# Patient Record
Sex: Male | Born: 1935
Health system: Southern US, Community
[De-identification: ages and names within clinical notes are randomized; demographics above are authoritative.]

## PROBLEM LIST (undated history)

## (undated) DIAGNOSIS — Z9289 Personal history of other medical treatment: Secondary | ICD-10-CM

## (undated) DIAGNOSIS — Z952 Presence of prosthetic heart valve: Secondary | ICD-10-CM

## (undated) DIAGNOSIS — Z87442 Personal history of urinary calculi: Secondary | ICD-10-CM

## (undated) DIAGNOSIS — K5790 Diverticulosis of intestine, part unspecified, without perforation or abscess without bleeding: Secondary | ICD-10-CM

## (undated) DIAGNOSIS — Z298 Encounter for other specified prophylactic measures: Secondary | ICD-10-CM

## (undated) DIAGNOSIS — M47817 Spondylosis without myelopathy or radiculopathy, lumbosacral region: Secondary | ICD-10-CM

## (undated) DIAGNOSIS — M069 Rheumatoid arthritis, unspecified: Secondary | ICD-10-CM

## (undated) DIAGNOSIS — N1832 Chronic kidney disease, stage 3b: Secondary | ICD-10-CM

## (undated) DIAGNOSIS — Z953 Presence of xenogenic heart valve: Secondary | ICD-10-CM

## (undated) DIAGNOSIS — I351 Nonrheumatic aortic (valve) insufficiency: Secondary | ICD-10-CM

## (undated) DIAGNOSIS — Z950 Presence of cardiac pacemaker: Secondary | ICD-10-CM

## (undated) DIAGNOSIS — I2510 Atherosclerotic heart disease of native coronary artery without angina pectoris: Secondary | ICD-10-CM

## (undated) DIAGNOSIS — Z2989 Encounter for other specified prophylactic measures: Secondary | ICD-10-CM

## (undated) DIAGNOSIS — K922 Gastrointestinal hemorrhage, unspecified: Secondary | ICD-10-CM

## (undated) DIAGNOSIS — M12819 Other specific arthropathies, not elsewhere classified, unspecified shoulder: Secondary | ICD-10-CM

## (undated) DIAGNOSIS — K219 Gastro-esophageal reflux disease without esophagitis: Secondary | ICD-10-CM

## (undated) DIAGNOSIS — T8209XA Other mechanical complication of heart valve prosthesis, initial encounter: Secondary | ICD-10-CM

## (undated) DIAGNOSIS — I9789 Other postprocedural complications and disorders of the circulatory system, not elsewhere classified: Secondary | ICD-10-CM

## (undated) DIAGNOSIS — I350 Nonrheumatic aortic (valve) stenosis: Secondary | ICD-10-CM

## (undated) DIAGNOSIS — I10 Essential (primary) hypertension: Secondary | ICD-10-CM

## (undated) DIAGNOSIS — J189 Pneumonia, unspecified organism: Secondary | ICD-10-CM

## (undated) DIAGNOSIS — I509 Heart failure, unspecified: Secondary | ICD-10-CM

## (undated) DIAGNOSIS — D649 Anemia, unspecified: Secondary | ICD-10-CM

## (undated) DIAGNOSIS — R1310 Dysphagia, unspecified: Secondary | ICD-10-CM

## (undated) DIAGNOSIS — D589 Hereditary hemolytic anemia, unspecified: Secondary | ICD-10-CM

## (undated) DIAGNOSIS — K222 Esophageal obstruction: Secondary | ICD-10-CM

## (undated) DIAGNOSIS — M5481 Occipital neuralgia: Secondary | ICD-10-CM

## (undated) DIAGNOSIS — I4892 Unspecified atrial flutter: Secondary | ICD-10-CM

## (undated) DIAGNOSIS — I4891 Unspecified atrial fibrillation: Secondary | ICD-10-CM

## (undated) DIAGNOSIS — I5032 Chronic diastolic (congestive) heart failure: Secondary | ICD-10-CM

## (undated) DIAGNOSIS — K635 Polyp of colon: Secondary | ICD-10-CM

## (undated) DIAGNOSIS — D61818 Other pancytopenia: Secondary | ICD-10-CM

## (undated) DIAGNOSIS — M249 Joint derangement, unspecified: Secondary | ICD-10-CM

## (undated) DIAGNOSIS — I340 Nonrheumatic mitral (valve) insufficiency: Secondary | ICD-10-CM

## (undated) DIAGNOSIS — D696 Thrombocytopenia, unspecified: Secondary | ICD-10-CM

## (undated) HISTORY — PX: LUMBAR LAMINECTOMY: SHX95

## (undated) HISTORY — DX: Presence of cardiac pacemaker: Z95.0

## (undated) HISTORY — PX: APPENDECTOMY: SHX54

## (undated) HISTORY — PX: COLON RESECTION: SHX5231

## (undated) HISTORY — DX: Unspecified atrial fibrillation: I48.91

## (undated) HISTORY — DX: Other postprocedural complications and disorders of the circulatory system, not elsewhere classified: I97.89

## (undated) HISTORY — DX: Unspecified atrial flutter: I48.92

## (undated) HISTORY — PX: CORONARY ANGIOPLASTY: SHX604

## (undated) HISTORY — DX: Atherosclerotic heart disease of native coronary artery without angina pectoris: I25.10

## (undated) HISTORY — DX: Nonrheumatic mitral (valve) insufficiency: I34.0

## (undated) HISTORY — DX: Diverticulosis of intestine, part unspecified, without perforation or abscess without bleeding: K57.90

## (undated) HISTORY — DX: Gastrointestinal hemorrhage, unspecified: K92.2

## (undated) HISTORY — DX: Nonrheumatic aortic (valve) stenosis: I35.0

## (undated) HISTORY — DX: Rheumatoid arthritis, unspecified: M06.9

## (undated) HISTORY — DX: Other mechanical complication of heart valve prosthesis, initial encounter: T82.09XA

## (undated) HISTORY — DX: Encounter for other specified prophylactic measures: Z29.8

## (undated) HISTORY — DX: Esophageal obstruction: K22.2

## (undated) HISTORY — PX: CARDIAC CATHETERIZATION: SHX172

## (undated) HISTORY — DX: Polyp of colon: K63.5

## (undated) HISTORY — PX: OTHER SURGICAL HISTORY: SHX169

## (undated) HISTORY — PX: TONSILLECTOMY: SUR1361

## (undated) HISTORY — PX: CATARACT EXTRACTION W/ INTRAOCULAR LENS  IMPLANT, BILATERAL: SHX1307

## (undated) HISTORY — PX: HERNIA REPAIR: SHX51

## (undated) HISTORY — DX: Occipital neuralgia: M54.81

## (undated) HISTORY — DX: Encounter for other specified prophylactic measures: Z29.89

## (undated) HISTORY — DX: Spondylosis without myelopathy or radiculopathy, lumbosacral region: M47.817

## (undated) HISTORY — DX: Presence of xenogenic heart valve: Z95.3

## (undated) HISTORY — DX: Chronic kidney disease, stage 3b: N18.32

---
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
## 1997-12-25 ENCOUNTER — Ambulatory Visit (HOSPITAL_COMMUNITY): Admission: RE | Admit: 1997-12-25 | Discharge: 1997-12-25 | Payer: Self-pay | Admitting: Neurological Surgery

## 1998-01-13 ENCOUNTER — Ambulatory Visit (HOSPITAL_COMMUNITY): Admission: RE | Admit: 1998-01-13 | Discharge: 1998-01-13 | Payer: Self-pay | Admitting: Neurological Surgery

## 1998-05-06 ENCOUNTER — Ambulatory Visit (HOSPITAL_COMMUNITY): Admission: RE | Admit: 1998-05-06 | Discharge: 1998-05-06 | Payer: Self-pay | Admitting: Neurological Surgery

## 1998-05-24 ENCOUNTER — Inpatient Hospital Stay (HOSPITAL_COMMUNITY): Admission: RE | Admit: 1998-05-24 | Discharge: 1998-05-26 | Payer: Self-pay | Admitting: Neurological Surgery

## 1998-05-24 ENCOUNTER — Encounter: Payer: Self-pay | Admitting: Neurological Surgery

## 1998-10-28 ENCOUNTER — Ambulatory Visit (HOSPITAL_COMMUNITY): Admission: RE | Admit: 1998-10-28 | Discharge: 1998-10-28 | Payer: Self-pay | Admitting: Neurological Surgery

## 1998-10-28 ENCOUNTER — Encounter: Payer: Self-pay | Admitting: Neurological Surgery

## 1999-02-01 ENCOUNTER — Inpatient Hospital Stay (HOSPITAL_COMMUNITY): Admission: RE | Admit: 1999-02-01 | Discharge: 1999-02-04 | Payer: Self-pay | Admitting: Neurological Surgery

## 1999-02-01 ENCOUNTER — Encounter: Payer: Self-pay | Admitting: Neurological Surgery

## 1999-07-22 ENCOUNTER — Encounter: Admission: RE | Admit: 1999-07-22 | Discharge: 1999-07-22 | Payer: Self-pay | Admitting: General Surgery

## 1999-07-22 ENCOUNTER — Encounter: Payer: Self-pay | Admitting: General Surgery

## 1999-07-25 ENCOUNTER — Encounter (INDEPENDENT_AMBULATORY_CARE_PROVIDER_SITE_OTHER): Payer: Self-pay | Admitting: *Deleted

## 1999-07-25 ENCOUNTER — Ambulatory Visit (HOSPITAL_BASED_OUTPATIENT_CLINIC_OR_DEPARTMENT_OTHER): Admission: RE | Admit: 1999-07-25 | Discharge: 1999-07-25 | Payer: Self-pay | Admitting: General Surgery

## 1999-09-07 ENCOUNTER — Encounter: Admission: RE | Admit: 1999-09-07 | Discharge: 1999-09-07 | Payer: Self-pay | Admitting: Neurological Surgery

## 1999-09-07 ENCOUNTER — Encounter: Payer: Self-pay | Admitting: Neurological Surgery

## 2002-02-01 ENCOUNTER — Encounter: Payer: Self-pay | Admitting: Emergency Medicine

## 2002-02-01 ENCOUNTER — Encounter (INDEPENDENT_AMBULATORY_CARE_PROVIDER_SITE_OTHER): Payer: Self-pay | Admitting: Specialist

## 2002-02-02 ENCOUNTER — Encounter: Payer: Self-pay | Admitting: Emergency Medicine

## 2002-02-02 ENCOUNTER — Encounter: Payer: Self-pay | Admitting: General Surgery

## 2002-02-02 ENCOUNTER — Inpatient Hospital Stay (HOSPITAL_COMMUNITY): Admission: EM | Admit: 2002-02-02 | Discharge: 2002-02-11 | Payer: Self-pay | Admitting: Emergency Medicine

## 2002-02-03 ENCOUNTER — Encounter: Payer: Self-pay | Admitting: General Surgery

## 2002-06-18 ENCOUNTER — Inpatient Hospital Stay (HOSPITAL_COMMUNITY): Admission: RE | Admit: 2002-06-18 | Discharge: 2002-06-26 | Payer: Self-pay | Admitting: General Surgery

## 2002-06-18 ENCOUNTER — Encounter (INDEPENDENT_AMBULATORY_CARE_PROVIDER_SITE_OTHER): Payer: Self-pay | Admitting: *Deleted

## 2002-06-23 ENCOUNTER — Encounter: Payer: Self-pay | Admitting: General Surgery

## 2003-07-27 ENCOUNTER — Ambulatory Visit (HOSPITAL_COMMUNITY): Admission: RE | Admit: 2003-07-27 | Discharge: 2003-07-27 | Payer: Self-pay | Admitting: General Surgery

## 2003-07-31 ENCOUNTER — Encounter: Admission: RE | Admit: 2003-07-31 | Discharge: 2003-07-31 | Payer: Self-pay | Admitting: General Surgery

## 2003-09-07 ENCOUNTER — Observation Stay (HOSPITAL_COMMUNITY): Admission: RE | Admit: 2003-09-07 | Discharge: 2003-09-08 | Payer: Self-pay | Admitting: General Surgery

## 2006-01-03 ENCOUNTER — Encounter: Payer: Self-pay | Admitting: Neurological Surgery

## 2008-01-03 HISTORY — PX: CARDIAC VALVE REPLACEMENT: SHX585

## 2008-01-10 ENCOUNTER — Encounter: Admission: RE | Admit: 2008-01-10 | Discharge: 2008-01-10 | Payer: Self-pay | Admitting: Cardiology

## 2008-01-13 ENCOUNTER — Ambulatory Visit: Payer: Self-pay | Admitting: Cardiothoracic Surgery

## 2008-01-13 ENCOUNTER — Encounter: Payer: Self-pay | Admitting: Cardiothoracic Surgery

## 2008-01-14 ENCOUNTER — Inpatient Hospital Stay (HOSPITAL_COMMUNITY): Admission: AD | Admit: 2008-01-14 | Discharge: 2008-01-18 | Payer: Self-pay | Admitting: Cardiology

## 2008-01-14 ENCOUNTER — Encounter: Payer: Self-pay | Admitting: Cardiothoracic Surgery

## 2008-01-16 DIAGNOSIS — Z953 Presence of xenogenic heart valve: Secondary | ICD-10-CM

## 2008-01-16 HISTORY — DX: Presence of xenogenic heart valve: Z95.3

## 2008-02-20 ENCOUNTER — Encounter: Admission: RE | Admit: 2008-02-20 | Discharge: 2008-02-20 | Payer: Self-pay | Admitting: Cardiothoracic Surgery

## 2008-02-20 ENCOUNTER — Ambulatory Visit: Payer: Self-pay | Admitting: Cardiothoracic Surgery

## 2008-05-15 ENCOUNTER — Ambulatory Visit (HOSPITAL_COMMUNITY): Admission: RE | Admit: 2008-05-15 | Discharge: 2008-05-15 | Payer: Self-pay | Admitting: Otolaryngology

## 2011-01-17 NOTE — Discharge Summary (Signed)
Joshua Hudson, TANIMOTO NO.:  [id number]   MEDICAL RECORD NO.:  [id number]          PATIENT TYPE:  INP   LOCATION:  2019                         FACILITY:  [facility]   PHYSICIAN:  Sheliah Plane, MD    DATE OF BIRTH:  06/07/1935   DATE OF ADMISSION:  01/13/2008  DATE OF DISCHARGE:  01/18/2008                               DISCHARGE SUMMARY   HISTORY OF PRESENT ILLNESS:  The patient is a 76 year old white male  with a history of multiple medical conditions including rheumatoid  arthritis, aortic valve stenosis, degenerative disk disease of the  lumbosacral spine, occipital neuralgia, and diverticulosis as well as  hyperlipidemia.  The patient had a known aortic valve problem in the  past.  His last echocardiogram in 2005 showed moderate aortic stenosis.  More recently, a 2-D echocardiogram was done, when he presented to his  primary physician with increasing dyspnea on exertion as well as  profound fatigue limiting his daily activities.  This study showed  severe aortic stenosis with a peak instantaneous gradient of 91 mmHg,  mean gradient of 52 mmHg, and the aortic valve area of 0.8 cm squared.  Left ventricular function was normal with an ejection fraction of 70%.  No significant pulmonary hypertension was noted.  The patient has not  had any syncope, but has had dizzy spells.  Additionally, the patient  did get some symptoms of chest pressure with exertion.  He was referred  to Dr. Armanda Magic for further cardiology evaluation to include cardiac  catheterization.  He was admitted to this hospitalization for the  procedure.   PAST MEDICAL HISTORY:  Includes Seronegative rheumatoid arthritis, non-  nodular, non-erosive, followed by Dr. Coral Spikes.   OTHER DIAGNOSES:  Include:  1. Severe aortic stenosis possible bicuspid aortic valve.  2. Degenerative joint disease of lumbosacral spine.  3. History of occipital neuralgia.  4. History of an adenomatous colon polyp.  5. History of colonic perforation, post colonoscopy.  6. History of diverticulosis.  7. History of mild hyperlipidemia.   PAST SURGICAL HISTORY:  Includes:  1. C3-C4 cervical fusion.  2. Appendectomy.  3. Tonsillectomy.  4. Transurethral resection of the prostate.  5. Cystoscopy with laser incision in the bladder neck.  6. Kidney stone removal in 1997.  7. History of right AC joint shoulder surgery.  8. History of laparotomies following a colon perforation in 2002 with      takedown colostomy in 2003, and a repair of abdominal hernia in      2005.   ALLERGIES:  None.   MEDICATIONS PRIOR TO ADMISSION:  Included:  1. Prednisone 5 mg daily.  2. Methotrexate 15 mg weekly.  3. Folic acid 1 mg daily.  4. Multivitamin 1 daily.  5. Aspirin 81 mg daily.   Family history, social history, review of symptoms, and physical exam,  please see the history and physical done at the time of admission.   HOSPITAL COURSE:  The patient was admitted on Jan 13, 2008 and taken to  the operating room, where he underwent a right heart catheterization  with coronary angiography.  Left heart catheterization was attempted,  but they were unable to cross the aortic valve.  Findings were consistent with:  1. Nonobstructive coronary disease.  2. Severe aortic stenosis by echocardiogram.  3. Normal left ventricular function by echocardiogram.  4. Normal right heart pressures.   The patient was then referred to Sheliah Plane, MD, for thoracic  surgical consultation for consideration of aortic valve replacement.  Dr. Tyrone Sage evaluated the patient and studies and agreed with the  recommendations to proceed.   PROCEDURE:  On Jan 14, 2008, the patient was taken to the operating room  and underwent the following procedure of aortic valve replacement with a  #25 mm [employer] pericardial tissue valve.  The patient  tolerated the procedure and was taken to the surgical intensive care  unit in  stable condition.   POSTOPERATIVE HOSPITAL COURSE:  The patient has overall done well.  He  was weaned from ionotropic support without difficulty.  He was weaned  from the ventilator and extubated uneventfully.  All routine lines were  monitored.  Drainage devices were discontinued in the standard fashion.  Laboratory values did reveal a moderate postoperative anemia.  Most  recent hematocrit dated Jan 17, 2008 was 25.8.  Electrolytes, BUN, and  creatinine are within normal limits.  The patient did have episode of  postoperative atrial fibrillation and has subsequently been chemically  cardioverted back to normal sinus rhythm with amiodarone and Lopressor.  Oxygen has been weaned and he maintains good saturations on room air.  He is tolerating routine advancement activities using standard  protocols.  Incision is healing well without evidence of infection.  Overall, the patient's status is felt to be tentatively stable for  discharge on the morning on Jan 18, 2008, pending morning round  reevaluation.   INSTRUCTIONS:  The patient received written instructions regarding  medications, activity, diet, wound care, and followup.  Followup will  include Dr. Armanda Magic 2 weeks post discharge.  An appointment is also  arranged for Dr. Tyrone Sage on February 20, 2008 at 9:30 with a chest x-ray at  that time.   MEDICATIONS AT DISCHARGE:  As follows:  1. Prednisone 5 mg daily.  2. Folic acid 1 mg daily.  3. Multivitamin 1 daily.  4. Aspirin 325 mg daily.  5. Toprol-XL 25 mg daily.  6. Tylox 1-2 q.6 hours p.r.n. as needed for pain.  7. Amiodarone 400 mg twice daily for 10 days, then once daily.   FINAL DIAGNOSES:  Include the following:  1. Severe aortic stenosis, now status post aortic valve replacement.   As described, other diagnoses include:  1. Postoperative atrial fibrillation.  2. Postoperative acute blood loss anemia.   Previous diagnoses listed including:  1. Rheumatoid arthritis.   2. Degenerative joint disease of the lumbosacral spine.  3. History of occipital neurologia.  4. History of colon polyps.  5. History of colonic perforation.  6. History of diverticulosis.  7. History of mild hyperlipidemia.   Surgeries as previously listed above.      Rowe Clack, P.A.-C.      Sheliah Plane, MD  Electronically Signed    WEG/MEDQ  D:  01/17/2008  T:  01/18/2008  Job:  [id number]   cc:   Armanda Magic, M.D.  Sheliah Plane, MD  Demetria Pore. Coral Spikes, M.D.  Thora Lance, M.D.

## 2011-01-17 NOTE — Consult Note (Signed)
Joshua Hudson, FERTIG NO.:  [id number]   MEDICAL RECORD NO.:  [id number]          PATIENT TYPE:  OIB   LOCATION:  2855                         FACILITY:  [facility]   PHYSICIAN:  Sheliah Plane, MD    DATE OF BIRTH:  [birth year]   DATE OF CONSULTATION:  01/13/2008  DATE OF DISCHARGE:                                 CONSULTATION   REQUESTING PHYSICIAN:  Dr. Mayford Knife.   FOLLOWUP CARDIOLOGIST:  Dr. Mayford Knife.   PRIMARY CARE PHYSICIAN:  Dr. Kirby Funk.   RHEUMATOLOGIST:  Dr. Lennox Pippins.   REASON FOR CONSULTATION:  Severe aortic stenosis with rapidly  progressing presyncope, dyspnea with very minimal exertion, and fatigue.  The patient has had a known murmur of aortic stenosis for years.  Echocardiogram had been done in 2005, but we do not have the results of  this.  The patient notes that probably for at least the last six and  probably 8 months, he has had progressive downhill course with  significant nocturnal dyspnea, now sleeping on this sofa, sitting up  because he is unable to lay flat, increasing episodes of dizziness,  close to syncope, though he has never actually passed out.  He notes, in  January, an echocardiogram was performed at the [facility] office, that  showed severe aortic stenosis with velocity across the aortic valve of  greater than 4 meters per second.  Appointment was made for him to see  Dr. Mayford Knife, on Jan 10, 2008, and the patient was admitted today for  cardiac catheterization and consideration of aortic valve replacement.  He has had no previous myocardial infarction, no previous angioplasty,  or previous cardiac surgery.  The January echo, as noted above, shows  severe aortic stenosis with estimated peak gradient of 91 mm, mean of  52, and aortic valve area 0.8, with preserved LV function.  The patient  has no history of hypertension.  No history of diabetes.  Does have a  history of hyperlipidemia.  Remote history of smoking, but quit in  1995.   FAMILY HISTORY:  Significant for his father who died at age 54 of  myocardial infarction.  Mother died at 48 with dementia. One sister had  a cardiac transplant.  The patient has had no previous history of  stroke, complaint of renal insufficiency or claudication.   PAST MEDICAL HISTORY:  Significant for greater than 20 years of being  treated for rheumatoid arthritis, initially with steroids and now for  the past 4-5 years with prednisone and methotrexate.   SURGICAL HISTORY:  Complicated.  1. In 2002, the patient had a colostomy, following a colonic      perforation. He had a polyp excised from his colon and a week later      had a spontaneous colonic perforation and underwent emergency      colostomy.  2. In 2003, he had the colostomy taken down.  3. In 2005, repair of abdominal hernia.  4. He has had a history of C3-C4 cervical fusion.  5. History of appendectomy.  6. Tonsillectomy.  7. TURP.  8. Cystoscopy, with laser  excision of bladder neck.  9. Kidney stone removal in 1997.   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  Lives with his wife.   MEDICATIONS:  1. Prednisone 5 mg a day, for which he has been taking more than 20      years.  2. Methotrexate 15 mg weekly, on Mondays.  He did not take it this      week.  3. Folic acid 1 mg a day.  4. Multivitamin daily.  5. Aspirin 81 mg a day.   DRUG ALLERGIES:  None.   CARDIAC VIEW OF SYSTEMS:  Positive for chest pressure with shortness of  breath with minimal exertion, presyncope.  Denies syncope.  Has had  history of palpitations.  Denies lower extremity edema.   GENERAL REVIEW OF SYSTEMS:  The patient does have constitutional  symptoms, primarily complained of fatigue.  He denies fever, chills, or  night sweats.  He does see a dentist on a regular basis, last time was a  year and half ago.  He denies any current dental problems.  NEUROLOGIC:  He does have  occasional headaches.  He has degenerative joint disease of  lumbar spine.  GENITOURINARY:  Symptoms, as noted above.  Currently, he  denies any problems with voiding.  Denies psychiatric history.  Denies  diabetes.   PHYSICAL EXAM:  VITAL SIGNS:  Blood pressure 112/73, pulse is 62, and  respiratory rate is 20.  O2 sats on room air is 98%.  The patient is 74  inches tall, weighs 64 kg.  GENERAL:  The patient is awake, alert, and neurologically intact.  He  has no carotid bruits.  HEART:  He has a harsh holosystolic murmur consistent with aortic  stenosis, heard throughout the precordium.  I do not appreciate any  murmur, mitral insufficiency.  ABDOMEN:  Previous incisions from his exploratory lap and repair and  takedown of his colostomy.  EXTREMITIES:  He has 2+ DP and PT pulses, bilaterally.   LABORATORY FINDINGS:  His white count is 7.2, hematocrit is 37.6, BUN is  23, and creatinine is 1.  Cardiac catheterization, the valve was not  crossed.  His pulmonary pressures were 21/8.  Cardiac index was 3.7.   IMPRESSION:  The patient who is with a very severe aortic stenosis with  progressing symptoms and a valve area well into the range of critical  stenosis in January, agreed with the recommendation to proceed with  aortic valve replacement.  Ideally it would be nice to wait and have the  patient off of methotrexate; however, in talking to the patient and his  wife, over the past 6 weeks he said, he has become much more  symptomatic, barely able to walk around his house without getting short  of breath, so I have recommended that we proceed with aortic valve  replacement.  We discussed the various types of valves that we could use  with the patient's complicated medical history, ideally, affording the  use of Coumadin would be preferred.  The patient is agreeable to proceed  with a tissue  valve.  The risks of surgery, including death, infection, stroke,  myocardial infarction,  bleeding, and blood transfusion, all discussed  with the patient in detail and he is willing to proceed.   PLAN:  To proceed with surgery, on Jan 14, 2008.      Sheliah Plane, MD  Electronically Signed     EG/MEDQ  D:  01/13/2008  T:  01/14/2008  Job:  [id number]   cc:   Armanda Magic, M.D.

## 2011-01-17 NOTE — Cardiovascular Report (Signed)
NAMEAZAVION, BOUILLON NO.:  [id number]   MEDICAL RECORD NO.:  [id number]          PATIENT TYPE:  OIB   LOCATION:  2029                         FACILITY:  [facility]   PHYSICIAN:  Armanda Magic, M.D.     DATE OF BIRTH:  Apr 12, 1935   DATE OF PROCEDURE:  DATE OF DISCHARGE:                            CARDIAC CATHETERIZATION   REFERRING PHYSICIAN:  Thora Lance, MD   PROCEDURES:  Right heart catheterization and coronary angiography.  Left  heart catheterization was attempted, but could not cross the aortic  valve.   OPERATOR:  Armanda Magic, MD   INDICATIONS:  Chest pain, shortness of breath, and severe aortic  stenosis by echocardiogram.   COMPLICATIONS:  None.   IV MEDICATIONS:  1. Versed 1 mg.  2. Fentanyl 25 mcg IV.   IV access via right femoral artery 6-French sheath and right femoral  vein 7-French sheath.   PROCEDURE:  This is a 76 year old gentleman with a history recently of  progressive shortness of breath, fatigue, and chest pain as well as some  dizzy spells.  He was found to have severe aortic stenosis by  echocardiogram and now presents for cardiac catheterization.   The patient was brought to the cardiac catheterization laboratory in the  fasting nonsedated state.  Informed consent was obtained.  The patient  was connected to continuous heart rate and pulse oximetry monitoring,  underwent blood pressure monitoring.  The right groin was prepped and  draped in a sterile fashion.  1% Xylocaine was used for local  anesthesia.  Using modified Seldinger technique a 7-French sheath was  placed in the right femoral vein.   Under fluoroscopic guidance, a 7-French Swan-Ganz catheter was placed  via balloon flotation into the right atrium.  Right atrial pressure was  measured and right atrial O2 saturations were obtained.  The catheter  was then guided into the right ventricle, right ventricular pressure was  measured.  The catheter was then guided into  the pulmonary artery and  allowed to float into the wedge position.  Pulmonary capillary wedge  pressure was measured.  The balloon was deflated.  The catheter was  pulled back into the pulmonary artery and pulmonary artery pressure was  measured as well as pulmonary artery O2 saturations.  Thermal dilutions  were then obtained using 10 mL of saline on each consecutive injection  of saline for four different injections.  The catheter was then removed  under fluoroscopy.  O2 saturations were also obtained at that time.   Under fluoroscopic guidance a 6-French JL4 catheter was placed in left  coronary artery.  Multiple cine films were taken at 30-degree RAO and 40-  degree LAO views.  This catheter was then exchanged out over a guidewire  for a 6-French JR4 catheter, which was placed under fluoroscopic  guidance in the right coronary artery.  Multiple cine films were taken  at 30-degree RAO and 40-degree LAO views.  This catheter was exchanged  out over a guidewire for 6-French angled pigtail catheter which probed  the aortic valve, but could not cross the aortic valve due  to heavy  calcification, the catheter was removed over a guide wire.  At the end  procedure, all catheters and sheaths were removed.  Manual compression  was performed until adequate hemostasis was obtained.  The patient was  transferred back to room in stable condition.   RESULTS:  Right heart cath data.  Right atrial pressure 5/4 with a mean  of 3 mmHg.  RV pressure 23/2 with a mean of 2 mmHg.  PA pressure 21/8  with a mean of 13 mmHg.  Pulmonary capillary wedge 9/8 with a mean of 6  mmHg.   O2 saturations.  Aorta 95%, RA 64%, PA 66%, cardiac output by Fick was  4.46 and by thermal dilution 6.23, cardiac index by Fick 2.67, by  thermal dilution 3.73.  Left ventricular and aortic pressure were not  obtained, because unable to cross the aortic valve due to heavy  calcification.   The left main coronary artery is  widely patent and bifurcates to left  anterior descending artery and left circumflex artery.   Left anterior descending artery is widely patent throughout the course.  The apex giving rise to diagonal branches both of which are widely  patent.   The left circumflex is widely patent throughout its course in the AV  groove.  It gives rise to a small obtuse marginal branch and just before  the takeoff of a small obtuse marginal-2 branch there is a 20% to 30%  eccentric narrowing.  The ongoing circumflex traverses the AV groove  giving rise to a third large obtuse marginal-3 branch which bifurcates  into daughter branches and widely patent.   The right coronary artery is widely patent throughout its course and  distally bifurcates into posterior descending artery posterior lateral  artery both of which are widely patent.   ASSESSMENT:  1. Nonobstructive coronary disease.  2. Severe aortic stenosis by echocardiogram.  3. Normal left ventricular function by echocardiogram.  4. Normal right heart pressures.   PLAN:  A CVTS consult for symptomatic aortic stenosis as an outpatient.  We will discharge to home today after IV fluid and bedrest.      Armanda Magic, M.D.  Electronically Signed     TT/MEDQ  D:  01/13/2008  T:  01/14/2008  Job:  [id number]   cc:   Thora Lance, M.D.

## 2011-01-17 NOTE — Assessment & Plan Note (Signed)
OFFICE VISIT   Joshua Hudson, Joshua Hudson  DOB:  1935/03/16                                        February 20, 2008  CHART #:  [id number]   The patient returns to the office today in followup after his aortic  valve replacement with a pericardial tissue valve 25 mm done on  01/16/2008.  At that time, the patient presented with 6 months of  rapidly progressing congestive heart failure symptoms and angina and was  found to have critical aortic stenosis.  He underwent urgent aortic  valve replacement.  Postoperatively, he had some intermittent atrial  fibrillation, which was transient and ultimately was discharged home on  aspirin and amiodarone and a beta blocker.  He has done well since  discharge.  He has had no recurrent angina.  His physical activity has  increased appropriately without any evidence of congestive heart  failure.   PHYSICAL EXAMINATION:  His blood pressure 124/70, pulse is 55,  respiratory rate 20, and O2 sat is 99%.  His sternum is stable and  healing well.  Do not appreciate any murmur or aortic insufficiency.  He  has no pedal edema.   Followup chest x-ray done in [facility] shows clear lung fields bilaterally.   The patient currently is taking aspirin 325 mg a day, Toprol 25 mg a  day.  He was discharged home on amiodarone 400 mg, this has been  decreased to 200 mg a day; he has approximately 3-4 weeks of supply  left.  Also, on prednisone 5 mg a day, folic acid 1 mg a day, and  multivitamin daily.  He had been on methotrexate preoperatively; this  was held and is continuing be held until he has healed his wounds.  He  is to see Dr. Coral Hudson in July, and dependent on his arthritis symptoms,  he could probably resume the methotrexate without any difficulty with  his current wound healing.  I have asked him to check with Dr. Mayford Knife  when his current prescription of amiodarone is complete, as he has been  on it for approximately 2 months postoperatively  and may consider  discontinuing at that time, as he has had no evidence of atrial  fibrillation.   Overall, I am very pleased with his progress.  I will see him back  p.r.n. at Dr. Norris Cross request.   Joshua Plane, MD  Electronically Signed   EG/MEDQ  D:  02/20/2008  T:  02/20/2008  Job:  [id number]   cc:   Joshua Hudson, M.D.  Joshua Hudson, M.D.

## 2011-01-17 NOTE — Op Note (Signed)
NAMEYONG, GRIESER NO.:  [id number]   MEDICAL RECORD NO.:  [id number]          PATIENT TYPE:  INP   LOCATION:  2019                         FACILITY:  [facility]   PHYSICIAN:  Sheliah Plane, MD    DATE OF BIRTH:  [birth year]   DATE OF PROCEDURE:  DATE OF DISCHARGE:                               OPERATIVE REPORT   PREOPERATIVE DIAGNOSIS:  Critical aortic stenosis, probable bicuspid  aortic valve.   POSTOPERATIVE DIAGNOSIS:  Critical aortic stenosis, probable bicuspid  aortic valve.   SURGICAL PROCEDURES:  Severe aortic stenosis, calcific aortic stenosis,  and a bicuspid aortic valve.   PROCEDURE PERFORMED:  Aortic valve replacement with a pericardial tissue  aortic valve, [employer], serial number [id number], 25 mm, model  3000.   SURGEON:  Sheliah Plane, MD   FIRST ASSISTANT:  Doree Fudge, PA   BRIEF HISTORY:  The patient is a 76 year old male with rheumatoid  arthritis on methotrexate and prednisone who has presented with  increasing fatigue for several months.  The patient had an  echocardiogram in January of this year, which revealed evidence of  severe aortic stenosis.  He then presented to his medical doctor with  persistent symptoms of congestive heart failure and ultimately had  cardiac catheterization done by Dr. Mayford Knife, which confirmed severe  aortic stenosis with aortic valve velocities of greater than 5 cm/sec.  Because of the patient's significant symptoms to the point where he was  barely able to walk around his house, proceeding earlier rather than  later with aortic valve replacement was recommended.  The patient agreed  and signed informed consent.   DESCRIPTION OF PROCEDURE:  With Swan-Ganz and arterial line monitors in  place, the patient underwent general endotracheal anesthesia without  incidence.  The chest and legs was prepped with Betadine and draped in  the usual sterile manner.  A median sternotomy was performed.   The  pericardium was opened.  The patient had evidence of left ventricular  hypertrophy.  He was systemically heparinized.  The ascending aorta was  cannulated.  The right atrium was cannulated.  It should be noted that  the ascending aorta was absolute normal size, in spite of having a  bicuspid valve.  The patient was placed on cardiopulmonary bypass.  The  right superior pulmonary vein vent was placed.  The patient's body  temperature was cooled to 30 degrees.  Aortic crossclamp was applied and  600 mL of cold blood potassium cardioplegia was administered with  diastolic arrest of the heart.  Myocardial septal temperatures were  monitored throughout the crossclamp.  A transverse aortotomy was  performed on the ascending aorta above the aortic valve and it gave good  visualization of severe calcified bicuspid aortic valve.  The valve was  excised and annulus sized for 25 pericardial tissue valve [employer], serial number [id number], 25 mm, model 3000.  After  significant decalcification of the annulus and #2 Ti-Cron pledgeted  sutures with pledgets on the ventricular surface were placed.  Circumferentially, the valve was then secured in place with pledgeted  sutures  and seated well.  Care was taken to remove all loose calcific  debris.  The ascending aorta was then closed with horizontal mattress 3-  0 Prolene suture over felt strips.  The heart was allowed to fill and  passively deair through the ascending aortic root vent.  Intermittently  during the procedure, cold blood cardioplegia was administered directly  into the coronaries.  With the removal of the crossclamp after 84  minutes, the patient spontaneously converted to a sinus rhythm.  Atrial  and ventricular pacing wires were applied.  He was transiently paced  atrially to increase his rate.  The aortotomy was free of bleeding.  He  was then ventilated and weaned off cardiopulmonary bypass without  difficulties,  decannulated in usual fashion.  Protamine sulfate was  administered with the operative field hemostatic.  Pericardium was  reapproximated.  Blake mediastinal drain was left in place.  Sternum was  closed with #6 stainless steel wire.  Fascia closed with interrupted 0-  Vicryl, running 3-0 Vicryl, subcutaneous tissue, and 4-0 subcuticular  stitch in skin edges.  Dry dressings were applied.  Sponge and needle  count was reported as correct at the completion of procedure.  The  patient tolerated the procedure without obvious complication and was  transferred to surgical intensive care unit for further postoperative  care.  Total pump time was 105 minutes.      Sheliah Plane, MD  Electronically Signed     EG/MEDQ  D:  01/16/2008  T:  01/17/2008  Job:  [id number]   cc:   Armanda Magic, M.D.

## 2011-01-20 NOTE — Op Note (Signed)
NAME:  Joshua Hudson, Joshua Hudson                         ACCOUNT NO.:  [id number]   MEDICAL RECORD NO.:  [id number]                   PATIENT TYPE:  INP   LOCATION:  5731                                 FACILITY:  [facility]   PHYSICIAN:  Angelia Mould. Derrell Lolling, M.D.             DATE OF BIRTH:  1935-08-31   DATE OF PROCEDURE:  06/18/2002  DATE OF DISCHARGE:                                 OPERATIVE REPORT   PREOPERATIVE DIAGNOSIS:  Functioning sigmoid colostomy, status post sigmoid  colectomy for diverticulitis.   POSTOPERATIVE DIAGNOSIS:  Functioning sigmoid colostomy, status post sigmoid  colectomy for diverticulitis.   OPERATION PERFORMED:  Resection and closure of colostomy.   SURGEON:  Angelia Mould. Derrell Lolling, M.D.   FIRST ASSISTANT:  Ollen Gross. Carolynne Edouard, M.D.   ANESTHESIA:  General endotracheal.   OPERATIVE INDICATIONS:  This is a 76 year old  white man with steroid-  dependent rheumatoid arthritis.  He presented to this hospital on February 02, 2002 with acute abdominal pain and was found to have perforated sigmoid  diverticulitis with peritonitis.  He underwent a sigmoid colon resection  with diverting colostomy.  He did recover uneventful.  His steroid doses  have been tapered somewhat.  He had a colonoscopy in March of this year,  which was normal, except for diverticulosis of the sigmoid colon.  I did a  flexible sigmoidoscopy s few days ago, which showed a fairly normal distal  rectal segment to about 20 cm.  He was brought to the operating room  electively after a bowel prep.   OPERATIVE TECHNIQUE:  Following the induction of general endotracheal a  Foley catheter was inserted.  The patient's abdomen was prepped and draped  in a sterile fashion.  Midline laparotomy was made and I excised the old  lower midline scar.  Dissection was carried down through the subcutaneous  tissue, through the fascia in the midline and we entered the abdominal  cavity under direct vision.  Some omental adhesions were  taken down from  under the incision and the omentum was retracted cephalad.  Small bowel  looked fairly normal.  Very minimal adhesions of the small bowel.  We were  able to mobilize the small bowel out of the pelvis quite nicely.  We  mobilized the rectal segment, which was quite long, and actually the closure  of the colon was right about at the sacral promontory.  We mobilized the  rectal stump away from the surrounding tissues.  We resected about 2 cm of  the rectal stump and opened it up, and the tissues of the rectum were quite  healthy, bled easily and looked healthy.  We placed stay sutures down here  and packed this off.   We then excised the colostomy.  A vertically oriented elliptical incision  was made around the colostomy and dissection was carried down through the  subcutaneous tissue.  We mobilized the colon away from  subcutaneous tissue  and the fascia.  We did this circumferentially until we actually entered the  abdomen and then returned the colostomy to the abdominal cavity, and then  further dissected it away from the abdominal wall.  We had a nice length of  healthy colon.  We mobilized the proximal sigmoid colon somewhat and wound  up resecting about 6-7 cm of the colostomy to freshen this up.  The  mesenteric vessels were isolated, clamped, divided and ligated with 2-0 silk  ties.   We set up the anastomosis with interrupted sutures of 3-0 silk in a single-  layer fashion.  Corner sutures were placed.  The posterior wall of the  anastomosis was performed with interrupted inverting sutures of 3-0 silk.  Inverting corner sutures were placed and then we completed the anterior row  of the anastomosis with interrupted inverting sutures of 3-0 silk.  We  examined this circumferentially and found that it looked good.   At this point we changed our instruments, our gloves and our suction  devices.  We irrigated out the abdomen and pelvis extensively.  The fascia  in the  left abdominal wall where the colostomy was was closed with about 10  interrupted sutures of #1 Novafil.  We then further examined the abdomen and  irrigated further, and found no bleeding and no abnormalities. The  anastomosis looked fine and lay without tension along the left pelvic wall.  The small bowel and omentum were returned their anatomic positions.  The  midline fascia was closed with a running sutures of #1 Novafil and we placed  five interrupted sutures of #1 Novafil as well.  The wounds were irrigated  with saline and both skin incisions were closed with skin staples.  Clean  bandages were placed.   The patient was taken to the recovery room in stable condition.   ESTIMATED BLOOD LOSS:  About 150 cc.   COMPLICATIONS:  None.   Sponge and instrument counts were correct.                                                 Angelia Mould. Derrell Lolling, M.D.    HMI/MEDQ  D:  06/18/2002  T:  06/19/2002  Job:  [id number]   cc:   Demetria Pore. Coral Spikes, M.D.  [address]  [address]  [place]  [place] [address]  Fax: [phone]

## 2011-01-20 NOTE — Discharge Summary (Signed)
NAME:  Joshua Hudson, Joshua Hudson                         ACCOUNT NO.:  [id number]   MEDICAL RECORD NO.:  [id number]                   PATIENT TYPE:  INP   LOCATION:  5741                                 FACILITY:  [facility]   PHYSICIAN:  Angelia Mould. Derrell Lolling, M.D.             DATE OF BIRTH:  1934/11/12   DATE OF ADMISSION:  06/18/2002  DATE OF DISCHARGE:  06/26/2002                                 DISCHARGE SUMMARY   FINAL DIAGNOSES:  1. Functioning sigmoid colostomy.  2. Status post perforated sigmoid diverticulitis with resection and     colostomy.  3. Rheumatoid arthritis on low dose steroids.  4. Status post lumbar laminectomy x2.  5. Status post right inguinal hernia.  6. Status post appendectomy.   OPERATION:  Resection and closure of colostomy on June 18, 2002.   HISTORY OF PRESENT ILLNESS:  This is a 76 year old white male who was  admitted to this hospital on February 02, 2002, with acute diverticulitis.  He  had a microperforation by CT scan.  He did not respond to antibiotics and  had to undergo sigmoid colectomy with colostomy.  He recovered from that  surgery.  He has recovered completely and feels well.  He has had his  steroid dose for his rheumatoid arthritis tapered back down to 5 mg a day.  He had had a colonoscopy back in March of this year which showed only  diverticulosis of the sigmoid colon.  The rest of the colon looked fine.  I  did a sigmoidoscopy in the office looking at the distal rectal stump and it  looked fine.  It was about 20 cm in length.  He underwent a bowel prep at  home and was admitted electively.   PHYSICAL EXAMINATION:  GENERAL APPEARANCE:  A thin, pleasant elderly  gentleman who appears fit for his age.  LUNGS:  Clear.  CARDIOVASCULAR:  ABDOMEN:  Soft.  There was a well-healed lower midline incision.  Healthy  colostomy in the left lower quadrant.   HOSPITAL COURSE:  On the day of admission the patient was taken to the  operating room and underwent  resection and closure of his colostomy.  We  reopened his lower midline incision, resected the colostomy and performed a  hand sewn end-to-end anastomosis of his proximal sigmoid colon to his  proximal rectum.  Surgery was uneventful.   Postoperatively, the patient did quite well and suffered no major  complications.  He did have some unusual abdominal pain which he described  as a burning sensation for several days.  Abdominal x-rays and a CBC were  normal.  We tried different analgesics and ultimately the pain essentially  resolved and he felt well, resumed a diet and began having normal bowel  function.  He was discharged on June 26, 2002, and tolerating a regular  diet having bowel movements, had no wound problems.   FOLLOW UP:  He was  asked to come back to the office in five to seven days  for staple removal.    DISCHARGE MEDICATIONS:  He was given a prescription for Vicodin for pain and  was told to continue his usual medications which include prednisone 5 mg a  day.                                                Angelia Mould. Derrell Lolling, M.D.    HMI/MEDQ  D:  07/09/2002  T:  07/10/2002  Job:  [id number]   cc:   Thora Lance, M.D.  [address]  [place]  [place] [address]  Fax: [phone]   Demetria Pore. Coral Spikes, M.D.  [address]  [address]  [place]  [place] [address]  Fax: [phone]

## 2011-01-20 NOTE — H&P (Signed)
[place]. [facility]  Patient:    Joshua Hudson, Joshua Hudson Visit Number: [id number] MRN: [id number]          Service Type: SUR Location: 5700 [id number] Attending Physician:  Brandy Hale Dictated by:   Angelia Mould. Derrell Lolling, M.D. Admit Date:  02/01/2002   CC:         Thora Lance, M.D.   History and Physical  CHIEF COMPLAINT: Abdominal pain.  HISTORY OF PRESENT ILLNESS: This is a 76 year old white man, who was feeling well until 4 p.m. yesterday, Feb 01, 2002.  At that time he noted the gradual onset of lower abdominal pain, initially more so in the right lower quadrant but now more so in the left lower quadrant.  The pain has been progressive and he actually has noted pain all over his abdomen but it is most intense in the left lower quadrant.  He denies nausea, vomiting, fever, chills, or diarrhea. His last bowel movement was yesterday morning and that was normal.  He has not seen any blood in his bowel movements.  He has been voiding a little bit more slowly than usual but the urine has looked clear and he has not had any other urinary symptoms.  He came to the emergency room and was evaluated by Dr. Cathren Laine.  CT scan shows sigmoid diverticulitis and scattered small amounts of free air.  There is no abscess or free fluid.  I was asked to see him.  PAST MEDICAL HISTORY:  1. He is status post appendectomy.  2. Status post right inguinal hernia repair.  3. Status post two lumbar back operations.  4. Status post one neck operation.  5. Rheumatoid arthritis.  CURRENT MEDICATIONS: Prednisone 5 mg q.d.  DRUG ALLERGIES: None known.  FAMILY HISTORY: Mother is living, age 64; she is blind.  Father deceased at age 45, had myocardial infarction, had arthritis, and had diverticulitis.  He has one brother who died of a suicide.  He has a total of seven living siblings, no major medical problems.  SOCIAL HISTORY: [place]  He is married.  They have two children.  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.  Denies the use of alcohol or tobacco.  REVIEW OF SYSTEMS: All systems are reviewed and are noncontributory except as described above.  PHYSICAL EXAMINATION:  GENERAL: Pleasant older gentleman, who is thin and appears fit for his age. He is mild to moderate distress from abdominal pain.  VITAL SIGNS: TEMP 97.7 degrees, pulse 95, respirations 20, blood pressure 130/82.  HEENT: Sclerae clear.  EOMI.  Oropharynx clear.  NECK: Supple, nontender.  No mass, no thyromegaly, no adenopathy, no bruits.  LUNGS: Clear to auscultation.  HEART: Regular rate and rhythm.  Faint systolic murmur.  ABDOMEN: Nondistended.  Bowel sounds hypoactive.  He is significantly tender with guarding in the left lower quadrant but no mass.  He has some right lower quadrant tenderness but less.  He is soft elsewhere, with a little bit of subjective tenderness elsewhere.  No hernia noted.  GU: Normal penis, scrotum, and testes.  EXTREMITIES: No edema.  Good pulses.  NEUROLOGIC: Grossly within normal limits.  LABORATORY DATA: Admission CT scan shows sigmoid diverticulitis and micro perforation, as above.  Hemoglobin 12.3, WBC 12,200.  Complete metabolic panel is essentially normal. Urinalysis is normal.  IMPRESSION:  1. Sigmoid diverticulitis with micro perforation, but no evidence of abscess  or diffuse parasitosis.  2. Rheumatoid arthritis, on low-dose steroids.  PLAN:  1. The patient will be admitted and started on broad-spectrum antibiotics.  2. He will be kept NPO with bowel rest.  3. I have advised the patient that we will initially treat him nonoperatively     but that if his condition deteriorates he might require emergent     sigmoid colectomy with colostomy.  4. He is advised that he may need elective sigmoid colectomy in the future     but that will  depend on clinical course and ultimate findings. Dictated by:   Angelia Mould. Derrell Lolling, M.D. Attending Physician:  Brandy Hale DD:  02/02/02 TD:  02/03/02 Job: [id number]

## 2011-01-20 NOTE — Op Note (Signed)
[place]. [facility]  Patient:    Joshua Hudson                       MRN: [id number] Proc. Date: 07/25/99 Adm. Date:  [id number] Attending:  Arlis Porta                           Operative Report  PREOPERATIVE DIAGNOSIS:  Right inguinal hernia.  POSTOPERATIVE DIAGNOSIS:  Indirect right inguinal hernia.  OPERATION PERFORMED:  Right inguinal hernia repair with mesh.  SURGEON:  Adolph Pollack, M.D.  ANESTHESIA:  Local (1% lidocaine with epinephrine plus 0.5% plain Marcaine plus  sodium bicarbonate) with MAC.  INDICATIONS FOR PROCEDURE:  This is a 76 year old male who had been noticing a groin bulge in his right groin that has become increasingly painful and is consistent with a hernia on examination.  He now presents for repair.  DESCRIPTION OF PROCEDURE:  He was placed supine on the operating table and given intravenous sedation.  The right groin area was shaved and sterilely prepped and draped.  Local anesthetic was infiltrated in an oblique fashion in the superficial and deep tissues in the right groin.  An oblique right groin incision was made incising the skin sharply and carrying this down through the subcutaneous tissues and Scarpas fascia all the way to the external oblique aponeurosis with the cautery.  More local anesthetic was infiltrated deep to the external oblique aponeurosis and it was split in the direction of its fibers sharply.  The underlying ilioinguinal nerve was identified, preserved and local anesthetic infiltrated around its site.  The spermatic cord was identified after identifying the shelving edge of the inguinal ligament inferiorly.  Superiorly I separated he external oblique aponeurosis from the underlying internal oblique aponeurosis. The cord was isolated, the sac identified and stripped from the cord.  The sac was hen ligated with Vicryl suture and excess cut and sent as specimen.  Next, a piece of  3 x 6 inch mesh was brought into the field and anchored 1 cm medial to the pubic tubercle with a 2-0 Prolene suture.  The inferior edge of the mesh was anchored to the shelving edge.  The inferior edge of the mesh was anchored to the shelving edge of the inguinal ligament to the level of the internal ring  with a running 2-0 Prolene suture.  The superior aspect of the mesh was anchored to the internal oblique muscle aponeurosis with interrupted 2-0 Vicryl sutures.  A  split was made in the mesh and this was wrapped around the spermatic cord to form a new internal ring.  The tails of the mesh were approximated to the shelving edge of the inguinal ligament with a single 2-0 Prolene suture.  Hemostasis was adequate at this time.  The external oblique aponeurosis was closed over the mesh with a running 2-0 Vicryl suture.  Scarpas fascia was reapproximated with a running 3-0 Vicryl suture and the skin closed with a 4-0 Monocryl subcuticular stitch followed by Steri-Strips and a sterile dressing.  The patient tolerated the procedure well without any apparent complications and he was taken to the recovery room in satisfactory condition. DD:  07/25/99 TD:  07/26/99 Job: [id number]

## 2011-01-20 NOTE — Op Note (Signed)
. [facility]  Patient:    Joshua Hudson, Joshua Hudson Visit Number: [id number] MRN: [id number]          Service Type: SUR Location: [id number] [id number] Attending Physician:  Brandy Hale Dictated by:   Angelia Mould. Derrell Lolling, M.D. Proc. Date: 02/04/02 Admit Date:  02/01/2002   CC:         Thora Lance, M.D.   Operative Report  PREOPERATIVE DIAGNOSIS:  Sigmoid diverticulitis with perforation and peritonitis.  POSTOPERATIVE DIAGNOSIS:  Sigmoid diverticulitis with perforation and peritonitis.  OPERATION PERFORMED:  Exploratory laparotomy, sigmoid colectomy, end sigmoid colostomy, oversew rectal segment (Hartmann resection).  SURGEON:  Angelia Mould. Derrell Lolling, M.D.  ASSISTANT:  Currie Paris, M.D.  ANESTHESIA:  INDICATIONS FOR PROCEDURE:  The patient is a 76 year old white male who presented on February 02, 2002 with a 12 to 24 hour history of lower abdominal pain.  He was found to have lower abdominal tenderness but the upper abdomen was soft and nontender and had good bowel sounds.  CT scan showed inflammatory process in the sigmoid colon and some free air scattered about the abdomen but this was not a lot and he had no abscess or free fluid.  He was initially treated nonoperatively with bowel rest and antibiotics and over the last 48 hours, he has had progressive pain and tenderness and is brought to the operating room for colon resection because of failure to respond to antibiotics.  OPERATIVE FINDINGS:  The patient had a pinhole perforation of the mid to distal sigmoid colon with a focal inflammatory mass in that area.  The rest of the colon was perfectly soft and healthy-appearing.  There was a fair amount of turbid fluid throughout the midabdomen and pelvis and in fact there was some fluid above the liver and above the spleen.  There was no significant odor.  There were chronic adhesions in the right lower quadrant between loops of bowel and the  right colon from his previous appendectomy.  The entire small bowel was examined from the ligament of Treitz all the way to the ileocecal valve and there was no primary inflammatory process of the small bowel.  There was some inflammatory exudate in the pelvis which was easily debrided.  The liver looked normal.  The gallbladder was distended but had a normal color and was thin-walled.  The stomach and duodenum and spleen felt fine.  The omentum had some exudate on it which was easily debrided.  There was no palpable abnormality in the retroperitoneum.  DESCRIPTION OF PROCEDURE:  Following the induction of general endotracheal anesthesia, the patients abdomen was prepped and draped in sterile fashion. Lower midline laparotomy was performed.  The abdomen was entered and explored with the findings as described above.  We packed away the small bowel.  We mobilized the sigmoid colon by dividing its lateral peritoneal attachments. It became quite obvious where the focal inflammatory process and focal perforation was and that was easily identified.  We transected the proximal sigmoid colon about 4 to 5 inches proximal to the perforation using a GIA stapling device.  The mesenteric vessels were isolated, clamped, divided and ligated with 2-0 silk ties.  We took the mesenteric dissection close to the colon to avoid getting back into the retroperitoneum.  We transected the proximal rectum with a GIA stapling device and removed the specimen.  The rectal stump was marked with two Prolene sutures.  There was a fairly significant length of intraperitoneal rectum.  We  then irrigated the abdomen and subphrenic spaces and pelvis quite thoroughly with about 7 or 8 liters of saline.  Some inflammatory exudate in the pelvis was debrided easily with forceps.  We took down some adhesions in the right lower quadrant as we examined the small bowel.  We irrigated further.  A few small bleeders were controlled  with electrocautery. Ultimately we were satisfied with very good hemostasis.  We placed a 18 [place] Blake drain in the pelvis and brought it out through a separate stab wound in the right lower quadrant, sutured it to the skin with a nylon suture and connected it to a suction bulb.  A site to the colostomy was identified in the left lower quadrant.  The lateral half of the left rectus sheath just below the umbilicus.  A circular button of skin was excised.  The subcutaneous fat was debrided.  The anterior rectus sheath was incised in a cruciate fashion.  The rectus muscles were separated and divided sagitally.  The posterior rectus sheath was incised.  We then dilated this tract until it would easily admit two fingers.  We then examined the proximal colon which had been stapled off going into this carefully to avoid twisting and brought this out through the colostomy wound. We had a bleeder in the colostomy wound which was controlled with silk sutures and then we had excellent hemostasis.  We checked the abdomen one more time for bleeding and everything looked fine. The sponge count was good.  Small bowel and omentum were returned to the anatomic positions.  The midline fascia was closed with running suture of #1 Novofil and the skin closed with a few staples but mostly was packed open with Telfa.  The colostomy was matured with about 10 interrupted sutures of 3-0 Vicryl. The colostomy was pink and healthy.  It bled easily and looked quite healthy and viable.  We had a nice protrusion of the colostomy stoma.  The colostomy back was placed.  Clean bandages were placed in the midline wound.  The patient tolerated the procedure well and was taken to the recovery room in stable condition.  Estimated blood loss was about 200 cc to 250 cc. Complications were none.  Sponge, needle and instrument counts were correct. Dictated by:   Angelia Mould. Derrell Lolling, M.D. Attending Physician:  Brandy Hale DD:  02/04/02 TD:  02/05/02 Job: [phone]  [id number]

## 2011-01-20 NOTE — Discharge Summary (Signed)
[facility]  Patient:    Joshua Hudson, Joshua Hudson Visit Number: [id number] MRN: [id number]          Service Type: SUR Location: 5700 5712 01 Attending Physician:  Brandy Hale Dictated by:   Angelia Mould. Derrell Lolling, M.D. Admit Date:  02/01/2002 Discharge Date: 02/11/2002   CC:         Kirby Funk, M.D.   Discharge Summary  FINAL DIAGNOSES: 1. Ruptured sigmoid diverticulitis with peritonitis. 2. Rheumatoid arthritis on low dose steroids. 3. Status post appendectomy. 4. Status post lumbar laminectomy times two.  OPERATION PERFORMED: Sigmoid colectomy with colostomy Gertie Gowda resection).  HISTORY: The patient is a 76 year old white man who presented to the [facility] Emergency Room with a 24 hour history of lower abdominal pain.  He stated initially this was more in the right lower quadrant, but at the time of presentation it had become more in the left lower quadrant.  The pain was progressive.  He denied nausea, vomiting, fever, chills or diarrhea.  His bowel movements had been normal.  He came to the emergency room and was evaluated by Dr. Denton Lank. CT scan showed sigmoid diverticulitis and some small amounts of free air but no abscess or free fluid.  PAST MEDICAL HISTORY: 1. Rheumatoid arthritis on prednisone 5 mg a day. 2. Appendectomy. 3. Right inguinal hernia repair. 4. Two lumbar laminectomies. 5. One neck operation.  CURRENT MEDICATIONS: Prednisone 5 mg a day.  DRUG ALLERGIES:  None known.  PHYSICAL EXAMINATION:  GENERAL:  Pleasant older gentleman who is thin and appeared fit.  VITAL SIGNS:  Temperature 97.7, pulse 95, respirations 20, blood pressure 130/82.  NECK:  No mass.  LUNGS:  Clear.  HEART:  Regular rate and rhythm.  ABDOMEN:  Not distended, bowel sounds present. Significant tenderness and guarding in the left lower quadrant but no mass, a little bit of right lower quadrant tenderness without mass, soft  elsewhere.  GENITALIA:  Normal.  ADMISSION DATA:  Hemoglobin 12.3, white count 12,200.  HOSPITAL COURSE: The patient was felt to have acute sigmoid diverticulitis with microperforation but seemed clinically stable and initial evaluation was made and I felt that we should treat him nonoperatively in the short term.  He was admitted, placed on bowel rest and broad spectrum antibiotics.  Over the next 24 to 48 hours, his pain initially improved but then got worse.  Another CT scan showed slight increase in pneumoperitoneum but no extravasation of contrast.  His pain progressed and we felt that he was having an ongoing leak and he was taken to the operating room on June 3.  I found that he had a sigmoid diverticulitis with a microperforation.  He had peritonitis and although not a lot of gross fecal contamination he did have some turbid fluid with exudate but no odor.  A sigmoid colectomy was performed.  We closed the rectal stump and performed a left sided colostomy.  Postoperatively the patient did well.  His pain improved rapidly over the next two to three days. He was given steroid coverage and that was rapidly tapered.  He became afebrile and began feeling and looking better.  His pathology report showed acute diverticulitis with peridiverticular abscess and perforation.  No malignancy was seen.  The patient continued to progress in his diet.  He began to have stool out of his colostomy.  He was taught colostomy care by the enterostomal therapist. He became fairly independent in most things although still was needing some help with  his colostomy at the time of discharge.  He was discharged on February 11, 2002.  At that time he was eating a regular diet, had excellent colostomy function and felt well.  His wounds and his stoma looked fine.  DISCHARGE MEDICATIONS: 1. Vicodin for pain. 2. Augmentin 875 mg p.o. b.i.d. for three more days.  FOLLOW-UP: He was asked to return to the  office in one week.  Home health care nursing was arranged. Dictated by:   Angelia Mould. Derrell Lolling, M.D. Attending Physician:  Brandy Hale DD:  02/19/02 TD:  02/20/02 Job: [phone] [id number]

## 2011-01-20 NOTE — Op Note (Signed)
NAME:  Joshua Hudson, Joshua Hudson                         ACCOUNT NO.:  [id number]   MEDICAL RECORD NO.:  [id number]                   PATIENT TYPE:  AMB   LOCATION:  DAY                                  FACILITY:  [facility]   PHYSICIAN:  Angelia Mould. Derrell Lolling, M.D.             DATE OF BIRTH:  Jan 20, 1935   DATE OF PROCEDURE:  09/07/2003  DATE OF DISCHARGE:                                 OPERATIVE REPORT   PREOPERATIVE DIAGNOSIS:  Ventral incisional hernia.   POSTOPERATIVE DIAGNOSIS:  Ventral incisional hernia.   OPERATION PERFORMED:  Repair of ventral incisional hernia with onlay  polypropylene mesh.   SURGEON:  Angelia Mould. Derrell Lolling, M.D.   OPERATIVE INDICATION:  This is a 76 year old white man with steroid-  dependent rheumatoid arthritis.  Last year he underwent a two-stage  resection for ruptured sigmoid diverticulitis with peritonitis.  He  recovered from his colostomy closure procedure without any problems.  For  the past two or three months he has developed a painful ventral hernia in  the left side of his abdomen at the colostomy site.  He has undergone workup  including colonoscopy and CT scan.  The only abnormal finding was a hernia  at the colostomy site.  Because this continues to be painful and because of  the risk of incarceration, he is brought to the operating room electively  for repair.   OPERATIVE TECHNIQUE:  Following the induction of general endotracheal  anesthesia, the patient's abdomen was prepped and draped in a sterile  fashion.  The colostomy incision in the left lower quadrant was inspected.  A vertically-oriented incision was made ellipsing out the old scar.  This  incision was probably 8-10 cm in length.  Dissection was carried down  through the subcutaneous tissue.  The old Prolene sutures were removed.  We  dissected the subcutaneous tissue away from the fascia and ultimately  defined a full-thickness defect in the abdominal wall fascia.  This was  approximately 2.5  cm in diameter.  We undermined the subcutaneous tissue  circumferentially.  I felt inside under the abdominal wall and felt no other  defects anywhere.  I chose to close this in a simple fashion.  The fascial  defect was closed vertically with interrupted sutures of #1 Novofil.  The  central suture that was placed was a vest over pants-type suture and the  other sutures were simple sutures.  I then brought a 6 x 3 inch piece of  polypropylene mesh to the operative field and cut this down to size a little  bit, making an elliptical piece of mesh.  This was sutured in place with  about 10 interrupted mattress sutures of 0 Prolene.  This covered the defect  and the repair quite nicely. The wound was irrigated with saline.  Hemostasis was excellent.  I placed a 71 [place] Blake drain in the wound and  brought that out through a separate  stab incision superiorly.  The drain was  sutured to the skin with a nylon suture.  The subcutaneous tissue was closed  over the drain with interrupted sutures of 2-0 Vicryl.  The skin was closed with skin staples.  Clean bandages were placed and the  patient taken to the operating room in stable condition.  The estimated  blood loss was about 25 mL.  Complications:  None.  Sponge, needle, and  instrument counts were correct.                                               Angelia Mould. Derrell Lolling, M.D.    HMI/MEDQ  D:  09/07/2003  T:  09/07/2003  Job:  [id number]   cc:   Thora Lance, M.D.  [address]  [place]  [place] [address]  Fax: [phone]   Demetria Pore. Coral Spikes, M.D.  [address]  [address]  [place]  [place] [address]  Fax: [phone]

## 2011-05-31 LAB — CBC
HCT: 25.2 — ABNORMAL LOW
HCT: 25.3 — ABNORMAL LOW
HCT: 26.8 — ABNORMAL LOW
HCT: 27.1 — ABNORMAL LOW
HCT: 28.9 — ABNORMAL LOW
HCT: 36.3 — ABNORMAL LOW
HCT: 37.3 — ABNORMAL LOW
HCT: 25.8 — ABNORMAL LOW
Hemoglobin: 11.5 — ABNORMAL LOW
Hemoglobin: 11.8 — ABNORMAL LOW
Hemoglobin: 8 — ABNORMAL LOW
Hemoglobin: 8.3 — ABNORMAL LOW
Hemoglobin: 8.5 — ABNORMAL LOW
Hemoglobin: 8.7 — ABNORMAL LOW
Hemoglobin: 9.5 — ABNORMAL LOW
Hemoglobin: 8.2 — ABNORMAL LOW
MCHC: 31.6
MCHC: 31.7
MCHC: 31.7
MCHC: 31.8
MCHC: 32.1
MCHC: 32.7
MCHC: 32.8
MCHC: 31.9
MCV: 70.6 — ABNORMAL LOW
MCV: 70.7 — ABNORMAL LOW
MCV: 70.9 — ABNORMAL LOW
MCV: 71 — ABNORMAL LOW
MCV: 71.2 — ABNORMAL LOW
MCV: 71.3 — ABNORMAL LOW
MCV: 71.7 — ABNORMAL LOW
MCV: 71.7 — ABNORMAL LOW
Platelets: 101 — ABNORMAL LOW
Platelets: 104 — ABNORMAL LOW
Platelets: 109 — ABNORMAL LOW
Platelets: 109 — ABNORMAL LOW
Platelets: 124 — ABNORMAL LOW
Platelets: 192
Platelets: 213
Platelets: 116 — ABNORMAL LOW
RBC: 3.54 — ABNORMAL LOW
RBC: 3.58 — ABNORMAL LOW
RBC: 3.76 — ABNORMAL LOW
RBC: 3.85 — ABNORMAL LOW
RBC: 4.08 — ABNORMAL LOW
RBC: 5.11
RBC: 5.2
RBC: 3.6 — ABNORMAL LOW
RDW: 15.6 — ABNORMAL HIGH
RDW: 15.8 — ABNORMAL HIGH
RDW: 15.9 — ABNORMAL HIGH
RDW: 16 — ABNORMAL HIGH
RDW: 16 — ABNORMAL HIGH
RDW: 16 — ABNORMAL HIGH
RDW: 16.2 — ABNORMAL HIGH
RDW: 15.9 — ABNORMAL HIGH
WBC: 12.2 — ABNORMAL HIGH
WBC: 15.8 — ABNORMAL HIGH
WBC: 17.1 — ABNORMAL HIGH
WBC: 19.6 — ABNORMAL HIGH
WBC: 19.8 — ABNORMAL HIGH
WBC: 9.1
WBC: 9.3
WBC: 11.8 — ABNORMAL HIGH

## 2011-05-31 LAB — BASIC METABOLIC PANEL
BUN: 15
BUN: 20
BUN: 21
BUN: 28 — ABNORMAL HIGH
BUN: 30 — ABNORMAL HIGH
CO2: 25
CO2: 25
CO2: 27
CO2: 28
CO2: 28
Calcium: 8.2 — ABNORMAL LOW
Calcium: 8.6
Calcium: 8.9
Calcium: 9.6
Calcium: 8.9
Chloride: 105
Chloride: 105
Chloride: 106
Chloride: 107
Chloride: 104
Creatinine, Ser: 0.9
Creatinine, Ser: 0.96
Creatinine, Ser: 1.28
Creatinine, Ser: 1.31
Creatinine, Ser: 1.15
GFR calc Af Amer: >60
GFR calc Af Amer: >60
GFR calc Af Amer: >60
GFR calc Af Amer: >60
GFR calc Af Amer: >60
GFR calc non Af Amer: 54 — ABNORMAL LOW
GFR calc non Af Amer: 55 — ABNORMAL LOW
GFR calc non Af Amer: >60
GFR calc non Af Amer: >60
GFR calc non Af Amer: >60
Glucose, Bld: 104 — ABNORMAL HIGH
Glucose, Bld: 123 — ABNORMAL HIGH
Glucose, Bld: 131 — ABNORMAL HIGH
Glucose, Bld: 87
Glucose, Bld: 80
Potassium: 4.3
Potassium: 4.6
Potassium: 4.7
Potassium: 5.6 — ABNORMAL HIGH
Potassium: 4.4
Sodium: 138
Sodium: 139
Sodium: 139
Sodium: 139
Sodium: 137

## 2011-05-31 LAB — POCT I-STAT 3, ART BLOOD GAS (G3+)
Acid-Base Excess: 2
Acid-Base Excess: 3 — ABNORMAL HIGH
Bicarbonate: 25.7 — ABNORMAL HIGH
Bicarbonate: 26.3 — ABNORMAL HIGH
Bicarbonate: 26.3 — ABNORMAL HIGH
O2 Saturation: 100
O2 Saturation: 100
O2 Saturation: 100
O2 Saturation: 95
Operator id: 298401
Operator id: 3342
Patient temperature: 35.7
Patient temperature: 36.9
TCO2: 21
TCO2: 27
TCO2: 27
TCO2: 27
TCO2: 28
pCO2 arterial: 24.2 — ABNORMAL LOW
pCO2 arterial: 31.6 — ABNORMAL LOW
pCO2 arterial: 39.6
pCO2 arterial: 39.8
pCO2 arterial: 41.3
pH, Arterial: 7.313 — ABNORMAL LOW
pH, Arterial: 7.415
pH, Arterial: 7.418
pH, Arterial: 7.475 — ABNORMAL HIGH
pH, Arterial: 7.528 — ABNORMAL HIGH
pO2, Arterial: 303 — ABNORMAL HIGH
pO2, Arterial: 304 — ABNORMAL HIGH
pO2, Arterial: 36 — CL
pO2, Arterial: 56 — ABNORMAL LOW
pO2, Arterial: 77 — ABNORMAL LOW

## 2011-05-31 LAB — POCT I-STAT 3, VENOUS BLOOD GAS (G3P V)
Bicarbonate: 26.1 — ABNORMAL HIGH
Operator id: 298401
pCO2, Ven: 35.7 — ABNORMAL LOW
pCO2, Ven: 41.9 — ABNORMAL LOW
pH, Ven: 7.403 — ABNORMAL HIGH
pH, Ven: 7.428 — ABNORMAL HIGH
pO2, Ven: 35
pO2, Ven: 43

## 2011-05-31 LAB — LIPID PANEL
Cholesterol: 174
HDL: 65
LDL Cholesterol: 100 — ABNORMAL HIGH
Total CHOL/HDL Ratio: 2.7
Triglycerides: 43
VLDL: 9

## 2011-05-31 LAB — CREATININE, SERUM
Creatinine, Ser: 0.88
GFR calc Af Amer: >60
GFR calc non Af Amer: >60

## 2011-05-31 LAB — POCT I-STAT 4, (NA,K, GLUC, HGB,HCT)
Glucose, Bld: 117 — ABNORMAL HIGH
Glucose, Bld: 89
HCT: 21 — ABNORMAL LOW
HCT: 27 — ABNORMAL LOW
Hemoglobin: 11.9 — ABNORMAL LOW
Hemoglobin: 7.1 — CL
Hemoglobin: 9.2 — ABNORMAL LOW
Operator id: 137421
Operator id: 3342
Operator id: 3342
Operator id: 3342
Potassium: 3.6
Sodium: 135
Sodium: 136
Sodium: 136

## 2011-05-31 LAB — COMPREHENSIVE METABOLIC PANEL
ALT: 15
AST: 23
Albumin: 3.4 — ABNORMAL LOW
Alkaline Phosphatase: 45
BUN: 22
CO2: 30
Calcium: 9.3
Chloride: 106
Creatinine, Ser: 1.14
GFR calc Af Amer: >60
GFR calc non Af Amer: >60
Glucose, Bld: 103 — ABNORMAL HIGH
Potassium: 5.2 — ABNORMAL HIGH
Sodium: 140
Total Bilirubin: 0.9
Total Protein: 5.5 — ABNORMAL LOW

## 2011-05-31 LAB — BLOOD GAS, ARTERIAL
Acid-Base Excess: 1.6
Bicarbonate: 25.5 — ABNORMAL HIGH
FIO2: 0.21
O2 Saturation: 97.1
Patient temperature: 98.6
TCO2: 26.6
pCO2 arterial: 38.6
pH, Arterial: 7.435
pO2, Arterial: 92

## 2011-05-31 LAB — I-STAT 8, (EC8 V) (CONVERTED LAB)
BUN: 21
Bicarbonate: 23.7
Glucose, Bld: 122 — ABNORMAL HIGH
Operator id: 271091
pCO2, Ven: 45.8

## 2011-05-31 LAB — MAGNESIUM
Magnesium: 2.5
Magnesium: 2.5
Magnesium: 2.6 — ABNORMAL HIGH

## 2011-05-31 LAB — TYPE AND SCREEN
ABO/RH(D): A POS
Antibody Screen: NEGATIVE

## 2011-05-31 LAB — URINALYSIS, ROUTINE W REFLEX MICROSCOPIC
Bilirubin Urine: NEGATIVE
Nitrite: NEGATIVE
Protein, ur: NEGATIVE
Specific Gravity, Urine: 1.013
Urobilinogen, UA: 0.2

## 2011-05-31 LAB — HEMOGLOBIN AND HEMATOCRIT, BLOOD
HCT: 21.8 — ABNORMAL LOW
Hemoglobin: 7.1 — CL

## 2011-05-31 LAB — POCT I-STAT, CHEM 8
Calcium, Ion: 1.16
Creatinine, Ser: 0.9
Glucose, Bld: 170 — ABNORMAL HIGH
HCT: 31 — ABNORMAL LOW
Hemoglobin: 10.5 — ABNORMAL LOW
TCO2: 21

## 2011-05-31 LAB — HEMOGLOBIN A1C
Hgb A1c MFr Bld: 4.6
Mean Plasma Glucose: 86

## 2011-05-31 LAB — PROTIME-INR
INR: 1.2
INR: 2.1 — ABNORMAL HIGH
Prothrombin Time: 15
Prothrombin Time: 23.9 — ABNORMAL HIGH

## 2011-05-31 LAB — APTT
aPTT: 27
aPTT: 46 — ABNORMAL HIGH

## 2013-11-03 ENCOUNTER — Encounter: Payer: Self-pay | Admitting: General Surgery

## 2013-12-17 ENCOUNTER — Telehealth: Payer: Self-pay | Admitting: Cardiology

## 2013-12-17 NOTE — Telephone Encounter (Signed)
Spoke with Lauren and let her know pt would need pre-med for dental procedures.

## 2013-12-17 NOTE — Telephone Encounter (Signed)
New message   Office calling regarding pre-med.   appt has 2 pm today. - dental cleaning.

## 2014-01-01 ENCOUNTER — Ambulatory Visit: Payer: Self-pay | Admitting: Cardiology

## 2014-01-02 ENCOUNTER — Ambulatory Visit (INDEPENDENT_AMBULATORY_CARE_PROVIDER_SITE_OTHER): Payer: 59 | Admitting: Cardiology

## 2014-01-02 ENCOUNTER — Encounter: Payer: Self-pay | Admitting: Cardiology

## 2014-01-02 VITALS — BP 130/75 | HR 67 | Ht 62.0 in | Wt 125.0 lb

## 2014-01-02 DIAGNOSIS — I350 Nonrheumatic aortic (valve) stenosis: Secondary | ICD-10-CM | POA: Insufficient documentation

## 2014-01-02 DIAGNOSIS — R011 Cardiac murmur, unspecified: Secondary | ICD-10-CM

## 2014-01-02 DIAGNOSIS — I359 Nonrheumatic aortic valve disorder, unspecified: Secondary | ICD-10-CM

## 2014-01-02 MED ORDER — ASPIRIN 81 MG PO TBEC: 81.0000 mg | DELAYED_RELEASE_TABLET | Freq: Every day | ORAL | Status: DC

## 2014-01-02 NOTE — Progress Notes (Signed)
[place] [place], [place]  [address] Phone: [phone] Fax:  [phone]  Date:  01/02/2014   ID:  SATISH HAMMERS, DOB 1935/05/04, MRN [id number]  PCP:  Irven Shelling, MD  Sleep Medicine:  [name], MD     History of Present Illness: Joshua Hudson is a 79 y.o. male with a history of severe AS s/p prosthetic AVR and post op afib with no reoccurence who presents today for followup.  He is doing well.  He denies any chest pain, SOB, DOE, LE edema, dizziness, palpitations or syncope.   Wt Readings from Last 3 Encounters:  01/02/14 125 lb (56.7 kg)     Past Medical History  Diagnosis Date  . Severe aortic stenosis     S/P prosthetic valve replacement w 25 mm Edwards like science percardial tissue valve,Kaeley Vinje  . Rheumatoid arthritis     s/o long term steroids  . Atrial fibrillation     postoperatively w/o recurrence-Deondrea Markos  . DJD (degenerative joint disease), lumbosacral   . Diverticulosis   . Occipital neuralgia   . Colon polyps     s/p diverticular perforation requiring 2-stage repair  . SBE (subacute bacterial endocarditis) prophylaxis candidate     for dental procedures    Current Outpatient Prescriptions  Medication Sig Dispense Refill  . aspirin EC 325 MG tablet Take 325 mg by mouth daily.      . folic acid (FOLVITE) 1 MG tablet Take 1 mg by mouth daily.      . methotrexate (RHEUMATREX) 2.5 MG tablet Take 25 mg by mouth once a week. Caution:Chemotherapy. Protect from light.      . metoprolol succinate (TOPROL-XL) 25 MG 24 hr tablet Take 25 mg by mouth daily.      . Multiple Vitamin (MULTIVITAMIN) tablet Take 1 tablet by mouth daily.      . predniSONE (DELTASONE) 5 MG tablet Take by mouth daily with breakfast. Take 1-2 tablets daily as directed      . sildenafil (VIAGRA) 100 MG tablet Take 50 mg by mouth daily as needed for erectile dysfunction.       No current facility-administered medications for this visit.    Allergies:   No Known  Allergies  Social History:  The patient  reports that he quit smoking about 20 years ago. His smoking use included Cigarettes. He smoked 0.00 packs per day. He does not have any smokeless tobacco history on file. He reports that he drinks alcohol. He reports that he does not use illicit drugs.   Family History:  The patient's family history is not on file.   ROS:  Please see the history of present illness.      All other systems reviewed and negative.   PHYSICAL EXAM: VS:  BP 130/75  Pulse 67  Ht 5\' 2"  (1.575 m)  Wt 125 lb (56.7 kg)  BMI 22.86 kg/m2 Well nourished, well developed, in no acute distress HEENT: normal Neck: no JVD Cardiac:  normal S1, S2; RRR; 2/6 SM at LLSB Lungs:  clear to auscultation bilaterally, no wheezing, rhonchi or rales Abd: soft, nontender, no hepatomegaly Ext: no edema Skin: warm and dry Neuro:  CNs 2-12 intact, no focal abnormalities noted    ASSESSMENT AND PLAN:  1. Severe AS/ s/p Pericardia tissue AVR and doing well with heart murmur at LLSB - continue ASA and decrease to 81mg  daily - check 2D echo  Followup with me in 1 year  Signed, [name], MD  01/02/2014 3:13 PM

## 2014-01-02 NOTE — Patient Instructions (Signed)
Your physician has recommended you make the following change in your medication: 1. Decrease Aspirin to 81 MG 1 tablet daily  Your physician has requested that you have an echocardiogram. Echocardiography is a painless test that uses sound waves to create images of your heart. It provides your doctor with information about the size and shape of your heart and how well your heart's chambers and valves are working. This procedure takes approximately one hour. There are no restrictions for this procedure.  Your physician wants you to follow-up in: 12 months with Dr Mallie Snooks will receive a reminder letter in the mail two months in advance. If you don't receive a letter, please call our office to schedule the follow-up appointment.

## 2014-01-20 ENCOUNTER — Ambulatory Visit (HOSPITAL_COMMUNITY): Payer: Medicare Other | Attending: Cardiovascular Disease | Admitting: Radiology

## 2014-01-20 ENCOUNTER — Other Ambulatory Visit: Payer: Self-pay

## 2014-01-20 DIAGNOSIS — R011 Cardiac murmur, unspecified: Secondary | ICD-10-CM | POA: Insufficient documentation

## 2014-01-20 NOTE — Progress Notes (Signed)
Echocardiogram performed.  

## 2014-01-27 ENCOUNTER — Encounter: Payer: Self-pay | Admitting: *Deleted

## 2014-01-27 ENCOUNTER — Telehealth: Payer: Self-pay | Admitting: *Deleted

## 2014-01-27 DIAGNOSIS — Z01812 Encounter for preprocedural laboratory examination: Secondary | ICD-10-CM

## 2014-01-27 NOTE — Telephone Encounter (Signed)
Message copied by Earvin Hansen on Tue Jan 27, 2014  5:48 PM ------      Message from: [place], [place]      Created: Tue Jan 27, 2014  9:36 AM       To Rip Harbour to call pt and schedule. ------

## 2014-01-27 NOTE — Telephone Encounter (Signed)
Advised patient, TEE scheduled for 5/28. Patient will come tomorrow for labs. Letter printed for patient

## 2014-01-28 ENCOUNTER — Other Ambulatory Visit (INDEPENDENT_AMBULATORY_CARE_PROVIDER_SITE_OTHER): Payer: 59

## 2014-01-28 DIAGNOSIS — Z01812 Encounter for preprocedural laboratory examination: Secondary | ICD-10-CM

## 2014-01-28 DIAGNOSIS — I359 Nonrheumatic aortic valve disorder, unspecified: Secondary | ICD-10-CM

## 2014-01-28 LAB — BASIC METABOLIC PANEL
BUN: 19 mg/dL (ref 6–23)
CALCIUM: 9.6 mg/dL (ref 8.4–10.5)
CO2: 28 meq/L (ref 19–32)
CREATININE: 1.2 mg/dL (ref 0.4–1.5)
Chloride: 103 mEq/L (ref 96–112)
GFR: 65.49 mL/min (ref >60.00)
GLUCOSE: 84 mg/dL (ref 70–99)
Potassium: 4.6 mEq/L (ref 3.5–5.1)
SODIUM: 139 meq/L (ref 135–145)

## 2014-01-28 LAB — CBC WITH DIFFERENTIAL/PLATELET
Basophils Relative: 0 % (ref 0.0–3.0)
Eosinophils Relative: 0 % (ref 0.0–5.0)
HCT: 35.5 % — ABNORMAL LOW (ref 39.0–52.0)
HEMOGLOBIN: 11.3 g/dL — AB (ref 13.0–17.0)
LYMPHS PCT: 12 % (ref 12.0–46.0)
MCHC: 31.8 g/dL (ref 30.0–36.0)
MCV: 72.7 fl — ABNORMAL LOW (ref 78.0–100.0)
MONOS PCT: 3 % (ref 3.0–12.0)
NEUTROS PCT: 85 % — AB (ref 43.0–77.0)
Platelets: 191 10*3/uL (ref 150.0–400.0)
RBC: 4.88 Mil/uL (ref 4.22–5.81)
RDW: 16.8 % — AB (ref 11.5–15.5)
WBC: 6.6 10*3/uL (ref 4.0–10.5)

## 2014-01-28 LAB — PROTIME-INR
INR: 1.1 ratio — ABNORMAL HIGH (ref 0.8–1.0)
Prothrombin Time: 12.3 s (ref 9.6–13.1)

## 2014-01-29 ENCOUNTER — Telehealth: Payer: Self-pay | Admitting: Cardiology

## 2014-01-29 ENCOUNTER — Encounter (HOSPITAL_COMMUNITY): Payer: Self-pay | Admitting: Gastroenterology

## 2014-01-29 ENCOUNTER — Ambulatory Visit (HOSPITAL_COMMUNITY)
Admission: RE | Admit: 2014-01-29 | Discharge: 2014-01-29 | Disposition: A | Discharge status: Home / Self Care | Payer: Medicare Other | Source: Ambulatory Visit | Attending: Cardiology | Admitting: Cardiology

## 2014-01-29 ENCOUNTER — Encounter (HOSPITAL_COMMUNITY)
Admission: RE | Disposition: A | Discharge status: Home / Self Care | Payer: Self-pay | Source: Ambulatory Visit | Attending: Cardiology

## 2014-01-29 DIAGNOSIS — M51379 Other intervertebral disc degeneration, lumbosacral region without mention of lumbar back pain or lower extremity pain: Secondary | ICD-10-CM | POA: Insufficient documentation

## 2014-01-29 DIAGNOSIS — M069 Rheumatoid arthritis, unspecified: Secondary | ICD-10-CM | POA: Insufficient documentation

## 2014-01-29 DIAGNOSIS — I059 Rheumatic mitral valve disease, unspecified: Secondary | ICD-10-CM

## 2014-01-29 DIAGNOSIS — Z952 Presence of prosthetic heart valve: Secondary | ICD-10-CM | POA: Insufficient documentation

## 2014-01-29 DIAGNOSIS — Z953 Presence of xenogenic heart valve: Secondary | ICD-10-CM

## 2014-01-29 DIAGNOSIS — Z79899 Other long term (current) drug therapy: Secondary | ICD-10-CM | POA: Insufficient documentation

## 2014-01-29 DIAGNOSIS — Z7982 Long term (current) use of aspirin: Secondary | ICD-10-CM | POA: Insufficient documentation

## 2014-01-29 DIAGNOSIS — IMO0002 Reserved for concepts with insufficient information to code with codable children: Secondary | ICD-10-CM | POA: Insufficient documentation

## 2014-01-29 DIAGNOSIS — Z87891 Personal history of nicotine dependence: Secondary | ICD-10-CM | POA: Insufficient documentation

## 2014-01-29 DIAGNOSIS — M5137 Other intervertebral disc degeneration, lumbosacral region: Secondary | ICD-10-CM | POA: Insufficient documentation

## 2014-01-29 DIAGNOSIS — R931 Abnormal findings on diagnostic imaging of heart and coronary circulation: Secondary | ICD-10-CM

## 2014-01-29 DIAGNOSIS — I511 Rupture of chordae tendineae, not elsewhere classified: Secondary | ICD-10-CM

## 2014-01-29 DIAGNOSIS — Z0389 Encounter for observation for other suspected diseases and conditions ruled out: Secondary | ICD-10-CM | POA: Insufficient documentation

## 2014-01-29 HISTORY — PX: TEE WITHOUT CARDIOVERSION: SHX5443

## 2014-01-29 SURGERY — ECHOCARDIOGRAM, TRANSESOPHAGEAL
Anesthesia: Moderate Sedation

## 2014-01-29 MED ORDER — MIDAZOLAM HCL 5 MG/ML IJ SOLN
INTRAMUSCULAR | Status: AC
  Filled 2014-01-29: qty 2

## 2014-01-29 MED ORDER — LIDOCAINE VISCOUS 2 % MT SOLN
OROMUCOSAL | Status: DC | PRN
  Administered 2014-01-29: 10 mL via OROMUCOSAL

## 2014-01-29 MED ORDER — SODIUM CHLORIDE 0.9 % IV SOLN
INTRAVENOUS | Status: DC
  Administered 2014-01-29: 500 mL via INTRAVENOUS

## 2014-01-29 MED ORDER — MIDAZOLAM HCL 10 MG/2ML IJ SOLN
INTRAMUSCULAR | Status: DC | PRN
  Administered 2014-01-29 (×2): 2 mg via INTRAVENOUS

## 2014-01-29 MED ORDER — SODIUM CHLORIDE 0.9 % IV SOLN: INTRAVENOUS | Status: DC

## 2014-01-29 MED ORDER — LIDOCAINE VISCOUS 2 % MT SOLN
OROMUCOSAL | Status: AC
  Filled 2014-01-29: qty 15

## 2014-01-29 MED ORDER — FENTANYL CITRATE 0.05 MG/ML IJ SOLN
INTRAMUSCULAR | Status: DC | PRN
  Administered 2014-01-29 (×2): 25 ug via INTRAVENOUS

## 2014-01-29 MED ORDER — SODIUM CHLORIDE 0.9 % IV SOLN
2.0000 g | Freq: Once | INTRAVENOUS | Status: AC
  Administered 2014-01-29: 2 g via INTRAVENOUS
  Filled 2014-01-29 (×2): qty 2000

## 2014-01-29 MED ORDER — FENTANYL CITRATE 0.05 MG/ML IJ SOLN
INTRAMUSCULAR | Status: AC
  Filled 2014-01-29: qty 2

## 2014-01-29 NOTE — Interval H&P Note (Signed)
History and Physical Interval Note:  01/29/2014 9:54 AM  Joshua Hudson  has presented today for surgery, with the diagnosis of abnormal AB  The various methods of treatment have been discussed with the patient and family. After consideration of risks, benefits and other options for treatment, the patient has consented to  Procedure(s): TRANSESOPHAGEAL ECHOCARDIOGRAM (TEE) (N/A) as a surgical intervention .  The patient's history has been reviewed, patient examined, no change in status, stable for surgery.  I have reviewed the patient's chart and labs.  Questions were answered to the patient's satisfaction.     Sueanne Margarita

## 2014-01-29 NOTE — Progress Notes (Signed)
Echocardiogram Echocardiogram Transesophageal has been performed.  Dylann Layne M Ouita Nish 01/29/2014, 1:16 PM 

## 2014-01-29 NOTE — H&P (View-Only) (Signed)
Echocardiogram Echocardiogram Transesophageal has been performed.  Joshua Hudson 01/29/2014, 1:16 PM

## 2014-01-29 NOTE — Interval H&P Note (Signed)
History and Physical Interval Note:  01/29/2014 1:27 PM  Joshua Hudson  has presented today for surgery, with the diagnosis of abnormal AB  The various methods of treatment have been discussed with the patient and family. After consideration of risks, benefits and other options for treatment, the patient has consented to  Procedure(s): TRANSESOPHAGEAL ECHOCARDIOGRAM (TEE) (N/A) as a surgical intervention .  The patient's history has been reviewed, patient examined, no change in status, stable for surgery.  I have reviewed the patient's chart and labs.  Questions were answered to the patient's satisfaction.     Sueanne Margarita

## 2014-01-29 NOTE — Telephone Encounter (Signed)
Please let patient know that I reviewed his TEE with several of my colleagues.  The AV appears to be working fine but there is still an area around where the AV bioprosthesis is sewn in that is unclear as to whether the changes are just post surgical scaring vs. Possible indolent infection with abcess.  Please have patient come in for CBC with diff, CRP, ESR and blood cultures x 2.  I would like him set up to get a Chest Cardiac CTA with Cardiac morphology and coronary artereis to assess this further.  Please set this up for next Wednesday or Thursday with instructions that this needs to be read by Dr. Ena Dawley

## 2014-01-29 NOTE — Discharge Instructions (Signed)
Conscious Sedation, Adult, Care After Refer to this sheet in the next few weeks. These instructions provide you with information on caring for yourself after your procedure. Your health care provider may also give you more specific instructions. Your treatment has been planned according to current medical practices, but problems sometimes occur. Call your health care provider if you have any problems or questions after your procedure. WHAT TO EXPECT AFTER THE PROCEDURE  After your procedure:  You may feel sleepy, clumsy, and have poor balance for several hours.  Vomiting may occur if you eat too soon after the procedure. HOME CARE INSTRUCTIONS  Do not participate in any activities where you could become injured for at least 24 hours. Do not:  Drive.  Swim.  Ride a bicycle.  Operate heavy machinery.  Cook.  Use power tools.  Climb ladders.  Work from a high place.  Do not make important decisions or sign legal documents until you are improved.  If you vomit, drink water, juice, or soup when you can drink without vomiting. Make sure you have little or no nausea before eating solid foods.  Only take over-the-counter or prescription medicines for pain, discomfort, or fever as directed by your health care provider.  Make sure you and your family fully understand everything about the medicines given to you, including what side effects may occur.  You should not drink alcohol, take sleeping pills, or take medicines that cause drowsiness for at least 24 hours.  If you smoke, do not smoke without supervision.  If you are feeling better, you may resume normal activities 24 hours after you were sedated.  Keep all appointments with your health care provider. SEEK MEDICAL CARE IF:  Your skin is pale or bluish in color.  You continue to feel nauseous or vomit.  Your pain is getting worse and is not helped by medicine.  You have bleeding or swelling.  You are still sleepy or  feeling clumsy after 24 hours. SEEK IMMEDIATE MEDICAL CARE IF:  You develop a rash.  You have difficulty breathing.  You develop any type of allergic problem.  You have a fever. MAKE SURE YOU:  Understand these instructions.  Will watch your condition.  Will get help right away if you are not doing well or get worse. Document Released: 06/11/2013 Document Reviewed: 03/28/2013 [facility] Patient Information 2014 [place], [place].   Transesophageal Echocardiography Transesophageal echocardiography (TEE) is a picture test of your heart using sound waves. The pictures taken can give very detailed pictures of your heart. This can help your doctor see if there are problems with your heart. TEE can check:  If your heart has blood clots in it.  How well your heart valves are working.  If you have an infection on the inside of your heart.  Some of the major arteries of your heart.  If your heart valve is working after a Office manager.  Your heart before a procedure that uses a shock to your heart to get the rhythm back to normal. BEFORE THE PROCEDURE  Do not eat or drink for 6 hours before the procedure or as told by your doctor.  Make plans to have someone drive you home after the procedure. Do not drive yourself home.  An IV tube will be put in your arm. PROCEDURE  You will be given a medicine to help you relax (sedative). It will be given through the IV tube.  A numbing medicine will be sprayed in the back of your throat  to help numb it.  The tip of the probe is placed into the back of your mouth. You will be asked to swallow. This helps to pass the probe into your esophagus.  Once the tip of the probe is in the right place, your doctor can take pictures of your heart.  You may feel pressure at the back of your throat. AFTER THE PROCEDURE  You will be taken to a recovery area so the sedative can wear off.  Your throat may be sore and scratchy. This will go away slowly over  time.  You will go home when you are fully awake and able to swallow liquids.  You should have someone stay with you for the next 24 hours. Document Released: 06/18/2009 Document Revised: 06/11/2013 Document Reviewed: 02/20/2013 [facility] Patient Information 2014 [place], [place].

## 2014-01-29 NOTE — H&P (Signed)
ID: [name], DOB 04/28/1935, MRN [id number]  PCP: Irven Shelling, MD  Sleep Medicine: [name], MD  History of Present Illness:  Joshua Hudson is a 79 y.o. male with a history of severe AS s/p prosthetic AVR and post op afib with no reoccurence who presents today for followup. He is doing well. He denies any chest pain, SOB, DOE, LE edema, dizziness, palpitations or syncope.  Wt Readings from Last 3 Encounters:   01/02/14  125 lb (56.7 kg)    Past Medical History   Diagnosis  Date   .  Severe aortic stenosis      S/P prosthetic valve replacement w 25 mm Edwards like science percardial tissue valve,Beacher Every   .  Rheumatoid arthritis      s/o long term steroids   .  Atrial fibrillation      postoperatively w/o recurrence-[facility]ant   .  DJD (degenerative joint disease), lumbosacral    .  Diverticulosis    .  Occipital neuralgia    .  Colon polyps      s/p diverticular perforation requiring 2-stage repair   .  SBE (subacute bacterial endocarditis) prophylaxis candidate      for dental procedures    Current Outpatient Prescriptions   Medication  Sig  Dispense  Refill   .  aspirin EC 325 MG tablet  Take 325 mg by mouth daily.     .  folic acid (FOLVITE) 1 MG tablet  Take 1 mg by mouth daily.     .  methotrexate (RHEUMATREX) 2.5 MG tablet  Take 25 mg by mouth once a week. Caution:Chemotherapy. Protect from light.     .  metoprolol succinate (TOPROL-XL) 25 MG 24 hr tablet  Take 25 mg by mouth daily.     .  Multiple Vitamin (MULTIVITAMIN) tablet  Take 1 tablet by mouth daily.     .  predniSONE (DELTASONE) 5 MG tablet  Take by mouth daily with breakfast. Take 1-2 tablets daily as directed     .  sildenafil (VIAGRA) 100 MG tablet  Take 50 mg by mouth daily as needed for erectile dysfunction.      No current facility-administered medications for this visit.   Allergies: No Known Allergies  Social History: The patient reports that he quit smoking about 20 years ago. His smoking  use included Cigarettes. He smoked 0.00 packs per day. He does not have any smokeless tobacco history on file. He reports that he drinks alcohol. He reports that he does not use illicit drugs.  Family History: The patient's family history is not on file.  ROS: Please see the history of present illness. All other systems reviewed and negative.  PHYSICAL EXAM:  VS: BP 130/75  Pulse 67  Ht 5\' 2"  (1.575 m)  Wt 125 lb (56.7 kg)  BMI 22.86 kg/m2  Well nourished, well developed, in no acute distress  HEENT: normal  Neck: no JVD  Cardiac: normal S1, S2; RRR; 2/6 SM at LLSB  Lungs: clear to auscultation bilaterally, no wheezing, rhonchi or rales  Abd: soft, nontender, no hepatomegaly  Ext: no edema  Skin: warm and dry  Neuro: CNs 2-12 intact, no focal abnormalities noted  ASSESSMENT AND PLAN:  Severe AS/ s/p Pericardia tissue AVR and doing well with heart murmur at LLSB.   - continue ASA and decrease to 81mg  daily  - check 2D echo to assess AVR  Signed,  [name], MD  Addendum:  2D echo showed  an echolucent areas inferior to the AVR and TEE was recommended so we will set him up for TEE.

## 2014-01-29 NOTE — CV Procedure (Addendum)
   PROCEDURE NOTE  Procedure:  Transesophageal echocardiogram Operator:  [name], MD Indications:  AVR abnormality on TTE Complications: None IV Meds: Versed 4mg  IV, fentanyl 14mcg IV, Viscous Lidocaine 5cc  Results: Normal LV size and function Normal RV size and function Mildly dilated LA with normal LAA with no thrombus Mildly dilated RA Normal TV with mild TR Normal PV with trivial PR Normal MV leaflets with possible small ruptured chordae tendinae with mild to moderate MR central in location Prosthetic AVR that is well seated with no rocking motion.  Trivial Central AI.  There is a lucent area around the outer perimeter of the valve that does not have any flow in it by colorflow doppler but is concerning for possible abcess vs. Post op changes. Normal thoracic and ascending aorta Normal Interatrial septum with no PFO by colorflow doppler  The patient toleated the procedure well and was transferred back to his room in stable condition.

## 2014-01-30 ENCOUNTER — Encounter (HOSPITAL_COMMUNITY): Payer: Self-pay | Admitting: Cardiology

## 2014-01-30 ENCOUNTER — Other Ambulatory Visit (INDEPENDENT_AMBULATORY_CARE_PROVIDER_SITE_OTHER): Payer: 59

## 2014-01-30 DIAGNOSIS — I059 Rheumatic mitral valve disease, unspecified: Secondary | ICD-10-CM

## 2014-01-30 DIAGNOSIS — I511 Rupture of chordae tendineae, not elsewhere classified: Secondary | ICD-10-CM

## 2014-01-30 LAB — CBC WITH DIFFERENTIAL/PLATELET
Basophils Relative: 0 % (ref 0.0–3.0)
Eosinophils Relative: 0 % (ref 0.0–5.0)
HEMATOCRIT: 34 % — AB (ref 39.0–52.0)
Hemoglobin: 10.6 g/dL — ABNORMAL LOW (ref 13.0–17.0)
Lymphocytes Relative: 12 % (ref 12.0–46.0)
MCHC: 31.2 g/dL (ref 30.0–36.0)
MCV: 72 fl — ABNORMAL LOW (ref 78.0–100.0)
MONOS PCT: 7 % (ref 3.0–12.0)
Neutrophils Relative %: 81 % — ABNORMAL HIGH (ref 43.0–77.0)
Platelets: 185 10*3/uL (ref 150.0–400.0)
RBC: 4.73 Mil/uL (ref 4.22–5.81)
RDW: 16.1 % — AB (ref 11.5–15.5)
WBC: 8 10*3/uL (ref 4.0–10.5)

## 2014-01-30 LAB — SEDIMENTATION RATE: Sed Rate: 3 mm/hr (ref 0–22)

## 2014-01-30 LAB — C-REACTIVE PROTEIN: CRP: <0.5 mg/dL (ref 0.5–20.0)

## 2014-01-30 NOTE — Telephone Encounter (Signed)
To Triage.

## 2014-01-30 NOTE — Telephone Encounter (Signed)
Patient aware and sent to [facility] for scheduling.

## 2014-01-30 NOTE — Addendum Note (Signed)
Addended by: Alvina Filbert B on: 01/30/2014 03:18 PM   Modules accepted: Orders

## 2014-01-30 NOTE — Telephone Encounter (Signed)
Advised patient

## 2014-01-30 NOTE — Telephone Encounter (Signed)
Left message to call back   Traci R Turner, MD at 01/29/2014 8:29 PM     Status: Signed        Please let patient know that I reviewed his TEE with several of my colleagues. The AV appears to be working fine but there is still an area around where the AV bioprosthesis is sewn in that is unclear as to whether the changes are just post surgical scaring vs. Possible indolent infection with abcess. Please have patient come in for CBC with diff, CRP, ESR and blood cultures x 2. I would like him set up to get a Chest Cardiac CTA with Cardiac morphology and coronary artereis to assess this further. Please set this up for next Wednesday or Thursday with instructions that this needs to be read by Dr. Katarina Nelson     

## 2014-02-05 ENCOUNTER — Telehealth: Payer: Self-pay | Admitting: Cardiology

## 2014-02-05 LAB — CULTURE, BLOOD (SINGLE)
Organism ID, Bacteria: NO GROWTH
Organism ID, Bacteria: NO GROWTH

## 2014-02-05 NOTE — Telephone Encounter (Signed)
New message     Pt is still waiting to see if ins approved procedure.  He said Dr Radford Pax wanted to do the procedure this week.  Please call and give pt an update

## 2014-02-05 NOTE — Telephone Encounter (Signed)
Advised patient that Joshua Hudson [facility] is awaiting insurance approval prior to setting up cardiac CTA and she will call him when she has notification from his insurance company. Patient verbalized understanding.

## 2014-02-11 ENCOUNTER — Encounter: Payer: Self-pay | Admitting: Cardiology

## 2014-02-16 ENCOUNTER — Encounter (INDEPENDENT_AMBULATORY_CARE_PROVIDER_SITE_OTHER): Payer: Self-pay

## 2014-02-16 ENCOUNTER — Ambulatory Visit (HOSPITAL_COMMUNITY)
Admission: RE | Admit: 2014-02-16 | Discharge: 2014-02-16 | Disposition: A | Discharge status: Home / Self Care | Payer: Medicare Other | Source: Ambulatory Visit | Attending: Cardiology | Admitting: Cardiology

## 2014-02-16 DIAGNOSIS — J479 Bronchiectasis, uncomplicated: Secondary | ICD-10-CM | POA: Insufficient documentation

## 2014-02-16 DIAGNOSIS — Z954 Presence of other heart-valve replacement: Secondary | ICD-10-CM | POA: Insufficient documentation

## 2014-02-16 DIAGNOSIS — I059 Rheumatic mitral valve disease, unspecified: Secondary | ICD-10-CM

## 2014-02-16 DIAGNOSIS — I511 Rupture of chordae tendineae, not elsewhere classified: Secondary | ICD-10-CM

## 2014-02-16 MED ORDER — NITROGLYCERIN 0.4 MG SL SUBL
SUBLINGUAL_TABLET | SUBLINGUAL | Status: AC
  Filled 2014-02-16: qty 1

## 2014-02-16 MED ORDER — IOHEXOL 350 MG/ML SOLN
80.0000 mL | Freq: Once | INTRAVENOUS | Status: AC | PRN
  Administered 2014-02-16: 80 mL via INTRAVENOUS

## 2014-02-17 ENCOUNTER — Other Ambulatory Visit: Payer: Self-pay | Admitting: General Surgery

## 2014-02-17 ENCOUNTER — Encounter: Payer: Self-pay | Admitting: Gastroenterology

## 2014-02-17 DIAGNOSIS — R9389 Abnormal findings on diagnostic imaging of other specified body structures: Secondary | ICD-10-CM

## 2014-02-18 ENCOUNTER — Ambulatory Visit (INDEPENDENT_AMBULATORY_CARE_PROVIDER_SITE_OTHER): Payer: Medicare Other | Admitting: Pulmonary Disease

## 2014-02-18 ENCOUNTER — Encounter: Payer: Self-pay | Admitting: Pulmonary Disease

## 2014-02-18 VITALS — BP 108/62 | HR 68 | Temp 98.4°F | Ht 63.0 in | Wt 124.0 lb

## 2014-02-18 DIAGNOSIS — J479 Bronchiectasis, uncomplicated: Secondary | ICD-10-CM | POA: Insufficient documentation

## 2014-02-18 NOTE — Assessment & Plan Note (Signed)
The patient has had a recent cardiac CT that showed an incidental finding of minimal bronchiectasis with some surrounding inflammatory change. The patient is totally asymptomatic, with no cough, mucus production, or frequent pulmonary infections. He is on immunosuppressive medication for his rheumatoid arthritis, but the CT scan does not suggest an ongoing opportunistic infection. At this point, I would not recommend any type of treatment or intervention, unless he becomes more symptomatic. The patient is agreeable to this approach.

## 2014-02-18 NOTE — Patient Instructions (Signed)
You have minimal thickening of your airways in a small area of the lung with some inflammation.  Since you are not symptomatic, this is not an issue for you.   Would be happy to see you again if you begin to develop pulmonary issues such as cough, frequent respiratory infections, or discolored mucus.

## 2014-02-18 NOTE — Progress Notes (Signed)
   Subjective:    Patient ID: Joshua Hudson, male    DOB: 09/04/1935, 79 y.o.   MRN: [id number]  HPI The patient is a 79 year old male who I've been asked to see for an abnormal CT. He has a history of severe aortic stenosis and is status post prosthetic aortic valve many years ago. A question was raised on the echocardiogram of an abnormality, and he subsequently underwent a cardiac CT. This incidentally showed very minimal bronchiectasis primarily in the left upper lobe and superior segment of the left lower lobe, with very mild surrounding inflammatory change. The patient denies any cough or mucus production, nor has he had frequent respiratory infections. He feels that he is totally asymptomatic from a pulmonary standpoint. He is eating well, and maintaining his weight. He has no history of TB exposure.   Review of Systems  Constitutional: Negative for fever and unexpected weight change.  HENT: Negative for congestion, dental problem, ear pain, nosebleeds, postnasal drip, rhinorrhea, sinus pressure, sneezing, sore throat and trouble swallowing.   Eyes: Negative for redness and itching.  Respiratory: Negative for cough, chest tightness, shortness of breath and wheezing.   Cardiovascular: Positive for chest pain. Negative for palpitations and leg swelling.  Gastrointestinal: Negative for nausea and vomiting.  Genitourinary: Negative for dysuria.  Musculoskeletal: Negative for joint swelling.  Skin: Negative for rash.  Neurological: Negative for headaches.  Hematological: Does not bruise/bleed easily.  Psychiatric/Behavioral: Negative for dysphoric mood. The patient is not nervous/anxious.        Objective:   Physical Exam Constitutional:  Well developed, no acute distress  HENT:  Nares patent without discharge  Oropharynx without exudate, palate and uvula are normal  Eyes:  Perrla, eomi, no scleral icterus  Neck:  No JVD, no TMG  Cardiovascular:  Normal rate, regular rhythm, no  rubs or gallops.  Loud rumbling murmur        Intact distal pulses  Pulmonary :  Normal breath sounds, no stridor or respiratory distress   No rales, rhonchi, or wheezing  Abdominal:  Soft, nondistended, bowel sounds present.  No tenderness noted.   Musculoskeletal:  No lower extremity edema noted.  Lymph Nodes:  No cervical lymphadenopathy noted  Skin:  No cyanosis noted  Neurologic:  Alert, appropriate, moves all 4 extremities without obvious deficit.         Assessment & Plan:

## 2014-02-20 ENCOUNTER — Encounter: Payer: Self-pay | Admitting: *Deleted

## 2014-02-25 ENCOUNTER — Ambulatory Visit (INDEPENDENT_AMBULATORY_CARE_PROVIDER_SITE_OTHER): Payer: Medicare Other | Admitting: Gastroenterology

## 2014-02-25 ENCOUNTER — Encounter: Payer: Self-pay | Admitting: Gastroenterology

## 2014-02-25 VITALS — BP 130/74 | HR 72 | Ht 62.0 in | Wt 122.8 lb

## 2014-02-25 DIAGNOSIS — D589 Hereditary hemolytic anemia, unspecified: Secondary | ICD-10-CM | POA: Insufficient documentation

## 2014-02-25 DIAGNOSIS — R1319 Other dysphagia: Secondary | ICD-10-CM

## 2014-02-25 DIAGNOSIS — I5032 Chronic diastolic (congestive) heart failure: Secondary | ICD-10-CM | POA: Insufficient documentation

## 2014-02-25 DIAGNOSIS — R933 Abnormal findings on diagnostic imaging of other parts of digestive tract: Secondary | ICD-10-CM

## 2014-02-25 DIAGNOSIS — I5033 Acute on chronic diastolic (congestive) heart failure: Secondary | ICD-10-CM | POA: Insufficient documentation

## 2014-02-25 DIAGNOSIS — R1310 Dysphagia, unspecified: Secondary | ICD-10-CM | POA: Insufficient documentation

## 2014-02-25 DIAGNOSIS — D509 Iron deficiency anemia, unspecified: Secondary | ICD-10-CM

## 2014-02-25 DIAGNOSIS — K219 Gastro-esophageal reflux disease without esophagitis: Secondary | ICD-10-CM

## 2014-02-25 DIAGNOSIS — I5042 Chronic combined systolic (congestive) and diastolic (congestive) heart failure: Secondary | ICD-10-CM | POA: Insufficient documentation

## 2014-02-25 MED ORDER — MOVIPREP 100 G PO SOLR: 1.0000 | ORAL | Status: DC

## 2014-02-25 MED ORDER — OMEPRAZOLE 40 MG PO CPDR: 40.0000 mg | DELAYED_RELEASE_CAPSULE | Freq: Every day | ORAL | Status: DC

## 2014-02-25 NOTE — Patient Instructions (Addendum)
You have been scheduled for an endoscopy and colonoscopy. Please follow the written instructions given to you at your visit today. We have given you a sample colonoscopy prep.  If you use inhalers (even only as needed), please bring them with you on the day of your procedure. Your physician has requested that you go to www.startemmi.com and enter the access code given to you at your visit today. This web site gives a general overview about your procedure. However, you should still follow specific instructions given to you by our office regarding your preparation for the procedure.  We sent a prescription to [facility] for Omeprazole 40 mg. Take 1 capsule daily in the Am before breakfast.

## 2014-02-25 NOTE — Progress Notes (Signed)
02/25/2014 Joshua Hudson [id number] Oct 21, 1934   HISTORY OF PRESENT ILLNESS:  This is a pleasant 79 year old male with past medical history of prosthetic aortic heart valve placement in 2009, rheumatoid arthritis for which he takes low-dose prednisone chronically and methotrexate with folic acid, and history of perforated diverticulitis with need for sigmoid resection and temporary colostomy in 2003 which was then eventually reversed.  He presents to our office today at the request of Dr. Radford Pax in regards to abnormal findings of the esophagus on CT scan.  He had a CT scan of the chest performed and was found to have circumferential thickening of the distal third of the esophagus questionably reflecting reflux esophagitis versus Barrett's metaplasia or esophageal neoplasia.  He does admit to frequent esophageal reflux, but states that he never takes anything for his symptoms. He also reports dysphagia with difficulty swallowing solid foods in particular. He says that this has been occurring for years but has been getting worse. He has never choked on any food or had to bring it back up; he says that it always eventually goes down if he stops eating and waits for a short time.  He tells me that he previously underwent colonoscopy, but it was prior to his diverticulitis issues in 2003. He thinks he had some polyps removed at that time. He does have a microcytic anemia with most recent hemoglobin at 10.6 grams with MCV of 72. I do not have any iron studies. He denies any dark or bloody stools. He did also denies any bowel issues.  Past Medical History  Diagnosis Date  . Severe aortic stenosis     S/P prosthetic valve replacement w 25 mm Edwards like science percardial tissue valve,Turner  . Rheumatoid arthritis     s/o long term steroids  . Atrial fibrillation     postoperatively w/o recurrence-Turner  . DJD (degenerative joint disease), lumbosacral   . Diverticulosis   . Occipital neuralgia    . Colon polyps     s/p diverticular perforation requiring 2-stage repair  . SBE (subacute bacterial endocarditis) prophylaxis candidate     for dental procedures   Past Surgical History  Procedure Laterality Date  . Cardiac valve replacement  01/2008    aortic valve replacement  . Appendectomy    . Colon resection      diverticulitis   . Tee without cardioversion N/A 01/29/2014    Procedure: TRANSESOPHAGEAL ECHOCARDIOGRAM (TEE);  Surgeon: Sueanne Margarita, MD;  Location: [place];  Service: Cardiovascular;  Laterality: N/A;  . Hernia repair    . Lumbar laminectomy      x 2    reports that he quit smoking about 30 years ago. His smoking use included Cigarettes. He has a 1.5 pack-year smoking history. He has never used smokeless tobacco. He reports that he drinks alcohol. He reports that he does not use illicit drugs. family history includes Heart disease in his father. No Known Allergies    Outpatient Encounter Prescriptions as of 02/25/2014  Medication Sig  . aspirin EC 81 MG EC tablet Take 1 tablet (81 mg total) by mouth daily.  . folic acid (FOLVITE) 1 MG tablet Take 1 mg by mouth daily.  . methotrexate (RHEUMATREX) 2.5 MG tablet Take 25 mg by mouth once a week. Caution:Chemotherapy. Protect from light.  . metoprolol succinate (TOPROL-XL) 25 MG 24 hr tablet Take 25 mg by mouth daily.  . Multiple Vitamin (MULTIVITAMIN) tablet Take 1 tablet by mouth daily.  Marland Kitchen  predniSONE (DELTASONE) 5 MG tablet Take by mouth daily with breakfast. Take 1-2 tablets daily as directed  . sildenafil (VIAGRA) 100 MG tablet Take 50 mg by mouth daily as needed for erectile dysfunction.     REVIEW OF SYSTEMS  : All other systems reviewed and negative except where noted in the History of Present Illness.   PHYSICAL EXAM: BP 130/74  Pulse 72  Ht 5\' 2"  (1.575 m)  Wt 122 lb 12.8 oz (55.702 kg)  BMI 22.45 kg/m2 General: Well developed white male in no acute distress Head: Normocephalic and  atraumatic Eyes:  Sclerae anicteric, conjunctiva pink. Ears: Normal auditory acuity Lungs: Clear throughout to auscultation Heart: Regular rate and rhythm; murmur and valve click noted Abdomen: Soft, non-distended.  Normal bowel sounds.  Non-tender. Rectal:  Deferred.  Will be done at the time of colonoscopy. Musculoskeletal: Symmetrical with no gross deformities  Skin: No lesions on visible extremities Extremities: No edema  Neurological: Alert oriented x 4, grossly non-focal Psychological:  Alert and cooperative. Normal mood and affect  ASSESSMENT AND PLAN: -Abnormal CT scan showing circumferential thickening of the distal third of the esophagus:  Rule out esophagitis vs stricture vs malignancy. -Dysphagia and chronic GERD:  No on medication. -Microcytic anemia:  Hgb 10.6 grams recently.  Denies any overt GI bleeding.  *Will schedule EGD for further evaluation.  In face of anemia and last colonoscopy likely at least 12 years ago,  will schedule colonoscopy as well.  The risks, benefits, and alternatives were discussed with the patient and he consents to proceed.  In the interim we will place him on omeprazole 40 mg daily for UGI symptoms.

## 2014-02-25 NOTE — Progress Notes (Signed)
Reviewed and agree with management plan.  Malcolm T. Stark, MD FACG 

## 2014-02-27 ENCOUNTER — Encounter: Payer: Self-pay | Admitting: Gastroenterology

## 2014-02-27 ENCOUNTER — Ambulatory Visit (AMBULATORY_SURGERY_CENTER): Payer: Medicare Other | Admitting: Gastroenterology

## 2014-02-27 VITALS — BP 138/77 | HR 54 | Temp 97.2°F | Resp 26 | Ht 62.0 in | Wt 122.0 lb

## 2014-02-27 DIAGNOSIS — R933 Abnormal findings on diagnostic imaging of other parts of digestive tract: Secondary | ICD-10-CM

## 2014-02-27 DIAGNOSIS — D509 Iron deficiency anemia, unspecified: Secondary | ICD-10-CM

## 2014-02-27 DIAGNOSIS — K219 Gastro-esophageal reflux disease without esophagitis: Secondary | ICD-10-CM

## 2014-02-27 DIAGNOSIS — R1319 Other dysphagia: Secondary | ICD-10-CM

## 2014-02-27 MED ORDER — SODIUM CHLORIDE 0.9 % IV SOLN: 500.0000 mL | INTRAVENOUS | Status: DC

## 2014-02-27 NOTE — Op Note (Signed)
[place]  [address]. [place], [id number]   COLONOSCOPY PROCEDURE REPORT  PATIENT: Joshua Hudson, Joshua Hudson  MR#: [id number] BIRTHDATE: [birth year] , 79  yrs. old GENDER: Male ENDOSCOPIST: Ladene Artist, MD, [facility] REFERRED TA:VWPV Laurann [place], M.D. PROCEDURE DATE:  02/27/2014 PROCEDURE:   Colonoscopy, diagnostic First Screening Colonoscopy - Avg.  risk and is 50 yrs.  old or older - No.  Prior Negative Screening - Now for repeat screening. N/A  History of Adenoma - Now for follow-up colonoscopy & has been > or = to 3 yrs.  N/A  Polyps Removed Today? No.  Recommend repeat exam, <10 yrs? No. ASA CLASS:   Class III INDICATIONS:Iron Deficiency Anemia. MEDICATIONS: MAC sedation, administered by CRNA and propofol (Diprivan) 200mg  IV DESCRIPTION OF PROCEDURE:   After the risks benefits and alternatives of the procedure were thoroughly explained, informed consent was obtained.  A digital rectal exam revealed no abnormalities of the rectum.   The [id number]  endoscope was introduced through the anus and advanced to the cecum, which was identified by both the appendix and ileocecal valve. No adverse events experienced.   The quality of the prep was good, using MoviPrep. Photo of IC valve did not capture. The instrument was then slowly withdrawn as the colon was fully examined.  COLON FINDINGS: Three non-bleeding AVMs at the cecum. The colon was otherwise normal.  There was no diverticulosis, inflammation, polyps or cancers unless previously stated.  Retroflexed views revealed moderate internal hemorrhoids. The time to cecum=1 minutes 54 seconds.  Withdrawal time=8 minutes 01 seconds.  The scope was withdrawn and the procedure completed.  COMPLICATIONS: There were no complications.  ENDOSCOPIC IMPRESSION: 1.   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   Moderate internal hemorrhoids  RECOMMENDATIONS: 1.  Upper endoscopy today 2.  Given your age, you will not need another  colonoscopy for colon cancer screening or polyp surveillance.  These types of tests usually stop around the age 73. 3.  Suspected chronic blood loss from AVMs. Fe replacement and follow up with his PCP.  eSigned:  Ladene Artist, MD, [facility] 02/27/2014 1:57 PM

## 2014-02-27 NOTE — Patient Instructions (Addendum)
YOU HAD AN ENDOSCOPIC PROCEDURE TODAY AT [facility]: Refer to the procedure report that was given to you for any specific questions about what was found during the examination.  If the procedure report does not answer your questions, please call your gastroenterologist to clarify.  If you requested that your care partner not be given the details of your procedure findings, then the procedure report has been included in a sealed envelope for you to review at your convenience later.  YOU SHOULD EXPECT: Some feelings of bloating in the abdomen. Passage of more gas than usual.  Walking can help get rid of the air that was put into your GI tract during the procedure and reduce the bloating. If you had a lower endoscopy (such as a colonoscopy or flexible sigmoidoscopy) you may notice spotting of blood in your stool or on the toilet paper. If you underwent a bowel prep for your procedure, then you may not have a normal bowel movement for a few days.  DIET:.  You may have clear liquids until 3pm today.  After that, you may proceed to a soft diet for the rest of the day.  You may resume a regular diet tomorrow.  Drink plenty of fluids but you should avoid alcoholic beverages for 24 hours.Try to increase the fiber in your diet.   ACTIVITY: Your care partner should take you home directly after the procedure.  You should plan to take it easy, moving slowly for the rest of the day.  You can resume normal activity the day after the procedure however you should NOT DRIVE or use heavy machinery for 24 hours (because of the sedation medicines used during the test).    SYMPTOMS TO REPORT IMMEDIATELY: A gastroenterologist can be reached at any hour.  During normal business hours, 8:30 AM to 5:00 PM Monday through Friday, call [phone].  After hours and on weekends, please call the GI answering service at [phone] who will take a message and have the physician on call contact you.   Following  lower endoscopy (colonoscopy or flexible sigmoidoscopy):  Excessive amounts of blood in the stool  Significant tenderness or worsening of abdominal pains  Swelling of the abdomen that is new, acute  Fever of 100F or higher  Following upper endoscopy (EGD)  Vomiting of blood or coffee ground material  New chest pain or pain under the shoulder blades  Painful or persistently difficult swallowing  New shortness of breath  Fever of 100F or higher  Black, tarry-looking stools  FOLLOW UP: If any biopsies were taken you will be contacted by phone or by letter within the next 1-3 weeks.  Call your gastroenterologist if you have not heard about the biopsies in 3 weeks.  Our staff will call the home number listed on your records the next business day following your procedure to check on you and address any questions or concerns that you may have at that time regarding the information given to you following your procedure. This is a courtesy call and so if there is no answer at the home number and we have not heard from you through the emergency physician on call, we will assume that you have returned to your regular daily activities without incident.  SIGNATURES/CONFIDENTIALITY: You and/or your care partner have signed paperwork which will be entered into your electronic medical record.  These signatures attest to the fact that that the information above on your After Visit Summary has been reviewed and  is understood.  Full responsibility of the confidentiality of this discharge information lies with you and/or your care-partner.  Please, read the handouts given to you by your recovery room.    Please, start taking iron 325mg  twice daily, and continue your anti-acid medication.

## 2014-02-27 NOTE — Op Note (Addendum)
[place]  [address] [place], [address]   ENDOSCOPY PROCEDURE REPORT  PATIENT: Joshua Hudson, Joshua Hudson  MR#: [id number] BIRTHDATE: Mar 04, 1935 , 79  yrs. old GENDER: Male ENDOSCOPIST: Ladene Artist, MD, [facility] REFERRED BY:  Lavone Orn, M.D. PROCEDURE DATE:  02/27/2014 PROCEDURE:  EGD, diagnostic and Savary dilation of esophagus ASA CLASS:     Class III INDICATIONS:  Dysphagia.   History of esophageal reflux.   abnormal CT of the GI tract. MEDICATIONS: MAC sedation, administered by CRNA, There was residual sedation effect present from prior procedure, and propofol (Diprivan) 120mg  IV TOPICAL ANESTHETIC: none DESCRIPTION OF PROCEDURE: After the risks benefits and alternatives of the procedure were thoroughly explained, informed consent was obtained.  The LB [id number] endoscope was introduced through the mouth and advanced to the second portion of the duodenum. Without limitations.  The instrument was slowly withdrawn as the mucosa was fully examined.  ESOPHAGUS: A smooth stricture was found in the lower third of the esophagus 1-2 cm above the z-line.  The stenosis was traversable with the endoscope.   The esophagus was otherwise normal. STOMACH: The mucosa and folds of the stomach appeared normal. DUODENUM: The duodenal mucosa showed no abnormalities in the bulb and second portion of the duodenum.  Retroflexed views revealed a small hiatal hernia.  A guidewire was placed and the scope was then withdrawn from the patient. 13, 14 and 15 mm Savary dilators were passed with minimal resistance and no heme and the procedure completed.  COMPLICATIONS: There were no complications.  ENDOSCOPIC IMPRESSION: 1.   Stricture in the lower third of the esophagus 2.   Small hiatal hernia  RECOMMENDATIONS: 1.  Anti-reflux regimen 2.  Continue PPI 3.  post dilation instructions  eSigned:  Ladene Artist, MD, [facility] 02/27/2014 2:17 PM Revised: 02/27/2014 2:17  PM

## 2014-02-27 NOTE — Progress Notes (Signed)
The tape from the IV caused three areas of skin breakage.  Dressings were placed on all three areas, and I explained that they might want to put neosporin on the areas. The patient's skin is very thin.  I placed paper tape on the areas.

## 2014-02-27 NOTE — Progress Notes (Signed)
A/ox3, pleased with MAC, report to RN 

## 2014-02-27 NOTE — Progress Notes (Signed)
Called to room to assist during endoscopic procedure.  Patient ID and intended procedure confirmed with present staff. Received instructions for my participation in the procedure from the performing physician.  

## 2014-03-02 ENCOUNTER — Telehealth: Payer: Self-pay | Admitting: *Deleted

## 2014-03-02 NOTE — Telephone Encounter (Signed)
  Follow up Call-  Call back number 02/27/2014  Post procedure Call Back phone  # [phone]  Permission to leave phone message Yes     Patient questions:  Do you have a fever, pain , or abdominal swelling? No. Pain Score  0 *  Have you tolerated food without any problems? Yes.    Have you been able to return to your normal activities? Yes.    Do you have any questions about your discharge instructions: Diet   No. Medications  No. Follow up visit  No.  Do you have questions or concerns about your Care? No.  Actions: * If pain score is 4 or above: No action needed, pain <4.

## 2014-03-03 ENCOUNTER — Telehealth: Payer: Self-pay | Admitting: *Deleted

## 2014-03-03 NOTE — Telephone Encounter (Signed)
Opened in error

## 2014-05-26 ENCOUNTER — Other Ambulatory Visit: Payer: Self-pay | Admitting: *Deleted

## 2014-05-26 MED ORDER — METOPROLOL SUCCINATE ER 25 MG PO TB24: 25.0000 mg | ORAL_TABLET | Freq: Every day | ORAL | Status: DC

## 2014-09-07 ENCOUNTER — Encounter: Payer: Self-pay | Admitting: Hematology & Oncology

## 2014-09-17 ENCOUNTER — Telehealth: Payer: Self-pay | Admitting: Hematology & Oncology

## 2014-09-17 NOTE — Telephone Encounter (Signed)
Left vm w NEW PATIENT today to remind them of their appointment with Dr. Ennever. Also, advised them to bring all medication bottles and insurance card information. ° °

## 2014-09-18 ENCOUNTER — Ambulatory Visit: Payer: Medicare Other

## 2014-09-18 ENCOUNTER — Encounter: Payer: Self-pay | Admitting: Family

## 2014-09-18 ENCOUNTER — Ambulatory Visit (HOSPITAL_BASED_OUTPATIENT_CLINIC_OR_DEPARTMENT_OTHER): Payer: Medicare Other | Admitting: Family

## 2014-09-18 ENCOUNTER — Other Ambulatory Visit: Payer: Medicare Other | Admitting: Lab

## 2014-09-18 VITALS — BP 116/69 | HR 75 | Temp 98.2°F | Resp 16 | Ht 62.0 in | Wt 114.0 lb

## 2014-09-18 DIAGNOSIS — D509 Iron deficiency anemia, unspecified: Secondary | ICD-10-CM

## 2014-09-18 DIAGNOSIS — D638 Anemia in other chronic diseases classified elsewhere: Secondary | ICD-10-CM

## 2014-09-18 LAB — CBC WITH DIFFERENTIAL (CANCER CENTER ONLY)
BASO#: 0 10*3/uL (ref 0.0–0.2)
BASO%: 0.1 % (ref 0.0–2.0)
EOS ABS: 0 10*3/uL (ref 0.0–0.5)
EOS%: 0.1 % (ref 0.0–7.0)
HCT: 34.8 % — ABNORMAL LOW (ref 38.7–49.9)
HEMOGLOBIN: 11.7 g/dL — AB (ref 13.0–17.1)
LYMPH#: 0.6 10*3/uL — AB (ref 0.9–3.3)
LYMPH%: 4.7 % — ABNORMAL LOW (ref 14.0–48.0)
MCH: 23.3 pg — AB (ref 28.0–33.4)
MCHC: 33.6 g/dL (ref 32.0–35.9)
MCV: 69 fL — ABNORMAL LOW (ref 82–98)
MONO#: 0.6 10*3/uL (ref 0.1–0.9)
MONO%: 5.3 % (ref 0.0–13.0)
NEUT#: 10.6 10*3/uL — ABNORMAL HIGH (ref 1.5–6.5)
NEUT%: 89.8 % — AB (ref 40.0–80.0)
PLATELETS: 213 10*3/uL (ref 145–400)
RBC: 5.02 10*6/uL (ref 4.20–5.70)
RDW: 16.9 % — ABNORMAL HIGH (ref 11.1–15.7)
WBC: 11.8 10*3/uL — ABNORMAL HIGH (ref 4.0–10.0)

## 2014-09-18 LAB — FERRITIN CHCC: FERRITIN: 187 ng/mL (ref 22–316)

## 2014-09-18 LAB — IRON AND TIBC CHCC
%SAT: 26 % (ref 20–55)
IRON: 60 ug/dL (ref 42–163)
TIBC: 230 ug/dL (ref 202–409)
UIBC: 170 ug/dL (ref 117–376)

## 2014-09-18 LAB — CHCC SATELLITE - SMEAR

## 2014-09-18 NOTE — Progress Notes (Signed)
Hematology/Oncology Consultation   Name: Joshua Hudson      MRN: [id number]    Location: Room/bed info not found  Date: 09/18/2014 Time:11:39 AM   REFERRING PHYSICIAN:  Mechele Collin. Anderson  REASON FOR CONSULT: Anemia with lymphopenia and poikilocytosis    DIAGNOSIS: Anemia of chronic disease  HISTORY OF PRESENT ILLNESS: Joshua Hudson is a very pleasant 80 yo male with recent diagnosis of anemia with lymphopenia and poikilocytosis. He has had no problem with infections and is asymptomatic at this time.  Today his Hgb is 11.7 MCV 69 lymphocytes 0.6.  He denies fever, chills, n/v, cough, rash, headache, dizziness, SOB, chest pain, palpitations, abdominal pain, constipation, diarrhea, problems urinating, blood in urine or stool. No bleeding or pain. He does not smoke or drink alcohol.  He has no swelling, tenderness, numbness or tingling in his extremities. He has rheumatoid arthritis and has been on both methotrexate and prednisone for 20 or more years.  His appetite is good and her drinks plenty of fluids. His weight is stable at 114.  No personal or familial history of cancer, clotting or bleeding disorders.  His daughter is anemic.  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 He resides in [place].    ROS: All other 10 point review of systems is negative.   PAST MEDICAL HISTORY:   Past Medical History  Diagnosis Date  . Severe aortic stenosis     S/P prosthetic valve replacement w 25 mm Edwards like science percardial tissue valve,Turner  . Rheumatoid arthritis     s/o long term steroids  . Atrial fibrillation     postoperatively w/o recurrence-Turner  . DJD (degenerative joint disease), lumbosacral   . Diverticulosis   . Occipital neuralgia   . Colon polyps     s/p diverticular perforation requiring 2-stage repair  . SBE (subacute bacterial endocarditis) prophylaxis candidate     for dental procedures    ALLERGIES: No Known  Allergies    MEDICATIONS:  Current Outpatient Prescriptions on File Prior to Visit  Medication Sig Dispense Refill  . aspirin EC 81 MG EC tablet Take 1 tablet (81 mg total) by mouth daily. 30 tablet   . folic acid (FOLVITE) 1 MG tablet Take 1 mg by mouth daily.    . methotrexate (RHEUMATREX) 2.5 MG tablet Take 25 mg by mouth once a week. Caution:Chemotherapy. Protect from light.    . metoprolol succinate (TOPROL-XL) 25 MG 24 hr tablet Take 1 tablet (25 mg total) by mouth daily. 90 tablet 1  . Multiple Vitamin (MULTIVITAMIN) tablet Take 1 tablet by mouth daily.    Marland Kitchen omeprazole (PRILOSEC) 40 MG capsule Take 1 capsule (40 mg total) by mouth daily. 90 capsule 3  . predniSONE (DELTASONE) 5 MG tablet Take by mouth daily with breakfast. Take 1-2 tablets daily as directed    . sildenafil (VIAGRA) 100 MG tablet Take 50 mg by mouth daily as needed for erectile dysfunction.     No current facility-administered medications on file prior to visit.     PAST SURGICAL HISTORY Past Surgical History  Procedure Laterality Date  . Cardiac valve replacement  01/2008    aortic valve replacement  . Appendectomy    . Colon resection      diverticulitis   . Tee without cardioversion N/A 01/29/2014    Procedure: TRANSESOPHAGEAL ECHOCARDIOGRAM (TEE);  Surgeon: Sueanne Margarita, MD;  Location: [facility];  Service: Cardiovascular;  Laterality: N/A;  .  Hernia repair    . Lumbar laminectomy      x 2    FAMILY HISTORY: Family History  Problem Relation Age of Onset  . Heart disease Father     SOCIAL HISTORY:  reports that he quit smoking about 31 years ago. His smoking use included Cigarettes. He started smoking about 40 years ago. He has a 5 pack-year smoking history. He has never used smokeless tobacco. He reports that he drinks alcohol. He reports that he does not use illicit drugs.  PERFORMANCE STATUS: The patient's performance status is 0 - Asymptomatic  PHYSICAL EXAM: Most Recent Vital Signs: Blood  pressure 116/69, pulse 75, temperature 98.2 F (36.8 C), temperature source Oral, resp. rate 16, height 5\' 2"  (1.575 m), weight 114 lb (51.71 kg). BP 116/69 mmHg  Pulse 75  Temp(Src) 98.2 F (36.8 C) (Oral)  Resp 16  Ht 5\' 2"  (1.575 m)  Wt 114 lb (51.71 kg)  BMI 20.85 kg/m2  General Appearance:    Alert, cooperative, no distress, appears stated age  Head:    Normocephalic, without obvious abnormality, atraumatic  Eyes:    PERRL, conjunctiva/corneas clear, EOM's intact, fundi    benign, both eyes             Throat:   Lips, mucosa, and tongue normal; teeth and gums normal  Neck:   Supple, symmetrical, trachea midline, no adenopathy;       thyroid:  No enlargement/tenderness/nodules; no carotid   bruit or JVD  Back:     Symmetric, no curvature, ROM normal, no CVA tenderness  Lungs:     Clear to auscultation bilaterally, respirations unlabored  Chest wall:    No tenderness or deformity  Heart:    Regular rate and rhythm, S1 and S2 normal, no murmur, rub   or gallop  Abdomen:     Soft, non-tender, bowel sounds active all four quadrants,    no masses, no organomegaly        Extremities:   Extremities normal, atraumatic, no cyanosis or edema  Pulses:   2+ and symmetric all extremities  Skin:   Skin color, texture, turgor normal, no rashes or lesions  Lymph nodes:   Cervical, supraclavicular, and axillary nodes normal  Neurologic:   CNII-XII intact. Normal strength, sensation and reflexes      throughout    LABORATORY DATA:  Results for orders placed or performed in visit on 09/18/14 (from the past 48 hour(s))  CBC with Differential [facility])     Status: Abnormal   Collection Time: 09/18/14 11:00 AM  Result Value Ref Range   WBC 11.8 (H) 4.0 - 10.0 10e3/uL   RBC 5.02 4.20 - 5.70 10e6/uL   HGB 11.7 (L) 13.0 - 17.1 g/dL   HCT 34.8 (L) 38.7 - 49.9 %   MCV 69 (L) 82 - 98 fL   MCH 23.3 (L) 28.0 - 33.4 pg   MCHC 33.6 32.0 - 35.9 g/dL   RDW 16.9 (H) 11.1 - 15.7 %    Platelets 213 145 - 400 10e3/uL   NEUT# 10.6 (H) 1.5 - 6.5 10e3/uL   LYMPH# 0.6 (L) 0.9 - 3.3 10e3/uL   MONO# 0.6 0.1 - 0.9 10e3/uL   Eosinophils Absolute 0.0 0.0 - 0.5 10e3/uL   BASO# 0.0 0.0 - 0.2 10e3/uL   NEUT% 89.8 (H) 40.0 - 80.0 %   LYMPH% 4.7 (L) 14.0 - 48.0 %   MONO% 5.3 0.0 - 13.0 %   EOS% 0.1 0.0 -  7.0 %   BASO% 0.1 0.0 - 2.0 %  [facility] - Smear     Status: None   Collection Time: 09/18/14 11:00 AM  Result Value Ref Range   Smear Result Smear Available       RADIOGRAPHY: No results found.     PATHOLOGY: None  ASSESSMENT/PLAN: Mr. Parke is a very pleasant 80 yo male with recent diagnosis of anemia with lymphopenia and poikilocytosis. He has had no problem with infections and is asymptomatic at this time.  Today his Hgb is 11.7 MCV 69 lymphocytes 0.6. His smear showed some target cells. We will see what the rest of his labs show.  He takes Folic Acid daily.  At this point we do not need to see him back. We will gladly see him again for any future hematologic issues.  All questions were answered.  The patient was discussed with and also seen by Dr. Marin Olp and he is in agreement with the aforementioned.   [facility]   Addendum:  I saw and examined Mr. Hallenbeck with Judson Roch area did  I looked at his blood smear under the microscope. He had quite a few target cells. There is some slight poikilocytosis. He had no schistocytes or spherocytes. There were no nucleated red blood cells. I do not see any rouleaux formation. White cells showed a few hypersegmented polys. There were no immature myeloid cells. There were no atypical lymphocytes. Platelets look adequate number and size.  I think the anemia is anemia of chronic disease. He has a report arthritis area and he has methotrexate that is taking.  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 I would think that with methotrexate, his MCV would be a lot higher. We are checking him for thalassemia.  Lymphopenia I  think is from chronic steroid use. I don't think this is a clinical issue.  At this point, I just don't think we have to see him back in our clinic. Everything looked pretty much normal on his visual exam. His blood smear looked unremarkable outside of the above changes which I think are from his methotrexate and steroid use.  With him having rheumatoid arthritis, is also would be a factor for anemia.  I told him to switch his Prilosec over to Pepcid. He thinks the prostate might be causing his weight loss.  We spent about 45-50 minutes with him. We answered all his questions. He is a very nice. He served in [employer] and we thanked him for that.  Laurey Arrow

## 2014-09-22 LAB — COMPREHENSIVE METABOLIC PANEL
ALT: 13 U/L (ref 0–53)
AST: 17 U/L (ref 0–37)
Albumin: 3.9 g/dL (ref 3.5–5.2)
Alkaline Phosphatase: 41 U/L (ref 39–117)
BILIRUBIN TOTAL: 0.6 mg/dL (ref 0.2–1.2)
BUN: 16 mg/dL (ref 6–23)
CO2: 28 mEq/L (ref 19–32)
Calcium: 9.3 mg/dL (ref 8.4–10.5)
Chloride: 103 mEq/L (ref 96–112)
Creatinine, Ser: 1.07 mg/dL (ref 0.50–1.35)
GLUCOSE: 89 mg/dL (ref 70–99)
Potassium: 4.8 mEq/L (ref 3.5–5.3)
Sodium: 141 mEq/L (ref 135–145)
Total Protein: 5.9 g/dL — ABNORMAL LOW (ref 6.0–8.3)

## 2014-09-22 LAB — HEMOGLOBINOPATHY EVALUATION
HGB A2 QUANT: 4.8 % — AB (ref 2.2–3.2)
HGB F QUANT: 0.2 % (ref 0.0–2.0)
Hemoglobin Other: 0 %
Hgb A: 95 % — ABNORMAL LOW (ref 96.8–97.8)
Hgb S Quant: 0 %

## 2014-09-22 LAB — RETICULOCYTES (CHCC)
ABS RETIC: 71 10*3/uL (ref 19.0–186.0)
RBC.: 5.07 MIL/uL (ref 4.22–5.81)
Retic Ct Pct: 1.4 % (ref 0.4–2.3)

## 2014-09-22 LAB — ERYTHROPOIETIN: ERYTHROPOIETIN: 26.7 m[IU]/mL — AB (ref 2.6–18.5)

## 2014-09-25 LAB — ALPHA-THALASSEMIA GENOTYPR

## 2014-11-16 ENCOUNTER — Other Ambulatory Visit: Payer: Self-pay | Admitting: Cardiology

## 2015-02-20 ENCOUNTER — Other Ambulatory Visit: Payer: Self-pay | Admitting: Cardiology

## 2015-02-23 ENCOUNTER — Other Ambulatory Visit: Payer: Self-pay | Admitting: Cardiology

## 2015-02-23 MED ORDER — METOPROLOL SUCCINATE ER 25 MG PO TB24: 25.0000 mg | ORAL_TABLET | Freq: Every day | ORAL | Status: DC

## 2015-05-10 ENCOUNTER — Encounter: Payer: Self-pay | Admitting: Cardiology

## 2015-05-10 DIAGNOSIS — I9789 Other postprocedural complications and disorders of the circulatory system, not elsewhere classified: Secondary | ICD-10-CM

## 2015-05-10 DIAGNOSIS — I480 Paroxysmal atrial fibrillation: Secondary | ICD-10-CM | POA: Insufficient documentation

## 2015-05-10 DIAGNOSIS — I4891 Unspecified atrial fibrillation: Secondary | ICD-10-CM

## 2015-05-10 DIAGNOSIS — I2510 Atherosclerotic heart disease of native coronary artery without angina pectoris: Secondary | ICD-10-CM | POA: Insufficient documentation

## 2015-05-10 HISTORY — DX: Unspecified atrial fibrillation: I48.91

## 2015-05-10 NOTE — Progress Notes (Signed)
Cardiology Office Note   Date:  05/11/2015   ID:  Joshua Hudson, DOB 08-17-1935, MRN [id number]  PCP:  Irven Shelling, MD    Chief Complaint  Patient presents with  . S/P AVR      History of Present Illness: Joshua Hudson is a 80 y.o. male with a history of severe AS s/p prosthetic AVR and post op afib with no reoccurence who presents today for followup. He is doing well. He denies any chest pain, SOB, DOE, LE edema, dizziness, palpitations or syncope.    Past Medical History  Diagnosis Date  . Severe aortic stenosis     S/P prosthetic valve replacement w 25 mm Edwards like science percardial tissue valve,[name]  . Rheumatoid arthritis     s/o long term steroids  . DJD (degenerative joint disease), lumbosacral   . Diverticulosis   . Occipital neuralgia   . Colon polyps     s/p diverticular perforation requiring 2-stage repair  . SBE (subacute bacterial endocarditis) prophylaxis candidate     for dental procedures  . Coronary artery disease     nonobstructive of all 3 vessels by coronary CTA  . Postoperative atrial fibrillation 05/10/2015    Past Surgical History  Procedure Laterality Date  . Cardiac valve replacement  01/2008    aortic valve replacement  . Appendectomy    . Colon resection      diverticulitis   . Tee without cardioversion N/A 01/29/2014    Procedure: TRANSESOPHAGEAL ECHOCARDIOGRAM (TEE);  Surgeon: Sueanne Margarita, MD;  Location: [facility];  Service: Cardiovascular;  Laterality: N/A;  . Hernia repair    . Lumbar laminectomy      x 2     Current Outpatient Prescriptions  Medication Sig Dispense Refill  . aspirin EC 81 MG EC tablet Take 1 tablet (81 mg total) by mouth daily. 30 tablet   . folic acid (FOLVITE) 1 MG tablet Take 1 mg by mouth daily.    . methotrexate (RHEUMATREX) 2.5 MG tablet Take 25 mg by mouth once a week. Caution:Chemotherapy. Protect from light.    . metoprolol succinate (TOPROL-XL) 25 MG 24 hr  tablet Take 1 tablet (25 mg total) by mouth daily. 30 tablet 0  . Multiple Vitamin (MULTIVITAMIN) tablet Take 1 tablet by mouth daily.    . predniSONE (DELTASONE) 5 MG tablet Take by mouth daily with breakfast. Take 1-2 tablets daily as directed    . valACYclovir (VALTREX) 500 MG tablet Take 500 mg by mouth daily.     No current facility-administered medications for this visit.    Allergies:   Review of patient's allergies indicates no known allergies.    Social History:  The patient  reports that he quit smoking about 31 years ago. His smoking use included Cigarettes. He started smoking about 41 years ago. He has a 5 pack-year smoking history. He has never used smokeless tobacco. He reports that he drinks alcohol. He reports that he does not use illicit drugs.   Family History:  The patient's family history includes Arthritis in his father; Heart Problems in his sister; Heart disease in his father; Other in his brother, daughter, mother, and sister; Suicidality in his brother.    ROS:  Please see the history of present illness.   Otherwise, review of systems are positive for back and joint.   All other systems are reviewed and negative.  PHYSICAL EXAM: VS:  BP 119/64 mmHg  Pulse 55  Ht 5\' 2"  (1.575 m)  Wt 115 lb 6.4 oz (52.345 kg)  BMI 21.10 kg/m2 , BMI Body mass index is 21.1 kg/(m^2). GEN: Well nourished, well developed, in no acute distress HEENT: normal Neck: no JVD, carotid bruits, or masses Cardiac: RRR; no murmurs, rubs, or gallops,no edema  Respiratory:  clear to auscultation bilaterally, normal work of breathing GI: soft, nontender, nondistended, + BS MS: no deformity or atrophy Skin: warm and dry, no rash Neuro:  Strength and sensation are intact Psych: euthymic mood, full affect   EKG:  EKG is ordered today. The ekg ordered today demonstrates sinus bradycardia at 55bpm with no ST changes   Recent Labs: 09/18/2014: ALT 13; BUN 16; Creatinine, Ser 1.07; HGB 11.7*;  Platelets 213; Potassium 4.8; Sodium 141    Lipid Panel    Component Value Date/Time   CHOL  01/14/2008 0450    174        ATP III CLASSIFICATION:  <200     mg/dL   Desirable  200-239  mg/dL   Borderline High  >=240    mg/dL   High   TRIG 43 01/14/2008 0450   HDL 65 01/14/2008 0450   CHOLHDL 2.7 01/14/2008 0450   VLDL 9 01/14/2008 0450   LDLCALC * 01/14/2008 0450    100        Total Cholesterol/HDL:CHD Risk Coronary Heart Disease Risk Table                     Men   Women  1/2 Average Risk   3.4   3.3      Wt Readings from Last 3 Encounters:  05/11/15 115 lb 6.4 oz (52.345 kg)  09/18/14 114 lb (51.71 kg)  02/27/14 122 lb (55.339 kg)     ASSESSMENT AND PLAN: 1.  Severe AS/ s/p Pericardia tissue AVR and doing well  - continue ASA 2.  Nonobstructive CAD by coronary CTA - continue ASA.  Needs aggressive risk factor modification.  Check FLP from PCP. 3.  Postop atrial fibrillation with no reoccurence 4.  Asymptomatic bradycardia   Current medicines are reviewed at length with the patient today.  The patient does not have concerns regarding medicines.  The following changes have been made:  no change  Labs/ tests ordered today: See above Assessment and Plan No orders of the defined types were placed in this encounter.     Disposition:   FU with me in 1 year  Signed, Sueanne Margarita, MD  05/11/2015 8:39 AM    [facility] [address], [place], [place]  [address] Phone: [phone]; Fax: [phone]

## 2015-05-11 ENCOUNTER — Ambulatory Visit (INDEPENDENT_AMBULATORY_CARE_PROVIDER_SITE_OTHER): Payer: Medicare Other | Admitting: Cardiology

## 2015-05-11 ENCOUNTER — Encounter: Payer: Self-pay | Admitting: Cardiology

## 2015-05-11 VITALS — BP 119/64 | HR 55 | Ht 62.0 in | Wt 115.4 lb

## 2015-05-11 DIAGNOSIS — Z954 Presence of other heart-valve replacement: Secondary | ICD-10-CM | POA: Diagnosis not present

## 2015-05-11 DIAGNOSIS — I4891 Unspecified atrial fibrillation: Secondary | ICD-10-CM

## 2015-05-11 DIAGNOSIS — I350 Nonrheumatic aortic (valve) stenosis: Secondary | ICD-10-CM | POA: Diagnosis not present

## 2015-05-11 DIAGNOSIS — I9789 Other postprocedural complications and disorders of the circulatory system, not elsewhere classified: Secondary | ICD-10-CM

## 2015-05-11 DIAGNOSIS — Z952 Presence of prosthetic heart valve: Secondary | ICD-10-CM

## 2015-05-11 MED ORDER — METOPROLOL SUCCINATE ER 25 MG PO TB24: 25.0000 mg | ORAL_TABLET | Freq: Every day | ORAL | Status: DC

## 2015-05-11 NOTE — Patient Instructions (Signed)

## 2015-05-27 ENCOUNTER — Encounter: Payer: Self-pay | Admitting: Cardiology

## 2016-04-19 ENCOUNTER — Other Ambulatory Visit: Payer: Self-pay | Admitting: Cardiology

## 2016-04-19 ENCOUNTER — Other Ambulatory Visit: Payer: Self-pay | Admitting: *Deleted

## 2016-04-19 MED ORDER — METOPROLOL SUCCINATE ER 25 MG PO TB24: 25.0000 mg | ORAL_TABLET | Freq: Every day | ORAL | 0 refills | Status: DC

## 2016-05-01 ENCOUNTER — Encounter: Payer: Self-pay | Admitting: Cardiology

## 2016-05-10 ENCOUNTER — Ambulatory Visit: Payer: Medicare Other | Admitting: Cardiology

## 2016-05-16 ENCOUNTER — Ambulatory Visit (INDEPENDENT_AMBULATORY_CARE_PROVIDER_SITE_OTHER): Payer: Medicare Other | Admitting: Cardiology

## 2016-05-16 ENCOUNTER — Encounter: Payer: Self-pay | Admitting: Cardiology

## 2016-05-16 VITALS — BP 120/64 | HR 60 | Resp 18 | Ht 63.0 in | Wt 113.0 lb

## 2016-05-16 DIAGNOSIS — I2583 Coronary atherosclerosis due to lipid rich plaque: Principal | ICD-10-CM

## 2016-05-16 DIAGNOSIS — I350 Nonrheumatic aortic (valve) stenosis: Secondary | ICD-10-CM | POA: Diagnosis not present

## 2016-05-16 DIAGNOSIS — I2510 Atherosclerotic heart disease of native coronary artery without angina pectoris: Secondary | ICD-10-CM

## 2016-05-16 DIAGNOSIS — I340 Nonrheumatic mitral (valve) insufficiency: Secondary | ICD-10-CM | POA: Diagnosis not present

## 2016-05-16 HISTORY — DX: Nonrheumatic mitral (valve) insufficiency: I34.0

## 2016-05-16 NOTE — Patient Instructions (Signed)
Medication Instructions:  Your physician recommends that you continue on your current medications as directed. Please refer to the Current Medication list given to you today.   Labwork: None  Testing/Procedures: Your physician has requested that you have an echocardiogram. Echocardiography is a painless test that uses sound waves to create images of your heart. It provides your doctor with information about the size and shape of your heart and how well your heart's chambers and valves are working. This procedure takes approximately one hour. There are no restrictions for this procedure.  Follow-Up: Your physician wants you to follow-up in: 1 year with Dr. Turner. You will receive a reminder letter in the mail two months in advance. If you don't receive a letter, please call our office to schedule the follow-up appointment.   Any Other Special Instructions Will Be Listed Below (If Applicable).     If you need a refill on your cardiac medications before your next appointment, please call your pharmacy.   

## 2016-05-16 NOTE — Progress Notes (Signed)
Cardiology Office Note    Date:  05/16/2016   ID:  Joshua Hudson, DOB Aug 15, 1935, MRN [id number]  PCP:  Irven Shelling, MD  Cardiologist:  [name], MD   Chief Complaint  Patient presents with  . Coronary Artery Disease  . Aortic Stenosis    History of Present Illness:  Joshua Hudson is a 81 y.o. male with a history of severe AS s/p prosthetic AVR and post op afib with no reoccurence who presents today for followup. He is doing well. He denies any chest pain, SOB, DOE, LE edema, dizziness, palpitations or syncope.    Past Medical History:  Diagnosis Date  . Colon polyps    s/p diverticular perforation requiring 2-stage repair  . Coronary artery disease    nonobstructive of all 3 vessels by coronary CTA  . Diverticulosis   . DJD (degenerative joint disease), lumbosacral   . Mitral regurgitation 05/16/2016  . Occipital neuralgia   . Postoperative atrial fibrillation ([place]) 05/10/2015  . Rheumatoid arthritis ([place])    s/o long term steroids  . SBE (subacute bacterial endocarditis) prophylaxis candidate    for dental procedures  . Severe aortic stenosis    S/P prosthetic valve replacement w 25 mm Edwards like science percardial tissue valve,Shantice Menger    Past Surgical History:  Procedure Laterality Date  . APPENDECTOMY    . CARDIAC VALVE REPLACEMENT  01/2008   aortic valve replacement  . COLON RESECTION     diverticulitis   . HERNIA REPAIR    . LUMBAR LAMINECTOMY     x 2  . TEE WITHOUT CARDIOVERSION N/A 01/29/2014   Procedure: TRANSESOPHAGEAL ECHOCARDIOGRAM (TEE);  Surgeon: Sueanne Margarita, MD;  Location: [facility] ENDOSCOPY;  Service: Cardiovascular;  Laterality: N/A;    Current Medications: Outpatient Medications Prior to Visit  Medication Sig Dispense Refill  . aspirin EC 81 MG EC tablet Take 1 tablet (81 mg total) by mouth daily. 30 tablet   . folic acid (FOLVITE) 1 MG tablet Take 1 mg by mouth daily.    . methotrexate (RHEUMATREX) 2.5 MG tablet Take 25 mg by  mouth once a week. Caution:Chemotherapy. Protect from light.    . metoprolol succinate (TOPROL-XL) 25 MG 24 hr tablet Take 1 tablet (25 mg total) by mouth daily. 30 tablet 0  . Multiple Vitamin (MULTIVITAMIN) tablet Take 1 tablet by mouth daily.    . predniSONE (DELTASONE) 5 MG tablet Take by mouth daily with breakfast. Take 1-2 tablets daily as directed    . valACYclovir (VALTREX) 500 MG tablet Take 500 mg by mouth daily.     No facility-administered medications prior to visit.      Allergies:   Review of patient's allergies indicates no known allergies.   Social History   Social History  . Marital status: Married    Spouse name: N/A  . Number of children: 2  . Years of education: N/A   Occupational History  . 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 Smoking status: Former Smoker    Packs/day: 0.50    Years: 10.00    Types: Cigarettes    Start date: 01/16/1974    Quit date: 09/05/1983  . Smokeless tobacco: Never Used     Comment: quit 30 years ago  . Alcohol use 0.0 oz/week     Comment: rare  . Drug use: No  . Sexual activity: Not on file   Other Topics Concern  . Not on file  Social History Narrative  . No narrative on file     Family History:  The patient's family history includes Arthritis in his father; Heart Problems in his sister; Heart disease in his father; Other in his brother, daughter, mother, and sister; Suicidality in his brother.   ROS:   Please see the history of present illness.    ROS All other systems reviewed and are negative.   PHYSICAL EXAM:   VS:  BP 120/64   Pulse 60   Resp 18   Ht 5\' 3"  (1.6 m)   Wt 113 lb (51.3 kg)   BMI 20.02 kg/m    GEN: Well nourished, well developed, in no acute distress  HEENT: normal  Neck: no JVD, carotid bruits, or masses Cardiac: RRR; no murmurs, rubs, or gallops,no edema.  Intact distal pulses bilaterally.  Respiratory:  clear to auscultation bilaterally, normal work of breathing GI: soft,  nontender, nondistended, + BS MS: no deformity or atrophy  Skin: warm and dry, no rash Neuro:  Alert and Oriented x 3, Strength and sensation are intact Psych: euthymic mood, full affect  Wt Readings from Last 3 Encounters:  05/16/16 113 lb (51.3 kg)  05/11/15 115 lb 6.4 oz (52.3 kg)  09/18/14 114 lb (51.7 kg)      Studies/Labs Reviewed:   EKG:  EKG is ordered today.  The ekg ordered today demonstrates NSR with no ST changes  Recent Labs: No results found for requested labs within last 8760 hours.   Lipid Panel    Component Value Date/Time   CHOL  01/14/2008 0450    174        ATP III CLASSIFICATION:  <200     mg/dL   Desirable  200-239  mg/dL   Borderline High  >=240    mg/dL   High   TRIG 43 01/14/2008 0450   HDL 65 01/14/2008 0450   CHOLHDL 2.7 01/14/2008 0450   VLDL 9 01/14/2008 0450   LDLCALC (H) 01/14/2008 0450    100        Total Cholesterol/HDL:CHD Risk Coronary Heart Disease Risk Table                     Men   Women  1/2 Average Risk   3.4   3.3    Additional studies/ records that were reviewed today include:  none    ASSESSMENT:    1. Coronary artery disease due to lipid rich plaque   2. Severe aortic stenosis   3. Mitral regurgitation      PLAN:  In order of problems listed above:  1.  Nonobstructive ASCAD with no angina.  Continue ASA and BB.   2.  Severe AS s/p bioprosthetic AVR.  Continue ASA. 3.  Mild to moderate MR on TEE 2015 - will repeat to make sure MR has not increased.  I do not hear a murmur on exam today.     Medication Adjustments/Labs and Tests Ordered: Current medicines are reviewed at length with the patient today.  Concerns regarding medicines are outlined above.  Medication changes, Labs and Tests ordered today are listed in the Patient Instructions below.  There are no Patient Instructions on file for this visit.   Signed, [name], MD  05/16/2016 1:26 PM    [facility] [address], [place], [place]  [address] Phone: [phone]; Fax: [phone]

## 2016-06-01 ENCOUNTER — Encounter: Payer: Self-pay | Admitting: Cardiology

## 2016-06-01 ENCOUNTER — Other Ambulatory Visit: Payer: Self-pay

## 2016-06-01 ENCOUNTER — Ambulatory Visit (HOSPITAL_COMMUNITY): Payer: Medicare Other | Attending: Cardiology

## 2016-06-01 DIAGNOSIS — Z87891 Personal history of nicotine dependence: Secondary | ICD-10-CM | POA: Diagnosis not present

## 2016-06-01 DIAGNOSIS — I4891 Unspecified atrial fibrillation: Secondary | ICD-10-CM | POA: Insufficient documentation

## 2016-06-01 DIAGNOSIS — I059 Rheumatic mitral valve disease, unspecified: Secondary | ICD-10-CM | POA: Diagnosis present

## 2016-06-01 DIAGNOSIS — I350 Nonrheumatic aortic (valve) stenosis: Secondary | ICD-10-CM | POA: Diagnosis not present

## 2016-06-01 DIAGNOSIS — Z953 Presence of xenogenic heart valve: Secondary | ICD-10-CM | POA: Insufficient documentation

## 2016-06-01 DIAGNOSIS — I352 Nonrheumatic aortic (valve) stenosis with insufficiency: Secondary | ICD-10-CM | POA: Diagnosis not present

## 2016-06-01 DIAGNOSIS — I340 Nonrheumatic mitral (valve) insufficiency: Secondary | ICD-10-CM

## 2016-06-01 DIAGNOSIS — I2510 Atherosclerotic heart disease of native coronary artery without angina pectoris: Secondary | ICD-10-CM | POA: Diagnosis not present

## 2016-06-01 DIAGNOSIS — Z8249 Family history of ischemic heart disease and other diseases of the circulatory system: Secondary | ICD-10-CM | POA: Insufficient documentation

## 2016-06-01 DIAGNOSIS — I517 Cardiomegaly: Secondary | ICD-10-CM | POA: Diagnosis not present

## 2016-06-13 ENCOUNTER — Other Ambulatory Visit: Payer: Self-pay | Admitting: Cardiology

## 2017-05-15 NOTE — Progress Notes (Signed)
Cardiology Office Note:    Date:  05/21/2017   ID:  Joshua Hudson, DOB 11/20/1934, MRN [id number]  PCP:  Lavone Orn, MD  Cardiologist:  [name], MD   Referring MD: Lavone Orn, MD   Chief Complaint  Patient presents with  . Coronary Artery Disease  . Aortic Stenosis    History of Present Illness:    Joshua Hudson is a 82 y.o. male with a hx of severe AS s/p prosthetic AVR and post op afib with no reoccurence.  He is here today for followup and is doing well.  He denies any chest pain or pressure, SOB, DOE, PND, orthopnea, LE edema, dizziness, palptiations or syncope.     Past Medical History:  Diagnosis Date  . Colon polyps    s/p diverticular perforation requiring 2-stage repair  . Coronary artery disease    nonobstructive of all 3 vessels by coronary CTA  . Diverticulosis   . DJD (degenerative joint disease), lumbosacral   . Mitral regurgitation 05/16/2016   mild by echo 05/2016  . Occipital neuralgia   . Postoperative atrial fibrillation ([place]) 05/10/2015  . Rheumatoid arthritis ([place])    s/o long term steroids  . SBE (subacute bacterial endocarditis) prophylaxis candidate    for dental procedures  . Severe aortic stenosis    S/P prosthetic valve replacement w 25 mm Edwards like science percardial tissue valve,[place]    Past Surgical History:  Procedure Laterality Date  . APPENDECTOMY    . CARDIAC VALVE REPLACEMENT  01/2008   aortic valve replacement  . COLON RESECTION     diverticulitis   . HERNIA REPAIR    . LUMBAR LAMINECTOMY     x 2  . TEE WITHOUT CARDIOVERSION N/A 01/29/2014   Procedure: TRANSESOPHAGEAL ECHOCARDIOGRAM (TEE);  Surgeon: Sueanne Margarita, MD;  Location: [facility] ENDOSCOPY;  Service: Cardiovascular;  Laterality: N/A;    Current Medications: Current Meds  Medication Sig  . aspirin EC 81 MG EC tablet Take 1 tablet (81 mg total) by mouth daily.  . Cholecalciferol (VITAMIN D3) 1000 units CAPS Take 1 capsule by mouth daily.  . folic acid  (FOLVITE) 1 MG tablet Take 1 mg by mouth daily.  Marland Kitchen KRILL OIL PO Take 750 mg by mouth daily.  Marland Kitchen leflunomide (ARAVA) 20 MG tablet Take 20 mg by mouth daily.  . methotrexate (RHEUMATREX) 2.5 MG tablet Take 25 mg by mouth once a week. Caution:Chemotherapy. Protect from light.  . metoprolol succinate (TOPROL-XL) 25 MG 24 hr tablet TAKE ONE TABLET BY MOUTH ONCE DAILY  . Multiple Vitamin (MULTIVITAMIN) tablet Take 1 tablet by mouth daily.  . pantoprazole (PROTONIX) 20 MG tablet Take 20 mg by mouth daily.  . predniSONE (DELTASONE) 5 MG tablet Take by mouth daily with breakfast. Take 1-2 tablets daily as directed  . Turmeric 500 MG TABS Take 500 mg by mouth daily.  . vitamin B-12 (CYANOCOBALAMIN) 1000 MCG tablet Take 1,000 mcg by mouth daily.     Allergies:   Patient has no known allergies.   Social History   Social History  . Marital status: Married    Spouse name: N/A  . Number of children: 2  . Years of education: N/A   Occupational History  . 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 Smoking status: Former Smoker    Packs/day: 0.50    Years: 10.00    Types: Cigarettes    Start date: 01/16/1974    Quit date:  09/05/1983  . Smokeless tobacco: Never Used     Comment: quit 30 years ago  . Alcohol use 0.0 oz/week     Comment: rare  . Drug use: No  . Sexual activity: Not Asked   Other Topics Concern  . None   Social History Narrative  . None     Family History: The patient's family history includes Arthritis in his father; Heart Problems in his sister; Heart disease in his father; Other in his brother, daughter, mother, and sister; Suicidality in his brother.  ROS:   Please see the history of present illness.     All other systems reviewed and are negative.  EKGs/Labs/Other Studies Reviewed:    The following studies were reviewed today: EKG  EKG:  EKG is  ordered today.  The ekg ordered today demonstrates sinus bradycardia at 59bpm with nonspecific  IVCD  Recent Labs: No results found for requested labs within last 8760 hours.   Recent Lipid Panel    Component Value Date/Time   CHOL  01/14/2008 0450    174        ATP III CLASSIFICATION:  <200     mg/dL   Desirable  200-239  mg/dL   Borderline High  >=240    mg/dL   High   TRIG 43 01/14/2008 0450   HDL 65 01/14/2008 0450   CHOLHDL 2.7 01/14/2008 0450   VLDL 9 01/14/2008 0450   LDLCALC (H) 01/14/2008 0450    100        Total Cholesterol/HDL:CHD Risk Coronary Heart Disease Risk Table                     Men   Women  1/2 Average Risk   3.4   3.3    Physical Exam:    VS:  BP 102/78   Pulse (!) 59   Ht 5\' 3"  (1.6 m)   Wt 106 lb (48.1 kg)   BMI 18.78 kg/m     Wt Readings from Last 3 Encounters:  05/21/17 106 lb (48.1 kg)  05/16/16 113 lb (51.3 kg)  05/11/15 115 lb 6.4 oz (52.3 kg)     GEN:  Well nourished, well developed in no acute distress HEENT: Normal NECK: No JVD; No carotid bruits LYMPHATICS: No lymphadenopathy CARDIAC: RRR, no murmurs, rubs, gallops RESPIRATORY:  Clear to auscultation without rales, wheezing or rhonchi  ABDOMEN: Soft, non-tender, non-distended MUSCULOSKELETAL:  No edema; No deformity  SKIN: Warm and dry NEUROLOGIC:  Alert and oriented x 3 PSYCHIATRIC:  Normal affect   ASSESSMENT:    1. Coronary artery disease involving native coronary artery of native heart without angina pectoris   2. Severe aortic stenosis   3. Non-rheumatic mitral regurgitation    PLAN:    In order of problems listed above:  1. ASCAD - nonobstructive by coronary CTA - he has no angina symptoms.  He will continue on ASA 81mg  dailky 2. Severe AS s/p bioprosthetic AVR- stable by echo a year ago. 3. Mild MR by echo 2017   Medication Adjustments/Labs and Tests Ordered: Current medicines are reviewed at length with the patient today.  Concerns regarding medicines are outlined above.  No orders of the defined types were placed in this encounter.  No orders of  the defined types were placed in this encounter.   Signed, [name], MD  05/21/2017 7:46 AM    [place]

## 2017-05-21 ENCOUNTER — Encounter: Payer: Self-pay | Admitting: Cardiology

## 2017-05-21 ENCOUNTER — Ambulatory Visit (INDEPENDENT_AMBULATORY_CARE_PROVIDER_SITE_OTHER): Payer: Medicare Other | Admitting: Cardiology

## 2017-05-21 ENCOUNTER — Encounter (INDEPENDENT_AMBULATORY_CARE_PROVIDER_SITE_OTHER): Payer: Self-pay

## 2017-05-21 VITALS — BP 102/78 | HR 59 | Ht 63.0 in | Wt 106.0 lb

## 2017-05-21 DIAGNOSIS — I2510 Atherosclerotic heart disease of native coronary artery without angina pectoris: Secondary | ICD-10-CM

## 2017-05-21 DIAGNOSIS — I350 Nonrheumatic aortic (valve) stenosis: Secondary | ICD-10-CM

## 2017-05-21 DIAGNOSIS — I340 Nonrheumatic mitral (valve) insufficiency: Secondary | ICD-10-CM | POA: Diagnosis not present

## 2017-05-21 NOTE — Patient Instructions (Signed)
Medication Instructions:  Your physician recommends that you continue on your current medications as directed. Please refer to the Current Medication list given to you today.   Labwork: None Ordered   Testing/Procedures: None Ordered   Follow-Up: Your physician wants you to follow-up in: 1 year with Dr. Radford Pax.  You will receive a reminder letter in the mail two months in advance. If you don't receive a letter, please call our office to schedule the follow-up appointment.   If you need a refill on your cardiac medications before your next appointment, please call your pharmacy.   Thank you for choosing [facility]! Christen Bame, RN [phone]

## 2017-06-18 ENCOUNTER — Other Ambulatory Visit: Payer: Self-pay | Admitting: Cardiology

## 2017-10-24 ENCOUNTER — Other Ambulatory Visit: Payer: Self-pay | Admitting: Gastroenterology

## 2017-11-06 NOTE — Progress Notes (Signed)
Confirmed esophageal manometry with pt tomorrow at 1030. Pt confirmed he will be here.

## 2017-11-07 ENCOUNTER — Ambulatory Visit (HOSPITAL_COMMUNITY)
Admission: RE | Admit: 2017-11-07 | Discharge: 2017-11-07 | Disposition: A | Discharge status: Home / Self Care | Payer: Medicare Other | Source: Ambulatory Visit | Attending: Gastroenterology | Admitting: Gastroenterology

## 2017-11-07 ENCOUNTER — Encounter (HOSPITAL_COMMUNITY)
Admission: RE | Disposition: A | Discharge status: Home / Self Care | Payer: Self-pay | Source: Ambulatory Visit | Attending: Gastroenterology

## 2017-11-07 ENCOUNTER — Encounter (HOSPITAL_COMMUNITY): Payer: Self-pay | Admitting: *Deleted

## 2017-11-07 DIAGNOSIS — R1310 Dysphagia, unspecified: Secondary | ICD-10-CM | POA: Diagnosis not present

## 2017-11-07 HISTORY — PX: ESOPHAGEAL MANOMETRY: SHX5429

## 2017-11-07 SURGERY — MANOMETRY, ESOPHAGUS
Anesthesia: Moderate Sedation

## 2017-11-07 MED ORDER — LIDOCAINE VISCOUS 2 % MT SOLN
OROMUCOSAL | Status: AC
Start: 2017-11-07 — End: ?
  Filled 2017-11-07: qty 15

## 2017-11-07 SURGICAL SUPPLY — 2 items
FACESHIELD LNG OPTICON STERILE (SAFETY) IMPLANT
GLOVE BIO SURGEON STRL SZ8 (GLOVE) ×6 IMPLANT

## 2017-11-07 NOTE — Progress Notes (Signed)
Esophageal Manometry done per protocol.  Patient tolerated well.  Dr. Michail Sermon to be notified of study to be read.

## 2017-11-08 ENCOUNTER — Encounter (HOSPITAL_COMMUNITY): Payer: Self-pay | Admitting: Gastroenterology

## 2017-11-20 ENCOUNTER — Other Ambulatory Visit: Payer: Self-pay | Admitting: Gastroenterology

## 2018-01-21 ENCOUNTER — Encounter (HOSPITAL_COMMUNITY): Payer: Self-pay | Admitting: *Deleted

## 2018-01-21 ENCOUNTER — Other Ambulatory Visit: Payer: Self-pay

## 2018-01-22 ENCOUNTER — Encounter (HOSPITAL_COMMUNITY)
Admission: RE | Disposition: A | Discharge status: Home / Self Care | Payer: Self-pay | Source: Ambulatory Visit | Attending: Gastroenterology

## 2018-01-22 ENCOUNTER — Ambulatory Visit (HOSPITAL_COMMUNITY)
Admission: RE | Admit: 2018-01-22 | Discharge: 2018-01-22 | Disposition: A | Discharge status: Home / Self Care | Payer: Medicare Other | Source: Ambulatory Visit | Attending: Gastroenterology | Admitting: Gastroenterology

## 2018-01-22 ENCOUNTER — Encounter (HOSPITAL_COMMUNITY): Payer: Self-pay | Admitting: *Deleted

## 2018-01-22 ENCOUNTER — Ambulatory Visit (HOSPITAL_COMMUNITY): Payer: Medicare Other | Admitting: Anesthesiology

## 2018-01-22 ENCOUNTER — Other Ambulatory Visit: Payer: Self-pay

## 2018-01-22 DIAGNOSIS — M069 Rheumatoid arthritis, unspecified: Secondary | ICD-10-CM | POA: Diagnosis not present

## 2018-01-22 DIAGNOSIS — K222 Esophageal obstruction: Secondary | ICD-10-CM | POA: Diagnosis present

## 2018-01-22 DIAGNOSIS — Z952 Presence of prosthetic heart valve: Secondary | ICD-10-CM | POA: Diagnosis not present

## 2018-01-22 DIAGNOSIS — Z79899 Other long term (current) drug therapy: Secondary | ICD-10-CM | POA: Diagnosis not present

## 2018-01-22 DIAGNOSIS — Z9089 Acquired absence of other organs: Secondary | ICD-10-CM | POA: Insufficient documentation

## 2018-01-22 DIAGNOSIS — Z87891 Personal history of nicotine dependence: Secondary | ICD-10-CM | POA: Diagnosis not present

## 2018-01-22 DIAGNOSIS — Z7952 Long term (current) use of systemic steroids: Secondary | ICD-10-CM | POA: Insufficient documentation

## 2018-01-22 DIAGNOSIS — Z7982 Long term (current) use of aspirin: Secondary | ICD-10-CM | POA: Diagnosis not present

## 2018-01-22 HISTORY — PX: BOTOX INJECTION: SHX5754

## 2018-01-22 HISTORY — PX: SAVORY DILATION: SHX5439

## 2018-01-22 HISTORY — PX: ESOPHAGOGASTRODUODENOSCOPY (EGD) WITH PROPOFOL: SHX5813

## 2018-01-22 SURGERY — ESOPHAGOGASTRODUODENOSCOPY (EGD) WITH PROPOFOL
Anesthesia: Monitor Anesthesia Care

## 2018-01-22 MED ORDER — SODIUM CHLORIDE 0.9 % IJ SOLN
INTRAMUSCULAR | Status: DC | PRN
  Administered 2018-01-22: 4 mL via SUBMUCOSAL

## 2018-01-22 MED ORDER — LACTATED RINGERS IV SOLN
INTRAVENOUS | Status: DC
  Administered 2018-01-22: 1000 mL via INTRAVENOUS

## 2018-01-22 MED ORDER — ONABOTULINUMTOXINA 100 UNITS IJ SOLR
INTRAMUSCULAR | Status: AC
  Filled 2018-01-22: qty 100

## 2018-01-22 MED ORDER — PROPOFOL 10 MG/ML IV BOLUS
INTRAVENOUS | Status: AC
  Filled 2018-01-22: qty 40

## 2018-01-22 MED ORDER — SODIUM CHLORIDE 0.9 % IV SOLN: INTRAVENOUS | Status: DC

## 2018-01-22 MED ORDER — PROPOFOL 10 MG/ML IV BOLUS
INTRAVENOUS | Status: DC | PRN
  Administered 2018-01-22: 20 mg via INTRAVENOUS

## 2018-01-22 MED ORDER — PROPOFOL 500 MG/50ML IV EMUL
INTRAVENOUS | Status: DC | PRN
  Administered 2018-01-22: 100 ug/kg/min via INTRAVENOUS

## 2018-01-22 MED ORDER — SODIUM CHLORIDE 0.9 % IJ SOLN
INTRAMUSCULAR | Status: AC
  Filled 2018-01-22: qty 10

## 2018-01-22 MED ORDER — LIDOCAINE 2% (20 MG/ML) 5 ML SYRINGE
INTRAMUSCULAR | Status: DC | PRN
  Administered 2018-01-22: 80 mg via INTRAVENOUS

## 2018-01-22 NOTE — Pre-Procedure Instructions (Signed)
Joshua Hudson  01/22/2018      [place], [place] [address] [place] [place] [address] Phone: [phone] Fax: [phone]    Your procedure is scheduled on Feb 01, 2018.  Report to [facility] Admitting at 800 AM.  Call this number if you have problems the morning of surgery:  [phone]   Remember:  No food or drink after midnight.   Take these medicines the morning of surgery with A SIP OF WATER  Metoprolol succinate (toprol XL) Pantoprazole (protonix) Prednisone (deltasone)  Follow your Dr.'s instructions on when to hold/resume aspirin  7 days prior to surgery STOP taking any Aleve, Naproxen, Ibuprofen, Motrin, Advil, Goody's, BC's, all herbal medications, fish oil, and all vitamins   Do not wear jewelry, make-up or nail polish.  Do not wear lotions, powders, or perfumes, or deodorant.  Men may shave face and neck.  Do not bring valuables to the hospital.  [facility] is not responsible for any belongings or valuables.  Contacts, dentures or bridgework may not be worn into surgery.  Leave your suitcase in the car.  After surgery it may be brought to your room.  For patients admitted to the hospital, discharge time will be determined by your treatment team.  Patients discharged the day of surgery will not be allowed to drive home.    [facility]- Preparing For Surgery  Before surgery, you can play an important role. Because skin is not sterile, your skin needs to be as free of germs as possible. You can reduce the number of germs on your skin by washing with CHG (chlorahexidine gluconate) Soap before surgery.  CHG is an antiseptic cleaner which kills germs and bonds with the skin to continue killing germs even after washing.    Oral Hygiene is also important to reduce your risk of infection.  Remember - BRUSH YOUR TEETH THE MORNING OF SURGERY WITH YOUR REGULAR TOOTHPASTE  Please do not use if  you have an allergy to CHG or antibacterial soaps. If your skin becomes reddened/irritated stop using the CHG.  Do not shave (including legs and underarms) for at least 48 hours prior to first CHG shower. It is OK to shave your face.  Please follow these instructions carefully.   1. Shower the NIGHT BEFORE SURGERY and the MORNING OF SURGERY with CHG.   2. If you chose to wash your hair, wash your hair first as usual with your normal shampoo.  3. After you shampoo, rinse your hair and body thoroughly to remove the shampoo.  4. Use CHG as you would any other liquid soap. You can apply CHG directly to the skin and wash gently with a scrungie or a clean washcloth.   5. Apply the CHG Soap to your body ONLY FROM THE NECK DOWN.  Do not use on open wounds or open sores. Avoid contact with your eyes, ears, mouth and genitals (private parts). Wash Face and genitals (private parts)  with your normal soap.  6. Wash thoroughly, paying special attention to the area where your surgery will be performed.  7. Thoroughly rinse your body with warm water from the neck down.  8. DO NOT shower/wash with your normal soap after using and rinsing off the CHG Soap.  9. Pat yourself dry with a CLEAN TOWEL.  10. Wear CLEAN PAJAMAS to bed the night before surgery, wear comfortable clothes the morning of surgery  11. Place CLEAN SHEETS  on your bed the night of your first shower and DO NOT SLEEP WITH PETS.  Day of Surgery:  Do not apply any deodorants/lotions.  Please wear clean clothes to the hospital/surgery center.   Remember to brush your teeth WITH YOUR REGULAR TOOTHPASTE.  Please read over the following fact sheets that you were given.

## 2018-01-22 NOTE — Brief Op Note (Signed)
01/22/2018  1:14 PM  PATIENT:  Joshua Hudson  83 y.o. male  PRE-OPERATIVE DIAGNOSIS:  Esophageal stenosis  POST-OPERATIVE DIAGNOSIS:  Possible achalasia, balloon dilation, botox injection  PROCEDURE:  Procedure(s): ESOPHAGOGASTRODUODENOSCOPY (EGD) WITH PROPOFOL (N/A) BOTOX INJECTION (N/A) SAVORY DILATION (N/A)  SURGEON:  Surgeon(s) and Role:    Ronnette Juniper, MD - Primary  PHYSICIAN ASSISTANT:   ASSISTANTS: Baird Cancer, RN, Marca Ancona, Tech ANESTHESIA:   MAC  EBL:  None  BLOOD ADMINISTERED:none  DRAINS: none   LOCAL MEDICATIONS USED:  NONE  SPECIMEN:  No Specimen  DISPOSITION OF SPECIMEN:  N/A  COUNTS:  YES  TOURNIQUET:  * No tourniquets in log *  DICTATION: .Dragon Dictation  PLAN OF CARE: Discharge from PACU  PATIENT DISPOSITION:  PACU - hemodynamically stable.   Delay start of Pharmacological VTE agent (>24hrs) due to surgical blood loss or risk of bleeding: no

## 2018-01-22 NOTE — Anesthesia Procedure Notes (Signed)
Procedure Name: MAC Date/Time: 01/22/2018 12:48 PM Performed by: Dione Booze, CRNA Pre-anesthesia Checklist: Patient identified, Emergency Drugs available, Suction available and Patient being monitored Patient Re-evaluated:Patient Re-evaluated prior to induction Oxygen Delivery Method: Nasal cannula Placement Confirmation: positive ETCO2

## 2018-01-22 NOTE — Op Note (Signed)
EGD was performed for dysphagia and possible underlying achalasia for treatment with Botox injection.  Findings:  The upper and mid esophagus appeared slightly dilated while significant resistance was met in the distal esophagus during passage of scope into the gastric cavity.  Widely patent Schatzki's ring was noted.  This was dilated with the 20 mm balloon for 2 consecutive minutes however there was normal mucosal trauma noted.   Botox was injected 1 cm above the GE junction, 25 units per mL, 1 in the inferior quadrants and distal esophagus, total 100 units.   The cardia, fundus appeared normal on retroflexion. Rest of the gastric cavity, due to the bulb and duodenum appeared unremarkable.  Recommendations: Regular diet. Repeat endoscopy in a few months depending upon symptoms.  Ronnette Juniper, MD

## 2018-01-22 NOTE — Discharge Instructions (Signed)
Esophagogastroduodenoscopy, Care After °Refer to this sheet in the next few weeks. These instructions provide you with information about caring for yourself after your procedure. Your health care provider may also give you more specific instructions. Your treatment has been planned according to current medical practices, but problems sometimes occur. Call your health care provider if you have any problems or questions after your procedure. °What can I expect after the procedure? °After the procedure, it is common to have: °· A sore throat. °· Nausea. °· Bloating. °· Dizziness. °· Fatigue. ° °Follow these instructions at home: °· Do not eat or drink anything until the numbing medicine (local anesthetic) has worn off and your gag reflex has returned. You will know that the local anesthetic has worn off when you can swallow comfortably. °· Do not drive for 24 hours if you received a medicine to help you relax (sedative). °· If your health care provider took a tissue sample for testing during the procedure, make sure to get your test results. This is your responsibility. Ask your health care provider or the department performing the test when your results will be ready. °· Keep all follow-up visits as told by your health care provider. This is important. °Contact a health care provider if: °· You cannot stop coughing. °· You are not urinating. °· You are urinating less than usual. °Get help right away if: °· You have trouble swallowing. °· You cannot eat or drink. °· You have throat or chest pain that gets worse. °· You are dizzy or light-headed. °· You faint. °· You have nausea or vomiting. °· You have chills. °· You have a fever. °· You have severe abdominal pain. °· You have black, tarry, or bloody stools. °This information is not intended to replace advice given to you by your health care provider. Make sure you discuss any questions you have with your health care provider. °Document Released: 08/07/2012 Document  Revised: 01/27/2016 Document Reviewed: 07/15/2015 °Elsevier Interactive Patient Education © 2018 [employer] ° °

## 2018-01-22 NOTE — Anesthesia Postprocedure Evaluation (Signed)
Anesthesia Post Note  Patient: Joshua Hudson  Procedure(s) Performed: ESOPHAGOGASTRODUODENOSCOPY (EGD) WITH PROPOFOL (N/A ) BOTOX INJECTION (N/A ) SAVORY DILATION (N/A )     Patient location during evaluation: Endoscopy Anesthesia Type: MAC Level of consciousness: awake and alert Pain management: pain level controlled Vital Signs Assessment: post-procedure vital signs reviewed and stable Respiratory status: spontaneous breathing, nonlabored ventilation, respiratory function stable and patient connected to nasal cannula oxygen Cardiovascular status: stable and blood pressure returned to baseline Postop Assessment: no apparent nausea or vomiting Anesthetic complications: no    Last Vitals:  Vitals:   01/22/18 1126 01/22/18 1310  BP: 125/71 (!) 97/52  Pulse: 67 66  Resp: 13 (!) 25  Temp: 36.7 C 36.9 C  SpO2: 100% 100%    Last Pain:  Vitals:   01/22/18 1310  TempSrc: Oral  PainSc: 0-No pain                 Montez Hageman

## 2018-01-22 NOTE — Anesthesia Preprocedure Evaluation (Signed)
Anesthesia Evaluation  Patient identified by MRN, date of birth, ID band Patient awake    Reviewed: Allergy & Precautions, NPO status , Patient's Chart, lab work & pertinent test results  Airway Mallampati: II  TM Distance: >3 FB Neck ROM: Full    Dental no notable dental hx.    Pulmonary neg pulmonary ROS, former smoker,    Pulmonary exam normal breath sounds clear to auscultation       Cardiovascular Normal cardiovascular exam+ dysrhythmias Atrial Fibrillation  Rhythm:Regular Rate:Normal  S/p AVR 2009   Neuro/Psych negative neurological ROS  negative psych ROS   GI/Hepatic negative GI ROS, Neg liver ROS,   Endo/Other  negative endocrine ROS  Renal/GU negative Renal ROS  negative genitourinary   Musculoskeletal  (+) Arthritis , Rheumatoid disorders and steroids,    Abdominal   Peds negative pediatric ROS (+)  Hematology negative hematology ROS (+)   Anesthesia Other Findings   Reproductive/Obstetrics negative OB ROS                             Anesthesia Physical Anesthesia Plan  ASA: III  Anesthesia Plan: MAC   Post-op Pain Management:    Induction:   PONV Risk Score and Plan: 1 and Ondansetron and Treatment may vary due to age or medical condition  Airway Management Planned: Simple Face Mask  Additional Equipment:   Intra-op Plan:   Post-operative Plan:   Informed Consent: I have reviewed the patients History and Physical, chart, labs and discussed the procedure including the risks, benefits and alternatives for the proposed anesthesia with the patient or authorized representative who has indicated his/her understanding and acceptance.   Dental advisory given  Plan Discussed with: CRNA  Anesthesia Plan Comments:         Anesthesia Quick Evaluation

## 2018-01-22 NOTE — Transfer of Care (Signed)
Immediate Anesthesia Transfer of Care Note  Patient: Joshua Hudson  Procedure(s) Performed: ESOPHAGOGASTRODUODENOSCOPY (EGD) WITH PROPOFOL (N/A ) BOTOX INJECTION (N/A ) SAVORY DILATION (N/A )  Patient Location: PACU and Endoscopy Unit  Anesthesia Type:MAC  Level of Consciousness: awake, alert , oriented and patient cooperative  Airway & Oxygen Therapy: Patient Spontanous Breathing and Patient connected to nasal cannula oxygen  Post-op Assessment: Report given to RN and Post -op Vital signs reviewed and stable  Post vital signs: Reviewed and stable  Last Vitals:  Vitals Value Taken Time  BP    Temp    Pulse    Resp    SpO2      Last Pain:  Vitals:   01/22/18 1126  TempSrc: Oral  PainSc: 0-No pain         Complications: No apparent anesthesia complications

## 2018-01-22 NOTE — H&P (Signed)
General:  83/male was seen for dysphagia.He underwent an EGD on 10/25/17 which showed normal esophageal biopsies , no EOE, no celiac and no H pylori, but the lumen of upper esophagus was dilated and benign appearing intrinsic stenosis was noted at 40 cm, dilated with a 20 mm balloon, and 1 superficial DU. He had an esophageal manometry on 11/07/17 which showed elevated LES resting and relaxation pressure, one failed persistalsis wtih panesophageal pressurization, but not diagnostic of achalasia due to intact peristalsis on majority of swallows. EGJ outflow obstructin noted and botox injection to LES was recommended. He reports improvement in his symptoms by 80 percent. He has gained about 3 pounds since his dilatation. He is still careful about eating certain solid food such as chicken and the majority of his diet is soft or liquid consistency.   Current Medications  Taking   Multivitamins Tablet 1 tablet Orally once a day   Folic Acid 1 Tablet TAKE ONE TABLET BY MOUTH EVERY DAY   Aspirin EC 81 MG Tablet Delayed Release 1 tablet Orally daily   Amoxicillin 500MG  Tablet 4 tablet Orally one dose 30 minutes before dental procedure   Iron (Ferrous Gluconate) 256 (28 Fe) MG Tablet 1 tablet Orally Once a day   Leflunomide 20 MG Tablet 1 tablet Orally Once a day   Methotrexate 2.5MG  Tablet TAKE TEN TABLETS BY MOUTH ONCE A WEEK   PredniSONE 5MG  Tablet TAKE ONE TO TWO TABLETS BY MOUTH EVERY DAY AS DIRECTED   Metoprolol Succinate 25MG  tablet TAKE ONE TABLET BY MOUTH EVERY DAY   Pantoprazole Sodium 40 MG Tablet Delayed Release 1 tablet Orally Once a day   Discontinued   Sildenafil Citrate 20 MG Tablet 2-5 Orally once a day as needed   Valtrex(ValACYclovir HCl) 500 MG Tablet 2 tablets Orally every 12 hrs   Medication List reviewed and reconciled with the patient    Past Medical History  severe aortic stenosis status post prosthetic valve replacement with 25 mm Edwards like science pericardial tissue valve,  [facility].   rheumatoid arthritis-s/p long term steroids, PLQ--d/c ? eye toxicity, MTX, [place].   atrial fibrillation postoperatively without recurrence, [facility].   Degenerative joint disease of the lumbosacral spine.   Diverticulosis.   Occipital neuralgia.   Colon polyps, last colonoscopy in 2004.   Status post diverticular perforation requiring 2-stage repair.   SBE prophylaxis dental procedures.   Erectile dysfunction.   Herpes keratitis os 12/15 [facility].   esophageal stricture, EGD 2015.   bronchiectasis on CT scan, asymptomatic, 2015.   right cervical radiculopathy, December 2015.   cecal AVMs.   Osteopenia.   Edyth Gunnels, cardiology [place], rheumatology [place], [facility], pulm clance(released), ennever oncology.    Surgical History  C3-C4 cervical fusion 1995  appendectomy   tonsillectomy   TURP 1991  cystoscopy with laser incision of the bladder neck 1992  kidney stone removal 1997  surgery on cervical spine and right a.c. joint   temporary colostomy following colonic perforation from diverticular perforation with re-anastomosis 2002  hernia repair, [place] 2005  aortic valve replacement with prosthetic aortic valve (Edwards like science pericardial tissue valve) 5/09  colonoscopy/endoscopy 2015  endoscopy 10/25/17   Family History  Father: deceased 65 yrs, MI, rheumatoid arthritis  Mother: deceased 70 yrs, Dementia  Brother 1: deceased, suicide  Brother2: alive  Brother 3: alive  Sister 1: deceased, Status post cardiac transplant from automobile accident  Sister 2: deceased, diagnosed with Diabetes  Sister 3: alive  Daughter(s): Rheumatoid arthritis  3 brother(s) , 5 sister(s) . 2daughter(s) - healthy.   No Family History of Colon Cancer, Polyps, or Liver Disease.   Social History  General:  Tobacco use  cigarettes: Former smoker Quit in year 1995 smoked a couple cigs/day for 8-10 years Pack-year Hx: 2 Tobacco history last updated  11/20/2017 Additional Findings: Tobacco Non-User Ex-light cigarette smoker (1-9/day) Alcohol: Rare.  Exercise: walk almost daily 1 or more miles.  Marital Status: Married.  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    Allergies  N.K.D.A.   Hospitalization/Major Diagnostic Procedure  Not in the past year 11/2017    Review of Systems  GI PROCEDURE:  no Pacemaker/ AICD. Artificial heart valves YES, YES, aortic valve. no MI/heart attack. Abnormal heart rhythm YES, Atrial Fibrillation. no Angina. no CVA. no Hypertension. no Hypotension. no Asthma, COPD. no Sleep apnea. no Seizure disorders. no Artificial joints. Severe DJD YES, Rheumatoid Athritis, Osteopenia. no Diabetes. no Significant headaches. no Vertigo. no Depression/anxiety. no Abnormal bleeding. no Kidney Disease, Kidney stones. no Liver disease. Blood transfusion yes.      Vital Signs  Wt 107.1, Wt change 2.1 lb, Ht 61.5, BMI 19.91, Temp 97.7, Pulse sitting 71, BP sitting 116/63.   Examination  Gastroenterology:: GENERAL APPEARANCE: Well developed, very thinly built, no active distress, pleasant, no acute distress.  SCLERA: anicteric.  EXTREMITIES: No edema, pulses intact.  NEURO: normal strength and reflexes, cranial nerves II-XII grossly intact, normal gait.  PSYCH: mood/affect normal.     Assessments   1. Esophageal stenosis - K22.2 (Primary)   Treatment  1. Esophageal stenosis  IMAGING: Esophagoscopy    Whitfield,Dia 11/20/2017 08:46:11 AM > spoke with [place]-scheduled for 01/22/18-[facility]-prep instructions given to pt.   Notes: Discussed about repeating EGD with balloon dilatation versus doing EGD with Botox injection on a regular schedule basis. After detailed discussion patient would like to be scheduled for an EGD with botox injection at the end of 5/19. The risk and benefits of the procedure we discussed with the patient in details. Understands and verbalizes consent.    Ronnette Juniper,  MD

## 2018-01-22 NOTE — Op Note (Signed)
[facility] Patient Name: Joshua Hudson Procedure Date: 01/22/2018 MRN: [id number] Attending MD: Ronnette Juniper , MD Date of Birth: 10/21/1934 CSN: [id number] Age: 83 Admit Type: Inpatient Procedure:                Upper GI endoscopy Indications:              Dysphagia, For therapy of esophageal stenosis Providers:                Ronnette Juniper, MD, Baird Cancer, RN, Alan Mulder,                            Technician, Dione Booze, CRNA Referring MD:              Medicines:                Monitored Anesthesia Care Complications:            No immediate complications. Estimated Blood Loss:     Estimated blood loss: none. Procedure:                Pre-Anesthesia Assessment:                           - Prior to the procedure, a History and Physical                            was performed, and patient medications and                            allergies were reviewed. The patient's tolerance of                            previous anesthesia was also reviewed. The risks                            and benefits of the procedure and the sedation                            options and risks were discussed with the patient.                            All questions were answered, and informed consent                            was obtained. Prior Anticoagulants: The patient has                            taken aspirin, last dose was 7 days prior to                            procedure. ASA Grade Assessment: III - A patient                            with severe systemic disease. After reviewing the  risks and benefits, the patient was deemed in                            satisfactory condition to undergo the procedure.                           After obtaining informed consent, the endoscope was                            passed under direct vision. Throughout the                            procedure, the patient's blood pressure, pulse, and                 oxygen saturations were monitored continuously. The                            [id number]) scope was introduced through the                            mouth, and advanced to the second part of duodenum.                            The upper GI endoscopy was accomplished without                            difficulty. The patient tolerated the procedure                            well. Scope In: Scope Out: Findings:      One benign-appearing, intrinsic moderate (circumferential scarring or       stenosis; an endoscope may pass) stenosis was found 35 to 40 cm from the       incisors.The upper and mid esophagus appeared slightly dilated, there       was resistance to passage of scope in the distal esophagus. The stenosis       was traversed. There was a widely patent ring noted at GE junction. A       TTS dilator was passed through the scope. Dilation with an 18-19-20 mm x       8 cm CRE balloon dilator was performed to 20 mm for 2 consecutive       minutes. The dilation site was examined following endoscope reinsertion       and showed no change. Area was successfully injected with 100 units       botulinum toxin, 25 units/ml, 1 ml in four quadrants in the distal       esophagus,1 cm above the GE junction.      The entire examined stomach was normal.      The cardia and gastric fundus were normal on retroflexion.      The examined duodenum was normal. Impression:               - Benign-appearing esophageal stenosis. Dilated.                            Injected  with botulinum toxin.                           - Normal stomach.                           - Normal examined duodenum.                           - No specimens collected. Moderate Sedation:      Patient did not receive moderate sedation for this procedure, but       instead received monitored anesthesia care. Recommendation:           - Patient has a contact number available for                             emergencies. The signs and symptoms of potential                            delayed complications were discussed with the                            patient. Return to normal activities tomorrow.                            Written discharge instructions were provided to the                            patient.                           - Resume regular diet.                           - Continue present medications.                           - Repeat upper endoscopy at appointment to be                            scheduled for retreatment. Procedure Code(s):        --- Professional ---                           [id number], Esophagogastroduodenoscopy, flexible,                            transoral; with transendoscopic balloon dilation of                            esophagus (less than 30 mm diameter)                           43236, 59, Esophagogastroduodenoscopy, flexible,                            transoral; with directed submucosal injection(s),  any substance Diagnosis Code(s):        --- Professional ---                           K22.2, Esophageal obstruction                           R13.10, Dysphagia, unspecified CPT copyright 2017 American Medical Association. All rights reserved. The codes documented in this report are preliminary and upon coder review may  be revised to meet current compliance requirements. Ronnette Juniper, MD 01/22/2018 1:09:36 PM This report has been signed electronically. Number of Addenda: 0

## 2018-01-23 ENCOUNTER — Encounter (HOSPITAL_COMMUNITY)
Admission: RE | Admit: 2018-01-23 | Discharge: 2018-01-23 | Disposition: A | Discharge status: Home / Self Care | Payer: Medicare Other | Source: Ambulatory Visit | Attending: Orthopedic Surgery | Admitting: Orthopedic Surgery

## 2018-01-23 ENCOUNTER — Encounter (HOSPITAL_COMMUNITY): Payer: Self-pay

## 2018-01-23 ENCOUNTER — Other Ambulatory Visit: Payer: Self-pay

## 2018-01-23 DIAGNOSIS — M069 Rheumatoid arthritis, unspecified: Secondary | ICD-10-CM | POA: Insufficient documentation

## 2018-01-23 DIAGNOSIS — Z01812 Encounter for preprocedural laboratory examination: Secondary | ICD-10-CM | POA: Diagnosis present

## 2018-01-23 DIAGNOSIS — Z961 Presence of intraocular lens: Secondary | ICD-10-CM | POA: Diagnosis not present

## 2018-01-23 DIAGNOSIS — Z79899 Other long term (current) drug therapy: Secondary | ICD-10-CM | POA: Insufficient documentation

## 2018-01-23 DIAGNOSIS — Z86010 Personal history of colonic polyps: Secondary | ICD-10-CM | POA: Diagnosis not present

## 2018-01-23 DIAGNOSIS — Z9841 Cataract extraction status, right eye: Secondary | ICD-10-CM | POA: Insufficient documentation

## 2018-01-23 DIAGNOSIS — I2510 Atherosclerotic heart disease of native coronary artery without angina pectoris: Secondary | ICD-10-CM | POA: Diagnosis not present

## 2018-01-23 DIAGNOSIS — Z7952 Long term (current) use of systemic steroids: Secondary | ICD-10-CM | POA: Insufficient documentation

## 2018-01-23 DIAGNOSIS — Z951 Presence of aortocoronary bypass graft: Secondary | ICD-10-CM | POA: Insufficient documentation

## 2018-01-23 DIAGNOSIS — Z9842 Cataract extraction status, left eye: Secondary | ICD-10-CM | POA: Diagnosis not present

## 2018-01-23 DIAGNOSIS — Z7982 Long term (current) use of aspirin: Secondary | ICD-10-CM | POA: Insufficient documentation

## 2018-01-23 DIAGNOSIS — Z87442 Personal history of urinary calculi: Secondary | ICD-10-CM | POA: Insufficient documentation

## 2018-01-23 DIAGNOSIS — M75101 Unspecified rotator cuff tear or rupture of right shoulder, not specified as traumatic: Secondary | ICD-10-CM | POA: Diagnosis not present

## 2018-01-23 DIAGNOSIS — Z01818 Encounter for other preprocedural examination: Secondary | ICD-10-CM | POA: Diagnosis not present

## 2018-01-23 DIAGNOSIS — K219 Gastro-esophageal reflux disease without esophagitis: Secondary | ICD-10-CM | POA: Diagnosis not present

## 2018-01-23 DIAGNOSIS — Z952 Presence of prosthetic heart valve: Secondary | ICD-10-CM | POA: Insufficient documentation

## 2018-01-23 DIAGNOSIS — M19011 Primary osteoarthritis, right shoulder: Secondary | ICD-10-CM | POA: Insufficient documentation

## 2018-01-23 HISTORY — DX: Gastro-esophageal reflux disease without esophagitis: K21.9

## 2018-01-23 HISTORY — DX: Personal history of urinary calculi: Z87.442

## 2018-01-23 LAB — BASIC METABOLIC PANEL
ANION GAP: 8 (ref 5–15)
BUN: 17 mg/dL (ref 6–20)
CALCIUM: 8.9 mg/dL (ref 8.9–10.3)
CO2: 26 mmol/L (ref 22–32)
CREATININE: 0.91 mg/dL (ref 0.61–1.24)
Chloride: 105 mmol/L (ref 101–111)
GFR calc non Af Amer: >60 mL/min (ref >60)
Glucose, Bld: 99 mg/dL (ref 65–99)
Potassium: 4.1 mmol/L (ref 3.5–5.1)
SODIUM: 139 mmol/L (ref 135–145)

## 2018-01-23 LAB — CBC
HCT: 27.8 % — ABNORMAL LOW (ref 39.0–52.0)
Hemoglobin: 8.9 g/dL — ABNORMAL LOW (ref 13.0–17.0)
MCH: 21.6 pg — ABNORMAL LOW (ref 26.0–34.0)
MCHC: 32 g/dL (ref 30.0–36.0)
MCV: 67.5 fL — ABNORMAL LOW (ref 78.0–100.0)
Platelets: 125 10*3/uL — ABNORMAL LOW (ref 150–400)
RBC: 4.12 MIL/uL — ABNORMAL LOW (ref 4.22–5.81)
RDW: 16.8 % — AB (ref 11.5–15.5)
WBC: 6.9 10*3/uL (ref 4.0–10.5)

## 2018-01-23 LAB — SURGICAL PCR SCREEN
MRSA, PCR: NEGATIVE
STAPHYLOCOCCUS AUREUS: NEGATIVE

## 2018-01-23 NOTE — H&P (Signed)
Joshua Hudson is an 83 y.o. male.    Chief Complaint: right shoulder pain  HPI: Pt is a 83 y.o. male complaining of right shoulder pain for multiple years. Pain had continually increased since the beginning. X-rays in the clinic show end-stage arthritic changes of the right shoulder. Pt has tried various conservative treatments which have failed to alleviate their symptoms, including injections and therapy. Various options are discussed with the patient. Risks, benefits and expectations were discussed with the patient. Patient understand the risks, benefits and expectations and wishes to proceed with surgery.   PCP:  Lavone Orn, MD  D/C Plans: Home  PMH: Past Medical History:  Diagnosis Date  . Colon polyps    s/p diverticular perforation requiring 2-stage repair  . Coronary artery disease    nonobstructive of all 3 vessels by coronary CTA  . Diverticulosis   . DJD (degenerative joint disease), lumbosacral   . GERD (gastroesophageal reflux disease)   . History of kidney stones   . Mitral regurgitation 05/16/2016   mild by echo 05/2016  . Occipital neuralgia   . Postoperative atrial fibrillation ([place]) 05/10/2015  . Rheumatoid arthritis ([place])    s/o long term steroids  . SBE (subacute bacterial endocarditis) prophylaxis candidate    for dental procedures  . Severe aortic stenosis    S/P prosthetic valve replacement w 25 mm Edwards like science percardial tissue valve,[facility]    PSH: Past Surgical History:  Procedure Laterality Date  . APPENDECTOMY    . CARDIAC CATHETERIZATION     09  . CARDIAC VALVE REPLACEMENT  01/2008   aortic valve replacement  . CATARACT EXTRACTION W/ INTRAOCULAR LENS  IMPLANT, BILATERAL    . COLON RESECTION     diverticulitis   . ESOPHAGEAL MANOMETRY N/A 11/07/2017   Procedure: ESOPHAGEAL MANOMETRY (EM);  Surgeon: Ronnette Juniper, MD;  Location: [facility] ENDOSCOPY;  Service: Gastroenterology;  Laterality: N/A;  . HERNIA REPAIR    . LUMBAR LAMINECTOMY     x  2  . TEE WITHOUT CARDIOVERSION N/A 01/29/2014   Procedure: TRANSESOPHAGEAL ECHOCARDIOGRAM (TEE);  Surgeon: Sueanne Margarita, MD;  Location: [facility] ENDOSCOPY;  Service: Cardiovascular;  Laterality: N/A;    Social History:  reports that he quit smoking about 34 years ago. His smoking use included cigarettes. He started smoking about 44 years ago. He has a 5.00 pack-year smoking history. He has never used smokeless tobacco. He reports that he drinks alcohol. He reports that he does not use drugs.  Allergies:  No Known Allergies  Medications: No current facility-administered medications for this encounter.    Current Outpatient Medications  Medication Sig Dispense Refill  . aspirin EC 81 MG EC tablet Take 1 tablet (81 mg total) by mouth daily. 30 tablet   . Cholecalciferol (VITAMIN D3) 1000 units CAPS Take 1,000 Units by mouth daily.     . folic acid (FOLVITE) 1 MG tablet Take 1 mg by mouth daily.    Marland Kitchen KRILL OIL PO Take 750 mg by mouth daily.    Marland Kitchen leflunomide (ARAVA) 20 MG tablet Take 20 mg by mouth daily.    . methotrexate (RHEUMATREX) 2.5 MG tablet Take 25 mg by mouth every Sunday. Caution:Chemotherapy. Protect from light.     . metoprolol succinate (TOPROL-XL) 25 MG 24 hr tablet TAKE ONE TABLET BY MOUTH ONCE DAILY 90 tablet 3  . Multiple Vitamin (MULTIVITAMIN) tablet Take 1 tablet by mouth daily.    . Naphazoline HCl (CLEAR EYES OP) Place 1 drop  into both eyes 3 (three) times daily as needed (for dry eyes).    . pantoprazole (PROTONIX) 40 MG tablet Take 40 mg by mouth daily.     . predniSONE (DELTASONE) 5 MG tablet Take 5 mg by mouth daily with breakfast.     . Turmeric 500 MG TABS Take 500 mg by mouth daily.    . vitamin B-12 (CYANOCOBALAMIN) 1000 MCG tablet Take 1,000 mcg by mouth daily.      No results found for this or any previous visit (from the past 48 hour(s)). No results found.  ROS: Pain with rom of the right upper extremity  Physical Exam:  Alert and oriented 83 y.o. male in  no acute distress Cranial nerves 2-12 intact Cervical spine: full rom with no tenderness, nv intact distally Chest: active breath sounds bilaterally, no wheeze rhonchi or rales Heart: regular rate and rhythm, no murmur Abd: non tender non distended with active bowel sounds Hip is stable with rom  Right shoulder moderate limitation with rom and strength nv intact distally No rashes or edema  Assessment/Plan Assessment: right shoulder cuff arthropathy  Plan: Patient will undergo a right reverse total shoulder by Dr. Veverly Fells at [facility]. Risks benefits and expectations were discussed with the patient. Patient understand risks, benefits and expectations and wishes to proceed.  Merla Riches PA-C, MPAS [facility] is now [employer] [address], [place], [place], [place] [address] Phone: [phone] [email or web address] Facebook  [employer]

## 2018-01-23 NOTE — Progress Notes (Signed)
Patient stated he was instructed to stop aspirin , vitamins, fish oil prior to surgery and last took them 01/18/18.

## 2018-01-24 ENCOUNTER — Telehealth: Payer: Self-pay

## 2018-01-24 ENCOUNTER — Encounter (HOSPITAL_COMMUNITY): Payer: Self-pay | Admitting: Emergency Medicine

## 2018-01-24 NOTE — Telephone Encounter (Signed)
   [facility] Pre-operative Risk Assessment    Request for surgical clearance:  1. What type of surgery is being performed? Right Shoulder reverse TSA   2. When is this surgery scheduled? 02/01/18   3. What type of clearance is required (medical clearance vs. Pharmacy clearance to hold med vs. Both)? Pharmacy  4. Are there any medications that need to be held prior to surgery and how long?aspirin   5. Practice name and name of physician performing surgery? [facility]/    6. What is your office phone [phone]    7.   What is your office fax [phone]  8.   Anesthesia type (None, local, MAC, general) ? MAC   Frederik Schmidt 01/24/2018, 2:05 PM  _________________________________________________________________   (provider comments below)

## 2018-01-24 NOTE — Progress Notes (Signed)
Anesthesia Chart Review:   Case:  [id number] Date/Time:  02/01/18 0845   Procedure:  RIGHT REVERSE SHOULDER ARTHROPLASTY (Right Shoulder)   Anesthesia type:  Choice   Pre-op diagnosis:  Right shoulder osteoarthritis, rotator cuff insufficiency   Location:  [facility] OR ROOM 04 / [place] [facility]   Surgeon:  Netta Cedars, MD      DISCUSSION: - Pt is an 83 year old male with hx severe aortic stenosis (s/p prosthetic valve replacement 2009), post-op afib (no recurrence)  - Hgb 8.9 at  pre-admission testing; prior most recent CBC in 2016 showed hgb 11.7.   - Reviewed labs with Dr. Linna Caprice.  Will get T&S for day of surgery.   - I left voicemail for Margarita Grizzle in Dr. Veverly Fells' office about low H/H   VS: BP 106/60   Pulse 61   Temp 36.7 C   Resp 18   Ht 5\' 3"  (1.6 m)   Wt 101 lb 3.2 oz (45.9 kg)   SpO2 100%   BMI 17.93 kg/m     PROVIDERS: PCP is Lavone Orn, MD who cleared pt for surgery  Patient Care Team: Sueanne Margarita, MD as Consulting Physician (Cardiology). Last office visit 05/21/17.    LABS:  - H/H 8.9/27.8 - Baseline hgb 10.5-11.7, but no CBC since 2016.     (all labs ordered are listed, but only abnormal results are displayed)  Labs Reviewed  CBC - Abnormal; Notable for the following components:      Result Value   RBC 4.12 (*)    Hemoglobin 8.9 (*)    HCT 27.8 (*)    MCV 67.5 (*)    MCH 21.6 (*)    RDW 16.8 (*)    Platelets 125 (*)    All other components within normal limits  SURGICAL PCR SCREEN  BASIC METABOLIC PANEL    EKG 02/22/29: sinus bradycardia (59 bpm)   CV:  Echo 06/01/16:  - Status, risk factors: Aortic Valve Replacement (25 mm Edwards tissue valve, 2009) - Left ventricle: The cavity size was normal. Wall thickness was normal. Systolic function was normal. The estimated ejection fraction was in the range of 60% to 65%. Wall motion was normal; there were no regional wall motion abnormalities. The study is not technically sufficient to allow evaluation of LV  diastolic function. - Aortic valve: A bioprosthesis was present. Functioning normally.   Mild diffuse thickening. There was trivial regurgitation. - Mitral valve: Calcified annulus. Mildly thickened leaflets. There was mild regurgitation. - Left atrium: The atrium was mildly dilated. - Right atrium: The atrium was mildly dilated. - Impressions: Aortic regurgitation appears improved when compared to prior study.  CT coronary morphology 02/16/14:  1) No perivalvular aortic abscess. The tissue AVR is well seated with intact sewing ring. I reviewed the patients TTE/TEE from 5/19 and 01/29/14 The suspicious area was from off axis imaging of the valve and seeing the dilated non coronary sinus at the level of the AVR. 2) Normal aortic root 3) Calcium score 926 81st percentile for age and sex matched disease 4) Non obstructive calcific disease in all 3 major coronary arteries  Cardiac cath 01/13/08:   1. Nonobstructive coronary disease (OM2 20-30%).  2. Severe aortic stenosis by echocardiogram.  3. Normal left ventricular function by echocardiogram.  4. Normal right heart pressures.  Past Medical History:  Diagnosis Date  . Colon polyps    s/p diverticular perforation requiring 2-stage repair  . Coronary artery disease    nonobstructive of all  3 vessels by coronary CTA  . Diverticulosis   . DJD (degenerative joint disease), lumbosacral   . GERD (gastroesophageal reflux disease)   . History of kidney stones   . Mitral regurgitation 05/16/2016   mild by echo 05/2016  . Occipital neuralgia   . Postoperative atrial fibrillation ([place]) 05/10/2015  . Rheumatoid arthritis ([place])    s/o long term steroids  . SBE (subacute bacterial endocarditis) prophylaxis candidate    for dental procedures  . Severe aortic stenosis    S/P prosthetic valve replacement w 25 mm Edwards like science percardial tissue valve,Turner    Past Surgical History:  Procedure Laterality Date  . APPENDECTOMY    . BOTOX  INJECTION N/A 01/22/2018   Procedure: BOTOX INJECTION;  Surgeon: Ronnette Juniper, MD;  Location: [facility] ENDOSCOPY;  Service: Gastroenterology;  Laterality: N/A;  . CARDIAC CATHETERIZATION     09  . CARDIAC VALVE REPLACEMENT  01/2008   aortic valve replacement  . CATARACT EXTRACTION W/ INTRAOCULAR LENS  IMPLANT, BILATERAL    . COLON RESECTION     diverticulitis   . ESOPHAGEAL MANOMETRY N/A 11/07/2017   Procedure: ESOPHAGEAL MANOMETRY (EM);  Surgeon: Ronnette Juniper, MD;  Location: [facility] ENDOSCOPY;  Service: Gastroenterology;  Laterality: N/A;  . ESOPHAGOGASTRODUODENOSCOPY (EGD) WITH PROPOFOL N/A 01/22/2018   Procedure: ESOPHAGOGASTRODUODENOSCOPY (EGD) WITH PROPOFOL;  Surgeon: Ronnette Juniper, MD;  Location: [facility] ENDOSCOPY;  Service: Gastroenterology;  Laterality: N/A;  . HERNIA REPAIR    . LUMBAR LAMINECTOMY     x 2  . SAVORY DILATION N/A 01/22/2018   Procedure: SAVORY DILATION;  Surgeon: Ronnette Juniper, MD;  Location: [facility] ENDOSCOPY;  Service: Gastroenterology;  Laterality: N/A;  . TEE WITHOUT CARDIOVERSION N/A 01/29/2014   Procedure: TRANSESOPHAGEAL ECHOCARDIOGRAM (TEE);  Surgeon: Sueanne Margarita, MD;  Location: [facility] ENDOSCOPY;  Service: Cardiovascular;  Laterality: N/A;    MEDICATIONS: . aspirin EC 81 MG EC tablet  . Cholecalciferol (VITAMIN D3) 1000 units CAPS  . folic acid (FOLVITE) 1 MG tablet  . KRILL OIL PO  . leflunomide (ARAVA) 20 MG tablet  . methotrexate (RHEUMATREX) 2.5 MG tablet  . metoprolol succinate (TOPROL-XL) 25 MG 24 hr tablet  . Multiple Vitamin (MULTIVITAMIN) tablet  . Naphazoline HCl (CLEAR EYES OP)  . pantoprazole (PROTONIX) 40 MG tablet  . predniSONE (DELTASONE) 5 MG tablet  . Turmeric 500 MG TABS  . vitamin B-12 (CYANOCOBALAMIN) 1000 MCG tablet   No current facility-administered medications for this encounter.     If no changes, I anticipate pt can proceed with surgery as scheduled.   Willeen Cass, FNP-BC [facility]/Anesthesiology Phone: [phone] 01/25/2018  11:26 AM

## 2018-01-25 NOTE — Telephone Encounter (Signed)
SPOKE TO PT AND PT AGREED TO APPT WITH DANA DUNN ON 01-29-18 FOR PRE OPERATIVE AT 1:30 PM

## 2018-01-25 NOTE — Telephone Encounter (Signed)
Pt has not been seen in 9 months and with AVR and pt's age he needs appt prior to surgery.  Needs to be Tuesday the 28th or 29th. Please arrange.

## 2018-01-28 ENCOUNTER — Encounter: Payer: Self-pay | Admitting: Physician Assistant

## 2018-01-28 NOTE — Progress Notes (Addendum)
Cardiology Office Note    Date:  01/29/2018  ID:  Blong, Busk Mar 08, 1935, MRN [id number] PCP:  Lavone Orn, MD  Cardiologist:  [name], MD   Chief Complaint: pre-operative evaluation  History of Present Illness:  Joshua Hudson is a 83 y.o. male with history of nonobstructive CAD, severe aortic stenosis s/p bioprosthetic AVR and post-op atrial fib, diverticulosis, DJD, mild mitral regurgitation, RA, esophageal stricture s/p dilations with long term steroid use who presents for pre-op eval for shoulder surgery.  Prior cath 01/2008 showed 20-30% OM2, otherwise normal except severe AS which prompted bioprosthetic AVR at that time. He had post-op atrial fib with chemical conversion to NSR with amiodarone. TEE 01/2014 showed normal LV/RV, mildly dilated LA, prosthetic AVR well seated with no rocking motions, there was a lucent area around outside perimeter concerning for abscess vs post-op changes. This was evaluated with cardiac CT 02/2014 showing no evidence of abscess, normal aortic root, nonobstructive disease in 3V. 2D echo 05/2016 showed EF 60-65%, AVR functioning normally, mild MR, mild LAE/RAE.  He presents back for surgical clearance evaluation. He has had right shoulder problems for many years and is looking forward to having this addressed. He denies any cardiac complaints including CP, SOB, diaphoresis, palpitations, near syncope, edema or syncope. He continues to periodically push-mow and weed-eat without any cardiac symptoms or complaints. He did recently sustain a 20lb weight loss in the setting of recurrent esophageal stricture eventually requiring re-dilatation and Botox injections. He's since been able to begin eating again and has gained 4lb back. He was unaware that his hemoglobin was so low. At some point he remembers being told to take iron by rheumatology but is no longer taking this. We called his PCP's office and the last Hgb they had on file was from 2014 in the 11  range. We called rheumatology and their office indicates Hgb was 8.9 in 11/2017 at which time he was advised to f/u PCP for potential blood loss evaluation. The patient denies any BRBPR, melena, hematemesis, hematuria or any other known sources of bleeding.     Past Medical History:  Diagnosis Date  . Colon polyps    s/p diverticular perforation requiring 2-stage repair  . Coronary artery disease    a. 20-30% OM2 by cath 2009. b. nonobstructive of all 3 vessels by coronary CTA 2015.  . Diverticulosis   . DJD (degenerative joint disease), lumbosacral   . GERD (gastroesophageal reflux disease)   . History of kidney stones   . Mitral regurgitation 05/16/2016   mild by echo 05/2016  . Occipital neuralgia   . Postoperative atrial fibrillation ([place]) 05/10/2015  . Rheumatoid arthritis ([place])    s/o long term steroids  . S/P aortic valve replacement with tissue   . SBE (subacute bacterial endocarditis) prophylaxis candidate    for dental procedures  . Severe aortic stenosis    S/P prosthetic valve replacement w 25 mm Edwards like science percardial tissue valve,Turner - 01/2008    Past Surgical History:  Procedure Laterality Date  . APPENDECTOMY    . BOTOX INJECTION N/A 01/22/2018   Procedure: BOTOX INJECTION;  Surgeon: Ronnette Juniper, MD;  Location: [facility];  Service: Gastroenterology;  Laterality: N/A;  . CARDIAC CATHETERIZATION     09  . CARDIAC VALVE REPLACEMENT  01/2008   aortic valve replacement  . CATARACT EXTRACTION W/ INTRAOCULAR LENS  IMPLANT, BILATERAL    . COLON RESECTION     diverticulitis   . ESOPHAGEAL MANOMETRY  N/A 11/07/2017   Procedure: ESOPHAGEAL MANOMETRY (EM);  Surgeon: Ronnette Juniper, MD;  Location: [facility];  Service: Gastroenterology;  Laterality: N/A;  . ESOPHAGOGASTRODUODENOSCOPY (EGD) WITH PROPOFOL N/A 01/22/2018   Procedure: ESOPHAGOGASTRODUODENOSCOPY (EGD) WITH PROPOFOL;  Surgeon: Ronnette Juniper, MD;  Location: [facility];  Service: Gastroenterology;  Laterality:  N/A;  . HERNIA REPAIR    . LUMBAR LAMINECTOMY     x 2  . SAVORY DILATION N/A 01/22/2018   Procedure: SAVORY DILATION;  Surgeon: Ronnette Juniper, MD;  Location: [facility];  Service: Gastroenterology;  Laterality: N/A;  . TEE WITHOUT CARDIOVERSION N/A 01/29/2014   Procedure: TRANSESOPHAGEAL ECHOCARDIOGRAM (TEE);  Surgeon: Sueanne Margarita, MD;  Location: [facility] ENDOSCOPY;  Service: Cardiovascular;  Laterality: N/A;    Current Medications: Current Meds  Medication Sig  . aspirin EC 81 MG EC tablet Take 1 tablet (81 mg total) by mouth daily.  . Cholecalciferol (VITAMIN D3) 1000 units CAPS Take 1,000 Units by mouth daily.   . folic acid (FOLVITE) 1 MG tablet Take 1 mg by mouth daily.  Marland Kitchen KRILL OIL PO Take 750 mg by mouth daily.  Marland Kitchen leflunomide (ARAVA) 20 MG tablet Take 20 mg by mouth daily.  . methotrexate (RHEUMATREX) 2.5 MG tablet Take 25 mg by mouth every Sunday. Caution:Chemotherapy. Protect from light.   . metoprolol succinate (TOPROL-XL) 25 MG 24 hr tablet TAKE ONE TABLET BY MOUTH ONCE DAILY  . Multiple Vitamin (MULTIVITAMIN) tablet Take 1 tablet by mouth daily.  . Naphazoline HCl (CLEAR EYES OP) Place 1 drop into both eyes 3 (three) times daily as needed (for dry eyes).  . pantoprazole (PROTONIX) 40 MG tablet Take 40 mg by mouth daily.   . predniSONE (DELTASONE) 5 MG tablet Take 5 mg by mouth daily with breakfast.   . Turmeric 500 MG TABS Take 500 mg by mouth daily.  . vitamin B-12 (CYANOCOBALAMIN) 1000 MCG tablet Take 1,000 mcg by mouth daily.    Allergies:   Patient has no known allergies.   Social History   Socioeconomic History  . Marital status: Married    Spouse name: Not on file  . Number of children: 2  . Years of education: Not on file  . Highest education level: Not on file  Occupational History  . Occupation: Retired Market researcher at [employer]  . Financial resource strain: Not on file  . Food insecurity:    Worry: Not on file    Inability: Not on file  . Transportation  needs:    Medical: Not on file    Non-medical: Not on file  Tobacco Use  . Smoking status: Former Smoker    Packs/day: 0.50    Years: 10.00    Pack years: 5.00    Types: Cigarettes    Start date: 01/16/1974    Last attempt to quit: 09/05/1983    Years since quitting: 34.4  . Smokeless tobacco: Never Used  . Tobacco comment: quit 30 years ago  Substance and Sexual Activity  . Alcohol use: Yes    Alcohol/week: 0.0 oz    Comment: rare  . Drug use: No  . Sexual activity: Not on file  Lifestyle  . Physical activity:    Days per week: Not on file    Minutes per session: Not on file  . Stress: Not on file  Relationships  . Social connections:    Talks on phone: Not on file    Gets together: Not on file    Attends religious service:  Not on file    Active member of club or organization: Not on file    Attends meetings of clubs or organizations: Not on file    Relationship status: Not on file  Other Topics Concern  . Not on file  Social History Narrative  . Not on file     Family History:  The patient's family history includes Arthritis in his father; Heart Problems in his sister; Heart disease in his father; Other in his brother, daughter, mother, and sister; Suicidality in his brother.  ROS:   Please see the history of present illness. All other systems are reviewed and otherwise negative.    PHYSICAL EXAM:   VS:  BP 118/62   Pulse 73   Ht 5\' 3"  (1.6 m)   Wt 104 lb 12.8 oz (47.5 kg)   BMI 18.56 kg/m   BMI: Body mass index is 18.56 kg/m. GEN: Well developed thin elderly WM, in no acute distress HEENT: normocephalic, atraumatic Neck: no JVD, carotid bruits, or masses Cardiac: RRR; no murmurs, rubs, or gallops, no edema  Respiratory:  clear to auscultation bilaterally, normal work of breathing GI: soft, nontender, nondistended, + BS MS: no deformity or atrophy Skin: warm and dry, no rash, ecchymosis length of the dorsum of R forearm Neuro:  Alert and Oriented x 3,  Strength and sensation are intact, follows commands Psych: euthymic mood, full affect  Wt Readings from Last 3 Encounters:  01/29/18 104 lb 12.8 oz (47.5 kg)  01/23/18 101 lb 3.2 oz (45.9 kg)  01/22/18 98 lb (44.5 kg)      Studies/Labs Reviewed:   EKG:  EKG was ordered today and personally reviewed by me and demonstrates NSR 73bpm no acute changes.  Recent Labs: 01/23/2018: BUN 17; Creatinine, Ser 0.91; Hemoglobin 8.9; Platelets 125; Potassium 4.1; Sodium 139   Lipid Panel    Component Value Date/Time   CHOL  01/14/2008 0450    174        ATP III CLASSIFICATION:  <200     mg/dL   Desirable  200-239  mg/dL   Borderline High  >=240    mg/dL   High   TRIG 43 01/14/2008 0450   HDL 65 01/14/2008 0450   CHOLHDL 2.7 01/14/2008 0450   VLDL 9 01/14/2008 0450   LDLCALC (H) 01/14/2008 0450    100        Total Cholesterol/HDL:CHD Risk Coronary Heart Disease Risk Table                     Men   Women  1/2 Average Risk   3.4   3.3    Additional studies/ records that were reviewed today include: Summarized above.   ASSESSMENT & PLAN:   1. Pre-operative evaluation - revised cardiac risk index is calculated at 0.4%, therefore, based on ACC/AHA guidelines, the patient would be at acceptable risk for the planned procedure without further cardiovascular testing. However, I am quite concerned that his hemoglobin level is about 3g lower than prior baseline of 11.7 in 2016. This was newly low to 8.9 in 11/2017 per phone call to rheumatology at which time the patient was advised to f/u with PCP but it is not clear that he did so. (He did have similar anemia and thrombocytopenia in 2009 but it doesn't make sense to compare values to then, because this was in the setting of being post-op from cardiac surgery.) I would therefore recommend that surgical team reach out to primary  care for medical clearance of shoulder surgery prior to proceeding with shoulder surgery. Will check CBC and anemia panel in  clinic today with plan to route to primary care. From a cardiac standpoint, if cleared by medical doctor, he would be alright from our standpoint to come off aspirin for 5-7 days prior to procedure per surgeon's preference. 2. Microcytic anemia - check CBC, anemia panel today. 3. Nonobstructive CAD - no recurrent symptoms to suggest angina. He is not currently on a statin. Given his acute issues with shoulder pain, can hold off and have him follow up with PCP to monitor cholesterol as it appears he will also need to see them closely for his anemia as well. His most recent cholesterol panel is not showing up on his [facility] report as primary care is not in the same medical record system that we are. Blood pressure appears well controlled today. 4. AS s/p prosthetic tissue AVR - stable by echocardiogram 2017. Reviewed SBE precautions with patient and he states he is aware of this and has a standing rx on file for abx for his dental work coming up in June. He has no fevers, chills, fatigue or any other signs of infection.   Disposition: F/u with Dr. Radford Pax in 6 months.  Medication Adjustments/Labs and Tests Ordered: Current medicines are reviewed at length with the patient today.  Concerns regarding medicines are outlined above. Medication changes, Labs and Tests ordered today are summarized above and listed in the Patient Instructions accessible in Encounters.   Signed, Charlie Pitter, PA-C  01/29/2018 1:46 PM    [facility] [place], [place], [place]  [address] Phone: [phone]; Fax: [phone]

## 2018-01-29 ENCOUNTER — Ambulatory Visit: Payer: Medicare Other | Admitting: Physician Assistant

## 2018-01-29 ENCOUNTER — Telehealth: Payer: Self-pay | Admitting: Physician Assistant

## 2018-01-29 ENCOUNTER — Encounter: Payer: Self-pay | Admitting: Physician Assistant

## 2018-01-29 VITALS — BP 118/62 | HR 73 | Ht 63.0 in | Wt 104.8 lb

## 2018-01-29 DIAGNOSIS — I2510 Atherosclerotic heart disease of native coronary artery without angina pectoris: Secondary | ICD-10-CM | POA: Diagnosis not present

## 2018-01-29 DIAGNOSIS — D509 Iron deficiency anemia, unspecified: Secondary | ICD-10-CM

## 2018-01-29 DIAGNOSIS — Z952 Presence of prosthetic heart valve: Secondary | ICD-10-CM

## 2018-01-29 DIAGNOSIS — Z01810 Encounter for preprocedural cardiovascular examination: Secondary | ICD-10-CM

## 2018-01-29 DIAGNOSIS — D649 Anemia, unspecified: Secondary | ICD-10-CM

## 2018-01-29 NOTE — Telephone Encounter (Signed)
Called [facility], spoke with Margarita Grizzle. She has been made aware that the pt has been cleared from a Cardiac Standpoint, but pt will need Medical Clearance due to Hgb 8.9. Per Margarita Grizzle, she advised that she already has a clearance from Dr. Laurann Montana, PCP, for medical clearance. Per Melina Copa, PA-C, I have advised them to have the medical clearance revisited due to Hgb of 8.9.  Margarita Grizzle did advise that she will resend to Dr. Wonda Amis office and have them take another look, also making them aware of the low HGB.

## 2018-01-29 NOTE — Telephone Encounter (Signed)
Joshua Hudson, please call Dr. Gilberto Better office - patient was seen in clinic for pre-operative clearance. From a cardiac standpoint he appears to be stable. However, his hemoglobin checked by the surgical team on 5/22 was low at 8.9. It does not appear this was addressed. As we investigated during this visit today, primary care did not have a Hgb on file since 2014, and rheumatology had a Hgb of 8.9 on file in 11/2017 at which time he was asked to follow up with primary care to discuss evaluation of possible blood los. Our records would indicate in 2016 it was 11.7 so this definitely needs further investigation. Please let surgical team know that from cardiac standpoint we do not anticipate any further cardiac studies prior to surgery but I would recommend they consult with patient's medical doctor to discuss general medical clearance regarding his new anemia before proceeding with surgery. I will also be routing my note stating the same recommendations. Thank you. Pritika Alvarez PA-C

## 2018-01-29 NOTE — Patient Instructions (Addendum)
Medication Instructions:  Your physician recommends that you continue on your current medications as directed. Please refer to the Current Medication list given to you today.   Labwork: TODAY:  CBC  Testing/Procedures: None ordered  Follow-Up: Your physician wants you to follow-up in: [place] DR. Mallie Snooks will receive a reminder letter in the mail two months in advance. If you don't receive a letter, please call our office to schedule the follow-up appointment.   Any Other Special Instructions Will Be Listed Below (If Applicable).  Endocarditis Information  You may be at risk for developing endocarditis since you have  an artificial heart valve  or a repaired heart valve. Endocarditis is an infection of the lining of the heart or heart valves.   Certain surgical and dental procedures may put you at risk, such as teeth cleaning or other dental procedures or any surgery involving the respiratory, urinary, gastrointestinal tract, gallbladder or prostate.   Notify your doctor or dentist before having any invasive procedures. You will need to take antibiotics before certain procedures.   To prevent endocarditis, maintain good oral health. Seek prompt medical attention for any mouth/gum, skin or urinary tract infections.     If you need a refill on your cardiac medications before your next appointment, please call your pharmacy.

## 2018-01-29 NOTE — Addendum Note (Signed)
Addended by: Gaetano Net on: 01/29/2018 02:27 PM   Modules accepted: Orders

## 2018-01-30 ENCOUNTER — Telehealth: Payer: Self-pay | Admitting: *Deleted

## 2018-01-30 LAB — CBC
HEMOGLOBIN: 8.6 g/dL — AB (ref 13.0–17.7)
Hematocrit: 29.3 % — ABNORMAL LOW (ref 37.5–51.0)
MCH: 20.4 pg — ABNORMAL LOW (ref 26.6–33.0)
MCHC: 29.4 g/dL — AB (ref 31.5–35.7)
MCV: 70 fL — ABNORMAL LOW (ref 79–97)
Platelets: 393 10*3/uL (ref 150–450)
RBC: 4.21 x10E6/uL (ref 4.14–5.80)
RDW: 19.6 % — ABNORMAL HIGH (ref 12.3–15.4)
WBC: 5.4 10*3/uL (ref 3.4–10.8)

## 2018-01-30 LAB — IRON AND TIBC
Iron Saturation: 88 % (ref 15–55)
Iron: 190 ug/dL — ABNORMAL HIGH (ref 38–169)
Total Iron Binding Capacity: 215 ug/dL — ABNORMAL LOW (ref 250–450)
UIBC: 25 ug/dL — ABNORMAL LOW (ref 111–343)

## 2018-01-30 LAB — VITAMIN B12: Vitamin B-12: 949 pg/mL (ref 232–1245)

## 2018-01-30 LAB — FOLATE: FOLATE: 10.4 ng/mL (ref >3.0)

## 2018-01-30 LAB — FERRITIN: Ferritin: 407 ng/mL — ABNORMAL HIGH (ref 30–400)

## 2018-01-30 NOTE — Addendum Note (Signed)
Addended by: Gaetano Net on: 01/30/2018 10:41 AM   Modules accepted: Orders

## 2018-01-30 NOTE — Telephone Encounter (Signed)
Called Margarita Grizzle @ [facility] re: pt's lab results and surgical clearance. I advised her that pt's HGB was actually lower and pt has an appt with Dr. Laurann Montana on Friday, 02/01/18 @ 3:00 and that they may want to touch base with the pt and post pone his sx. If she had any questions, to contact the office.

## 2018-02-01 ENCOUNTER — Inpatient Hospital Stay (HOSPITAL_COMMUNITY): Admission: RE | Admit: 2018-02-01 | Payer: Medicare Other | Source: Ambulatory Visit | Admitting: Orthopedic Surgery

## 2018-02-01 ENCOUNTER — Encounter (HOSPITAL_COMMUNITY): Admission: RE | Payer: Self-pay | Source: Ambulatory Visit

## 2018-02-01 SURGERY — ARTHROPLASTY, SHOULDER, TOTAL, REVERSE
Anesthesia: Choice | Site: Shoulder | Laterality: Right

## 2018-02-27 ENCOUNTER — Other Ambulatory Visit: Payer: Self-pay

## 2018-02-27 ENCOUNTER — Encounter (HOSPITAL_COMMUNITY): Payer: Self-pay | Admitting: *Deleted

## 2018-02-27 NOTE — Progress Notes (Signed)
Pt denies any acute cardiopulmonary issues. Pt stated that he had a stress test but is unsure of the year (> 5 years ago). Pt stated that he is under the care of Dr. [name], Cardiology. Pt stated " when I asked about my Aspirin they said to call Dr. Radford Pax, I didn't take my Aspirin today because that is the reason the surgery got cancelled before. " Pt made aware to stop taking vitamins, fish oil, Krill oil, Turmeric and herbal medications. Do not take any NSAIDs ie: Ibuprofen, Advil, Naproxen (Aleve), Motrin, BC and Goody Powder. Pt verbalized understanding of all pre-op instructions. Will contact anesthesia and surgeon regarding pre-op Aspirin instructions.

## 2018-02-28 NOTE — Progress Notes (Addendum)
Spoke with Margarita Grizzle, Surgical Coordinator, to make MD aware that pt stated that he had stopped taking Aspirin and not been given pre-op instructions regarding Aspirin. Margarita Grizzle stated that she would follow up with pt.

## 2018-02-28 NOTE — Progress Notes (Signed)
Anesthesia Chart Review:  Pt is a same day work up    Case:  [id number] Date/Time:  03/01/18 1245   Procedure:  RIGHT REVERSE SHOULDER ARTHROPLASTY (Right Shoulder)   Anesthesia type:  Choice   Pre-op diagnosis:  right shoulder osteoarthritis; rotator cuff unsufficiency   Location:  [place] OR ROOM 04 / [place] [facility]   Surgeon:  Netta Cedars, MD      DISCUSSION: - Pt is an 83 year old male with hx severe aortic stenosis (s/p prosthetic valve replacement 2009), post-op afib (no recurrence)  - Pt surgery originally scheduled for 02/01/18, but was postponed in order for pt to see cardiology for clearance and for anemia.   - Pt has cardiac clearance for surgery  - Pt has medical clearance for surgery.  Pt dx by PCP with iron deficiency anemia and tx with iron supplement.  Hgb was 8.6 on 01/29/18; was up to 9.3 at PCP's office 02/12/18.     PROVIDERS: PCP is Lavone Orn, MD who cleared pt for surgery at last office visit 02/12/18  Patient Care Team: Sueanne Margarita, MD as Consulting Physician (Cardiology). Pt cleared for surgery at last office visit 01/29/18 with Melina Copa, PA.   LABS: Will be obtained day of surgery    EKG 01/29/18: NSR   CV:  Echo 06/01/16:  - Status, risk factors: Aortic Valve Replacement (25 mm Edwardstissue valve, 2009) - Left ventricle: The cavity size was normal. Wall thickness wasnormal. Systolic function was normal. The estimated ejectionfraction was in the range of 60% to 65%. Wall motion was normal;there were no regional wall motion abnormalities. The study isnot technically sufficient to allow evaluation of LV diastolicfunction. - Aortic valve: A bioprosthesis was present. Functioning normally. Mild diffuse thickening. There was trivial regurgitation. - Mitral valve: Calcified annulus. Mildly thickened leaflets. There was mild regurgitation. - Left atrium: The atrium was mildly dilated. - Right atrium: The atrium was mildly dilated. - Impressions: Aortic  regurgitation appears improved when compared to priorstudy.  CT coronary morphology 02/16/14:  1) No perivalvular aortic abscess. The tissue AVR is well seated with intact sewing ring. I reviewed the patients TTE/TEE from 5/19 and 01/29/14 The suspicious area was from off axis imaging of the valve and seeing the dilated non coronary sinus at the level of the AVR. 2) Normal aortic root 3) Calcium score 926 81st percentile for age and sex matched disease 4) Non obstructive calcific disease in all 3 major coronary arteries  Cardiac cath 01/13/08:  1. Nonobstructive coronary disease (OM2 20-30%). 2. Severe aortic stenosis by echocardiogram. 3. Normal left ventricular function by echocardiogram. 4. Normal right heart pressures.   Past Medical History:  Diagnosis Date  . Colon polyps    s/p diverticular perforation requiring 2-stage repair  . Coronary artery disease    a. 20-30% OM2 by cath 2009. b. nonobstructive of all 3 vessels by coronary CTA 2015.  . Diverticulosis   . DJD (degenerative joint disease), lumbosacral   . Esophageal stricture   . GERD (gastroesophageal reflux disease)   . History of kidney stones   . Mitral regurgitation 05/16/2016   mild by echo 05/2016  . Occipital neuralgia   . Pneumonia   . Postoperative atrial fibrillation ([place]) 05/10/2015  . Rheumatoid arthritis ([place])    s/o long term steroids  . Rotator cuff arthropathy    right  . S/P aortic valve replacement with tissue   . SBE (subacute bacterial endocarditis) prophylaxis candidate    for dental procedures  .  Severe aortic stenosis    S/P prosthetic valve replacement w 25 mm Edwards like science percardial tissue valve,Turner - 01/2008    Past Surgical History:  Procedure Laterality Date  . APPENDECTOMY    . BOTOX INJECTION N/A 01/22/2018   Procedure: BOTOX INJECTION;  Surgeon: Ronnette Juniper, MD;  Location: [facility];  Service: Gastroenterology;  Laterality: N/A;  . CARDIAC CATHETERIZATION     09   . CARDIAC VALVE REPLACEMENT  01/2008   aortic valve replacement  . CATARACT EXTRACTION W/ INTRAOCULAR LENS  IMPLANT, BILATERAL    . COLON RESECTION     diverticulitis   . ESOPHAGEAL MANOMETRY N/A 11/07/2017   Procedure: ESOPHAGEAL MANOMETRY (EM);  Surgeon: Ronnette Juniper, MD;  Location: [facility];  Service: Gastroenterology;  Laterality: N/A;  . ESOPHAGOGASTRODUODENOSCOPY (EGD) WITH PROPOFOL N/A 01/22/2018   Procedure: ESOPHAGOGASTRODUODENOSCOPY (EGD) WITH PROPOFOL;  Surgeon: Ronnette Juniper, MD;  Location: [facility];  Service: Gastroenterology;  Laterality: N/A;  . HERNIA REPAIR    . LUMBAR LAMINECTOMY     x 2  . SAVORY DILATION N/A 01/22/2018   Procedure: SAVORY DILATION;  Surgeon: Ronnette Juniper, MD;  Location: [facility];  Service: Gastroenterology;  Laterality: N/A;  . TEE WITHOUT CARDIOVERSION N/A 01/29/2014   Procedure: TRANSESOPHAGEAL ECHOCARDIOGRAM (TEE);  Surgeon: Sueanne Margarita, MD;  Location: [facility] ENDOSCOPY;  Service: Cardiovascular;  Laterality: N/A;    MEDICATIONS: No current facility-administered medications for this encounter.    Marland Kitchen aspirin EC 81 MG EC tablet  . folic acid (FOLVITE) 1 MG tablet  . leflunomide (ARAVA) 20 MG tablet  . methotrexate (RHEUMATREX) 2.5 MG tablet  . metoprolol succinate (TOPROL-XL) 25 MG 24 hr tablet  . Multiple Vitamin (MULTIVITAMIN) tablet  . Naphazoline HCl (CLEAR EYES OP)  . pantoprazole (PROTONIX) 40 MG tablet  . predniSONE (DELTASONE) 5 MG tablet  . Cholecalciferol (VITAMIN D3) 1000 units CAPS  . KRILL OIL PO  . Turmeric 500 MG TABS  . vitamin B-12 (CYANOCOBALAMIN) 1000 MCG tablet    If labs acceptable day of surgery, I anticipate pt can proceed with surgery as scheduled.  Willeen Cass, FNP-BC [facility]/Anesthesiology Phone: [phone] 02/28/2018 3:23 PM

## 2018-03-01 ENCOUNTER — Encounter (HOSPITAL_COMMUNITY)
Admission: RE | Disposition: A | Discharge status: Home / Self Care | Payer: Self-pay | Source: Ambulatory Visit | Attending: Orthopedic Surgery

## 2018-03-01 ENCOUNTER — Inpatient Hospital Stay (HOSPITAL_COMMUNITY): Payer: Medicare Other

## 2018-03-01 ENCOUNTER — Other Ambulatory Visit: Payer: Self-pay

## 2018-03-01 ENCOUNTER — Inpatient Hospital Stay (HOSPITAL_COMMUNITY): Payer: Medicare Other | Admitting: Physician Assistant

## 2018-03-01 ENCOUNTER — Encounter (HOSPITAL_COMMUNITY): Payer: Self-pay | Admitting: Surgery

## 2018-03-01 ENCOUNTER — Inpatient Hospital Stay (HOSPITAL_COMMUNITY)
Admission: RE | Admit: 2018-03-01 | Discharge: 2018-03-02 | DRG: 483 | Disposition: A | Discharge status: Home / Self Care | Payer: Medicare Other | Source: Ambulatory Visit | Attending: Orthopedic Surgery | Admitting: Orthopedic Surgery

## 2018-03-01 DIAGNOSIS — I080 Rheumatic disorders of both mitral and aortic valves: Secondary | ICD-10-CM | POA: Diagnosis present

## 2018-03-01 DIAGNOSIS — Z8261 Family history of arthritis: Secondary | ICD-10-CM | POA: Diagnosis not present

## 2018-03-01 DIAGNOSIS — M069 Rheumatoid arthritis, unspecified: Secondary | ICD-10-CM | POA: Diagnosis present

## 2018-03-01 DIAGNOSIS — M19011 Primary osteoarthritis, right shoulder: Secondary | ICD-10-CM | POA: Diagnosis present

## 2018-03-01 DIAGNOSIS — Z96611 Presence of right artificial shoulder joint: Secondary | ICD-10-CM

## 2018-03-01 DIAGNOSIS — I2510 Atherosclerotic heart disease of native coronary artery without angina pectoris: Secondary | ICD-10-CM | POA: Diagnosis present

## 2018-03-01 DIAGNOSIS — Z79899 Other long term (current) drug therapy: Secondary | ICD-10-CM | POA: Diagnosis not present

## 2018-03-01 DIAGNOSIS — Z7952 Long term (current) use of systemic steroids: Secondary | ICD-10-CM | POA: Diagnosis not present

## 2018-03-01 DIAGNOSIS — M75101 Unspecified rotator cuff tear or rupture of right shoulder, not specified as traumatic: Principal | ICD-10-CM | POA: Diagnosis present

## 2018-03-01 DIAGNOSIS — Z7982 Long term (current) use of aspirin: Secondary | ICD-10-CM

## 2018-03-01 DIAGNOSIS — D509 Iron deficiency anemia, unspecified: Secondary | ICD-10-CM | POA: Diagnosis present

## 2018-03-01 DIAGNOSIS — G8929 Other chronic pain: Secondary | ICD-10-CM | POA: Diagnosis present

## 2018-03-01 DIAGNOSIS — Z87891 Personal history of nicotine dependence: Secondary | ICD-10-CM | POA: Diagnosis not present

## 2018-03-01 DIAGNOSIS — K219 Gastro-esophageal reflux disease without esophagitis: Secondary | ICD-10-CM | POA: Diagnosis present

## 2018-03-01 DIAGNOSIS — Z953 Presence of xenogenic heart valve: Secondary | ICD-10-CM

## 2018-03-01 HISTORY — DX: Pneumonia, unspecified organism: J18.9

## 2018-03-01 HISTORY — DX: Other specific arthropathies, not elsewhere classified, unspecified shoulder: M12.819

## 2018-03-01 HISTORY — PX: REVERSE SHOULDER ARTHROPLASTY: SHX5054

## 2018-03-01 LAB — CBC
HEMATOCRIT: 28.4 % — AB (ref 39.0–52.0)
HEMOGLOBIN: 8.8 g/dL — AB (ref 13.0–17.0)
MCH: 21.3 pg — ABNORMAL LOW (ref 26.0–34.0)
MCHC: 31 g/dL (ref 30.0–36.0)
MCV: 68.6 fL — ABNORMAL LOW (ref 78.0–100.0)
Platelets: 140 10*3/uL — ABNORMAL LOW (ref 150–400)
RBC: 4.14 MIL/uL — AB (ref 4.22–5.81)
RDW: 18.6 % — ABNORMAL HIGH (ref 11.5–15.5)
WBC: 4.3 10*3/uL (ref 4.0–10.5)

## 2018-03-01 LAB — BASIC METABOLIC PANEL
ANION GAP: 7 (ref 5–15)
BUN: 10 mg/dL (ref 8–23)
CO2: 28 mmol/L (ref 22–32)
Calcium: 9.2 mg/dL (ref 8.9–10.3)
Chloride: 108 mmol/L (ref 98–111)
Creatinine, Ser: 0.98 mg/dL (ref 0.61–1.24)
GFR calc non Af Amer: >60 mL/min (ref >60)
Glucose, Bld: 98 mg/dL (ref 70–99)
POTASSIUM: 4 mmol/L (ref 3.5–5.1)
Sodium: 143 mmol/L (ref 135–145)

## 2018-03-01 LAB — PREPARE RBC (CROSSMATCH)

## 2018-03-01 SURGERY — ARTHROPLASTY, SHOULDER, TOTAL, REVERSE
Anesthesia: General | Site: Shoulder | Laterality: Right

## 2018-03-01 MED ORDER — ROCURONIUM BROMIDE 100 MG/10ML IV SOLN
INTRAVENOUS | Status: DC | PRN
  Administered 2018-03-01: 40 mg via INTRAVENOUS

## 2018-03-01 MED ORDER — DOCUSATE SODIUM 100 MG PO CAPS
100.0000 mg | ORAL_CAPSULE | Freq: Two times a day (BID) | ORAL | Status: DC
  Administered 2018-03-01 – 2018-03-02 (×2): 100 mg via ORAL
  Filled 2018-03-01 (×2): qty 1

## 2018-03-01 MED ORDER — MENTHOL 3 MG MT LOZG: 1.0000 | LOZENGE | OROMUCOSAL | Status: DC | PRN

## 2018-03-01 MED ORDER — VITAMIN B-12 1000 MCG PO TABS
1000.0000 ug | ORAL_TABLET | Freq: Every day | ORAL | Status: DC
  Administered 2018-03-01 – 2018-03-02 (×2): 1000 ug via ORAL
  Filled 2018-03-01 (×2): qty 1

## 2018-03-01 MED ORDER — ONDANSETRON HCL 4 MG PO TABS: 4.0000 mg | ORAL_TABLET | Freq: Four times a day (QID) | ORAL | Status: DC | PRN

## 2018-03-01 MED ORDER — CEFAZOLIN SODIUM-DEXTROSE 2-4 GM/100ML-% IV SOLN
2.0000 g | Freq: Four times a day (QID) | INTRAVENOUS | Status: DC
  Administered 2018-03-01 – 2018-03-02 (×2): 2 g via INTRAVENOUS
  Filled 2018-03-01 (×3): qty 100

## 2018-03-01 MED ORDER — MEPERIDINE HCL 50 MG/ML IJ SOLN: 6.2500 mg | INTRAMUSCULAR | Status: DC | PRN

## 2018-03-01 MED ORDER — HYDROCODONE-ACETAMINOPHEN 5-325 MG PO TABS: 0.5000 | ORAL_TABLET | ORAL | 0 refills | Status: DC | PRN

## 2018-03-01 MED ORDER — PANTOPRAZOLE SODIUM 40 MG PO TBEC
40.0000 mg | DELAYED_RELEASE_TABLET | Freq: Every day | ORAL | Status: DC
  Administered 2018-03-02: 40 mg via ORAL
  Filled 2018-03-01: qty 1

## 2018-03-01 MED ORDER — ACETAMINOPHEN 500 MG PO TABS
500.0000 mg | ORAL_TABLET | Freq: Four times a day (QID) | ORAL | Status: DC
  Administered 2018-03-01: 500 mg via ORAL
  Filled 2018-03-01 (×3): qty 1

## 2018-03-01 MED ORDER — LIDOCAINE 2% (20 MG/ML) 5 ML SYRINGE
INTRAMUSCULAR | Status: AC
  Filled 2018-03-01: qty 5

## 2018-03-01 MED ORDER — MIDAZOLAM HCL 2 MG/2ML IJ SOLN
INTRAMUSCULAR | Status: AC
  Filled 2018-03-01: qty 2

## 2018-03-01 MED ORDER — TRANEXAMIC ACID 1000 MG/10ML IV SOLN
1000.0000 mg | Freq: Once | INTRAVENOUS | Status: AC
  Administered 2018-03-01: 1000 mg via INTRAVENOUS
  Filled 2018-03-01: qty 10

## 2018-03-01 MED ORDER — BUPIVACAINE-EPINEPHRINE 0.25% -1:200000 IJ SOLN
INTRAMUSCULAR | Status: DC | PRN
  Administered 2018-03-01: 7 mL

## 2018-03-01 MED ORDER — PHENYLEPHRINE HCL 10 MG/ML IJ SOLN
INTRAVENOUS | Status: DC | PRN
  Administered 2018-03-01: 50 ug/min via INTRAVENOUS

## 2018-03-01 MED ORDER — SODIUM CHLORIDE 0.9 % IV SOLN
INTRAVENOUS | Status: DC
  Administered 2018-03-01: 20:00:00 via INTRAVENOUS

## 2018-03-01 MED ORDER — MORPHINE SULFATE (PF) 2 MG/ML IV SOLN
0.5000 mg | INTRAVENOUS | Status: DC | PRN
  Administered 2018-03-02 (×2): 1 mg via INTRAVENOUS
  Filled 2018-03-01 (×2): qty 1

## 2018-03-01 MED ORDER — ADULT MULTIVITAMIN W/MINERALS CH
1.0000 | ORAL_TABLET | Freq: Every day | ORAL | Status: DC
  Administered 2018-03-01 – 2018-03-02 (×2): 1 via ORAL
  Filled 2018-03-01 (×2): qty 1

## 2018-03-01 MED ORDER — EPHEDRINE SULFATE 50 MG/ML IJ SOLN
INTRAMUSCULAR | Status: DC | PRN
  Administered 2018-03-01: 5 mg via INTRAVENOUS
  Administered 2018-03-01: 10 mg via INTRAVENOUS

## 2018-03-01 MED ORDER — FENTANYL CITRATE (PF) 100 MCG/2ML IJ SOLN: 25.0000 ug | INTRAMUSCULAR | Status: DC | PRN

## 2018-03-01 MED ORDER — LIDOCAINE 2% (20 MG/ML) 5 ML SYRINGE
INTRAMUSCULAR | Status: DC | PRN
  Administered 2018-03-01: 50 mg via INTRAVENOUS

## 2018-03-01 MED ORDER — METOCLOPRAMIDE HCL 5 MG/ML IJ SOLN: 5.0000 mg | Freq: Three times a day (TID) | INTRAMUSCULAR | Status: DC | PRN

## 2018-03-01 MED ORDER — METHOTREXATE 2.5 MG PO TABS: 25.0000 mg | ORAL_TABLET | ORAL | Status: DC

## 2018-03-01 MED ORDER — CEFAZOLIN SODIUM-DEXTROSE 2-4 GM/100ML-% IV SOLN
2.0000 g | INTRAVENOUS | Status: AC
  Administered 2018-03-01: 2 g via INTRAVENOUS

## 2018-03-01 MED ORDER — PHENOL 1.4 % MT LIQD: 1.0000 | OROMUCOSAL | Status: DC | PRN

## 2018-03-01 MED ORDER — PHENYLEPHRINE HCL 10 MG/ML IJ SOLN
INTRAMUSCULAR | Status: AC
  Filled 2018-03-01: qty 2

## 2018-03-01 MED ORDER — SODIUM CHLORIDE 0.9 % IR SOLN
Status: DC | PRN
  Administered 2018-03-01: 1000 mL

## 2018-03-01 MED ORDER — SUGAMMADEX SODIUM 200 MG/2ML IV SOLN
INTRAVENOUS | Status: AC
  Filled 2018-03-01: qty 2

## 2018-03-01 MED ORDER — PROPOFOL 10 MG/ML IV BOLUS
INTRAVENOUS | Status: AC
  Filled 2018-03-01: qty 20

## 2018-03-01 MED ORDER — SUGAMMADEX SODIUM 200 MG/2ML IV SOLN
INTRAVENOUS | Status: DC | PRN
  Administered 2018-03-01: 90 mg via INTRAVENOUS

## 2018-03-01 MED ORDER — METHOCARBAMOL 500 MG PO TABS
500.0000 mg | ORAL_TABLET | Freq: Four times a day (QID) | ORAL | Status: DC | PRN
  Administered 2018-03-02 (×2): 500 mg via ORAL
  Filled 2018-03-01 (×2): qty 1

## 2018-03-01 MED ORDER — METOPROLOL SUCCINATE ER 25 MG PO TB24
25.0000 mg | ORAL_TABLET | Freq: Every day | ORAL | Status: DC
  Administered 2018-03-02: 25 mg via ORAL
  Filled 2018-03-01: qty 1

## 2018-03-01 MED ORDER — SODIUM CHLORIDE 0.9% IV SOLUTION: Freq: Once | INTRAVENOUS | Status: DC

## 2018-03-01 MED ORDER — METOCLOPRAMIDE HCL 5 MG PO TABS
5.0000 mg | ORAL_TABLET | Freq: Three times a day (TID) | ORAL | Status: DC | PRN
  Filled 2018-03-01: qty 1

## 2018-03-01 MED ORDER — BUPIVACAINE LIPOSOME 1.3 % IJ SUSP
INTRAMUSCULAR | Status: DC | PRN
  Administered 2018-03-01: 10 mL via PERINEURAL

## 2018-03-01 MED ORDER — KRILL OIL 1000 MG PO CAPS: 750.0000 mg | ORAL_CAPSULE | Freq: Every day | ORAL | Status: DC

## 2018-03-01 MED ORDER — PREDNISONE 5 MG PO TABS
5.0000 mg | ORAL_TABLET | Freq: Every day | ORAL | Status: DC
  Administered 2018-03-02: 5 mg via ORAL
  Filled 2018-03-01: qty 1

## 2018-03-01 MED ORDER — PROPOFOL 10 MG/ML IV BOLUS
INTRAVENOUS | Status: DC | PRN
  Administered 2018-03-01: 20 mg via INTRAVENOUS
  Administered 2018-03-01: 100 mg via INTRAVENOUS

## 2018-03-01 MED ORDER — ACETAMINOPHEN 325 MG PO TABS
325.0000 mg | ORAL_TABLET | Freq: Four times a day (QID) | ORAL | Status: DC | PRN
  Administered 2018-03-02: 500 mg via ORAL

## 2018-03-01 MED ORDER — FENTANYL CITRATE (PF) 100 MCG/2ML IJ SOLN
100.0000 ug | Freq: Once | INTRAMUSCULAR | Status: AC
  Administered 2018-03-01: 100 ug via INTRAVENOUS

## 2018-03-01 MED ORDER — LACTATED RINGERS IV SOLN
INTRAVENOUS | Status: DC
  Administered 2018-03-01: 13:00:00 via INTRAVENOUS

## 2018-03-01 MED ORDER — ROCURONIUM BROMIDE 50 MG/5ML IV SOLN
INTRAVENOUS | Status: AC
  Filled 2018-03-01: qty 1

## 2018-03-01 MED ORDER — LEFLUNOMIDE 20 MG PO TABS
20.0000 mg | ORAL_TABLET | Freq: Every day | ORAL | Status: DC
  Filled 2018-03-01: qty 1

## 2018-03-01 MED ORDER — METHOCARBAMOL 1000 MG/10ML IJ SOLN
500.0000 mg | Freq: Four times a day (QID) | INTRAVENOUS | Status: DC | PRN
  Filled 2018-03-01: qty 5

## 2018-03-01 MED ORDER — POLYETHYLENE GLYCOL 3350 17 G PO PACK: 17.0000 g | PACK | Freq: Every day | ORAL | Status: DC | PRN

## 2018-03-01 MED ORDER — CHLORHEXIDINE GLUCONATE 4 % EX LIQD: 60.0000 mL | Freq: Once | CUTANEOUS | Status: DC

## 2018-03-01 MED ORDER — VITAMIN D 1000 UNITS PO TABS
1000.0000 [IU] | ORAL_TABLET | Freq: Every day | ORAL | Status: DC
  Administered 2018-03-01 – 2018-03-02 (×2): 1000 [IU] via ORAL
  Filled 2018-03-01 (×2): qty 1

## 2018-03-01 MED ORDER — GLYCOPYRROLATE PF 0.2 MG/ML IJ SOSY
PREFILLED_SYRINGE | INTRAMUSCULAR | Status: AC
  Filled 2018-03-01: qty 1

## 2018-03-01 MED ORDER — ASPIRIN EC 81 MG PO TBEC
81.0000 mg | DELAYED_RELEASE_TABLET | Freq: Every day | ORAL | Status: DC
  Administered 2018-03-01 – 2018-03-02 (×2): 81 mg via ORAL
  Filled 2018-03-01 (×2): qty 1

## 2018-03-01 MED ORDER — ONDANSETRON HCL 4 MG/2ML IJ SOLN: 4.0000 mg | Freq: Four times a day (QID) | INTRAMUSCULAR | Status: DC | PRN

## 2018-03-01 MED ORDER — FOLIC ACID 1 MG PO TABS
1.0000 mg | ORAL_TABLET | Freq: Every day | ORAL | Status: DC
  Administered 2018-03-01 – 2018-03-02 (×2): 1 mg via ORAL
  Filled 2018-03-01 (×2): qty 1

## 2018-03-01 MED ORDER — TRANEXAMIC ACID 1000 MG/10ML IV SOLN
1000.0000 mg | INTRAVENOUS | Status: AC
  Administered 2018-03-01: 1000 mg via INTRAVENOUS
  Filled 2018-03-01: qty 1100

## 2018-03-01 MED ORDER — HYDROCODONE-ACETAMINOPHEN 7.5-325 MG PO TABS
1.0000 | ORAL_TABLET | ORAL | Status: DC | PRN
  Administered 2018-03-02 (×2): 2 via ORAL
  Filled 2018-03-01 (×2): qty 2

## 2018-03-01 MED ORDER — ONDANSETRON HCL 4 MG/2ML IJ SOLN
INTRAMUSCULAR | Status: AC
  Filled 2018-03-01: qty 2

## 2018-03-01 MED ORDER — ONDANSETRON HCL 4 MG/2ML IJ SOLN
INTRAMUSCULAR | Status: DC | PRN
  Administered 2018-03-01: 4 mg via INTRAVENOUS

## 2018-03-01 MED ORDER — BUPIVACAINE HCL (PF) 0.5 % IJ SOLN
INTRAMUSCULAR | Status: DC | PRN
  Administered 2018-03-01: 10 mL

## 2018-03-01 MED ORDER — FENTANYL CITRATE (PF) 100 MCG/2ML IJ SOLN
INTRAMUSCULAR | Status: AC
  Administered 2018-03-01: 100 ug via INTRAVENOUS
  Filled 2018-03-01: qty 2

## 2018-03-01 MED ORDER — TURMERIC 500 MG PO TABS: 500.0000 mg | ORAL_TABLET | Freq: Every day | ORAL | Status: DC

## 2018-03-01 MED ORDER — HYDROCODONE-ACETAMINOPHEN 5-325 MG PO TABS
1.0000 | ORAL_TABLET | ORAL | Status: DC | PRN
  Administered 2018-03-02: 2 via ORAL
  Filled 2018-03-01: qty 2

## 2018-03-01 MED ORDER — BUPIVACAINE-EPINEPHRINE (PF) 0.25% -1:200000 IJ SOLN
INTRAMUSCULAR | Status: AC
  Filled 2018-03-01: qty 30

## 2018-03-01 SURGICAL SUPPLY — 72 items
BASEPLATE GLENOSPHERE 25 STD (Miscellaneous) ×2 IMPLANT
BASEPLATE GLENOSPHERE 25MM STD (Miscellaneous) ×1 IMPLANT
BIT DRILL 3.2 PERIPHERAL SCREW (BIT) ×3 IMPLANT
BIT DRILL 5/64X5 DISP (BIT) ×3 IMPLANT
BLADE SAG 18X100X1.27 (BLADE) ×3 IMPLANT
BONE SCREW THREAD 6.5X35MM (Screw) ×1 IMPLANT
CLOSURE STERI-STRIP 1/2X4 (GAUZE/BANDAGES/DRESSINGS) ×1
CLOSURE WOUND 1/2 X4 (GAUZE/BANDAGES/DRESSINGS) ×1
CLSR STERI-STRIP ANTIMIC 1/2X4 (GAUZE/BANDAGES/DRESSINGS) ×2 IMPLANT
COVER SURGICAL LIGHT HANDLE (MISCELLANEOUS) ×3 IMPLANT
DRAPE INCISE IOBAN 66X45 STRL (DRAPES) ×3 IMPLANT
DRAPE ORTHO SPLIT 77X108 STRL (DRAPES) ×4
DRAPE SURG ORHT 6 SPLT 77X108 (DRAPES) ×2 IMPLANT
DRAPE U-SHAPE 47X51 STRL (DRAPES) ×3 IMPLANT
DRSG ADAPTIC 3X8 NADH LF (GAUZE/BANDAGES/DRESSINGS) ×3 IMPLANT
DRSG PAD ABDOMINAL 8X10 ST (GAUZE/BANDAGES/DRESSINGS) ×3 IMPLANT
DURAPREP 26ML APPLICATOR (WOUND CARE) ×3 IMPLANT
ELECT BLADE 4.0 EZ CLEAN MEGAD (MISCELLANEOUS) ×3
ELECT NEEDLE TIP 2.8 STRL (NEEDLE) ×3 IMPLANT
ELECT REM PT RETURN 9FT ADLT (ELECTROSURGICAL) ×3
ELECTRODE BLDE 4.0 EZ CLN MEGD (MISCELLANEOUS) ×1 IMPLANT
ELECTRODE REM PT RTRN 9FT ADLT (ELECTROSURGICAL) ×1 IMPLANT
GAUZE SPONGE 4X4 12PLY STRL (GAUZE/BANDAGES/DRESSINGS) ×3 IMPLANT
GLENOSPHERE STANDARD 39 (Joint) ×3 IMPLANT
GLENOSPHERE STD 39 (Joint) ×1 IMPLANT
GLOVE BIOGEL PI ORTHO PRO 7.5 (GLOVE) ×2
GLOVE BIOGEL PI ORTHO PRO SZ8 (GLOVE) ×2
GLOVE ORTHO TXT STRL SZ7.5 (GLOVE) ×3 IMPLANT
GLOVE PI ORTHO PRO STRL 7.5 (GLOVE) ×1 IMPLANT
GLOVE PI ORTHO PRO STRL SZ8 (GLOVE) ×1 IMPLANT
GLOVE SURG ORTHO 8.5 STRL (GLOVE) ×3 IMPLANT
GOWN STRL REUS W/ TWL LRG LVL3 (GOWN DISPOSABLE) ×1 IMPLANT
GOWN STRL REUS W/ TWL XL LVL3 (GOWN DISPOSABLE) ×2 IMPLANT
GOWN STRL REUS W/TWL LRG LVL3 (GOWN DISPOSABLE) ×2
GOWN STRL REUS W/TWL XL LVL3 (GOWN DISPOSABLE) ×4
GUIDEWIRE GLENOID 2.5X220 (WIRE) ×3 IMPLANT
INSERT REV KIT SHOULDER 6X39 (Screw) ×3 IMPLANT
KIT BASIN OR (CUSTOM PROCEDURE TRAY) ×3 IMPLANT
KIT TURNOVER KIT B (KITS) ×3 IMPLANT
MANIFOLD NEPTUNE II (INSTRUMENTS) ×3 IMPLANT
NEEDLE 1/2 CIR MAYO (NEEDLE) ×3 IMPLANT
NEEDLE HYPO 25GX1X1/2 BEV (NEEDLE) ×3 IMPLANT
NS IRRIG 1000ML POUR BTL (IV SOLUTION) ×3 IMPLANT
PACK SHOULDER (CUSTOM PROCEDURE TRAY) ×3 IMPLANT
PAD ABD 8X10 STRL (GAUZE/BANDAGES/DRESSINGS) ×3 IMPLANT
PAD ARMBOARD 7.5X6 YLW CONV (MISCELLANEOUS) ×6 IMPLANT
RESTRAINT HEAD UNIVERSAL NS (MISCELLANEOUS) ×3 IMPLANT
SCREW 5.0X18 (Screw) ×3 IMPLANT
SCREW 5.5X26 (Screw) ×3 IMPLANT
SCREW BONE THREAD 6.5X35 (Screw) ×2 IMPLANT
SCREW PERIPHERAL 5.0X34 (Screw) ×3 IMPLANT
SLING ARM IMMOBILIZER MED (SOFTGOODS) ×3 IMPLANT
SPONGE LAP 18X18 X RAY DECT (DISPOSABLE) IMPLANT
SPONGE LAP 4X18 RFD (DISPOSABLE) ×3 IMPLANT
STEM HUMERAL STD PTC (Stem) ×3 IMPLANT
STRIP CLOSURE SKIN 1/2X4 (GAUZE/BANDAGES/DRESSINGS) ×2 IMPLANT
SUCTION FRAZIER HANDLE 10FR (MISCELLANEOUS) ×2
SUCTION TUBE FRAZIER 10FR DISP (MISCELLANEOUS) ×1 IMPLANT
SUT FIBERWIRE #2 38 T-5 BLUE (SUTURE)
SUT MNCRL AB 4-0 PS2 18 (SUTURE) ×3 IMPLANT
SUT VIC AB 0 CT2 27 (SUTURE) ×3 IMPLANT
SUT VIC AB 2-0 CT1 27 (SUTURE) ×2
SUT VIC AB 2-0 CT1 TAPERPNT 27 (SUTURE) ×1 IMPLANT
SUT VICRYL 0 CT 1 36IN (SUTURE) ×3 IMPLANT
SUTURE FIBERWR #2 38 T-5 BLUE (SUTURE) IMPLANT
SYR CONTROL 10ML LL (SYRINGE) ×3 IMPLANT
TAPE CLOTH SURG 6X10 WHT LF (GAUZE/BANDAGES/DRESSINGS) ×3 IMPLANT
TOWEL OR 17X24 6PK STRL BLUE (TOWEL DISPOSABLE) ×3 IMPLANT
TOWEL OR 17X26 10 PK STRL BLUE (TOWEL DISPOSABLE) ×3 IMPLANT
TOWER CARTRIDGE SMART MIX (DISPOSABLE) IMPLANT
TRAY SHOULDER REV OFFSET 1.5 (Joint) ×3 IMPLANT
YANKAUER SUCT BULB TIP NO VENT (SUCTIONS) ×3 IMPLANT

## 2018-03-01 NOTE — Transfer of Care (Signed)
Immediate Anesthesia Transfer of Care Note  Patient: Joshua Hudson  Procedure(s) Performed: RIGHT REVERSE SHOULDER ARTHROPLASTY (Right Shoulder)  Patient Location: PACU  Anesthesia Type:General  Level of Consciousness: awake, alert  and oriented  Airway & Oxygen Therapy: Patient Spontanous Breathing  Post-op Assessment: Report given to RN and Post -op Vital signs reviewed and stable  Post vital signs: Reviewed and stable  Last Vitals:  Vitals Value Taken Time  BP    Temp    Pulse 64 03/01/2018  4:22 PM  Resp 19 03/01/2018  4:22 PM  SpO2 100 % 03/01/2018  4:22 PM  Vitals shown include unvalidated device data.  Last Pain:  Vitals:   03/01/18 1049  TempSrc:   PainSc: 0-No pain      Patients Stated Pain Goal: 3 (25/95/63 8756)  Complications: No apparent anesthesia complications

## 2018-03-01 NOTE — Anesthesia Preprocedure Evaluation (Deleted)
Anesthesia Evaluation  Patient identified by MRN, date of birth, ID band Patient awake    Reviewed: Allergy & Precautions, H&P , NPO status , Patient's Chart, lab work & pertinent test results, reviewed documented beta blocker date and time   Airway Mallampati: II  TM Distance: >3 FB Neck ROM: full    Dental no notable dental hx.    Pulmonary former smoker,    Pulmonary exam normal breath sounds clear to auscultation       Cardiovascular Exercise Tolerance: Good + CAD  negative cardio ROS  + Valvular Problems/Murmurs  Rhythm:regular Rate:Normal     Neuro/Psych    GI/Hepatic GERD  Medicated,  Endo/Other    Renal/GU      Musculoskeletal  (+) Arthritis , Osteoarthritis,    Abdominal   Peds  Hematology  (+) anemia ,   Anesthesia Other Findings   Reproductive/Obstetrics                                                             Anesthesia Evaluation  Patient identified by MRN, date of birth, ID band Patient awake    Reviewed: Allergy & Precautions, NPO status , Patient's Chart, lab work & pertinent test results  Airway Mallampati: II  TM Distance: >3 FB Neck ROM: Full    Dental no notable dental hx.    Pulmonary neg pulmonary ROS, former smoker,    Pulmonary exam normal breath sounds clear to auscultation       Cardiovascular Normal cardiovascular exam+ dysrhythmias Atrial Fibrillation  Rhythm:Regular Rate:Normal  S/p AVR 2009   Neuro/Psych negative neurological ROS  negative psych ROS   GI/Hepatic negative GI ROS, Neg liver ROS,   Endo/Other  negative endocrine ROS  Renal/GU negative Renal ROS  negative genitourinary   Musculoskeletal  (+) Arthritis , Rheumatoid disorders and steroids,    Abdominal   Peds negative pediatric ROS (+)  Hematology negative hematology ROS (+)   Anesthesia Other Findings   Reproductive/Obstetrics negative OB ROS                             Anesthesia Physical Anesthesia Plan  ASA: III  Anesthesia Plan: MAC   Post-op Pain Management:    Induction:   PONV Risk Score and Plan: 1 and Ondansetron and Treatment may vary due to age or medical condition  Airway Management Planned: Simple Face Mask  Additional Equipment:   Intra-op Plan:   Post-operative Plan:   Informed Consent: I have reviewed the patients History and Physical, chart, labs and discussed the procedure including the risks, benefits and alternatives for the proposed anesthesia with the patient or authorized representative who has indicated his/her understanding and acceptance.   Dental advisory given  Plan Discussed with: CRNA  Anesthesia Plan Comments:         Anesthesia Quick Evaluation                                   Anesthesia Evaluation  Patient identified by MRN, date of birth, ID band Patient awake    Reviewed: Allergy & Precautions, NPO status , Patient's Chart, lab work & pertinent test results  Airway Mallampati: II  TM Distance: >3 FB Neck ROM: Full    Dental no notable dental hx.    Pulmonary neg pulmonary ROS, former smoker,    Pulmonary exam normal breath sounds clear to auscultation       Cardiovascular Normal cardiovascular exam+ dysrhythmias Atrial Fibrillation  Rhythm:Regular Rate:Normal  S/p AVR 2009   Neuro/Psych negative neurological ROS  negative psych ROS   GI/Hepatic negative GI ROS, Neg liver ROS,   Endo/Other  negative endocrine ROS  Renal/GU negative Renal ROS  negative genitourinary   Musculoskeletal  (+) Arthritis , Rheumatoid disorders and steroids,    Abdominal   Peds negative pediatric ROS (+)  Hematology negative hematology ROS (+)   Anesthesia Other Findings   Reproductive/Obstetrics negative OB ROS                             Anesthesia Physical Anesthesia Plan  ASA: III  Anesthesia  Plan: MAC   Post-op Pain Management:    Induction:   PONV Risk Score and Plan: 1 and Ondansetron and Treatment may vary due to age or medical condition  Airway Management Planned: Simple Face Mask  Additional Equipment:   Intra-op Plan:   Post-operative Plan:   Informed Consent: I have reviewed the patients History and Physical, chart, labs and discussed the procedure including the risks, benefits and alternatives for the proposed anesthesia with the patient or authorized representative who has indicated his/her understanding and acceptance.   Dental advisory given  Plan Discussed with: CRNA  Anesthesia Plan Comments:         Anesthesia Quick Evaluation                                   Anesthesia Evaluation  Patient identified by MRN, date of birth, ID band Patient awake    Reviewed: Allergy & Precautions, NPO status , Patient's Chart, lab work & pertinent test results  Airway Mallampati: II  TM Distance: >3 FB Neck ROM: Full    Dental no notable dental hx.    Pulmonary neg pulmonary ROS, former smoker,    Pulmonary exam normal breath sounds clear to auscultation       Cardiovascular Normal cardiovascular exam+ dysrhythmias Atrial Fibrillation  Rhythm:Regular Rate:Normal  S/p AVR 2009   Neuro/Psych negative neurological ROS  negative psych ROS   GI/Hepatic negative GI ROS, Neg liver ROS,   Endo/Other  negative endocrine ROS  Renal/GU negative Renal ROS  negative genitourinary   Musculoskeletal  (+) Arthritis , Rheumatoid disorders and steroids,    Abdominal   Peds negative pediatric ROS (+)  Hematology negative hematology ROS (+)   Anesthesia Other Findings   Reproductive/Obstetrics negative OB ROS                             Anesthesia Physical Anesthesia Plan  ASA: III  Anesthesia Plan: MAC   Post-op Pain Management:    Induction:   PONV Risk Score and Plan: 1 and Ondansetron and  Treatment may vary due to age or medical condition  Airway Management Planned: Simple Face Mask  Additional Equipment:   Intra-op Plan:   Post-operative Plan:   Informed Consent: I have reviewed the patients History and Physical, chart, labs and discussed the procedure including the risks, benefits and alternatives for the proposed anesthesia with the patient or authorized  representative who has indicated his/her understanding and acceptance.   Dental advisory given  Plan Discussed with: CRNA  Anesthesia Plan Comments:         Anesthesia Quick Evaluation                                   Anesthesia Evaluation  Patient identified by MRN, date of birth, ID band Patient awake    Reviewed: Allergy & Precautions, NPO status , Patient's Chart, lab work & pertinent test results  Airway Mallampati: II  TM Distance: >3 FB Neck ROM: Full    Dental no notable dental hx.    Pulmonary neg pulmonary ROS, former smoker,    Pulmonary exam normal breath sounds clear to auscultation       Cardiovascular Normal cardiovascular exam+ dysrhythmias Atrial Fibrillation  Rhythm:Regular Rate:Normal  S/p AVR 2009   Neuro/Psych negative neurological ROS  negative psych ROS   GI/Hepatic negative GI ROS, Neg liver ROS,   Endo/Other  negative endocrine ROS  Renal/GU negative Renal ROS  negative genitourinary   Musculoskeletal  (+) Arthritis , Rheumatoid disorders and steroids,    Abdominal   Peds negative pediatric ROS (+)  Hematology negative hematology ROS (+)   Anesthesia Other Findings   Reproductive/Obstetrics negative OB ROS                             Anesthesia Physical Anesthesia Plan  ASA: III  Anesthesia Plan: MAC   Post-op Pain Management:    Induction:   PONV Risk Score and Plan: 1 and Ondansetron and Treatment may vary due to age or medical condition  Airway Management Planned: Simple Face Mask  Additional  Equipment:   Intra-op Plan:   Post-operative Plan:   Informed Consent: I have reviewed the patients History and Physical, chart, labs and discussed the procedure including the risks, benefits and alternatives for the proposed anesthesia with the patient or authorized representative who has indicated his/her understanding and acceptance.   Dental advisory given  Plan Discussed with: CRNA  Anesthesia Plan Comments:         Anesthesia Quick Evaluation  Anesthesia Physical Anesthesia Plan  ASA: III  Anesthesia Plan: General   Post-op Pain Management:  Regional for Post-op pain   Induction: Intravenous  PONV Risk Score and Plan: 2 and Ondansetron and Treatment may vary due to age or medical condition  Airway Management Planned: Oral ETT  Additional Equipment:   Intra-op Plan:   Post-operative Plan: Extubation in OR  Informed Consent: I have reviewed the patients History and Physical, chart, labs and discussed the procedure including the risks, benefits and alternatives for the proposed anesthesia with the patient or authorized representative who has indicated his/her understanding and acceptance.   Dental Advisory Given  Plan Discussed with: CRNA, Anesthesiologist and Surgeon  Anesthesia Plan Comments: (  )        Anesthesia Quick Evaluation

## 2018-03-01 NOTE — Op Note (Signed)
NAME: Joshua, Hudson MEDICAL RECORD [id number] ACCOUNT [id number] DATE OF BIRTH:08/21/1935 FACILITY: [facility] LOCATION: [facility] PHYSICIAN:STEVEN Orlena Sheldon, MD  OPERATIVE REPORT  DATE OF PROCEDURE:  03/01/2018  PREOPERATIVE DIAGNOSIS:  Right shoulder rotator cuff tear arthropathy.  POSTOPERATIVE DIAGNOSIS:  Right shoulder rotator cuff tear arthropathy.  PROCEDURE PERFORMED:  Right reverse total shoulder replacement using a 20 A Aequalis reverse shoulder replacement.  ATTENDING SURGEON:  Esmond Plants, MD  ASSISTANT:  Darol Destine, [place], who was scrubbed the entire procedure and necessary for satisfactory completion of surgery.  ANESTHESIA:  General anesthesia was plus interscalene block.  ESTIMATED BLOOD LOSS:  150 mL.   FLUID REPLACEMENT:  2 units packed red blood cells as well as 1000 mL crystalloid.    INSTRUMENT COUNTS:  Correct.  COMPLICATIONS:  None.   ANTIBIOTICS:  Perioperative antibiotics were given.  INDICATIONS:  The patient is an 83 year old male with worsening right shoulder pain and dysfunction secondary to a rotator cuff tear arthropathy.  Despite a long period of conservative management, the patient has had disabling pain and desires reverse  shoulder replacement to relieve pain and restore function.  Risks and benefits of surgical management versus pain management were discussed in detail with the patient and the patient wished to proceed with surgery.  Informed consent obtained.  DESCRIPTION OF PROCEDURE:  After an adequate level of anesthesia was achieved, the patient was positioned in the modified beach chair position.  Right shoulder correctly identified and sterilely prepped and draped in the usual manner.  An obvious  effusion was noted.  After a timeout, verifying correct patient, correct site.  We approached the shoulder through an anterior deltopectoral incision starting at the coracoid process extending down to the anterior humerus.  Dissection down  through  subcutaneous tissues using Bovie electrocautery.  We identified cephalic vein, took it laterally with the deltoid.  Pectoralis was taken medially.  Conjoined tendon was identified and taken medially.  We then identified the biceps tendon and the  subscapularis.  The subscap was actually pretty good looking and intact.  We did a biceps tenodesis with in situ suturing using 0 Vicryl figure-of-eight suture x2 incorporating the pectoralis tendon.  Next, we released the subscapularis with a  subscapularis peel subperiosteally off the lesser tuberosity.  We tagged with #2 FiberWire to repair at the end.  We then extended the shoulder and noted it was completely devoid of the supraspinatus, infraspinatus or even much of teres minor.  We then  placed a T-handled Crego over top of the humeral head and made our neck cut, which was in 20 degrees of retroversion with 132 degrees.  We used an oscillating saw for that using an external jig.  Next, we subluxed the humerus posteriorly and did a 360  degree capsular labral excision getting down to the glenoid bone.  We also removed the remaining rotator cuff tendon which was torn and retracted, but still visible in the joint.  At this point, we placed our central guide pin for reaming for the  baseplate.  We then went ahead and did our reaming for the baseplate getting good bone good bony support all the way around.  We then drilled our central lug hole.  We then selected the 25 mm baseplate with a central screw of the appropriate length, 35  mm long and inserted the baseplate in position and screwed the screw tight.  They gave good compression of the baseplate.  We then did lock screws inferiorly and superiorly with  a nonlocked posteriorly.  We had excellent purchase with her baseplate  screws and good stability of the baseplate.  We then trialed first with the 39 lateral offset glenosphere eventually going to a 39 standard glenosphere without the offset.  That  gave [place] good coverage inferiorly.  We were well below the inferior border of  the glenoid.  We did a finger sweep to make sure the axillary nerve was free and clear.  We placed the real glenosphere into position, which was a 39 standard and screwed that into position.  We impacted it first and then screwed it getting a nice tight  screw fit.  We next went to the humeral side did sounding initially and then hand reaming up to a size 6 and then did our trials with the 5 and then the 6, 20 A stem.  With that 20 A stem in place, we trialled with the +1.5 offset tray and then also the  6 mm standard offset poly and that gave [place] nice stability and good range of motion.  We removed all the trial components on the humeral side.  We irrigated thoroughly.  We then drilled in the lesser tuberosity and placed #2 FiberWire in the bone and  then we used an impaction grafting technique with available bone graft from the head and then impacted our press-fit stem into place selecting a 1.5 tray and impacted that in place and then used the +6 poly.  We then reduced the shoulder, had nice little  pop and good stability.  We then repaired the subscapularis anatomically back to the lesser tuberosity and then a deltopectoral closure with 0 Vicryl suture after irrigation, 2-0 Vicryl subcutaneous closure, 4-0 Monocryl for skin.  Steri-Strips were  applied followed by a sterile dressing.  The patient tolerated surgery well.  TN/NUANCE  D:03/01/2018 T:03/01/2018 JOB:[id number]

## 2018-03-01 NOTE — H&P (Signed)
Joshua Hudson is an 83 y.o. male.   Chief Complaint: 83 yo male complains of chronic right shoulder pain and dysfunction. Patient reports night pain and interference with ADLs.  He has failed an extended period of conservative management and desires right total shoulder replacement to relieve pain and restore function. Preoperative medical clearance obtained.  HPI: see above  Past Medical History:  Diagnosis Date  . Colon polyps    s/p diverticular perforation requiring 2-stage repair  . Coronary artery disease    a. 20-30% OM2 by cath 2009. b. nonobstructive of all 3 vessels by coronary CTA 2015.  . Diverticulosis   . DJD (degenerative joint disease), lumbosacral   . Esophageal stricture   . GERD (gastroesophageal reflux disease)   . History of kidney stones   . Mitral regurgitation 05/16/2016   mild by echo 05/2016  . Occipital neuralgia   . Pneumonia   . Postoperative atrial fibrillation ([place]) 05/10/2015  . Rheumatoid arthritis ([place])    s/o long term steroids  . Rotator cuff arthropathy    right  . S/P aortic valve replacement with tissue   . SBE (subacute bacterial endocarditis) prophylaxis candidate    for dental procedures  . Severe aortic stenosis    S/P prosthetic valve replacement w 25 mm Edwards like science percardial tissue valve,[place] - 01/2008    Past Surgical History:  Procedure Laterality Date  . APPENDECTOMY    . BOTOX INJECTION N/A 01/22/2018   Procedure: BOTOX INJECTION;  Surgeon: Ronnette Juniper, MD;  Location: [facility];  Service: Gastroenterology;  Laterality: N/A;  . CARDIAC CATHETERIZATION     09  . CARDIAC VALVE REPLACEMENT  01/2008   aortic valve replacement  . CATARACT EXTRACTION W/ INTRAOCULAR LENS  IMPLANT, BILATERAL    . COLON RESECTION     diverticulitis   . ESOPHAGEAL MANOMETRY N/A 11/07/2017   Procedure: ESOPHAGEAL MANOMETRY (EM);  Surgeon: Ronnette Juniper, MD;  Location: [facility];  Service: Gastroenterology;  Laterality: N/A;  .  ESOPHAGOGASTRODUODENOSCOPY (EGD) WITH PROPOFOL N/A 01/22/2018   Procedure: ESOPHAGOGASTRODUODENOSCOPY (EGD) WITH PROPOFOL;  Surgeon: Ronnette Juniper, MD;  Location: [facility];  Service: Gastroenterology;  Laterality: N/A;  . HERNIA REPAIR    . LUMBAR LAMINECTOMY     x 2  . SAVORY DILATION N/A 01/22/2018   Procedure: SAVORY DILATION;  Surgeon: Ronnette Juniper, MD;  Location: [facility];  Service: Gastroenterology;  Laterality: N/A;  . TEE WITHOUT CARDIOVERSION N/A 01/29/2014   Procedure: TRANSESOPHAGEAL ECHOCARDIOGRAM (TEE);  Surgeon: Sueanne Margarita, MD;  Location: [facility] ENDOSCOPY;  Service: Cardiovascular;  Laterality: N/A;    Family History  Problem Relation Age of Onset  . Other Mother        NO HEALTH PROBLEMS  . Heart disease Father   . Arthritis Father   . Heart Problems Sister        RELATED TO A MVA  . Suicidality Brother   . Other Sister        [place]  . Other Brother        2 Powers  . Other Daughter        2 IN GOOD HEALTH   Social History:  reports that he quit smoking about 34 years ago. His smoking use included cigarettes. He started smoking about 44 years ago. He has a 5.00 pack-year smoking history. He has never used smokeless tobacco. He reports that he drank alcohol. He reports that he does not use drugs.  Allergies: No Known Allergies  Medications Prior to Admission  Medication Sig Dispense Refill  . aspirin EC 81 MG EC tablet Take 1 tablet (81 mg total) by mouth daily. 30 tablet   . Cholecalciferol (VITAMIN D3) 1000 units CAPS Take 1,000 Units by mouth daily.     . folic acid (FOLVITE) 1 MG tablet Take 1 mg by mouth daily.    Marland Kitchen KRILL OIL PO Take 750 mg by mouth daily.    Marland Kitchen leflunomide (ARAVA) 20 MG tablet Take 20 mg by mouth daily.    . methotrexate (RHEUMATREX) 2.5 MG tablet Take 25 mg by mouth every Sunday. Caution:Chemotherapy. Protect from light.     . metoprolol succinate (TOPROL-XL) 25 MG 24 hr tablet TAKE ONE TABLET BY MOUTH  ONCE DAILY 90 tablet 3  . Multiple Vitamin (MULTIVITAMIN) tablet Take 1 tablet by mouth daily.    . Naphazoline HCl (CLEAR EYES OP) Place 1 drop into both eyes daily as needed (for dry eyes).     . pantoprazole (PROTONIX) 40 MG tablet Take 40 mg by mouth daily.     . predniSONE (DELTASONE) 5 MG tablet Take 5 mg by mouth daily with breakfast.     . Turmeric 500 MG TABS Take 500 mg by mouth daily.    . vitamin B-12 (CYANOCOBALAMIN) 1000 MCG tablet Take 1,000 mcg by mouth daily.      Results for orders placed or performed during the hospital encounter of 03/01/18 (from the past 48 hour(s))  Basic metabolic panel     Status: None   Collection Time: 03/01/18 10:25 AM  Result Value Ref Range   Sodium 143 135 - 145 mmol/L   Potassium 4.0 3.5 - 5.1 mmol/L   Chloride 108 98 - 111 mmol/L    Comment: Please note change in reference range.   CO2 28 22 - 32 mmol/L   Glucose, Bld 98 70 - 99 mg/dL    Comment: Please note change in reference range.   BUN 10 8 - 23 mg/dL    Comment: Please note change in reference range.   Creatinine, Ser 0.98 0.61 - 1.24 mg/dL   Calcium 9.2 8.9 - 10.3 mg/dL   GFR calc non Af Amer >60 >60 mL/min   GFR calc Af Amer >60 >60 mL/min    Comment: (NOTE) The eGFR has been calculated using the CKD EPI equation. This calculation has not been validated in all clinical situations. eGFR's persistently <60 mL/min signify possible Chronic Kidney Disease.    Anion gap 7 5 - 15    Comment: Performed at [facility] [address], [place], [place] [address]  CBC     Status: Abnormal   Collection Time: 03/01/18 10:25 AM  Result Value Ref Range   WBC 4.3 4.0 - 10.5 K/uL   RBC 4.14 (L) 4.22 - 5.81 MIL/uL   Hemoglobin 8.8 (L) 13.0 - 17.0 g/dL   HCT 28.4 (L) 39.0 - 52.0 %   MCV 68.6 (L) 78.0 - 100.0 fL   MCH 21.3 (L) 26.0 - 34.0 pg   MCHC 31.0 30.0 - 36.0 g/dL   RDW 18.6 (H) 11.5 - 15.5 %   Platelets 140 (L) 150 - 400 K/uL    Comment: Performed at [facility], [place] [address], [place], [place] [address]  Type and screen All Cardiac and thoracic surgeries, spinal fusions, myomectomies, craniotomies, colon & liver resections, total joint revisions, same day c-section with placenta previa or accreta.  Status: None   Collection Time: 03/01/18 10:30 AM  Result Value Ref Range   ABO/RH(D) A POS    Antibody Screen NEG    Sample Expiration      03/04/2018 Performed at [facility], [place] [address] [place],  [address]    No results found.  ROS  Blood pressure 139/73, pulse 71, temperature 98.7 F (37.1 C), temperature source Oral, resp. rate (!) 22, height '5\' 2"'  (1.575 m), weight 46.3 kg (102 lb), SpO2 100 %. Physical Exam  Right shoulder with generalized swelling and tenderness. Decreased AROM and poor mechanics consistent with a rotator cuff insufficient shoulder. Elbow and wrist ROM normal and pain free. No deformity and no swelling Left UE with near normal ROM.Marland Kitchen Some pain elevating above shoulder level. Chest: non tender with normal excursion.  Heart: regular Abdomen scaphoid and soft Bilateral legs non swollen and no pain with AROM   XRAYs: advanced OA right shoulder with some superior head migration  Assessment/Plan End staged right shoulder OA with RC insufficiency I discussed options for treatment of the chronic shoulder pain and dysfunction including pain management and modification of activity vs surgery with Reverse TSA.  Patient and family elected to proceed with surgery as he is in so much pain on a daily basis.  [name], MD 03/01/2018, 1:07 PM

## 2018-03-01 NOTE — Anesthesia Postprocedure Evaluation (Signed)
Anesthesia Post Note  Patient: [name]  Procedure(s) Performed: RIGHT REVERSE SHOULDER ARTHROPLASTY (Right Shoulder)     Patient location during evaluation: PACU Anesthesia Type: General Level of consciousness: awake and alert Pain management: pain level controlled Vital Signs Assessment: post-procedure vital signs reviewed and stable Respiratory status: spontaneous breathing, nonlabored ventilation, respiratory function stable and patient connected to nasal cannula oxygen Cardiovascular status: blood pressure returned to baseline and stable Postop Assessment: no apparent nausea or vomiting Anesthetic complications: no    Last Vitals:  Vitals:   03/01/18 1655 03/01/18 1711  BP: 128/70 126/72  Pulse: (!) 53 (!) 54  Resp: (!) 23 19  Temp:    SpO2: 100% 100%    Last Pain:  Vitals:   03/01/18 1655  TempSrc:   PainSc: 0-No pain                 Effie Berkshire

## 2018-03-01 NOTE — Discharge Instructions (Signed)
Ice to the shoulder as much as you can.  Keep the incision clean and dry and covered for one week, then ok to get it wet in the shower.   Do exercises 4 times per day as instructed.  Do not push pull or lift with the right arm.  Ok to use the arm for light daily activity and for balance.  Use the arm sling as needed to support the arm.  Follow up in two weeks in the office, call [phone]

## 2018-03-01 NOTE — Anesthesia Preprocedure Evaluation (Addendum)
Anesthesia Evaluation  Patient identified by MRN, date of birth, ID band Patient awake    Reviewed: Allergy & Precautions, NPO status , Patient's Chart, lab work & pertinent test results  Airway Mallampati: II  TM Distance: >3 FB Neck ROM: Full    Dental no notable dental hx.    Pulmonary former smoker,    Pulmonary exam normal breath sounds clear to auscultation       Cardiovascular + CAD  Normal cardiovascular exam+ dysrhythmias Atrial Fibrillation  Rhythm:Regular Rate:Normal  S/p AVR 2009   Neuro/Psych    GI/Hepatic negative GI ROS,   Endo/Other    Renal/GU      Musculoskeletal  (+) Arthritis , Rheumatoid disorders and steroids,    Abdominal   Peds  Hematology   Anesthesia Other Findings   Reproductive/Obstetrics negative OB ROS                            Anesthesia Physical  Anesthesia Plan  ASA: III  Anesthesia Plan: General   Post-op Pain Management:  Regional for Post-op pain   Induction:   PONV Risk Score and Plan: 1 and Ondansetron and Treatment may vary due to age or medical condition  Airway Management Planned: Oral ETT  Additional Equipment:   Intra-op Plan:   Post-operative Plan:   Informed Consent: I have reviewed the patients History and Physical, chart, labs and discussed the procedure including the risks, benefits and alternatives for the proposed anesthesia with the patient or authorized representative who has indicated his/her understanding and acceptance.   Dental advisory given  Plan Discussed with: CRNA, Anesthesiologist and Surgeon  Anesthesia Plan Comments:        Anesthesia Quick Evaluation

## 2018-03-01 NOTE — Brief Op Note (Signed)
03/01/2018  4:02 PM  PATIENT:  Joshua Hudson  83 y.o. male  PRE-OPERATIVE DIAGNOSIS:  right shoulder osteoarthritis; rotator cuff unsufficiency  POST-OPERATIVE DIAGNOSIS:  right shoulder osteoarthritis; rotator cuff unsufficiency  PROCEDURE:  Procedure(s): RIGHT REVERSE SHOULDER ARTHROPLASTY (Right) Tornier Aequalis Reverse shoulder replacement  SURGEON:  Surgeon(s) and Role:    Netta Cedars, MD - Primary  PHYSICIAN ASSISTANT:   ASSISTANTS: Ventura Bruns, PA-C   ANESTHESIA:   regional and general  EBL:  300 mL   BLOOD ADMINISTERED: 2 units PRBCs  DRAINS: none   LOCAL MEDICATIONS USED:  MARCAINE     SPECIMEN:  No Specimen  DISPOSITION OF SPECIMEN:  N/A  COUNTS:  YES  TOURNIQUET:  * No tourniquets in log *  DICTATION: .Other Dictation: Dictation Number [id number]  PLAN OF CARE: Admit to inpatient   PATIENT DISPOSITION:  PACU - hemodynamically stable.   Delay start of Pharmacological VTE agent (>24hrs) due to surgical blood loss or risk of bleeding: not applicable

## 2018-03-01 NOTE — Anesthesia Procedure Notes (Signed)
Anesthesia Regional Block: Interscalene brachial plexus block   Pre-Anesthetic Checklist: ,, timeout performed, Correct Patient, Correct Site, Correct Laterality, Correct Procedure, Correct Position, site marked, Risks and benefits discussed,  Surgical consent,  Pre-op evaluation,  At surgeon's request and post-op pain management  Laterality: Right  Prep: chloraprep       Needles:  Injection technique: Single-shot  Needle Type: Echogenic Stimulator Needle     Needle Length: 5cm  Needle Gauge: 22     Additional Needles:   Procedures:, nerve stimulator,,, ultrasound used (permanent image in chart),,,,   Nerve Stimulator or Paresthesia:  Response: deltoid, 0.45 mA,   Additional Responses:   Narrative:  Start time: 03/01/2018 12:30 PM End time: 03/01/2018 12:35 PM Injection made incrementally with aspirations every 5 mL.  Performed by: Personally  Anesthesiologist: Janeece Riggers, MD  Additional Notes: Functioning IV was confirmed and monitors were applied.  A 25mm 22ga Arrow echogenic stimulator needle was used. Sterile prep and drape,hand hygiene and sterile gloves were used. Ultrasound guidance: relevant anatomy identified, needle position confirmed, local anesthetic spread visualized around nerve(s)., vascular puncture avoided.  Image printed for medical record. Negative aspiration and negative test dose prior to incremental administration of local anesthetic. The patient tolerated the procedure well.

## 2018-03-01 NOTE — Anesthesia Procedure Notes (Signed)
Procedure Name: Intubation Date/Time: 03/01/2018 1:37 PM Performed by: Janeece Riggers, MD Pre-anesthesia Checklist: Patient identified, Emergency Drugs available, Suction available and Patient being monitored Patient Re-evaluated:Patient Re-evaluated prior to induction Oxygen Delivery Method: Circle System Utilized Preoxygenation: Pre-oxygenation with 100% oxygen Induction Type: IV induction Ventilation: Mask ventilation without difficulty Laryngoscope Size: Mac and 4 Grade View: Grade I Tube type: Oral Tube size: 7.5 mm Number of attempts: 1 Airway Equipment and Method: Stylet and Oral airway Placement Confirmation: ETT inserted through vocal cords under direct vision,  positive ETCO2 and breath sounds checked- equal and bilateral Secured at: 23 cm Tube secured with: Tape Dental Injury: Teeth and Oropharynx as per pre-operative assessment  Comments: Placed by Katharina Caper under supervision of MD and CRNA

## 2018-03-02 LAB — BPAM RBC
BLOOD PRODUCT EXPIRATION DATE: 201907232359
Blood Product Expiration Date: 201907232359
ISSUE DATE / TIME: 201906281431
ISSUE DATE / TIME: 201906281431
UNIT TYPE AND RH: 6200
Unit Type and Rh: 6200

## 2018-03-02 LAB — BASIC METABOLIC PANEL
Anion gap: 8 (ref 5–15)
BUN: 10 mg/dL (ref 8–23)
CALCIUM: 8.8 mg/dL — AB (ref 8.9–10.3)
CHLORIDE: 108 mmol/L (ref 98–111)
CO2: 26 mmol/L (ref 22–32)
CREATININE: 0.96 mg/dL (ref 0.61–1.24)
GFR calc Af Amer: >60 mL/min (ref >60)
GFR calc non Af Amer: >60 mL/min (ref >60)
GLUCOSE: 85 mg/dL (ref 70–99)
POTASSIUM: 3.7 mmol/L (ref 3.5–5.1)
Sodium: 142 mmol/L (ref 135–145)

## 2018-03-02 LAB — TYPE AND SCREEN
ABO/RH(D): A POS
ANTIBODY SCREEN: NEGATIVE
Unit division: 0
Unit division: 0

## 2018-03-02 LAB — HEMOGLOBIN AND HEMATOCRIT, BLOOD
HEMATOCRIT: 33.8 % — AB (ref 39.0–52.0)
Hemoglobin: 10.8 g/dL — ABNORMAL LOW (ref 13.0–17.0)

## 2018-03-02 NOTE — Progress Notes (Signed)
Subjective: 1 Day Post-Op Procedure(s) (LRB): RIGHT REVERSE SHOULDER ARTHROPLASTY (Right)  Patient reports pain as mild to moderate.  Tolerating POs well. Admits to flatus.  Denies fever, chills, N/V, CP, SOB.  Reports that he wants to go home.  Denies dizziness or light headedness.  Accompanied by family members.  Objective:   VITALS:  Temp:  [97.2 F (36.2 C)-98.5 F (36.9 C)] 98 F (36.7 C) (06/29 1009) Pulse Rate:  [53-71] 64 (06/29 1009) Resp:  [16-25] 18 (06/29 1009) BP: (106-146)/(61-75) 116/68 (06/29 1009) SpO2:  [99 %-100 %] 100 % (06/29 1009)  General: WDWN patient in NAD. Psych:  Appropriate mood and affect. Neuro:  A&O x 3, Moving all extremities, sensation intact to light touch HEENT:  EOMs intact Chest:  Even non-labored respirations Skin:  Dressing/sling C/D/I, no rashes or lesions Extremities: warm/dry, mild edema, no erythema or echymosis.  No lymphadenopathy. Pulses: Radial 2+ MSK:  ROM: Full wrist ROM, MMT: 5/5 grip strength     LABS Recent Labs    03/01/18 1025 03/02/18 0427  HGB 8.8* 10.8*  WBC 4.3  --   PLT 140*  --    Recent Labs    03/01/18 1025 03/02/18 0427  NA 143 142  K 4.0 3.7  CL 108 108  CO2 28 26  BUN 10 10  CREATININE 0.98 0.96  GLUCOSE 98 85   No results for input(s): LABPT, INR in the last 72 hours.   Assessment/Plan: 1 Day Post-Op Procedure(s) (LRB): RIGHT REVERSE SHOULDER ARTHROPLASTY (Right)  Patient seen in rounds for Dr. Veverly Fells Sling prn R UE HEP per Dr. Veverly Fells recommendations. Hgb 10.8 D/C home today. Plan for outpatient post-op visit with Dr. Veverly Fells Scripts on chart   Mechele Claude PA-C [facility] Office:  [phone]

## 2018-03-02 NOTE — Progress Notes (Signed)
Discharge instructions discussed with pt and family at bedside. All verbalized understanding of medication administration, follow up appointments, and signs of infection. All verbalized when to return to the hospital or to call MD.

## 2018-03-02 NOTE — Progress Notes (Signed)
Occupational Therapy Evaluation and Discharge (Late Note Entry) Patient Details Name: Joshua Hudson MRN: [id number] DOB: 05-11-1935 Today's Date: 03/02/2018   Clinical Impression: PTA Pt independent in ADL and mobility. Pt is currently mod to max A for UB ADL. Shoulder handout provided and reviewed in full including exercises as order by MD. Daughters present throughout session and Pt verbalized and demonstrated understanding. Pt and daughters with no questions at the end of session, education complete. Thank you for the opportunity to serve this patient and his family. Pt at adequate level for DC from OT standpoint.     03/02/18 0900  OT Visit Information  Last OT Received On 03/02/18  Assistance Needed +1  History of Present Illness Pt is an 83 y/o male s/p R reverse shoulder replacement.  Pt has a PMH including CAD, Diverticulosis, DJD, lumbosacral, Esophageal stricture, GERD, Mitral regurgitation (05/16/2016), Occipital neuralgia, Pneumonia, Postoperative atrial fibrillation (05/10/2015), Rheumatoid arthritis, Rotator cuff arthropathy, S/P aortic valve replacement with tissue, SBE, and Severe aortic stenosis.  Precautions  Precautions Shoulder  Type of Shoulder Precautions conservative - AROM for ADL  Shoulder Interventions Shoulder sling/immobilizer;Off for dressing/bathing/exercises  Precaution Booklet Issued Yes (comment)  Precaution Comments Ok to gentle ADLs, pendulums, lap slides, and hand to face active and active assist.  Ok to use the right arm for balance but not any real weight bearing   Required Braces or Orthoses Sling  Restrictions  Weight Bearing Restrictions Yes  RUE Weight Bearing NWB  Other Position/Activity Restrictions WB for balance only  Home Living  Family/patient expects to be discharged to: Private residence  Living Arrangements Spouse/significant other  Available Help at Discharge Family;Available 24 hours/day  Type of Home House  Home Access Stairs to enter   Entrance Stairs-Number of Steps 3  Entrance Stairs-Rails Right  Home Layout One level  Bathroom Shower/Tub Tub/shower unit  Bathroom Toilet Handicapped height  Home Equipment None  Prior Function  Level of Independence Independent  Communication  Communication No difficulties  Pain Assessment  Pain Assessment 0-10  Pain Score 5  Pain Location R shoulder  Pain Descriptors / Indicators Discomfort;Sore  Pain Intervention(s) Monitored during session (offered ice)  Cognition  Arousal/Alertness Awake/alert  Behavior During Therapy WFL for tasks assessed/performed  Overall Cognitive Status Within Functional Limits for tasks assessed  Upper Extremity Assessment  Upper Extremity Assessment RUE deficits/detail  RUE Deficits / Details anticipated deficits post-op  RUE Unable to fully assess due to immobilization  RUE Sensation WNL  RUE Coordination decreased gross motor  Lower Extremity Assessment  Lower Extremity Assessment Overall WFL for tasks assessed  ADL  General ADL Comments please see shoulder section below  Vision- History  Baseline Vision/History No visual deficits  Patient Visual Report No change from baseline  Vision- Assessment  Vision Assessment? No apparent visual deficits  Bed Mobility  General bed mobility comments Pt OOB in recliner when OT entered  Transfers  Overall transfer level Modified independent  Equipment used None  General transfer comment sit to stand, no attempt to push/pull with RUE  Balance  Overall balance assessment Mild deficits observed, not formally tested  General Comments  General comments (skin integrity, edema, etc.) 2 daughters present throughout session and received all education  Exercises  Exercises Shoulder  Shoulder Instructions  Donning/doffing shirt without moving shoulder Maximal assistance;Caregiver independent with task;Patient able to independently direct caregiver  Method for sponge bathing under operated UE Modified  independent  Donning/doffing sling/immobilizer Moderate assistance;Caregiver independent with task;Patient able to independently  direct caregiver  Correct positioning of sling/immobilizer Moderate assistance;Caregiver independent with task;Patient able to independently direct caregiver  Pendulum exercises (written home exercise program) Modified independent  ROM for elbow, wrist and digits of operated UE Modified independent  Sling wearing schedule (on at all times/off for ADL's) Modified independent  Proper positioning of operated UE when showering Supervision/safety  Positioning of UE while sleeping Modified independent  Shoulder Exercises  Pendulum Exercise Right;10 reps;Seated;Standing  Elbow Flexion AROM;Right;10 reps;Seated  Elbow Extension AROM;Right;10 reps;Seated  Wrist Flexion AROM;Right  Wrist Extension AROM;Right  Digit Composite Flexion AROM;Right  Neck Flexion AROM  Neck Extension AROM  Neck Lateral Flexion - Right AROM  Neck Lateral Flexion - Left AROM  OT - End of Session  Equipment Utilized During Treatment Gait belt;Other (comment) (sling)  Activity Tolerance Patient tolerated treatment well  Patient left in chair;with call bell/phone within reach;with family/visitor present  Nurse Communication Mobility status  OT Assessment  OT Recommendation/Assessment Progress rehab of shoulder as ordered by MD at follow-up appointment  OT Visit Diagnosis Pain  Pain - Right/Left Right  Pain - part of body Shoulder  OT Problem List Decreased range of motion;Impaired UE functional use;Pain  AM-PAC OT "6 Clicks" Daily Activity Outcome Measure  Help from another person eating meals? 3  Help from another person taking care of personal grooming? 3  Help from another person toileting, which includes using toliet, bedpan, or urinal? 3  Help from another person bathing (including washing, rinsing, drying)? 3  Help from another person to put on and taking off regular upper body  clothing? 2  Help from another person to put on and taking off regular lower body clothing? 3  6 Click Score 17  ADL G Code Conversion CK  OT Recommendation  Follow Up Recommendations Follow surgeon's recommendation for DC plan and follow-up therapies  OT Equipment None recommended by OT  Acute Rehab OT Goals  Patient Stated Goal to get home  OT Goal Formulation With patient/family  Time For Goal Achievement 03/15/18  Potential to Achieve Goals Good  OT Time Calculation  OT Start Time (ACUTE ONLY) 0923  OT Stop Time (ACUTE ONLY) 0952  OT Time Calculation (min) 29 min  OT General Charges  $OT Visit 1 Visit  OT Evaluation  $OT Eval Moderate Complexity 1 Mod  OT Treatments  $Self Care/Home Management  8-22 mins  Written Expression  Dominant Hand Right   Hulda Humphrey OTR/L [phone]

## 2018-03-05 ENCOUNTER — Encounter (HOSPITAL_COMMUNITY): Payer: Self-pay | Admitting: Orthopedic Surgery

## 2018-03-28 NOTE — Discharge Summary (Signed)
Orthopedic Discharge Summary        Physician Discharge Summary  Patient ID: Joshua Hudson MRN: [id number] DOB/AGE: [birth year] 83 y.o.  Admit date: 03/01/2018 Discharge date: 03/02/18  Procedures:  Procedure(s) (LRB): RIGHT REVERSE SHOULDER ARTHROPLASTY (Right)  Attending Physician:  Dr. Esmond Plants  Admission Diagnoses:   Right shoulder cuff arthropathy   Discharge Diagnoses:  Right shoulder cuff arthropathy   Past Medical History:  Diagnosis Date  . Colon polyps    s/p diverticular perforation requiring 2-stage repair  . Coronary artery disease    a. 20-30% OM2 by cath 2009. b. nonobstructive of all 3 vessels by coronary CTA 2015.  . Diverticulosis   . DJD (degenerative joint disease), lumbosacral   . Esophageal stricture   . GERD (gastroesophageal reflux disease)   . History of kidney stones   . Mitral regurgitation 05/16/2016   mild by echo 05/2016  . Occipital neuralgia   . Pneumonia   . Postoperative atrial fibrillation ([place]) 05/10/2015  . Rheumatoid arthritis ([place])    s/o long term steroids  . Rotator cuff arthropathy    right  . S/P aortic valve replacement with tissue   . SBE (subacute bacterial endocarditis) prophylaxis candidate    for dental procedures  . Severe aortic stenosis    S/P prosthetic valve replacement w 25 mm Edwards like science percardial tissue valve,[place] - 01/2008    PCP: Lavone Orn, MD   Discharged Condition: good  Hospital Course:  Patient underwent the above stated procedure on 03/01/2018. Patient tolerated the procedure well and brought to the recovery room in good condition and subsequently to the floor. Patient had an uncomplicated hospital course and was stable for discharge.   Disposition:  with follow up in 2 weeks   Follow-up Information    Netta Cedars, MD. Call in 2 weeks.   Specialty:  Orthopedic Surgery Why:  [phone] Contact information: [address] [place]  [address] [phone]           Discharge Instructions    Call MD / Call 911   Complete by:  As directed    If you experience chest pain or shortness of breath, CALL 911 and be transported to the hospital emergency room.  If you develope a fever above 101 F, pus (white drainage) or increased drainage or redness at the wound, or calf pain, call your surgeon's office.   Constipation Prevention   Complete by:  As directed    Drink plenty of fluids.  Prune juice may be helpful.  You may use a stool softener, such as Colace (over the counter) 100 mg twice a day.  Use MiraLax (over the counter) for constipation as needed.   Diet - low sodium heart healthy   Complete by:  As directed    Increase activity slowly as tolerated   Complete by:  As directed       Allergies as of 03/02/2018   No Known Allergies     Medication List    TAKE these medications   aspirin 81 MG EC tablet Take 1 tablet (81 mg total) by mouth daily.   CLEAR EYES OP Place 1 drop into both eyes daily as needed (for dry eyes).   folic acid 1 MG tablet Commonly known as:  FOLVITE Take 1 mg by mouth daily.   HYDROcodone-acetaminophen 5-325 MG tablet Commonly known as:  NORCO Take 0.5-1 tablets by mouth every 4 (four) hours as needed for moderate pain.  KRILL OIL PO Take 750 mg by mouth daily.   leflunomide 20 MG tablet Commonly known as:  ARAVA Take 20 mg by mouth daily.   methotrexate 2.5 MG tablet Commonly known as:  RHEUMATREX Take 25 mg by mouth every Sunday. Caution:Chemotherapy. Protect from light.   metoprolol succinate 25 MG 24 hr tablet Commonly known as:  TOPROL-XL TAKE ONE TABLET BY MOUTH ONCE DAILY   multivitamin tablet Take 1 tablet by mouth daily.   pantoprazole 40 MG tablet Commonly known as:  PROTONIX Take 40 mg by mouth daily.   predniSONE 5 MG tablet Commonly known as:  DELTASONE Take 5 mg by mouth daily with breakfast.   Turmeric 500 MG Tabs Take 500 mg by mouth daily.    vitamin B-12 1000 MCG tablet Commonly known as:  CYANOCOBALAMIN Take 1,000 mcg by mouth daily.   Vitamin D3 1000 units Caps Take 1,000 Units by mouth daily.         Signed: Ventura Bruns 03/28/2018, 1:27 PM  [facility] is now [facility] [address] [place], [place], [place] [address] Phone: [place]

## 2018-06-06 ENCOUNTER — Other Ambulatory Visit: Payer: Self-pay | Admitting: Cardiology

## 2018-06-11 NOTE — Pre-Procedure Instructions (Signed)
Joshua Hudson  06/11/2018      [place], [place] [address] [place] [place] [address] Phone: [phone] Fax: [phone]    Your procedure is scheduled on Friday October 11.  [facility]  Call this number if you have problems the morning of surgery:  [phone]   Remember:  Do not eat or drink after midnight.    Take these medicines the morning of surgery with A SIP OF WATER:   Metoprolol (Toprol-XL) Pantoprazole (Protonix) Prednisone (Deltasone) Eye drops  7 days prior to surgery STOP taking any Aleve, Naproxen, Ibuprofen, Motrin, Advil, Goody's, BC's, all herbal medications, fish oil, and all vitamins  FOLLOW your surgeon's instructions on stopping Aspirin. If no instructions were given, please call your surgeon's office.      Do not wear jewelry, make-up or nail polish.  Do not wear lotions, powders, or perfumes, or deodorant.  Do not shave 48 hours prior to surgery.  Men may shave face and neck.  Do not bring valuables to the hospital.  [facility] is not responsible for any belongings or valuables.  Contacts, dentures or bridgework may not be worn into surgery.  Leave your suitcase in the car.  After surgery it may be brought to your room.  For patients admitted to the hospital, discharge time will be determined by your treatment team.  Patients discharged the day of surgery will not be allowed to drive home.   Special instructions:    [facility]- Preparing For Surgery  Before surgery, you can play an important role. Because skin is not sterile, your skin needs to be as free of germs as possible. You can reduce the number of germs on your skin by washing with CHG (chlorahexidine gluconate) Soap before surgery.  CHG is an antiseptic cleaner which kills germs and bonds with the skin to continue killing germs even after washing.    Oral Hygiene is also  important to reduce your risk of infection.  Remember - BRUSH YOUR TEETH THE MORNING OF SURGERY WITH YOUR REGULAR TOOTHPASTE  Please do not use if you have an allergy to CHG or antibacterial soaps. If your skin becomes reddened/irritated stop using the CHG.  Do not shave (including legs and underarms) for at least 48 hours prior to first CHG shower. It is OK to shave your face.  Please follow these instructions carefully.   1. Shower the NIGHT BEFORE SURGERY and the MORNING OF SURGERY with CHG.   2. If you chose to wash your hair, wash your hair first as usual with your normal shampoo.  3. After you shampoo, rinse your hair and body thoroughly to remove the shampoo.  4. Use CHG as you would any other liquid soap. You can apply CHG directly to the skin and wash gently with a scrungie or a clean washcloth.   5. Apply the CHG Soap to your body ONLY FROM THE NECK DOWN.  Do not use on open wounds or open sores. Avoid contact with your eyes, ears, mouth and genitals (private parts). Wash Face and genitals (private parts)  with your normal soap.  6. Wash thoroughly, paying special attention to the area where your surgery will be performed.  7. Thoroughly rinse your body with warm water from the neck down.  8. DO NOT shower/wash with your normal soap after using and rinsing off the CHG Soap.  9. Pat yourself dry  with a CLEAN TOWEL.  10. Wear CLEAN PAJAMAS to bed the night before surgery, wear comfortable clothes the morning of surgery  11. Place CLEAN SHEETS on your bed the night of your first shower and DO NOT SLEEP WITH PETS.    Day of Surgery:  Do not apply any deodorants/lotions.  Please wear clean clothes to the hospital/surgery center.   Remember to brush your teeth WITH YOUR REGULAR TOOTHPASTE.    Please read over the following fact sheets that you were given. Coughing and Deep Breathing and Surgical Site Infection Prevention

## 2018-06-12 ENCOUNTER — Encounter (HOSPITAL_COMMUNITY): Payer: Self-pay

## 2018-06-12 ENCOUNTER — Other Ambulatory Visit: Payer: Self-pay

## 2018-06-12 ENCOUNTER — Encounter (HOSPITAL_COMMUNITY)
Admission: RE | Admit: 2018-06-12 | Discharge: 2018-06-12 | Disposition: A | Discharge status: Home / Self Care | Payer: Medicare Other | Source: Ambulatory Visit | Attending: Orthopedic Surgery | Admitting: Orthopedic Surgery

## 2018-06-12 DIAGNOSIS — I340 Nonrheumatic mitral (valve) insufficiency: Secondary | ICD-10-CM | POA: Diagnosis not present

## 2018-06-12 DIAGNOSIS — Z01812 Encounter for preprocedural laboratory examination: Secondary | ICD-10-CM | POA: Diagnosis present

## 2018-06-12 DIAGNOSIS — I2510 Atherosclerotic heart disease of native coronary artery without angina pectoris: Secondary | ICD-10-CM | POA: Insufficient documentation

## 2018-06-12 DIAGNOSIS — D649 Anemia, unspecified: Secondary | ICD-10-CM | POA: Insufficient documentation

## 2018-06-12 DIAGNOSIS — I9789 Other postprocedural complications and disorders of the circulatory system, not elsewhere classified: Secondary | ICD-10-CM | POA: Insufficient documentation

## 2018-06-12 DIAGNOSIS — Z952 Presence of prosthetic heart valve: Secondary | ICD-10-CM | POA: Insufficient documentation

## 2018-06-12 HISTORY — DX: Anemia, unspecified: D64.9

## 2018-06-12 LAB — CBC
HCT: 24.8 % — ABNORMAL LOW (ref 39.0–52.0)
Hemoglobin: 7.4 g/dL — ABNORMAL LOW (ref 13.0–17.0)
MCH: 21.1 pg — ABNORMAL LOW (ref 26.0–34.0)
MCHC: 29.8 g/dL — AB (ref 30.0–36.0)
MCV: 70.7 fL — ABNORMAL LOW (ref 80.0–100.0)
Platelets: 105 10*3/uL — ABNORMAL LOW (ref 150–400)
RBC: 3.51 MIL/uL — ABNORMAL LOW (ref 4.22–5.81)
RDW: 17.8 % — ABNORMAL HIGH (ref 11.5–15.5)
WBC: 3.2 10*3/uL — AB (ref 4.0–10.5)
nRBC: 0.6 % — ABNORMAL HIGH (ref 0.0–0.2)

## 2018-06-12 LAB — BASIC METABOLIC PANEL
Anion gap: 8 (ref 5–15)
BUN: 15 mg/dL (ref 8–23)
CALCIUM: 9.1 mg/dL (ref 8.9–10.3)
CO2: 26 mmol/L (ref 22–32)
CREATININE: 0.88 mg/dL (ref 0.61–1.24)
Chloride: 104 mmol/L (ref 98–111)
GFR calc Af Amer: >60 mL/min (ref >60)
Glucose, Bld: 90 mg/dL (ref 70–99)
Potassium: 5.1 mmol/L (ref 3.5–5.1)
SODIUM: 138 mmol/L (ref 135–145)

## 2018-06-12 LAB — SURGICAL PCR SCREEN
MRSA, PCR: NEGATIVE
Staphylococcus aureus: NEGATIVE

## 2018-06-12 NOTE — Progress Notes (Addendum)
PCP: Lavone Orn  Cardiologist: [name]  DM: denies  Pt currently taking 81mg  ASA. Instructed pt to contact Dr. Gilberto Better office for further instruction regarding when to stop medication.  As of PAT appointment, no orders were available.  Per Ailene Ravel, office was called and spoke to [place].  Pt denies SOB, cough, fever, or chest pain.  Pt stated understanding of instructions given for day of surgery.

## 2018-06-12 NOTE — Progress Notes (Signed)
Blood work taken during PAT appointment came back abnormal.  Noted Hgb of 7.4. Dr. Veverly Fells notified via message.  Chart being sent to anesthesia for review.

## 2018-06-13 MED ORDER — TRANEXAMIC ACID-NACL 1000-0.7 MG/100ML-% IV SOLN
1000.0000 mg | INTRAVENOUS | Status: AC
  Administered 2018-06-14: 1000 mg via INTRAVENOUS
  Filled 2018-06-13: qty 100

## 2018-06-13 NOTE — H&P (Signed)
Patient's anticipated LOS is less than 2 midnights, meeting these requirements: - Younger than 46 - Lives within 1 hour of care - Has a competent adult at home to recover with post-op recover - NO history of  - Chronic pain requiring opiods  - Diabetes  - Coronary Artery Disease  - Heart failure  - Heart attack  - Stroke  - DVT/VTE  - Cardiac arrhythmia  - Respiratory Failure/COPD  - Renal failure  - Anemia  - Advanced Liver disease       Joshua Hudson is an 83 y.o. male.    Chief Complaint: right shoulder pain  HPI: Pt is a 83 y.o. male complaining of right shoulder pain for multiple years. Pain had continually increased since the beginning. X-rays in the clinic dislocation of right reverse total shoulder. Various options are discussed with the patient. Risks, benefits and expectations were discussed with the patient. Patient understand the risks, benefits and expectations and wishes to proceed with surgery.   PCP:  Lavone Orn, MD  D/C Plans: Home  PMH: Past Medical History:  Diagnosis Date  . Anemia    Previous history of anemia  . Colon polyps    s/p diverticular perforation requiring 2-stage repair  . Coronary artery disease    a. 20-30% OM2 by cath 2009. b. nonobstructive of all 3 vessels by coronary CTA 2015.  . Diverticulosis   . DJD (degenerative joint disease), lumbosacral   . Esophageal stricture   . GERD (gastroesophageal reflux disease)   . History of kidney stones   . Mitral regurgitation 05/16/2016   mild by echo 05/2016  . Occipital neuralgia   . Pneumonia   . Postoperative atrial fibrillation ([place]) 05/10/2015  . Rheumatoid arthritis ([place])    s/o long term steroids  . Rotator cuff arthropathy    right  . S/P aortic valve replacement with tissue   . SBE (subacute bacterial endocarditis) prophylaxis candidate    for dental procedures  . Severe aortic stenosis    S/P prosthetic valve replacement w 25 mm Edwards like science percardial tissue  valve,Turner - 01/2008    PSH: Past Surgical History:  Procedure Laterality Date  . APPENDECTOMY    . BOTOX INJECTION N/A 01/22/2018   Procedure: BOTOX INJECTION;  Surgeon: Ronnette Juniper, MD;  [facility];  Service: Gastroenterology;  Laterality: N/A;  . CARDIAC CATHETERIZATION     09  . CARDIAC VALVE REPLACEMENT  01/2008   aortic valve replacement  . CATARACT EXTRACTION W/ INTRAOCULAR LENS  IMPLANT, BILATERAL    . COLON RESECTION     diverticulitis   . ESOPHAGEAL MANOMETRY N/A 11/07/2017   Procedure: ESOPHAGEAL MANOMETRY (EM);  Surgeon: Ronnette Juniper, MD;  [facility];  Service: Gastroenterology;  Laterality: N/A;  . ESOPHAGOGASTRODUODENOSCOPY (EGD) WITH PROPOFOL N/A 01/22/2018   Procedure: ESOPHAGOGASTRODUODENOSCOPY (EGD) WITH PROPOFOL;  Surgeon: Ronnette Juniper, MD;  [facility];  Service: Gastroenterology;  Laterality: N/A;  . HERNIA REPAIR    . LUMBAR LAMINECTOMY     x 2  . REVERSE SHOULDER ARTHROPLASTY Right 03/01/2018   Procedure: RIGHT REVERSE SHOULDER ARTHROPLASTY;  Surgeon: Netta Cedars, MD;  Location: [facility];  Service: Orthopedics;  Laterality: Right;  . SAVORY DILATION N/A 01/22/2018   Procedure: SAVORY DILATION;  Surgeon: Ronnette Juniper, MD;  [facility];  Service: Gastroenterology;  Laterality: N/A;  . TEE WITHOUT CARDIOVERSION N/A 01/29/2014   Procedure: TRANSESOPHAGEAL ECHOCARDIOGRAM (TEE);  Surgeon: Sueanne Margarita, MD;  Location: [place];  Service:  Cardiovascular;  Laterality: N/A;    Social History:  reports that he quit smoking about 34 years ago. His smoking use included cigarettes. He started smoking about 44 years ago. He has a 5.00 pack-year smoking history. He has quit using smokeless tobacco.  His smokeless tobacco use included chew. He reports that he drank alcohol. He reports that he does not use drugs.  Allergies:  No Known Allergies  Medications: No current facility-administered medications for this encounter.    Current  Outpatient Medications  Medication Sig Dispense Refill  . aspirin EC 81 MG EC tablet Take 1 tablet (81 mg total) by mouth daily. 30 tablet   . Cholecalciferol (VITAMIN D3) 1000 units CAPS Take 1,000 Units by mouth daily.     . ferrous gluconate (IRON 27) 240 (27 FE) MG tablet Take 240 mg by mouth daily.    . folic acid (FOLVITE) 1 MG tablet Take 1 mg by mouth daily.    Marland Kitchen KRILL OIL PO Take 750 mg by mouth daily.    Marland Kitchen leflunomide (ARAVA) 20 MG tablet Take 20 mg by mouth daily.    . methotrexate (RHEUMATREX) 2.5 MG tablet Take 25 mg by mouth every Sunday. Caution:Chemotherapy. Protect from light.     . metoprolol succinate (TOPROL-XL) 25 MG 24 hr tablet TAKE 1 TABLET BY MOUTH ONCE DAILY (Patient taking differently: Take 25 mg by mouth daily. ) 90 tablet 2  . Multiple Vitamin (MULTIVITAMIN) tablet Take 1 tablet by mouth daily.    . Naphazoline HCl (CLEAR EYES OP) Place 1 drop into both eyes daily as needed (for dry eyes).     . pantoprazole (PROTONIX) 40 MG tablet Take 40 mg by mouth daily.     . predniSONE (DELTASONE) 5 MG tablet Take 5 mg by mouth daily with breakfast.     . vitamin B-12 (CYANOCOBALAMIN) 1000 MCG tablet Take 1,000 mcg by mouth daily.    Marland Kitchen HYDROcodone-acetaminophen (NORCO) 5-325 MG tablet Take 0.5-1 tablets by mouth every 4 (four) hours as needed for moderate pain. (Patient not taking: Reported on 06/07/2018) 30 tablet 0    Results for orders placed or performed during the hospital encounter of 06/12/18 (from the past 48 hour(s))  Surgical pcr screen     Status: None   Collection Time: 06/12/18 10:59 AM  Result Value Ref Range   MRSA, PCR NEGATIVE NEGATIVE   Staphylococcus aureus NEGATIVE NEGATIVE    Comment: (NOTE) The Xpert SA Assay (FDA approved for NASAL specimens in patients 90 years of age and older), is one component of a comprehensive surveillance program. It is not intended to diagnose infection nor to guide or monitor treatment. Performed at [facility], [place] [address], [place],  [address]   Basic metabolic panel     Status: None   Collection Time: 06/12/18 10:59 AM  Result Value Ref Range   Sodium 138 135 - 145 mmol/L   Potassium 5.1 3.5 - 5.1 mmol/L    Comment: HEMOLYSIS AT THIS LEVEL MAY AFFECT RESULT   Chloride 104 98 - 111 mmol/L   CO2 26 22 - 32 mmol/L   Glucose, Bld 90 70 - 99 mg/dL   BUN 15 8 - 23 mg/dL   Creatinine, Ser 0.88 0.61 - 1.24 mg/dL   Calcium 9.1 8.9 - 10.3 mg/dL   GFR calc non Af Amer >60 >60 mL/min   GFR calc Af Amer >60 >60 mL/min    Comment: (NOTE) The eGFR has been calculated using  the CKD EPI equation. This calculation has not been validated in all clinical situations. eGFR's persistently <60 mL/min signify possible Chronic Kidney Disease.    Anion gap 8 5 - 15    Comment: Performed at [facility] [address], [place], [place] [address]  CBC     Status: Abnormal   Collection Time: 06/12/18 10:59 AM  Result Value Ref Range   WBC 3.2 (L) 4.0 - 10.5 K/uL   RBC 3.51 (L) 4.22 - 5.81 MIL/uL   Hemoglobin 7.4 (L) 13.0 - 17.0 g/dL    Comment: Reticulocyte Hemoglobin testing may be clinically indicated, consider ordering this additional test [id number]    HCT 24.8 (L) 39.0 - 52.0 %   MCV 70.7 (L) 80.0 - 100.0 fL   MCH 21.1 (L) 26.0 - 34.0 pg   MCHC 29.8 (L) 30.0 - 36.0 g/dL   RDW 17.8 (H) 11.5 - 15.5 %   Platelets 105 (L) 150 - 400 K/uL    Comment: REPEATED TO VERIFY PLATELET COUNT CONFIRMED BY SMEAR SPECIMEN CHECKED FOR CLOTS    nRBC 0.6 (H) 0.0 - 0.2 %    Comment: Performed at [facility], [place] [address], [place], [place] [address]   No results found.  ROS: Pain with rom of the right upper extremity  Physical Exam: Alert and oriented 83 y.o. male in no acute distress Cranial nerves 2-12 intact Cervical spine: full rom with no tenderness, nv intact distally Chest: active breath sounds bilaterally, no wheeze rhonchi or rales Heart: regular rate and rhythm, no  murmur Abd: non tender non distended with active bowel sounds Hip is stable with rom  Right shoulder with improved rom after closed reduction but shoulder is loose s/p replacement nv intact distally No rashes or edema distally  Assessment/Plan Assessment: right shoulder reverse total dislocation  Plan:  Patient will undergo a right shoulder reverse total poly exchange by Dr. Veverly Fells at [facility]. Risks benefits and expectations were discussed with the patient. Patient understand risks, benefits and expectations and wishes to proceed. Preoperative templating of the joint replacement has been completed, documented, and submitted to the Operating Room personnel in order to optimize intra-operative equipment management.   Merla Riches PA-C, MPAS [facility] is now [facility] [address], [place], [place], [place] [address] Phone: [phone] [email or web address] Facebook  [employer]

## 2018-06-13 NOTE — Progress Notes (Signed)
Anesthesia Chart Review:  Case:  [id number] Date/Time:  06/14/18 1415   Procedure:  RIGHT  REVERSE TOTAL SHOULDER OPEN POLY EXCHANGE (Right )   Anesthesia type:  General   Pre-op diagnosis:  Right shoulder reverse dislocation   Location:  [facility] OR ROOM 05 / [place] OR   Surgeon:  Netta Cedars, MD      DISCUSSION: Pt is an 83 year old male with hx GERD, Rheumatoid arthritis, severe aortic stenosis (s/p prosthetic valve replacement 2009), post-op afib (no recurrence), and anemia.  He recently underwent right reverse TSA 03/01/2018. At that time he was cleared by PCP and cardiology. See Levada Dy Kabbe's note 02/28/18.  Pt with hx of anemia. PAT labs 06/13/2018 show Hgb 7.4. Trend displayed below:   Ref. Range 01/23/2018 10:18 01/29/2018 14:15 03/01/2018 10:25 03/02/2018 04:27 06/12/2018 10:59  Hemoglobin Latest Ref Range: 13.0 - 17.0 g/dL 8.9 (L) 8.6 (L) 8.8 (L) 10.8 (L) 7.4 (L)   Results called to Margarita Grizzle at Dr. Gilberto Better office. She discussed with Dr. Veverly Fells and said that they will order preop transfusion.  Anticipate he can proceed with surgery as planned barring acute status change.   VS: BP (!) 118/56   Pulse 74   Temp 36.6 C (Oral)   Resp 18   Ht 5\' 2"  (1.575 m)   Wt 45.3 kg   SpO2 100%   BMI 18.25 kg/m   PROVIDERS: Lavone Orn, MD is PCP  [name], MD is Cardiologist  LABS: Significant anemia with Hgb 7.4 and mild  (all labs ordered are listed, but only abnormal results are displayed)  Labs Reviewed  CBC - Abnormal; Notable for the following components:      Result Value   WBC 3.2 (*)    RBC 3.51 (*)    Hemoglobin 7.4 (*)    HCT 24.8 (*)    MCV 70.7 (*)    MCH 21.1 (*)    MCHC 29.8 (*)    RDW 17.8 (*)    Platelets 105 (*)    nRBC 0.6 (*)    All other components within normal limits  SURGICAL PCR SCREEN  BASIC METABOLIC PANEL     IMAGES: N/A  EKG: 01/29/18: NSR rate 73  CV: Echo 06/01/16: - Status, risk factors: Aortic Valve Replacement (25 mm Edwardstissue  valve, 2009) - Left ventricle: The cavity size was normal. Wall thickness wasnormal. Systolic function was normal. The estimated ejectionfraction was in the range of 60% to 65%. Wall motion was normal;there were no regional wall motion abnormalities. The study isnot technically sufficient to allow evaluation of LV diastolicfunction. - Aortic valve: A bioprosthesis was present. Functioning normally. Mild diffuse thickening. There was trivial regurgitation. - Mitral valve: Calcified annulus. Mildly thickened leaflets. There was mild regurgitation. - Left atrium: The atrium was mildly dilated. - Right atrium: The atrium was mildly dilated. -Impressions: Aortic regurgitation appears improved when compared to priorstudy.  CT coronary morphology 02/16/14: 1) No perivalvular aortic abscess. The tissue AVR is well seated with intact sewing ring. I reviewed the patients TTE/TEE from 5/19 and 01/29/14 The suspicious area was from off axis imaging of the valve and seeing the dilated non coronary sinus at the level of the AVR. 2) Normal aortic root 3) Calcium score 926 81st percentile for age and sex matched disease 4) Non obstructive calcific disease in all 3 major coronary arteries  Cardiac cath 01/13/08: 1. Nonobstructive coronary disease(OM2 20-30%). 2. Severe aortic stenosis by echocardiogram. 3. Normal left ventricular function by echocardiogram. 4.  Normal right heart pressures.  Past Medical History:  Diagnosis Date  . Anemia    Previous history of anemia  . Colon polyps    s/p diverticular perforation requiring 2-stage repair  . Coronary artery disease    a. 20-30% OM2 by cath 2009. b. nonobstructive of all 3 vessels by coronary CTA 2015.  . Diverticulosis   . DJD (degenerative joint disease), lumbosacral   . Esophageal stricture   . GERD (gastroesophageal reflux disease)   . History of kidney stones   . Mitral regurgitation 05/16/2016   mild by echo 05/2016  . Occipital  neuralgia   . Pneumonia   . Postoperative atrial fibrillation ([place]) 05/10/2015  . Rheumatoid arthritis ([place])    s/o long term steroids  . Rotator cuff arthropathy    right  . S/P aortic valve replacement with tissue   . SBE (subacute bacterial endocarditis) prophylaxis candidate    for dental procedures  . Severe aortic stenosis    S/P prosthetic valve replacement w 25 mm Edwards like science percardial tissue valve,Turner - 01/2008    Past Surgical History:  Procedure Laterality Date  . APPENDECTOMY    . BOTOX INJECTION N/A 01/22/2018   Procedure: BOTOX INJECTION;  Surgeon: Ronnette Juniper, MD;  [facility];  Service: Gastroenterology;  Laterality: N/A;  . CARDIAC CATHETERIZATION     09  . CARDIAC VALVE REPLACEMENT  01/2008   aortic valve replacement  . CATARACT EXTRACTION W/ INTRAOCULAR LENS  IMPLANT, BILATERAL    . COLON RESECTION     diverticulitis   . ESOPHAGEAL MANOMETRY N/A 11/07/2017   Procedure: ESOPHAGEAL MANOMETRY (EM);  Surgeon: Ronnette Juniper, MD;  [facility];  Service: Gastroenterology;  Laterality: N/A;  . ESOPHAGOGASTRODUODENOSCOPY (EGD) WITH PROPOFOL N/A 01/22/2018   Procedure: ESOPHAGOGASTRODUODENOSCOPY (EGD) WITH PROPOFOL;  Surgeon: Ronnette Juniper, MD;  [facility];  Service: Gastroenterology;  Laterality: N/A;  . HERNIA REPAIR    . LUMBAR LAMINECTOMY     x 2  . REVERSE SHOULDER ARTHROPLASTY Right 03/01/2018   Procedure: RIGHT REVERSE SHOULDER ARTHROPLASTY;  Surgeon: Netta Cedars, MD;  Location: [place];  Service: Orthopedics;  Laterality: Right;  . SAVORY DILATION N/A 01/22/2018   Procedure: SAVORY DILATION;  Surgeon: Ronnette Juniper, MD;  [facility];  Service: Gastroenterology;  Laterality: N/A;  . TEE WITHOUT CARDIOVERSION N/A 01/29/2014   Procedure: TRANSESOPHAGEAL ECHOCARDIOGRAM (TEE);  Surgeon: Sueanne Margarita, MD;  Location: [facility] ENDOSCOPY;  Service: Cardiovascular;  Laterality: N/A;    MEDICATIONS: . aspirin EC 81 MG EC tablet  .  Cholecalciferol (VITAMIN D3) 1000 units CAPS  . ferrous gluconate (IRON 27) 240 (27 FE) MG tablet  . folic acid (FOLVITE) 1 MG tablet  . HYDROcodone-acetaminophen (NORCO) 5-325 MG tablet  . KRILL OIL PO  . leflunomide (ARAVA) 20 MG tablet  . methotrexate (RHEUMATREX) 2.5 MG tablet  . metoprolol succinate (TOPROL-XL) 25 MG 24 hr tablet  . Multiple Vitamin (MULTIVITAMIN) tablet  . Naphazoline HCl (CLEAR EYES OP)  . pantoprazole (PROTONIX) 40 MG tablet  . predniSONE (DELTASONE) 5 MG tablet  . vitamin B-12 (CYANOCOBALAMIN) 1000 MCG tablet   No current facility-administered medications for this encounter.     Wynonia Musty [facility] Short Stay Center/Anesthesiology Phone [phone] 06/13/2018 8:57 AM

## 2018-06-13 NOTE — Anesthesia Preprocedure Evaluation (Addendum)
Anesthesia Evaluation  Patient identified by MRN, date of birth, ID band Patient awake    Reviewed: Allergy & Precautions, NPO status , Patient's Chart, lab work & pertinent test results, reviewed documented beta blocker date and time   History of Anesthesia Complications Negative for: history of anesthetic complications  Airway Mallampati: II  TM Distance: >3 FB Neck ROM: Limited    Dental  (+) Dental Advisory Given, Teeth Intact   Pulmonary former smoker,    breath sounds clear to auscultation       Cardiovascular (-) angina+ CAD  + dysrhythmias Atrial Fibrillation  Rhythm:Regular Rate:Normal + Systolic Click  AS now s/p prosthetic AVR 2009  '17 TTE - EF 60% to 65%. A bioprosthesis AV was present. Functioning normally. Trivial AI. Mild MR. B/l atria mildly dilated.   Neuro/Psych  Occipital neuralgia  negative psych ROS   GI/Hepatic Neg liver ROS, GERD  Controlled, Esophageal stricture    Endo/Other  negative endocrine ROS  Renal/GU negative Renal ROS  negative genitourinary   Musculoskeletal  (+) Arthritis , Rheumatoid disorders,    Abdominal   Peds  Hematology  (+) anemia ,  Thrombocytopenia Leukopenia    Anesthesia Other Findings   Reproductive/Obstetrics                          Anesthesia Physical Anesthesia Plan  ASA: III  Anesthesia Plan: General   Post-op Pain Management:    Induction: Intravenous  PONV Risk Score and Plan: 2 and Treatment may vary due to age or medical condition, Ondansetron and Dexamethasone  Airway Management Planned: Oral ETT  Additional Equipment: None  Intra-op Plan:   Post-operative Plan: Extubation in OR  Informed Consent: I have reviewed the patients History and Physical, chart, labs and discussed the procedure including the risks, benefits and alternatives for the proposed anesthesia with the patient or authorized representative who  has indicated his/her understanding and acceptance.   Dental advisory given  Plan Discussed with: CRNA, Anesthesiologist and Surgeon  Anesthesia Plan Comments: (Discussed with surgeon, does not want patient to receive a nerve block.)     Anesthesia Quick Evaluation

## 2018-06-14 ENCOUNTER — Encounter (HOSPITAL_COMMUNITY)
Admission: RE | Disposition: A | Discharge status: Home / Self Care | Payer: Self-pay | Source: Ambulatory Visit | Attending: Orthopedic Surgery

## 2018-06-14 ENCOUNTER — Ambulatory Visit (HOSPITAL_COMMUNITY)
Admission: RE | Admit: 2018-06-14 | Discharge: 2018-06-14 | Disposition: A | Discharge status: Home / Self Care | Payer: Medicare Other | Source: Ambulatory Visit | Attending: Orthopedic Surgery | Admitting: Orthopedic Surgery

## 2018-06-14 ENCOUNTER — Other Ambulatory Visit: Payer: Self-pay

## 2018-06-14 ENCOUNTER — Ambulatory Visit (HOSPITAL_COMMUNITY): Payer: Medicare Other | Admitting: Anesthesiology

## 2018-06-14 ENCOUNTER — Encounter (HOSPITAL_COMMUNITY): Payer: Self-pay | Admitting: *Deleted

## 2018-06-14 ENCOUNTER — Ambulatory Visit (HOSPITAL_COMMUNITY): Payer: Medicare Other | Admitting: Physician Assistant

## 2018-06-14 DIAGNOSIS — M1990 Unspecified osteoarthritis, unspecified site: Secondary | ICD-10-CM | POA: Diagnosis not present

## 2018-06-14 DIAGNOSIS — Z96611 Presence of right artificial shoulder joint: Secondary | ICD-10-CM | POA: Insufficient documentation

## 2018-06-14 DIAGNOSIS — Z7952 Long term (current) use of systemic steroids: Secondary | ICD-10-CM | POA: Insufficient documentation

## 2018-06-14 DIAGNOSIS — T84028A Dislocation of other internal joint prosthesis, initial encounter: Secondary | ICD-10-CM | POA: Insufficient documentation

## 2018-06-14 DIAGNOSIS — Z87891 Personal history of nicotine dependence: Secondary | ICD-10-CM | POA: Diagnosis not present

## 2018-06-14 DIAGNOSIS — Z7982 Long term (current) use of aspirin: Secondary | ICD-10-CM | POA: Diagnosis not present

## 2018-06-14 DIAGNOSIS — K219 Gastro-esophageal reflux disease without esophagitis: Secondary | ICD-10-CM | POA: Diagnosis not present

## 2018-06-14 DIAGNOSIS — Z79899 Other long term (current) drug therapy: Secondary | ICD-10-CM | POA: Insufficient documentation

## 2018-06-14 DIAGNOSIS — I2510 Atherosclerotic heart disease of native coronary artery without angina pectoris: Secondary | ICD-10-CM | POA: Insufficient documentation

## 2018-06-14 HISTORY — PX: SHOULDER HEMI-ARTHROPLASTY: SHX5049

## 2018-06-14 LAB — PREPARE RBC (CROSSMATCH)

## 2018-06-14 SURGERY — HEMIARTHROPLASTY, SHOULDER
Anesthesia: General | Laterality: Right

## 2018-06-14 MED ORDER — PHENYLEPHRINE 40 MCG/ML (10ML) SYRINGE FOR IV PUSH (FOR BLOOD PRESSURE SUPPORT)
PREFILLED_SYRINGE | INTRAVENOUS | Status: AC
  Filled 2018-06-14: qty 20

## 2018-06-14 MED ORDER — SODIUM CHLORIDE 0.9% IV SOLUTION: Freq: Once | INTRAVENOUS | Status: DC

## 2018-06-14 MED ORDER — SODIUM CHLORIDE 0.9 % IV SOLN
INTRAVENOUS | Status: DC | PRN
  Administered 2018-06-14: 50 ug/min via INTRAVENOUS

## 2018-06-14 MED ORDER — LACTATED RINGERS IV SOLN
INTRAVENOUS | Status: DC | PRN
  Administered 2018-06-14: 16:00:00 via INTRAVENOUS

## 2018-06-14 MED ORDER — FENTANYL CITRATE (PF) 250 MCG/5ML IJ SOLN
INTRAMUSCULAR | Status: DC | PRN
  Administered 2018-06-14 (×3): 50 ug via INTRAVENOUS

## 2018-06-14 MED ORDER — FENTANYL CITRATE (PF) 100 MCG/2ML IJ SOLN
INTRAMUSCULAR | Status: AC
  Filled 2018-06-14: qty 2

## 2018-06-14 MED ORDER — ONDANSETRON HCL 4 MG/2ML IJ SOLN
INTRAMUSCULAR | Status: AC
  Filled 2018-06-14: qty 2

## 2018-06-14 MED ORDER — ONDANSETRON HCL 4 MG/2ML IJ SOLN
4.0000 mg | Freq: Once | INTRAMUSCULAR | Status: AC | PRN
  Administered 2018-06-14: 4 mg via INTRAVENOUS

## 2018-06-14 MED ORDER — SODIUM CHLORIDE 0.9 % IV SOLN
INTRAVENOUS | Status: DC | PRN
  Administered 2018-06-14: 16:00:00 via INTRAVENOUS

## 2018-06-14 MED ORDER — METHOCARBAMOL 500 MG PO TABS
ORAL_TABLET | ORAL | Status: AC
  Filled 2018-06-14: qty 1

## 2018-06-14 MED ORDER — ONDANSETRON HCL 4 MG/2ML IJ SOLN
INTRAMUSCULAR | Status: DC | PRN
  Administered 2018-06-14: 4 mg via INTRAVENOUS

## 2018-06-14 MED ORDER — PROPOFOL 10 MG/ML IV BOLUS
INTRAVENOUS | Status: AC
  Filled 2018-06-14: qty 20

## 2018-06-14 MED ORDER — BUPIVACAINE HCL (PF) 0.25 % IJ SOLN
INTRAMUSCULAR | Status: AC
  Filled 2018-06-14: qty 30

## 2018-06-14 MED ORDER — 0.9 % SODIUM CHLORIDE (POUR BTL) OPTIME
TOPICAL | Status: DC | PRN
  Administered 2018-06-14: 1000 mL

## 2018-06-14 MED ORDER — LIDOCAINE 2% (20 MG/ML) 5 ML SYRINGE
INTRAMUSCULAR | Status: DC | PRN
  Administered 2018-06-14: 60 mg via INTRAVENOUS

## 2018-06-14 MED ORDER — PHENYLEPHRINE 40 MCG/ML (10ML) SYRINGE FOR IV PUSH (FOR BLOOD PRESSURE SUPPORT)
PREFILLED_SYRINGE | INTRAVENOUS | Status: DC | PRN
  Administered 2018-06-14: 40 ug via INTRAVENOUS

## 2018-06-14 MED ORDER — DEXAMETHASONE SODIUM PHOSPHATE 10 MG/ML IJ SOLN
INTRAMUSCULAR | Status: AC
  Filled 2018-06-14: qty 1

## 2018-06-14 MED ORDER — ROCURONIUM BROMIDE 10 MG/ML (PF) SYRINGE
PREFILLED_SYRINGE | INTRAVENOUS | Status: DC | PRN
  Administered 2018-06-14: 30 mg via INTRAVENOUS

## 2018-06-14 MED ORDER — METHOCARBAMOL 500 MG PO TABS
500.0000 mg | ORAL_TABLET | Freq: Four times a day (QID) | ORAL | Status: DC | PRN
  Administered 2018-06-14: 500 mg via ORAL

## 2018-06-14 MED ORDER — ROCURONIUM BROMIDE 50 MG/5ML IV SOSY
PREFILLED_SYRINGE | INTRAVENOUS | Status: AC
  Filled 2018-06-14: qty 5

## 2018-06-14 MED ORDER — CHLORHEXIDINE GLUCONATE 4 % EX LIQD: 60.0000 mL | Freq: Once | CUTANEOUS | Status: DC

## 2018-06-14 MED ORDER — PROPOFOL 10 MG/ML IV BOLUS
INTRAVENOUS | Status: DC | PRN
  Administered 2018-06-14: 90 mg via INTRAVENOUS

## 2018-06-14 MED ORDER — BUPIVACAINE-EPINEPHRINE (PF) 0.25% -1:200000 IJ SOLN
INTRAMUSCULAR | Status: DC | PRN
  Administered 2018-06-14: 4 mL via PERINEURAL

## 2018-06-14 MED ORDER — OXYCODONE HCL 5 MG/5ML PO SOLN: 5.0000 mg | Freq: Once | ORAL | Status: AC | PRN

## 2018-06-14 MED ORDER — OXYCODONE HCL 5 MG PO TABS
ORAL_TABLET | ORAL | Status: AC
  Filled 2018-06-14: qty 1

## 2018-06-14 MED ORDER — TRAMADOL HCL 50 MG PO TABS: 50.0000 mg | ORAL_TABLET | Freq: Four times a day (QID) | ORAL | 0 refills | Status: DC | PRN

## 2018-06-14 MED ORDER — CEFAZOLIN SODIUM 1 G IJ SOLR
INTRAMUSCULAR | Status: AC
  Filled 2018-06-14: qty 20

## 2018-06-14 MED ORDER — OXYCODONE HCL 5 MG PO TABS
5.0000 mg | ORAL_TABLET | Freq: Once | ORAL | Status: AC | PRN
  Administered 2018-06-14: 5 mg via ORAL

## 2018-06-14 MED ORDER — FENTANYL CITRATE (PF) 250 MCG/5ML IJ SOLN
INTRAMUSCULAR | Status: AC
  Filled 2018-06-14: qty 5

## 2018-06-14 MED ORDER — CEFAZOLIN SODIUM-DEXTROSE 2-4 GM/100ML-% IV SOLN
2.0000 g | INTRAVENOUS | Status: AC
  Administered 2018-06-14: 2 g via INTRAVENOUS

## 2018-06-14 MED ORDER — OXYCODONE HCL 5 MG PO TABS: 5.0000 mg | ORAL_TABLET | ORAL | 0 refills | Status: DC | PRN

## 2018-06-14 MED ORDER — DEXAMETHASONE SODIUM PHOSPHATE 10 MG/ML IJ SOLN
INTRAMUSCULAR | Status: AC
  Filled 2018-06-14: qty 2

## 2018-06-14 MED ORDER — SUGAMMADEX SODIUM 200 MG/2ML IV SOLN
INTRAVENOUS | Status: DC | PRN
  Administered 2018-06-14: 100 mg via INTRAVENOUS

## 2018-06-14 MED ORDER — EPHEDRINE 5 MG/ML INJ
INTRAVENOUS | Status: AC
  Filled 2018-06-14: qty 20

## 2018-06-14 MED ORDER — ONDANSETRON HCL 4 MG PO TABS: 4.0000 mg | ORAL_TABLET | Freq: Three times a day (TID) | ORAL | Status: DC | PRN

## 2018-06-14 MED ORDER — ONDANSETRON HCL 4 MG/2ML IJ SOLN
INTRAMUSCULAR | Status: AC
  Filled 2018-06-14: qty 4

## 2018-06-14 MED ORDER — PROPOFOL 1000 MG/100ML IV EMUL
INTRAVENOUS | Status: AC
  Filled 2018-06-14: qty 200

## 2018-06-14 MED ORDER — METHOCARBAMOL 1000 MG/10ML IJ SOLN: 500.0000 mg | Freq: Four times a day (QID) | INTRAVENOUS | Status: DC | PRN

## 2018-06-14 MED ORDER — FENTANYL CITRATE (PF) 100 MCG/2ML IJ SOLN
25.0000 ug | INTRAMUSCULAR | Status: DC | PRN
  Administered 2018-06-14 (×2): 50 ug via INTRAVENOUS

## 2018-06-14 MED ORDER — ONDANSETRON HCL 4 MG PO TABS: 4.0000 mg | ORAL_TABLET | Freq: Four times a day (QID) | ORAL | 0 refills | Status: DC | PRN

## 2018-06-14 MED ORDER — DEXAMETHASONE SODIUM PHOSPHATE 10 MG/ML IJ SOLN
INTRAMUSCULAR | Status: DC | PRN
  Administered 2018-06-14: 5 mg via INTRAVENOUS

## 2018-06-14 MED ORDER — LIDOCAINE 2% (20 MG/ML) 5 ML SYRINGE
INTRAMUSCULAR | Status: AC
  Filled 2018-06-14: qty 5

## 2018-06-14 SURGICAL SUPPLY — 62 items
BLADE SAW SAG 73X25 THK (BLADE)
BLADE SAW SGTL 73X25 THK (BLADE) IMPLANT
BOWL SMART MIX CTS (DISPOSABLE) IMPLANT
CLOSURE STERI-STRIP 1/2X4 (GAUZE/BANDAGES/DRESSINGS) ×1
CLOSURE WOUND 1/2 X4 (GAUZE/BANDAGES/DRESSINGS) ×1
CLSR STERI-STRIP ANTIMIC 1/2X4 (GAUZE/BANDAGES/DRESSINGS) ×2 IMPLANT
COVER SURGICAL LIGHT HANDLE (MISCELLANEOUS) ×3 IMPLANT
COVER WAND RF STERILE (DRAPES) ×3 IMPLANT
DRAPE IMP U-DRAPE 54X76 (DRAPES) ×3 IMPLANT
DRAPE INCISE IOBAN 66X45 STRL (DRAPES) ×3 IMPLANT
DRAPE ORTHO SPLIT 77X108 STRL (DRAPES) ×4
DRAPE SURG ORHT 6 SPLT 77X108 (DRAPES) ×2 IMPLANT
DRAPE U-SHAPE 47X51 STRL (DRAPES) ×3 IMPLANT
DRILL BIT 5/64 (BIT) ×3 IMPLANT
DRSG ADAPTIC 3X8 NADH LF (GAUZE/BANDAGES/DRESSINGS) ×3 IMPLANT
DRSG PAD ABDOMINAL 8X10 ST (GAUZE/BANDAGES/DRESSINGS) ×6 IMPLANT
DURAPREP 26ML APPLICATOR (WOUND CARE) ×3 IMPLANT
ELECT NEEDLE TIP 2.8 STRL (NEEDLE) ×3 IMPLANT
ELECT REM PT RETURN 9FT ADLT (ELECTROSURGICAL) ×3
ELECTRODE REM PT RTRN 9FT ADLT (ELECTROSURGICAL) ×1 IMPLANT
GAUZE SPONGE 4X4 12PLY STRL (GAUZE/BANDAGES/DRESSINGS) ×3 IMPLANT
GAUZE SPONGE 4X4 12PLY STRL LF (GAUZE/BANDAGES/DRESSINGS) ×3 IMPLANT
GLOVE BIOGEL PI ORTHO PRO 7.5 (GLOVE) ×2
GLOVE BIOGEL PI ORTHO PRO SZ8 (GLOVE) ×2
GLOVE ORTHO TXT STRL SZ7.5 (GLOVE) ×3 IMPLANT
GLOVE PI ORTHO PRO STRL 7.5 (GLOVE) ×1 IMPLANT
GLOVE PI ORTHO PRO STRL SZ8 (GLOVE) ×1 IMPLANT
GLOVE SURG ORTHO 8.5 STRL (GLOVE) ×3 IMPLANT
GOWN STRL REUS W/ TWL XL LVL3 (GOWN DISPOSABLE) ×2 IMPLANT
GOWN STRL REUS W/TWL XL LVL3 (GOWN DISPOSABLE) ×4
INSERT REV KIT SHOULDER 6X39 (Screw) ×3 IMPLANT
KIT BASIN OR (CUSTOM PROCEDURE TRAY) ×3 IMPLANT
KIT TURNOVER KIT B (KITS) ×3 IMPLANT
MANIFOLD NEPTUNE II (INSTRUMENTS) ×3 IMPLANT
NDL SUT .5 MAYO 1.404X.05X (NEEDLE) ×1 IMPLANT
NEEDLE HYPO 25GX1X1/2 BEV (NEEDLE) ×3 IMPLANT
NEEDLE MAYO TAPER (NEEDLE) ×2
NS IRRIG 1000ML POUR BTL (IV SOLUTION) ×3 IMPLANT
PACK SHOULDER (CUSTOM PROCEDURE TRAY) ×3 IMPLANT
PACK UNIVERSAL I (CUSTOM PROCEDURE TRAY) IMPLANT
PAD ABD 8X10 STRL (GAUZE/BANDAGES/DRESSINGS) ×3 IMPLANT
PAD ARMBOARD 7.5X6 YLW CONV (MISCELLANEOUS) ×3 IMPLANT
SLING ARM IMMOBILIZER LRG (SOFTGOODS) ×3 IMPLANT
SLING ARM IMMOBILIZER MED (SOFTGOODS) IMPLANT
SPONGE LAP 18X18 X RAY DECT (DISPOSABLE) ×3 IMPLANT
STRIP CLOSURE SKIN 1/2X4 (GAUZE/BANDAGES/DRESSINGS) ×2 IMPLANT
SUCTION FRAZIER HANDLE 10FR (MISCELLANEOUS) ×2
SUCTION TUBE FRAZIER 10FR DISP (MISCELLANEOUS) ×1 IMPLANT
SUT FIBERWIRE #2 38 T-5 BLUE (SUTURE) ×6
SUT MNCRL AB 4-0 PS2 18 (SUTURE) ×3 IMPLANT
SUT VIC AB 0 CT1 27 (SUTURE) ×2
SUT VIC AB 0 CT1 27XBRD ANBCTR (SUTURE) ×1 IMPLANT
SUT VIC AB 2-0 CT1 27 (SUTURE) ×2
SUT VIC AB 2-0 CT1 TAPERPNT 27 (SUTURE) ×1 IMPLANT
SUTURE FIBERWR #2 38 T-5 BLUE (SUTURE) ×2 IMPLANT
SYR CONTROL 10ML LL (SYRINGE) ×3 IMPLANT
TAPE CLOTH SURG 4X10 WHT LF (GAUZE/BANDAGES/DRESSINGS) ×3 IMPLANT
TOWEL OR 17X24 6PK STRL BLUE (TOWEL DISPOSABLE) ×3 IMPLANT
TOWEL OR 17X26 10 PK STRL BLUE (TOWEL DISPOSABLE) ×3 IMPLANT
TRAY FOLEY MTR SLVR 16FR STAT (SET/KITS/TRAYS/PACK) IMPLANT
TRAY SHOULD OFFSET AEQ 12.5X12 (Shoulder) ×3 IMPLANT
WATER STERILE IRR 1000ML POUR (IV SOLUTION) IMPLANT

## 2018-06-14 NOTE — Discharge Instructions (Signed)
Ice to the shoulder constantly.  Keep the incision covered and clean and dry for one week, then ok to get it wet in the shower.  Do exercise as instructed several times per day.  DO NOT reach behind your back or push up out of a chair with the operative arm.  Use a sling while you are up and around for comfort, may remove while seated.  Keep pillow propped behind the operative elbow.  Follow up with Dr Veverly Fells in one to two weeks in the office, call [phone] for appt

## 2018-06-14 NOTE — Op Note (Signed)
NAME: NIV, DARLEY MEDICAL RECORD JK:[id number] ACCOUNT [id number] DATE OF BIRTH:20-Dec-1934 F[facility] LOCATION: [facility] PHYSICIAN:STEVEN Orlena Sheldon, MD  OPERATIVE REPORT  DATE OF PROCEDURE:  06/14/2018  PREOPERATIVE DIAGNOSIS:  Right shoulder reverse arthroplasty instability.  POSTOPERATIVE DIAGNOSIS:  Right shoulder reverse arthroplasty instability.  PROCEDURE PERFORMED:  Right shoulder reverse arthroplasty exchange of humeral tray and polyethylene liner and obtaining of deep cultures.  ATTENDING SURGEON:  Esmond Plants, MD  ASSISTANT:  Darol Destine, [place], who was scrubbed during the entire procedure and necessary for satisfactory completion of surgery.  ANESTHESIA:  General anesthesia plus local was used.  ESTIMATED BLOOD LOSS:  100 mL.  FLUID REPLACEMENT:  1000 mL crystalloid as well as 800 mL of packed red cells.  We did 1 unit of packed red cells for 400 mL in the preop holding area and 1 unit back in the operating room for a low hemoglobin of 7.4 preop.  We did obtain deep cultures  which were sent to micro, and that was fluid from the shoulder.  INSTRUMENT COUNTS:  Correct.  COMPLICATIONS:  None.  DRAINS:  None.  INDICATIONS:  The patient is an 83 year old male with a history of a right shoulder reverse arthroplasty for end-stage rotator cuff tear arthropathy.  Postoperatively, the patient did well for the first 3 months and then awoke with his shoulder  dislocated.  The patient presented to the orthopedic clinic and was reduced using manual traction in the clinic.  The patient was fairly loose and could be perched just with gentle pressure from the posterior aspect of the shoulder.  We are concerned  that he would continue to re-dislocate with this shoulder the way that it was.  We felt that we would have to tension the shoulder with lengthening him on the humeral side.  We talked to the patient about this.  The patient agreed to proceed with surgery  to  increase the offset and the length on the humeral side.  Informed consent was obtained.  DESCRIPTION OF PROCEDURE:  After adequate level of anesthesia was achieved, the patient was positioned supine on the operating room table.  He was brought up in the modified beach-chair position.  Right shoulder correctly identified and sterilely prepped  and draped in the usual manner.  Timeout was called.  We used the patient's prior deltopectoral incision and started at the coracoid process, extending down to the anterior humerus, dissection down through subcutaneous tissues.  We felt like we had gone  medial to the deltoid and lateral to the pectoralis.  After the fact, it seemed like he had a very vertical pec, and perhaps we were mid pec and right into the conjoined muscle.  The majority of the tendon was medial, and some of that muscle was lateral  side, so there was a little bit of a difficult interval there, but it seemed to be the interval where with just finger dissection everything dropped.  There was a hematoma.  We did culture that with aerobic, anaerobic, Gram stain, and then we were able  to dislocate the shoulder.  It definitely felt loose to me and felt like it needed to be tightened up.  We started initially with the +6 tray and then went to the +12.  We also changed the offset, dialing from the 5.5 setting to the 12 setting which  increased the humeral offset and lateralized.  We went with the same +6 poly.  We did remove the poly without difficulty, and after trialing  and selecting a proper implant, we made sure that the glenosphere was stable, which it was.  There was plenty of  inferior overhang.  We irrigated very thoroughly.  We also placed the broach handle on the stem and the humerus, and that was stable, so we went ahead and impacted the real +12, 1.5 offset humeral tray, dialing to the #12 to get the offset and then  impacted the +6 poly into place.  Then we reduced the shoulder.  There was  a nice little pop and that reduced.  We felt like the tensioning on the conjoined tendon was appropriate, and everything was quite stable through a full arc of motion.  We  irrigated thoroughly, closed deltopectoral interval with 0 Vicryl suture followed by 2-0 Vicryl for subcutaneous closure and 4-0 Monocryl for skin.  Steri-Strips applied followed by a sterile dressing.  The patient tolerated surgery well.  LN/NUANCE  D:06/14/2018 T:06/14/2018 JOB:[id number]

## 2018-06-14 NOTE — Brief Op Note (Signed)
06/14/2018  5:32 PM  PATIENT:  Joshua Hudson  83 y.o. male  PRE-OPERATIVE DIAGNOSIS:  Right shoulder reverse instability  POST-OPERATIVE DIAGNOSIS:  Right shoulder reverse instability  PROCEDURE:  Right shoulder reverse arthroplasty change of humeral tray and polyethylene liner  SURGEON:  Surgeon(s) and Role:    Netta Cedars, MD - Primary  PHYSICIAN ASSISTANT:   ASSISTANTS: [place] Bruns, PA-C   ANESTHESIA:   local  EBL:  50 mL   BLOOD ADMINISTERED:800 CC PRBC 2 units, one unit in pre-op the other in OR  DRAINS: none   LOCAL MEDICATIONS USED:  MARCAINE     SPECIMEN:  Source of Specimen:  right shoulder fluid  DISPOSITION OF SPECIMEN:  micro  COUNTS:  YES  TOURNIQUET:  * No tourniquets in log *  DICTATION: .Other Dictation: Dictation Number [id number]  PLAN OF CARE: Discharge to home after PACU  PATIENT DISPOSITION:  PACU - hemodynamically stable.   Delay start of Pharmacological VTE agent (>24hrs) due to surgical blood loss or risk of bleeding: not applicable

## 2018-06-14 NOTE — Anesthesia Procedure Notes (Signed)
Procedure Name: Intubation Date/Time: 06/14/2018 3:51 PM Performed by: Valda Favia, CRNA Pre-anesthesia Checklist: Patient identified, Emergency Drugs available, Suction available and Patient being monitored Patient Re-evaluated:Patient Re-evaluated prior to induction Oxygen Delivery Method: Circle System Utilized Preoxygenation: Pre-oxygenation with 100% oxygen Induction Type: IV induction Ventilation: Mask ventilation without difficulty Laryngoscope Size: Mac and 4 Grade View: Grade I Tube type: Oral Tube size: 7.0 mm Number of attempts: 1 Airway Equipment and Method: Stylet and Oral airway Placement Confirmation: ETT inserted through vocal cords under direct vision,  positive ETCO2 and breath sounds checked- equal and bilateral Secured at: 22 cm Tube secured with: Tape Dental Injury: Teeth and Oropharynx as per pre-operative assessment

## 2018-06-14 NOTE — Interval H&P Note (Signed)
History and Physical Interval Note:  06/14/2018 3:05 PM  Joshua Hudson  has presented today for surgery, with the diagnosis of Right shoulder reverse dislocation  The various methods of treatment have been discussed with the patient and family. After consideration of risks, benefits and other options for treatment, the patient has consented to  Procedure(s): RIGHT  REVERSE TOTAL SHOULDER OPEN POLY EXCHANGE (Right) as a surgical intervention .  The patient's history has been reviewed, patient examined, no change in status, stable for surgery.  I have reviewed the patient's chart and labs.  Questions were answered to the patient's satisfaction.     Joshua Hudson,STEVEN R

## 2018-06-14 NOTE — Anesthesia Postprocedure Evaluation (Signed)
Anesthesia Post Note  Patient: Joshua Hudson  Procedure(s) Performed: RIGHT  REVERSE TOTAL SHOULDER OPEN POLY EXCHANGE (Right )     Patient location during evaluation: PACU Anesthesia Type: General Level of consciousness: awake and alert Pain management: pain level controlled Vital Signs Assessment: post-procedure vital signs reviewed and stable Respiratory status: spontaneous breathing, nonlabored ventilation and respiratory function stable Cardiovascular status: blood pressure returned to baseline and stable Postop Assessment: no apparent nausea or vomiting Anesthetic complications: no    Last Vitals:  Vitals:   06/14/18 1715 06/14/18 1730  BP: (!) 112/58 133/77  Pulse: 61 67  Resp: 14 20  Temp:    SpO2: 97% 93%    Last Pain:  Vitals:   06/14/18 1422  TempSrc: Oral  PainSc:                  [name],W. EDMOND

## 2018-06-14 NOTE — Transfer of Care (Signed)
Immediate Anesthesia Transfer of Care Note  Patient: Joshua Hudson  Procedure(s) Performed: RIGHT  REVERSE TOTAL SHOULDER OPEN POLY EXCHANGE (Right )  Patient Location: PACU  Anesthesia Type:General  Level of Consciousness: awake, alert  and oriented  Airway & Oxygen Therapy: Patient Spontanous Breathing  Post-op Assessment: Report given to RN and Post -op Vital signs reviewed and stable  Post vital signs: Reviewed and stable  Last Vitals:  Vitals Value Taken Time  BP    Temp    Pulse 65 06/14/2018  5:09 PM  Resp 18 06/14/2018  5:09 PM  SpO2 100 % 06/14/2018  5:09 PM  Vitals shown include unvalidated device data.  Last Pain:  Vitals:   06/14/18 1422  TempSrc: Oral  PainSc:       Patients Stated Pain Goal: 2 (71/21/97 5883)  Complications: No apparent anesthesia complications

## 2018-06-15 LAB — BPAM RBC
Blood Product Expiration Date: 201910172359
Blood Product Expiration Date: 201911012359
ISSUE DATE / TIME: 201910111234
ISSUE DATE / TIME: 201910111535
Unit Type and Rh: 6200
Unit Type and Rh: 6200

## 2018-06-15 LAB — TYPE AND SCREEN
ABO/RH(D): A POS
ANTIBODY SCREEN: NEGATIVE
UNIT DIVISION: 0
Unit division: 0

## 2018-06-18 ENCOUNTER — Encounter (HOSPITAL_COMMUNITY): Payer: Self-pay | Admitting: Orthopedic Surgery

## 2018-06-19 LAB — AEROBIC/ANAEROBIC CULTURE W GRAM STAIN (SURGICAL/DEEP WOUND)

## 2018-06-19 LAB — AEROBIC/ANAEROBIC CULTURE (SURGICAL/DEEP WOUND)

## 2018-07-04 NOTE — Progress Notes (Signed)
Cardiology Office Note:    Date:  07/05/2018   ID:  Joshua Hudson, DOB 1934/10/19, MRN [id number]  PCP:  Joshua Orn, MD  Cardiologist:  Joshua Him, MD  He  Referring MD: Joshua Orn, MD   Chief Complaint  Patient presents with  . Aortic Stenosis  . Atrial Fibrillation  . Coronary Artery Disease    History of Present Illness:    Joshua Hudson is a 83 y.o. male with a hx of severe AS s/p prosthetic AVR with post op afib with no reoccurence, nonobstructive ASCAD with cath 2009 with 20-30% OM2 otherwise normal cors.  He is here today for followup and is doing well.  He denies any chest pain or pressure, SOB, DOE, PND, orthopnea, LE edema, dizziness, palpitations or syncope. He is compliant with his meds and is tolerating meds with no SE.  Marland Kitchen  Past Medical History:  Diagnosis Date  . Anemia    Previous history of anemia  . Colon polyps    s/p diverticular perforation requiring 2-stage repair  . Coronary artery disease    a. 20-30% OM2 by cath 2009. b. nonobstructive of all 3 vessels by coronary CTA 2015.  . Diverticulosis   . DJD (degenerative joint disease), lumbosacral   . Esophageal stricture   . GERD (gastroesophageal reflux disease)   . History of kidney stones   . Mitral regurgitation 05/16/2016   mild by echo 05/2016  . Occipital neuralgia   . Pneumonia   . Postoperative atrial fibrillation ([place]) 05/10/2015  . Rheumatoid arthritis ([place])    s/o long term steroids  . Rotator cuff arthropathy    right  . S/P aortic valve replacement with tissue   . SBE (subacute bacterial endocarditis) prophylaxis candidate    for dental procedures  . Severe aortic stenosis    S/P prosthetic valve replacement w 25 mm Edwards like science percardial tissue valve,Turner - 01/2008    Past Surgical History:  Procedure Laterality Date  . APPENDECTOMY    . BOTOX INJECTION N/A 01/22/2018   Procedure: BOTOX INJECTION;  Surgeon: Joshua Juniper, MD;  Location: [facility];  Service:  Gastroenterology;  Laterality: N/A;  . CARDIAC CATHETERIZATION     09  . CARDIAC VALVE REPLACEMENT  01/2008   aortic valve replacement  . CATARACT EXTRACTION W/ INTRAOCULAR LENS  IMPLANT, BILATERAL    . COLON RESECTION     diverticulitis   . ESOPHAGEAL MANOMETRY N/A 11/07/2017   Procedure: ESOPHAGEAL MANOMETRY (EM);  Surgeon: Joshua Juniper, MD;  [facility]: [facility];  Service: Gastroenterology;  Laterality: N/A;  . ESOPHAGOGASTRODUODENOSCOPY (EGD) WITH PROPOFOL N/A 01/22/2018   Procedure: ESOPHAGOGASTRODUODENOSCOPY (EGD) WITH PROPOFOL;  Surgeon: Joshua Juniper, MD;  [facility]: [facility];  Service: Gastroenterology;  Laterality: N/A;  . HERNIA REPAIR    . LUMBAR LAMINECTOMY     x 2  . REVERSE SHOULDER ARTHROPLASTY Right 03/01/2018   Procedure: RIGHT REVERSE SHOULDER ARTHROPLASTY;  Surgeon: Joshua Cedars, MD;  Location: [place];  Service: Orthopedics;  Laterality: Right;  . SAVORY DILATION N/A 01/22/2018   Procedure: SAVORY DILATION;  Surgeon: Joshua Juniper, MD;  [facility];  Service: Gastroenterology;  Laterality: N/A;  . SHOULDER HEMI-ARTHROPLASTY Right 06/14/2018   Procedure: RIGHT  REVERSE TOTAL SHOULDER OPEN POLY EXCHANGE;  Surgeon: Joshua Cedars, MD;  Location: [place];  Service: Orthopedics;  Laterality: Right;  . TEE WITHOUT CARDIOVERSION N/A 01/29/2014   Procedure: TRANSESOPHAGEAL ECHOCARDIOGRAM (TEE);  Surgeon: Joshua Margarita, MD;  Location: [place];  Service: Cardiovascular;  Laterality: N/A;    Current Medications: Current Meds  Medication Sig  . aspirin EC 81 MG EC tablet Take 1 tablet (81 mg total) by mouth daily.  . Cholecalciferol (VITAMIN D3) 1000 units CAPS Take 1,000 Units by mouth daily.   . ferrous gluconate (IRON 27) 240 (27 FE) MG tablet Take 240 mg by mouth daily.  . folic acid (FOLVITE) 1 MG tablet Take 1 mg by mouth daily.  Marland Kitchen KRILL OIL PO Take 750 mg by mouth daily.  Marland Kitchen leflunomide (ARAVA) 20 MG tablet Take 20 mg by mouth daily.  . methotrexate (RHEUMATREX)  2.5 MG tablet Take 20 mg by mouth every Sunday. Caution:Chemotherapy. Protect from light.   . metoprolol succinate (TOPROL-XL) 25 MG 24 hr tablet TAKE 1 TABLET BY MOUTH ONCE DAILY  . Multiple Vitamin (MULTIVITAMIN) tablet Take 1 tablet by mouth daily.  . Naphazoline HCl (CLEAR EYES OP) Place 1 drop into both eyes daily as needed (for dry eyes).   . ondansetron (ZOFRAN) 4 MG tablet Take 1 tablet (4 mg total) by mouth every 6 (six) hours as needed for nausea or vomiting.  Marland Kitchen oxyCODONE (ROXICODONE) 5 MG immediate release tablet Take 1 tablet (5 mg total) by mouth every 4 (four) hours as needed for severe pain or breakthrough pain.  . pantoprazole (PROTONIX) 40 MG tablet Take 40 mg by mouth daily.   . predniSONE (DELTASONE) 5 MG tablet Take 5 mg by mouth daily with breakfast.   . traMADol (ULTRAM) 50 MG tablet Take 1-2 tablets (50-100 mg total) by mouth every 6 (six) hours as needed for moderate pain or severe pain.  . vitamin B-12 (CYANOCOBALAMIN) 1000 MCG tablet Take 1,000 mcg by mouth daily.     Allergies:   Patient has no known allergies.   Social History   Socioeconomic History  . Marital status: Married    Spouse name: Not on file  . Number of children: 2  . Years of education: Not on file  . Highest education level: Not on file  Occupational History  . Occupation: Retired Market researcher at [employer]  . Financial resource strain: Not on file  . Food insecurity:    Worry: Not on file    Inability: Not on file  . Transportation needs:    Medical: Not on file    Non-medical: Not on file  Tobacco Use  . Smoking status: Former Smoker    Packs/day: 0.50    Years: 10.00    Pack years: 5.00    Types: Cigarettes    Start date: 01/16/1974    Last attempt to quit: 09/05/1983    Years since quitting: 34.8  . Smokeless tobacco: Former Systems developer    Types: Chew  Substance and Sexual Activity  . Alcohol use: Not Currently    Alcohol/week: 0.0 standard drinks  . Drug use: No  . Sexual activity:  Not on file  Lifestyle  . Physical activity:    Days per week: Not on file    Minutes per session: Not on file  . Stress: Not on file  Relationships  . Social connections:    Talks on phone: Not on file    Gets together: Not on file    Attends religious service: Not on file    Active member of club or organization: Not on file    Attends meetings of clubs or organizations: Not on file    Relationship status: Not on file  Other Topics Concern  . Not on  file  Social History Narrative  . Not on file     Family History: The patient's family history includes Arthritis in his father; Heart Problems in his sister; Heart disease in his father; Other in his brother, daughter, mother, and sister; Suicidality in his brother.  ROS:   Please see the history of present illness.    ROS  All other systems reviewed and negative.   EKGs/Labs/Other Studies Reviewed:    The following studies were reviewed today: none  EKG:  EKG is not ordered today.   Recent Labs: 06/12/2018: BUN 15; Creatinine, Ser 0.88; Hemoglobin 7.4; Platelets 105; Potassium 5.1; Sodium 138   Recent Lipid Panel    Component Value Date/Time   CHOL  01/14/2008 0450    174        ATP III CLASSIFICATION:  <200     mg/dL   Desirable  200-239  mg/dL   Borderline High  >=240    mg/dL   High   TRIG 43 01/14/2008 0450   HDL 65 01/14/2008 0450   CHOLHDL 2.7 01/14/2008 0450   VLDL 9 01/14/2008 0450   LDLCALC (H) 01/14/2008 0450    100        Total Cholesterol/HDL:CHD Risk Coronary Heart Disease Risk Table                     Men   Women  1/2 Average Risk   3.4   3.3    Physical Exam:    VS:  BP 118/62   Pulse 60   Ht 5\' 3"  (1.6 m)   Wt 101 lb (45.8 kg)   SpO2 99%   BMI 17.89 kg/m     Wt Readings from Last 3 Encounters:  07/05/18 101 lb (45.8 kg)  06/14/18 99 lb 12.8 oz (45.3 kg)  06/12/18 99 lb 12.8 oz (45.3 kg)     GEN:  Well nourished, well developed in no acute distress HEENT: Normal NECK: No JVD;  No carotid bruits LYMPHATICS: No lymphadenopathy CARDIAC: RRR, no murmurs, rubs, gallops RESPIRATORY:  Clear to auscultation without rales, wheezing or rhonchi  ABDOMEN: Soft, non-tender, non-distended MUSCULOSKELETAL:  No edema; No deformity  SKIN: Warm and dry NEUROLOGIC:  Alert and oriented x 3 PSYCHIATRIC:  Normal affect   ASSESSMENT:    1. Severe aortic stenosis   2. Coronary artery disease involving native coronary artery of native heart without angina pectoris   3. Postoperative atrial fibrillation (HCC)    PLAN:    In order of problems listed above:  1.  Severe AS - s/p bioprosthetic AVR.  Echo 2017 with stable AVR (60mm Edwards tissue valve).    2.  ASCAD - nonobstructive by cath 2009 with 20-30% OM3.  He has not anginal sx.  He will continue on ASA 81mg  daily and BB.  3.  Postop afib - he has not had any reoccurrence since his AVR.   Medication Adjustments/Labs and Tests Ordered: Current medicines are reviewed at length with the patient today.  Concerns regarding medicines are outlined above.  No orders of the defined types were placed in this encounter.  No orders of the defined types were placed in this encounter.   Signed, Joshua Him, MD  07/05/2018 8:18 AM    [place]

## 2018-07-05 ENCOUNTER — Other Ambulatory Visit: Payer: Self-pay

## 2018-07-05 ENCOUNTER — Encounter: Payer: Self-pay | Admitting: Cardiology

## 2018-07-05 ENCOUNTER — Ambulatory Visit (INDEPENDENT_AMBULATORY_CARE_PROVIDER_SITE_OTHER): Payer: Medicare Other | Admitting: Cardiology

## 2018-07-05 VITALS — BP 118/62 | HR 60 | Ht 63.0 in | Wt 101.0 lb

## 2018-07-05 DIAGNOSIS — I4891 Unspecified atrial fibrillation: Secondary | ICD-10-CM | POA: Diagnosis not present

## 2018-07-05 DIAGNOSIS — I350 Nonrheumatic aortic (valve) stenosis: Secondary | ICD-10-CM | POA: Diagnosis not present

## 2018-07-05 DIAGNOSIS — I2510 Atherosclerotic heart disease of native coronary artery without angina pectoris: Secondary | ICD-10-CM

## 2018-07-05 DIAGNOSIS — I9789 Other postprocedural complications and disorders of the circulatory system, not elsewhere classified: Secondary | ICD-10-CM

## 2018-07-05 NOTE — Patient Instructions (Signed)

## 2018-07-15 ENCOUNTER — Other Ambulatory Visit: Payer: Self-pay | Admitting: Orthopedic Surgery

## 2018-07-15 DIAGNOSIS — Z96611 Presence of right artificial shoulder joint: Secondary | ICD-10-CM

## 2018-07-16 ENCOUNTER — Ambulatory Visit
Admission: RE | Admit: 2018-07-16 | Discharge: 2018-07-16 | Disposition: A | Discharge status: Home / Self Care | Payer: Medicare Other | Source: Ambulatory Visit | Attending: Orthopedic Surgery | Admitting: Orthopedic Surgery

## 2018-07-16 DIAGNOSIS — Z96611 Presence of right artificial shoulder joint: Secondary | ICD-10-CM

## 2018-09-11 ENCOUNTER — Other Ambulatory Visit: Payer: Self-pay | Admitting: Internal Medicine

## 2018-09-11 ENCOUNTER — Ambulatory Visit (HOSPITAL_COMMUNITY): Payer: Medicare Other | Attending: Cardiology

## 2018-09-11 DIAGNOSIS — I2510 Atherosclerotic heart disease of native coronary artery without angina pectoris: Secondary | ICD-10-CM | POA: Insufficient documentation

## 2018-09-11 DIAGNOSIS — R609 Edema, unspecified: Secondary | ICD-10-CM | POA: Diagnosis not present

## 2018-09-11 DIAGNOSIS — R600 Localized edema: Secondary | ICD-10-CM | POA: Diagnosis not present

## 2018-09-11 DIAGNOSIS — M069 Rheumatoid arthritis, unspecified: Secondary | ICD-10-CM | POA: Diagnosis not present

## 2018-09-11 DIAGNOSIS — I9789 Other postprocedural complications and disorders of the circulatory system, not elsewhere classified: Secondary | ICD-10-CM | POA: Insufficient documentation

## 2018-10-12 ENCOUNTER — Other Ambulatory Visit: Payer: Self-pay | Admitting: Internal Medicine

## 2018-10-12 DIAGNOSIS — R911 Solitary pulmonary nodule: Secondary | ICD-10-CM

## 2018-10-16 ENCOUNTER — Other Ambulatory Visit: Payer: Medicare Other

## 2018-11-13 ENCOUNTER — Other Ambulatory Visit: Payer: Self-pay | Admitting: Physician Assistant

## 2018-11-13 DIAGNOSIS — Z7952 Long term (current) use of systemic steroids: Secondary | ICD-10-CM

## 2018-11-19 ENCOUNTER — Encounter (HOSPITAL_COMMUNITY): Payer: Self-pay | Admitting: *Deleted

## 2018-11-19 ENCOUNTER — Other Ambulatory Visit: Payer: Self-pay

## 2018-11-19 NOTE — Progress Notes (Signed)
Spoke with patient reviewing his pre-op instructions.  Patient denies Chest pain, SOB, DM.  Hx of CAD, HTN and A-fib - none since 2016.  Cardiologist is Dr Golden Hurter.    Patient's wife or daughter will be here with him tomorrow.  Both deny any Fever, SOB, Congestion, Cough, N/V.

## 2018-11-19 NOTE — H&P (Signed)
Patient's anticipated LOS is less than 2 midnights, meeting these requirements: - Younger than 45 - Lives within 1 hour of care - Has a competent adult at home to recover with post-op recover - NO history of  - Chronic pain requiring opiods  - Diabetes  - Coronary Artery Disease  - Heart failure  - Heart attack  - Stroke  - DVT/VTE  - Cardiac arrhythmia  - Respiratory Failure/COPD  - Renal failure  - Anemia  - Advanced Liver disease       Joshua Hudson is an 84 y.o. male.    Chief Complaint: right shoulder pain  HPI: Pt is a 84 y.o. male complaining of right shoulder pain s/p reverse total shoulder. Pain had continually increased since the beginning and swelling has continued to worsen. Pt has tried various conservative treatments which have failed to alleviate their symptoms. Various options are discussed with the patient. Risks, benefits and expectations were discussed with the patient. Patient understand the risks, benefits and expectations and wishes to proceed with surgery.   PCP:  Lavone Orn, MD  D/C Plans: Home  PMH: Past Medical History:  Diagnosis Date   Anemia    Previous history of anemia   Colon polyps    s/p diverticular perforation requiring 2-stage repair   Coronary artery disease    a. 20-30% OM2 by cath 2009. b. nonobstructive of all 3 vessels by coronary CTA 2015.   Diverticulosis    DJD (degenerative joint disease), lumbosacral    Esophageal stricture    GERD (gastroesophageal reflux disease)    History of kidney stones    Mitral regurgitation 05/16/2016   mild by echo 05/2016   Occipital neuralgia    Pneumonia    Postoperative atrial fibrillation ([place]) 05/10/2015   Rheumatoid arthritis ([place])    s/o long term steroids   Rotator cuff arthropathy    right   S/P aortic valve replacement with tissue    SBE (subacute bacterial endocarditis) prophylaxis candidate    for dental procedures   Severe aortic stenosis    S/P  prosthetic valve replacement w 25 mm Edwards like science percardial tissue valve,Turner - 01/2008    PSH: Past Surgical History:  Procedure Laterality Date   APPENDECTOMY     BOTOX INJECTION N/A 01/22/2018   Procedure: BOTOX INJECTION;  Surgeon: Ronnette Juniper, MD;  [facility];  Service: Gastroenterology;  Laterality: N/A;   CARDIAC CATHETERIZATION     09   CARDIAC VALVE REPLACEMENT  01/2008   aortic valve replacement   CATARACT EXTRACTION W/ INTRAOCULAR LENS  IMPLANT, BILATERAL     COLON RESECTION     diverticulitis    ESOPHAGEAL MANOMETRY N/A 11/07/2017   Procedure: ESOPHAGEAL MANOMETRY (EM);  Surgeon: Ronnette Juniper, MD;  [facility];  Service: Gastroenterology;  Laterality: N/A;   ESOPHAGOGASTRODUODENOSCOPY (EGD) WITH PROPOFOL N/A 01/22/2018   Procedure: ESOPHAGOGASTRODUODENOSCOPY (EGD) WITH PROPOFOL;  Surgeon: Ronnette Juniper, MD;  [facility];  Service: Gastroenterology;  Laterality: N/A;   HERNIA REPAIR     LUMBAR LAMINECTOMY     x 2   REVERSE SHOULDER ARTHROPLASTY Right 03/01/2018   Procedure: RIGHT REVERSE SHOULDER ARTHROPLASTY;  Surgeon: Netta Cedars, MD;  Location: [place];  Service: Orthopedics;  Laterality: Right;   SAVORY DILATION N/A 01/22/2018   Procedure: SAVORY DILATION;  Surgeon: Ronnette Juniper, MD;  [facility];  Service: Gastroenterology;  Laterality: N/A;   SHOULDER HEMI-ARTHROPLASTY Right 06/14/2018   Procedure: RIGHT  REVERSE TOTAL SHOULDER OPEN  POLY EXCHANGE;  Surgeon: Netta Cedars, MD;  Location: [place];  Service: Orthopedics;  Laterality: Right;   TEE WITHOUT CARDIOVERSION N/A 01/29/2014   Procedure: TRANSESOPHAGEAL ECHOCARDIOGRAM (TEE);  Surgeon: Sueanne Margarita, MD;  Location[facility];  Service: Cardiovascular;  Laterality: N/A;    Social History:  reports that he quit smoking about 35 years ago. His smoking use included cigarettes. He started smoking about 44 years ago. He has a 5.00 pack-year smoking history. He  has quit using smokeless tobacco.  His smokeless tobacco use included chew. He reports previous alcohol use. He reports that he does not use drugs.  Allergies:  No Known Allergies  Medications: No current facility-administered medications for this encounter.    Current Outpatient Medications  Medication Sig Dispense Refill   aspirin EC 81 MG EC tablet Take 1 tablet (81 mg total) by mouth daily. 30 tablet    Cholecalciferol (VITAMIN D3 PO) Take 1 tablet by mouth daily.     ferrous sulfate 325 (65 FE) MG tablet Take 325 mg by mouth daily with breakfast.     folic acid (FOLVITE) 1 MG tablet Take 1 mg by mouth daily.     furosemide (LASIX) 40 MG tablet Take 40 mg by mouth daily.     leflunomide (ARAVA) 20 MG tablet Take 20 mg by mouth daily.     MEGARED OMEGA-3 KRILL OIL PO Take 1 capsule by mouth daily.     methotrexate (RHEUMATREX) 2.5 MG tablet Take 20 mg by mouth every Monday. In the morning. Caution:Chemotherapy. Protect from light.     metoprolol succinate (TOPROL-XL) 25 MG 24 hr tablet TAKE 1 TABLET BY MOUTH ONCE DAILY (Patient taking differently: Take 25 mg by mouth daily. ) 90 tablet 2   Multiple Vitamin (MULTIVITAMIN WITH MINERALS) TABS tablet Take 1 tablet by mouth daily.     Naphazoline HCl (CLEAR EYES OP) Place 1 drop into both eyes 3 (three) times daily as needed (for dry eyes).      pantoprazole (PROTONIX) 40 MG tablet Take 40 mg by mouth daily before breakfast.      potassium chloride (K-DUR) 10 MEQ tablet Take 10 mEq by mouth daily.     predniSONE (DELTASONE) 5 MG tablet Take 5 mg by mouth daily with breakfast.      tamsulosin (FLOMAX) 0.4 MG CAPS capsule Take 0.8 mg by mouth daily after lunch.     THERATEARS 0.25 % SOLN Place 1 drop into both eyes 3 (three) times daily as needed (dry/irritated eyes.).     TURMERIC PO Take 1 capsule by mouth daily.     vitamin B-12 (CYANOCOBALAMIN) 1000 MCG tablet Take 1,000 mcg by mouth daily.     ondansetron (ZOFRAN) 4 MG  tablet Take 1 tablet (4 mg total) by mouth every 6 (six) hours as needed for nausea or vomiting. (Patient not taking: Reported on 11/19/2018) 20 tablet 0   oxyCODONE (ROXICODONE) 5 MG immediate release tablet Take 1 tablet (5 mg total) by mouth every 4 (four) hours as needed for severe pain or breakthrough pain. (Patient not taking: Reported on 11/19/2018) 20 tablet 0   traMADol (ULTRAM) 50 MG tablet Take 1-2 tablets (50-100 mg total) by mouth every 6 (six) hours as needed for moderate pain or severe pain. (Patient not taking: Reported on 11/19/2018) 40 tablet 0    No results found for this or any previous visit (from the past 48 hour(s)). No results found.  ROS: Pain with rom of the right upper  extremity  Physical Exam: Alert and oriented 84 y.o. male in no acute distress Cranial nerves 2-12 intact Cervical spine: full rom with no tenderness, nv intact distally Chest: active breath sounds bilaterally, no wheeze rhonchi or rales Heart: regular rate and rhythm, no murmur Abd: non tender non distended with active bowel sounds Hip is stable with rom  Right shoulder with mild to moderate effusion Painful rom nv intact distally No rashes or edema  Assessment/Plan Assessment: right shoulder infection s/p reverse total  Plan:  Patient will undergo a right shoulder I&D with poly exchange by Dr. Veverly Fells at [facility]. Risks benefits and expectations were discussed with the patient. Patient understand risks, benefits and expectations and wishes to proceed. Preoperative templating of the joint replacement has been completed, documented, and submitted to the Operating Room personnel in order to optimize intra-operative equipment management.   Merla Riches PA-C, MPAS [facility] is now [facility] [employer] [address] [place], [place], [place] [address] Phone: [phone] [email or web address]   [employer]

## 2018-11-20 ENCOUNTER — Inpatient Hospital Stay (HOSPITAL_COMMUNITY): Payer: Medicare Other | Admitting: Certified Registered Nurse Anesthetist

## 2018-11-20 ENCOUNTER — Other Ambulatory Visit: Payer: Self-pay

## 2018-11-20 ENCOUNTER — Encounter (HOSPITAL_COMMUNITY): Payer: Self-pay

## 2018-11-20 ENCOUNTER — Inpatient Hospital Stay (HOSPITAL_COMMUNITY)
Admission: RE | Admit: 2018-11-20 | Discharge: 2018-11-21 | DRG: 483 | Disposition: A | Discharge status: Home / Self Care | Payer: Medicare Other | Attending: Orthopedic Surgery | Admitting: Orthopedic Surgery

## 2018-11-20 ENCOUNTER — Encounter (HOSPITAL_COMMUNITY)
Admission: RE | Disposition: A | Discharge status: Home / Self Care | Payer: Self-pay | Source: Home / Self Care | Attending: Orthopedic Surgery

## 2018-11-20 DIAGNOSIS — Z87891 Personal history of nicotine dependence: Secondary | ICD-10-CM

## 2018-11-20 DIAGNOSIS — Z7952 Long term (current) use of systemic steroids: Secondary | ICD-10-CM

## 2018-11-20 DIAGNOSIS — Z7982 Long term (current) use of aspirin: Secondary | ICD-10-CM

## 2018-11-20 DIAGNOSIS — M5481 Occipital neuralgia: Secondary | ICD-10-CM | POA: Diagnosis present

## 2018-11-20 DIAGNOSIS — I2510 Atherosclerotic heart disease of native coronary artery without angina pectoris: Secondary | ICD-10-CM | POA: Diagnosis present

## 2018-11-20 DIAGNOSIS — T8459XA Infection and inflammatory reaction due to other internal joint prosthesis, initial encounter: Principal | ICD-10-CM | POA: Diagnosis present

## 2018-11-20 DIAGNOSIS — K219 Gastro-esophageal reflux disease without esophagitis: Secondary | ICD-10-CM | POA: Diagnosis present

## 2018-11-20 DIAGNOSIS — I509 Heart failure, unspecified: Secondary | ICD-10-CM | POA: Diagnosis present

## 2018-11-20 DIAGNOSIS — Y831 Surgical operation with implant of artificial internal device as the cause of abnormal reaction of the patient, or of later complication, without mention of misadventure at the time of the procedure: Secondary | ICD-10-CM | POA: Diagnosis present

## 2018-11-20 DIAGNOSIS — M47817 Spondylosis without myelopathy or radiculopathy, lumbosacral region: Secondary | ICD-10-CM | POA: Diagnosis present

## 2018-11-20 DIAGNOSIS — D62 Acute posthemorrhagic anemia: Secondary | ICD-10-CM | POA: Diagnosis not present

## 2018-11-20 DIAGNOSIS — I340 Nonrheumatic mitral (valve) insufficiency: Secondary | ICD-10-CM | POA: Diagnosis present

## 2018-11-20 DIAGNOSIS — Z953 Presence of xenogenic heart valve: Secondary | ICD-10-CM

## 2018-11-20 DIAGNOSIS — M009 Pyogenic arthritis, unspecified: Secondary | ICD-10-CM | POA: Diagnosis present

## 2018-11-20 DIAGNOSIS — D649 Anemia, unspecified: Secondary | ICD-10-CM | POA: Diagnosis present

## 2018-11-20 DIAGNOSIS — Y792 Prosthetic and other implants, materials and accessory orthopedic devices associated with adverse incidents: Secondary | ICD-10-CM | POA: Diagnosis present

## 2018-11-20 DIAGNOSIS — Z79899 Other long term (current) drug therapy: Secondary | ICD-10-CM

## 2018-11-20 DIAGNOSIS — I110 Hypertensive heart disease with heart failure: Secondary | ICD-10-CM | POA: Diagnosis present

## 2018-11-20 DIAGNOSIS — M069 Rheumatoid arthritis, unspecified: Secondary | ICD-10-CM | POA: Diagnosis present

## 2018-11-20 HISTORY — DX: Personal history of other medical treatment: Z92.89

## 2018-11-20 HISTORY — PX: IRRIGATION AND DEBRIDEMENT SHOULDER: SHX5880

## 2018-11-20 HISTORY — DX: Essential (primary) hypertension: I10

## 2018-11-20 HISTORY — DX: Heart failure, unspecified: I50.9

## 2018-11-20 LAB — SEDIMENTATION RATE: Sed Rate: 52 mm/hr — ABNORMAL HIGH (ref 0–16)

## 2018-11-20 LAB — CBC
HCT: 24.6 % — ABNORMAL LOW (ref 39.0–52.0)
Hemoglobin: 7.7 g/dL — ABNORMAL LOW (ref 13.0–17.0)
MCH: 21.2 pg — AB (ref 26.0–34.0)
MCHC: 31.3 g/dL (ref 30.0–36.0)
MCV: 67.8 fL — ABNORMAL LOW (ref 80.0–100.0)
Platelets: 236 10*3/uL (ref 150–400)
RBC: 3.63 MIL/uL — ABNORMAL LOW (ref 4.22–5.81)
RDW: 17.2 % — ABNORMAL HIGH (ref 11.5–15.5)
WBC: 6.3 10*3/uL (ref 4.0–10.5)
nRBC: 0 % (ref 0.0–0.2)

## 2018-11-20 LAB — BASIC METABOLIC PANEL
Anion gap: 10 (ref 5–15)
BUN: 17 mg/dL (ref 8–23)
CO2: 26 mmol/L (ref 22–32)
Calcium: 9.2 mg/dL (ref 8.9–10.3)
Chloride: 104 mmol/L (ref 98–111)
Creatinine, Ser: 0.96 mg/dL (ref 0.61–1.24)
GFR calc Af Amer: >60 mL/min (ref >60)
GFR calc non Af Amer: >60 mL/min (ref >60)
GLUCOSE: 83 mg/dL (ref 70–99)
Potassium: 3.8 mmol/L (ref 3.5–5.1)
Sodium: 140 mmol/L (ref 135–145)

## 2018-11-20 LAB — PREPARE RBC (CROSSMATCH)

## 2018-11-20 LAB — C-REACTIVE PROTEIN: CRP: 11.2 mg/dL — ABNORMAL HIGH (ref <1.0)

## 2018-11-20 SURGERY — IRRIGATION AND DEBRIDEMENT SHOULDER
Anesthesia: General | Site: Shoulder | Laterality: Right

## 2018-11-20 MED ORDER — FUROSEMIDE 10 MG/ML IJ SOLN
20.0000 mg | Freq: Once | INTRAMUSCULAR | Status: AC
  Administered 2018-11-20: 20 mg via INTRAVENOUS
  Filled 2018-11-20 (×2): qty 2

## 2018-11-20 MED ORDER — LIDOCAINE 2% (20 MG/ML) 5 ML SYRINGE
INTRAMUSCULAR | Status: DC | PRN
  Administered 2018-11-20: 60 mg via INTRAVENOUS

## 2018-11-20 MED ORDER — TOBRAMYCIN SULFATE 1.2 G IJ SOLR
INTRAMUSCULAR | Status: DC | PRN
  Administered 2018-11-20: 1.2 g

## 2018-11-20 MED ORDER — METOCLOPRAMIDE HCL 5 MG/ML IJ SOLN: 5.0000 mg | Freq: Three times a day (TID) | INTRAMUSCULAR | Status: DC | PRN

## 2018-11-20 MED ORDER — OXYCODONE HCL 5 MG PO TABS: 5.0000 mg | ORAL_TABLET | ORAL | 0 refills | Status: DC | PRN

## 2018-11-20 MED ORDER — LACTATED RINGERS IV SOLN
INTRAVENOUS | Status: DC
  Administered 2018-11-20: 10:00:00 via INTRAVENOUS

## 2018-11-20 MED ORDER — SODIUM CHLORIDE 0.9 % IV SOLN
INTRAVENOUS | Status: DC | PRN
  Administered 2018-11-20: 25 ug/min via INTRAVENOUS

## 2018-11-20 MED ORDER — ONDANSETRON HCL 4 MG/2ML IJ SOLN
INTRAMUSCULAR | Status: DC | PRN
  Administered 2018-11-20: 4 mg via INTRAVENOUS

## 2018-11-20 MED ORDER — LEFLUNOMIDE 20 MG PO TABS
20.0000 mg | ORAL_TABLET | Freq: Every day | ORAL | Status: DC
  Administered 2018-11-20 – 2018-11-21 (×2): 20 mg via ORAL
  Filled 2018-11-20 (×2): qty 1

## 2018-11-20 MED ORDER — FUROSEMIDE 40 MG PO TABS
40.0000 mg | ORAL_TABLET | Freq: Every day | ORAL | Status: DC
  Administered 2018-11-21: 40 mg via ORAL
  Filled 2018-11-20: qty 1

## 2018-11-20 MED ORDER — POLYETHYLENE GLYCOL 3350 17 G PO PACK: 17.0000 g | PACK | Freq: Every day | ORAL | Status: DC | PRN

## 2018-11-20 MED ORDER — SUCCINYLCHOLINE CHLORIDE 200 MG/10ML IV SOSY
PREFILLED_SYRINGE | INTRAVENOUS | Status: AC
  Filled 2018-11-20: qty 20

## 2018-11-20 MED ORDER — SODIUM CHLORIDE 0.9 % IV SOLN
INTRAVENOUS | Status: DC
  Administered 2018-11-20: 16:00:00 via INTRAVENOUS

## 2018-11-20 MED ORDER — OXYCODONE HCL 5 MG PO TABS: 5.0000 mg | ORAL_TABLET | Freq: Once | ORAL | Status: DC | PRN

## 2018-11-20 MED ORDER — METOPROLOL SUCCINATE ER 25 MG PO TB24
25.0000 mg | ORAL_TABLET | Freq: Every day | ORAL | Status: DC
  Administered 2018-11-21: 25 mg via ORAL
  Filled 2018-11-20: qty 1

## 2018-11-20 MED ORDER — ASPIRIN EC 81 MG PO TBEC
81.0000 mg | DELAYED_RELEASE_TABLET | Freq: Every day | ORAL | Status: DC
  Administered 2018-11-21: 81 mg via ORAL
  Filled 2018-11-20: qty 1

## 2018-11-20 MED ORDER — VANCOMYCIN HCL 1000 MG IV SOLR
INTRAVENOUS | Status: AC
  Filled 2018-11-20: qty 1000

## 2018-11-20 MED ORDER — PROPOFOL 10 MG/ML IV BOLUS
INTRAVENOUS | Status: AC
  Filled 2018-11-20: qty 20

## 2018-11-20 MED ORDER — ONDANSETRON HCL 4 MG/2ML IJ SOLN: 4.0000 mg | Freq: Once | INTRAMUSCULAR | Status: DC | PRN

## 2018-11-20 MED ORDER — OXYCODONE HCL 5 MG PO TABS: 5.0000 mg | ORAL_TABLET | ORAL | Status: DC | PRN

## 2018-11-20 MED ORDER — ROCURONIUM BROMIDE 50 MG/5ML IV SOSY
PREFILLED_SYRINGE | INTRAVENOUS | Status: AC
  Filled 2018-11-20: qty 15

## 2018-11-20 MED ORDER — LIDOCAINE 2% (20 MG/ML) 5 ML SYRINGE
INTRAMUSCULAR | Status: AC
  Filled 2018-11-20: qty 5

## 2018-11-20 MED ORDER — CEFAZOLIN SODIUM-DEXTROSE 2-4 GM/100ML-% IV SOLN
INTRAVENOUS | Status: AC
  Filled 2018-11-20: qty 100

## 2018-11-20 MED ORDER — FOLIC ACID 1 MG PO TABS
1.0000 mg | ORAL_TABLET | Freq: Every day | ORAL | Status: DC
  Administered 2018-11-20 – 2018-11-21 (×2): 1 mg via ORAL
  Filled 2018-11-20 (×2): qty 1

## 2018-11-20 MED ORDER — CEFAZOLIN SODIUM-DEXTROSE 2-3 GM-%(50ML) IV SOLR
INTRAVENOUS | Status: DC | PRN
  Administered 2018-11-20: 2 g via INTRAVENOUS

## 2018-11-20 MED ORDER — HYDROMORPHONE HCL 1 MG/ML IJ SOLN
0.5000 mg | INTRAMUSCULAR | Status: DC | PRN
  Administered 2018-11-20 – 2018-11-21 (×3): 1 mg via INTRAVENOUS
  Filled 2018-11-20 (×3): qty 1

## 2018-11-20 MED ORDER — CARBOXYMETHYLCELLULOSE SODIUM 0.25 % OP SOLN: 1.0000 [drp] | Freq: Three times a day (TID) | OPHTHALMIC | Status: DC | PRN

## 2018-11-20 MED ORDER — FERROUS SULFATE 325 (65 FE) MG PO TABS
325.0000 mg | ORAL_TABLET | Freq: Every day | ORAL | Status: DC
  Administered 2018-11-21: 325 mg via ORAL
  Filled 2018-11-20: qty 1

## 2018-11-20 MED ORDER — BISACODYL 10 MG RE SUPP: 10.0000 mg | Freq: Every day | RECTAL | Status: DC | PRN

## 2018-11-20 MED ORDER — MENTHOL 3 MG MT LOZG: 1.0000 | LOZENGE | OROMUCOSAL | Status: DC | PRN

## 2018-11-20 MED ORDER — OXYCODONE HCL 5 MG/5ML PO SOLN: 5.0000 mg | Freq: Once | ORAL | Status: DC | PRN

## 2018-11-20 MED ORDER — CEFAZOLIN SODIUM-DEXTROSE 2-4 GM/100ML-% IV SOLN
2.0000 g | Freq: Four times a day (QID) | INTRAVENOUS | Status: AC
  Administered 2018-11-20 – 2018-11-21 (×3): 2 g via INTRAVENOUS
  Filled 2018-11-20 (×3): qty 100

## 2018-11-20 MED ORDER — DIPHENHYDRAMINE HCL 25 MG PO CAPS
25.0000 mg | ORAL_CAPSULE | Freq: Once | ORAL | Status: AC
  Administered 2018-11-20: 25 mg via ORAL
  Filled 2018-11-20 (×2): qty 1

## 2018-11-20 MED ORDER — SODIUM CHLORIDE 0.9 % IR SOLN
Status: DC | PRN
  Administered 2018-11-20: 3000 mL

## 2018-11-20 MED ORDER — PANTOPRAZOLE SODIUM 40 MG PO TBEC
40.0000 mg | DELAYED_RELEASE_TABLET | Freq: Every day | ORAL | Status: DC
  Administered 2018-11-21: 40 mg via ORAL
  Filled 2018-11-20: qty 1

## 2018-11-20 MED ORDER — TAMSULOSIN HCL 0.4 MG PO CAPS: 0.8000 mg | ORAL_CAPSULE | Freq: Every day | ORAL | Status: DC

## 2018-11-20 MED ORDER — FENTANYL CITRATE (PF) 100 MCG/2ML IJ SOLN
INTRAMUSCULAR | Status: AC
  Administered 2018-11-20: 50 ug via INTRAVENOUS
  Filled 2018-11-20: qty 2

## 2018-11-20 MED ORDER — ACETAMINOPHEN 325 MG PO TABS: 325.0000 mg | ORAL_TABLET | Freq: Four times a day (QID) | ORAL | Status: DC | PRN

## 2018-11-20 MED ORDER — PHENOL 1.4 % MT LIQD: 1.0000 | OROMUCOSAL | Status: DC | PRN

## 2018-11-20 MED ORDER — FENTANYL CITRATE (PF) 100 MCG/2ML IJ SOLN
INTRAMUSCULAR | Status: DC | PRN
  Administered 2018-11-20: 100 ug via INTRAVENOUS

## 2018-11-20 MED ORDER — ONDANSETRON HCL 4 MG/2ML IJ SOLN: 4.0000 mg | Freq: Four times a day (QID) | INTRAMUSCULAR | Status: DC | PRN

## 2018-11-20 MED ORDER — SODIUM CHLORIDE 0.9% IV SOLUTION: Freq: Once | INTRAVENOUS | Status: DC

## 2018-11-20 MED ORDER — ONDANSETRON HCL 4 MG PO TABS: 4.0000 mg | ORAL_TABLET | Freq: Four times a day (QID) | ORAL | Status: DC | PRN

## 2018-11-20 MED ORDER — METHOTREXATE 2.5 MG PO TABS: 20.0000 mg | ORAL_TABLET | ORAL | Status: DC

## 2018-11-20 MED ORDER — METOCLOPRAMIDE HCL 5 MG PO TABS: 5.0000 mg | ORAL_TABLET | Freq: Three times a day (TID) | ORAL | Status: DC | PRN

## 2018-11-20 MED ORDER — VITAMIN B-12 1000 MCG PO TABS
1000.0000 ug | ORAL_TABLET | Freq: Every day | ORAL | Status: DC
  Administered 2018-11-21: 1000 ug via ORAL
  Filled 2018-11-20: qty 1

## 2018-11-20 MED ORDER — VANCOMYCIN HCL 1000 MG IV SOLR
INTRAVENOUS | Status: DC | PRN
  Administered 2018-11-20: 1000 mg

## 2018-11-20 MED ORDER — POTASSIUM CHLORIDE CRYS ER 10 MEQ PO TBCR
10.0000 meq | EXTENDED_RELEASE_TABLET | Freq: Every day | ORAL | Status: DC
  Administered 2018-11-20 – 2018-11-21 (×2): 10 meq via ORAL
  Filled 2018-11-20 (×3): qty 1

## 2018-11-20 MED ORDER — FENTANYL CITRATE (PF) 250 MCG/5ML IJ SOLN
INTRAMUSCULAR | Status: AC
  Filled 2018-11-20: qty 5

## 2018-11-20 MED ORDER — CHLORHEXIDINE GLUCONATE 4 % EX LIQD: 60.0000 mL | Freq: Once | CUTANEOUS | Status: DC

## 2018-11-20 MED ORDER — ONDANSETRON HCL 4 MG/2ML IJ SOLN
INTRAMUSCULAR | Status: AC
  Filled 2018-11-20: qty 2

## 2018-11-20 MED ORDER — BUPIVACAINE-EPINEPHRINE (PF) 0.25% -1:200000 IJ SOLN
INTRAMUSCULAR | Status: AC
  Filled 2018-11-20: qty 30

## 2018-11-20 MED ORDER — SUCCINYLCHOLINE CHLORIDE 200 MG/10ML IV SOSY
PREFILLED_SYRINGE | INTRAVENOUS | Status: DC | PRN
  Administered 2018-11-20: 70 mg via INTRAVENOUS

## 2018-11-20 MED ORDER — TOBRAMYCIN SULFATE 1.2 G IJ SOLR
INTRAMUSCULAR | Status: AC
  Filled 2018-11-20: qty 1.2

## 2018-11-20 MED ORDER — PROPOFOL 10 MG/ML IV BOLUS
INTRAVENOUS | Status: DC | PRN
  Administered 2018-11-20: 100 mg via INTRAVENOUS

## 2018-11-20 MED ORDER — DOCUSATE SODIUM 100 MG PO CAPS
100.0000 mg | ORAL_CAPSULE | Freq: Two times a day (BID) | ORAL | Status: DC
  Administered 2018-11-20 – 2018-11-21 (×2): 100 mg via ORAL
  Filled 2018-11-20 (×2): qty 1

## 2018-11-20 MED ORDER — BUPIVACAINE-EPINEPHRINE 0.25% -1:200000 IJ SOLN
INTRAMUSCULAR | Status: DC | PRN
  Administered 2018-11-20: 8 mL

## 2018-11-20 MED ORDER — FENTANYL CITRATE (PF) 100 MCG/2ML IJ SOLN
25.0000 ug | INTRAMUSCULAR | Status: DC | PRN
  Administered 2018-11-20 (×2): 50 ug via INTRAVENOUS

## 2018-11-20 MED ORDER — ADULT MULTIVITAMIN W/MINERALS CH
1.0000 | ORAL_TABLET | Freq: Every day | ORAL | Status: DC
  Administered 2018-11-21: 1 via ORAL
  Filled 2018-11-20: qty 1

## 2018-11-20 MED ORDER — PREDNISONE 5 MG PO TABS
5.0000 mg | ORAL_TABLET | Freq: Every day | ORAL | Status: DC
  Administered 2018-11-21: 5 mg via ORAL
  Filled 2018-11-20: qty 1

## 2018-11-20 SURGICAL SUPPLY — 57 items
COVER SURGICAL LIGHT HANDLE (MISCELLANEOUS) ×3 IMPLANT
COVER WAND RF STERILE (DRAPES) ×3 IMPLANT
DRAPE INCISE IOBAN 66X45 STRL (DRAPES) ×6 IMPLANT
DRAPE U-SHAPE 47X51 STRL (DRAPES) ×3 IMPLANT
DRSG EMULSION OIL 3X3 NADH (GAUZE/BANDAGES/DRESSINGS) ×3 IMPLANT
DRSG PAD ABDOMINAL 8X10 ST (GAUZE/BANDAGES/DRESSINGS) ×6 IMPLANT
DURAPREP 26ML APPLICATOR (WOUND CARE) ×3 IMPLANT
ELECT REM PT RETURN 9FT ADLT (ELECTROSURGICAL) ×3
ELECTRODE REM PT RTRN 9FT ADLT (ELECTROSURGICAL) ×1 IMPLANT
EVACUATOR 1/8 PVC DRAIN (DRAIN) ×3 IMPLANT
GAUZE SPONGE 4X4 12PLY STRL (GAUZE/BANDAGES/DRESSINGS) ×3 IMPLANT
GLOVE BIOGEL PI ORTHO PRO 7.5 (GLOVE) ×2
GLOVE BIOGEL PI ORTHO PRO SZ8 (GLOVE) ×2
GLOVE ORTHO TXT STRL SZ7.5 (GLOVE) ×3 IMPLANT
GLOVE PI ORTHO PRO STRL 7.5 (GLOVE) ×1 IMPLANT
GLOVE PI ORTHO PRO STRL SZ8 (GLOVE) ×1 IMPLANT
GLOVE SURG ORTHO 8.5 STRL (GLOVE) ×3 IMPLANT
GOWN STRL REUS W/ TWL LRG LVL3 (GOWN DISPOSABLE) ×1 IMPLANT
GOWN STRL REUS W/ TWL XL LVL3 (GOWN DISPOSABLE) ×4 IMPLANT
GOWN STRL REUS W/TWL LRG LVL3 (GOWN DISPOSABLE) ×2
GOWN STRL REUS W/TWL XL LVL3 (GOWN DISPOSABLE) ×8
GRAFT BONE SUB CALCIGEN 10 (Bone Implant) ×3 IMPLANT
GRAFT BONE SUB CALCIGEN 5 (Bone Implant) ×3 IMPLANT
HANDPIECE INTERPULSE COAX TIP (DISPOSABLE) ×2
INSERT SHOULDER REVERSED (Miscellaneous) ×1 IMPLANT
KIT BASIN OR (CUSTOM PROCEDURE TRAY) ×3 IMPLANT
KIT TURNOVER KIT B (KITS) ×3 IMPLANT
MANIFOLD NEPTUNE II (INSTRUMENTS) ×3 IMPLANT
NS IRRIG 1000ML POUR BTL (IV SOLUTION) ×3 IMPLANT
PACK SHOULDER (CUSTOM PROCEDURE TRAY) ×3 IMPLANT
PAD ARMBOARD 7.5X6 YLW CONV (MISCELLANEOUS) ×6 IMPLANT
REVERSED INSERT SHOULDER (Miscellaneous) ×3 IMPLANT
SET HNDPC FAN SPRY TIP SCT (DISPOSABLE) ×1 IMPLANT
SLING ARM FOAM STRAP LRG (SOFTGOODS) ×3 IMPLANT
SLING ARM FOAM STRAP MED (SOFTGOODS) ×3 IMPLANT
SPONGE LAP 18X18 RF (DISPOSABLE) ×3 IMPLANT
STAPLER VISISTAT 35W (STAPLE) ×3 IMPLANT
SUT FIBERWIRE #2 38 T-5 BLUE (SUTURE)
SUT MNCRL AB 3-0 PS2 18 (SUTURE) ×3 IMPLANT
SUT PDS AB 0 CT 36 (SUTURE) ×3 IMPLANT
SUT PDS AB 1 CT  36 (SUTURE) ×2
SUT PDS AB 1 CT 36 (SUTURE) ×1 IMPLANT
SUT VIC AB 0 CT1 27 (SUTURE) ×2
SUT VIC AB 0 CT1 27XBRD ANBCTR (SUTURE) ×1 IMPLANT
SUT VIC AB 2-0 CT1 27 (SUTURE) ×2
SUT VIC AB 2-0 CT1 TAPERPNT 27 (SUTURE) ×1 IMPLANT
SUTURE FIBERWR #2 38 T-5 BLUE (SUTURE) IMPLANT
SWAB COLLECTION DEVICE MRSA (MISCELLANEOUS) ×3 IMPLANT
SWAB CULTURE ESWAB REG 1ML (MISCELLANEOUS) IMPLANT
TOWEL OR 17X24 6PK STRL BLUE (TOWEL DISPOSABLE) ×3 IMPLANT
TOWEL OR 17X26 10 PK STRL BLUE (TOWEL DISPOSABLE) ×3 IMPLANT
TRAY BONE MOLDING CALCIGEN (MISCELLANEOUS) ×3 IMPLANT
TUBE CONNECTING 12'X1/4 (SUCTIONS) ×1
TUBE CONNECTING 12X1/4 (SUCTIONS) ×2 IMPLANT
UNDERPAD 30X30 (UNDERPADS AND DIAPERS) ×3 IMPLANT
WATER STERILE IRR 1000ML POUR (IV SOLUTION) ×3 IMPLANT
YANKAUER SUCT BULB TIP NO VENT (SUCTIONS) ×3 IMPLANT

## 2018-11-20 NOTE — Op Note (Signed)
NAME: Joshua Hudson, Joshua Hudson MEDICAL RECORD [id number] ACCOUNT [id number] DATE OF BIRTH:June 12, 1935 FACILITY: [facility] LOCATION: [facility] PHYSICIAN:STEVEN Orlena Sheldon, MD  OPERATIVE REPORT  DATE OF PROCEDURE:  11/20/2018  PREOPERATIVE DIAGNOSIS:  Right shoulder presumptive infection after reverse total shoulder arthroplasty.  POSTOPERATIVE DIAGNOSIS:  Right shoulder swelling and possible instability  after reverse shoulder arthroplasty.  PROCEDURE PERFORMED:  Right shoulder irrigation and debridement with polyethylene exchange and deep cultures.  ATTENDING SURGEON:  Carlota Raspberry, MD  ASSISTANT:  Darol Destine, [place], who was scrubbed during the entire procedure and necessary for satisfactory completion of surgery.  ANESTHESIA:  General anesthesia plus local.  ESTIMATED BLOOD LOSS:  Less than 100 mL.  FLUID REPLACEMENT:  1000 mL crystalloid.  INSTRUMENT COUNTS:  Correct.  COMPLICATIONS:  No complications.  ANTIBIOTICS:  Perioperative antibiotics were administered after cultures were obtained.  INDICATIONS:  The patient is an 84 year old male with a history of a right shoulder reverse total shoulder arthroplasty for rotator cuff tear arthropathy.  The patient had postoperative instability that was treated with a polyethylene exchange and  building up to the humeral side.  The patient did well until approximately 3-4 weeks ago and began having recurrent swelling and pain in the shoulder.  The patient had an aspiration performed last week which revealed 52,000 white cells with 94%  neutrophils consistent with infection.  The patient presents now for I and D and polyethylene exchange and placement of antibiotic beads.  Risks and benefits of surgical treatment discussed in detail with the patient.  Informed consent obtained.  DESCRIPTION OF PROCEDURE:  After an adequate level of anesthesia was achieved, the patient was positioned in modified beach chair position.  Right shoulder correctly  identified and sterilely prepped and draped in the usual manner.  The patient's shoulder  was dislocated going off to sleep.  I was able to manually relocate the shoulder.  After sterile prep and drape and timeout verifying correct patient, correct site we entered the patient's prior deltopectoral incision with a 10 blade scalpel.   Dissection down through subcutaneous tissues.  We identified the deltopectoral interval, divided that with a Mayo scissors.  As we entered into that deeper soft tissue space, there was amber-colored fluid that was expressed.  This was clear.  We did go  ahead and culture that for aerobic, anaerobic culture and Gram stain.  We also once we developed an interval and we were able to dislocate the shoulder, we obtained some deep tissue, cultured and this will be sent to micro for Gram stain and culture.  We  did a thorough sharp debridement and irrigation with a total of 6 liters normal saline irrigation, both located and dislocated.  The shoulder actually was fairly stable.  Once the shoulder was located again, there was no leveraging, no soft tissue  impingement, but with enough traction, I could definitely pull it out.  We went ahead and removed the poly, irrigated thoroughly did a trial with a  39+12 instead of a +9 and it felt quite stable, so we selected the real +12.  We did irrigate with a  special antibacterial irrigation from [employer] which was an acetic acid based irrigation.  We finished with 3 liters normal saline, had a thorough irrigation,  all debris in the shoulder was removed.  Basically, it was a mucinous material that looked a  little more like an inflammation, little less like infection to me upon gross examination, the shoulder was stable.  We irrigated thoroughly.  We then  did a single layer of the deep closure, which was a deltopectoral with #1 PDS and then staples for skin  and the patient was transported to the recovery room in stable condition in a shoulder  sling having tolerated surgery well.    He will be placed on empiric antibiotics pending results of the cultures.  AN/NUANCE  D:11/20/2018 T:11/20/2018 JOB:[id number]

## 2018-11-20 NOTE — Anesthesia Procedure Notes (Signed)
Procedure Name: Intubation Date/Time: 11/20/2018 12:16 PM Performed by: Kyung Rudd, CRNA Pre-anesthesia Checklist: Patient identified, Emergency Drugs available, Suction available and Patient being monitored Patient Re-evaluated:Patient Re-evaluated prior to induction Oxygen Delivery Method: Circle system utilized Preoxygenation: Pre-oxygenation with 100% oxygen Induction Type: Rapid sequence Laryngoscope Size: Mac and 4 Grade View: Grade I Tube type: Oral Tube size: 7.5 mm Number of attempts: 1 Airway Equipment and Method: Stylet Placement Confirmation: ETT inserted through vocal cords under direct vision,  positive ETCO2 and breath sounds checked- equal and bilateral Secured at: 21 cm Tube secured with: Tape Dental Injury: Teeth and Oropharynx as per pre-operative assessment

## 2018-11-20 NOTE — Plan of Care (Signed)

## 2018-11-20 NOTE — Transfer of Care (Signed)
Immediate Anesthesia Transfer of Care Note  Patient: Joshua Hudson  Procedure(s) Performed: IRRIGATION AND DEBRIDEMENT SHOULDER WITH POLY EXCHANGE (Right Shoulder)  Patient Location: PACU  Anesthesia Type:General  Level of Consciousness: awake, alert  and oriented  Airway & Oxygen Therapy: Patient Spontanous Breathing and Patient connected to nasal cannula oxygen  Post-op Assessment: Report given to RN and Post -op Vital signs reviewed and stable  Post vital signs: Reviewed and stable  Last Vitals:  Vitals Value Taken Time  BP 108/62 11/20/2018  1:50 PM  Temp    Pulse 69 11/20/2018  1:57 PM  Resp 16 11/20/2018  1:57 PM  SpO2 97 % 11/20/2018  1:57 PM  Vitals shown include unvalidated device data.  Last Pain:  Vitals:   11/20/18 0959  TempSrc:   PainSc: 10-Worst pain ever      Patients Stated Pain Goal: 2 (82/50/53 9767)  Complications: No apparent anesthesia complications

## 2018-11-20 NOTE — Interval H&P Note (Signed)
History and Physical Interval Note:  11/20/2018 12:05 PM  Joshua Hudson  has presented today for surgery, with the diagnosis of infected right shoulder.  The various methods of treatment have been discussed with the patient and family. After consideration of risks, benefits and other options for treatment, the patient has consented to  Procedure(s): IRRIGATION AND DEBRIDEMENT SHOULDER (Right) as a surgical intervention.  The patient's history has been reviewed, patient examined, no change in status, stable for surgery.  I have reviewed the patient's chart and labs.  Questions were answered to the patient's satisfaction.     [name]

## 2018-11-20 NOTE — Anesthesia Postprocedure Evaluation (Signed)
Anesthesia Post Note  Patient: [name]  Procedure(s) Performed: IRRIGATION AND DEBRIDEMENT SHOULDER WITH POLY EXCHANGE (Right Shoulder)     Patient location during evaluation: PACU Anesthesia Type: General Level of consciousness: awake and alert Pain management: pain level controlled Vital Signs Assessment: post-procedure vital signs reviewed and stable Respiratory status: spontaneous breathing, nonlabored ventilation, respiratory function stable and patient connected to nasal cannula oxygen Cardiovascular status: blood pressure returned to baseline and stable Postop Assessment: no apparent nausea or vomiting Anesthetic complications: no    Last Vitals:  Vitals:   11/20/18 1435 11/20/18 1505  BP: (!) 103/59 99/63  Pulse: 73 (!) 108  Resp: 20   Temp:  36.6 C  SpO2: 99% 100%    Last Pain:  Vitals:   11/20/18 1505  TempSrc: Oral  PainSc:                  Braylen Staller COKER

## 2018-11-20 NOTE — Discharge Instructions (Signed)
Ice to the shoulder constantly.  Keep the incision covered and clean and dry for one week, then ok to get it wet in the shower. ° °Do exercise as instructed several times per day. ° °DO NOT reach behind your back or push up out of a chair with the operative arm. ° °Use a sling while you are up and around for comfort, may remove while seated.  Keep pillow propped behind the operative elbow. ° °Follow up with Dr Bronsen Serano in two weeks in the office, call [phone] for appt °

## 2018-11-20 NOTE — Anesthesia Preprocedure Evaluation (Signed)

## 2018-11-20 NOTE — Brief Op Note (Signed)
11/20/2018  1:34 PM  PATIENT:  [name]  84 y.o. male  PRE-OPERATIVE DIAGNOSIS:  infected right shoulder  POST-OPERATIVE DIAGNOSIS:  right shoulder swelling after TSA-R, possible infection versus instability  PROCEDURE:  Procedure(s): IRRIGATION AND DEBRIDEMENT SHOULDER WITH POLY EXCHANGE (Right) Deep Cultures  SURGEON:  Surgeon(s) and Role:    Netta Cedars, MD - Primary  PHYSICIAN ASSISTANT:   ASSISTANTS: [place] Bruns, PA-C   ANESTHESIA:   regional and general  EBL:  Less than 100 cc   BLOOD ADMINISTERED:none  DRAINS: none   LOCAL MEDICATIONS USED:  MARCAINE     SPECIMEN:  Source of Specimen:  right shoulder fluid and tissue for culture  DISPOSITION OF SPECIMEN:  micro  COUNTS:  YES  TOURNIQUET:  * No tourniquets in log *  DICTATION: .Other Dictation: Dictation Number [id number]  PLAN OF CARE: Admit for overnight observation  PATIENT DISPOSITION:  PACU - hemodynamically stable.   Delay start of Pharmacological VTE agent (>24hrs) due to surgical blood loss or risk of bleeding: not applicable

## 2018-11-21 ENCOUNTER — Encounter (HOSPITAL_COMMUNITY): Payer: Self-pay | Admitting: General Practice

## 2018-11-21 DIAGNOSIS — M25511 Pain in right shoulder: Secondary | ICD-10-CM | POA: Diagnosis present

## 2018-11-21 DIAGNOSIS — T8459XA Infection and inflammatory reaction due to other internal joint prosthesis, initial encounter: Secondary | ICD-10-CM | POA: Diagnosis present

## 2018-11-21 DIAGNOSIS — Z87891 Personal history of nicotine dependence: Secondary | ICD-10-CM | POA: Diagnosis not present

## 2018-11-21 DIAGNOSIS — D62 Acute posthemorrhagic anemia: Secondary | ICD-10-CM | POA: Diagnosis not present

## 2018-11-21 DIAGNOSIS — I509 Heart failure, unspecified: Secondary | ICD-10-CM | POA: Diagnosis present

## 2018-11-21 DIAGNOSIS — I340 Nonrheumatic mitral (valve) insufficiency: Secondary | ICD-10-CM | POA: Diagnosis present

## 2018-11-21 DIAGNOSIS — M47817 Spondylosis without myelopathy or radiculopathy, lumbosacral region: Secondary | ICD-10-CM | POA: Diagnosis present

## 2018-11-21 DIAGNOSIS — D649 Anemia, unspecified: Secondary | ICD-10-CM | POA: Diagnosis present

## 2018-11-21 DIAGNOSIS — I2510 Atherosclerotic heart disease of native coronary artery without angina pectoris: Secondary | ICD-10-CM | POA: Diagnosis present

## 2018-11-21 DIAGNOSIS — M5481 Occipital neuralgia: Secondary | ICD-10-CM | POA: Diagnosis present

## 2018-11-21 DIAGNOSIS — Z7952 Long term (current) use of systemic steroids: Secondary | ICD-10-CM | POA: Diagnosis not present

## 2018-11-21 DIAGNOSIS — Z7982 Long term (current) use of aspirin: Secondary | ICD-10-CM | POA: Diagnosis not present

## 2018-11-21 DIAGNOSIS — Y831 Surgical operation with implant of artificial internal device as the cause of abnormal reaction of the patient, or of later complication, without mention of misadventure at the time of the procedure: Secondary | ICD-10-CM | POA: Diagnosis present

## 2018-11-21 DIAGNOSIS — K219 Gastro-esophageal reflux disease without esophagitis: Secondary | ICD-10-CM | POA: Diagnosis present

## 2018-11-21 DIAGNOSIS — M069 Rheumatoid arthritis, unspecified: Secondary | ICD-10-CM | POA: Diagnosis present

## 2018-11-21 DIAGNOSIS — Y792 Prosthetic and other implants, materials and accessory orthopedic devices associated with adverse incidents: Secondary | ICD-10-CM | POA: Diagnosis present

## 2018-11-21 DIAGNOSIS — Z79899 Other long term (current) drug therapy: Secondary | ICD-10-CM | POA: Diagnosis not present

## 2018-11-21 DIAGNOSIS — I110 Hypertensive heart disease with heart failure: Secondary | ICD-10-CM | POA: Diagnosis present

## 2018-11-21 DIAGNOSIS — Z953 Presence of xenogenic heart valve: Secondary | ICD-10-CM | POA: Diagnosis not present

## 2018-11-21 LAB — BASIC METABOLIC PANEL
Anion gap: 10 (ref 5–15)
BUN: 20 mg/dL (ref 8–23)
CO2: 25 mmol/L (ref 22–32)
Calcium: 8.5 mg/dL — ABNORMAL LOW (ref 8.9–10.3)
Chloride: 102 mmol/L (ref 98–111)
Creatinine, Ser: 1.19 mg/dL (ref 0.61–1.24)
GFR calc Af Amer: >60 mL/min (ref >60)
GFR calc non Af Amer: 57 mL/min — ABNORMAL LOW (ref >60)
Glucose, Bld: 72 mg/dL (ref 70–99)
Potassium: 4.3 mmol/L (ref 3.5–5.1)
Sodium: 137 mmol/L (ref 135–145)

## 2018-11-21 LAB — HEMOGLOBIN AND HEMATOCRIT, BLOOD
HCT: 20.7 % — ABNORMAL LOW (ref 39.0–52.0)
HCT: 25.8 % — ABNORMAL LOW (ref 39.0–52.0)
Hemoglobin: 6.7 g/dL — CL (ref 13.0–17.0)
Hemoglobin: 8.3 g/dL — ABNORMAL LOW (ref 13.0–17.0)

## 2018-11-21 LAB — PREPARE RBC (CROSSMATCH)

## 2018-11-21 MED ORDER — CEPHALEXIN 500 MG PO CAPS: 500.0000 mg | ORAL_CAPSULE | Freq: Three times a day (TID) | ORAL | 0 refills | Status: DC

## 2018-11-21 MED ORDER — SODIUM CHLORIDE 0.9% IV SOLUTION
Freq: Once | INTRAVENOUS | Status: AC
  Administered 2018-11-21: 05:00:00 via INTRAVENOUS

## 2018-11-21 NOTE — Progress Notes (Signed)
Pt hemoglobin 6.7 confirmed by lab at 0500, attending doctor called, nurse got order of 1 unit RBC transfusion, pt asymptomatic, no complains of SOB, will continue to monitor.

## 2018-11-21 NOTE — Progress Notes (Signed)
Patient discharging home. Hg 8.3 post transfusion. Discharge instructions explained to patient and he verbalized understanding. Took all personal belongings. No further questions or concerns voiced.

## 2018-11-21 NOTE — Discharge Summary (Signed)
Orthopedic Discharge Summary        Physician Discharge Summary  Patient ID: Joshua Hudson MRN: [id number] DOB/AGE: 1934-11-28 84 y.o.  Admit date: 11/20/2018 Discharge date: 11/21/2018   Procedures:  Procedure(s) (LRB): IRRIGATION AND DEBRIDEMENT SHOULDER WITH POLY EXCHANGE (Right)  Attending Physician:  Dr. Esmond Plants  Admission Diagnoses:  Suspected right shoulder infection after reverse total shoulder replacement  Discharge Diagnoses:  same   Past Medical History:  Diagnosis Date   Anemia    Previous history of anemia   CHF (congestive heart failure) ([place])    Pt states "[name] dx with CHF a week ago per pre-op call on 11/19/18"   Colon polyps    s/p diverticular perforation requiring 2-stage repair   Coronary artery disease    a. 20-30% OM2 by cath 2009. b. nonobstructive of all 3 vessels by coronary CTA 2015.   Diverticulosis    DJD (degenerative joint disease), lumbosacral    Esophageal stricture    GERD (gastroesophageal reflux disease)    History of blood transfusion    patient states "years ago"   History of kidney stones    passed stones   Hypertension    Mitral regurgitation 05/16/2016   mild by echo 05/2016   Occipital neuralgia    Pneumonia    Postoperative atrial fibrillation ([facility]) 05/10/2015   Rheumatoid arthritis ([place])    s/o long term steroids  shoulders and hands   Rotator cuff arthropathy    right   S/P aortic valve replacement with tissue    SBE (subacute bacterial endocarditis) prophylaxis candidate    for dental procedures   Severe aortic stenosis    S/P prosthetic valve replacement w 25 mm Edwards like science percardial tissue valve,[facility] - 01/2008    PCP: Lavone Orn, MD   Discharged Condition: good  Hospital Course:  Patient underwent the above stated procedure on 11/20/2018. Patient tolerated the procedure well and brought to the recovery room in good condition and subsequently to the floor. Patient had  an uncomplicated hospital course and was stable for discharge. Will place on empiric po abx pending culture results   Disposition: Discharge disposition: 01-Home or Self Care      with follow up in 2 weeks   Follow-up Information    Netta Cedars, MD. Call in 2 weeks.   Specialty:  Orthopedic Surgery Why:  [phone] Contact information: [address] [place] [address] [phone]           Discharge Instructions    Call MD / Call 911   Complete by:  As directed    If you experience chest pain or shortness of breath, CALL 911 and be transported to the hospital emergency room.  If you develope a fever above 101 F, pus (white drainage) or increased drainage or redness at the wound, or calf pain, call your surgeon's office.   Constipation Prevention   Complete by:  As directed    Drink plenty of fluids.  Prune juice may be helpful.  You may use a stool softener, such as Colace (over the counter) 100 mg twice a day.  Use MiraLax (over the counter) for constipation as needed.   Diet - low sodium heart healthy   Complete by:  As directed    Increase activity slowly as tolerated   Complete by:  As directed       Allergies as of 11/21/2018   No Known Allergies     Medication List  TAKE these medications   aspirin 81 MG EC tablet Take 1 tablet (81 mg total) by mouth daily.   cephALEXin 500 MG capsule Commonly known as:  Keflex Take 1 capsule (500 mg total) by mouth 3 (three) times daily.   CLEAR EYES OP Place 1 drop into both eyes 3 (three) times daily as needed (for dry eyes).   ferrous sulfate 325 (65 FE) MG tablet Take 325 mg by mouth daily with breakfast.   folic acid 1 MG tablet Commonly known as:  FOLVITE Take 1 mg by mouth daily.   furosemide 40 MG tablet Commonly known as:  LASIX Take 40 mg by mouth daily.   leflunomide 20 MG tablet Commonly known as:  ARAVA Take 20 mg by mouth daily.   MEGARED OMEGA-3 KRILL OIL PO Take 1  capsule by mouth daily.   methotrexate 2.5 MG tablet Commonly known as:  RHEUMATREX Take 20 mg by mouth every Monday. In the morning. Caution:Chemotherapy. Protect from light.   metoprolol succinate 25 MG 24 hr tablet Commonly known as:  TOPROL-XL TAKE 1 TABLET BY MOUTH ONCE DAILY   multivitamin with minerals Tabs tablet Take 1 tablet by mouth daily.   ondansetron 4 MG tablet Commonly known as:  Zofran Take 1 tablet (4 mg total) by mouth every 6 (six) hours as needed for nausea or vomiting.   oxyCODONE 5 MG immediate release tablet Commonly known as:  Roxicodone Take 1 tablet (5 mg total) by mouth every 4 (four) hours as needed for severe pain or breakthrough pain.   pantoprazole 40 MG tablet Commonly known as:  PROTONIX Take 40 mg by mouth daily before breakfast.   potassium chloride 10 MEQ tablet Commonly known as:  K-DUR Take 10 mEq by mouth daily.   predniSONE 5 MG tablet Commonly known as:  DELTASONE Take 5 mg by mouth daily with breakfast.   tamsulosin 0.4 MG Caps capsule Commonly known as:  FLOMAX Take 0.8 mg by mouth daily after lunch.   Theratears 0.25 % Soln Generic drug:  Carboxymethylcellulose Sodium Place 1 drop into both eyes 3 (three) times daily as needed (dry/irritated eyes.).   traMADol 50 MG tablet Commonly known as:  Ultram Take 1-2 tablets (50-100 mg total) by mouth every 6 (six) hours as needed for moderate pain or severe pain.   TURMERIC PO Take 1 capsule by mouth daily.   vitamin B-12 1000 MCG tablet Commonly known as:  CYANOCOBALAMIN Take 1,000 mcg by mouth daily.   VITAMIN D3 PO Take 1 tablet by mouth daily.         Signed: [name] 11/21/2018, 10:07 AM  [facility] is now [facility] [employer] [address], [place], [place], [place] [address] Phone: [place]

## 2018-11-21 NOTE — Progress Notes (Signed)
Orthopedics Progress Note  Subjective: Patient feels a lot better. He is awaiting completion of his transfusion and then would like to go home.   Objective:  Vitals:   11/21/18 0851 11/21/18 0854  BP: 108/61 108/61  Pulse: 70 70  Resp: 16 16  Temp: 99.2 F (37.3 C) 99.2 F (37.3 C)  SpO2:  97%    General: Awake and alert  Musculoskeletal: Right shoulder with extensive bruising related to bandages from surgery. No arm swelling. Dressing changed. No active bleeding or drainage. No pain with gentle ROM Neurovascularly intact  Lab Results  Component Value Date   WBC 6.3 11/20/2018   HGB 6.7 (LL) 11/21/2018   HCT 20.7 (L) 11/21/2018   MCV 67.8 (L) 11/20/2018   PLT 236 11/20/2018       Component Value Date/Time   NA 137 11/21/2018 0318   K 4.3 11/21/2018 0318   CL 102 11/21/2018 0318   CO2 25 11/21/2018 0318   GLUCOSE 72 11/21/2018 0318   BUN 20 11/21/2018 0318   CREATININE 1.19 11/21/2018 0318   CALCIUM 8.5 (L) 11/21/2018 0318   GFRNONAA 57 (L) 11/21/2018 0318   GFRAA >60 11/21/2018 0318    Lab Results  Component Value Date   INR 1.1 (H) 01/28/2014   INR 2.1 (H) 01/14/2008   INR 1.2 01/13/2008    Assessment/Plan: POD #1 s/p Procedure(s): IRRIGATION AND DEBRIDEMENT SHOULDER WITH POLY EXCHANGE Gram Stain neg for organisms.  Acute blood loss anemia superimposed upon chronic anemia. Transfusion of 1 unit PRBCs per hospital policy. D/C home if post transfusion H+H ok Follow up in two weeks in the office Will start empiric Keflex for two weeks.  Doran Heater. Veverly Fells, MD 11/21/2018 9:57 AM

## 2018-11-22 LAB — TYPE AND SCREEN
ABO/RH(D): A POS
Antibody Screen: NEGATIVE
Unit division: 0

## 2018-11-22 LAB — BPAM RBC
Blood Product Expiration Date: 202004092359
ISSUE DATE / TIME: 202003190603
Unit Type and Rh: 6200

## 2018-11-25 LAB — AEROBIC/ANAEROBIC CULTURE W GRAM STAIN (SURGICAL/DEEP WOUND)

## 2018-11-26 LAB — AEROBIC/ANAEROBIC CULTURE W GRAM STAIN (SURGICAL/DEEP WOUND)

## 2018-12-30 ENCOUNTER — Encounter (HOSPITAL_COMMUNITY): Payer: Self-pay | Admitting: *Deleted

## 2018-12-30 ENCOUNTER — Other Ambulatory Visit: Payer: Self-pay

## 2018-12-30 NOTE — H&P (Signed)
Joshua Hudson is an 84 y.o. male.   Chief Complaint: 84 yo male who presents with a dislocated right shoulder reverse total shoulder replacement.  Patient has suffered multiple episodes of prior instability as well as a P. Acnes infection treated with prior I+D.  He reports reaching out away from his body when he felt and heard a "pop" and was unable to use his shoulder after that.  He said that the shoulder felt fine and was performing well prior to that instance.  HPI: see above  Past Medical History:  Diagnosis Date  . Anemia    Previous history of anemia  . CHF (congestive heart failure) ([facility])    Pt states "[name] dx with CHF a week ago per pre-op call on 11/19/18"  . Colon polyps    s/p diverticular perforation requiring 2-stage repair  . Coronary artery disease    a. 20-30% OM2 by cath 2009. b. nonobstructive of all 3 vessels by coronary CTA 2015.  . Diverticulosis   . DJD (degenerative joint disease), lumbosacral   . Esophageal stricture   . GERD (gastroesophageal reflux disease)   . History of blood transfusion    patient states "years ago"  . History of kidney stones    passed stones  . Hypertension   . Mitral regurgitation 05/16/2016   mild by echo 05/2016  . Occipital neuralgia   . Pneumonia   . Postoperative atrial fibrillation ([place]) 05/10/2015  . Rheumatoid arthritis ([place])    s/o long term steroids  shoulders and hands  . Rotator cuff arthropathy    right  . S/P aortic valve replacement with tissue   . SBE (subacute bacterial endocarditis) prophylaxis candidate    for dental procedures  . Severe aortic stenosis    S/P prosthetic valve replacement w 25 mm Edwards like science percardial tissue valve,[facility] - 01/2008    Past Surgical History:  Procedure Laterality Date  . APPENDECTOMY    . BOTOX INJECTION N/A 01/22/2018   Procedure: BOTOX INJECTION;  Surgeon: Ronnette Juniper, MD;  Location: [facility];  Service: Gastroenterology;  Laterality: N/A;  . CARDIAC  CATHETERIZATION     09  . CARDIAC VALVE REPLACEMENT  01/2008   aortic valve replacement  . CATARACT EXTRACTION W/ INTRAOCULAR LENS  IMPLANT, BILATERAL    . COLON RESECTION     diverticulitis   . dental implants     permanent  . ESOPHAGEAL MANOMETRY N/A 11/07/2017   Procedure: ESOPHAGEAL MANOMETRY (EM);  Surgeon: Ronnette Juniper, MD;  Location: [facility];  Service: Gastroenterology;  Laterality: N/A;  . ESOPHAGOGASTRODUODENOSCOPY (EGD) WITH PROPOFOL N/A 01/22/2018   Procedure: ESOPHAGOGASTRODUODENOSCOPY (EGD) WITH PROPOFOL;  Surgeon: Ronnette Juniper, MD;  Location: [facility];  Service: Gastroenterology;  Laterality: N/A;  . HERNIA REPAIR    . IRRIGATION AND DEBRIDEMENT SHOULDER Right 11/20/2018    IRRIGATION AND DEBRIDEMENT SHOULDER WITH POLY EXCHANGE (Right Shoulder)  . IRRIGATION AND DEBRIDEMENT SHOULDER Right 11/20/2018   Procedure: IRRIGATION AND DEBRIDEMENT SHOULDER WITH POLY EXCHANGE;  Surgeon: Netta Cedars, MD;  Location: [facility];  Service: Orthopedics;  Laterality: Right;  . LUMBAR LAMINECTOMY     x 2  . REVERSE SHOULDER ARTHROPLASTY Right 03/01/2018   Procedure: RIGHT REVERSE SHOULDER ARTHROPLASTY;  Surgeon: Netta Cedars, MD;  Location: [facility];  Service: Orthopedics;  Laterality: Right;  . SAVORY DILATION N/A 01/22/2018   Procedure: SAVORY DILATION;  Surgeon: Ronnette Juniper, MD;  Location: [facility];  Service: Gastroenterology;  Laterality: N/A;  . SHOULDER HEMI-ARTHROPLASTY Right  06/14/2018   Procedure: RIGHT  REVERSE TOTAL SHOULDER OPEN POLY EXCHANGE;  Surgeon: Netta Cedars, MD;  Location: [facility];  Service: Orthopedics;  Laterality: Right;  . TEE WITHOUT CARDIOVERSION N/A 01/29/2014   Procedure: TRANSESOPHAGEAL ECHOCARDIOGRAM (TEE);  Surgeon: Sueanne Margarita, MD;  [facility];  Service: Cardiovascular;  Laterality: N/A;  . TONSILLECTOMY      Family History  Problem Relation Age of Onset  . Other Mother        NO HEALTH PROBLEMS  . Heart disease Father   . Arthritis Father    . Heart Problems Sister        RELATED TO A MVA  . Suicidality Brother   . Other Sister        [place]  . Other Brother        2 [place]  . Other Daughter        2 IN GOOD HEALTH   Social History:  reports that he quit smoking about 35 years ago. His smoking use included cigarettes. He started smoking about 44 years ago. He has a 5.00 pack-year smoking history. He has quit using smokeless tobacco.  His smokeless tobacco use included chew. He reports previous alcohol use. He reports that he does not use drugs.  Allergies: No Known Allergies  No medications prior to admission.    No results found for this or any previous visit (from the past 48 hour(s)). No results found.  ROS Denies fevers or chills or redness in the right shoulder area  There were no vitals taken for this visit. Physical Exam   AAO, NAD, right shoulder with some swelling and deformity consistent with a reverse shoulder dislocation. Skin intact, NVI Left shoulder with pain free AROM and normal shoulder mechanics   Assessment/Plan Recurrent right shoulder instability and possible recurrent infection following right reverse total shoulder replacement.  I discussed with the patient that at this point I would recommend removing the metal components and placement of an antibiotic spacer in order for the best chance at eradication of the P Acnes infection and the plan a second stage revision to a new R-TSA in approximately 3 months  [name], MD 12/30/2018, 2:20 PM

## 2018-12-30 NOTE — Progress Notes (Signed)
Spoke with pt for pre-op call. Pt here in March, states nothing has changed with his medical and surgical history. Denies any recent chest pain or sob.    Coronavirus Screening  Have you experienced the following symptoms:  Cough No Fever (>100.68F)  NO Runny nose NO Sore throat NO Difficulty breathing/shortness of breath NO    Patient notified that hospital visitation restrictions are in effect and the importance of the restrictions.

## 2018-12-31 ENCOUNTER — Inpatient Hospital Stay (HOSPITAL_COMMUNITY): Payer: Medicare Other

## 2018-12-31 ENCOUNTER — Other Ambulatory Visit: Payer: Self-pay

## 2018-12-31 ENCOUNTER — Inpatient Hospital Stay (HOSPITAL_COMMUNITY): Payer: Medicare Other | Admitting: Anesthesiology

## 2018-12-31 ENCOUNTER — Encounter (HOSPITAL_COMMUNITY): Payer: Self-pay

## 2018-12-31 ENCOUNTER — Inpatient Hospital Stay (HOSPITAL_COMMUNITY)
Admission: RE | Admit: 2018-12-31 | Discharge: 2019-01-01 | DRG: 496 | Disposition: A | Discharge status: Home / Self Care | Payer: Medicare Other | Attending: Orthopedic Surgery | Admitting: Orthopedic Surgery

## 2018-12-31 ENCOUNTER — Encounter (HOSPITAL_COMMUNITY)
Admission: RE | Disposition: A | Discharge status: Home / Self Care | Payer: Self-pay | Source: Home / Self Care | Attending: Orthopedic Surgery

## 2018-12-31 DIAGNOSIS — I509 Heart failure, unspecified: Secondary | ICD-10-CM | POA: Diagnosis present

## 2018-12-31 DIAGNOSIS — T8459XA Infection and inflammatory reaction due to other internal joint prosthesis, initial encounter: Principal | ICD-10-CM | POA: Diagnosis present

## 2018-12-31 DIAGNOSIS — Z8249 Family history of ischemic heart disease and other diseases of the circulatory system: Secondary | ICD-10-CM | POA: Diagnosis not present

## 2018-12-31 DIAGNOSIS — Z8261 Family history of arthritis: Secondary | ICD-10-CM | POA: Diagnosis not present

## 2018-12-31 DIAGNOSIS — B9689 Other specified bacterial agents as the cause of diseases classified elsewhere: Secondary | ICD-10-CM | POA: Diagnosis present

## 2018-12-31 DIAGNOSIS — I110 Hypertensive heart disease with heart failure: Secondary | ICD-10-CM | POA: Diagnosis present

## 2018-12-31 DIAGNOSIS — Y792 Prosthetic and other implants, materials and accessory orthopedic devices associated with adverse incidents: Secondary | ICD-10-CM | POA: Diagnosis present

## 2018-12-31 DIAGNOSIS — M01X11 Direct infection of right shoulder in infectious and parasitic diseases classified elsewhere: Secondary | ICD-10-CM | POA: Diagnosis present

## 2018-12-31 DIAGNOSIS — Z87891 Personal history of nicotine dependence: Secondary | ICD-10-CM | POA: Diagnosis not present

## 2018-12-31 DIAGNOSIS — I2510 Atherosclerotic heart disease of native coronary artery without angina pectoris: Secondary | ICD-10-CM | POA: Diagnosis present

## 2018-12-31 DIAGNOSIS — T84028A Dislocation of other internal joint prosthesis, initial encounter: Secondary | ICD-10-CM | POA: Diagnosis present

## 2018-12-31 DIAGNOSIS — D62 Acute posthemorrhagic anemia: Secondary | ICD-10-CM | POA: Diagnosis not present

## 2018-12-31 DIAGNOSIS — M069 Rheumatoid arthritis, unspecified: Secondary | ICD-10-CM | POA: Diagnosis present

## 2018-12-31 DIAGNOSIS — I340 Nonrheumatic mitral (valve) insufficiency: Secondary | ICD-10-CM | POA: Diagnosis present

## 2018-12-31 DIAGNOSIS — Y831 Surgical operation with implant of artificial internal device as the cause of abnormal reaction of the patient, or of later complication, without mention of misadventure at the time of the procedure: Secondary | ICD-10-CM | POA: Diagnosis present

## 2018-12-31 DIAGNOSIS — Z952 Presence of prosthetic heart valve: Secondary | ICD-10-CM | POA: Diagnosis not present

## 2018-12-31 DIAGNOSIS — M009 Pyogenic arthritis, unspecified: Secondary | ICD-10-CM | POA: Diagnosis present

## 2018-12-31 HISTORY — PX: EXCISIONAL TOTAL SHOULDER ARTHROPLASTY WITH ANTIBIOTIC SPACER: SHX6264

## 2018-12-31 LAB — CBC
HCT: 28.2 % — ABNORMAL LOW (ref 39.0–52.0)
Hemoglobin: 8.8 g/dL — ABNORMAL LOW (ref 13.0–17.0)
MCH: 22 pg — ABNORMAL LOW (ref 26.0–34.0)
MCHC: 31.2 g/dL (ref 30.0–36.0)
MCV: 70.5 fL — ABNORMAL LOW (ref 80.0–100.0)
Platelets: 138 10*3/uL — ABNORMAL LOW (ref 150–400)
RBC: 4 MIL/uL — ABNORMAL LOW (ref 4.22–5.81)
RDW: 19.1 % — ABNORMAL HIGH (ref 11.5–15.5)
WBC: 6 10*3/uL (ref 4.0–10.5)
nRBC: 0 % (ref 0.0–0.2)

## 2018-12-31 LAB — BASIC METABOLIC PANEL
Anion gap: 14 (ref 5–15)
BUN: 16 mg/dL (ref 8–23)
CO2: 25 mmol/L (ref 22–32)
Calcium: 9.4 mg/dL (ref 8.9–10.3)
Chloride: 100 mmol/L (ref 98–111)
Creatinine, Ser: 0.97 mg/dL (ref 0.61–1.24)
GFR calc Af Amer: >60 mL/min (ref >60)
GFR calc non Af Amer: >60 mL/min (ref >60)
Glucose, Bld: 83 mg/dL (ref 70–99)
Potassium: 4.1 mmol/L (ref 3.5–5.1)
Sodium: 139 mmol/L (ref 135–145)

## 2018-12-31 SURGERY — REMOVAL, HARDWARE, SHOULDER, WITH IRRIGATION, DEBRIDEMENT, AND INSERTION OF ANTIBIOTIC BEADS OR ANTIBIOTIC SPACER
Anesthesia: General | Laterality: Right

## 2018-12-31 MED ORDER — ASPIRIN EC 81 MG PO TBEC
81.0000 mg | DELAYED_RELEASE_TABLET | Freq: Every day | ORAL | Status: DC
  Administered 2019-01-01: 81 mg via ORAL
  Filled 2018-12-31: qty 1

## 2018-12-31 MED ORDER — SODIUM CHLORIDE 0.9 % IV SOLN
INTRAVENOUS | Status: DC
  Administered 2018-12-31: 17:00:00 via INTRAVENOUS

## 2018-12-31 MED ORDER — GLYCOPYRROLATE 0.2 MG/ML IJ SOLN
INTRAMUSCULAR | Status: DC | PRN
  Administered 2018-12-31: 0.2 mg via INTRAVENOUS

## 2018-12-31 MED ORDER — FENTANYL CITRATE (PF) 250 MCG/5ML IJ SOLN
INTRAMUSCULAR | Status: AC
  Filled 2018-12-31: qty 5

## 2018-12-31 MED ORDER — FENTANYL CITRATE (PF) 100 MCG/2ML IJ SOLN
INTRAMUSCULAR | Status: AC
  Administered 2018-12-31: 75 ug via INTRAVENOUS
  Filled 2018-12-31: qty 2

## 2018-12-31 MED ORDER — SODIUM CHLORIDE 0.9 % IV SOLN
INTRAVENOUS | Status: DC | PRN
  Administered 2018-12-31: 13:00:00 25 ug/min via INTRAVENOUS

## 2018-12-31 MED ORDER — METHOCARBAMOL 500 MG PO TABS
500.0000 mg | ORAL_TABLET | Freq: Four times a day (QID) | ORAL | Status: DC | PRN
  Administered 2018-12-31 – 2019-01-01 (×2): 500 mg via ORAL
  Filled 2018-12-31 (×2): qty 1

## 2018-12-31 MED ORDER — CHLORHEXIDINE GLUCONATE 4 % EX LIQD
60.0000 mL | Freq: Once | CUTANEOUS | Status: DC
  Administered 2018-12-31: 4 via TOPICAL

## 2018-12-31 MED ORDER — VITAMIN B-12 1000 MCG PO TABS
1000.0000 ug | ORAL_TABLET | Freq: Every day | ORAL | Status: DC
  Administered 2019-01-01: 1000 ug via ORAL
  Filled 2018-12-31: qty 1

## 2018-12-31 MED ORDER — PHENYLEPHRINE HCL (PRESSORS) 10 MG/ML IV SOLN
INTRAVENOUS | Status: AC
  Filled 2018-12-31: qty 1

## 2018-12-31 MED ORDER — METOPROLOL SUCCINATE ER 25 MG PO TB24
25.0000 mg | ORAL_TABLET | Freq: Every day | ORAL | Status: DC
  Filled 2018-12-31: qty 1

## 2018-12-31 MED ORDER — METHOCARBAMOL 1000 MG/10ML IJ SOLN
500.0000 mg | Freq: Four times a day (QID) | INTRAVENOUS | Status: DC | PRN
  Filled 2018-12-31: qty 5

## 2018-12-31 MED ORDER — METOCLOPRAMIDE HCL 5 MG PO TABS: 5.0000 mg | ORAL_TABLET | Freq: Three times a day (TID) | ORAL | Status: DC | PRN

## 2018-12-31 MED ORDER — LEFLUNOMIDE 20 MG PO TABS
20.0000 mg | ORAL_TABLET | Freq: Every day | ORAL | Status: DC
  Administered 2019-01-01: 10:00:00 20 mg via ORAL
  Filled 2018-12-31: qty 1

## 2018-12-31 MED ORDER — FENTANYL CITRATE (PF) 100 MCG/2ML IJ SOLN
75.0000 ug | Freq: Once | INTRAMUSCULAR | Status: AC
  Administered 2018-12-31: 75 ug via INTRAVENOUS

## 2018-12-31 MED ORDER — FOLIC ACID 1 MG PO TABS
1.0000 mg | ORAL_TABLET | Freq: Every day | ORAL | Status: DC
  Administered 2019-01-01: 10:00:00 1 mg via ORAL
  Filled 2018-12-31: qty 1

## 2018-12-31 MED ORDER — MEGARED OMEGA-3 KRILL OIL 500 MG PO CAPS: 750.0000 mg | ORAL_CAPSULE | Freq: Every day | ORAL | Status: DC

## 2018-12-31 MED ORDER — HYDROMORPHONE HCL 1 MG/ML IJ SOLN: 0.2500 mg | INTRAMUSCULAR | Status: DC | PRN

## 2018-12-31 MED ORDER — ROCURONIUM BROMIDE 50 MG/5ML IV SOSY
PREFILLED_SYRINGE | INTRAVENOUS | Status: AC
  Filled 2018-12-31: qty 5

## 2018-12-31 MED ORDER — ONDANSETRON HCL 4 MG/2ML IJ SOLN
INTRAMUSCULAR | Status: AC
  Filled 2018-12-31: qty 2

## 2018-12-31 MED ORDER — PREDNISONE 5 MG PO TABS
5.0000 mg | ORAL_TABLET | Freq: Every day | ORAL | Status: DC
  Administered 2019-01-01: 08:00:00 5 mg via ORAL
  Filled 2018-12-31: qty 1

## 2018-12-31 MED ORDER — ONDANSETRON HCL 4 MG/2ML IJ SOLN
INTRAMUSCULAR | Status: DC | PRN
  Administered 2018-12-31: 4 mg via INTRAVENOUS

## 2018-12-31 MED ORDER — OXYCODONE HCL 5 MG PO TABS
5.0000 mg | ORAL_TABLET | ORAL | Status: DC | PRN
  Administered 2018-12-31 – 2019-01-01 (×3): 5 mg via ORAL
  Filled 2018-12-31 (×3): qty 1

## 2018-12-31 MED ORDER — BISACODYL 10 MG RE SUPP: 10.0000 mg | Freq: Every day | RECTAL | Status: DC | PRN

## 2018-12-31 MED ORDER — ROPIVACAINE HCL 5 MG/ML IJ SOLN
INTRAMUSCULAR | Status: DC | PRN
  Administered 2018-12-31: 25 mL via PERINEURAL

## 2018-12-31 MED ORDER — LIDOCAINE 2% (20 MG/ML) 5 ML SYRINGE
INTRAMUSCULAR | Status: AC
  Filled 2018-12-31: qty 5

## 2018-12-31 MED ORDER — ALBUMIN HUMAN 5 % IV SOLN
INTRAVENOUS | Status: DC | PRN
  Administered 2018-12-31: 14:00:00 via INTRAVENOUS

## 2018-12-31 MED ORDER — VANCOMYCIN HCL 1000 MG IV SOLR
INTRAVENOUS | Status: DC | PRN
  Administered 2018-12-31: 1000 mg

## 2018-12-31 MED ORDER — CEFAZOLIN SODIUM-DEXTROSE 2-4 GM/100ML-% IV SOLN
2.0000 g | Freq: Four times a day (QID) | INTRAVENOUS | Status: AC
  Administered 2018-12-31 – 2019-01-01 (×3): 2 g via INTRAVENOUS
  Filled 2018-12-31 (×3): qty 100

## 2018-12-31 MED ORDER — ADULT MULTIVITAMIN W/MINERALS CH
1.0000 | ORAL_TABLET | Freq: Every day | ORAL | Status: DC
  Administered 2019-01-01: 1 via ORAL
  Filled 2018-12-31: qty 1

## 2018-12-31 MED ORDER — HYDROMORPHONE HCL 1 MG/ML IJ SOLN: 0.5000 mg | INTRAMUSCULAR | Status: DC | PRN

## 2018-12-31 MED ORDER — OXYCODONE HCL 5 MG PO TABS: 5.0000 mg | ORAL_TABLET | ORAL | 0 refills | Status: DC | PRN

## 2018-12-31 MED ORDER — VANCOMYCIN HCL 1000 MG IV SOLR
INTRAVENOUS | Status: AC
  Filled 2018-12-31: qty 1000

## 2018-12-31 MED ORDER — ACETAMINOPHEN 500 MG PO TABS: 1000.0000 mg | ORAL_TABLET | Freq: Four times a day (QID) | ORAL | Status: AC | PRN

## 2018-12-31 MED ORDER — EPHEDRINE SULFATE-NACL 50-0.9 MG/10ML-% IV SOSY
PREFILLED_SYRINGE | INTRAVENOUS | Status: DC | PRN
  Administered 2018-12-31: 10 mg via INTRAVENOUS

## 2018-12-31 MED ORDER — BUPIVACAINE-EPINEPHRINE (PF) 0.25% -1:200000 IJ SOLN
INTRAMUSCULAR | Status: AC
  Filled 2018-12-31: qty 30

## 2018-12-31 MED ORDER — PROPOFOL 10 MG/ML IV BOLUS
INTRAVENOUS | Status: DC | PRN
  Administered 2018-12-31: 70 mg via INTRAVENOUS

## 2018-12-31 MED ORDER — PANTOPRAZOLE SODIUM 40 MG PO TBEC
40.0000 mg | DELAYED_RELEASE_TABLET | Freq: Every day | ORAL | Status: DC
  Administered 2019-01-01: 40 mg via ORAL
  Filled 2018-12-31: qty 1

## 2018-12-31 MED ORDER — ACETAMINOPHEN 325 MG PO TABS
325.0000 mg | ORAL_TABLET | Freq: Four times a day (QID) | ORAL | Status: DC | PRN
  Administered 2019-01-01: 02:00:00 650 mg via ORAL
  Filled 2018-12-31: qty 2

## 2018-12-31 MED ORDER — PROMETHAZINE HCL 25 MG/ML IJ SOLN: 6.2500 mg | INTRAMUSCULAR | Status: DC | PRN

## 2018-12-31 MED ORDER — DEXAMETHASONE SODIUM PHOSPHATE 10 MG/ML IJ SOLN
INTRAMUSCULAR | Status: DC | PRN
  Administered 2018-12-31: 4 mg via INTRAVENOUS

## 2018-12-31 MED ORDER — VITAMIN D 25 MCG (1000 UNIT) PO TABS
1000.0000 [IU] | ORAL_TABLET | Freq: Every day | ORAL | Status: DC
  Administered 2019-01-01: 1000 [IU] via ORAL
  Filled 2018-12-31: qty 1

## 2018-12-31 MED ORDER — PHENOL 1.4 % MT LIQD: 1.0000 | OROMUCOSAL | Status: DC | PRN

## 2018-12-31 MED ORDER — LACTATED RINGERS IV SOLN
INTRAVENOUS | Status: DC
  Administered 2018-12-31 (×2): via INTRAVENOUS

## 2018-12-31 MED ORDER — PROPOFOL 10 MG/ML IV BOLUS
INTRAVENOUS | Status: AC
  Filled 2018-12-31: qty 20

## 2018-12-31 MED ORDER — FERROUS SULFATE 325 (65 FE) MG PO TABS
325.0000 mg | ORAL_TABLET | Freq: Every day | ORAL | Status: DC
  Administered 2019-01-01: 325 mg via ORAL
  Filled 2018-12-31: qty 1

## 2018-12-31 MED ORDER — CEFAZOLIN SODIUM-DEXTROSE 2-3 GM-%(50ML) IV SOLR
INTRAVENOUS | Status: DC | PRN
  Administered 2018-12-31: 2 g via INTRAVENOUS

## 2018-12-31 MED ORDER — POLYETHYLENE GLYCOL 3350 17 G PO PACK: 17.0000 g | PACK | Freq: Every day | ORAL | Status: DC | PRN

## 2018-12-31 MED ORDER — ONDANSETRON HCL 4 MG PO TABS: 4.0000 mg | ORAL_TABLET | Freq: Four times a day (QID) | ORAL | Status: DC | PRN

## 2018-12-31 MED ORDER — METHOTREXATE 2.5 MG PO TABS: 25.0000 mg | ORAL_TABLET | ORAL | Status: DC

## 2018-12-31 MED ORDER — MENTHOL 3 MG MT LOZG: 1.0000 | LOZENGE | OROMUCOSAL | Status: DC | PRN

## 2018-12-31 MED ORDER — DOCUSATE SODIUM 100 MG PO CAPS
100.0000 mg | ORAL_CAPSULE | Freq: Two times a day (BID) | ORAL | Status: DC
  Administered 2018-12-31 – 2019-01-01 (×2): 100 mg via ORAL
  Filled 2018-12-31 (×2): qty 1

## 2018-12-31 MED ORDER — 0.9 % SODIUM CHLORIDE (POUR BTL) OPTIME
TOPICAL | Status: DC | PRN
  Administered 2018-12-31: 1000 mL

## 2018-12-31 MED ORDER — LIDOCAINE 2% (20 MG/ML) 5 ML SYRINGE
INTRAMUSCULAR | Status: DC | PRN
  Administered 2018-12-31: 60 mg via INTRAVENOUS

## 2018-12-31 MED ORDER — LIDOCAINE-EPINEPHRINE (PF) 1.5 %-1:200000 IJ SOLN
INTRAMUSCULAR | Status: DC | PRN
  Administered 2018-12-31: 10 mL via PERINEURAL

## 2018-12-31 MED ORDER — CEPHALEXIN 500 MG PO CAPS: 500.0000 mg | ORAL_CAPSULE | Freq: Three times a day (TID) | ORAL | 0 refills | Status: DC

## 2018-12-31 MED ORDER — MIDAZOLAM HCL 2 MG/2ML IJ SOLN
INTRAMUSCULAR | Status: AC
  Filled 2018-12-31: qty 2

## 2018-12-31 MED ORDER — DEXAMETHASONE SODIUM PHOSPHATE 10 MG/ML IJ SOLN
INTRAMUSCULAR | Status: AC
  Filled 2018-12-31: qty 1

## 2018-12-31 MED ORDER — METOCLOPRAMIDE HCL 5 MG/ML IJ SOLN: 5.0000 mg | Freq: Three times a day (TID) | INTRAMUSCULAR | Status: DC | PRN

## 2018-12-31 MED ORDER — POLYVINYL ALCOHOL 1.4 % OP SOLN: 1.0000 [drp] | Freq: Three times a day (TID) | OPHTHALMIC | Status: DC | PRN

## 2018-12-31 MED ORDER — TURMERIC 500 MG PO CAPS: 500.0000 mg | ORAL_CAPSULE | Freq: Every day | ORAL | Status: DC

## 2018-12-31 MED ORDER — SODIUM CHLORIDE 0.9 % IR SOLN
Status: DC | PRN
  Administered 2018-12-31 (×2): 3000 mL

## 2018-12-31 MED ORDER — ONDANSETRON HCL 4 MG/2ML IJ SOLN: 4.0000 mg | Freq: Four times a day (QID) | INTRAMUSCULAR | Status: DC | PRN

## 2018-12-31 MED ORDER — SUCCINYLCHOLINE CHLORIDE 200 MG/10ML IV SOSY
PREFILLED_SYRINGE | INTRAVENOUS | Status: DC | PRN
  Administered 2018-12-31: 80 mg via INTRAVENOUS

## 2018-12-31 SURGICAL SUPPLY — 74 items
APPLICATOR CHLORAPREP 3ML ORNG (MISCELLANEOUS) ×3 IMPLANT
BOWL SMART MIX CTS (DISPOSABLE) IMPLANT
BRUSH FEMORAL CANAL (MISCELLANEOUS) ×3 IMPLANT
BUR SURG 4X8 MED (BURR) IMPLANT
BURR SURG 4MMX8MM MEDIUM (BURR)
BURR SURG 4X8 MED (BURR)
CEMENT HV SMART SET (Cement) ×3 IMPLANT
CLOSURE WOUND 1/2 X4 (GAUZE/BANDAGES/DRESSINGS) ×1
COVER SURGICAL LIGHT HANDLE (MISCELLANEOUS) ×3 IMPLANT
COVER WAND RF STERILE (DRAPES) ×3 IMPLANT
DRAPE INCISE IOBAN 66X45 STRL (DRAPES) ×9 IMPLANT
DRAPE ORTHO SPLIT 77X108 STRL (DRAPES) ×4
DRAPE STERI 35X30 U-POUCH (DRAPES) ×3 IMPLANT
DRAPE SURG IRRIG POUCH 19X23 (DRAPES) ×3 IMPLANT
DRAPE SURG ORHT 6 SPLT 77X108 (DRAPES) ×2 IMPLANT
DRAPE U-SHAPE 47X51 STRL (DRAPES) ×3 IMPLANT
DRAPE X-RAY CASS 24X20 (DRAPES) IMPLANT
DRILL BIT 5/64 (BIT) IMPLANT
DRSG ADAPTIC 3X8 NADH LF (GAUZE/BANDAGES/DRESSINGS) ×3 IMPLANT
DRSG PAD ABDOMINAL 8X10 ST (GAUZE/BANDAGES/DRESSINGS) ×6 IMPLANT
DURAPREP 26ML APPLICATOR (WOUND CARE) IMPLANT
ELECT BLADE 4.0 EZ CLEAN MEGAD (MISCELLANEOUS) ×3
ELECT NEEDLE TIP 2.8 STRL (NEEDLE) ×3 IMPLANT
ELECT REM PT RETURN 9FT ADLT (ELECTROSURGICAL) ×3
ELECTRODE BLDE 4.0 EZ CLN MEGD (MISCELLANEOUS) ×1 IMPLANT
ELECTRODE REM PT RTRN 9FT ADLT (ELECTROSURGICAL) ×1 IMPLANT
GAUZE SPONGE 4X4 12PLY STRL (GAUZE/BANDAGES/DRESSINGS) ×3 IMPLANT
GLOVE BIOGEL PI ORTHO PRO 7.5 (GLOVE) ×4
GLOVE BIOGEL PI ORTHO PRO SZ8 (GLOVE) ×2
GLOVE ORTHO TXT STRL SZ7.5 (GLOVE) ×3 IMPLANT
GLOVE PI ORTHO PRO STRL 7.5 (GLOVE) ×2 IMPLANT
GLOVE PI ORTHO PRO STRL SZ8 (GLOVE) ×1 IMPLANT
GLOVE SURG ORTHO 8.5 STRL (GLOVE) ×3 IMPLANT
GOWN STRL REUS W/ TWL LRG LVL3 (GOWN DISPOSABLE) ×1 IMPLANT
GOWN STRL REUS W/ TWL XL LVL3 (GOWN DISPOSABLE) ×4 IMPLANT
GOWN STRL REUS W/TWL LRG LVL3 (GOWN DISPOSABLE) ×2
GOWN STRL REUS W/TWL XL LVL3 (GOWN DISPOSABLE) ×8
KIT BASIN OR (CUSTOM PROCEDURE TRAY) ×3 IMPLANT
KIT INTERSPACE SHOULDER (KITS) ×3 IMPLANT
KIT SHOULDER SPACER 7MM STEM (Shoulder) ×3 IMPLANT
KIT TURNOVER KIT B (KITS) ×3 IMPLANT
MANIFOLD NEPTUNE II (INSTRUMENTS) ×3 IMPLANT
NDL SUT 6 .5 CRC .975X.05 MAYO (NEEDLE) ×1 IMPLANT
NEEDLE 1/2 CIR MAYO (NEEDLE) IMPLANT
NEEDLE HYPO 25GX1X1/2 BEV (NEEDLE) ×3 IMPLANT
NEEDLE MAYO TAPER (NEEDLE) ×2
NS IRRIG 1000ML POUR BTL (IV SOLUTION) ×3 IMPLANT
PACK SHOULDER (CUSTOM PROCEDURE TRAY) ×3 IMPLANT
PAD ABD 8X10 STRL (GAUZE/BANDAGES/DRESSINGS) ×6 IMPLANT
PAD ARMBOARD 7.5X6 YLW CONV (MISCELLANEOUS) ×6 IMPLANT
RESTRAINT HEAD UNIVERSAL NS (MISCELLANEOUS) ×3 IMPLANT
SLING ARM IMMOBILIZER LRG (SOFTGOODS) ×3 IMPLANT
SLING ARM IMMOBILIZER MED (SOFTGOODS) IMPLANT
SPONGE LAP 18X18 RF (DISPOSABLE) ×3 IMPLANT
SPONGE LAP 4X18 RFD (DISPOSABLE) ×3 IMPLANT
STRIP CLOSURE SKIN 1/2X4 (GAUZE/BANDAGES/DRESSINGS) ×2 IMPLANT
SUCTION FRAZIER HANDLE 10FR (MISCELLANEOUS) ×2
SUCTION TUBE FRAZIER 10FR DISP (MISCELLANEOUS) ×1 IMPLANT
SUT FIBERWIRE #2 38 T-5 BLUE (SUTURE) ×15
SUT MNCRL AB 4-0 PS2 18 (SUTURE) ×3 IMPLANT
SUT VIC AB 0 CT1 27 (SUTURE) ×2
SUT VIC AB 0 CT1 27XBRD ANBCTR (SUTURE) ×1 IMPLANT
SUT VIC AB 2-0 CT1 27 (SUTURE) ×2
SUT VIC AB 2-0 CT1 TAPERPNT 27 (SUTURE) ×1 IMPLANT
SUT VICRYL AB 2 0 TIES (SUTURE) ×3 IMPLANT
SUTURE FIBERWR #2 38 T-5 BLUE (SUTURE) ×5 IMPLANT
SYR CONTROL 10ML LL (SYRINGE) ×3 IMPLANT
SYR TOOMEY 50ML (SYRINGE) ×3 IMPLANT
TOWEL OR 17X24 6PK STRL BLUE (TOWEL DISPOSABLE) ×3 IMPLANT
TOWEL OR 17X26 10 PK STRL BLUE (TOWEL DISPOSABLE) ×3 IMPLANT
TOWER CARTRIDGE SMART MIX (DISPOSABLE) IMPLANT
TRAY FOLEY MTR SLVR 16FR STAT (SET/KITS/TRAYS/PACK) ×3 IMPLANT
WATER STERILE IRR 1000ML POUR (IV SOLUTION) ×3 IMPLANT
YANKAUER SUCT BULB TIP NO VENT (SUCTIONS) IMPLANT

## 2018-12-31 NOTE — Anesthesia Procedure Notes (Signed)
Anesthesia Regional Block: Interscalene brachial plexus block   Pre-Anesthetic Checklist: ,, timeout performed, Correct Patient, Correct Site, Correct Laterality, Correct Procedure, Correct Position, site marked, Risks and benefits discussed,  Surgical consent,  Pre-op evaluation,  At surgeon's request and post-op pain management  Laterality: Right  Prep: chloraprep       Needles:  Injection technique: Single-shot  Needle Type: Echogenic Needle     Needle Length: 9cm      Additional Needles:   Procedures:,,,, ultrasound used (permanent image in chart),,,,  Narrative:  Start time: 12/31/2018 12:33 PM End time: 12/31/2018 12:43 PM Injection made incrementally with aspirations every 5 mL.  Performed by: Personally  Anesthesiologist: Myrtie Soman, MD  Additional Notes: Patient tolerated the procedure well without complications

## 2018-12-31 NOTE — Op Note (Signed)
NAME: Joshua Hudson, Joshua Hudson MEDICAL RECORD QA:[id number] ACCOUNT [id number] DATE OF BIRTH:1935/06/19 FACILITY: [facility] LOCATION: [facility] PHYSICIAN:STEVEN Orlena Sheldon, MD  OPERATIVE REPORT  DATE OF PROCEDURE:  12/31/2018  PREOPERATIVE DIAGNOSIS:  Right shoulder recurrent instability and infection following reverse shoulder replacement.  POSTOPERATIVE DIAGNOSIS:  Right shoulder recurrent instability and infection following reverse shoulder replacement.  PROCEDURE PERFORMED:  Right reverse shoulder replacement implant removal followed by irrigation and debridement of tissue and bone and placement of antibiotic spacer.  ATTENDING SURGEON:  Esmond Plants, MD  ASSISTANT:  Darol Destine, [place], who was scrubbed during the entire procedure and necessary for satisfactory completion of surgery.  ANESTHESIA:  General anesthesia was used plus interscalene block.  ESTIMATED BLOOD LOSS:  Less than 100 mL.  FLUID REPLACEMENT:  1000 mL crystalloid.  INSTRUMENT COUNTS:  Correct.  COMPLICATIONS:  No complications.  ANTIBIOTICS:  Perioperative antibiotics were given.  INDICATIONS:  The patient is an 84 year old male with history of right reverse shoulder replacement.  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  The patient has also had  multiple episodes of instability requiring revision to longer implants on the humeral side.  Despite these best efforts, the patient recently dislocated this weekend and presents with swelling and instability in the shoulder.  Due to concerns over  ongoing infection, the recommendation was made to proceed with implant removal and the patient agreed to that.  Informed consent obtained.  DESCRIPTION OF PROCEDURE:  After adequate level of anesthesia was achieved, the patient was positioned in the modified beach chair position.  Right shoulder correctly identified and sterilely prepped and draped  in the usual manner.  Timeout called.  We  entered the shoulder using the patient's standard deltopectoral approach.  We went down through subcutaneous tissues using the knife.  We identified the remaining PDS suture, which was removed.  We then entered the shoulder joint.  There was a fair  amount of clear fluid in the shoulder.  We did go ahead and culture that aerobic, anaerobic, Gram stain.  Next, we delivered the humeral side out of the wound.  We then used the tuning fork to remove the tray from the humeral side.  We next subluxed the  humerus posteriorly and then used the screwdriver to remove the set screw and the glenosphere.  We then used a device that set into the glenosphere hole and then we twisted that device and that broke the [facility] taper and removed the glenosphere.  Next, we  removed the peripheral screws, which were done using a star screwdriver and then we loosened up the lag screw and central lag screw and then we were able to place the insertion handle for the baseplate and then twist the baseplate loose.  We removed  that without any destruction of the bony vault of the glenoid.  The bone looked normal to me underneath.  Soft tissue around the glenoid baseplate looked little iffy, but no cloudy fluid and no foul smell at any time.  We did pulse irrigation 3 liters  normal saline of the entire area around the glenoid and then used a rongeur and also knife and a curet to remove any soft tissue that did not appear to be a part of the patient's native muscle or bone.  Once we had that cleaned well and pulse irrigated,  then we moved back to the humeral side.  I used an osteotome around the proximal humerus  and then placed the humeral stem broach handle and then put that onto the implant and then used the mallet to remove the implant without difficulty.  We irrigated  thoroughly.  We used a curet inside the humerus to remove some soft tissue that had been present around the implant distally.   Once that was all out, pulse irrigation 3 liters normal saline with a brush.  We also removed any soft tissue around the  proximal humerus and then we sized this to the size 11 Exactech Prostalac that has gentamicin in it.  We decided to use a potting technique with DePuy high viscosity cement and 1 gram of vancomycin mixed into that.  We mixed that on the back table,  placed the implant in place in in 30 degrees of retroversion and allowed the cement to set.  We made sure we did not get cement distally.  We just used it around the proximal portion and let that set a fair amount before we set it in, so it should come  out fairly easily, but it was stable.  We reduced the shoulder.  We were pleased with that soft tissue stability and the way that humeral head portion of it engaged up around where the native glenoid and the coracoacromial arch was stable.  We irrigated  briefly and then went ahead and closed the deltopectoral interval with interrupted #1 PDS suture and then we did a single layer of subcutaneous skin closure with 2-0 nylon.  Sterile dressing applied followed by a shoulder sling.  The patient was  transported to recovery room in stable condition.  TN/NUANCE  D:12/31/2018 T:12/31/2018 JOB:[id number]

## 2018-12-31 NOTE — Anesthesia Procedure Notes (Signed)
Procedure Name: Intubation Date/Time: 12/31/2018 1:04 PM Performed by: Leonor Liv, CRNA Pre-anesthesia Checklist: Patient identified, Emergency Drugs available, Suction available and Patient being monitored Patient Re-evaluated:Patient Re-evaluated prior to induction Oxygen Delivery Method: Circle System Utilized Preoxygenation: Pre-oxygenation with 100% oxygen Induction Type: IV induction and Rapid sequence Laryngoscope Size: Mac and 4 Grade View: Grade II Tube type: Oral Tube size: 7.5 mm Number of attempts: 1 Airway Equipment and Method: Stylet and Oral airway Placement Confirmation: ETT inserted through vocal cords under direct vision,  positive ETCO2 and breath sounds checked- equal and bilateral Secured at: 23 cm Tube secured with: Tape Dental Injury: Teeth and Oropharynx as per pre-operative assessment

## 2018-12-31 NOTE — Plan of Care (Signed)
  Problem: Pain Management: Goal: Pain level will decrease with appropriate interventions Outcome: Progressing   

## 2018-12-31 NOTE — Anesthesia Preprocedure Evaluation (Signed)
Anesthesia Evaluation  Patient identified by MRN, date of birth, ID band Patient awake    Reviewed: Allergy & Precautions, NPO status , Patient's Chart, lab work & pertinent test results  Airway Mallampati: II  TM Distance: >3 FB Neck ROM: Full    Dental no notable dental hx.    Pulmonary neg pulmonary ROS, former smoker,    Pulmonary exam normal breath sounds clear to auscultation       Cardiovascular hypertension, Normal cardiovascular exam+ Valvular Problems/Murmurs AI  Rhythm:Regular Rate:Normal  Left ventricle: The cavity size was normal. Systolic function was   normal. Wall motion was normal; there were no regional wall   motion abnormalities. Features are consistent with a pseudonormal   left ventricular filling pattern, with concomitant abnormal   relaxation and increased filling pressure (grade 2 diastolic   dysfunction). - Aortic valve: There was mild regurgitation. Mean gradient (S): 10   mm Hg. - Mitral valve: Calcified annulus. Moderately thickened, moderately   calcified leaflets . There was mild regurgitation. - Left atrium: The atrium was mildly dilated. - Right atrium: The atrium was mildly dilated. - Atrial septum: No defect or patent foramen ovale was identified.   There was redundancy of the septum, with borderline criteria for   aneurysm. - Tricuspid valve: There was moderate regurgitation. - Pulmonary arteries: PA peak pressure: 32 mm Hg (S). - Pericardium, extracardiac: A trivial pericardial effusion was   identified posterior to the heart.  Impressions:  - S/P prior AVR. AV leaflets moderately thickened/calcified, but   gradients within normal range. Normal LV EF. Grade 2 diastolic   dysfunction. Mild MR, moderate TR.    Neuro/Psych negative neurological ROS  negative psych ROS   GI/Hepatic negative GI ROS, Neg liver ROS,   Endo/Other  negative endocrine ROS  Renal/GU negative Renal ROS   negative genitourinary   Musculoskeletal negative musculoskeletal ROS (+)   Abdominal   Peds negative pediatric ROS (+)  Hematology  (+) anemia ,   Anesthesia Other Findings   Reproductive/Obstetrics negative OB ROS                            Anesthesia Physical Anesthesia Plan  ASA: III  Anesthesia Plan: General   Post-op Pain Management:  Regional for Post-op pain   Induction: Intravenous and Rapid sequence  PONV Risk Score and Plan: 2 and Ondansetron, Dexamethasone and Treatment may vary due to age or medical condition  Airway Management Planned: Oral ETT  Additional Equipment:   Intra-op Plan:   Post-operative Plan: Extubation in OR  Informed Consent: I have reviewed the patients History and Physical, chart, labs and discussed the procedure including the risks, benefits and alternatives for the proposed anesthesia with the patient or authorized representative who has indicated his/her understanding and acceptance.     Dental advisory given  Plan Discussed with: CRNA and Surgeon  Anesthesia Plan Comments:         Anesthesia Quick Evaluation

## 2018-12-31 NOTE — Anesthesia Postprocedure Evaluation (Signed)
Anesthesia Post Note  Patient: Joshua Hudson  Procedure(s) Performed: EXCISIONAL TOTAL SHOULDER ARTHROPLASTY WITH ANTIBIOTIC SPACER (Right )     Patient location during evaluation: PACU Anesthesia Type: General Level of consciousness: awake Pain management: pain level controlled Vital Signs Assessment: post-procedure vital signs reviewed and stable Respiratory status: spontaneous breathing Cardiovascular status: stable Postop Assessment: no apparent nausea or vomiting Anesthetic complications: no    Last Vitals:  Vitals:   12/31/18 1528 12/31/18 1553  BP: 108/60 110/63  Pulse: 65 62  Resp: 15 16  Temp: (!) 36.4 C (!) 36.4 C  SpO2: 100% 98%    Last Pain:  Vitals:   12/31/18 1553  TempSrc: Oral  PainSc: 0-No pain                 Blayklee Mable

## 2018-12-31 NOTE — Transfer of Care (Signed)
Immediate Anesthesia Transfer of Care Note  Patient: Joshua Hudson  Procedure(s) Performed: EXCISIONAL TOTAL SHOULDER ARTHROPLASTY WITH ANTIBIOTIC SPACER (Right )  Patient Location: PACU  Anesthesia Type:General  Level of Consciousness: drowsy  Airway & Oxygen Therapy: Patient Spontanous Breathing  Post-op Assessment: Report given to RN and Post -op Vital signs reviewed and stable  Post vital signs: Reviewed and stable  Last Vitals:  Vitals Value Taken Time  BP 108/53 12/31/2018  2:58 PM  Temp    Pulse 72 12/31/2018  2:59 PM  Resp 22 12/31/2018  2:59 PM  SpO2 99 % 12/31/2018  2:59 PM  Vitals shown include unvalidated device data.  Last Pain:  Vitals:   12/31/18 1035  TempSrc: Oral  PainSc:       Patients Stated Pain Goal: 3 (30/09/79 4997)  Complications: No apparent anesthesia complications

## 2018-12-31 NOTE — Progress Notes (Signed)
PHARMACIST - PHYSICIAN ORDER COMMUNICATION  CONCERNING: P&T Medication Policy on Herbal Medications  DESCRIPTION:  This patient's order for:  Krill oil and Turmeric  has been noted.  This product(s) is classified as an "herbal" or natural product. Due to a lack of definitive safety studies or FDA approval, nonstandard manufacturing practices, plus the potential risk of unknown drug-drug interactions while on inpatient medications, the Pharmacy and Therapeutics Committee does not permit the use of "herbal" or natural products of this type within [facility].   ACTION TAKEN: The pharmacy department is unable to verify this order at this time and your patient has been informed of this safety policy. Please reevaluate patient's clinical condition at discharge and address if the herbal or natural product(s) should be resumed at that time.  Minda Ditto PharmD 12/31/2018, 4:11 PM

## 2018-12-31 NOTE — Anesthesia Procedure Notes (Signed)
Anesthesia Procedure Image    

## 2018-12-31 NOTE — Brief Op Note (Signed)
12/31/2018  2:55 PM  PATIENT:  Joshua Hudson  84 y.o. male  PRE-OPERATIVE DIAGNOSIS:  Infected, unstable  right reverse shoulder  POST-OPERATIVE DIAGNOSIS:  Infected, unstable right reverse shoulder  PROCEDURE:  Procedure(s): EXCISIONAL TOTAL SHOULDER ARTHROPLASTY WITH ANTIBIOTIC SPACER (Right) Removal of all metal implants, Irrigation and Debridement including tissue and bone, placement of antibiotic spacer  SURGEON:  Surgeon(s) and Role:    Netta Cedars, MD - Primary  PHYSICIAN ASSISTANT:   ASSISTANTS: [place] Bruns, PA-C   ANESTHESIA:   regional and general  EBL:  100 mL   BLOOD ADMINISTERED:none  DRAINS: none   LOCAL MEDICATIONS USED:  NONE  SPECIMEN:  Source of Specimen:  Right shoulder fluid and tissue  DISPOSITION OF SPECIMEN:  micro  COUNTS:  YES  TOURNIQUET:  * No tourniquets in log *  DICTATION: .Other Dictation: Dictation Number [id number]  PLAN OF CARE: Admit to inpatient   PATIENT DISPOSITION:  PACU - hemodynamically stable.   Delay start of Pharmacological VTE agent (>24hrs) due to surgical blood loss or risk of bleeding: not applicable

## 2018-12-31 NOTE — Discharge Instructions (Signed)
Ice to the shoulder constantly.  Keep the incision covered and clean and dry for one week, then ok to get it wet in the shower.  REST your shoulder, keep your arm across your waist and prop a pillow behind the elbow if needed. DO NOT reach behind your back or push up out of a chair with the operative arm.  Use a sling while you are up and around for comfort, may remove while seated.  Keep pillow propped behind the operative elbow.  Follow up with Dr Veverly Fells in two weeks in the office, call [phone] for appt

## 2019-01-01 ENCOUNTER — Encounter (HOSPITAL_COMMUNITY): Payer: Self-pay | Admitting: Orthopedic Surgery

## 2019-01-01 LAB — PREPARE RBC (CROSSMATCH)

## 2019-01-01 LAB — HEMOGLOBIN AND HEMATOCRIT, BLOOD
HCT: 23.6 % — ABNORMAL LOW (ref 39.0–52.0)
Hemoglobin: 7.4 g/dL — ABNORMAL LOW (ref 13.0–17.0)

## 2019-01-01 MED ORDER — SODIUM CHLORIDE 0.9% IV SOLUTION
Freq: Once | INTRAVENOUS | Status: AC
  Administered 2019-01-01: 10:00:00 via INTRAVENOUS

## 2019-01-01 MED ORDER — ACETAMINOPHEN 325 MG PO TABS
650.0000 mg | ORAL_TABLET | Freq: Once | ORAL | Status: AC
  Administered 2019-01-01: 650 mg via ORAL
  Filled 2019-01-01: qty 2

## 2019-01-01 NOTE — Plan of Care (Signed)
  Problem: Education: Goal: Knowledge of the prescribed therapeutic regimen will improve Outcome: Progressing   Problem: Activity: Goal: Ability to tolerate increased activity will improve Outcome: Progressing   Problem: Pain Management: Goal: Pain level will decrease with appropriate interventions Outcome: Progressing   

## 2019-01-01 NOTE — Discharge Summary (Signed)
Orthopedic Discharge Summary        Physician Discharge Summary  Patient ID: Joshua Hudson MRN: [id number] DOB/AGE: 1934-11-11 84 y.o.  Admit date: 12/31/2018 Discharge date: 01/01/2019   Procedures:  Procedure(s) (LRB): EXCISIONAL TOTAL SHOULDER ARTHROPLASTY WITH ANTIBIOTIC SPACER (Right)  Attending Physician:  Dr. Esmond Plants  Admission Diagnoses:   Right shoulder recurrent instability and infection after R-TSA  Discharge Diagnoses:  same   Past Medical History:  Diagnosis Date  . Anemia    Previous history of anemia  . CHF (congestive heart failure) (HCC)    Pt states "[name] dx with CHF a week ago per pre-op call on 11/19/18"  . Colon polyps    s/p diverticular perforation requiring 2-stage repair  . Coronary artery disease    a. 20-30% OM2 by cath 2009. b. nonobstructive of all 3 vessels by coronary CTA 2015.  . Diverticulosis   . DJD (degenerative joint disease), lumbosacral   . Esophageal stricture   . GERD (gastroesophageal reflux disease)   . History of blood transfusion    patient states "years ago"  . History of kidney stones    passed stones  . Hypertension   . Mitral regurgitation 05/16/2016   mild by echo 05/2016  . Occipital neuralgia   . Pneumonia   . Postoperative atrial fibrillation ([place]) 05/10/2015  . Rheumatoid arthritis ([place])    s/o long term steroids  shoulders and hands  . Rotator cuff arthropathy    right  . S/P aortic valve replacement with tissue   . SBE (subacute bacterial endocarditis) prophylaxis candidate    for dental procedures  . Severe aortic stenosis    S/P prosthetic valve replacement w 25 mm Edwards like science percardial tissue valve,[place] - 01/2008    PCP: Lavone Orn, MD   Discharged Condition: good  Hospital Course:  Patient underwent the above stated procedure on 12/31/2018. Patient tolerated the procedure well and brought to the recovery room in good condition and subsequently to the floor. Patient had an  uncomplicated hospital course other than blood loss anemia acute on chronic anemia. Tranfusion performed and was stable for discharge.   Disposition: Discharge disposition: 01-Home or Self Care      with follow up in 2 weeks   Follow-up Information    Netta Cedars, MD. Call in 1 week(s).   Specialty:  Orthopedic Surgery Why:  [phone] Contact information: [address] [place] [address] [phone]           Discharge Instructions    Call MD / Call 911   Complete by:  As directed    If you experience chest pain or shortness of breath, CALL 911 and be transported to the hospital emergency room.  If you develope a fever above 101 F, pus (white drainage) or increased drainage or redness at the wound, or calf pain, call your surgeon's office.   Constipation Prevention   Complete by:  As directed    Drink plenty of fluids.  Prune juice may be helpful.  You may use a stool softener, such as Colace (over the counter) 100 mg twice a day.  Use MiraLax (over the counter) for constipation as needed.   Diet - low sodium heart healthy   Complete by:  As directed    Increase activity slowly as tolerated   Complete by:  As directed       Allergies as of 01/01/2019   No Known Allergies     Medication  List    TAKE these medications   acetaminophen 500 MG tablet Commonly known as:  TYLENOL Take 1,000 mg by mouth every 6 (six) hours as needed for moderate pain or headache.   aspirin 81 MG EC tablet Take 1 tablet (81 mg total) by mouth daily.   cephALEXin 500 MG capsule Commonly known as:  Keflex Take 1 capsule (500 mg total) by mouth 3 (three) times daily.   cholecalciferol 25 MCG (1000 UT) tablet Commonly known as:  VITAMIN D3 Take 1,000 Units by mouth daily.   CLEAR EYES OP Place 1 drop into both eyes 3 (three) times daily as needed (for dry eyes).   ferrous sulfate 325 (65 FE) MG tablet Take 325 mg by mouth daily with breakfast.   folic acid  1 MG tablet Commonly known as:  FOLVITE Take 1 mg by mouth daily.   leflunomide 20 MG tablet Commonly known as:  ARAVA Take 20 mg by mouth daily.   MEGARED OMEGA-3 KRILL OIL PO Take 750 mg by mouth daily.   methotrexate 2.5 MG tablet Commonly known as:  RHEUMATREX Take 25 mg by mouth every Monday. In the morning. Caution:Chemotherapy. Protect from light.   metoprolol succinate 25 MG 24 hr tablet Commonly known as:  TOPROL-XL TAKE 1 TABLET BY MOUTH ONCE DAILY   multivitamin with minerals Tabs tablet Take 1 tablet by mouth daily.   oxyCODONE 5 MG immediate release tablet Commonly known as:  Roxicodone Take 1 tablet (5 mg total) by mouth every 4 (four) hours as needed for severe pain or breakthrough pain.   pantoprazole 40 MG tablet Commonly known as:  PROTONIX Take 40 mg by mouth daily before breakfast.   predniSONE 5 MG tablet Commonly known as:  DELTASONE Take 5 mg by mouth daily with breakfast.   Theratears 0.25 % Soln Generic drug:  Carboxymethylcellulose Sodium Place 1 drop into both eyes 3 (three) times daily as needed (dry/irritated eyes.).   Turmeric 500 MG Caps Take 500 mg by mouth daily.   vitamin B-12 1000 MCG tablet Commonly known as:  CYANOCOBALAMIN Take 1,000 mcg by mouth daily.         Signed: [name] 01/01/2019, 8:11 AM  [facility] is now [facility] [address], [place], [place], [place] [address] Phone: [place]

## 2019-01-01 NOTE — Progress Notes (Signed)
Pt completed 1 unit PRBC transfusion, no adverse reactions noted, tolerated well, vital signs taken and recorded.

## 2019-01-01 NOTE — Progress Notes (Signed)
Provided discharge education/instructions, all questions and concerns addressed, Pt not in distress, to discharge home with belongings accompanied by daughter.

## 2019-01-01 NOTE — Progress Notes (Signed)
Orthopedics Progress Note  Subjective: Patient denies any shoulder pain  Objective:  Vitals:   12/31/18 2350 01/01/19 0446  BP: 99/62 99/61  Pulse: 65 61  Resp:  14  Temp: 98.6 F (37 C) 97.7 F (36.5 C)  SpO2: 100% 100%    General: Awake and alert  Musculoskeletal: right shoulder with some bloody drainage. Bandage changed due to some soaking Moderate shoulder swelling but no distal swelling Neurovascularly intact  Lab Results  Component Value Date   WBC 6.0 12/31/2018   HGB 7.4 (L) 01/01/2019   HCT 23.6 (L) 01/01/2019   MCV 70.5 (L) 12/31/2018   PLT 138 (L) 12/31/2018       Component Value Date/Time   NA 139 12/31/2018 1027   K 4.1 12/31/2018 1027   CL 100 12/31/2018 1027   CO2 25 12/31/2018 1027   GLUCOSE 83 12/31/2018 1027   BUN 16 12/31/2018 1027   CREATININE 0.97 12/31/2018 1027   CALCIUM 9.4 12/31/2018 1027   GFRNONAA >60 12/31/2018 1027   GFRAA >60 12/31/2018 1027    Lab Results  Component Value Date   INR 1.1 (H) 01/28/2014   INR 2.1 (H) 01/14/2008   INR 1.2 01/13/2008    Assessment/Plan: POD #1 s/p Procedure(s): EXCISIONAL TOTAL SHOULDER ARTHROPLASTY WITH ANTIBIOTIC SPACER Cultures pending. No organisms on gram stain Acute blood loss anemia - will transfuse prior to D/C, discussing with blood bank to see if he meets criteria.  Doran Heater. Veverly Fells, MD 01/01/2019 8:02 AM

## 2019-01-01 NOTE — Progress Notes (Signed)
Started 1PRBC blood transfusion, no adverse reactions noted, vital signs taken and recorded, will continue to monitor.

## 2019-01-02 LAB — TYPE AND SCREEN
ABO/RH(D): A POS
Antibody Screen: NEGATIVE
Unit division: 0

## 2019-01-02 LAB — BPAM RBC
Blood Product Expiration Date: 202005022359
ISSUE DATE / TIME: 202004290948
Unit Type and Rh: 6200

## 2019-01-12 LAB — AEROBIC/ANAEROBIC CULTURE W GRAM STAIN (SURGICAL/DEEP WOUND)

## 2019-01-12 LAB — AEROBIC/ANAEROBIC CULTURE (SURGICAL/DEEP WOUND)

## 2019-01-14 LAB — AEROBIC/ANAEROBIC CULTURE W GRAM STAIN (SURGICAL/DEEP WOUND): Culture: NO GROWTH

## 2019-01-14 LAB — AEROBIC/ANAEROBIC CULTURE (SURGICAL/DEEP WOUND)

## 2019-01-17 ENCOUNTER — Other Ambulatory Visit: Payer: Medicare Other

## 2019-02-14 ENCOUNTER — Other Ambulatory Visit: Payer: Self-pay | Admitting: Physician Assistant

## 2019-02-14 DIAGNOSIS — Z471 Aftercare following joint replacement surgery: Secondary | ICD-10-CM

## 2019-02-17 ENCOUNTER — Ambulatory Visit
Admission: RE | Admit: 2019-02-17 | Discharge: 2019-02-17 | Disposition: A | Discharge status: Home / Self Care | Payer: Medicare Other | Source: Ambulatory Visit | Attending: Physician Assistant | Admitting: Physician Assistant

## 2019-02-17 ENCOUNTER — Other Ambulatory Visit: Payer: Self-pay

## 2019-02-17 DIAGNOSIS — Z471 Aftercare following joint replacement surgery: Secondary | ICD-10-CM

## 2019-03-05 ENCOUNTER — Ambulatory Visit
Admission: RE | Admit: 2019-03-05 | Discharge: 2019-03-05 | Disposition: A | Discharge status: Home / Self Care | Payer: Medicare Other | Source: Ambulatory Visit | Attending: Physician Assistant | Admitting: Physician Assistant

## 2019-03-05 ENCOUNTER — Other Ambulatory Visit: Payer: Self-pay

## 2019-03-05 DIAGNOSIS — Z7952 Long term (current) use of systemic steroids: Secondary | ICD-10-CM

## 2019-03-10 ENCOUNTER — Other Ambulatory Visit: Payer: Self-pay

## 2019-03-10 ENCOUNTER — Ambulatory Visit
Admission: RE | Admit: 2019-03-10 | Discharge: 2019-03-10 | Disposition: A | Discharge status: Home / Self Care | Payer: Medicare Other | Source: Ambulatory Visit | Attending: Internal Medicine | Admitting: Internal Medicine

## 2019-03-10 DIAGNOSIS — R911 Solitary pulmonary nodule: Secondary | ICD-10-CM

## 2019-03-17 ENCOUNTER — Other Ambulatory Visit: Payer: Self-pay | Admitting: Cardiology

## 2019-03-17 ENCOUNTER — Telehealth: Payer: Self-pay

## 2019-03-17 NOTE — Telephone Encounter (Signed)
Patient will be seen in the office. Patient answers no to all screening questions below. Patient understands that there are no visitors allowed. Patient understands not to arrive more than 15 minutes prior to the scheduled appointment time and that a mask will be required for the visit.      COVID-19 Pre-Screening Questions:  . In the past 7 to 10 days have you had a cough,  shortness of breath, headache, congestion, fever (100 or greater) body aches, chills, sore throat, or sudden loss of taste or sense of smell? NO . Have you been around anyone with known Covid 19? NO . Have you been around anyone who is awaiting Covid 19 test results in the past 7 to 10 days? NO . Have you been around anyone who has been exposed to Covid 19, or has mentioned symptoms of Covid 19 within the past 7 to 10 days? NO

## 2019-03-18 ENCOUNTER — Encounter: Payer: Self-pay | Admitting: Physician Assistant

## 2019-03-18 ENCOUNTER — Ambulatory Visit (INDEPENDENT_AMBULATORY_CARE_PROVIDER_SITE_OTHER): Payer: Medicare Other | Admitting: Physician Assistant

## 2019-03-18 ENCOUNTER — Other Ambulatory Visit: Payer: Self-pay

## 2019-03-18 ENCOUNTER — Ambulatory Visit (HOSPITAL_COMMUNITY): Payer: Medicare Other | Attending: Physician Assistant

## 2019-03-18 VITALS — BP 102/46 | HR 82 | Ht 63.0 in | Wt 109.1 lb

## 2019-03-18 DIAGNOSIS — I509 Heart failure, unspecified: Secondary | ICD-10-CM

## 2019-03-18 DIAGNOSIS — Z01818 Encounter for other preprocedural examination: Secondary | ICD-10-CM

## 2019-03-18 DIAGNOSIS — I350 Nonrheumatic aortic (valve) stenosis: Secondary | ICD-10-CM

## 2019-03-18 DIAGNOSIS — R011 Cardiac murmur, unspecified: Secondary | ICD-10-CM | POA: Diagnosis not present

## 2019-03-18 DIAGNOSIS — D649 Anemia, unspecified: Secondary | ICD-10-CM

## 2019-03-18 LAB — ECHOCARDIOGRAM COMPLETE
Height: 63 in
Weight: 1745.92 oz

## 2019-03-18 MED ORDER — POTASSIUM CHLORIDE CRYS ER 20 MEQ PO TBCR: EXTENDED_RELEASE_TABLET | ORAL | 3 refills | Status: DC

## 2019-03-18 MED ORDER — FUROSEMIDE 40 MG PO TABS: ORAL_TABLET | ORAL | 3 refills | Status: DC

## 2019-03-18 NOTE — Patient Instructions (Signed)
Medication Instructions:   Your physician has recommended you make the following change in your medication:   Increase your Lasix to 80MG , 2 tablets once a day for 3 days, then go back to 40MG  1 tablet once a day. Start Potassium 20MEQ, 2 tablets once a day for 3 days, then take 1 tablet once a day.  If you need a refill on your cardiac medications before your next appointment, please call your pharmacy.   Lab work:  You will have labs drawn today: BMET CBC, and BNP  If you have labs (blood work) drawn today and your tests are completely normal, you will receive your results only by: Marland Kitchen MyChart Message (if you have MyChart) OR . A paper copy in the mail If you have any lab test that is abnormal or we need to change your treatment, we will call you to review the results.  Testing/Procedures:  Your physician has requested that you have an echocardiogram TODAY. Echocardiography is a painless test that uses sound waves to create images of your heart. It provides your doctor with information about the size and shape of your heart and how well your heart's chambers and valves are working. This procedure takes approximately one hour. There are no restrictions for this procedure.   Follow-Up:  On 03/24/19 with [name], MD at 2:40PM  Any Other Special Instructions Will Be Listed Below (If Applicable).   Two Gram Sodium Diet 2000 mg  What is Sodium? Sodium is a mineral found naturally in many foods. The most significant source of sodium in the diet is table salt, which is about 40% sodium.  Processed, convenience, and preserved foods also contain a large amount of sodium.  The body needs only 500 mg of sodium daily to function,  A normal diet provides more than enough sodium even if you do not use salt.  Why Limit Sodium? A build up of sodium in the body can cause thirst, increased blood pressure, shortness of breath, and water retention.  Decreasing sodium in the diet can reduce edema and  risk of heart attack or stroke associated with high blood pressure.  Keep in mind that there are many other factors involved in these health problems.  Heredity, obesity, lack of exercise, cigarette smoking, stress and what you eat all play a role.  General Guidelines:  Do not add salt at the table or in cooking.  One teaspoon of salt contains over 2 grams of sodium.  Read food labels  Avoid processed and convenience foods  Ask your dietitian before eating any foods not dicussed in the menu planning guidelines  Consult your physician if you wish to use a salt substitute or a sodium containing medication such as antacids.  Limit milk and milk products to 16 oz (2 cups) per day.  Shopping Hints:  READ LABELS!! "Dietetic" does not necessarily mean low sodium.  Salt and other sodium ingredients are often added to foods during processing.   Menu Planning Guidelines Food Group Choose More Often Avoid  Beverages (see also the milk group All fruit juices, low-sodium, salt-free vegetables juices, low-sodium carbonated beverages Regular vegetable or tomato juices, commercially softened water used for drinking or cooking  Breads and Cereals Enriched white, wheat, rye and pumpernickel bread, hard rolls and dinner rolls; muffins, cornbread and waffles; most dry cereals, cooked cereal without added salt; unsalted crackers and breadsticks; low sodium or homemade bread crumbs Bread, rolls and crackers with salted tops; quick breads; instant hot cereals; pancakes; commercial  bread stuffing; self-rising flower and biscuit mixes; regular bread crumbs or cracker crumbs  Desserts and Sweets Desserts and sweets mad with mild should be within allowance Instant pudding mixes and cake mixes  Fats Butter or margarine; vegetable oils; unsalted salad dressings, regular salad dressings limited to 1 Tbs; light, sour and heavy cream Regular salad dressings containing bacon fat, bacon bits, and salt pork; snack dips made  with instant soup mixes or processed cheese; salted nuts  Fruits Most fresh, frozen and canned fruits Fruits processed with salt or sodium-containing ingredient (some dried fruits are processed with sodium sulfites        Vegetables Fresh, frozen vegetables and low- sodium canned vegetables Regular canned vegetables, sauerkraut, pickled vegetables, and others prepared in brine; frozen vegetables in sauces; vegetables seasoned with ham, bacon or salt pork  Condiments, Sauces, Miscellaneous  Salt substitute with physician's approval; pepper, herbs, spices; vinegar, lemon or lime juice; hot pepper sauce; garlic powder, onion powder, low sodium soy sauce (1 Tbs.); low sodium condiments (ketchup, chili sauce, mustard) in limited amounts (1 tsp.) fresh ground horseradish; unsalted tortilla chips, pretzels, potato chips, popcorn, salsa (1/4 cup) Any seasoning made with salt including garlic salt, celery salt, onion salt, and seasoned salt; sea salt, rock salt, kosher salt; meat tenderizers; monosodium glutamate; mustard, regular soy sauce, barbecue, sauce, chili sauce, teriyaki sauce, steak sauce, Worcestershire sauce, and most flavored vinegars; canned gravy and mixes; regular condiments; salted snack foods, olives, picles, relish, horseradish sauce, catsup   Food preparation: Try these seasonings Meats:    Pork Sage, onion Serve with applesauce  Chicken Poultry seasoning, thyme, parsley Serve with cranberry sauce  Lamb Curry powder, rosemary, garlic, thyme Serve with mint sauce or jelly  Veal Marjoram, basil Serve with current jelly, cranberry sauce  Beef Pepper, bay leaf Serve with dry mustard, unsalted chive butter  Fish [place] leaf, dill Serve with unsalted lemon butter, unsalted parsley butter  Vegetables:    Asparagus Lemon juice   Broccoli Lemon juice   Carrots Mustard dressing parsley, mint, nutmeg, glazed with unsalted butter and sugar   Green beans Marjoram, lemon juice, nutmeg,dill seed    Tomatoes Basil, marjoram, onion   Spice /blend for [employer]" 4 tsp ground thyme 1 tsp ground sage 3 tsp ground rosemary 4 tsp ground marjoram   Test your knowledge 1. A product that says "Salt Free" may still contain sodium. True or False 2. Garlic Powder and Hot Pepper Sauce an be used as alternative seasonings.True or False 3. Processed foods have more sodium than fresh foods.  True or False 4. Canned Vegetables have less sodium than froze True or False  WAYS TO DECREASE YOUR SODIUM INTAKE 1. Avoid the use of added salt in cooking and at the table.  Table salt (and other prepared seasonings which contain salt) is probably one of the greatest sources of sodium in the diet.  Unsalted foods can gain flavor from the sweet, sour, and butter taste sensations of herbs and spices.  Instead of using salt for seasoning, try the following seasonings with the foods listed.  Remember: how you use them to enhance natural food flavors is limited only by your creativity... Allspice-Meat, fish, eggs, fruit, peas, red and yellow vegetables Almond Extract-Fruit baked goods Anise Seed-Sweet breads, fruit, carrots, beets, cottage cheese, cookies (tastes like licorice) Basil-Meat, fish, eggs, vegetables, rice, vegetables salads, soups, sauces Bay Leaf-Meat, fish, stews, poultry Burnet-Salad, vegetables (cucumber-like flavor) Caraway Seed-Bread, cookies, cottage cheese, meat, vegetables, cheese, rice Cardamon-Baked goods,  fruit, soups Celery Powder or seed-Salads, salad dressings, sauces, meatloaf, soup, bread.Do not use  celery salt Chervil-Meats, salads, fish, eggs, vegetables, cottage cheese (parsley-like flavor) Chili Power-Meatloaf, chicken cheese, corn, eggplant, egg dishes Chives-Salads cottage cheese, egg dishes, soups, vegetables, sauces Cilantro-Salsa, casseroles Cinnamon-Baked goods, fruit, pork, lamb, chicken, carrots Cloves-Fruit, baked goods, fish, pot roast, green beans, beets,  carrots Coriander-Pastry, cookies, meat, salads, cheese (lemon-orange flavor) Cumin-Meatloaf, fish,cheese, eggs, cabbage,fruit pie (caraway flavor) [employer], fruit, eggs, fish, poultry, cottage cheese, vegetables Dill Seed-Meat, cottage cheese, poultry, vegetables, fish, salads, bread Fennel Seed-Bread, cookies, apples, pork, eggs, fish, beets, cabbage, cheese, Licorice-like flavor Garlic-(buds or powder) Salads, meat, poultry, fish, bread, butter, vegetables, potatoes.Do not  use garlic salt Ginger-Fruit, vegetables, baked goods, meat, fish, poultry Horseradish Root-Meet, vegetables, butter Lemon Juice or Extract-Vegetables, fruit, tea, baked goods, fish salads Mace-Baked goods fruit, vegetables, fish, poultry (taste like nutmeg) Maple Extract-Syrups Marjoram-Meat, chicken, fish, vegetables, breads, green salads (taste like Sage) Mint-Tea, lamb, sherbet, vegetables, desserts, carrots, cabbage Mustard, Dry or Seed-Cheese, eggs, meats, vegetables, poultry Nutmeg-Baked goods, fruit, chicken, eggs, vegetables, desserts Onion Powder-Meat, fish, poultry, vegetables, cheese, eggs, bread, rice salads (Do not use   Onion salt) Orange Extract-Desserts, baked goods Oregano-Pasta, eggs, cheese, onions, pork, lamb, fish, chicken, vegetables, green salads Paprika-Meat, fish, poultry, eggs, cheese, vegetables Parsley Flakes-Butter, vegetables, meat fish, poultry, eggs, bread, salads (certain forms may   Contain sodium Pepper-Meat fish, poultry, vegetables, eggs Peppermint Extract-Desserts, baked goods Poppy Seed-Eggs, bread, cheese, fruit dressings, baked goods, noodles, vegetables, cottage  [employer], poultry, meat, fish, cauliflower, turnips,eggs bread Saffron-Rice, bread, veal, chicken, fish, eggs Sage-Meat, fish, poultry, onions, eggplant, tomateos, pork, stews Savory-Eggs, salads, poultry, meat, rice, vegetables, soups, pork Tarragon-Meat,  poultry, fish, eggs, butter, vegetables (licorice-like flavor)  Thyme-Meat, poultry, fish, eggs, vegetables, (clover-like flavor), sauces, soups Tumeric-Salads, butter, eggs, fish, rice, vegetables (saffron-like flavor) Vanilla Extract-Baked goods, candy Vinegar-Salads, vegetables, meat marinades Walnut Extract-baked goods, candy  2. Choose your Foods Wisely   The following is a list of foods to avoid which are high in sodium:  Meats-Avoid all smoked, canned, salt cured, dried and kosher meat and fish as well as Anchovies   [employer] meats:Bologna, Liverwurst, Pastrami Canned meat or fish  Marinated herring Caviar    Pepperoni Corned Beef   Pizza Dried chipped beef  Salami Frozen breaded fish or meat Salt pork Frankfurters or hot dogs  Sardines Gefilte fish   Sausage Ham (boiled ham, Proscuitto Smoked butt    spiced ham)   Spam      TV Dinners Vegetables Canned vegetables (Regular) Relish Canned mushrooms  Sauerkraut Olives    Tomato juice Pickles  Bakery and Dessert Products Canned puddings  Cream pies Cheesecake   Decorated cakes Cookies  Beverages/Juices Tomato juice, regular  Gatorade   V-8 vegetable juice, regular  Breads and Cereals Biscuit mixes   Salted potato chips, corn chips, pretzels Bread stuffing mixes  Salted crackers and rolls Pancake and waffle mixes Self-rising flour  Seasonings Accent    Meat sauces Barbecue sauce  Meat tenderizer Catsup    Monosodium glutamate (MSG) Celery salt   Onion salt Chili sauce   Prepared mustard Garlic salt   Salt, seasoned salt, sea salt Gravy mixes   Soy sauce Horseradish   Steak sauce Ketchup   Tartar sauce Lite salt    Teriyaki sauce Marinade mixes   Worcestershire sauce  Others Baking powder   Cocoa and cocoa mixes Baking soda  Commercial casserole mixes Candy-caramels, chocolate  Dehydrated soups    Bars, fudge,nougats  Instant rice and pasta mixes Canned broth or soup  Maraschino  cherries Cheese, aged and processed cheese and cheese spreads  Learning Assessment Quiz  Indicated T (for True) or F (for False) for each of the following statements:  1. _____ Fresh fruits and vegetables and unprocessed grains are generally low in sodium 2. _____ Water may contain a considerable amount of sodium, depending on the source 3. _____ You can always tell if a food is high in sodium by tasting it 4. _____ Certain laxatives my be high in sodium and should be avoided unless prescribed   by a physician or pharmacist 5. _____ Salt substitutes may be used freely by anyone on a sodium restricted diet 6. _____ Sodium is present in table salt, food additives and as a natural component of   most foods 7. _____ Table salt is approximately 90% sodium 8. _____ Limiting sodium intake may help prevent excess fluid accumulation in the body 9. _____ On a sodium-restricted diet, seasonings such as bouillon soy sauce, and    cooking wine should be used in place of table salt 10. _____ On an ingredient list, a product which lists monosodium glutamate as the first   ingredient is an appropriate food to include on a low sodium diet  Circle the best answer(s) to the following statements (Hint: there may be more than one correct answer)  11. On a low-sodium diet, some acceptable snack items are:    A. Olives  F. Bean dip   K. Grapefruit juice    B. Salted Pretzels G. Commercial Popcorn   L. Canned peaches    C. Carrot Sticks  H. Bouillon   M. Unsalted nuts   D. [place] fries  I. Peanut butter crackers N. Salami   E. Sweet pickles J. Tomato Juice   O. Pizza  12.  Seasonings that may be used freely on a reduced - sodium diet include   A. Lemon wedges F.Monosodium glutamate K. Celery seed    B.Soysauce   G. Pepper   L. Mustard powder   C. Sea salt  H. Cooking wine  M. Onion flakes   D. Vinegar  E. Prepared horseradish N. Salsa   E. Sage   J. Worcestershire sauce  O. Chutney

## 2019-03-18 NOTE — Progress Notes (Signed)
Cardiology Office Note    Date:  03/18/2019   ID:  [name], Joshua Hudson 25, 1936, MRN [id number]  PCP:  Lavone Orn, MD  Cardiologist: [name], MD EPS: None  No chief complaint on file.   History of Present Illness:  Joshua Hudson is a 84 y.o. male with history of severe AS s/p AVR 2009 with post op afib-no recurrence, nonobstructive CAD cath 2009-20-30% OM2. Last saw Dr. Radford Pax 07/2018.  Last echo 09/11/18 normal LVEF grade 2 DD, mild MR, Mod TR, S/P AVT gradients in normal range.  Patient needs surgical clearance for right shoulder surgery by Dr. Veverly Fells. Has had 3 shoulder surgeries since last March. Denies chest pain. Legs started swelling 3-4 weeks ago.Has had dyspnea on exertion with strenuous activity but not with everyday living until last week had to stop when walking into [employer] -he was so short of breath.  Short of breath vacuuming and walking in today. Was given Lasix 40 mg daily by Dr. Laurann Montana and it hasn't helped. Gets short of breath if he overdoes it. Eats ham sandwiches and at [employer] 3-4 times/week. Hbg 7.9 June 25. Has chronic anemia and said he had a transfusion last shoulder surgery.   Past Medical History:  Diagnosis Date  . Anemia    Previous history of anemia  . CHF (congestive heart failure) (HCC)    Pt states "[name] dx with CHF a week ago per pre-op call on 11/19/18"  . Colon polyps    s/p diverticular perforation requiring 2-stage repair  . Coronary artery disease    a. 20-30% OM2 by cath 2009. b. nonobstructive of all 3 vessels by coronary CTA 2015.  . Diverticulosis   . DJD (degenerative joint disease), lumbosacral   . Esophageal stricture   . GERD (gastroesophageal reflux disease)   . History of blood transfusion    patient states "years ago"  . History of kidney stones    passed stones  . Hypertension   . Mitral regurgitation 05/16/2016   mild by echo 05/2016  . Occipital neuralgia   . Pneumonia   . Postoperative atrial  fibrillation ([place]) 05/10/2015  . Rheumatoid arthritis ([place])    s/o long term steroids  shoulders and hands  . Rotator cuff arthropathy    right  . S/P aortic valve replacement with tissue   . SBE (subacute bacterial endocarditis) prophylaxis candidate    for dental procedures  . Severe aortic stenosis    S/P prosthetic valve replacement w 25 mm Edwards like science percardial tissue valve,[place] - 01/2008    Past Surgical History:  Procedure Laterality Date  . APPENDECTOMY    . BOTOX INJECTION N/A 01/22/2018   Procedure: BOTOX INJECTION;  Surgeon: Ronnette Juniper, MD;  Location: [facility];  Service: Gastroenterology;  Laterality: N/A;  . CARDIAC CATHETERIZATION     09  . CARDIAC VALVE REPLACEMENT  01/2008   aortic valve replacement  . CATARACT EXTRACTION W/ INTRAOCULAR LENS  IMPLANT, BILATERAL    . COLON RESECTION     diverticulitis   . dental implants     permanent  . ESOPHAGEAL MANOMETRY N/A 11/07/2017   Procedure: ESOPHAGEAL MANOMETRY (EM);  Surgeon: Ronnette Juniper, MD;  Location: [facility];  Service: Gastroenterology;  Laterality: N/A;  . ESOPHAGOGASTRODUODENOSCOPY (EGD) WITH PROPOFOL N/A 01/22/2018   Procedure: ESOPHAGOGASTRODUODENOSCOPY (EGD) WITH PROPOFOL;  Surgeon: Ronnette Juniper, MD;  Location: [facility];  Service: Gastroenterology;  Laterality: N/A;  . EXCISIONAL TOTAL SHOULDER ARTHROPLASTY WITH ANTIBIOTIC SPACER Right  12/31/2018   Procedure: EXCISIONAL TOTAL SHOULDER ARTHROPLASTY WITH ANTIBIOTIC SPACER;  Surgeon: Netta Cedars, MD;  Location: [place];  Service: Orthopedics;  Laterality: Right;  . HERNIA REPAIR    . IRRIGATION AND DEBRIDEMENT SHOULDER Right 11/20/2018    IRRIGATION AND DEBRIDEMENT SHOULDER WITH POLY EXCHANGE (Right Shoulder)  . IRRIGATION AND DEBRIDEMENT SHOULDER Right 11/20/2018   Procedure: IRRIGATION AND DEBRIDEMENT SHOULDER WITH POLY EXCHANGE;  Surgeon: Netta Cedars, MD;  Location: [facility];  Service: Orthopedics;  Laterality: Right;  . LUMBAR LAMINECTOMY     x 2   . REVERSE SHOULDER ARTHROPLASTY Right 03/01/2018   Procedure: RIGHT REVERSE SHOULDER ARTHROPLASTY;  Surgeon: Netta Cedars, MD;  Location: [place];  Service: Orthopedics;  Laterality: Right;  . SAVORY DILATION N/A 01/22/2018   Procedure: SAVORY DILATION;  Surgeon: Ronnette Juniper, MD;  Location: [facility];  Service: Gastroenterology;  Laterality: N/A;  . SHOULDER HEMI-ARTHROPLASTY Right 06/14/2018   Procedure: RIGHT  REVERSE TOTAL SHOULDER OPEN POLY EXCHANGE;  Surgeon: Netta Cedars, MD;  Location: [place];  Service: Orthopedics;  Laterality: Right;  . TEE WITHOUT CARDIOVERSION N/A 01/29/2014   Procedure: TRANSESOPHAGEAL ECHOCARDIOGRAM (TEE);  Surgeon: Sueanne Margarita, MD;  Location: [facility];  Service: Cardiovascular;  Laterality: N/A;  . TONSILLECTOMY      Current Medications: Current Meds  Medication Sig  . acetaminophen (TYLENOL) 500 MG tablet Take 1,000 mg by mouth every 6 (six) hours as needed for moderate pain or headache.  Marland Kitchen aspirin EC 81 MG EC tablet Take 1 tablet (81 mg total) by mouth daily.  . cephALEXin (KEFLEX) 500 MG capsule Take 1 capsule (500 mg total) by mouth 3 (three) times daily.  . cholecalciferol (VITAMIN D3) 25 MCG (1000 UT) tablet Take 1,000 Units by mouth daily.  . ferrous sulfate 325 (65 FE) MG tablet Take 325 mg by mouth daily with breakfast.  . folic acid (FOLVITE) 1 MG tablet Take 1 mg by mouth daily.  Marland Kitchen leflunomide (ARAVA) 20 MG tablet Take 20 mg by mouth daily.  Marland Kitchen MEGARED OMEGA-3 KRILL OIL PO Take 750 mg by mouth daily.   . methotrexate (RHEUMATREX) 2.5 MG tablet Take 25 mg by mouth every Monday. In the morning. Caution:Chemotherapy. Protect from light.  . metoprolol succinate (TOPROL-XL) 25 MG 24 hr tablet Take 1 tablet by mouth once daily  . Multiple Vitamin (MULTIVITAMIN WITH MINERALS) TABS tablet Take 1 tablet by mouth daily.  Marland Kitchen oxyCODONE (ROXICODONE) 5 MG immediate release tablet Take 1 tablet (5 mg total) by mouth every 4 (four) hours as needed for severe  pain or breakthrough pain.  . pantoprazole (PROTONIX) 40 MG tablet Take 40 mg by mouth daily before breakfast.   . predniSONE (DELTASONE) 5 MG tablet Take 5 mg by mouth daily with breakfast.   . THERATEARS 0.25 % SOLN Place 1 drop into both eyes 3 (three) times daily as needed (dry/irritated eyes.).  Marland Kitchen Turmeric 500 MG CAPS Take 500 mg by mouth daily.  . vitamin B-12 (CYANOCOBALAMIN) 1000 MCG tablet Take 1,000 mcg by mouth daily.  . [DISCONTINUED] furosemide (LASIX) 40 MG tablet Take 40 mg by mouth every morning.     Allergies:   Patient has no known allergies.   Social History   Socioeconomic History  . Marital status: Married    Spouse name: Not on file  . Number of children: 2  . Years of education: Not on file  . Highest education level: Not on file  Occupational History  . Occupation: Retired Market researcher at  [employer]  Social Needs  . Financial resource strain: Not on file  . Food insecurity    Worry: Not on file    Inability: Not on file  . Transportation needs    Medical: Not on file    Non-medical: Not on file  Tobacco Use  . Smoking status: Former Smoker    Packs/day: 0.50    Years: 10.00    Pack years: 5.00    Types: Cigarettes    Start date: 01/16/1974    Quit date: 09/05/1983    Years since quitting: 35.5  . Smokeless tobacco: Former Systems developer    Types: Chew  Substance and Sexual Activity  . Alcohol use: Not Currently    Alcohol/week: 0.0 standard drinks  . Drug use: No  . Sexual activity: Not on file  Lifestyle  . Physical activity    Days per week: Not on file    Minutes per session: Not on file  . Stress: Not on file  Relationships  . Social Herbalist on phone: Not on file    Gets together: Not on file    Attends religious service: Not on file    Active member of club or organization: Not on file    Attends meetings of clubs or organizations: Not on file    Relationship status: Not on file  Other Topics Concern  . Not on file  Social History Narrative   . Not on file     Family History:  The patient's   family history includes Arthritis in his father; Heart Problems in his sister; Heart disease in his father; Other in his brother, daughter, mother, and sister; Suicidality in his brother.   ROS:   Please see the history of present illness.    Review of Systems  Constitution: Positive for malaise/fatigue.  Cardiovascular: Positive for dyspnea on exertion and leg swelling.  Musculoskeletal: Positive for arthritis, joint pain, myalgias and stiffness.   All other systems reviewed and are negative.   PHYSICAL EXAM:   VS:  BP (!) 102/46   Pulse 82   Ht 5\' 3"  (1.6 m)   Wt 109 lb 1.9 oz (49.5 kg)   SpO2 94%   BMI 19.33 kg/m   Physical Exam  GEN: Thin, elderly, in no acute distress  neck:slight JVD, no carotid bruits, or masses Cardiac:RRR; 4/6 diastolic aortic murmur 2/6 systolic murmur LSB Respiratory:  clear to auscultation bilaterally, normal work of breathing GI: soft, nontender, nondistended, + BS Ext: plus 2-3 edema  Good distal pulses bilaterally Neuro:  Alert and Oriented x 3, Strength and sensation are intact Psych: euthymic mood, full affect  Wt Readings from Last 3 Encounters:  03/18/19 109 lb 1.9 oz (49.5 kg)  12/31/18 100 lb (45.4 kg)  11/20/18 105 lb (47.6 kg)      Studies/Labs Reviewed:   EKG:  EKG is  ordered today.  The ekg ordered today demonstrates NSR with nonspecific ST changes  Recent Labs: 12/31/2018: BUN 16; Creatinine, Ser 0.97; Platelets 138; Potassium 4.1; Sodium 139 01/01/2019: Hemoglobin 7.4   Lipid Panel    Component Value Date/Time   CHOL  01/14/2008 0450    174        ATP III CLASSIFICATION:  <200     mg/dL   Desirable  200-239  mg/dL   Borderline High  >=240    mg/dL   High   TRIG 43 01/14/2008 0450   HDL 65 01/14/2008 0450   CHOLHDL 2.7 01/14/2008  0450   VLDL 9 01/14/2008 0450   LDLCALC (H) 01/14/2008 0450    100        Total Cholesterol/HDL:CHD Risk Coronary Heart Disease Risk  Table                     Men   Women  1/2 Average Risk   3.4   3.3    Additional studies/ records that were reviewed today include:  2Decho 1/8/20Study Conclusions   - Left ventricle: The cavity size was normal. Systolic function was   normal. Wall motion was normal; there were no regional wall   motion abnormalities. Features are consistent with a pseudonormal   left ventricular filling pattern, with concomitant abnormal   relaxation and increased filling pressure (grade 2 diastolic   dysfunction). - Aortic valve: There was mild regurgitation. Mean gradient (S): 10   mm Hg. - Mitral valve: Calcified annulus. Moderately thickened, moderately   calcified leaflets . There was mild regurgitation. - Left atrium: The atrium was mildly dilated. - Right atrium: The atrium was mildly dilated. - Atrial septum: No defect or patent foramen ovale was identified.   There was redundancy of the septum, with borderline criteria for   aneurysm. - Tricuspid valve: There was moderate regurgitation. - Pulmonary arteries: PA peak pressure: 32 mm Hg (S). - Pericardium, extracardiac: A trivial pericardial effusion was   identified posterior to the heart.   Impressions:   - S/P prior AVR. AV leaflets moderately thickened/calcified, but   gradients within normal range. Normal LV EF. Grade 2 diastolic   dysfunction. Mild MR, moderate TR.   Left ventricle:  The cavity size was normal. Systolic function was normal. Wall motion was normal; there were no regional wall motion abnormalities. Features are consistent with a pseudonormal left ventricular filling pattern, with concomitant abnormal relaxation and increased filling pressure (grade 2 diastolic dysfunction).   ------------------------------------------------------------------- Aortic valve:   Trileaflet; mildly thickened leaflets. Mobility was not restricted.  Doppler:  Transvalvular velocity was within the normal range. There was mild  regurgitation.    VTI ratio of LVOT to aortic valve: 0.41. Valve area (VTI): 1.29 cm^2. Indexed valve area (VTI): 0.91 cm^2/m^2. Peak velocity ratio of LVOT to aortic valve: 0.37. Valve area (Vmax): 1.16 cm^2. Indexed valve area (Vmax): 0.82 cm^2/m^2. Mean velocity ratio of LVOT to aortic valve: 0.38. Valve area (Vmean): 1.19 cm^2. Indexed valve area (Vmean): 0.84 cm^2/m^2.    Mean gradient (S): 10 mm Hg. Peak gradient (S): 21 mm Hg.       ASSESSMENT:    1. Preoperative clearance   2. Murmur   3. Congestive heart failure, unspecified HF chronicity, unspecified heart failure type ([place])   4. Severe aortic stenosis   5. Anemia, unspecified type      PLAN:  In order of problems listed above:  Preoperative clearance for right shoulder surgery possibl Monday by Dr. Veverly Fells. Patient has new CHF and loud AI murmur. Will get stat echo, labs. Cannot clear patient for surgery Monday. According to the Revised Cardiac Risk Index (RCRI), his Perioperative Risk of Major Cardiac Event is (%): 0.9  His Functional Capacity in METs is: 3.63 according to the [facility] Activity Status Index (DASI).    Acute diastolic CHF with increased edema, loud aortic insufficiency murmur. Stat echo now, labs.Increase lasix 40 mg BID 3 days then one daily. Add Kdur 40 meq once daily for 3 days then 1 daily. F/u with Dr. Radford Pax Monday.   S/P AVR  2009 now with significant AS/AI murmur-echo today.  Non obstructive CAD on cath 2009- no chest pain.   Anemia- Hbg 7.9 June 25. Has had in the past. Will check CBC today but need f/u with Dr. Laurann Montana for this.    Medication Adjustments/Labs and Tests Ordered: Current medicines are reviewed at length with the patient today.  Concerns regarding medicines are outlined above.  Medication changes, Labs and Tests ordered today are listed in the Patient Instructions below. Patient Instructions   Medication Instructions:   Your physician has recommended you make the following  change in your medication:   Increase your Lasix to 80MG , 2 tablets once a day for 3 days, then go back to 40MG  1 tablet once a day. Start Potassium 20MEQ, 2 tablets once a day for 3 days, then take 1 tablet once a day.  If you need a refill on your cardiac medications before your next appointment, please call your pharmacy.   Lab work:  You will have labs drawn today: BMET CBC, and BNP  If you have labs (blood work) drawn today and your tests are completely normal, you will receive your results only by: Marland Kitchen MyChart Message (if you have MyChart) OR . A paper copy in the mail If you have any lab test that is abnormal or we need to change your treatment, we will call you to review the results.  Testing/Procedures:  Your physician has requested that you have an echocardiogram TODAY. Echocardiography is a painless test that uses sound waves to create images of your heart. It provides your doctor with information about the size and shape of your heart and how well your heart's chambers and valves are working. This procedure takes approximately one hour. There are no restrictions for this procedure.   Follow-Up:  On 03/24/19 with [name], MD at 2:40PM  Any Other Special Instructions Will Be Listed Below (If Applicable).   Two Gram Sodium Diet 2000 mg  What is Sodium? Sodium is a mineral found naturally in many foods. The most significant source of sodium in the diet is table salt, which is about 40% sodium.  Processed, convenience, and preserved foods also contain a large amount of sodium.  The body needs only 500 mg of sodium daily to function,  A normal diet provides more than enough sodium even if you do not use salt.  Why Limit Sodium? A build up of sodium in the body can cause thirst, increased blood pressure, shortness of breath, and water retention.  Decreasing sodium in the diet can reduce edema and risk of heart attack or stroke associated with high blood pressure.  Keep in mind  that there are many other factors involved in these health problems.  Heredity, obesity, lack of exercise, cigarette smoking, stress and what you eat all play a role.  General Guidelines:  Do not add salt at the table or in cooking.  One teaspoon of salt contains over 2 grams of sodium.  Read food labels  Avoid processed and convenience foods  Ask your dietitian before eating any foods not dicussed in the menu planning guidelines  Consult your physician if you wish to use a salt substitute or a sodium containing medication such as antacids.  Limit milk and milk products to 16 oz (2 cups) per day.  Shopping Hints:  READ LABELS!! "Dietetic" does not necessarily mean low sodium.  Salt and other sodium ingredients are often added to foods during processing.   Menu Planning Guidelines Food Group  Choose More Often Avoid  Beverages (see also the milk group All fruit juices, low-sodium, salt-free vegetables juices, low-sodium carbonated beverages Regular vegetable or tomato juices, commercially softened water used for drinking or cooking  Breads and Cereals Enriched white, wheat, rye and pumpernickel bread, hard rolls and dinner rolls; muffins, cornbread and waffles; most dry cereals, cooked cereal without added salt; unsalted crackers and breadsticks; low sodium or homemade bread crumbs Bread, rolls and crackers with salted tops; quick breads; instant hot cereals; pancakes; commercial bread stuffing; self-rising flower and biscuit mixes; regular bread crumbs or cracker crumbs  Desserts and Sweets Desserts and sweets mad with mild should be within allowance Instant pudding mixes and cake mixes  Fats Butter or margarine; vegetable oils; unsalted salad dressings, regular salad dressings limited to 1 Tbs; light, sour and heavy cream Regular salad dressings containing bacon fat, bacon bits, and salt pork; snack dips made with instant soup mixes or processed cheese; salted nuts  Fruits Most fresh,  frozen and canned fruits Fruits processed with salt or sodium-containing ingredient (some dried fruits are processed with sodium sulfites        Vegetables Fresh, frozen vegetables and low- sodium canned vegetables Regular canned vegetables, sauerkraut, pickled vegetables, and others prepared in brine; frozen vegetables in sauces; vegetables seasoned with ham, bacon or salt pork  Condiments, Sauces, Miscellaneous  Salt substitute with physician's approval; pepper, herbs, spices; vinegar, lemon or lime juice; hot pepper sauce; garlic powder, onion powder, low sodium soy sauce (1 Tbs.); low sodium condiments (ketchup, chili sauce, mustard) in limited amounts (1 tsp.) fresh ground horseradish; unsalted tortilla chips, pretzels, potato chips, popcorn, salsa (1/4 cup) Any seasoning made with salt including garlic salt, celery salt, onion salt, and seasoned salt; sea salt, rock salt, kosher salt; meat tenderizers; monosodium glutamate; mustard, regular soy sauce, barbecue, sauce, chili sauce, teriyaki sauce, steak sauce, Worcestershire sauce, and most flavored vinegars; canned gravy and mixes; regular condiments; salted snack foods, olives, picles, relish, horseradish sauce, catsup   Food preparation: Try these seasonings Meats:    Pork Sage, onion Serve with applesauce  Chicken Poultry seasoning, thyme, parsley Serve with cranberry sauce  Lamb Curry powder, rosemary, garlic, thyme Serve with mint sauce or jelly  Veal Marjoram, basil Serve with current jelly, cranberry sauce  Beef Pepper, bay leaf Serve with dry mustard, unsalted chive butter  Fish [place] leaf, dill Serve with unsalted lemon butter, unsalted parsley butter  Vegetables:    Asparagus Lemon juice   Broccoli Lemon juice   Carrots Mustard dressing parsley, mint, nutmeg, glazed with unsalted butter and sugar   Green beans Marjoram, lemon juice, nutmeg,dill seed   Tomatoes Basil, marjoram, onion   Spice /blend for [employer]" 4 tsp ground  thyme 1 tsp ground sage 3 tsp ground rosemary 4 tsp ground marjoram   Test your knowledge 1. A product that says "Salt Free" Hudson still contain sodium. True or False 2. Garlic Powder and Hot Pepper Sauce an be used as alternative seasonings.True or False 3. Processed foods have more sodium than fresh foods.  True or False 4. Canned Vegetables have less sodium than froze True or False  WAYS TO DECREASE YOUR SODIUM INTAKE 1. Avoid the use of added salt in cooking and at the table.  Table salt (and other prepared seasonings which contain salt) is probably one of the greatest sources of sodium in the diet.  Unsalted foods can gain flavor from the sweet, sour, and butter taste sensations of herbs and spices.  Instead of using salt for seasoning, try the following seasonings with the foods listed.  Remember: how you use them to enhance natural food flavors is limited only by your creativity... Allspice-Meat, fish, eggs, fruit, peas, red and yellow vegetables Almond Extract-Fruit baked goods Anise Seed-Sweet breads, fruit, carrots, beets, cottage cheese, cookies (tastes like licorice) Basil-Meat, fish, eggs, vegetables, rice, vegetables salads, soups, sauces Bay Leaf-Meat, fish, stews, poultry Burnet-Salad, vegetables (cucumber-like flavor) Caraway Seed-Bread, cookies, cottage cheese, meat, vegetables, cheese, rice Cardamon-Baked goods, fruit, soups Celery Powder or seed-Salads, salad dressings, sauces, meatloaf, soup, bread.Do not use  celery salt Chervil-Meats, salads, fish, eggs, vegetables, cottage cheese (parsley-like flavor) Chili Power-Meatloaf, chicken cheese, corn, eggplant, egg dishes Chives-Salads cottage cheese, egg dishes, soups, vegetables, sauces Cilantro-Salsa, casseroles Cinnamon-Baked goods, fruit, pork, lamb, chicken, carrots Cloves-Fruit, baked goods, fish, pot roast, green beans, beets, carrots Coriander-Pastry, cookies, meat, salads, cheese (lemon-orange flavor)  Cumin-Meatloaf, fish,cheese, eggs, cabbage,fruit pie (caraway flavor) [employer], fruit, eggs, fish, poultry, cottage cheese, vegetables Dill Seed-Meat, cottage cheese, poultry, vegetables, fish, salads, bread Fennel Seed-Bread, cookies, apples, pork, eggs, fish, beets, cabbage, cheese, Licorice-like flavor Garlic-(buds or powder) Salads, meat, poultry, fish, bread, butter, vegetables, potatoes.Do not  use garlic salt Ginger-Fruit, vegetables, baked goods, meat, fish, poultry Horseradish Root-Meet, vegetables, butter Lemon Juice or Extract-Vegetables, fruit, tea, baked goods, fish salads Mace-Baked goods fruit, vegetables, fish, poultry (taste like nutmeg) Maple Extract-Syrups Marjoram-Meat, chicken, fish, vegetables, breads, green salads (taste like Sage) Mint-Tea, lamb, sherbet, vegetables, desserts, carrots, cabbage Mustard, Dry or Seed-Cheese, eggs, meats, vegetables, poultry Nutmeg-Baked goods, fruit, chicken, eggs, vegetables, desserts Onion Powder-Meat, fish, poultry, vegetables, cheese, eggs, bread, rice salads (Do not use   Onion salt) Orange Extract-Desserts, baked goods Oregano-Pasta, eggs, cheese, onions, pork, lamb, fish, chicken, vegetables, green salads Paprika-Meat, fish, poultry, eggs, cheese, vegetables Parsley Flakes-Butter, vegetables, meat fish, poultry, eggs, bread, salads (certain forms Hudson   Contain sodium Pepper-Meat fish, poultry, vegetables, eggs Peppermint Extract-Desserts, baked goods Poppy Seed-Eggs, bread, cheese, fruit dressings, baked goods, noodles, vegetables, cottage  [employer], poultry, meat, fish, cauliflower, turnips,eggs bread Saffron-Rice, bread, veal, chicken, fish, eggs Sage-Meat, fish, poultry, onions, eggplant, tomateos, pork, stews Savory-Eggs, salads, poultry, meat, rice, vegetables, soups, pork Tarragon-Meat, poultry, fish, eggs, butter, vegetables (licorice-like flavor)  Thyme-Meat,  poultry, fish, eggs, vegetables, (clover-like flavor), sauces, soups Tumeric-Salads, butter, eggs, fish, rice, vegetables (saffron-like flavor) Vanilla Extract-Baked goods, candy Vinegar-Salads, vegetables, meat marinades Walnut Extract-baked goods, candy  2. Choose your Foods Wisely   The following is a list of foods to avoid which are high in sodium:  Meats-Avoid all smoked, canned, salt cured, dried and kosher meat and fish as well as Anchovies   [employer] meats:Bologna, Liverwurst, Pastrami Canned meat or fish  Marinated herring Caviar    Pepperoni Corned Beef   Pizza Dried chipped beef  Salami Frozen breaded fish or meat Salt pork Frankfurters or hot dogs  Sardines Gefilte fish   Sausage Ham (boiled ham, Proscuitto Smoked butt    spiced ham)   Spam      TV Dinners Vegetables Canned vegetables (Regular) Relish Canned mushrooms  Sauerkraut Olives    Tomato juice Pickles  Bakery and Dessert Products Canned puddings  Cream pies Cheesecake   Decorated cakes Cookies  Beverages/Juices Tomato juice, regular  Gatorade   V-8 vegetable juice, regular  Breads and Cereals Biscuit mixes   Salted potato chips, corn chips, pretzels Bread stuffing mixes  Salted crackers and rolls Pancake and waffle mixes Self-rising  flour  Seasonings Accent    Meat sauces Barbecue sauce  Meat tenderizer Catsup    Monosodium glutamate (MSG) Celery salt   Onion salt Chili sauce   Prepared mustard Garlic salt   Salt, seasoned salt, sea salt Gravy mixes   Soy sauce Horseradish   Steak sauce Ketchup   Tartar sauce Lite salt    Teriyaki sauce Marinade mixes   Worcestershire sauce  Others Baking powder   Cocoa and cocoa mixes Baking soda   Commercial casserole mixes Candy-caramels, chocolate  Dehydrated soups    Bars, fudge,nougats  Instant rice and pasta mixes Canned broth or soup  Maraschino cherries Cheese, aged and processed cheese and cheese spreads  Learning Assessment  Quiz  Indicated T (for True) or F (for False) for each of the following statements:  1. _____ Fresh fruits and vegetables and unprocessed grains are generally low in sodium 2. _____ Water Hudson contain a considerable amount of sodium, depending on the source 3. _____ You can always tell if a food is high in sodium by tasting it 4. _____ Certain laxatives my be high in sodium and should be avoided unless prescribed   by a physician or pharmacist 5. _____ Salt substitutes Hudson be used freely by anyone on a sodium restricted diet 6. _____ Sodium is present in table salt, food additives and as a natural component of   most foods 7. _____ Table salt is approximately 90% sodium 8. _____ Limiting sodium intake Hudson help prevent excess fluid accumulation in the body 9. _____ On a sodium-restricted diet, seasonings such as bouillon soy sauce, and    cooking wine should be used in place of table salt 10. _____ On an ingredient list, a product which lists monosodium glutamate as the first   ingredient is an appropriate food to include on a low sodium diet  Circle the best answer(s) to the following statements (Hint: there Hudson be more than one correct answer)  11. On a low-sodium diet, some acceptable snack items are:    A. Olives  F. Bean dip   K. Grapefruit juice    B. Salted Pretzels G. Commercial Popcorn   L. Canned peaches    C. Carrot Sticks  H. Bouillon   M. Unsalted nuts   D. [place] fries  I. Peanut butter crackers N. Salami   E. Sweet pickles J. Tomato Juice   O. Pizza  12.  Seasonings that Hudson be used freely on a reduced - sodium diet include   A. Lemon wedges F.Monosodium glutamate K. Celery seed    B.Soysauce   G. Pepper   L. Mustard powder   C. Sea salt  H. Cooking wine  M. Onion flakes   D. Vinegar  E. Prepared horseradish N. Salsa   E. Sage   J. Worcestershire sauce  O. [address]      Sumner Boast, PA-C  03/18/2019 3:04 PM    [facility]  [place], [place], [place]  [address] Phone: [phone]; Fax: [phone]

## 2019-03-19 LAB — BASIC METABOLIC PANEL
BUN/Creatinine Ratio: 17 (ref 10–24)
BUN: 19 mg/dL (ref 8–27)
CO2: 27 mmol/L (ref 20–29)
Calcium: 8.9 mg/dL (ref 8.6–10.2)
Chloride: 101 mmol/L (ref 96–106)
Creatinine, Ser: 1.12 mg/dL (ref 0.76–1.27)
GFR calc Af Amer: 70 mL/min/{1.73_m2} (ref >59)
GFR calc non Af Amer: 61 mL/min/{1.73_m2} (ref >59)
Glucose: 91 mg/dL (ref 65–99)
Potassium: 4.9 mmol/L (ref 3.5–5.2)
Sodium: 142 mmol/L (ref 134–144)

## 2019-03-19 LAB — CBC
Hematocrit: 27.7 % — ABNORMAL LOW (ref 37.5–51.0)
Hemoglobin: 8.3 g/dL — ABNORMAL LOW (ref 13.0–17.7)
MCH: 23.2 pg — ABNORMAL LOW (ref 26.6–33.0)
MCHC: 30 g/dL — ABNORMAL LOW (ref 31.5–35.7)
MCV: 77 fL — ABNORMAL LOW (ref 79–97)
Platelets: 110 10*3/uL — ABNORMAL LOW (ref 150–450)
RBC: 3.58 x10E6/uL — ABNORMAL LOW (ref 4.14–5.80)
RDW: 20.4 % — ABNORMAL HIGH (ref 11.6–15.4)
WBC: 5.6 10*3/uL (ref 3.4–10.8)

## 2019-03-19 LAB — PRO B NATRIURETIC PEPTIDE: NT-Pro BNP: 3511 pg/mL — ABNORMAL HIGH (ref 0–486)

## 2019-03-21 ENCOUNTER — Telehealth: Payer: Self-pay | Admitting: Cardiology

## 2019-03-21 NOTE — Telephone Encounter (Signed)

## 2019-03-24 ENCOUNTER — Other Ambulatory Visit: Payer: Self-pay

## 2019-03-24 ENCOUNTER — Encounter: Payer: Self-pay | Admitting: Cardiology

## 2019-03-24 ENCOUNTER — Ambulatory Visit (INDEPENDENT_AMBULATORY_CARE_PROVIDER_SITE_OTHER): Payer: Medicare Other | Admitting: Cardiology

## 2019-03-24 ENCOUNTER — Encounter: Payer: Self-pay | Admitting: *Deleted

## 2019-03-24 VITALS — BP 97/38 | HR 69 | Ht 63.0 in | Wt 105.2 lb

## 2019-03-24 DIAGNOSIS — I351 Nonrheumatic aortic (valve) insufficiency: Secondary | ICD-10-CM | POA: Diagnosis not present

## 2019-03-24 DIAGNOSIS — I5032 Chronic diastolic (congestive) heart failure: Secondary | ICD-10-CM | POA: Insufficient documentation

## 2019-03-24 DIAGNOSIS — I2510 Atherosclerotic heart disease of native coronary artery without angina pectoris: Secondary | ICD-10-CM | POA: Diagnosis not present

## 2019-03-24 DIAGNOSIS — Z01812 Encounter for preprocedural laboratory examination: Secondary | ICD-10-CM

## 2019-03-24 DIAGNOSIS — R0602 Shortness of breath: Secondary | ICD-10-CM

## 2019-03-24 DIAGNOSIS — R600 Localized edema: Secondary | ICD-10-CM

## 2019-03-24 DIAGNOSIS — I350 Nonrheumatic aortic (valve) stenosis: Secondary | ICD-10-CM | POA: Diagnosis not present

## 2019-03-24 HISTORY — DX: Nonrheumatic aortic (valve) insufficiency: I35.1

## 2019-03-24 HISTORY — DX: Chronic diastolic (congestive) heart failure: I50.32

## 2019-03-24 NOTE — Progress Notes (Signed)
Cardiology Office Note:    Date:  03/24/2019   ID:  Joshua Hudson, DOB 1935-03-13, MRN [id number]  PCP:  Lavone Orn, MD  Cardiologist:  [name], MD    Referring MD: Lavone Orn, MD   Chief Complaint  Patient presents with  . Coronary Artery Disease  . Congestive Heart Failure  . Hypertension  . Aortic Stenosis    History of Present Illness:    Joshua Hudson is a 84 y.o. male with history of severe AS s/p AVR 2009 with post op afib-no recurrence, nonobstructive CAD cath 2009-20-30% OM2.  Last echo 09/11/18 normal LVEF grade 2 DD, mild MR, Mod TR, S/P AVT gradients in normal range.  Patient needs surgical clearance for right shoulder surgery by Dr. Veverly Fells. Has had 3 shoulder surgeries since last March. Denies chest pain. Legs started swelling 3-4 weeks ago. Has had dyspnea on exertion with strenuous activity but not with everyday living until a few weeks ago when he had to stop when walking into [employer] -he was so short of breath.  Short of breath vacuuming and walking in today. Was given Lasix 40 mg daily by Dr. Laurann Montana and it hasn't helped. Gets short of breath if he overdoes it. Eats ham sandwiches and at [employer] 3-4 times/week. Hbg 7.9 June 25. Has chronic anemia and said he had a transfusion last shoulder surgery.  He is back today to discuss his sx as well as recent echo showing at least moderate AR.  He tells me that he has chronic DOE that is worse if he really pushes himself.  His legs are very swollen today but he says they were worse a week ago.  He denies any chest pain.  He has not had any PND, orthopnea, dizziness or syncope.  No palpitations.   Past Medical History:  Diagnosis Date  . Anemia    Previous history of anemia  . CHF (congestive heart failure) (HCC)    Pt states "[name] dx with CHF a week ago per pre-op call on 11/19/18"  . Colon polyps    s/p diverticular perforation requiring 2-stage repair  . Coronary artery disease    a. 20-30%  OM2 by cath 2009. b. nonobstructive of all 3 vessels by coronary CTA 2015.  . Diverticulosis   . DJD (degenerative joint disease), lumbosacral   . Esophageal stricture   . GERD (gastroesophageal reflux disease)   . History of blood transfusion    patient states "years ago"  . History of kidney stones    passed stones  . Hypertension   . Mitral regurgitation 05/16/2016   mild by echo 05/2016  . Occipital neuralgia   . Pneumonia   . Postoperative atrial fibrillation ([place]) 05/10/2015  . Rheumatoid arthritis ([place])    s/o long term steroids  shoulders and hands  . Rotator cuff arthropathy    right  . S/P aortic valve replacement with tissue   . SBE (subacute bacterial endocarditis) prophylaxis candidate    for dental procedures  . Severe aortic stenosis    S/P prosthetic valve replacement w 25 mm Edwards like science percardial tissue valve,[place] - 01/2008    Past Surgical History:  Procedure Laterality Date  . APPENDECTOMY    . BOTOX INJECTION N/A 01/22/2018   Procedure: BOTOX INJECTION;  Surgeon: Ronnette Juniper, MD;  Location: [facility];  Service: Gastroenterology;  Laterality: N/A;  . CARDIAC CATHETERIZATION     09  . CARDIAC VALVE REPLACEMENT  01/2008   aortic  valve replacement  . CATARACT EXTRACTION W/ INTRAOCULAR LENS  IMPLANT, BILATERAL    . COLON RESECTION     diverticulitis   . dental implants     permanent  . ESOPHAGEAL MANOMETRY N/A 11/07/2017   Procedure: ESOPHAGEAL MANOMETRY (EM);  Surgeon: Ronnette Juniper, MD;  Location: [facility];  Service: Gastroenterology;  Laterality: N/A;  . ESOPHAGOGASTRODUODENOSCOPY (EGD) WITH PROPOFOL N/A 01/22/2018   Procedure: ESOPHAGOGASTRODUODENOSCOPY (EGD) WITH PROPOFOL;  Surgeon: Ronnette Juniper, MD;  Location: [facility];  Service: Gastroenterology;  Laterality: N/A;  . EXCISIONAL TOTAL SHOULDER ARTHROPLASTY WITH ANTIBIOTIC SPACER Right 12/31/2018   Procedure: EXCISIONAL TOTAL SHOULDER ARTHROPLASTY WITH ANTIBIOTIC SPACER;  Surgeon: Netta Cedars, MD;  Location: [place];  Service: Orthopedics;  Laterality: Right;  . HERNIA REPAIR    . IRRIGATION AND DEBRIDEMENT SHOULDER Right 11/20/2018    IRRIGATION AND DEBRIDEMENT SHOULDER WITH POLY EXCHANGE (Right Shoulder)  . IRRIGATION AND DEBRIDEMENT SHOULDER Right 11/20/2018   Procedure: IRRIGATION AND DEBRIDEMENT SHOULDER WITH POLY EXCHANGE;  Surgeon: Netta Cedars, MD;  Location: [facility];  Service: Orthopedics;  Laterality: Right;  . LUMBAR LAMINECTOMY     x 2  . REVERSE SHOULDER ARTHROPLASTY Right 03/01/2018   Procedure: RIGHT REVERSE SHOULDER ARTHROPLASTY;  Surgeon: Netta Cedars, MD;  Location: [facility];  Service: Orthopedics;  Laterality: Right;  . SAVORY DILATION N/A 01/22/2018   Procedure: SAVORY DILATION;  Surgeon: Ronnette Juniper, MD;  Location: [facility];  Service: Gastroenterology;  Laterality: N/A;  . SHOULDER HEMI-ARTHROPLASTY Right 06/14/2018   Procedure: RIGHT  REVERSE TOTAL SHOULDER OPEN POLY EXCHANGE;  Surgeon: Netta Cedars, MD;  Location: [facility];  Service: Orthopedics;  Laterality: Right;  . TEE WITHOUT CARDIOVERSION N/A 01/29/2014   Procedure: TRANSESOPHAGEAL ECHOCARDIOGRAM (TEE);  Surgeon: Sueanne Margarita, MD;  Location: [facility];  Service: Cardiovascular;  Laterality: N/A;  . TONSILLECTOMY      Current Medications: Current Meds  Medication Sig  . acetaminophen (TYLENOL) 500 MG tablet Take 1,000 mg by mouth every 6 (six) hours as needed for moderate pain or headache.  Marland Kitchen aspirin EC 81 MG EC tablet Take 1 tablet (81 mg total) by mouth daily.  . cholecalciferol (VITAMIN D3) 25 MCG (1000 UT) tablet Take 1,000 Units by mouth daily.  . ferrous sulfate 325 (65 FE) MG tablet Take 325 mg by mouth daily with breakfast.  . folic acid (FOLVITE) 1 MG tablet Take 1 mg by mouth daily.  . furosemide (LASIX) 40 MG tablet Take 2 tablets(80MG ) by mouth once a day for 3 days, then decrease to 1 tablet(40MG ) by mouth once a day  . leflunomide (ARAVA) 20 MG tablet Take 20 mg by mouth daily.  Marland Kitchen  MEGARED OMEGA-3 KRILL OIL PO Take 750 mg by mouth daily.   . methotrexate (RHEUMATREX) 2.5 MG tablet Take 25 mg by mouth every Monday. In the morning. Caution:Chemotherapy. Protect from light.  . metoprolol succinate (TOPROL-XL) 25 MG 24 hr tablet Take 1 tablet by mouth once daily  . Multiple Vitamin (MULTIVITAMIN WITH MINERALS) TABS tablet Take 1 tablet by mouth daily.  Marland Kitchen oxyCODONE (ROXICODONE) 5 MG immediate release tablet Take 1 tablet (5 mg total) by mouth every 4 (four) hours as needed for severe pain or breakthrough pain.  . pantoprazole (PROTONIX) 40 MG tablet Take 40 mg by mouth daily before breakfast.   . potassium chloride SA (K-DUR) 20 MEQ tablet Take 2 tablets(40MEQ) by mouth once a day for 3 days, then decrease to 1 tablet(20MEQ) by mouth once a day.  Marland Kitchen  predniSONE (DELTASONE) 5 MG tablet Take 5 mg by mouth daily with breakfast.   . THERATEARS 0.25 % SOLN Place 1 drop into both eyes 3 (three) times daily as needed (dry/irritated eyes.).  Marland Kitchen Turmeric 500 MG CAPS Take 500 mg by mouth daily.  . vitamin B-12 (CYANOCOBALAMIN) 1000 MCG tablet Take 1,000 mcg by mouth daily.     Allergies:   Patient has no known allergies.   Social History   Socioeconomic History  . Marital status: Married    Spouse name: Not on file  . Number of children: 2  . Years of education: Not on file  . Highest education level: Not on file  Occupational History  . Occupation: Retired Market researcher at [employer]  . Financial resource strain: Not on file  . Food insecurity    Worry: Not on file    Inability: Not on file  . Transportation needs    Medical: Not on file    Non-medical: Not on file  Tobacco Use  . Smoking status: Former Smoker    Packs/day: 0.50    Years: 10.00    Pack years: 5.00    Types: Cigarettes    Start date: 01/16/1974    Quit date: 09/05/1983    Years since quitting: 35.5  . Smokeless tobacco: Former Systems developer    Types: Chew  Substance and Sexual Activity  . Alcohol use: Not  Currently    Alcohol/week: 0.0 standard drinks  . Drug use: No  . Sexual activity: Not on file  Lifestyle  . Physical activity    Days per week: Not on file    Minutes per session: Not on file  . Stress: Not on file  Relationships  . Social Herbalist on phone: Not on file    Gets together: Not on file    Attends religious service: Not on file    Active member of club or organization: Not on file    Attends meetings of clubs or organizations: Not on file    Relationship status: Not on file  Other Topics Concern  . Not on file  Social History Narrative  . Not on file     Family History: The patient's family history includes Arthritis in his father; Heart Problems in his sister; Heart disease in his father; Other in his brother, daughter, mother, and sister; Suicidality in his brother.  ROS:   Please see the history of present illness.    ROS  All other systems reviewed and negative.   EKGs/Labs/Other Studies Reviewed:    The following studies were reviewed today: 2D echo  EKG:  EKG is not ordered today.   Recent Labs: 03/18/2019: BUN 19; Creatinine, Ser 1.12; Hemoglobin 8.3; NT-Pro BNP 3,511; Platelets 110; Potassium 4.9; Sodium 142   Recent Lipid Panel    Component Value Date/Time   CHOL  01/14/2008 0450    174        ATP III CLASSIFICATION:  <200     mg/dL   Desirable  200-239  mg/dL   Borderline High  >=240    mg/dL   High   TRIG 43 01/14/2008 0450   HDL 65 01/14/2008 0450   CHOLHDL 2.7 01/14/2008 0450   VLDL 9 01/14/2008 0450   LDLCALC (H) 01/14/2008 0450    100        Total Cholesterol/HDL:CHD Risk Coronary Heart Disease Risk Table  Men   Women  1/2 Average Risk   3.4   3.3    Physical Exam:    VS:  BP (!) 97/38 (BP Location: Left Arm, Patient Position: Sitting, Cuff Size: Normal) Comment: repeated manually 102/40  Pulse 69   Ht 5\' 3"  (1.6 m)   Wt 105 lb 3.2 oz (47.7 kg)   SpO2 98%   BMI 18.64 kg/m     Wt Readings  from Last 3 Encounters:  03/24/19 105 lb 3.2 oz (47.7 kg)  03/18/19 109 lb 1.9 oz (49.5 kg)  12/31/18 100 lb (45.4 kg)     GEN:  Well nourished, well developed in no acute distress HEENT: Normal NECK: No JVD; No carotid bruits LYMPHATICS: No lymphadenopathy CARDIAC: RRR, no  rubs, gallops.  2/6 diastolic murmur at the LUSB to apex RESPIRATORY:  Clear to auscultation without rales, wheezing or rhonchi  ABDOMEN: Soft, non-tender, non-distended MUSCULOSKELETAL:  2+ LE edema; No deformity  SKIN: Warm and dry NEUROLOGIC:  Alert and oriented x 3 PSYCHIATRIC:  Normal affect   ASSESSMENT:    No diagnosis found. PLAN:    In order of problems listed above:  1. Aortic Insufficiency -recent 2D echo showed at least moderate AR of his bioprosthesis -now having CHF sx.  -I have recommended TEE for further assessment of AVR  2.  Severe AS -s/p remote AVR for severe AS in 2009 with bioprosthetic 62mm Edwards Science pericardial tissue AVR -no has at least moderate AR by recent echo - see #1  3.  ASCAD -nonobstructive by cath 2009 with 20-30% OM2 -nonobstructive by coronary CTA 2015 in all 3 vessels -he has not had any anginal sx but does have worsening SOB -will reassess coronary anatomy with LHC at time of RHC given worsening SOB -continue ASA, BB  4.  SOB -suspect multifactorial from at least moderate AI, anemia, CHF and possible ischemia -I have recommended right and left heart cath to assess filling pressures and coronary anatomy -Cardiac catheterization was discussed with the patient fully. The patient understands that risks include but are not limited to stroke (1 in 1000), death (1 in 26), kidney failure [usually temporary] (1 in 500), bleeding (1 in 200), allergic reaction [possibly serious] (1 in 200).  The patient understands and is willing to proceed.   -hold diuretics the day before and day of cath  5.  LE edema -he tells me his LE edema has improved but he still has  significant swelling in both legs and feet -suspect exacerbated by dietary indiscretion with Na -encouraged him to follow a < 2gm Na diet -continue Lasix 40mg  daily -check BMET prior to cath  6.  Chronic diastolic CHF -likely multifactorial due to anemia, dietary indiscretion from Na and possibly exacerbated by AI. -continue diuretics -plan right heart cath as above -2D echo showed restrictive physiology   Medication Adjustments/Labs and Tests Ordered: Current medicines are reviewed at length with the patient today.  Concerns regarding medicines are outlined above.  No orders of the defined types were placed in this encounter.  No orders of the defined types were placed in this encounter.   Signed, [name], MD  03/24/2019 2:42 PM    [facility]

## 2019-03-24 NOTE — H&P (View-Only) (Signed)
Cardiology Office Note:    Date:  03/24/2019   ID:  [name], DOB 1935/07/04, MRN [id number]  PCP:  Lavone Orn, MD  Cardiologist:  [name], MD    Referring MD: Lavone Orn, MD   Chief Complaint  Patient presents with  . Coronary Artery Disease  . Congestive Heart Failure  . Hypertension  . Aortic Stenosis    History of Present Illness:    Joshua Hudson is a 84 y.o. male with history of severe AS s/p AVR 2009 with post op afib-no recurrence, nonobstructive CAD cath 2009-20-30% OM2.  Last echo 09/11/18 normal LVEF grade 2 DD, mild MR, Mod TR, S/P AVT gradients in normal range.  Patient needs surgical clearance for right shoulder surgery by Dr. Veverly Fells. Has had 3 shoulder surgeries since last March. Denies chest pain. Legs started swelling 3-4 weeks ago. Has had dyspnea on exertion with strenuous activity but not with everyday living until a few weeks ago when he had to stop when walking into [employer] -he was so short of breath.  Short of breath vacuuming and walking in today. Was given Lasix 40 mg daily by Dr. Laurann Montana and it hasn't helped. Gets short of breath if he overdoes it. Eats ham sandwiches and at [employer] 3-4 times/week. Hbg 7.9 June 25. Has chronic anemia and said he had a transfusion last shoulder surgery.  He is back today to discuss his sx as well as recent echo showing at least moderate AR.  He tells me that he has chronic DOE that is worse if he really pushes himself.  His legs are very swollen today but he says they were worse a week ago.  He denies any chest pain.  He has not had any PND, orthopnea, dizziness or syncope.  No palpitations.   Past Medical History:  Diagnosis Date  . Anemia    Previous history of anemia  . CHF (congestive heart failure) (HCC)    Pt states "[name] dx with CHF a week ago per pre-op call on 11/19/18"  . Colon polyps    s/p diverticular perforation requiring 2-stage repair  . Coronary artery disease    a. 20-30%  OM2 by cath 2009. b. nonobstructive of all 3 vessels by coronary CTA 2015.  . Diverticulosis   . DJD (degenerative joint disease), lumbosacral   . Esophageal stricture   . GERD (gastroesophageal reflux disease)   . History of blood transfusion    patient states "years ago"  . History of kidney stones    passed stones  . Hypertension   . Mitral regurgitation 05/16/2016   mild by echo 05/2016  . Occipital neuralgia   . Pneumonia   . Postoperative atrial fibrillation ([facility]) 05/10/2015  . Rheumatoid arthritis ([place])    s/o long term steroids  shoulders and hands  . Rotator cuff arthropathy    right  . S/P aortic valve replacement with tissue   . SBE (subacute bacterial endocarditis) prophylaxis candidate    for dental procedures  . Severe aortic stenosis    S/P prosthetic valve replacement w 25 mm Edwards like science percardial tissue valve,Jyles Sontag - 01/2008    Past Surgical History:  Procedure Laterality Date  . APPENDECTOMY    . BOTOX INJECTION N/A 01/22/2018   Procedure: BOTOX INJECTION;  Surgeon: Ronnette Juniper, MD;  Location: [facility];  Service: Gastroenterology;  Laterality: N/A;  . CARDIAC CATHETERIZATION     09  . CARDIAC VALVE REPLACEMENT  01/2008   aortic  valve replacement  . CATARACT EXTRACTION W/ INTRAOCULAR LENS  IMPLANT, BILATERAL    . COLON RESECTION     diverticulitis   . dental implants     permanent  . ESOPHAGEAL MANOMETRY N/A 11/07/2017   Procedure: ESOPHAGEAL MANOMETRY (EM);  Surgeon: Ronnette Juniper, MD;  Location: [facility];  Service: Gastroenterology;  Laterality: N/A;  . ESOPHAGOGASTRODUODENOSCOPY (EGD) WITH PROPOFOL N/A 01/22/2018   Procedure: ESOPHAGOGASTRODUODENOSCOPY (EGD) WITH PROPOFOL;  Surgeon: Ronnette Juniper, MD;  Location: [facility];  Service: Gastroenterology;  Laterality: N/A;  . EXCISIONAL TOTAL SHOULDER ARTHROPLASTY WITH ANTIBIOTIC SPACER Right 12/31/2018   Procedure: EXCISIONAL TOTAL SHOULDER ARTHROPLASTY WITH ANTIBIOTIC SPACER;  Surgeon: Netta Cedars, MD;  Location: [place];  Service: Orthopedics;  Laterality: Right;  . HERNIA REPAIR    . IRRIGATION AND DEBRIDEMENT SHOULDER Right 11/20/2018    IRRIGATION AND DEBRIDEMENT SHOULDER WITH POLY EXCHANGE (Right Shoulder)  . IRRIGATION AND DEBRIDEMENT SHOULDER Right 11/20/2018   Procedure: IRRIGATION AND DEBRIDEMENT SHOULDER WITH POLY EXCHANGE;  Surgeon: Netta Cedars, MD;  Location: [place];  Service: Orthopedics;  Laterality: Right;  . LUMBAR LAMINECTOMY     x 2  . REVERSE SHOULDER ARTHROPLASTY Right 03/01/2018   Procedure: RIGHT REVERSE SHOULDER ARTHROPLASTY;  Surgeon: Netta Cedars, MD;  Location: [place];  Service: Orthopedics;  Laterality: Right;  . SAVORY DILATION N/A 01/22/2018   Procedure: SAVORY DILATION;  Surgeon: Ronnette Juniper, MD;  Location: [facility];  Service: Gastroenterology;  Laterality: N/A;  . SHOULDER HEMI-ARTHROPLASTY Right 06/14/2018   Procedure: RIGHT  REVERSE TOTAL SHOULDER OPEN POLY EXCHANGE;  Surgeon: Netta Cedars, MD;  Location: [place];  Service: Orthopedics;  Laterality: Right;  . TEE WITHOUT CARDIOVERSION N/A 01/29/2014   Procedure: TRANSESOPHAGEAL ECHOCARDIOGRAM (TEE);  Surgeon: Sueanne Margarita, MD;  Location: [facility] END[facility]COPY;  Service: Cardiovascular;  Laterality: N/A;  . TONSILLECTOMY      Current Medications: Current Meds  Medication Sig  . acetaminophen (TYLENOL) 500 MG tablet Take 1,000 mg by mouth every 6 (six) hours as needed for moderate pain or headache.  Marland Kitchen aspirin EC 81 MG EC tablet Take 1 tablet (81 mg total) by mouth daily.  . cholecalciferol (VITAMIN D3) 25 MCG (1000 UT) tablet Take 1,000 Units by mouth daily.  . ferrous sulfate 325 (65 FE) MG tablet Take 325 mg by mouth daily with breakfast.  . folic acid (FOLVITE) 1 MG tablet Take 1 mg by mouth daily.  . furosemide (LASIX) 40 MG tablet Take 2 tablets(80MG ) by mouth once a day for 3 days, then decrease to 1 tablet(40MG ) by mouth once a day  . leflunomide (ARAVA) 20 MG tablet Take 20 mg by mouth daily.  Marland Kitchen  MEGARED OMEGA-3 KRILL OIL PO Take 750 mg by mouth daily.   . methotrexate (RHEUMATREX) 2.5 MG tablet Take 25 mg by mouth every Monday. In the morning. Caution:Chemotherapy. Protect from light.  . metoprolol succinate (TOPROL-XL) 25 MG 24 hr tablet Take 1 tablet by mouth once daily  . Multiple Vitamin (MULTIVITAMIN WITH MINERALS) TABS tablet Take 1 tablet by mouth daily.  Marland Kitchen oxyCODONE (ROXICODONE) 5 MG immediate release tablet Take 1 tablet (5 mg total) by mouth every 4 (four) hours as needed for severe pain or breakthrough pain.  . pantoprazole (PROTONIX) 40 MG tablet Take 40 mg by mouth daily before breakfast.   . potassium chloride SA (K-DUR) 20 MEQ tablet Take 2 tablets(40MEQ) by mouth once a day for 3 days, then decrease to 1 tablet(20MEQ) by mouth once a day.  Marland Kitchen  predniSONE (DELTASONE) 5 MG tablet Take 5 mg by mouth daily with breakfast.   . THERATEARS 0.25 % SOLN Place 1 drop into both eyes 3 (three) times daily as needed (dry/irritated eyes.).  Marland Kitchen Turmeric 500 MG CAPS Take 500 mg by mouth daily.  . vitamin B-12 (CYANOCOBALAMIN) 1000 MCG tablet Take 1,000 mcg by mouth daily.     Allergies:   Patient has no known allergies.   Social History   Socioeconomic History  . Marital status: Married    Spouse name: Not on file  . Number of children: 2  . Years of education: Not on file  . Highest education level: Not on file  Occupational History  . Occupation: Retired Market researcher at [employer]  . Financial resource strain: Not on file  . Food insecurity    Worry: Not on file    Inability: Not on file  . Transportation needs    Medical: Not on file    Non-medical: Not on file  Tobacco Use  . Smoking status: Former Smoker    Packs/day: 0.50    Years: 10.00    Pack years: 5.00    Types: Cigarettes    Start date: 01/16/1974    Quit date: 09/05/1983    Years since quitting: 35.5  . Smokeless tobacco: Former Systems developer    Types: Chew  Substance and Sexual Activity  . Alcohol use: Not  Currently    Alcohol/week: 0.0 standard drinks  . Drug use: No  . Sexual activity: Not on file  Lifestyle  . Physical activity    Days per week: Not on file    Minutes per session: Not on file  . Stress: Not on file  Relationships  . Social Herbalist on phone: Not on file    Gets together: Not on file    Attends religious service: Not on file    Active member of club or organization: Not on file    Attends meetings of clubs or organizations: Not on file    Relationship status: Not on file  Other Topics Concern  . Not on file  Social History Narrative  . Not on file     Family History: The patient's family history includes Arthritis in his father; Heart Problems in his sister; Heart disease in his father; Other in his brother, daughter, mother, and sister; Suicidality in his brother.  ROS:   Please see the history of present illness.    ROS  All other systems reviewed and negative.   EKGs/Labs/Other Studies Reviewed:    The following studies were reviewed today: 2D echo  EKG:  EKG is not ordered today.   Recent Labs: 03/18/2019: BUN 19; Creatinine, Ser 1.12; Hemoglobin 8.3; NT-Pro BNP 3,511; Platelets 110; Potassium 4.9; Sodium 142   Recent Lipid Panel    Component Value Date/Time   CHOL  01/14/2008 0450    174        ATP III CLASSIFICATION:  <200     mg/dL   Desirable  200-239  mg/dL   Borderline High  >=240    mg/dL   High   TRIG 43 01/14/2008 0450   HDL 65 01/14/2008 0450   CHOLHDL 2.7 01/14/2008 0450   VLDL 9 01/14/2008 0450   LDLCALC (H) 01/14/2008 0450    100        Total Cholesterol/HDL:CHD Risk Coronary Heart Disease Risk Table  Men   Women  1/2 Average Risk   3.4   3.3    Physical Exam:    VS:  BP (!) 97/38 (BP Location: Left Arm, Patient Position: Sitting, Cuff Size: Normal) Comment: repeated manually 102/40  Pulse 69   Ht 5\' 3"  (1.6 m)   Wt 105 lb 3.2 oz (47.7 kg)   SpO2 98%   BMI 18.64 kg/m     Wt Readings  from Last 3 Encounters:  03/24/19 105 lb 3.2 oz (47.7 kg)  03/18/19 109 lb 1.9 oz (49.5 kg)  12/31/18 100 lb (45.4 kg)     GEN:  Well nourished, well developed in no acute distress HEENT: Normal NECK: No JVD; No carotid bruits LYMPHATICS: No lymphadenopathy CARDIAC: RRR, no  rubs, gallops.  2/6 diastolic murmur at the LUSB to apex RESPIRATORY:  Clear to auscultation without rales, wheezing or rhonchi  ABDOMEN: Soft, non-tender, non-distended MUSCULOSKELETAL:  2+ LE edema; No deformity  SKIN: Warm and dry NEUROLOGIC:  Alert and oriented x 3 PSYCHIATRIC:  Normal affect   ASSESSMENT:    No diagnosis found. PLAN:    In order of problems listed above:  1. Aortic Insufficiency -recent 2D echo showed at least moderate AR of his bioprosthesis -now having CHF sx.  -I have recommended TEE for further assessment of AVR  2.  Severe AS -s/p remote AVR for severe AS in 2009 with bioprosthetic 43mm Edwards Science pericardial tissue AVR -no has at least moderate AR by recent echo - see #1  3.  ASCAD -nonobstructive by cath 2009 with 20-30% OM2 -nonobstructive by coronary CTA 2015 in all 3 vessels -he has not had any anginal sx but does have worsening SOB -will reassess coronary anatomy with LHC at time of RHC given worsening SOB -continue ASA, BB  4.  SOB -suspect multifactorial from at least moderate AI, anemia, CHF and possible ischemia -I have recommended right and left heart cath to assess filling pressures and coronary anatomy -Cardiac catheterization was discussed with the patient fully. The patient understands that risks include but are not limited to stroke (1 in 1000), death (1 in 59), kidney failure [usually temporary] (1 in 500), bleeding (1 in 200), allergic reaction [possibly serious] (1 in 200).  The patient understands and is willing to proceed.   -hold diuretics the day before and day of cath  5.  LE edema -he tells me his LE edema has improved but he still has  significant swelling in both legs and feet -suspect exacerbated by dietary indiscretion with Na -encouraged him to follow a < 2gm Na diet -continue Lasix 40mg  daily -check BMET prior to cath  6.  Chronic diastolic CHF -likely multifactorial due to anemia, dietary indiscretion from Na and possibly exacerbated by AI. -continue diuretics -plan right heart cath as above -2D echo showed restrictive physiology   Medication Adjustments/Labs and Tests Ordered: Current medicines are reviewed at length with the patient today.  Concerns regarding medicines are outlined above.  No orders of the defined types were placed in this encounter.  No orders of the defined types were placed in this encounter.   Signed, [name], MD  03/24/2019 2:42 PM    [facility]

## 2019-03-24 NOTE — Patient Instructions (Addendum)
Your physician recommends that you continue on your current medications as directed. Please refer to the Current Medication list given to you today.  Your physician recommends that you return for lab work in: [place] has requested that you have a TEE. During a TEE, sound waves are used to create images of your heart. It provides your doctor with information about the size and shape of your heart and how well your heart's chambers and valves are working. In this test, a transducer is attached to the end of a flexible tube that's guided down your throat and into your esophagus (the tube leading from you mouth to your stomach) to get a more detailed image of your heart. You are not awake for the procedure. Please see the instruction sheet given to you today. For further information please visit [email or web address].   Your physician has requested that you have a cardiac catheterization. Cardiac catheterization is used to diagnose and/or treat various heart conditions. Doctors may recommend this procedure for a number of different reasons. The most common reason is to evaluate chest pain. Chest pain can be a symptom of coronary artery disease (CAD), and cardiac catheterization can show whether plaque is narrowing or blocking your heart's arteries. This procedure is also used to evaluate the valves, as well as measure the blood flow and oxygen levels in different parts of your heart. For further information please visit [email or web address]. Please follow instruction sheet, as given. R AND L CATH  Your physician recommends that you schedule a follow-up appointment in:  [place]

## 2019-03-25 ENCOUNTER — Other Ambulatory Visit: Payer: Self-pay | Admitting: Internal Medicine

## 2019-03-25 DIAGNOSIS — R918 Other nonspecific abnormal finding of lung field: Secondary | ICD-10-CM

## 2019-03-25 LAB — BASIC METABOLIC PANEL
BUN/Creatinine Ratio: 19 (ref 10–24)
BUN: 25 mg/dL (ref 8–27)
CO2: 29 mmol/L (ref 20–29)
Calcium: 9.3 mg/dL (ref 8.6–10.2)
Chloride: 101 mmol/L (ref 96–106)
Creatinine, Ser: 1.3 mg/dL — ABNORMAL HIGH (ref 0.76–1.27)
GFR calc Af Amer: 59 mL/min/{1.73_m2} — ABNORMAL LOW (ref >59)
GFR calc non Af Amer: 51 mL/min/{1.73_m2} — ABNORMAL LOW (ref >59)
Glucose: 99 mg/dL (ref 65–99)
Potassium: 4.2 mmol/L (ref 3.5–5.2)
Sodium: 142 mmol/L (ref 134–144)

## 2019-03-25 LAB — CBC
Hematocrit: 26.9 % — ABNORMAL LOW (ref 37.5–51.0)
Hemoglobin: 7.8 g/dL — ABNORMAL LOW (ref 13.0–17.7)
MCH: 22.5 pg — ABNORMAL LOW (ref 26.6–33.0)
MCHC: 29 g/dL — ABNORMAL LOW (ref 31.5–35.7)
MCV: 78 fL — ABNORMAL LOW (ref 79–97)
NRBC: 1 % — ABNORMAL HIGH (ref 0–0)
Platelets: 172 10*3/uL (ref 150–450)
RBC: 3.46 x10E6/uL — ABNORMAL LOW (ref 4.14–5.80)
RDW: 21.1 % — ABNORMAL HIGH (ref 11.6–15.4)
WBC: 5.8 10*3/uL (ref 3.4–10.8)

## 2019-03-27 ENCOUNTER — Other Ambulatory Visit: Payer: Self-pay | Admitting: *Deleted

## 2019-03-27 DIAGNOSIS — E876 Hypokalemia: Secondary | ICD-10-CM

## 2019-03-28 ENCOUNTER — Other Ambulatory Visit: Payer: Self-pay

## 2019-03-28 ENCOUNTER — Encounter (HOSPITAL_COMMUNITY): Payer: Medicare Other

## 2019-03-28 ENCOUNTER — Ambulatory Visit (HOSPITAL_COMMUNITY)
Admission: RE | Admit: 2019-03-28 | Discharge: 2019-03-28 | Disposition: A | Discharge status: Home / Self Care | Payer: Medicare Other | Source: Ambulatory Visit | Attending: Internal Medicine | Admitting: Internal Medicine

## 2019-03-28 DIAGNOSIS — I110 Hypertensive heart disease with heart failure: Secondary | ICD-10-CM | POA: Diagnosis not present

## 2019-03-28 DIAGNOSIS — I509 Heart failure, unspecified: Secondary | ICD-10-CM | POA: Insufficient documentation

## 2019-03-28 DIAGNOSIS — Z87891 Personal history of nicotine dependence: Secondary | ICD-10-CM | POA: Diagnosis not present

## 2019-03-28 DIAGNOSIS — I350 Nonrheumatic aortic (valve) stenosis: Secondary | ICD-10-CM | POA: Diagnosis not present

## 2019-03-28 DIAGNOSIS — Z7982 Long term (current) use of aspirin: Secondary | ICD-10-CM | POA: Insufficient documentation

## 2019-03-28 DIAGNOSIS — Z79899 Other long term (current) drug therapy: Secondary | ICD-10-CM | POA: Diagnosis not present

## 2019-03-28 DIAGNOSIS — R918 Other nonspecific abnormal finding of lung field: Secondary | ICD-10-CM | POA: Insufficient documentation

## 2019-03-28 DIAGNOSIS — I2510 Atherosclerotic heart disease of native coronary artery without angina pectoris: Secondary | ICD-10-CM | POA: Diagnosis not present

## 2019-03-28 DIAGNOSIS — K5730 Diverticulosis of large intestine without perforation or abscess without bleeding: Secondary | ICD-10-CM | POA: Insufficient documentation

## 2019-03-28 LAB — GLUCOSE, CAPILLARY: Glucose-Capillary: 88 mg/dL (ref 70–99)

## 2019-03-28 IMAGING — CT NUCLEAR MEDICINE PET IMAGE INITIAL (PI) SKULL BASE TO THIGH
1 of 2 series · 1 of 16 positions shown · non-contrast
Comparison: CT chest 03/10/2019

CLINICAL DATA: Initial treatment strategy for lung nodule.

EXAM:
NUCLEAR MEDICINE PET SKULL BASE TO THIGH
TECHNIQUE: 5.3 mCi F-18 FDG was injected intravenously. Full-ring PET imaging
was performed from the skull base to thigh after the radiotracer. CT
data was obtained and used for attenuation correction and anatomic
localization.
Fasting blood glucose: 88 mg/dl

[Series 603: range-ct sk_thigh 5.0 (id)<alpha range> · 1 of 70 slices shown]
[im 35/70]
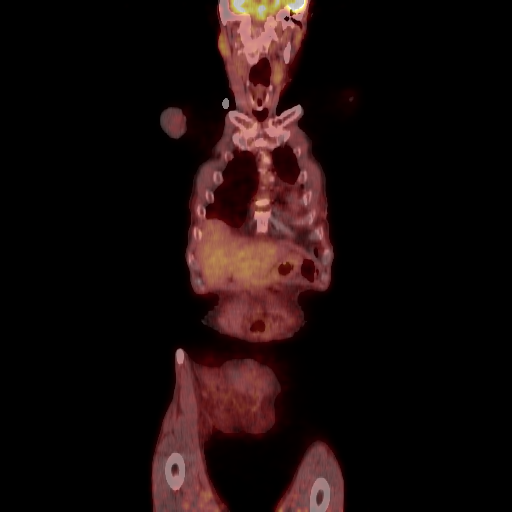

[1 of 16 positions shown; findings below may reference images not displayed]

FINDINGS: Mediastinal blood pool activity: SUV max

Liver activity: SUV max NA

NECK: No hypermetabolic lymph nodes in the neck.

Incidental CT findings: none

CHEST: The bilateral upper lobe pulmonary nodules in question on
recent chest CT are identified on today's PET exam.

10 mm right upper lobe nodule [DATE]) demonstrates SUV max = [REDACTED] mm left upper lobe nodule ([DATE]) demonstrates SUV max = 0.65.

No hypermetabolic lymphadenopathy in the chest.

Incidental CT findings: Atelectasis noted posterior left base. Heart
is enlarged. Coronary artery calcification is evident.
Atherosclerotic calcification is noted in the wall of the thoracic
aorta. Scattered areas of architectural distortion in the upper
lungs bilaterally was better demonstrated on previous diagnostic
chest CT.

ABDOMEN/PELVIS: No abnormal hypermetabolic activity within the
liver, pancreas, adrenal glands, or spleen. No hypermetabolic lymph
nodes in the abdomen or pelvis.

Focal hypermetabolic FDG accumulation is identified in the anus.

Incidental CT findings: Probable tiny calcified gallstone on image
113/series 4. There is abdominal aortic atherosclerosis without
aneurysm. Diffuse diverticular disease noted in the colon. Trace
free fluid evident in the pelvis. 2.8 cm exophytic cyst noted
interpolar right kidney.

SKELETON: No focal hypermetabolic activity to suggest skeletal
metastasis.

Incidental CT findings: Bones are diffusely demineralized. Status
post lumbar fusion.
IMPRESSION: 1. No hypermetabolism in the 10 mm right upper lobe and left upper
lobe nodules. This is reassuring and given the other areas of subtle
nodularity/architectural distortion, features likely reflect post
infectious/postinflammatory scarring. While the lack of FDG
accumulation is reassuring, low-grade or well differentiated
neoplasm can be poorly FDG avid and continued surveillance likely
warranted.
2. Focal hypermetabolism in the anus. This can be physiologic but
correlation suggested to exclude anal neoplasm.
3.  Aortic Atherosclerois (YKLFN-170.0)
4. Colonic diverticulosis without diverticulitis.
5. Trace free fluid noted in the pelvis, indeterminate.

## 2019-03-28 MED ORDER — FLUDEOXYGLUCOSE F - 18 (FDG) INJECTION
5.3000 | Freq: Once | INTRAVENOUS | Status: AC | PRN
  Administered 2019-03-28: 5.3 via INTRAVENOUS

## 2019-03-31 ENCOUNTER — Other Ambulatory Visit (HOSPITAL_COMMUNITY)
Admission: RE | Admit: 2019-03-31 | Discharge: 2019-03-31 | Disposition: A | Discharge status: Home / Self Care | Payer: Medicare Other | Source: Ambulatory Visit | Attending: Cardiovascular Disease | Admitting: Cardiovascular Disease

## 2019-03-31 DIAGNOSIS — Z20828 Contact with and (suspected) exposure to other viral communicable diseases: Secondary | ICD-10-CM | POA: Insufficient documentation

## 2019-03-31 LAB — SARS CORONAVIRUS 2 (TAT 6-24 HRS): SARS Coronavirus 2: NEGATIVE

## 2019-04-01 ENCOUNTER — Telehealth: Payer: Self-pay | Admitting: *Deleted

## 2019-04-01 NOTE — Telephone Encounter (Addendum)
Pt contacted pre-catheterization scheduled at [facility] for: Wednesday April 02, 2019 1 PM/TEE 10:45 AM Verified arrival time and place: [facility] Entrance A at: 9:45 AM  Covid-19 test date: 03/31/19  Nothing to eat or drink after midnight the night before procedures. Contrast allergy: no  Hold: Lasix/KCl-day before and day of procedures-GFR 51 Pt states he has already taken today.  Except hold medications AM meds can be  taken pre-cath with sip of water including: ASA 81 mg   Confirm patient has responsible person to drive home post procedure and observe 24 hours after arriving home: yes  Due to Covid-19 pandemic, only one support person will be allowed with patient. Must be the same support person for that patient's entire stay, will be screened and required to wear a mask.   Patients are required to wear a mask when they enter the hospital.      COVID-19 Pre-Screening Questions:  . In the past 7 to 10 days have you had a cough,  shortness of breath, headache, congestion, fever (100 or greater) body aches, chills, sore throat, or sudden loss of taste or sense of smell? no . Have you been around anyone with known Covid 19? no . Have you been around anyone who is awaiting Covid 19 test results in the past 7 to 10 days? no . Have you been around anyone who has been exposed to Covid 19, or has mentioned symptoms of Covid 19 within the past 7 to 10 days? no  I reviewed procedure/mask/visitor, Covid-19 screening questions with patient, he verbalized understanding, thanked me for call.

## 2019-04-01 NOTE — Telephone Encounter (Signed)
New message   Patient states that he is returning your call. Please call the patient.

## 2019-04-02 ENCOUNTER — Ambulatory Visit (HOSPITAL_COMMUNITY)
Admission: RE | Admit: 2019-04-02 | Discharge: 2019-04-03 | Disposition: A | Discharge status: Home / Self Care | Payer: Medicare Other | Attending: Cardiology | Admitting: Cardiology

## 2019-04-02 ENCOUNTER — Encounter (HOSPITAL_COMMUNITY)
Admission: RE | Disposition: A | Discharge status: Home / Self Care | Payer: Medicare Other | Source: Home / Self Care | Attending: Cardiology

## 2019-04-02 ENCOUNTER — Encounter (HOSPITAL_COMMUNITY)
Admission: RE | Disposition: A | Discharge status: Home / Self Care | Payer: Self-pay | Source: Home / Self Care | Attending: Cardiology

## 2019-04-02 ENCOUNTER — Encounter (HOSPITAL_COMMUNITY): Payer: Self-pay | Admitting: Cardiology

## 2019-04-02 ENCOUNTER — Ambulatory Visit (HOSPITAL_COMMUNITY): Payer: Medicare Other | Admitting: Certified Registered"

## 2019-04-02 ENCOUNTER — Ambulatory Visit (HOSPITAL_BASED_OUTPATIENT_CLINIC_OR_DEPARTMENT_OTHER)
Admission: RE | Admit: 2019-04-02 | Discharge: 2019-04-02 | Disposition: A | Discharge status: Home / Self Care | Payer: Medicare Other | Source: Ambulatory Visit | Attending: Cardiovascular Disease | Admitting: Cardiovascular Disease

## 2019-04-02 ENCOUNTER — Other Ambulatory Visit (HOSPITAL_COMMUNITY): Payer: Self-pay | Admitting: Cardiovascular Disease

## 2019-04-02 ENCOUNTER — Other Ambulatory Visit: Payer: Self-pay

## 2019-04-02 DIAGNOSIS — M069 Rheumatoid arthritis, unspecified: Secondary | ICD-10-CM | POA: Diagnosis not present

## 2019-04-02 DIAGNOSIS — I352 Nonrheumatic aortic (valve) stenosis with insufficiency: Secondary | ICD-10-CM | POA: Insufficient documentation

## 2019-04-02 DIAGNOSIS — Z952 Presence of prosthetic heart valve: Secondary | ICD-10-CM | POA: Diagnosis not present

## 2019-04-02 DIAGNOSIS — Z09 Encounter for follow-up examination after completed treatment for conditions other than malignant neoplasm: Secondary | ICD-10-CM | POA: Diagnosis not present

## 2019-04-02 DIAGNOSIS — Z953 Presence of xenogenic heart valve: Secondary | ICD-10-CM

## 2019-04-02 DIAGNOSIS — I4891 Unspecified atrial fibrillation: Secondary | ICD-10-CM | POA: Diagnosis not present

## 2019-04-02 DIAGNOSIS — I700 Atherosclerosis of aorta: Secondary | ICD-10-CM | POA: Diagnosis not present

## 2019-04-02 DIAGNOSIS — I6523 Occlusion and stenosis of bilateral carotid arteries: Secondary | ICD-10-CM | POA: Diagnosis not present

## 2019-04-02 DIAGNOSIS — J984 Other disorders of lung: Secondary | ICD-10-CM | POA: Diagnosis not present

## 2019-04-02 DIAGNOSIS — I110 Hypertensive heart disease with heart failure: Secondary | ICD-10-CM | POA: Insufficient documentation

## 2019-04-02 DIAGNOSIS — D509 Iron deficiency anemia, unspecified: Secondary | ICD-10-CM | POA: Insufficient documentation

## 2019-04-02 DIAGNOSIS — Z7982 Long term (current) use of aspirin: Secondary | ICD-10-CM | POA: Insufficient documentation

## 2019-04-02 DIAGNOSIS — Z87891 Personal history of nicotine dependence: Secondary | ICD-10-CM | POA: Insufficient documentation

## 2019-04-02 DIAGNOSIS — Z79899 Other long term (current) drug therapy: Secondary | ICD-10-CM | POA: Diagnosis not present

## 2019-04-02 DIAGNOSIS — I350 Nonrheumatic aortic (valve) stenosis: Secondary | ICD-10-CM

## 2019-04-02 DIAGNOSIS — K219 Gastro-esophageal reflux disease without esophagitis: Secondary | ICD-10-CM | POA: Diagnosis not present

## 2019-04-02 DIAGNOSIS — I2510 Atherosclerotic heart disease of native coronary artery without angina pectoris: Secondary | ICD-10-CM | POA: Diagnosis not present

## 2019-04-02 DIAGNOSIS — Z8249 Family history of ischemic heart disease and other diseases of the circulatory system: Secondary | ICD-10-CM | POA: Insufficient documentation

## 2019-04-02 DIAGNOSIS — D589 Hereditary hemolytic anemia, unspecified: Secondary | ICD-10-CM | POA: Diagnosis present

## 2019-04-02 DIAGNOSIS — I351 Nonrheumatic aortic (valve) insufficiency: Secondary | ICD-10-CM | POA: Diagnosis not present

## 2019-04-02 DIAGNOSIS — I5032 Chronic diastolic (congestive) heart failure: Secondary | ICD-10-CM | POA: Diagnosis not present

## 2019-04-02 HISTORY — PX: RIGHT/LEFT HEART CATH AND CORONARY ANGIOGRAPHY: CATH118266

## 2019-04-02 HISTORY — PX: TEE WITHOUT CARDIOVERSION: SHX5443

## 2019-04-02 LAB — PROTIME-INR
INR: 1.4 — ABNORMAL HIGH (ref 0.8–1.2)
Prothrombin Time: 17.3 seconds — ABNORMAL HIGH (ref 11.4–15.2)

## 2019-04-02 LAB — POCT I-STAT 7, (LYTES, BLD GAS, ICA,H+H)
Acid-Base Excess: 1 mmol/L (ref 0.0–2.0)
Bicarbonate: 25.1 mmol/L (ref 20.0–28.0)
Calcium, Ion: 0.93 mmol/L — ABNORMAL LOW (ref 1.15–1.40)
HCT: 24 % — ABNORMAL LOW (ref 39.0–52.0)
Hemoglobin: 8.2 g/dL — ABNORMAL LOW (ref 13.0–17.0)
O2 Saturation: 96 %
Potassium: 3.5 mmol/L (ref 3.5–5.1)
Sodium: 143 mmol/L (ref 135–145)
TCO2: 26 mmol/L (ref 22–32)
pCO2 arterial: 38.6 mmHg (ref 32.0–48.0)
pH, Arterial: 7.422 (ref 7.350–7.450)
pO2, Arterial: 77 mmHg — ABNORMAL LOW (ref 83.0–108.0)

## 2019-04-02 LAB — POCT I-STAT EG7
Acid-Base Excess: 2 mmol/L (ref 0.0–2.0)
Acid-Base Excess: 1 mmol/L (ref 0.0–2.0)
Bicarbonate: 27 mmol/L (ref 20.0–28.0)
Bicarbonate: 26 mmol/L (ref 20.0–28.0)
Calcium, Ion: 0.93 mmol/L — ABNORMAL LOW (ref 1.15–1.40)
Calcium, Ion: 0.9 mmol/L — ABNORMAL LOW (ref 1.15–1.40)
HCT: 24 % — ABNORMAL LOW (ref 39.0–52.0)
HCT: 23 % — ABNORMAL LOW (ref 39.0–52.0)
Hemoglobin: 8.2 g/dL — ABNORMAL LOW (ref 13.0–17.0)
Hemoglobin: 7.8 g/dL — ABNORMAL LOW (ref 13.0–17.0)
O2 Saturation: 54 %
O2 Saturation: 55 %
Potassium: 3.5 mmol/L (ref 3.5–5.1)
Potassium: 3.4 mmol/L — ABNORMAL LOW (ref 3.5–5.1)
Sodium: 143 mmol/L (ref 135–145)
Sodium: 143 mmol/L (ref 135–145)
TCO2: 28 mmol/L (ref 22–32)
TCO2: 27 mmol/L (ref 22–32)
pCO2, Ven: 42.6 mmHg — ABNORMAL LOW (ref 44.0–60.0)
pCO2, Ven: 42.9 mmHg — ABNORMAL LOW (ref 44.0–60.0)
pH, Ven: 7.409 (ref 7.250–7.430)
pH, Ven: 7.39 (ref 7.250–7.430)
pO2, Ven: 28 mmHg — CL (ref 32.0–45.0)
pO2, Ven: 29 mmHg — CL (ref 32.0–45.0)

## 2019-04-02 LAB — CBC
HCT: 22 % — ABNORMAL LOW (ref 39.0–52.0)
Hemoglobin: 7.1 g/dL — ABNORMAL LOW (ref 13.0–17.0)
MCH: 22.7 pg — ABNORMAL LOW (ref 26.0–34.0)
MCHC: 32.3 g/dL (ref 30.0–36.0)
MCV: 70.3 fL — ABNORMAL LOW (ref 80.0–100.0)
Platelets: 81 10*3/uL — ABNORMAL LOW (ref 150–400)
RBC: 3.13 MIL/uL — ABNORMAL LOW (ref 4.22–5.81)
RDW: 19.6 % — ABNORMAL HIGH (ref 11.5–15.5)
WBC: 4.6 10*3/uL (ref 4.0–10.5)
nRBC: 0.4 % — ABNORMAL HIGH (ref 0.0–0.2)

## 2019-04-02 LAB — BASIC METABOLIC PANEL
Anion gap: 9 (ref 5–15)
BUN: 23 mg/dL (ref 8–23)
CO2: 27 mmol/L (ref 22–32)
Calcium: 8.4 mg/dL — ABNORMAL LOW (ref 8.9–10.3)
Chloride: 107 mmol/L (ref 98–111)
Creatinine, Ser: 1.14 mg/dL (ref 0.61–1.24)
GFR calc Af Amer: >60 mL/min (ref >60)
GFR calc non Af Amer: 60 mL/min — ABNORMAL LOW (ref >60)
Glucose, Bld: 83 mg/dL (ref 70–99)
Potassium: 5.4 mmol/L — ABNORMAL HIGH (ref 3.5–5.1)
Sodium: 143 mmol/L (ref 135–145)

## 2019-04-02 LAB — POCT ACTIVATED CLOTTING TIME: Activated Clotting Time: 125 seconds

## 2019-04-02 SURGERY — RIGHT/LEFT HEART CATH AND CORONARY ANGIOGRAPHY
Anesthesia: LOCAL

## 2019-04-02 SURGERY — ECHOCARDIOGRAM, TRANSESOPHAGEAL
Anesthesia: Monitor Anesthesia Care

## 2019-04-02 MED ORDER — VERAPAMIL HCL 2.5 MG/ML IV SOLN
INTRAVENOUS | Status: DC | PRN
  Administered 2019-04-02: 12:00:00 10 mL via INTRA_ARTERIAL

## 2019-04-02 MED ORDER — HEPARIN SODIUM (PORCINE) 1000 UNIT/ML IJ SOLN
INTRAMUSCULAR | Status: AC
  Filled 2019-04-02: qty 1

## 2019-04-02 MED ORDER — VERAPAMIL HCL 2.5 MG/ML IV SOLN
INTRAVENOUS | Status: AC
  Filled 2019-04-02: qty 2

## 2019-04-02 MED ORDER — SODIUM CHLORIDE 0.9% FLUSH: 3.0000 mL | INTRAVENOUS | Status: DC | PRN

## 2019-04-02 MED ORDER — HEPARIN (PORCINE) IN NACL 1000-0.9 UT/500ML-% IV SOLN
INTRAVENOUS | Status: DC | PRN
  Administered 2019-04-02 (×2): 500 mL

## 2019-04-02 MED ORDER — PREDNISONE 5 MG PO TABS
5.0000 mg | ORAL_TABLET | Freq: Every day | ORAL | Status: DC
  Administered 2019-04-03: 5 mg via ORAL
  Filled 2019-04-02 (×2): qty 1

## 2019-04-02 MED ORDER — LIDOCAINE HCL (PF) 1 % IJ SOLN
INTRAMUSCULAR | Status: AC
  Filled 2019-04-02: qty 30

## 2019-04-02 MED ORDER — ASPIRIN 81 MG PO CHEW: 81.0000 mg | CHEWABLE_TABLET | ORAL | Status: DC

## 2019-04-02 MED ORDER — SODIUM CHLORIDE 0.9 % IV SOLN: 250.0000 mL | INTRAVENOUS | Status: DC | PRN

## 2019-04-02 MED ORDER — VITAMIN B-12 1000 MCG PO TABS
1000.0000 ug | ORAL_TABLET | Freq: Every day | ORAL | Status: DC
  Administered 2019-04-03: 09:00:00 1000 ug via ORAL
  Filled 2019-04-02 (×2): qty 1

## 2019-04-02 MED ORDER — FOLIC ACID 1 MG PO TABS
1.0000 mg | ORAL_TABLET | Freq: Every day | ORAL | Status: DC
  Administered 2019-04-02 – 2019-04-03 (×2): 1 mg via ORAL
  Filled 2019-04-02 (×2): qty 1

## 2019-04-02 MED ORDER — HEPARIN SODIUM (PORCINE) 1000 UNIT/ML IJ SOLN
INTRAMUSCULAR | Status: DC | PRN
  Administered 2019-04-02: 2000 [IU] via INTRAVENOUS

## 2019-04-02 MED ORDER — FUROSEMIDE 20 MG PO TABS
40.0000 mg | ORAL_TABLET | Freq: Every day | ORAL | Status: DC
  Administered 2019-04-03: 09:00:00 40 mg via ORAL
  Filled 2019-04-02 (×2): qty 2

## 2019-04-02 MED ORDER — SODIUM CHLORIDE 0.9 % WEIGHT BASED INFUSION: 1.0000 mL/kg/h | INTRAVENOUS | Status: DC

## 2019-04-02 MED ORDER — ASPIRIN EC 81 MG PO TBEC
81.0000 mg | DELAYED_RELEASE_TABLET | Freq: Every day | ORAL | Status: DC
  Administered 2019-04-03: 81 mg via ORAL
  Filled 2019-04-02 (×2): qty 1

## 2019-04-02 MED ORDER — SODIUM CHLORIDE 0.9% FLUSH
3.0000 mL | Freq: Two times a day (BID) | INTRAVENOUS | Status: DC
  Administered 2019-04-02: 16:00:00 3 mL via INTRAVENOUS

## 2019-04-02 MED ORDER — GLYCOPYRROLATE 0.2 MG/ML IJ SOLN
INTRAMUSCULAR | Status: DC | PRN
  Administered 2019-04-02: 0.2 mg via INTRAVENOUS

## 2019-04-02 MED ORDER — POTASSIUM CHLORIDE CRYS ER 20 MEQ PO TBCR
20.0000 meq | EXTENDED_RELEASE_TABLET | Freq: Every day | ORAL | Status: DC
  Administered 2019-04-03: 09:00:00 20 meq via ORAL
  Filled 2019-04-02 (×2): qty 1

## 2019-04-02 MED ORDER — PROPOFOL 10 MG/ML IV BOLUS
INTRAVENOUS | Status: DC | PRN
  Administered 2019-04-02 (×2): 20 mg via INTRAVENOUS
  Administered 2019-04-02: 10 mg via INTRAVENOUS

## 2019-04-02 MED ORDER — METOPROLOL SUCCINATE ER 25 MG PO TB24
25.0000 mg | ORAL_TABLET | Freq: Every day | ORAL | Status: DC
  Administered 2019-04-02: 25 mg via ORAL
  Filled 2019-04-02 (×2): qty 1

## 2019-04-02 MED ORDER — PANTOPRAZOLE SODIUM 40 MG PO TBEC
40.0000 mg | DELAYED_RELEASE_TABLET | Freq: Every day | ORAL | Status: DC
  Administered 2019-04-03: 09:00:00 40 mg via ORAL
  Filled 2019-04-02 (×2): qty 1

## 2019-04-02 MED ORDER — OXYCODONE HCL 5 MG PO TABS
5.0000 mg | ORAL_TABLET | ORAL | Status: DC | PRN
  Administered 2019-04-02: 5 mg via ORAL
  Filled 2019-04-02: qty 1

## 2019-04-02 MED ORDER — SODIUM CHLORIDE 0.9% FLUSH: 3.0000 mL | Freq: Two times a day (BID) | INTRAVENOUS | Status: DC

## 2019-04-02 MED ORDER — SODIUM CHLORIDE 0.9 % WEIGHT BASED INFUSION: 3.0000 mL/kg/h | INTRAVENOUS | Status: DC

## 2019-04-02 MED ORDER — SODIUM CHLORIDE 0.9 % IV SOLN
INTRAVENOUS | Status: DC | PRN
  Administered 2019-04-02: 10 ug/min via INTRAVENOUS

## 2019-04-02 MED ORDER — VITAMIN D 25 MCG (1000 UNIT) PO TABS
1000.0000 [IU] | ORAL_TABLET | Freq: Every day | ORAL | Status: DC
  Administered 2019-04-02 – 2019-04-03 (×2): 1000 [IU] via ORAL
  Filled 2019-04-02 (×2): qty 1

## 2019-04-02 MED ORDER — LIDOCAINE HCL (PF) 1 % IJ SOLN
INTRAMUSCULAR | Status: DC | PRN
  Administered 2019-04-02 (×2): 2 mL

## 2019-04-02 MED ORDER — TAMSULOSIN HCL 0.4 MG PO CAPS
0.8000 mg | ORAL_CAPSULE | Freq: Every day | ORAL | Status: DC
  Administered 2019-04-02 – 2019-04-03 (×2): 0.8 mg via ORAL
  Filled 2019-04-02 (×2): qty 2

## 2019-04-02 MED ORDER — HEPARIN (PORCINE) IN NACL 1000-0.9 UT/500ML-% IV SOLN
INTRAVENOUS | Status: AC
  Filled 2019-04-02: qty 1000

## 2019-04-02 MED ORDER — ONDANSETRON HCL 4 MG/2ML IJ SOLN: 4.0000 mg | Freq: Four times a day (QID) | INTRAMUSCULAR | Status: DC | PRN

## 2019-04-02 MED ORDER — ADULT MULTIVITAMIN W/MINERALS CH
1.0000 | ORAL_TABLET | Freq: Every day | ORAL | Status: DC
  Administered 2019-04-02 – 2019-04-03 (×2): 1 via ORAL
  Filled 2019-04-02 (×2): qty 1

## 2019-04-02 MED ORDER — LIDOCAINE 2% (20 MG/ML) 5 ML SYRINGE
INTRAMUSCULAR | Status: DC | PRN
  Administered 2019-04-02: 40 mg via INTRAVENOUS

## 2019-04-02 MED ORDER — SODIUM CHLORIDE 0.9 % IV SOLN
INTRAVENOUS | Status: DC
  Administered 2019-04-02: 10:00:00 via INTRAVENOUS

## 2019-04-02 MED ORDER — IOHEXOL 350 MG/ML SOLN
INTRAVENOUS | Status: DC | PRN
  Administered 2019-04-02: 13:00:00 80 mL via INTRACARDIAC

## 2019-04-02 MED ORDER — ACETAMINOPHEN 500 MG PO TABS: 1000.0000 mg | ORAL_TABLET | Freq: Four times a day (QID) | ORAL | Status: DC | PRN

## 2019-04-02 MED ORDER — SODIUM CHLORIDE 0.9 % IV SOLN
INTRAVENOUS | Status: AC
  Administered 2019-04-02: 14:00:00 via INTRAVENOUS

## 2019-04-02 MED ORDER — PROPOFOL 500 MG/50ML IV EMUL
INTRAVENOUS | Status: DC | PRN
  Administered 2019-04-02: 50 ug/kg/min via INTRAVENOUS

## 2019-04-02 MED ORDER — LEFLUNOMIDE 20 MG PO TABS
20.0000 mg | ORAL_TABLET | Freq: Every day | ORAL | Status: DC
  Administered 2019-04-02 – 2019-04-03 (×2): 20 mg via ORAL
  Filled 2019-04-02 (×2): qty 1

## 2019-04-02 MED ORDER — TURMERIC 500 MG PO CAPS: 500.0000 mg | ORAL_CAPSULE | Freq: Every day | ORAL | Status: DC

## 2019-04-02 MED ORDER — METHOTREXATE 2.5 MG PO TABS: 15.0000 mg | ORAL_TABLET | ORAL | Status: DC

## 2019-04-02 MED ORDER — CARBOXYMETHYLCELLULOSE SODIUM 0.25 % OP SOLN: 1.0000 [drp] | Freq: Three times a day (TID) | OPHTHALMIC | Status: DC | PRN

## 2019-04-02 SURGICAL SUPPLY — 18 items
CATH BALLN WEDGE 5F 110CM (CATHETERS) ×2 IMPLANT
CATH INFINITI 5FR ANG PIGTAIL (CATHETERS) ×2 IMPLANT
CATH INFINITI JR4 5F (CATHETERS) ×2 IMPLANT
CATH OPTITORQUE TIG 4.0 5F (CATHETERS) ×2 IMPLANT
COVER DOME SNAP 22 D (MISCELLANEOUS) ×2 IMPLANT
DEVICE RAD COMP TR BAND LRG (VASCULAR PRODUCTS) ×2 IMPLANT
GUIDEWIRE .025 260CM (WIRE) ×2 IMPLANT
GUIDEWIRE INQWIRE 1.5J.035X260 (WIRE) ×1 IMPLANT
INQWIRE 1.5J .035X260CM (WIRE) ×2
KIT HEART LEFT (KITS) ×2 IMPLANT
PACK CARDIAC CATHETERIZATION (CUSTOM PROCEDURE TRAY) ×2 IMPLANT
SHEATH GLIDE SLENDER 4/5FR (SHEATH) ×2 IMPLANT
SHEATH PROBE COVER 6X72 (BAG) ×2 IMPLANT
SHEATH RAIN RADIAL 21G 6FR (SHEATH) ×2 IMPLANT
SYR MEDRAD MARK 7 150ML (SYRINGE) ×2 IMPLANT
TRANSDUCER W/STOPCOCK (MISCELLANEOUS) ×2 IMPLANT
TUBING CIL FLEX 10 FLL-RA (TUBING) ×2 IMPLANT
WIRE MICROINTRODUCER 60CM (WIRE) ×2 IMPLANT

## 2019-04-02 NOTE — Transfer of Care (Signed)
Immediate Anesthesia Transfer of Care Note  Patient: Joshua Hudson  Procedure(s) Performed: TRANSESOPHAGEAL ECHOCARDIOGRAM (TEE) (N/A )  Patient Location: PACU  Anesthesia Type:MAC  Level of Consciousness: awake, alert , oriented and patient cooperative  Airway & Oxygen Therapy: Patient Spontanous Breathing and Patient connected to nasal cannula oxygen  Post-op Assessment: Report given to RN and Post -op Vital signs reviewed and stable  Post vital signs: Reviewed and stable  Last Vitals:  Vitals Value Taken Time  BP 108/34 04/02/19 1116  Temp    Pulse 51 04/02/19 1118  Resp 15 04/02/19 1118  SpO2 100 % 04/02/19 1118  Vitals shown include unvalidated device data.  Last Pain:  Vitals:   04/02/19 0951  TempSrc: Oral  PainSc: 0-No pain         Complications: No apparent anesthesia complications

## 2019-04-02 NOTE — Interval H&P Note (Signed)
History and Physical Interval Note:  04/02/2019 9:35 AM  Joshua Hudson  has presented today for surgery, with the diagnosis of INSUFFICIENCEY VALVE.  The various methods of treatment have been discussed with the patient and family. After consideration of risks, benefits and other options for treatment, the patient has consented to  Procedure(s): TRANSESOPHAGEAL ECHOCARDIOGRAM (TEE) (N/A) as a surgical intervention.  The patient's history has been reviewed, patient examined, no change in status, stable for surgery.  I have reviewed the patient's chart and labs.  Questions were answered to the patient's satisfaction.     Jenkins Rouge

## 2019-04-02 NOTE — Interval H&P Note (Signed)
History and Physical Interval Note:  04/02/2019 11:54 AM  Joshua Hudson  has presented today for surgery, with the diagnosis of shortness of breath with severe aortic regurgitation (status post bioprosthetic aortic valve)..  T. He has just had a TEE confirming severe/torrential aortic insufficiency.  he various methods of treatment have been discussed with the patient and family. After consideration of risks, benefits and other options for treatment, the patient has consented to  Procedure(s): RIGHT/LEFT HEART CATH AND CORONARY ANGIOGRAPHY (N/A) as a surgical intervention.  The patient's history has been reviewed, patient examined, no change in status, stable for surgery.  I have reviewed the patient's chart and labs.  Questions were answered to the patient's satisfaction.     Glenetta Hew

## 2019-04-02 NOTE — Progress Notes (Signed)
  Echocardiogram Echocardiogram Transesophageal has been performed.  Joshua Hudson M 04/02/2019, 11:26 AM

## 2019-04-02 NOTE — CV Procedure (Signed)
TEE: Anesthesia:  Propofol  EF normal 60% Degenerated stented bioprosthetic AVR Difficult to tell if severe AR Is entirely central but sewing ring appears intact Leaflets are calcified On 3D imaging AR appears central Trivial MR No LAA thrombus Normal Rv Normal aortic root  Patient will likely need aortic root angiogram and cardiac CTA to further See if he is a TAVR Candidate and make sure there is not significant Peri valvular AR  Jenkins Rouge

## 2019-04-02 NOTE — Anesthesia Preprocedure Evaluation (Signed)
Anesthesia Evaluation    Airway Mallampati: II  TM Distance: >3 FB Neck ROM: Full    Dental  (+) Dental Advisory Given   Pulmonary former smoker,    breath sounds clear to auscultation       Cardiovascular hypertension, Pt. on medications and Pt. on home beta blockers + CAD (non-obstructive)  + dysrhythmias Atrial Fibrillation + Valvular Problems/Murmurs AI and MR  Rhythm:Irregular Rate:Normal  03/18/2019 ECHO: LV normal systolic function, EF 18-98%. The cavity size was normal. Left ventricular diastolic Doppler parameters are consistent with restrictive filling. Elevated left atrial and left ventricular  end-diastolic pressures  RV mildly reduced systolic function. Right ventricular systolic pressure is severely elevated with an estimated pressure of 64.2 mmHg. Mod MR, Mod TR, S/P AVR with a 25 mm Edwards bioprosthetic valve. Aortic valve regurgitation is moderate by color flow Doppler. No stenosis of the aortic valve.   Neuro/Psych    GI/Hepatic GERD (esophageal stricture)  Medicated,  Endo/Other    Renal/GU Renal InsufficiencyRenal disease (creat 1.30)     Musculoskeletal  (+) Arthritis , Rheumatoid disorders and steroids,    Abdominal   Peds  Hematology  (+) Blood dyscrasia (Hb 7.8), anemia ,   Anesthesia Other Findings   Reproductive/Obstetrics                             Anesthesia Physical Anesthesia Plan  ASA: IV  Anesthesia Plan: MAC   Post-op Pain Management:    Induction:   PONV Risk Score and Plan: 1 and Treatment may vary due to age or medical condition  Airway Management Planned: Natural Airway and Nasal Cannula  Additional Equipment:   Intra-op Plan:   Post-operative Plan:   Informed Consent: I have reviewed the patients History and Physical, chart, labs and discussed the procedure including the risks, benefits and alternatives for the proposed anesthesia with the  patient or authorized representative who has indicated his/her understanding and acceptance.     Dental advisory given  Plan Discussed with: CRNA and Surgeon  Anesthesia Plan Comments:         Anesthesia Quick Evaluation

## 2019-04-02 NOTE — Anesthesia Postprocedure Evaluation (Signed)
Anesthesia Post Note  Patient: Joshua Hudson  Procedure(s) Performed: TRANSESOPHAGEAL ECHOCARDIOGRAM (TEE) (N/A )     Patient location during evaluation: Endoscopy Anesthesia Type: MAC Level of consciousness: awake and alert, oriented and patient cooperative Pain management: pain level controlled Vital Signs Assessment: post-procedure vital signs reviewed and stable Respiratory status: spontaneous breathing, nonlabored ventilation and respiratory function stable Cardiovascular status: blood pressure returned to baseline and stable Postop Assessment: no apparent nausea or vomiting    Last Vitals:  Vitals:   04/02/19 1135 04/02/19 1200  BP: (!) 111/35   Pulse: (!) 54   Resp: 13   Temp:    SpO2: 100% 100%    Last Pain:  Vitals:   04/02/19 1211  TempSrc:   PainSc: 0-No pain                 Zahniya Zellars,E. Analese Sovine

## 2019-04-02 NOTE — Progress Notes (Signed)
TR BAND REMOVAL  LOCATION:    right radial  DEFLATED PER PROTOCOL:    Yes.    TIME BAND OFF / DRESSING APPLIED:    1630   SITE UPON ARRIVAL:    Level 0  SITE AFTER BAND REMOVAL:    Level 0  CIRCULATION SENSATION AND MOVEMENT:    Within Normal Limits   Yes.    COMMENTS:    

## 2019-04-03 ENCOUNTER — Ambulatory Visit (HOSPITAL_BASED_OUTPATIENT_CLINIC_OR_DEPARTMENT_OTHER): Payer: Medicare Other

## 2019-04-03 ENCOUNTER — Other Ambulatory Visit: Payer: Self-pay

## 2019-04-03 DIAGNOSIS — I351 Nonrheumatic aortic (valve) insufficiency: Secondary | ICD-10-CM | POA: Diagnosis not present

## 2019-04-03 DIAGNOSIS — Z01810 Encounter for preprocedural cardiovascular examination: Secondary | ICD-10-CM

## 2019-04-03 DIAGNOSIS — I2510 Atherosclerotic heart disease of native coronary artery without angina pectoris: Secondary | ICD-10-CM | POA: Diagnosis not present

## 2019-04-03 DIAGNOSIS — I350 Nonrheumatic aortic (valve) stenosis: Secondary | ICD-10-CM

## 2019-04-03 DIAGNOSIS — R0602 Shortness of breath: Secondary | ICD-10-CM

## 2019-04-03 LAB — CBC
HCT: 23.1 % — ABNORMAL LOW (ref 39.0–52.0)
Hemoglobin: 7.3 g/dL — ABNORMAL LOW (ref 13.0–17.0)
MCH: 22.6 pg — ABNORMAL LOW (ref 26.0–34.0)
MCHC: 31.6 g/dL (ref 30.0–36.0)
MCV: 71.5 fL — ABNORMAL LOW (ref 80.0–100.0)
Platelets: 63 10*3/uL — ABNORMAL LOW (ref 150–400)
RBC: 3.23 MIL/uL — ABNORMAL LOW (ref 4.22–5.81)
RDW: 18.6 % — ABNORMAL HIGH (ref 11.5–15.5)
WBC: 5.3 10*3/uL (ref 4.0–10.5)
nRBC: 0 % (ref 0.0–0.2)

## 2019-04-03 LAB — BASIC METABOLIC PANEL
Anion gap: 8 (ref 5–15)
BUN: 22 mg/dL (ref 8–23)
CO2: 26 mmol/L (ref 22–32)
Calcium: 8.7 mg/dL — ABNORMAL LOW (ref 8.9–10.3)
Chloride: 107 mmol/L (ref 98–111)
Creatinine, Ser: 1.13 mg/dL (ref 0.61–1.24)
GFR calc Af Amer: >60 mL/min (ref >60)
GFR calc non Af Amer: >60 mL/min (ref >60)
Glucose, Bld: 82 mg/dL (ref 70–99)
Potassium: 3.8 mmol/L (ref 3.5–5.1)
Sodium: 141 mmol/L (ref 135–145)

## 2019-04-03 MED ORDER — FUROSEMIDE 10 MG/ML IJ SOLN
40.0000 mg | Freq: Every day | INTRAMUSCULAR | Status: DC
  Filled 2019-04-03: qty 4

## 2019-04-03 NOTE — Progress Notes (Signed)
Carotid duplex has been completed.   Preliminary results in CV Proc.   Abram Sander 04/03/2019 11:20 AM

## 2019-04-03 NOTE — Discharge Summary (Addendum)
Discharge Summary    Patient ID: Joshua Hudson MRN: [id number]; DOB: [birth year]  Admit date: 04/02/2019 Discharge date: 04/03/2019  Primary Care Provider: Lavone Orn, MD  Primary Cardiologist: [name], MD   Discharge Diagnoses    Principal Problem:   Severe aortic insufficiency Active Problems:   S/P AVR (aortic valve replacement)   Microcytic anemia   Coronary artery disease, non-occlusive   Severe aortic regurgitation  Allergies No Known Allergies  Diagnostic Studies/Procedures    Right and left cardiac catheterization 04/02/2019:  Angiographically minimal CAD  The left ventricular systolic function is normal -as assessed by aortic root angiogram  There is severe (4+) aortic regurgitation.  LV end diastolic pressure is moderately elevated.  SUMMARY  Angiographically normal coronary arteries with a right dominant system.  Severe aortic insufficiency seen on aortic root angiogram  Mild to moderate elevated LVEDP and PCWP  RECOMMENDATIONS  We will place the patient in extended recovery/observation status to allow for monitoring of hemoglobin post procedure as well as to obtain consultations from the cardiac structural team and potentially hematology to assess his ongoing anemia.  Anticipate that he should be able to home tomorrow, but would consider checking Cardiac CTA for pre-TAVR prior to discharge.  Otherwise continue home meds.  TEE 04/02/2019: 1. The left ventricle has normal systolic function, with an ejection fraction of 60-65%. The cavity size was mildly dilated. Left ventricular diastolic function could not be evaluated. 2. The right ventricle has normal systolc function. The cavity was normal. There is no increase in right ventricular wall thickness. 3. No evidence of a thrombus present in the left atrial appendage. 4. Aortic valve regurgitation is severe by color flow Doppler. Mild stenosis of the aortic valve. 5. No vegetation on the  aortic valve. 6. 25 mm Edwards stented bioprosthetic valve. Leaflets thickened and calcified Sever AR tha appears central and not peri valvular. Suggest aortic root injection at cath and TAVR CTA to further assess possible valve in valve Rx. 7. Pulmonic valve regurgitation is mild by color flow Doppler. 8. The aortic root is normal in size and structure. 9. Extensive use of 3D imaging done to assess AVR.  History of Present Illness     Joshua Hudson a 84 y.o.malewith history of severe AS s/p AVR 2009 with post op atrial fibrillation, chronic anemia  and no recurrence and non-obstructive CAD per cath in 2009 (20-30% OM2).  Last echo 09/11/18 normal LVEF grade 2 DD, mild MR, Mod TR, S/P AVT gradients in normal range.  Joshua Hudson was last seen by his primary cardiologist, Dr. Radford Pax on 03/24/2019 to discuss recent echocardiogram findings which revealed at least moderate AR of his bioprosthesis which was concerning given more recent symptoms of dyspnea on exertion and bilateral LE edema. Plan was made to admit for [facility] and TEE to further evaluate aortic valve.   Hospital Course   On 04/02/2019 pt underwent R/LHC that showed normal coronary arteries and mildly elevated filling pressures. Follow up TEE 04/02/2019 with severe central AI due to valve leaflet failure. He remained stable during his hospital course. Initial plan was to have pre-TAVR CT scans complete while inpatient however due to CT volume, scans have been schedule for the OP setting. Carotid dopplers were able to be competed today prior to discharge. He has an appointment with Dr. Roxy Manns 04/14/2019. Joshua Hudson, with TAVR team will contact patient for all pre-procedure tests and appointments.   He continued to have mild fluid volume overload on exam with  BLE. Plan was to diurese with IV lasix today and discharge tomorrow. He received one dose prior to discharge and resume home dose PO lasix.   Other hospital problems  include:  Aortic Insufficiency with remote AVR for severe AS in 2009: -Right and left cardiac cath performed 04/02/2019 with angiographically normal coronary arteries with a right dominant system. Severe aortic insufficiency seen on aortic root angiogram with mild to moderate elevated LVEDP and PCWP -Follow-up TEE with LVEF of 60-65% and severe aortic valve regurgitation, 25 mm Edwards stented bioprosthetic valve in place. Aortic valve leaflets thickened and calcified with severe AR that appears central and not peri valvular. -Recommendations were for TAVR CTA to further assess possible valve in valve Rx>>TVAR coordinator to set up scans   ASCAD: -Nonobstructive disease per cardiac cath in 2009 with a 20 to 30% OM2 -In 2015 nonobstructive disease by coronary CTA in all 3 vessels -Seen by Dr. [name] with worsening dyspnea therefore plan was for [facility] for further evaluation as above -Denies anginal symptoms  -Continue ASA, beta-blocker  Recent worseningLE edema: -Denies SOB  -Creatinine, 1.13 which appears to be at baseline  -Received one dose of IV Lasix and resumed home PO dose   Chronic diastolic CHF: -Echo as above -Received one dose of IV Lasix and resumed home PO dose   Anemia: -Hemoglobin, 7.3 today with a baseline of 7.0-10.0 -No acute s/s/ of bleeding   Consultants: None  The patient was seen and examined by Dr. Angelena Form who feels that he is stable and ready for discharge, 04/03/2019.   _____________  Discharge Vitals Blood pressure (!) 102/51, pulse 64, temperature 97.7 F (36.5 C), temperature source Oral, resp. rate 20, weight 46.9 kg, SpO2 100 %.  Filed Weights   04/03/19 0620  Weight: 46.9 kg   Labs & Radiologic Studies    CBC Recent Labs    04/02/19 1027  04/02/19 1238 04/03/19 0355  WBC 4.6  --   --  5.3  HGB 7.1*   < > 7.8* 7.3*  HCT 22.0*   < > 23.0* 23.1*  MCV 70.3*  --   --  71.5*  PLT 81*  --   --  63*   < > = values in this interval  not displayed.   Basic Metabolic Panel Recent Labs    04/02/19 1027  04/02/19 1238 04/03/19 0355  NA 143   < > 143 141  K 5.4*   < > 3.4* 3.8  CL 107  --   --  107  CO2 27  --   --  26  GLUCOSE 83  --   --  82  BUN 23  --   --  22  CREATININE 1.14  --   --  1.13  CALCIUM 8.4*  --   --  8.7*   < > = values in this interval not displayed.   _____________  Ct Chest Wo Contrast  Result Date: 03/10/2019 CLINICAL DATA:  Followup pulmonary nodules. EXAM: CT CHEST WITHOUT CONTRAST TECHNIQUE: Multidetector CT imaging of the chest was performed following the standard protocol without IV contrast. COMPARISON:  CT of the shoulder 02/17/2019 and CT cardiac from 2015. FINDINGS: Cardiovascular: Mild stable cardiac enlargement. Stable surgical changes from coronary artery bypass surgery. Extensive three-vessel coronary artery calcifications. Prosthetic aortic valve. Mild tortuosity, ectasia and calcification of the thoracic aorta. Mediastinum/Nodes: Mediastinum no mediastinal or hilar mass or adenopathy. Small scattered lymph nodes are noted. Slightly patulous esophagus appears relatively stable. No  mass. Lungs/Pleura: Small partially cavitary lesion in the right lung apex on image number 32 measures 10 mm. No change since recent shoulder CT scan. 10 mm left upper lobe nodular density on image number 49 may also be partially cavitary. A few other tiny scattered sub 4 mm nodules are noted which may be inflammatory. Underlying emphysematous changes and chronic bronchitic changes, likely related to smoking. No infiltrates or effusions. Upper Abdomen: No significant upper abdominal findings. No obvious hepatic or adrenal gland lesions. Musculoskeletal: Surgical changes from a total right shoulder arthroplasty are noted. No worrisome bone lesions are identified. Scoliosis and degenerative changes involving the thoracic spine. Advanced degenerate disc disease at T11-12 with a moderate-sized left paracentral disc  extrusion down behind the T12 vertebral body on the left side. Right paracentral calcified disc protrusion noted at L1-2. IMPRESSION: 1. 10 mm right upper lobe and left upper lobe pulmonary nodules are somewhat suspicious for neoplastic process. With history of smoking and emphysematous changes I would recommend a PET-CT for further evaluation. 2. Emphysematous changes and pulmonary scarring. No interstitial lung disease or bronchiectasis. 3. No mediastinal or hilar mass or adenopathy. 4. Surgical changes from coronary artery bypass surgery and aortic valve surgery. Mild cardiac enlargement. Aortic Atherosclerosis (ICD10-I70.0) and Emphysema (ICD10-J43.9). Electronically Signed   By: Marijo Sanes M.D.   On: 03/10/2019 08:26   Nm Pet Image Initial (pi) Skull Base To Thigh  Result Date: 03/28/2019 CLINICAL DATA:  Initial treatment strategy for lung nodule. EXAM: NUCLEAR MEDICINE PET SKULL BASE TO THIGH TECHNIQUE: 5.3 mCi F-18 FDG was injected intravenously. Full-ring PET imaging was performed from the skull base to thigh after the radiotracer. CT data was obtained and used for attenuation correction and anatomic localization. Fasting blood glucose: 88 mg/dl COMPARISON:  CT chest 03/10/2019 FINDINGS: Mediastinal blood pool activity: SUV max 2.3 Liver activity: SUV max NA NECK: No hypermetabolic lymph nodes in the neck. Incidental CT findings: none CHEST: The bilateral upper lobe pulmonary nodules in question on recent chest CT are identified on today's PET exam. 10 mm right upper lobe nodule 14/8) demonstrates SUV max = 0.62. 10 mm left upper lobe nodule (21/8) demonstrates SUV max = 0.65. No hypermetabolic lymphadenopathy in the chest. Incidental CT findings: Atelectasis noted posterior left base. Heart is enlarged. Coronary artery calcification is evident. Atherosclerotic calcification is noted in the wall of the thoracic aorta. Scattered areas of architectural distortion in the upper lungs bilaterally was  better demonstrated on previous diagnostic chest CT. ABDOMEN/PELVIS: No abnormal hypermetabolic activity within the liver, pancreas, adrenal glands, or spleen. No hypermetabolic lymph nodes in the abdomen or pelvis. Focal hypermetabolic FDG accumulation is identified in the anus. Incidental CT findings: Probable tiny calcified gallstone on image 113/series 4. There is abdominal aortic atherosclerosis without aneurysm. Diffuse diverticular disease noted in the colon. Trace free fluid evident in the pelvis. 2.8 cm exophytic cyst noted interpolar right kidney. SKELETON: No focal hypermetabolic activity to suggest skeletal metastasis. Incidental CT findings: Bones are diffusely demineralized. Status post lumbar fusion. IMPRESSION: 1. No hypermetabolism in the 10 mm right upper lobe and left upper lobe nodules. This is reassuring and given the other areas of subtle nodularity/architectural distortion, features likely reflect post infectious/postinflammatory scarring. While the lack of FDG accumulation is reassuring, low-grade or well differentiated neoplasm can be poorly FDG avid and continued surveillance likely warranted. 2. Focal hypermetabolism in the anus. This can be physiologic but correlation suggested to exclude anal neoplasm. 3.  Aortic Atherosclerois (ICD10-170.0) 4. Colonic diverticulosis  without diverticulitis. 5. Trace free fluid noted in the pelvis, indeterminate. Electronically Signed   By: Misty Stanley M.D.   On: 03/28/2019 11:47   Dg Bone Density (dxa)  Result Date: 03/05/2019 EXAM: DUAL X-RAY ABSORPTIOMETRY (DXA) FOR BONE MINERAL DENSITY IMPRESSION: Referring Physician:  Rosita Kea Your patient completed a BMD test using Lunar IDXA DXA system ( analysis version: 16 ) manufactured by [employer]. Technologist: WLS PATIENT: Name: LESS, WOOLSEY Patient ID: [id number] Birth Date: Jan 23, 1935 Height: 61.5 in. Sex: Male Measured: 03/05/2019 Weight: 106.4 lbs. Indications: Advanced Age, Caucasian,  History of Fracture (Adult) (V15.51), Low Calcium Intake (269.3), Prednisone Fractures: Shoulder Treatments: Vitamin D (E933.5) ASSESSMENT: The BMD measured at Forearm Radius 33% is 0.691 g/cm2 with a T-score of -3.1. This patient is considered osteoporotic according to [facility]) criteria. There has been a statistically significant decrease in BMD of Left hip since prior exam dated 05/10/1999. The scan quality is limited by patient body habitus. Lumbar spine was not utilized due to advanced degenerative changes and surgical hardware. rior DXA exam performed on Hologic device measures only unilateral hip (not Total Mean Hip). Therefore, current Total Mean Hip cannot be compared to prior exam. Site Region Measured Date Measured Age YA BMD Significant CHANGE T-score Left Forearm Radius 33% 03/05/2019 82.4 -3.1 0.691 g/cm2 DualFemur Neck Left 03/05/2019 82.4 -1.4 0.847 g/cm2 * DualFemur Neck Left 05/10/1999 62.5 -0.3 1.001 g/cm2 DualFemur Total Mean 5/05/2019 82.4 -1.0 0.877 g/cm2 World Health Organization [facility]) criteria for post-menopausal, Caucasian Women: Normal       T-score at or above -1 SD Osteopenia   T-score between -1 and -2.5 SD Osteoporosis T-score at or below -2.5 SD RECOMMENDATION: 1. All patients should optimize calcium and vitamin D intake. 2. Consider FDA approved medical therapies in postmenopausal women and men aged 56 years and older, based on the following: a. A hip or vertebral (clinical or morphometric) fracture b. T- score < or = -2.5 at the femoral neck or spine after appropriate evaluation to exclude secondary causes c. Low bone mass (T-score between -1.0 and -2.5 at the femoral neck or spine) and a 10 year probability of a hip fracture > or = 3% or a 10 year probability of a major osteoporosis-related fracture > or = 20% based on the [place]-adapted WHO algorithm d. Clinician judgment and/or patient preferences may indicate treatment for people with 10-year fracture  probabilities above or below these levels FOLLOW-UP: Patients with diagnosis of osteoporosis or at high risk for fracture should have regular bone mineral density tests. For patients eligible for Medicare, routine testing is allowed once every 2 years. The testing frequency can be increased to one year for patients who have rapidly progressing disease, those who are receiving or discontinuing medical therapy to restore bone mass, or have additional risk factors. Electronically Signed   By: Marlaine Hind M.D.   On: 03/05/2019 08:49   Vas US Carotid  Result Date: 04/03/2019 Carotid Arterial Duplex Study Indications:       Pre- op. Comparison Study:  no prior Performing Technologist: Abram Sander RVS  Examination Guidelines: A complete evaluation includes B-mode imaging, spectral Doppler, color Doppler, and power Doppler as needed of all accessible portions of each vessel. Bilateral testing is considered an integral part of a complete examination. Limited examinations for reoccurring indications may be performed as noted.  Right Carotid Findings: +----------+--------+--------+--------+-----------+--------+             PSV cm/s EDV cm/s Stenosis  Describe    Comments  +----------+--------+--------+--------+-----------+--------+  CCA Prox   80                         homogeneous           +----------+--------+--------+--------+-----------+--------+  CCA Distal 83       11                homogeneous           +----------+--------+--------+--------+-----------+--------+  ICA Prox   97       16       1-39%    homogeneous           +----------+--------+--------+--------+-----------+--------+  ICA Distal 122      24                                      +----------+--------+--------+--------+-----------+--------+  ECA        74                                               +----------+--------+--------+--------+-----------+--------+ +----------+--------+-------+--------+-------------------+             PSV cm/s EDV  cms Describe Arm Pressure (mmHG)  +----------+--------+-------+--------+-------------------+  Subclavian 85                                             +----------+--------+-------+--------+-------------------+ +---------+--------+--+--------+--+---------+  Vertebral PSV cm/s 94 EDV cm/s 10 Antegrade  +---------+--------+--+--------+--+---------+  Left Carotid Findings: +----------+--------+--------+--------+-----------+--------+             PSV cm/s EDV cm/s Stenosis Describe    Comments  +----------+--------+--------+--------+-----------+--------+  CCA Prox   76       10                homogeneous           +----------+--------+--------+--------+-----------+--------+  CCA Distal 60       15                homogeneous           +----------+--------+--------+--------+-----------+--------+  ICA Prox   80       17       1-39%    homogeneous           +----------+--------+--------+--------+-----------+--------+  ICA Distal 115      16                                      +----------+--------+--------+--------+-----------+--------+  ECA        66                                               +----------+--------+--------+--------+-----------+--------+ +----------+--------+--------+--------+-------------------+  Subclavian PSV cm/s EDV cm/s Describe Arm Pressure (mmHG)  +----------+--------+--------+--------+-------------------+             51                                              +----------+--------+--------+--------+-------------------+ +---------+--------+--------+--------------+  Vertebral PSV cm/s EDV cm/s Not identified  +---------+--------+--------+--------------+  Summary: Right Carotid: Velocities in the right ICA are consistent with a 1-39% stenosis. Left Carotid: Velocities in the left ICA are consistent with a 1-39% stenosis. Vertebrals: Right vertebral artery demonstrates antegrade flow. Left vertebral             artery was not visualized. *See table(s) above for measurements and observations.   Electronically signed by Monica Martinez MD on 04/03/2019 at 12:36:34 PM.    Final    Disposition   Pt is being discharged home today in good condition.  Follow-up Plans & Appointments    Follow-up Information    Rexene Alberts, MD Follow up on 04/14/2019.   Specialty: Cardiothoracic Surgery Why: 130pm Contact information: [address] [place]  [address] [place] Office Follow up.   Specialty: Cardiology Why: You will be contacted for pre-op workup for TAVR. Please expect a call form our office in the next day or two  Contact information: [address], [place] [phone]         Discharge Instructions    Call MD for:  difficulty breathing, headache or visual disturbances   Complete by: As directed    Call MD for:  extreme fatigue   Complete by: As directed    Call MD for:  hives   Complete by: As directed    Call MD for:  persistant dizziness or light-headedness   Complete by: As directed    Call MD for:  persistant nausea and vomiting   Complete by: As directed    Call MD for:  redness, tenderness, or signs of infection (pain, swelling, redness, odor or green/yellow discharge around incision site)   Complete by: As directed    Call MD for:  severe uncontrolled pain   Complete by: As directed    Call MD for:  temperature >100.4   Complete by: As directed    Diet - low sodium heart healthy   Complete by: As directed    Discharge instructions   Complete by: As directed    Please expect our office to be in contact with you today or tomorrow in regards to your pre-op procedures.   No driving for 2 days. No lifting over 5 lbs for 1 week. No sexual activity for 1 week. Keep procedure site clean & dry. If you notice increased pain, swelling, bleeding or pus, call/return!  You may shower, but no soaking baths/hot tubs/pools for 1 week.   Increase activity slowly    Complete by: As directed      Discharge Medications   Allergies as of 04/03/2019   No Known Allergies     Medication List    TAKE these medications   acetaminophen 500 MG tablet Commonly known as: TYLENOL Take 1,000 mg by mouth every 6 (six) hours as needed for moderate pain or headache.   aspirin 81 MG EC tablet Take 1 tablet (81 mg total) by mouth daily.   cholecalciferol 25 MCG (1000 UT) tablet Commonly known as: VITAMIN D3 Take 1,000 Units by mouth daily.   folic acid 1 MG tablet Commonly known as: FOLVITE Take 1 mg by mouth daily.   furosemide 40 MG tablet Commonly known as: LASIX Take 2 tablets(80MG ) by mouth once a day for 3 days, then decrease to 1 tablet(40MG ) by mouth once a day   leflunomide 20  MG tablet Commonly known as: ARAVA Take 20 mg by mouth daily.   MEGARED OMEGA-3 KRILL OIL PO Take 750 mg by mouth daily.   methotrexate 2.5 MG tablet Commonly known as: RHEUMATREX Take 15 mg by mouth every Monday. In the morning. Caution:Chemotherapy. Protect from light.   metoprolol succinate 25 MG 24 hr tablet Commonly known as: TOPROL-XL Take 1 tablet by mouth once daily   multivitamin with minerals Tabs tablet Take 1 tablet by mouth daily.   oxyCODONE 5 MG immediate release tablet Commonly known as: Roxicodone Take 1 tablet (5 mg total) by mouth every 4 (four) hours as needed for severe pain or breakthrough pain.   pantoprazole 40 MG tablet Commonly known as: PROTONIX Take 40 mg by mouth daily before breakfast.   potassium chloride SA 20 MEQ tablet Commonly known as: K-DUR Take 2 tablets(40MEQ) by mouth once a day for 3 days, then decrease to 1 tablet(20MEQ) by mouth once a day.   predniSONE 5 MG tablet Commonly known as: DELTASONE Take 5 mg by mouth daily with breakfast.   tamsulosin 0.4 MG Caps capsule Commonly known as: FLOMAX Take 0.8 mg by mouth daily.   Theratears 0.25 % Soln Generic drug: Carboxymethylcellulose Sodium Place 1 drop  into both eyes 3 (three) times daily as needed (dry/irritated eyes.).   Turmeric 500 MG Caps Take 500 mg by mouth daily.   vitamin B-12 1000 MCG tablet Commonly known as: CYANOCOBALAMIN Take 1,000 mcg by mouth daily.        Acute coronary syndrome (MI, NSTEMI, STEMI, etc) this admission?: No.    Outstanding Labs/Studies   TAVR workup   Duration of Discharge Encounter   Greater than 30 minutes including physician time.  Signed, Kathyrn Drown, NP 04/03/2019, 12:53 PM   Please see my full note from today.  Will continue TAVR workup as outpatient. Unable to complete TAVR CT scans as an inpatient due to staffing for reading of the CT scans.   Lauree Chandler 04/03/2019 12:55 PM

## 2019-04-03 NOTE — Progress Notes (Addendum)
Progress Note  Patient Name: Joshua Hudson Date of Encounter: 04/03/2019  Primary Cardiologist: Dr. [name], MD  Subjective   Feeling well today. Denies SOB, chest pain. Has LE edema  Inpatient Medications    Scheduled Meds: . aspirin EC  81 mg Oral Daily  . cholecalciferol  1,000 Units Oral Daily  . folic acid  1 mg Oral Daily  . furosemide  40 mg Oral Daily  . leflunomide  20 mg Oral Daily  . metoprolol succinate  25 mg Oral Daily  . multivitamin with minerals  1 tablet Oral Daily  . pantoprazole  40 mg Oral QAC breakfast  . potassium chloride SA  20 mEq Oral Daily  . predniSONE  5 mg Oral Q breakfast  . sodium chloride flush  3 mL Intravenous Q12H  . sodium chloride flush  3 mL Intravenous Q12H  . tamsulosin  0.8 mg Oral Daily  . vitamin B-12  1,000 mcg Oral Daily   Continuous Infusions: . sodium chloride     PRN Meds: sodium chloride, acetaminophen, ondansetron (ZOFRAN) IV, oxyCODONE, sodium chloride flush   Vital Signs    Vitals:   04/02/19 1820 04/02/19 1920 04/02/19 2230 04/03/19 0620  BP: (!) 104/57 (!) 105/40 (!) 94/43 (!) 102/51  Pulse: (!) 59 90 62 64  Resp:   18 20  Temp:   98.7 F (37.1 C) 97.7 F (36.5 C)  TempSrc:   Oral Oral  SpO2: 100% 99% 99% 100%  Weight:    46.9 kg    Intake/Output Summary (Last 24 hours) at 04/03/2019 0837 Last data filed at 04/03/2019 0641 Gross per 24 hour  Intake 840 ml  Output 700 ml  Net 140 ml   Filed Weights   04/03/19 0620  Weight: 46.9 kg    Physical Exam   General: Elderly, frail, NAD Neck: Negative for carotid bruits. No JVD Lungs:Clear to ausculation bilaterally. No wheezes, rales, or rhonchi. Breathing is unlabored. Cardiovascular: RRR with S1 S2. + murmurs Abdomen: Soft, non-tender, non-distended. No obvious abdominal masses. MSK: Strength and tone appear normal for age. 5/5 in all extremities Extremities: 2+ BLE edema. No clubbing or cyanosis. DP/PT pulses 2+ bilaterally Neuro:  Alert and oriented. No focal deficits. No facial asymmetry. MAE spontaneously. Psych: Responds to questions appropriately with normal affect.    Labs    Chemistry Recent Labs  Lab 04/02/19 1027  04/02/19 1228 04/02/19 1238 04/03/19 0355  NA 143   < > 143 143 141  K 5.4*   < > 3.5 3.4* 3.8  CL 107  --   --   --  107  CO2 27  --   --   --  26  GLUCOSE 83  --   --   --  82  BUN 23  --   --   --  22  CREATININE 1.14  --   --   --  1.13  CALCIUM 8.4*  --   --   --  8.7*  GFRNONAA 60*  --   --   --  >60  GFRAA >60  --   --   --  >60  ANIONGAP 9  --   --   --  8   < > = values in this interval not displayed.     Hematology Recent Labs  Lab 04/02/19 1027  04/02/19 1228 04/02/19 1238 04/03/19 0355  WBC 4.6  --   --   --  5.3  RBC 3.13*  --   --   --  3.23*  HGB 7.1*   < > 8.2* 7.8* 7.3*  HCT 22.0*   < > 24.0* 23.0* 23.1*  MCV 70.3*  --   --   --  71.5*  MCH 22.7*  --   --   --  22.6*  MCHC 32.3  --   --   --  31.6  RDW 19.6*  --   --   --  18.6*  PLT 81*  --   --   --  63*   < > = values in this interval not displayed.    Cardiac EnzymesNo results for input(s): TROPONINI in the last 168 hours. No results for input(s): TROPIPOC in the last 168 hours.   BNPNo results for input(s): BNP, PROBNP in the last 168 hours.   DDimer No results for input(s): DDIMER in the last 168 hours.   Radiology    No results found.  Telemetry    04/03/2019 NSR - Personally Reviewed  ECG    03/18/2019 NSR with non specific T wave abnormalities, HR 64  Personally Reviewed  Cardiac Studies   Right and left cardiac catheterization 04/02/2019:  Angiographically minimal CAD  The left ventricular systolic function is normal -as assessed by aortic root angiogram  There is severe (4+) aortic regurgitation.  LV end diastolic pressure is moderately elevated.   SUMMARY  Angiographically normal coronary arteries with a right dominant system.  Severe aortic insufficiency seen on aortic  root angiogram  Mild to moderate elevated LVEDP and PCWP  RECOMMENDATIONS  We will place the patient in extended recovery/observation status to allow for monitoring of hemoglobin post procedure as well as to obtain consultations from the cardiac structural team and potentially hematology to assess his ongoing anemia.  Anticipate that he should be able to home tomorrow, but would consider checking Cardiac CTA for pre-TAVR prior to discharge.  Otherwise continue home meds.  TEE 04/02/2019: 1. The left ventricle has normal systolic function, with an ejection fraction of 60-65%. The cavity size was mildly dilated. Left ventricular diastolic function could not be evaluated.  2. The right ventricle has normal systolc function. The cavity was normal. There is no increase in right ventricular wall thickness.  3. No evidence of a thrombus present in the left atrial appendage.  4. Aortic valve regurgitation is severe by color flow Doppler. Mild stenosis of the aortic valve.  5. No vegetation on the aortic valve.  6. 25 mm Edwards stented bioprosthetic valve. Leaflets thickened and calcified Sever AR tha appears central and not peri valvular. Suggest aortic root injection at cath and TAVR CTA to further assess possible valve in valve Rx.  7. Pulmonic valve regurgitation is mild by color flow Doppler.  8. The aortic root is normal in size and structure.  9. Extensive use of 3D imaging done to assess AVR.  Echocardiogram 03/18/2019:  1. The left ventricle has normal systolic function, with an ejection fraction of 55-60%. The cavity size was normal. Left ventricular diastolic Doppler parameters are consistent with restrictive filling. Elevated left atrial and left ventricular  end-diastolic pressures There is right ventricular volume and pressure overload.  2. The right ventricle has mildly reduced systolic function. The cavity was mildly enlarged. There is no increase in right ventricular wall  thickness. Right ventricular systolic pressure is severely elevated with an estimated pressure of 64.2 mmHg.  3. Left atrial size was moderately dilated.  4. Right atrial size was  moderately dilated.  5. Mitral valve regurgitation is moderate by color flow Doppler.  6. Tricuspid valve regurgitation is moderate.  7. S/P AVR with a 25 mm Edwards bioprosthetic valve. Aortic valve regurgitation is moderate by color flow Doppler. No stenosis of the aortic valve.  8. The inferior vena cava was dilated in size with >50% respiratory variability.  Patient Profile     84 y.o. male with history of severe AS s/p AVR 2009 with post op afib-no recurrence, nonobstructive CAD cath 2009-20-30% OM2 who is being seen by cardiology service for severe aortic regurgitation  Assessment & Plan    1. Aortic Insufficiency with remote AVR for severe AS in 2009: -Right and left cardiac cath performed 04/02/2019 with angiographically normal coronary arteries with a right dominant system. Severe aortic insufficiency seen on aortic root angiogram with mild to moderate elevated LVEDP and PCWP -Follow-up TEE with LVEF of 60-65% and severe aortic valve regurgitation, 25 mm Edwards stented bioprosthetic valve in place. Aortic valve leaflets thickened and calcified with severe AR that appears central and not peri valvular. -Recommendations were for TAVR CTA to further assess possible valve in valve Rx>>TVAR coordinator to set up scans   3. ASCAD: -Nonobstructive disease per cardiac cath in 2009 with a 20 to 30% OM2 -In 2015 nonobstructive disease by coronary CTA in all 3 vessels -Seen by Dr. [name] with worsening dyspnea therefore plan was for [facility] for further evaluation as above -Denies anginal symptoms  -Continue ASA, beta-blocker  4.  Recent worsening LE edema: -Will give IV Lasix 40mg  today and transition back to PO tomorrow given BLE on exam  -Denies SOB  -Creatinine, 1.13 which appears to be at baseline    6.  Chronic diastolic CHF: -Echo as above -Will change Lasix to IV today and monitor for response   6.  Anemia: -Hemoglobin, 7.3 today with a baseline of 7.0-10.0 -Trend  -No acute s/s/ of bleeding    Signed, Kathyrn Drown NP-C [facility] Pager: [phone] 04/03/2019, 8:37 AM     For questions or updates, please contact   Please consult [email or web address] for contact info under Cardiology/STEMI.  I have personally seen and examined this patient with Kathyrn Drown, NP. I agree with the assessment and plan as outlined above.  Joshua Hudson is a pleasant 84 yo male with history of chronic anemia, severe AS s/p AVR with bioprosthetic AVR in 2009, normal coronary arteries with recent worsening dyspnea. Echo with moderate to severe AI. He was admitted yesterday for a cardiac cath which showed normal coronary arteries and mildly elevated filling pressures. TEE yesterday with severe central AI due to valve leaflet failure.  This am he reports feeling well. Denies dyspnea. No chest pain He has c/o LE edema Labs reviewed by me. Echo images reviewed by me.   My exam:  General: Thin elderly male in NAD HEENT: OP clear, mucus membranes moist  SKIN: warm, dry. No rashes. Neuro: No focal deficits  Musculoskeletal: Muscle strength 5/5 all ext  Psychiatric: Mood and affect normal  Neck: No JVD, no carotid bruits, no thyromegaly, no lymphadenopathy.  Lungs:Clear bilaterally, no wheezes, rhonci, crackles Cardiovascular: Regular rate and rhythm. Diastolic murmur noted.  Abdomen:Soft. Bowel sounds present. Non-tender.  Extremities: 1+ bilateral pitting lower extremity edema.   Plan: Severe insufficiency of the bioprosthetic AVR: Fortunately he has normal LV systolic function and no CAD. His valve will need to be addressed. We will plan to obtain the pre-TAVR cardiac CT and CTA chest/abd/pelvis today. Carotid artery  dopplers. Diurese with IV Lasix today. Anticipate home tomorrow and further planning for  TAVR following scans.   Lauree Chandler 04/03/2019 10:26 AM

## 2019-04-07 ENCOUNTER — Ambulatory Visit (HOSPITAL_COMMUNITY)
Admission: RE | Admit: 2019-04-07 | Discharge: 2019-04-07 | Disposition: A | Discharge status: Home / Self Care | Payer: Medicare Other | Source: Home / Self Care | Attending: Cardiovascular Disease | Admitting: Cardiovascular Disease

## 2019-04-07 ENCOUNTER — Other Ambulatory Visit: Payer: Self-pay

## 2019-04-07 ENCOUNTER — Encounter (HOSPITAL_COMMUNITY): Payer: Self-pay

## 2019-04-07 ENCOUNTER — Ambulatory Visit (HOSPITAL_COMMUNITY): Payer: Medicare Other

## 2019-04-07 DIAGNOSIS — I2510 Atherosclerotic heart disease of native coronary artery without angina pectoris: Secondary | ICD-10-CM | POA: Diagnosis not present

## 2019-04-07 DIAGNOSIS — I351 Nonrheumatic aortic (valve) insufficiency: Secondary | ICD-10-CM

## 2019-04-07 DIAGNOSIS — R0602 Shortness of breath: Secondary | ICD-10-CM

## 2019-04-07 MED ORDER — IOHEXOL 350 MG/ML SOLN
100.0000 mL | Freq: Once | INTRAVENOUS | Status: AC | PRN
  Administered 2019-04-07: 100 mL via INTRAVENOUS

## 2019-04-08 ENCOUNTER — Other Ambulatory Visit: Payer: Self-pay

## 2019-04-08 DIAGNOSIS — I351 Nonrheumatic aortic (valve) insufficiency: Secondary | ICD-10-CM

## 2019-04-10 ENCOUNTER — Encounter: Payer: Self-pay | Admitting: Thoracic Surgery (Cardiothoracic Vascular Surgery)

## 2019-04-10 ENCOUNTER — Institutional Professional Consult (permissible substitution): Payer: Medicare Other | Admitting: Thoracic Surgery (Cardiothoracic Vascular Surgery)

## 2019-04-10 ENCOUNTER — Other Ambulatory Visit: Payer: Self-pay

## 2019-04-10 VITALS — BP 102/50 | HR 72 | Temp 97.7°F | Resp 18 | Ht 63.0 in | Wt 109.0 lb

## 2019-04-10 DIAGNOSIS — I350 Nonrheumatic aortic (valve) stenosis: Secondary | ICD-10-CM | POA: Diagnosis not present

## 2019-04-10 DIAGNOSIS — Z953 Presence of xenogenic heart valve: Secondary | ICD-10-CM | POA: Diagnosis not present

## 2019-04-10 DIAGNOSIS — I351 Nonrheumatic aortic (valve) insufficiency: Secondary | ICD-10-CM

## 2019-04-10 DIAGNOSIS — T8209XA Other mechanical complication of heart valve prosthesis, initial encounter: Secondary | ICD-10-CM

## 2019-04-10 NOTE — H&P (View-Only) (Signed)
[facility] CONSULTATION REPORT  Referring Provider is Leonie Man, MD Primary Cardiologist is [name], MD PCP is Lavone Orn, MD  Chief Complaint  Patient presents with   Aortic Stenosis    TAVR consultation, review all studies    HPI:  Patient is an 84 year old male status post aortic valve replacement using a bioprosthetic tissue valve in 2009 for severe aortic stenosis who has been referred for surgical consultation to discuss treatment options for management of prosthetic valve dysfunction with severe aortic insufficiency and acute diastolic congestive heart failure.  Patient underwent aortic valve replacement using a 25 mm Edwards magna stented bovine pericardial tissue valve by Dr. Servando Snare in 2009 for severe symptomatic aortic stenosis.  His early postoperative recovery was notable for postoperative atrial fibrillation which has not recurred.  He has done well from a cardiac standpoint and been followed carefully by Dr. Radford Pax.  Echocardiogram performed in January of this year revealed normal left ventricular systolic function and mild aortic insufficiency.  Transvalvular gradients across the bioprosthetic tissue valve were normal at that time.  Over the past 2 to 3 months the patient has developed relatively acute onset of shortness of breath, chest discomfort, orthopnea, and lower extremity edema.  Initially symptoms were primarily with physical exertion but symptoms have progressed fairly rapidly such that now the patient gets short of breath with minimal activity and occasionally at rest.  He cannot lay flat in bed.  He has lower extremity edema.  He has had some tightness across his chest.  He has also had some dizzy spells with near syncope.  He was seen in follow-up by Dr. Radford Pax and transthoracic echocardiogram performed March 18, 2019 revealed normal left ventricular systolic function and at  least moderate aortic insufficiency.  The patient subsequently underwent transesophageal echocardiogram and diagnostic cardiac catheterization on April 02, 2019.  TEE confirmed the presence of severe prosthetic valve dysfunction with severe aortic insufficiency.  There was no vegetation on the aortic valve and no other findings to suggest the presence of endocarditis.  Left ventricular systolic function was normal.  There is mild left ventricular chamber enlargement.  Diagnostic cardiac catheterization revealed normal coronary arteries with no significant coronary artery disease.  There was severe aortic insufficiency on room.  There is moderately elevated left ventricular end-diastolic pressure and pulmonary capillary wedge pressure consistent with acute diastolic congestive heart failure.  CT angiography was performed and the patient referred for surgical consultation.  Patient is married and lives locally in [place] with his wife.  His wife suffers from dementia.  He is accompanied by his daughter for his office consultation today.  He has been retired since 1997 having previously worked at [employer].  He has remained physically active throughout retirement and has been quite active physically until recently.  He has had problems with his right shoulder and underwent right shoulder replacement more than 1 year ago.  He has had multiple problems ever since with an attempt at revision of his shoulder arthroplasty which subsequently became infected, requiring removal of the hardware and placement of temporary plastic hardware.  He has been evaluated for redo shoulder replacement in the future.  He otherwise has no physical limitations.  His ambulation is normal and prior to the development of heart failure over the last few months he was quite active physically.  He specifically denies any history of fevers, chills, or weight loss.  He has  decreased energy.  He denies any history of  hematochezia, hematemesis, or melena.  He has recently been found to be anemic, and all of this has developed since he developed symptoms of acute heart failure.  He is chronically immunosuppressed having been on systemic steroids using prednisone for many years because of rheumatoid arthritis.  He is now also taking methotrexate.  Past Medical History:  Diagnosis Date   Anemia    Previous history of anemia   Aortic insufficiency 03/24/2019   AI of bioprosthetic AVR   Chronic diastolic CHF (congestive heart failure) ([place]) 03/24/2019   Colon polyps    s/p diverticular perforation requiring 2-stage repair   Diverticulosis    DJD (degenerative joint disease), lumbosacral    Esophageal stricture    GERD (gastroesophageal reflux disease)    History of blood transfusion    patient states "years ago"   History of kidney stones    passed stones   Hypertension    Mitral regurgitation    moderate   Occipital neuralgia    Postoperative atrial fibrillation ([place]) 05/10/2015   Prosthetic valve dysfunction    Rheumatoid arthritis (HCC)    s/o long term steroids  shoulders and hands   Rotator cuff arthropathy    right   S/P aortic valve replacement with bioprosthetic valve 01/16/2008   20m Edwards Magna perimount bovine pericardial tissue valve, model 3000   SBE (subacute bacterial endocarditis) prophylaxis candidate    for dental procedures   Severe aortic stenosis    S/P prosthetic valve replacement w 25 mm Edwards like science percardial tissue valve,[facility] - 01/2008    Past Surgical History:  Procedure Laterality Date   APPENDECTOMY     BOTOX INJECTION N/A 01/22/2018   Procedure: BOTOX INJECTION;  Surgeon: KRonnette Juniper MD;  Location: [facility];  Service: Gastroenterology;  Laterality: N/A;   CARDIAC CATHETERIZATION     09   CARDIAC VALVE REPLACEMENT  01/2008   aortic valve replacement   CATARACT EXTRACTION W/ INTRAOCULAR LENS  IMPLANT, BILATERAL     COLON  RESECTION     diverticulitis    dental implants     permanent   ESOPHAGEAL MANOMETRY N/A 11/07/2017   Procedure: ESOPHAGEAL MANOMETRY (EM);  Surgeon: KRonnette Juniper MD;  Location: [facility];  Service: Gastroenterology;  Laterality: N/A;   ESOPHAGOGASTRODUODENOSCOPY (EGD) WITH PROPOFOL N/A 01/22/2018   Procedure: ESOPHAGOGASTRODUODENOSCOPY (EGD) WITH PROPOFOL;  Surgeon: KRonnette Juniper MD;  Location: [facility];  Service: Gastroenterology;  Laterality: N/A;   EXCISIONAL TOTAL SHOULDER ARTHROPLASTY WITH ANTIBIOTIC SPACER Right 12/31/2018   Procedure: EXCISIONAL TOTAL SHOULDER ARTHROPLASTY WITH ANTIBIOTIC SPACER;  Surgeon: NNetta Cedars MD;  Location[facility]  Service: Orthopedics;  Laterality: Right;   HERNIA REPAIR     IRRIGATION AND DEBRIDEMENT SHOULDER Right 11/20/2018    IRRIGATION AND DEBRIDEMENT SHOULDER WITH POLY EXCHANGE (Right Shoulder)   IRRIGATION AND DEBRIDEMENT SHOULDER Right 11/20/2018   Procedure: IRRIGATION AND DEBRIDEMENT SHOULDER WITH POLY EXCHANGE;  Surgeon: NNetta Cedars MD;  Location: [facility]  Service: Orthopedics;  Laterality: Right;   LUMBAR LAMINECTOMY     x 2   REVERSE SHOULDER ARTHROPLASTY Right 03/01/2018   Procedure: RIGHT REVERSE SHOULDER ARTHROPLASTY;  Surgeon: NNetta Cedars MD;  Location: [facility]  Service: Orthopedics;  Laterality: Right;   RIGHT/LEFT HEART CATH AND CORONARY ANGIOGRAPHY N/A 04/02/2019   Procedure: RIGHT/LEFT HEART CATH AND CORONARY ANGIOGRAPHY;  Surgeon: HLeonie Man MD;  [facility];  Service: Cardiovascular;  Laterality: N/A;   SAVORY  DILATION N/A 01/22/2018   Procedure: SAVORY DILATION;  Surgeon: Ronnette Juniper, MD;  Location: [facility];  Service: Gastroenterology;  Laterality: N/A;   SHOULDER HEMI-ARTHROPLASTY Right 06/14/2018   Procedure: RIGHT  REVERSE TOTAL SHOULDER OPEN POLY EXCHANGE;  Surgeon: Netta Cedars, MD;  Location: [facility];  Service: Orthopedics;  Laterality: Right;   TEE WITHOUT CARDIOVERSION N/A  01/29/2014   Procedure: TRANSESOPHAGEAL ECHOCARDIOGRAM (TEE);  Surgeon: Sueanne Margarita, MD;  Location: [facility] ENDOSCOPY;  Service: Cardiovascular;  Laterality: N/A;   TEE WITHOUT CARDIOVERSION N/A 04/02/2019   Procedure: TRANSESOPHAGEAL ECHOCARDIOGRAM (TEE);  Surgeon: Josue H[place], MD;  Location: [facility] ENDOSCOPY;  Service: Cardiovascular;  Laterality: N/A;   TONSILLECTOMY      Family History  Problem Relation Age of Onset   Other Mother        NO HEALTH PROBLEMS   Heart disease Father    Arthritis Father    Heart Problems Sister        RELATED TO A MVA   Suicidality Brother    Other Sister        3 SISTERS IN GOOD HEALTH   Other Brother        2 BROTHERS IN GOOD HEALTH   Other Daughter        2 IN GOOD HEALTH    Social History   Socioeconomic History   Marital status: Married    Spouse name: Not on file   Number of children: 2   Years of education: Not on file   Highest education level: Not on file  Occupational History   Occupation: Retired Market researcher at [employer]: Not on file   Food insecurity    Worry: Not on file    Inability: Not on file   Transportation needs    Medical: Not on file    Non-medical: Not on file  Tobacco Use   Smoking status: Former Smoker    Packs/day: 0.50    Years: 10.00    Pack years: 5.00    Types: Cigarettes    Start date: 01/16/1974    Quit date: 09/05/1983    Years since quitting: 35.6   Smokeless tobacco: Former Systems developer    Types: Chew  Substance and Sexual Activity   Alcohol use: Not Currently    Alcohol/week: 0.0 standard drinks   Drug use: No   Sexual activity: Not on file  Lifestyle   Physical activity    Days per week: Not on file    Minutes per session: Not on file   Stress: Not on file  Relationships   Social connections    Talks on phone: Not on file    Gets together: Not on file    Attends religious service: Not on file    Active member of club or organization: Not on  file    Attends meetings of clubs or organizations: Not on file    Relationship status: Not on file   Intimate partner violence    Fear of current or ex partner: Not on file    Emotionally abused: Not on file    Physically abused: Not on file    Forced sexual activity: Not on file  Other Topics Concern   Not on file  Social History Narrative   Not on file    Current Outpatient Medications  Medication Sig Dispense Refill   acetaminophen (TYLENOL) 500 MG tablet Take 1,000 mg by mouth every 6 (six) hours as needed for moderate  pain or headache.     aspirin EC 81 MG EC tablet Take 1 tablet (81 mg total) by mouth daily. 30 tablet    cholecalciferol (VITAMIN D3) 25 MCG (1000 UT) tablet Take 1,000 Units by mouth daily.     folic acid (FOLVITE) 1 MG tablet Take 1 mg by mouth daily.     furosemide (LASIX) 40 MG tablet Take 2 tablets(80MG) by mouth once a day for 3 days, then decrease to 1 tablet(40MG) by mouth once a day (Patient taking differently: Take 40 mg by mouth daily. ) 90 tablet 3   leflunomide (ARAVA) 20 MG tablet Take 20 mg by mouth daily.     MEGARED OMEGA-3 KRILL OIL PO Take 750 mg by mouth daily.      methotrexate (RHEUMATREX) 2.5 MG tablet Take 15 mg by mouth every Monday. Caution:Chemotherapy. Protect from light.     metoprolol succinate (TOPROL-XL) 25 MG 24 hr tablet Take 1 tablet by mouth once daily (Patient taking differently: Take 25 mg by mouth daily. ) 90 tablet 1   Multiple Vitamin (MULTIVITAMIN WITH MINERALS) TABS tablet Take 1 tablet by mouth daily.     pantoprazole (PROTONIX) 40 MG tablet Take 40 mg by mouth daily before breakfast.      potassium chloride SA (K-DUR) 20 MEQ tablet Take 2 tablets(40MEQ) by mouth once a day for 3 days, then decrease to 1 tablet(20MEQ) by mouth once a day. (Patient taking differently: Take 20 mEq by mouth daily. ) 90 tablet 3   predniSONE (DELTASONE) 5 MG tablet Take 5 mg by mouth daily with breakfast.      tamsulosin  (FLOMAX) 0.4 MG CAPS capsule Take 0.8 mg by mouth daily.     THERATEARS 0.25 % SOLN Place 1 drop into both eyes 3 (three) times daily as needed (dry/irritated eyes.).     Turmeric 500 MG CAPS Take 500 mg by mouth daily.     vitamin B-12 (CYANOCOBALAMIN) 1000 MCG tablet Take 1,000 mcg by mouth daily.     oxyCODONE (ROXICODONE) 5 MG immediate release tablet Take 1 tablet (5 mg total) by mouth every 4 (four) hours as needed for severe pain or breakthrough pain. 20 tablet 0   No current facility-administered medications for this visit.     No Known Allergies    Review of Systems:   General:  normal appetite, decreased energy, no weight gain, no weight loss, no fever  Cardiac:  + chest pain with exertion, no chest pain at rest, +SOB with exertion, + resting SOB, + PND, + orthopnea, no palpitations, no arrhythmia, no atrial fibrillation, + LE edema, + dizzy spells, near syncope  Respiratory:  + shortness of breath, no home oxygen, no productive cough, no dry cough, no bronchitis, no wheezing, no hemoptysis, no asthma, no pain with inspiration or cough, no sleep apnea, no CPAP at night  GI:   no difficulty swallowing, no reflux, no frequent heartburn, no hiatal hernia, no abdominal pain, no constipation, no diarrhea, no hematochezia, no hematemesis, no melena  GU:   no dysuria,  no frequency, no urinary tract infection, no hematuria, + enlarged prostate, + kidney stones, no kidney disease  Vascular:  no pain suggestive of claudication, no pain in feet, no leg cramps, no varicose veins, no DVT, no non-healing foot ulcer  Neuro:   no stroke, no TIA's, no seizures, no headaches, no temporary blindness one eye,  no slurred speech, no peripheral neuropathy, no chronic pain, no instability of gait, no memory/cognitive  dysfunction  Musculoskeletal: + arthritis, + joint swelling, no myalgias, no difficulty walking, normal mobility   Skin:   no rash, no itching, no skin infections, no pressure sores or  ulcerations  Psych:   no anxiety, no depression, no nervousness, no unusual recent stress  Eyes:   no blurry vision, + floaters, no recent vision changes, does not wear glasses or contacts  ENT:   no hearing loss, no loose or painful teeth, no denture  Hematologic:  + easy bruising, + abnormal bleeding, no clotting disorder, no frequent epistaxis  Endocrine:  no diabetes, does not check CBG's at home           Physical Exam:   BP (!) 102/50 (BP Location: Left Arm, Patient Position: Sitting, Cuff Size: Small)    Pulse 72    Temp 97.7 F (36.5 C)    Resp 18    Ht '5\' 3"'  (1.6 m)    Wt 109 lb (49.4 kg)    SpO2 97% Comment: RA   BMI 19.31 kg/m   General:  Thin, elderly, pale-appearing male in NAD  HEENT:  Unremarkable   Neck:   no JVD, no bruits, no adenopathy   Chest:   Few bibasilar inspiratory crackles, symmetrical breath sounds, no wheezes, no rhonchi   CV:   RRR, grade IV/VI holodiastolic murmur heard best at LLSB  Abdomen:  soft, non-tender, no masses   Extremities:  warm, well-perfused, pulses palpable, 2+ bilateral LE edema  Rectal/GU  Deferred  Neuro:   Grossly non-focal and symmetrical throughout  Skin:   Clean and dry, no rashes, no breakdown   Diagnostic Tests:  Transthoracic Echocardiography  Patient:    Cayde, Held MR #:       [id number] Study Date: 09/11/2018 Gender:     M Age:        55 Height:     160 cm Weight:     45.8 kg BSA:        1.42 m^2 Pt. Status: Room:   Margaretmary Dys [id number]  JJOACZYSA    YTKZSWF, [employer]  SONOGRAPHER  Cindy Hazy, RDCS  PERFORMING   [facility], Outpatient  ATTENDING    Buford Dresser  cc:  -------------------------------------------------------------------  ------------------------------------------------------------------- Indications:      R60.9 Peripheral edema.  ------------------------------------------------------------------- History:   PMH:  Acquired from the patient and from the  patient&'s chart.  PMH:  Coronary artery disease. Pneumonia. Anemia. Postoperative atrial fibrillation. Rheumatoid Arthritis.  ------------------------------------------------------------------- Study Conclusions  - Left ventricle: The cavity size was normal. Systolic function was   normal. Wall motion was normal; there were no regional wall   motion abnormalities. Features are consistent with a pseudonormal   left ventricular filling pattern, with concomitant abnormal   relaxation and increased filling pressure (grade 2 diastolic   dysfunction). - Aortic valve: There was mild regurgitation. Mean gradient (S): 10   mm Hg. - Mitral valve: Calcified annulus. Moderately thickened, moderately   calcified leaflets . There was mild regurgitation. - Left atrium: The atrium was mildly dilated. - Right atrium: The atrium was mildly dilated. - Atrial septum: No defect or patent foramen ovale was identified.   There was redundancy of the septum, with borderline criteria for   aneurysm. - Tricuspid valve: There was moderate regurgitation. - Pulmonary arteries: PA peak pressure: 32 mm Hg (S). - Pericardium, extracardiac: A trivial pericardial effusion was   identified posterior to the heart.  Impressions:  - S/P prior  AVR. AV leaflets moderately thickened/calcified, but   gradients within normal range. Normal LV EF. Grade 2 diastolic   dysfunction. Mild MR, moderate TR.  ------------------------------------------------------------------- Labs, prior tests, procedures, and surgery: Status post Aortic valve replacement.  ------------------------------------------------------------------- Study data:   Study status:  Routine.  Procedure:  The patient reported no pain pre or post test. Transthoracic echocardiography for left ventricular function evaluation, for right ventricular function evaluation, and for assessment of valvular function. Image quality was adequate.  Study completion:   There were no complications.          Transthoracic echocardiography.  M-mode, complete 2D, spectral Doppler, and color Doppler.  Birthdate: Patient birthdate: [birth year].  Age:  Patient is 84 yr old.  Sex: Gender: male.    BMI: 17.9 kg/m^2.  Blood pressure:     118/62 Patient status:  Outpatient.  Study date:  Study date: 09/11/2018. Study time: 10:47 AM.  Location:  [place] [facility]  -------------------------------------------------------------------  ------------------------------------------------------------------- Left ventricle:  The cavity size was normal. Systolic function was normal. Wall motion was normal; there were no regional wall motion abnormalities. Features are consistent with a pseudonormal left ventricular filling pattern, with concomitant abnormal relaxation and increased filling pressure (grade 2 diastolic dysfunction).  ------------------------------------------------------------------- Aortic valve:   Trileaflet; mildly thickened leaflets. Mobility was not restricted.  Doppler:  Transvalvular velocity was within the normal range. There was mild regurgitation.    VTI ratio of LVOT to aortic valve: 0.41. Valve area (VTI): 1.29 cm^2. Indexed valve area (VTI): 0.91 cm^2/m^2. Peak velocity ratio of LVOT to aortic valve: 0.37. Valve area (Vmax): 1.16 cm^2. Indexed valve area (Vmax): 0.82 cm^2/m^2. Mean velocity ratio of LVOT to aortic valve: 0.38. Valve area (Vmean): 1.19 cm^2. Indexed valve area (Vmean): 0.84 cm^2/m^2.    Mean gradient (S): 10 mm Hg. Peak gradient (S): 21 mm Hg.  ------------------------------------------------------------------- Aorta:  Aortic root: The aortic root was normal in size.  ------------------------------------------------------------------- Mitral valve:   Calcified annulus. Moderately thickened, moderately calcified leaflets . Mobility was not restricted.  Doppler: Transvalvular velocity was within the normal range. There was  no evidence for stenosis. There was mild regurgitation.    Peak gradient (D): 2 mm Hg.  ------------------------------------------------------------------- Left atrium:  The atrium was mildly dilated.  ------------------------------------------------------------------- Atrial septum:  No defect or patent foramen ovale was identified. There was redundancy of the septum, with borderline criteria for aneurysm.  ------------------------------------------------------------------- Right ventricle:  The cavity size was normal. Wall thickness was normal. Systolic function was normal.  ------------------------------------------------------------------- Pulmonic valve:   Poorly visualized.  The valve appears to be grossly normal.    Doppler:  Transvalvular velocity was within the normal range. There was no evidence for stenosis. There was no significant regurgitation.  ------------------------------------------------------------------- Tricuspid valve:   Structurally normal valve.    Doppler: Transvalvular velocity was within the normal range. There was moderate regurgitation.  ------------------------------------------------------------------- Pulmonary artery:   The main pulmonary artery was normal-sized. Systolic pressure was within the normal range.  ------------------------------------------------------------------- Right atrium:  The atrium was mildly dilated.  ------------------------------------------------------------------- Pericardium:  A trivial pericardial effusion was identified posterior to the heart.  ------------------------------------------------------------------- Systemic veins: Inferior vena cava: The vessel was normal in size.  ------------------------------------------------------------------- Measurements   Left ventricle                           Value          Reference  LV ID, ED, PLAX chordal  43    mm       43 - 52  LV ID,  ES, PLAX chordal                  26    mm       23 - 38  LV fx shortening, PLAX chordal           40    %        >=29  LV PW thickness, ED                      9     mm       ----------  IVS/LV PW ratio, ED                      1              <=1.3  Stroke volume, 2D                        58    ml       ----------  Stroke volume/bsa, 2D                    41    ml/m^2   ----------  LV e&', lateral                           12    cm/s     ----------  LV E/e&', lateral                         6.43           ----------  LV e&', medial                            6.42  cm/s     ----------  LV E/e&', medial                          12.01          ----------  LV e&', average                           9.21  cm/s     ----------  LV E/e&', average                         8.37           ----------    Ventricular septum                       Value          Reference  IVS thickness, ED                        9     mm       ----------    LVOT                                     Value          Reference  LVOT ID,  S                               20    mm       ----------  LVOT area                                3.14  cm^2     ----------  LVOT ID                                  20    mm       ----------  LVOT peak velocity, S                    84.2  cm/s     ----------  LVOT mean velocity, S                    55.3  cm/s     ----------  LVOT VTI, S                              18.5  cm       ----------  Stroke volume (SV), LVOT DP              58.1  ml       ----------  Stroke index (SV/bsa), LVOT DP           41    ml/m^2   ----------    Aortic valve                             Value          Reference  Aortic valve peak velocity, S            228   cm/s     ----------  Aortic valve mean velocity, S            146   cm/s     ----------  Aortic valve VTI, S                      45    cm       ----------  Aortic mean gradient, S                  10    mm Hg    ----------  Aortic peak gradient, S                   21    mm Hg    ----------  VTI ratio, LVOT/AV                       0.41           ----------  Aortic valve area, VTI                   1.29  cm^2     ----------  Aortic valve area/bsa, VTI               0.91  cm^2/m^2 ----------  Velocity ratio, peak, LVOT/AV  0.37           ----------  Aortic valve area, peak velocity         1.16  cm^2     ----------  Aortic valve area/bsa, peak              0.82  cm^2/m^2 ----------  velocity  Velocity ratio, mean, LVOT/AV            0.38           ----------  Aortic valve area, mean velocity         1.19  cm^2     ----------  Aortic valve area/bsa, mean              0.84  cm^2/m^2 ----------  velocity  Aortic regurg pressure half-time         724   ms       ----------    Aorta                                    Value          Reference  Aortic root ID, ED                       32    mm       ----------  Ascending aorta ID, A-P, S               35    mm       ----------    Left atrium                              Value          Reference  LA ID, A-P, ES                           43    mm       ----------  LA ID/bsa, A-P                   (H)     3.03  cm/m^2   <=2.2  LA volume, S                             47.2  ml       ----------  LA volume/bsa, S                         33.3  ml/m^2   ----------  LA volume, ES, 1-p A4C                   40.8  ml       ----------  LA volume/bsa, ES, 1-p A4C               28.8  ml/m^2   ----------  LA volume, ES, 1-p A2C                   54.2  ml       ----------  LA volume/bsa, ES, 1-p A2C               38.2  ml/m^2   ----------    Mitral valve  Value          Reference  Mitral E-wave peak velocity              77.1  cm/s     ----------  Mitral A-wave peak velocity              75.2  cm/s     ----------  Mitral deceleration time         (H)     313   ms       150 - 230  Mitral peak gradient, D                  2     mm Hg    ----------  Mitral E/A ratio, peak                    1              ----------    Pulmonary arteries                       Value          Reference  PA pressure, S, DP               (H)     32    mm Hg    <=30    Tricuspid valve                          Value          Reference  Tricuspid regurg peak velocity           270   cm/s     ----------  Tricuspid peak RV-RA gradient            29    mm Hg    ----------    Right atrium                             Value          Reference  RA ID, S-I, ES, A4C                      46    mm       34 - 49  RA area, ES, A4C                         18.1  cm^2     8.3 - 19.5  RA volume, ES, A/L                       56    ml       ----------  RA volume/bsa, ES, A/L                   39.5  ml/m^2   ----------    Systemic veins                           Value          Reference  Estimated CVP                            3     mm Hg    ----------  Right ventricle                          Value          Reference  RV ID, minor axis, ED, A4C base          33    mm       ----------  TAPSE                                    20.4  mm       ----------  RV pressure, S, DP               (H)     32    mm Hg    <=30  RV s&', lateral, S                        14.4  cm/s     ----------  Legend: (L)  and  (H)  mark values outside specified reference range.  ------------------------------------------------------------------- Prepared and Electronically Authenticated by  Buford Dresser 2020-01-08T14:12:17      ECHOCARDIOGRAM REPORT       Patient Name:   [name] Date of Exam: 03/18/2019 Medical Rec #:  [id number]       Height:       63.0 in Accession #:    [id number]      Weight:       109.1 lb Date of Birth:  1935/08/16       BSA:          1.49 m Patient Age:    29 years        BP:           102/46 mmHg Patient Gender: M               HR:           63 bpm. Exam Location:  [facility]    Procedure: 2D Echo, 3D Echo, Cardiac Doppler and Color Doppler  STAT  ECHO  Indications:    R01.1 Murmur   History:        Patient has prior history of Echocardiogram examinations, most                 recent 09/11/2018. CHF CAD Atrial Fibrillation Aortic Valve                 Disease Signs/Symptoms: Shortness of Breath Risk Factors:                 Hypertension, Family History of Coronary Artery Disease and                 Former Smoker. Pre-Operative Eval for Right Shoulder Repair,                 Edema, Aortic Valve Replacement (2009- 25 mm Edwards tissue).   Sonographer:    [place] Referring Phys: [place]    1. The left ventricle has normal systolic function, with an ejection fraction of 55-60%. The cavity size was normal. Left ventricular diastolic Doppler parameters are consistent with restrictive filling. Elevated left atrial and left ventricular  end-diastolic pressures There is right ventricular volume and pressure overload.  2. The right ventricle has mildly reduced systolic function. The cavity was mildly  enlarged. There is no increase in right ventricular wall thickness. Right ventricular systolic pressure is severely elevated with an estimated pressure of 64.2 mmHg.  3. Left atrial size was moderately dilated.  4. Right atrial size was moderately dilated.  5. Mitral valve regurgitation is moderate by color flow Doppler.  6. Tricuspid valve regurgitation is moderate.  7. S/P AVR with a 25 mm Edwards bioprosthetic valve. Aortic valve regurgitation is moderate by color flow Doppler. No stenosis of the aortic valve.  8. The inferior vena cava was dilated in size with >50% respiratory variability.  SUMMARY   Since the last study on 09/11/2018 aortic regurgitation is now moderate, there is on stenosis. There is normal LVEF 55-60% with grade 3 diastolic dysfunction and elevated filling pressures. There is RV pressure and volume overload, moderate pulmonary hypertension.  FINDINGS  Left Ventricle: The  left ventricle has normal systolic function, with an ejection fraction of 55-60%. The cavity size was normal. There is no increase in left ventricular wall thickness. Left ventricular diastolic Doppler parameters are consistent with  restrictive filling. Elevated left atrial and left ventricular end-diastolic pressures There is the interventricular septum is flattened in systole and diastole, consistent with right ventricular pressure and volume overload.  Right Ventricle: The right ventricle has mildly reduced systolic function. The cavity was mildly enlarged. There is no increase in right ventricular wall thickness. Right ventricular systolic pressure is severely elevated with an estimated pressure of  64.2 mmHg.  Left Atrium: Left atrial size was moderately dilated.  Right Atrium: Right atrial size was moderately dilated. Right atrial pressure is estimated at 8 mmHg.  Interatrial Septum: No atrial level shunt detected by color flow Doppler.  Pericardium: The pericardium was not well visualized.  Mitral Valve: The mitral valve is normal in structure. Mitral valve regurgitation is moderate by color flow Doppler.  Tricuspid Valve: The tricuspid valve is normal in structure. Tricuspid valve regurgitation is moderate by color flow Doppler.  Aortic Valve: The aortic valve has been repaired/replaced Aortic valve regurgitation is moderate by color flow Doppler. There is No stenosis of the aortic valve, with a calculated valve area of 1.65 cm.  Pulmonic Valve: The pulmonic valve was normal in structure. Pulmonic valve regurgitation is not visualized by color flow Doppler.  Venous: The inferior vena cava is dilated in size with greater than 50% respiratory variability.    +--------------+--------++  LEFT VENTRICLE            +----------------+----------++ +--------------+--------++  Diastology                     PLAX 2D                    +----------------+----------++ +--------------+--------++  LV e' lateral:   10.50 cm/s    LVIDd:         4.99 cm    +----------------+----------++ +--------------+--------++  LV E/e' lateral: 12.9          LVIDs:         3.71 cm    +----------------+----------++ +--------------+--------++  LV e' medial:    4.56 cm/s     LV PW:         0.99 cm    +----------------+----------++ +--------------+--------++  LV E/e' medial:  29.6          LV IVS:        0.91 cm    +----------------+----------++ +--------------+--------++  LVOT diam:     2.40 cm    +--------------+--------++  LV SV:         59 ml      +--------------+--------++  LV SV Index:   40.01      +--------------+--------++  LVOT Area:     4.52 cm   +--------------+--------++                            +--------------+--------++  +---------------+---------++  RIGHT VENTRICLE             +---------------+---------++  RV S prime:     9.37 cm/s   +---------------+---------++  TAPSE (M-mode): 1.1 cm      +---------------+---------++  +---------------+-------++-----------++  LEFT ATRIUM              Index         +---------------+-------++-----------++  LA diam:        4.80 cm  3.21 cm/m    +---------------+-------++-----------++  LA Vol (A2C):   94.9 ml  63.49 ml/m   +---------------+-------++-----------++  LA Vol (A4C):   65.8 ml  44.02 ml/m   +---------------+-------++-----------++  LA Biplane Vol: 81.0 ml  54.19 ml/m   +---------------+-------++-----------++ +------------+---------++-----------++  RIGHT ATRIUM            Index         +------------+---------++-----------++  RA Area:     24.00 cm                +------------+---------++-----------++  RA Volume:   78.10 ml   52.25 ml/m   +------------+---------++-----------++  +------------------+------------++  AORTIC VALVE                      +------------------+------------++  AV Area (Vmax):    1.40 cm       +------------------+------------++  AV Area  (Vmean):   1.45 cm       +------------------+------------++  AV Area (VTI):     1.65 cm       +------------------+------------++  AV Vmax:           249.00 cm/s    +------------------+------------++  AV Vmean:          165.500 cm/s   +------------------+------------++  AV VTI:            0.566 m        +------------------+------------++  AV Peak Grad:      24.8 mmHg      +------------------+------------++  AV Mean Grad:      13.0 mmHg      +------------------+------------++  LVOT Vmax:         76.90 cm/s     +------------------+------------++  LVOT Vmean:        52.900 cm/s    +------------------+------------++  LVOT VTI:          0.206 m        +------------------+------------++  LVOT/AV VTI ratio: 0.36           +------------------+------------++  AR PHT:            358 msec       +------------------+------------++   +-------------+-------++  AORTA                   +-------------+-------++  Ao Root diam: 3.30 cm   +-------------+-------++  Ao Asc diam:  3.30 cm   +-------------+-------++  +--------------+---------++    +---------------+-----------++  MITRAL VALVE                   TRICUSPID  VALVE               +--------------+---------++    +---------------+-----------++  MV Area (PHT): 3.24 cm        TR Peak grad:   56.2 mmHg     +--------------+---------++    +---------------+-----------++  MV Peak grad:  7.2 mmHg        TR Vmax:        375.00 cm/s   +--------------+---------++    +---------------+-----------++  MV Mean grad:  2.0 mmHg    +--------------+---------++    +--------------+-------+  MV Vmax:       1.34 m/s        SHUNTS                  +--------------+---------++    +--------------+-------+  MV Vmean:      55.1 cm/s       Systemic VTI:  0.21 m   +--------------+---------++    +--------------+-------+  MV VTI:        0.29 m          Systemic Diam: 2.40 cm  +--------------+---------++    +--------------+-------+  MV PHT:        68 msec      +--------------+---------++  MV Decel Time: 187 msec    +--------------+---------++ +---------------+-----------++  MR Peak grad:   100.8 mmHg    +---------------+-----------++  MR Mean grad:   67.0 mmHg     +---------------+-----------++  MR Vmax:        502.00 cm/s   +---------------+-----------++  MR Vmean:       383.0 cm/s    +---------------+-----------++  MR PISA:        1.01 cm      +---------------+-----------++  MR PISA Radius: 0.40 cm       +---------------+-----------++ +--------------+-----------++  MV E velocity: 135.00 cm/s   +--------------+-----------++  MV A velocity: 33.10 cm/s    +--------------+-----------++  MV E/A ratio:  4.08          +--------------+-----------++    Ena Dawley MD Electronically signed by Ena Dawley MD Signature Date/Time: 03/18/2019/7:36:15 PM     TRANSESOPHOGEAL ECHO REPORT       Patient Name:   [name] Date of Exam: 04/02/2019 Medical Rec #:  [id number]       Height:       63.0 in Accession #:    [id number]      Weight:       105.2 lb Date of Birth:  Feb 25, 1935       BSA:          1.47 m Patient Age:    63 years        BP:           120/56 mmHg Patient Gender: M               HR:           54 bpm. Exam Location:  Inpatient    Procedure: Transesophageal Echo, Color Doppler, Cardiac Doppler and 3D Echo  Indications:     Aortic valve disorder 424.1 / I35.9   History:         Patient has prior history of Echocardiogram examinations. CHF                  CAD Risk Factors: Hypertension.   Sonographer:     Darlina Sicilian RDCS Referring Phys:  [place] Diagnosing Phys: Jenkins Rouge MD  PROCEDURE: The transesophogeal probe was passed through the esophogus of the patient. The patient developed no complications during the procedure.  IMPRESSIONS    1. The left ventricle has normal systolic function, with an ejection fraction of 60-65%. The cavity size was mildly dilated. Left  ventricular diastolic function could not be evaluated.  2. The right ventricle has normal systolc function. The cavity was normal. There is no increase in right ventricular wall thickness.  3. No evidence of a thrombus present in the left atrial appendage.  4. Aortic valve regurgitation is severe by color flow Doppler. Mild stenosis of the aortic valve.  5. No vegetation on the aortic valve.  6. 25 mm Edwards stented bioprosthetic valve. Leaflets thickened and calcified Sever AR tha appears central and not peri valvular. Suggest aortic root injection at cath and TAVR CTA to further assess possible valve in valve Rx.  7. Pulmonic valve regurgitation is mild by color flow Doppler.  8. The aortic root is normal in size and structure.  9. Extensive use of 3D imaging done to assess AVR.  FINDINGS  Left Ventricle: The left ventricle has normal systolic function, with an ejection fraction of 60-65%. The cavity size was mildly dilated. There is no increase in left ventricular wall thickness. Left ventricular diastolic function could not be  evaluated.  Right Ventricle: The right ventricle has normal systolic function. The cavity was normal. There is no increase in right ventricular wall thickness.  Left Atrial Appendage: No evidence of a thrombus present in the left atrial appendage.  Right Atrium: Right atrial size was normal in size. Right atrial pressure is estimated at 10 mmHg.  Pericardium: There is no evidence of pericardial effusion.  Mitral Valve: The mitral valve is normal in structure. Mitral valve regurgitation is not visualized by color flow Doppler.  Tricuspid Valve: The tricuspid valve was normal in structure. Tricuspid valve regurgitation is mild by color flow Doppler.  Aortic Valve: The aortic valve has been repaired/replaced Aortic valve regurgitation is severe by color flow Doppler. There is Mild stenosis of the aortic valve. There is no evidence of a vegetation on the  aortic valve. 25 mm Edwards stented  bioprosthetic valve. Leaflets thickened and calcified Sever AR tha appears central and not peri valvular. Suggest aortic root injection at cath and TAVR CTA to further assess possible valve in valve Rx.  Pulmonic Valve: The pulmonic valve was normal in structure. Pulmonic valve regurgitation is mild by color flow Doppler.  Aorta: The aortic root is normal in size and structure.  Additional Findings: Extensive use of 3D imaging done to assess AVR.    +-------------+------------++  AORTIC VALVE                 +-------------+------------++  AV Vmax:      203.00 cm/s    +-------------+------------++  AV Vmean:     131.000 cm/s   +-------------+------------++  AV VTI:       0.476 m        +-------------+------------++  AV Peak Grad: 16.5 mmHg      +-------------+------------++  AV Mean Grad: 8.0 mmHg       +-------------+------------++  AR PHT:       342 msec       +-------------+------------++    Jenkins Rouge MD Electronically signed by Jenkins Rouge MD Signature Date/Time: 04/02/2019/11:33:52 AM  MODIFIED REPORT This report was modified by Jenkins Rouge MD on 04/02/2019 due to billing.  Extensive use of 3D imaging done to visualize the AVR  [id number] Electronically Amended 04/02/2019, 11:34 AM  Extensive use of 3D imaging used to evaluate the patients AVR  [id number] Electronically Amended 04/02/2019, 11:37 AM        RIGHT/LEFT HEART CATH AND CORONARY ANGIOGRAPHY  Conclusion    Angiographically minimal CAD  The left ventricular systolic function is normal -as assessed by aortic root angiogram  There is severe (4+) aortic regurgitation.  LV end diastolic pressure is moderately elevated.   SUMMARY  Angiographically normal coronary arteries with a right dominant system.  Severe aortic insufficiency seen on aortic root angiogram  Mild to moderate elevated LVEDP and PCWP  RECOMMENDATIONS  We  will place the patient in extended recovery/observation status to allow for monitoring of hemoglobin post procedure as well as to obtain consultations from the cardiac structural team and potentially hematology to assess his ongoing anemia.  Anticipate that he should be able to home tomorrow, but would consider checking Cardiac CTA for pre-TAVR prior to discharge.  Otherwise continue home meds.     Glenetta Hew, M.D., M.S. Interventional Cardiologist   Pager # [phone] Phone # [phone] [address] [place], [place] [address]    Recommendations  Antiplatelet/Anticoag No indication for antiplatelet therapy at this time .  Discharge Date In the absence of any other complications or medical issues, we expect the patient to be ready for discharge from a cath perspective on 04/03/2019.  Surgeon Notes    04/02/2019 11:08 AM CV Procedure signed by Josue Hector, MD  Indications  Severe aortic regurgitation [I35.1 (ICD-10-CM)]  S/P AVR (aortic valve replacement) [F79.0 (ICD-10-CM)]  Procedural Details  Technical Details PCP: Dr. Lavone Orn Cardiologist: Dr. [name]  Time Out: Verified patient identification, verified procedure, site/side was marked, verified correct patient position, special equipment/implants available, medications/allergies/relevent history reviewed, required imaging and test results available. Performed.  Access:  RIGHT Radial Artery: 6 Fr sheath -- Seldinger technique using Angiocath Micropuncture Kit -- Direct ultrasound guidance used.  Permanent image obtained and placed on chart. -- 10 mL radial cocktail IA; 2000 Units IV Heparin  * Right Brachial/Antecubital Vein: The existing 18-gauge IV was exchanged over a wire for a 5Fr GLIDE sheath  Right Heart Catheterization: 5 Fr Gordy Councilman catheter advanced under fluoroscopy with balloon inflated to the RA, RV, then PCWP-PA for hemodynamic measurement.  * Simultaneous FA & PA blood  gases checked for SaO2% to calculate FICK CO/CI.   -- A glide wire was used to assist advancement of the catheter from the shoulder to the RV and then from the artery into the PA. * After all pressures and saturations were obtained, the catheter removed completely out of the body with balloon deflated.  Left Heart Catheterization: 5 Fr Catheters advanced or exchanged over a J-wire under direct fluoroscopic guidance into the ascending aorta; TIG 4.0 catheter advanced first.  * LV Hemodynamics (Aortic Root Angiogram): Angled pigtail catheter * Left Coronary Artery Cineangiography: TIG 4.0 catheter  * Right Coronary Artery Cineangiography: JR4 catheter catheter   Upon completion of Angiogaphy, the catheter was removed completely out of the body over a wire, without complication.  Femoral / Brachial Sheath(s) removed in the Cath Lab with manual pressure for hemostasis.   Radial sheath removed in the Cardiac Catheterization lab with TR Band placed for hemostasis.  TR Band: 1305 Hours; 12 mL air --After  band was placed, there was a hematoma proximal to the band.  5 minutes of manual pressure held with no further hematoma or bleeding.  MEDICATIONS * SQ Lidocaine 67m * Radial Cocktail: 3 mg Verapmil in 10 mL NS * Isovue Contrast: 80 mL * Heparin: 2000 units  Fluoro time: 15.1 minutes. Dose Area Product: 9729 mGycm2. Cumulative Air Kerma: 151 mGy.    Estimated blood loss <50 mL.   During this procedure medications were administered to achieve and maintain moderate conscious sedation while the patient's heart rate, blood pressure, and oxygen saturation were continuously monitored and I was present face-to-face 100% of this time.  Medications (Filter: Administrations occurring from 04/02/19 1142 to 04/02/19 1328) (important)  Continuous medications are totaled by the amount administered until 04/02/19 1328.  Medication Rate/Dose/Volume Action  Date Time   lidocaine (PF) (XYLOCAINE) 1 %  injection (mL) 2 mL Given 04/02/19 1211   Total dose as of 04/02/19 1328 2 mL Given 1212   4 mL        Heparin (Porcine) in NaCl 1000-0.9 UT/500ML-% SOLN (mL) 500 mL Given 04/02/19 1211   Total dose as of 04/02/19 1328 500 mL Given 1211   1,000 mL        Radial Cocktail/Verapamil only (mL) 10 mL Given 04/02/19 1215   Total dose as of 04/02/19 1328        10 mL        heparin injection (Units) 2,000 Units Given 04/02/19 1232   Total dose as of 04/02/19 1328        2,000 Units        iohexol (OMNIPAQUE) 350 MG/ML injection (mL) 80 mL Given 04/02/19 1309   Total dose as of 04/02/19 1328        80 mL        Complications  Complications documented before study signed (04/02/2019 1:43 PM)   RIGHT/LEFT HEART CATH AND CORONARY ANGIOGRAPHY  Bleeding at access site Documented by HLeonie Man MD 04/02/2019 1:27 PM  Date Found: 04/02/2019  Outcome: Resolved in Lab  Time Range: Intraprocedure  Comment: Manual pressure held for small hematoma      Coronary Findings  Diagnostic Dominance: Co-dominant Left Anterior Descending  First Diagonal Branch  Vessel is small in size.  First Septal Branch  Vessel is small in size.  Second Diagonal Branch  Vessel is moderate in size  Right Coronary Artery  Acute Marginal Branch  Vessel is small in size.  Right Posterior Descending Artery  Vessel is actually moderate in size The vessel is tortuous.  Second Right Posterolateral Branch  Moderate size vessel  Intervention  No interventions have been documented. Right Heart  Right Heart Pressures PA P-mean: 39/13 mmHg - 24 mmHg (borderline. PCWP 22/34 mmHg - 21 mmHg with very large V wave Elevated LV EDP consistent with volume overload. LV P-EDP 112/7 mmHg - 19 mmHg AOP-MAP 112/40 mmHg - 66 mmHg Ao sat 96%, PA sat 55% CARDIAC OUTPUT-INDEX (Fick): 4.96-3.36  Right Atrium The right atrial size is at the upper limits of normal. Right atrial pressure is elevated. RAP 10 mmHg  Right Ventricle  The right ventricle is mildly dilated. RVP-EDP 45/2 mmHg - 12 mmHg  Wall Motion  Resting       LV gram was not performed, however, supravalvular aortogram opacified the LV enough to assess wall motion. fill the ventricle          Left Heart  Left Ventricle The left ventricular size is  in the upper limits of normal. The left ventricular systolic function is normal. LV end diastolic pressure is moderately elevated.  Aortic Valve There is severe (4+) aortic regurgitation. The patient has a bioprosthetic aortic valve that functions abnormally. Supravalvular aortogram almost fully opacified the LV.  Coronary Diagrams  Diagnostic Dominance: Co-dominant  Intervention  Implants   No implant documentation for this case.  Syngo Images  Show images for CARDIAC CATHETERIZATION  Images on Long Term Storage  Show images for [place], Maeson Purohit "Ben"   Link to Procedure Log  Procedure Log    Hemo Data   Most Recent Value  Fick Cardiac Output 4.96 L/min  Fick Cardiac Output Index 3.36 (L/min)/BSA  RA A Wave 12 mmHg  RA V Wave 13 mmHg  RA Mean 10 mmHg  RV Systolic Pressure 45 mmHg  RV Diastolic Pressure 2 mmHg  RV EDP 12 mmHg  PA Systolic Pressure 39 mmHg  PA Diastolic Pressure 13 mmHg  PA Mean 24 mmHg  PW A Wave 22 mmHg  PW V Wave 34 mmHg  PW Mean 17 mmHg  AO Systolic Pressure 830 mmHg  AO Diastolic Pressure 36 mmHg  AO Mean 60 mmHg  LV Systolic Pressure 940 mmHg  LV Diastolic Pressure 8 mmHg  LV EDP 17 mmHg  AOp Systolic Pressure 768 mmHg  AOp Diastolic Pressure 40 mmHg  AOp Mean Pressure 66 mmHg  LVp Systolic Pressure 088 mmHg  LVp Diastolic Pressure 7 mmHg  LVp EDP Pressure 21 mmHg  QP/QS 1  TPVR Index 7.14 HRUI  TSVR Index 17.85 HRUI  PVR SVR Ratio 0.14  TPVR/TSVR Ratio 0.4     STS Risk Calculator  Procedure: Isolated redo AVR   Risk of Mortality: 10.444%  Renal Failure: 5.449%  Permanent Stroke: 3.218%  Prolonged Ventilation: 25.063%  DSW Infection:  0.145%  Reoperation: 12.999%  Morbidity or Mortality: 37.026%  Short Length of Stay: 10.666%  Long Length of Stay: 24.054%    Impression:  Patient has prosthetic valve dysfunction involving bioprosthetic tissue valve placed in the aortic position in 2009 and presents with acute diastolic congestive heart failure, [employer] [place] Heart Association functional class IIIb.  I have personally reviewed the patient's recent transthoracic and transesophageal echocardiograms, diagnostic cardiac catheterization, and CT angiograms.  There is severe aortic insufficiency of the patient's bioprosthetic valve in the aortic position.  There is no sign of bacterial endocarditis.  There is normal left ventricular systolic function with mild left ventricular chamber enlargement.  Diagnostic cardiac catheterization is notable for the absence of significant coronary artery disease.  There is no question the patient needs redo aortic valve replacement, and definitive treatment should be sooner rather than later.  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  The patient's original tissue valve was 25 mm diameter in the appears to be adequate height of the origin of both the left main and the right coronary arteries as well as adequate diameter of the sinuses of Valsalva.  CTA of the aorta and iliac vessels demonstrate what appears to be adequate pelvic vascular access to facilitate a transfemoral approach.  Finally, I suspect the patient's recent onset of anemia may be related to hemolysis.    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  Alternative approaches such as  conventional surgical redo aortic valve replacement, valve-in-valve transcatheter aortic valve replacement, and continued medical therapy without intervention were compared and contrasted at length.  The risks associated with conventional surgical redo aortic valve replacement were discussed in detail, as were expectations for post-operative convalescence, and why I would be reluctant to consider this patient a candidate for conventional surgery.  Issues specific to valve-in-valve transcatheter aortic valve replacement were discussed including questions about long term valve durability, the potential for valve thrombosis or paravalvular leak, possible need for permanent pacemaker placement, and other technical complications related to the procedure itself.  Long-term prognosis with medical therapy was discussed. This discussion was placed in the context of the patient's own specific clinical presentation and past medical history.  All of their questions have been addressed.  The patient desires to proceed with valve in valve transcatheter aortic valve replacement soon as practical.  We tentatively plan for surgery on April 15, 2019.  Following the decision to proceed with transcatheter aortic valve replacement, a discussion has been held regarding what types of management strategies would be attempted intraoperatively in the event of life-threatening complications, including whether or not the patient would be considered a candidate for the use of cardiopulmonary bypass and/or conversion to open sternotomy for attempted surgical intervention.  The patient has been advised of a variety of complications that might develop including but not limited to risks of death, stroke, paravalvular leak, aortic dissection or other major vascular complications, aortic annulus rupture, device embolization, cardiac rupture or perforation, mitral regurgitation, acute myocardial infarction, arrhythmia, heart block or bradycardia  requiring permanent pacemaker placement, congestive heart failure, respiratory failure, renal failure, pneumonia, infection, other late complications related to structural valve deterioration or migration, or other complications that might ultimately cause a temporary or permanent loss of functional independence or other long term morbidity.  The patient provides full informed consent for the procedure as described and all questions were answered.    I spent in excess of 90 minutes during the conduct of this office consultation and >50% of this time involved direct face-to-face encounter with the patient for counseling and/or coordination of their care.      Valentina Gu. Roxy Manns, MD 04/10/2019 2:54 PM

## 2019-04-10 NOTE — Progress Notes (Signed)
[facility] [place] [facility] CONSULTATION REPORT  Referring Provider is Leonie Man, MD Primary Cardiologist is [name], MD PCP is Lavone Orn, MD  Chief Complaint  Patient presents with   Aortic Stenosis    TAVR consultation, review all studies    HPI:  Patient is an 84 year old male status post aortic valve replacement using a bioprosthetic tissue valve in 2009 for severe aortic stenosis who has been referred for surgical consultation to discuss treatment options for management of prosthetic valve dysfunction with severe aortic insufficiency and acute diastolic congestive heart failure.  Patient underwent aortic valve replacement using a 25 mm Edwards magna stented bovine pericardial tissue valve by Dr. Servando Snare in 2009 for severe symptomatic aortic stenosis.  His early postoperative recovery was notable for postoperative atrial fibrillation which has not recurred.  He has done well from a cardiac standpoint and been followed carefully by Dr. Radford Pax.  Echocardiogram performed in January of this year revealed normal left ventricular systolic function and mild aortic insufficiency.  Transvalvular gradients across the bioprosthetic tissue valve were normal at that time.  Over the past 2 to 3 months the patient has developed relatively acute onset of shortness of breath, chest discomfort, orthopnea, and lower extremity edema.  Initially symptoms were primarily with physical exertion but symptoms have progressed fairly rapidly such that now the patient gets short of breath with minimal activity and occasionally at rest.  He cannot lay flat in bed.  He has lower extremity edema.  He has had some tightness across his chest.  He has also had some dizzy spells with near syncope.  He was seen in follow-up by Dr. Radford Pax and transthoracic echocardiogram performed March 18, 2019 revealed normal left ventricular systolic function and at  least moderate aortic insufficiency.  The patient subsequently underwent transesophageal echocardiogram and diagnostic cardiac catheterization on April 02, 2019.  TEE confirmed the presence of severe prosthetic valve dysfunction with severe aortic insufficiency.  There was no vegetation on the aortic valve and no other findings to suggest the presence of endocarditis.  Left ventricular systolic function was normal.  There is mild left ventricular chamber enlargement.  Diagnostic cardiac catheterization revealed normal coronary arteries with no significant coronary artery disease.  There was severe aortic insufficiency on room.  There is moderately elevated left ventricular end-diastolic pressure and pulmonary capillary wedge pressure consistent with acute diastolic congestive heart failure.  CT angiography was performed and the patient referred for surgical consultation.  Patient is married and lives locally in [place] with his wife.  His wife suffers from dementia.  He is accompanied by his daughter for his office consultation today.  He has been retired since 1997 having previously worked at [employer].  He has remained physically active throughout retirement and has been quite active physically until recently.  He has had problems with his right shoulder and underwent right shoulder replacement more than 1 year ago.  He has had multiple problems ever since with an attempt at revision of his shoulder arthroplasty which subsequently became infected, requiring removal of the hardware and placement of temporary plastic hardware.  He has been evaluated for redo shoulder replacement in the future.  He otherwise has no physical limitations.  His ambulation is normal and prior to the development of heart failure over the last few months he was quite active physically.  He specifically denies any history of fevers, chills, or weight loss.  He has  decreased energy.  He denies any history of  hematochezia, hematemesis, or melena.  He has recently been found to be anemic, and all of this has developed since he developed symptoms of acute heart failure.  He is chronically immunosuppressed having been on systemic steroids using prednisone for many years because of rheumatoid arthritis.  He is now also taking methotrexate.  Past Medical History:  Diagnosis Date   Anemia    Previous history of anemia   Aortic insufficiency 03/24/2019   AI of bioprosthetic AVR   Chronic diastolic CHF (congestive heart failure) ([place]) 03/24/2019   Colon polyps    s/p diverticular perforation requiring 2-stage repair   Diverticulosis    DJD (degenerative joint disease), lumbosacral    Esophageal stricture    GERD (gastroesophageal reflux disease)    History of blood transfusion    patient states "years ago"   History of kidney stones    passed stones   Hypertension    Mitral regurgitation    moderate   Occipital neuralgia    Postoperative atrial fibrillation ([place]) 05/10/2015   Prosthetic valve dysfunction    Rheumatoid arthritis (HCC)    s/o long term steroids  shoulders and hands   Rotator cuff arthropathy    right   S/P aortic valve replacement with bioprosthetic valve 01/16/2008   52m Edwards Magna perimount bovine pericardial tissue valve, model 3000   SBE (subacute bacterial endocarditis) prophylaxis candidate    for dental procedures   Severe aortic stenosis    S/P prosthetic valve replacement w 25 mm Edwards like science percardial tissue valve,[place] - 01/2008    Past Surgical History:  Procedure Laterality Date   APPENDECTOMY     BOTOX INJECTION N/A 01/22/2018   Procedure: BOTOX INJECTION;  Surgeon: KRonnette Juniper MD;  Location: [facility];  Service: Gastroenterology;  Laterality: N/A;   CARDIAC CATHETERIZATION     09   CARDIAC VALVE REPLACEMENT  01/2008   aortic valve replacement   CATARACT EXTRACTION W/ INTRAOCULAR LENS  IMPLANT, BILATERAL     COLON  RESECTION     diverticulitis    dental implants     permanent   ESOPHAGEAL MANOMETRY N/A 11/07/2017   Procedure: ESOPHAGEAL MANOMETRY (EM);  Surgeon: KRonnette Juniper MD;  Location: [facility];  Service: Gastroenterology;  Laterality: N/A;   ESOPHAGOGASTRODUODENOSCOPY (EGD) WITH PROPOFOL N/A 01/22/2018   Procedure: ESOPHAGOGASTRODUODENOSCOPY (EGD) WITH PROPOFOL;  Surgeon: KRonnette Juniper MD;  Location: [facility];  Service: Gastroenterology;  Laterality: N/A;   EXCISIONAL TOTAL SHOULDER ARTHROPLASTY WITH ANTIBIOTIC SPACER Right 12/31/2018   Procedure: EXCISIONAL TOTAL SHOULDER ARTHROPLASTY WITH ANTIBIOTIC SPACER;  Surgeon: NNetta Cedars MD;  [facility]  Service: Orthopedics;  Laterality: Right;   HERNIA REPAIR     IRRIGATION AND DEBRIDEMENT SHOULDER Right 11/20/2018    IRRIGATION AND DEBRIDEMENT SHOULDER WITH POLY EXCHANGE (Right Shoulder)   IRRIGATION AND DEBRIDEMENT SHOULDER Right 11/20/2018   Procedure: IRRIGATION AND DEBRIDEMENT SHOULDER WITH POLY EXCHANGE;  Surgeon: NNetta Cedars MD;  [facility]  Service: Orthopedics;  Laterality: Right;   LUMBAR LAMINECTOMY     x 2   REVERSE SHOULDER ARTHROPLASTY Right 03/01/2018   Procedure: RIGHT REVERSE SHOULDER ARTHROPLASTY;  Surgeon: NNetta Cedars MD;  Location: [facility]  Service: Orthopedics;  Laterality: Right;   RIGHT/LEFT HEART CATH AND CORONARY ANGIOGRAPHY N/A 04/02/2019   Procedure: RIGHT/LEFT HEART CATH AND CORONARY ANGIOGRAPHY;  Surgeon: HLeonie Man MD;  [facility];  Service: Cardiovascular;  Laterality: N/A;   SAVORY  DILATION N/A 01/22/2018   Procedure: SAVORY DILATION;  Surgeon: Ronnette Juniper, MD;  Location: [facility];  Service: Gastroenterology;  Laterality: N/A;   SHOULDER HEMI-ARTHROPLASTY Right 06/14/2018   Procedure: RIGHT  REVERSE TOTAL SHOULDER OPEN POLY EXCHANGE;  Surgeon: Netta Cedars, MD;  Location: [facility];  Service: Orthopedics;  Laterality: Right;   TEE WITHOUT CARDIOVERSION N/A  01/29/2014   Procedure: TRANSESOPHAGEAL ECHOCARDIOGRAM (TEE);  Surgeon: Sueanne Margarita, MD;  Location: [facility] END[facility]COPY;  Service: Cardiovascular;  Laterality: N/A;   TEE WITHOUT CARDIOVERSION N/A 04/02/2019   Procedure: TRANSESOPHAGEAL ECHOCARDIOGRAM (TEE);  Surgeon: Josue Hector, MD;  Location: [facility] ENDOSCOPY;  Service: Cardiovascular;  Laterality: N/A;   TONSILLECTOMY      Family History  Problem Relation Age of Onset   Other Mother        NO HEALTH PROBLEMS   Heart disease Father    Arthritis Father    Heart Problems Sister        RELATED TO A MVA   Suicidality Brother    Other Sister        3 SISTERS IN GOOD HEALTH   Other Brother        2 BROTHERS IN GOOD HEALTH   Other Daughter        2 IN GOOD HEALTH    Social History   Socioeconomic History   Marital status: Married    Spouse name: Not on file   Number of children: 2   Years of education: Not on file   Highest education level: Not on file  Occupational History   Occupation: Retired Market researcher at [employer]: Not on file   Food insecurity    Worry: Not on file    Inability: Not on file   Transportation needs    Medical: Not on file    Non-medical: Not on file  Tobacco Use   Smoking status: Former Smoker    Packs/day: 0.50    Years: 10.00    Pack years: 5.00    Types: Cigarettes    Start date: 01/16/1974    Quit date: 09/05/1983    Years since quitting: 35.6   Smokeless tobacco: Former Systems developer    Types: Chew  Substance and Sexual Activity   Alcohol use: Not Currently    Alcohol/week: 0.0 standard drinks   Drug use: No   Sexual activity: Not on file  Lifestyle   Physical activity    Days per week: Not on file    Minutes per session: Not on file   Stress: Not on file  Relationships   Social connections    Talks on phone: Not on file    Gets together: Not on file    Attends religious service: Not on file    Active member of club or organization: Not on  file    Attends meetings of clubs or organizations: Not on file    Relationship status: Not on file   Intimate partner violence    Fear of current or ex partner: Not on file    Emotionally abused: Not on file    Physically abused: Not on file    Forced sexual activity: Not on file  Other Topics Concern   Not on file  Social History Narrative   Not on file    Current Outpatient Medications  Medication Sig Dispense Refill   acetaminophen (TYLENOL) 500 MG tablet Take 1,000 mg by mouth every 6 (six) hours as needed for moderate  pain or headache.     aspirin EC 81 MG EC tablet Take 1 tablet (81 mg total) by mouth daily. 30 tablet    cholecalciferol (VITAMIN D3) 25 MCG (1000 UT) tablet Take 1,000 Units by mouth daily.     folic acid (FOLVITE) 1 MG tablet Take 1 mg by mouth daily.     furosemide (LASIX) 40 MG tablet Take 2 tablets(80MG) by mouth once a day for 3 days, then decrease to 1 tablet(40MG) by mouth once a day (Patient taking differently: Take 40 mg by mouth daily. ) 90 tablet 3   leflunomide (ARAVA) 20 MG tablet Take 20 mg by mouth daily.     MEGARED OMEGA-3 KRILL OIL PO Take 750 mg by mouth daily.      methotrexate (RHEUMATREX) 2.5 MG tablet Take 15 mg by mouth every Monday. Caution:Chemotherapy. Protect from light.     metoprolol succinate (TOPROL-XL) 25 MG 24 hr tablet Take 1 tablet by mouth once daily (Patient taking differently: Take 25 mg by mouth daily. ) 90 tablet 1   Multiple Vitamin (MULTIVITAMIN WITH MINERALS) TABS tablet Take 1 tablet by mouth daily.     pantoprazole (PROTONIX) 40 MG tablet Take 40 mg by mouth daily before breakfast.      potassium chloride SA (K-DUR) 20 MEQ tablet Take 2 tablets(40MEQ) by mouth once a day for 3 days, then decrease to 1 tablet(20MEQ) by mouth once a day. (Patient taking differently: Take 20 mEq by mouth daily. ) 90 tablet 3   predniSONE (DELTASONE) 5 MG tablet Take 5 mg by mouth daily with breakfast.      tamsulosin  (FLOMAX) 0.4 MG CAPS capsule Take 0.8 mg by mouth daily.     THERATEARS 0.25 % SOLN Place 1 drop into both eyes 3 (three) times daily as needed (dry/irritated eyes.).     Turmeric 500 MG CAPS Take 500 mg by mouth daily.     vitamin B-12 (CYANOCOBALAMIN) 1000 MCG tablet Take 1,000 mcg by mouth daily.     oxyCODONE (ROXICODONE) 5 MG immediate release tablet Take 1 tablet (5 mg total) by mouth every 4 (four) hours as needed for severe pain or breakthrough pain. 20 tablet 0   No current facility-administered medications for this visit.     No Known Allergies    Review of Systems:   General:  normal appetite, decreased energy, no weight gain, no weight loss, no fever  Cardiac:  + chest pain with exertion, no chest pain at rest, +SOB with exertion, + resting SOB, + PND, + orthopnea, no palpitations, no arrhythmia, no atrial fibrillation, + LE edema, + dizzy spells, near syncope  Respiratory:  + shortness of breath, no home oxygen, no productive cough, no dry cough, no bronchitis, no wheezing, no hemoptysis, no asthma, no pain with inspiration or cough, no sleep apnea, no CPAP at night  GI:   no difficulty swallowing, no reflux, no frequent heartburn, no hiatal hernia, no abdominal pain, no constipation, no diarrhea, no hematochezia, no hematemesis, no melena  GU:   no dysuria,  no frequency, no urinary tract infection, no hematuria, + enlarged prostate, + kidney stones, no kidney disease  Vascular:  no pain suggestive of claudication, no pain in feet, no leg cramps, no varicose veins, no DVT, no non-healing foot ulcer  Neuro:   no stroke, no TIA's, no seizures, no headaches, no temporary blindness one eye,  no slurred speech, no peripheral neuropathy, no chronic pain, no instability of gait, no memory/cognitive  dysfunction  Musculoskeletal: + arthritis, + joint swelling, no myalgias, no difficulty walking, normal mobility   Skin:   no rash, no itching, no skin infections, no pressure sores or  ulcerations  Psych:   no anxiety, no depression, no nervousness, no unusual recent stress  Eyes:   no blurry vision, + floaters, no recent vision changes, does not wear glasses or contacts  ENT:   no hearing loss, no loose or painful teeth, no denture  Hematologic:  + easy bruising, + abnormal bleeding, no clotting disorder, no frequent epistaxis  Endocrine:  no diabetes, does not check CBG's at home           Physical Exam:   BP (!) 102/50 (BP Location: Left Arm, Patient Position: Sitting, Cuff Size: Small)    Pulse 72    Temp 97.7 F (36.5 C)    Resp 18    Ht '5\' 3"'  (1.6 m)    Wt 109 lb (49.4 kg)    SpO2 97% Comment: RA   BMI 19.31 kg/m   General:  Thin, elderly, pale-appearing male in NAD  HEENT:  Unremarkable   Neck:   no JVD, no bruits, no adenopathy   Chest:   Few bibasilar inspiratory crackles, symmetrical breath sounds, no wheezes, no rhonchi   CV:   RRR, grade IV/VI holodiastolic murmur heard best at LLSB  Abdomen:  soft, non-tender, no masses   Extremities:  warm, well-perfused, pulses palpable, 2+ bilateral LE edema  Rectal/GU  Deferred  Neuro:   Grossly non-focal and symmetrical throughout  Skin:   Clean and dry, no rashes, no breakdown   Diagnostic Tests:  Transthoracic Echocardiography  Patient:    Brysin, Towery MR #:       [id number] Study Date: 09/11/2018 Gender:     M Age:        59 Height:     160 cm Weight:     45.8 kg BSA:        1.42 m^2 Pt. Status: Room:   Margaretmary Dys [id number]  [id number]    TAVWPVX, [place]  SONOGRAPHER  Cindy Hazy, RDCS  PERFORMING   [facility], Outpatient  ATTENDING    Buford Dresser  cc:  -------------------------------------------------------------------  ------------------------------------------------------------------- Indications:      R60.9 Peripheral edema.  ------------------------------------------------------------------- History:   PMH:  Acquired from the patient and from the  patient&'s chart.  PMH:  Coronary artery disease. Pneumonia. Anemia. Postoperative atrial fibrillation. Rheumatoid Arthritis.  ------------------------------------------------------------------- Study Conclusions  - Left ventricle: The cavity size was normal. Systolic function was   normal. Wall motion was normal; there were no regional wall   motion abnormalities. Features are consistent with a pseudonormal   left ventricular filling pattern, with concomitant abnormal   relaxation and increased filling pressure (grade 2 diastolic   dysfunction). - Aortic valve: There was mild regurgitation. Mean gradient (S): 10   mm Hg. - Mitral valve: Calcified annulus. Moderately thickened, moderately   calcified leaflets . There was mild regurgitation. - Left atrium: The atrium was mildly dilated. - Right atrium: The atrium was mildly dilated. - Atrial septum: No defect or patent foramen ovale was identified.   There was redundancy of the septum, with borderline criteria for   aneurysm. - Tricuspid valve: There was moderate regurgitation. - Pulmonary arteries: PA peak pressure: 32 mm Hg (S). - Pericardium, extracardiac: A trivial pericardial effusion was   identified posterior to the heart.  Impressions:  - S/P prior  AVR. AV leaflets moderately thickened/calcified, but   gradients within normal range. Normal LV EF. Grade 2 diastolic   dysfunction. Mild MR, moderate TR.  ------------------------------------------------------------------- Labs, prior tests, procedures, and surgery: Status post Aortic valve replacement.  ------------------------------------------------------------------- Study data:   Study status:  Routine.  Procedure:  The patient reported no pain pre or post test. Transthoracic echocardiography for left ventricular function evaluation, for right ventricular function evaluation, and for assessment of valvular function. Image quality was adequate.  Study completion:   There were no complications.          Transthoracic echocardiography.  M-mode, complete 2D, spectral Doppler, and color Doppler.  Birthdate: Patient birthdate: 1934-10-18.  Age:  Patient is 84 yr old.  Sex: Gender: male.    BMI: 17.9 kg/m^2.  Blood pressure:     118/62 Patient status:  Outpatient.  Study date:  Study date: 09/11/2018. Study time: 10:47 AM.  Location:  [facility]  -------------------------------------------------------------------  ------------------------------------------------------------------- Left ventricle:  The cavity size was normal. Systolic function was normal. Wall motion was normal; there were no regional wall motion abnormalities. Features are consistent with a pseudonormal left ventricular filling pattern, with concomitant abnormal relaxation and increased filling pressure (grade 2 diastolic dysfunction).  ------------------------------------------------------------------- Aortic valve:   Trileaflet; mildly thickened leaflets. Mobility was not restricted.  Doppler:  Transvalvular velocity was within the normal range. There was mild regurgitation.    VTI ratio of LVOT to aortic valve: 0.41. Valve area (VTI): 1.29 cm^2. Indexed valve area (VTI): 0.91 cm^2/m^2. Peak velocity ratio of LVOT to aortic valve: 0.37. Valve area (Vmax): 1.16 cm^2. Indexed valve area (Vmax): 0.82 cm^2/m^2. Mean velocity ratio of LVOT to aortic valve: 0.38. Valve area (Vmean): 1.19 cm^2. Indexed valve area (Vmean): 0.84 cm^2/m^2.    Mean gradient (S): 10 mm Hg. Peak gradient (S): 21 mm Hg.  ------------------------------------------------------------------- Aorta:  Aortic root: The aortic root was normal in size.  ------------------------------------------------------------------- Mitral valve:   Calcified annulus. Moderately thickened, moderately calcified leaflets . Mobility was not restricted.  Doppler: Transvalvular velocity was within the normal range. There was  no evidence for stenosis. There was mild regurgitation.    Peak gradient (D): 2 mm Hg.  ------------------------------------------------------------------- Left atrium:  The atrium was mildly dilated.  ------------------------------------------------------------------- Atrial septum:  No defect or patent foramen ovale was identified. There was redundancy of the septum, with borderline criteria for aneurysm.  ------------------------------------------------------------------- Right ventricle:  The cavity size was normal. Wall thickness was normal. Systolic function was normal.  ------------------------------------------------------------------- Pulmonic valve:   Poorly visualized.  The valve appears to be grossly normal.    Doppler:  Transvalvular velocity was within the normal range. There was no evidence for stenosis. There was no significant regurgitation.  ------------------------------------------------------------------- Tricuspid valve:   Structurally normal valve.    Doppler: Transvalvular velocity was within the normal range. There was moderate regurgitation.  ------------------------------------------------------------------- Pulmonary artery:   The main pulmonary artery was normal-sized. Systolic pressure was within the normal range.  ------------------------------------------------------------------- Right atrium:  The atrium was mildly dilated.  ------------------------------------------------------------------- Pericardium:  A trivial pericardial effusion was identified posterior to the heart.  ------------------------------------------------------------------- Systemic veins: Inferior vena cava: The vessel was normal in size.  ------------------------------------------------------------------- Measurements   Left ventricle                           Value          Reference  LV ID, ED, PLAX chordal  43    mm       43 - 52  LV ID,  ES, PLAX chordal                  26    mm       23 - 38  LV fx shortening, PLAX chordal           40    %        >=29  LV PW thickness, ED                      9     mm       ----------  IVS/LV PW ratio, ED                      1              <=1.3  Stroke volume, 2D                        58    ml       ----------  Stroke volume/bsa, 2D                    41    ml/m^2   ----------  LV e&', lateral                           12    cm/s     ----------  LV E/e&', lateral                         6.43           ----------  LV e&', medial                            6.42  cm/s     ----------  LV E/e&', medial                          12.01          ----------  LV e&', average                           9.21  cm/s     ----------  LV E/e&', average                         8.37           ----------    Ventricular septum                       Value          Reference  IVS thickness, ED                        9     mm       ----------    LVOT                                     Value          Reference  LVOT ID,  S                               20    mm       ----------  LVOT area                                3.14  cm^2     ----------  LVOT ID                                  20    mm       ----------  LVOT peak velocity, S                    84.2  cm/s     ----------  LVOT mean velocity, S                    55.3  cm/s     ----------  LVOT VTI, S                              18.5  cm       ----------  Stroke volume (SV), LVOT DP              58.1  ml       ----------  Stroke index (SV/bsa), LVOT DP           41    ml/m^2   ----------    Aortic valve                             Value          Reference  Aortic valve peak velocity, S            228   cm/s     ----------  Aortic valve mean velocity, S            146   cm/s     ----------  Aortic valve VTI, S                      45    cm       ----------  Aortic mean gradient, S                  10    mm Hg    ----------  Aortic peak gradient, S                   21    mm Hg    ----------  VTI ratio, LVOT/AV                       0.41           ----------  Aortic valve area, VTI                   1.29  cm^2     ----------  Aortic valve area/bsa, VTI               0.91  cm^2/m^2 ----------  Velocity ratio, peak, LVOT/AV  0.37           ----------  Aortic valve area, peak velocity         1.16  cm^2     ----------  Aortic valve area/bsa, peak              0.82  cm^2/m^2 ----------  velocity  Velocity ratio, mean, LVOT/AV            0.38           ----------  Aortic valve area, mean velocity         1.19  cm^2     ----------  Aortic valve area/bsa, mean              0.84  cm^2/m^2 ----------  velocity  Aortic regurg pressure half-time         724   ms       ----------    Aorta                                    Value          Reference  Aortic root ID, ED                       32    mm       ----------  Ascending aorta ID, A-P, S               35    mm       ----------    Left atrium                              Value          Reference  LA ID, A-P, ES                           43    mm       ----------  LA ID/bsa, A-P                   (H)     3.03  cm/m^2   <=2.2  LA volume, S                             47.2  ml       ----------  LA volume/bsa, S                         33.3  ml/m^2   ----------  LA volume, ES, 1-p A4C                   40.8  ml       ----------  LA volume/bsa, ES, 1-p A4C               28.8  ml/m^2   ----------  LA volume, ES, 1-p A2C                   54.2  ml       ----------  LA volume/bsa, ES, 1-p A2C               38.2  ml/m^2   ----------    Mitral valve  Value          Reference  Mitral E-wave peak velocity              77.1  cm/s     ----------  Mitral A-wave peak velocity              75.2  cm/s     ----------  Mitral deceleration time         (H)     313   ms       150 - 230  Mitral peak gradient, D                  2     mm Hg    ----------  Mitral E/A ratio, peak                    1              ----------    Pulmonary arteries                       Value          Reference  PA pressure, S, DP               (H)     32    mm Hg    <=30    Tricuspid valve                          Value          Reference  Tricuspid regurg peak velocity           270   cm/s     ----------  Tricuspid peak RV-RA gradient            29    mm Hg    ----------    Right atrium                             Value          Reference  RA ID, S-I, ES, A4C                      46    mm       34 - 49  RA area, ES, A4C                         18.1  cm^2     8.3 - 19.5  RA volume, ES, A/L                       56    ml       ----------  RA volume/bsa, ES, A/L                   39.5  ml/m^2   ----------    Systemic veins                           Value          Reference  Estimated CVP                            3     mm Hg    ----------  Right ventricle                          Value          Reference  RV ID, minor axis, ED, A4C base          33    mm       ----------  TAPSE                                    20.4  mm       ----------  RV pressure, S, DP               (H)     32    mm Hg    <=30  RV s&', lateral, S                        14.4  cm/s     ----------  Legend: (L)  and  (H)  mark values outside specified reference range.  ------------------------------------------------------------------- Prepared and Electronically Authenticated by  Buford Dresser 2020-01-08T14:12:17      ECHOCARDIOGRAM REPORT       Patient Name:   Joshua Hudson Date of Exam: 03/18/2019 Medical Rec #:  [id number]       Height:       63.0 in Accession #:    [id number]      Weight:       109.1 lb Date of Birth:  26-Jun-1935       BSA:          1.49 m Patient Age:    73 years        BP:           102/46 mmHg Patient Gender: M               HR:           63 bpm. Exam Location:  [facility]    Procedure: 2D Echo, 3D Echo, Cardiac Doppler and Color Doppler  STAT  ECHO  Indications:    R01.1 Murmur   History:        Patient has prior history of Echocardiogram examinations, most                 recent 09/11/2018. CHF CAD Atrial Fibrillation Aortic Valve                 Disease Signs/Symptoms: Shortness of Breath Risk Factors:                 Hypertension, Family History of Coronary Artery Disease and                 Former Smoker. Pre-Operative Eval for Right Shoulder Repair,                 Edema, Aortic Valve Replacement (2009- 25 mm Edwards tissue).   Sonographer:    [place] Referring Phys: [place]    1. The left ventricle has normal systolic function, with an ejection fraction of 55-60%. The cavity size was normal. Left ventricular diastolic Doppler parameters are consistent with restrictive filling. Elevated left atrial and left ventricular  end-diastolic pressures There is right ventricular volume and pressure overload.  2. The right ventricle has mildly reduced systolic function. The cavity was mildly  enlarged. There is no increase in right ventricular wall thickness. Right ventricular systolic pressure is severely elevated with an estimated pressure of 64.2 mmHg.  3. Left atrial size was moderately dilated.  4. Right atrial size was moderately dilated.  5. Mitral valve regurgitation is moderate by color flow Doppler.  6. Tricuspid valve regurgitation is moderate.  7. S/P AVR with a 25 mm Edwards bioprosthetic valve. Aortic valve regurgitation is moderate by color flow Doppler. No stenosis of the aortic valve.  8. The inferior vena cava was dilated in size with >50% respiratory variability.  SUMMARY   Since the last study on 09/11/2018 aortic regurgitation is now moderate, there is on stenosis. There is normal LVEF 55-60% with grade 3 diastolic dysfunction and elevated filling pressures. There is RV pressure and volume overload, moderate pulmonary hypertension.  FINDINGS  Left Ventricle: The  left ventricle has normal systolic function, with an ejection fraction of 55-60%. The cavity size was normal. There is no increase in left ventricular wall thickness. Left ventricular diastolic Doppler parameters are consistent with  restrictive filling. Elevated left atrial and left ventricular end-diastolic pressures There is the interventricular septum is flattened in systole and diastole, consistent with right ventricular pressure and volume overload.  Right Ventricle: The right ventricle has mildly reduced systolic function. The cavity was mildly enlarged. There is no increase in right ventricular wall thickness. Right ventricular systolic pressure is severely elevated with an estimated pressure of  64.2 mmHg.  Left Atrium: Left atrial size was moderately dilated.  Right Atrium: Right atrial size was moderately dilated. Right atrial pressure is estimated at 8 mmHg.  Interatrial Septum: No atrial level shunt detected by color flow Doppler.  Pericardium: The pericardium was not well visualized.  Mitral Valve: The mitral valve is normal in structure. Mitral valve regurgitation is moderate by color flow Doppler.  Tricuspid Valve: The tricuspid valve is normal in structure. Tricuspid valve regurgitation is moderate by color flow Doppler.  Aortic Valve: The aortic valve has been repaired/replaced Aortic valve regurgitation is moderate by color flow Doppler. There is No stenosis of the aortic valve, with a calculated valve area of 1.65 cm.  Pulmonic Valve: The pulmonic valve was normal in structure. Pulmonic valve regurgitation is not visualized by color flow Doppler.  Venous: The inferior vena cava is dilated in size with greater than 50% respiratory variability.    +--------------+--------++  LEFT VENTRICLE            +----------------+----------++ +--------------+--------++  Diastology                     PLAX 2D                    +----------------+----------++ +--------------+--------++  LV e' lateral:   10.50 cm/s    LVIDd:         4.99 cm    +----------------+----------++ +--------------+--------++  LV E/e' lateral: 12.9          LVIDs:         3.71 cm    +----------------+----------++ +--------------+--------++  LV e' medial:    4.56 cm/s     LV PW:         0.99 cm    +----------------+----------++ +--------------+--------++  LV E/e' medial:  29.6          LV IVS:        0.91 cm    +----------------+----------++ +--------------+--------++  LVOT diam:     2.40 cm    +--------------+--------++  LV SV:         59 ml      +--------------+--------++  LV SV Index:   40.01      +--------------+--------++  LVOT Area:     4.52 cm   +--------------+--------++                            +--------------+--------++  +---------------+---------++  RIGHT VENTRICLE             +---------------+---------++  RV S prime:     9.37 cm/s   +---------------+---------++  TAPSE (M-mode): 1.1 cm      +---------------+---------++  +---------------+-------++-----------++  LEFT ATRIUM              Index         +---------------+-------++-----------++  LA diam:        4.80 cm  3.21 cm/m    +---------------+-------++-----------++  LA Vol (A2C):   94.9 ml  63.49 ml/m   +---------------+-------++-----------++  LA Vol (A4C):   65.8 ml  44.02 ml/m   +---------------+-------++-----------++  LA Biplane Vol: 81.0 ml  54.19 ml/m   +---------------+-------++-----------++ +------------+---------++-----------++  RIGHT ATRIUM            Index         +------------+---------++-----------++  RA Area:     24.00 cm                +------------+---------++-----------++  RA Volume:   78.10 ml   52.25 ml/m   +------------+---------++-----------++  +------------------+------------++  AORTIC VALVE                      +------------------+------------++  AV Area (Vmax):    1.40 cm       +------------------+------------++  AV Area  (Vmean):   1.45 cm       +------------------+------------++  AV Area (VTI):     1.65 cm       +------------------+------------++  AV Vmax:           249.00 cm/s    +------------------+------------++  AV Vmean:          165.500 cm/s   +------------------+------------++  AV VTI:            0.566 m        +------------------+------------++  AV Peak Grad:      24.8 mmHg      +------------------+------------++  AV Mean Grad:      13.0 mmHg      +------------------+------------++  LVOT Vmax:         76.90 cm/s     +------------------+------------++  LVOT Vmean:        52.900 cm/s    +------------------+------------++  LVOT VTI:          0.206 m        +------------------+------------++  LVOT/AV VTI ratio: 0.36           +------------------+------------++  AR PHT:            358 msec       +------------------+------------++   +-------------+-------++  AORTA                   +-------------+-------++  Ao Root diam: 3.30 cm   +-------------+-------++  Ao Asc diam:  3.30 cm   +-------------+-------++  +--------------+---------++    +---------------+-----------++  MITRAL VALVE                   TRICUSPID  VALVE               +--------------+---------++    +---------------+-----------++  MV Area (PHT): 3.24 cm        TR Peak grad:   56.2 mmHg     +--------------+---------++    +---------------+-----------++  MV Peak grad:  7.2 mmHg        TR Vmax:        375.00 cm/s   +--------------+---------++    +---------------+-----------++  MV Mean grad:  2.0 mmHg    +--------------+---------++    +--------------+-------+  MV Vmax:       1.34 m/s        SHUNTS                  +--------------+---------++    +--------------+-------+  MV Vmean:      55.1 cm/s       Systemic VTI:  0.21 m   +--------------+---------++    +--------------+-------+  MV VTI:        0.29 m          Systemic Diam: 2.40 cm  +--------------+---------++    +--------------+-------+  MV PHT:        68 msec      +--------------+---------++  MV Decel Time: 187 msec    +--------------+---------++ +---------------+-----------++  MR Peak grad:   100.8 mmHg    +---------------+-----------++  MR Mean grad:   67.0 mmHg     +---------------+-----------++  MR Vmax:        502.00 cm/s   +---------------+-----------++  MR Vmean:       383.0 cm/s    +---------------+-----------++  MR PISA:        1.01 cm      +---------------+-----------++  MR PISA Radius: 0.40 cm       +---------------+-----------++ +--------------+-----------++  MV E velocity: 135.00 cm/s   +--------------+-----------++  MV A velocity: 33.10 cm/s    +--------------+-----------++  MV E/A ratio:  4.08          +--------------+-----------++    Ena Dawley MD Electronically signed by Ena Dawley MD Signature Date/Time: 03/18/2019/7:36:15 PM     TRANSESOPHOGEAL ECHO REPORT       Patient Name:   Joshua Hudson Date of Exam: 04/02/2019 Medical Rec #:  [id number]       Height:       63.0 in Accession #:    [id number]      Weight:       105.2 lb Date of Birth:  1934/09/14       BSA:          1.47 m Patient Age:    48 years        BP:           120/56 mmHg Patient Gender: M               HR:           54 bpm. Exam Location:  Inpatient    Procedure: Transesophageal Echo, Color Doppler, Cardiac Doppler and 3D Echo  Indications:     Aortic valve disorder 424.1 / I35.9   History:         Patient has prior history of Echocardiogram examinations. CHF                  CAD Risk Factors: Hypertension.   Sonographer:     Darlina Sicilian RDCS Referring Phys:  [place] Diagnosing Phys: Jenkins Rouge MD  PROCEDURE: The transesophogeal probe was passed through the esophogus of the patient. The patient developed no complications during the procedure.  IMPRESSIONS    1. The left ventricle has normal systolic function, with an ejection fraction of 60-65%. The cavity size was mildly dilated. Left  ventricular diastolic function could not be evaluated.  2. The right ventricle has normal systolc function. The cavity was normal. There is no increase in right ventricular wall thickness.  3. No evidence of a thrombus present in the left atrial appendage.  4. Aortic valve regurgitation is severe by color flow Doppler. Mild stenosis of the aortic valve.  5. No vegetation on the aortic valve.  6. 25 mm Edwards stented bioprosthetic valve. Leaflets thickened and calcified Sever AR tha appears central and not peri valvular. Suggest aortic root injection at cath and TAVR CTA to further assess possible valve in valve Rx.  7. Pulmonic valve regurgitation is mild by color flow Doppler.  8. The aortic root is normal in size and structure.  9. Extensive use of 3D imaging done to assess AVR.  FINDINGS  Left Ventricle: The left ventricle has normal systolic function, with an ejection fraction of 60-65%. The cavity size was mildly dilated. There is no increase in left ventricular wall thickness. Left ventricular diastolic function could not be  evaluated.  Right Ventricle: The right ventricle has normal systolic function. The cavity was normal. There is no increase in right ventricular wall thickness.  Left Atrial Appendage: No evidence of a thrombus present in the left atrial appendage.  Right Atrium: Right atrial size was normal in size. Right atrial pressure is estimated at 10 mmHg.  Pericardium: There is no evidence of pericardial effusion.  Mitral Valve: The mitral valve is normal in structure. Mitral valve regurgitation is not visualized by color flow Doppler.  Tricuspid Valve: The tricuspid valve was normal in structure. Tricuspid valve regurgitation is mild by color flow Doppler.  Aortic Valve: The aortic valve has been repaired/replaced Aortic valve regurgitation is severe by color flow Doppler. There is Mild stenosis of the aortic valve. There is no evidence of a vegetation on the  aortic valve. 25 mm Edwards stented  bioprosthetic valve. Leaflets thickened and calcified Sever AR tha appears central and not peri valvular. Suggest aortic root injection at cath and TAVR CTA to further assess possible valve in valve Rx.  Pulmonic Valve: The pulmonic valve was normal in structure. Pulmonic valve regurgitation is mild by color flow Doppler.  Aorta: The aortic root is normal in size and structure.  Additional Findings: Extensive use of 3D imaging done to assess AVR.    +-------------+------------++  AORTIC VALVE                 +-------------+------------++  AV Vmax:      203.00 cm/s    +-------------+------------++  AV Vmean:     131.000 cm/s   +-------------+------------++  AV VTI:       0.476 m        +-------------+------------++  AV Peak Grad: 16.5 mmHg      +-------------+------------++  AV Mean Grad: 8.0 mmHg       +-------------+------------++  AR PHT:       342 msec       +-------------+------------++    Jenkins Rouge MD Electronically signed by Jenkins Rouge MD Signature Date/Time: 04/02/2019/11:33:52 AM  MODIFIED REPORT This report was modified by Jenkins Rouge MD on 04/02/2019 due to billing.  Extensive use of 3D imaging done to visualize the AVR  [id number] Electronically Amended 04/02/2019, 11:34 AM  Extensive use of 3D imaging used to evaluate the patients AVR  [id number] Electronically Amended 04/02/2019, 11:37 AM        RIGHT/LEFT HEART CATH AND CORONARY ANGIOGRAPHY  Conclusion    Angiographically minimal CAD  The left ventricular systolic function is normal -as assessed by aortic root angiogram  There is severe (4+) aortic regurgitation.  LV end diastolic pressure is moderately elevated.   SUMMARY  Angiographically normal coronary arteries with a right dominant system.  Severe aortic insufficiency seen on aortic root angiogram  Mild to moderate elevated LVEDP and PCWP  RECOMMENDATIONS  We  will place the patient in extended recovery/observation status to allow for monitoring of hemoglobin post procedure as well as to obtain consultations from the cardiac structural team and potentially hematology to assess his ongoing anemia.  Anticipate that he should be able to home tomorrow, but would consider checking Cardiac CTA for pre-TAVR prior to discharge.  Otherwise continue home meds.     Glenetta Hew, M.D., M.S. Interventional Cardiologist   Pager # [phone] Phone # [phone] [address] [place], [place] [address]    Recommendations  Antiplatelet/Anticoag No indication for antiplatelet therapy at this time .  Discharge Date In the absence of any other complications or medical issues, we expect the patient to be ready for discharge from a cath perspective on 04/03/2019.  Surgeon Notes    04/02/2019 11:08 AM CV Procedure signed by Josue Hector, MD  Indications  Severe aortic regurgitation [I35.1 (ICD-10-CM)]  S/P AVR (aortic valve replacement) [P95.0 (ICD-10-CM)]  Procedural Details  Technical Details PCP: Dr. Lavone Orn Cardiologist: Dr. [name]  Time Out: Verified patient identification, verified procedure, site/side was marked, verified correct patient position, special equipment/implants available, medications/allergies/relevent history reviewed, required imaging and test results available. Performed.  Access:  RIGHT Radial Artery: 6 Fr sheath -- Seldinger technique using Angiocath Micropuncture Kit -- Direct ultrasound guidance used.  Permanent image obtained and placed on chart. -- 10 mL radial cocktail IA; 2000 Units IV Heparin  * Right Brachial/Antecubital Vein: The existing 18-gauge IV was exchanged over a wire for a 5Fr GLIDE sheath  Right Heart Catheterization: 5 Fr Gordy Councilman catheter advanced under fluoroscopy with balloon inflated to the RA, RV, then PCWP-PA for hemodynamic measurement.  * Simultaneous FA & PA blood  gases checked for SaO2% to calculate FICK CO/CI.   -- A glide wire was used to assist advancement of the catheter from the shoulder to the RV and then from the artery into the PA. * After all pressures and saturations were obtained, the catheter removed completely out of the body with balloon deflated.  Left Heart Catheterization: 5 Fr Catheters advanced or exchanged over a J-wire under direct fluoroscopic guidance into the ascending aorta; TIG 4.0 catheter advanced first.  * LV Hemodynamics (Aortic Root Angiogram): Angled pigtail catheter * Left Coronary Artery Cineangiography: TIG 4.0 catheter  * Right Coronary Artery Cineangiography: JR4 catheter catheter   Upon completion of Angiogaphy, the catheter was removed completely out of the body over a wire, without complication.  Femoral / Brachial Sheath(s) removed in the Cath Lab with manual pressure for hemostasis.   Radial sheath removed in the Cardiac Catheterization lab with TR Band placed for hemostasis.  TR Band: 1305 Hours; 12 mL air --After  band was placed, there was a hematoma proximal to the band.  5 minutes of manual pressure held with no further hematoma or bleeding.  MEDICATIONS * SQ Lidocaine 65m * Radial Cocktail: 3 mg Verapmil in 10 mL NS * Isovue Contrast: 80 mL * Heparin: 2000 units  Fluoro time: 15.1 minutes. Dose Area Product: 9729 mGycm2. Cumulative Air Kerma: 151 mGy.    Estimated blood loss <50 mL.   During this procedure medications were administered to achieve and maintain moderate conscious sedation while the patient's heart rate, blood pressure, and oxygen saturation were continuously monitored and I was present face-to-face 100% of this time.  Medications (Filter: Administrations occurring from 04/02/19 1142 to 04/02/19 1328) (important)  Continuous medications are totaled by the amount administered until 04/02/19 1328.  Medication Rate/Dose/Volume Action  Date Time   lidocaine (PF) (XYLOCAINE) 1 %  injection (mL) 2 mL Given 04/02/19 1211   Total dose as of 04/02/19 1328 2 mL Given 1212   4 mL        Heparin (Porcine) in NaCl 1000-0.9 UT/500ML-% SOLN (mL) 500 mL Given 04/02/19 1211   Total dose as of 04/02/19 1328 500 mL Given 1211   1,000 mL        Radial Cocktail/Verapamil only (mL) 10 mL Given 04/02/19 1215   Total dose as of 04/02/19 1328        10 mL        heparin injection (Units) 2,000 Units Given 04/02/19 1232   Total dose as of 04/02/19 1328        2,000 Units        iohexol (OMNIPAQUE) 350 MG/ML injection (mL) 80 mL Given 04/02/19 1309   Total dose as of 04/02/19 1328        80 mL        Complications  Complications documented before study signed (04/02/2019 1:43 PM)   RIGHT/LEFT HEART CATH AND CORONARY ANGIOGRAPHY  Bleeding at access site Documented by HLeonie Man MD 04/02/2019 1:27 PM  Date Found: 04/02/2019  Outcome: Resolved in Lab  Time Range: Intraprocedure  Comment: Manual pressure held for small hematoma      Coronary Findings  Diagnostic Dominance: Co-dominant Left Anterior Descending  First Diagonal Branch  Vessel is small in size.  First Septal Branch  Vessel is small in size.  Second Diagonal Branch  Vessel is moderate in size  Right Coronary Artery  Acute Marginal Branch  Vessel is small in size.  Right Posterior Descending Artery  Vessel is actually moderate in size The vessel is tortuous.  Second Right Posterolateral Branch  Moderate size vessel  Intervention  No interventions have been documented. Right Heart  Right Heart Pressures PA P-mean: 39/13 mmHg - 24 mmHg (borderline. PCWP 22/34 mmHg - 21 mmHg with very large V wave Elevated LV EDP consistent with volume overload. LV P-EDP 112/7 mmHg - 19 mmHg AOP-MAP 112/40 mmHg - 66 mmHg Ao sat 96%, PA sat 55% CARDIAC OUTPUT-INDEX (Fick): 4.96-3.36  Right Atrium The right atrial size is at the upper limits of normal. Right atrial pressure is elevated. RAP 10 mmHg  Right Ventricle  The right ventricle is mildly dilated. RVP-EDP 45/2 mmHg - 12 mmHg  Wall Motion  Resting       LV gram was not performed, however, supravalvular aortogram opacified the LV enough to assess wall motion. fill the ventricle          Left Heart  Left Ventricle The left ventricular size is  in the upper limits of normal. The left ventricular systolic function is normal. LV end diastolic pressure is moderately elevated.  Aortic Valve There is severe (4+) aortic regurgitation. The patient has a bioprosthetic aortic valve that functions abnormally. Supravalvular aortogram almost fully opacified the LV.  Coronary Diagrams  Diagnostic Dominance: Co-dominant  Intervention  Implants   No implant documentation for this case.  Syngo Images  Show images for CARDIAC CATHETERIZATION  Images on Long Term Storage  Show images for Vickery, Ryden Wainer "Ben"   Link to Procedure Log  Procedure Log    Hemo Data   Most Recent Value  Fick Cardiac Output 4.96 L/min  Fick Cardiac Output Index 3.36 (L/min)/BSA  RA A Wave 12 mmHg  RA V Wave 13 mmHg  RA Mean 10 mmHg  RV Systolic Pressure 45 mmHg  RV Diastolic Pressure 2 mmHg  RV EDP 12 mmHg  PA Systolic Pressure 39 mmHg  PA Diastolic Pressure 13 mmHg  PA Mean 24 mmHg  PW A Wave 22 mmHg  PW V Wave 34 mmHg  PW Mean 17 mmHg  AO Systolic Pressure 081 mmHg  AO Diastolic Pressure 36 mmHg  AO Mean 60 mmHg  LV Systolic Pressure 448 mmHg  LV Diastolic Pressure 8 mmHg  LV EDP 17 mmHg  AOp Systolic Pressure 185 mmHg  AOp Diastolic Pressure 40 mmHg  AOp Mean Pressure 66 mmHg  LVp Systolic Pressure 631 mmHg  LVp Diastolic Pressure 7 mmHg  LVp EDP Pressure 21 mmHg  QP/QS 1  TPVR Index 7.14 HRUI  TSVR Index 17.85 HRUI  PVR SVR Ratio 0.14  TPVR/TSVR Ratio 0.4     STS Risk Calculator  Procedure: Isolated redo AVR   Risk of Mortality: 10.444%  Renal Failure: 5.449%  Permanent Stroke: 3.218%  Prolonged Ventilation: 25.063%  DSW Infection:  0.145%  Reoperation: 12.999%  Morbidity or Mortality: 37.026%  Short Length of Stay: 10.666%  Long Length of Stay: 24.054%    Impression:  Patient has prosthetic valve dysfunction involving bioprosthetic tissue valve placed in the aortic position in 2009 and presents with acute diastolic congestive heart failure, [employer] [place] Heart Association functional class IIIb.  I have personally reviewed the patient's recent transthoracic and transesophageal echocardiograms, diagnostic cardiac catheterization, and CT angiograms.  There is severe aortic insufficiency of the patient's bioprosthetic valve in the aortic position.  There is no sign of bacterial endocarditis.  There is normal left ventricular systolic function with mild left ventricular chamber enlargement.  Diagnostic cardiac catheterization is notable for the absence of significant coronary artery disease.  There is no question the patient needs redo aortic valve replacement, and definitive treatment should be sooner rather than later.  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  The patient's original tissue valve was 25 mm diameter in the appears to be adequate height of the origin of both the left main and the right coronary arteries as well as adequate diameter of the sinuses of Valsalva.  CTA of the aorta and iliac vessels demonstrate what appears to be adequate pelvic vascular access to facilitate a transfemoral approach.  Finally, I suspect the patient's recent onset of anemia may be related to hemolysis.    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  Alternative approaches such as  conventional surgical redo aortic valve replacement, valve-in-valve transcatheter aortic valve replacement, and continued medical therapy without intervention were compared and contrasted at length.  The risks associated with conventional surgical redo aortic valve replacement were discussed in detail, as were expectations for post-operative convalescence, and why I would be reluctant to consider this patient a candidate for conventional surgery.  Issues specific to valve-in-valve transcatheter aortic valve replacement were discussed including questions about long term valve durability, the potential for valve thrombosis or paravalvular leak, possible need for permanent pacemaker placement, and other technical complications related to the procedure itself.  Long-term prognosis with medical therapy was discussed. This discussion was placed in the context of the patient's own specific clinical presentation and past medical history.  All of their questions have been addressed.  The patient desires to proceed with valve in valve transcatheter aortic valve replacement soon as practical.  We tentatively plan for surgery on April 15, 2019.  Following the decision to proceed with transcatheter aortic valve replacement, a discussion has been held regarding what types of management strategies would be attempted intraoperatively in the event of life-threatening complications, including whether or not the patient would be considered a candidate for the use of cardiopulmonary bypass and/or conversion to open sternotomy for attempted surgical intervention.  The patient has been advised of a variety of complications that might develop including but not limited to risks of death, stroke, paravalvular leak, aortic dissection or other major vascular complications, aortic annulus rupture, device embolization, cardiac rupture or perforation, mitral regurgitation, acute myocardial infarction, arrhythmia, heart block or bradycardia  requiring permanent pacemaker placement, congestive heart failure, respiratory failure, renal failure, pneumonia, infection, other late complications related to structural valve deterioration or migration, or other complications that might ultimately cause a temporary or permanent loss of functional independence or other long term morbidity.  The patient provides full informed consent for the procedure as described and all questions were answered.    I spent in excess of 90 minutes during the conduct of this office consultation and >50% of this time involved direct face-to-face encounter with the patient for counseling and/or coordination of their care.      Valentina Gu. Roxy Manns, MD 04/10/2019 2:54 PM

## 2019-04-10 NOTE — Progress Notes (Signed)
[place], [place] - [address] [place] [place] [address] Phone: [phone] Fax: [phone]      Your procedure is scheduled on August 11th.  Report to [facility] Main Entrance "A" at 5:30 A.M., and check in at the Admitting office.  Call this number if you have problems the morning of surgery:  [phone]  Call [phone] if you have any questions prior to your surgery date Monday-Friday 8am-4pm    Remember:  Do not eat or drink after midnight the night before your surgery    Take these medicines the morning of surgery with A SIP OF WATER   Tylenol - if needed  Metoprolol  Oxycodone - if needed  Protonix  Prednisone  Tamsulosin (Flomax)  Eye drops - if needed  7 days prior to surgery STOP taking any Aspirin (unless otherwise instructed by your surgeon), Aleve, Naproxen, Ibuprofen, Motrin, Advil, Goody's, BC's, all herbal medications, fish oil, and all vitamins.      The Morning of Surgery  Do not wear jewelry.  Do not wear lotions, powders, colognes, or deodorant  Men may shave face and neck.  Do not bring valuables to the hospital.  [facility] is not responsible for any belongings or valuables.  If you are a smoker, DO NOT Smoke 24 hours prior to surgery IF you wear a CPAP at night please bring your mask, tubing, and machine the morning of surgery   Remember that you must have someone to transport you home after your surgery, and remain with you for 24 hours if you are discharged the same day.   Contacts, glasses, hearing aids, dentures or bridgework may not be worn into surgery.    Leave your suitcase in the car.  After surgery it may be brought to your room.  For patients admitted to the hospital, discharge time will be determined by your treatment team.  Patients discharged the day of surgery will not be allowed to drive home.    Special instructions:   [place]- Preparing For  Surgery  Before surgery, you can play an important role. Because skin is not sterile, your skin needs to be as free of germs as possible. You can reduce the number of germs on your skin by washing with CHG (chlorahexidine gluconate) Soap before surgery.  CHG is an antiseptic cleaner which kills germs and bonds with the skin to continue killing germs even after washing.    Oral Hygiene is also important to reduce your risk of infection.  Remember - BRUSH YOUR TEETH THE MORNING OF SURGERY WITH YOUR REGULAR TOOTHPASTE  Please do not use if you have an allergy to CHG or antibacterial soaps. If your skin becomes reddened/irritated stop using the CHG.  Do not shave (including legs and underarms) for at least 48 hours prior to first CHG shower. It is OK to shave your face.  Please follow these instructions carefully.   1. Shower the NIGHT BEFORE SURGERY and the MORNING OF SURGERY with CHG Soap.   2. If you chose to wash your hair, wash your hair first as usual with your normal shampoo.  3. After you shampoo, rinse your hair and body thoroughly to remove the shampoo.  4. Use CHG as you would any other liquid soap. You can apply CHG directly to the skin and wash gently with a scrungie or a clean washcloth.   5. Apply the CHG Soap to your body ONLY FROM THE NECK DOWN.  Do not use on open wounds or open sores. Avoid contact with your eyes, ears, mouth and genitals (private parts). Wash Face and genitals (private parts)  with your normal soap.   6. Wash thoroughly, paying special attention to the area where your surgery will be performed.  7. Thoroughly rinse your body with warm water from the neck down.  8. DO NOT shower/wash with your normal soap after using and rinsing off the CHG Soap.  9. Pat yourself dry with a CLEAN TOWEL.  10. Wear CLEAN PAJAMAS to bed the night before surgery, wear comfortable clothes the morning of surgery  11. Place CLEAN SHEETS on your bed the night of your first  shower and DO NOT SLEEP WITH PETS.   Day of Surgery:  Do not apply any deodorants/lotions. Please shower the morning of surgery with the CHG soap  Please wear clean clothes to the hospital/surgery center.   Remember to brush your teeth WITH YOUR REGULAR TOOTHPASTE.   Please read over the following fact sheets that you were given.

## 2019-04-10 NOTE — Patient Instructions (Signed)
  Continue taking all current medications without change through the day before surgery.  Have nothing to eat or drink after midnight the night before surgery.  On the morning of surgery take only Protonix with a sip of water.    

## 2019-04-11 ENCOUNTER — Encounter (HOSPITAL_COMMUNITY)
Admission: RE | Admit: 2019-04-11 | Discharge: 2019-04-11 | Disposition: A | Discharge status: Home / Self Care | Payer: Medicare Other | Source: Ambulatory Visit | Attending: Cardiovascular Disease | Admitting: Cardiovascular Disease

## 2019-04-11 ENCOUNTER — Other Ambulatory Visit: Payer: Self-pay

## 2019-04-11 ENCOUNTER — Ambulatory Visit: Payer: Medicare Other | Attending: Cardiovascular Disease | Admitting: Physical Therapy

## 2019-04-11 ENCOUNTER — Encounter: Payer: Self-pay | Admitting: Physical Therapy

## 2019-04-11 ENCOUNTER — Other Ambulatory Visit (HOSPITAL_COMMUNITY)
Admission: RE | Admit: 2019-04-11 | Discharge: 2019-04-11 | Disposition: A | Discharge status: Home / Self Care | Payer: Medicare Other | Source: Ambulatory Visit | Attending: Cardiovascular Disease | Admitting: Cardiovascular Disease

## 2019-04-11 ENCOUNTER — Encounter (HOSPITAL_COMMUNITY): Payer: Self-pay

## 2019-04-11 ENCOUNTER — Ambulatory Visit (HOSPITAL_COMMUNITY)
Admission: RE | Admit: 2019-04-11 | Discharge: 2019-04-11 | Disposition: A | Discharge status: Home / Self Care | Payer: Medicare Other | Source: Ambulatory Visit | Attending: Cardiovascular Disease | Admitting: Cardiovascular Disease

## 2019-04-11 DIAGNOSIS — Z01818 Encounter for other preprocedural examination: Secondary | ICD-10-CM | POA: Insufficient documentation

## 2019-04-11 DIAGNOSIS — I351 Nonrheumatic aortic (valve) insufficiency: Secondary | ICD-10-CM

## 2019-04-11 DIAGNOSIS — Z20828 Contact with and (suspected) exposure to other viral communicable diseases: Secondary | ICD-10-CM | POA: Diagnosis not present

## 2019-04-11 DIAGNOSIS — R2689 Other abnormalities of gait and mobility: Secondary | ICD-10-CM | POA: Insufficient documentation

## 2019-04-11 DIAGNOSIS — M6281 Muscle weakness (generalized): Secondary | ICD-10-CM | POA: Diagnosis present

## 2019-04-11 LAB — CBC
HCT: 22.3 % — ABNORMAL LOW (ref 39.0–52.0)
Hemoglobin: 7 g/dL — ABNORMAL LOW (ref 13.0–17.0)
MCH: 22.7 pg — ABNORMAL LOW (ref 26.0–34.0)
MCHC: 31.4 g/dL (ref 30.0–36.0)
MCV: 72.2 fL — ABNORMAL LOW (ref 80.0–100.0)
Platelets: 83 10*3/uL — ABNORMAL LOW (ref 150–400)
RBC: 3.09 MIL/uL — ABNORMAL LOW (ref 4.22–5.81)
RDW: 19.4 % — ABNORMAL HIGH (ref 11.5–15.5)
WBC: 5.1 10*3/uL (ref 4.0–10.5)
nRBC: 0 % (ref 0.0–0.2)

## 2019-04-11 LAB — URINALYSIS, ROUTINE W REFLEX MICROSCOPIC
Bacteria, UA: NONE SEEN
Bilirubin Urine: NEGATIVE
Glucose, UA: NEGATIVE mg/dL
Hgb urine dipstick: NEGATIVE
Ketones, ur: 5 mg/dL — AB
Leukocytes,Ua: NEGATIVE
Nitrite: NEGATIVE
Protein, ur: 30 mg/dL — AB
Specific Gravity, Urine: 1.02 (ref 1.005–1.030)
pH: 6 (ref 5.0–8.0)

## 2019-04-11 LAB — COMPREHENSIVE METABOLIC PANEL
ALT: 30 U/L (ref 0–44)
AST: 38 U/L (ref 15–41)
Albumin: 3.6 g/dL (ref 3.5–5.0)
Alkaline Phosphatase: 58 U/L (ref 38–126)
Anion gap: 10 (ref 5–15)
BUN: 17 mg/dL (ref 8–23)
CO2: 24 mmol/L (ref 22–32)
Calcium: 8.5 mg/dL — ABNORMAL LOW (ref 8.9–10.3)
Chloride: 102 mmol/L (ref 98–111)
Creatinine, Ser: 1.17 mg/dL (ref 0.61–1.24)
GFR calc Af Amer: >60 mL/min (ref >60)
GFR calc non Af Amer: 58 mL/min — ABNORMAL LOW (ref >60)
Glucose, Bld: 150 mg/dL — ABNORMAL HIGH (ref 70–99)
Potassium: 4 mmol/L (ref 3.5–5.1)
Sodium: 136 mmol/L (ref 135–145)
Total Bilirubin: 1.3 mg/dL — ABNORMAL HIGH (ref 0.3–1.2)
Total Protein: 5.7 g/dL — ABNORMAL LOW (ref 6.5–8.1)

## 2019-04-11 LAB — BRAIN NATRIURETIC PEPTIDE: B Natriuretic Peptide: 834.7 pg/mL — ABNORMAL HIGH (ref 0.0–100.0)

## 2019-04-11 LAB — SURGICAL PCR SCREEN
MRSA, PCR: NEGATIVE
Staphylococcus aureus: NEGATIVE

## 2019-04-11 LAB — APTT: aPTT: 28 seconds (ref 24–36)

## 2019-04-11 LAB — HEMOGLOBIN A1C
Hgb A1c MFr Bld: 4.8 % (ref 4.8–5.6)
Mean Plasma Glucose: 91.06 mg/dL

## 2019-04-11 LAB — PROTIME-INR
INR: 1.4 — ABNORMAL HIGH (ref 0.8–1.2)
Prothrombin Time: 17.2 seconds — ABNORMAL HIGH (ref 11.4–15.2)

## 2019-04-11 NOTE — Pre-Procedure Instructions (Signed)
[place], [place] - [address] [place] [place] [address] Phone: [phone] Fax: [phone]      Your procedure is scheduled on  04-15-19  Report to [facility] Main Entrance "A" at 0530A.M., and check in at the Admitting office.  Call this number if you have problems the morning of surgery:  [phone]  Call [phone] if you have any questions prior to your surgery date Monday-Friday 8am-4pm    Remember:  Do not eat or drink after midnight the night before your surgery     Take these medicines the morning of surgery with A SIP OF WATER :none  Follow your surgeon's instructions on when to stop Aspirin.  If no instructions were given by your surgeon then you will need to call the office to get those instructions.    7 days prior to surgery STOP taking any Aspirin (unless otherwise instructed by your surgeon), Aleve, Naproxen, Ibuprofen, Motrin, Advil, Goody's, BC's, all herbal medications, fish oil, and all vitamins.    The Morning of Surgery  Do not wear jewelry.  Do not wear lotions, powders, or perfumes/cologne    Men may shave face and neck.  Do not bring valuables to the hospital.  [facility] is not responsible for any belongings or valuables.  If you are a smoker, DO NOT Smoke 24 hours prior to surgery IF you wear a CPAP at night please bring your mask, tubing, and machine the morning of surgery   Remember that you must have someone to transport you home after your surgery, and remain with you for 24 hours if you are discharged the same day.   Contacts, glasses, hearing aids, dentures or bridgework may not be worn into surgery.    Leave your suitcase in the car.  After surgery it may be brought to your room.  For patients admitted to the hospital, discharge time will be determined by your treatment team.  Patients discharged the day of surgery will not be allowed to drive home.    Special  instructions:   [place]- Preparing For Surgery  Before surgery, you can play an important role. Because skin is not sterile, your skin needs to be as free of germs as possible. You can reduce the number of germs on your skin by washing with CHG (chlorahexidine gluconate) Soap before surgery.  CHG is an antiseptic cleaner which kills germs and bonds with the skin to continue killing germs even after washing.    Oral Hygiene is also important to reduce your risk of infection.  Remember - BRUSH YOUR TEETH THE MORNING OF SURGERY WITH YOUR REGULAR TOOTHPASTE  Please do not use if you have an allergy to CHG or antibacterial soaps. If your skin becomes reddened/irritated stop using the CHG.  Do not shave (including legs and underarms) for at least 48 hours prior to first CHG shower. It is OK to shave your face.  Please follow these instructions carefully.   1. Shower the NIGHT BEFORE SURGERY and the MORNING OF SURGERY with CHG Soap.   2. If you chose to wash your hair, wash your hair first as usual with your normal shampoo.  3. After you shampoo, rinse your hair and body thoroughly to remove the shampoo.  4. Use CHG as you would any other liquid soap. You can apply CHG directly to the skin and wash gently with a scrungie or a clean washcloth.   5. Apply the CHG Soap to your  body ONLY FROM THE NECK DOWN.  Do not use on open wounds or open sores. Avoid contact with your eyes, ears, mouth and genitals (private parts). Wash Face and genitals (private parts)  with your normal soap.   6. Wash thoroughly, paying special attention to the area where your surgery will be performed.  7. Thoroughly rinse your body with warm water from the neck down.  8. DO NOT shower/wash with your normal soap after using and rinsing off the CHG Soap.  9. Pat yourself dry with a CLEAN TOWEL.  10. Wear CLEAN PAJAMAS to bed the night before surgery, wear comfortable clothes the morning of surgery  11. Place CLEAN  SHEETS on your bed the night of your first shower and DO NOT SLEEP WITH PETS.    Day of Surgery:  Do not apply any deodorants/lotions. Please shower the morning of surgery with the CHG soap  Please wear clean clothes to the hospital/surgery center.   Remember to brush your teeth WITH YOUR REGULAR TOOTHPASTE.   Please read over the following fact sheets that you were given.

## 2019-04-11 NOTE — Progress Notes (Signed)
  Coronavirus Screening Scheduled for COVID test today Have you experienced the following symptoms:  Cough yes/no: No Fever (>100.54F)  yes/no: No Runny nose yes/no: No Sore throat yes/no: No Difficulty breathing/shortness of breath  yes/no: No Have you or a family member traveled in the last 14 days and where? yes/no: No  PCP - Dr Lavone Orn  Cardiologist - Dr Golden Hurter  Chest x-ray - Today  EKG - 03-18-19  Stress Test - >6 yrs ,but does not recall location  ECHO - 04-08-19  Cardiac Cath - 04-02-19  AICD-denies PM-denies LOOP-denies  Sleep Study - No CPAP - NA  LABS-CBC,CMP,T/S,A1C,PTT,PT-INR,ABG,BNP  ASA-LD 04/10/19  ERAS-NA  HA1C-Denies being a diabetic Fasting Blood Sugar -  Checks Blood Sugar _____ times a day  Anesthesia-Y Cardiac history  Pt denies having chest pain, sob, or fever at this time. All instructions explained to the pt, with a verbal understanding of the material. Pt agrees to go over the instructions while at home for a better understanding. Pt also instructed to self quarantine after being tested for COVID-19. The opportunity to ask questions was provided. ^

## 2019-04-11 NOTE — Therapy (Signed)
[place], [place], [address] Phone: [phone]   Fax:  [phone]  Physical Therapy Evaluation  Patient Details  Name: Joshua Hudson MRN: [id number] Date of Birth: 04-08-1935 Referring Provider (PT): Lauree Chandler MD   Encounter Date: 04/11/2019  PT End of Session - 04/11/19 1054    Visit Number  1    Number of Visits  1    Date for PT Re-Evaluation  04/11/19    PT Start Time  1100    PT Stop Time  1132    PT Time Calculation (min)  32 min    Activity Tolerance  Patient tolerated treatment well    Behavior During Therapy  [facility] for tasks assessed/performed       Past Medical History:  Diagnosis Date  . Anemia    Previous history of anemia  . Aortic insufficiency 03/24/2019   AI of bioprosthetic AVR  . Chronic diastolic CHF (congestive heart failure) ([place]) 03/24/2019  . Colon polyps    s/p diverticular perforation requiring 2-stage repair  . Diverticulosis   . DJD (degenerative joint disease), lumbosacral   . Esophageal stricture   . GERD (gastroesophageal reflux disease)   . History of blood transfusion    patient states "years ago"  . History of kidney stones    passed stones  . Hypertension   . Mitral regurgitation    moderate  . Occipital neuralgia   . Postoperative atrial fibrillation ([place]) 05/10/2015  . Prosthetic valve dysfunction   . Rheumatoid arthritis ([place])    s/o long term steroids  shoulders and hands  . Rotator cuff arthropathy    right  . S/P aortic valve replacement with bioprosthetic valve 01/16/2008   36mm Edwards Magna perimount bovine pericardial tissue valve, model 3000  . SBE (subacute bacterial endocarditis) prophylaxis candidate    for dental procedures  . Severe aortic stenosis    S/P prosthetic valve replacement w 25 mm Edwards like science percardial tissue valve,[place] - 01/2008    Past Surgical History:  Procedure Laterality Date  . APPENDECTOMY    .  BOTOX INJECTION N/A 01/22/2018   Procedure: BOTOX INJECTION;  Surgeon: Ronnette Juniper, MD;  Location: [facility];  Service: Gastroenterology;  Laterality: N/A;  . CARDIAC CATHETERIZATION     09  . CARDIAC VALVE REPLACEMENT  01/2008   aortic valve replacement  . CATARACT EXTRACTION W/ INTRAOCULAR LENS  IMPLANT, BILATERAL    . COLON RESECTION     diverticulitis   . dental implants     permanent  . ESOPHAGEAL MANOMETRY N/A 11/07/2017   Procedure: ESOPHAGEAL MANOMETRY (EM);  Surgeon: Ronnette Juniper, MD;  Location: [facility];  Service: Gastroenterology;  Laterality: N/A;  . ESOPHAGOGASTRODUODENOSCOPY (EGD) WITH PROPOFOL N/A 01/22/2018   Procedure: ESOPHAGOGASTRODUODENOSCOPY (EGD) WITH PROPOFOL;  Surgeon: Ronnette Juniper, MD;  Location: [facility];  Service: Gastroenterology;  Laterality: N/A;  . EXCISIONAL TOTAL SHOULDER ARTHROPLASTY WITH ANTIBIOTIC SPACER Right 12/31/2018   Procedure: EXCISIONAL TOTAL SHOULDER ARTHROPLASTY WITH ANTIBIOTIC SPACER;  Surgeon: Netta Cedars, MD;  Location: [place];  Service: Orthopedics;  Laterality: Right;  . HERNIA REPAIR    . IRRIGATION AND DEBRIDEMENT SHOULDER Right 11/20/2018    IRRIGATION AND DEBRIDEMENT SHOULDER WITH POLY EXCHANGE (Right Shoulder)  . IRRIGATION AND DEBRIDEMENT SHOULDER Right 11/20/2018   Procedure: IRRIGATION AND DEBRIDEMENT SHOULDER WITH POLY EXCHANGE;  Surgeon: Netta Cedars, MD;  Location: [place];  Service: Orthopedics;  Laterality: Right;  . LUMBAR LAMINECTOMY  x 2  . REVERSE SHOULDER ARTHROPLASTY Right 03/01/2018   Procedure: RIGHT REVERSE SHOULDER ARTHROPLASTY;  Surgeon: Netta Cedars, MD;  Location: [place];  Service: Orthopedics;  Laterality: Right;  . RIGHT/LEFT HEART CATH AND CORONARY ANGIOGRAPHY N/A 04/02/2019   Procedure: RIGHT/LEFT HEART CATH AND CORONARY ANGIOGRAPHY;  Surgeon: Leonie Man, MD;  Location: [place] [facility];  Service: Cardiovascular;  Laterality: N/A;  . SAVORY DILATION N/A 01/22/2018   Procedure: SAVORY DILATION;   Surgeon: Ronnette Juniper, MD;  [facility];  Service: Gastroenterology;  Laterality: N/A;  . SHOULDER HEMI-ARTHROPLASTY Right 06/14/2018   Procedure: RIGHT  REVERSE TOTAL SHOULDER OPEN POLY EXCHANGE;  Surgeon: Netta Cedars, MD;  Location: [place];  Service: Orthopedics;  Laterality: Right;  . TEE WITHOUT CARDIOVERSION N/A 01/29/2014   Procedure: TRANSESOPHAGEAL ECHOCARDIOGRAM (TEE);  Surgeon: Sueanne Margarita, MD;  Location: [facility];  Service: Cardiovascular;  Laterality: N/A;  . TEE WITHOUT CARDIOVERSION N/A 04/02/2019   Procedure: TRANSESOPHAGEAL ECHOCARDIOGRAM (TEE);  Surgeon: Josue Hector, MD;  Location: [facility];  Service: Cardiovascular;  Laterality: N/A;  . TONSILLECTOMY      There were no vitals filed for this visit.   Subjective Assessment - 04/11/19 1054    Subjective  pt is a 84 y.o m with CC of significant fatigue that started about 6 months ang has progressively worsened and states the legs just want to give out. He reports having history of a valve relplacement previosly. Reports hx or R shoulder issues which was trying to get it operated on but was unable to get it done due to the heart.    Patient Stated Goals  to get heart better    Currently in Pain?  Yes    Pain Score  7     Pain Location  Shoulder    Pain Orientation  Right    Pain Descriptors / Indicators  Aching;Sore    Pain Type  Chronic pain    Pain Onset  More than a month ago    Pain Frequency  Intermittent    Aggravating Factors   any shoulder use         [facility] PT Assessment - 04/11/19 1054      Assessment   Medical Diagnosis  Severe Aortic Stenosis    Referring Provider (PT)  Lauree Chandler MD    Onset Date/Surgical Date  --   6 months   Hand Dominance  Right      Precautions   Precautions  None      Restrictions   Weight Bearing Restrictions  No      Balance Screen   Has the patient fallen in the past 6 months  No    Has the patient had a decrease in activity level because  of a fear of falling?   No    Is the patient reluctant to leave their home because of a fear of falling?   No      Home Film/video editor residence    Living Arrangements  Spouse/significant other    Available Help at Discharge  Family    Type of [place] to enter    Entrance Stairs-Number of Steps  [place]  One level    [place] - 2 wheels;Cane - single point      Haematologist  Postural limitations    Postural Limitations  Rounded Shoulders;Forward head      ROM / Strength   AROM / PROM / Strength  AROM;Strength      AROM   Overall AROM Comments  functional L shoulder ROM, significant limtation of the R shoulder in all planes      Strength   Overall Strength  Deficits    Overall Strength Comments  R shoulder significant weakness based on pain and limtied ROM, L ankle DF 3+/5,  hip/knee strength grossly 4/5    Right Hand Grip (lbs)  30    Left Hand Grip (lbs)  41      Ambulation/Gait   Ambulation/Gait  Yes    Gait Pattern  Step-through pattern;Decreased stride length;Trendelenburg;Poor foot clearance - left;Trunk flexed;Antalgic    Gait Comments  pt ambulated 215 ft in 1:25 requiring rest break lasting 1 min, HR 75 O2 96%, he was able to ambulate 155 ft requiring rest break lasting 1:45 HR 75 and 02 96, He walked and additional 60 ft in the remaining time        [facility] Pre-Surgical Assessment - 04/11/19 0001    5 Meter Walk Test- trial 1  3 sec    5 Meter Walk Test- trial 2  3 sec.     5 Meter Walk Test- trial 3  3 sec.    5 meter walk test average  3 sec    4 Stage Balance Test tolerated for:   10 sec.    4 Stage Balance Test Position  4    ADL/IADL Independent with:  Bathing;Dressing;Meal prep;Finances    ADL/IADL Needs Assistance with:  Valla Leaver work    ADL/IADL Therapist, sports Index  Midly frail    Other comment  pt reported significant  fatigue with balance assessment    6 Minute Walk- Baseline  yes    BP (mmHg)  118/78    HR (bpm)  61    02 Sat (%RA)  99 %    Modified Borg Scale for Dyspnea  3- Moderate shortness of breath or breathing difficulty    Perceived Rate of Exertion (Borg)  13- Somewhat hard    6 Minute Walk Post Test  yes    BP (mmHg)  138/60    HR (bpm)  75    02 Sat (%RA)  96 %    Modified Borg Scale for Dyspnea  9- Extremely severe    Perceived Rate of Exertion (Borg)  18-              Objective measurements completed on examination: See above findings.                           Plan - 04/11/19 1201    Clinical Impression Statement  see assessment in note    Stability/Clinical Decision Making  Stable/Uncomplicated    Clinical Decision Making  Low    PT Frequency  One time visit    PT Next Visit Plan  pre-TAVR evaluation      Clinical Impression Statement: Pt is a 84 yo M presenting to OP PT for evaluation prior to possible TAVR surgery due to severe aortic stenosis. Pt reports onset of general fa tigue and SOB approximately 6 months ago. Symptoms are limiting endurance. Pt presents with  functional ROM except for limited R shoulder ROM and strength except in the R shoulder and mild L dorsiflexor weakness, good balance and is assessed as  low at high fall risk 4 stage balance test, good  walking speed and significantly limited aerobic endurance per 6 minute walk test. Pt ambulated 215 ft in 1:25 requiring rest break lasting 1 min, HR 75 O2 96%, he was able to ambulate 155 ft requiring rest break lasting 1:45 HR 75 and 02 96, He walked and additional 60 ft in the remaining time.  Pt reported 9/10 shortness of breath on modified scale for dyspnea. Pt ambulated a total of 460 feet in 6 minute walk. SOB, fatigue and postural sway increased significantly with 6 minute walk test. Based on the Short Physical Performance Battery, patient has a frailty rating of 8/12 with </= 5/12  considered frail.    Patient demonstrated the following deficits and impairments:     Visit Diagnosis: 1. Other abnormalities of gait and mobility   2. Muscle weakness (generalized)        Problem List Patient Active Problem List   Diagnosis Date Noted  . Prosthetic valve dysfunction   . Severe aortic regurgitation 04/02/2019  . Severe aortic insufficiency 03/24/2019  . SOB (shortness of breath) 03/24/2019  . Lower leg edema 03/24/2019  . Chronic diastolic CHF (congestive heart failure) ([place]) 03/24/2019  . Infection of shoulder ([place]) 11/20/2018  . S/P shoulder replacement, right 03/01/2018  . Mitral regurgitation 05/16/2016  . Postoperative atrial fibrillation ([place]) 05/10/2015  . Coronary artery disease, non-occlusive   . Abnormal CT scan, esophagus 02/25/2014  . Microcytic anemia 02/25/2014  . Esophageal reflux 02/25/2014  . Other dysphagia 02/25/2014  . Bronchiectasis without acute exacerbation ([place]) 02/18/2014  . Abnormal echocardiogram 01/29/2014  . S/P aortic valve replacement with bioprosthetic valve 01/16/2008   Starr Lake PT, DPT, LAT, ATC  04/11/19  12:10 PM      [facility] [address] [place], [place], [address] Phone: [phone]   Fax:  [phone]  Name: ARLON BLEIER MRN: [id number] Date of Birth: November 27, 1934

## 2019-04-12 LAB — SARS CORONAVIRUS 2 (TAT 6-24 HRS): SARS Coronavirus 2: NEGATIVE

## 2019-04-12 LAB — HAPTOGLOBIN: Haptoglobin: <10 mg/dL — ABNORMAL LOW (ref 38–329)

## 2019-04-14 ENCOUNTER — Encounter: Payer: Medicare Other | Admitting: Thoracic Surgery (Cardiothoracic Vascular Surgery)

## 2019-04-14 MED ORDER — SODIUM CHLORIDE 0.9 % IV SOLN
INTRAVENOUS | Status: DC
  Filled 2019-04-14: qty 30

## 2019-04-14 MED ORDER — POTASSIUM CHLORIDE 2 MEQ/ML IV SOLN
80.0000 meq | INTRAVENOUS | Status: DC
  Filled 2019-04-14: qty 40

## 2019-04-14 MED ORDER — VANCOMYCIN HCL IN DEXTROSE 1-5 GM/200ML-% IV SOLN
1000.0000 mg | INTRAVENOUS | Status: AC
  Administered 2019-04-15: 08:00:00 1000 mg via INTRAVENOUS
  Filled 2019-04-14 (×2): qty 200

## 2019-04-14 MED ORDER — SODIUM CHLORIDE 0.9 % IV SOLN
1.5000 g | INTRAVENOUS | Status: AC
  Administered 2019-04-15: 1.5 g via INTRAVENOUS
  Filled 2019-04-14: qty 1.5

## 2019-04-14 MED ORDER — DEXMEDETOMIDINE HCL IN NACL 400 MCG/100ML IV SOLN
0.1000 ug/kg/h | INTRAVENOUS | Status: AC
  Administered 2019-04-15: 1 ug/kg/h via INTRAVENOUS
  Filled 2019-04-14: qty 100

## 2019-04-14 MED ORDER — NOREPINEPHRINE 4 MG/250ML-% IV SOLN
0.0000 ug/min | INTRAVENOUS | Status: AC
  Administered 2019-04-15: 2 ug/min via INTRAVENOUS
  Filled 2019-04-14: qty 250

## 2019-04-14 MED ORDER — MAGNESIUM SULFATE 50 % IJ SOLN
40.0000 meq | INTRAMUSCULAR | Status: DC
  Filled 2019-04-14: qty 9.85

## 2019-04-14 NOTE — Anesthesia Preprocedure Evaluation (Addendum)
Anesthesia Evaluation  Patient identified by MRN, date of birth, ID band Patient awake    Reviewed: Allergy & Precautions, NPO status , Patient's Chart, lab work & pertinent test results  Airway Mallampati: II  TM Distance: >3 FB Neck ROM: Full    Dental  (+) Dental Advisory Given   Pulmonary former smoker,    breath sounds clear to auscultation       Cardiovascular hypertension, Pt. on medications and Pt. on home beta blockers + CAD (non-obstructive) and +CHF  + dysrhythmias Atrial Fibrillation + Valvular Problems/Murmurs AI and MR  Rhythm:Irregular Rate:Normal  03/18/2019 ECHO: LV normal systolic function, EF 80-88%. The cavity size was normal. Left ventricular diastolic Doppler parameters are consistent with restrictive filling. Elevated left atrial and left ventricular  end-diastolic pressures  RV mildly reduced systolic function. Right ventricular systolic pressure is severely elevated with an estimated pressure of 64.2 mmHg. Mod MR, Mod TR, S/P AVR with a 25 mm Edwards bioprosthetic valve. Aortic valve regurgitation is moderate by color flow Doppler. No stenosis of the aortic valve.   Neuro/Psych negative neurological ROS  negative psych ROS   GI/Hepatic Neg liver ROS, GERD (esophageal stricture)  Medicated,  Endo/Other  negative endocrine ROS  Renal/GU Renal InsufficiencyRenal disease (creat 1.30)     Musculoskeletal negative musculoskeletal ROS (+) Arthritis , Rheumatoid disorders and steroids,    Abdominal   Peds  Hematology negative hematology ROS (+) Blood dyscrasia (Hb 7.8), anemia ,   Anesthesia Other Findings   Reproductive/Obstetrics                            Anesthesia Physical  Anesthesia Plan  ASA: IV  Anesthesia Plan: MAC   Post-op Pain Management:    Induction: Intravenous  PONV Risk Score and Plan: 2 and Treatment may vary due to age or medical condition,  Ondansetron and Propofol infusion  Airway Management Planned: Natural Airway  Additional Equipment: Arterial line  Intra-op Plan:   Post-operative Plan:   Informed Consent: I have reviewed the patients History and Physical, chart, labs and discussed the procedure including the risks, benefits and alternatives for the proposed anesthesia with the patient or authorized representative who has indicated his/her understanding and acceptance.     Dental advisory given  Plan Discussed with: CRNA  Anesthesia Plan Comments: (Severe AS s/p AVR 2009 - Now with severe AI due to valve failure.  Angiographically normal coronaries by cath 04/02/19.  Anemia noted on preop labs - Hgb 7.0. Dr. Burt Knack aware. Per his notes likely related to valve failure/hemolysis.  )       Anesthesia Quick Evaluation

## 2019-04-15 ENCOUNTER — Inpatient Hospital Stay (HOSPITAL_COMMUNITY): Payer: Medicare Other | Admitting: Anesthesiology

## 2019-04-15 ENCOUNTER — Other Ambulatory Visit: Payer: Self-pay

## 2019-04-15 ENCOUNTER — Ambulatory Visit (HOSPITAL_COMMUNITY): Payer: Medicare Other

## 2019-04-15 ENCOUNTER — Inpatient Hospital Stay (HOSPITAL_COMMUNITY)
Admission: RE | Admit: 2019-04-15 | Discharge: 2019-04-16 | DRG: 266 | Disposition: A | Discharge status: Home / Self Care | Payer: Medicare Other | Attending: Thoracic Surgery (Cardiothoracic Vascular Surgery) | Admitting: Thoracic Surgery (Cardiothoracic Vascular Surgery)

## 2019-04-15 ENCOUNTER — Encounter (HOSPITAL_COMMUNITY)
Admission: RE | Disposition: A | Discharge status: Home / Self Care | Payer: Self-pay | Source: Home / Self Care | Attending: Thoracic Surgery (Cardiothoracic Vascular Surgery)

## 2019-04-15 ENCOUNTER — Encounter (HOSPITAL_COMMUNITY): Payer: Self-pay | Admitting: *Deleted

## 2019-04-15 ENCOUNTER — Inpatient Hospital Stay (HOSPITAL_COMMUNITY): Payer: Medicare Other | Admitting: Vascular Surgery

## 2019-04-15 ENCOUNTER — Inpatient Hospital Stay (HOSPITAL_COMMUNITY): Payer: Medicare Other

## 2019-04-15 ENCOUNTER — Other Ambulatory Visit: Payer: Self-pay | Admitting: Physician Assistant

## 2019-04-15 DIAGNOSIS — M069 Rheumatoid arthritis, unspecified: Secondary | ICD-10-CM | POA: Diagnosis present

## 2019-04-15 DIAGNOSIS — Z961 Presence of intraocular lens: Secondary | ICD-10-CM | POA: Diagnosis present

## 2019-04-15 DIAGNOSIS — I371 Nonrheumatic pulmonary valve insufficiency: Secondary | ICD-10-CM | POA: Diagnosis present

## 2019-04-15 DIAGNOSIS — I351 Nonrheumatic aortic (valve) insufficiency: Secondary | ICD-10-CM

## 2019-04-15 DIAGNOSIS — I110 Hypertensive heart disease with heart failure: Secondary | ICD-10-CM | POA: Diagnosis present

## 2019-04-15 DIAGNOSIS — Z9842 Cataract extraction status, left eye: Secondary | ICD-10-CM

## 2019-04-15 DIAGNOSIS — I340 Nonrheumatic mitral (valve) insufficiency: Secondary | ICD-10-CM | POA: Diagnosis not present

## 2019-04-15 DIAGNOSIS — I272 Pulmonary hypertension, unspecified: Secondary | ICD-10-CM | POA: Diagnosis present

## 2019-04-15 DIAGNOSIS — D589 Hereditary hemolytic anemia, unspecified: Secondary | ICD-10-CM | POA: Diagnosis present

## 2019-04-15 DIAGNOSIS — Z87442 Personal history of urinary calculi: Secondary | ICD-10-CM | POA: Diagnosis not present

## 2019-04-15 DIAGNOSIS — K219 Gastro-esophageal reflux disease without esophagitis: Secondary | ICD-10-CM | POA: Diagnosis present

## 2019-04-15 DIAGNOSIS — I480 Paroxysmal atrial fibrillation: Secondary | ICD-10-CM | POA: Diagnosis present

## 2019-04-15 DIAGNOSIS — I9789 Other postprocedural complications and disorders of the circulatory system, not elsewhere classified: Secondary | ICD-10-CM | POA: Diagnosis present

## 2019-04-15 DIAGNOSIS — I4891 Unspecified atrial fibrillation: Secondary | ICD-10-CM | POA: Diagnosis present

## 2019-04-15 DIAGNOSIS — Z952 Presence of prosthetic heart valve: Secondary | ICD-10-CM

## 2019-04-15 DIAGNOSIS — Z96611 Presence of right artificial shoulder joint: Secondary | ICD-10-CM | POA: Diagnosis present

## 2019-04-15 DIAGNOSIS — I5032 Chronic diastolic (congestive) heart failure: Secondary | ICD-10-CM | POA: Diagnosis present

## 2019-04-15 DIAGNOSIS — Z8261 Family history of arthritis: Secondary | ICD-10-CM | POA: Diagnosis not present

## 2019-04-15 DIAGNOSIS — I5033 Acute on chronic diastolic (congestive) heart failure: Secondary | ICD-10-CM | POA: Diagnosis present

## 2019-04-15 DIAGNOSIS — D696 Thrombocytopenia, unspecified: Secondary | ICD-10-CM | POA: Diagnosis present

## 2019-04-15 DIAGNOSIS — T82897A Other specified complication of cardiac prosthetic devices, implants and grafts, initial encounter: Secondary | ICD-10-CM | POA: Diagnosis present

## 2019-04-15 DIAGNOSIS — Z79899 Other long term (current) drug therapy: Secondary | ICD-10-CM

## 2019-04-15 DIAGNOSIS — Z7982 Long term (current) use of aspirin: Secondary | ICD-10-CM

## 2019-04-15 DIAGNOSIS — I10 Essential (primary) hypertension: Secondary | ICD-10-CM | POA: Diagnosis present

## 2019-04-15 DIAGNOSIS — Y831 Surgical operation with implant of artificial internal device as the cause of abnormal reaction of the patient, or of later complication, without mention of misadventure at the time of the procedure: Secondary | ICD-10-CM | POA: Diagnosis present

## 2019-04-15 DIAGNOSIS — I472 Ventricular tachycardia: Secondary | ICD-10-CM | POA: Diagnosis present

## 2019-04-15 DIAGNOSIS — I350 Nonrheumatic aortic (valve) stenosis: Secondary | ICD-10-CM

## 2019-04-15 DIAGNOSIS — Z86010 Personal history of colonic polyps: Secondary | ICD-10-CM

## 2019-04-15 DIAGNOSIS — Z87891 Personal history of nicotine dependence: Secondary | ICD-10-CM | POA: Diagnosis not present

## 2019-04-15 DIAGNOSIS — I361 Nonrheumatic tricuspid (valve) insufficiency: Secondary | ICD-10-CM | POA: Diagnosis not present

## 2019-04-15 DIAGNOSIS — Z7952 Long term (current) use of systemic steroids: Secondary | ICD-10-CM | POA: Diagnosis not present

## 2019-04-15 DIAGNOSIS — I083 Combined rheumatic disorders of mitral, aortic and tricuspid valves: Principal | ICD-10-CM | POA: Diagnosis present

## 2019-04-15 DIAGNOSIS — Z8249 Family history of ischemic heart disease and other diseases of the circulatory system: Secondary | ICD-10-CM | POA: Diagnosis not present

## 2019-04-15 DIAGNOSIS — Z9841 Cataract extraction status, right eye: Secondary | ICD-10-CM

## 2019-04-15 DIAGNOSIS — Z006 Encounter for examination for normal comparison and control in clinical research program: Secondary | ICD-10-CM

## 2019-04-15 DIAGNOSIS — T8209XA Other mechanical complication of heart valve prosthesis, initial encounter: Secondary | ICD-10-CM | POA: Diagnosis present

## 2019-04-15 DIAGNOSIS — R0602 Shortness of breath: Secondary | ICD-10-CM | POA: Diagnosis present

## 2019-04-15 DIAGNOSIS — I5042 Chronic combined systolic (congestive) and diastolic (congestive) heart failure: Secondary | ICD-10-CM | POA: Diagnosis present

## 2019-04-15 DIAGNOSIS — Z953 Presence of xenogenic heart valve: Secondary | ICD-10-CM

## 2019-04-15 HISTORY — PX: TRANSCATHETER AORTIC VALVE REPLACEMENT, TRANSFEMORAL: SHX6400

## 2019-04-15 HISTORY — PX: INTRAOPERATIVE TRANSTHORACIC ECHOCARDIOGRAM: SHX6523

## 2019-04-15 HISTORY — DX: Presence of prosthetic heart valve: Z95.2

## 2019-04-15 HISTORY — DX: Hereditary hemolytic anemia, unspecified: D58.9

## 2019-04-15 LAB — POCT I-STAT 4, (NA,K, GLUC, HGB,HCT)
Glucose, Bld: 94 mg/dL (ref 70–99)
Glucose, Bld: 117 mg/dL — ABNORMAL HIGH (ref 70–99)
Glucose, Bld: 122 mg/dL — ABNORMAL HIGH (ref 70–99)
HCT: 21 % — ABNORMAL LOW (ref 39.0–52.0)
HCT: 20 % — ABNORMAL LOW (ref 39.0–52.0)
HCT: 22 % — ABNORMAL LOW (ref 39.0–52.0)
Hemoglobin: 7.1 g/dL — ABNORMAL LOW (ref 13.0–17.0)
Hemoglobin: 6.8 g/dL — CL (ref 13.0–17.0)
Hemoglobin: 7.5 g/dL — ABNORMAL LOW (ref 13.0–17.0)
Potassium: 4.6 mmol/L (ref 3.5–5.1)
Potassium: 3.9 mmol/L (ref 3.5–5.1)
Potassium: 5.5 mmol/L — ABNORMAL HIGH (ref 3.5–5.1)
Sodium: 142 mmol/L (ref 135–145)
Sodium: 140 mmol/L (ref 135–145)
Sodium: 142 mmol/L (ref 135–145)

## 2019-04-15 LAB — POCT I-STAT, CHEM 8
BUN: 16 mg/dL (ref 8–23)
Calcium, Ion: 0.95 mmol/L — ABNORMAL LOW (ref 1.15–1.40)
Chloride: 108 mmol/L (ref 98–111)
Creatinine, Ser: 0.9 mg/dL (ref 0.61–1.24)
Glucose, Bld: 110 mg/dL — ABNORMAL HIGH (ref 70–99)
HCT: 22 % — ABNORMAL LOW (ref 39.0–52.0)
Hemoglobin: 7.5 g/dL — ABNORMAL LOW (ref 13.0–17.0)
Potassium: 4 mmol/L (ref 3.5–5.1)
Sodium: 140 mmol/L (ref 135–145)
TCO2: 25 mmol/L (ref 22–32)

## 2019-04-15 LAB — BLOOD GAS, ARTERIAL
Acid-Base Excess: 0.7 mmol/L (ref 0.0–2.0)
Bicarbonate: 24.6 mmol/L (ref 20.0–28.0)
FIO2: 21
O2 Saturation: 95.6 %
Patient temperature: 98.6
pCO2 arterial: 38.1 mmHg (ref 32.0–48.0)
pH, Arterial: 7.426 (ref 7.350–7.450)
pO2, Arterial: 91.2 mmHg (ref 83.0–108.0)

## 2019-04-15 LAB — PREPARE RBC (CROSSMATCH)

## 2019-04-15 LAB — POCT I-STAT 7, (LYTES, BLD GAS, ICA,H+H)
Acid-Base Excess: 3 mmol/L — ABNORMAL HIGH (ref 0.0–2.0)
Bicarbonate: 28.1 mmol/L — ABNORMAL HIGH (ref 20.0–28.0)
Calcium, Ion: 0.92 mmol/L — ABNORMAL LOW (ref 1.15–1.40)
HCT: 23 % — ABNORMAL LOW (ref 39.0–52.0)
Hemoglobin: 7.8 g/dL — ABNORMAL LOW (ref 13.0–17.0)
O2 Saturation: 100 %
Potassium: 4.8 mmol/L (ref 3.5–5.1)
Sodium: 141 mmol/L (ref 135–145)
TCO2: 29 mmol/L (ref 22–32)
pCO2 arterial: 44.6 mmHg (ref 32.0–48.0)
pH, Arterial: 7.407 (ref 7.350–7.450)
pO2, Arterial: 195 mmHg — ABNORMAL HIGH (ref 83.0–108.0)

## 2019-04-15 SURGERY — IMPLANTATION, AORTIC VALVE, TRANSCATHETER, FEMORAL APPROACH
Anesthesia: Monitor Anesthesia Care

## 2019-04-15 MED ORDER — PANTOPRAZOLE SODIUM 40 MG PO TBEC
40.0000 mg | DELAYED_RELEASE_TABLET | Freq: Every day | ORAL | Status: DC
  Administered 2019-04-16: 07:00:00 40 mg via ORAL
  Filled 2019-04-15 (×2): qty 1

## 2019-04-15 MED ORDER — SODIUM CHLORIDE 0.9% IV SOLUTION: Freq: Once | INTRAVENOUS | Status: DC

## 2019-04-15 MED ORDER — MIDAZOLAM HCL 2 MG/2ML IJ SOLN
INTRAMUSCULAR | Status: DC | PRN
  Administered 2019-04-15 (×2): 0.5 mg via INTRAVENOUS

## 2019-04-15 MED ORDER — TAMSULOSIN HCL 0.4 MG PO CAPS
0.8000 mg | ORAL_CAPSULE | Freq: Every day | ORAL | Status: DC
  Administered 2019-04-16: 11:00:00 0.8 mg via ORAL
  Filled 2019-04-15: qty 2

## 2019-04-15 MED ORDER — NITROGLYCERIN IN D5W 200-5 MCG/ML-% IV SOLN: 0.0000 ug/min | INTRAVENOUS | Status: DC

## 2019-04-15 MED ORDER — LIDOCAINE HCL 1 % IJ SOLN
INTRAMUSCULAR | Status: DC | PRN
  Administered 2019-04-15: 5 mL

## 2019-04-15 MED ORDER — HEPARIN SODIUM (PORCINE) 1000 UNIT/ML IJ SOLN
INTRAMUSCULAR | Status: AC
  Filled 2019-04-15: qty 1

## 2019-04-15 MED ORDER — LEFLUNOMIDE 20 MG PO TABS
20.0000 mg | ORAL_TABLET | Freq: Every day | ORAL | Status: DC
  Administered 2019-04-15: 20 mg via ORAL
  Filled 2019-04-15 (×2): qty 1

## 2019-04-15 MED ORDER — EPHEDRINE SULFATE-NACL 50-0.9 MG/10ML-% IV SOSY
PREFILLED_SYRINGE | INTRAVENOUS | Status: DC | PRN
  Administered 2019-04-15: 5 mg via INTRAVENOUS

## 2019-04-15 MED ORDER — SODIUM CHLORIDE 0.9 % IV SOLN: INTRAVENOUS | Status: DC

## 2019-04-15 MED ORDER — LACTATED RINGERS IV SOLN
INTRAVENOUS | Status: DC | PRN
  Administered 2019-04-15: 07:00:00 via INTRAVENOUS

## 2019-04-15 MED ORDER — CHLORHEXIDINE GLUCONATE 4 % EX LIQD: 30.0000 mL | CUTANEOUS | Status: DC

## 2019-04-15 MED ORDER — ACETAMINOPHEN 325 MG PO TABS: 650.0000 mg | ORAL_TABLET | Freq: Four times a day (QID) | ORAL | Status: DC | PRN

## 2019-04-15 MED ORDER — VANCOMYCIN HCL IN DEXTROSE 1-5 GM/200ML-% IV SOLN: 1000.0000 mg | Freq: Once | INTRAVENOUS | Status: DC

## 2019-04-15 MED ORDER — ACETAMINOPHEN 650 MG RE SUPP: 650.0000 mg | Freq: Four times a day (QID) | RECTAL | Status: DC | PRN

## 2019-04-15 MED ORDER — DEXMEDETOMIDINE HCL IN NACL 200 MCG/50ML IV SOLN
INTRAVENOUS | Status: DC | PRN
  Administered 2019-04-15: 47.6 ug via INTRAVENOUS

## 2019-04-15 MED ORDER — CHLORHEXIDINE GLUCONATE 4 % EX LIQD: 60.0000 mL | Freq: Once | CUTANEOUS | Status: DC

## 2019-04-15 MED ORDER — EPHEDRINE 5 MG/ML INJ
INTRAVENOUS | Status: AC
  Filled 2019-04-15: qty 10

## 2019-04-15 MED ORDER — CHLORHEXIDINE GLUCONATE 0.12 % MT SOLN
15.0000 mL | Freq: Once | OROMUCOSAL | Status: AC
  Administered 2019-04-15: 15 mL via OROMUCOSAL
  Filled 2019-04-15: qty 15

## 2019-04-15 MED ORDER — TRAMADOL HCL 50 MG PO TABS
50.0000 mg | ORAL_TABLET | ORAL | Status: DC | PRN
  Administered 2019-04-15: 50 mg via ORAL
  Filled 2019-04-15: qty 1

## 2019-04-15 MED ORDER — SODIUM CHLORIDE 0.9 % IV SOLN: 250.0000 mL | INTRAVENOUS | Status: DC | PRN

## 2019-04-15 MED ORDER — HEPARIN SODIUM (PORCINE) 1000 UNIT/ML IJ SOLN
INTRAMUSCULAR | Status: DC | PRN
  Administered 2019-04-15: 7000 [IU] via INTRAVENOUS

## 2019-04-15 MED ORDER — ACETAMINOPHEN 500 MG PO TABS: 1000.0000 mg | ORAL_TABLET | Freq: Four times a day (QID) | ORAL | Status: DC | PRN

## 2019-04-15 MED ORDER — MIDAZOLAM HCL 2 MG/2ML IJ SOLN
INTRAMUSCULAR | Status: AC
  Filled 2019-04-15: qty 2

## 2019-04-15 MED ORDER — SODIUM CHLORIDE 0.9 % IV SOLN
INTRAVENOUS | Status: DC | PRN
  Administered 2019-04-15: 1500 mL

## 2019-04-15 MED ORDER — SODIUM CHLORIDE 0.9 % IV SOLN
INTRAVENOUS | Status: DC | PRN
  Administered 2019-04-15: 09:00:00 via INTRAVENOUS

## 2019-04-15 MED ORDER — OXYCODONE HCL 5 MG PO TABS: 5.0000 mg | ORAL_TABLET | ORAL | Status: DC | PRN

## 2019-04-15 MED ORDER — IODIXANOL 320 MG/ML IV SOLN: INTRAVENOUS | Status: DC | PRN

## 2019-04-15 MED ORDER — ONDANSETRON HCL 4 MG/2ML IJ SOLN
INTRAMUSCULAR | Status: DC | PRN
  Administered 2019-04-15: 4 mg via INTRAVENOUS

## 2019-04-15 MED ORDER — PROPOFOL 500 MG/50ML IV EMUL
INTRAVENOUS | Status: DC | PRN
  Administered 2019-04-15: 10 ug/kg/min via INTRAVENOUS

## 2019-04-15 MED ORDER — ASPIRIN 81 MG PO TBEC
81.0000 mg | DELAYED_RELEASE_TABLET | Freq: Every day | ORAL | Status: DC
  Filled 2019-04-15: qty 1

## 2019-04-15 MED ORDER — PROPOFOL 10 MG/ML IV BOLUS
INTRAVENOUS | Status: AC
  Filled 2019-04-15: qty 40

## 2019-04-15 MED ORDER — SODIUM CHLORIDE 0.9 % IV SOLN
1.5000 g | Freq: Two times a day (BID) | INTRAVENOUS | Status: DC
  Administered 2019-04-15: 20:00:00 1.5 g via INTRAVENOUS
  Filled 2019-04-15 (×3): qty 1.5

## 2019-04-15 MED ORDER — FENTANYL CITRATE (PF) 250 MCG/5ML IJ SOLN
INTRAMUSCULAR | Status: AC
  Filled 2019-04-15: qty 5

## 2019-04-15 MED ORDER — PROTAMINE SULFATE 10 MG/ML IV SOLN
INTRAVENOUS | Status: DC | PRN
  Administered 2019-04-15 (×2): 10 mg via INTRAVENOUS
  Administered 2019-04-15: 30 mg via INTRAVENOUS
  Administered 2019-04-15: 20 mg via INTRAVENOUS

## 2019-04-15 MED ORDER — PROTAMINE SULFATE 10 MG/ML IV SOLN
INTRAVENOUS | Status: AC
  Filled 2019-04-15: qty 25

## 2019-04-15 MED ORDER — PHENYLEPHRINE HCL-NACL 20-0.9 MG/250ML-% IV SOLN
0.0000 ug/min | INTRAVENOUS | Status: DC
  Filled 2019-04-15: qty 250

## 2019-04-15 MED ORDER — ONDANSETRON HCL 4 MG/2ML IJ SOLN
INTRAMUSCULAR | Status: AC
  Filled 2019-04-15: qty 2

## 2019-04-15 MED ORDER — METHOTREXATE 2.5 MG PO TABS: 15.0000 mg | ORAL_TABLET | ORAL | Status: DC

## 2019-04-15 MED ORDER — SODIUM CHLORIDE 0.9% FLUSH: 3.0000 mL | INTRAVENOUS | Status: DC | PRN

## 2019-04-15 MED ORDER — LIDOCAINE 2% (20 MG/ML) 5 ML SYRINGE
INTRAMUSCULAR | Status: AC
  Filled 2019-04-15: qty 5

## 2019-04-15 MED ORDER — SODIUM CHLORIDE 0.9 % IV SOLN: INTRAVENOUS | Status: AC

## 2019-04-15 MED ORDER — PREDNISONE 5 MG PO TABS
5.0000 mg | ORAL_TABLET | Freq: Every day | ORAL | Status: DC
  Administered 2019-04-16: 07:00:00 5 mg via ORAL
  Filled 2019-04-15 (×2): qty 1

## 2019-04-15 MED ORDER — SODIUM CHLORIDE 0.9 % IV SOLN
INTRAVENOUS | Status: AC
  Filled 2019-04-15 (×3): qty 1.2

## 2019-04-15 MED ORDER — PROPOFOL 1000 MG/100ML IV EMUL
INTRAVENOUS | Status: AC
  Filled 2019-04-15: qty 100

## 2019-04-15 MED ORDER — MORPHINE SULFATE (PF) 2 MG/ML IV SOLN: 1.0000 mg | INTRAVENOUS | Status: DC | PRN

## 2019-04-15 MED ORDER — CLOPIDOGREL BISULFATE 75 MG PO TABS
75.0000 mg | ORAL_TABLET | Freq: Every day | ORAL | Status: DC
  Administered 2019-04-16: 75 mg via ORAL
  Filled 2019-04-15: qty 1

## 2019-04-15 MED ORDER — FENTANYL CITRATE (PF) 100 MCG/2ML IJ SOLN
INTRAMUSCULAR | Status: DC | PRN
  Administered 2019-04-15: 25 ug via INTRAVENOUS

## 2019-04-15 MED ORDER — ONDANSETRON HCL 4 MG/2ML IJ SOLN: 4.0000 mg | Freq: Four times a day (QID) | INTRAMUSCULAR | Status: DC | PRN

## 2019-04-15 MED ORDER — SODIUM CHLORIDE 0.9% FLUSH: 3.0000 mL | Freq: Two times a day (BID) | INTRAVENOUS | Status: DC

## 2019-04-15 MED ORDER — FUROSEMIDE 10 MG/ML IJ SOLN
60.0000 mg | Freq: Once | INTRAMUSCULAR | Status: AC
  Administered 2019-04-15: 16:00:00 60 mg via INTRAVENOUS
  Filled 2019-04-15: qty 6

## 2019-04-15 MED ORDER — ASPIRIN EC 81 MG PO TBEC
81.0000 mg | DELAYED_RELEASE_TABLET | Freq: Every day | ORAL | Status: DC
  Administered 2019-04-16: 11:00:00 81 mg via ORAL
  Filled 2019-04-15: qty 1

## 2019-04-15 MED ORDER — LIDOCAINE HCL (PF) 1 % IJ SOLN
INTRAMUSCULAR | Status: AC
  Filled 2019-04-15: qty 30

## 2019-04-15 SURGICAL SUPPLY — 84 items
BAG DECANTER FOR FLEXI CONT (MISCELLANEOUS) IMPLANT
BAG SNAP BAND KOVER 36X36 (MISCELLANEOUS) ×3 IMPLANT
BALLN TRUE 26X4.5 (BALLOONS) ×2
BALLN TRUE 26X4.5CM (BALLOONS) ×1
BALLOON TRUE 26X4.5 (BALLOONS) ×1 IMPLANT
BLADE CLIPPER SURG (BLADE) ×3 IMPLANT
BLADE STERNUM SYSTEM 6 (BLADE) IMPLANT
BLADE SURG 10 STRL SS (BLADE) IMPLANT
CABLE ADAPT CONN TEMP 6FT (ADAPTER) ×3 IMPLANT
CANISTER SUCT 3000ML PPV (MISCELLANEOUS) IMPLANT
CATH DIAG EXPO 6F AL1 (CATHETERS) IMPLANT
CATH DIAG EXPO 6F VENT PIG 145 (CATHETERS) ×6 IMPLANT
CATH EXTERNAL FEMALE PUREWICK (CATHETERS) IMPLANT
CATH INFINITI 6F AL2 (CATHETERS) ×3 IMPLANT
CATH S G BIP PACING (CATHETERS) ×3 IMPLANT
CHLORAPREP W/TINT 26 (MISCELLANEOUS) ×3 IMPLANT
CLIP VESOCCLUDE MED 24/CT (CLIP) IMPLANT
CLIP VESOCCLUDE SM WIDE 24/CT (CLIP) IMPLANT
CONT SPEC 4OZ CLIKSEAL STRL BL (MISCELLANEOUS) ×6 IMPLANT
COVER BACK TABLE 80X110 HD (DRAPES) IMPLANT
COVER WAND RF STERILE (DRAPES) ×3 IMPLANT
DECANTER SPIKE VIAL GLASS SM (MISCELLANEOUS) ×6 IMPLANT
DERMABOND ADVANCED (GAUZE/BANDAGES/DRESSINGS) ×2
DERMABOND ADVANCED .7 DNX12 (GAUZE/BANDAGES/DRESSINGS) ×1 IMPLANT
DEVICE CLOSURE PERCLS PRGLD 6F (VASCULAR PRODUCTS) ×2 IMPLANT
DRAPE INCISE IOBAN 66X45 STRL (DRAPES) IMPLANT
DRSG TEGADERM 4X4.75 (GAUZE/BANDAGES/DRESSINGS) ×3 IMPLANT
ELECT CAUTERY BLADE 6.4 (BLADE) IMPLANT
ELECT REM PT RETURN 9FT ADLT (ELECTROSURGICAL) ×6
ELECTRODE REM PT RTRN 9FT ADLT (ELECTROSURGICAL) ×2 IMPLANT
FELT TEFLON 6X6 (MISCELLANEOUS) ×3 IMPLANT
GAUZE SPONGE 4X4 12PLY STRL (GAUZE/BANDAGES/DRESSINGS) ×3 IMPLANT
GLOVE BIO SURGEON STRL SZ7.5 (GLOVE) IMPLANT
GLOVE BIO SURGEON STRL SZ8 (GLOVE) ×3 IMPLANT
GLOVE EUDERMIC 7 POWDERFREE (GLOVE) IMPLANT
GLOVE ORTHO TXT STRL SZ7.5 (GLOVE) ×3 IMPLANT
GOWN STRL REUS W/ TWL LRG LVL3 (GOWN DISPOSABLE) IMPLANT
GOWN STRL REUS W/ TWL XL LVL3 (GOWN DISPOSABLE) ×1 IMPLANT
GOWN STRL REUS W/TWL LRG LVL3 (GOWN DISPOSABLE)
GOWN STRL REUS W/TWL XL LVL3 (GOWN DISPOSABLE) ×2
GUIDEWIRE SAF TJ AMPL .035X180 (WIRE) ×3 IMPLANT
GUIDEWIRE SAFE TJ AMPLATZ EXST (WIRE) ×3 IMPLANT
INSERT FOGARTY SM (MISCELLANEOUS) IMPLANT
KIT BASIN OR (CUSTOM PROCEDURE TRAY) ×3 IMPLANT
KIT HEART LEFT (KITS) ×3 IMPLANT
KIT SUCTION CATH 14FR (SUCTIONS) IMPLANT
KIT TURNOVER KIT B (KITS) ×3 IMPLANT
LOOP VESSEL MAXI BLUE (MISCELLANEOUS) IMPLANT
LOOP VESSEL MINI RED (MISCELLANEOUS) IMPLANT
NS IRRIG 1000ML POUR BTL (IV SOLUTION) ×3 IMPLANT
PACK ENDO MINOR (CUSTOM PROCEDURE TRAY) ×3 IMPLANT
PAD ARMBOARD 7.5X6 YLW CONV (MISCELLANEOUS) ×6 IMPLANT
PAD ELECT DEFIB RADIOL ZOLL (MISCELLANEOUS) ×3 IMPLANT
PENCIL BUTTON HOLSTER BLD 10FT (ELECTRODE) IMPLANT
PERCLOSE PROGLIDE 6F (VASCULAR PRODUCTS) ×6
POSITIONER HEAD DONUT 9IN (MISCELLANEOUS) ×3 IMPLANT
SET MICROPUNCTURE 5F STIFF (MISCELLANEOUS) ×3 IMPLANT
SHEATH BRITE TIP 7FR 35CM (SHEATH) ×3 IMPLANT
SHEATH PINNACLE 6F 10CM (SHEATH) ×3 IMPLANT
SHEATH PINNACLE 8F 10CM (SHEATH) ×3 IMPLANT
SLEEVE REPOSITIONING LENGTH 30 (MISCELLANEOUS) ×3 IMPLANT
STOPCOCK MORSE 400PSI 3WAY (MISCELLANEOUS) ×6 IMPLANT
SUT ETHIBOND X763 2 0 SH 1 (SUTURE) IMPLANT
SUT GORETEX CV 4 TH 22 36 (SUTURE) IMPLANT
SUT GORETEX CV4 TH-18 (SUTURE) IMPLANT
SUT MNCRL AB 3-0 PS2 18 (SUTURE) IMPLANT
SUT PROLENE 5 0 C 1 36 (SUTURE) IMPLANT
SUT PROLENE 6 0 C 1 30 (SUTURE) IMPLANT
SUT SILK  1 MH (SUTURE) ×2
SUT SILK 1 MH (SUTURE) ×1 IMPLANT
SUT VIC AB 2-0 CT1 27 (SUTURE)
SUT VIC AB 2-0 CT1 TAPERPNT 27 (SUTURE) IMPLANT
SUT VIC AB 2-0 CTX 36 (SUTURE) IMPLANT
SUT VIC AB 3-0 SH 8-18 (SUTURE) IMPLANT
SYR 50ML LL SCALE MARK (SYRINGE) ×3 IMPLANT
SYR BULB IRRIGATION 50ML (SYRINGE) IMPLANT
SYR MEDRAD MARK V 150ML (SYRINGE) ×3 IMPLANT
TOWEL GREEN STERILE (TOWEL DISPOSABLE) ×6 IMPLANT
TRANSDUCER W/STOPCOCK (MISCELLANEOUS) ×6 IMPLANT
TRAY FOLEY SLVR 14FR TEMP STAT (SET/KITS/TRAYS/PACK) IMPLANT
TUBE SUCT INTRACARD DLP 20F (MISCELLANEOUS) IMPLANT
VALVE 26 ULTRA SAPIEN KIT (Valve) ×3 IMPLANT
WIRE EMERALD 3MM-J .035X150CM (WIRE) ×3 IMPLANT
WIRE EMERALD 3MM-J .035X260CM (WIRE) ×3 IMPLANT

## 2019-04-15 NOTE — Transfer of Care (Signed)
Immediate Anesthesia Transfer of Care Note  Patient: Joshua Hudson  Procedure(s) Performed: TRANSCATHETER AORTIC VALVE REPLACEMENT, TRANSFEMORAL with POST BALLOON DILATION (N/A ) Intraoperative Transthoracic Echocardiogram (N/A )  Patient Location: Cath Lab  Anesthesia Type:MAC  Level of Consciousness: awake, alert  and oriented  Airway & Oxygen Therapy: Patient Spontanous Breathing and Patient connected to nasal cannula oxygen  Post-op Assessment: Report given to RN and Post -op Vital signs reviewed and stable  Post vital signs: Reviewed and stable  Last Vitals:  Vitals Value Taken Time  BP 89/45 04/15/19 0951  Temp    Pulse 49 04/15/19 0951  Resp 14 04/15/19 0951  SpO2 100 % 04/15/19 0951  Vitals shown include unvalidated device data.  Last Pain:  Vitals:   04/15/19 0554  TempSrc:   PainSc: 7       Patients Stated Pain Goal: 3 (07/62/26 3335)  Complications: No apparent anesthesia complications

## 2019-04-15 NOTE — Op Note (Signed)
[facility]ART AND VASCULAR CENTER   MULTIDISCIPLINARY HEART VALVE TEAM   TAVR OPERATIVE NOTE   Date of Procedure:  04/15/2019  Preoperative Diagnosis:   S/P Aortic Valve Replacement using Stented Bovine Pericardial Tissue Valve  Prosthetic Valve Dysfunction  Severe Aortic Insufficiency  Hemolytic Anemia  Postoperative Diagnosis: Same   Procedure:    Valve-in-Valve Transcatheter Aortic Valve Replacement - Percutaneous Right Transfemoral Approach  Edwards Sapien 3 Ultra THV (size 26 mm, model # 9750TFX, serial # [id number])   Co-Surgeons:  Valentina Gu. Roxy Manns, MD and Sherren Mocha, MD  Anesthesiologist:  Nolon Nations, MD  Echocardiographer:  Jenkins Rouge, MD  Pre-operative Echo Findings:  Severe prosthetic valve aortic insufficiency  Normal left ventricular systolic function  Post-operative Echo Findings:  No paravalvular leak  Unchanged left ventricular systolic function    BRIEF CLINICAL NOTE AND INDICATIONS FOR SURGERY  Patient is an 84 year old male status post aortic valve replacement using a bioprosthetic tissue valve in 2009 for severe aortic stenosis who has been referred for surgical consultation to discuss treatment options for management of prosthetic valve dysfunction with severe aortic insufficiency and acute diastolic congestive heart failure.  Patient underwent aortic valve replacement using a 25 mm Edwards magna stented bovine pericardial tissue valve by Dr. Servando Snare in 2009 for severe symptomatic aortic stenosis.  His early postoperative recovery was notable for postoperative atrial fibrillation which has not recurred.  He has done well from a cardiac standpoint and been followed carefully by Dr. Radford Pax.  Echocardiogram performed in January of this year revealed normal left ventricular systolic function and mild aortic insufficiency.  Transvalvular gradients across the bioprosthetic tissue valve were normal at that time.  Over the past 2 to 3 months the  patient has developed relatively acute onset of shortness of breath, chest discomfort, orthopnea, and lower extremity edema.  Initially symptoms were primarily with physical exertion but symptoms have progressed fairly rapidly such that now the patient gets short of breath with minimal activity and occasionally at rest.  He cannot lay flat in bed.  He has lower extremity edema.  He has had some tightness across his chest.  He has also had some dizzy spells with near syncope.  He was seen in follow-up by Dr. Radford Pax and transthoracic echocardiogram performed March 18, 2019 revealed normal left ventricular systolic function and at least moderate aortic insufficiency.  The patient subsequently underwent transesophageal echocardiogram and diagnostic cardiac catheterization on April 02, 2019.  TEE confirmed the presence of severe prosthetic valve dysfunction with severe aortic insufficiency.  There was no vegetation on the aortic valve and no other findings to suggest the presence of endocarditis.  Left ventricular systolic function was normal.  There is mild left ventricular chamber enlargement.  Diagnostic cardiac catheterization revealed normal coronary arteries with no significant coronary artery disease.  There was severe aortic insufficiency on room.  There is moderately elevated left ventricular end-diastolic pressure and pulmonary capillary wedge pressure consistent with acute diastolic congestive heart failure.  CT angiography was performed and the patient referred for surgical consultation.  During the course of the patient's preoperative work up they have been evaluated comprehensively by a multidisciplinary team of specialists coordinated through the [facility] in the [facility].  They have been demonstrated to suffer from symptomatic severe prosthetic valve dysfunction as noted above. The patient has been counseled extensively as to the relative risks and  benefits of all options for the treatment including long term medical therapy, conventional  surgery for redo aortic valve replacement, and valve-in-valve transcatheter aortic valve replacement.  All questions have been answered, and the patient provides full informed consent for the operation as described.   DETAILS OF THE OPERATIVE PROCEDURE  PREPARATION:    The patient is brought to the operating room on the above mentioned date and appropriate monitoring was established by the anesthesia team. The patient is placed in the supine position on the operating table.  Intravenous antibiotics are administered. The patient is monitored closely throughout the procedure under conscious sedation.  Baseline transthoracic echocardiogram was performed. The patient's chest, abdomen, both groins, and both lower extremities are prepared and draped in a sterile manner. A time out procedure is performed.   PERIPHERAL ACCESS:    Using the modified Seldinger technique, femoral venous access was obtained with placement of 6 Fr sheath on the right side.   A temporary transvenous pacemaker catheter was passed through the right femoral venous sheath under fluoroscopic guidance into the right ventricle.  The pacemaker was tested to ensure stable lead placement and pacemaker capture.    TRANSFEMORAL ACCESS:   Percutaneous transfemoral access and sheath placement was performed using ultrasound guidance.  The right common femoral artery was cannulated using a micropuncture needle and appropriate location was verified using hand injection angiogram.  A pair of Abbott Perclose percutaneous closure devices were placed and a 6 French sheath replaced into the femoral artery.  The patient was heparinized systemically and ACT verified > 250 seconds.    A 14 Fr transfemoral E-sheath was introduced into the right common femoral artery after progressively dilating over an Amplatz superstiff wire. An AL-2 catheter was used to  direct a straight-tip exchange length wire across the native aortic valve into the left ventricle. This was exchanged out for a pigtail catheter and position was confirmed in the LV apex. The pigtail catheter was exchanged for an Amplatz Extra-stiff wire in the LV apex.  Echocardiography was utilized to confirm appropriate wire position and no sign of entanglement in the mitral subvalvular apparatus.   TRANSCATHETER HEART VALVE DEPLOYMENT:   An Edwards Sapien 3 Ultra transcatheter heart valve (size 26 mm, model #9750TFX, serial #[id number]) was prepared and crimped per manufacturer's guidelines, and the proper orientation of the valve is confirmed on the [employer] delivery system. The valve was advanced through the introducer sheath using normal technique until in an appropriate position in the abdominal aorta beyond the sheath tip. The balloon was then retracted and using the fine-tuning wheel was centered on the valve. The valve was then advanced across the aortic arch using appropriate flexion of the catheter. The valve was carefully positioned across the aortic valve annulus. The Commander catheter was retracted using normal technique. Once final position of the valve has been confirmed, the valve is deployed while temporarily holding ventilation and during rapid ventricular pacing to maintain systolic blood pressure < 50 mmHg and pulse pressure < 10 mmHg. The balloon inflation is held for >3 seconds after reaching full deployment volume. Once the balloon has fully deflated the balloon is retracted into the ascending aorta and valve function is assessed using echocardiography. There is felt to be no paravalvular leak and no central aortic insufficiency.  The patient's hemodynamic recovery following valve deployment is good.  The deployment balloon is removed.    BALLOON AORTIC VALVULOPLASTY:   Balloon aortic valvuloplasty was performed using a 26 mm True Balloon high pressure valvuloplasty balloon  in an effort to perform balloon valvuloplasty fracture of  the patient's pre-existing valve stent ring.  Once optimal position was achieved, high pressure BAV was done under rapid ventricular pacing achieving a maximum balloon pressure of 20 atm.  At this point the valvuloplasty balloon ruptured prior to achieving fracture of the pre-existing valve's stent ring.  The patient recovered well hemodynamically and echocardiography confirmed normal function of the transcatheter heart valve with no aortic insufficiency and low transvalvular gradient.  The ruptured valvuloplasty balloon was pulled back over the guidewire but would not recapture into the femoral sheath.  Under the circumstances removal of the femoral sheath and balloon at the same time was required, so another attempt at balloon valvuloplasty fracture was aborted.    PROCEDURE COMPLETION:   The sheath was removed and femoral artery closure performed.  Protamine was administered once femoral arterial repair was complete. The temporary pacemaker and femoral sheath were removed with manual pressure used for hemostasis.    The patient tolerated the procedure well and is transported to the surgical intensive care in stable condition. There were no immediate intraoperative complications. All sponge instrument and needle counts are verified correct at completion of the operation.   The patient received 1 unit packed red blood cells during the procedure due to anemia which was present preoperatively.  The patient did not receive any intravenous contrast during the procedure.    Rexene Alberts, MD 04/15/2019 9:41 AM

## 2019-04-15 NOTE — Anesthesia Postprocedure Evaluation (Signed)
Anesthesia Post Note  Patient: Joshua Hudson  Procedure(s) Performed: TRANSCATHETER AORTIC VALVE REPLACEMENT, TRANSFEMORAL with POST BALLOON DILATION (N/A ) Intraoperative Transthoracic Echocardiogram (N/A )     Patient location during evaluation: SICU Anesthesia Type: MAC Level of consciousness: sedated and patient cooperative Pain management: pain level controlled Vital Signs Assessment: post-procedure vital signs reviewed and stable Respiratory status: spontaneous breathing Cardiovascular status: stable Anesthetic complications: no    Last Vitals:  Vitals:   04/15/19 1310 04/15/19 1530  BP: 137/67 136/66  Pulse: (!) 52 (!) 57  Resp:  15  Temp: 36.4 C (!) 36.3 C  SpO2: 100% 98%    Last Pain:  Vitals:   04/15/19 1530  TempSrc: Oral  PainSc:                  Nolon Nations

## 2019-04-15 NOTE — Interval H&P Note (Signed)
History and Physical Interval Note:  04/15/2019 5:54 AM  Joshua Hudson  has presented today for surgery, with the diagnosis of Severe Aortic Insufficiency.  The various methods of treatment have been discussed with the patient and family. After consideration of risks, benefits and other options for treatment, the patient has consented to  Procedure(s): TRANSCATHETER AORTIC VALVE REPLACEMENT, TRANSFEMORAL (N/A) TRANSESOPHAGEAL ECHOCARDIOGRAM (TEE) (N/A) as a surgical intervention.  The patient's history has been reviewed, patient examined, no change in status, stable for surgery.  I have reviewed the patient's chart and labs.  Questions were answered to the patient's satisfaction.     Rexene Alberts

## 2019-04-15 NOTE — Progress Notes (Signed)
Echocardiogram 2D Echocardiogram limited during TAVR has been performed.  Darlina Sicilian M 04/15/2019, 9:19 AM

## 2019-04-15 NOTE — Anesthesia Procedure Notes (Signed)
Arterial Line Insertion Start/End8/07/2019 6:50 AM Performed by: Renato Shin, CRNA, CRNA  Patient location: Pre-op. Preanesthetic checklist: patient identified, IV checked, site marked, risks and benefits discussed, surgical consent, monitors and equipment checked, pre-op evaluation, timeout performed and anesthesia consent Lidocaine 1% used for infiltration Right, radial was placed Catheter size: 20 G Hand hygiene performed  and maximum sterile barriers used   Attempts: 2 Procedure performed without using ultrasound guided technique. Following insertion, dressing applied and Biopatch. Post procedure assessment: normal and unchanged  Patient tolerated the procedure well with no immediate complications.

## 2019-04-15 NOTE — Anesthesia Procedure Notes (Signed)
Procedure Name: MAC Date/Time: 04/15/2019 7:37 AM Performed by: Candis Shine, CRNA Pre-anesthesia Checklist: Patient identified, Emergency Drugs available, Suction available, Patient being monitored and Timeout performed Patient Re-evaluated:Patient Re-evaluated prior to induction Oxygen Delivery Method: Simple face mask Dental Injury: Teeth and Oropharynx as per pre-operative assessment

## 2019-04-15 NOTE — Progress Notes (Signed)
    20 G R radial arterial line was removed by Eddie Dibbles RN, and pressure was for 10 min.  R radial pulse was palpable before and after the sheath pull.

## 2019-04-15 NOTE — Progress Notes (Addendum)
  [facility] VALVE TEAM  Patient doing well s/p TAVR. He is hemodynamically stable but still on Levophed. Groin sites stable. ECG with sinus brady but no high grade block. Plan to transfuse another unit of blood. H/H post transfusion. Light fluids running. Watch for volume overload with elevated BNP and significant LE pitting edema. When BP stable will plan to transfer up to 4E. Plan for early ambulation after bedrest completed and hopeful discharge over the next 24-48 hours.    Angelena Form PA-C  [facility]  Pager [phone]  3:14pm ADDENDUM: patient doing well. BP now in normal range. Getting second unit blood. He has 2+ pitting edema up to knees. Much improved per daughter and patient. Will give one dose of IV lasix 60mg  now.

## 2019-04-15 NOTE — Discharge Instructions (Signed)

## 2019-04-15 NOTE — Addendum Note (Signed)
Addendum  created 04/15/19 1645 by Nolon Nations, MD   SmartForm saved

## 2019-04-15 NOTE — Consult Note (Signed)
[facility]   MULTIDISCIPLINARY HEART VALVE TEAM  Date:  04/15/2019   ID:  Joshua Hudson, DOB September 27, 1934, MRN [id number]  PCP:  Joshua Orn, MD   CC: Shortness of Breath   HISTORY OF PRESENT ILLNESS: Joshua Hudson is a 84 y.o. male who presents for TAVR evaluation, referred by Dr Joshua Hudson.  He underwent bioprosthetic AVR in 2009 and has subsequently developed severe bioprosthetic aortic insufficiency with NYHA functional class IIIb symptoms. He complains of shortness of breath with minimal activity over the past 2-3 months. No chest pain, orthopnea, or PND. No edema. The patient has also been noted to have anemia with concern for hemolysis.   Past Medical History:  Diagnosis Date  . Anemia    Previous history of anemia  . Aortic insufficiency 03/24/2019   AI of bioprosthetic AVR  . Chronic diastolic CHF (congestive heart failure) ([place]) 03/24/2019  . Colon polyps    s/p diverticular perforation requiring 2-stage repair  . Diverticulosis   . DJD (degenerative joint disease), lumbosacral   . Esophageal stricture   . GERD (gastroesophageal reflux disease)   . History of blood transfusion    patient states "years ago"  . History of kidney stones    passed stones  . Hypertension   . Mitral regurgitation    moderate  . Occipital neuralgia   . Postoperative atrial fibrillation ([place]) 05/10/2015  . Prosthetic valve dysfunction   . Rheumatoid arthritis ([place])    s/o long term steroids  shoulders and hands  . Rotator cuff arthropathy    right  . S/P aortic valve replacement with bioprosthetic valve 01/16/2008   44mm Edwards Magna perimount bovine pericardial tissue valve, model 3000  . SBE (subacute bacterial endocarditis) prophylaxis candidate    for dental procedures  . Severe aortic stenosis    S/P prosthetic valve replacement w 25 mm Edwards like science percardial tissue valve,[place] - 01/2008    Current Facility-Administered Medications  Medication Dose Route  Frequency Provider Last Rate Last Dose  . [START ON 04/16/2019] 0.9 %  sodium chloride infusion   Intravenous Continuous Joshua Mocha, MD      . cefUROXime (ZINACEF) 1.5 g in sodium chloride 0.9 % 100 mL IVPB  1.5 g Intravenous To OR Joshua Alberts, MD      . chlorhexidine (HIBICLENS) 4 % liquid 2 application  30 mL Topical UD Joshua Mocha, MD      . chlorhexidine (HIBICLENS) 4 % liquid 4 application  60 mL Topical Once Joshua Mocha, MD       And  . Derrill Memo ON 04/16/2019] chlorhexidine (HIBICLENS) 4 % liquid 4 application  60 mL Topical Once Joshua Mocha, MD      . dexmedetomidine (PRECEDEX) 400 MCG/100ML (4 mcg/mL) infusion  0.1-0.7 mcg/kg/hr Intravenous To OR Joshua Alberts, MD      . heparin 30,000 units/NS 1000 mL solution for CELLSAVER   Other To OR Joshua Alberts, MD      . magnesium sulfate (IV Push/IM) injection 40 mEq  40 mEq Other To OR Joshua Alberts, MD      . norepinephrine (LEVOPHED) 4mg  in 232mL premix infusion  0-10 mcg/min Intravenous To OR Joshua Alberts, MD      . potassium chloride injection 80 mEq  80 mEq Other To OR Joshua Alberts, MD      . vancomycin (VANCOCIN) IVPB 1000 mg/200 mL premix  1,000 mg Intravenous To OR Joshua Alberts, MD  ALLERGIES:   Patient has no known allergies.   SOCIAL HISTORY:  The patient  reports that he quit smoking about 35 years ago. His smoking use included cigarettes. He started smoking about 45 years ago. He has a 5.00 pack-year smoking history. He has quit using smokeless tobacco.  His smokeless tobacco use included chew. He reports previous alcohol use. He reports that he does not use drugs.   FAMILY HISTORY:  The patient's family history includes Arthritis in his father; Heart Problems in his sister; Heart disease in his father; Other in his brother, daughter, mother, and sister; Suicidality in his brother.   REVIEW OF SYSTEMS:  Positive for fatigue, weakness.   All other systems are reviewed and negative.    PHYSICAL EXAM: VS:  BP (!) 128/36   Pulse 70   Temp 98.4 F (36.9 C) (Oral)   Resp 18   Ht 5\' 3"  (1.6 m)   Wt 47.6 kg   SpO2 96%   BMI 18.60 kg/m  , BMI Body mass index is 18.6 kg/m. GEN: Elderly male, in no acute distress HEENT: normal Neck: No JVD. carotids 2+ without bruits or masses Cardiac: The heart is RRR with 2/6 Systolic murmur at the RUSB and 4/6 diastolic decrescendo murmur at the LLSB No edema. Pedal pulses 2+ = bilaterally  Respiratory:  clear to auscultation bilaterally GI: soft, nontender, nondistended, + BS MS: no deformity or atrophy Skin: warm and dry, no rash Neuro:  Strength and sensation are intact Psych: euthymic mood, full affect  RECENT LABS: 03/18/2019: NT-Pro BNP 3,511 04/11/2019: ALT 30; B Natriuretic Peptide 834.7; BUN 17; Creatinine, Ser 1.17; Hemoglobin 7.0; Platelets 83; Potassium 4.0; Sodium 136  No results found for requested labs within last 8760 hours.   Estimated Creatinine Clearance: 32.8 mL/min (by C-G formula based on SCr of 1.17 mg/dL).   Wt Readings from Last 3 Encounters:  04/15/19 47.6 kg  04/11/19 (P) 49.4 kg  04/10/19 49.4 kg     CARDIAC STUDIES:  Cardiac Cath:  Conclusion    Angiographically minimal CAD  The left ventricular systolic function is normal -as assessed by aortic root angiogram  There is severe (4+) aortic regurgitation.  LV end diastolic pressure is moderately elevated.   SUMMARY  Angiographically normal coronary arteries with a right dominant system.  Severe aortic insufficiency seen on aortic root angiogram  Mild to moderate elevated LVEDP and PCWP   Diagnostic Dominance: Co-dominant Left Anterior Descending  First Diagonal Branch  Vessel is small in size.  First Septal Branch  Vessel is small in size.  Second Diagonal Branch  Vessel is moderate in size  Right Coronary Artery  Acute Marginal Branch  Vessel is small in size.  Right Posterior Descending Artery  Vessel is actually moderate  in size The vessel is tortuous.  Second Right Posterolateral Branch  Moderate size vessel  Intervention  No interventions have been documented. Right Heart  Right Heart Pressures PA P-mean: 39/13 mmHg - 24 mmHg (borderline. PCWP 22/34 mmHg - 21 mmHg with very large V wave Elevated LV EDP consistent with volume overload. LV P-EDP 112/7 mmHg - 19 mmHg AOP-MAP 112/40 mmHg - 66 mmHg Ao sat 96%, PA sat 55% CARDIAC OUTPUT-INDEX ([facility]): 4.96-3.36  Right Atrium The right atrial size is at the upper limits of normal. Right atrial pressure is elevated. RAP 10 mmHg  Right Ventricle The right ventricle is mildly dilated. RVP-EDP 45/2 mmHg - 12 mmHg     TEE: IMPRESSIONS  1. The left ventricle has normal systolic function, with an ejection fraction of 60-65%. The cavity size was mildly dilated. Left ventricular diastolic function could not be evaluated.  2. The right ventricle has normal systolc function. The cavity was normal. There is no increase in right ventricular wall thickness.  3. No evidence of a thrombus present in the left atrial appendage.  4. Aortic valve regurgitation is severe by color flow Doppler. Mild stenosis of the aortic valve.  5. No vegetation on the aortic valve.  6. 25 mm Edwards stented bioprosthetic valve. Leaflets thickened and calcified Sever AR tha appears central and not peri valvular. Suggest aortic root injection at cath and TAVR CTA to further assess possible valve in valve Rx.  7. Pulmonic valve regurgitation is mild by color flow Doppler.  8. The aortic root is normal in size and structure.  9. Extensive use of 3D imaging done to assess AVR.  CTA Heart: FINDINGS: Aortic Valve: Patient has a 25 mm Edwards Magna stented pericardial tissue valve in place. Sewing ring is intact. The ID matches at 24 mm. The leaflets are thickened and calcified. Possible perforation at the base of the non coronary cusp. Strut distance is 22 mm  Aorta: No aneurysm normal  arch vessels Somewhat tortuous left subclavian  Ascending Thoracic Aorta: 3.4 cm  Aortic Arch: 2.6 cm  Descending Thoracic Aorta: 2.6 cm  Coronary Arteries: Seem low risk for obstruction. Basal SA LM is 8.6 mm from leaflet with height of 10.75 mm. RCA basal SA is 8.5 mm from leaflets with height of 11.4 mm  IMPRESSION: 1. Patient is post AVR with 25 mm Edwards 3000 Magna stented pericardial tissue valve. Sewing ring intact Possible perforation at base of non coronary cusp  2. See measurements above coronary arteries seem low risk of occlusion for valve in valve TAVR  3. Normal aortic root 3.4 cm with normal arch vessels and tortuous left subclavian  4. Suitable for a 26 mm Sapien 3 valve in valve procedure for severe central not peri valvular AR  CTA Chest/Abd/Pelvis: VASCULAR MEASUREMENTS PERTINENT TO TAVR:  AORTA:  Minimal Aortic Diameter-15 x 13 mm mm  Severity of Aortic Calcification-moderate  RIGHT PELVIS:  Right Common Iliac Artery -  Minimal Diameter-9.4 x 8.2 mm  Tortuosity-moderate  Calcification-moderate  Right External Iliac Artery -  Minimal Diameter-6.4 x 5.8 mm  Tortuosity-moderate  Calcification-none  Right Common Femoral Artery -  Minimal Diameter-7.9 x 7.6 mm  Tortuosity-mild  Calcification-mild  LEFT PELVIS:  Left Common Iliac Artery -  Minimal Diameter-8.4 x 8.1 mm  Tortuosity-severe  Calcification-moderate  Left External Iliac Artery -  Minimal Diameter-5.8 x 5.7 mm  Tortuosity-severe  Calcification-none  Left Common Femoral Artery -  Minimal Diameter-8.3 x 7.5 mm  Tortuosity-mild  Calcification-mild  Review of the MIP images confirms the above findings.  IMPRESSION: 1. Vascular findings and measurements pertinent to potential TAVR procedure, as detailed above. 2. Status post median sternotomy for aortic valve replacement with bioprosthetic aortic valve, which  demonstrates some mild cos calcification. 3. Aortic atherosclerosis, in addition to left main and 2 vessel coronary artery disease. 4. Stable number and size of small pulmonary nodules in the lungs bilaterally, as above. These are favored to be benign, however, attention on repeat noncontrast chest CT in 1 year is recommended to ensure stability. 5. Additional incidental findings, as above.   STS RISK CALCULATOR: Procedure: Isolated redo AVR   Risk of Mortality: 10.444%  Renal Failure: 5.449%  Permanent Stroke:  3.218%  Prolonged Ventilation: 25.063%  DSW Infection: 0.145%  Reoperation: 12.999%  Morbidity or Mortality: 37.026%  Short Length of Stay: 10.666%  Long Length of Stay: 24.054%   ASSESSMENT AND PLAN: 84 yo male with severe, symptomatic, bioprosthetic aortic valve insufficiency and associated NYHA IIIb acute on chronic diastolic heart failure. Preoperative studies are reviewed and demonstrate preserved LV systolic function with LVEF 60%, patent coronary arteries with no significant obstructive disease, and adequate iliofemoral vessels for transfemoral access. After multidisciplinary heart team review of his case, we plan to treat him with a 26 mm Sapien valve via right transfemoral access.  Following the decision to proceed with transcatheter aortic valve replacement, a discussion has been held regarding what types of management strategies would be attempted intraoperatively in the event of life-threatening complications, including whether or not the patient would be considered a candidate for the use of cardiopulmonary bypass and/or conversion to open sternotomy for attempted surgical intervention.  The patient has been advised of a variety of complications that might develop including but not limited to risks of death, stroke, paravalvular leak, aortic dissection or other major vascular complications, aortic annulus rupture, device embolization, cardiac rupture or perforation,  mitral regurgitation, acute myocardial infarction, arrhythmia, heart block or bradycardia requiring permanent pacemaker placement, congestive heart failure, respiratory failure, renal failure, pneumonia, infection, other late complications related to structural valve deterioration or migration, or other complications that might ultimately cause a temporary or permanent loss of functional independence or other long term morbidity.  The patient provides full informed consent for the procedure as described and all questions were answered.  Deatra James 04/15/2019 6:20 AM     [facility] [address] [place] [address]  [phone] (office) [phone] (fax)

## 2019-04-15 NOTE — Op Note (Signed)
[facility]   MULTIDISCIPLINARY HEART VALVE TEAM   TAVR OPERATIVE NOTE   Date of Procedure:  04/15/2019  Preoperative Diagnosis: Severe Aortic Stenosis   Postoperative Diagnosis: Same   Procedure:    Transcatheter Aortic Valve Replacement - Percutaneous Transfemoral Approach  Edwards Sapien 3 Ultra THV (size 26 mm, model # 9750TFX, serial #[id number]  Surgeons:  Valentina Gu. Roxy Manns, MD and Sherren Mocha, MD  Anesthesiologist: Nolon Nations, MD  Echocardiographer:  Jenkins Rouge, MD  Pre-operative Echo Findings:  Severe bioprosthetic aortic insufficiency  Normal left ventricular systolic function  Post-operative Echo Findings:  No paravalvular leak  Normal/unchanged left ventricular systolic function  BRIEF CLINICAL NOTE AND INDICATIONS FOR SURGERY  This is an 84 year old male status post bioprosthetic aortic valve replacement in 2009 who is developed severe symptomatic bioprosthetic aortic insufficiency with New York Heart Association functional class IIIb symptoms.  He complains of progressive shortness of breath and has been noted to have acute on chronic diastolic heart failure.  He has had no chest pain, orthopnea, or PND.  He is also developed hemolytic anemia with a hemoglobin in the range of 7 to 8 mg/dL.  He presents today after undergoing extensive preoperative work-up for transfemoral valve in valve TAVR.  During the course of the patient's preoperative work up they have been evaluated comprehensively by a multidisciplinary team of specialists coordinated through the [facility] in the [facility].  They have been demonstrated to suffer from symptomatic severe aortic stenosis as noted above. The patient has been counseled extensively as to the relative risks and benefits of all options for the treatment of severe aortic stenosis including long term medical therapy, conventional surgery for aortic valve  replacement, and transcatheter aortic valve replacement.  The patient has been independently evaluated in formal cardiac surgical consultation by Dr Myriam Jacobson, who deemed the patient appropriate for TAVR. Based upon review of all of the patient's preoperative diagnostic tests they are felt to be candidate for transcatheter aortic valve replacement using the transfemoral approach as an alternative to conventional surgery.    Following the decision to proceed with transcatheter aortic valve replacement, a discussion has been held regarding what types of management strategies would be attempted intraoperatively in the event of life-threatening complications, including whether or not the patient would be considered a candidate for the use of cardiopulmonary bypass and/or conversion to open sternotomy for attempted surgical intervention.  The patient has been advised of a variety of complications that might develop peculiar to this approach including but not limited to risks of death, stroke, paravalvular leak, aortic dissection or other major vascular complications, aortic annulus rupture, device embolization, cardiac rupture or perforation, acute myocardial infarction, arrhythmia, heart block or bradycardia requiring permanent pacemaker placement, congestive heart failure, respiratory failure, renal failure, pneumonia, infection, other late complications related to structural valve deterioration or migration, or other complications that might ultimately cause a temporary or permanent loss of functional independence or other long term morbidity.  The patient provides full informed consent for the procedure as described and all questions were answered preoperatively.  DETAILS OF THE OPERATIVE PROCEDURE  PREPARATION:   The patient is brought to the operating room on the above mentioned date and central monitoring was established by the anesthesia team including placement of a central venous catheter and radial arterial  line. The patient is placed in the supine position on the operating table.  Intravenous antibiotics are administered. The patient is monitored closely throughout  the procedure under conscious sedation.  Baseline transthoracic echocardiogram is performed. The patient's chest, abdomen, both groins, and both lower extremities are prepared and draped in a sterile manner. A time out procedure is performed.   PERIPHERAL ACCESS:   Using ultrasound guidance, an 8 French sheath is placed in the right femoral vein.    A temporary transvenous pacemaker catheter was passed through the femoral venous sheath under fluoroscopic guidance into the right ventricle.  The pacemaker was tested to ensure stable lead placement and pacemaker capture.   TRANSFEMORAL ACCESS:  A micropuncture technique is used to access the right femoral artery under fluoroscopic and ultrasound guidance.  2 Perclose devices are deployed at 10' and 2' positions to 'PreClose' the femoral artery. An 8 French sheath is placed and then an Amplatz Superstiff wire is advanced through the sheath. This is changed out for a 14 French transfemoral E-Sheath after progressively dilating over the Superstiff wire.  An AL-2 catheter was used to direct a straight-tip exchange length wire across the native aortic valve into the left ventricle. This was exchanged out for a pigtail catheter and position was confirmed in the LV apex.  The pigtail catheter was exchanged for an Amplatz Extra-stiff wire in the LV apex.    BALLOON AORTIC VALVULOPLASTY:  Not performed  TRANSCATHETER HEART VALVE DEPLOYMENT:  An Edwards Sapien 3 ultra transcatheter heart valve (size 26 mm) was prepared and crimped per manufacturer's guidelines, and the proper orientation of the valve is confirmed on the [employer] delivery system. The valve was advanced through the introducer sheath using normal technique until in an appropriate position in the abdominal aorta beyond the sheath  tip. The balloon was then retracted and using the fine-tuning wheel was centered on the valve. The valve was then advanced across the aortic arch using appropriate flexion of the catheter. The valve was carefully positioned across the aortic valve annulus and the biological stent valve frame is used for anatomic landmarks.. The Commander catheter was retracted using normal technique. Once final position of the valve has been confirmed by angiographic assessment, the valve is deployed while temporarily holding ventilation and during rapid ventricular pacing to maintain systolic blood pressure < 50 mmHg and pulse pressure < 10 mmHg. The balloon inflation is held for >3 seconds after reaching full deployment volume. Once the balloon has fully deflated the balloon is retracted into the ascending aorta and valve function is assessed using echocardiography. The patient's hemodynamic recovery following valve deployment is good.  Attention was then turned to postdilatation with a goal of fracturing the sewing ring of the indwelling biological valve.  A 26 mm true balloon is utilized.  The balloon is prepped using normal technique and advanced into the body across the 0.035 inch extra-stiff wire.  The balloon is advanced across the TAVR prosthesis and inflated using a 2 syringe technique with a 60 cc syringe and the atria on device.  The balloon is inflated to approximately 22 atm at which time the balloon burst.  This was all performed under rapid ventricular pacing.  The patient tolerated the balloon inflation without significant hemodynamic sequelae.   Echo demostrated acceptable post-procedural gradients, stable mitral valve function, and no aortic insufficiency.    PROCEDURE COMPLETION:  The sheath was removed and femoral artery closure is performed using the 2 previously deployed Perclose devices.  The true balloon would not pull into the E sheath, so these are brought out of the body together over a 0.035 inch  wire.  The Perclose devices are tightened without residual hematoma or bleeding.  The wire was removed.  Protamine is administered once femoral arterial repair was complete. The site is clear with no evidence of bleeding or hematoma after the sutures are tightened. The temporary pacemaker.  The patient tolerated the procedure well and is transported to the surgical intensive care in stable condition. There were no immediate intraoperative complications. All sponge instrument and needle counts are verified correct at completion of the operation.   The patient received a total of 0 mL of intravenous contrast during the procedure.   Sherren Mocha, MD 04/15/2019 9:55 AM

## 2019-04-15 NOTE — Progress Notes (Signed)
Pharmacy note: post-op vancomycin  84 yo male s/p TAVR ordered vancomycin for post-op prophylaxis. Vancomycin 1000mg  IV given today at about 7:30am (pre-op) -Wt= 47.6kg -SCr= 1.1, CrCl ~ 30  Plan -Vancomycin 1000mg  IV given earlier will cover for 24 hours -No further vancomycin is needed (post-op vancomycin order discontinued).  Thank you, Hildred Laser, PharmD Clinical Pharmacist **Pharmacist phone directory can now be found on [email or web address] ([email or web address]).  Listed under [place].

## 2019-04-16 ENCOUNTER — Inpatient Hospital Stay (HOSPITAL_COMMUNITY): Payer: Medicare Other

## 2019-04-16 ENCOUNTER — Encounter (HOSPITAL_COMMUNITY): Payer: Self-pay | Admitting: Cardiovascular Disease

## 2019-04-16 DIAGNOSIS — I340 Nonrheumatic mitral (valve) insufficiency: Secondary | ICD-10-CM

## 2019-04-16 DIAGNOSIS — I361 Nonrheumatic tricuspid (valve) insufficiency: Secondary | ICD-10-CM

## 2019-04-16 LAB — CBC
HCT: 31.2 % — ABNORMAL LOW (ref 39.0–52.0)
Hemoglobin: 10.1 g/dL — ABNORMAL LOW (ref 13.0–17.0)
MCH: 24.8 pg — ABNORMAL LOW (ref 26.0–34.0)
MCHC: 32.4 g/dL (ref 30.0–36.0)
MCV: 76.7 fL — ABNORMAL LOW (ref 80.0–100.0)
Platelets: 46 10*3/uL — ABNORMAL LOW (ref 150–400)
RBC: 4.07 MIL/uL — ABNORMAL LOW (ref 4.22–5.81)
RDW: 22.8 % — ABNORMAL HIGH (ref 11.5–15.5)
WBC: 5.7 10*3/uL (ref 4.0–10.5)
nRBC: 0 % (ref 0.0–0.2)

## 2019-04-16 LAB — TYPE AND SCREEN
ABO/RH(D): A POS
Antibody Screen: NEGATIVE
Unit division: 0
Unit division: 0

## 2019-04-16 LAB — BPAM RBC
Blood Product Expiration Date: 202009072359
Blood Product Expiration Date: 202009072359
ISSUE DATE / TIME: 202008110744
ISSUE DATE / TIME: 202008111036
Unit Type and Rh: 6200
Unit Type and Rh: 6200

## 2019-04-16 LAB — BASIC METABOLIC PANEL
Anion gap: 9 (ref 5–15)
BUN: 16 mg/dL (ref 8–23)
CO2: 27 mmol/L (ref 22–32)
Calcium: 8.6 mg/dL — ABNORMAL LOW (ref 8.9–10.3)
Chloride: 103 mmol/L (ref 98–111)
Creatinine, Ser: 1.08 mg/dL (ref 0.61–1.24)
GFR calc Af Amer: >60 mL/min (ref >60)
GFR calc non Af Amer: >60 mL/min (ref >60)
Glucose, Bld: 80 mg/dL (ref 70–99)
Potassium: 4.3 mmol/L (ref 3.5–5.1)
Sodium: 139 mmol/L (ref 135–145)

## 2019-04-16 LAB — ECHOCARDIOGRAM COMPLETE
Height: 63 in
Weight: 1668.8 oz

## 2019-04-16 LAB — MAGNESIUM: Magnesium: 1.9 mg/dL (ref 1.7–2.4)

## 2019-04-16 MED ORDER — POTASSIUM CHLORIDE CRYS ER 20 MEQ PO TBCR: EXTENDED_RELEASE_TABLET | ORAL | 3 refills | Status: DC

## 2019-04-16 MED ORDER — CLOPIDOGREL BISULFATE 75 MG PO TABS: 75.0000 mg | ORAL_TABLET | Freq: Every day | ORAL | 1 refills | Status: DC

## 2019-04-16 MED ORDER — FUROSEMIDE 40 MG PO TABS: ORAL_TABLET | ORAL | 3 refills | Status: DC

## 2019-04-16 NOTE — Progress Notes (Signed)
  Echocardiogram 2D Echocardiogram has been performed.  Jennette Dubin 04/16/2019, 9:36 AM

## 2019-04-16 NOTE — Progress Notes (Signed)
CARDIAC REHAB PHASE I   PRE:  Rate/Rhythm: 73 SR  BP:  Sitting: 109/61      SaO2: 97 RA  MODE:  Ambulation: 470 ft   POST:  Rate/Rhythm: 95 SR with PVCs  BP:  Sitting: 116/66    SaO2: 98 RA   Pt ambulated 430ft in hallway standby assist with steady gait. Pt denies CP or SOB. Pt states breathing is "much better" than preop. Pt educated on site care, and restrictions. Encouraged ambulation as able. Pt declining CRP II at this time.   [id number] Rufina Falco, RN BSN 04/16/2019 9:11 AM

## 2019-04-16 NOTE — Progress Notes (Signed)
Pt educated and provided discharge instructions. Pt iv removed and intact. Pt vitals stable. Pt denies any complaints. telebox removed/ccmd notified.  Pt has all belongings. Volunteers called to tx pt via wheelchair to valet to meet ride.

## 2019-04-16 NOTE — Discharge Summary (Addendum)
[facility]VE TEAM  Discharge Summary    Patient ID: Joshua Hudson MRN: [id number]; DOB: 21-Sep-1934  Admit date: 04/15/2019 Discharge date: 04/16/2019  Primary Care Provider: Lavone Orn, MD  Primary Cardiologist: [name], MD / Dr. Burt Knack & Dr. Roxy Manns (TAVR)  Discharge Diagnoses    Principal Problem:   S/P valve-in-valve TAVR Active Problems:   S/P aortic valve replacement with bioprosthetic valve   Hemolytic anemia (HCC)   Acute on chronic diastolic heart failure (HCC)   Postoperative atrial fibrillation (HCC)   Mitral regurgitation   Severe aortic regurgitation   Prosthetic valve dysfunction   Hypertension   Rheumatoid arthritis ([place])   Allergies No Known Allergies  Diagnostic Studies/Procedures     TAVR OPERATIVE NOTE   Date of Procedure:                04/15/2019  Preoperative Diagnosis:        S/P Aortic Valve Replacement using Stented Bovine Pericardial Tissue Valve  Prosthetic Valve Dysfunction  Severe Aortic Insufficiency  Hemolytic Anemia  Postoperative Diagnosis:    Same   Procedure:        Valve-in-Valve Transcatheter Aortic Valve Replacement - Percutaneous Right Transfemoral Approach             Edwards Sapien 3 Ultra THV (size 26 mm, model # 9750TFX, serial # [id number])              Co-Surgeons:                        Valentina Gu. Roxy Manns, MD and Sherren Mocha, MD  Anesthesiologist:                  Nolon Nations, MD  Echocardiographer:              Jenkins Rouge, MD  Pre-operative Echo Findings: ? Severe prosthetic valve aortic insufficiency ? Normal left ventricular systolic function  Post-operative Echo Findings: ? No paravalvular leak ? Unchanged left ventricular systolic function   _____________    Echo 04/16/19: pending formal read at the time of discharge    History of Present Illness     Joshua Hudson is a 84 y.o. male with a history of severe AS s/p AVR with  a 26 mm Edwards bioprosthetic valve in 2009 with post op afib (with no recurrence), HTN, rheumatoid arthritis on MTX, leflunomide and prednisone, mod MR, and bioprosthetic valve failure with severe AI and associated hemolytic anemia and acute diastolic CHF who presented to [facility] on 04/15/19 for planned valve in valve TAVR.  Patient underwent aortic valve replacement using a 25 mm Edwards magna stented bovine pericardial tissue valve by Dr. Servando Snare in 2009 for severe symptomatic aortic stenosis.  His early postoperative recovery was notable for postoperative atrial fibrillation which has not recurred.  He has done well from a cardiac standpoint and been followed carefully by Dr. Radford Pax.  Echocardiogram performed in January of this year revealed normal left ventricular systolic function and mild aortic insufficiency. Over the past 2 to 3 months the patient has developed relatively acute onset of shortness of breath, chest discomfort, orthopnea, and lower extremity edema.  Initially symptoms were primarily with physical exertion but symptoms have progressed fairly rapidly such that now the patient gets short of breath with minimal activity and occasionally at rest.   He has had problems with his right shoulder and underwent right shoulder replacement more than 1  year ago.  He has had multiple problems ever since with an attempt at revision of his shoulder arthroplasty which subsequently became infected, requiring removal of the hardware and placement of temporary plastic hardware.  He has been evaluated for redo shoulder replacement in the future. He was seen by Estella Husk- PA in the office on 03/18/2019 for preoperative clearance for redo shoulder arthroplasty.  He was noted to have significant lower extremity edema as well as other signs and symptoms of acute heart failure.  Follow-up TTE on 03/18/2019 revealed normal left ventricular systolic function with at least moderate AI.  Subsequent TEE on 04/02/2019  confirmed the presence of severe prosthetic valve dysfunction with severe AI.  There was no vegetation on the aortic valve she suggest endocarditis. Cardiac catheterization on 04/02/2019 showed normal coronaries.  The patient has been evaluated by the multidisciplinary valve team and felt to have severe, symptomatic aortic insufficiency 2/2 prosthetic valve dysfunction and to be a suitable candidate for TAVR, which was set up for 04/15/19.    Hospital Course     Consultants: none  Prosthetic valve dysfunction with severe AI: s/p successful valve in valve TAVR with a 26 mm Edwards Sapien Ultra THV via the TF approach on 04/15/19. Post operative echo completed but pending formal read. Groin sites are stable. ECG with sinus and no high grade heart block. Continue Asprin and Plavix. I will see him back next week for close outpatient follow up  Acute on chronic diastolic CHF: as evidenced by an elevated BNP >800 on pre admission blood work and volume excess on physical exam.  He has 2+ bilateral pitting edema that is much improved from previous per patient and daughter.  He was treated with 1 dose of IV Lasix 60 mg in the hospital.  Will resume home Lasix 40 mg daily. BMET in the office next week.  Hemolytic anemia: as evidenced by decreased haptoglobin (<10) on preadmission lab work.  He has been treated with 2 units of PRBCs and TAVR. Hemoglobin up to 10.  Will recheck CBC next week in the office.  Thrombocytopenia: platelets have been steadily declining over the past 4 weeks.  On 03/24/2019 his platelets were normal at 172,000.  Now at 46,000.  Will repeat CBC next week.  Pulmonary nodules: stable number and size of small pulmonary nodules in the lungs bilaterally. These are favored to be benign, however, attention on repeat noncontrast chest CT in 1 year is recommended to ensure stability ( this is being actively followed by Dr. Laurann Montana with recent PET scan that was reassuring but showed hypermetabolic  activity in the anus.) Will defer to Dr. Laurann Montana.   NSVT: 12 beats of NSVT note on tele. Resume BB  _____________  Discharge Vitals Blood pressure 113/71, pulse 63, temperature 98.7 F (37.1 C), temperature source Oral, resp. rate 18, height 5\' 3"  (1.6 m), weight 47.3 kg, SpO2 98 %.  Filed Weights   04/15/19 0539 04/16/19 0408  Weight: 47.6 kg 47.3 kg   GEN: chronically ill appearing HEENT: normal Neck: no JVD or masses Cardiac: RRR; no murmurs, rubs, or gallops. 2 + bilateral LE edema Respiratory:  clear to auscultation bilaterally, normal work of breathing GI: soft, nontender, nondistended, + BS MS: no deformity or atrophy Skin: warm and dry, no rash . Groin sites clear without hematoma or ecchymosis  Neuro:  Alert and Oriented x 3, Strength and sensation are intact Psych: euthymic mood, full affect    Labs & Radiologic Studies  CBC Recent Labs    04/15/19 0951 04/16/19 0344  WBC  --  5.7  HGB 7.5* 10.1*  HCT 22.0* 31.2*  MCV  --  76.7*  PLT  --  46*   Basic Metabolic Panel Recent Labs    04/15/19 0951 04/16/19 0344  NA 140 139  K 4.0 4.3  CL 108 103  CO2  --  27  GLUCOSE 110* 80  BUN 16 16  CREATININE 0.90 1.08  CALCIUM  --  8.6*  MG  --  1.9   Liver Function Tests No results for input(s): AST, ALT, ALKPHOS, BILITOT, PROT, ALBUMIN in the last 72 hours. No results for input(s): LIPASE, AMYLASE in the last 72 hours. Cardiac Enzymes No results for input(s): CKTOTAL, CKMB, CKMBINDEX, TROPONINI in the last 72 hours. BNP Invalid input(s): POCBNP D-Dimer No results for input(s): DDIMER in the last 72 hours. Hemoglobin A1C No results for input(s): HGBA1C in the last 72 hours. Fasting Lipid Panel No results for input(s): CHOL, HDL, LDLCALC, TRIG, CHOLHDL, LDLDIRECT in the last 72 hours. Thyroid Function Tests No results for input(s): TSH, T4TOTAL, T3FREE, THYROIDAB in the last 72 hours.  Invalid input(s): FREET3 _____________  Dg Chest 2  View  Result Date: 04/11/2019 CLINICAL DATA:  Preoperative exam. EXAM: CHEST - 2 VIEW COMPARISON:  None. FINDINGS: The heart size is borderline. The hila and mediastinum are normal. No pneumothorax. No nodules or masses. No focal infiltrates. No other acute abnormalities are identified. Previous right shoulder surgery is identified. IMPRESSION: No acute abnormalities. Electronically Signed   By: Dorise Bullion III M.D   On: 04/11/2019 20:43   Nm Pet Image Initial (pi) Skull Base To Thigh  Result Date: 03/28/2019 CLINICAL DATA:  Initial treatment strategy for lung nodule. EXAM: NUCLEAR MEDICINE PET SKULL BASE TO THIGH TECHNIQUE: 5.3 mCi F-18 FDG was injected intravenously. Full-ring PET imaging was performed from the skull base to thigh after the radiotracer. CT data was obtained and used for attenuation correction and anatomic localization. Fasting blood glucose: 88 mg/dl COMPARISON:  CT chest 03/10/2019 FINDINGS: Mediastinal blood pool activity: SUV max 2.3 Liver activity: SUV max NA NECK: No hypermetabolic lymph nodes in the neck. Incidental CT findings: none CHEST: The bilateral upper lobe pulmonary nodules in question on recent chest CT are identified on today's PET exam. 10 mm right upper lobe nodule 14/8) demonstrates SUV max = 0.62. 10 mm left upper lobe nodule (21/8) demonstrates SUV max = 0.65. No hypermetabolic lymphadenopathy in the chest. Incidental CT findings: Atelectasis noted posterior left base. Heart is enlarged. Coronary artery calcification is evident. Atherosclerotic calcification is noted in the wall of the thoracic aorta. Scattered areas of architectural distortion in the upper lungs bilaterally was better demonstrated on previous diagnostic chest CT. ABDOMEN/PELVIS: No abnormal hypermetabolic activity within the liver, pancreas, adrenal glands, or spleen. No hypermetabolic lymph nodes in the abdomen or pelvis. Focal hypermetabolic FDG accumulation is identified in the anus. Incidental  CT findings: Probable tiny calcified gallstone on image 113/series 4. There is abdominal aortic atherosclerosis without aneurysm. Diffuse diverticular disease noted in the colon. Trace free fluid evident in the pelvis. 2.8 cm exophytic cyst noted interpolar right kidney. SKELETON: No focal hypermetabolic activity to suggest skeletal metastasis. Incidental CT findings: Bones are diffusely demineralized. Status post lumbar fusion. IMPRESSION: 1. No hypermetabolism in the 10 mm right upper lobe and left upper lobe nodules. This is reassuring and given the other areas of subtle nodularity/architectural distortion, features likely reflect post infectious/postinflammatory scarring.  While the lack of FDG accumulation is reassuring, low-grade or well differentiated neoplasm can be poorly FDG avid and continued surveillance likely warranted. 2. Focal hypermetabolism in the anus. This can be physiologic but correlation suggested to exclude anal neoplasm. 3.  Aortic Atherosclerois (ICD10-170.0) 4. Colonic diverticulosis without diverticulitis. 5. Trace free fluid noted in the pelvis, indeterminate. Electronically Signed   By: Misty Stanley M.D.   On: 03/28/2019 11:47   Ct Coronary Morph W/cta Cor W/score W/ca W/cm &/or Wo/cm  Addendum Date: 04/07/2019   ADDENDUM REPORT: 04/07/2019 13:02 CLINICAL DATA:  Aortic Regurgitation Possible Valve in Valve TAVR EXAM: Cardiac TAVR CT TECHNIQUE: The patient was scanned on a Siemens Force 633 slice scanner. A 120 kV retrospective scan was triggered in the descending thoracic aorta at 111 HU's. Gantry rotation speed was 270 msecs and collimation was .9 mm. No beta blockade or nitro were given. The 3D data set was reconstructed in 5% intervals of the R-R cycle. Systolic and diastolic phases were analyzed on a dedicated work station using MPR, MIP and VRT modes. The patient received 80 cc of contrast. FINDINGS: Aortic Valve: Patient has a 25 mm Edwards Magna stented pericardial tissue  valve in place. Sewing ring is intact. The ID matches at 24 mm. The leaflets are thickened and calcified. Possible perforation at the base of the non coronary cusp. Strut distance is 22 mm Aorta: No aneurysm normal arch vessels Somewhat tortuous left subclavian Ascending Thoracic Aorta: 3.4 cm Aortic Arch: 2.6 cm Descending Thoracic Aorta: 2.6 cm Coronary Arteries: Seem low risk for obstruction. Basal SA LM is 8.6 mm from leaflet with height of 10.75 mm. RCA basal SA is 8.5 mm from leaflets with height of 11.4 mm IMPRESSION: 1. Patient is post AVR with 25 mm Edwards 3000 Magna stented pericardial tissue valve. Sewing ring intact Possible perforation at base of non coronary cusp 2. See measurements above coronary arteries seem low risk of occlusion for valve in valve TAVR 3. Normal aortic root 3.4 cm with normal arch vessels and tortuous left subclavian 4. Suitable for a 26 mm Sapien 3 valve in valve procedure for severe central not peri valvular AR Jenkins Rouge Electronically Signed   By: Jenkins Rouge M.D.   On: 04/07/2019 13:02   Result Date: 04/07/2019 EXAM: OVER-READ INTERPRETATION  CT CHEST The following report is an over-read performed by radiologist Dr. Vinnie Langton of [facility], [place] on 04/07/2019. This over-read does not include interpretation of cardiac or coronary anatomy or pathology. The coronary calcium score/coronary CTA interpretation by the cardiologist is attached. COMPARISON:  Cardiac CTA 02/16/2014.  Chest CT 03/10/2019. FINDINGS: Extracardiac findings will be described separately under dictation for contemporaneously obtained CTA chest, abdomen and pelvis. IMPRESSION: Please see separate dictation for contemporaneously obtained CTA chest, abdomen and pelvis 04/07/2019 for full description of relevant extracardiac findings. Electronically Signed: By: Vinnie Langton M.D. On: 04/07/2019 12:40   Dg Chest Port 1 View  Result Date: 04/15/2019 CLINICAL DATA:  Status post TAVR EXAM:  PORTABLE CHEST 1 VIEW COMPARISON:  04/11/2019 FINDINGS: There are changes consistent with the recent history of TAVR superimposed over a previous aortic valve. Cardiac shadow is stable. The lungs are well aerated bilaterally. No pneumothorax is seen. No sizable effusion is noted. No acute bony abnormality is seen. Postoperative change in the proximal right humerus and lumbar spine are seen. IMPRESSION: Status post TAVR.  No acute abnormality noted. Electronically Signed   By: Inez Catalina M.D.   On: 04/15/2019  13:50   Ct Angio Chest Aorta W &/or Wo Contrast  Result Date: 04/07/2019 CLINICAL DATA:  84 year old male with history of severe bioprosthetic aortic valve insufficiency. Preprocedural study prior to potential transcatheter aortic valve replacement (TAVR) procedure. EXAM: CT ANGIOGRAPHY CHEST, ABDOMEN AND PELVIS TECHNIQUE: Multidetector CT imaging through the chest, abdomen and pelvis was performed using the standard protocol during bolus administration of intravenous contrast. Multiplanar reconstructed images and MIPs were obtained and reviewed to evaluate the vascular anatomy. CONTRAST:  175mL OMNIPAQUE IOHEXOL 350 MG/ML SOLN COMPARISON:  PET-CT 03/28/2019.  Chest CT 03/10/2019. FINDINGS: CTA CHEST FINDINGS Cardiovascular: Heart size is mildly enlarged. There is no significant pericardial fluid, thickening or pericardial calcification. There is aortic atherosclerosis, as well as atherosclerosis of the great vessels of the mediastinum and the coronary arteries, including calcified atherosclerotic plaque in the left main, left anterior descending and right coronary arteries. Status post median sternotomy for aortic valve replacement with a stented bioprosthesis. Mild calcifications of the cusps of the bioprosthetic valve. Mediastinum/Lymph Nodes: No pathologically enlarged mediastinal or hilar lymph nodes. Esophagus is unremarkable in appearance. No axillary lymphadenopathy. Lungs/Pleura: 8 mm cavitary  nodule in the apex of the right upper lobe (axial image 22 of series 16) is stable to decreased in size compared to the prior examination. 9 mm left upper lobe nodule (axial image 32 of series 16), also unchanged. A few other scattered pulmonary nodules measuring 4 mm or less in size are also stable compared to the prior study. No other larger more suspicious appearing pulmonary nodules or masses. No acute consolidative airspace disease. No pleural effusions. Musculoskeletal/Soft Tissues: Median sternotomy wires. Status post right shoulder arthroplasty There are no aggressive appearing lytic or blastic lesions noted in the visualized portions of the skeleton. CTA ABDOMEN AND PELVIS FINDINGS Hepatobiliary: No suspicious cystic or solid hepatic lesions. No intra or extrahepatic biliary ductal dilatation. Calcified granuloma in segment 7 of the liver incidentally noted. Status post cholecystectomy. Pancreas: No pancreatic mass. No pancreatic ductal dilatation. No pancreatic or peripancreatic fluid collections or inflammatory changes. Spleen: Unremarkable. Adrenals/Urinary Tract: Bilateral kidneys and adrenal glands are normal in appearance. No hydroureteronephrosis. Urinary bladder is normal in appearance. Stomach/Bowel: Normal appearance of the stomach. No pathologic dilatation of small bowel or colon. The appendix is not confidently identified and may be surgically absent. Regardless, there are no inflammatory changes noted adjacent to the cecum to suggest the presence of an acute appendicitis at this time. Vascular/Lymphatic: Aortic atherosclerosis, without evidence of aneurysm or dissection in the abdominal or pelvic vasculature. No lymphadenopathy noted in the abdomen or pelvis. Reproductive: Prostate gland and seminal vesicles are unremarkable in appearance. Other: Trace volume of ascites in the low anatomic pelvis. No pneumoperitoneum. Musculoskeletal: There are no aggressive appearing lytic or blastic lesions  noted in the visualized portions of the skeleton. VASCULAR MEASUREMENTS PERTINENT TO TAVR: AORTA: Minimal Aortic Diameter-15 x 13 mm mm Severity of Aortic Calcification-moderate RIGHT PELVIS: Right Common Iliac Artery - Minimal Diameter-9.4 x 8.2 mm Tortuosity-moderate Calcification-moderate Right External Iliac Artery - Minimal Diameter-6.4 x 5.8 mm Tortuosity-moderate Calcification-none Right Common Femoral Artery - Minimal Diameter-7.9 x 7.6 mm Tortuosity-mild Calcification-mild LEFT PELVIS: Left Common Iliac Artery - Minimal Diameter-8.4 x 8.1 mm Tortuosity-severe Calcification-moderate Left External Iliac Artery - Minimal Diameter-5.8 x 5.7 mm Tortuosity-severe Calcification-none Left Common Femoral Artery - Minimal Diameter-8.3 x 7.5 mm Tortuosity-mild Calcification-mild Review of the MIP images confirms the above findings. IMPRESSION: 1. Vascular findings and measurements pertinent to potential TAVR procedure, as detailed above.  2. Status post median sternotomy for aortic valve replacement with bioprosthetic aortic valve, which demonstrates some mild cos calcification. 3. Aortic atherosclerosis, in addition to left main and 2 vessel coronary artery disease. 4. Stable number and size of small pulmonary nodules in the lungs bilaterally, as above. These are favored to be benign, however, attention on repeat noncontrast chest CT in 1 year is recommended to ensure stability. 5. Additional incidental findings, as above. Electronically Signed   By: Vinnie Langton M.D.   On: 04/07/2019 13:51   Vas US Carotid  Result Date: 04/03/2019 Carotid Arterial Duplex Study Indications:       Pre- op. Comparison Study:  no prior Performing Technologist: Abram Sander RVS  Examination Guidelines: A complete evaluation includes B-mode imaging, spectral Doppler, color Doppler, and power Doppler as needed of all accessible portions of each vessel. Bilateral testing is considered an integral part of a complete examination.  Limited examinations for reoccurring indications may be performed as noted.  Right Carotid Findings: +----------+--------+--------+--------+-----------+--------+             PSV cm/s EDV cm/s Stenosis Describe    Comments  +----------+--------+--------+--------+-----------+--------+  CCA Prox   80                         homogeneous           +----------+--------+--------+--------+-----------+--------+  CCA Distal 83       11                homogeneous           +----------+--------+--------+--------+-----------+--------+  ICA Prox   97       16       1-39%    homogeneous           +----------+--------+--------+--------+-----------+--------+  ICA Distal 122      24                                      +----------+--------+--------+--------+-----------+--------+  ECA        74                                               +----------+--------+--------+--------+-----------+--------+ +----------+--------+-------+--------+-------------------+             PSV cm/s EDV cms Describe Arm Pressure (mmHG)  +----------+--------+-------+--------+-------------------+  Subclavian 85                                             +----------+--------+-------+--------+-------------------+ +---------+--------+--+--------+--+---------+  Vertebral PSV cm/s 94 EDV cm/s 10 Antegrade  +---------+--------+--+--------+--+---------+  Left Carotid Findings: +----------+--------+--------+--------+-----------+--------+             PSV cm/s EDV cm/s Stenosis Describe    Comments  +----------+--------+--------+--------+-----------+--------+  CCA Prox   76       10                homogeneous           +----------+--------+--------+--------+-----------+--------+  CCA Distal 60       15                homogeneous           +----------+--------+--------+--------+-----------+--------+  ICA Prox   80       17       1-39%    homogeneous           +----------+--------+--------+--------+-----------+--------+  ICA Distal 115      16                                       +----------+--------+--------+--------+-----------+--------+  ECA        66                                               +----------+--------+--------+--------+-----------+--------+ +----------+--------+--------+--------+-------------------+  Subclavian PSV cm/s EDV cm/s Describe Arm Pressure (mmHG)  +----------+--------+--------+--------+-------------------+             51                                              +----------+--------+--------+--------+-------------------+ +---------+--------+--------+--------------+  Vertebral PSV cm/s EDV cm/s Not identified  +---------+--------+--------+--------------+  Summary: Right Carotid: Velocities in the right ICA are consistent with a 1-39% stenosis. Left Carotid: Velocities in the left ICA are consistent with a 1-39% stenosis. Vertebrals: Right vertebral artery demonstrates antegrade flow. Left vertebral             artery was not visualized. *See table(s) above for measurements and observations.  Electronically signed by Monica Martinez MD on 04/03/2019 at 12:36:34 PM.    Final    Hybrid Or Imaging (mc Only)  Result Date: 04/15/2019 There is no interpretation for this exam.  This order is for images obtained during a surgical procedure.  Please See "Surgeries" Tab for more information regarding the procedure.   Ct Angio Abd/pel W/ And/or W/o  Result Date: 04/07/2019 CLINICAL DATA:  84 year old male with history of severe bioprosthetic aortic valve insufficiency. Preprocedural study prior to potential transcatheter aortic valve replacement (TAVR) procedure. EXAM: CT ANGIOGRAPHY CHEST, ABDOMEN AND PELVIS TECHNIQUE: Multidetector CT imaging through the chest, abdomen and pelvis was performed using the standard protocol during bolus administration of intravenous contrast. Multiplanar reconstructed images and MIPs were obtained and reviewed to evaluate the vascular anatomy. CONTRAST:  124mL OMNIPAQUE IOHEXOL 350 MG/ML SOLN COMPARISON:  PET-CT  03/28/2019.  Chest CT 03/10/2019. FINDINGS: CTA CHEST FINDINGS Cardiovascular: Heart size is mildly enlarged. There is no significant pericardial fluid, thickening or pericardial calcification. There is aortic atherosclerosis, as well as atherosclerosis of the great vessels of the mediastinum and the coronary arteries, including calcified atherosclerotic plaque in the left main, left anterior descending and right coronary arteries. Status post median sternotomy for aortic valve replacement with a stented bioprosthesis. Mild calcifications of the cusps of the bioprosthetic valve. Mediastinum/Lymph Nodes: No pathologically enlarged mediastinal or hilar lymph nodes. Esophagus is unremarkable in appearance. No axillary lymphadenopathy. Lungs/Pleura: 8 mm cavitary nodule in the apex of the right upper lobe (axial image 22 of series 16) is stable to decreased in size compared to the prior examination. 9 mm left upper lobe nodule (axial image 32 of series 16), also unchanged. A few other scattered pulmonary nodules measuring 4 mm or less in size are also stable compared to the prior study. No other larger more suspicious appearing  pulmonary nodules or masses. No acute consolidative airspace disease. No pleural effusions. Musculoskeletal/Soft Tissues: Median sternotomy wires. Status post right shoulder arthroplasty There are no aggressive appearing lytic or blastic lesions noted in the visualized portions of the skeleton. CTA ABDOMEN AND PELVIS FINDINGS Hepatobiliary: No suspicious cystic or solid hepatic lesions. No intra or extrahepatic biliary ductal dilatation. Calcified granuloma in segment 7 of the liver incidentally noted. Status post cholecystectomy. Pancreas: No pancreatic mass. No pancreatic ductal dilatation. No pancreatic or peripancreatic fluid collections or inflammatory changes. Spleen: Unremarkable. Adrenals/Urinary Tract: Bilateral kidneys and adrenal glands are normal in appearance. No  hydroureteronephrosis. Urinary bladder is normal in appearance. Stomach/Bowel: Normal appearance of the stomach. No pathologic dilatation of small bowel or colon. The appendix is not confidently identified and may be surgically absent. Regardless, there are no inflammatory changes noted adjacent to the cecum to suggest the presence of an acute appendicitis at this time. Vascular/Lymphatic: Aortic atherosclerosis, without evidence of aneurysm or dissection in the abdominal or pelvic vasculature. No lymphadenopathy noted in the abdomen or pelvis. Reproductive: Prostate gland and seminal vesicles are unremarkable in appearance. Other: Trace volume of ascites in the low anatomic pelvis. No pneumoperitoneum. Musculoskeletal: There are no aggressive appearing lytic or blastic lesions noted in the visualized portions of the skeleton. VASCULAR MEASUREMENTS PERTINENT TO TAVR: AORTA: Minimal Aortic Diameter-15 x 13 mm mm Severity of Aortic Calcification-moderate RIGHT PELVIS: Right Common Iliac Artery - Minimal Diameter-9.4 x 8.2 mm Tortuosity-moderate Calcification-moderate Right External Iliac Artery - Minimal Diameter-6.4 x 5.8 mm Tortuosity-moderate Calcification-none Right Common Femoral Artery - Minimal Diameter-7.9 x 7.6 mm Tortuosity-mild Calcification-mild LEFT PELVIS: Left Common Iliac Artery - Minimal Diameter-8.4 x 8.1 mm Tortuosity-severe Calcification-moderate Left External Iliac Artery - Minimal Diameter-5.8 x 5.7 mm Tortuosity-severe Calcification-none Left Common Femoral Artery - Minimal Diameter-8.3 x 7.5 mm Tortuosity-mild Calcification-mild Review of the MIP images confirms the above findings. IMPRESSION: 1. Vascular findings and measurements pertinent to potential TAVR procedure, as detailed above. 2. Status post median sternotomy for aortic valve replacement with bioprosthetic aortic valve, which demonstrates some mild cos calcification. 3. Aortic atherosclerosis, in addition to left main and 2 vessel  coronary artery disease. 4. Stable number and size of small pulmonary nodules in the lungs bilaterally, as above. These are favored to be benign, however, attention on repeat noncontrast chest CT in 1 year is recommended to ensure stability. 5. Additional incidental findings, as above. Electronically Signed   By: Vinnie Langton M.D.   On: 04/07/2019 13:51   Disposition   Pt is being discharged home today in good condition.  Follow-up Plans & Appointments    Follow-up Information    Eileen Stanford, PA-C. Go on 04/23/2019.   Specialties: Cardiology, Radiology Why: @ 1:30pm, please arrive at least 10 minutes early.  Contact information: [address] [place] [address] [phone]            Discharge Medications   Allergies as of 04/16/2019   No Known Allergies     Medication List    TAKE these medications   acetaminophen 500 MG tablet Commonly known as: TYLENOL Take 1,000 mg by mouth every 6 (six) hours as needed for moderate pain or headache.   aspirin 81 MG EC tablet Take 1 tablet (81 mg total) by mouth daily.   cholecalciferol 25 MCG (1000 UT) tablet Commonly known as: VITAMIN D3 Take 1,000 Units by mouth daily.   clopidogrel 75 MG tablet Commonly known as: PLAVIX Take 1 tablet (75  mg total) by mouth daily with breakfast. Start taking on: April 16, 8420   folic acid 1 MG tablet Commonly known as: FOLVITE Take 1 mg by mouth daily.   furosemide 40 MG tablet Commonly known as: LASIX Take 40 mg daily. What changed: additional instructions   leflunomide 20 MG tablet Commonly known as: ARAVA Take 20 mg by mouth daily.   MEGARED OMEGA-3 KRILL OIL PO Take 750 mg by mouth daily.   methotrexate 2.5 MG tablet Commonly known as: RHEUMATREX Take 15 mg by mouth every Monday. Caution:Chemotherapy. Protect from light.   metoprolol succinate 25 MG 24 hr tablet Commonly known as: TOPROL-XL Take 1 tablet by mouth once daily    multivitamin with minerals Tabs tablet Take 1 tablet by mouth daily.   oxyCODONE 5 MG immediate release tablet Commonly known as: Roxicodone Take 1 tablet (5 mg total) by mouth every 4 (four) hours as needed for severe pain or breakthrough pain.   pantoprazole 40 MG tablet Commonly known as: PROTONIX Take 40 mg by mouth daily before breakfast.   potassium chloride SA 20 MEQ tablet Commonly known as: K-DUR Take 20 meq daily What changed: additional instructions   predniSONE 5 MG tablet Commonly known as: DELTASONE Take 5 mg by mouth daily with breakfast.   tamsulosin 0.4 MG Caps capsule Commonly known as: FLOMAX Take 0.8 mg by mouth daily.   Theratears 0.25 % Soln Generic drug: Carboxymethylcellulose Sodium Place 1 drop into both eyes 3 (three) times daily as needed (dry/irritated eyes.).   Turmeric 500 MG Caps Take 500 mg by mouth daily.   vitamin B-12 1000 MCG tablet Commonly known as: CYANOCOBALAMIN Take 1,000 mcg by mouth daily.           Outstanding Labs/Studies   BMET, CBC  Duration of Discharge Encounter   Greater than 30 minutes including physician time.  SignedAngelena Form, PA-C 04/16/2019, 9:50 AM [phone]

## 2019-04-18 MED FILL — Magnesium Sulfate Inj 50%: INTRAMUSCULAR | Qty: 10 | Status: AC

## 2019-04-18 MED FILL — Potassium Chloride Inj 2 mEq/ML: INTRAVENOUS | Qty: 40 | Status: AC

## 2019-04-18 MED FILL — Heparin Sodium (Porcine) Inj 1000 Unit/ML: INTRAMUSCULAR | Qty: 30 | Status: AC

## 2019-04-22 NOTE — Progress Notes (Signed)
[facility]                                       Cardiology Office Note    Date:  04/23/2019   ID:  Joshua Hudson, DOB 07-07-1935, MRN [id number]  PCP:  Lavone Orn, MD  Cardiologist: [name], MD / Dr. Burt Knack & Dr. Roxy Manns (TAVR)  CC: TOC s/p TAVR  History of Present Illness:  Joshua Hudson is a 84 y.o. 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   Patient underwent aortic valve replacement using a 25 mm Edwards magna stented bovine pericardial tissue valve by Dr. Servando Snare in 2009 for severe symptomatic aortic stenosis. His early postoperative recovery was notable for postoperative atrial fibrillation which has not recurred. He has done well from a cardiac standpoint and been followed carefully by Dr. Radford Pax. Echocardiogram performed in January of this year revealed normal left ventricular systolic function and mild aortic insufficiency. Over the past 2 to 3 months the patient has developed relatively acute onset of shortness of breath, chest discomfort, orthopnea, and lower extremity edema. Initially symptoms were primarily with physical exertion but symptoms have progressed fairly rapidly such that now the patient gets short of breath with minimal activity and occasionally at rest.   He has had problems with his right shoulder and underwent right shoulder replacement more than 1 year ago. He has had multiple problems ever since with an attempt at revision of his shoulder arthroplasty which subsequently became infected, requiring removal of the hardware and placement of temporary plastic hardware. He has been evaluated  for redo shoulder replacement in the future. He was seen by Estella Husk- PA in the office on 03/18/2019 for preoperative clearance for redo shoulder arthroplasty. He was noted to have significant lower extremity edema as well as other signs and symptoms of acute heart failure. Follow-up TTE on 03/18/2019 revealed normal left ventricular systolic function with at least moderate AI.  Subsequent TEE on 04/02/2019 confirmed the presence of severe prosthetic valve dysfunction with severe AI.  There was no vegetation on the aortic valve she suggest endocarditis. Cardiac catheterization on 04/02/2019 showed normal coronaries. He was also noted to have worsening anemia. Haptoglobin was low c/w hemolytic anemia.   He underwent successful valve in valve TAVR with a 26 mm Edwards Sapien Ultra THV via the TF approach on 04/15/19. Post operative echo showed EF 60-65%, normally functioning TAVR with mean gradient of 14 mmHg and no AI. He was transfused 2 U PRBCs for hemolytic anemia felt to be related to his severe AI. He also developed post op thrombocytopenia with platelets down to 46K. He was discharged on POD1 on aspirin and plavix.  Today he presents to clinic for follow up. He is doing okay. He initially felt a lot better but now back to worsening shortness of breath and fatigue. His daughter is also here and feels like his coloring is back to gray. Still having swelling in his legs and right arm. Cannot sleep well. Hasn't been eating well bc of lack of appetite. Chronicallly sleeps  in an arm chair for comfort. Feels drained. No dizziness or syncope. No blood in his stool or urine.    Past Medical History:  Diagnosis Date  . Aortic insufficiency 03/24/2019   AI of bioprosthetic AVR  . Chronic diastolic CHF (congestive heart failure) ([place]) 03/24/2019  . Colon polyps    s/p diverticular perforation requiring 2-stage repair  . Diverticulosis   . DJD (degenerative joint disease), lumbosacral   . Esophageal  stricture   . GERD (gastroesophageal reflux disease)   . Hemolytic anemia ([place])   . History of blood transfusion    patient states "years ago"  . History of kidney stones    passed stones  . Hypertension   . Mitral regurgitation    moderate  . Occipital neuralgia   . Postoperative atrial fibrillation ([place]) 05/10/2015  . Prosthetic valve dysfunction   . Rheumatoid arthritis ([place])    s/o long term steroids  shoulders and hands  . Rotator cuff arthropathy    right  . S/P aortic valve replacement with bioprosthetic valve 01/16/2008   42mm Edwards Magna perimount bovine pericardial tissue valve, model 3000  . S/P valve-in-valve TAVR 04/15/2019   26 mm Edwards Sapien 3 Ultra transcatheter heart valve placed via percutaneous right transfemoral approach   . SBE (subacute bacterial endocarditis) prophylaxis candidate    for dental procedures  . Severe aortic stenosis    S/P prosthetic valve replacement w 25 mm Edwards like science percardial tissue valve,[place] - 01/2008    Past Surgical History:  Procedure Laterality Date  . APPENDECTOMY    . BOTOX INJECTION N/A 01/22/2018   Procedure: BOTOX INJECTION;  Surgeon: Ronnette Juniper, MD;  Location: [facility];  Service: Gastroenterology;  Laterality: N/A;  . CARDIAC CATHETERIZATION     09  . CARDIAC VALVE REPLACEMENT  01/2008   aortic valve replacement  . CATARACT EXTRACTION W/ INTRAOCULAR LENS  IMPLANT, BILATERAL    . COLON RESECTION     diverticulitis   . dental implants     permanent  . ESOPHAGEAL MANOMETRY N/A 11/07/2017   Procedure: ESOPHAGEAL MANOMETRY (EM);  Surgeon: Ronnette Juniper, MD;  Location: [facility];  Service: Gastroenterology;  Laterality: N/A;  . ESOPHAGOGASTRODUODENOSCOPY (EGD) WITH PROPOFOL N/A 01/22/2018   Procedure: ESOPHAGOGASTRODUODENOSCOPY (EGD) WITH PROPOFOL;  Surgeon: Ronnette Juniper, MD;  Location: [facility];  Service: Gastroenterology;  Laterality: N/A;  . EXCISIONAL TOTAL SHOULDER ARTHROPLASTY WITH ANTIBIOTIC SPACER  Right 12/31/2018   Procedure: EXCISIONAL TOTAL SHOULDER ARTHROPLASTY WITH ANTIBIOTIC SPACER;  Surgeon: Netta Cedars, MD;  Location: [facility];  Service: Orthopedics;  Laterality: Right;  . HERNIA REPAIR    . INTRAOPERATIVE TRANSTHORACIC ECHOCARDIOGRAM N/A 04/15/2019   Procedure: Intraoperative Transthoracic Echocardiogram;  Surgeon: Sherren Mocha, MD;  Location: [facility];  Service: Open Heart Surgery;  Laterality: N/A;  . IRRIGATION AND DEBRIDEMENT SHOULDER Right 11/20/2018    IRRIGATION AND DEBRIDEMENT SHOULDER WITH POLY EXCHANGE (Right Shoulder)  . IRRIGATION AND DEBRIDEMENT SHOULDER Right 11/20/2018   Procedure: IRRIGATION AND DEBRIDEMENT SHOULDER WITH POLY EXCHANGE;  Surgeon: Netta Cedars, MD;  Location: [facility];  Service: Orthopedics;  Laterality: Right;  . LUMBAR LAMINECTOMY     x 2  . REVERSE SHOULDER ARTHROPLASTY Right 03/01/2018   Procedure: RIGHT REVERSE SHOULDER ARTHROPLASTY;  Surgeon: Netta Cedars, MD;  Location: [facility];  Service: Orthopedics;  Laterality: Right;  . RIGHT/LEFT HEART CATH AND CORONARY ANGIOGRAPHY N/A 04/02/2019   Procedure: RIGHT/LEFT HEART CATH AND CORONARY ANGIOGRAPHY;  Surgeon: Leonie Man, MD;  Location: [facility];  Service: Cardiovascular;  Laterality: N/A;  . SAVORY DILATION N/A 01/22/2018   Procedure: SAVORY DILATION;  Surgeon: Ronnette Juniper, MD;  Location: [facility];  Service: Gastroenterology;  Laterality: N/A;  . SHOULDER HEMI-ARTHROPLASTY Right 06/14/2018   Procedure: RIGHT  REVERSE TOTAL SHOULDER OPEN POLY EXCHANGE;  Surgeon: Netta Cedars, MD;  Location: [facility];  Service: Orthopedics;  Laterality: Right;  . TEE WITHOUT CARDIOVERSION N/A 01/29/2014   Procedure: TRANSESOPHAGEAL ECHOCARDIOGRAM (TEE);  Surgeon: Sueanne Margarita, MD;  Location: [facility] ENDOSCOPY;  Service: Cardiovascular;  Laterality: N/A;  . TEE WITHOUT CARDIOVERSION N/A 04/02/2019   Procedure: TRANSESOPHAGEAL ECHOCARDIOGRAM (TEE);  Surgeon: Josue Hector, MD;  Location: [facility] ENDOSCOPY;  Service:  Cardiovascular;  Laterality: N/A;  . TONSILLECTOMY    . TRANSCATHETER AORTIC VALVE REPLACEMENT, TRANSFEMORAL  04/15/2019  . TRANSCATHETER AORTIC VALVE REPLACEMENT, TRANSFEMORAL N/A 04/15/2019   Procedure: TRANSCATHETER AORTIC VALVE REPLACEMENT, TRANSFEMORAL with POST BALLOON DILATION;  Surgeon: Sherren Mocha, MD;  Location: [facility];  Service: Open Heart Surgery;  Laterality: N/A;    Current Medications: Outpatient Medications Prior to Visit  Medication Sig Dispense Refill  . acetaminophen (TYLENOL) 500 MG tablet Take 1,000 mg by mouth every 6 (six) hours as needed for moderate pain or headache.    Marland Kitchen aspirin EC 81 MG EC tablet Take 1 tablet (81 mg total) by mouth daily. 30 tablet   . cholecalciferol (VITAMIN D3) 25 MCG (1000 UT) tablet Take 1,000 Units by mouth daily.    . clopidogrel (PLAVIX) 75 MG tablet Take 1 tablet (75 mg total) by mouth daily with breakfast. 90 tablet 1  . folic acid (FOLVITE) 1 MG tablet Take 1 mg by mouth daily.    . furosemide (LASIX) 40 MG tablet Take 40 mg daily. 90 tablet 3  . leflunomide (ARAVA) 20 MG tablet Take 20 mg by mouth daily.    Marland Kitchen MEGARED OMEGA-3 KRILL OIL PO Take 750 mg by mouth daily.     . methotrexate (RHEUMATREX) 2.5 MG tablet Take 15 mg by mouth every Monday. Caution:Chemotherapy. Protect from light.    . metoprolol succinate (TOPROL-XL) 25 MG 24 hr tablet Take 1 tablet by mouth once daily (Patient taking differently: Take 25 mg by mouth daily. ) 90 tablet 1  . Multiple Vitamin (MULTIVITAMIN WITH MINERALS) TABS tablet Take 1 tablet by mouth daily.    . pantoprazole (PROTONIX) 40 MG tablet Take 40 mg by mouth daily before breakfast.     . potassium chloride SA (K-DUR) 20 MEQ tablet Take 20 meq daily 90 tablet 3  . predniSONE (DELTASONE) 5 MG tablet Take 5 mg by mouth daily with breakfast.     . tamsulosin (FLOMAX) 0.4 MG CAPS capsule Take 0.8 mg by mouth daily.    . THERATEARS 0.25 % SOLN Place 1 drop into both eyes 3 (three) times daily as needed  (dry/irritated eyes.).    Marland Kitchen Turmeric 500 MG CAPS Take 500 mg by mouth daily.    . vitamin B-12 (CYANOCOBALAMIN) 1000 MCG tablet Take 1,000 mcg by mouth daily.    Marland Kitchen oxyCODONE (ROXICODONE) 5 MG immediate release tablet Take 1 tablet (5 mg total) by mouth every 4 (four) hours as needed for severe pain or breakthrough pain. 20 tablet 0   No facility-administered medications prior to visit.      Allergies:   Patient has no known allergies.   Social History   Socioeconomic History  . Marital status: Married    Spouse name: Not on file  .  Number of children: 2  . Years of education: Not on file  . Highest education level: Not on file  Occupational History  . Occupation: Retired Market researcher at [employer]  . Financial resource strain: Not on file  . Food insecurity    Worry: Not on file    Inability: Not on file  . Transportation needs    Medical: Not on file    Non-medical: Not on file  Tobacco Use  . Smoking status: Former Smoker    Packs/day: 0.50    Years: 10.00    Pack years: 5.00    Types: Cigarettes    Start date: 01/16/1974    Quit date: 09/05/1983    Years since quitting: 35.6  . Smokeless tobacco: Former Systems developer    Types: Chew  Substance and Sexual Activity  . Alcohol use: Not Currently    Alcohol/week: 0.0 standard drinks  . Drug use: No  . Sexual activity: Not on file  Lifestyle  . Physical activity    Days per week: Not on file    Minutes per session: Not on file  . Stress: Not on file  Relationships  . Social Herbalist on phone: Not on file    Gets together: Not on file    Attends religious service: Not on file    Active member of club or organization: Not on file    Attends meetings of clubs or organizations: Not on file    Relationship status: Not on file  Other Topics Concern  . Not on file  Social History Narrative  . Not on file     Family History:  The patient's family history includes Arthritis in his father; Heart Problems in his  sister; Heart disease in his father; Other in his brother, daughter, mother, and sister; Suicidality in his brother.     ROS:   Please see the history of present illness.    ROS All other systems reviewed and are negative.   PHYSICAL EXAM:   VS:  BP 99/64   Pulse 60   Ht 5\' 3"  (1.6 m)   Wt 97 lb 12.8 oz (44.4 kg)   BMI 17.32 kg/m    GEN: chronically ill appearing, thin.  HEENT: normal Neck: no JVD or masses Cardiac: RRR; no murmurs, rubs, or gallops. 2+ bilateral pitting edema R>L, right arm swelling in forarm and hand. Respiratory:  clear to auscultation bilaterally, normal work of breathing GI: soft, nontender, nondistended, + BS MS: no deformity or atrophy Skin: warm and dry, no rash.  Groin sites clear without hematoma or ecchymosis  Neuro:  Alert and Oriented x 3, Strength and sensation are intact Psych: euthymic mood, full affect   Wt Readings from Last 3 Encounters:  04/23/19 97 lb 12.8 oz (44.4 kg)  04/16/19 104 lb 4.8 oz (47.3 kg)  04/11/19 (P) 109 lb (49.4 kg)     Studies/Labs Reviewed:   EKG:  EKG is ordered today.  The ekg ordered today demonstrates sinus with HR 60 bpm  Recent Labs: 03/18/2019: NT-Pro BNP 3,511 04/11/2019: ALT 30; B Natriuretic Peptide 834.7 04/16/2019: BUN 16; Creatinine, Ser 1.08; Hemoglobin 10.1; Magnesium 1.9; Platelets 46; Potassium 4.3; Sodium 139   Lipid Panel    Component Value Date/Time   CHOL  01/14/2008 0450    174        ATP III CLASSIFICATION:  <200     mg/dL   Desirable  200-239  mg/dL   Borderline High  >=  240    mg/dL   High   TRIG 43 01/14/2008 0450   HDL 65 01/14/2008 0450   CHOLHDL 2.7 01/14/2008 0450   VLDL 9 01/14/2008 0450   LDLCALC (H) 01/14/2008 0450    100        Total Cholesterol/HDL:CHD Risk Coronary Heart Disease Risk Table                     Men   Women  1/2 Average Risk   3.4   3.3    Additional studies/ records that were reviewed today include:   TAVR OPERATIVE NOTE   Date of  Procedure:04/15/2019  Preoperative Diagnosis:  S/P Aortic Valve Replacement using Stented Bovine Pericardial Tissue Valve  Prosthetic Valve Dysfunction  Severe Aortic Insufficiency  Hemolytic Anemia  Postoperative Diagnosis:Same   Procedure:   Valve-in-ValveTranscatheter Aortic Valve Replacement - PercutaneousRightTransfemoral Approach Edwards Sapien 3 UltraTHV (size 70mm, model # 9750TFX, serial [id number])  Co-Surgeons:Clarence H. Roxy Manns, MD and Sherren Mocha, MD  Anesthesiologist:John Lissa Hoard, MD  Echocardiographer:Peter Johnsie Cancel, MD  Pre-operative Echo Findings: ? Severeprosthetic valveaorticinsufficiency ? Normalleft ventricular systolic function  Post-operative Echo Findings: ? Noparavalvular leak ? Unchangedleft ventricular systolic function   _____________    Echo 04/16/19: IMPRESSIONS  1. The left ventricle has normal systolic function with an ejection fraction of 60-65%. The cavity size was normal. Left ventricular diastolic Doppler parameters are indeterminate.  2. The right ventricle has normal systolic function. The cavity was normal.  3. Left atrial size was mildly dilated.  4. The mitral valve is abnormal. Mild thickening of the mitral valve leaflet. There is mild mitral annular calcification present.  5. The tricuspid valve is grossly normal.  6. The aorta is normal in size and structure.  7. The interatrial septum is aneurysmal.  8. Normal LV systolic function; s/p TAVR with mean gradient of 14 mmHg and no AI; mild MR; mild LAE; mild TR.   ASSESSMENT & PLAN:   Prosthetic valve dysfunction s/p valve in valve TAVR: doing okay. Initially felt better but now with worsening shortness of breath and fatigue. ECG with sinus and no HAVB. Groin sites are healing well. Continue aspirin and plavix.  Acute on chronic diastolic  CHF: has persistent 2+ bilateral LE edema R>L, also right arm swelling likely related to orthopedic shoulder issues. No JVD and lungs are clear. Plan to check BMET and BNP. Increase lasix 40mg  / kdur 20 meq daily to BID x 3 days then go back to daily. I have asked him to elevate legs when possible and try compression stockings although his daughter said she doubted he would.   Hemolytic anemia: he was transfused 2 U in the hospital and felt better. Now feeling worse again. Will check CBC today.   Thrombocytopenia: PLTs down to 46K at discharge. Will check CBC  Protein calorie malnutrition: patient down to 97 lbs. Not eating or drinking well. Protein levels low on recent CMET. Albumin in low normal range. I have asked him to try to increase the protein in his diet and supplement with a nutritional shake.  Insomnia: not sleeping well. I have recommended melatonin or other OTC medications.   Pulmonary nodules: stable number and size of small pulmonary nodules in the lungs bilaterally. These are favored to be benign, however, attention on repeat noncontrast chest CT in 1 year is recommended to ensure stability ( this is being actively followed by Dr. Laurann Montana with recent PET scan that was reassuring but showed hypermetabolic activity in  the anus.) Will defer to Dr. Laurann Montana.   Medication Adjustments/Labs and Tests Ordered: Current medicines are reviewed at length with the patient today.  Concerns regarding medicines are outlined above.  Medication changes, Labs and Tests ordered today are listed in the Patient Instructions below. Patient Instructions  Medication Instructions:  1) INCREASE LASIX to 40 mg twice daily for 3 DAYS. Then resume 40 mg daily. 2) INCREASE POTASSIUM to 20 meq twice daily with your Lasix. Then resume 20 meq daily 3) Try Melatonin 3 mg 1 hour prior to sleeping. You could also try Unisom or Benadryl. All three are over the counter.  Labwork: TODAY: BMET, BNP, CBC  Follow-Up:  Please keep your follow-up appointment for your echocardiogram on 05/14/2019. Please arrive at 1:35PM for check-in.  Please keep your VIRTUAL appointment with Nell Range, PA on 05/15/2019. You will be called about 15 minutes prior to your appointment to review medications and vital signs (BP, HR) you are able to get at home. Then Joellen Jersey will contact you for your appointment. You will not come to the office.  Other information: Please try to increase protein in your diet. You may want to try a daily Ensure.    Signed, Joshua Form, PA-C  04/23/2019 3:35 PM    [facility] [address] [place], [place], [place]  [address] Phone: [phone]; Fax: [phone]

## 2019-04-23 ENCOUNTER — Telehealth: Payer: Self-pay

## 2019-04-23 ENCOUNTER — Ambulatory Visit (INDEPENDENT_AMBULATORY_CARE_PROVIDER_SITE_OTHER): Payer: Medicare Other | Admitting: Physician Assistant

## 2019-04-23 ENCOUNTER — Encounter: Payer: Self-pay | Admitting: Physician Assistant

## 2019-04-23 ENCOUNTER — Other Ambulatory Visit: Payer: Self-pay

## 2019-04-23 VITALS — BP 99/64 | HR 60 | Ht 63.0 in | Wt 97.8 lb

## 2019-04-23 DIAGNOSIS — G4700 Insomnia, unspecified: Secondary | ICD-10-CM | POA: Diagnosis not present

## 2019-04-23 DIAGNOSIS — R918 Other nonspecific abnormal finding of lung field: Secondary | ICD-10-CM

## 2019-04-23 DIAGNOSIS — D696 Thrombocytopenia, unspecified: Secondary | ICD-10-CM | POA: Diagnosis not present

## 2019-04-23 DIAGNOSIS — D598 Other acquired hemolytic anemias: Secondary | ICD-10-CM

## 2019-04-23 DIAGNOSIS — I5032 Chronic diastolic (congestive) heart failure: Secondary | ICD-10-CM

## 2019-04-23 DIAGNOSIS — Z952 Presence of prosthetic heart valve: Secondary | ICD-10-CM | POA: Diagnosis not present

## 2019-04-23 NOTE — Patient Instructions (Addendum)
Medication Instructions:  1) INCREASE LASIX to 40 mg twice daily for 3 DAYS. Then resume 40 mg daily. 2) INCREASE POTASSIUM to 20 meq twice daily with your Lasix. Then resume 20 meq daily 3) Try Melatonin 3 mg 1 hour prior to sleeping. You could also try Unisom or Benadryl. All three are over the counter.  Labwork: TODAY: BMET, BNP, CBC  Follow-Up: Please keep your follow-up appointment for your echocardiogram on 05/14/2019. Please arrive at 1:35PM for check-in.  Please keep your VIRTUAL appointment with Nell Range, PA on 05/15/2019. You will be called about 15 minutes prior to your appointment to review medications and vital signs (BP, HR) you are able to get at home. Then Joellen Jersey will contact you for your appointment. You will not come to the office.  Other information: Please try to increase protein in your diet. You may want to try a daily Ensure.

## 2019-04-23 NOTE — Telephone Encounter (Signed)
The patient is in today for visit with Joshua Hudson. He is scheduled for virtual visit 9/10. His daughter, Joshua Hudson, will be with him and will help with a VIDEO visit. Consent obtained:     Virtual Visit Pre-Appointment Phone Call Confirm consent - "In the setting of the current Covid19 crisis, you are scheduled for a video visit with your provider.  Just as we do with many in-office visits, in order for you to participate in this visit, we must obtain consent.  If you'd like, I can send this to your mychart (if signed up) or email for you to review.  Otherwise, I can obtain your verbal consent now.  All virtual visits are billed to your insurance company just like a normal visit would be.  By agreeing to a virtual visit, we'd like you to understand that the technology does not allow for your provider to perform an examination, and thus may limit your provider's ability to fully assess your condition. If your provider identifies any concerns that need to be evaluated in person, we will make arrangements to do so.  Finally, though the technology is pretty good, we cannot assure that it will always work on either your or our end, and in the setting of a video visit, we may have to convert it to a phone-only visit.  In either situation, we cannot ensure that we have a secure connection.  Are you willing to proceed?" STAFF: Did the patient verbally acknowledge consent to telehealth visit? Document YES/NO here: YES     TELEPHONE CALL NOTE  Joshua Hudson has been deemed a candidate for a follow-up tele-health visit to limit community exposure during the Covid-19 pandemic. I spoke with the patient via phone to ensure availability of phone/video source, confirm preferred email & phone number, and discuss instructions and expectations.  I reminded Joshua Hudson to be prepared with any vital sign and/or heart rhythm information that could potentially be obtained via home monitoring, at the time of his visit. I  reminded Joshua Hudson to expect a phone call prior to his visit.  Joshua Parma, RN 04/23/2019 2:35 PM   INSTRUCTIONS FOR DOWNLOADING THE MYCHART APP TO SMARTPHONE  - The patient must first make sure to have activated MyChart and know their login information - If Apple, go to [employer] and type in MyChart in the search bar and download the app. If Android, ask patient to go to [employer] and type in [facility] in the search bar and download the app. The app is free but as with any other app downloads, their phone may require them to verify saved payment information or Apple/Android password.  - The patient will need to then log into the app with their MyChart username and password, and select [facility] as their healthcare provider to link the account. When it is time for your visit, go to the MyChart app, find appointments, and click Begin Video Visit. Be sure to Select Allow for your device to access the Microphone and Camera for your visit. You will then be connected, and your provider will be with you shortly.  **If they have any issues connecting, or need assistance please contact MyChart service desk [phone])**  **If using a computer, in order to ensure the best quality for their visit they will need to use either of the following Internet Browsers: [employer], or Google Chrome**  IF USING DOXIMITY or DOXY.ME - The patient will receive a link  just prior to their visit by text.     FULL LENGTH CONSENT FOR TELE-HEALTH VISIT   I hereby voluntarily request, consent and authorize [facility] and its employed or contracted physicians, physician assistants, nurse practitioners or other licensed health care professionals (the Practitioner), to provide me with telemedicine health care services (the "Services") as deemed necessary by the treating Practitioner. I acknowledge and consent to receive the Services by the Practitioner via telemedicine. I understand  that the telemedicine visit will involve communicating with the Practitioner through live audiovisual communication technology and the disclosure of certain medical information by electronic transmission. I acknowledge that I have been given the opportunity to request an in-person assessment or other available alternative prior to the telemedicine visit and am voluntarily participating in the telemedicine visit.  I understand that I have the right to withhold or withdraw my consent to the use of telemedicine in the course of my care at any time, without affecting my right to future care or treatment, and that the Practitioner or I may terminate the telemedicine visit at any time. I understand that I have the right to inspect all information obtained and/or recorded in the course of the telemedicine visit and may receive copies of available information for a reasonable fee.  I understand that some of the potential risks of receiving the Services via telemedicine include:  [place] Delay or interruption in medical evaluation due to technological equipment failure or disruption; . Information transmitted may not be sufficient (e.g. poor resolution of images) to allow for appropriate medical decision making by the Practitioner; and/or  . In rare instances, security protocols could fail, causing a breach of personal health information.  Furthermore, I acknowledge that it is my responsibility to provide information about my medical history, conditions and care that is complete and accurate to the best of my ability. I acknowledge that Practitioner's advice, recommendations, and/or decision may be based on factors not within their control, such as incomplete or inaccurate data provided by me or distortions of diagnostic images or specimens that may result from electronic transmissions. I understand that the practice of medicine is not an exact science and that Practitioner makes no warranties or guarantees regarding  treatment outcomes. I acknowledge that I will receive a copy of this consent concurrently upon execution via email to the email address I last provided but may also request a printed copy by calling the office of [facility].    I understand that my insurance will be billed for this visit.   I have read or had this consent read to me. . I understand the contents of this consent, which adequately explains the benefits and risks of the Services being provided via telemedicine.  . I have been provided ample opportunity to ask questions regarding this consent and the Services and have had my questions answered to my satisfaction. . I give my informed consent for the services to be provided through the use of telemedicine in my medical care  By participating in this telemedicine visit I agree to the above.

## 2019-04-24 LAB — CBC WITH DIFFERENTIAL/PLATELET
Basophils Absolute: 0 10*3/uL (ref 0.0–0.2)
Basos: 1 %
EOS (ABSOLUTE): 0.5 10*3/uL — ABNORMAL HIGH (ref 0.0–0.4)
Eos: 10 %
Hematocrit: 29.3 % — ABNORMAL LOW (ref 37.5–51.0)
Hemoglobin: 9.1 g/dL — ABNORMAL LOW (ref 13.0–17.7)
Immature Grans (Abs): 0 10*3/uL (ref 0.0–0.1)
Immature Granulocytes: 1 %
Lymphocytes Absolute: 0.4 10*3/uL — ABNORMAL LOW (ref 0.7–3.1)
Lymphs: 9 %
MCH: 24.9 pg — ABNORMAL LOW (ref 26.6–33.0)
MCHC: 31.1 g/dL — ABNORMAL LOW (ref 31.5–35.7)
MCV: 80 fL (ref 79–97)
Monocytes Absolute: 0.3 10*3/uL (ref 0.1–0.9)
Monocytes: 5 %
Neutrophils Absolute: 3.9 10*3/uL (ref 1.4–7.0)
Neutrophils: 74 %
Platelets: 74 10*3/uL — CL (ref 150–450)
RBC: 3.65 x10E6/uL — ABNORMAL LOW (ref 4.14–5.80)
RDW: 22.3 % — ABNORMAL HIGH (ref 11.6–15.4)
WBC: 5.2 10*3/uL (ref 3.4–10.8)

## 2019-04-24 LAB — BASIC METABOLIC PANEL
BUN/Creatinine Ratio: 21 (ref 10–24)
BUN: 24 mg/dL (ref 8–27)
CO2: 30 mmol/L — ABNORMAL HIGH (ref 20–29)
Calcium: 9.9 mg/dL (ref 8.6–10.2)
Chloride: 97 mmol/L (ref 96–106)
Creatinine, Ser: 1.15 mg/dL (ref 0.76–1.27)
GFR calc Af Amer: 68 mL/min/{1.73_m2} (ref >59)
GFR calc non Af Amer: 59 mL/min/{1.73_m2} — ABNORMAL LOW (ref >59)
Glucose: 114 mg/dL — ABNORMAL HIGH (ref 65–99)
Potassium: 4.3 mmol/L (ref 3.5–5.2)
Sodium: 139 mmol/L (ref 134–144)

## 2019-04-24 LAB — PRO B NATRIURETIC PEPTIDE: NT-Pro BNP: 1582 pg/mL — ABNORMAL HIGH (ref 0–486)

## 2019-04-28 ENCOUNTER — Other Ambulatory Visit: Payer: Self-pay

## 2019-04-28 ENCOUNTER — Telehealth: Payer: Self-pay | Admitting: Physician Assistant

## 2019-04-28 ENCOUNTER — Inpatient Hospital Stay (HOSPITAL_COMMUNITY): Payer: Medicare Other

## 2019-04-28 ENCOUNTER — Encounter (HOSPITAL_COMMUNITY): Payer: Self-pay | Admitting: Emergency Medicine

## 2019-04-28 ENCOUNTER — Emergency Department (HOSPITAL_COMMUNITY): Payer: Medicare Other

## 2019-04-28 ENCOUNTER — Inpatient Hospital Stay (HOSPITAL_COMMUNITY)
Admission: EM | Admit: 2019-04-28 | Discharge: 2019-05-02 | DRG: 391 | Disposition: A | Discharge status: Home / Self Care | Payer: Medicare Other | Attending: Internal Medicine | Admitting: Internal Medicine

## 2019-04-28 DIAGNOSIS — R1310 Dysphagia, unspecified: Secondary | ICD-10-CM

## 2019-04-28 DIAGNOSIS — R627 Adult failure to thrive: Secondary | ICD-10-CM | POA: Diagnosis present

## 2019-04-28 DIAGNOSIS — M47817 Spondylosis without myelopathy or radiculopathy, lumbosacral region: Secondary | ICD-10-CM | POA: Diagnosis present

## 2019-04-28 DIAGNOSIS — R64 Cachexia: Secondary | ICD-10-CM | POA: Diagnosis present

## 2019-04-28 DIAGNOSIS — N179 Acute kidney failure, unspecified: Secondary | ICD-10-CM | POA: Diagnosis present

## 2019-04-28 DIAGNOSIS — I350 Nonrheumatic aortic (valve) stenosis: Secondary | ICD-10-CM | POA: Diagnosis not present

## 2019-04-28 DIAGNOSIS — K219 Gastro-esophageal reflux disease without esophagitis: Secondary | ICD-10-CM | POA: Diagnosis present

## 2019-04-28 DIAGNOSIS — K5790 Diverticulosis of intestine, part unspecified, without perforation or abscess without bleeding: Secondary | ICD-10-CM | POA: Diagnosis present

## 2019-04-28 DIAGNOSIS — K222 Esophageal obstruction: Secondary | ICD-10-CM | POA: Insufficient documentation

## 2019-04-28 DIAGNOSIS — Z9049 Acquired absence of other specified parts of digestive tract: Secondary | ICD-10-CM

## 2019-04-28 DIAGNOSIS — Z87891 Personal history of nicotine dependence: Secondary | ICD-10-CM

## 2019-04-28 DIAGNOSIS — N400 Enlarged prostate without lower urinary tract symptoms: Secondary | ICD-10-CM | POA: Diagnosis present

## 2019-04-28 DIAGNOSIS — I340 Nonrheumatic mitral (valve) insufficiency: Secondary | ICD-10-CM | POA: Diagnosis present

## 2019-04-28 DIAGNOSIS — Z681 Body mass index (BMI) 19 or less, adult: Secondary | ICD-10-CM

## 2019-04-28 DIAGNOSIS — Z9842 Cataract extraction status, left eye: Secondary | ICD-10-CM

## 2019-04-28 DIAGNOSIS — I5032 Chronic diastolic (congestive) heart failure: Secondary | ICD-10-CM | POA: Diagnosis present

## 2019-04-28 DIAGNOSIS — Z7952 Long term (current) use of systemic steroids: Secondary | ICD-10-CM | POA: Diagnosis not present

## 2019-04-28 DIAGNOSIS — I10 Essential (primary) hypertension: Secondary | ICD-10-CM | POA: Diagnosis not present

## 2019-04-28 DIAGNOSIS — Z9841 Cataract extraction status, right eye: Secondary | ICD-10-CM

## 2019-04-28 DIAGNOSIS — I110 Hypertensive heart disease with heart failure: Secondary | ICD-10-CM | POA: Diagnosis present

## 2019-04-28 DIAGNOSIS — Z20828 Contact with and (suspected) exposure to other viral communicable diseases: Secondary | ICD-10-CM | POA: Diagnosis present

## 2019-04-28 DIAGNOSIS — E869 Volume depletion, unspecified: Secondary | ICD-10-CM | POA: Diagnosis present

## 2019-04-28 DIAGNOSIS — N289 Disorder of kidney and ureter, unspecified: Secondary | ICD-10-CM | POA: Diagnosis not present

## 2019-04-28 DIAGNOSIS — M069 Rheumatoid arthritis, unspecified: Secondary | ICD-10-CM | POA: Diagnosis present

## 2019-04-28 DIAGNOSIS — Z7982 Long term (current) use of aspirin: Secondary | ICD-10-CM | POA: Diagnosis not present

## 2019-04-28 DIAGNOSIS — K220 Achalasia of cardia: Secondary | ICD-10-CM | POA: Diagnosis present

## 2019-04-28 DIAGNOSIS — Z961 Presence of intraocular lens: Secondary | ICD-10-CM | POA: Diagnosis present

## 2019-04-28 DIAGNOSIS — Z8249 Family history of ischemic heart disease and other diseases of the circulatory system: Secondary | ICD-10-CM

## 2019-04-28 DIAGNOSIS — Z96611 Presence of right artificial shoulder joint: Secondary | ICD-10-CM | POA: Diagnosis present

## 2019-04-28 DIAGNOSIS — Z8261 Family history of arthritis: Secondary | ICD-10-CM

## 2019-04-28 DIAGNOSIS — D589 Hereditary hemolytic anemia, unspecified: Secondary | ICD-10-CM | POA: Diagnosis present

## 2019-04-28 DIAGNOSIS — Z23 Encounter for immunization: Secondary | ICD-10-CM | POA: Diagnosis present

## 2019-04-28 DIAGNOSIS — D638 Anemia in other chronic diseases classified elsewhere: Secondary | ICD-10-CM | POA: Diagnosis present

## 2019-04-28 DIAGNOSIS — Z79899 Other long term (current) drug therapy: Secondary | ICD-10-CM

## 2019-04-28 DIAGNOSIS — Z953 Presence of xenogenic heart valve: Secondary | ICD-10-CM

## 2019-04-28 DIAGNOSIS — D61818 Other pancytopenia: Secondary | ICD-10-CM | POA: Diagnosis present

## 2019-04-28 DIAGNOSIS — Z8719 Personal history of other diseases of the digestive system: Secondary | ICD-10-CM

## 2019-04-28 DIAGNOSIS — J479 Bronchiectasis, uncomplicated: Secondary | ICD-10-CM | POA: Diagnosis present

## 2019-04-28 DIAGNOSIS — E43 Unspecified severe protein-calorie malnutrition: Secondary | ICD-10-CM | POA: Diagnosis present

## 2019-04-28 DIAGNOSIS — Z7902 Long term (current) use of antithrombotics/antiplatelets: Secondary | ICD-10-CM | POA: Diagnosis not present

## 2019-04-28 DIAGNOSIS — Z87442 Personal history of urinary calculi: Secondary | ICD-10-CM

## 2019-04-28 DIAGNOSIS — R531 Weakness: Secondary | ICD-10-CM

## 2019-04-28 LAB — COMPREHENSIVE METABOLIC PANEL
ALT: 19 U/L (ref 0–44)
AST: 38 U/L (ref 15–41)
Albumin: 3.8 g/dL (ref 3.5–5.0)
Alkaline Phosphatase: 79 U/L (ref 38–126)
Anion gap: 14 (ref 5–15)
BUN: 28 mg/dL — ABNORMAL HIGH (ref 8–23)
CO2: 30 mmol/L (ref 22–32)
Calcium: 9.7 mg/dL (ref 8.9–10.3)
Chloride: 95 mmol/L — ABNORMAL LOW (ref 98–111)
Creatinine, Ser: 1.52 mg/dL — ABNORMAL HIGH (ref 0.61–1.24)
GFR calc Af Amer: 49 mL/min — ABNORMAL LOW (ref >60)
GFR calc non Af Amer: 42 mL/min — ABNORMAL LOW (ref >60)
Glucose, Bld: 104 mg/dL — ABNORMAL HIGH (ref 70–99)
Potassium: 4 mmol/L (ref 3.5–5.1)
Sodium: 139 mmol/L (ref 135–145)
Total Bilirubin: 1.6 mg/dL — ABNORMAL HIGH (ref 0.3–1.2)
Total Protein: 6.4 g/dL — ABNORMAL LOW (ref 6.5–8.1)

## 2019-04-28 LAB — CBC WITH DIFFERENTIAL/PLATELET
Abs Immature Granulocytes: 0.03 10*3/uL (ref 0.00–0.07)
Basophils Absolute: 0 10*3/uL (ref 0.0–0.1)
Basophils Relative: 1 %
Eosinophils Absolute: 0.2 10*3/uL (ref 0.0–0.5)
Eosinophils Relative: 3 %
HCT: 29.6 % — ABNORMAL LOW (ref 39.0–52.0)
Hemoglobin: 9.4 g/dL — ABNORMAL LOW (ref 13.0–17.0)
Immature Granulocytes: 1 %
Lymphocytes Relative: 7 %
Lymphs Abs: 0.4 10*3/uL — ABNORMAL LOW (ref 0.7–4.0)
MCH: 24.5 pg — ABNORMAL LOW (ref 26.0–34.0)
MCHC: 31.8 g/dL (ref 30.0–36.0)
MCV: 77.3 fL — ABNORMAL LOW (ref 80.0–100.0)
Monocytes Absolute: 0.6 10*3/uL (ref 0.1–1.0)
Monocytes Relative: 10 %
Neutro Abs: 4.7 10*3/uL (ref 1.7–7.7)
Neutrophils Relative %: 78 %
Platelets: 90 10*3/uL — ABNORMAL LOW (ref 150–400)
RBC: 3.83 MIL/uL — ABNORMAL LOW (ref 4.22–5.81)
RDW: 23.3 % — ABNORMAL HIGH (ref 11.5–15.5)
WBC: 5.9 10*3/uL (ref 4.0–10.5)
nRBC: 0.3 % — ABNORMAL HIGH (ref 0.0–0.2)

## 2019-04-28 LAB — URINALYSIS, ROUTINE W REFLEX MICROSCOPIC
Bilirubin Urine: NEGATIVE
Glucose, UA: NEGATIVE mg/dL
Hgb urine dipstick: NEGATIVE
Ketones, ur: 20 mg/dL — AB
Leukocytes,Ua: NEGATIVE
Nitrite: NEGATIVE
Protein, ur: 30 mg/dL — AB
Specific Gravity, Urine: 1.017 (ref 1.005–1.030)
pH: 5 (ref 5.0–8.0)

## 2019-04-28 LAB — I-STAT CHEM 8, ED
BUN: 34 mg/dL — ABNORMAL HIGH (ref 8–23)
Calcium, Ion: 0.88 mmol/L — CL (ref 1.15–1.40)
Chloride: 98 mmol/L (ref 98–111)
Creatinine, Ser: 1.4 mg/dL — ABNORMAL HIGH (ref 0.61–1.24)
Glucose, Bld: 98 mg/dL (ref 70–99)
HCT: 33 % — ABNORMAL LOW (ref 39.0–52.0)
Hemoglobin: 11.2 g/dL — ABNORMAL LOW (ref 13.0–17.0)
Potassium: 4 mmol/L (ref 3.5–5.1)
Sodium: 137 mmol/L (ref 135–145)
TCO2: 33 mmol/L — ABNORMAL HIGH (ref 22–32)

## 2019-04-28 LAB — TROPONIN I (HIGH SENSITIVITY)
Troponin I (High Sensitivity): 19 ng/L — ABNORMAL HIGH (ref <18)
Troponin I (High Sensitivity): 19 ng/L — ABNORMAL HIGH (ref <18)

## 2019-04-28 LAB — SARS CORONAVIRUS 2 (TAT 6-24 HRS): SARS Coronavirus 2: NEGATIVE

## 2019-04-28 LAB — BRAIN NATRIURETIC PEPTIDE: B Natriuretic Peptide: 332 pg/mL — ABNORMAL HIGH (ref 0.0–100.0)

## 2019-04-28 MED ORDER — PREDNISONE 5 MG PO TABS
5.0000 mg | ORAL_TABLET | Freq: Every day | ORAL | Status: DC
  Administered 2019-04-29 – 2019-05-02 (×4): 5 mg via ORAL
  Filled 2019-04-28 (×5): qty 1

## 2019-04-28 MED ORDER — ENSURE ENLIVE PO LIQD: 237.0000 mL | Freq: Two times a day (BID) | ORAL | Status: DC

## 2019-04-28 MED ORDER — ACETAMINOPHEN 500 MG PO TABS: 1000.0000 mg | ORAL_TABLET | Freq: Four times a day (QID) | ORAL | Status: DC | PRN

## 2019-04-28 MED ORDER — LEFLUNOMIDE 20 MG PO TABS
20.0000 mg | ORAL_TABLET | Freq: Every day | ORAL | Status: DC
  Administered 2019-04-28 – 2019-05-01 (×4): 20 mg via ORAL
  Filled 2019-04-28 (×5): qty 1

## 2019-04-28 MED ORDER — ACETAMINOPHEN 650 MG RE SUPP: 650.0000 mg | Freq: Four times a day (QID) | RECTAL | Status: DC | PRN

## 2019-04-28 MED ORDER — PANTOPRAZOLE SODIUM 40 MG PO TBEC
40.0000 mg | DELAYED_RELEASE_TABLET | Freq: Every day | ORAL | Status: DC
  Administered 2019-04-29 – 2019-05-02 (×4): 40 mg via ORAL
  Filled 2019-04-28 (×5): qty 1

## 2019-04-28 MED ORDER — ENOXAPARIN SODIUM 30 MG/0.3ML ~~LOC~~ SOLN
30.0000 mg | SUBCUTANEOUS | Status: DC
  Administered 2019-04-28: 30 mg via SUBCUTANEOUS
  Filled 2019-04-28: qty 0.3

## 2019-04-28 MED ORDER — POLYVINYL ALCOHOL 1.4 % OP SOLN
1.0000 [drp] | Freq: Three times a day (TID) | OPHTHALMIC | Status: DC | PRN
  Administered 2019-05-01: 09:00:00 1 [drp] via OPHTHALMIC
  Filled 2019-04-28: qty 15

## 2019-04-28 MED ORDER — ASPIRIN EC 81 MG PO TBEC
81.0000 mg | DELAYED_RELEASE_TABLET | Freq: Every day | ORAL | Status: DC
  Administered 2019-04-29 – 2019-05-02 (×4): 81 mg via ORAL
  Filled 2019-04-28 (×6): qty 1

## 2019-04-28 MED ORDER — SODIUM CHLORIDE 0.9 % IV SOLN
INTRAVENOUS | Status: DC
  Administered 2019-04-28 – 2019-04-30 (×2): via INTRAVENOUS

## 2019-04-28 MED ORDER — ACETAMINOPHEN-CODEINE #3 300-30 MG PO TABS
1.0000 | ORAL_TABLET | Freq: Four times a day (QID) | ORAL | Status: DC | PRN
  Administered 2019-04-28: 1 via ORAL
  Filled 2019-04-28: qty 1

## 2019-04-28 MED ORDER — MELATONIN 3 MG PO TABS
1.0000 | ORAL_TABLET | Freq: Every day | ORAL | Status: DC
  Administered 2019-04-28 – 2019-05-01 (×4): 3 mg via ORAL
  Filled 2019-04-28 (×5): qty 1

## 2019-04-28 MED ORDER — ACETAMINOPHEN 325 MG PO TABS: 650.0000 mg | ORAL_TABLET | Freq: Four times a day (QID) | ORAL | Status: DC | PRN

## 2019-04-28 MED ORDER — PNEUMOCOCCAL VAC POLYVALENT 25 MCG/0.5ML IJ INJ
0.5000 mL | INJECTION | INTRAMUSCULAR | Status: AC
  Administered 2019-04-29: 0.5 mL via INTRAMUSCULAR
  Filled 2019-04-28: qty 0.5

## 2019-04-28 MED ORDER — TAMSULOSIN HCL 0.4 MG PO CAPS
0.8000 mg | ORAL_CAPSULE | Freq: Every day | ORAL | Status: DC
  Administered 2019-04-28 – 2019-05-02 (×5): 0.8 mg via ORAL
  Filled 2019-04-28 (×6): qty 2

## 2019-04-28 MED ORDER — FOLIC ACID 1 MG PO TABS
1.0000 mg | ORAL_TABLET | Freq: Every day | ORAL | Status: DC
  Administered 2019-04-28 – 2019-05-02 (×5): 1 mg via ORAL
  Filled 2019-04-28 (×6): qty 1

## 2019-04-28 MED ORDER — METHOTREXATE 2.5 MG PO TABS: 15.0000 mg | ORAL_TABLET | ORAL | Status: DC

## 2019-04-28 MED ORDER — VITAMIN B-12 1000 MCG PO TABS
1000.0000 ug | ORAL_TABLET | Freq: Every day | ORAL | Status: DC
  Administered 2019-04-28 – 2019-05-02 (×5): 1000 ug via ORAL
  Filled 2019-04-28 (×6): qty 1

## 2019-04-28 MED ORDER — SODIUM CHLORIDE 0.9 % IV BOLUS
1000.0000 mL | Freq: Once | INTRAVENOUS | Status: AC
  Administered 2019-04-28: 1000 mL via INTRAVENOUS

## 2019-04-28 MED ORDER — ACETAMINOPHEN 325 MG PO TABS
650.0000 mg | ORAL_TABLET | Freq: Once | ORAL | Status: AC
  Administered 2019-04-28: 12:00:00 650 mg via ORAL
  Filled 2019-04-28: qty 2

## 2019-04-28 MED ORDER — METOPROLOL SUCCINATE ER 25 MG PO TB24
25.0000 mg | ORAL_TABLET | Freq: Every day | ORAL | Status: DC
  Administered 2019-04-29 – 2019-05-02 (×4): 25 mg via ORAL
  Filled 2019-04-28 (×5): qty 1

## 2019-04-28 MED ORDER — CLOPIDOGREL BISULFATE 75 MG PO TABS
75.0000 mg | ORAL_TABLET | Freq: Every day | ORAL | Status: DC
  Administered 2019-04-29 – 2019-05-02 (×4): 75 mg via ORAL
  Filled 2019-04-28 (×5): qty 1

## 2019-04-28 NOTE — H&P (Addendum)
History and Physical:    YOUSOF Hudson   [name] DOB: 26-Jan-1935 DOA: 04/28/2019  Referring MD/provider: PA Irene Pap PCP: Lavone Orn, MD   Patient coming from: Home  Chief Complaint: Weakness and fatigue x 6 days.  History of Present Illness:   Joshua Hudson is an 84 y.o. male medical history significant for esophageal stricture as post dilation May 2019, left ear status post TAVR 2 weeks ago, MR, HFpEF, RA on prednisone and MTX who now presents with progressive weakness and fatigue.  History is per patient and his daughter.  Patient states he was in his usual state of health until 2 weeks prior to admission when he was admitted for TAVR which he underwent without complication.  He noted that he was tired after his TAVR but was generally getting around as per usual.  He was seen in follow-up 6 days ago and states that since his follow-up appointment he has really not done well.  Patient states that he has had severe fatigue and weakness.  He notes this he has had difficulty swallowing so has not had a full meal since Saturday.  He also complains of right-sided intrascapular pain which he says he has had in the past however is much much worse right now.  The main reason patient comes to the hospital today is because he is unable to get out and about as per usual.  Normally he is able to walk around his house but now he states that he is so weak it is difficult just to walk to the bathroom.  Patient denies any shortness of breath.  In fact patient states his shortness of breath is much improved since his TAVR such that it is not a problem for him anymore.  No chest pain.  No cough.  No fevers chills or malaise.  No RA flare as far as he is aware.  No nausea vomiting or diarrhea.  Denies any dizziness syncope or presyncope.  No falls.  Patient notes he was unable to take his pills this morning because "they will not go down ".  States that he has not been able to even drink Ensure  since Saturday because "it will not stay down, I have to spit it back up again".  ED Course:  The patient was noted to be cachectic, normotensive and have mild renal insufficiency.  And is admitted because he is unable to take p.o.'s at all.  ROS:   ROS   Review of Systems: General: No fever, chills, weight changes Eyes: no discharge, redness, pain HENT: no ear pain, hearing loss, drainage, tinnitus Endocrine: no heat/cold intolerance, no polyuria Respiratory: Positive cough,, shortness of breath, hemoptysis Cardiovascular: No palpitations, chest pain GI: No nausea, vomiting, diarrhea, constipation GU: No dysuria, increased frequency CNS: No numbness, dizziness, headache Musculoskeletal: He does have right-sided intrascapular pain as noted above.  He is also complaining of discoloration in his right foot which he is not sure why.  Denies any injury as far as he knows.  No pain in his right lower extremity at all. Blood/lymphatics: She notes he always has easy bruisability due to his Plavix.   Mood/affect: No anxiety/depression  different from baseline.   Past Medical History:   Past Medical History:  Diagnosis Date  . Aortic insufficiency 03/24/2019   AI of bioprosthetic AVR  . Chronic diastolic CHF (congestive heart failure) ([place]) 03/24/2019  . Colon polyps    s/p diverticular perforation requiring 2-stage repair  . Diverticulosis   .  DJD (degenerative joint disease), lumbosacral   . Esophageal stricture   . GERD (gastroesophageal reflux disease)   . Hemolytic anemia ([place])   . History of blood transfusion    patient states "years ago"  . History of kidney stones    passed stones  . Hypertension   . Mitral regurgitation    moderate  . Occipital neuralgia   . Postoperative atrial fibrillation ([place]) 05/10/2015  . Prosthetic valve dysfunction   . Rheumatoid arthritis ([place])    s/o long term steroids  shoulders and hands  . Rotator cuff arthropathy    right  . S/P aortic  valve replacement with bioprosthetic valve 01/16/2008   37mm Edwards Magna perimount bovine pericardial tissue valve, model 3000  . S/P valve-in-valve TAVR 04/15/2019   26 mm Edwards Sapien 3 Ultra transcatheter heart valve placed via percutaneous right transfemoral approach   . SBE (subacute bacterial endocarditis) prophylaxis candidate    for dental procedures  . Severe aortic stenosis    S/P prosthetic valve replacement w 25 mm Edwards like science percardial tissue valve,[place] - 01/2008    Past Surgical History:   Past Surgical History:  Procedure Laterality Date  . APPENDECTOMY    . BOTOX INJECTION N/A 01/22/2018   Procedure: BOTOX INJECTION;  Surgeon: Ronnette Juniper, MD;  Location: [facility];  Service: Gastroenterology;  Laterality: N/A;  . CARDIAC CATHETERIZATION     09  . CARDIAC VALVE REPLACEMENT  01/2008   aortic valve replacement  . CATARACT EXTRACTION W/ INTRAOCULAR LENS  IMPLANT, BILATERAL    . COLON RESECTION     diverticulitis   . dental implants     permanent  . ESOPHAGEAL MANOMETRY N/A 11/07/2017   Procedure: ESOPHAGEAL MANOMETRY (EM);  Surgeon: Ronnette Juniper, MD;  Location: [facility];  Service: Gastroenterology;  Laterality: N/A;  . ESOPHAGOGASTRODUODENOSCOPY (EGD) WITH PROPOFOL N/A 01/22/2018   Procedure: ESOPHAGOGASTRODUODENOSCOPY (EGD) WITH PROPOFOL;  Surgeon: Ronnette Juniper, MD;  Location: [facility];  Service: Gastroenterology;  Laterality: N/A;  . EXCISIONAL TOTAL SHOULDER ARTHROPLASTY WITH ANTIBIOTIC SPACER Right 12/31/2018   Procedure: EXCISIONAL TOTAL SHOULDER ARTHROPLASTY WITH ANTIBIOTIC SPACER;  Surgeon: Netta Cedars, MD;  Location: [place];  Service: Orthopedics;  Laterality: Right;  . HERNIA REPAIR    . INTRAOPERATIVE TRANSTHORACIC ECHOCARDIOGRAM N/A 04/15/2019   Procedure: Intraoperative Transthoracic Echocardiogram;  Surgeon: Sherren Mocha, MD;  Location: [place];  Service: Open Heart Surgery;  Laterality: N/A;  . IRRIGATION AND DEBRIDEMENT SHOULDER Right  11/20/2018    IRRIGATION AND DEBRIDEMENT SHOULDER WITH POLY EXCHANGE (Right Shoulder)  . IRRIGATION AND DEBRIDEMENT SHOULDER Right 11/20/2018   Procedure: IRRIGATION AND DEBRIDEMENT SHOULDER WITH POLY EXCHANGE;  Surgeon: Netta Cedars, MD;  Location: [place];  Service: Orthopedics;  Laterality: Right;  . LUMBAR LAMINECTOMY     x 2  . REVERSE SHOULDER ARTHROPLASTY Right 03/01/2018   Procedure: RIGHT REVERSE SHOULDER ARTHROPLASTY;  Surgeon: Netta Cedars, MD;  Location: [place];  Service: Orthopedics;  Laterality: Right;  . RIGHT/LEFT HEART CATH AND CORONARY ANGIOGRAPHY N/A 04/02/2019   Procedure: RIGHT/LEFT HEART CATH AND CORONARY ANGIOGRAPHY;  Surgeon: Leonie Man, MD;  Location: [place] [facility];  Service: Cardiovascular;  Laterality: N/A;  . SAVORY DILATION N/A 01/22/2018   Procedure: SAVORY DILATION;  Surgeon: Ronnette Juniper, MD;  Location: [facility];  Service: Gastroenterology;  Laterality: N/A;  . SHOULDER HEMI-ARTHROPLASTY Right 06/14/2018   Procedure: RIGHT  REVERSE TOTAL SHOULDER OPEN POLY EXCHANGE;  Surgeon: Netta Cedars, MD;  Location: [place];  Service: Orthopedics;  Laterality: Right;  . TEE WITHOUT CARDIOVERSION N/A 01/29/2014   Procedure: TRANSESOPHAGEAL ECHOCARDIOGRAM (TEE);  Surgeon: Sueanne Margarita, MD;  Location: [facility] END[facility];  Service: Cardiovascular;  Laterality: N/A;  . TEE WITHOUT CARDIOVERSION N/A 04/02/2019   Procedure: TRANSESOPHAGEAL ECHOCARDIOGRAM (TEE);  Surgeon: Josue Hector, MD;  Location[facility] ENDOSCOPY;  Service: Cardiovascular;  Laterality: N/A;  . TONSILLECTOMY    . TRANSCATHETER AORTIC VALVE REPLACEMENT, TRANSFEMORAL  04/15/2019  . TRANSCATHETER AORTIC VALVE REPLACEMENT, TRANSFEMORAL N/A 04/15/2019   Procedure: TRANSCATHETER AORTIC VALVE REPLACEMENT, TRANSFEMORAL with POST BALLOON DILATION;  Surgeon: Sherren Mocha, MD;  Location: [place];  Service: Open Heart Surgery;  Laterality: N/A;    Social History:   Social History   Socioeconomic History  . Marital  status: Married    Spouse name: Not on file  . Number of children: 2  . Years of education: Not on file  . Highest education level: Not on file  Occupational History  . Occupation: Retired Market researcher at [employer]  . Financial resource strain: Not on file  . Food insecurity    Worry: Not on file    Inability: Not on file  . Transportation needs    Medical: Not on file    Non-medical: Not on file  Tobacco Use  . Smoking status: Former Smoker    Packs/day: 0.50    Years: 10.00    Pack years: 5.00    Types: Cigarettes    Start date: 01/16/1974    Quit date: 09/05/1983    Years since quitting: 35.6  . Smokeless tobacco: Former Systems developer    Types: Chew  Substance and Sexual Activity  . Alcohol use: Not Currently    Alcohol/week: 0.0 standard drinks  . Drug use: No  . Sexual activity: Not on file  Lifestyle  . Physical activity    Days per week: Not on file    Minutes per session: Not on file  . Stress: Not on file  Relationships  . Social Herbalist on phone: Not on file    Gets together: Not on file    Attends religious service: Not on file    Active member of club or organization: Not on file    Attends meetings of clubs or organizations: Not on file    Relationship status: Not on file  . Intimate partner violence    Fear of current or ex partner: Not on file    Emotionally abused: Not on file    Physically abused: Not on file    Forced sexual activity: Not on file  Other Topics Concern  . Not on file  Social History Narrative  . Not on file    Allergies   Patient has no known allergies.  Family history:   Family History  Problem Relation Age of Onset  . Other Mother        NO HEALTH PROBLEMS  . Heart disease Father   . Arthritis Father   . Heart Problems Sister        RELATED TO A MVA  . Suicidality Brother   . Other Sister        [place]  . Other Brother        2 [place]  . Other Daughter        2 IN GOOD  HEALTH    Current Medications:   Prior to Admission medications   Medication Sig Start Date End Date Taking?  Authorizing Provider  acetaminophen (TYLENOL) 500 MG tablet Take 1,000 mg by mouth every 6 (six) hours as needed for moderate pain or headache.    [provider]  aspirin EC 81 MG EC tablet Take 1 tablet (81 mg total) by mouth daily. 01/02/14   Sueanne Margarita, MD  cholecalciferol (VITAMIN D3) 25 MCG (1000 UT) tablet Take 1,000 Units by mouth daily.    [provider]  clopidogrel (PLAVIX) 75 MG tablet Take 1 tablet (75 mg total) by mouth daily with breakfast. 04/17/19   Eileen Stanford, PA-C  folic acid (FOLVITE) 1 MG tablet Take 1 mg by mouth daily.    [provider]  furosemide (LASIX) 40 MG tablet Take 40 mg daily. 04/16/19   Eileen Stanford, PA-C  leflunomide (ARAVA) 20 MG tablet Take 20 mg by mouth daily.    [provider]  MEGARED OMEGA-3 KRILL OIL PO Take 750 mg by mouth daily.     [provider]  methotrexate (RHEUMATREX) 2.5 MG tablet Take 15 mg by mouth every Monday. Caution:Chemotherapy. Protect from light.    [provider]  metoprolol succinate (TOPROL-XL) 25 MG 24 hr tablet Take 1 tablet by mouth once daily Patient taking differently: Take 25 mg by mouth daily.  03/17/19   Sueanne Margarita, MD  Multiple Vitamin (MULTIVITAMIN WITH MINERALS) TABS tablet Take 1 tablet by mouth daily.    [provider]  pantoprazole (PROTONIX) 40 MG tablet Take 40 mg by mouth daily before breakfast.     [provider]  potassium chloride SA (K-DUR) 20 MEQ tablet Take 20 meq daily 04/16/19   Eileen Stanford, PA-C  predniSONE (DELTASONE) 5 MG tablet Take 5 mg by mouth daily with breakfast.     [provider]  tamsulosin (FLOMAX) 0.4 MG CAPS capsule Take 0.8 mg by mouth daily.    [provider]  THERATEARS 0.25 % SOLN Place 1 drop into both eyes 3 (three) times daily as needed (dry/irritated  eyes.).    [provider]  Turmeric 500 MG CAPS Take 500 mg by mouth daily.    [provider]  vitamin B-12 (CYANOCOBALAMIN) 1000 MCG tablet Take 1,000 mcg by mouth daily.    [provider]    Physical Exam:   Vitals:   04/28/19 1016 04/28/19 1025 04/28/19 1045  BP: 120/66    Pulse: 70  64  Resp: 18  17  Temp: 98.3 F (36.8 C)    TempSrc: Oral    SpO2: 100%  100%  Weight:  39.9 kg   Height:  5\' 3"  (1.6 m)      Physical Exam: Blood pressure 120/66, pulse 64, temperature 98.3 F (36.8 C), temperature source Oral, resp. rate 17, height 5\' 3"  (1.6 m), weight 39.9 kg, SpO2 100 %. Gen: Extremely emaciated man sitting in no acute distress with normal respiratory rate.  Attentive daughters at bedside.  He has very bitemporal wasting, deep orbital sockets and supraclavicular spaces with very visible rib cage. Eyes: Sclerae anicteric. Conjunctiva mildly injected. Chest: Decreased air entry bilaterally no adventitious sounds. Back: He does have tenderness to palpation right intrascapular region lateral to T6-8.  No rashes noted.  Tenderness along the T-spine or along the ribs.  Pain is clearly in the soft tissue area. CV: Distant, regular, 2/6 systolic murmur.   Abdomen: Calf wound, NABS, soft, nondistended, nontender. No tenderness to light or deep palpation. No rebound, no guarding. Extremities: Noninflamed enlargement of MCPs  bilaterally without any ulnar deviation.  Does have 1+ edema below his left ankle with some ankle bogginess without evidence of acute inflammation.  Right foot has some ecchymoses that appears to be dependent draining of blood with ecchymoses around his toes heels and below his ankle.  There is no tenderness to palpation on his foot at all.  Has full range of motion of his ankles with normal strength bilaterally. Neuro: Alert and oriented times 3; grossly nonfocal. Psych: Patient is cooperative, logical and coherent with appropriate mood and  affect.  Data Review:    Labs: Basic Metabolic Panel: Recent Labs  Lab 04/23/19 1432 04/28/19 1052 04/28/19 1108  NA 139 139 137  K 4.3 4.0 4.0  CL 97 95* 98  CO2 30* 30  --   GLUCOSE 114* 104* 98  BUN 24 28* 34*  CREATININE 1.15 1.52* 1.40*  CALCIUM 9.9 9.7  --    Liver Function Tests: Recent Labs  Lab 04/28/19 1052  AST 38  ALT 19  ALKPHOS 79  BILITOT 1.6*  PROT 6.4*  ALBUMIN 3.8   No results for input(s): LIPASE, AMYLASE in the last 168 hours. No results for input(s): AMMONIA in the last 168 hours. CBC: Recent Labs  Lab 04/23/19 1432 04/28/19 1052 04/28/19 1108  WBC 5.2 5.9  --   NEUTROABS 3.9 4.7  --   HGB 9.1* 9.4* 11.2*  HCT 29.3* 29.6* 33.0*  MCV 80 77.3*  --   PLT 74* 90*  --    Cardiac Enzymes: No results for input(s): CKTOTAL, CKMB, CKMBINDEX, TROPONINI in the last 168 hours.  BNP (last 3 results) Recent Labs    03/18/19 1542 04/23/19 1432  PROBNP 3,511* 1,582*   CBG: No results for input(s): GLUCAP in the last 168 hours.  Urinalysis    Component Value Date/Time   COLORURINE YELLOW 04/11/2019 1336   APPEARANCEUR CLEAR 04/11/2019 1336   LABSPEC 1.020 04/11/2019 1336   PHURINE 6.0 04/11/2019 1336   GLUCOSEU NEGATIVE 04/11/2019 1336   HGBUR NEGATIVE 04/11/2019 1336   BILIRUBINUR NEGATIVE 04/11/2019 1336   KETONESUR 5 (A) 04/11/2019 1336   PROTEINUR 30 (A) 04/11/2019 1336   UROBILINOGEN 0.2 01/14/2008 0530   NITRITE NEGATIVE 04/11/2019 1336   LEUKOCYTESUR NEGATIVE 04/11/2019 1336      Radiographic Studies: Dg Chest Port 1 View  Result Date: 04/28/2019 CLINICAL DATA:  Shortness of breath EXAM: PORTABLE CHEST 1 VIEW COMPARISON:  April 15, 2019 FINDINGS: There is no edema or consolidation. Heart size and pulmonary vascularity are normal. No adenopathy. Patient is status post aortic valve replacement. There is aortic atherosclerosis. There is a total shoulder replacement on right with apparent anterior dislocation on right, stable.  IMPRESSION: No edema or consolidation. Heart size normal. Status post aortic valve replacement. Chronic anterior dislocation right shoulder with postoperative change in this area. Aortic Atherosclerosis (ICD10-I70.0). Electronically Signed   By: Lowella Grip III M.D.   On: 04/28/2019 11:17    EKG: Independently reviewed.  Sinus rhythm at 60 with first-degree heart block.  Low voltage in precordial leads.  Left axis deviation.  Wandering baseline.  No acute ST-T wave changes noted.   Assessment/Plan:   Active Problems:   S/P aortic valve replacement with bioprosthetic valve   Dysphagia   Mitral regurgitation   Hypertension   Rheumatoid arthritis (HCC)   GERD (gastroesophageal reflux disease)   FTT (failure to thrive) in adult   History of esophageal stricture   Odynophagia  84 year old man with known history  of esophageal stricture last dilated May 2019 presents with acute fatigue and weakness and dysphagia with inability to take orals or solids for 6 days.  He is two-weeks status post TAVR.  Work-up reveals an extremely emaciated man who is normotensive and has some mild renal insufficiency.  FTT Most likely secondary to intravascular fluid depletion and ARI likely related to ongoing diuretic use while not being able to maintain adequate food and fluid intake.  Will treat with gentle hydration and follow symptoms closely.  ARI Hold diuretics and provide gentle fluids.  DYSPHAGIA Known history of stricture status post dilation May 2019. Place patient on dysphagia 3 diet Disussed with Dr. Oletta Lamas of [facility] recommends a barium swallow which I have ordered.   Patient had seen Dr. Therisa Doyne previously May 2019 and unfortunately had been lost to follow-up since then.  Parts of her notes are below:  "EGD on 10/25/17 which showed normal esophageal biopsies , no EOE, no celiac and no H pylori, but the lumen of upper esophagus was dilated and benign appearing intrinsic stenosis was noted at  40 cm, dilated with a 20 mm balloon, and 1 superficial DU. He had an esophageal manometry on 11/07/17 which showed elevated LES resting and relaxation pressure, one failed persistalsis wtih panesophageal pressurization, but not diagnostic of achalasia due to intact peristalsis on majority of swallows. EGJ outflow obstructin noted and botox injection to LES was recommended. He reports improvement in his symptoms by 80 percent. Discussed about repeating EGD with balloon dilatation versus doing EGD with Botox injection on a regular schedule basis. After detailed discussion patient would like to be scheduled for an EGD with botox injection at the end of 5/19."  HFpEF No evidence of acute decompensation.  I am holding patient's steroids as I believe he is intravascularly volume depleted.  Will need to follow lung exam closely as we hydrate gently.  INTRASCAPULAR PAIN  Patient has pain in the soft tissue area right of his T6-T8.  No tenderness along palpation of the spine or ribs.  Do not think this is a compression fracture although he may well have radiculopathy given known DJD of L-spine although the tenderness elicited makes it seem less like radiculopathy and more like a soft tissue strain.  He may also have impending zoster which has not yet erupted.  Note patient is status post right shoulder replacement 2019.  S/P TAVR Patient was seen by cardiology last week and they are aware he has been admitted.  Patient's shortness of breath is markedly improved since his TAVR.   HTN Tinea metoprolol per home doses.  Hold Lasix.  RA Continue steroids, MTX and leflunomide  BPH Continue tamsulosin  GERD Continue pantoprazole  H/O AFIB S/p ablation w/o recurrence  BRONCHIECTASIS ON CT SCAN Asymptomatic   Other information:   DVT prophylaxis: Lovenox ordered. Code Status: Full code. Family Communication: His daughter was at bedside throughout history and physical Disposition Plan: Home  Consults called: [facility] gastroenterology, spoke with Dr. Oletta Lamas. Admission status: Inpatient  The medical decision making on this patient was of high complexity and the patient is at high risk for clinical deterioration, therefore this is a level 3 visit.   Dewaine Oats [place] Koenen [facility]  If 7PM-7AM, please contact night-coverage [email or web address] Password [email or web address] 04/28/2019, 1:06 PM

## 2019-04-28 NOTE — ED Provider Notes (Signed)
[facility] EMERGENCY DEPARTMENT Provider Note   CSN: [id number] Arrival date & time: 04/28/19  1012     History   Chief Complaint Chief Complaint  Patient presents with   Weakness    HPI SELDEN ROMO is a 84 y.o. male.     84 y.o male with an extensive PMH including CHF, GERD, Hemolytic anemia, Mitral regur, s/p TAVR on 04/15/2019 presents to the ED with a chief complaint of generalized weakness x two weeks. Patient reports he's had decrease eating, had lost 10 lbs in 1 week with a weight of 88 pounds today.  Patient was seen last week in office for his follow-up, according to notes he did not look well, had signs of volume overloaded, had diuretics adjusted.  Patient has persistently had weakness along with failure to thrive.  According to cardiology PAs note patient did have some new dysphasia and has not been eating.  Today he reports some pain under his right shoulder, states he has taken pain medication at home but there is no improvement of it.  He also endorses weakness, states this is worse upon standing, states he has had to sit down in order not to fall.  Denies any fevers, chest pain or shortness of breath.     Patient's daughter, Kathee Delton, called in to report him not doing very well. He has had progressive weakness and failure to thrive. He has new dysphagia and has not been eating, weigh down to 88 lbs. He has back pain under his shoulder that has not been relieved by home pain medication. I have asked him to be seen and they are planning to come to [facility] ED.    The history is provided by the patient and medical records.  Weakness Associated symptoms: dizziness and myalgias   Associated symptoms: no abdominal pain, no chest pain, no diarrhea, no fever, no nausea, no shortness of breath and no vomiting     Past Medical History:  Diagnosis Date   Aortic insufficiency 03/24/2019   AI of bioprosthetic AVR   Chronic diastolic CHF (congestive heart  failure) ([place]) 03/24/2019   Colon polyps    s/p diverticular perforation requiring 2-stage repair   Diverticulosis    DJD (degenerative joint disease), lumbosacral    Esophageal stricture    GERD (gastroesophageal reflux disease)    Hemolytic anemia (HCC)    History of blood transfusion    patient states "years ago"   History of kidney stones    passed stones   Hypertension    Mitral regurgitation    moderate   Occipital neuralgia    Postoperative atrial fibrillation ([place]) 05/10/2015   Prosthetic valve dysfunction    Rheumatoid arthritis (HCC)    s/o long term steroids  shoulders and hands   Rotator cuff arthropathy    right   S/P aortic valve replacement with bioprosthetic valve 01/16/2008   33mm Edwards Magna perimount bovine pericardial tissue valve, model 3000   S/P valve-in-valve TAVR 04/15/2019   26 mm Edwards Sapien 3 Ultra transcatheter heart valve placed via percutaneous right transfemoral approach    SBE (subacute bacterial endocarditis) prophylaxis candidate    for dental procedures   Severe aortic stenosis    S/P prosthetic valve replacement w 25 mm Edwards like science percardial tissue valve,[place] - 01/2008    Patient Active Problem List   Diagnosis Date Noted   Hypertension    Rheumatoid arthritis ([place])    S/P valve-in-valve TAVR    Prosthetic  valve dysfunction    Severe aortic regurgitation 04/02/2019   Mitral regurgitation 05/16/2016   Postoperative atrial fibrillation ([place]) 05/10/2015   Hemolytic anemia ([place]) 02/25/2014   Acute on chronic diastolic heart failure ([place]) 02/25/2014   Other dysphagia 02/25/2014   S/P aortic valve replacement with bioprosthetic valve 01/16/2008    Past Surgical History:  Procedure Laterality Date   APPENDECTOMY     BOTOX INJECTION N/A 01/22/2018   Procedure: BOTOX INJECTION;  Surgeon: Ronnette Juniper, MD;  Location: [facility];  Service: Gastroenterology;  Laterality: N/A;   CARDIAC  CATHETERIZATION     09   CARDIAC VALVE REPLACEMENT  01/2008   aortic valve replacement   CATARACT EXTRACTION W/ INTRAOCULAR LENS  IMPLANT, BILATERAL     COLON RESECTION     diverticulitis    dental implants     permanent   ESOPHAGEAL MANOMETRY N/A 11/07/2017   Procedure: ESOPHAGEAL MANOMETRY (EM);  Surgeon: Ronnette Juniper, MD;  Location: [facility];  Service: Gastroenterology;  Laterality: N/A;   ESOPHAGOGASTRODUODENOSCOPY (EGD) WITH PROPOFOL N/A 01/22/2018   Procedure: ESOPHAGOGASTRODUODENOSCOPY (EGD) WITH PROPOFOL;  Surgeon: Ronnette Juniper, MD;  Location: [facility];  Service: Gastroenterology;  Laterality: N/A;   EXCISIONAL TOTAL SHOULDER ARTHROPLASTY WITH ANTIBIOTIC SPACER Right 12/31/2018   Procedure: EXCISIONAL TOTAL SHOULDER ARTHROPLASTY WITH ANTIBIOTIC SPACER;  Surgeon: Netta Cedars, MD;  Location: [facility];  Service: Orthopedics;  Laterality: Right;   HERNIA REPAIR     INTRAOPERATIVE TRANSTHORACIC ECHOCARDIOGRAM N/A 04/15/2019   Procedure: Intraoperative Transthoracic Echocardiogram;  Surgeon: Sherren Mocha, MD;  Location: [facility];  Service: Open Heart Surgery;  Laterality: N/A;   IRRIGATION AND DEBRIDEMENT SHOULDER Right 11/20/2018    IRRIGATION AND DEBRIDEMENT SHOULDER WITH POLY EXCHANGE (Right Shoulder)   IRRIGATION AND DEBRIDEMENT SHOULDER Right 11/20/2018   Procedure: IRRIGATION AND DEBRIDEMENT SHOULDER WITH POLY EXCHANGE;  Surgeon: Netta Cedars, MD;  Location: [place];  Service: Orthopedics;  Laterality: Right;   LUMBAR LAMINECTOMY     x 2   REVERSE SHOULDER ARTHROPLASTY Right 03/01/2018   Procedure: RIGHT REVERSE SHOULDER ARTHROPLASTY;  Surgeon: Netta Cedars, MD;  Location: [place];  Service: Orthopedics;  Laterality: Right;   RIGHT/LEFT HEART CATH AND CORONARY ANGIOGRAPHY N/A 04/02/2019   Procedure: RIGHT/LEFT HEART CATH AND CORONARY ANGIOGRAPHY;  Surgeon: Leonie Man, MD;  Location: [place] [facility];  Service: Cardiovascular;  Laterality: N/A;   SAVORY DILATION  N/A 01/22/2018   Procedure: SAVORY DILATION;  Surgeon: Ronnette Juniper, MD;  Location: [facility];  Service: Gastroenterology;  Laterality: N/A;   SHOULDER HEMI-ARTHROPLASTY Right 06/14/2018   Procedure: RIGHT  REVERSE TOTAL SHOULDER OPEN POLY EXCHANGE;  Surgeon: Netta Cedars, MD;  Location: [place];  Service: Orthopedics;  Laterality: Right;   TEE WITHOUT CARDIOVERSION N/A 01/29/2014   Procedure: TRANSESOPHAGEAL ECHOCARDIOGRAM (TEE);  Surgeon: Sueanne Margarita, MD;  Location: [facility] ENDOSCOPY;  Service: Cardiovascular;  Laterality: N/A;   TEE WITHOUT CARDIOVERSION N/A 04/02/2019   Procedure: TRANSESOPHAGEAL ECHOCARDIOGRAM (TEE);  Surgeon: Josue [place], MD;  Location: [facility] ENDOSCOPY;  Service: Cardiovascular;  Laterality: N/A;   TONSILLECTOMY     TRANSCATHETER AORTIC VALVE REPLACEMENT, TRANSFEMORAL  04/15/2019   TRANSCATHETER AORTIC VALVE REPLACEMENT, TRANSFEMORAL N/A 04/15/2019   Procedure: TRANSCATHETER AORTIC VALVE REPLACEMENT, TRANSFEMORAL with POST BALLOON DILATION;  Surgeon: Sherren Mocha, MD;  Location: [place];  Service: Open Heart Surgery;  Laterality: N/A;        Home Medications    Prior to Admission medications   Medication Sig Start Date End Date Taking? Authorizing Provider  acetaminophen (TYLENOL) 500 MG tablet Take 1,000 mg by mouth every 6 (six) hours as needed for moderate pain or headache.    [provider]  aspirin EC 81 MG EC tablet Take 1 tablet (81 mg total) by mouth daily. 01/02/14   Sueanne Margarita, MD  cholecalciferol (VITAMIN D3) 25 MCG (1000 UT) tablet Take 1,000 Units by mouth daily.    [provider]  clopidogrel (PLAVIX) 75 MG tablet Take 1 tablet (75 mg total) by mouth daily with breakfast. 04/17/19   Eileen Stanford, PA-C  folic acid (FOLVITE) 1 MG tablet Take 1 mg by mouth daily.    [provider]  furosemide (LASIX) 40 MG tablet Take 40 mg daily. 04/16/19   Eileen Stanford, PA-C  leflunomide (ARAVA) 20 MG tablet Take 20 mg by  mouth daily.    [provider]  MEGARED OMEGA-3 KRILL OIL PO Take 750 mg by mouth daily.     [provider]  methotrexate (RHEUMATREX) 2.5 MG tablet Take 15 mg by mouth every Monday. Caution:Chemotherapy. Protect from light.    [provider]  metoprolol succinate (TOPROL-XL) 25 MG 24 hr tablet Take 1 tablet by mouth once daily Patient taking differently: Take 25 mg by mouth daily.  03/17/19   Sueanne Margarita, MD  Multiple Vitamin (MULTIVITAMIN WITH MINERALS) TABS tablet Take 1 tablet by mouth daily.    [provider]  pantoprazole (PROTONIX) 40 MG tablet Take 40 mg by mouth daily before breakfast.     [provider]  potassium chloride SA (K-DUR) 20 MEQ tablet Take 20 meq daily 04/16/19   Eileen Stanford, PA-C  predniSONE (DELTASONE) 5 MG tablet Take 5 mg by mouth daily with breakfast.     [provider]  tamsulosin (FLOMAX) 0.4 MG CAPS capsule Take 0.8 mg by mouth daily.    [provider]  THERATEARS 0.25 % SOLN Place 1 drop into both eyes 3 (three) times daily as needed (dry/irritated eyes.).    [provider]  Turmeric 500 MG CAPS Take 500 mg by mouth daily.    [provider]  vitamin B-12 (CYANOCOBALAMIN) 1000 MCG tablet Take 1,000 mcg by mouth daily.    [provider]    Family History Family History  Problem Relation Age of Onset   Other Mother        NO HEALTH PROBLEMS   Heart disease Father    Arthritis Father    Heart Problems Sister        RELATED TO A MVA   Suicidality Brother    Other Sister        3 SISTERS IN GOOD HEALTH   Other Brother        2 BROTHERS IN GOOD HEALTH   Other Daughter        2 IN GOOD HEALTH    Social History Social History   Tobacco Use   Smoking status: Former Smoker    Packs/day: 0.50    Years: 10.00    Pack years: 5.00    Types: Cigarettes    Start date: 01/16/1974    Quit date: 09/05/1983    Years since quitting: 35.6    Smokeless tobacco: Former Systems developer    Types: Chew  Substance Use Topics   Alcohol use: Not Currently    Alcohol/week: 0.0 standard drinks   Drug use: No     Allergies   Patient has no known allergies.   Review of Systems  Review of Systems  Constitutional: Negative for fever.  Respiratory: Negative for shortness of breath.   Cardiovascular: Negative for chest pain.  Gastrointestinal: Negative for abdominal pain, diarrhea, nausea and vomiting.  Genitourinary: Negative for flank pain.  Musculoskeletal: Positive for back pain and myalgias. Negative for neck pain and neck stiffness.  Neurological: Positive for dizziness and weakness.     Physical Exam Updated Vital Signs BP 120/66 (BP Location: Left Arm)    Pulse 64    Temp 98.3 F (36.8 C) (Oral)    Resp 17    Ht 5\' 3"  (1.6 m)    Wt 39.9 kg    SpO2 100%    BMI 15.59 kg/m   Physical Exam Vitals signs and nursing note reviewed.  Constitutional:      Appearance: He is well-developed.     Comments: Cachectic, acute on chronic ill-appearing.  HENT:     Head: Normocephalic and atraumatic.  Eyes:     General: No scleral icterus.    Pupils: Pupils are equal, round, and reactive to light.  Neck:     Musculoskeletal: Normal range of motion.  Cardiovascular:     Heart sounds: Normal heart sounds.     Comments: Swelling noted to bilateral lower extremities. Pulmonary:     Effort: Pulmonary effort is normal.     Breath sounds: Examination of the right-lower field reveals rales. Examination of the left-lower field reveals rales. Decreased breath sounds and rales present. No wheezing.     Comments: Lung sounds are decreased on bilateral lower lung fields. Chest:     Chest wall: No tenderness.  Abdominal:     General: Bowel sounds are normal. There is no distension.     Palpations: Abdomen is soft.     Tenderness: There is no abdominal tenderness.  Musculoskeletal:        General: No deformity.     Thoracic back: He exhibits  tenderness.       Back:  Skin:    General: Skin is warm and dry.  Neurological:     Mental Status: He is alert and oriented to person, place, and time.     Comments: Patient does appear weaker on the right side, does have difficulty with bilateral lower extremities extension but not flexion. No dysarthria, no facial asymmetry of my exam.      ED Treatments / Results  Labs (all labs ordered are listed, but only abnormal results are displayed) Labs Reviewed  CBC WITH DIFFERENTIAL/PLATELET - Abnormal; Notable for the following components:      Result Value   RBC 3.83 (*)    Hemoglobin 9.4 (*)    HCT 29.6 (*)    MCV 77.3 (*)    MCH 24.5 (*)    RDW 23.3 (*)    Platelets 90 (*)    nRBC 0.3 (*)    Lymphs Abs 0.4 (*)    All other components within normal limits  COMPREHENSIVE METABOLIC PANEL - Abnormal; Notable for the following components:   Chloride 95 (*)    Glucose, Bld 104 (*)    BUN 28 (*)    Creatinine, Ser 1.52 (*)    Total Protein 6.4 (*)    Total Bilirubin 1.6 (*)    GFR calc non Af Amer 42 (*)    GFR calc Af Amer 49 (*)    All other components within normal limits  I-STAT CHEM 8, ED - Abnormal; Notable for the following components:   BUN 34 (*)  Creatinine, Ser 1.40 (*)    Calcium, Ion 0.88 (*)    TCO2 33 (*)    Hemoglobin 11.2 (*)    HCT 33.0 (*)    All other components within normal limits  TROPONIN I (HIGH SENSITIVITY) - Abnormal; Notable for the following components:   Troponin I (High Sensitivity) 19 (*)    All other components within normal limits  SARS CORONAVIRUS 2  BRAIN NATRIURETIC PEPTIDE  URINALYSIS, ROUTINE W REFLEX MICROSCOPIC  TROPONIN I (HIGH SENSITIVITY)    EKG EKG Interpretation  Date/Time:  Monday April 28 2019 10:41:01 EDT Ventricular Rate:  64 PR Interval:    QRS Duration: 96 QT Interval:  476 QTC Calculation: 492 R Axis:   -75 Text Interpretation:  Sinus rhythm Left anterior fascicular block Borderline T abnormalities,  inferior leads Borderline ST elevation, anterior leads Borderline prolonged QT interval Confirmed by Quintella Reichert [phone]) on 04/28/2019 10:43:14 AM   Radiology Dg Chest Port 1 View  Result Date: 04/28/2019 CLINICAL DATA:  Shortness of breath EXAM: PORTABLE CHEST 1 VIEW COMPARISON:  April 15, 2019 FINDINGS: There is no edema or consolidation. Heart size and pulmonary vascularity are normal. No adenopathy. Patient is status post aortic valve replacement. There is aortic atherosclerosis. There is a total shoulder replacement on right with apparent anterior dislocation on right, stable. IMPRESSION: No edema or consolidation. Heart size normal. Status post aortic valve replacement. Chronic anterior dislocation right shoulder with postoperative change in this area. Aortic Atherosclerosis (ICD10-I70.0). Electronically Signed   By: Lowella Grip III M.D.   On: 04/28/2019 11:17    Procedures Procedures (including critical care time)  Medications Ordered in ED Medications  acetaminophen (TYLENOL) tablet 650 mg (650 mg Oral Given 04/28/19 1221)     Initial Impression / Assessment and Plan / ED Course  I have reviewed the triage vital signs and the nursing notes.  Pertinent labs & imaging results that were available during my care of the patient were reviewed by me and considered in my medical decision making (see chart for details).       Patient with an extensive past medical history including status post [place] 2 weeks ago presents to the ED with generalized weakness.  Seen for his follow-up appointment a week ago, had signs and symptoms of volume overloaded according to cardiology PA, his diuretics were then adjusted.  Today he endorses generalized weakness, decrease in appetite, has not been eating and states he feels very dizzy along with weak while ambulating, states he has to sit down to not fall.  According to daughter patient has lost 10 pounds in about a week he is currently down to 88  pounds.  Does voice some right shoulder pain, states he has try some medication at home without improvement in symptoms. Cardiology was called for consultation, they reported patient will likely need admission through hospitalist service they are agreeable to consultation.  Patient also endorses some dysphagia, reports is been more difficult for him to swallow which has decreased his intake.  An i-STAT Chem-8 was ordered which show a creatinine level with a AKI of 1.4, hemoglobin slightly decreased, no other electrolyte derangement.  CBC showed no leukocytosis, globin is 9.4, consistent with his previous visits.  A BNP was added to further evaluate patient although x-ray did not show any edema, consolidation, there is no swelling to his legs or bilateral pitting edema.  Patient does appear extremely cachectic, is currently weighing 39.9 kg, I have spoken to [facility] of cardiology who  recommended patient will also need admission via hospitalist service.  Will now place call for hospitalist admission.  A COVID-19 swab has been ordered, he was swabbed 2 weeks ago with a negative result.Vitals are within normal limits, no tachycardia, no hypoxia or hypertension.   12:45 PM Dr. Ralene Bathe has spoken to hospitalist who would like cardiology re consultation on note for dysphagia. Will place consult. I have discussed case with Dr. Ralene Bathe who has evaluated patient and agrees with management.    Portions of this note were generated with [employer]. Dictation errors may occur despite best attempts at proofreading.  Final Clinical Impressions(s) / ED Diagnoses   Final diagnoses:  Weakness  Adult failure to thrive    ED Discharge Orders    None       Janeece Fitting, Hershal Coria 04/28/19 1248    Quintella Reichert, MD 04/29/19 [phone]

## 2019-04-28 NOTE — Progress Notes (Signed)
1759: Patient arrived to room 5W12. Ambulated from stretcher to bed. Assessment completed. Patient noted to have swelling and bruising to right ankle/foot/leg. Some difficulty noted with swallowing fluids, though patient did not cough. Informed to advise nurse if he has difficulties. Daughter Pattie at bedside. Wife removed from contacts per patient and daughter request due to her dementia. Bed in low locked position, bed alarm on, call light in reach. Patient oriented to floor.

## 2019-04-28 NOTE — Telephone Encounter (Signed)
  [facility]   MULTIDISCIPLINARY HEART VALVE TEAM   Last week I saw patient in the office for 1 week follow up after TAVR. He did not look well and has s/s volume overload and diuretics were adjusted. Repeat labs showed stable blood counts. I asked the patient to follow up with me today.   Patient's daughter, Kathee Delton, called in to report him not doing very well. He has had progressive weakness and failure to thrive. He has new dysphagia and has not been eating, weigh down to 88 lbs. He has back pain under his shoulder that has not been relieved by home pain medication. I have asked him to be seen and they are planning to come to [facility] ED.   Angelena Form PA-C  [facility]

## 2019-04-28 NOTE — Progress Notes (Signed)
  [place] VALVE TEAM   Patient admitted to hospitalist service for evaluation of back pain, weakness, dysphagia and failure to thrive with progressive weight loss. He underwent TAVR 04/15/19. Structural heart asked to comment on whether or not this could be related to his recent surgery. I do not think any of this is related to TAVR procedure, but we will formally evaluate tomorrow when Dr. Burt Knack is back in the hospital.  Angelena Form PA-C  [facility]  [phone]

## 2019-04-28 NOTE — Plan of Care (Signed)
  Problem: Pain Managment: Goal: General experience of comfort will improve Outcome: Not Progressing  C/O pain throughout. Medication given as ordered.

## 2019-04-28 NOTE — ED Triage Notes (Signed)
C/o weakness increasing since heart valve replacement 2 weeks ago, pt has a dusky color-- states he is losing weight (10-12# in a week)

## 2019-04-29 DIAGNOSIS — I5032 Chronic diastolic (congestive) heart failure: Secondary | ICD-10-CM

## 2019-04-29 DIAGNOSIS — R1310 Dysphagia, unspecified: Secondary | ICD-10-CM

## 2019-04-29 DIAGNOSIS — Z953 Presence of xenogenic heart valve: Secondary | ICD-10-CM

## 2019-04-29 DIAGNOSIS — N179 Acute kidney failure, unspecified: Secondary | ICD-10-CM

## 2019-04-29 DIAGNOSIS — I350 Nonrheumatic aortic (valve) stenosis: Secondary | ICD-10-CM

## 2019-04-29 LAB — BASIC METABOLIC PANEL
Anion gap: 11 (ref 5–15)
BUN: 26 mg/dL — ABNORMAL HIGH (ref 8–23)
CO2: 26 mmol/L (ref 22–32)
Calcium: 8 mg/dL — ABNORMAL LOW (ref 8.9–10.3)
Chloride: 101 mmol/L (ref 98–111)
Creatinine, Ser: 1.22 mg/dL (ref 0.61–1.24)
GFR calc Af Amer: >60 mL/min (ref >60)
GFR calc non Af Amer: 55 mL/min — ABNORMAL LOW (ref >60)
Glucose, Bld: 72 mg/dL (ref 70–99)
Potassium: 3.4 mmol/L — ABNORMAL LOW (ref 3.5–5.1)
Sodium: 138 mmol/L (ref 135–145)

## 2019-04-29 LAB — FERRITIN: Ferritin: 1771 ng/mL — ABNORMAL HIGH (ref 24–336)

## 2019-04-29 LAB — CBC
HCT: 23.6 % — ABNORMAL LOW (ref 39.0–52.0)
Hemoglobin: 7.6 g/dL — ABNORMAL LOW (ref 13.0–17.0)
MCH: 24.7 pg — ABNORMAL LOW (ref 26.0–34.0)
MCHC: 32.2 g/dL (ref 30.0–36.0)
MCV: 76.6 fL — ABNORMAL LOW (ref 80.0–100.0)
Platelets: 72 10*3/uL — ABNORMAL LOW (ref 150–400)
RBC: 3.08 MIL/uL — ABNORMAL LOW (ref 4.22–5.81)
RDW: 23 % — ABNORMAL HIGH (ref 11.5–15.5)
WBC: 4.7 10*3/uL (ref 4.0–10.5)
nRBC: 0 % (ref 0.0–0.2)

## 2019-04-29 LAB — TYPE AND SCREEN
ABO/RH(D): A POS
Antibody Screen: NEGATIVE

## 2019-04-29 LAB — CBC WITH DIFFERENTIAL/PLATELET
Abs Immature Granulocytes: 0.05 10*3/uL (ref 0.00–0.07)
Basophils Absolute: 0 10*3/uL (ref 0.0–0.1)
Basophils Relative: 1 %
Eosinophils Absolute: 0.3 10*3/uL (ref 0.0–0.5)
Eosinophils Relative: 6 %
HCT: 26.7 % — ABNORMAL LOW (ref 39.0–52.0)
Hemoglobin: 8.4 g/dL — ABNORMAL LOW (ref 13.0–17.0)
Immature Granulocytes: 1 %
Lymphocytes Relative: 4 %
Lymphs Abs: 0.2 10*3/uL — ABNORMAL LOW (ref 0.7–4.0)
MCH: 24.6 pg — ABNORMAL LOW (ref 26.0–34.0)
MCHC: 31.5 g/dL (ref 30.0–36.0)
MCV: 78.1 fL — ABNORMAL LOW (ref 80.0–100.0)
Monocytes Absolute: 0.4 10*3/uL (ref 0.1–1.0)
Monocytes Relative: 7 %
Neutro Abs: 4 10*3/uL (ref 1.7–7.7)
Neutrophils Relative %: 81 %
Platelets: 78 10*3/uL — ABNORMAL LOW (ref 150–400)
RBC: 3.42 MIL/uL — ABNORMAL LOW (ref 4.22–5.81)
RDW: 22.8 % — ABNORMAL HIGH (ref 11.5–15.5)
WBC: 4.9 10*3/uL (ref 4.0–10.5)
nRBC: 0 % (ref 0.0–0.2)

## 2019-04-29 LAB — VITAMIN B12: Vitamin B-12: 3284 pg/mL — ABNORMAL HIGH (ref 180–914)

## 2019-04-29 LAB — IRON AND TIBC
Iron: 99 ug/dL (ref 45–182)
Saturation Ratios: 62 % — ABNORMAL HIGH (ref 17.9–39.5)
TIBC: 160 ug/dL — ABNORMAL LOW (ref 250–450)
UIBC: 61 ug/dL

## 2019-04-29 LAB — FOLATE: Folate: >100 ng/mL (ref >5.9)

## 2019-04-29 MED ORDER — SODIUM CHLORIDE 0.9 % IV SOLN
1.0000 g | Freq: Once | INTRAVENOUS | Status: AC
  Administered 2019-04-30: 1 g via INTRAVENOUS
  Filled 2019-04-29: qty 1000

## 2019-04-29 MED ORDER — ENSURE ENLIVE PO LIQD
237.0000 mL | Freq: Three times a day (TID) | ORAL | Status: DC
  Administered 2019-04-29 – 2019-05-02 (×7): 237 mL via ORAL

## 2019-04-29 MED ORDER — ADULT MULTIVITAMIN LIQUID CH
15.0000 mL | Freq: Every day | ORAL | Status: DC
  Administered 2019-04-29 – 2019-05-02 (×4): 15 mL via ORAL
  Filled 2019-04-29 (×4): qty 15

## 2019-04-29 NOTE — Consult Note (Addendum)
HE[facility] AND [place] VALVE TEAM  Cardiology Consultation:   Patient ID: Joshua Hudson MRN: [id number]; DOB: 06-20-1935  Admit date: 04/28/2019 Date of Consult: 04/29/2019  Primary Care Provider: Lavone Orn, MD Primary Cardiologist: [name], MD   Patient Profile:   Joshua Hudson is a 84 y.o. 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 EHorris Latino  History of Present Illness:   Patient underwent aortic valve replacement using a 25 mm Edwards magna stented bovine pericardial tissue valve by Dr. GServando Snarein 2009 for severe symptomatic aortic stenosis. His early postoperative recovery was notable for postoperative atrial fibrillation which has not recurred. He has done well from a cardiac standpoint and been followed carefully by Dr. TRadford Pax Echocardiogram performed in January of this year revealed normal left ventricular systolic function and mild aortic insufficiency.Over the past 2 to 3 months the patient has developed relatively acute onset of shortness of breath, chest discomfort, orthopnea, and lower extremity edema. Initially symptoms were primarily with physical exertion but symptoms have progressed fairly rapidly to shortness of breath with minimal activity and occasionally at rest.  He has had problems with his right shoulder and underwent right shoulder replacement more than 1 year ago. He has had multiple problems ever since with an attempt at revision of his shoulder arthroplasty which subsequently became infected, requiring removal of the hardware  and placement of temporary plastic hardware. He has been evaluated for redo shoulder replacement in the future.He was seen by MHavery Morosin the office on 03/18/2019 for preoperative clearance for redo shoulder arthroplasty. He was noted to have significant lower extremity edema as well as other signs and symptoms of acute heart failure.Follow-up TTE on 03/18/2019 revealed normal left ventricular systolic function with at least moderate AI. Subsequent TEE on 04/02/2019 confirmed the presence of severe prosthetic valve dysfunction with severe AI. There was no vegetation on the aortic valve she suggest endocarditis. Cardiac catheterization on 04/02/2019 showed normal coronaries. He was also noted to have worsening anemia (hg down to 7). Haptoglobin was low c/w hemolytic anemia.   He underwent successfulvalve in valveTAVR with a280mEdwards Sapien UltraTHV via the TF approach on 04/15/19. Post operative echo showed EF 60-65%, normally functioning TAVR with mean gradient of 14 mmHg and no AI. He was transfused 2 U PRBCs for hemolytic anemia felt to be related to his severe AI. He also developed post op thrombocytopenia with platelets down to 46K. He was discharged on POD1 on aspirin and plavix.  At post surgical follow up he was feeling poorly. He had continued shortness of breath and fatigue. He had not been sleeping well and still with significant LE edema. Follow up lab work showed stable hg ~9 and platelets improved to 74,000. Creat and electrolytes were okay but NT pro BNP was elevated ~1500. His diuretics were adjusted from Lasix 404maily to 78m46mD x 3 days.   The patient continued to do poorly with back/shoulder pain, weakness, dysphagia and failure to thrive with progressive weight loss (10 lbs over 1 week) and presented to the ER yesterday. In the ER his ECG showed sinus with  non specific ST/TW change, HR 64 bpm, CXR with no acute abnormality, AKI with creat up to 1.52, BNP 332, HS  troponin 19, covid 19 negative, UA unremarkable, hg 9.4 and plt 90K. He was admitted for further work up and monitoring. DG esophagus with non specific esophageal dysmotility disorder and GI has been consulted. Apparently, he has a history of dysphagia and met nearly all criteria for achalasia a year ago and was treated with TTS balloon dilation and botox injections by Dr. Therisa Doyne which were partially effective. Per patient, dysphagia has gotten progressively worse since his TAVR and now unable to eat/drink much of anything. Dr. Oletta Lamas with GI has seen him and plans to proceed with EGD and esophageal dilation.  The patient is currently feeling much better today than yesterday. He says his back pain is much better. His LE edema and right arm edema is much improved from last week after increased lasix although his dyspnea is really unchanged. He chronically sleeps in a recliner for comfort. No PND. No dizziness of syncope.    Heart Pathway Score:     Past Medical History:  Diagnosis Date   Aortic insufficiency 03/24/2019   AI of bioprosthetic AVR   Chronic diastolic CHF (congestive heart failure) ([place]) 03/24/2019   Colon polyps    s/p diverticular perforation requiring 2-stage repair   Diverticulosis    DJD (degenerative joint disease), lumbosacral    Esophageal stricture    GERD (gastroesophageal reflux disease)    Hemolytic anemia (HCC)    History of blood transfusion    patient states "years ago"   History of kidney stones    passed stones   Hypertension    Mitral regurgitation    moderate   Occipital neuralgia    Postoperative atrial fibrillation ([place]) 05/10/2015   Prosthetic valve dysfunction    Rheumatoid arthritis (HCC)    s/o long term steroids  shoulders and hands   Rotator cuff arthropathy    right   S/P aortic valve replacement with bioprosthetic valve 01/16/2008   85m Edwards Magna perimount bovine pericardial tissue valve, model 3000   S/P valve-in-valve  TAVR 04/15/2019   26 mm Edwards Sapien 3 Ultra transcatheter heart valve placed via percutaneous right transfemoral approach    SBE (subacute bacterial endocarditis) prophylaxis candidate    for dental procedures   Severe aortic stenosis    S/P prosthetic valve replacement w 25 mm Edwards like science percardial tissue valve,Turner - 01/2008    Past Surgical History:  Procedure Laterality Date   APPENDECTOMY     BOTOX INJECTION N/A 01/22/2018   Procedure: BOTOX INJECTION;  Surgeon: KRonnette Juniper MD;  Location: [facility];  Service: Gastroenterology;  Laterality: N/A;   CARDIAC CATHETERIZATION     09   CARDIAC VALVE REPLACEMENT  01/2008   aortic valve replacement   CATARACT EXTRACTION W/ INTRAOCULAR LENS  IMPLANT, BILATERAL     COLON RESECTION     diverticulitis    dental implants     permanent   ESOPHAGEAL MANOMETRY N/A 11/07/2017   Procedure: ESOPHAGEAL MANOMETRY (EM);  Surgeon: KRonnette Juniper MD;  Location: [facility];  Service: Gastroenterology;  Laterality: N/A;   ESOPHAGOGASTRODUODENOSCOPY (EGD) WITH PROPOFOL N/A 01/22/2018   Procedure: ESOPHAGOGASTRODUODENOSCOPY (EGD) WITH PROPOFOL;  Surgeon: KRonnette Juniper MD;  Location: [facility];  Service: Gastroenterology;  Laterality: N/A;   EXCISIONAL TOTAL SHOULDER ARTHROPLASTY WITH ANTIBIOTIC SPACER Right 12/31/2018   Procedure: EXCISIONAL TOTAL SHOULDER ARTHROPLASTY WITH ANTIBIOTIC SPACER;  Surgeon: NNetta Cedars MD;  Location:  [facility];  Service: Orthopedics;  Laterality: Right;   HERNIA REPAIR     INTRAOPERATIVE TRANSTHORACIC ECHOCARDIOGRAM N/A 04/15/2019   Procedure: Intraoperative Transthoracic Echocardiogram;  Surgeon: Sherren Mocha, MD;  Location: [facility];  Service: Open Heart Surgery;  Laterality: N/A;   IRRIGATION AND DEBRIDEMENT SHOULDER Right 11/20/2018    IRRIGATION AND DEBRIDEMENT SHOULDER WITH POLY EXCHANGE (Right Shoulder)   IRRIGATION AND DEBRIDEMENT SHOULDER Right 11/20/2018   Procedure: IRRIGATION AND  DEBRIDEMENT SHOULDER WITH POLY EXCHANGE;  Surgeon: Netta Cedars, MD;  Location: [place];  Service: Orthopedics;  Laterality: Right;   LUMBAR LAMINECTOMY     x 2   REVERSE SHOULDER ARTHROPLASTY Right 03/01/2018   Procedure: RIGHT REVERSE SHOULDER ARTHROPLASTY;  Surgeon: Netta Cedars, MD;  Location: [place];  Service: Orthopedics;  Laterality: Right;   RIGHT/LEFT HEART CATH AND CORONARY ANGIOGRAPHY N/A 04/02/2019   Procedure: RIGHT/LEFT HEART CATH AND CORONARY ANGIOGRAPHY;  Surgeon: Leonie Man, MD;  Location: [place] [facility];  Service: Cardiovascular;  Laterality: N/A;   SAVORY DILATION N/A 01/22/2018   Procedure: SAVORY DILATION;  Surgeon: Ronnette Juniper, MD;  Location: [facility];  Service: Gastroenterology;  Laterality: N/A;   SHOULDER HEMI-ARTHROPLASTY Right 06/14/2018   Procedure: RIGHT  REVERSE TOTAL SHOULDER OPEN POLY EXCHANGE;  Surgeon: Netta Cedars, MD;  Location: [place];  Service: Orthopedics;  Laterality: Right;   TEE WITHOUT CARDIOVERSION N/A 01/29/2014   Procedure: TRANSESOPHAGEAL ECHOCARDIOGRAM (TEE);  Surgeon: Sueanne Margarita, MD;  Location[facility] ENDOSCOPY;  Service: Cardiovascular;  Laterality: N/A;   TEE WITHOUT CARDIOVERSION N/A 04/02/2019   Procedure: TRANSESOPHAGEAL ECHOCARDIOGRAM (TEE);  Surgeon: Josue Hector, MD;  Location: [facility] ENDOSCOPY;  Service: Cardiovascular;  Laterality: N/A;   TONSILLECTOMY     TRANSCATHETER AORTIC VALVE REPLACEMENT, TRANSFEMORAL  04/15/2019   TRANSCATHETER AORTIC VALVE REPLACEMENT, TRANSFEMORAL N/A 04/15/2019   Procedure: TRANSCATHETER AORTIC VALVE REPLACEMENT, TRANSFEMORAL with POST BALLOON DILATION;  Surgeon: Sherren Mocha, MD;  Location: [place];  Service: Open Heart Surgery;  Laterality: N/A;     Home Medications:  Prior to Admission medications   Medication Sig Start Date End Date Taking? Authorizing Provider  acetaminophen (TYLENOL) 500 MG tablet Take 1,000 mg by mouth every 6 (six) hours as needed for moderate pain or headache.    Yes [provider]  aspirin EC 81 MG EC tablet Take 1 tablet (81 mg total) by mouth daily. 01/02/14  Yes Turner, Eber Hong, MD  cholecalciferol (VITAMIN D3) 25 MCG (1000 UT) tablet Take 1,000 Units by mouth daily.   Yes [provider]  clopidogrel (PLAVIX) 75 MG tablet Take 1 tablet (75 mg total) by mouth daily with breakfast. 04/17/19  Yes Eileen Stanford, PA-C  folic acid (FOLVITE) 1 MG tablet Take 1 mg by mouth daily.   Yes [provider]  furosemide (LASIX) 40 MG tablet Take 40 mg daily. 04/16/19  Yes Eileen Stanford, PA-C  leflunomide (ARAVA) 20 MG tablet Take 20 mg by mouth daily.   Yes [provider]  MEGARED OMEGA-3 KRILL OIL PO Take 750 mg by mouth daily.    Yes [provider]  Melatonin 3 MG TABS Take 1 tablet by mouth at bedtime.   Yes [provider]  methotrexate (RHEUMATREX) 2.5 MG tablet Take 15 mg by mouth every Monday. Caution:Chemotherapy. Protect from light.   Yes [provider]  metoprolol succinate (TOPROL-XL) 25 MG 24 hr tablet Take 1 tablet by mouth once daily Patient taking differently: Take 25 mg by mouth daily.  03/17/19  Yes Turner, Eber Hong, MD  Multiple Vitamin (MULTIVITAMIN WITH MINERALS) TABS tablet Take 1 tablet by mouth daily.   Yes [provider]  pantoprazole (PROTONIX) 40 MG tablet Take 40 mg by mouth daily before breakfast.    Yes [provider]  potassium chloride SA (K-DUR) 20 MEQ tablet Take 20 meq daily 04/16/19  Yes Eileen Stanford, PA-C  predniSONE (DELTASONE) 5 MG tablet Take 5 mg by mouth daily with breakfast.    Yes [provider]  tamsulosin (FLOMAX) 0.4 MG CAPS capsule Take 0.8 mg by mouth daily.   Yes [provider]  THERATEARS 0.25 % SOLN Place 1 drop into both eyes 3 (three) times daily as needed (dry/irritated eyes.).   Yes [provider]  Turmeric 500 MG CAPS Take 500 mg by mouth daily.   Yes [provider]  vitamin  B-12 (CYANOCOBALAMIN) 1000 MCG tablet Take 1,000 mcg by mouth daily.   Yes [provider]    Inpatient Medications: Scheduled Meds:  aspirin EC  81 mg Oral Daily   clopidogrel  75 mg Oral Q breakfast   enoxaparin (LOVENOX) injection  30 mg Subcutaneous Q24H   feeding supplement (ENSURE ENLIVE)  237 mL Oral BID BM   folic acid  1 mg Oral Daily   leflunomide  20 mg Oral Daily   Melatonin  1 tablet Oral QHS   metoprolol succinate  25 mg Oral Daily   pantoprazole  40 mg Oral QAC breakfast   pneumococcal 23 valent vaccine  0.5 mL Intramuscular Tomorrow-1000   predniSONE  5 mg Oral Q breakfast   tamsulosin  0.8 mg Oral Daily   vitamin B-12  1,000 mcg Oral Daily   Continuous Infusions:  sodium chloride 75 mL/hr at 04/28/19 2305   [START ON 04/30/2019] ampicillin (OMNIPEN) IV     PRN Meds: acetaminophen-codeine, polyvinyl alcohol  Allergies:   No Known Allergies  Social History:   Social History   Socioeconomic History   Marital status: Married    Spouse name: Not on file   Number of children: 2   Years of education: Not on file   Highest education level: Not on file  Occupational History   Occupation: Retired Market researcher at [employer] strain: Not on file   Food insecurity    Worry: Not on file    Inability: Not on file   Transportation needs    Medical: Not on file    Non-medical: Not on file  Tobacco Use   Smoking status: Former Smoker    Packs/day: 0.50    Years: 10.00    Pack years: 5.00    Types: Cigarettes    Start date: 01/16/1974    Quit date: 09/05/1983    Years since quitting: 35.6   Smokeless tobacco: Former Systems developer    Types: Chew  Substance and Sexual Activity   Alcohol use: Not Currently    Alcohol/week: 0.0 standard drinks   Drug use: No   Sexual activity: Not on file  Lifestyle   Physical activity    Days per week: Not on file    Minutes per session: Not on file   Stress: Not on file    Relationships   Social connections    Talks on phone: Not on file    Gets together: Not on file    Attends religious service: Not on file    Active member of club or organization: Not on file  Attends meetings of clubs or organizations: Not on file    Relationship status: Not on file   Intimate partner violence    Fear of current or ex partner: Not on file    Emotionally abused: Not on file    Physically abused: Not on file    Forced sexual activity: Not on file  Other Topics Concern   Not on file  Social History Narrative   Not on file    Family History:    Family History  Problem Relation Age of Onset   Other Mother        NO HEALTH PROBLEMS   Heart disease Father    Arthritis Father    Heart Problems Sister        RELATED TO A MVA   Suicidality Brother    Other Sister        3 SISTERS IN GOOD HEALTH   Other Brother        2 [place] Section Three   Other Daughter        2 IN GOOD HEALTH     ROS:  Please see the history of present illness.  All other ROS reviewed and negative.     Physical Exam/Data:   Vitals:   04/28/19 1715 04/28/19 1759 04/28/19 2316 04/29/19 0704  BP: 126/71 127/80 103/60 101/63  Pulse: 65 71 64 61  Resp: '15 17 20 14  ' Temp:  97.8 F (36.6 C) 98 F (36.7 C) 97.8 F (36.6 C)  TempSrc:  Oral  Oral  SpO2: 100% 100% 99% 100%  Weight:      Height:        Intake/Output Summary (Last 24 hours) at 04/29/2019 1002 Last data filed at 04/29/2019 0303 Gross per 24 hour  Intake 281.29 ml  Output 270 ml  Net 11.29 ml   Last 3 Weights 04/28/2019 04/23/2019 04/16/2019  Weight (lbs) 88 lb 97 lb 12.8 oz 104 lb 4.8 oz  Weight (kg) 39.917 kg 44.362 kg 47.31 kg     Body mass index is 15.59 kg/m.  General:  Elderly and frail appearing HEENT: normal Lymph: no adenopathy Neck: no JVD Endocrine:  No thryomegaly Vascular: No carotid bruits;  Cardiac:  normal S1, S2; RRR; harsh heart sounds but no murmur Lungs:  clear to  auscultation bilaterally, no wheezing, rhonchi or rales  Abd: soft, nontender, no hepatomegaly  Ext: trace bilateral LE edema ( much improved from last week) Musculoskeletal:  No deformities Skin: warm and dry  Neuro:  CNs 2-12 intact, no focal abnormalities noted Psych:  Normal affect   EKG:  The EKG was personally reviewed and demonstrates: sinus with non specific ST/TW changes, LAFB. HR 64 Telemetry:  Telemetry was personally reviewed and demonstrates:  sinus  Relevant CV Studies: TAVR OPERATIVE NOTE   Date of Procedure:04/15/2019  Preoperative Diagnosis:  S/P Aortic Valve Replacement using Stented Bovine Pericardial Tissue Valve  Prosthetic Valve Dysfunction  Severe Aortic Insufficiency  Hemolytic Anemia  Postoperative Diagnosis:Same   Procedure:   Valve-in-ValveTranscatheter Aortic Valve Replacement - PercutaneousRightTransfemoral Approach Edwards Sapien 3 UltraTHV (size 72m, model # 9750TFX, serial #[id number]  Co-Surgeons:Clarence H. ORoxy Manns MD and MSherren Mocha MD  Anesthesiologist:John GLissa Hoard MD  Echocardiographer:Peter NJohnsie Cancel MD  Pre-operative Echo Findings: ? Severeprosthetic valveaorticinsufficiency ? Normalleft ventricular systolic function  Post-operative Echo Findings: ? Noparavalvular leak ? Unchangedleft ventricular systolic function   _____________   Echo 04/16/19: IMPRESSIONS 1. The left ventricle has normal systolic function with an ejection fraction of 60-65%. The  cavity size was normal. Left ventricular diastolic Doppler parameters are indeterminate. 2. The right ventricle has normal systolic function. The cavity was normal. 3. Left atrial size was mildly dilated. 4. The mitral valve is abnormal. Mild thickening of the mitral valve leaflet. There is mild mitral annular calcification present. 5.  The tricuspid valve is grossly normal. 6. The aorta is normal in size and structure. 7. The interatrial septum is aneurysmal. 8. Normal LV systolic function; s/p TAVR with mean gradient of 14 mmHg and no AI; mild MR; mild LAE; mild TR.  Laboratory Data:  High Sensitivity Troponin:   Recent Labs  Lab 04/28/19 1052 04/28/19 1224  TROPONINIHS 19* 19*     Chemistry Recent Labs  Lab 04/23/19 1432 04/28/19 1052 04/28/19 1108 04/29/19 0309  NA 139 139 137 138  K 4.3 4.0 4.0 3.4*  CL 97 95* 98 101  CO2 30* 30  --  26  GLUCOSE 114* 104* 98 72  BUN 24 28* 34* 26*  CREATININE 1.15 1.52* 1.40* 1.22  CALCIUM 9.9 9.7  --  8.0*  GFRNONAA 59* 42*  --  55*  GFRAA 68 49*  --  >60  ANIONGAP  --  14  --  11    Recent Labs  Lab 04/28/19 1052  PROT 6.4*  ALBUMIN 3.8  AST 38  ALT 19  ALKPHOS 79  BILITOT 1.6*   Hematology Recent Labs  Lab 04/23/19 1432 04/28/19 1052 04/28/19 1108 04/29/19 0309  WBC 5.2 5.9  --  4.7  RBC 3.65* 3.83*  --  3.08*  HGB 9.1* 9.4* 11.2* 7.6*  HCT 29.3* 29.6* 33.0* 23.6*  MCV 80 77.3*  --  76.6*  MCH 24.9* 24.5*  --  24.7*  MCHC 31.1* 31.8  --  32.2  RDW 22.3* 23.3*  --  23.0*  PLT 74* 90*  --  72*   BNP Recent Labs  Lab 04/23/19 1432 04/28/19 1052  BNP  --  332.0*  PROBNP 1,582*  --     DDimer No results for input(s): DDIMER in the last 168 hours.   Radiology/Studies:  Dg Chest Port 1 View  Result Date: 04/28/2019 CLINICAL DATA:  Shortness of breath EXAM: PORTABLE CHEST 1 VIEW COMPARISON:  April 15, 2019 FINDINGS: There is no edema or consolidation. Heart size and pulmonary vascularity are normal. No adenopathy. Patient is status post aortic valve replacement. There is aortic atherosclerosis. There is a total shoulder replacement on right with apparent anterior dislocation on right, stable. IMPRESSION: No edema or consolidation. Heart size normal. Status post aortic valve replacement. Chronic anterior dislocation right shoulder with  postoperative change in this area. Aortic Atherosclerosis (ICD10-I70.0). Electronically Signed   By: Lowella Grip III M.D.   On: 04/28/2019 11:17   Dg Esophagus W Single Cm (sol Or Thin Ba)  Result Date: 04/28/2019 CLINICAL DATA:  Weakness, failure to thrive, weight loss. Difficulty drinking and eating for the last 2 days. Persistent throat clearing. EXAM: ESOPHOGRAM/BARIUM SWALLOW TECHNIQUE: Single contrast examination was performed using  thin barium. FLUOROSCOPY TIME:  Fluoroscopy Time:  2 minutes, 0 seconds Radiation Exposure Index (if provided by the fluoroscopic device): 11.7 mGy Number of Acquired Spot Images: 0 COMPARISON:  Chest CT from 04/07/2019 and 03/10/2019 FINDINGS: Because of the patient's frailty and clinical situation, the entire exam was performed using thin barium with the patient in the LPO position. Various observations include aortic stent and dislocated right glenohumeral joint with prosthetic right humerus. There is disruption of primary peristaltic  waves in the upper esophagus on various swallows compatible with esophageal dysmotility. Slight prominence of the esophageal impression on the esophagus but without a discrete mass in this vicinity. Because of the dysmotility, there is prominence of the upper thoracic esophagus and the patient's throat clearing suggests that this dysmotility may be causing backing up of secretions and barium prompting Mr. Botero to repeatedly clear his throat. Due to secondary and tertiary contractions as well as the underlying dysmotility, it is difficult to exclude narrowing of the distal most esophagus for example on image 10/8, because contrast with slowly percolated from the esophagus into the stomach instead of briskly passing on 3 peristalsis. Accordingly, I cannot completely exclude a smooth stricture of the distal esophagus. The most I was able to distend this up to was about 5 mm (image 10/8). I considered administering a barium pill, but given  the dysmotility I am very skeptical that the barium pill would successfully make its way down to the distal esophagus due challenge the region in a realistic timeframe. IMPRESSION: 1. Nonspecific esophageal dysmotility disorder. 2. Maximum distention of the distal most esophagus was about 5 mm. This may well simply be due to dysmotility but I cannot exclude a smooth stricture. 3. 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 4. The patient repeatedly cleared his throat before, during, and after the examination, suggesting possible backing up of secretions into the pharynx/hypopharynx region. This may well be secondary to the dysmotility. I did not observe a definite high-grade obstruction to fluids, but rather generalized poor transit due to dysmotility. Electronically Signed   By: Van Clines M.D.   On: 04/28/2019 16:28    Assessment and Plan:   Prosthetic valve dysfunction s/p valve in valve TAVR:doing well from a cardiac standpoint. Continue aspirin and plavix.  Chronic diastolic CHF: patient's volume status is dramatically improved after outpatient diuresis. Resume lasix 85m daily when creat stabilized   AKI: (creat 1.15--> 1.52). Not surprising with recent increase in diuretics and poor PO intake. Creat improved with hydration.  Hemolytic anemia: he was transfused 2U PRBCs during admission for TAVR. Hg 11.2 yesterday but now down to 7.6 today. ? Hemodilution with IVFs for AKI. Continue to follow. GI following.   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 Apparently, he has a history of dysphagia and met nearly all criteria for achalasia a year ago and was treated with TTS balloon dilation and botox injections by Dr. KTherisa Doynewhich were partially effective. Per patient, dysphagia has gotten progressively worse since his TAVR and now unable to eat/drink much of  anything. Dr. EOletta Lamaswith GI has seen him and plans to proceed with EGD and esophageal dilation.  For questions or updates, please contact [place] consult [email or web address] for contact info under   Patient seen, examined. Available data reviewed. Agree with findings, assessment, and plan as outlined by KNell Range PA-C. The patient is independently interviewed and examined. He has lost more weight and clearly has protein-calorie malnutrition. Reports minimal PO intake that he attributes to dysphagia. Plans noted to perform EGD for further diagnosis and treatment tomorrow per Dr EOletta Lamas From a cardiac perspective, he appears stable. His post-op echo shows normal function of the TAVR bioprosthesis with mean gradient 14 mmHg and no paravalvular regurgitation. Unusual that he had such a significant drop in HgB over the last 24 hours without obvious bleeding. He does have a hx  of hemolysis which should be resolved after TAVR if related to his severe bioprosthetic valve dysfunction. Will add a haptoglobin and LDH to am labs tomorrow. Otherwise defer tx to primary team. If he requires short term interruption of plavix for further procedures, this would be acceptable.   Sherren Mocha, M.D. 04/29/2019 5:58 PM     Signed, Angelena Form, PA-C  04/29/2019 10:02 AM

## 2019-04-29 NOTE — Progress Notes (Signed)
Initial Nutrition Assessment  DOCUMENTATION CODES:   Severe malnutrition in context of chronic illness  INTERVENTION:  -Boost Breeze po TID, each supplement provides 250 kcal and 9 grams of protein -Liquid MVI  -Advance diet as tolerated Monitor magnesium, potassium, and phosphorus daily for at least 3 days, MD to replete as needed, as pt is at risk for refeeding syndrome given pt reported ongoing poor oral intake   NUTRITION DIAGNOSIS:   Severe Malnutrition related to chronic illness, altered GI function(esophageal stricture; possible achalasia) as evidenced by energy intake < or equal to 75% for > or equal to 1 month, percent weight loss, per patient/family report.   GOAL:   Patient will meet greater than or equal to 90% of their needs, Weight gain   MONITOR:   PO intake, Weight trends, Diet advancement, Supplement acceptance, Labs, I & O's  REASON FOR ASSESSMENT:   Consult Assessment of nutrition requirement/status  ASSESSMENT:  RD working remotely.  84 year old male with past medical history significant for esophageal stricture s/p dilation in May 2019, severe AS s/p TAVR 2 weeks ago, MR, RA on prednisone and MTX who presents with progressive weakness and fatigue with poor oral intake over the past 2 weeks secondary to swallowing difficulties  Patient had esophageal manometry in March 2019 which showed elevated LES resting and relaxation pressure; no diagnosis of achalasia was made due to intact peristalsis on majority of swallows. GI on board; tentative EGD and esophageal dilation on 8/26  Unable to reach patient via phone today. Per chart review; pt reports 20-30 lb wt loss over the past 3-4 months; no solid food for some time other than 1-2 bites of a sandwich and living off clear liquids. GI reports patient met all the criteria for achalasia a year ago other than complete aperistalsis.  Currently on CL diet; no recorded meals at this time  Current wt 39.9 kg (87.8  lb)  non-pitting; BLE - documented today Wt history reviewed - noted 21.1 lb wt loss in the past month (19.4%; severe for time frame)  Patient meets criteria for severe malnutrition in the context of chronic disease given severe wt loss and < 75% intake of energy requirements for > 1 month. Full assessment to follow  RD to order boost breeze while on CL diet; pt NPO at midnight for surgery. Will continue to monitor for diet advancement s/p surgery; provide continued ONS at that time. Patient is concerning for refeeding considering ongoing poor oral intake; recommend lab monitoring of magnesium, potassium, and phosphorus as diet progresses.   NUTRITION - FOCUSED PHYSICAL EXAM: Unable to complete at this time   Diet Order:   Diet Order            Diet NPO time specified  Diet effective midnight        Diet clear liquid Room service appropriate? Yes; Fluid consistency: Thin  Diet effective now              EDUCATION NEEDS:   No education needs have been identified at this time  Skin:  Skin Assessment: Reviewed RN Assessment  Last BM:  PTA  Height:   Ht Readings from Last 1 Encounters:  04/28/19 '5\' 3"'$  (1.6 m)    Weight:   Wt Readings from Last 1 Encounters:  04/28/19 39.9 kg    Ideal Body Weight:  56.4 kg  BMI:  Body mass index is 15.59 kg/m.  Estimated Nutritional Needs:   Kcal:  1400-1600 (35-40kcal/kg)  Protein:  70-80  Fluid:  >1.4L   Lajuan Lines, RD, [place] [phone] After Hours/Weekend Pager: [phone]

## 2019-04-29 NOTE — Progress Notes (Signed)
Daughter at bedside and asking what procedure would be done. Explained procedure per Dr. Oletta Lamas note to patient/daughter with verbalization of understanding.

## 2019-04-29 NOTE — Evaluation (Signed)
Physical Therapy Evaluation Patient Details Name: Joshua Hudson MRN: [id number] DOB: Feb 28, 1935 Today's Date: 04/29/2019   History of Present Illness  Pt is an 84 y/o male admitted secondary to esophageal stricture vs achalasia. Per notes, likely for dilation on 8/26. Pt with Chronic R shoulder dislocation at baseline. PMH includes HTN, RA, and s/p AVR.   Clinical Impression  Pt admitted secondary to problem above with deficits below. Pt with mild unsteadiness and weakness. Required min guard A for mobility tasks. Pt reports he lives with his wife, however, wife is not able to physically assist secondary to medical issues. Feel pt would benefit from PT follow up for strengthening and higher level balance. Will continue to follow acutely to maximize functional mobility independence and safety.     Follow Up Recommendations Home health PT;Supervision for mobility/OOB    Equipment Recommendations  None recommended by PT    Recommendations for Other Services       Precautions / Restrictions Precautions Precautions: Fall Restrictions Weight Bearing Restrictions: No      Mobility  Bed Mobility               General bed mobility comments: In chair upon entry  Transfers Overall transfer level: Needs assistance Equipment used: None Transfers: Sit to/from Stand Sit to Stand: Min guard         General transfer comment: Min guard for steadying assist.   Ambulation/Gait Ambulation/Gait assistance: Min guard Gait Distance (Feet): 300 Feet Assistive device: None Gait Pattern/deviations: Step-through pattern;Decreased stride length;Drifts right/left Gait velocity: Decreased   General Gait Details: Slow, mildly unsteady gait. Min guard for steadying assist throughout.   Stairs            Wheelchair Mobility    Modified Rankin (Stroke Patients Only)       Balance Overall balance assessment: Mild deficits observed, not formally tested                                            Pertinent Vitals/Pain Pain Assessment: No/denies pain    Home Living Family/patient expects to be discharged to:: Private residence Living Arrangements: Spouse/significant other Available Help at Discharge: Family Type of Home: House Home Access: Stairs to enter Entrance Stairs-Rails: Right Entrance Stairs-Number of Steps: 3 Home Layout: One level Home Equipment: Environmental consultant - 2 wheels      Prior Function Level of Independence: Independent               Hand Dominance        Extremity/Trunk Assessment   Upper Extremity Assessment Upper Extremity Assessment: Generalized weakness    Lower Extremity Assessment Lower Extremity Assessment: Generalized weakness    Cervical / Trunk Assessment Cervical / Trunk Assessment: Kyphotic  Communication   Communication: No difficulties  Cognition Arousal/Alertness: Awake/alert Behavior During Therapy: WFL for tasks assessed/performed Overall Cognitive Status: Within Functional Limits for tasks assessed                                        General Comments      Exercises     Assessment/Plan    PT Assessment Patient needs continued PT services  PT Problem List Decreased strength;Decreased balance;Decreased mobility       PT Treatment Interventions DME instruction;Gait training;Stair training;Therapeutic exercise;Therapeutic  activities;Functional mobility training;Balance training;Patient/family education    PT Goals (Current goals can be found in the Care Plan section)  Acute Rehab PT Goals Patient Stated Goal: to be able to eat PT Goal Formulation: With patient Time For Goal Achievement: 05/13/19 Potential to Achieve Goals: Good    Frequency Min 3X/week   Barriers to discharge        Co-evaluation               AM-PAC PT "6 Clicks" Mobility  Outcome Measure Help needed turning from your back to your side while in a flat bed without using bedrails?:  A Little Help needed moving from lying on your back to sitting on the side of a flat bed without using bedrails?: A Little Help needed moving to and from a bed to a chair (including a wheelchair)?: A Little Help needed standing up from a chair using your arms (e.g., wheelchair or bedside chair)?: A Little Help needed to walk in hospital room?: A Little Help needed climbing 3-5 steps with a railing? : A Little 6 Click Score: 18    End of Session Equipment Utilized During Treatment: Gait belt Activity Tolerance: Patient tolerated treatment well Patient left: in chair;with call bell/phone within reach;with chair alarm set Nurse Communication: Mobility status PT Visit Diagnosis: Unsteadiness on feet (R26.81);Muscle weakness (generalized) (M62.81)    Time: [id number] PT Time Calculation (min) (ACUTE ONLY): 15 min   Charges:   PT Evaluation $PT Eval Low Complexity: [place], PT, DPT  Acute Rehabilitation Services  Pager: [phone] Office: [phone]   Rudean Hitt 04/29/2019, 6:48 PM

## 2019-04-29 NOTE — H&P (View-Only) (Signed)
[facility] CONSULT Reason for consult: [facility] hospitalist.  PCP: Dr. Lavone Orn.  Primary GI: Dr. Therisa Doyne primary cardiologist: Dr. Radford Pax Referring Physician:   RENATO Hudson is an 84 y.o. male.  HPI: He has a very complicated history.  In 2009 he underwent AVR and did fairly well for some time until he developed severe AI.  This resulted in a TA VR by Dr. Burt Knack utilizing trans-femoral approach approximately 2 weeks ago.  The patient has had progressive weakness.  He is continued to be particularly weak ever since the surgery.  In addition to the trans-thoracic AVR, his other surgeries include appendectomy, colon resection due to perforated diverticulitis is a two-stage procedure.  He has had several procedures on his shoulder some type of back operation as well.  He has had previous colonoscopies with removal of colon polyps. He has had fairly persistent problems with dysphagia and difficulty swallowing that has been evaluated by Dr. Therisa Doyne.  He had a manometry last year showing increased LES pressure with increased residual pressure with dysmotility but some peristaltic waves.  Because of this he did not meet all the criteria for achalasia but this was interpreted as very suspicious for early achalasia.  He had TTS balloon dilatation 5/19 with Botox injection at the LES by Dr Therisa Doyne.  The patient notes that this really did not help him for very long.  He said it may have helped a small amount.  He states it really did not help much and things have gotten progressively worse since his aortic valve replacement several weeks ago.  He states that he has lost approximately 25 to 30 pounds over the past 3 to 4 months.  He was hoping the aortic valve replacement would improve things but if anything he has been worse.  He has not had any solid food for some time other than 1 or 2 bites of a sandwich and has been living off of clear liquids.  He regurgitates up a fair amount of this.  He states is his  idea of a meal this 1 can of clear liquid with possibly a bite of a sandwich.  Some days he does not even have that.  He has had progressive fatigue and has been so weak he needed help getting to the bathroom.  Past Medical History:  Diagnosis Date  . Aortic insufficiency 03/24/2019   AI of bioprosthetic AVR  . Chronic diastolic CHF (congestive heart failure) ([place]) 03/24/2019  . Colon polyps    s/p diverticular perforation requiring 2-stage repair  . Diverticulosis   . DJD (degenerative joint disease), lumbosacral   . Esophageal stricture   . GERD (gastroesophageal reflux disease)   . Hemolytic anemia ([place])   . History of blood transfusion    patient states "years ago"  . History of kidney stones    passed stones  . Hypertension   . Mitral regurgitation    moderate  . Occipital neuralgia   . Postoperative atrial fibrillation ([place]) 05/10/2015  . Prosthetic valve dysfunction   . Rheumatoid arthritis ([place])    s/o long term steroids  shoulders and hands  . Rotator cuff arthropathy    right  . S/P aortic valve replacement with bioprosthetic valve 01/16/2008   44m [place] Ament Magna perimount bovine pericardial tissue valve, model 3000  . S/P valve-in-valve TAVR 04/15/2019   26 mm Trygve Thal Sapien 3 Ultra transcatheter heart valve placed via percutaneous right transfemoral approach   . SBE (subacute bacterial endocarditis) prophylaxis candidate  for dental procedures  . Severe aortic stenosis    S/P prosthetic valve replacement w 25 mm Krupa Stege like science percardial tissue valve,Turner - 01/2008    Past Surgical History:  Procedure Laterality Date  . APPENDECTOMY    . BOTOX INJECTION N/A 01/22/2018   Procedure: BOTOX INJECTION;  Surgeon: Ronnette Juniper, MD;  Location: [facility];  Service: Gastroenterology;  Laterality: N/A;  . CARDIAC CATHETERIZATION     09  . CARDIAC VALVE REPLACEMENT  01/2008   aortic valve replacement  . CATARACT EXTRACTION W/ INTRAOCULAR LENS  IMPLANT, BILATERAL     . COLON RESECTION     diverticulitis   . dental implants     permanent  . ESOPHAGEAL MANOMETRY N/A 11/07/2017   Procedure: ESOPHAGEAL MANOMETRY (EM);  Surgeon: Ronnette Juniper, MD;  Location: [facility];  Service: Gastroenterology;  Laterality: N/A;  . ESOPHAGOGASTRODUODENOSCOPY (EGD) WITH PROPOFOL N/A 01/22/2018   Procedure: ESOPHAGOGASTRODUODENOSCOPY (EGD) WITH PROPOFOL;  Surgeon: Ronnette Juniper, MD;  Location[facility];  Service: Gastroenterology;  Laterality: N/A;  . EXCISIONAL TOTAL SHOULDER ARTHROPLASTY WITH ANTIBIOTIC SPACER Right 12/31/2018   Procedure: EXCISIONAL TOTAL SHOULDER ARTHROPLASTY WITH ANTIBIOTIC SPACER;  Surgeon: Netta Cedars, MD;  Location: [facility];  Service: Orthopedics;  Laterality: Right;  . HERNIA REPAIR    . INTRAOPERATIVE TRANSTHORACIC ECHOCARDIOGRAM N/A 04/15/2019   Procedure: Intraoperative Transthoracic Echocardiogram;  Surgeon: Sherren Mocha, MD;  Location: [place];  Service: Open Heart Surgery;  Laterality: N/A;  . IRRIGATION AND DEBRIDEMENT SHOULDER Right 11/20/2018    IRRIGATION AND DEBRIDEMENT SHOULDER WITH POLY EXCHANGE (Right Shoulder)  . IRRIGATION AND DEBRIDEMENT SHOULDER Right 11/20/2018   Procedure: IRRIGATION AND DEBRIDEMENT SHOULDER WITH POLY EXCHANGE;  Surgeon: Netta Cedars, MD;  Location: [place];  Service: Orthopedics;  Laterality: Right;  . LUMBAR LAMINECTOMY     x 2  . REVERSE SHOULDER ARTHROPLASTY Right 03/01/2018   Procedure: RIGHT REVERSE SHOULDER ARTHROPLASTY;  Surgeon: Netta Cedars, MD;  Location: [place];  Service: Orthopedics;  Laterality: Right;  . RIGHT/LEFT HEART CATH AND CORONARY ANGIOGRAPHY N/A 04/02/2019   Procedure: RIGHT/LEFT HEART CATH AND CORONARY ANGIOGRAPHY;  Surgeon: Leonie Man, MD;  Location: [place] [facility];  Service: Cardiovascular;  Laterality: N/A;  . SAVORY DILATION N/A 01/22/2018   Procedure: SAVORY DILATION;  Surgeon: Ronnette Juniper, MD;  [facility];  Service: Gastroenterology;  Laterality: N/A;  . SHOULDER  HEMI-ARTHROPLASTY Right 06/14/2018   Procedure: RIGHT  REVERSE TOTAL SHOULDER OPEN POLY EXCHANGE;  Surgeon: Netta Cedars, MD;  Location: [place];  Service: Orthopedics;  Laterality: Right;  . TEE WITHOUT CARDIOVERSION N/A 01/29/2014   Procedure: TRANSESOPHAGEAL ECHOCARDIOGRAM (TEE);  Surgeon: Sueanne Margarita, MD;  Location: [facility] END[facility]COPY;  Service: Cardiovascular;  Laterality: N/A;  . TEE WITHOUT CARDIOVERSION N/A 04/02/2019   Procedure: TRANSESOPHAGEAL ECHOCARDIOGRAM (TEE);  Surgeon: Josue Hector, MD;  Location[facility];  Service: Cardiovascular;  Laterality: N/A;  . TONSILLECTOMY    . TRANSCATHETER AORTIC VALVE REPLACEMENT, TRANSFEMORAL  04/15/2019  . TRANSCATHETER AORTIC VALVE REPLACEMENT, TRANSFEMORAL N/A 04/15/2019   Procedure: TRANSCATHETER AORTIC VALVE REPLACEMENT, TRANSFEMORAL with POST BALLOON DILATION;  Surgeon: Sherren Mocha, MD;  Location: [place];  Service: Open Heart Surgery;  Laterality: N/A;    Family History  Problem Relation Age of Onset  . Other Mother        NO HEALTH PROBLEMS  . Heart disease Father   . Arthritis Father   . Heart Problems Sister        RELATED TO A MVA  .  Suicidality Brother   . Other Sister        [place]  . Other Brother        2 [place]  . Other Daughter        2 IN GOOD HEALTH    Social History:  reports that he quit smoking about 35 years ago. His smoking use included cigarettes. He started smoking about 45 years ago. He has a 5.00 pack-year smoking history. He has quit using smokeless tobacco.  His smokeless tobacco use included chew. He reports previous alcohol use. He reports that he does not use drugs.  Allergies: No Known Allergies  Medications; Prior to Admission medications   Medication Sig Start Date End Date Taking? Authorizing Provider  acetaminophen (TYLENOL) 500 MG tablet Take 1,000 mg by mouth every 6 (six) hours as needed for moderate pain or headache.   Yes [provider]   aspirin EC 81 MG EC tablet Take 1 tablet (81 mg total) by mouth daily. 01/02/14  Yes Turner, Eber Hong, MD  cholecalciferol (VITAMIN D3) 25 MCG (1000 UT) tablet Take 1,000 Units by mouth daily.   Yes [provider]  clopidogrel (PLAVIX) 75 MG tablet Take 1 tablet (75 mg total) by mouth daily with breakfast. 04/17/19  Yes Eileen Stanford, PA-C  folic acid (FOLVITE) 1 MG tablet Take 1 mg by mouth daily.   Yes [provider]  furosemide (LASIX) 40 MG tablet Take 40 mg daily. 04/16/19  Yes Eileen Stanford, PA-C  leflunomide (ARAVA) 20 MG tablet Take 20 mg by mouth daily.   Yes [provider]  MEGARED OMEGA-3 KRILL OIL PO Take 750 mg by mouth daily.    Yes [provider]  Melatonin 3 MG TABS Take 1 tablet by mouth at bedtime.   Yes [provider]  methotrexate (RHEUMATREX) 2.5 MG tablet Take 15 mg by mouth every Monday. Caution:Chemotherapy. Protect from light.   Yes [provider]  metoprolol succinate (TOPROL-XL) 25 MG 24 hr tablet Take 1 tablet by mouth once daily Patient taking differently: Take 25 mg by mouth daily.  03/17/19  Yes Turner, Eber Hong, MD  Multiple Vitamin (MULTIVITAMIN WITH MINERALS) TABS tablet Take 1 tablet by mouth daily.   Yes [provider]  pantoprazole (PROTONIX) 40 MG tablet Take 40 mg by mouth daily before breakfast.    Yes [provider]  potassium chloride SA (K-DUR) 20 MEQ tablet Take 20 meq daily 04/16/19  Yes Eileen Stanford, PA-C  predniSONE (DELTASONE) 5 MG tablet Take 5 mg by mouth daily with breakfast.    Yes [provider]  tamsulosin (FLOMAX) 0.4 MG CAPS capsule Take 0.8 mg by mouth daily.   Yes [provider]  THERATEARS 0.25 % SOLN Place 1 drop into both eyes 3 (three) times daily as needed (dry/irritated eyes.).   Yes [provider]  Turmeric 500 MG CAPS Take 500 mg by mouth daily.   Yes [provider]  vitamin B-12 (CYANOCOBALAMIN) 1000  MCG tablet Take 1,000 mcg by mouth daily.   Yes [provider]   . aspirin EC  81 mg Oral Daily  . clopidogrel  75 mg Oral Q breakfast  . enoxaparin (LOVENOX) injection  30 mg Subcutaneous Q24H  . feeding supplement (ENSURE ENLIVE)  237 mL Oral BID BM  . folic acid  1 mg Oral Daily  . leflunomide  20 mg Oral Daily  . Melatonin  1 tablet Oral QHS  . methotrexate  15 mg Oral Q Mon  . metoprolol succinate  25 mg Oral Daily  . pantoprazole  40 mg Oral QAC breakfast  . pneumococcal 23 valent vaccine  0.5 mL Intramuscular Tomorrow-1000  . predniSONE  5 mg Oral Q breakfast  . tamsulosin  0.8 mg Oral Daily  . vitamin B-12  1,000 mcg Oral Daily   PRN Meds acetaminophen-codeine, polyvinyl alcohol Results for orders placed or performed during the hospital encounter of 04/28/19 (from the past 48 hour(s))  CBC with Differential     Status: Abnormal   Collection Time: 04/28/19 10:52 AM  Result Value Ref Range   WBC 5.9 4.0 - 10.5 K/uL   RBC 3.83 (L) 4.22 - 5.81 MIL/uL   Hemoglobin 9.4 (L) 13.0 - 17.0 g/dL   HCT 29.6 (L) 39.0 - 52.0 %   MCV 77.3 (L) 80.0 - 100.0 fL   MCH 24.5 (L) 26.0 - 34.0 pg   MCHC 31.8 30.0 - 36.0 g/dL   RDW 23.3 (H) 11.5 - 15.5 %   Platelets 90 (L) 150 - 400 K/uL    Comment: REPEATED TO VERIFY PLATELET COUNT CONFIRMED BY SMEAR Immature Platelet Fraction may be clinically indicated, consider ordering this additional test [id number]    nRBC 0.3 (H) 0.0 - 0.2 %   Neutrophils Relative % 78 %   Neutro Abs 4.7 1.7 - 7.7 K/uL   Lymphocytes Relative 7 %   Lymphs Abs 0.4 (L) 0.7 - 4.0 K/uL   Monocytes Relative 10 %   Monocytes Absolute 0.6 0.1 - 1.0 K/uL   Eosinophils Relative 3 %   Eosinophils Absolute 0.2 0.0 - 0.5 K/uL   Basophils Relative 1 %   Basophils Absolute 0.0 0.0 - 0.1 K/uL   Immature Granulocytes 1 %   Abs Immature Granulocytes 0.03 0.00 - 0.07 K/uL    Comment: Performed at [facility], [address], [place], [place] [address]   Comprehensive metabolic panel     Status: Abnormal   Collection Time: 04/28/19 10:52 AM  Result Value Ref Range   Sodium 139 135 - 145 mmol/L   Potassium 4.0 3.5 - 5.1 mmol/L   Chloride 95 (L) 98 - 111 mmol/L   CO2 30 22 - 32 mmol/L   Glucose, Bld 104 (H) 70 - 99 mg/dL   BUN 28 (H) 8 - 23 mg/dL   Creatinine, Ser 1.52 (H) 0.61 - 1.24 mg/dL   Calcium 9.7 8.9 - 10.3 mg/dL   Total Protein 6.4 (L) 6.5 - 8.1 g/dL   Albumin 3.8 3.5 - 5.0 g/dL   AST 38 15 - 41 U/L   ALT 19 0 - 44 U/L   Alkaline Phosphatase 79 38 - 126 U/L   Total Bilirubin 1.6 (H) 0.3 - 1.2 mg/dL   GFR calc non Af Amer 42 (L) >60 mL/min   GFR calc Af Amer 49 (L) >60 mL/min   Anion gap 14 5 - 15    Comment: Performed at [facility], [place] [address], [place], [place] [address]  Brain natriuretic peptide     Status: Abnormal   Collection Time: 04/28/19 10:52 AM  Result Value Ref Range   B Natriuretic Peptide 332.0 (H) 0.0 - 100.0 pg/mL    Comment: Performed at [facility] [address], [place], [place] [address]  Troponin I (High Sensitivity)     Status: Abnormal   Collection Time: 04/28/19 10:52 AM  Result  Value Ref Range   Troponin I (High Sensitivity) 19 (H) <18 ng/L    Comment: (NOTE) Elevated high sensitivity troponin I (hsTnI) values and significant  changes across serial measurements may suggest ACS but many other  chronic and acute conditions are known to elevate hsTnI results.  Refer to the "Links" section for chest pain algorithms and additional  guidance. Performed at [facility], [place] [address] [place], [place] [address]   I-stat chem 8, ED (not at [facility] or [facility])     Status: Abnormal   Collection Time: 04/28/19 11:08 AM  Result Value Ref Range   Sodium 137 135 - 145 mmol/L   Potassium 4.0 3.5 - 5.1 mmol/L   Chloride 98 98 - 111 mmol/L   BUN 34 (H) 8 - 23 mg/dL   Creatinine, Ser 1.40 (H) 0.61 - 1.24 mg/dL   Glucose, Bld 98 70 - 99 mg/dL   Calcium, Ion 0.88 (LL) 1.15 - 1.40  mmol/L   TCO2 33 (H) 22 - 32 mmol/L   Hemoglobin 11.2 (L) 13.0 - 17.0 g/dL   HCT 33.0 (L) 39.0 - 52.0 %   Comment NOTIFIED PHYSICIAN   Troponin I (High Sensitivity)     Status: Abnormal   Collection Time: 04/28/19 12:24 PM  Result Value Ref Range   Troponin I (High Sensitivity) 19 (H) <18 ng/L    Comment: (NOTE) Elevated high sensitivity troponin I (hsTnI) values and significant  changes across serial measurements may suggest ACS but many other  chronic and acute conditions are known to elevate hsTnI results.  Refer to the "Links" section for chest pain algorithms and additional  guidance. Performed at [facility], [place] [address] [place], [place] [address]   SARS CORONAVIRUS 2 (TAT 6-12 HRS) Nasal Swab Aptima Multi Swab     Status: None   Collection Time: 04/28/19 12:29 PM   Specimen: Aptima Multi Swab; Nasal Swab  Result Value Ref Range   SARS Coronavirus 2 NEGATIVE NEGATIVE    Comment: (NOTE) SARS-CoV-2 target nucleic acids are NOT DETECTED. The SARS-CoV-2 RNA is generally detectable in upper and lower respiratory specimens during the acute phase of infection. Negative results do not preclude SARS-CoV-2 infection, do not rule out co-infections with other pathogens, and should not be used as the sole basis for treatment or other patient management decisions. Negative results must be combined with clinical observations, patient history, and epidemiological information. The expected result is Negative. Fact Sheet for Patients: SugarRoll.be Fact Sheet for Healthcare Providers: https://www.woods-mathews.com/ This test is not yet approved or cleared by the [place] FDA and  has been authorized for detection and/or diagnosis of SARS-CoV-2 by FDA under an Emergency Use Authorization (EUA). This EUA will remain  in effect (meaning this test can be used) for the duration of the COVID-19 declaration under Section 56 4(b)(1) of the  Act, 21 U.S.C. section 360bbb-3(b)(1), unless the authorization is terminated or revoked sooner. Performed at [facility], [place] [address], [place], [place] [address]   Urinalysis, Routine w reflex microscopic     Status: Abnormal   Collection Time: 04/28/19  9:16 PM  Result Value Ref Range   Color, Urine YELLOW YELLOW   APPearance CLEAR CLEAR   Specific Gravity, Urine 1.017 1.005 - 1.030   pH 5.0 5.0 - 8.0   Glucose, UA NEGATIVE NEGATIVE mg/dL   Hgb urine dipstick NEGATIVE NEGATIVE   Bilirubin Urine NEGATIVE NEGATIVE   Ketones, ur 20 (A) NEGATIVE mg/dL  Protein, ur 30 (A) NEGATIVE mg/dL   Nitrite NEGATIVE NEGATIVE   Leukocytes,Ua NEGATIVE NEGATIVE   RBC / HPF 0-5 0 - 5 RBC/hpf   WBC, UA 0-5 0 - 5 WBC/hpf   Bacteria, UA RARE (A) NONE SEEN   Mucus PRESENT    Hyaline Casts, UA PRESENT     Comment: Performed at [facility] [address], [place], [place] [address]  Basic metabolic panel     Status: Abnormal   Collection Time: 04/29/19  3:09 AM  Result Value Ref Range   Sodium 138 135 - 145 mmol/L   Potassium 3.4 (L) 3.5 - 5.1 mmol/L   Chloride 101 98 - 111 mmol/L   CO2 26 22 - 32 mmol/L   Glucose, Bld 72 70 - 99 mg/dL   BUN 26 (H) 8 - 23 mg/dL   Creatinine, Ser 1.22 0.61 - 1.24 mg/dL   Calcium 8.0 (L) 8.9 - 10.3 mg/dL   GFR calc non Af Amer 55 (L) >60 mL/min   GFR calc Af Amer >60 >60 mL/min   Anion gap 11 5 - 15    Comment: Performed at [facility], [place] [address], [place], [place] [address]  CBC     Status: Abnormal   Collection Time: 04/29/19  3:09 AM  Result Value Ref Range   WBC 4.7 4.0 - 10.5 K/uL   RBC 3.08 (L) 4.22 - 5.81 MIL/uL   Hemoglobin 7.6 (L) 13.0 - 17.0 g/dL    Comment: Reticulocyte Hemoglobin testing may be clinically indicated, consider ordering this additional test [id number] REPEATED TO VERIFY    HCT 23.6 (L) 39.0 - 52.0 %   MCV 76.6 (L) 80.0 - 100.0 fL   MCH 24.7 (L) 26.0 - 34.0 pg   MCHC 32.2 30.0 - 36.0 g/dL   RDW 23.0  (H) 11.5 - 15.5 %   Platelets 72 (L) 150 - 400 K/uL    Comment: REPEATED TO VERIFY Immature Platelet Fraction may be clinically indicated, consider ordering this additional test [id number] CONSISTENT WITH PREVIOUS RESULT    nRBC 0.0 0.0 - 0.2 %    Comment: Performed at [facility], [place] [address] [place], [place] [address]    Dg Chest Port 1 View  Result Date: 04/28/2019 CLINICAL DATA:  Shortness of breath EXAM: PORTABLE CHEST 1 VIEW COMPARISON:  April 15, 2019 FINDINGS: There is no edema or consolidation. Heart size and pulmonary vascularity are normal. No adenopathy. Patient is status post aortic valve replacement. There is aortic atherosclerosis. There is a total shoulder replacement on right with apparent anterior dislocation on right, stable. IMPRESSION: No edema or consolidation. Heart size normal. Status post aortic valve replacement. Chronic anterior dislocation right shoulder with postoperative change in this area. Aortic Atherosclerosis (ICD10-I70.0). Electronically Signed   By: Lowella Grip III M.D.   On: 04/28/2019 11:17   Dg Esophagus W Single Cm (sol Or Thin Ba)  Result Date: 04/28/2019 CLINICAL DATA:  Weakness, failure to thrive, weight loss. Difficulty drinking and eating for the last 2 days. Persistent throat clearing. EXAM: ESOPHOGRAM/BARIUM SWALLOW TECHNIQUE: Single contrast examination was performed using  thin barium. FLUOROSCOPY TIME:  Fluoroscopy Time:  2 minutes, 0 seconds Radiation Exposure Index (if provided by the fluoroscopic device): 11.7 mGy Number of Acquired Spot Images: 0 COMPARISON:  Chest CT from 04/07/2019 and 03/10/2019 FINDINGS: Because of the patient's frailty and clinical situation, the entire exam was performed using thin barium with the patient in the LPO  position. Various observations include aortic stent and dislocated right glenohumeral joint with prosthetic right humerus. There is disruption of primary peristaltic waves in the upper  esophagus on various swallows compatible with esophageal dysmotility. Slight prominence of the esophageal impression on the esophagus but without a discrete mass in this vicinity. Because of the dysmotility, there is prominence of the upper thoracic esophagus and the patient's throat clearing suggests that this dysmotility may be causing backing up of secretions and barium prompting Mr. Tabet to repeatedly clear his throat. Due to secondary and tertiary contractions as well as the underlying dysmotility, it is difficult to exclude narrowing of the distal most esophagus for example on image 10/8, because contrast with slowly percolated from the esophagus into the stomach instead of briskly passing on 3 peristalsis. Accordingly, I cannot completely exclude a smooth stricture of the distal esophagus. The most I was able to distend this up to was about 5 mm (image 10/8). I considered administering a barium pill, but given the dysmotility I am very skeptical that the barium pill would successfully make its way down to the distal esophagus due challenge the region in a realistic timeframe. IMPRESSION: 1. Nonspecific esophageal dysmotility disorder. 2. Maximum distention of the distal most esophagus was about 5 mm. This may well simply be due to dysmotility but I cannot exclude a smooth stricture. 3. 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 4. The patient repeatedly cleared his throat before, during, and after the examination, suggesting possible backing up of secretions into the pharynx/hypopharynx region. This may well be secondary to the dysmotility. I did not observe a definite high-grade obstruction to fluids, but rather generalized poor transit due to dysmotility. Electronically Signed   By: Van Clines M.D.   On: 04/28/2019 16:28               Blood pressure 101/63, pulse 61, temperature 97.8 F (36.6 C), temperature source Oral, resp. rate 14, height _0  (1.6 m), weight 39.9 kg,  SpO2 100 %.  Physical exam:   General--extremely frail white male sitting in the chair, cachectic ENT--nonicteric Neck--supple full range of motion Heart--slight murmur Lungs--diminished breath sounds bilaterally Abdomen--upper scar from thoracotomy appendectomy scar, nondistended soft and nontender Psych--alert and oriented answers questions appropriately   Assessment: 1.  Dysphagia.  Patient nearly met all the criteria for achalasia a year ago other than complete aperistalsis.  He is continued to decline with extensive weight loss.  He failed to respond to injection of Botox.  I doubt further Botox injection will help.  I think he clearly has a achalasia and does not need another manometry, he will need something to disrupt the LES.  Choice was will be bag dilatation which has a 3 to 5% perforation risk, laparoscopic myotomy of the LES.  He may have had some scar tissue there but this may be the best choice for him.  There may be some other transendoscopic GI procedure would be reasonable to consider.  I think the first thing we need to do is take another look and try to perform regular dilatation to see if this will allow him to get better nutrition at least temporarily. 2.  Status post AVR with bioprosthetic valve 2009 with TAVR approximately 2 weeks ago.  Patient will need preoperative antibiotic prophylaxis. 3.  Rheumatoid arthritis 4.  Extensive weight loss and failure to thrive  Plan: 1.  We will go ahead with EGD and [facility] dilatation in the morning to evaluate the GE junction  and hopefully allow him to at least get some liquid nutritional supplements in the short-term.  Will discuss with other gastroenterologist possible treatments for achalasia.   Nancy Fetter 04/29/2019, 8:10 AM   This note was created using voice recognition software and minor errors may Have occurred unintentionally. Pager: [phone] If no answer or after hours call [phone]  Reviewed the BS,  severe dysmotility, dilated lumen with marked narrowing at the GEJ. EGD and dilatation may help and r/o any other problems such as tumor.

## 2019-04-29 NOTE — Progress Notes (Signed)
PROGRESS NOTE  Joshua Hudson [name] DOB: [birth year] DOA: 04/28/2019 PCP: Joshua Orn, MD  HPI/Recap of past 24 hours: HPI from Dr Donnamae Hudson is an 84 y.o. male medical history significant for esophageal stricture s/p dilation May 2019, severe AS s/p TAVR 2 weeks ago, MR, HFpEF, RA on prednisone and MTX who now presents with progressive weakness and fatigue with poor oral intake as he has been having a very difficult time swallowing even liquids for the past 2 weeks.  Noted significant weight loss over the past few months.  Patient denies any shortness of breath, no chest pain, cough, fevers/chills or malaise. Denies any dizziness syncope or presyncope.  No falls. In the ED, patient was noted to be cachectic, normotensive with mild renal insufficiency.  Patient admitted for further management.   Today, patient reports feeling better, still unable to swallow.  Denies any worsening shortness of breath, chest pain, nausea/vomiting, abdominal pain, fever/chills.  Assessment/Plan: Active Problems:   S/P aortic valve replacement with bioprosthetic valve   Dysphagia   Mitral regurgitation   Hypertension   Rheumatoid arthritis (HCC)   GERD (gastroesophageal reflux disease)   FTT (failure to thrive) in adult   History of esophageal stricture   Odynophagia   Dysphagia Likely 2/2 esophageal stricture/?Achalasia, rule out mass Barium swallow showed nonspecific esophageal dysmotility disorder Had an esophageal manometry on 11/07/2017 which showed elevated LES resting and relaxation pressure, but no diagnosis of achalasia was made due to intact peristalsis on majority of swallows GI on board, plan for EGD and esophageal dilatation on 04/30/2019 Advance as tolerated, continue IV fluids  Failure to thrive/generalized weakness/severe malnutrition Likely 2/2 poor oral intake 2/2 dysphagia, as mentioned above GI on board, plan for esophageal dilatation Continue IV fluids for  now, clear liquid as tolerated Dietitian consulted  AKI Likely due to poor oral intake in addition to taking diuretics Continue IV fluids, continue to hold diuretics Daily BMP  Microcytic anemia of chronic disease/thrombocytopenia Baseline hemoglobin between 7-8 Denies any obvious bleeding Anemia panel pending Type and screen done, continues if hemoglobin less than 7 Daily CBC  Chronic diastolic HF/Hx of aortic stenosis s/p TAVR/Hx of Afib s/p ablation Stable, appears euvolemic Echo on 8/20 showed EF of 60 to 65% Hold diuretics for now Stop IV fluids once able to tolerate orally Continue Plavix Cardiology on board  Hypertension Stable Continue metoprolol  Rheumatoid arthritis Stable Continue steroids, leflunomide, hold methotrexate  GERD Continue PPI  BPH Continue tamsulosin          Malnutrition Type:      Malnutrition Characteristics:      Nutrition Interventions:       Estimated body mass index is 15.59 kg/m as calculated from the following:   Height as of this encounter: 5\' 3"  (1.6 m).   Weight as of this encounter: 39.9 kg.     Code Status: Full  Family Communication: None at bedside  Disposition Plan: To be determined   Consultants:  GI  Cardiology  Procedures:  None  Antimicrobials:  None  DVT prophylaxis: SCD for now   Objective: Vitals:   04/28/19 1715 04/28/19 1759 04/28/19 2316 04/29/19 0704  BP: 126/71 127/80 103/60 101/63  Pulse: 65 71 64 61  Resp: 15 17 20 14   Temp:  97.8 F (36.6 C) 98 F (36.7 C) 97.8 F (36.6 C)  TempSrc:  Oral  Oral  SpO2: 100% 100% 99% 100%  Weight:      Height:  Intake/Output Summary (Last 24 hours) at 04/29/2019 1011 Last data filed at 04/29/2019 0303 Gross per 24 hour  Intake 281.29 ml  Output 270 ml  Net 11.29 ml   Filed Weights   04/28/19 1025  Weight: 39.9 kg    Exam:  General: NAD, cachectic, extremely emaciated  Cardiovascular: S1, S2 present   Respiratory: CTAB  Abdomen: Soft, nontender, nondistended, bowel sounds present  Musculoskeletal: No bilateral pedal edema noted, right foot ecchymosis  Skin: Normal  Psychiatry: Normal mood   Data Reviewed: CBC: Recent Labs  Lab 04/23/19 1432 04/28/19 1052 04/28/19 1108 04/29/19 0309  WBC 5.2 5.9  --  4.7  NEUTROABS 3.9 4.7  --   --   HGB 9.1* 9.4* 11.2* 7.6*  HCT 29.3* 29.6* 33.0* 23.6*  MCV 80 77.3*  --  76.6*  PLT 74* 90*  --  72*   Basic Metabolic Panel: Recent Labs  Lab 04/23/19 1432 04/28/19 1052 04/28/19 1108 04/29/19 0309  NA 139 139 137 138  K 4.3 4.0 4.0 3.4*  CL 97 95* 98 101  CO2 30* 30  --  26  GLUCOSE 114* 104* 98 72  BUN 24 28* 34* 26*  CREATININE 1.15 1.52* 1.40* 1.22  CALCIUM 9.9 9.7  --  8.0*   GFR: Estimated Creatinine Clearance: 26.3 mL/min (by C-G formula based on SCr of 1.22 mg/dL). Liver Function Tests: Recent Labs  Lab 04/28/19 1052  AST 38  ALT 19  ALKPHOS 79  BILITOT 1.6*  PROT 6.4*  ALBUMIN 3.8   No results for input(s): LIPASE, AMYLASE in the last 168 hours. No results for input(s): AMMONIA in the last 168 hours. Coagulation Profile: No results for input(s): INR, PROTIME in the last 168 hours. Cardiac Enzymes: No results for input(s): CKTOTAL, CKMB, CKMBINDEX, TROPONINI in the last 168 hours. BNP (last 3 results) Recent Labs    03/18/19 1542 04/23/19 1432  PROBNP 3,511* 1,582*   HbA1C: No results for input(s): HGBA1C in the last 72 hours. CBG: No results for input(s): GLUCAP in the last 168 hours. Lipid Profile: No results for input(s): CHOL, HDL, LDLCALC, TRIG, CHOLHDL, LDLDIRECT in the last 72 hours. Thyroid Function Tests: No results for input(s): TSH, T4TOTAL, FREET4, T3FREE, THYROIDAB in the last 72 hours. Anemia Panel: No results for input(s): VITAMINB12, FOLATE, FERRITIN, TIBC, IRON, RETICCTPCT in the last 72 hours. Urine analysis:    Component Value Date/Time   COLORURINE YELLOW 04/28/2019 2116    APPEARANCEUR CLEAR 04/28/2019 2116   LABSPEC 1.017 04/28/2019 2116   PHURINE 5.0 04/28/2019 2116   GLUCOSEU NEGATIVE 04/28/2019 2116   HGBUR NEGATIVE 04/28/2019 2116   BILIRUBINUR NEGATIVE 04/28/2019 2116   KETONESUR 20 (A) 04/28/2019 2116   PROTEINUR 30 (A) 04/28/2019 2116   UROBILINOGEN 0.2 01/14/2008 0530   NITRITE NEGATIVE 04/28/2019 2116   LEUKOCYTESUR NEGATIVE 04/28/2019 2116   Sepsis Labs: @LABRCNTIP (procalcitonin:4,lacticidven:4)  ) Recent Results (from the past 240 hour(s))  SARS CORONAVIRUS 2 (TAT 6-12 HRS) Nasal Swab Aptima Multi Swab     Status: None   Collection Time: 04/28/19 12:29 PM   Specimen: Aptima Multi Swab; Nasal Swab  Result Value Ref Range Status   SARS Coronavirus 2 NEGATIVE NEGATIVE Final    Comment: (NOTE) SARS-CoV-2 target nucleic acids are NOT DETECTED. The SARS-CoV-2 RNA is generally detectable in upper and lower respiratory specimens during the acute phase of infection. Negative results do not preclude SARS-CoV-2 infection, do not rule out co-infections with other pathogens, and should not be used as  the sole basis for treatment or other patient management decisions. Negative results must be combined with clinical observations, patient history, and epidemiological information. The expected result is Negative. Fact Sheet for Patients: SugarRoll.be Fact Sheet for Healthcare Providers: https://www.woods-mathews.com/ This test is not yet approved or cleared by the [place] FDA and  has been authorized for detection and/or diagnosis of SARS-CoV-2 by FDA under an Emergency Use Authorization (EUA). This EUA will remain  in effect (meaning this test can be used) for the duration of the COVID-19 declaration under Section 56 4(b)(1) of the Act, 21 U.S.C. section 360bbb-3(b)(1), unless the authorization is terminated or revoked sooner. Performed at [facility], [place] [address], [place], [place]  [address]       Studies: Dg Chest Port 1 View  Result Date: 04/28/2019 CLINICAL DATA:  Shortness of breath EXAM: PORTABLE CHEST 1 VIEW COMPARISON:  April 15, 2019 FINDINGS: There is no edema or consolidation. Heart size and pulmonary vascularity are normal. No adenopathy. Patient is status post aortic valve replacement. There is aortic atherosclerosis. There is a total shoulder replacement on right with apparent anterior dislocation on right, stable. IMPRESSION: No edema or consolidation. Heart size normal. Status post aortic valve replacement. Chronic anterior dislocation right shoulder with postoperative change in this area. Aortic Atherosclerosis (ICD10-I70.0). Electronically Signed   By: Lowella Grip III M.D.   On: 04/28/2019 11:17   Dg Esophagus W Single Cm (sol Or Thin Ba)  Result Date: 04/28/2019 CLINICAL DATA:  Weakness, failure to thrive, weight loss. Difficulty drinking and eating for the last 2 days. Persistent throat clearing. EXAM: ESOPHOGRAM/BARIUM SWALLOW TECHNIQUE: Single contrast examination was performed using  thin barium. FLUOROSCOPY TIME:  Fluoroscopy Time:  2 minutes, 0 seconds Radiation Exposure Index (if provided by the fluoroscopic device): 11.7 mGy Number of Acquired Spot Images: 0 COMPARISON:  Chest CT from 04/07/2019 and 03/10/2019 FINDINGS: Because of the patient's frailty and clinical situation, the entire exam was performed using thin barium with the patient in the LPO position. Various observations include aortic stent and dislocated right glenohumeral joint with prosthetic right humerus. There is disruption of primary peristaltic waves in the upper esophagus on various swallows compatible with esophageal dysmotility. Slight prominence of the esophageal impression on the esophagus but without a discrete mass in this vicinity. Because of the dysmotility, there is prominence of the upper thoracic esophagus and the patient's throat clearing suggests that this dysmotility  may be causing backing up of secretions and barium prompting Mr. Beckius to repeatedly clear his throat. Due to secondary and tertiary contractions as well as the underlying dysmotility, it is difficult to exclude narrowing of the distal most esophagus for example on image 10/8, because contrast with slowly percolated from the esophagus into the stomach instead of briskly passing on 3 peristalsis. Accordingly, I cannot completely exclude a smooth stricture of the distal esophagus. The most I was able to distend this up to was about 5 mm (image 10/8). I considered administering a barium pill, but given the dysmotility I am very skeptical that the barium pill would successfully make its way down to the distal esophagus due challenge the region in a realistic timeframe. IMPRESSION: 1. Nonspecific esophageal dysmotility disorder. 2. Maximum distention of the distal most esophagus was about 5 mm. This may well simply be due to dysmotility but I cannot exclude a smooth stricture. 3. 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 4. The patient repeatedly cleared his throat before, during, and after the examination,  suggesting possible backing up of secretions into the pharynx/hypopharynx region. This may well be secondary to the dysmotility. I did not observe a definite high-grade obstruction to fluids, but rather generalized poor transit due to dysmotility. Electronically Signed   By: Van Clines M.D.   On: 04/28/2019 16:28    Scheduled Meds: . aspirin EC  81 mg Oral Daily  . clopidogrel  75 mg Oral Q breakfast  . enoxaparin (LOVENOX) injection  30 mg Subcutaneous Q24H  . feeding supplement (ENSURE ENLIVE)  237 mL Oral BID BM  . folic acid  1 mg Oral Daily  . leflunomide  20 mg Oral Daily  . Melatonin  1 tablet Oral QHS  . metoprolol succinate  25 mg Oral Daily  . pantoprazole  40 mg Oral QAC breakfast  . pneumococcal 23 valent vaccine  0.5 mL Intramuscular Tomorrow-1000  . predniSONE  5 mg Oral  Q breakfast  . tamsulosin  0.8 mg Oral Daily  . vitamin B-12  1,000 mcg Oral Daily    Continuous Infusions: . sodium chloride 75 mL/hr at 04/28/19 2305  . [START ON 04/30/2019] ampicillin (OMNIPEN) IV       LOS: 1 day     Alma Friendly, MD [facility]  If 7PM-7AM, please contact night-coverage [email or web address] 04/29/2019, 10:11 AM

## 2019-04-29 NOTE — Anesthesia Preprocedure Evaluation (Addendum)
Anesthesia Evaluation  Patient identified by MRN, date of birth, ID band Patient awake    Reviewed: Allergy & Precautions, NPO status , Patient's Chart, lab work & pertinent test results  Airway Mallampati: II  TM Distance: >3 FB Neck ROM: Full    Dental no notable dental hx.    Pulmonary former smoker,    Pulmonary exam normal breath sounds clear to auscultation       Cardiovascular hypertension, Pt. on home beta blockers Normal cardiovascular exam+ Valvular Problems/Murmurs AS and MR  Rhythm:Regular Rate:Normal  ECG: SR, rate 64. LAFB  S/p AVR  ECHO:  1. The left ventricle has normal systolic function with an ejection fraction of 60-65%. The cavity size was normal. Left ventricular diastolic Doppler parameters are indeterminate.  2. The right ventricle has normal systolic function. The cavity was normal.  3. Left atrial size was mildly dilated.  4. The mitral valve is abnormal. Mild thickening of the mitral valve leaflet. There is mild mitral annular calcification present.  5. The tricuspid valve is grossly normal.  6. The aorta is normal in size and structure.  7. The interatrial septum is aneurysmal.  8. Normal LV systolic function; s/p TAVR with mean gradient of 14 mmHg and no AI; mild MR; mild LAE; mild TR.   Neuro/Psych negative neurological ROS  negative psych ROS   GI/Hepatic Neg liver ROS, GERD  Medicated and Controlled,  Endo/Other  negative endocrine ROS  Renal/GU negative Renal ROS     Musculoskeletal  (+) Arthritis , Rheumatoid disorders,    Abdominal   Peds  Hematology  (+) anemia ,   Anesthesia Other Findings dysphagia  Reproductive/Obstetrics                            Anesthesia Physical Anesthesia Plan  ASA: III  Anesthesia Plan: MAC   Post-op Pain Management:    Induction: Intravenous  PONV Risk Score and Plan: 1 and Propofol infusion and Treatment may vary  due to age or medical condition  Airway Management Planned: Nasal Cannula  Additional Equipment:   Intra-op Plan:   Post-operative Plan:   Informed Consent: I have reviewed the patients History and Physical, chart, labs and discussed the procedure including the risks, benefits and alternatives for the proposed anesthesia with the patient or authorized representative who has indicated his/her understanding and acceptance.     Dental advisory given  Plan Discussed with: CRNA  Anesthesia Plan Comments:         Anesthesia Quick Evaluation

## 2019-04-29 NOTE — Consult Note (Addendum)
[facility] CONSULT Reason for consult: [facility] hospitalist.  PCP: Dr. Lavone Orn.  Primary GI: Dr. Therisa Doyne primary cardiologist: Dr. Radford Pax Referring Physician:   [name] is an 84 y.o. male.  HPI: He has a very complicated history.  In 2009 he underwent AVR and did fairly well for some time until he developed severe AI.  This resulted in a TA VR by Dr. Burt Knack utilizing trans-femoral approach approximately 2 weeks ago.  The patient has had progressive weakness.  He is continued to be particularly weak ever since the surgery.  In addition to the trans-thoracic AVR, his other surgeries include appendectomy, colon resection due to perforated diverticulitis is a two-stage procedure.  He has had several procedures on his shoulder some type of back operation as well.  He has had previous colonoscopies with removal of colon polyps. He has had fairly persistent problems with dysphagia and difficulty swallowing that has been evaluated by Dr. Therisa Doyne.  He had a manometry last year showing increased LES pressure with increased residual pressure with dysmotility but some peristaltic waves.  Because of this he did not meet all the criteria for achalasia but this was interpreted as very suspicious for early achalasia.  He had TTS balloon dilatation 5/19 with Botox injection at the LES by Dr Therisa Doyne.  The patient notes that this really did not help him for very long.  He said it may have helped a small amount.  He states it really did not help much and things have gotten progressively worse since his aortic valve replacement several weeks ago.  He states that he has lost approximately 25 to 30 pounds over the past 3 to 4 months.  He was hoping the aortic valve replacement would improve things but if anything he has been worse.  He has not had any solid food for some time other than 1 or 2 bites of a sandwich and has been living off of clear liquids.  He regurgitates up a fair amount of this.  He states is his  idea of a meal this 1 can of clear liquid with possibly a bite of a sandwich.  Some days he does not even have that.  He has had progressive fatigue and has been so weak he needed help getting to the bathroom.  Past Medical History:  Diagnosis Date  . Aortic insufficiency 03/24/2019   AI of bioprosthetic AVR  . Chronic diastolic CHF (congestive heart failure) ([place]) 03/24/2019  . Colon polyps    s/p diverticular perforation requiring 2-stage repair  . Diverticulosis   . DJD (degenerative joint disease), lumbosacral   . Esophageal stricture   . GERD (gastroesophageal reflux disease)   . Hemolytic anemia ([place])   . History of blood transfusion    patient states "years ago"  . History of kidney stones    passed stones  . Hypertension   . Mitral regurgitation    moderate  . Occipital neuralgia   . Postoperative atrial fibrillation ([place]) 05/10/2015  . Prosthetic valve dysfunction   . Rheumatoid arthritis ([place])    s/o long term steroids  shoulders and hands  . Rotator cuff arthropathy    right  . S/P aortic valve replacement with bioprosthetic valve 01/16/2008   15m Joshua Hudson perimount bovine pericardial tissue valve, model 3000  . S/P valve-in-valve TAVR 04/15/2019   26 mm Joshua Hudson 3 Ultra transcatheter heart valve placed via percutaneous right transfemoral approach   . SBE (subacute bacterial endocarditis) prophylaxis candidate  for dental procedures  . Severe aortic stenosis    S/P prosthetic valve replacement w 25 mm Latonyia Lopata like science percardial tissue valve,Turner - 01/2008    Past Surgical History:  Procedure Laterality Date  . APPENDECTOMY    . BOTOX INJECTION N/A 01/22/2018   Procedure: BOTOX INJECTION;  Surgeon: Ronnette Juniper, MD;  Location: [facility];  Service: Gastroenterology;  Laterality: N/A;  . CARDIAC CATHETERIZATION     09  . CARDIAC VALVE REPLACEMENT  01/2008   aortic valve replacement  . CATARACT EXTRACTION W/ INTRAOCULAR LENS  IMPLANT, BILATERAL     . COLON RESECTION     diverticulitis   . dental implants     permanent  . ESOPHAGEAL MANOMETRY N/A 11/07/2017   Procedure: ESOPHAGEAL MANOMETRY (EM);  Surgeon: Ronnette Juniper, MD;  [facility];  Service: Gastroenterology;  Laterality: N/A;  . ESOPHAGOGASTRODUODENOSCOPY (EGD) WITH PROPOFOL N/A 01/22/2018   Procedure: ESOPHAGOGASTRODUODENOSCOPY (EGD) WITH PROPOFOL;  Surgeon: Ronnette Juniper, MD;  [facility];  Service: Gastroenterology;  Laterality: N/A;  . EXCISIONAL TOTAL SHOULDER ARTHROPLASTY WITH ANTIBIOTIC SPACER Right 12/31/2018   Procedure: EXCISIONAL TOTAL SHOULDER ARTHROPLASTY WITH ANTIBIOTIC SPACER;  Surgeon: Netta Cedars, MD;  Location: [facility];  Service: Orthopedics;  Laterality: Right;  . HERNIA REPAIR    . INTRAOPERATIVE TRANSTHORACIC ECHOCARDIOGRAM N/A 04/15/2019   Procedure: Intraoperative Transthoracic Echocardiogram;  Surgeon: Sherren Mocha, MD;  Location: [facility];  Service: Open Heart Surgery;  Laterality: N/A;  . IRRIGATION AND DEBRIDEMENT SHOULDER Right 11/20/2018    IRRIGATION AND DEBRIDEMENT SHOULDER WITH POLY EXCHANGE (Right Shoulder)  . IRRIGATION AND DEBRIDEMENT SHOULDER Right 11/20/2018   Procedure: IRRIGATION AND DEBRIDEMENT SHOULDER WITH POLY EXCHANGE;  Surgeon: Netta Cedars, MD;  Location: [facility];  Service: Orthopedics;  Laterality: Right;  . LUMBAR LAMINECTOMY     x 2  . REVERSE SHOULDER ARTHROPLASTY Right 03/01/2018   Procedure: RIGHT REVERSE SHOULDER ARTHROPLASTY;  Surgeon: Netta Cedars, MD;  Location: [facility];  Service: Orthopedics;  Laterality: Right;  . RIGHT/LEFT HEART CATH AND CORONARY ANGIOGRAPHY N/A 04/02/2019   Procedure: RIGHT/LEFT HEART CATH AND CORONARY ANGIOGRAPHY;  Surgeon: Leonie Man, MD;  Location: [facility];  Service: Cardiovascular;  Laterality: N/A;  . SAVORY DILATION N/A 01/22/2018   Procedure: SAVORY DILATION;  Surgeon: Ronnette Juniper, MD;  [facility];  Service: Gastroenterology;  Laterality: N/A;  . SHOULDER  HEMI-ARTHROPLASTY Right 06/14/2018   Procedure: RIGHT  REVERSE TOTAL SHOULDER OPEN POLY EXCHANGE;  Surgeon: Netta Cedars, MD;  Location: [facility];  Service: Orthopedics;  Laterality: Right;  . TEE WITHOUT CARDIOVERSION N/A 01/29/2014   Procedure: TRANSESOPHAGEAL ECHOCARDIOGRAM (TEE);  Surgeon: Sueanne Margarita, MD;  Location: [facility];  Service: Cardiovascular;  Laterality: N/A;  . TEE WITHOUT CARDIOVERSION N/A 04/02/2019   Procedure: TRANSESOPHAGEAL ECHOCARDIOGRAM (TEE);  Surgeon: Josue Hector, MD;  Location: [facility] ENDOSCOPY;  Service: Cardiovascular;  Laterality: N/A;  . TONSILLECTOMY    . TRANSCATHETER AORTIC VALVE REPLACEMENT, TRANSFEMORAL  04/15/2019  . TRANSCATHETER AORTIC VALVE REPLACEMENT, TRANSFEMORAL N/A 04/15/2019   Procedure: TRANSCATHETER AORTIC VALVE REPLACEMENT, TRANSFEMORAL with POST BALLOON DILATION;  Surgeon: Sherren Mocha, MD;  Location: [place];  Service: Open Heart Surgery;  Laterality: N/A;    Family History  Problem Relation Age of Onset  . Other Mother        NO HEALTH PROBLEMS  . Heart disease Father   . Arthritis Father   . Heart Problems Sister        RELATED TO A MVA  .  Suicidality Brother   . Other Sister        [place]  . Other Brother        2 [place]  . Other Daughter        2 IN GOOD HEALTH    Social History:  reports that he quit smoking about 35 years ago. His smoking use included cigarettes. He started smoking about 45 years ago. He has a 5.00 pack-year smoking history. He has quit using smokeless tobacco.  His smokeless tobacco use included chew. He reports previous alcohol use. He reports that he does not use drugs.  Allergies: No Known Allergies  Medications; Prior to Admission medications   Medication Sig Start Date End Date Taking? Authorizing Provider  acetaminophen (TYLENOL) 500 MG tablet Take 1,000 mg by mouth every 6 (six) hours as needed for moderate pain or headache.   Yes [provider]   aspirin EC 81 MG EC tablet Take 1 tablet (81 mg total) by mouth daily. 01/02/14  Yes Turner, Eber Hong, MD  cholecalciferol (VITAMIN D3) 25 MCG (1000 UT) tablet Take 1,000 Units by mouth daily.   Yes [provider]  clopidogrel (PLAVIX) 75 MG tablet Take 1 tablet (75 mg total) by mouth daily with breakfast. 04/17/19  Yes Eileen Stanford, PA-C  folic acid (FOLVITE) 1 MG tablet Take 1 mg by mouth daily.   Yes [provider]  furosemide (LASIX) 40 MG tablet Take 40 mg daily. 04/16/19  Yes Eileen Stanford, PA-C  leflunomide (ARAVA) 20 MG tablet Take 20 mg by mouth daily.   Yes [provider]  MEGARED OMEGA-3 KRILL OIL PO Take 750 mg by mouth daily.    Yes [provider]  Melatonin 3 MG TABS Take 1 tablet by mouth at bedtime.   Yes [provider]  methotrexate (RHEUMATREX) 2.5 MG tablet Take 15 mg by mouth every Monday. Caution:Chemotherapy. Protect from light.   Yes [provider]  metoprolol succinate (TOPROL-XL) 25 MG 24 hr tablet Take 1 tablet by mouth once daily Patient taking differently: Take 25 mg by mouth daily.  03/17/19  Yes Turner, Eber Hong, MD  Multiple Vitamin (MULTIVITAMIN WITH MINERALS) TABS tablet Take 1 tablet by mouth daily.   Yes [provider]  pantoprazole (PROTONIX) 40 MG tablet Take 40 mg by mouth daily before breakfast.    Yes [provider]  potassium chloride SA (K-DUR) 20 MEQ tablet Take 20 meq daily 04/16/19  Yes Eileen Stanford, PA-C  predniSONE (DELTASONE) 5 MG tablet Take 5 mg by mouth daily with breakfast.    Yes [provider]  tamsulosin (FLOMAX) 0.4 MG CAPS capsule Take 0.8 mg by mouth daily.   Yes [provider]  THERATEARS 0.25 % SOLN Place 1 drop into both eyes 3 (three) times daily as needed (dry/irritated eyes.).   Yes [provider]  Turmeric 500 MG CAPS Take 500 mg by mouth daily.   Yes [provider]  vitamin B-12 (CYANOCOBALAMIN) 1000  MCG tablet Take 1,000 mcg by mouth daily.   Yes [provider]   . aspirin EC  81 mg Oral Daily  . clopidogrel  75 mg Oral Q breakfast  . enoxaparin (LOVENOX) injection  30 mg Subcutaneous Q24H  . feeding supplement (ENSURE ENLIVE)  237 mL Oral BID BM  . folic acid  1 mg Oral Daily  . leflunomide  20 mg Oral Daily  . Melatonin  1 tablet Oral QHS  . methotrexate  15 mg Oral Q Mon  . metoprolol succinate  25 mg Oral Daily  . pantoprazole  40 mg Oral QAC breakfast  . pneumococcal 23 valent vaccine  0.5 mL Intramuscular Tomorrow-1000  . predniSONE  5 mg Oral Q breakfast  . tamsulosin  0.8 mg Oral Daily  . vitamin B-12  1,000 mcg Oral Daily   PRN Meds acetaminophen-codeine, polyvinyl alcohol Results for orders placed or performed during the hospital encounter of 04/28/19 (from the past 48 hour(s))  CBC with Differential     Status: Abnormal   Collection Time: 04/28/19 10:52 AM  Result Value Ref Range   WBC 5.9 4.0 - 10.5 K/uL   RBC 3.83 (L) 4.22 - 5.81 MIL/uL   Hemoglobin 9.4 (L) 13.0 - 17.0 g/dL   HCT 29.6 (L) 39.0 - 52.0 %   MCV 77.3 (L) 80.0 - 100.0 fL   MCH 24.5 (L) 26.0 - 34.0 pg   MCHC 31.8 30.0 - 36.0 g/dL   RDW 23.3 (H) 11.5 - 15.5 %   Platelets 90 (L) 150 - 400 K/uL    Comment: REPEATED TO VERIFY PLATELET COUNT CONFIRMED BY SMEAR Immature Platelet Fraction may be clinically indicated, consider ordering this additional test [id number]    nRBC 0.3 (H) 0.0 - 0.2 %   Neutrophils Relative % 78 %   Neutro Abs 4.7 1.7 - 7.7 K/uL   Lymphocytes Relative 7 %   Lymphs Abs 0.4 (L) 0.7 - 4.0 K/uL   Monocytes Relative 10 %   Monocytes Absolute 0.6 0.1 - 1.0 K/uL   Eosinophils Relative 3 %   Eosinophils Absolute 0.2 0.0 - 0.5 K/uL   Basophils Relative 1 %   Basophils Absolute 0.0 0.0 - 0.1 K/uL   Immature Granulocytes 1 %   Abs Immature Granulocytes 0.03 0.00 - 0.07 K/uL    Comment: Performed at [facility], [address], [place], [place] [address]   Comprehensive metabolic panel     Status: Abnormal   Collection Time: 04/28/19 10:52 AM  Result Value Ref Range   Sodium 139 135 - 145 mmol/L   Potassium 4.0 3.5 - 5.1 mmol/L   Chloride 95 (L) 98 - 111 mmol/L   CO2 30 22 - 32 mmol/L   Glucose, Bld 104 (H) 70 - 99 mg/dL   BUN 28 (H) 8 - 23 mg/dL   Creatinine, Ser 1.52 (H) 0.61 - 1.24 mg/dL   Calcium 9.7 8.9 - 10.3 mg/dL   Total Protein 6.4 (L) 6.5 - 8.1 g/dL   Albumin 3.8 3.5 - 5.0 g/dL   AST 38 15 - 41 U/L   ALT 19 0 - 44 U/L   Alkaline Phosphatase 79 38 - 126 U/L   Total Bilirubin 1.6 (H) 0.3 - 1.2 mg/dL   GFR calc non Af Amer 42 (L) >60 mL/min   GFR calc Af Amer 49 (L) >60 mL/min   Anion gap 14 5 - 15    Comment: Performed at [facility], [place] [address], [place], [place] [address]  Brain natriuretic peptide     Status: Abnormal   Collection Time: 04/28/19 10:52 AM  Result Value Ref Range   B Natriuretic Peptide 332.0 (H) 0.0 - 100.0 pg/mL    Comment: Performed at [facility] [address], [place], [place] [address]  Troponin I (High Sensitivity)     Status: Abnormal   Collection Time: 04/28/19 10:52 AM  Result  Value Ref Range   Troponin I (High Sensitivity) 19 (H) <18 ng/L    Comment: (NOTE) Elevated high sensitivity troponin I (hsTnI) values and significant  changes across serial measurements may suggest ACS but many other  chronic and acute conditions are known to elevate hsTnI results.  Refer to the "Links" section for chest pain algorithms and additional  guidance. Performed at [facility], [place] [address] [place], [place] [address]   I-stat chem 8, ED (not at [facility] or [facility])     Status: Abnormal   Collection Time: 04/28/19 11:08 AM  Result Value Ref Range   Sodium 137 135 - 145 mmol/L   Potassium 4.0 3.5 - 5.1 mmol/L   Chloride 98 98 - 111 mmol/L   BUN 34 (H) 8 - 23 mg/dL   Creatinine, Ser 1.40 (H) 0.61 - 1.24 mg/dL   Glucose, Bld 98 70 - 99 mg/dL   Calcium, Ion 0.88 (LL) 1.15 - 1.40  mmol/L   TCO2 33 (H) 22 - 32 mmol/L   Hemoglobin 11.2 (L) 13.0 - 17.0 g/dL   HCT 33.0 (L) 39.0 - 52.0 %   Comment NOTIFIED PHYSICIAN   Troponin I (High Sensitivity)     Status: Abnormal   Collection Time: 04/28/19 12:24 PM  Result Value Ref Range   Troponin I (High Sensitivity) 19 (H) <18 ng/L    Comment: (NOTE) Elevated high sensitivity troponin I (hsTnI) values and significant  changes across serial measurements may suggest ACS but many other  chronic and acute conditions are known to elevate hsTnI results.  Refer to the "Links" section for chest pain algorithms and additional  guidance. Performed at [facility], [place] [address] [place], [place] [address]   SARS CORONAVIRUS 2 (TAT 6-12 HRS) Nasal Swab Aptima Multi Swab     Status: None   Collection Time: 04/28/19 12:29 PM   Specimen: Aptima Multi Swab; Nasal Swab  Result Value Ref Range   SARS Coronavirus 2 NEGATIVE NEGATIVE    Comment: (NOTE) SARS-CoV-2 target nucleic acids are NOT DETECTED. The SARS-CoV-2 RNA is generally detectable in upper and lower respiratory specimens during the acute phase of infection. Negative results do not preclude SARS-CoV-2 infection, do not rule out co-infections with other pathogens, and should not be used as the sole basis for treatment or other patient management decisions. Negative results must be combined with clinical observations, patient history, and epidemiological information. The expected result is Negative. Fact Sheet for Patients: SugarRoll.be Fact Sheet for Healthcare Providers: https://www.woods-mathews.com/ This test is not yet approved or cleared by the [place] FDA and  has been authorized for detection and/or diagnosis of SARS-CoV-2 by FDA under an Emergency Use Authorization (EUA). This EUA will remain  in effect (meaning this test can be used) for the duration of the COVID-19 declaration under Section 56 4(b)(1) of the  Act, 21 U.S.C. section 360bbb-3(b)(1), unless the authorization is terminated or revoked sooner. Performed at [facility], [place] [address], [place], [place] [address]   Urinalysis, Routine w reflex microscopic     Status: Abnormal   Collection Time: 04/28/19  9:16 PM  Result Value Ref Range   Color, Urine YELLOW YELLOW   APPearance CLEAR CLEAR   Specific Gravity, Urine 1.017 1.005 - 1.030   pH 5.0 5.0 - 8.0   Glucose, UA NEGATIVE NEGATIVE mg/dL   Hgb urine dipstick NEGATIVE NEGATIVE   Bilirubin Urine NEGATIVE NEGATIVE   Ketones, ur 20 (A) NEGATIVE mg/dL  Protein, ur 30 (A) NEGATIVE mg/dL   Nitrite NEGATIVE NEGATIVE   Leukocytes,Ua NEGATIVE NEGATIVE   RBC / HPF 0-5 0 - 5 RBC/hpf   WBC, UA 0-5 0 - 5 WBC/hpf   Bacteria, UA RARE (A) NONE SEEN   Mucus PRESENT    Hyaline Casts, UA PRESENT     Comment: Performed at [facility] [address], [place], [place] [address]  Basic metabolic panel     Status: Abnormal   Collection Time: 04/29/19  3:09 AM  Result Value Ref Range   Sodium 138 135 - 145 mmol/L   Potassium 3.4 (L) 3.5 - 5.1 mmol/L   Chloride 101 98 - 111 mmol/L   CO2 26 22 - 32 mmol/L   Glucose, Bld 72 70 - 99 mg/dL   BUN 26 (H) 8 - 23 mg/dL   Creatinine, Ser 1.22 0.61 - 1.24 mg/dL   Calcium 8.0 (L) 8.9 - 10.3 mg/dL   GFR calc non Af Amer 55 (L) >60 mL/min   GFR calc Af Amer >60 >60 mL/min   Anion gap 11 5 - 15    Comment: Performed at [facility], [place] [address] [place], [place] [address]  CBC     Status: Abnormal   Collection Time: 04/29/19  3:09 AM  Result Value Ref Range   WBC 4.7 4.0 - 10.5 K/uL   RBC 3.08 (L) 4.22 - 5.81 MIL/uL   Hemoglobin 7.6 (L) 13.0 - 17.0 g/dL    Comment: Reticulocyte Hemoglobin testing may be clinically indicated, consider ordering this additional test [id number] REPEATED TO VERIFY    HCT 23.6 (L) 39.0 - 52.0 %   MCV 76.6 (L) 80.0 - 100.0 fL   MCH 24.7 (L) 26.0 - 34.0 pg   MCHC 32.2 30.0 - 36.0 g/dL   RDW 23.0  (H) 11.5 - 15.5 %   Platelets 72 (L) 150 - 400 K/uL    Comment: REPEATED TO VERIFY Immature Platelet Fraction may be clinically indicated, consider ordering this additional test [id number] CONSISTENT WITH PREVIOUS RESULT    nRBC 0.0 0.0 - 0.2 %    Comment: Performed at [facility], [place] [address] [place], [place] [address]    Dg Chest Port 1 View  Result Date: 04/28/2019 CLINICAL DATA:  Shortness of breath EXAM: PORTABLE CHEST 1 VIEW COMPARISON:  April 15, 2019 FINDINGS: There is no edema or consolidation. Heart size and pulmonary vascularity are normal. No adenopathy. Patient is status post aortic valve replacement. There is aortic atherosclerosis. There is a total shoulder replacement on right with apparent anterior dislocation on right, stable. IMPRESSION: No edema or consolidation. Heart size normal. Status post aortic valve replacement. Chronic anterior dislocation right shoulder with postoperative change in this area. Aortic Atherosclerosis (ICD10-I70.0). Electronically Signed   By: Lowella Grip III M.D.   On: 04/28/2019 11:17   Dg Esophagus W Single Cm (sol Or Thin Ba)  Result Date: 04/28/2019 CLINICAL DATA:  Weakness, failure to thrive, weight loss. Difficulty drinking and eating for the last 2 days. Persistent throat clearing. EXAM: ESOPHOGRAM/BARIUM SWALLOW TECHNIQUE: Single contrast examination was performed using  thin barium. FLUOROSCOPY TIME:  Fluoroscopy Time:  2 minutes, 0 seconds Radiation Exposure Index (if provided by the fluoroscopic device): 11.7 mGy Number of Acquired Spot Images: 0 COMPARISON:  Chest CT from 04/07/2019 and 03/10/2019 FINDINGS: Because of the patient's frailty and clinical situation, the entire exam was performed using thin barium with the patient in the LPO  position. Various observations include aortic stent and dislocated right glenohumeral joint with prosthetic right humerus. There is disruption of primary peristaltic waves in the upper  esophagus on various swallows compatible with esophageal dysmotility. Slight prominence of the esophageal impression on the esophagus but without a discrete mass in this vicinity. Because of the dysmotility, there is prominence of the upper thoracic esophagus and the patient's throat clearing suggests that this dysmotility may be causing backing up of secretions and barium prompting Mr. Pokorski to repeatedly clear his throat. Due to secondary and tertiary contractions as well as the underlying dysmotility, it is difficult to exclude narrowing of the distal most esophagus for example on image 10/8, because contrast with slowly percolated from the esophagus into the stomach instead of briskly passing on 3 peristalsis. Accordingly, I cannot completely exclude a smooth stricture of the distal esophagus. The most I was able to distend this up to was about 5 mm (image 10/8). I considered administering a barium pill, but given the dysmotility I am very skeptical that the barium pill would successfully make its way down to the distal esophagus due challenge the region in a realistic timeframe. IMPRESSION: 1. Nonspecific esophageal dysmotility disorder. 2. Maximum distention of the distal most esophagus was about 5 mm. This may well simply be due to dysmotility but I cannot exclude a smooth stricture. 3. 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 4. The patient repeatedly cleared his throat before, during, and after the examination, suggesting possible backing up of secretions into the pharynx/hypopharynx region. This may well be secondary to the dysmotility. I did not observe a definite high-grade obstruction to fluids, but rather generalized poor transit due to dysmotility. Electronically Signed   By: Van Clines M.D.   On: 04/28/2019 16:28               Blood pressure 101/63, pulse 61, temperature 97.8 F (36.6 C), temperature source Oral, resp. rate 14, height _0  (1.6 m), weight 39.9 kg,  SpO2 100 %.  Physical exam:   General--extremely frail white male sitting in the chair, cachectic ENT--nonicteric Neck--supple full range of motion Heart--slight murmur Lungs--diminished breath sounds bilaterally Abdomen--upper scar from thoracotomy appendectomy scar, nondistended soft and nontender Psych--alert and oriented answers questions appropriately   Assessment: 1.  Dysphagia.  Patient nearly met all the criteria for achalasia a year ago other than complete aperistalsis.  He is continued to decline with extensive weight loss.  He failed to respond to injection of Botox.  I doubt further Botox injection will help.  I think he clearly has a achalasia and does not need another manometry, he will need something to disrupt the LES.  Choice was will be bag dilatation which has a 3 to 5% perforation risk, laparoscopic myotomy of the LES.  He may have had some scar tissue there but this may be the best choice for him.  There may be some other transendoscopic GI procedure would be reasonable to consider.  I think the first thing we need to do is take another look and try to perform regular dilatation to see if this will allow him to get better nutrition at least temporarily. 2.  Status post AVR with bioprosthetic valve 2009 with TAVR approximately 2 weeks ago.  Patient will need preoperative antibiotic prophylaxis. 3.  Rheumatoid arthritis 4.  Extensive weight loss and failure to thrive  Plan: 1.  We will go ahead with EGD and Savary dilatation in the morning to evaluate the GE junction  and hopefully allow him to at least get some liquid nutritional supplements in the short-term.  Will discuss with other gastroenterologist possible treatments for achalasia.   Nancy Fetter 04/29/2019, 8:10 AM   This note was created using voice recognition software and minor errors may Have occurred unintentionally. Pager: [phone] If no answer or after hours call [phone]  Reviewed the BS,  severe dysmotility, dilated lumen with marked narrowing at the GEJ. EGD and dilatation may help and r/o any other problems such as tumor.

## 2019-04-30 ENCOUNTER — Inpatient Hospital Stay (HOSPITAL_COMMUNITY): Payer: Medicare Other | Admitting: Anesthesiology

## 2019-04-30 ENCOUNTER — Inpatient Hospital Stay (HOSPITAL_COMMUNITY): Payer: Medicare Other

## 2019-04-30 ENCOUNTER — Encounter (HOSPITAL_COMMUNITY)
Admission: EM | Disposition: A | Discharge status: Home / Self Care | Payer: Self-pay | Source: Home / Self Care | Attending: Internal Medicine

## 2019-04-30 ENCOUNTER — Other Ambulatory Visit: Payer: Self-pay

## 2019-04-30 ENCOUNTER — Encounter (HOSPITAL_COMMUNITY): Payer: Self-pay | Admitting: Certified Registered Nurse Anesthetist

## 2019-04-30 DIAGNOSIS — E43 Unspecified severe protein-calorie malnutrition: Secondary | ICD-10-CM

## 2019-04-30 DIAGNOSIS — K220 Achalasia of cardia: Principal | ICD-10-CM

## 2019-04-30 HISTORY — PX: SAVORY DILATION: SHX5439

## 2019-04-30 HISTORY — PX: ESOPHAGOGASTRODUODENOSCOPY (EGD) WITH PROPOFOL: SHX5813

## 2019-04-30 LAB — BASIC METABOLIC PANEL
Anion gap: 7 (ref 5–15)
BUN: 19 mg/dL (ref 8–23)
CO2: 28 mmol/L (ref 22–32)
Calcium: 8.4 mg/dL — ABNORMAL LOW (ref 8.9–10.3)
Chloride: 102 mmol/L (ref 98–111)
Creatinine, Ser: 0.89 mg/dL (ref 0.61–1.24)
GFR calc Af Amer: >60 mL/min (ref >60)
GFR calc non Af Amer: >60 mL/min (ref >60)
Glucose, Bld: 80 mg/dL (ref 70–99)
Potassium: 3.5 mmol/L (ref 3.5–5.1)
Sodium: 137 mmol/L (ref 135–145)

## 2019-04-30 LAB — CBC WITH DIFFERENTIAL/PLATELET
Abs Immature Granulocytes: 0.05 10*3/uL (ref 0.00–0.07)
Basophils Absolute: 0 10*3/uL (ref 0.0–0.1)
Basophils Relative: 1 %
Eosinophils Absolute: 0.3 10*3/uL (ref 0.0–0.5)
Eosinophils Relative: 7 %
HCT: 23.9 % — ABNORMAL LOW (ref 39.0–52.0)
Hemoglobin: 7.7 g/dL — ABNORMAL LOW (ref 13.0–17.0)
Immature Granulocytes: 1 %
Lymphocytes Relative: 8 %
Lymphs Abs: 0.3 10*3/uL — ABNORMAL LOW (ref 0.7–4.0)
MCH: 24.8 pg — ABNORMAL LOW (ref 26.0–34.0)
MCHC: 32.2 g/dL (ref 30.0–36.0)
MCV: 76.8 fL — ABNORMAL LOW (ref 80.0–100.0)
Monocytes Absolute: 0.5 10*3/uL (ref 0.1–1.0)
Monocytes Relative: 12 %
Neutro Abs: 3 10*3/uL (ref 1.7–7.7)
Neutrophils Relative %: 71 %
Platelets: 82 10*3/uL — ABNORMAL LOW (ref 150–400)
RBC: 3.11 MIL/uL — ABNORMAL LOW (ref 4.22–5.81)
RDW: 22.5 % — ABNORMAL HIGH (ref 11.5–15.5)
WBC: 4.2 10*3/uL (ref 4.0–10.5)
nRBC: 0 % (ref 0.0–0.2)

## 2019-04-30 LAB — LACTATE DEHYDROGENASE: LDH: 264 U/L — ABNORMAL HIGH (ref 98–192)

## 2019-04-30 SURGERY — ESOPHAGOGASTRODUODENOSCOPY (EGD) WITH PROPOFOL
Anesthesia: Monitor Anesthesia Care

## 2019-04-30 MED ORDER — LACTATED RINGERS IV SOLN
INTRAVENOUS | Status: DC
  Administered 2019-04-30: 07:00:00 via INTRAVENOUS

## 2019-04-30 MED ORDER — SODIUM CHLORIDE 0.9 % IV SOLN
1.0000 g | Freq: Once | INTRAVENOUS | Status: AC
  Administered 2019-04-30: 14:00:00 1 g via INTRAVENOUS
  Filled 2019-04-30: qty 1000

## 2019-04-30 MED ORDER — SODIUM CHLORIDE 0.9 % IV SOLN: INTRAVENOUS | Status: DC

## 2019-04-30 MED ORDER — LIDOCAINE 2% (20 MG/ML) 5 ML SYRINGE
INTRAMUSCULAR | Status: DC | PRN
  Administered 2019-04-30: 40 mg via INTRAVENOUS

## 2019-04-30 MED ORDER — PROPOFOL 500 MG/50ML IV EMUL
INTRAVENOUS | Status: DC | PRN
  Administered 2019-04-30: 100 ug/kg/min via INTRAVENOUS

## 2019-04-30 SURGICAL SUPPLY — 15 items

## 2019-04-30 NOTE — Op Note (Signed)
[facility] Patient Name: Joshua Hudson Procedure Date : 04/30/2019 MRN: [id number] Attending MD: Nancy Fetter Dr., MD Date of Birth: [birth year] CSN: [id number] Age: 84 Admit Type: Inpatient Procedure:                Upper GI endoscopy with Azzie Almas Dilatiation Indications:              Esophageal dysphagia, manometry last year                            suspicious for early achasia. Providers:                Joyice Faster. Dellia Donnelly Dr., MD, Cleda Daub, RN, Ashley Jacobs, RN, Ladona Ridgel, Technician Referring MD:              Medicines:                Monitored Anesthesia Care Complications:            No immediate complications. Estimated Blood Loss:     Estimated blood loss was minimal. Procedure:                Pre-Anesthesia Assessment:                           - Prior to the procedure, a History and Physical                            was performed, and patient medications and                            allergies were reviewed. The patient's tolerance of                            previous anesthesia was also reviewed. The risks                            and benefits of the procedure and the sedation                            options and risks were discussed with the patient.                            All questions were answered, and informed consent                            was obtained. Prior Anticoagulants: The patient has                            taken Plavix (clopidogrel), last dose was 1 day                            prior to procedure. ASA Grade Assessment: III - A  patient with severe systemic disease. After                            reviewing the risks and benefits, the patient was                            deemed in satisfactory condition to undergo the                            procedure.                           After obtaining informed consent, the endoscope was    passed under direct vision. Throughout the                            procedure, the patient's blood pressure, pulse, and                            oxygen saturations were monitored continuously. The                            GIF-H190 ([id number]) Olympus gastroscope was                            introduced through the mouth, and advanced to the                            second part of duodenum. The upper GI endoscopy was                            accomplished without difficulty. The patient                            tolerated the procedure well. Scope In: Scope Out: Findings:      The lumen of the esophagus was moderately dilated.      One moderate stenosis was found. The stenosis was traversed. Was located       38 cm from the teeth, scope passed with gentle pressure. A guidewire was       placed under fluoroscopic guidance and the scope was withdrawn. Dilation       was performed with a Savary dilator with no resistance at 14 mm. Small       amount of heme, wire removed.      The stomach was normal.      The examined duodenum was normal. Impression:               - Dilation in the entire esophagus.                           - Normal stomach.                           - Normal examined duodenum.                           -  No specimens collected.                           - The examination was suspicious for achalasia.                            Dilated. Moderate Sedation:      MAC by anesthesia Recommendation:           - Return patient to hospital ward for ongoing care.                           - Continue present medications.                           - Clear liquid diet today. Procedure Code(s):        --- Professional ---                           [phone], Esophagogastroduodenoscopy, flexible,                            transoral; with insertion of guide wire followed by                            passage of dilator(s) through esophagus over guide                             wire Diagnosis Code(s):        --- Professional ---                           K22.8, Other specified diseases of esophagus                           R13.14, Dysphagia, pharyngoesophageal phase CPT copyright 2019 American Medical Association. All rights reserved. The codes documented in this report are preliminary and upon coder review may  be revised to meet current compliance requirements. Nancy Fetter Dr., MD 04/30/2019 8:44:20 AM This report has been signed electronically. Number of Addenda: 0

## 2019-04-30 NOTE — Interval H&P Note (Signed)
History and Physical Interval Note:  04/30/2019 7:40 AM  Joshua Hudson  has presented today for surgery, with the diagnosis of dysphagia.  The various methods of treatment have been discussed with the patient and family. After consideration of risks, benefits and other options for treatment, the patient has consented to  Procedure(s) with comments: ESOPHAGOGASTRODUODENOSCOPY (EGD) WITH PROPOFOL (N/A) SAVORY DILATION (N/A) - With fluro as a surgical intervention.  The patient's history has been reviewed, patient examined, no change in status, stable for surgery.  I have reviewed the patient's chart and labs.  Questions were answered to the patient's satisfaction.     Nancy Fetter

## 2019-04-30 NOTE — Plan of Care (Signed)

## 2019-04-30 NOTE — Anesthesia Postprocedure Evaluation (Signed)
Anesthesia Post Note  Patient: Joshua Hudson  Procedure(s) Performed: ESOPHAGOGASTRODUODENOSCOPY (EGD) WITH PROPOFOL (N/A ) SAVORY DILATION (N/A )     Patient location during evaluation: Endoscopy Anesthesia Type: MAC Level of consciousness: awake and alert Pain management: pain level controlled Vital Signs Assessment: post-procedure vital signs reviewed and stable Respiratory status: spontaneous breathing, nonlabored ventilation, respiratory function stable and patient connected to nasal cannula oxygen Cardiovascular status: stable and blood pressure returned to baseline Postop Assessment: no apparent nausea or vomiting Anesthetic complications: no    Last Vitals:  Vitals:   04/30/19 0936 04/30/19 1438  BP: 134/77 115/67  Pulse: 63 (!) 56  Resp:  17  Temp:  36.7 C  SpO2: 100% 100%    Last Pain:  Vitals:   04/30/19 2013  TempSrc:   PainSc: 0-No pain                 Ryan P Ellender

## 2019-04-30 NOTE — Anesthesia Procedure Notes (Signed)
Procedure Name: MAC Date/Time: 04/30/2019 8:18 AM Performed by: Alain Marion, CRNA Pre-anesthesia Checklist: Patient identified, Emergency Drugs available, Suction available and Patient being monitored Oxygen Delivery Method: Nasal cannula Placement Confirmation: positive ETCO2

## 2019-04-30 NOTE — Transfer of Care (Signed)
Immediate Anesthesia Transfer of Care Note  Patient: Joshua Hudson  Procedure(s) Performed: ESOPHAGOGASTRODUODENOSCOPY (EGD) WITH PROPOFOL (N/A ) SAVORY DILATION (N/A )  Patient Location: Endoscopy Unit  Anesthesia Type:MAC  Level of Consciousness: awake, alert  and oriented  Airway & Oxygen Therapy: Patient Spontanous Breathing and Patient connected to nasal cannula oxygen  Post-op Assessment: Report given to RN and Post -op Vital signs reviewed and stable  Post vital signs: Reviewed and stable  Last Vitals:  Vitals Value Taken Time  BP 135/61 04/30/19 0849  Temp 37.1 C 04/30/19 0840  Pulse 59 04/30/19 0849  Resp 20 04/30/19 0849  SpO2 99 % 04/30/19 0849  Vitals shown include unvalidated device data.  Last Pain:  Vitals:   04/30/19 0840  TempSrc: Temporal  PainSc: 0-No pain      Patients Stated Pain Goal: 2 (29/57/47 3403)  Complications: No apparent anesthesia complications

## 2019-04-30 NOTE — Progress Notes (Signed)
PROGRESS NOTE   Joshua Hudson  [id number]    DOB: 1935-01-18    DOA: 04/28/2019  PCP: Lavone Orn, MD   I have briefly reviewed patients previous medical records in [facility].  Chief Complaint  Patient presents with  . Weakness    Brief Narrative:  84 y.o.malemedical history significant for esophageal stricture s/p dilation May 2019, severe AS s/p TAVR 2 weeks ago, MR, HFpEF, RA on prednisone and MTX presented due to progressive weakness, fatigue, poor oral intake and difficulty swallowing even liquids for the past 2 weeks with associated weight loss.  [facility] consulted, s/p EGD and esophageal dilatation 8/26.   Assessment & Plan:   Active Problems:   S/P aortic valve replacement with bioprosthetic valve   Dysphagia   Mitral regurgitation   Hypertension   Rheumatoid arthritis (HCC)   GERD (gastroesophageal reflux disease)   FTT (failure to thrive) in adult   History of esophageal stricture   Odynophagia   Protein-calorie malnutrition, severe   Dysphagia/achalasia Barium swallow showed nonspecific esophageal dysmotility disorder [facility] consulted, EGD suspicious for achalasia and underwent dilatation 8/26. Clear liquid diet today and advance as tolerated.  Generalized weakness/severe malnutrition/adult failure to thrive Due to dysphagia related poor oral intake and weight loss. GI issue resolved as above. Hopefully should continue to improve as oral intake improves.  Acute kidney injury Resolved. Likely related to poor oral intake and diuretics.  Discontinued IV fluids.  Microcytic anemia/suspected anemia of chronic disease Baseline hemoglobin in the 7-8 range. No overt bleeding. Anemia panel: Iron 99, TIBC 160, saturation ratio 62, ferritin 1771, folate >100 and B12: 3284. Follow CBC daily and transfuse if hemoglobin 7 g or less. LDH mildly elevated.  Haptoglobin pending.?  Hemolysis related to TAVR.  Chronic diastolic HF/Hx of aortic stenosis  s/p TAVR/Hx of Afib s/p ablation TTE 8/20: LVEF 60-65%. Euvolemic.  Diuretics currently on hold, consider resuming Lasix 40 mg daily from tomorrow. Continue aspirin, Plavix and Toprol-XL.  Essential hypertension Controlled.  Continue Toprol-XL  Rheumatoid arthritis No acute flare. Continuing steroids, leflunomide and methotrexate.  Thrombocytopenia Stable.  May be related to RA medications.  GERD PPI  BPH Tamsulosin  Estimated body mass index is 15.59 kg/m as calculated from the following:   Height as of this encounter: 5\' 3"  (1.6 m).   Weight as of this encounter: 39.9 kg.    Nutritional Status Nutrition Problem: Severe Malnutrition Etiology: chronic illness, altered GI function(esophageal stricture; possible achalasia) Signs/Symptoms: energy intake < or equal to 75% for > or equal to 1 month, percent weight loss, per patient/family report Percent weight loss: 19.4 %(21.1 lb wt loss x 1 month) Interventions: Boost Breeze, MVI  DVT prophylaxis: SCDs Code Status: Full Family Communication: Discussed with patient's daughter at bedside, updated care and answered questions Disposition: DC home pending clinical improvement, possibly in the next 1 to 2 days   Consultants:  [facility]  Procedures:  EGD with esophageal dilatation of achalasia on 8/26  Antimicrobials:  None.   Subjective: Patient seen after procedure this afternoon.  Feels much better.  "I feel like a new man".  Dysphagia improved.  Tolerated liquid diet.  No pain reported.  Objective:  Vitals:   04/30/19 0900 04/30/19 0910 04/30/19 0936 04/30/19 1438  BP: (!) 146/103 (!) 142/67 134/77 115/67  Pulse: 61 63 63 (!) 56  Resp: 19 16  17   Temp:    98.1 F (36.7 C)  TempSrc:    Oral  SpO2: 99% 98% 100% 100%  Weight:      Height:        Examination:  General exam: Pleasant elderly male, moderately built and thinly nourished sitting up comfortably in chair. Respiratory system: Clear to  auscultation. Respiratory effort normal. Cardiovascular system: S1 & S2 heard, RRR. No JVD, murmurs, rubs, gallops or clicks.  Trace bilateral ankle edema.  Telemetry personally reviewed: Sinus rhythm. Gastrointestinal system: Abdomen is nondistended, soft and nontender. No organomegaly or masses felt. Normal bowel sounds heard. Central nervous system: Alert and oriented. No focal neurological deficits. Extremities: Symmetric 5 x 5 power. Skin: No rashes, lesions or ulcers Psychiatry: Judgement and insight appear normal. Mood & affect appropriate.     Data Reviewed: I have personally reviewed following labs and imaging studies  CBC: Recent Labs  Lab 04/28/19 1052 04/28/19 1108 04/29/19 0309 04/29/19 1036 04/30/19 0240  WBC 5.9  --  4.7 4.9 4.2  NEUTROABS 4.7  --   --  4.0 3.0  HGB 9.4* 11.2* 7.6* 8.4* 7.7*  HCT 29.6* 33.0* 23.6* 26.7* 23.9*  MCV 77.3*  --  76.6* 78.1* 76.8*  PLT 90*  --  72* 78* 82*   Basic Metabolic Panel: Recent Labs  Lab 04/28/19 1052 04/28/19 1108 04/29/19 0309 04/30/19 0240  NA 139 137 138 137  K 4.0 4.0 3.4* 3.5  CL 95* 98 101 102  CO2 30  --  26 28  GLUCOSE 104* 98 72 80  BUN 28* 34* 26* 19  CREATININE 1.52* 1.40* 1.22 0.89  CALCIUM 9.7  --  8.0* 8.4*   Liver Function Tests: Recent Labs  Lab 04/28/19 1052  AST 38  ALT 19  ALKPHOS 79  BILITOT 1.6*  PROT 6.4*  ALBUMIN 3.8    Cardiac Enzymes: No results for input(s): CKTOTAL, CKMB, CKMBINDEX, TROPONINI in the last 168 hours.  CBG: No results for input(s): GLUCAP in the last 168 hours.  Recent Results (from the past 240 hour(s))  SARS CORONAVIRUS 2 (TAT 6-12 HRS) Nasal Swab Aptima Multi Swab     Status: None   Collection Time: 04/28/19 12:29 PM   Specimen: Aptima Multi Swab; Nasal Swab  Result Value Ref Range Status   SARS Coronavirus 2 NEGATIVE NEGATIVE Final    Comment: (NOTE) SARS-CoV-2 target nucleic acids are NOT DETECTED. The SARS-CoV-2 RNA is generally detectable in upper  and lower respiratory specimens during the acute phase of infection. Negative results do not preclude SARS-CoV-2 infection, do not rule out co-infections with other pathogens, and should not be used as the sole basis for treatment or other patient management decisions. Negative results must be combined with clinical observations, patient history, and epidemiological information. The expected result is Negative. Fact Sheet for Patients: SugarRoll.be Fact Sheet for Healthcare Providers: https://www.woods-mathews.com/ This test is not yet approved or cleared by the [place] FDA and  has been authorized for detection and/or diagnosis of SARS-CoV-2 by FDA under an Emergency Use Authorization (EUA). This EUA will remain  in effect (meaning this test can be used) for the duration of the COVID-19 declaration under Section 56 4(b)(1) of the Act, 21 U.S.C. section 360bbb-3(b)(1), unless the authorization is terminated or revoked sooner. Performed at [facility], [place] [address], [place], [place] [address]          Radiology Studies: Dg Esophagus Dilation  Result Date: 04/30/2019 ESOPHAGEAL DILATATION: Fluoroscopy was provided for use by the requesting physician.  No images were obtained for radiographic interpretation.  Scheduled Meds: . aspirin EC  81 mg Oral Daily  . clopidogrel  75 mg Oral Q breakfast  . feeding supplement (ENSURE ENLIVE)  237 mL Oral TID BM  . folic acid  1 mg Oral Daily  . leflunomide  20 mg Oral Daily  . Melatonin  1 tablet Oral QHS  . metoprolol succinate  25 mg Oral Daily  . multivitamin  15 mL Oral Daily  . pantoprazole  40 mg Oral QAC breakfast  . predniSONE  5 mg Oral Q breakfast  . tamsulosin  0.8 mg Oral Daily  . vitamin B-12  1,000 mcg Oral Daily   Continuous Infusions: . sodium chloride 75 mL/hr at 04/30/19 0404     LOS: 2 days     Vernell Leep, MD, FACP, [facility]. [facility]  To contact the attending provider between 7A-7P or the covering provider during after hours 7P-7A, please log into the web site [email or web address] and access using universal [place] password for that web site. If you do not have the password, please call the hospital operator.  04/30/2019, 6:56 PM

## 2019-05-01 LAB — CBC
HCT: 24.4 % — ABNORMAL LOW (ref 39.0–52.0)
Hemoglobin: 7.7 g/dL — ABNORMAL LOW (ref 13.0–17.0)
MCH: 23.9 pg — ABNORMAL LOW (ref 26.0–34.0)
MCHC: 31.6 g/dL (ref 30.0–36.0)
MCV: 75.8 fL — ABNORMAL LOW (ref 80.0–100.0)
Platelets: 77 10*3/uL — ABNORMAL LOW (ref 150–400)
RBC: 3.22 MIL/uL — ABNORMAL LOW (ref 4.22–5.81)
RDW: 23.2 % — ABNORMAL HIGH (ref 11.5–15.5)
WBC: 3.4 10*3/uL — ABNORMAL LOW (ref 4.0–10.5)
nRBC: 0 % (ref 0.0–0.2)

## 2019-05-01 LAB — HAPTOGLOBIN: Haptoglobin: 65 mg/dL (ref 38–329)

## 2019-05-01 NOTE — Progress Notes (Signed)
PROGRESS NOTE   Joshua Hudson  [name]    DOB: Sep 08, 1934    DOA: 04/28/2019  PCP: Lavone Orn, MD   I have briefly reviewed patients previous medical records in [facility].  Chief Complaint  Patient presents with  . Weakness    Brief Narrative:  84 y.o.malemedical history significant for esophageal stricture s/p dilation May 2019, severe AS s/p TAVR 2 weeks ago, MR, HFpEF, RA on prednisone and MTX presented due to progressive weakness, fatigue, poor oral intake and difficulty swallowing even liquids for the past 2 weeks with associated weight loss.  [facility] consulted, s/p EGD and esophageal dilatation 8/26.  Tolerating liquid diet, advancing to soft diet today, monitor overnight and possible discharge home 8/28.   Assessment & Plan:   Active Problems:   S/P aortic valve replacement with bioprosthetic valve   Dysphagia   Mitral regurgitation   Hypertension   Rheumatoid arthritis (HCC)   GERD (gastroesophageal reflux disease)   FTT (failure to thrive) in adult   History of esophageal stricture   Odynophagia   Protein-calorie malnutrition, severe   Dysphagia/achalasia Barium swallow showed nonspecific esophageal dysmotility disorder [facility] consulted, EGD suspicious for achalasia and underwent dilatation 8/26. Tolerated liquid diet for the last 24 hours.  I discussed in detail with Dr. Oletta Lamas, GI who recommends advancing to dysphagia 1 diet today, monitor overnight and if he does well then discharge home tomorrow.  After discharge, patient is to follow-up with Dr. Therisa Doyne to consider Botox injection and if that does not work then referral to [facility] for specialized procedure.  Generalized weakness/severe malnutrition/adult failure to thrive Due to dysphagia related poor oral intake and weight loss. GI issue resolved as above. Hopefully should continue to improve as oral intake improves. Seen ambulating steadily in the hallway with supervision.   Acute kidney injury Resolved. Likely related to poor oral intake and diuretics.  Discontinued IV fluids.  Microcytic anemia/suspected anemia of chronic disease Baseline hemoglobin in the 7-8 range. No overt bleeding. Anemia panel: Iron 99, TIBC 160, saturation ratio 62, ferritin 1771, folate >100 and B12: 3284. Follow CBC daily and transfuse if hemoglobin 7 g or less. LDH mildly elevated.  Haptoglobin pending.?  Hemolysis related to TAVR. Hemoglobin stable in mid 7 g range.  Chronic diastolic HF/Hx of aortic stenosis s/p TAVR/Hx of Afib s/p ablation TTE 8/20: LVEF 60-65%. Euvolemic.  Diuretics currently on hold, consider resuming Lasix 40 mg daily at discharge. Continue aspirin, Plavix and Toprol-XL.  Essential hypertension Controlled.  Continue Toprol-XL  Rheumatoid arthritis No acute flare. Continuing steroids, leflunomide and methotrexate.  Question some of these medications contributing to bone marrow suppression and anemia/thrombocytopenia.  Thrombocytopenia Stable.  May be related to RA medications.  GERD PPI  BPH Tamsulosin  Estimated body mass index is 16.4 kg/m as calculated from the following:   Height as of this encounter: 5\' 3"  (1.6 m).   Weight as of this encounter: 42 kg.    Nutritional Status Nutrition Problem: Severe Malnutrition Etiology: chronic illness, altered GI function(esophageal stricture; possible achalasia) Signs/Symptoms: energy intake < or equal to 75% for > or equal to 1 month, percent weight loss, per patient/family report Percent weight loss: 19.4 %(21.1 lb wt loss x 1 month) Interventions: Boost Breeze, MVI  DVT prophylaxis: SCDs Code Status: Full Family Communication: Discussed with patient's daughter at bedside on 8/26, updated care and answered questions.  None at bedside today. Disposition: DC home pending clinical improvement, possibly 8/27  Consultants:  [facility]  Procedures:  EGD with esophageal dilatation of  achalasia on 8/26  Antimicrobials:  None.   Subjective: Patient interviewed with RN in room.  Feels good.  Tolerated liquid diet.  Wants to eat some solid food i.e. eggs.  No abdominal pain.  Had BM.  Ambulated hall steadily with supervision without dyspnea, chest pain, dizziness or lightheadedness.  Objective:  Vitals:   04/30/19 1438 04/30/19 2358 05/01/19 0609 05/01/19 0900  BP: 115/67 122/74 96/66 116/70  Pulse: (!) 56 62 61 63  Resp: 17 18 16    Temp: 98.1 F (36.7 C) 98.2 F (36.8 C) 97.9 F (36.6 C)   TempSrc: Oral  Oral   SpO2: 100% 100% 100%   Weight:   42 kg   Height:        Examination:  General exam: Pleasant elderly male, moderately built and thinly nourished seen ambulating steadily in the halls. Respiratory system: Clear to auscultation.  No increased work of breathing. Cardiovascular system: S1 and S2 heard, RRR.  No JVD, murmurs or pedal edema.  Telemetry personally reviewed: Sinus rhythm. Gastrointestinal system: Abdomen is nondistended, soft and nontender. No organomegaly or masses felt. Normal bowel sounds heard. Central nervous system: Alert and oriented. No focal neurological deficits. Extremities: Symmetric 5 x 5 power. Skin: No rashes, lesions or ulcers Psychiatry: Judgement and insight appear normal. Mood & affect appropriate.     Data Reviewed: I have personally reviewed following labs and imaging studies  CBC: Recent Labs  Lab 04/28/19 1052 04/28/19 1108 04/29/19 0309 04/29/19 1036 04/30/19 0240 05/01/19 0233  WBC 5.9  --  4.7 4.9 4.2 3.4*  NEUTROABS 4.7  --   --  4.0 3.0  --   HGB 9.4* 11.2* 7.6* 8.4* 7.7* 7.7*  HCT 29.6* 33.0* 23.6* 26.7* 23.9* 24.4*  MCV 77.3*  --  76.6* 78.1* 76.8* 75.8*  PLT 90*  --  72* 78* 82* 77*   Basic Metabolic Panel: Recent Labs  Lab 04/28/19 1052 04/28/19 1108 04/29/19 0309 04/30/19 0240  NA 139 137 138 137  K 4.0 4.0 3.4* 3.5  CL 95* 98 101 102  CO2 30  --  26 28  GLUCOSE 104* 98 72 80  BUN  28* 34* 26* 19  CREATININE 1.52* 1.40* 1.22 0.89  CALCIUM 9.7  --  8.0* 8.4*   Liver Function Tests: Recent Labs  Lab 04/28/19 1052  AST 38  ALT 19  ALKPHOS 79  BILITOT 1.6*  PROT 6.4*  ALBUMIN 3.8    Cardiac Enzymes: No results for input(s): CKTOTAL, CKMB, CKMBINDEX, TROPONINI in the last 168 hours.  CBG: No results for input(s): GLUCAP in the last 168 hours.  Recent Results (from the past 240 hour(s))  SARS CORONAVIRUS 2 (TAT 6-12 HRS) Nasal Swab Aptima Multi Swab     Status: None   Collection Time: 04/28/19 12:29 PM   Specimen: Aptima Multi Swab; Nasal Swab  Result Value Ref Range Status   SARS Coronavirus 2 NEGATIVE NEGATIVE Final    Comment: (NOTE) SARS-CoV-2 target nucleic acids are NOT DETECTED. The SARS-CoV-2 RNA is generally detectable in upper and lower respiratory specimens during the acute phase of infection. Negative results do not preclude SARS-CoV-2 infection, do not rule out co-infections with other pathogens, and should not be used as the sole basis for treatment or other patient management decisions. Negative results must be combined with clinical observations, patient history, and epidemiological information. The expected result is Negative. Fact Sheet for Patients:  SugarRoll.be Fact Sheet for Healthcare Providers: https://www.woods-mathews.com/ This test is not yet approved or cleared by the [place] FDA and  has been authorized for detection and/or diagnosis of SARS-CoV-2 by FDA under an Emergency Use Authorization (EUA). This EUA will remain  in effect (meaning this test can be used) for the duration of the COVID-19 declaration under Section 56 4(b)(1) of the Act, 21 U.S.C. section 360bbb-3(b)(1), unless the authorization is terminated or revoked sooner. Performed at [facility], [place] [address] [place], [place] [address]          Radiology Studies: Dg Esophagus Dilation  Result  Date: 04/30/2019 ESOPHAGEAL DILATATION: Fluoroscopy was provided for use by the requesting physician.  No images were obtained for radiographic interpretation.       Scheduled Meds: . aspirin EC  81 mg Oral Daily  . clopidogrel  75 mg Oral Q breakfast  . feeding supplement (ENSURE ENLIVE)  237 mL Oral TID BM  . folic acid  1 mg Oral Daily  . leflunomide  20 mg Oral Daily  . Melatonin  1 tablet Oral QHS  . metoprolol succinate  25 mg Oral Daily  . multivitamin  15 mL Oral Daily  . pantoprazole  40 mg Oral QAC breakfast  . predniSONE  5 mg Oral Q breakfast  . tamsulosin  0.8 mg Oral Daily  . vitamin B-12  1,000 mcg Oral Daily   Continuous Infusions:    LOS: 3 days     Vernell Leep, MD, FACP, [facility]. [facility]  To contact the attending provider between 7A-7P or the covering provider during after hours 7P-7A, please log into the web site [email or web address] and access using universal [place] password for that web site. If you do not have the password, please call the hospital operator.  05/01/2019, 1:11 PM

## 2019-05-01 NOTE — Progress Notes (Signed)
[facility] PROGRESS NOTE Subjective Patient reports that he is swallowing liquids better since his dilatation yesterday.  Have discussed with Dr. Therisa Doyne.  Patient's daughter is in the room.  Objective: Vital signs in last 24 hours: Temp:  [97.9 F (36.6 C)-98.2 F (36.8 C)] 97.9 F (36.6 C) (08/27 0609) Pulse Rate:  [56-63] 63 (08/27 0900) Resp:  [16-18] 16 (08/27 0609) BP: (96-122)/(66-74) 116/70 (08/27 0900) SpO2:  [100 %] 100 % (08/27 0609) Weight:  [42 kg] 42 kg (08/27 0609) Last BM Date: 04/30/19  Intake/Output from previous day: 08/26 0701 - 08/27 0700 In: 486.4 [P.O.:110; I.V.:276.4; IV Piggyback:100] Out: 550 [Urine:550] Intake/Output this shift: Total I/O In: 470 [P.O.:470] Out: -    Lab Results: Recent Labs    04/29/19 0309 04/29/19 1036 04/30/19 0240 05/01/19 0233  WBC 4.7 4.9 4.2 3.4*  HGB 7.6* 8.4* 7.7* 7.7*  HCT 23.6* 26.7* 23.9* 24.4*  PLT 72* 78* 82* 77*   BMET Recent Labs    04/29/19 0309 04/30/19 0240  NA 138 137  K 3.4* 3.5  CL 101 102  CO2 26 28  CREATININE 1.22 0.89   LFT No results for input(s): PROT, AST, ALT, ALKPHOS, BILITOT, BILIDIR, IBILI in the last 72 hours. PT/INR No results for input(s): LABPROT, INR in the last 72 hours. PANCREAS No results for input(s): LIPASE in the last 72 hours.       Studies/Results: Dg Esophagus Dilation  Result Date: 04/30/2019 ESOPHAGEAL DILATATION: Fluoroscopy was provided for use by the requesting physician.  No images were obtained for radiographic interpretation.   Medications: I have reviewed the patient's current medications.  Assessment:   1.  Dysphagia due to achalasia.  Patient was dilated with Savary yesterday.  Have discussed with he and daughter.  He is tolerating liquids we will go ahead and advance to dysphagia 1 which is pured foods.  Have gone over this with him extensively.   Plan: 1.  Advance to pured foods and if he tolerates he can go home tomorrow. 2.   Dr Therisa Doyne hold follow-up and schedule an outpatient EGD with Botox injection in the near future. 3.  If these do not work well for him going forward, we will refer to [facility] to consider endoscopic myotomy. 4.  Need to eat very soft foods and take with liquids in the vertical position discussed.   Nancy Fetter 05/01/2019, 1:30 PM  This note was created using voice recognition software. Minor errors may Have occurred unintentionally.  Pager: [phone] If no answer or after hours call [phone]

## 2019-05-01 NOTE — Progress Notes (Signed)
   04/30/19 2358  MEWS Score  Resp 18  Pulse Rate 62  BP 122/74  Temp 98.2 F (36.8 C)  SpO2 100 %  O2 Device Room Air  MEWS Score  MEWS RR 0  MEWS Pulse 0  MEWS Systolic 0  MEWS LOC 0  MEWS Temp 0  MEWS Score 0  MEWS Score Color Green  MEWS Assessment  Is this an acute change? No  MEWS Guidelines - (patients age 82 and over)  Red - At High Risk for Deterioration Yellow - At risk for Deterioration  1. Go to room and assess patient 2. Validate data. Is this patient's baseline? If data confirmed: 3. Is this an acute change? 4. Administer prn meds/treatments as ordered. 5. Note Sepsis score 6. Review goals of care 7. Sports coach, RRT nurse and Provider. 8. Ask Provider to come to bedside.  9. Document patient condition/interventions/response. 10. Increase frequency of vital signs and focused assessments to at least q15 minutes x 4, then q30 minutes x2. - If stable, then q1h x3, then q4h x3 and then q8h or dept. routine. - If unstable, contact Provider & RRT nurse. Prepare for possible transfer. 11. Add entry in progress notes using the smart phrase ".MEWS". 1. Go to room and assess patient 2. Validate data. Is this patient's baseline? If data confirmed: 3. Is this an acute change? 4. Administer prn meds/treatments as ordered? 5. Note Sepsis score 6. Review goals of care 7. Sports coach and Provider 8. Call RRT nurse as needed. 9. Document patient condition/interventions/response. 10. Increase frequency of vital signs and focused assessments to at least q2h x2. - If stable, then q4h x2 and then q8h or dept. routine. - If unstable, contact Provider & RRT nurse. Prepare for possible transfer. 11. Add entry in progress notes using the smart phrase ".MEWS".  Green - Likely stable Lavender - Comfort Care Only  1. Continue routine/ordered monitoring.  2. Review goals of care. 1. Continue routine/ordered monitoring. 2. Review goals of care.

## 2019-05-01 NOTE — Progress Notes (Signed)
Physical Therapy Treatment Patient Details Name: Joshua Hudson MRN: [id number] DOB: 07-03-1935 Today's Date: 05/01/2019    History of Present Illness Pt is an 84 y/o male admitted secondary to esophageal stricture vs achalasia. Per notes, likely for dilation on 8/26. Pt with Chronic R shoulder dislocation at baseline. PMH includes HTN, RA, and s/p AVR.     PT Comments    Pt is progressing well towards goals. He required supervision for all OOB activities. Today's skilled session focused on gait and stair training. Pt is expected to d/c today or tomorrow with HHPT to follow up.   Follow Up Recommendations  Home health PT;Supervision for mobility/OOB     Equipment Recommendations  None recommended by PT    Recommendations for Other Services       Precautions / Restrictions Precautions Precautions: Fall Restrictions Weight Bearing Restrictions: No    Mobility  Bed Mobility               General bed mobility comments: In chair upon entry  Transfers Overall transfer level: Needs assistance Equipment used: None Transfers: Sit to/from Stand Sit to Stand: Supervision         General transfer comment: supervision for safety  Ambulation/Gait Ambulation/Gait assistance: Supervision Gait Distance (Feet): 500 Feet Assistive device: None Gait Pattern/deviations: Step-through pattern;Decreased stride length;Drifts right/left   Gait velocity interpretation: >2.62 ft/sec, indicative of community ambulatory General Gait Details: Very mild drifting throughout gait. Velocity WNL. Supervision for safety   Stairs Stairs: Yes Stairs assistance: Supervision Stair Management: One rail Right;Alternating pattern;Forwards Number of Stairs: 3 General stair comments: Pt was able to negotiate steps with supervision for safety. Use of hand rail and reciprocal pattern   Wheelchair Mobility    Modified Rankin (Stroke Patients Only)       Balance Overall balance assessment:  Mild deficits observed, not formally tested                                          Cognition Arousal/Alertness: Awake/alert Behavior During Therapy: WFL for tasks assessed/performed Overall Cognitive Status: Within Functional Limits for tasks assessed                                        Exercises      General Comments        Pertinent Vitals/Pain Pain Assessment: No/denies pain    Home Living                      Prior Function            PT Goals (current goals can now be found in the care plan section) Acute Rehab PT Goals Patient Stated Goal: to be able to eat PT Goal Formulation: With patient Time For Goal Achievement: 05/13/19 Potential to Achieve Goals: Good Progress towards PT goals: Progressing toward goals    Frequency    Min 3X/week      PT Plan Current plan remains appropriate    Co-evaluation              AM-PAC PT "6 Clicks" Mobility   Outcome Measure  Help needed turning from your back to your side while in a flat bed without using bedrails?: None Help needed moving from lying on your back to  sitting on the side of a flat bed without using bedrails?: None Help needed moving to and from a bed to a chair (including a wheelchair)?: A Little Help needed standing up from a chair using your arms (e.g., wheelchair or bedside chair)?: A Little Help needed to walk in hospital room?: A Little Help needed climbing 3-5 steps with a railing? : A Little 6 Click Score: 20    End of Session Equipment Utilized During Treatment: Gait belt Activity Tolerance: Patient tolerated treatment well Patient left: in chair;with call bell/phone within reach Nurse Communication: Mobility status PT Visit Diagnosis: Unsteadiness on feet (R26.81);Muscle weakness (generalized) (M62.81)     Time: [id number] PT Time Calculation (min) (ACUTE ONLY): 9 min  Charges:  $Gait Training: 8-22 mins                      Benjiman Core, [place] Pager [phone] Acute Rehab   Allena Katz 05/01/2019, 12:20 PM

## 2019-05-02 ENCOUNTER — Encounter (HOSPITAL_COMMUNITY): Payer: Self-pay | Admitting: Gastroenterology

## 2019-05-02 ENCOUNTER — Other Ambulatory Visit: Payer: Self-pay | Admitting: *Deleted

## 2019-05-02 DIAGNOSIS — D638 Anemia in other chronic diseases classified elsewhere: Secondary | ICD-10-CM

## 2019-05-02 DIAGNOSIS — D61818 Other pancytopenia: Secondary | ICD-10-CM

## 2019-05-02 DIAGNOSIS — M069 Rheumatoid arthritis, unspecified: Secondary | ICD-10-CM

## 2019-05-02 LAB — CBC
HCT: 23.6 % — ABNORMAL LOW (ref 39.0–52.0)
Hemoglobin: 7.4 g/dL — ABNORMAL LOW (ref 13.0–17.0)
MCH: 24.6 pg — ABNORMAL LOW (ref 26.0–34.0)
MCHC: 31.4 g/dL (ref 30.0–36.0)
MCV: 78.4 fL — ABNORMAL LOW (ref 80.0–100.0)
Platelets: 54 10*3/uL — ABNORMAL LOW (ref 150–400)
RBC: 3.01 MIL/uL — ABNORMAL LOW (ref 4.22–5.81)
RDW: 23.9 % — ABNORMAL HIGH (ref 11.5–15.5)
WBC: 3 10*3/uL — ABNORMAL LOW (ref 4.0–10.5)
nRBC: 0 % (ref 0.0–0.2)

## 2019-05-02 MED ORDER — ENSURE ENLIVE PO LIQD: 237.0000 mL | Freq: Three times a day (TID) | ORAL | 12 refills | Status: DC

## 2019-05-02 NOTE — Progress Notes (Signed)
[name] to be D/C'd per MD order. Discussed with the patient and all questions fully answered. ? VSS, Skin clean, dry and intact without evidence of skin break down, no evidence of skin tears noted. ? IV catheter discontinued intact. Site without signs and symptoms of complications. Dressing and pressure applied. ? An After Visit Summary was printed and given to the patient. Patient informed where to pickup prescriptions. ? D/c education completed with patient/family including follow up instructions, medication list, d/c activities limitations if indicated, dysphagia 1 diet instructions, with other d/c instructions as indicated by MD - patient able to verbalize understanding, all questions fully answered.  ? Patient instructed to return to ED, call 911, or call MD for any changes in condition.  ? Patient to be escorted via [place], and D/C home via private auto.

## 2019-05-02 NOTE — Plan of Care (Signed)

## 2019-05-02 NOTE — Progress Notes (Signed)
[facility] PROGRESS NOTE Subjective Patient did well with pured diet.  He is likely going to be discharged later today.  Objective: Vital signs in last 24 hours: Temp:  [98 F (36.7 C)-98.8 F (37.1 C)] 98.6 F (37 C) (08/28 1442) Pulse Rate:  [66-72] 68 (08/28 1442) Resp:  [16-17] 17 (08/28 1442) BP: (117-126)/(66-78) 124/78 (08/28 1442) SpO2:  [98 %-100 %] 98 % (08/28 1442) Last BM Date: 05/01/19  Intake/Output from previous day: 08/27 0701 - 08/28 0700 In: 470 [P.O.:470] Out: -  Intake/Output this shift: No intake/output data recorded.    Lab Results: Recent Labs    04/30/19 0240 05/01/19 0233 05/02/19 0238  WBC 4.2 3.4* 3.0*  HGB 7.7* 7.7* 7.4*  HCT 23.9* 24.4* 23.6*  PLT 82* 77* 54*   BMET Recent Labs    04/30/19 0240  NA 137  K 3.5  CL 102  CO2 28  CREATININE 0.89   LFT No results for input(s): PROT, AST, ALT, ALKPHOS, BILITOT, BILIDIR, IBILI in the last 72 hours. PT/INR No results for input(s): LABPROT, INR in the last 72 hours. PANCREAS No results for input(s): LIPASE in the last 72 hours.       Studies/Results: No results found.  Medications: I have reviewed the patient's current medications.  Assessment:   1.  Dysphagia due to achalasia.  He seems to be doing better after Savary dilatation.   Plan: Would have him follow-up in 1 to 2 weeks with Dr. Therisa Doyne.  Depending on how he is going to do he will likely have Botox injection or evaluation in [facility] for endoscopic treatment.  Have discussed this with patient and his wife.   Nancy Fetter 05/02/2019, 3:25 PM  This note was created using voice recognition software. Minor errors may Have occurred unintentionally.  Pager: [phone] If no answer or after hours call [phone]

## 2019-05-02 NOTE — Discharge Summary (Signed)
Physician Discharge Summary  Joshua Hudson [id number] DOB: [birth year]  PCP: Lavone Orn, MD  Admitted from: Home Discharged to: Home  Admit date: 04/28/2019 Discharge date: 05/02/2019  Recommendations for Outpatient Follow-up:   Follow-up Information    Lavone Orn, MD. Schedule an appointment as soon as possible for a visit in 2 week(s).   Specialty: Internal Medicine Contact information: [address], [address] [place] [address] [phone]        Sueanne Margarita, MD .   Specialty: Cardiology Contact information: [phone] [address] [place] [address] [phone]        Nicholas Lose, MD. Schedule an appointment as soon as possible for a visit in 1 week(s).   Specialty: Hematology and Oncology Why: To be seen with repeat labs (CBC with differential & BMP).  Office will also call you with appointment.  Please call them back if you do not hear from them in 2-3 business days. Contact information: [place] [phone]        Ronnette Juniper, MD. Schedule an appointment as soon as possible for a visit.   Specialty: Gastroenterology Why: Office will call you with appointment.  Please call them back if you do not hear from them in 2-3 business days. Contact information: [place] [address] [phone]        Gavin Pound, MD. Schedule an appointment as soon as possible for a visit.   Specialty: Rheumatology Contact information: [place] [address] [phone]            Home Health: None Equipment/Devices: None  Discharge Condition: Improved and stable CODE STATUS: Full Diet recommendation: Dysphagia 1 diet and thin liquids.  Patient was provided with printed instructions.  Discharge Diagnoses:  Active Problems:   S/P aortic valve replacement with bioprosthetic valve   Dysphagia   Mitral regurgitation    Hypertension   Rheumatoid arthritis (HCC)   GERD (gastroesophageal reflux disease)   FTT (failure to thrive) in adult   History of esophageal stricture   Odynophagia   Protein-calorie malnutrition, severe   Brief Summary: 84 y.o.malemedical history significant for esophageal strictures/pdilation May 2019,severe ASs/pTAVR 2 weeks ago, MR, HFpEF, RA on prednisone, methotrexate and Arava presented due to progressive weakness, fatigue, poor oral intake and difficulty swallowing even liquids for the past 2 weeks with associated weight loss.  [facility] consulted, s/p EGD and esophageal dilatation 8/26.   Hematology was consulted for pancytopenia.   Assessment & Plan:   Dysphagia/achalasia Barium swallow showed nonspecific esophageal dysmotility disorder [facility] consulted, EGD suspicious for achalasia and underwent dilatation 8/26. Diet was gradually advanced to dysphagia 1 diet and thin liquids which she has tolerated. As per GI follow-up yesterday, outpatient follow-up with Dr. Therisa Doyne for EGD with Botox injection in the near future and if that does not work well for him then they will refer him to [facility] to consider endoscopy for myotomy. Patient has been instructed to eat very soft foods and take with liquids in the vertical position.  Generalized weakness/severe malnutrition/adult failure to thrive Due to dysphagia related poor oral intake and weight loss. GI issue resolved as above. Hopefully should continue to improve as oral intake improves. Seen ambulating steadily in the hallway with supervision.  Acute kidney injury Resolved. Likely related to poor oral intake and diuretics.  Discontinued IV fluids. Follow BMP closely as outpatient.  Microcytic anemia/suspected  anemia of chronic disease/pancytopenia Baseline hemoglobin in the 7-8 range. No overt bleeding. Anemia panel: Iron 99, TIBC 160, saturation ratio 62, ferritin 1771, folate >100 and B12: 3284. Follow CBC  daily and transfuse if hemoglobin 7 g or less. LDH mildly elevated.  Haptoglobin pending.?  Hemolysis related to TAVR. Today's labs show pancytopenia with hemoglobin of 7.4, WBC 3 and platelet count of 54.  His counts have been gradually dropping.  I discussed in detail with his rheumatologist Dr. Gavin Pound who advised that he had been referred to see hematologist in the past but apparently has not happened.  She recommended discontinuing methotrexate and Arava at this time although these were felt to be less likely cause for his pancytopenia and she recommended inpatient hematology consultation. I discussed and consulted with Dr. Lindi Adie, Hematology who saw the patient in the hospital.  He suspects thalassemia.  He has cleared him for discharge and will arrange close outpatient follow-up next week with repeat labs. Patient is asymptomatic of the anemia and no bleeding reported.  Chronic diastolic HF/Hx ofaortic stenosiss/pTAVR/Hx of Afib s/pablation TTE 8/20: LVEF 60-65%. Euvolemic.  Diuretics currently on hold, and prior home dose of Lasix was resumed at discharge. Continue aspirin, Plavix and Toprol-XL.  Essential hypertension Controlled.  Continue Toprol-XL  Rheumatoid arthritis No acute flare. Continuing steroids. As discussed in detail with Dr. Trudie Reed, his Rheumatologist, Methotrexate and Arava discontinued at discharge.  Outpatient follow-up with her.  GERD PPI  BPH Tamsulosin  Estimated body mass index is 16.4 kg/m as calculated from the following:   Height as of this encounter: 5\' 3"  (1.6 m).   Weight as of this encounter: 42 kg.    Nutritional Status Nutrition Problem: Severe Malnutrition Etiology: chronic illness, altered GI function(esophageal stricture; possible achalasia) Signs/Symptoms: energy intake < or equal to 75% for > or equal to 1 month, percent weight loss, per patient/family report Percent weight loss: 19.4 %(21.1 lb wt loss x 1  month) Interventions: Boost Breeze, MVI   Consultants:  [facility] Hematology  Procedures:  EGD with esophageal dilatation of achalasia on 8/26   Discharge Instructions  Discharge Instructions    (HEART FAILURE PATIENTS) Call MD:  Anytime you have any of the following symptoms: 1) 3 pound weight gain in 24 hours or 5 pounds in 1 week 2) shortness of breath, with or without a dry hacking cough 3) swelling in the hands, feet or stomach 4) if you have to sleep on extra pillows at night in order to breathe.   Complete by: As directed    Call MD for:   Complete by: As directed    Recurrent swallowing difficulties.   Call MD for:  difficulty breathing, headache or visual disturbances   Complete by: As directed    Call MD for:  extreme fatigue   Complete by: As directed    Call MD for:  persistant dizziness or light-headedness   Complete by: As directed    Call MD for:  persistant nausea and vomiting   Complete by: As directed    Call MD for:  severe uncontrolled pain   Complete by: As directed    Call MD for:  temperature >100.4   Complete by: As directed    Diet - low sodium heart healthy   Complete by: As directed    Dysphagia 1 consistency and thin liquids.   Increase activity slowly   Complete by: As directed        Medication List  STOP taking these medications   leflunomide 20 MG tablet Commonly known as: ARAVA   methotrexate 2.5 MG tablet Commonly known as: RHEUMATREX     TAKE these medications   acetaminophen 500 MG tablet Commonly known as: TYLENOL Take 1,000 mg by mouth every 6 (six) hours as needed for moderate pain or headache.   aspirin 81 MG EC tablet Take 1 tablet (81 mg total) by mouth daily.   cholecalciferol 25 MCG (1000 UT) tablet Commonly known as: VITAMIN D3 Take 1,000 Units by mouth daily.   clopidogrel 75 MG tablet Commonly known as: PLAVIX Take 1 tablet (75 mg total) by mouth daily with breakfast.   feeding supplement  (ENSURE ENLIVE) Liqd Take 237 mLs by mouth 3 (three) times daily between meals.   folic acid 1 MG tablet Commonly known as: FOLVITE Take 1 mg by mouth daily.   furosemide 40 MG tablet Commonly known as: LASIX Take 40 mg daily.   MEGARED OMEGA-3 KRILL OIL PO Take 750 mg by mouth daily.   Melatonin 3 MG Tabs Take 1 tablet by mouth at bedtime.   metoprolol succinate 25 MG 24 hr tablet Commonly known as: TOPROL-XL Take 1 tablet by mouth once daily   multivitamin with minerals Tabs tablet Take 1 tablet by mouth daily.   pantoprazole 40 MG tablet Commonly known as: PROTONIX Take 40 mg by mouth daily before breakfast.   potassium chloride SA 20 MEQ tablet Commonly known as: K-DUR Take 20 meq daily   predniSONE 5 MG tablet Commonly known as: DELTASONE Take 5 mg by mouth daily with breakfast.   tamsulosin 0.4 MG Caps capsule Commonly known as: FLOMAX Take 0.8 mg by mouth daily.   Theratears 0.25 % Soln Generic drug: Carboxymethylcellulose Sodium Place 1 drop into both eyes 3 (three) times daily as needed (dry/irritated eyes.).   Turmeric 500 MG Caps Take 500 mg by mouth daily.   vitamin B-12 1000 MCG tablet Commonly known as: CYANOCOBALAMIN Take 1,000 mcg by mouth daily.      No Known Allergies    Procedures/Studies: Dg Chest Port 1 View  Result Date: 04/28/2019 CLINICAL DATA:  Shortness of breath EXAM: PORTABLE CHEST 1 VIEW COMPARISON:  April 15, 2019 FINDINGS: There is no edema or consolidation. Heart size and pulmonary vascularity are normal. No adenopathy. Patient is status post aortic valve replacement. There is aortic atherosclerosis. There is a total shoulder replacement on right with apparent anterior dislocation on right, stable. IMPRESSION: No edema or consolidation. Heart size normal. Status post aortic valve replacement. Chronic anterior dislocation right shoulder with postoperative change in this area. Aortic Atherosclerosis (ICD10-I70.0).  Electronically Signed   By: Lowella Grip III M.D.   On: 04/28/2019 11:17   Dg Esophagus Dilation  Result Date: 04/30/2019 ESOPHAGEAL DILATATION: Fluoroscopy was provided for use by the requesting physician.  No images were obtained for radiographic interpretation.  Dg Esophagus W Single Cm (sol Or Thin Ba)  Result Date: 04/28/2019 CLINICAL DATA:  Weakness, failure to thrive, weight loss. Difficulty drinking and eating for the last 2 days. Persistent throat clearing. EXAM: ESOPHOGRAM/BARIUM SWALLOW TECHNIQUE: Single contrast examination was performed using  thin barium. FLUOROSCOPY TIME:  Fluoroscopy Time:  2 minutes, 0 seconds Radiation Exposure Index (if provided by the fluoroscopic device): 11.7 mGy Number of Acquired Spot Images: 0 COMPARISON:  Chest CT from 04/07/2019 and 03/10/2019 FINDINGS: Because of the patient's frailty and clinical situation, the entire exam was performed using thin barium with the patient in the LPO  position. Various observations include aortic stent and dislocated right glenohumeral joint with prosthetic right humerus. There is disruption of primary peristaltic waves in the upper esophagus on various swallows compatible with esophageal dysmotility. Slight prominence of the esophageal impression on the esophagus but without a discrete mass in this vicinity. Because of the dysmotility, there is prominence of the upper thoracic esophagus and the patient's throat clearing suggests that this dysmotility may be causing backing up of secretions and barium prompting Mr. Manny to repeatedly clear his throat. Due to secondary and tertiary contractions as well as the underlying dysmotility, it is difficult to exclude narrowing of the distal most esophagus for example on image 10/8, because contrast with slowly percolated from the esophagus into the stomach instead of briskly passing on 3 peristalsis. Accordingly, I cannot completely exclude a smooth stricture of the distal esophagus.  The most I was able to distend this up to was about 5 mm (image 10/8). I considered administering a barium pill, but given the dysmotility I am very skeptical that the barium pill would successfully make its way down to the distal esophagus due challenge the region in a realistic timeframe. IMPRESSION: 1. Nonspecific esophageal dysmotility disorder. 2. Maximum distention of the distal most esophagus was about 5 mm. This may well simply be due to dysmotility but I cannot exclude a smooth stricture. 3. 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 4. The patient repeatedly cleared his throat before, during, and after the examination, suggesting possible backing up of secretions into the pharynx/hypopharynx region. This may well be secondary to the dysmotility. I did not observe a definite high-grade obstruction to fluids, but rather generalized poor transit due to dysmotility. Electronically Signed   By: Van Clines M.D.   On: 04/28/2019 16:28      Subjective: Patient is eager to discharge.  He denies complaints.  Tolerated modified diet without nausea, vomiting, difficulty swallowing.  No chest pain, dyspnea, dizziness or lightheadedness even with activity.  As per RN, no acute issues noted.  Discharge Exam:  Vitals:   05/01/19 1528 05/01/19 2155 05/02/19 0551 05/02/19 1442  BP: 126/74 117/66 122/74 124/78  Pulse:  72 66 68  Resp:  16 16 17   Temp: 98 F (36.7 C) 98.8 F (37.1 C) 98.2 F (36.8 C) 98.6 F (37 C)  TempSrc: Oral Oral Oral Oral  SpO2: 100% 100% 98% 98%  Weight:      Height:        General exam: Pleasant elderly male, moderately built and thinly nourished  sitting up comfortably in chair this morning. Respiratory system: Clear to auscultation.  No increased work of breathing. Cardiovascular system: S1 and S2 heard, RRR.  No JVD, murmurs or pedal edema.  Gastrointestinal system: Abdomen is nondistended, soft and nontender. No organomegaly or masses felt. Normal  bowel sounds heard. Central nervous system: Alert and oriented. No focal neurological deficits. Extremities: Symmetric 5 x 5 power. Skin: No rashes, lesions or ulcers Psychiatry: Judgement and insight appear normal. Mood & affect appropriate.     The results of significant diagnostics from this hospitalization (including imaging, microbiology, ancillary and laboratory) are listed below for reference.     Microbiology: Recent Results (from the past 240 hour(s))  SARS CORONAVIRUS 2 (TAT 6-12 HRS) Nasal Swab Aptima Multi Swab     Status: None   Collection Time: 04/28/19 12:29 PM   Specimen: Aptima Multi Swab; Nasal Swab  Result Value Ref Range Status   SARS Coronavirus 2 NEGATIVE NEGATIVE Final  Comment: (NOTE) SARS-CoV-2 target nucleic acids are NOT DETECTED. The SARS-CoV-2 RNA is generally detectable in upper and lower respiratory specimens during the acute phase of infection. Negative results do not preclude SARS-CoV-2 infection, do not rule out co-infections with other pathogens, and should not be used as the sole basis for treatment or other patient management decisions. Negative results must be combined with clinical observations, patient history, and epidemiological information. The expected result is Negative. Fact Sheet for Patients: SugarRoll.be Fact Sheet for Healthcare Providers: https://www.woods-mathews.com/ This test is not yet approved or cleared by the [place] FDA and  has been authorized for detection and/or diagnosis of SARS-CoV-2 by FDA under an Emergency Use Authorization (EUA). This EUA will remain  in effect (meaning this test can be used) for the duration of the COVID-19 declaration under Section 56 4(b)(1) of the Act, 21 U.S.C. section 360bbb-3(b)(1), unless the authorization is terminated or revoked sooner. Performed at [facility], [place] [address], [place], [place] [address]       Labs: CBC: Recent Labs  Lab 04/28/19 1052  04/29/19 0309 04/29/19 1036 04/30/19 0240 05/01/19 0233 05/02/19 0238  WBC 5.9  --  4.7 4.9 4.2 3.4* 3.0*  NEUTROABS 4.7  --   --  4.0 3.0  --   --   HGB 9.4*   < > 7.6* 8.4* 7.7* 7.7* 7.4*  HCT 29.6*   < > 23.6* 26.7* 23.9* 24.4* 23.6*  MCV 77.3*  --  76.6* 78.1* 76.8* 75.8* 78.4*  PLT 90*  --  72* 78* 82* 77* 54*   < > = values in this interval not displayed.   Basic Metabolic Panel: Recent Labs  Lab 04/28/19 1052 04/28/19 1108 04/29/19 0309 04/30/19 0240  NA 139 137 138 137  K 4.0 4.0 3.4* 3.5  CL 95* 98 101 102  CO2 30  --  26 28  GLUCOSE 104* 98 72 80  BUN 28* 34* 26* 19  CREATININE 1.52* 1.40* 1.22 0.89  CALCIUM 9.7  --  8.0* 8.4*   Liver Function Tests: Recent Labs  Lab 04/28/19 1052  AST 38  ALT 19  ALKPHOS 79  BILITOT 1.6*  PROT 6.4*  ALBUMIN 3.8   BNP (last 3 results) Recent Labs    04/11/19 1344 04/28/19 1052  BNP 834.7* 332.0*   Urinalysis    Component Value Date/Time   COLORURINE YELLOW 04/28/2019 2116   APPEARANCEUR CLEAR 04/28/2019 2116   LABSPEC 1.017 04/28/2019 2116   PHURINE 5.0 04/28/2019 2116   [place] 04/28/2019 2116   HGBUR NEGATIVE 04/28/2019 2116   BILIRUBINUR NEGATIVE 04/28/2019 2116   KETONESUR 20 (A) 04/28/2019 2116   PROTEINUR 30 (A) 04/28/2019 2116   UROBILINOGEN 0.2 01/14/2008 0530   NITRITE NEGATIVE 04/28/2019 2116   LEUKOCYTESUR NEGATIVE 04/28/2019 2116   Discussed in detail with patient's daughter, updated care and answered questions.   Time coordinating discharge: 40 minutes  SIGNED:  Vernell Leep, MD, FACP, [facility] [address]. [facility]  To contact the attending provider between 7A-7P or the covering provider during after hours 7P-7A, please log into the web site [email or web address] and access using universal [place] password for that web site. If you do not have the password, please call the hospital operator.

## 2019-05-02 NOTE — Consult Note (Signed)
[name]  Patient Care Team: Lavone Orn, MD as PCP - General (Internal Medicine) Sueanne Margarita, MD as PCP - Cardiology (Cardiology) Sueanne Margarita, MD as Consulting Physician (Cardiology)  CHIEF COMPLAINTS/PURPOSE OF CONSULTATION:  Pancytopenia  HISTORY OF PRESENTING ILLNESS:  Joshua Hudson 84 y.o. male is admitted to the hospital because of esophageal stricture status post dilatation.  He has a prior history of chronic medical problems including rheumatoid arthritis on prednisone and methotrexate.  His methotrexate is currently on hold.  He was also having profound generalized weakness and was brought into the hospital for the weight loss.  He is eating much better today. He tells me that he has had chronic anemia for a long time and has received multiple units of blood transfusions.  Every time he undergoes surgery his blood counts go down and he requires blood transfusion. On review of his blood work it appears that he has been pancytopenic at least since the past 1 year.  At times his platelet counts recover and at other times they are fallen.  Today's a.m. CBC reveals a hemoglobin of 7.4 platelets 54 and WBC of 3.  LDH is mildly elevated but haptoglobin is normal.  Iron studies, R48 and folic acid are all normal.  I reviewed her records extensively and collaborated the history with the patient.  MEDICAL HISTORY:  Past Medical History:  Diagnosis Date  . Aortic insufficiency 03/24/2019   AI of bioprosthetic AVR  . Chronic diastolic CHF (congestive heart failure) ([place]) 03/24/2019  . Colon polyps    s/p diverticular perforation requiring 2-stage repair  . Diverticulosis   . DJD (degenerative joint disease), lumbosacral   . Esophageal stricture   . GERD (gastroesophageal reflux disease)   . Hemolytic anemia ([place])   . History of blood transfusion    patient states "years ago"  . History of kidney stones    passed stones  . Hypertension   .  Mitral regurgitation    moderate  . Occipital neuralgia   . Postoperative atrial fibrillation ([place]) 05/10/2015  . Prosthetic valve dysfunction   . Rheumatoid arthritis ([place])    s/o long term steroids  shoulders and hands  . Rotator cuff arthropathy    right  . S/P aortic valve replacement with bioprosthetic valve 01/16/2008   51m Edwards Magna perimount bovine pericardial tissue valve, model 3000  . S/P valve-in-valve TAVR 04/15/2019   26 mm Edwards Sapien 3 Ultra transcatheter heart valve placed via percutaneous right transfemoral approach   . SBE (subacute bacterial endocarditis) prophylaxis candidate    for dental procedures  . Severe aortic stenosis    S/P prosthetic valve replacement w 25 mm Edwards like science percardial tissue valve,[place] - 01/2008    SURGICAL HISTORY: Past Surgical History:  Procedure Laterality Date  . APPENDECTOMY    . BOTOX INJECTION N/A 01/22/2018   Procedure: BOTOX INJECTION;  Surgeon: KRonnette Juniper MD;  Location: [facility];  Service: Gastroenterology;  Laterality: N/A;  . CARDIAC CATHETERIZATION     09  . CARDIAC VALVE REPLACEMENT  01/2008   aortic valve replacement  . CATARACT EXTRACTION W/ INTRAOCULAR LENS  IMPLANT, BILATERAL    . COLON RESECTION     diverticulitis   . dental implants     permanent  . ESOPHAGEAL MANOMETRY N/A 11/07/2017   Procedure: ESOPHAGEAL MANOMETRY (EM);  Surgeon: KRonnette Juniper MD;  Location: [facility];  Service: Gastroenterology;  Laterality: N/A;  . ESOPHAGOGASTRODUODENOSCOPY (EGD) WITH PROPOFOL N/A  01/22/2018   Procedure: ESOPHAGOGASTRODUODENOSCOPY (EGD) WITH PROPOFOL;  Surgeon: Ronnette Juniper, MD;  Location: [facility];  Service: Gastroenterology;  Laterality: N/A;  . ESOPHAGOGASTRODUODENOSCOPY (EGD) WITH PROPOFOL N/A 04/30/2019   Procedure: ESOPHAGOGASTRODUODENOSCOPY (EGD) WITH PROPOFOL;  Surgeon: Laurence Spates, MD;  Location: [place];  Service: Endoscopy;  Laterality: N/A;  . EXCISIONAL TOTAL SHOULDER ARTHROPLASTY  WITH ANTIBIOTIC SPACER Right 12/31/2018   Procedure: EXCISIONAL TOTAL SHOULDER ARTHROPLASTY WITH ANTIBIOTIC SPACER;  Surgeon: Netta Cedars, MD;  Location: [facility];  Service: Orthopedics;  Laterality: Right;  . HERNIA REPAIR    . INTRAOPERATIVE TRANSTHORACIC ECHOCARDIOGRAM N/A 04/15/2019   Procedure: Intraoperative Transthoracic Echocardiogram;  Surgeon: Sherren Mocha, MD;  Location: [place];  Service: Open Heart Surgery;  Laterality: N/A;  . IRRIGATION AND DEBRIDEMENT SHOULDER Right 11/20/2018    IRRIGATION AND DEBRIDEMENT SHOULDER WITH POLY EXCHANGE (Right Shoulder)  . IRRIGATION AND DEBRIDEMENT SHOULDER Right 11/20/2018   Procedure: IRRIGATION AND DEBRIDEMENT SHOULDER WITH POLY EXCHANGE;  Surgeon: Netta Cedars, MD;  Location: [facility];  Service: Orthopedics;  Laterality: Right;  . LUMBAR LAMINECTOMY     x 2  . REVERSE SHOULDER ARTHROPLASTY Right 03/01/2018   Procedure: RIGHT REVERSE SHOULDER ARTHROPLASTY;  Surgeon: Netta Cedars, MD;  Location: [place];  Service: Orthopedics;  Laterality: Right;  . RIGHT/LEFT HEART CATH AND CORONARY ANGIOGRAPHY N/A 04/02/2019   Procedure: RIGHT/LEFT HEART CATH AND CORONARY ANGIOGRAPHY;  Surgeon: Leonie Man, MD;  Location: [facility] [facility];  Service: Cardiovascular;  Laterality: N/A;  . SAVORY DILATION N/A 01/22/2018   Procedure: SAVORY DILATION;  Surgeon: Ronnette Juniper, MD;  [facility];  Service: Gastroenterology;  Laterality: N/A;  . SAVORY DILATION N/A 04/30/2019   Procedure: SAVORY DILATION;  Surgeon: Laurence Spates, MD;  Location: [facility];  Service: Endoscopy;  Laterality: N/A;  With fluro  . SHOULDER HEMI-ARTHROPLASTY Right 06/14/2018   Procedure: RIGHT  REVERSE TOTAL SHOULDER OPEN POLY EXCHANGE;  Surgeon: Netta Cedars, MD;  Location: [facility];  Service: Orthopedics;  Laterality: Right;  . TEE WITHOUT CARDIOVERSION N/A 01/29/2014   Procedure: TRANSESOPHAGEAL ECHOCARDIOGRAM (TEE);  Surgeon: Sueanne Margarita, MD;  Location: [facility] ENDOSCOPY;  Service:  Cardiovascular;  Laterality: N/A;  . TEE WITHOUT CARDIOVERSION N/A 04/02/2019   Procedure: TRANSESOPHAGEAL ECHOCARDIOGRAM (TEE);  Surgeon: Josue Hector, MD;  Location: [facility] ENDOSCOPY;  Service: Cardiovascular;  Laterality: N/A;  . TONSILLECTOMY    . TRANSCATHETER AORTIC VALVE REPLACEMENT, TRANSFEMORAL  04/15/2019  . TRANSCATHETER AORTIC VALVE REPLACEMENT, TRANSFEMORAL N/A 04/15/2019   Procedure: TRANSCATHETER AORTIC VALVE REPLACEMENT, TRANSFEMORAL with POST BALLOON DILATION;  Surgeon: Sherren Mocha, MD;  Location: [place];  Service: Open Heart Surgery;  Laterality: N/A;    SOCIAL HISTORY: Social History   Socioeconomic History  . Marital status: Married    Spouse name: Not on file  . Number of children: 2  . Years of education: Not on file  . Highest education level: Not on file  Occupational History  . Occupation: Retired Market researcher at [employer]  . Financial resource strain: Not on file  . Food insecurity    Worry: Not on file    Inability: Not on file  . Transportation needs    Medical: Not on file    Non-medical: Not on file  Tobacco Use  . Smoking status: Former Smoker    Packs/day: 0.50    Years: 10.00    Pack years: 5.00    Types: Cigarettes    Start date: 01/16/1974    Quit date: 09/05/1983  Years since quitting: 35.6  . Smokeless tobacco: Former Systems developer    Types: Chew  Substance and Sexual Activity  . Alcohol use: Not Currently    Alcohol/week: 0.0 standard drinks  . Drug use: No  . Sexual activity: Not on file  Lifestyle  . Physical activity    Days per week: Not on file    Minutes per session: Not on file  . Stress: Not on file  Relationships  . Social Herbalist on phone: Not on file    Gets together: Not on file    Attends religious service: Not on file    Active member of club or organization: Not on file    Attends meetings of clubs or organizations: Not on file    Relationship status: Not on file  . Intimate partner violence    Fear of  current or ex partner: Not on file    Emotionally abused: Not on file    Physically abused: Not on file    Forced sexual activity: Not on file  Other Topics Concern  . Not on file  Social History Narrative  . Not on file    FAMILY HISTORY: Family History  Problem Relation Age of Onset  . Other Mother        NO HEALTH PROBLEMS  . Heart disease Father   . Arthritis Father   . Heart Problems Sister        RELATED TO A MVA  . Suicidality Brother   . Other Sister        [place]  . Other Brother        2 [place]  . Other Daughter        2 IN GOOD HEALTH    ALLERGIES:  has No Known Allergies.  MEDICATIONS:  Current Facility-Administered Medications  Medication Dose Route Frequency Provider Last Rate Last Dose  . acetaminophen-codeine (TYLENOL #3) 300-30 MG per tablet 1-2 tablet  1-2 tablet Oral Q6H PRN Laurence Spates, MD   1 tablet at 04/28/19 2228  . aspirin EC tablet 81 mg  81 mg Oral Daily Laurence Spates, MD   81 mg at 05/02/19 0956  . clopidogrel (PLAVIX) tablet 75 mg  75 mg Oral Q breakfast Laurence Spates, MD   75 mg at 05/02/19 0849  . feeding supplement (ENSURE ENLIVE) (ENSURE ENLIVE) liquid 237 mL  237 mL Oral TID BM Laurence Spates, MD   237 mL at 05/02/19 1314  . folic acid (FOLVITE) tablet 1 mg  1 mg Oral Daily Laurence Spates, MD   1 mg at 05/02/19 0956  . Melatonin TABS 3 mg  1 tablet Oral Madolyn Frieze, MD   3 mg at 05/01/19 2243  . metoprolol succinate (TOPROL-XL) 24 hr tablet 25 mg  25 mg Oral Daily Laurence Spates, MD   25 mg at 05/02/19 0956  . multivitamin liquid 15 mL  15 mL Oral Daily Laurence Spates, MD   15 mL at 05/02/19 0956  . pantoprazole (PROTONIX) EC tablet 40 mg  40 mg Oral QAC breakfast Laurence Spates, MD   40 mg at 05/02/19 0849  . polyvinyl alcohol (LIQUIFILM TEARS) 1.4 % ophthalmic solution 1 drop  1 drop Both Eyes TID PRN Laurence Spates, MD   1 drop at 05/01/19 0929  . predniSONE (DELTASONE) tablet 5 mg  5 mg  Oral Q breakfast Laurence Spates, MD   5 mg at 05/02/19 0849  . tamsulosin (  FLOMAX) capsule 0.8 mg  0.8 mg Oral Daily Laurence Spates, MD   0.8 mg at 05/02/19 0956  . vitamin B-12 (CYANOCOBALAMIN) tablet 1,000 mcg  1,000 mcg Oral Daily Laurence Spates, MD   1,000 mcg at 05/02/19 4103    REVIEW OF SYSTEMS:   Constitutional: Generalized fatigue weakness and weight loss probably due to esophageal stricture Eyes: Denies blurriness of vision, double vision or watery eyes Ears, nose, mouth, throat, and face: Denies mucositis or sore throat Respiratory: Denies cough, dyspnea or wheezes Cardiovascular: Denies palpitation, chest discomfort or lower extremity swelling Gastrointestinal:  Denies nausea, heartburn or change in bowel habits Skin: Denies abnormal skin rashes Lymphatics: Denies new lymphadenopathy or easy bruising Neurological:Denies numbness, tingling or new weaknesses Behavioral/Psych: Mood is stable, no new changes   All other systems were reviewed with the patient and are negative.  PHYSICAL EXAMINATION: ECOG PERFORMANCE STATUS: 2 - Symptomatic, <50% confined to bed  Vitals:   05/02/19 0551 05/02/19 1442  BP: 122/74 124/78  Pulse: 66 68  Resp: 16 17  Temp: 98.2 F (36.8 C) 98.6 F (37 C)  SpO2: 98% 98%   Filed Weights   04/28/19 1025 04/30/19 0715 05/01/19 0609  Weight: 88 lb (39.9 kg) 88 lb (39.9 kg) 92 lb 9.5 oz (42 kg)    GENERAL:alert, no distress and comfortable SKIN: skin color, texture, turgor are normal, no rashes or significant lesions EYES: normal, conjunctiva are pink and non-injected, sclera clear OROPHARYNX:no exudate, no erythema and lips, buccal mucosa, and tongue normal  NECK: supple, thyroid normal size, non-tender, without nodularity LYMPH:  no palpable lymphadenopathy in the cervical, axillary or inguinal LUNGS: clear to auscultation and percussion with normal breathing effort HEART: regular rate & rhythm and no murmurs and no lower extremity  edema ABDOMEN:abdomen soft, non-tender and normal bowel sounds Musculoskeletal:no cyanosis of digits and no clubbing  PSYCH: alert & oriented x 3 with fluent speech NEURO: no focal motor/sensory deficits   LABORATORY DATA:  I have reviewed the data as listed Lab Results  Component Value Date   WBC 3.0 (L) 05/02/2019   HGB 7.4 (L) 05/02/2019   HCT 23.6 (L) 05/02/2019   MCV 78.4 (L) 05/02/2019   PLT 54 (L) 05/02/2019   Lab Results  Component Value Date   NA 137 04/30/2019   K 3.5 04/30/2019   CL 102 04/30/2019   CO2 28 04/30/2019    RADIOGRAPHIC STUDIES: I have personally reviewed the radiological reports and agreed with the findings in the report.  ASSESSMENT AND PLAN:  1.  Pancytopenia: WBC count 3, hemoglobin 7.4, platelet count 54, MCV 76.8 Previous differentials were normal Platelet count declined from 70-80 K I discussed with him that his platelet counts have been decreasing for the past 1 year. It could be related to methotrexate.  Methotrexate is currently on hold. I will recheck his blood counts in 1 week in my office and if they are low he will undergo a bone marrow biopsy.  2. He probably has thalassemia based upon microcytosis. We will check for hemoglobin electrophoresis along with erythropoietin levels and reticulocyte count when he comes to our office.   All questions were answered. The patient knows to call the clinic with any problems, questions or concerns.    [name], MD _0 @

## 2019-05-02 NOTE — Progress Notes (Signed)
   05/01/19 2155  MEWS Score  Resp 16  Pulse Rate 72  BP 117/66  Temp 98.8 F (37.1 C)  SpO2 100 %  O2 Device Room Air  MEWS Score  MEWS RR 0  MEWS Pulse 0  MEWS Systolic 0  MEWS LOC 0  MEWS Temp 0  MEWS Score 0  MEWS Score Color Green  MEWS Assessment  Is this an acute change? No  MEWS Guidelines - (patients age 82 and over)  Red - At High Risk for Deterioration Yellow - At risk for Deterioration  1. Go to room and assess patient 2. Validate data. Is this patient's baseline? If data confirmed: 3. Is this an acute change? 4. Administer prn meds/treatments as ordered. 5. Note Sepsis score 6. Review goals of care 7. Sports coach, RRT nurse and Provider. 8. Ask Provider to come to bedside.  9. Document patient condition/interventions/response. 10. Increase frequency of vital signs and focused assessments to at least q15 minutes x 4, then q30 minutes x2. - If stable, then q1h x3, then q4h x3 and then q8h or dept. routine. - If unstable, contact Provider & RRT nurse. Prepare for possible transfer. 11. Add entry in progress notes using the smart phrase ".MEWS". 1. Go to room and assess patient 2. Validate data. Is this patient's baseline? If data confirmed: 3. Is this an acute change? 4. Administer prn meds/treatments as ordered? 5. Note Sepsis score 6. Review goals of care 7. Sports coach and Provider 8. Call RRT nurse as needed. 9. Document patient condition/interventions/response. 10. Increase frequency of vital signs and focused assessments to at least q2h x2. - If stable, then q4h x2 and then q8h or dept. routine. - If unstable, contact Provider & RRT nurse. Prepare for possible transfer. 11. Add entry in progress notes using the smart phrase ".MEWS".  Green - Likely stable [place] - Comfort Care Only  1. Continue routine/ordered monitoring.  2. Review goals of care. 1. Continue routine/ordered monitoring. 2. Review goals of care.

## 2019-05-05 ENCOUNTER — Telehealth: Payer: Self-pay | Admitting: Hematology and Oncology

## 2019-05-05 NOTE — Progress Notes (Signed)
[facility] NOTE  Patient Care Team: Lavone Orn, MD as PCP - General (Internal Medicine) Sueanne Margarita, MD as PCP - Cardiology (Cardiology) Sueanne Margarita, MD as Consulting Physician (Cardiology)  CHIEF COMPLAINTS/PURPOSE OF CONSULTATION:  History of pancytopenia  HISTORY OF PRESENTING ILLNESS:  Joshua Hudson 84 y.o. male is here because of recent diagnosis of pancytopenia. He was hospitalized from 8/24-8/28 for esophageal strictureand was found to be pancytopenic. Labs showed: Hg 7.4, platelets 54,000, WBC 3.0. He presents to the clinic today for a recheck of his labs and discussion of treatment options.  He was taking methotrexate previously which has been held.  He feels extremely short of breath and very weak to exertion.  I reviewed her records extensively and collaborated the history with the patient.  MEDICAL HISTORY:  Past Medical History:  Diagnosis Date  . Aortic insufficiency 03/24/2019   AI of bioprosthetic AVR  . Chronic diastolic CHF (congestive heart failure) ([place]) 03/24/2019  . Colon polyps    s/p diverticular perforation requiring 2-stage repair  . Diverticulosis   . DJD (degenerative joint disease), lumbosacral   . Esophageal stricture   . GERD (gastroesophageal reflux disease)   . Hemolytic anemia ([place])   . History of blood transfusion    patient states "years ago"  . History of kidney stones    passed stones  . Hypertension   . Mitral regurgitation    moderate  . Occipital neuralgia   . Postoperative atrial fibrillation ([place]) 05/10/2015  . Prosthetic valve dysfunction   . Rheumatoid arthritis ([place])    s/o long term steroids  shoulders and hands  . Rotator cuff arthropathy    right  . S/P aortic valve replacement with bioprosthetic valve 01/16/2008   20m Edwards Magna perimount bovine pericardial tissue valve, model 3000  . S/P valve-in-valve TAVR 04/15/2019   26 mm Edwards Sapien 3 Ultra transcatheter heart valve placed  via percutaneous right transfemoral approach   . SBE (subacute bacterial endocarditis) prophylaxis candidate    for dental procedures  . Severe aortic stenosis    S/P prosthetic valve replacement w 25 mm Edwards like science percardial tissue valve,[place] - 01/2008    SURGICAL HISTORY: Past Surgical History:  Procedure Laterality Date  . APPENDECTOMY    . BOTOX INJECTION N/A 01/22/2018   Procedure: BOTOX INJECTION;  Surgeon: KRonnette Juniper MD;  Location: [facility];  Service: Gastroenterology;  Laterality: N/A;  . CARDIAC CATHETERIZATION     09  . CARDIAC VALVE REPLACEMENT  01/2008   aortic valve replacement  . CATARACT EXTRACTION W/ INTRAOCULAR LENS  IMPLANT, BILATERAL    . COLON RESECTION     diverticulitis   . dental implants     permanent  . ESOPHAGEAL MANOMETRY N/A 11/07/2017   Procedure: ESOPHAGEAL MANOMETRY (EM);  Surgeon: KRonnette Juniper MD;  Location: [facility];  Service: Gastroenterology;  Laterality: N/A;  . ESOPHAGOGASTRODUODENOSCOPY (EGD) WITH PROPOFOL N/A 01/22/2018   Procedure: ESOPHAGOGASTRODUODENOSCOPY (EGD) WITH PROPOFOL;  Surgeon: KRonnette Juniper MD;  Location: [facility];  Service: Gastroenterology;  Laterality: N/A;  . ESOPHAGOGASTRODUODENOSCOPY (EGD) WITH PROPOFOL N/A 04/30/2019   Procedure: ESOPHAGOGASTRODUODENOSCOPY (EGD) WITH PROPOFOL;  Surgeon: ELaurence Spates MD;  Location[place] [facility]  Service: Endoscopy;  Laterality: N/A;  . EXCISIONAL TOTAL SHOULDER ARTHROPLASTY WITH ANTIBIOTIC SPACER Right 12/31/2018   Procedure: EXCISIONAL TOTAL SHOULDER ARTHROPLASTY WITH ANTIBIOTIC SPACER;  Surgeon: NNetta Cedars MD;  [place] [facility]  Service: Orthopedics;  Laterality: Right;  . HERNIA REPAIR    .  INTRAOPERATIVE TRANSTHORACIC ECHOCARDIOGRAM N/A 04/15/2019   Procedure: Intraoperative Transthoracic Echocardiogram;  Surgeon: Sherren Mocha, MD;  Location: [facility];  Service: Open Heart Surgery;  Laterality: N/A;  . IRRIGATION AND DEBRIDEMENT SHOULDER Right 11/20/2018     IRRIGATION AND DEBRIDEMENT SHOULDER WITH POLY EXCHANGE (Right Shoulder)  . IRRIGATION AND DEBRIDEMENT SHOULDER Right 11/20/2018   Procedure: IRRIGATION AND DEBRIDEMENT SHOULDER WITH POLY EXCHANGE;  Surgeon: Netta Cedars, MD;  Location: [facility];  Service: Orthopedics;  Laterality: Right;  . LUMBAR LAMINECTOMY     x 2  . REVERSE SHOULDER ARTHROPLASTY Right 03/01/2018   Procedure: RIGHT REVERSE SHOULDER ARTHROPLASTY;  Surgeon: Netta Cedars, MD;  Location: [facility];  Service: Orthopedics;  Laterality: Right;  . RIGHT/LEFT HEART CATH AND CORONARY ANGIOGRAPHY N/A 04/02/2019   Procedure: RIGHT/LEFT HEART CATH AND CORONARY ANGIOGRAPHY;  Surgeon: Leonie Man, MD;  Location: [facility];  Service: Cardiovascular;  Laterality: N/A;  . SAVORY DILATION N/A 01/22/2018   Procedure: SAVORY DILATION;  Surgeon: Ronnette Juniper, MD;  [facility];  Service: Gastroenterology;  Laterality: N/A;  . SAVORY DILATION N/A 04/30/2019   Procedure: SAVORY DILATION;  Surgeon: Laurence Spates, MD;  Location: [facility];  Service: Endoscopy;  Laterality: N/A;  With fluro  . SHOULDER HEMI-ARTHROPLASTY Right 06/14/2018   Procedure: RIGHT  REVERSE TOTAL SHOULDER OPEN POLY EXCHANGE;  Surgeon: Netta Cedars, MD;  Location: [facility];  Service: Orthopedics;  Laterality: Right;  . TEE WITHOUT CARDIOVERSION N/A 01/29/2014   Procedure: TRANSESOPHAGEAL ECHOCARDIOGRAM (TEE);  Surgeon: Sueanne Margarita, MD;  Location[facility];  Service: Cardiovascular;  Laterality: N/A;  . TEE WITHOUT CARDIOVERSION N/A 04/02/2019   Procedure: TRANSESOPHAGEAL ECHOCARDIOGRAM (TEE);  Surgeon: Josue Hector, MD;  Location[facility];  Service: Cardiovascular;  Laterality: N/A;  . TONSILLECTOMY    . TRANSCATHETER AORTIC VALVE REPLACEMENT, TRANSFEMORAL  04/15/2019  . TRANSCATHETER AORTIC VALVE REPLACEMENT, TRANSFEMORAL N/A 04/15/2019   Procedure: TRANSCATHETER AORTIC VALVE REPLACEMENT, TRANSFEMORAL with POST BALLOON DILATION;  Surgeon: Sherren Mocha, MD;  Location: [facility];  Service: Open Heart Surgery;  Laterality: N/A;    SOCIAL HISTORY: Social History   Socioeconomic History  . Marital status: Married    Spouse name: Not on file  . Number of children: 2  . Years of education: Not on file  . Highest education level: Not on file  Occupational History  . Occupation: Retired Market researcher at [employer]  . Financial resource strain: Not on file  . Food insecurity    Worry: Not on file    Inability: Not on file  . Transportation needs    Medical: Not on file    Non-medical: Not on file  Tobacco Use  . Smoking status: Former Smoker    Packs/day: 0.50    Years: 10.00    Pack years: 5.00    Types: Cigarettes    Start date: 01/16/1974    Quit date: 09/05/1983    Years since quitting: 35.6  . Smokeless tobacco: Former Systems developer    Types: Chew  Substance and Sexual Activity  . Alcohol use: Not Currently    Alcohol/week: 0.0 standard drinks  . Drug use: No  . Sexual activity: Not on file  Lifestyle  . Physical activity    Days per week: Not on file    Minutes per session: Not on file  . Stress: Not on file  Relationships  . Social Herbalist on phone: Not on file    Gets together: Not  on file    Attends religious service: Not on file    Active member of club or organization: Not on file    Attends meetings of clubs or organizations: Not on file    Relationship status: Not on file  . Intimate partner violence    Fear of current or ex partner: Not on file    Emotionally abused: Not on file    Physically abused: Not on file    Forced sexual activity: Not on file  Other Topics Concern  . Not on file  Social History Narrative  . Not on file    FAMILY HISTORY: Family History  Problem Relation Age of Onset  . Other Mother        NO HEALTH PROBLEMS  . Heart disease Father   . Arthritis Father   . Heart Problems Sister        RELATED TO A MVA  . Suicidality Brother   . Other Sister        [place]  . Other Brother        2 Donald  . Other Daughter        2 IN GOOD HEALTH    ALLERGIES:  has No Known Allergies.  MEDICATIONS:  Current Outpatient Medications  Medication Sig Dispense Refill  . acetaminophen (TYLENOL) 500 MG tablet Take 1,000 mg by mouth every 6 (six) hours as needed for moderate pain or headache.    Marland Kitchen aspirin EC 81 MG EC tablet Take 1 tablet (81 mg total) by mouth daily. 30 tablet   . cholecalciferol (VITAMIN D3) 25 MCG (1000 UT) tablet Take 1,000 Units by mouth daily.    . clopidogrel (PLAVIX) 75 MG tablet Take 1 tablet (75 mg total) by mouth daily with breakfast. 90 tablet 1  . feeding supplement, ENSURE ENLIVE, (ENSURE ENLIVE) LIQD Take 237 mLs by mouth 3 (three) times daily between meals. 401 mL 12  . folic acid (FOLVITE) 1 MG tablet Take 1 mg by mouth daily.    . furosemide (LASIX) 40 MG tablet Take 40 mg daily. 90 tablet 3  . MEGARED OMEGA-3 KRILL OIL PO Take 750 mg by mouth daily.     . Melatonin 3 MG TABS Take 1 tablet by mouth at bedtime.    . metoprolol succinate (TOPROL-XL) 25 MG 24 hr tablet Take 1 tablet by mouth once daily (Patient taking differently: Take 25 mg by mouth daily. ) 90 tablet 1  . Multiple Vitamin (MULTIVITAMIN WITH MINERALS) TABS tablet Take 1 tablet by mouth daily.    . pantoprazole (PROTONIX) 40 MG tablet Take 40 mg by mouth daily before breakfast.     . potassium chloride SA (K-DUR) 20 MEQ tablet Take 20 meq daily 90 tablet 3  . predniSONE (DELTASONE) 5 MG tablet Take 5 mg by mouth daily with breakfast.     . tamsulosin (FLOMAX) 0.4 MG CAPS capsule Take 0.8 mg by mouth daily.    . THERATEARS 0.25 % SOLN Place 1 drop into both eyes 3 (three) times daily as needed (dry/irritated eyes.).    Marland Kitchen Turmeric 500 MG CAPS Take 500 mg by mouth daily.    . vitamin B-12 (CYANOCOBALAMIN) 1000 MCG tablet Take 1,000 mcg by mouth daily.     No current facility-administered medications for this visit.     REVIEW OF SYSTEMS:    Constitutional: Denies fevers, chills or abnormal night sweats Eyes: Denies blurriness of vision, double vision or  watery eyes Ears, nose, mouth, throat, and face: Denies mucositis or sore throat Respiratory: Denies cough, dyspnea or wheezes Cardiovascular: Denies palpitation, chest discomfort or lower extremity swelling Gastrointestinal:  Denies nausea, heartburn or change in bowel habits Skin: Denies abnormal skin rashes Lymphatics: Denies new lymphadenopathy or easy bruising Neurological:Denies numbness, tingling or new weaknesses Behavioral/Psych: Mood is stable, no new changes  All other systems were reviewed with the patient and are negative.  PHYSICAL EXAMINATION: ECOG PERFORMANCE STATUS: 1 - Symptomatic but completely ambulatory  Vitals:   05/06/19 1253  BP: (!) 98/43  Pulse: 72  Resp: 16  Temp: 98.9 F (37.2 C)  SpO2: 100%   Filed Weights   05/06/19 1253  Weight: 104 lb 4.8 oz (47.3 kg)    GENERAL:alert, no distress and comfortable SKIN: skin color, texture, turgor are normal, no rashes or significant lesions EYES: normal, conjunctiva are pink and non-injected, sclera clear OROPHARYNX:no exudate, no erythema and lips, buccal mucosa, and tongue normal  NECK: supple, thyroid normal size, non-tender, without nodularity LYMPH:  no palpable lymphadenopathy in the cervical, axillary or inguinal LUNGS: clear to auscultation and percussion with normal breathing effort HEART: regular rate & rhythm and no murmurs and no lower extremity edema ABDOMEN:abdomen soft, non-tender and normal bowel sounds Musculoskeletal:no cyanosis of digits and no clubbing  PSYCH: alert & oriented x 3 with fluent speech NEURO: no focal motor/sensory deficits  LABORATORY DATA:  I have reviewed the data as listed Lab Results  Component Value Date   WBC 3.0 (L) 05/02/2019   HGB 7.4 (L) 05/02/2019   HCT 23.6 (L) 05/02/2019   MCV 78.4 (L) 05/02/2019   PLT 54 (L) 05/02/2019   Lab Results   Component Value Date   NA 137 04/30/2019   K 3.5 04/30/2019   CL 102 04/30/2019   CO2 28 04/30/2019    RADIOGRAPHIC STUDIES: I have personally reviewed the radiological reports and agreed with the findings in the report.  ASSESSMENT AND PLAN:  Other pancytopenia ([place]) Microcytic anemia: Longstanding mild anemia was present previously from 2015.  His anemia has gotten worse since June 12, 2018.  In March 2020 his hemoglobin was 6.7 and required blood transfusion.  Regarding the microcytosis we will obtain hemoglobin electrophoresis to rule out thalassemia. In the hospital work-up: Ferritin 2951, folic acid greater than 100, B12 3284, LDH 264  Thrombocytopenia: Started somewhere around July 2020.  His platelet levels have fluctuated up and down and most recently have been between 70-80 K. Today's CBC: Hemoglobin 7.6, platelets 99 Platelet count has improved after coming off methotrexate. For the anemia we will plan to give 1 unit of PRBC.  Possible differential: Myelodysplastic syndrome versus methotrexate related toxicity. His methotrexate has been on hold.   If his cytopenias persist we will have to consider doing a bone marrow biopsy. Return to clinic in 2 weeks with labs and follow-up.  All questions were answered. The patient knows to call the clinic with any problems, questions or concerns.   [name], MD 05/06/2019    I, Molly Dorshimer, am acting as scribe for Nicholas Lose, MD.  I have reviewed the above documentation for accuracy and completeness, and I agree with the above.

## 2019-05-05 NOTE — Telephone Encounter (Signed)
Received a staff msg to schedule a hospital follow up appt. Pt has been cld and scheduled to see Dr. Lindi Adie tomorrow, 9/1 at 1pm.

## 2019-05-06 ENCOUNTER — Other Ambulatory Visit: Payer: Self-pay

## 2019-05-06 ENCOUNTER — Telehealth: Payer: Self-pay | Admitting: Hematology and Oncology

## 2019-05-06 ENCOUNTER — Inpatient Hospital Stay: Payer: Medicare Other | Attending: Hematology and Oncology | Admitting: Hematology and Oncology

## 2019-05-06 ENCOUNTER — Inpatient Hospital Stay: Payer: Medicare Other

## 2019-05-06 DIAGNOSIS — R0602 Shortness of breath: Secondary | ICD-10-CM

## 2019-05-06 DIAGNOSIS — D61818 Other pancytopenia: Secondary | ICD-10-CM | POA: Diagnosis present

## 2019-05-06 DIAGNOSIS — D563 Thalassemia minor: Secondary | ICD-10-CM | POA: Insufficient documentation

## 2019-05-06 DIAGNOSIS — R531 Weakness: Secondary | ICD-10-CM

## 2019-05-06 DIAGNOSIS — D649 Anemia, unspecified: Secondary | ICD-10-CM

## 2019-05-06 DIAGNOSIS — R718 Other abnormality of red blood cells: Secondary | ICD-10-CM | POA: Diagnosis not present

## 2019-05-06 DIAGNOSIS — D638 Anemia in other chronic diseases classified elsewhere: Secondary | ICD-10-CM

## 2019-05-06 LAB — CMP (CANCER CENTER ONLY)
ALT: 25 U/L (ref 0–44)
AST: 34 U/L (ref 15–41)
Albumin: 3.3 g/dL — ABNORMAL LOW (ref 3.5–5.0)
Alkaline Phosphatase: 77 U/L (ref 38–126)
Anion gap: 6 (ref 5–15)
BUN: 20 mg/dL (ref 8–23)
CO2: 26 mmol/L (ref 22–32)
Calcium: 9.1 mg/dL (ref 8.9–10.3)
Chloride: 107 mmol/L (ref 98–111)
Creatinine: 0.87 mg/dL (ref 0.61–1.24)
GFR, Est AFR Am: >60 mL/min (ref >60)
GFR, Estimated: >60 mL/min (ref >60)
Glucose, Bld: 110 mg/dL — ABNORMAL HIGH (ref 70–99)
Potassium: 4.6 mmol/L (ref 3.5–5.1)
Sodium: 139 mmol/L (ref 135–145)
Total Bilirubin: 0.6 mg/dL (ref 0.3–1.2)
Total Protein: 5.9 g/dL — ABNORMAL LOW (ref 6.5–8.1)

## 2019-05-06 LAB — CBC WITH DIFFERENTIAL (CANCER CENTER ONLY)
Abs Immature Granulocytes: 0.04 10*3/uL (ref 0.00–0.07)
Basophils Absolute: 0 10*3/uL (ref 0.0–0.1)
Basophils Relative: 1 %
Eosinophils Absolute: 0.2 10*3/uL (ref 0.0–0.5)
Eosinophils Relative: 3 %
HCT: 24.1 % — ABNORMAL LOW (ref 39.0–52.0)
Hemoglobin: 7.6 g/dL — ABNORMAL LOW (ref 13.0–17.0)
Immature Granulocytes: 1 %
Lymphocytes Relative: 6 %
Lymphs Abs: 0.4 10*3/uL — ABNORMAL LOW (ref 0.7–4.0)
MCH: 24.2 pg — ABNORMAL LOW (ref 26.0–34.0)
MCHC: 31.5 g/dL (ref 30.0–36.0)
MCV: 76.8 fL — ABNORMAL LOW (ref 80.0–100.0)
Monocytes Absolute: 0.9 10*3/uL (ref 0.1–1.0)
Monocytes Relative: 14 %
Neutro Abs: 5 10*3/uL (ref 1.7–7.7)
Neutrophils Relative %: 75 %
Platelet Count: 99 10*3/uL — ABNORMAL LOW (ref 150–400)
RBC: 3.14 MIL/uL — ABNORMAL LOW (ref 4.22–5.81)
RDW: 24.5 % — ABNORMAL HIGH (ref 11.5–15.5)
WBC Count: 6.6 10*3/uL (ref 4.0–10.5)
nRBC: 0.5 % — ABNORMAL HIGH (ref 0.0–0.2)

## 2019-05-06 LAB — RETICULOCYTES
Immature Retic Fract: 34.4 % — ABNORMAL HIGH (ref 2.3–15.9)
RBC.: 3.12 MIL/uL — ABNORMAL LOW (ref 4.22–5.81)
Retic Count, Absolute: 52.4 10*3/uL (ref 19.0–186.0)
Retic Ct Pct: 1.7 % (ref 0.4–3.1)

## 2019-05-06 LAB — SAMPLE TO BLOOD BANK

## 2019-05-06 LAB — PREPARE RBC (CROSSMATCH)

## 2019-05-06 LAB — ABO/RH: ABO/RH(D): A POS

## 2019-05-06 NOTE — Telephone Encounter (Signed)
I talk with patient regarding 9/15

## 2019-05-06 NOTE — Assessment & Plan Note (Signed)
Microcytic anemia: Longstanding mild anemia was present previously from 2015.  His anemia has gotten worse since June 12, 2018.  In March 2020 his hemoglobin was 6.7 and required blood transfusion.  Regarding the microcytosis we will obtain hemoglobin electrophoresis.  Thrombocytopenia: Started somewhere around July 2020.  His platelet levels have fluctuated up and down and most recently have been between 70-80 K.  Possible differential: Myelodysplastic syndrome versus methotrexate related toxicity. His methotrexate has been on hold.  If his cytopenias persist we will have to consider doing a bone marrow biopsy.

## 2019-05-07 ENCOUNTER — Inpatient Hospital Stay: Payer: Medicare Other

## 2019-05-07 ENCOUNTER — Other Ambulatory Visit: Payer: Self-pay

## 2019-05-07 DIAGNOSIS — D649 Anemia, unspecified: Secondary | ICD-10-CM

## 2019-05-07 DIAGNOSIS — D61818 Other pancytopenia: Secondary | ICD-10-CM | POA: Diagnosis not present

## 2019-05-07 LAB — HEMOGLOBINOPATHY EVALUATION
Hgb A2 Quant: 4.5 % — ABNORMAL HIGH (ref 1.8–3.2)
Hgb A: 94.9 % — ABNORMAL LOW (ref 96.4–98.8)
Hgb C: 0 %
Hgb F Quant: 0.6 % (ref 0.0–2.0)
Hgb S Quant: 0 %
Hgb Variant: 0 %

## 2019-05-07 MED ORDER — SODIUM CHLORIDE 0.9% IV SOLUTION
250.0000 mL | Freq: Once | INTRAVENOUS | Status: AC
  Administered 2019-05-07: 08:00:00 250 mL via INTRAVENOUS
  Filled 2019-05-07: qty 250

## 2019-05-07 MED ORDER — ACETAMINOPHEN 325 MG PO TABS
650.0000 mg | ORAL_TABLET | Freq: Once | ORAL | Status: AC
  Administered 2019-05-07: 650 mg via ORAL

## 2019-05-07 MED ORDER — ACETAMINOPHEN 325 MG PO TABS
ORAL_TABLET | ORAL | Status: AC
  Filled 2019-05-07: qty 2

## 2019-05-07 MED ORDER — SODIUM CHLORIDE 0.9% FLUSH
3.0000 mL | INTRAVENOUS | Status: DC | PRN
  Filled 2019-05-07: qty 10

## 2019-05-07 MED ORDER — DIPHENHYDRAMINE HCL 25 MG PO CAPS
25.0000 mg | ORAL_CAPSULE | Freq: Once | ORAL | Status: AC
  Administered 2019-05-07: 08:00:00 25 mg via ORAL

## 2019-05-07 MED ORDER — DIPHENHYDRAMINE HCL 25 MG PO CAPS
ORAL_CAPSULE | ORAL | Status: AC
  Filled 2019-05-07: qty 1

## 2019-05-07 NOTE — Patient Instructions (Signed)

## 2019-05-08 LAB — TYPE AND SCREEN
ABO/RH(D): A POS
Antibody Screen: NEGATIVE
Unit division: 0

## 2019-05-08 LAB — BPAM RBC
Blood Product Expiration Date: 202009252359
ISSUE DATE / TIME: 202009020832
Unit Type and Rh: 6200

## 2019-05-13 NOTE — Progress Notes (Signed)
[facility]   MULTIDISCIPLINARY HEART VALVE TEAM   Virtual Visit via Telephone Note   This visit type was conducted due to national recommendations for restrictions regarding the COVID-19 Pandemic (e.g. social distancing) in an effort to limit this patient's exposure and mitigate transmission in our community.  Due to his co-morbid illnesses, this patient is at least at moderate risk for complications without adequate follow up.  This format is felt to be most appropriate for this patient at this time.  The patient did not have access to video technology/had technical difficulties with video requiring transitioning to audio format only (telephone).  All issues noted in this document were discussed and addressed.  No physical exam could be performed with this format.  Please refer to the patient's chart for his  consent to telehealth for [facility].   Evaluation Performed:  Follow-up visit  Date:  05/15/2019   ID:  Joshua Hudson, Joshua Hudson [birth year], MRN [id number]  Patient Location: Home Provider Location: Office  PCP:  Lavone Orn, MD  Cardiologist:  [name], MD / Dr. Burt Knack & Dr. Roxy Manns (TAVR)  Chief Complaint:  1 month s/p TAVR   History of Present Illness:    Joshua Hudson is a 84 y.o. male with a history of severe AS s/p AVR with a 43mm Edwards bioprosthetic valve in 2009 with post op afib (withno recurrence), HTN, rheumatoid arthritis on MTX, leflunomide and prednisone, achalasia, mod MR,andchronic diastolic CHF, hemolytic anemia, bioprosthetic valve failure with severe AI s/p valve-in-valve TAVR (04/15/19) who presents for follow up.   Patient underwent aortic valve replacement using a 25 mm Edwards magna stented bovine pericardial tissue valve by Dr. Servando Snare in 2009 for severe symptomatic aortic stenosis. His early postoperative recovery was notable for postoperative atrial fibrillation which has not recurred. He has done well from a cardiac standpoint  and been followed carefully by Dr. Radford Pax. Echocardiogram performed in January of this year revealed normal left ventricular systolic function and mild aortic insufficiency.Over the past 2 to 3 months the patient has developed relatively acute onset of shortness of breath, chest discomfort, orthopnea, and lower extremity edema. Initially symptoms were primarily with physical exertion but symptoms have progressed fairly rapidly such that now the patient gets short of breath with minimal activity and occasionally at rest.  He has had problems with his right shoulder and underwent right shoulder replacement more than 1 year ago. He has had multiple problems ever since with an attempt at revision of his shoulder arthroplasty which subsequently became infected, requiring removal of the hardware and placement of temporary plastic hardware. He has been evaluated for redo shoulder replacement in the future.He was seen by Havery Moros in the office on 03/18/2019 for preoperative clearance for redo shoulder arthroplasty. He was noted to have significant lower extremity edema as well as other signs and symptoms of acute heart failure.Follow-up TTE on 03/18/2019 revealed normal left ventricular systolic function with at least moderate AI. Subsequent TEE on 04/02/2019 confirmed the presence of severe prosthetic valve dysfunction with severe AI. There was no vegetation on the aortic valve she suggest endocarditis. Cardiac catheterization on 04/02/2019 showed normal coronaries. He was also noted to have worsening anemia. Haptoglobin was low c/w hemolytic anemia.   He underwent successfulvalve in valveTAVR with a42mm Edwards Sapien UltraTHV via the TF approach on 04/15/19. Post operative echo showed EF 60-65%, normally functioning TAVR with mean gradient of 14 mmHg and no AI. He was transfused 2 U PRBCs for hemolytic  anemia felt to be related to his severe AI. He also developed post op thrombocytopenia with  platelets down to 46K. He was discharged on POD1 on aspirin and plavix.  At post surgical follow up he was feeling poorly. He had continued shortness of breath and fatigue. He had not been sleeping well and still with significant LE edema. Follow up lab work showed stable hg ~9 and platelets improved to 74,000. Creat and electrolytes were okay but NT pro BNP was elevated ~1500. His diuretics were adjusted from Lasix 40mg  daily to 40mg  BID x 3 days.   The patient continued to do poorly with back/shoulder pain, weakness, dysphagia and failure to thrive with progressive weight loss (10 lbs over 1 week) and presented to the ER on 8/24. He was readmitted 8/24-8/28/20 and diagnosed with dysphagia 2/2 achalasia and underwent esophageal dilation. He also had AKI and worsening anemia. Anemia panel: Iron 99, TIBC 160, saturation ratio 62, ferritin 1771, folate >100 and B12: 3284. Repeat Haptoglobin was normal but LDH was mildly elevated. Labs also showed pancytopenia with Hg 7.4, WBC 3, PLT 54.Dr. Lindi Adie with hematology saw the patient and suspected thalassemia. MTX and Arava was discontinued at discharge. Outpatient hematology was arranged.  Today he presents for follow up. He is doing okay. Still feels very weak with significant shortness of breath. He was seen by hematology last week and transfused a unit of blood. He had return of LE edema since leaving the hospital. No orthopnea or PND. No dizziness or syncope.  He has never had improvement in his shortness of breath despite diuresis and blood transfusion.  He has a lot of insomnia and often cannot fall asleep at night due to his mind racing.   Past Medical History:  Diagnosis Date  . Aortic insufficiency 03/24/2019   AI of bioprosthetic AVR  . Chronic diastolic CHF (congestive heart failure) ([place]) 03/24/2019  . Colon polyps    s/p diverticular perforation requiring 2-stage repair  . Diverticulosis   . DJD (degenerative joint disease), lumbosacral   .  Esophageal stricture   . GERD (gastroesophageal reflux disease)   . Hemolytic anemia ([place])   . History of blood transfusion    patient states "years ago"  . History of kidney stones    passed stones  . Hypertension   . Mitral regurgitation    moderate  . Occipital neuralgia   . Postoperative atrial fibrillation ([place]) 05/10/2015  . Prosthetic valve dysfunction   . Rheumatoid arthritis ([place])    s/o long term steroids  shoulders and hands  . Rotator cuff arthropathy    right  . S/P aortic valve replacement with bioprosthetic valve 01/16/2008   56mm Edwards Magna perimount bovine pericardial tissue valve, model 3000  . S/P valve-in-valve TAVR 04/15/2019   26 mm Edwards Sapien 3 Ultra transcatheter heart valve placed via percutaneous right transfemoral approach   . SBE (subacute bacterial endocarditis) prophylaxis candidate    for dental procedures  . Severe aortic stenosis    S/P prosthetic valve replacement w 25 mm Edwards like science percardial tissue valve,[place] - 01/2008   Past Surgical History:  Procedure Laterality Date  . APPENDECTOMY    . BOTOX INJECTION N/A 01/22/2018   Procedure: BOTOX INJECTION;  Surgeon: Ronnette Juniper, MD;  Location: [facility];  Service: Gastroenterology;  Laterality: N/A;  . CARDIAC CATHETERIZATION     09  . CARDIAC VALVE REPLACEMENT  01/2008   aortic valve replacement  . CATARACT EXTRACTION W/ INTRAOCULAR LENS  IMPLANT,  BILATERAL    . COLON RESECTION     diverticulitis   . dental implants     permanent  . ESOPHAGEAL MANOMETRY N/A 11/07/2017   Procedure: ESOPHAGEAL MANOMETRY (EM);  Surgeon: Ronnette Juniper, MD;  Location: [facility];  Service: Gastroenterology;  Laterality: N/A;  . ESOPHAGOGASTRODUODENOSCOPY (EGD) WITH PROPOFOL N/A 01/22/2018   Procedure: ESOPHAGOGASTRODUODENOSCOPY (EGD) WITH PROPOFOL;  Surgeon: Ronnette Juniper, MD;  Location: [facility];  Service: Gastroenterology;  Laterality: N/A;  . ESOPHAGOGASTRODUODENOSCOPY (EGD) WITH PROPOFOL N/A  04/30/2019   Procedure: ESOPHAGOGASTRODUODENOSCOPY (EGD) WITH PROPOFOL;  Surgeon: Laurence Spates, MD;  Location: [place];  Service: Endoscopy;  Laterality: N/A;  . EXCISIONAL TOTAL SHOULDER ARTHROPLASTY WITH ANTIBIOTIC SPACER Right 12/31/2018   Procedure: EXCISIONAL TOTAL SHOULDER ARTHROPLASTY WITH ANTIBIOTIC SPACER;  Surgeon: Netta Cedars, MD;  Location: [place];  Service: Orthopedics;  Laterality: Right;  . HERNIA REPAIR    . INTRAOPERATIVE TRANSTHORACIC ECHOCARDIOGRAM N/A 04/15/2019   Procedure: Intraoperative Transthoracic Echocardiogram;  Surgeon: Sherren Mocha, MD;  Location: [place];  Service: Open Heart Surgery;  Laterality: N/A;  . IRRIGATION AND DEBRIDEMENT SHOULDER Right 11/20/2018    IRRIGATION AND DEBRIDEMENT SHOULDER WITH POLY EXCHANGE (Right Shoulder)  . IRRIGATION AND DEBRIDEMENT SHOULDER Right 11/20/2018   Procedure: IRRIGATION AND DEBRIDEMENT SHOULDER WITH POLY EXCHANGE;  Surgeon: Netta Cedars, MD;  Location: [place];  Service: Orthopedics;  Laterality: Right;  . LUMBAR LAMINECTOMY     x 2  . REVERSE SHOULDER ARTHROPLASTY Right 03/01/2018   Procedure: RIGHT REVERSE SHOULDER ARTHROPLASTY;  Surgeon: Netta Cedars, MD;  Location: [place];  Service: Orthopedics;  Laterality: Right;  . RIGHT/LEFT HEART CATH AND CORONARY ANGIOGRAPHY N/A 04/02/2019   Procedure: RIGHT/LEFT HEART CATH AND CORONARY ANGIOGRAPHY;  Surgeon: Leonie Man, MD;  Location: [place] [facility];  Service: Cardiovascular;  Laterality: N/A;  . SAVORY DILATION N/A 01/22/2018   Procedure: SAVORY DILATION;  Surgeon: Ronnette Juniper, MD;  Location: [facility];  Service: Gastroenterology;  Laterality: N/A;  . SAVORY DILATION N/A 04/30/2019   Procedure: SAVORY DILATION;  Surgeon: Laurence Spates, MD;  Location: [place];  Service: Endoscopy;  Laterality: N/A;  With fluro  . SHOULDER HEMI-ARTHROPLASTY Right 06/14/2018   Procedure: RIGHT  REVERSE TOTAL SHOULDER OPEN POLY EXCHANGE;  Surgeon: Netta Cedars, MD;  Location: [place];  Service: Orthopedics;  Laterality: Right;  . TEE WITHOUT CARDIOVERSION N/A 01/29/2014   Procedure: TRANSESOPHAGEAL ECHOCARDIOGRAM (TEE);  Surgeon: Sueanne Margarita, MD;  Location: [facility] ENDOSCOPY;  Service: Cardiovascular;  Laterality: N/A;  . TEE WITHOUT CARDIOVERSION N/A 04/02/2019   Procedure: TRANSESOPHAGEAL ECHOCARDIOGRAM (TEE);  Surgeon: Josue Hector, MD;  Location: [facility] ENDOSCOPY;  Service: Cardiovascular;  Laterality: N/A;  . TONSILLECTOMY    . TRANSCATHETER AORTIC VALVE REPLACEMENT, TRANSFEMORAL  04/15/2019  . TRANSCATHETER AORTIC VALVE REPLACEMENT, TRANSFEMORAL N/A 04/15/2019   Procedure: TRANSCATHETER AORTIC VALVE REPLACEMENT, TRANSFEMORAL with POST BALLOON DILATION;  Surgeon: Sherren Mocha, MD;  Location: [place];  Service: Open Heart Surgery;  Laterality: N/A;     Current Meds  Medication Sig  . acetaminophen (TYLENOL) 500 MG tablet Take 1,000 mg by mouth every 6 (six) hours as needed for moderate pain or headache.  Marland Kitchen aspirin EC 81 MG EC tablet Take 1 tablet (81 mg total) by mouth daily.  . cholecalciferol (VITAMIN D3) 25 MCG (1000 UT) tablet Take 1,000 Units by mouth daily.  . clopidogrel (PLAVIX) 75 MG tablet Take 1 tablet (75 mg total) by mouth daily with breakfast.  . feeding supplement, ENSURE ENLIVE, (ENSURE ENLIVE)  LIQD Take 237 mLs by mouth 3 (three) times daily between meals.  . folic acid (FOLVITE) 1 MG tablet Take 1 mg by mouth daily.  . furosemide (LASIX) 40 MG tablet Take 40 mg daily.  Marland Kitchen MEGARED OMEGA-3 KRILL OIL PO Take 750 mg by mouth daily.   . Melatonin 3 MG TABS Take 1 tablet by mouth at bedtime.  . metoprolol succinate (TOPROL-XL) 25 MG 24 hr tablet Take 1 tablet by mouth once daily (Patient taking differently: Take 25 mg by mouth daily. )  . Multiple Vitamin (MULTIVITAMIN WITH MINERALS) TABS tablet Take 1 tablet by mouth daily.  . pantoprazole (PROTONIX) 40 MG tablet Take 40 mg by mouth daily before breakfast.   . potassium chloride SA (K-DUR) 20 MEQ tablet  Take 20 meq daily  . predniSONE (DELTASONE) 5 MG tablet Take 5 mg by mouth daily with breakfast.   . tamsulosin (FLOMAX) 0.4 MG CAPS capsule Take 0.4 mg by mouth daily.  . THERATEARS 0.25 % SOLN Place 1 drop into both eyes 3 (three) times daily as needed (dry/irritated eyes.).  Marland Kitchen Turmeric 500 MG CAPS Take 500 mg by mouth daily.  . vitamin B-12 (CYANOCOBALAMIN) 1000 MCG tablet Take 1,000 mcg by mouth daily.     Allergies:   Patient has no known allergies.   Social History   Tobacco Use  . Smoking status: Former Smoker    Packs/day: 0.50    Years: 10.00    Pack years: 5.00    Types: Cigarettes    Start date: 01/16/1974    Quit date: 09/05/1983    Years since quitting: 35.7  . Smokeless tobacco: Former Systems developer    Types: Chew  Substance Use Topics  . Alcohol use: Not Currently    Alcohol/week: 0.0 standard drinks  . Drug use: No     Family Hx: The patient's family history includes Arthritis in his father; Heart Problems in his sister; Heart disease in his father; Other in his brother, daughter, mother, and sister; Suicidality in his brother.  ROS:   Please see the history of present illness.    All other systems reviewed and are negative.   Prior CV studies:   The following studies were reviewed today:  TAVR OPERATIVE NOTE   Date of Procedure:04/15/2019  Preoperative Diagnosis:  S/P Aortic Valve Replacement using Stented Bovine Pericardial Tissue Valve  Prosthetic Valve Dysfunction  Severe Aortic Insufficiency  Hemolytic Anemia  Postoperative Diagnosis:Same   Procedure:   Valve-in-ValveTranscatheter Aortic Valve Replacement - PercutaneousRightTransfemoral Approach Edwards Sapien 3 UltraTHV (size 70mm, model # 9750TFX, serial [id number])  Co-Surgeons:Clarence H. Roxy Manns, MD and Sherren Mocha, MD  Anesthesiologist:John Lissa Hoard, MD   Echocardiographer:Peter Johnsie Cancel, MD  Pre-operative Echo Findings: ? Severeprosthetic valveaorticinsufficiency ? Normalleft ventricular systolic function  Post-operative Echo Findings: ? Noparavalvular leak ? Unchangedleft ventricular systolic function   _____________   Echo 04/16/19: IMPRESSIONS 1. The left ventricle has normal systolic function with an ejection fraction of 60-65%. The cavity size was normal. Left ventricular diastolic Doppler parameters are indeterminate. 2. The right ventricle has normal systolic function. The cavity was normal. 3. Left atrial size was mildly dilated. 4. The mitral valve is abnormal. Mild thickening of the mitral valve leaflet. There is mild mitral annular calcification present. 5. The tricuspid valve is grossly normal. 6. The aorta is normal in size and structure. 7. The interatrial septum is aneurysmal. 8. Normal LV systolic function; s/p TAVR with mean gradient of 14 mmHg and no AI; mild MR; mild LAE; mild  TR.   _______________   Echo 05/14/19 IMPRESSIONS  1. The average left ventricular global longitudinal strain is normal at -21.3 %.  2. The left ventricle has normal systolic function, with an ejection fraction of 55-60%. The cavity size was normal. Left ventricular diastolic Doppler parameters are consistent with pseudonormalization. No evidence of left ventricular regional wall  motion abnormalities.  3. The right ventricle has mildly reduced systolic function. The cavity was mildly enlarged. There is no increase in right ventricular wall thickness. Right ventricular systolic pressure is mildly elevated with an estimated pressure of 38.1 mmHg.  4. Left atrial size was mildly dilated.  5. Right atrial size was severely dilated.  6. There is moderate mitral annular calcification present.  7. A 26 Edwards Sapien bioprosthetic aortic valve (TAVR) valve is present in the aortic position. Procedure Date:  04/15/19 Normal aortic valve prosthesis.  8. The aorta is normal unless otherwise noted.  9. - TAVR: S/P 59mm Edwards Sapien bioprosthetic AVR that appears to have normal function. The mean AVG is normal at 89mmHg, peak AV velocity 254 cm/s, AVA 1.84cm2 by VTI and dimensionless index 0.48. There is trivial perivalvular AI. 10. The inferior vena cava was normal in size with <50% respiratory variability.  Labs/Other Tests and Data Reviewed:    EKG:  No ECG reviewed.  Recent Labs: 04/16/2019: Magnesium 1.9 04/23/2019: NT-Pro BNP 1,582 04/28/2019: B Natriuretic Peptide 332.0 05/06/2019: ALT 25; BUN 20; Creatinine 0.87; Hemoglobin 7.6; Platelet Count 99; Potassium 4.6; Sodium 139   Recent Lipid Panel Lab Results  Component Value Date/Time   CHOL  01/14/2008 04:50 AM    174        ATP III CLASSIFICATION:  <200     mg/dL   Desirable  200-239  mg/dL   Borderline High  >=240    mg/dL   High   TRIG 43 01/14/2008 04:50 AM   HDL 65 01/14/2008 04:50 AM   CHOLHDL 2.7 01/14/2008 04:50 AM   LDLCALC (H) 01/14/2008 04:50 AM    100        Total Cholesterol/HDL:CHD Risk Coronary Heart Disease Risk Table                     Men   Women  1/2 Average Risk   3.4   3.3    Wt Readings from Last 3 Encounters:  05/15/19 95 lb (43.1 kg)  05/06/19 104 lb 4.8 oz (47.3 kg)  05/01/19 92 lb 9.5 oz (42 kg)     Objective:    Vital Signs:  BP 112/68   Pulse 64   Ht 5\' 2"  (1.575 m)   Wt 95 lb (43.1 kg)   BMI 17.38 kg/m    ASSESSMENT & PLAN:    Prosthetic valve dysfunctions/p valve in valve TAVR:Echo yesterday showed EF 55 to 60% with normally functioning TAVR valve with a mean gradient of 14 mmHg and trivial PVL.  He has NYHA class III symptoms of continued shortness of breath and fatigue.  I think this is multifactorial secondary to his anemia and mild volume overload.  Acute on chronic diastolic CHF: has volume overloaded again after recent admission where diuretics held and blood transfusion last  week. Will increase lasix 40mg  BID x 3 days and then resume home dosing. BMET next week. I have asked Dr. Lindi Adie to add this to his previously scheduled lab work.  Hemolytic anemia:repeat Haptoglobin was normal but LDH was mildly elevated.    Pancytopenia:followed by Dr. Lindi Adie.  MTX and Arava were discontinued.  He was transfused 1 unit of PRBCs last week.   Achlasia: s/p recent esophageal dilation.  He has better p.o. intake since recent dilation. He is being referred to [facility] for consideration of myotomy.   Insomnia: he has a lot of anxiety around bedtime and has not gotten any sleep. I will call in a small dose of alprazolam 0.25mg  to see if this helps him get some rest.  Shoulder replacement: the patient will require a total shoulder replacement at some point. He is cleared from a cardiac standpoint to proceed with the surgery. If surgery must be done before 6 months of DAPT, this can be considered after 6 weeks out from TAVR and aspirin and Plavix can be temporarily held.  COVID-19 Education: The signs and symptoms of COVID-19 were discussed with the patient and how to seek care for testing (follow up with PCP or arrange E-visit).  The importance of social distancing was discussed today.  Time:   Today, I have spent 20 minutes with the patient with telehealth technology discussing the above problems.     Medication Adjustments/Labs and Tests Ordered: Current medicines are reviewed at length with the patient today.  Concerns regarding medicines are outlined above.   Tests Ordered: Orders Placed This Encounter  Procedures  . Basic Metabolic Panel (BMET)  . ECHOCARDIOGRAM COMPLETE    Medication Changes: Meds ordered this encounter  Medications  . ALPRAZolam (XANAX) 0.25 MG tablet    Sig: Take 1 tablet (0.25 mg total) by mouth at bedtime as needed for anxiety or sleep.    Dispense:  30 tablet    Refill:  0    Order Specific Question:   Supervising Provider    Answer:    Burt Knack, MICHAEL [id number]    Disposition:  Follow up 1 year with me.  Signed, Angelena Form, PA-C  05/15/2019 3:47 PM    [facility]

## 2019-05-14 ENCOUNTER — Ambulatory Visit (HOSPITAL_COMMUNITY): Payer: Medicare Other | Attending: Cardiology

## 2019-05-14 ENCOUNTER — Other Ambulatory Visit: Payer: Self-pay

## 2019-05-14 DIAGNOSIS — Z952 Presence of prosthetic heart valve: Secondary | ICD-10-CM

## 2019-05-15 ENCOUNTER — Telehealth (INDEPENDENT_AMBULATORY_CARE_PROVIDER_SITE_OTHER): Payer: Medicare Other | Admitting: Physician Assistant

## 2019-05-15 ENCOUNTER — Other Ambulatory Visit: Payer: Self-pay | Admitting: *Deleted

## 2019-05-15 VITALS — BP 112/68 | HR 64 | Ht 62.0 in | Wt 95.0 lb

## 2019-05-15 DIAGNOSIS — D598 Other acquired hemolytic anemias: Secondary | ICD-10-CM | POA: Diagnosis not present

## 2019-05-15 DIAGNOSIS — I5032 Chronic diastolic (congestive) heart failure: Secondary | ICD-10-CM

## 2019-05-15 DIAGNOSIS — Z952 Presence of prosthetic heart valve: Secondary | ICD-10-CM

## 2019-05-15 DIAGNOSIS — D61818 Other pancytopenia: Secondary | ICD-10-CM | POA: Diagnosis not present

## 2019-05-15 DIAGNOSIS — K220 Achalasia of cardia: Secondary | ICD-10-CM

## 2019-05-15 DIAGNOSIS — I350 Nonrheumatic aortic (valve) stenosis: Secondary | ICD-10-CM

## 2019-05-15 MED ORDER — ALPRAZOLAM 0.25 MG PO TABS: 0.2500 mg | ORAL_TABLET | Freq: Every evening | ORAL | 0 refills | Status: DC | PRN

## 2019-05-15 NOTE — Patient Instructions (Signed)
Medication Instructions:  Your physician has recommended you make the following change in your medication: Increase furosemide to 40 mg twice daily and increase potassium to 20 meq twice daily for 3 days. After this go back to normal dose. Start alprazolam 0.25 mg by mouth daily at bedtime as needed for insomnia. Stop Clopidogrel 6 months after TAVR.  This is February 11,2021.   If you need a refill on your cardiac medications before your next appointment, please call your pharmacy.   Lab work: Have BMP checked when lab work done by Dr. Lindi Adie on September 15,2020   If you have labs (blood work) drawn today and your tests are completely normal, you will receive your results only by: Marland Kitchen MyChart Message (if you have MyChart) OR . A paper copy in the mail If you have any lab test that is abnormal or we need to change your treatment, we will call you to review the results.  Testing/Procedures: Your physician has requested that you have an echocardiogram. Echocardiography is a painless test that uses sound waves to create images of your heart. It provides your doctor with information about the size and shape of your heart and how well your heart's chambers and valves are working. This procedure takes approximately one hour. There are no restrictions for this procedure. To be done in August 2021.  Scheduled for August 11,2021  Follow-Up: Follow up with Dr. Radford Pax as planned on October 20,2020.  You are scheduled to see K. Grandville Silos, [place] after your echocardiogram on August 11,2021.

## 2019-05-15 NOTE — Progress Notes (Signed)
thx Katie 

## 2019-05-16 ENCOUNTER — Other Ambulatory Visit: Payer: Self-pay | Admitting: Hematology and Oncology

## 2019-05-16 DIAGNOSIS — D61818 Other pancytopenia: Secondary | ICD-10-CM

## 2019-05-19 NOTE — Progress Notes (Signed)
Patient Care Team: Lavone Orn, MD as PCP - General (Internal Medicine) Sueanne Margarita, MD as PCP - Cardiology (Cardiology) Sueanne Margarita, MD as Consulting Physician (Cardiology)  DIAGNOSIS:    ICD-10-CM   1. Other pancytopenia ([place])  D61.818   2. Thrombocytopenia (HCC)  D69.6     CHIEF COMPLIANT: Follow-up of anemia and thrombocytopenia    INTERVAL HISTORY: Joshua Hudson is a 84 y.o. with above-mentioned history of anemia and thrombocytopenia. He received one unit of PRBCS on 05/07/19. He presents to the clinic today to review his labs and discuss further treatment.   REVIEW OF SYSTEMS:   Constitutional: Denies fevers, chills or abnormal weight loss Eyes: Denies blurriness of vision Ears, nose, mouth, throat, and face: Denies mucositis or sore throat Respiratory: Denies cough, dyspnea or wheezes Cardiovascular: Denies palpitation, chest discomfort Gastrointestinal: Denies nausea, heartburn or change in bowel habits Skin: Denies abnormal skin rashes Lymphatics: Denies new lymphadenopathy or easy bruising Neurological: Denies numbness, tingling or new weaknesses Behavioral/Psych: Mood is stable, no new changes  Extremities: No lower extremity edema All other systems were reviewed with the patient and are negative.  I have reviewed the past medical history, past surgical history, social history and family history with the patient and they are unchanged from previous note.  ALLERGIES:  has No Known Allergies.  MEDICATIONS:  Current Outpatient Medications  Medication Sig Dispense Refill  . acetaminophen (TYLENOL) 500 MG tablet Take 1,000 mg by mouth every 6 (six) hours as needed for moderate pain or headache.    . ALPRAZolam (XANAX) 0.25 MG tablet Take 1 tablet (0.25 mg total) by mouth at bedtime as needed for anxiety or sleep. 30 tablet 0  . aspirin EC 81 MG EC tablet Take 1 tablet (81 mg total) by mouth daily. 30 tablet   . cholecalciferol (VITAMIN D3) 25 MCG (1000 UT)  tablet Take 1,000 Units by mouth daily.    . clopidogrel (PLAVIX) 75 MG tablet Take 1 tablet (75 mg total) by mouth daily with breakfast. 90 tablet 1  . feeding supplement, ENSURE ENLIVE, (ENSURE ENLIVE) LIQD Take 237 mLs by mouth 3 (three) times daily between meals. 123XX123 mL 12  . folic acid (FOLVITE) 1 MG tablet Take 1 mg by mouth daily.    . furosemide (LASIX) 40 MG tablet Take 40 mg daily. 90 tablet 3  . MEGARED OMEGA-3 KRILL OIL PO Take 750 mg by mouth daily.     . Melatonin 3 MG TABS Take 1 tablet by mouth at bedtime.    . metoprolol succinate (TOPROL-XL) 25 MG 24 hr tablet Take 1 tablet by mouth once daily (Patient taking differently: Take 25 mg by mouth daily. ) 90 tablet 1  . Multiple Vitamin (MULTIVITAMIN WITH MINERALS) TABS tablet Take 1 tablet by mouth daily.    . pantoprazole (PROTONIX) 40 MG tablet Take 40 mg by mouth daily before breakfast.     . potassium chloride SA (K-DUR) 20 MEQ tablet Take 20 meq daily 90 tablet 3  . predniSONE (DELTASONE) 5 MG tablet Take 5 mg by mouth daily with breakfast.     . tamsulosin (FLOMAX) 0.4 MG CAPS capsule Take 0.4 mg by mouth daily.    . THERATEARS 0.25 % SOLN Place 1 drop into both eyes 3 (three) times daily as needed (dry/irritated eyes.).    Marland Kitchen Turmeric 500 MG CAPS Take 500 mg by mouth daily.    . vitamin B-12 (CYANOCOBALAMIN) 1000 MCG tablet Take 1,000 mcg by mouth  daily.     No current facility-administered medications for this visit.     PHYSICAL EXAMINATION: ECOG PERFORMANCE STATUS: 1 - Symptomatic but completely ambulatory  Vitals:   05/20/19 1401  BP: 104/60  Pulse: 65  Resp: 18  Temp: 98.2 F (36.8 C)  SpO2: 100%   Filed Weights   05/20/19 1401  Weight: 99 lb 1.6 oz (45 kg)    GENERAL: alert, no distress and comfortable SKIN: skin color, texture, turgor are normal, no rashes or significant lesions EYES: normal, Conjunctiva are pink and non-injected, sclera clear OROPHARYNX: no exudate, no erythema and lips, buccal  mucosa, and tongue normal  NECK: supple, thyroid normal size, non-tender, without nodularity LYMPH: no palpable lymphadenopathy in the cervical, axillary or inguinal LUNGS: clear to auscultation and percussion with normal breathing effort HEART: regular rate & rhythm and no murmurs and no lower extremity edema ABDOMEN: abdomen soft, non-tender and normal bowel sounds MUSCULOSKELETAL: no cyanosis of digits and no clubbing  NEURO: alert & oriented x 3 with fluent speech, no focal motor/sensory deficits EXTREMITIES: No lower extremity edema  LABORATORY DATA:  I have reviewed the data as listed CMP Latest Ref Rng & Units 05/06/2019 04/30/2019 04/29/2019  Glucose 70 - 99 mg/dL 110(H) 80 72  BUN 8 - 23 mg/dL 20 19 26(H)  Creatinine 0.61 - 1.24 mg/dL 0.87 0.89 1.22  Sodium 135 - 145 mmol/L 139 137 138  Potassium 3.5 - 5.1 mmol/L 4.6 3.5 3.4(L)  Chloride 98 - 111 mmol/L 107 102 101  CO2 22 - 32 mmol/L 26 28 26   Calcium 8.9 - 10.3 mg/dL 9.1 8.4(L) 8.0(L)  Total Protein 6.5 - 8.1 g/dL 5.9(L) - -  Total Bilirubin 0.3 - 1.2 mg/dL 0.6 - -  Alkaline Phos 38 - 126 U/L 77 - -  AST 15 - 41 U/L 34 - -  ALT 0 - 44 U/L 25 - -    Lab Results  Component Value Date   WBC 6.8 05/20/2019   HGB 9.4 (L) 05/20/2019   HCT 30.0 (L) 05/20/2019   MCV 78.7 (L) 05/20/2019   PLT 152 05/20/2019   NEUTROABS 5.2 05/20/2019    ASSESSMENT & PLAN:  Other pancytopenia ([place]) Microcytic anemia: Longstanding mild anemia was present previously from 2015.  His anemia has gotten worse since June 12, 2018.  In March 2020 his hemoglobin was 6.7 and required blood transfusion In the hospital work-up: Ferritin 123XX123, folic acid greater than 100, B12 3284, LDH 264  On 05/06/2019: Hemoglobin 7.6: Blood transfusion given Hemoglobin electrophoresis: Beta thalassemia minor.  This is the cause of microcytosis.  It is not iron deficiency related.  Lab review: Hemoglobin 9.4, platelets 152, WBC 6.8  Possible differential:  Myelodysplastic syndrome versus methotrexate related toxicity. His methotrexate has been on hold.  Because of that his blood counts are improving.  Thrombocytopenia ([place]) Started somewhere around July 2020.  His platelet levels have fluctuated up and down and most recently have been between 70-80 K. Today's CBC: Platelet count has normalized to 152 Platelet count has improved after coming off methotrexate.  Return to clinic in 6 weeks with recheck of labs and follow-up.  No orders of the defined types were placed in this encounter.  The patient has a good understanding of the overall plan. he agrees with it. he will call with any problems that may develop before the next visit here.  Nicholas Lose, MD 05/20/2019  Julious Oka Dorshimer am acting as scribe for Dr. Nicholas Lose.  I have reviewed the above documentation for accuracy and completeness, and I agree with the above.

## 2019-05-20 ENCOUNTER — Other Ambulatory Visit: Payer: Self-pay

## 2019-05-20 ENCOUNTER — Inpatient Hospital Stay (HOSPITAL_BASED_OUTPATIENT_CLINIC_OR_DEPARTMENT_OTHER): Payer: Medicare Other | Admitting: Hematology and Oncology

## 2019-05-20 ENCOUNTER — Inpatient Hospital Stay: Payer: Medicare Other

## 2019-05-20 DIAGNOSIS — D696 Thrombocytopenia, unspecified: Secondary | ICD-10-CM | POA: Insufficient documentation

## 2019-05-20 DIAGNOSIS — D61818 Other pancytopenia: Secondary | ICD-10-CM | POA: Diagnosis not present

## 2019-05-20 LAB — CBC WITH DIFFERENTIAL (CANCER CENTER ONLY)
Abs Immature Granulocytes: 0.03 10*3/uL (ref 0.00–0.07)
Basophils Absolute: 0 10*3/uL (ref 0.0–0.1)
Basophils Relative: 1 %
Eosinophils Absolute: 0 10*3/uL (ref 0.0–0.5)
Eosinophils Relative: 1 %
HCT: 30 % — ABNORMAL LOW (ref 39.0–52.0)
Hemoglobin: 9.4 g/dL — ABNORMAL LOW (ref 13.0–17.0)
Immature Granulocytes: 0 %
Lymphocytes Relative: 13 %
Lymphs Abs: 0.9 10*3/uL (ref 0.7–4.0)
MCH: 24.7 pg — ABNORMAL LOW (ref 26.0–34.0)
MCHC: 31.3 g/dL (ref 30.0–36.0)
MCV: 78.7 fL — ABNORMAL LOW (ref 80.0–100.0)
Monocytes Absolute: 0.6 10*3/uL (ref 0.1–1.0)
Monocytes Relative: 9 %
Neutro Abs: 5.2 10*3/uL (ref 1.7–7.7)
Neutrophils Relative %: 76 %
Platelet Count: 152 10*3/uL (ref 150–400)
RBC: 3.81 MIL/uL — ABNORMAL LOW (ref 4.22–5.81)
RDW: 23.9 % — ABNORMAL HIGH (ref 11.5–15.5)
WBC Count: 6.8 10*3/uL (ref 4.0–10.5)
nRBC: 0 % (ref 0.0–0.2)

## 2019-05-20 LAB — CMP (CANCER CENTER ONLY)
ALT: 16 U/L (ref 0–44)
AST: 33 U/L (ref 15–41)
Albumin: 3.6 g/dL (ref 3.5–5.0)
Alkaline Phosphatase: 82 U/L (ref 38–126)
Anion gap: 5 (ref 5–15)
BUN: 20 mg/dL (ref 8–23)
CO2: 30 mmol/L (ref 22–32)
Calcium: 8.7 mg/dL — ABNORMAL LOW (ref 8.9–10.3)
Chloride: 105 mmol/L (ref 98–111)
Creatinine: 0.9 mg/dL (ref 0.61–1.24)
GFR, Est AFR Am: >60 mL/min (ref >60)
GFR, Estimated: >60 mL/min (ref >60)
Glucose, Bld: 111 mg/dL — ABNORMAL HIGH (ref 70–99)
Potassium: 4.5 mmol/L (ref 3.5–5.1)
Sodium: 140 mmol/L (ref 135–145)
Total Bilirubin: 0.6 mg/dL (ref 0.3–1.2)
Total Protein: 6 g/dL — ABNORMAL LOW (ref 6.5–8.1)

## 2019-05-20 LAB — SAMPLE TO BLOOD BANK

## 2019-05-20 NOTE — Assessment & Plan Note (Signed)
Microcytic anemia: Longstanding mild anemia was present previously from 2015.  His anemia has gotten worse since June 12, 2018.  In March 2020 his hemoglobin was 6.7 and required blood transfusion In the hospital work-up: Ferritin 123XX123, folic acid greater than 100, B12 3284, LDH 264  On 05/06/2019: Hemoglobin 7.6: Blood transfusion given Hemoglobin electrophoresis: Beta thalassemia minor.  This is the cause of microcytosis.  It is not iron deficiency related.  Lab review:  Possible differential: Myelodysplastic syndrome versus methotrexate related toxicity. His methotrexate has been on hold.

## 2019-05-20 NOTE — Assessment & Plan Note (Signed)
Started somewhere around July 2020.  His platelet levels have fluctuated up and down and most recently have been between 70-80 K. Today's CBC: Platelet count has improved after coming off methotrexate.

## 2019-05-21 ENCOUNTER — Other Ambulatory Visit: Payer: Self-pay | Admitting: Gastroenterology

## 2019-05-21 ENCOUNTER — Telehealth: Payer: Self-pay | Admitting: Hematology and Oncology

## 2019-05-21 NOTE — Telephone Encounter (Signed)
I left a message regarding schedule  

## 2019-05-31 ENCOUNTER — Emergency Department (HOSPITAL_COMMUNITY)
Admission: EM | Admit: 2019-05-31 | Discharge: 2019-05-31 | Disposition: A | Discharge status: Home / Self Care | Payer: Medicare Other | Attending: Emergency Medicine | Admitting: Emergency Medicine

## 2019-05-31 ENCOUNTER — Other Ambulatory Visit: Payer: Self-pay

## 2019-05-31 ENCOUNTER — Encounter (HOSPITAL_COMMUNITY): Payer: Self-pay | Admitting: *Deleted

## 2019-05-31 DIAGNOSIS — R319 Hematuria, unspecified: Secondary | ICD-10-CM | POA: Diagnosis present

## 2019-05-31 DIAGNOSIS — R310 Gross hematuria: Secondary | ICD-10-CM | POA: Diagnosis not present

## 2019-05-31 DIAGNOSIS — Z79899 Other long term (current) drug therapy: Secondary | ICD-10-CM | POA: Insufficient documentation

## 2019-05-31 DIAGNOSIS — Z7982 Long term (current) use of aspirin: Secondary | ICD-10-CM | POA: Insufficient documentation

## 2019-05-31 DIAGNOSIS — Z87891 Personal history of nicotine dependence: Secondary | ICD-10-CM | POA: Insufficient documentation

## 2019-05-31 DIAGNOSIS — I110 Hypertensive heart disease with heart failure: Secondary | ICD-10-CM | POA: Insufficient documentation

## 2019-05-31 DIAGNOSIS — I5032 Chronic diastolic (congestive) heart failure: Secondary | ICD-10-CM | POA: Insufficient documentation

## 2019-05-31 LAB — BASIC METABOLIC PANEL
Anion gap: 9 (ref 5–15)
BUN: 14 mg/dL (ref 8–23)
CO2: 25 mmol/L (ref 22–32)
Calcium: 9 mg/dL (ref 8.9–10.3)
Chloride: 104 mmol/L (ref 98–111)
Creatinine, Ser: 0.98 mg/dL (ref 0.61–1.24)
GFR calc Af Amer: >60 mL/min (ref >60)
GFR calc non Af Amer: >60 mL/min (ref >60)
Glucose, Bld: 91 mg/dL (ref 70–99)
Potassium: 4 mmol/L (ref 3.5–5.1)
Sodium: 138 mmol/L (ref 135–145)

## 2019-05-31 LAB — URINALYSIS, ROUTINE W REFLEX MICROSCOPIC
Bacteria, UA: NONE SEEN
Bilirubin Urine: NEGATIVE
Glucose, UA: NEGATIVE mg/dL
Ketones, ur: NEGATIVE mg/dL
Leukocytes,Ua: NEGATIVE
Nitrite: NEGATIVE
Protein, ur: NEGATIVE mg/dL
RBC / HPF: >50 RBC/hpf — ABNORMAL HIGH (ref 0–5)
Specific Gravity, Urine: 1.004 — ABNORMAL LOW (ref 1.005–1.030)
pH: 7 (ref 5.0–8.0)

## 2019-05-31 LAB — CBC
HCT: 30 % — ABNORMAL LOW (ref 39.0–52.0)
Hemoglobin: 9.3 g/dL — ABNORMAL LOW (ref 13.0–17.0)
MCH: 24.2 pg — ABNORMAL LOW (ref 26.0–34.0)
MCHC: 31 g/dL (ref 30.0–36.0)
MCV: 78.1 fL — ABNORMAL LOW (ref 80.0–100.0)
Platelets: 115 10*3/uL — ABNORMAL LOW (ref 150–400)
RBC: 3.84 MIL/uL — ABNORMAL LOW (ref 4.22–5.81)
RDW: 22.4 % — ABNORMAL HIGH (ref 11.5–15.5)
WBC: 7.9 10*3/uL (ref 4.0–10.5)
nRBC: 0 % (ref 0.0–0.2)

## 2019-05-31 LAB — PROTIME-INR
INR: 1.2 (ref 0.8–1.2)
Prothrombin Time: 15.2 seconds (ref 11.4–15.2)

## 2019-05-31 NOTE — ED Notes (Signed)
Patient verbalizes understanding of discharge instructions. Opportunity for questioning and answers were provided. Armband removed by staff, pt discharged from ED.  

## 2019-05-31 NOTE — Progress Notes (Signed)
ED work-up reassuring.

## 2019-05-31 NOTE — Discharge Instructions (Signed)
Return to the ER if you have trouble with fever or difficulty urinating.  Follow-up with [facility] to further evaluate the cause of the blood in your urine

## 2019-05-31 NOTE — ED Triage Notes (Signed)
States he went to the bathroom this am and urinated bright red blood with small clots, denies pain states he hasn't ever had anything like this happen before.

## 2019-05-31 NOTE — ED Provider Notes (Signed)
[facility] EMERGENCY DEPARTMENT Provider Note   CSN: [id number] Arrival date & time: 05/31/19  0907     History   Chief Complaint Chief Complaint  Patient presents with  . Hematuria    HPI Joshua Hudson is a 84 y.o. male.     HPI Patient presented to the emergency room for evaluation of hematuria.  Patient states he noted the blood in his urine this morning when he went to the bathroom.  When he urinated it looked like it was just straight blood.  He also felt like he passed some small clots.  Patient has urinated since and now the blood seems to be clearing.  The last time he urinated he felt like his urine looked pretty clear.  He denies any fevers or chills.  No recent injuries.  He is not having any back pain or abdominal pain.  Patient does have a history of multiple medical problems and does take Plavix. Past Medical History:  Diagnosis Date  . Aortic insufficiency 03/24/2019   AI of bioprosthetic AVR  . Chronic diastolic CHF (congestive heart failure) ([place]) 03/24/2019  . Colon polyps    s/p diverticular perforation requiring 2-stage repair  . Diverticulosis   . DJD (degenerative joint disease), lumbosacral   . Esophageal stricture   . GERD (gastroesophageal reflux disease)   . Hemolytic anemia ([place])   . History of blood transfusion    patient states "years ago"  . History of kidney stones    passed stones  . Hypertension   . Mitral regurgitation    moderate  . Occipital neuralgia   . Postoperative atrial fibrillation ([place]) 05/10/2015  . Prosthetic valve dysfunction   . Rheumatoid arthritis ([place])    s/o long term steroids  shoulders and hands  . Rotator cuff arthropathy    right  . S/P aortic valve replacement with bioprosthetic valve 01/16/2008   70mm Edwards Magna perimount bovine pericardial tissue valve, model 3000  . S/P valve-in-valve TAVR 04/15/2019   26 mm Edwards Sapien 3 Ultra transcatheter heart valve placed via percutaneous right  transfemoral approach   . SBE (subacute bacterial endocarditis) prophylaxis candidate    for dental procedures  . Severe aortic stenosis    S/P prosthetic valve replacement w 25 mm Edwards like science percardial tissue valve,[place] - 01/2008    Patient Active Problem List   Diagnosis Date Noted  . Thrombocytopenia ([place]) 05/20/2019  . Other pancytopenia ([place]) 05/06/2019  . Protein-calorie malnutrition, severe 04/30/2019  . FTT (failure to thrive) in adult 04/28/2019  . GERD (gastroesophageal reflux disease)   . Esophageal stricture   . History of esophageal stricture   . Odynophagia   . Hypertension   . Rheumatoid arthritis ([place])   . S/P valve-in-valve TAVR   . Prosthetic valve dysfunction   . Severe aortic regurgitation 04/02/2019  . Mitral regurgitation 05/16/2016  . Postoperative atrial fibrillation ([place]) 05/10/2015  . Hemolytic anemia ([place]) 02/25/2014  . Acute on chronic diastolic heart failure ([place]) 02/25/2014  . Dysphagia 02/25/2014  . S/P aortic valve replacement with bioprosthetic valve 01/16/2008    Past Surgical History:  Procedure Laterality Date  . APPENDECTOMY    . BOTOX INJECTION N/A 01/22/2018   Procedure: BOTOX INJECTION;  Surgeon: Ronnette Juniper, MD;  Location: [facility];  Service: Gastroenterology;  Laterality: N/A;  . CARDIAC CATHETERIZATION     09  . CARDIAC VALVE REPLACEMENT  01/2008   aortic valve replacement  . CATARACT EXTRACTION W/ INTRAOCULAR LENS  IMPLANT, BILATERAL    . COLON RESECTION     diverticulitis   . dental implants     permanent  . ESOPHAGEAL MANOMETRY N/A 11/07/2017   Procedure: ESOPHAGEAL MANOMETRY (EM);  Surgeon: Ronnette Juniper, MD;  Location: [facility];  Service: Gastroenterology;  Laterality: N/A;  . ESOPHAGOGASTRODUODENOSCOPY (EGD) WITH PROPOFOL N/A 01/22/2018   Procedure: ESOPHAGOGASTRODUODENOSCOPY (EGD) WITH PROPOFOL;  Surgeon: Ronnette Juniper, MD;  Location: [facility];  Service: Gastroenterology;  Laterality: N/A;  .  ESOPHAGOGASTRODUODENOSCOPY (EGD) WITH PROPOFOL N/A 04/30/2019   Procedure: ESOPHAGOGASTRODUODENOSCOPY (EGD) WITH PROPOFOL;  Surgeon: Laurence Spates, MD;  Location: [facility];  Service: Endoscopy;  Laterality: N/A;  . EXCISIONAL TOTAL SHOULDER ARTHROPLASTY WITH ANTIBIOTIC SPACER Right 12/31/2018   Procedure: EXCISIONAL TOTAL SHOULDER ARTHROPLASTY WITH ANTIBIOTIC SPACER;  Surgeon: Netta Cedars, MD;  Location: [facility];  Service: Orthopedics;  Laterality: Right;  . HERNIA REPAIR    . INTRAOPERATIVE TRANSTHORACIC ECHOCARDIOGRAM N/A 04/15/2019   Procedure: Intraoperative Transthoracic Echocardiogram;  Surgeon: Sherren Mocha, MD;  Location: [facility];  Service: Open Heart Surgery;  Laterality: N/A;  . IRRIGATION AND DEBRIDEMENT SHOULDER Right 11/20/2018    IRRIGATION AND DEBRIDEMENT SHOULDER WITH POLY EXCHANGE (Right Shoulder)  . IRRIGATION AND DEBRIDEMENT SHOULDER Right 11/20/2018   Procedure: IRRIGATION AND DEBRIDEMENT SHOULDER WITH POLY EXCHANGE;  Surgeon: Netta Cedars, MD;  Location: [facility];  Service: Orthopedics;  Laterality: Right;  . LUMBAR LAMINECTOMY     x 2  . REVERSE SHOULDER ARTHROPLASTY Right 03/01/2018   Procedure: RIGHT REVERSE SHOULDER ARTHROPLASTY;  Surgeon: Netta Cedars, MD;  Location: [facility];  Service: Orthopedics;  Laterality: Right;  . RIGHT/LEFT HEART CATH AND CORONARY ANGIOGRAPHY N/A 04/02/2019   Procedure: RIGHT/LEFT HEART CATH AND CORONARY ANGIOGRAPHY;  Surgeon: Leonie Man, MD;  Location: [facility];  Service: Cardiovascular;  Laterality: N/A;  . SAVORY DILATION N/A 01/22/2018   Procedure: SAVORY DILATION;  Surgeon: Ronnette Juniper, MD;  Location: [facility];  Service: Gastroenterology;  Laterality: N/A;  . SAVORY DILATION N/A 04/30/2019   Procedure: SAVORY DILATION;  Surgeon: Laurence Spates, MD;  Location: [facility];  Service: Endoscopy;  Laterality: N/A;  With fluro  . SHOULDER HEMI-ARTHROPLASTY Right 06/14/2018   Procedure: RIGHT  REVERSE TOTAL SHOULDER OPEN POLY  EXCHANGE;  Surgeon: Netta Cedars, MD;  Location: [facility];  Service: Orthopedics;  Laterality: Right;  . TEE WITHOUT CARDIOVERSION N/A 01/29/2014   Procedure: TRANSESOPHAGEAL ECHOCARDIOGRAM (TEE);  Surgeon: Sueanne Margarita, MD;  Location: [facility] ENDOSCOPY;  Service: Cardiovascular;  Laterality: N/A;  . TEE WITHOUT CARDIOVERSION N/A 04/02/2019   Procedure: TRANSESOPHAGEAL ECHOCARDIOGRAM (TEE);  Surgeon: Josue Hector, MD;  Location: [facility] ENDOSCOPY;  Service: Cardiovascular;  Laterality: N/A;  . TONSILLECTOMY    . TRANSCATHETER AORTIC VALVE REPLACEMENT, TRANSFEMORAL  04/15/2019  . TRANSCATHETER AORTIC VALVE REPLACEMENT, TRANSFEMORAL N/A 04/15/2019   Procedure: TRANSCATHETER AORTIC VALVE REPLACEMENT, TRANSFEMORAL with POST BALLOON DILATION;  Surgeon: Sherren Mocha, MD;  Location: [place] [facility];  Service: Open Heart Surgery;  Laterality: N/A;        Home Medications    Prior to Admission medications   Medication Sig Start Date End Date Taking? Authorizing Provider  acetaminophen (TYLENOL) 500 MG tablet Take 1,000 mg by mouth every 6 (six) hours as needed for moderate pain or headache.    [provider]  ALPRAZolam Duanne Moron) 0.25 MG tablet Take 1 tablet (0.25 mg total) by mouth at bedtime as needed for anxiety or sleep. 05/15/19   Eileen Stanford, PA-C  aspirin EC 81 MG EC  tablet Take 1 tablet (81 mg total) by mouth daily. 01/02/14   Sueanne Margarita, MD  cholecalciferol (VITAMIN D3) 25 MCG (1000 UT) tablet Take 1,000 Units by mouth daily.    [provider]  clopidogrel (PLAVIX) 75 MG tablet Take 1 tablet (75 mg total) by mouth daily with breakfast. 04/17/19   Eileen Stanford, PA-C  feeding supplement, ENSURE ENLIVE, (ENSURE ENLIVE) LIQD Take 237 mLs by mouth 3 (three) times daily between meals. 05/02/19   Hongalgi, Lenis Dickinson, MD  folic acid (FOLVITE) 1 MG tablet Take 1 mg by mouth daily.    [provider]  furosemide (LASIX) 40 MG tablet Take 40 mg daily. 04/16/19   Eileen Stanford, PA-C  MEGARED OMEGA-3 KRILL OIL PO Take 750 mg by mouth daily.     [provider]  Melatonin 3 MG TABS Take 1 tablet by mouth at bedtime.    [provider]  metoprolol succinate (TOPROL-XL) 25 MG 24 hr tablet Take 1 tablet by mouth once daily Patient taking differently: Take 25 mg by mouth daily.  03/17/19   Sueanne Margarita, MD  Multiple Vitamin (MULTIVITAMIN WITH MINERALS) TABS tablet Take 1 tablet by mouth daily.    [provider]  pantoprazole (PROTONIX) 40 MG tablet Take 40 mg by mouth daily before breakfast.     [provider]  potassium chloride SA (K-DUR) 20 MEQ tablet Take 20 meq daily 04/16/19   Eileen Stanford, PA-C  predniSONE (DELTASONE) 5 MG tablet Take 5 mg by mouth daily with breakfast.     [provider]  tamsulosin (FLOMAX) 0.4 MG CAPS capsule Take 0.4 mg by mouth daily.    [provider]  THERATEARS 0.25 % SOLN Place 1 drop into both eyes 3 (three) times daily as needed (dry/irritated eyes.).    [provider]  Turmeric 500 MG CAPS Take 500 mg by mouth daily.    [provider]  vitamin B-12 (CYANOCOBALAMIN) 1000 MCG tablet Take 1,000 mcg by mouth daily.    [provider]    Family History Family History  Problem Relation Age of Onset  . Other Mother        NO HEALTH PROBLEMS  . Heart disease Father   . Arthritis Father   . Heart Problems Sister        RELATED TO A MVA  . Suicidality Brother   . Other Sister        [place]  . Other Brother        2 [place]  . Other Daughter        2 [place]    Social History Social History   Tobacco Use  . Smoking status: Former Smoker    Packs/day: 0.50    Years: 10.00    Pack years: 5.00    Types: Cigarettes    Start date: 01/16/1974    Quit date: 09/05/1983    Years since quitting: 35.7  . Smokeless tobacco: Former Systems developer    Types: Chew  Substance Use Topics  . Alcohol use: Not Currently     Alcohol/week: 0.0 standard drinks  . Drug use: No     Allergies   Patient has no known allergies.   Review of Systems Review of Systems  All other systems reviewed and are negative.    Physical Exam Updated Vital Signs BP 136/74   Pulse (!) 52   Temp 98.2  F (36.8 C) (Oral)   Resp 16   Ht 1.575 m (5\' 2" )   Wt 45.4 kg   SpO2 99%   BMI 18.29 kg/m   Physical Exam Vitals signs and nursing note reviewed.  Constitutional:      Appearance: He is well-developed. He is not ill-appearing or toxic-appearing.  HENT:     Head: Normocephalic and atraumatic.     Right Ear: External ear normal.     Left Ear: External ear normal.  Eyes:     General: No scleral icterus.       Right eye: No discharge.        Left eye: No discharge.     Conjunctiva/sclera: Conjunctivae normal.  Neck:     Musculoskeletal: Neck supple.     Trachea: No tracheal deviation.  Cardiovascular:     Rate and Rhythm: Normal rate and regular rhythm.  Pulmonary:     Effort: Pulmonary effort is normal. No respiratory distress.     Breath sounds: Normal breath sounds. No stridor. No wheezing or rales.  Abdominal:     General: Bowel sounds are normal. There is no distension.     Palpations: Abdomen is soft.     Tenderness: There is no abdominal tenderness. There is no guarding or rebound.  Musculoskeletal:        General: No tenderness.  Skin:    General: Skin is warm and dry.     Findings: No rash.  Neurological:     Mental Status: He is alert.     Cranial Nerves: No cranial nerve deficit (no facial droop, extraocular movements intact, no slurred speech).     Sensory: No sensory deficit.     Motor: No abnormal muscle tone or seizure activity.     Coordination: Coordination normal.      ED Treatments / Results  Labs (all labs ordered are listed, but only abnormal results are displayed) Labs Reviewed  URINALYSIS, ROUTINE W REFLEX MICROSCOPIC - Abnormal; Notable for the following components:       Result Value   Specific Gravity, Urine 1.004 (*)    Hgb urine dipstick MODERATE (*)    RBC / HPF >50 (*)    All other components within normal limits  CBC - Abnormal; Notable for the following components:   RBC 3.84 (*)    Hemoglobin 9.3 (*)    HCT 30.0 (*)    MCV 78.1 (*)    MCH 24.2 (*)    RDW 22.4 (*)    Platelets 115 (*)    All other components within normal limits  URINE CULTURE  BASIC METABOLIC PANEL  PROTIME-INR    EKG None  Radiology No results found.  Procedures Procedures (including critical care time)  Medications Ordered in ED Medications - No data to display   Initial Impression / Assessment and Plan / ED Course  I have reviewed the triage vital signs and the nursing notes.  Pertinent labs & imaging results that were available during my care of the patient were reviewed by me and considered in my medical decision making (see chart for details).  Clinical Course as of May 31 1139  Sat May 31, 2019  1102 Labs reviewed.  Hemoglobin stable.  Electrolyte panel unremarkable.  Hematuria noted on UA   [JK]    Clinical Course User Index [JK] Dorie Rank, MD     ED work-up is reassuring.  Hemoglobin is stable and electrolyte panel is unremarkable.  Urinalysis does not suggest infection.  Patient states his hematuria is resolving and appears to be clearing.  He is not having any symptoms to suggest urinary retention.  Patient will require outpatient urology follow-up.  I discussed the several possibilities that can cause this type of hematuria including some type of lesion in the bladder such as a bladder malignancy.  Patient and family understand the importance of close follow-up.  Discussed doing some potential imaging in the ED to aid in outpatient work-up.  Family states he has some outpatient test scheduled already.  They are comfortable following up with urology as an outpatient to determine further testing at that time.  Explained to patient return to ED if  he started having significant bleeding or difficulty urinating.  [facility] suggested he monitor for fevers or chills Final Clinical Impressions(s) / ED Diagnoses   Final diagnoses:  Gross hematuria    ED Discharge Orders    None       Dorie Rank, MD 05/31/19 1140

## 2019-06-01 LAB — URINE CULTURE: Culture: <10000 — AB

## 2019-06-05 ENCOUNTER — Telehealth: Payer: Self-pay

## 2019-06-05 NOTE — Telephone Encounter (Signed)
RN received call from Marella Chimes, PA-C @ [facility] regarding lab work showing M-Spike.   RN requested labs faxed to clinic.  MD reviewed, recommendations to keep follow up scheduled for 10/27.  RN notified patient's daughter, voiced understanding and will keep follow up appointment as scheduled.

## 2019-06-06 ENCOUNTER — Telehealth: Payer: Self-pay

## 2019-06-06 NOTE — Telephone Encounter (Signed)
It is okay to hold aspirin and Plavix.  The structural heart team prefers the patient's remain on dual antiplatelet therapy for 6 months before discontinuation.  In their last note they did comment that DAPT could be temporarily held for his shoulder surgery after 6 weeks of therapy following TAVR.  Therefore I believe it is okay to transiently hold DAPT for his colonoscopy and then resume as soon as possible thereafter

## 2019-06-06 NOTE — Telephone Encounter (Signed)
   [facility] Pre-operative Risk Assessment    Request for surgical clearance:  1. What type of surgery is being performed? Endoscopy   2. When is this surgery scheduled?  06/12/19   3. What type of clearance is required (medical clearance vs. Pharmacy clearance to hold med vs. Both)? Pharmacy  4. Are there any medications that need to be held prior to surgery and how long? Plavix   5. Practice name and name of physician performing surgery? [facility]   6. What is your office phone number [phone]    7.   What is your office fax number [phone]  8.   Anesthesia type (None, local, MAC, general) ? None listed   Mady Haagensen 06/06/2019, 10:04 AM  _________________________________________________________________   (provider comments below)

## 2019-06-06 NOTE — Telephone Encounter (Signed)
[facility] called to expedite the clearance request for this patient. He would need to be off his Plavix for 5 days, which would have to start tomorrow, 10/03. Please let [facility] know the office's decision

## 2019-06-06 NOTE — Telephone Encounter (Signed)
Called Adrian at [facility] x 2.  Left VM.  Called pt and made him aware of recommendations.  Pt verbalized understanding and was appreciative for call. Fax sent to [facility].

## 2019-06-06 NOTE — Telephone Encounter (Signed)
No pre op team today.  Will route to DOD to address.

## 2019-06-09 ENCOUNTER — Encounter (HOSPITAL_COMMUNITY): Payer: Self-pay | Admitting: *Deleted

## 2019-06-09 ENCOUNTER — Other Ambulatory Visit: Payer: Self-pay

## 2019-06-09 ENCOUNTER — Other Ambulatory Visit (HOSPITAL_COMMUNITY)
Admission: RE | Admit: 2019-06-09 | Discharge: 2019-06-09 | Disposition: A | Discharge status: Home / Self Care | Payer: Medicare Other | Source: Ambulatory Visit | Attending: Gastroenterology | Admitting: Gastroenterology

## 2019-06-09 DIAGNOSIS — Z20828 Contact with and (suspected) exposure to other viral communicable diseases: Secondary | ICD-10-CM | POA: Insufficient documentation

## 2019-06-09 DIAGNOSIS — Z01812 Encounter for preprocedural laboratory examination: Secondary | ICD-10-CM | POA: Diagnosis present

## 2019-06-09 DIAGNOSIS — K3189 Other diseases of stomach and duodenum: Secondary | ICD-10-CM | POA: Insufficient documentation

## 2019-06-09 NOTE — Progress Notes (Addendum)
PCP - Jenny Reichmann griffin Cardiologist -  Dr Golden Hurter, lov   Chest x-ray - 04-28-19 epic EKG - 04-29-19 epic Stress Test -  ECHO -  Cardiac Cath - 04-02-19 epic  aortic valve replacement 2009 and aortic valve in valve replacment 04-15-19 epic  Sleep Study - none CPAP - none  Fasting Blood Sugar - n/a Checks Blood Sugar _____ times a day  Blood Thinner Instructions: stop plavix 5 days prior per dr Radford Pax (spoke with daughter patty ) Aspirin Instructions: sta on aspirin per dr Radford Pax (spoke with patient daughter patty) Last Dose:  Anesthesia review: patient ok for endoscopy per Janett Billow zanetto pa  Patient denies shortness of breath, fever, cough and chest pain at PAT appointment   Patient verbalized understanding of instructions that were given to them at the PAT appointment. Patient was also instructed that they will need to review over the PAT instructions again at home before surgery.

## 2019-06-10 LAB — NOVEL CORONAVIRUS, NAA (HOSP ORDER, SEND-OUT TO REF LAB; TAT 18-24 HRS): SARS-CoV-2, NAA: NOT DETECTED

## 2019-06-11 NOTE — Anesthesia Preprocedure Evaluation (Addendum)
Anesthesia Evaluation  Patient identified by MRN, date of birth, ID band Patient awake    Reviewed: Allergy & Precautions, H&P , NPO status , Patient's Chart, lab work & pertinent test results  Airway Mallampati: I  TM Distance: >3 FB Neck ROM: Full    Dental no notable dental hx. (+) Teeth Intact, Dental Advisory Given   Pulmonary former smoker,    Pulmonary exam normal breath sounds clear to auscultation       Cardiovascular hypertension, Pt. on home beta blockers Normal cardiovascular exam+ Valvular Problems/Murmurs AS and MR  Rhythm:Regular Rate:Normal  ECG: SR, rate 64. LAFB  S/p AVR  ECHO:  1. The left ventricle has normal systolic function with an ejection fraction of 60-65%. The cavity size was normal. Left ventricular diastolic Doppler parameters are indeterminate.  2. The right ventricle has normal systolic function. The cavity was normal.  3. Left atrial size was mildly dilated.  4. The mitral valve is abnormal. Mild thickening of the mitral valve leaflet. There is mild mitral annular calcification present.  5. The tricuspid valve is grossly normal.  6. The aorta is normal in size and structure.  7. The interatrial septum is aneurysmal.  8. Normal LV systolic function; s/p TAVR with mean gradient of 14 mmHg and no AI; mild MR; mild LAE; mild TR.   Neuro/Psych negative neurological ROS  negative psych ROS   GI/Hepatic Neg liver ROS, GERD  Medicated and Controlled,  Endo/Other  negative endocrine ROS  Renal/GU negative Renal ROS     Musculoskeletal  (+) Arthritis , Rheumatoid disorders,    Abdominal   Peds  Hematology  (+) Blood dyscrasia, anemia ,   Anesthesia Other Findings   He is scheduled for an EGD with botox injection on 06/12/2019 at [facility] and has been advised to stop plavix for 5 days prior to the procedure.  Reproductive/Obstetrics                            Anesthesia  Physical  Anesthesia Plan  ASA: III  Anesthesia Plan: MAC   Post-op Pain Management:    Induction: Intravenous  PONV Risk Score and Plan: 1 and Propofol infusion and Treatment may vary due to age or medical condition  Airway Management Planned: Nasal Cannula  Additional Equipment:   Intra-op Plan:   Post-operative Plan:   Informed Consent: I have reviewed the patients History and Physical, chart, labs and discussed the procedure including the risks, benefits and alternatives for the proposed anesthesia with the patient or authorized representative who has indicated his/her understanding and acceptance.     Dental advisory given  Plan Discussed with: CRNA  Anesthesia Plan Comments:         Anesthesia Quick Evaluation

## 2019-06-11 NOTE — H&P (Signed)
History of Present Illness  General:  This was an audio only visit that started at 1:55 pm until 2:03pm. 84/male with achalasia(based on prior EGD and manometry studies) has been referred to [facility] for consideration for POEMS and has an upcoming EGD with botox injection at [facility] on 06/12/2019, needs to hold plavix for 5 days prior to procedure.He has a virtual visit at [facility] with [name] on 06/06/2019. EGD with savory dilatation and 14 MM on 04/30/2019. Barium swallow dating back to 2009 features of esophageal motility. He has undergone savory dilatation with 13,14, 15 MM in 02/27/2014. He has also undergone EGD with balloon dilatation with 20 mm balloon in 10/2018. EGD was performed on 01/22/2018 with balloon dilatation of distal benign stenosis with 20 MM balloon and 100units of Botox injection, 25 units per ML, one ML in 4 quadrants, one centimeter above GE junction. Patient had an esophageal manometry performed on 11/07/2017 which showed EG junction outflow obstruction with one instance of failed peristalsis, not diagnostic of achalasia, elevated LES resting and relaxation pressure with panesophageal pressurization and incomplete bolus clearance. Last colonoscopy was from 2009 for iron deficiency anemia. And was noted to have 3 AVMs in the cecum and moderate internal hemorrhoids. He had a heart valve replacement 04/15/2019, transvenous and has been started clopidogrel and continues to be on ASA 81mg . His weight on 05/13/2019 was reported to be 96.8. Labs from 05/07/2019 showed anemia, HB around 10, low MCV, otherwise normal CMP. He is doing well. He is still having problem with swallowing. He may have gained a couple of pouds. He weighed 96.5 lbs. He eats mostly eating soup and mashed potatoes, bread causing problem, if he eats hamburgers and steak with a lot of water or fluids. he has regular Bms, denies blood in stool or black stools. He had a lot of blood in his urine 6 days ago, he was diagnosed as having  gross hematuria, he was referred to a urologist. Since then his urine has been clear?possibly passed a kidney stone.Denies abdominal pain, nausea or vomiting. He is scheduled for an EGD with botox injection on 06/12/2019 at 10:30 am, advised to hold plavix 5 days prior to procedure 06/07/2019.   Current Medications  Taking   Clear Eyes for Dry Eyes(Carboxymethylcellul-Glycerin) 1-0.25 % Solution 1 drop into both eyes as needed for dry eyes Ophthalmic three times a day, Notes: Listed in Epic   Clopidogrel Bisulfate 75 MG Tablet 1 tablet Orally Once a day, Notes: Hospital   Ensure Enlive - Liquid 237 ml between meals Orally three times a day, Notes: Hospital   Acetaminophen-500 mg 500 mg Tablets 1-2 tablets orally every 6 hours as needed for pain   Aspirin EC 81 MG Tablet Delayed Release 1 tablet Orally daily   Folic Acid 1 Tablet 1 tablet Orally Once a day   Furosemide 40 MG Tablet 1 tablet Orally every morning   Krill Oil - Capsule 1 capsule Orally once a day   Metoprolol Succinate 25MG  tablet 1 tablet Orally Once a day   Multivitamins Tablet 1 tablet Orally once a day   Oxycodone HCl 5 MG Tablet 1 tablet as needed for severe pain or breakthrough pain Orally every 4 hrs, Notes: Listed in Epic   Pantoprazole Sodium 40 MG Tablet Delayed Release 1 tablet Orally Once a day   Potassium Chloride ER 10 MEQ Tablet Extended Release 1 tablet Orally every morning   PredniSONE 5MG  Tablet 1 tablet with breakfast Orally Once a day  Tamsulosin HCl 0.4 MG Capsule 2 capsules Orally Once a day about 30 minutes after dinner   Turmeric 500 MG Capsule 1 capsule Orally once a day   Vitamin B-12 1000 MCG Tablet 1 tablet Orally Once a day   Vitamin D3 1000 UNIT Capsule 1 capsule Orally Once a day   Discontinued   Ferrous Sulfate 325 (65 Fe) MG Tablet 1 tablet with breakfast Orally Once a day, Notes: Listed in Epic- Holding   Theratears(Carboxymethylcellulose Sodium) 0.25 % Solution 1 drop into both as needed for  dry/irritated eyes Ophthalmic three times a day, Notes: LIsted in Epic   Medication List reviewed and reconciled with the patient    Past Medical History  severe aortic stenosis status post prosthetic valve replacement with 25 mm Edwards like science pericardial tissue valve, [facility].   rheumatoid arthritis-s/p long term steroids, PLQ--d/c ? eye toxicity, MTX, [place].   atrial fibrillation postoperatively without recurrence, [facility].   Degenerative joint disease of the lumbosacral spine.   Diverticulosis.   Occipital neuralgia.   Colon polyps, last colonoscopy in 2004.   Status post diverticular perforation requiring 2-stage repair.   SBE prophylaxis dental procedures.   Erectile dysfunction.   Herpes keratitis os 12/15 [facility].   esophageal stricture, EGD 2015.   bronchiectasis on CT scan, asymptomatic, 2015.   right cervical radiculopathy, December 2015.   cecal AVMs.   Osteopenia.   Chronic microcytic anemia, likely anemia chronic disease.   Edyth Gunnels, cardiology [facility], rheumatology [facility], [facility], pulm clance(released), ennever oncology heme [facility].   Achalasia, 2020 K[facility] Stahl.    Surgical History  C3-C4 cervical fusion 1995  appendectomy   tonsillectomy   TURP 1991  cystoscopy with laser incision of the bladder neck 1992  kidney stone removal 1997  surgery on cervical spine and right a.c. joint   temporary colostomy following colonic perforation from diverticular perforation with re-anastomosis 2002  hernia repair, [place] 2005  aortic valve replacement with prosthetic aortic valve (Edwards like science pericardial tissue valve) 5/09  colonoscopy/endoscopy 2015  endoscopy 10/25/17  Right reverse shoulder arthroplasty- Dr. Veverly Fells 02/2018  irrigation and debridement right shoulder, Norris March 2020  r shoulder implant removal, debridement and irrigation, antibiotic spacer, norris 4/20  transcatheter aortic valve replacement, owen and cooper 8/20   Family  History  Father: deceased 63 yrs, MI, rheumatoid arthritis  Mother: deceased 40 yrs, Dementia  Brother 1: deceased, suicide  Brother2: alive  Brother 3: alive  Sister 1: deceased, Status post cardiac transplant from automobile accident  Sister 2: deceased, diagnosed with Diabetes  Sister 3: alive  Daughter(s): Rheumatoid arthritis  3 brother(s) , 5 sister(s) . 2daughter(s) - healthy.   No Family History of Colon Cancer, Polyps, or Liver Disease.   Social History  General:  Alcohol: yes, Rare.  Tobacco use  cigarettes: Former smoker Quit in year 1995 smoked a couple cigs/day for 8-10 years Pack-year Hx: 2 Additional Findings: Tobacco Non-User Ex-light cigarette smoker (1-9/day) Tobacco history last updated 05/13/2019 Marital Status: Married.  no Recreational drug use.  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  Exercise: walk almost daily 1 or more miles.    Allergies  N.K.D.A.   Hospitalization/Major Diagnostic Procedure  aortic valve replacement 04/2019  not in the past year 05/2019   Review of Systems  GI PROCEDURE:  no Pacemaker/ AICD, no. Artificial heart valves YES. no MI/heart attack. no Abnormal heart rhythm. no Angina. no CVA. no Hypertension. no Hypotension. no Asthma, COPD. no Sleep  apnea. no Seizure disorders. Artificial joints YES, right shoulder replacement. no Severe DJD. no Diabetes. no Significant headaches. no Vertigo. no Depression/anxiety. no Abnormal bleeding. no Kidney Disease. no Liver disease, no. Blood transfusion yes, when he got a heart valve replacement 04/2019.       Vital Signs  Wt 96.8 per pt, Wt change -8 lb, Ht 61.5, BMI 17.99.   Examination  Gastroenterology:: RESPIRATORY able to speak in full sentences.  NEURO: alert,oriented to time,place and person.  This was an audio only visit.    Assessments   1. Achalasia - K22.0 (Primary)   Treatment  1. Achalasia  Notes: He is scheduled for an EGD with botox  injection on 06/12/2019 at [facility] and has been advised to stop plavix for 5 days prior to the procedure. He has a vitual visit with Dr.Crock from [facility] as I belive he will benefit from myotomy as EGD with dilation or botox are not helping him much and he has lost a lot of weight, currently weighs 96 lbs only.    Ronnette Juniper, MD

## 2019-06-11 NOTE — Progress Notes (Signed)
Talked with patient's daughter regarding procedure for tomorrow. She stated he had been able to quarantine and he has not had any fevers or flu like symptoms. We moved patients procedure up to 830 with arrival time at 0700. Pts daughter agreed with new time of procedure.

## 2019-06-12 ENCOUNTER — Ambulatory Visit (HOSPITAL_COMMUNITY): Payer: Medicare Other | Admitting: Physician Assistant

## 2019-06-12 ENCOUNTER — Encounter (HOSPITAL_COMMUNITY): Payer: Self-pay | Admitting: Gastroenterology

## 2019-06-12 ENCOUNTER — Other Ambulatory Visit: Payer: Self-pay

## 2019-06-12 ENCOUNTER — Ambulatory Visit (HOSPITAL_COMMUNITY)
Admission: RE | Admit: 2019-06-12 | Discharge: 2019-06-12 | Disposition: A | Discharge status: Home / Self Care | Payer: Medicare Other | Attending: Gastroenterology | Admitting: Gastroenterology

## 2019-06-12 ENCOUNTER — Encounter (HOSPITAL_COMMUNITY)
Admission: RE | Disposition: A | Discharge status: Home / Self Care | Payer: Self-pay | Source: Home / Self Care | Attending: Gastroenterology

## 2019-06-12 DIAGNOSIS — I10 Essential (primary) hypertension: Secondary | ICD-10-CM | POA: Insufficient documentation

## 2019-06-12 DIAGNOSIS — Z7952 Long term (current) use of systemic steroids: Secondary | ICD-10-CM | POA: Diagnosis not present

## 2019-06-12 DIAGNOSIS — K220 Achalasia of cardia: Secondary | ICD-10-CM | POA: Diagnosis not present

## 2019-06-12 DIAGNOSIS — K222 Esophageal obstruction: Secondary | ICD-10-CM | POA: Diagnosis not present

## 2019-06-12 DIAGNOSIS — Z87891 Personal history of nicotine dependence: Secondary | ICD-10-CM | POA: Diagnosis not present

## 2019-06-12 DIAGNOSIS — M069 Rheumatoid arthritis, unspecified: Secondary | ICD-10-CM | POA: Diagnosis not present

## 2019-06-12 DIAGNOSIS — K219 Gastro-esophageal reflux disease without esophagitis: Secondary | ICD-10-CM | POA: Insufficient documentation

## 2019-06-12 DIAGNOSIS — Z7982 Long term (current) use of aspirin: Secondary | ICD-10-CM | POA: Insufficient documentation

## 2019-06-12 DIAGNOSIS — Z79899 Other long term (current) drug therapy: Secondary | ICD-10-CM | POA: Insufficient documentation

## 2019-06-12 DIAGNOSIS — Z952 Presence of prosthetic heart valve: Secondary | ICD-10-CM | POA: Diagnosis not present

## 2019-06-12 HISTORY — PX: SUBMUCOSAL INJECTION: SHX5543

## 2019-06-12 HISTORY — DX: Achalasia of cardia: K22.0

## 2019-06-12 HISTORY — DX: Joint derangement, unspecified: M24.9

## 2019-06-12 HISTORY — PX: BALLOON DILATION: SHX5330

## 2019-06-12 HISTORY — DX: Dysphagia, unspecified: R13.10

## 2019-06-12 HISTORY — PX: ESOPHAGOGASTRODUODENOSCOPY (EGD) WITH PROPOFOL: SHX5813

## 2019-06-12 HISTORY — DX: Esophageal obstruction: K22.2

## 2019-06-12 SURGERY — ESOPHAGOGASTRODUODENOSCOPY (EGD) WITH PROPOFOL
Anesthesia: Monitor Anesthesia Care

## 2019-06-12 MED ORDER — PROPOFOL 10 MG/ML IV BOLUS
INTRAVENOUS | Status: DC | PRN
  Administered 2019-06-12 (×2): 20 mg via INTRAVENOUS

## 2019-06-12 MED ORDER — LIDOCAINE 2% (20 MG/ML) 5 ML SYRINGE
INTRAMUSCULAR | Status: DC | PRN
  Administered 2019-06-12: 40 mg via INTRAVENOUS

## 2019-06-12 MED ORDER — SODIUM CHLORIDE 0.9 % IV SOLN: INTRAVENOUS | Status: DC

## 2019-06-12 MED ORDER — LACTATED RINGERS IV SOLN
INTRAVENOUS | Status: DC
  Administered 2019-06-12: 08:00:00 via INTRAVENOUS

## 2019-06-12 MED ORDER — PROPOFOL 10 MG/ML IV BOLUS
INTRAVENOUS | Status: AC
  Filled 2019-06-12: qty 40

## 2019-06-12 MED ORDER — SODIUM CHLORIDE (PF) 0.9 % IJ SOLN
INTRAMUSCULAR | Status: DC | PRN
  Administered 2019-06-12: 4 mL via SUBMUCOSAL

## 2019-06-12 MED ORDER — ONABOTULINUMTOXINA 100 UNITS IJ SOLR
INTRAMUSCULAR | Status: AC
  Filled 2019-06-12: qty 100

## 2019-06-12 MED ORDER — SODIUM CHLORIDE (PF) 0.9 % IJ SOLN
INTRAMUSCULAR | Status: AC
  Filled 2019-06-12: qty 10

## 2019-06-12 MED ORDER — PROPOFOL 500 MG/50ML IV EMUL
INTRAVENOUS | Status: DC | PRN
  Administered 2019-06-12: 100 ug/kg/min via INTRAVENOUS

## 2019-06-12 SURGICAL SUPPLY — 15 items

## 2019-06-12 NOTE — Transfer of Care (Signed)
Immediate Anesthesia Transfer of Care Note  Patient: Joshua Hudson  Procedure(s) Performed: ESOPHAGOGASTRODUODENOSCOPY (EGD) WITH PROPOFOL (N/A ) BALLOON DILATION (N/A ) SUBMUCOSAL INJECTION  Patient Location: PACU and Endoscopy Unit  Anesthesia Type:MAC  Level of Consciousness: awake, alert  and oriented  Airway & Oxygen Therapy: Patient Spontanous Breathing and Patient connected to nasal cannula oxygen  Post-op Assessment: Report given to RN and Post -op Vital signs reviewed and stable  Post vital signs: Reviewed and stable  Last Vitals:  Vitals Value Taken Time  BP    Temp    Pulse 62 06/12/19 0850  Resp 19 06/12/19 0850  SpO2 100 % 06/12/19 0850  Vitals shown include unvalidated device data.  Last Pain:  Vitals:   06/12/19 0700  TempSrc: Oral  PainSc: 8          Complications: No apparent anesthesia complications

## 2019-06-12 NOTE — Anesthesia Postprocedure Evaluation (Signed)
Anesthesia Post Note  Patient: Joshua Hudson  Procedure(s) Performed: ESOPHAGOGASTRODUODENOSCOPY (EGD) WITH PROPOFOL (N/A ) BALLOON DILATION (N/A ) SUBMUCOSAL INJECTION     Patient location during evaluation: PACU Anesthesia Type: MAC Level of consciousness: awake and alert Pain management: pain level controlled Vital Signs Assessment: post-procedure vital signs reviewed and stable Respiratory status: spontaneous breathing, nonlabored ventilation, respiratory function stable and patient connected to nasal cannula oxygen Cardiovascular status: stable and blood pressure returned to baseline Postop Assessment: no apparent nausea or vomiting Anesthetic complications: no    Last Vitals:  Vitals:   06/12/19 0850 06/12/19 0900  BP: (!) 132/56 (!) 156/62  Pulse: 62 61  Resp: 18 16  Temp: (!) 36.4 C   SpO2: 100% 95%    Last Pain:  Vitals:   06/12/19 0900  TempSrc:   PainSc: 0-No pain                 Jameson Morrow

## 2019-06-12 NOTE — Interval H&P Note (Signed)
History and Physical Interval Note: 84/male suspected achalasia for an EGD with botox injection, plavix has been on hold for 5 days.  06/12/2019 8:19 AM  Joshua Hudson  has presented today for EGD with botox, with the diagnosis of achalasia.  The various methods of treatment have been discussed with the patient and family. After consideration of risks, benefits and other options for treatment, the patient has consented to  Procedure(s) with comments: ESOPHAGOGASTRODUODENOSCOPY (EGD) WITH PROPOFOL (N/A) - with botox injection as a surgical intervention.  The patient's history has been reviewed, patient examined, no change in status, stable for surgery.  I have reviewed the patient's chart and labs.  Questions were answered to the patient's satisfaction.     Ronnette Juniper

## 2019-06-12 NOTE — Discharge Instructions (Signed)

## 2019-06-12 NOTE — Op Note (Signed)
[facility] Patient Name: Joshua Hudson Procedure Date: 06/12/2019 MRN: [id number] Attending MD: Ronnette Juniper , MD Date of Birth: 03/03/1935 CSN: [id number] Age: 84 Admit Type: Outpatient Procedure:                Upper GI endoscopy Indications:              Therapeutic procedure, Dysphagia, For botulinum                            toxin injection of achalasia Providers:                Ronnette Juniper, MD, Cleda Daub, RN, Marguerita Merles,                            Technician CRNA Jefm Miles Referring MD:              Medicines:                Monitored Anesthesia Care Complications:            No immediate complications. Estimated blood loss:                            Minimal. Estimated Blood Loss:     Estimated blood loss was minimal. Procedure:                Pre-Anesthesia Assessment:                           - Prior to the procedure, a History and Physical                            was performed, and patient medications and                            allergies were reviewed. The patient's tolerance of                            previous anesthesia was also reviewed. The risks                            and benefits of the procedure and the sedation                            options and risks were discussed with the patient.                            All questions were answered, and informed consent                            was obtained. Prior Anticoagulants: The patient has                            taken Plavix (clopidogrel), last dose was 5 days  prior to procedure. ASA Grade Assessment: III - A                            patient with severe systemic disease. After                            reviewing the risks and benefits, the patient was                            deemed in satisfactory condition to undergo the                            procedure.                           After obtaining informed consent, the endoscope was                             passed under direct vision. Throughout the                            procedure, the patient's blood pressure, pulse, and                            oxygen saturations were monitored continuously. The                            [id number]-[id number]) Olympus gastroscope was                            introduced through the mouth, and advanced to the                            second part of duodenum. The upper GI endoscopy was                            accomplished without difficulty. The patient                            tolerated the procedure well. Scope In: Scope Out: Findings:      A low-grade of narrowing Schatzki ring was found at the gastroesophageal       junction. A TTS dilator was passed through the scope. Dilation with an       18-19-20 mm x 8 cm CRE balloon dilator was performed to 20 mm for 2       consecutive minutes. The dilation site was examined following endoscope       reinsertion and showed mild mucosal disruption.      A hypertonic lower esophageal sphincter was found. There was mild       resistance to endoscope advancement into the stomach. The Z-line was       regular. Area was successfully injected with 100 units botulinum toxin,       25 units/ml in "4 quadrant fashion", 1 cm above GE junction.      The entire examined stomach was  normal.      The cardia and gastric fundus were normal on retroflexion.      The examined duodenum was normal, although mild looping was encountered       during passage of scope from bulb to first portion of duodenum. Impression:               - Low-grade of narrowing Schatzki ring. Dilated.                           - The esophageal examination was consistent with                            achalasia. Injected with botulinum toxin.                           - Normal stomach.                           - Normal examined duodenum.                           - No specimens collected. Moderate Sedation:       Patient did not receive moderate sedation for this procedure, but       instead received monitored anesthesia care. Recommendation:           - Patient has a contact number available for                            emergencies. The signs and symptoms of potential                            delayed complications were discussed with the                            patient. Return to normal activities tomorrow.                            Written discharge instructions were provided to the                            patient.                           - Resume regular diet.                           - Continue present medications.                           - Repeat upper endoscopy depending on patient's                            symptoms for retreatment. Procedure Code(s):        --- Professional ---                           [phone], Esophagogastroduodenoscopy, flexible,  transoral; with transendoscopic balloon dilation of                            esophagus (less than 30 mm diameter)                           43236, 59, Esophagogastroduodenoscopy, flexible,                            transoral; with directed submucosal injection(s),                            any substance Diagnosis Code(s):        --- Professional ---                           K22.2, Esophageal obstruction                           R13.10, Dysphagia, unspecified                           K22.0, Achalasia of cardia CPT copyright 2019 American Medical Association. All rights reserved. The codes documented in this report are preliminary and upon coder review may  be revised to meet current compliance requirements. Ronnette Juniper, MD 06/12/2019 8:49:03 AM This report has been signed electronically. Number of Addenda: 0

## 2019-06-12 NOTE — Brief Op Note (Signed)
06/12/2019  8:49 AM  PATIENT:  Joshua Hudson  84 y.o. male  PRE-OPERATIVE DIAGNOSIS:  esophageal stricture  POST-OPERATIVE DIAGNOSIS:  esophageal dilation,botox injection  PROCEDURE:  Procedure(s) with comments: ESOPHAGOGASTRODUODENOSCOPY (EGD) WITH PROPOFOL (N/A) - with botox injection BALLOON DILATION (N/A) SUBMUCOSAL INJECTION  SURGEON:  Surgeon(s) and Role:    Ronnette Juniper, MD - Primary  PHYSICIAN ASSISTANT:   ASSISTANTS: Lilli Few, Tereasa Coop, Tech  ANESTHESIA:   MAC  EBL:  Minimal  BLOOD ADMINISTERED:none  DRAINS: none   LOCAL MEDICATIONS USED:  NONE  SPECIMEN:  No Specimen  DISPOSITION OF SPECIMEN:  N/A  COUNTS:  YES  TOURNIQUET:  * No tourniquets in log *  DICTATION: .Dragon Dictation  PLAN OF CARE: Discharge to home after PACU  PATIENT DISPOSITION:  PACU - hemodynamically stable.   Delay start of Pharmacological VTE agent (>24hrs) due to surgical blood loss or risk of bleeding: no

## 2019-06-12 NOTE — Anesthesia Procedure Notes (Signed)
Procedure Name: MAC Date/Time: 06/12/2019 8:26 AM Performed by: Eben Burow, CRNA Pre-anesthesia Checklist: Patient identified, Emergency Drugs available, Suction available, Patient being monitored and Timeout performed Oxygen Delivery Method: Nasal cannula Dental Injury: Teeth and Oropharynx as per pre-operative assessment

## 2019-06-13 ENCOUNTER — Encounter (HOSPITAL_COMMUNITY): Payer: Self-pay | Admitting: Gastroenterology

## 2019-06-18 ENCOUNTER — Telehealth: Payer: Self-pay | Admitting: *Deleted

## 2019-06-18 NOTE — Telephone Encounter (Signed)
Patient has followup next week, please change description of followup to preop clearance.   Patient underwent TAVR by Dr. Roxy Manns and Dr. Burt Knack on 04/15/2019. Pre TAVR cath showed minimal CAD. Will verify with Dr. Burt Knack the earliest time the patient can start holding plavix after TAVR

## 2019-06-18 NOTE — Telephone Encounter (Signed)
   [facility] Pre-operative Risk Assessment    Request for surgical clearance:  1. What type of surgery is being performed? RIGHT SHOULDER REMOVAL OF ANTIBIOTIC SPACER AND REVERSE TOTAL SHOULDER   2. When is this surgery scheduled? 07/18/19   3. What type of clearance is required (medical clearance vs. Pharmacy clearance to hold med vs. Both)? MEDICAL  4. Are there any medications that need to be held prior to surgery and how long? PLAVIX AND ASA   5. Practice name and name of physician performing surgery? [facility]; DR. Remo Lipps NORRIS   6. What is your office phone number [phone]    7.   What is your office fax number [phone]  8.   Anesthesia type (None, local, MAC, general) ? GENERAL   Julaine Hua 06/18/2019, 1:43 PM  _________________________________________________________________   (provider comments below)

## 2019-06-18 NOTE — Telephone Encounter (Signed)
Patient is cleared to hold plavix. He will be out 3 months from the procedure. thanks

## 2019-06-23 ENCOUNTER — Telehealth: Payer: Self-pay

## 2019-06-23 NOTE — Progress Notes (Signed)
Virtual Visit via Telephone Note   This visit type was conducted due to national recommendations for restrictions regarding the COVID-19 Pandemic (e.g. social distancing) in an effort to limit this patient's exposure and mitigate transmission in our community.  Due to his co-morbid illnesses, this patient is at least at moderate risk for complications without adequate follow up.  This format is felt to be most appropriate for this patient at this time.  All issues noted in this document were discussed and addressed.  A limited physical exam was performed with this format.  Please refer to the patient's chart for his consent to telehealth for [facility].   Evaluation Performed:  Follow-up visit  This visit type was conducted due to national recommendations for restrictions regarding the COVID-19 Pandemic (e.g. social distancing).  This format is felt to be most appropriate for this patient at this time.  All issues noted in this document were discussed and addressed.  No physical exam was performed (except for noted visual exam findings with Video Visits).  Please refer to the patient's chart (MyChart message for video visits and phone note for telephone visits) for the patient's consent to telehealth for [facility].  Date:  06/23/2019   ID:  Joshua Hudson, DOB 17-Nov-1934, MRN [id number]  Patient Location:  Home  Provider location:   [place]  PCP:  Lavone Orn, MD  Cardiologist:  [name], MD  Electrophysiologist:  None   Chief Complaint:  AS, afib, CAD  History of Present Illness:    Joshua Hudson is a 84 y.o. male who presents via audio/video conferencing for a telehealth visit today.    Joshua Hudson is a 84 y.o. male with a history of severe AS s/p AVR with a 38mm Edwards bioprosthetic valve in 2009 with post op afib (withno recurrence), HTN, rheumatoid arthritis on MTX, leflunomide and prednisone, achalasia, mod MR,andchronic diastolic CHF, hemolytic  anemia,bioprosthetic valve failure with severe AIs/p valve-in-valve TAVR (04/15/19).  Patient underwent aortic valve replacement using a 25 mm Edwards magna stented bovine pericardial tissue valve by Dr. Servando Snare in 2009 for severe symptomatic aortic stenosis. His early postoperative recovery was notable for postoperative atrial fibrillation which has not recurred. He has done well from a cardiac standpoint but echo performed in January of this year revealed normal left ventricular systolic function and mild aortic insufficiency.he subsequently developed relatively acute onset of shortness of breath, chest discomfort, orthopnea, and lower extremity edema. Initially symptoms were primarily with physical exertion but symptoms  progressed fairly rapidly.  He has had problems with his right shoulder and underwent right shoulder replacement more than 1 year ago. He has had multiple problems ever since with an attempt at revision of his shoulder arthroplasty which subsequently became infected, requiring removal of the hardware and placement of temporary plastic hardware. He has been evaluated for redo shoulder replacement in the future.He was seen by Havery Moros in the office on 03/18/2019 for preoperative clearance for redo shoulder arthroplasty. He was noted to have significant lower extremity edema as well as other signs and symptoms of acute heart failure.Follow-up TTE on 03/18/2019 revealed normal left ventricular systolic function with at least moderate AI. Subsequent TEE on 04/02/2019 confirmed the presence of severe prosthetic valve dysfunction with severe AI. There was no vegetation on the aortic valve she suggest endocarditis. Cardiac catheterization on 04/02/2019 showed normal coronaries.He was also noted to have worsening anemia. Haptoglobin was low c/w hemolytic anemia.  He underwentsuccessfulvalve in valveTAVR with a64mm Oletta Lamas  Sapien UltraTHV via the TF approach on 04/15/19.  Post operative echoshowed EF 60-65%, normally functioning TAVR with mean gradient of 14 mmHg and no AI.He was transfused 2 U PRBCs for hemolytic anemia felt to be related to his severe AI. He also developed post op thrombocytopeniawith platelets down to 46K.He was discharged on POD1 on aspirin and plavix.  At post surgical follow up he was feeling poorly. He had continued shortness of breath and fatigue. He had not been sleeping well and still with significant LE edema. Follow up lab work showed stable hg ~9 and platelets improved to 74,000. Creat and electrolytes were okay but NT pro BNP was elevated ~1500. His diuretics were adjusted from Lasix 40mg  daily to 40mg  BID x 3 days.   The patient continued to do poorly withback/shoulderpain, weakness, dysphagia and failure to thrive with progressive weight loss (10 lbs over 1 week)and presented to the ER on 8/24. He was readmitted 8/24-8/28/20 and diagnosed with dysphagia 2/2 achalasia and underwent esophageal dilation. He also had AKI and worsening anemia. Anemia panel: Iron 99, TIBC 160, saturation ratio 62, ferritin 1771, folate >100 and B12: 3284. Repeat Haptoglobin was normal but LDH was mildly elevated. Labs also showed pancytopenia with Hg 7.4, WBC 3, PLT 54.Dr. Lindi Adie with hematology saw the patient and suspected thalassemia. MTX and Arava was discontinued at discharge.   He was seen back by Angelena Form PA with structural heart team last month still weak with SOB and was getting transfusions by Hematology. It was felt that his SOB was due to anemia and volume overload.  His diuretic was increased for 3 days.  He was taking better PO after esophageal dilatation.  He was cleared from cardiac standpoint for shoulder surgery but apparently this was not relayed to the orthopedist and now he is back for cardiac clearance.  Also Dr. Burt Knack had cleared him to be off Plavix for his shoulder surgery.   He tells me that he is feeling much better. He  has less SOB and denies any CP. He has not had any PND, orthopnea, LE edema, dizziness or syncope.    The patient does not have symptoms concerning for COVID-19 infection (fever, chills, cough, or new shortness of breath).    Prior CV studies:   The following studies were reviewed today:  none  Past Medical History:  Diagnosis Date   Aortic insufficiency 03/24/2019   AI of bioprosthetic AVR   Chronic diastolic CHF (congestive heart failure) ([place]) 03/24/2019   Colon polyps    s/p diverticular perforation requiring 2-stage repair   Derangement of right shoulder joint    need replacing has no use of   Diverticulosis    DJD (degenerative joint disease), lumbosacral    Dysphagia    eats soft food   Esophageal stricture    GERD (gastroesophageal reflux disease)    Hemolytic anemia ([place])    History of blood transfusion given with 04-15-19 surgery   History of kidney stones    passed stones   Hypertension    Mitral regurgitation    moderate   Occipital neuralgia    Postoperative atrial fibrillation ([place]) 05/10/2015   Prosthetic valve dysfunction    Rheumatoid arthritis (HCC)    s/o long term steroids  shoulders and hands   Rotator cuff arthropathy    right   S/P aortic valve replacement with bioprosthetic valve 01/16/2008   41mm Edwards Magna perimount bovine pericardial tissue valve, model 3000   S/P valve-in-valve TAVR 04/15/2019  26 mm Edwards Sapien 3 Ultra transcatheter heart valve placed via percutaneous right transfemoral approach    SBE (subacute bacterial endocarditis) prophylaxis candidate    for dental procedures   Severe aortic stenosis    S/P prosthetic valve replacement w 25 mm Edwards like science percardial tissue valve,Carissa Musick - 01/2008   Past Surgical History:  Procedure Laterality Date   APPENDECTOMY     BALLOON DILATION N/A 06/12/2019   Procedure: BALLOON DILATION;  Surgeon: Ronnette Juniper, MD;  Location: [facility];  Service:  Gastroenterology;  Laterality: N/A;   BOTOX INJECTION N/A 01/22/2018   Procedure: BOTOX INJECTION;  Surgeon: Ronnette Juniper, MD;  Location: [facility];  Service: Gastroenterology;  Laterality: N/A;   CARDIAC CATHETERIZATION     09   CARDIAC VALVE REPLACEMENT  01/2008   aortic valve replacement   CATARACT EXTRACTION W/ INTRAOCULAR LENS  IMPLANT, BILATERAL     COLON RESECTION     diverticulitis    dental implants     permanent   ESOPHAGEAL MANOMETRY N/A 11/07/2017   Procedure: ESOPHAGEAL MANOMETRY (EM);  Surgeon: Ronnette Juniper, MD;  Location: [facility];  Service: Gastroenterology;  Laterality: N/A;   ESOPHAGOGASTRODUODENOSCOPY (EGD) WITH PROPOFOL N/A 01/22/2018   Procedure: ESOPHAGOGASTRODUODENOSCOPY (EGD) WITH PROPOFOL;  Surgeon: Ronnette Juniper, MD;  [facility]: [facility];  Service: Gastroenterology;  Laterality: N/A;   ESOPHAGOGASTRODUODENOSCOPY (EGD) WITH PROPOFOL N/A 04/30/2019   Procedure: ESOPHAGOGASTRODUODENOSCOPY (EGD) WITH PROPOFOL;  Surgeon: Laurence Spates, MD;  Location: [place];  Service: Endoscopy;  Laterality: N/A;   ESOPHAGOGASTRODUODENOSCOPY (EGD) WITH PROPOFOL N/A 06/12/2019   Procedure: ESOPHAGOGASTRODUODENOSCOPY (EGD) WITH PROPOFOL;  Surgeon: Ronnette Juniper, MD;  Location: [facility];  Service: Gastroenterology;  Laterality: N/A;  with botox injection   EXCISIONAL TOTAL SHOULDER ARTHROPLASTY WITH ANTIBIOTIC SPACER Right 12/31/2018   Procedure: EXCISIONAL TOTAL SHOULDER ARTHROPLASTY WITH ANTIBIOTIC SPACER;  Surgeon: Netta Cedars, MD;  Location: [place];  Service: Orthopedics;  Laterality: Right;   HERNIA REPAIR     INTRAOPERATIVE TRANSTHORACIC ECHOCARDIOGRAM N/A 04/15/2019   Procedure: Intraoperative Transthoracic Echocardiogram;  Surgeon: Sherren Mocha, MD;  Location: [place];  Service: Open Heart Surgery;  Laterality: N/A;   IRRIGATION AND DEBRIDEMENT SHOULDER Right 11/20/2018    IRRIGATION AND DEBRIDEMENT SHOULDER WITH POLY EXCHANGE (Right Shoulder)   IRRIGATION  AND DEBRIDEMENT SHOULDER Right 11/20/2018   Procedure: IRRIGATION AND DEBRIDEMENT SHOULDER WITH POLY EXCHANGE;  Surgeon: Netta Cedars, MD;  Location: [place];  Service: Orthopedics;  Laterality: Right;   LUMBAR LAMINECTOMY     x 2   REVERSE SHOULDER ARTHROPLASTY Right 03/01/2018   Procedure: RIGHT REVERSE SHOULDER ARTHROPLASTY;  Surgeon: Netta Cedars, MD;  Location: [place];  Service: Orthopedics;  Laterality: Right;   RIGHT/LEFT HEART CATH AND CORONARY ANGIOGRAPHY N/A 04/02/2019   Procedure: RIGHT/LEFT HEART CATH AND CORONARY ANGIOGRAPHY;  Surgeon: Leonie Man, MD;  Location: [place] [facility];  Service: Cardiovascular;  Laterality: N/A;   SAVORY DILATION N/A 01/22/2018   Procedure: SAVORY DILATION;  Surgeon: Ronnette Juniper, MD;  Location: [facility];  Service: Gastroenterology;  Laterality: N/A;   SAVORY DILATION N/A 04/30/2019   Procedure: SAVORY DILATION;  Surgeon: Laurence Spates, MD;  Location: [place];  Service: Endoscopy;  Laterality: N/A;  With fluro   SHOULDER HEMI-ARTHROPLASTY Right 06/14/2018   Procedure: RIGHT  REVERSE TOTAL SHOULDER OPEN POLY EXCHANGE;  Surgeon: Netta Cedars, MD;  Location: [place];  Service: Orthopedics;  Laterality: Right;   SUBMUCOSAL INJECTION  06/12/2019   Procedure: SUBMUCOSAL INJECTION;  Surgeon: Ronnette Juniper, MD;  Location:  [facility];  Service: Gastroenterology;;   TEE WITHOUT CARDIOVERSION N/A 01/29/2014   Procedure: TRANSESOPHAGEAL ECHOCARDIOGRAM (TEE);  Surgeon: Sueanne Margarita, MD;  Location: [facility];  Service: Cardiovascular;  Laterality: N/A;   TEE WITHOUT CARDIOVERSION N/A 04/02/2019   Procedure: TRANSESOPHAGEAL ECHOCARDIOGRAM (TEE);  Surgeon: Josue Hector, MD;  Location: [facility];  Service: Cardiovascular;  Laterality: N/A;   TONSILLECTOMY     TRANSCATHETER AORTIC VALVE REPLACEMENT, TRANSFEMORAL  04/15/2019   TRANSCATHETER AORTIC VALVE REPLACEMENT, TRANSFEMORAL N/A 04/15/2019   Procedure: TRANSCATHETER AORTIC VALVE  REPLACEMENT, TRANSFEMORAL with POST BALLOON DILATION;  Surgeon: Sherren Mocha, MD;  Location: [place];  Service: Open Heart Surgery;  Laterality: N/A;     No outpatient medications have been marked as taking for the 06/24/19 encounter (Appointment) with Sueanne Margarita, MD.     Allergies:   Patient has no known allergies.   Social History   Tobacco Use   Smoking status: Former Smoker    Packs/day: 0.50    Years: 10.00    Pack years: 5.00    Types: Cigarettes    Start date: 01/16/1974    Quit date: 09/05/1983    Years since quitting: 35.8   Smokeless tobacco: Former Systems developer    Types: Chew  Substance Use Topics   Alcohol use: Not Currently    Alcohol/week: 0.0 standard drinks   Drug use: No     Family Hx: The patient's family history includes Arthritis in his father; Heart Problems in his sister; Heart disease in his father; Other in his brother, daughter, mother, and sister; Suicidality in his brother.  ROS:   Please see the history of present illness.     All other systems reviewed and are negative.   Labs/Other Tests and Data Reviewed:    Recent Labs: 04/16/2019: Magnesium 1.9 04/23/2019: NT-Pro BNP 1,582 04/28/2019: B Natriuretic Peptide 332.0 05/20/2019: ALT 16 05/31/2019: BUN 14; Creatinine, Ser 0.98; Hemoglobin 9.3; Platelets 115; Potassium 4.0; Sodium 138   Recent Lipid Panel Lab Results  Component Value Date/Time   CHOL  01/14/2008 04:50 AM    174        ATP III CLASSIFICATION:  <200     mg/dL   Desirable  200-239  mg/dL   Borderline High  >=240    mg/dL   High   TRIG 43 01/14/2008 04:50 AM   HDL 65 01/14/2008 04:50 AM   CHOLHDL 2.7 01/14/2008 04:50 AM   LDLCALC (H) 01/14/2008 04:50 AM    100        Total Cholesterol/HDL:CHD Risk Coronary Heart Disease Risk Table                     Men   Women  1/2 Average Risk   3.4   3.3    Wt Readings from Last 3 Encounters:  06/12/19 100 lb (45.4 kg)  05/31/19 100 lb (45.4 kg)  05/20/19 99 lb 1.6 oz (45 kg)       Objective:    Vital Signs:  There were no vitals taken for this visit.    ASSESSMENT & PLAN:    1. Aortic Insufficiency -severe bioprosthetic AI with recent CHF sx -now s/p valve in valve TAVR in 04/2019  2.  Severe AS -s/p remote AVR for severe AS in 2009 with bioprosthetic 75mm Edwards Science pericardial tissue AVR -see above - developed severe bioprosthetic AR and now s/p valve in valve TAVR -doing well, repeat echo 05/2019 showed normal LVF with stable TAVR  with normal function with mean gradient 33mmHg and trivial periprosthetic AI. -continue ASA and Plavix -He is currently on Plavix but has been cleared to hold it by Dr. Burt Knack for his shoulder surgery  3.  Non obstructive ASCAD -nonobstructive by cath 2009 with 20-30% OM2 -nonobstructive by coronary CTA 2015 in all 3 vessels -he has not had any anginal sx but does have chronic SOB felt multifactorial from chronic diastolic CHF and chronic anemia -cardiac cath at time of TAVR showed normal coronary arteries  4.  SOB -this is chronic and suspect multifactorial from anemia, CHF and possible ischemia -normal functioning TAVR last month by echo  5.  LE edema -stable -encouraged him to follow a < 2gm Na diet -continue Lasix 40mg  daily  6.  Chronic diastolic CHF -likely multifactorial due to anemia, dietary indiscretion from Na and anemia -continue diuretics and BB  7.  Preoperative cardiac clearance -needed for shoulder surgery that is urgent -already cleared by Angelena Form, PA with structural heart team -Per Dr. Burt Knack, ok to hold Plavix for procedure as he will be 3 months out from TAVR  COVID-19 Education: The signs and symptoms of COVID-19 were discussed with the patient and how to seek care for testing (follow up with PCP or arrange E-visit).  The importance of social distancing was discussed today.  Patient Risk:   After full review of this patient's clinical status, I feel that they are at least  moderate risk at this time.  Time:   Today, I have spent 20 minutes directly with the patient on telemedicine discussing medical problems including AI s/p TAVR, HTN, SOB, CHF, preop clearance.  We also reviewed the symptoms of COVID 19 and the ways to protect against contracting the virus with telehealth technology.  I spent an additional 5 minutes reviewing patient's chart including cardiac cath, 2D echo.  Medication Adjustments/Labs and Tests Ordered: Current medicines are reviewed at length with the patient today.  Concerns regarding medicines are outlined above.  Tests Ordered: No orders of the defined types were placed in this encounter.  Medication Changes: No orders of the defined types were placed in this encounter.   Disposition:  Follow up in 6 month(s)  Signed, [name], MD  06/23/2019 10:18 PM    [facility]

## 2019-06-23 NOTE — Telephone Encounter (Signed)
I have attempted to contact this patient by phone, to review meds, but no answer. Will try again later

## 2019-06-24 ENCOUNTER — Other Ambulatory Visit: Payer: Self-pay

## 2019-06-24 ENCOUNTER — Telehealth (INDEPENDENT_AMBULATORY_CARE_PROVIDER_SITE_OTHER): Payer: Medicare Other | Admitting: Cardiology

## 2019-06-24 VITALS — BP 117/67 | HR 56 | Ht 62.0 in | Wt 102.0 lb

## 2019-06-24 DIAGNOSIS — I350 Nonrheumatic aortic (valve) stenosis: Secondary | ICD-10-CM | POA: Diagnosis not present

## 2019-06-24 DIAGNOSIS — I10 Essential (primary) hypertension: Secondary | ICD-10-CM

## 2019-06-24 DIAGNOSIS — Z01818 Encounter for other preprocedural examination: Secondary | ICD-10-CM

## 2019-06-24 DIAGNOSIS — I5032 Chronic diastolic (congestive) heart failure: Secondary | ICD-10-CM

## 2019-06-24 DIAGNOSIS — R600 Localized edema: Secondary | ICD-10-CM

## 2019-06-24 DIAGNOSIS — I351 Nonrheumatic aortic (valve) insufficiency: Secondary | ICD-10-CM

## 2019-06-24 DIAGNOSIS — Z952 Presence of prosthetic heart valve: Secondary | ICD-10-CM

## 2019-06-30 NOTE — Progress Notes (Signed)
Patient Care Team: Lavone Orn, MD as PCP - General (Internal Medicine) Sueanne Margarita, MD as PCP - Cardiology (Cardiology) Sueanne Margarita, MD as Consulting Physician (Cardiology)  DIAGNOSIS:    ICD-10-CM   1. Other pancytopenia ([place])  [id number] Erythropoietin    CBC with Differential (Cancer Center Only)    Sample to Blood Bank    Reticulocytes    CHIEF COMPLIANT: Follow-up of anemia, thrombocytopenia  INTERVAL HISTORY: Joshua Hudson is a 84 y.o. with above-mentioned history of anemia and thrombocytopenia. He was seen in the ED on 05/31/19 for hematuria. Labs on 06/05/19 at his rheumatologist showed an m-spike. He presents to the clinic today to review his labs.  REVIEW OF SYSTEMS:   Constitutional: Denies fevers, chills or abnormal weight loss Eyes: Denies blurriness of vision Ears, nose, mouth, throat, and face: Denies mucositis or sore throat Respiratory: Denies cough, dyspnea or wheezes Cardiovascular: Denies palpitation, chest discomfort Gastrointestinal: Denies nausea, heartburn or change in bowel habits Skin: Denies abnormal skin rashes Lymphatics: Denies new lymphadenopathy or easy bruising Neurological: Denies numbness, tingling or new weaknesses Behavioral/Psych: Mood is stable, no new changes  Extremities: No lower extremity edema All other systems were reviewed with the patient and are negative.  I have reviewed the past medical history, past surgical history, social history and family history with the patient and they are unchanged from previous note.  ALLERGIES:  has No Known Allergies.  MEDICATIONS:  Current Outpatient Medications  Medication Sig Dispense Refill  . acetaminophen (TYLENOL) 500 MG tablet Take 1,000 mg by mouth every 6 (six) hours as needed for moderate pain or headache.    . ALPRAZolam (XANAX) 0.25 MG tablet Take 1 tablet (0.25 mg total) by mouth at bedtime as needed for anxiety or sleep. 30 tablet 0  . aspirin EC 81 MG EC tablet Take 1  tablet (81 mg total) by mouth daily. 30 tablet   . cholecalciferol (VITAMIN D3) 25 MCG (1000 UT) tablet Take 1,000 Units by mouth daily.    . clopidogrel (PLAVIX) 75 MG tablet Take 1 tablet (75 mg total) by mouth daily with breakfast. 90 tablet 1  . feeding supplement, ENSURE ENLIVE, (ENSURE ENLIVE) LIQD Take 237 mLs by mouth 3 (three) times daily between meals. [id number] mL 12  . folic acid (FOLVITE) 1 MG tablet Take 1 mg by mouth daily.    . furosemide (LASIX) 40 MG tablet Take 40 mg daily. (Patient taking differently: Take 40 mg by mouth daily. ) 90 tablet 3  . MEGARED OMEGA-3 KRILL OIL PO Take 750 mg by mouth daily.     . Melatonin 3 MG TABS Take 3 mg by mouth at bedtime as needed.     . metoprolol succinate (TOPROL-XL) 25 MG 24 hr tablet Take 1 tablet by mouth once daily (Patient taking differently: Take 25 mg by mouth daily. ) 90 tablet 1  . Multiple Vitamin (MULTIVITAMIN WITH MINERALS) TABS tablet Take 1 tablet by mouth daily.    . pantoprazole (PROTONIX) 40 MG tablet Take 40 mg by mouth daily before breakfast.     . potassium chloride SA (K-DUR) 20 MEQ tablet Take 20 meq daily 90 tablet 3  . predniSONE (DELTASONE) 5 MG tablet Take 5 mg by mouth daily with breakfast.     . THERATEARS 0.25 % SOLN Place 1 drop into both eyes 3 (three) times daily as needed (dry/irritated eyes.).    Marland Kitchen Turmeric 500 MG CAPS Take 500 mg by mouth daily.    Marland Kitchen  vitamin B-12 (CYANOCOBALAMIN) 1000 MCG tablet Take 1,000 mcg by mouth daily.     No current facility-administered medications for this visit.     PHYSICAL EXAMINATION: ECOG PERFORMANCE STATUS: 1 - Symptomatic but completely ambulatory  Vitals:   07/01/19 1036  BP: 121/74  Pulse: 87  Resp: 17  Temp: 98.9 F (37.2 C)  SpO2: 100%   Filed Weights   07/01/19 1036  Weight: 102 lb 9.6 oz (46.5 kg)    GENERAL: alert, no distress and comfortable SKIN: skin color, texture, turgor are normal, no rashes or significant lesions EYES: normal, Conjunctiva are  pink and non-injected, sclera clear OROPHARYNX: no exudate, no erythema and lips, buccal mucosa, and tongue normal  NECK: supple, thyroid normal size, non-tender, without nodularity LYMPH: no palpable lymphadenopathy in the cervical, axillary or inguinal LUNGS: clear to auscultation and percussion with normal breathing effort HEART: regular rate & rhythm and no murmurs and no lower extremity edema ABDOMEN: abdomen soft, non-tender and normal bowel sounds MUSCULOSKELETAL: no cyanosis of digits and no clubbing  NEURO: alert & oriented x 3 with fluent speech, no focal motor/sensory deficits EXTREMITIES: No lower extremity edema  LABORATORY DATA:  I have reviewed the data as listed CMP Latest Ref Rng & Units 05/31/2019 05/20/2019 05/06/2019  Glucose 70 - 99 mg/dL 91 111(H) 110(H)  BUN 8 - 23 mg/dL 14 20 20   Creatinine 0.61 - 1.24 mg/dL 0.98 0.90 0.87  Sodium 135 - 145 mmol/L 138 140 139  Potassium 3.5 - 5.1 mmol/L 4.0 4.5 4.6  Chloride 98 - 111 mmol/L 104 105 107  CO2 22 - 32 mmol/L 25 30 26   Calcium 8.9 - 10.3 mg/dL 9.0 8.7(L) 9.1  Total Protein 6.5 - 8.1 g/dL - 6.0(L) 5.9(L)  Total Bilirubin 0.3 - 1.2 mg/dL - 0.6 0.6  Alkaline Phos 38 - 126 U/L - 82 77  AST 15 - 41 U/L - 33 34  ALT 0 - 44 U/L - 16 25    Lab Results  Component Value Date   WBC 7.0 07/01/2019   HGB 9.1 (L) 07/01/2019   HCT 29.1 (L) 07/01/2019   MCV 73.3 (L) 07/01/2019   PLT 147 (L) 07/01/2019   NEUTROABS 5.5 07/01/2019    ASSESSMENT & PLAN:  Other pancytopenia ([place]) On 05/06/2019: Hemoglobin 7.6: Blood transfusion given Hemoglobin electrophoresis: Beta thalassemia minor.  This is the cause of microcytosis.  It is not iron deficiency related.  Possible differential: Myelodysplastic syndrome versus methotrexate related toxicity. His methotrexate has been on hold.  Because of that his blood counts are improving.  Lab review: 07/01/2019: Hemoglobin 9.1 He does not need blood transfusion at this time.  Thrombocytopenia: Resolved since methotrexate was held.  Previously was down to 70-80 K  Return to clinic in 6 weeks with labs and follow-up.  We will hold space for blood transfusion if necessary in 6 weeks.     Orders Placed This Encounter  Procedures  . Erythropoietin    Standing Status:   Future    Standing Expiration Date:   06/30/2020  . CBC with Differential (Cancer Center Only)    Standing Status:   Future    Standing Expiration Date:   06/30/2020  . Reticulocytes    Standing Status:   Future    Standing Expiration Date:   06/30/2020  . Sample to Blood Bank    Standing Status:   Future    Standing Expiration Date:   06/30/2020   The patient has a good understanding  of the overall plan. he agrees with it. he will call with any problems that may develop before the next visit here.  Nicholas Lose, MD 07/01/2019  Julious Oka Dorshimer am acting as scribe for Dr. Nicholas Lose.  I have reviewed the above documentation for accuracy and completeness, and I agree with the above.

## 2019-07-01 ENCOUNTER — Inpatient Hospital Stay: Payer: Medicare Other | Admitting: Hematology and Oncology

## 2019-07-01 ENCOUNTER — Other Ambulatory Visit: Payer: Self-pay

## 2019-07-01 ENCOUNTER — Inpatient Hospital Stay: Payer: Medicare Other

## 2019-07-01 ENCOUNTER — Inpatient Hospital Stay: Payer: Medicare Other | Attending: Hematology and Oncology

## 2019-07-01 DIAGNOSIS — D696 Thrombocytopenia, unspecified: Secondary | ICD-10-CM

## 2019-07-01 DIAGNOSIS — D61818 Other pancytopenia: Secondary | ICD-10-CM

## 2019-07-01 DIAGNOSIS — D563 Thalassemia minor: Secondary | ICD-10-CM | POA: Insufficient documentation

## 2019-07-01 LAB — SAMPLE TO BLOOD BANK

## 2019-07-01 LAB — CBC WITH DIFFERENTIAL (CANCER CENTER ONLY)
Abs Immature Granulocytes: 0.03 K/uL (ref 0.00–0.07)
Basophils Absolute: 0 K/uL (ref 0.0–0.1)
Basophils Relative: 1 %
Eosinophils Absolute: 0.1 K/uL (ref 0.0–0.5)
Eosinophils Relative: 1 %
HCT: 29.1 % — ABNORMAL LOW (ref 39.0–52.0)
Hemoglobin: 9.1 g/dL — ABNORMAL LOW (ref 13.0–17.0)
Immature Granulocytes: 0 %
Lymphocytes Relative: 14 %
Lymphs Abs: 1 K/uL (ref 0.7–4.0)
MCH: 22.9 pg — ABNORMAL LOW (ref 26.0–34.0)
MCHC: 31.3 g/dL (ref 30.0–36.0)
MCV: 73.3 fL — ABNORMAL LOW (ref 80.0–100.0)
Monocytes Absolute: 0.4 K/uL (ref 0.1–1.0)
Monocytes Relative: 5 %
Neutro Abs: 5.5 K/uL (ref 1.7–7.7)
Neutrophils Relative %: 79 %
Platelet Count: 147 K/uL — ABNORMAL LOW (ref 150–400)
RBC: 3.97 MIL/uL — ABNORMAL LOW (ref 4.22–5.81)
RDW: 18.9 % — ABNORMAL HIGH (ref 11.5–15.5)
WBC Count: 7 K/uL (ref 4.0–10.5)
nRBC: 0.4 % — ABNORMAL HIGH (ref 0.0–0.2)

## 2019-07-01 LAB — RETICULOCYTES
Immature Retic Fract: 24.2 % — ABNORMAL HIGH (ref 2.3–15.9)
RBC.: 3.93 MIL/uL — ABNORMAL LOW (ref 4.22–5.81)
Retic Count, Absolute: 48.7 K/uL (ref 19.0–186.0)
Retic Ct Pct: 1.2 % (ref 0.4–3.1)

## 2019-07-01 NOTE — Assessment & Plan Note (Signed)
On 05/06/2019: Hemoglobin 7.6: Blood transfusion given Hemoglobin electrophoresis: Beta thalassemia minor.  This is the cause of microcytosis.  It is not iron deficiency related.  Possible differential: Myelodysplastic syndrome versus methotrexate related toxicity. His methotrexate has been on hold.  Because of that his blood counts are improving.  Lab review: 07/01/2019:  Thrombocytopenia: Resolved since methotrexate was held.  Previously was down to 70-80 K  Return to clinic in 3 months with labs and follow-up

## 2019-07-02 ENCOUNTER — Telehealth: Payer: Self-pay | Admitting: Hematology and Oncology

## 2019-07-02 NOTE — Telephone Encounter (Signed)
I talk with patient regarding schedule  

## 2019-07-03 NOTE — H&P (Signed)
Patient's anticipated LOS is less than 2 midnights, meeting these requirements: - Younger than 24 - Lives within 1 hour of care - Has a competent adult at home to recover with post-op recover - NO history of  - Chronic pain requiring opiods  - Diabetes  - Coronary Artery Disease  - Heart failure  - Heart attack  - Stroke  - DVT/VTE  - Cardiac arrhythmia  - Respiratory Failure/COPD  - Renal failure  - Anemia  - Advanced Liver disease       Joshua Hudson is an 84 y.o. male.    Chief Complaint: right shoulder pain  HPI: Pt is a 84 y.o. male complaining of right shoulder pain for multiple years. Pain had continually increased since the beginning. X-rays in the clinic show previous antibiotic spacer placed after failed arthroplasty. Pt has tried various conservative treatments which have failed to alleviate their symptoms, including injections and therapy. Various options are discussed with the patient. Risks, benefits and expectations were discussed with the patient. Patient understand the risks, benefits and expectations and wishes to proceed with surgery.   PCP:  Lavone Orn, MD  D/C Plans: Home  PMH: Past Medical History:  Diagnosis Date  . Aortic insufficiency 03/24/2019   AI of bioprosthetic AVR  . Chronic diastolic CHF (congestive heart failure) ([place]) 03/24/2019  . Colon polyps    s/p diverticular perforation requiring 2-stage repair  . Derangement of right shoulder joint    need replacing has no use of  . Diverticulosis   . DJD (degenerative joint disease), lumbosacral   . Dysphagia    eats soft food  . Esophageal stricture   . GERD (gastroesophageal reflux disease)   . Hemolytic anemia ([place])   . History of blood transfusion given with 04-15-19 surgery  . History of kidney stones    passed stones  . Hypertension   . Mitral regurgitation    moderate  . Occipital neuralgia   . Postoperative atrial fibrillation ([place]) 05/10/2015  . Prosthetic valve  dysfunction   . Rheumatoid arthritis ([place])    s/o long term steroids  shoulders and hands  . Rotator cuff arthropathy    right  . S/P aortic valve replacement with bioprosthetic valve 01/16/2008   36mm Edwards Magna perimount bovine pericardial tissue valve, model 3000  . S/P valve-in-valve TAVR 04/15/2019   26 mm Edwards Sapien 3 Ultra transcatheter heart valve placed via percutaneous right transfemoral approach   . SBE (subacute bacterial endocarditis) prophylaxis candidate    for dental procedures  . Severe aortic stenosis    S/P prosthetic valve replacement w 25 mm Edwards like science percardial tissue valve,[place] - 01/2008    PSH: Past Surgical History:  Procedure Laterality Date  . APPENDECTOMY    . BALLOON DILATION N/A 06/12/2019   Procedure: BALLOON DILATION;  Surgeon: Ronnette Juniper, MD;  Location: [facility];  Service: Gastroenterology;  Laterality: N/A;  . BOTOX INJECTION N/A 01/22/2018   Procedure: BOTOX INJECTION;  Surgeon: Ronnette Juniper, MD;  Location: [facility];  Service: Gastroenterology;  Laterality: N/A;  . CARDIAC CATHETERIZATION     09  . CARDIAC VALVE REPLACEMENT  01/2008   aortic valve replacement  . CATARACT EXTRACTION W/ INTRAOCULAR LENS  IMPLANT, BILATERAL    . COLON RESECTION     diverticulitis   . dental implants     permanent  . ESOPHAGEAL MANOMETRY N/A 11/07/2017   Procedure: ESOPHAGEAL MANOMETRY (EM);  Surgeon: Ronnette Juniper, MD;  Location: [facility];  Service:  Gastroenterology;  Laterality: N/A;  . ESOPHAGOGASTRODUODENOSCOPY (EGD) WITH PROPOFOL N/A 01/22/2018   Procedure: ESOPHAGOGASTRODUODENOSCOPY (EGD) WITH PROPOFOL;  Surgeon: Ronnette Juniper, MD;  [facility]: [facility];  Service: Gastroenterology;  Laterality: N/A;  . ESOPHAGOGASTRODUODENOSCOPY (EGD) WITH PROPOFOL N/A 04/30/2019   Procedure: ESOPHAGOGASTRODUODENOSCOPY (EGD) WITH PROPOFOL;  Surgeon: Laurence Spates, MD;  Location: [place] [facility];  Service: Endoscopy;  Laterality: N/A;  .  ESOPHAGOGASTRODUODENOSCOPY (EGD) WITH PROPOFOL N/A 06/12/2019   Procedure: ESOPHAGOGASTRODUODENOSCOPY (EGD) WITH PROPOFOL;  Surgeon: Ronnette Juniper, MD;  [facility];  Service: Gastroenterology;  Laterality: N/A;  with botox injection  . EXCISIONAL TOTAL SHOULDER ARTHROPLASTY WITH ANTIBIOTIC SPACER Right 12/31/2018   Procedure: EXCISIONAL TOTAL SHOULDER ARTHROPLASTY WITH ANTIBIOTIC SPACER;  Surgeon: Netta Cedars, MD;  Location: [facility];  Service: Orthopedics;  Laterality: Right;  . HERNIA REPAIR    . INTRAOPERATIVE TRANSTHORACIC ECHOCARDIOGRAM N/A 04/15/2019   Procedure: Intraoperative Transthoracic Echocardiogram;  Surgeon: Sherren Mocha, MD;  Location: [facility];  Service: Open Heart Surgery;  Laterality: N/A;  . IRRIGATION AND DEBRIDEMENT SHOULDER Right 11/20/2018    IRRIGATION AND DEBRIDEMENT SHOULDER WITH POLY EXCHANGE (Right Shoulder)  . IRRIGATION AND DEBRIDEMENT SHOULDER Right 11/20/2018   Procedure: IRRIGATION AND DEBRIDEMENT SHOULDER WITH POLY EXCHANGE;  Surgeon: Netta Cedars, MD;  Location: [facility];  Service: Orthopedics;  Laterality: Right;  . LUMBAR LAMINECTOMY     x 2  . REVERSE SHOULDER ARTHROPLASTY Right 03/01/2018   Procedure: RIGHT REVERSE SHOULDER ARTHROPLASTY;  Surgeon: Netta Cedars, MD;  Location: [facility];  Service: Orthopedics;  Laterality: Right;  . RIGHT/LEFT HEART CATH AND CORONARY ANGIOGRAPHY N/A 04/02/2019   Procedure: RIGHT/LEFT HEART CATH AND CORONARY ANGIOGRAPHY;  Surgeon: Leonie Man, MD;  Location: [facility];  Service: Cardiovascular;  Laterality: N/A;  . SAVORY DILATION N/A 01/22/2018   Procedure: SAVORY DILATION;  Surgeon: Ronnette Juniper, MD;  [facility];  Service: Gastroenterology;  Laterality: N/A;  . SAVORY DILATION N/A 04/30/2019   Procedure: SAVORY DILATION;  Surgeon: Laurence Spates, MD;  Location: [facility];  Service: Endoscopy;  Laterality: N/A;  With fluro  . SHOULDER HEMI-ARTHROPLASTY Right 06/14/2018   Procedure: RIGHT  REVERSE  TOTAL SHOULDER OPEN POLY EXCHANGE;  Surgeon: Netta Cedars, MD;  Location: [facility];  Service: Orthopedics;  Laterality: Right;  . SUBMUCOSAL INJECTION  06/12/2019   Procedure: SUBMUCOSAL INJECTION;  Surgeon: Ronnette Juniper, MD;  Location: [facility];  Service: Gastroenterology;;  . TEE WITHOUT CARDIOVERSION N/A 01/29/2014   Procedure: TRANSESOPHAGEAL ECHOCARDIOGRAM (TEE);  Surgeon: Sueanne Margarita, MD;  [facility];  Service: Cardiovascular;  Laterality: N/A;  . TEE WITHOUT CARDIOVERSION N/A 04/02/2019   Procedure: TRANSESOPHAGEAL ECHOCARDIOGRAM (TEE);  Surgeon: Josue Hector, MD;  Location: [facility];  Service: Cardiovascular;  Laterality: N/A;  . TONSILLECTOMY    . TRANSCATHETER AORTIC VALVE REPLACEMENT, TRANSFEMORAL  04/15/2019  . TRANSCATHETER AORTIC VALVE REPLACEMENT, TRANSFEMORAL N/A 04/15/2019   Procedure: TRANSCATHETER AORTIC VALVE REPLACEMENT, TRANSFEMORAL with POST BALLOON DILATION;  Surgeon: Sherren Mocha, MD;  Location: [facility];  Service: Open Heart Surgery;  Laterality: N/A;    Social History:  reports that he quit smoking about 35 years ago. His smoking use included cigarettes. He started smoking about 45 years ago. He has a 5.00 pack-year smoking history. He has quit using smokeless tobacco.  His smokeless tobacco use included chew. He reports previous alcohol use. He reports that he does not use drugs.  Allergies:  No Known Allergies  Medications: No current facility-administered medications for this encounter.    Current  Outpatient Medications  Medication Sig Dispense Refill  . acetaminophen (TYLENOL) 500 MG tablet Take 1,000 mg by mouth every 6 (six) hours as needed for moderate pain or headache.    . ALPRAZolam (XANAX) 0.25 MG tablet Take 1 tablet (0.25 mg total) by mouth at bedtime as needed for anxiety or sleep. 30 tablet 0  . aspirin EC 81 MG EC tablet Take 1 tablet (81 mg total) by mouth daily. 30 tablet   . cholecalciferol (VITAMIN D3) 25 MCG (1000 UT) tablet  Take 1,000 Units by mouth daily.    . clopidogrel (PLAVIX) 75 MG tablet Take 1 tablet (75 mg total) by mouth daily with breakfast. 90 tablet 1  . feeding supplement, ENSURE ENLIVE, (ENSURE ENLIVE) LIQD Take 237 mLs by mouth 3 (three) times daily between meals. [id number] mL 12  . folic acid (FOLVITE) 1 MG tablet Take 1 mg by mouth daily.    . furosemide (LASIX) 40 MG tablet Take 40 mg daily. (Patient taking differently: Take 40 mg by mouth daily. ) 90 tablet 3  . MEGARED OMEGA-3 KRILL OIL PO Take 750 mg by mouth daily.     . Melatonin 3 MG TABS Take 3 mg by mouth at bedtime as needed.     . metoprolol succinate (TOPROL-XL) 25 MG 24 hr tablet Take 1 tablet by mouth once daily (Patient taking differently: Take 25 mg by mouth daily. ) 90 tablet 1  . Multiple Vitamin (MULTIVITAMIN WITH MINERALS) TABS tablet Take 1 tablet by mouth daily.    . pantoprazole (PROTONIX) 40 MG tablet Take 40 mg by mouth daily before breakfast.     . potassium chloride SA (K-DUR) 20 MEQ tablet Take 20 meq daily 90 tablet 3  . predniSONE (DELTASONE) 5 MG tablet Take 5 mg by mouth daily with breakfast.     . THERATEARS 0.25 % SOLN Place 1 drop into both eyes 3 (three) times daily as needed (dry/irritated eyes.).    Marland Kitchen Turmeric 500 MG CAPS Take 500 mg by mouth daily.    . vitamin B-12 (CYANOCOBALAMIN) 1000 MCG tablet Take 1,000 mcg by mouth daily.      Results for orders placed or performed in visit on 07/01/19 (from the past 48 hour(s))  Sample to Blood Bank     Status: None   Collection Time: 07/01/19 10:13 AM  Result Value Ref Range   Blood Bank Specimen SAMPLE AVAILABLE FOR TESTING    Sample Expiration      07/04/2019,2359 Performed at [facility], [place] [address] [place], [place] [address]   Reticulocytes     Status: Abnormal   Collection Time: 07/01/19 10:13 AM  Result Value Ref Range   Retic Ct Pct 1.2 0.4 - 3.1 %   RBC. 3.93 (L) 4.22 - 5.81 MIL/uL   Retic Count, Absolute 48.7 19.0 - 186.0 K/uL    Immature Retic Fract 24.2 (H) 2.3 - 15.9 %    Comment: Performed at [facility], [place] [address] [place], [place] [address]  CBC with Differential ([place] Only)     Status: Abnormal   Collection Time: 07/01/19 10:13 AM  Result Value Ref Range   WBC Count 7.0 4.0 - 10.5 K/uL   RBC 3.97 (L) 4.22 - 5.81 MIL/uL   Hemoglobin 9.1 (L) 13.0 - 17.0 g/dL    Comment: Reticulocyte Hemoglobin testing may be clinically indicated, consider ordering this additional test [id number]    HCT 29.1 (L) 39.0 - 52.0 %  MCV 73.3 (L) 80.0 - 100.0 fL   MCH 22.9 (L) 26.0 - 34.0 pg   MCHC 31.3 30.0 - 36.0 g/dL   RDW 18.9 (H) 11.5 - 15.5 %   Platelet Count 147 (L) 150 - 400 K/uL   nRBC 0.4 (H) 0.0 - 0.2 %   Neutrophils Relative % 79 %   Neutro Abs 5.5 1.7 - 7.7 K/uL   Lymphocytes Relative 14 %   Lymphs Abs 1.0 0.7 - 4.0 K/uL   Monocytes Relative 5 %   Monocytes Absolute 0.4 0.1 - 1.0 K/uL   Eosinophils Relative 1 %   Eosinophils Absolute 0.1 0.0 - 0.5 K/uL   Basophils Relative 1 %   Basophils Absolute 0.0 0.0 - 0.1 K/uL   Immature Granulocytes 0 %   Abs Immature Granulocytes 0.03 0.00 - 0.07 K/uL    Comment: Performed at [facility], [place] [address], [place], [place] [address]   No results found.  ROS: Pain with rom of the right upper extremity  Physical Exam: Alert and oriented 84 y.o. male in no acute distress Cranial nerves 2-12 intact Cervical spine: full rom with no tenderness, nv intact distally Chest: active breath sounds bilaterally, no wheeze rhonchi or rales Heart: regular rate and rhythm, no murmur Abd: non tender non distended with active bowel sounds Hip is stable with rom  Right shoulder with limited rom nv intact distally No signs of rashes or infections   Assessment/Plan Assessment: right shoulder pain with previous infection, spacer currently in place  Plan:  Patient will undergo a revision right shoulder by Dr.  Veverly Fells at [facility]. Risks benefits and expectations were discussed with the patient. Patient understand risks, benefits and expectations and wishes to proceed. Preoperative templating of the joint replacement has been completed, documented, and submitted to the Operating Room personnel in order to optimize intra-operative equipment management.   Merla Riches PA-C, MPAS [facility] is now [employer] [address] [place], [place], [place] [address] Phone: [phone] [email or web address] Facebook  [employer]

## 2019-07-14 NOTE — Patient Instructions (Addendum)
DUE TO COVID-19 ONLY ONE VISITOR IS ALLOWED TO COME WITH YOU AND STAY IN THE WAITING ROOM ONLY DURING PRE OP AND PROCEDURE DAY OF SURGERY. THE 1 VISITOR MAY VISIT WITH YOU AFTER SURGERY IN YOUR PRIVATE ROOM DURING VISITING HOURS ONLY!  YOU NEED TO HAVE A COVID 19 TEST ON_11/10______ @__9 :20_____, THIS TEST MUST BE DONE BEFORE SURGERY, COME  [facility] , [id number].  ([place])  ONCE YOUR COVID TEST IS COMPLETED, PLEASE BEGIN THE QUARANTINE INSTRUCTIONS AS OUTLINED IN YOUR HANDOUT.                Joshua Hudson    Your procedure is scheduled on: 07/18/19   Report to [facility] Main  Entrance   Report to Short Stay at 5:30 AM     Call this number if you have problems the morning of surgery [phone]    . BRUSH YOUR TEETH MORNING OF SURGERY AND RINSE YOUR MOUTH OUT, NO CHEWING GUM CANDY OR MINTS.    Do not eat food After Midnight.   YOU MAY HAVE CLEAR LIQUIDS FROM MIDNIGHT UNTIL 4:30AM.   At 4:30AM Please finish the prescribed Pre-Surgery  drink  . Nothing by mouth after you finish the  drink !    Take these medicines the morning of surgery with A SIP OF WATER:  Metoprolol, Prednisone, Finesteride, Protonix use your eye drops                                 You may not have any metal on your body including piercings             Do not wear jewelry, lotions, powders or deodorant                       Men may shave face and neck.   Do not bring valuables to the hospital. [place].  Contacts, dentures or bridgework may not be worn into surgery.       Name and phone number of your driver:  Special Instructions: N/A              Please read over the following fact sheets you were given: _____________________________________________________________________             [facility] - Preparing for Surgery  Before surgery, you can play an important role.    Because skin is  not sterile, your skin needs to be as free of germs as possible.    You can reduce the number of germs on your skin by washing with CHG (chlorahexidine gluconate) soap before surgery.   CHG is an antiseptic cleaner which kills germs and bonds with the skin to continue killing germs even after washing. Please DO NOT use if you have an allergy to CHG or antibacterial soaps.   If your skin becomes reddened/irritated stop using the CHG and inform your nurse when you arrive at Short Stay.   You may shave your face/neck.  Please follow these instructions carefully:  1.  Shower with CHG Soap the night before surgery and the  morning of Surgery.  2.  If you choose to wash your hair, wash your hair first as usual with your  normal  shampoo.  3.  After you shampoo,  rinse your hair and body thoroughly to remove the  shampoo.                                        4.  Use CHG as you would any other liquid soap.  You can apply chg directly  to the skin and wash                       Gently with a scrungie or clean washcloth.  5.  Apply the CHG Soap to your body ONLY FROM THE NECK DOWN.   Do not use on face/ open                           Wound or open sores. Avoid contact with eyes, ears mouth and genitals (private parts).                       Wash face,  Genitals (private parts) with your normal soap.             6.  Wash thoroughly, paying special attention to the area where your surgery  will be performed.  7.  Thoroughly rinse your body with warm water from the neck down.  8.  DO NOT shower/wash with your normal soap after using and rinsing off  the CHG Soap.             9.  Pat yourself dry with a clean towel.            10.  Wear clean pajamas.            11.  Place clean sheets on your bed the night of your first shower and do not  sleep with pets. Day of Surgery : Do not apply any lotions/deodorants the morning of surgery.  Please wear clean clothes to the hospital/surgery center.  FAILURE TO  FOLLOW THESE INSTRUCTIONS MAY RESULT IN THE CANCELLATION OF YOUR SURGERY PATIENT SIGNATURE_________________________________  NURSE SIGNATURE__________________________________  ________________________________________________________________________   Joshua Hudson  An incentive spirometer is a tool that can help keep your lungs clear and active. This tool measures how well you are filling your lungs with each breath. Taking long deep breaths may help reverse or decrease the chance of developing breathing (pulmonary) problems (especially infection) following:  A long period of time when you are unable to move or be active. BEFORE THE PROCEDURE   If the spirometer includes an indicator to show your best effort, your nurse or respiratory therapist will set it to a desired goal.  If possible, sit up straight or lean slightly forward. Try not to slouch.  Hold the incentive spirometer in an upright position. INSTRUCTIONS FOR USE  1. Sit on the edge of your bed if possible, or sit up as far as you can in bed or on a chair. 2. Hold the incentive spirometer in an upright position. 3. Breathe out normally. 4. Place the mouthpiece in your mouth and seal your lips tightly around it. 5. Breathe in slowly and as deeply as possible, raising the piston or the ball toward the top of the column. 6. Hold your breath for 3-5 seconds or for as long as possible. Allow the piston or ball to fall to the bottom of the column. 7. Remove the mouthpiece from your mouth and  breathe out normally. 8. Rest for a few seconds and repeat Steps 1 through 7 at least 10 times every 1-2 hours when you are awake. Take your time and take a few normal breaths between deep breaths. 9. The spirometer may include an indicator to show your best effort. Use the indicator as a goal to work toward during each repetition. 10. After each set of 10 deep breaths, practice coughing to be sure your lungs are clear. If you have an  incision (the cut made at the time of surgery), support your incision when coughing by placing a pillow or rolled up towels firmly against it. Once you are able to get out of bed, walk around indoors and cough well. You may stop using the incentive spirometer when instructed by your caregiver.  RISKS AND COMPLICATIONS  Take your time so you do not get dizzy or light-headed.  If you are in pain, you may need to take or ask for pain medication before doing incentive spirometry. It is harder to take a deep breath if you are having pain. AFTER USE  Rest and breathe slowly and easily.  It can be helpful to keep track of a log of your progress. Your caregiver can provide you with a simple table to help with this. If you are using the spirometer at home, follow these instructions: [place] IF:   You are having difficultly using the spirometer.  You have trouble using the spirometer as often as instructed.  Your pain medication is not giving enough relief while using the spirometer.  You develop fever of 100.5 F (38.1 C) or higher. SEEK IMMEDIATE MEDICAL CARE IF:   You cough up bloody sputum that had not been present before.  You develop fever of 102 F (38.9 C) or greater.  You develop worsening pain at or near the incision site. MAKE SURE YOU:   Understand these instructions.  Will watch your condition.  Will get help right away if you are not doing well or get worse. Document Released: 01/01/2007 Document Revised: 11/13/2011 Document Reviewed: 03/04/2007 [facility] Patient Information 2014 [place], [place].   ________________________________________________________________________

## 2019-07-15 ENCOUNTER — Other Ambulatory Visit (HOSPITAL_COMMUNITY)
Admission: RE | Admit: 2019-07-15 | Discharge: 2019-07-15 | Disposition: A | Discharge status: Home / Self Care | Payer: Medicare Other | Source: Ambulatory Visit | Attending: Orthopedic Surgery | Admitting: Orthopedic Surgery

## 2019-07-15 ENCOUNTER — Encounter (HOSPITAL_COMMUNITY)
Admission: RE | Admit: 2019-07-15 | Discharge: 2019-07-15 | Disposition: A | Discharge status: Home / Self Care | Payer: Medicare Other | Source: Ambulatory Visit | Attending: Orthopedic Surgery | Admitting: Orthopedic Surgery

## 2019-07-15 ENCOUNTER — Other Ambulatory Visit: Payer: Self-pay

## 2019-07-15 ENCOUNTER — Encounter (HOSPITAL_COMMUNITY): Payer: Self-pay

## 2019-07-15 DIAGNOSIS — I9789 Other postprocedural complications and disorders of the circulatory system, not elsewhere classified: Secondary | ICD-10-CM | POA: Insufficient documentation

## 2019-07-15 DIAGNOSIS — Z7902 Long term (current) use of antithrombotics/antiplatelets: Secondary | ICD-10-CM | POA: Insufficient documentation

## 2019-07-15 DIAGNOSIS — M47817 Spondylosis without myelopathy or radiculopathy, lumbosacral region: Secondary | ICD-10-CM | POA: Insufficient documentation

## 2019-07-15 DIAGNOSIS — M25511 Pain in right shoulder: Secondary | ICD-10-CM | POA: Insufficient documentation

## 2019-07-15 DIAGNOSIS — Z7982 Long term (current) use of aspirin: Secondary | ICD-10-CM | POA: Insufficient documentation

## 2019-07-15 DIAGNOSIS — M069 Rheumatoid arthritis, unspecified: Secondary | ICD-10-CM | POA: Insufficient documentation

## 2019-07-15 DIAGNOSIS — Z01812 Encounter for preprocedural laboratory examination: Secondary | ICD-10-CM | POA: Insufficient documentation

## 2019-07-15 DIAGNOSIS — I110 Hypertensive heart disease with heart failure: Secondary | ICD-10-CM | POA: Insufficient documentation

## 2019-07-15 DIAGNOSIS — I5022 Chronic systolic (congestive) heart failure: Secondary | ICD-10-CM | POA: Insufficient documentation

## 2019-07-15 DIAGNOSIS — Z79899 Other long term (current) drug therapy: Secondary | ICD-10-CM | POA: Insufficient documentation

## 2019-07-15 DIAGNOSIS — K219 Gastro-esophageal reflux disease without esophagitis: Secondary | ICD-10-CM | POA: Insufficient documentation

## 2019-07-15 DIAGNOSIS — Z952 Presence of prosthetic heart valve: Secondary | ICD-10-CM | POA: Insufficient documentation

## 2019-07-15 DIAGNOSIS — Z96611 Presence of right artificial shoulder joint: Secondary | ICD-10-CM | POA: Insufficient documentation

## 2019-07-15 DIAGNOSIS — I080 Rheumatic disorders of both mitral and aortic valves: Secondary | ICD-10-CM | POA: Insufficient documentation

## 2019-07-15 DIAGNOSIS — Z87891 Personal history of nicotine dependence: Secondary | ICD-10-CM | POA: Insufficient documentation

## 2019-07-15 DIAGNOSIS — Z7952 Long term (current) use of systemic steroids: Secondary | ICD-10-CM | POA: Insufficient documentation

## 2019-07-15 DIAGNOSIS — D599 Acquired hemolytic anemia, unspecified: Secondary | ICD-10-CM | POA: Insufficient documentation

## 2019-07-15 DIAGNOSIS — Z20828 Contact with and (suspected) exposure to other viral communicable diseases: Secondary | ICD-10-CM | POA: Insufficient documentation

## 2019-07-15 LAB — CBC
HCT: 28.9 % — ABNORMAL LOW (ref 39.0–52.0)
Hemoglobin: 8.8 g/dL — ABNORMAL LOW (ref 13.0–17.0)
MCH: 22.5 pg — ABNORMAL LOW (ref 26.0–34.0)
MCHC: 30.4 g/dL (ref 30.0–36.0)
MCV: 73.9 fL — ABNORMAL LOW (ref 80.0–100.0)
Platelets: 120 10*3/uL — ABNORMAL LOW (ref 150–400)
RBC: 3.91 MIL/uL — ABNORMAL LOW (ref 4.22–5.81)
RDW: 18 % — ABNORMAL HIGH (ref 11.5–15.5)
WBC: 6.6 10*3/uL (ref 4.0–10.5)
nRBC: 0 % (ref 0.0–0.2)

## 2019-07-15 LAB — SURGICAL PCR SCREEN
MRSA, PCR: NEGATIVE
Staphylococcus aureus: NEGATIVE

## 2019-07-15 LAB — BASIC METABOLIC PANEL
Anion gap: 9 (ref 5–15)
BUN: 18 mg/dL (ref 8–23)
CO2: 27 mmol/L (ref 22–32)
Calcium: 9.1 mg/dL (ref 8.9–10.3)
Chloride: 104 mmol/L (ref 98–111)
Creatinine, Ser: 1.07 mg/dL (ref 0.61–1.24)
GFR calc Af Amer: >60 mL/min (ref >60)
GFR calc non Af Amer: >60 mL/min (ref >60)
Glucose, Bld: 79 mg/dL (ref 70–99)
Potassium: 4.3 mmol/L (ref 3.5–5.1)
Sodium: 140 mmol/L (ref 135–145)

## 2019-07-15 NOTE — Progress Notes (Addendum)
PCP - Dr. Electa Sniff Cardiologist - Dr. Ashok Norris  Chest x-ray - 04/11/19 EKG - 04/29/19 Stress Test -  ECHO - 05/15/19 Cardiac Cath - 04/02/19  Sleep Study - no CPAP -   Fasting Blood Sugar - NA Checks Blood Sugar _____ times a day  Blood Thinner Instructions:Plavix and ASA Aspirin Instructions:Dr. Norris's office told the Pt that the nurse at the PAT visit would tell him when to stop ASA and plavix. Pt was told to hold ASA, Plavix and supplements from 11/10- DOS on 11/13 Last Dose:07/14/19  Anesthesia review:   Patient denies shortness of breath, fever, cough and chest pain at PAT appointment yes  Patient verbalized understanding of instructions that were given to them at the PAT appointment. Patient was also instructed that they will need to review over the PAT instructions again at home before surgery. Yes  07/15/19. 15:06.  I called the Pt. To explain that the Dr.'s office should instruct the pt when to stop Plavix before a surgical procedure. He should call the prescriber and let them know that he will only be off the Plavix for 4 days. Anesthesia said that he can still have a block on DOS but that the Pt should call his MD.

## 2019-07-16 NOTE — Anesthesia Preprocedure Evaluation (Addendum)
Anesthesia Evaluation  Patient identified by MRN, date of birth, ID band Patient awake    Reviewed: Allergy & Precautions, H&P , NPO status , Patient's Chart, lab work & pertinent test results, reviewed documented beta blocker date and time   Airway Mallampati: II   Neck ROM: Limited  Mouth opening: Limited Mouth Opening  Dental no notable dental hx. (+) Dental Advisory Given   Pulmonary neg pulmonary ROS, former smoker,    Pulmonary exam normal breath sounds clear to auscultation       Cardiovascular Exercise Tolerance: Good hypertension, Pt. on medications and Pt. on home beta blockers  Rhythm:Regular Rate:Normal     Neuro/Psych negative neurological ROS  negative psych ROS   GI/Hepatic Neg liver ROS, GERD  Medicated and Controlled,  Endo/Other  negative endocrine ROS  Renal/GU negative Renal ROS  negative genitourinary   Musculoskeletal  (+) Arthritis , Osteoarthritis,    Abdominal   Peds  Hematology  (+) Blood dyscrasia, anemia ,   Anesthesia Other Findings   Reproductive/Obstetrics negative OB ROS                          Anesthesia Physical Anesthesia Plan  ASA: III  Anesthesia Plan: General   Post-op Pain Management:  Regional for Post-op pain   Induction: Intravenous  PONV Risk Score and Plan: 2 and Ondansetron, Dexamethasone and Treatment may vary due to age or medical condition  Airway Management Planned: Oral ETT  Additional Equipment:   Intra-op Plan:   Post-operative Plan: Extubation in OR  Informed Consent: I have reviewed the patients History and Physical, chart, labs and discussed the procedure including the risks, benefits and alternatives for the proposed anesthesia with the patient or authorized representative who has indicated his/her understanding and acceptance.     Dental advisory given  Plan Discussed with: CRNA  Anesthesia Plan Comments: (See PAT  note 07/15/2019, Konrad Felix, PA-C)       Anesthesia Quick Evaluation

## 2019-07-16 NOTE — Progress Notes (Signed)
Anesthesia Chart Review   Case: [id number] Date/Time: 07/18/19 0715   Procedure: REVERSE SHOULDER ARTHROPLASTY and removal of antiobotic spacer (Right Shoulder) - interscalene block   Anesthesia type: Choice   Pre-op diagnosis:      Right shoulder antiobotic spacer     shoulder pain   Location: [facility] ROOM 06 / [facility]   Surgeon: Netta Cedars, MD      DISCUSSION:84 y.o. former smoker (5 pack years, quit 09/05/83) with h/o RA, HTN, GERD, chronic diastolic CHF, anemia, s/p TAVR 04/15/2019 by Dr. Sherren Mocha, s/p total shoulder arthroplasty excision with antibiotic spacer placed scheduled for above procedure 07/18/2019 with Dr. Netta Cedars.   Pt following with hematologist regarding pancytopenia.  Last seen by hematologist, Dr. Nicholas Lose, 07/01/2019.  Per OV note, "On 05/06/2019: Hemoglobin 7.6: Blood transfusion given. Hemoglobin electrophoresis: Beta thalassemia minor.This is the cause of microcytosis. It is not iron deficiency related.  Possible differential: Myelodysplastic syndrome versus methotrexate related toxicity.  His methotrexate has been on hold.Because of that his blood counts are improving10/27/2020: Hemoglobin 9.1. He does not need blood transfusion at this time. Thrombocytopenia: Resolved since methotrexate was held.  Previously was down to 70-80 K"  Hemoglobin 8.8 at PAT visit.    Seen by structural heart team 05/15/2019.  Per Angelena Form, PA-C, "the patient will require a total shoulder replacement at some point. He is cleared from a cardiac standpoint to proceed with the surgery. If surgery must be done before 6 months of DAPT, this can be considered after 6 weeks out from TAVR and aspirin and Plavix can be temporarily held."  Lase seen by cardiologist, Dr. [name], 06/24/2019 via telemedicine.  Stable at this visit.  Per OV note, "Preoperative cardiac clearance -needed for shoulder surgery that is urgent -already cleared by Angelena Form, PA with structural heart team  -Per Dr. Burt Knack, ok to hold Plavix for procedure as he will be 3 months out from TAVR."  Last dose 07/14/2019 per nurse interview.   Per Dr. Sherren Mocha ok to hold Plavix.    VS: BP (!) 127/56 (BP Location: Left Arm)   Pulse 66   Temp 36.8 C (Oral)   Resp 18   Ht 5\' 2"  (1.575 m)   Wt 48.3 kg   SpO2 100%   BMI 19.46 kg/m   PROVIDERS: Lavone Orn, MD is PCP   Abran Cantor, MD is Cardiologist  Sherren Mocha, MD is Cardiologist  LABS: Chronic anemia, labs forwarded to surgeon (all labs ordered are listed, but only abnormal results are displayed)  Labs Reviewed  CBC - Abnormal; Notable for the following components:      Result Value   RBC 3.91 (*)    Hemoglobin 8.8 (*)    HCT 28.9 (*)    MCV 73.9 (*)    MCH 22.5 (*)    RDW 18.0 (*)    Platelets 120 (*)    All other components within normal limits  SURGICAL PCR SCREEN  BASIC METABOLIC PANEL     IMAGES: Carotid US 04/03/2019 Summary: Right Carotid: Velocities in the right ICA are consistent with a 1-39% stenosis.  Left Carotid: Velocities in the left ICA are consistent with a 1-39% stenosis.  Vertebrals: Right vertebral artery demonstrates antegrade flow. Left vertebral             artery was not visualized.  EKG: 04/29/2019 Rate 64 bpm Sinus rhythm  Left anterior fascicular block  Borderline T abnormalities, inferior leads Borderline ST elevation, anterior leads  Borderline prolonged QT interval  CV: Echo 05/14/2019 IMPRESSIONS   1. The average left ventricular global longitudinal strain is normal at -21.3 %.  2. The left ventricle has normal systolic function, with an ejection fraction of 55-60%. The cavity size was normal. Left ventricular diastolic Doppler parameters are consistent with pseudonormalization. No evidence of left ventricular regional wall  motion abnormalities.  3. The right ventricle has mildly reduced systolic function. The cavity was mildly enlarged. There is no increase in right  ventricular wall thickness. Right ventricular systolic pressure is mildly elevated with an estimated pressure of 38.1 mmHg.  4. Left atrial size was mildly dilated.  5. Right atrial size was severely dilated.  6. There is moderate mitral annular calcification present.  7. A 26 Edwards Sapien bioprosthetic aortic valve (TAVR) valve is present in the aortic position. Procedure Date: 04/15/19 Normal aortic valve prosthesis.  8. The aorta is normal unless otherwise noted.  9. - TAVR: S/P 108mm Edwards Sapien bioprosthetic AVR that appears to have normal function. The mean AVG is normal at 69mmHg, peak AV velocity 254 cm/s, AVA 1.84cm2 by VTI and dimensionless index 0.48. There is trivial perivalvular AI. 10. The inferior vena cava was normal in size with <50% respiratory variability. Past Medical History:  Diagnosis Date  . Aortic insufficiency 03/24/2019   AI of bioprosthetic AVR  . Chronic diastolic CHF (congestive heart failure) ([place]) 03/24/2019  . Colon polyps    s/p diverticular perforation requiring 2-stage repair  . Derangement of right shoulder joint    need replacing has no use of  . Diverticulosis   . DJD (degenerative joint disease), lumbosacral   . Dysphagia    eats soft food  . Esophageal stricture   . GERD (gastroesophageal reflux disease)   . Hemolytic anemia ([place])   . History of blood transfusion given with 04-15-19 surgery  . History of kidney stones    passed stones  . Hypertension   . Mitral regurgitation    moderate  . Occipital neuralgia   . Postoperative atrial fibrillation ([place]) 05/10/2015  . Prosthetic valve dysfunction   . Rheumatoid arthritis ([place])    s/o long term steroids  shoulders and hands  . Rotator cuff arthropathy    right  . S/P aortic valve replacement with bioprosthetic valve 01/16/2008   12mm Edwards Magna perimount bovine pericardial tissue valve, model 3000  . S/P valve-in-valve TAVR 04/15/2019   26 mm Edwards Sapien 3 Ultra transcatheter heart  valve placed via percutaneous right transfemoral approach   . SBE (subacute bacterial endocarditis) prophylaxis candidate    for dental procedures  . Severe aortic stenosis    S/P prosthetic valve replacement w 25 mm Edwards like science percardial tissue valve,[place] - 01/2008    Past Surgical History:  Procedure Laterality Date  . APPENDECTOMY    . BALLOON DILATION N/A 06/12/2019   Procedure: BALLOON DILATION;  Surgeon: Ronnette Juniper, MD;  Location: [facility];  Service: Gastroenterology;  Laterality: N/A;  . BOTOX INJECTION N/A 01/22/2018   Procedure: BOTOX INJECTION;  Surgeon: Ronnette Juniper, MD;  [facility];  Service: Gastroenterology;  Laterality: N/A;  . CARDIAC CATHETERIZATION     09  . CARDIAC VALVE REPLACEMENT  01/2008   aortic valve replacement  . CATARACT EXTRACTION W/ INTRAOCULAR LENS  IMPLANT, BILATERAL    . COLON RESECTION     diverticulitis   . dental implants     permanent  . ESOPHAGEAL MANOMETRY N/A 11/07/2017   Procedure: ESOPHAGEAL MANOMETRY (EM);  Surgeon: Ronnette Juniper, MD;  Location: [facility];  Service: Gastroenterology;  Laterality: N/A;  . ESOPHAGOGASTRODUODENOSCOPY (EGD) WITH PROPOFOL N/A 01/22/2018   Procedure: ESOPHAGOGASTRODUODENOSCOPY (EGD) WITH PROPOFOL;  Surgeon: Ronnette Juniper, MD;  [facility];  Service: Gastroenterology;  Laterality: N/A;  . ESOPHAGOGASTRODUODENOSCOPY (EGD) WITH PROPOFOL N/A 04/30/2019   Procedure: ESOPHAGOGASTRODUODENOSCOPY (EGD) WITH PROPOFOL;  Surgeon: Laurence Spates, MD;  Location: [employer];  Service: Endoscopy;  Laterality: N/A;  . ESOPHAGOGASTRODUODENOSCOPY (EGD) WITH PROPOFOL N/A 06/12/2019   Procedure: ESOPHAGOGASTRODUODENOSCOPY (EGD) WITH PROPOFOL;  Surgeon: Ronnette Juniper, MD;  [facility];  Service: Gastroenterology;  Laterality: N/A;  with botox injection  . EXCISIONAL TOTAL SHOULDER ARTHROPLASTY WITH ANTIBIOTIC SPACER Right 12/31/2018   Procedure: EXCISIONAL TOTAL SHOULDER ARTHROPLASTY WITH  ANTIBIOTIC SPACER;  Surgeon: Netta Cedars, MD;  Location: [place];  Service: Orthopedics;  Laterality: Right;  . HERNIA REPAIR    . INTRAOPERATIVE TRANSTHORACIC ECHOCARDIOGRAM N/A 04/15/2019   Procedure: Intraoperative Transthoracic Echocardiogram;  Surgeon: Sherren Mocha, MD;  Location: [place];  Service: Open Heart Surgery;  Laterality: N/A;  . IRRIGATION AND DEBRIDEMENT SHOULDER Right 11/20/2018    IRRIGATION AND DEBRIDEMENT SHOULDER WITH POLY EXCHANGE (Right Shoulder)  . IRRIGATION AND DEBRIDEMENT SHOULDER Right 11/20/2018   Procedure: IRRIGATION AND DEBRIDEMENT SHOULDER WITH POLY EXCHANGE;  Surgeon: Netta Cedars, MD;  Location: [place];  Service: Orthopedics;  Laterality: Right;  . LUMBAR LAMINECTOMY     x 2  . REVERSE SHOULDER ARTHROPLASTY Right 03/01/2018   Procedure: RIGHT REVERSE SHOULDER ARTHROPLASTY;  Surgeon: Netta Cedars, MD;  Location: [place];  Service: Orthopedics;  Laterality: Right;  . RIGHT/LEFT HEART CATH AND CORONARY ANGIOGRAPHY N/A 04/02/2019   Procedure: RIGHT/LEFT HEART CATH AND CORONARY ANGIOGRAPHY;  Surgeon: Leonie Man, MD;  Location: [place] [facility];  Service: Cardiovascular;  Laterality: N/A;  . SAVORY DILATION N/A 01/22/2018   Procedure: SAVORY DILATION;  Surgeon: Ronnette Juniper, MD;  [facility];  Service: Gastroenterology;  Laterality: N/A;  . SAVORY DILATION N/A 04/30/2019   Procedure: SAVORY DILATION;  Surgeon: Laurence Spates, MD;  Location: [place];  Service: Endoscopy;  Laterality: N/A;  With fluro  . SHOULDER HEMI-ARTHROPLASTY Right 06/14/2018   Procedure: RIGHT  REVERSE TOTAL SHOULDER OPEN POLY EXCHANGE;  Surgeon: Netta Cedars, MD;  Location: [place];  Service: Orthopedics;  Laterality: Right;  . SUBMUCOSAL INJECTION  06/12/2019   Procedure: SUBMUCOSAL INJECTION;  Surgeon: Ronnette Juniper, MD;  [facility];  Service: Gastroenterology;;  . TEE WITHOUT CARDIOVERSION N/A 01/29/2014   Procedure: TRANSESOPHAGEAL ECHOCARDIOGRAM (TEE);  Surgeon:  Sueanne Margarita, MD;  Location: [facility];  Service: Cardiovascular;  Laterality: N/A;  . TEE WITHOUT CARDIOVERSION N/A 04/02/2019   Procedure: TRANSESOPHAGEAL ECHOCARDIOGRAM (TEE);  Surgeon: Josue Hector, MD;  Location: [facility];  Service: Cardiovascular;  Laterality: N/A;  . TONSILLECTOMY    . TRANSCATHETER AORTIC VALVE REPLACEMENT, TRANSFEMORAL  04/15/2019  . TRANSCATHETER AORTIC VALVE REPLACEMENT, TRANSFEMORAL N/A 04/15/2019   Procedure: TRANSCATHETER AORTIC VALVE REPLACEMENT, TRANSFEMORAL with POST BALLOON DILATION;  Surgeon: Sherren Mocha, MD;  Location: [place];  Service: Open Heart Surgery;  Laterality: N/A;    MEDICATIONS: . acetaminophen (TYLENOL) 500 MG tablet  . ALPRAZolam (XANAX) 0.25 MG tablet  . aspirin EC 81 MG EC tablet  . cholecalciferol (VITAMIN D3) 25 MCG (1000 UT) tablet  . clopidogrel (PLAVIX) 75 MG tablet  . feeding supplement, ENSURE ENLIVE, (ENSURE ENLIVE) LIQD  . finasteride (PROSCAR) 5 MG tablet  . folic acid (FOLVITE) 1 MG tablet  . furosemide (  LASIX) 40 MG tablet  . MEGARED OMEGA-3 KRILL OIL PO  . Melatonin 3 MG TABS  . metoprolol succinate (TOPROL-XL) 25 MG 24 hr tablet  . Multiple Vitamin (MULTIVITAMIN WITH MINERALS) TABS tablet  . pantoprazole (PROTONIX) 40 MG tablet  . potassium chloride SA (K-DUR) 20 MEQ tablet  . predniSONE (DELTASONE) 5 MG tablet  . THERATEARS 0.25 % SOLN  . Turmeric 500 MG CAPS  . vitamin B-12 (CYANOCOBALAMIN) 1000 MCG tablet   No current facility-administered medications for this encounter.     Maia Plan [facility] Pre-Surgical Testing [phone] 07/16/19  10:53 AM

## 2019-07-16 NOTE — Telephone Encounter (Signed)
I have faxed this over to the surgeon. Pt was cleared on 06/18/19.

## 2019-07-17 LAB — NOVEL CORONAVIRUS, NAA (HOSP ORDER, SEND-OUT TO REF LAB; TAT 18-24 HRS): SARS-CoV-2, NAA: NOT DETECTED

## 2019-07-18 ENCOUNTER — Inpatient Hospital Stay (HOSPITAL_COMMUNITY): Payer: Medicare Other | Admitting: Physician Assistant

## 2019-07-18 ENCOUNTER — Encounter (HOSPITAL_COMMUNITY)
Admission: RE | Disposition: A | Discharge status: Home / Self Care | Payer: Self-pay | Source: Ambulatory Visit | Attending: Orthopedic Surgery

## 2019-07-18 ENCOUNTER — Inpatient Hospital Stay (HOSPITAL_COMMUNITY)
Admission: RE | Admit: 2019-07-18 | Discharge: 2019-07-19 | DRG: 483 | Disposition: A | Discharge status: Home / Self Care | Payer: Medicare Other | Attending: Orthopedic Surgery | Admitting: Orthopedic Surgery

## 2019-07-18 ENCOUNTER — Inpatient Hospital Stay (HOSPITAL_COMMUNITY): Payer: Medicare Other

## 2019-07-18 ENCOUNTER — Other Ambulatory Visit: Payer: Self-pay

## 2019-07-18 ENCOUNTER — Inpatient Hospital Stay (HOSPITAL_COMMUNITY): Payer: Medicare Other | Admitting: Anesthesiology

## 2019-07-18 ENCOUNTER — Encounter (HOSPITAL_COMMUNITY): Payer: Self-pay | Admitting: Emergency Medicine

## 2019-07-18 DIAGNOSIS — M069 Rheumatoid arthritis, unspecified: Secondary | ICD-10-CM | POA: Diagnosis present

## 2019-07-18 DIAGNOSIS — Z87442 Personal history of urinary calculi: Secondary | ICD-10-CM

## 2019-07-18 DIAGNOSIS — Z9289 Personal history of other medical treatment: Secondary | ICD-10-CM

## 2019-07-18 DIAGNOSIS — K5790 Diverticulosis of intestine, part unspecified, without perforation or abscess without bleeding: Secondary | ICD-10-CM | POA: Diagnosis present

## 2019-07-18 DIAGNOSIS — Z4731 Aftercare following explantation of shoulder joint prosthesis: Secondary | ICD-10-CM | POA: Diagnosis present

## 2019-07-18 DIAGNOSIS — Z96611 Presence of right artificial shoulder joint: Secondary | ICD-10-CM

## 2019-07-18 DIAGNOSIS — I110 Hypertensive heart disease with heart failure: Secondary | ICD-10-CM | POA: Diagnosis present

## 2019-07-18 DIAGNOSIS — K219 Gastro-esophageal reflux disease without esophagitis: Secondary | ICD-10-CM | POA: Diagnosis present

## 2019-07-18 DIAGNOSIS — Z7952 Long term (current) use of systemic steroids: Secondary | ICD-10-CM | POA: Diagnosis not present

## 2019-07-18 DIAGNOSIS — Z7902 Long term (current) use of antithrombotics/antiplatelets: Secondary | ICD-10-CM

## 2019-07-18 DIAGNOSIS — Z7982 Long term (current) use of aspirin: Secondary | ICD-10-CM

## 2019-07-18 DIAGNOSIS — Z20828 Contact with and (suspected) exposure to other viral communicable diseases: Secondary | ICD-10-CM | POA: Diagnosis present

## 2019-07-18 DIAGNOSIS — Z953 Presence of xenogenic heart valve: Secondary | ICD-10-CM

## 2019-07-18 DIAGNOSIS — I5032 Chronic diastolic (congestive) heart failure: Secondary | ICD-10-CM | POA: Diagnosis present

## 2019-07-18 DIAGNOSIS — Z79899 Other long term (current) drug therapy: Secondary | ICD-10-CM | POA: Diagnosis not present

## 2019-07-18 DIAGNOSIS — Z79890 Hormone replacement therapy (postmenopausal): Secondary | ICD-10-CM | POA: Diagnosis not present

## 2019-07-18 HISTORY — DX: Personal history of other medical treatment: Z92.89

## 2019-07-18 HISTORY — PX: REVERSE SHOULDER ARTHROPLASTY: SHX5054

## 2019-07-18 LAB — POCT I-STAT, CHEM 8
BUN: 12 mg/dL (ref 8–23)
Calcium, Ion: 1.12 mmol/L — ABNORMAL LOW (ref 1.15–1.40)
Chloride: 105 mmol/L (ref 98–111)
Creatinine, Ser: 0.9 mg/dL (ref 0.61–1.24)
Glucose, Bld: 98 mg/dL (ref 70–99)
HCT: 20 % — ABNORMAL LOW (ref 39.0–52.0)
Hemoglobin: 6.8 g/dL — CL (ref 13.0–17.0)
Potassium: 4.1 mmol/L (ref 3.5–5.1)
Sodium: 140 mmol/L (ref 135–145)
TCO2: 27 mmol/L (ref 22–32)

## 2019-07-18 LAB — PREPARE RBC (CROSSMATCH)

## 2019-07-18 LAB — ABO/RH: ABO/RH(D): A POS

## 2019-07-18 SURGERY — ARTHROPLASTY, SHOULDER, TOTAL, REVERSE
Anesthesia: General | Site: Shoulder | Laterality: Right

## 2019-07-18 MED ORDER — VITAMIN D 25 MCG (1000 UNIT) PO TABS
1000.0000 [IU] | ORAL_TABLET | Freq: Every day | ORAL | Status: DC
  Administered 2019-07-19: 1000 [IU] via ORAL
  Filled 2019-07-18: qty 1

## 2019-07-18 MED ORDER — MORPHINE SULFATE (PF) 2 MG/ML IV SOLN: 0.5000 mg | INTRAVENOUS | Status: DC | PRN

## 2019-07-18 MED ORDER — CEFAZOLIN SODIUM-DEXTROSE 2-4 GM/100ML-% IV SOLN
2.0000 g | INTRAVENOUS | Status: AC
  Administered 2019-07-18: 2 g via INTRAVENOUS
  Filled 2019-07-18: qty 100

## 2019-07-18 MED ORDER — ACETAMINOPHEN 500 MG PO TABS: 1000.0000 mg | ORAL_TABLET | Freq: Four times a day (QID) | ORAL | Status: DC | PRN

## 2019-07-18 MED ORDER — LIDOCAINE 2% (20 MG/ML) 5 ML SYRINGE
INTRAMUSCULAR | Status: DC | PRN
  Administered 2019-07-18: 40 mg via INTRAVENOUS

## 2019-07-18 MED ORDER — PROPOFOL 10 MG/ML IV BOLUS
INTRAVENOUS | Status: AC
  Filled 2019-07-18: qty 20

## 2019-07-18 MED ORDER — ONDANSETRON HCL 4 MG/2ML IJ SOLN
INTRAMUSCULAR | Status: DC | PRN
  Administered 2019-07-18: 4 mg via INTRAVENOUS

## 2019-07-18 MED ORDER — MELATONIN 3 MG PO TABS: 3.0000 mg | ORAL_TABLET | Freq: Every evening | ORAL | Status: DC | PRN

## 2019-07-18 MED ORDER — LACTATED RINGERS IV SOLN
INTRAVENOUS | Status: DC
  Administered 2019-07-18: 06:00:00 via INTRAVENOUS

## 2019-07-18 MED ORDER — SODIUM CHLORIDE 0.9 % IR SOLN
Status: DC | PRN
  Administered 2019-07-18: 3000 mL

## 2019-07-18 MED ORDER — ASPIRIN EC 81 MG PO TBEC
81.0000 mg | DELAYED_RELEASE_TABLET | Freq: Every day | ORAL | Status: DC
  Administered 2019-07-19: 81 mg via ORAL
  Filled 2019-07-18: qty 1

## 2019-07-18 MED ORDER — ONDANSETRON HCL 4 MG PO TABS: 4.0000 mg | ORAL_TABLET | Freq: Four times a day (QID) | ORAL | Status: DC | PRN

## 2019-07-18 MED ORDER — ACETAMINOPHEN 500 MG PO TABS
1000.0000 mg | ORAL_TABLET | Freq: Once | ORAL | Status: AC
  Administered 2019-07-18: 06:00:00 1000 mg via ORAL

## 2019-07-18 MED ORDER — METOCLOPRAMIDE HCL 5 MG PO TABS: 5.0000 mg | ORAL_TABLET | Freq: Three times a day (TID) | ORAL | Status: DC | PRN

## 2019-07-18 MED ORDER — PROPOFOL 10 MG/ML IV BOLUS
INTRAVENOUS | Status: DC | PRN
  Administered 2019-07-18: 80 mg via INTRAVENOUS

## 2019-07-18 MED ORDER — METHOCARBAMOL 500 MG IVPB - SIMPLE MED
500.0000 mg | Freq: Four times a day (QID) | INTRAVENOUS | Status: DC | PRN
  Filled 2019-07-18: qty 50

## 2019-07-18 MED ORDER — SUGAMMADEX SODIUM 200 MG/2ML IV SOLN
INTRAVENOUS | Status: DC | PRN
  Administered 2019-07-18: 175 mg via INTRAVENOUS

## 2019-07-18 MED ORDER — FENTANYL CITRATE (PF) 250 MCG/5ML IJ SOLN
INTRAMUSCULAR | Status: DC | PRN
  Administered 2019-07-18: 25 ug via INTRAVENOUS
  Administered 2019-07-18: 75 ug via INTRAVENOUS
  Administered 2019-07-18 (×2): 25 ug via INTRAVENOUS

## 2019-07-18 MED ORDER — MIDAZOLAM HCL 2 MG/2ML IJ SOLN
INTRAMUSCULAR | Status: DC | PRN
  Administered 2019-07-18 (×3): 0.5 mg via INTRAVENOUS

## 2019-07-18 MED ORDER — POTASSIUM CHLORIDE CRYS ER 20 MEQ PO TBCR
20.0000 meq | EXTENDED_RELEASE_TABLET | Freq: Every day | ORAL | Status: DC
  Administered 2019-07-18 – 2019-07-19 (×2): 20 meq via ORAL
  Filled 2019-07-18 (×2): qty 1

## 2019-07-18 MED ORDER — BUPIVACAINE-EPINEPHRINE (PF) 0.25% -1:200000 IJ SOLN
INTRAMUSCULAR | Status: DC | PRN
  Administered 2019-07-18: 5 mL

## 2019-07-18 MED ORDER — SODIUM CHLORIDE 0.9% IV SOLUTION: Freq: Once | INTRAVENOUS | Status: DC

## 2019-07-18 MED ORDER — POLYVINYL ALCOHOL 1.4 % OP SOLN: 1.0000 [drp] | OPHTHALMIC | Status: DC | PRN

## 2019-07-18 MED ORDER — ACETAMINOPHEN 325 MG PO TABS: 325.0000 mg | ORAL_TABLET | Freq: Four times a day (QID) | ORAL | Status: DC | PRN

## 2019-07-18 MED ORDER — ONDANSETRON HCL 4 MG/2ML IJ SOLN: 4.0000 mg | Freq: Four times a day (QID) | INTRAMUSCULAR | Status: DC | PRN

## 2019-07-18 MED ORDER — FINASTERIDE 5 MG PO TABS
5.0000 mg | ORAL_TABLET | Freq: Every day | ORAL | Status: DC
  Administered 2019-07-19: 5 mg via ORAL
  Filled 2019-07-18: qty 1

## 2019-07-18 MED ORDER — SODIUM CHLORIDE 0.9 % IR SOLN
Status: DC | PRN
  Administered 2019-07-18: 1000 mL

## 2019-07-18 MED ORDER — METHOCARBAMOL 500 MG PO TABS: 500.0000 mg | ORAL_TABLET | Freq: Four times a day (QID) | ORAL | Status: DC | PRN

## 2019-07-18 MED ORDER — PHENYLEPHRINE HCL-NACL 10-0.9 MG/250ML-% IV SOLN
INTRAVENOUS | Status: DC | PRN
  Administered 2019-07-18: 10 ug/min via INTRAVENOUS

## 2019-07-18 MED ORDER — HYDROCODONE-ACETAMINOPHEN 5-325 MG PO TABS: 1.0000 | ORAL_TABLET | Freq: Four times a day (QID) | ORAL | 0 refills | Status: DC | PRN

## 2019-07-18 MED ORDER — METOCLOPRAMIDE HCL 5 MG/ML IJ SOLN: 5.0000 mg | Freq: Three times a day (TID) | INTRAMUSCULAR | Status: DC | PRN

## 2019-07-18 MED ORDER — CHLORHEXIDINE GLUCONATE 4 % EX LIQD: 60.0000 mL | Freq: Once | CUTANEOUS | Status: DC

## 2019-07-18 MED ORDER — DEXAMETHASONE SODIUM PHOSPHATE 10 MG/ML IJ SOLN
INTRAMUSCULAR | Status: DC | PRN
  Administered 2019-07-18: 5 mg via INTRAVENOUS

## 2019-07-18 MED ORDER — FENTANYL CITRATE (PF) 250 MCG/5ML IJ SOLN
INTRAMUSCULAR | Status: AC
  Filled 2019-07-18: qty 5

## 2019-07-18 MED ORDER — POLYETHYLENE GLYCOL 3350 17 G PO PACK: 17.0000 g | PACK | Freq: Every day | ORAL | Status: DC | PRN

## 2019-07-18 MED ORDER — BUPIVACAINE-EPINEPHRINE (PF) 0.5% -1:200000 IJ SOLN
INTRAMUSCULAR | Status: DC | PRN
  Administered 2019-07-18: 15 mL via PERINEURAL

## 2019-07-18 MED ORDER — ACETAMINOPHEN 500 MG PO TABS
500.0000 mg | ORAL_TABLET | Freq: Four times a day (QID) | ORAL | Status: AC
  Administered 2019-07-18 – 2019-07-19 (×4): 500 mg via ORAL
  Filled 2019-07-18 (×4): qty 1

## 2019-07-18 MED ORDER — FENTANYL CITRATE (PF) 100 MCG/2ML IJ SOLN: 25.0000 ug | INTRAMUSCULAR | Status: DC | PRN

## 2019-07-18 MED ORDER — CARBOXYMETHYLCELLULOSE SODIUM 0.25 % OP SOLN: 1.0000 [drp] | Freq: Three times a day (TID) | OPHTHALMIC | Status: DC | PRN

## 2019-07-18 MED ORDER — VITAMIN B-12 1000 MCG PO TABS
1000.0000 ug | ORAL_TABLET | Freq: Every day | ORAL | Status: DC
  Administered 2019-07-19: 1000 ug via ORAL
  Filled 2019-07-18: qty 1

## 2019-07-18 MED ORDER — ROCURONIUM BROMIDE 10 MG/ML (PF) SYRINGE
PREFILLED_SYRINGE | INTRAVENOUS | Status: DC | PRN
  Administered 2019-07-18: 40 mg via INTRAVENOUS

## 2019-07-18 MED ORDER — MEGARED OMEGA-3 KRILL OIL 500 MG PO CAPS: 750.0000 mg | ORAL_CAPSULE | Freq: Every day | ORAL | Status: DC

## 2019-07-18 MED ORDER — BUPIVACAINE LIPOSOME 1.3 % IJ SUSP
INTRAMUSCULAR | Status: DC | PRN
  Administered 2019-07-18: 10 mL via PERINEURAL

## 2019-07-18 MED ORDER — DOCUSATE SODIUM 100 MG PO CAPS
100.0000 mg | ORAL_CAPSULE | Freq: Two times a day (BID) | ORAL | Status: DC
  Administered 2019-07-19 (×2): 100 mg via ORAL
  Filled 2019-07-18 (×2): qty 1

## 2019-07-18 MED ORDER — PREDNISONE 5 MG PO TABS
5.0000 mg | ORAL_TABLET | Freq: Every day | ORAL | Status: DC
  Administered 2019-07-19: 5 mg via ORAL
  Filled 2019-07-18: qty 1

## 2019-07-18 MED ORDER — PHENOL 1.4 % MT LIQD: 1.0000 | OROMUCOSAL | Status: DC | PRN

## 2019-07-18 MED ORDER — SUCCINYLCHOLINE CHLORIDE 200 MG/10ML IV SOSY
PREFILLED_SYRINGE | INTRAVENOUS | Status: DC | PRN
  Administered 2019-07-18: 40 mg via INTRAVENOUS
  Administered 2019-07-18: 80 mg via INTRAVENOUS

## 2019-07-18 MED ORDER — PANTOPRAZOLE SODIUM 40 MG PO TBEC
40.0000 mg | DELAYED_RELEASE_TABLET | Freq: Every day | ORAL | Status: DC
  Administered 2019-07-19: 40 mg via ORAL
  Filled 2019-07-18: qty 1

## 2019-07-18 MED ORDER — CEFAZOLIN SODIUM-DEXTROSE 2-4 GM/100ML-% IV SOLN
2.0000 g | Freq: Four times a day (QID) | INTRAVENOUS | Status: AC
  Administered 2019-07-18 – 2019-07-19 (×3): 2 g via INTRAVENOUS
  Filled 2019-07-18 (×3): qty 100

## 2019-07-18 MED ORDER — SODIUM CHLORIDE 0.9 % IV SOLN
INTRAVENOUS | Status: DC
  Administered 2019-07-18: 12:00:00 via INTRAVENOUS

## 2019-07-18 MED ORDER — CLOPIDOGREL BISULFATE 75 MG PO TABS
75.0000 mg | ORAL_TABLET | Freq: Every day | ORAL | Status: DC
  Administered 2019-07-19: 75 mg via ORAL
  Filled 2019-07-18: qty 1

## 2019-07-18 MED ORDER — MENTHOL 3 MG MT LOZG: 1.0000 | LOZENGE | OROMUCOSAL | Status: DC | PRN

## 2019-07-18 MED ORDER — MIDAZOLAM HCL 2 MG/2ML IJ SOLN
INTRAMUSCULAR | Status: AC
  Filled 2019-07-18: qty 2

## 2019-07-18 MED ORDER — ALBUMIN HUMAN 5 % IV SOLN
INTRAVENOUS | Status: DC | PRN
  Administered 2019-07-18: 08:00:00 via INTRAVENOUS

## 2019-07-18 MED ORDER — TURMERIC 500 MG PO CAPS: 500.0000 mg | ORAL_CAPSULE | Freq: Every day | ORAL | Status: DC

## 2019-07-18 MED ORDER — ALPRAZOLAM 0.25 MG PO TABS: 0.2500 mg | ORAL_TABLET | Freq: Every evening | ORAL | Status: DC | PRN

## 2019-07-18 MED ORDER — FOLIC ACID 1 MG PO TABS
1.0000 mg | ORAL_TABLET | Freq: Every day | ORAL | Status: DC
  Administered 2019-07-19: 10:00:00 1 mg via ORAL
  Filled 2019-07-18: qty 1

## 2019-07-18 MED ORDER — ADULT MULTIVITAMIN W/MINERALS CH
1.0000 | ORAL_TABLET | Freq: Every day | ORAL | Status: DC
  Administered 2019-07-19: 1 via ORAL
  Filled 2019-07-18: qty 1

## 2019-07-18 MED ORDER — ACETAMINOPHEN 500 MG PO TABS
ORAL_TABLET | ORAL | Status: AC
  Administered 2019-07-18: 1000 mg via ORAL
  Filled 2019-07-18: qty 2

## 2019-07-18 MED ORDER — HYDROCODONE-ACETAMINOPHEN 5-325 MG PO TABS: 1.0000 | ORAL_TABLET | ORAL | Status: DC | PRN

## 2019-07-18 MED ORDER — BUPIVACAINE-EPINEPHRINE 0.25% -1:200000 IJ SOLN
INTRAMUSCULAR | Status: AC
  Filled 2019-07-18: qty 1

## 2019-07-18 MED ORDER — METOPROLOL SUCCINATE ER 25 MG PO TB24
25.0000 mg | ORAL_TABLET | Freq: Every day | ORAL | Status: DC
  Administered 2019-07-19: 25 mg via ORAL
  Filled 2019-07-18: qty 1

## 2019-07-18 MED ORDER — FUROSEMIDE 40 MG PO TABS
40.0000 mg | ORAL_TABLET | Freq: Every day | ORAL | Status: DC
  Administered 2019-07-18 – 2019-07-19 (×2): 40 mg via ORAL
  Filled 2019-07-18 (×2): qty 1

## 2019-07-18 SURGICAL SUPPLY — 75 items
BAG ZIPLOCK 12X15 (MISCELLANEOUS) IMPLANT
BEARING HUMERAL SHLDER 36M STD (Shoulder) ×1 IMPLANT
BIT DRILL 1.6MX128 (BIT) IMPLANT
BIT DRILL 1.6MX128MM (BIT)
BIT DRILL 2.7 W/STOP DISP (BIT) ×2 IMPLANT
BIT DRILL 2.7MM W/STOP DISP (BIT) ×1
BIT DRILL F/CENTRAL SCRW 3.2 (BIT) ×1
BIT DRILL F/CENTRAL SCRW 3.2MM (BIT) ×1 IMPLANT
BIT DRILL TWIST 2.7 (BIT) ×2 IMPLANT
BIT DRILL TWIST 2.7MM (BIT) ×1
BLADE SAG 18X100X1.27 (BLADE) IMPLANT
BRUSH FEMORAL CANAL (MISCELLANEOUS) ×3 IMPLANT
CLOSURE WOUND 1/2 X4 (GAUZE/BANDAGES/DRESSINGS)
COMP REV AUG LG W/TAPER/GLENOI (Joint) ×3 IMPLANT
COMPONENT RV AUG LG W/TAPR/GLN (Joint) ×1 IMPLANT
CONT SPEC 4OZ CLIKSEAL STRL BL (MISCELLANEOUS) ×3 IMPLANT
COVER BACK TABLE 60X90IN (DRAPES) ×3 IMPLANT
COVER SURGICAL LIGHT HANDLE (MISCELLANEOUS) ×3 IMPLANT
COVER WAND RF STERILE (DRAPES) IMPLANT
DECANTER SPIKE VIAL GLASS SM (MISCELLANEOUS) ×3 IMPLANT
DRAPE INCISE IOBAN 66X45 STRL (DRAPES) ×3 IMPLANT
DRAPE ORTHO SPLIT 77X108 STRL (DRAPES) ×4
DRAPE SHEET LG 3/4 BI-LAMINATE (DRAPES) ×3 IMPLANT
DRAPE SURG ORHT 6 SPLT 77X108 (DRAPES) ×2 IMPLANT
DRAPE U-SHAPE 47X51 STRL (DRAPES) ×3 IMPLANT
DRILL BIT F/CENTRAL SCRW 3.2MM (BIT) ×2
DRSG ADAPTIC 3X8 NADH LF (GAUZE/BANDAGES/DRESSINGS) ×3 IMPLANT
DRSG PAD ABDOMINAL 8X10 ST (GAUZE/BANDAGES/DRESSINGS) ×3 IMPLANT
DURAPREP 26ML APPLICATOR (WOUND CARE) ×3 IMPLANT
ELECT BLADE TIP CTD 4 INCH (ELECTRODE) ×3 IMPLANT
ELECT NEEDLE TIP 2.8 STRL (NEEDLE) ×3 IMPLANT
ELECT REM PT RETURN 15FT ADLT (MISCELLANEOUS) ×3 IMPLANT
GAUZE SPONGE 4X4 12PLY STRL (GAUZE/BANDAGES/DRESSINGS) ×3 IMPLANT
GLENOID SPHERE 36MM CVD +3 (Orthopedic Implant) ×3 IMPLANT
GLOVE BIOGEL PI ORTHO PRO 7.5 (GLOVE) ×2
GLOVE BIOGEL PI ORTHO PRO SZ8 (GLOVE) ×2
GLOVE ORTHO TXT STRL SZ7.5 (GLOVE) ×3 IMPLANT
GLOVE PI ORTHO PRO STRL 7.5 (GLOVE) ×1 IMPLANT
GLOVE PI ORTHO PRO STRL SZ8 (GLOVE) ×1 IMPLANT
GLOVE SURG ORTHO 8.5 STRL (GLOVE) ×3 IMPLANT
GOWN STRL REUS W/TWL XL LVL3 (GOWN DISPOSABLE) ×6 IMPLANT
HANDPIECE INTERPULSE COAX TIP (DISPOSABLE) ×2
KIT BASIN OR (CUSTOM PROCEDURE TRAY) ×3 IMPLANT
KIT TURNOVER KIT A (KITS) IMPLANT
MANIFOLD NEPTUNE II (INSTRUMENTS) ×3 IMPLANT
NEEDLE MAYO CATGUT SZ4 (NEEDLE) IMPLANT
PACK SHOULDER (CUSTOM PROCEDURE TRAY) ×3 IMPLANT
REAMER GUIDE BUSHING SURG DISP (MISCELLANEOUS) ×3 IMPLANT
REAMER GUIDE W/SCREW AUG (MISCELLANEOUS) ×3 IMPLANT
RESTRAINT HEAD UNIVERSAL NS (MISCELLANEOUS) ×3 IMPLANT
SCREW BONE LOCKING 4.75X30X3.5 (Screw) ×3 IMPLANT
SCREW CENTRAL 6.5X40 (Screw) ×3 IMPLANT
SCREW LOCKING 4.75MMX15MM (Screw) ×3 IMPLANT
SCREW LOCKING NS 4.75MMX20MM (Screw) ×3 IMPLANT
SET HNDPC FAN SPRY TIP SCT (DISPOSABLE) ×1 IMPLANT
SHOULDER HUMERAL BEAR 36M STD (Shoulder) ×3 IMPLANT
SLING ARM FOAM STRAP MED (SOFTGOODS) ×3 IMPLANT
SMARTMIX MINI TOWER (MISCELLANEOUS)
SPONGE LAP 4X18 RFD (DISPOSABLE) IMPLANT
STAPLER VISISTAT (STAPLE) ×3 IMPLANT
STEM MINI LONG 15MM 83MM (Stem) ×3 IMPLANT
STRIP CLOSURE SKIN 1/2X4 (GAUZE/BANDAGES/DRESSINGS) IMPLANT
SUCTION FRAZIER HANDLE 10FR (MISCELLANEOUS) ×2
SUCTION TUBE FRAZIER 10FR DISP (MISCELLANEOUS) ×1 IMPLANT
SUT FIBERWIRE #2 38 T-5 BLUE (SUTURE) ×3
SUT MNCRL AB 4-0 PS2 18 (SUTURE) ×3 IMPLANT
SUT VIC AB 0 CT1 36 (SUTURE) ×6 IMPLANT
SUT VIC AB 0 CT2 27 (SUTURE) ×3 IMPLANT
SUT VIC AB 2-0 CT1 27 (SUTURE) ×2
SUT VIC AB 2-0 CT1 TAPERPNT 27 (SUTURE) ×1 IMPLANT
SUTURE FIBERWR #2 38 T-5 BLUE (SUTURE) ×1 IMPLANT
TAPE CLOTH SURG 6X10 WHT LF (GAUZE/BANDAGES/DRESSINGS) ×3 IMPLANT
TOWEL OR 17X26 10 PK STRL BLUE (TOWEL DISPOSABLE) ×3 IMPLANT
TOWER SMARTMIX MINI (MISCELLANEOUS) IMPLANT
TRAY HUM MINI SHOULDER +3 40 (Joint) ×3 IMPLANT

## 2019-07-18 NOTE — Anesthesia Procedure Notes (Signed)
Procedure Name: Intubation Date/Time: 07/18/2019 7:57 AM Performed by: Cynda Familia, CRNA Pre-anesthesia Checklist: Patient identified, Emergency Drugs available, Suction available and Patient being monitored Patient Re-evaluated:Patient Re-evaluated prior to induction Oxygen Delivery Method: Circle System Utilized Preoxygenation: Pre-oxygenation with 100% oxygen Induction Type: IV induction Ventilation: Mask ventilation without difficulty Laryngoscope Size: Miller and 2 Grade View: Grade I Tube type: Oral Tube size: 7.0 mm Number of attempts: 1 Airway Equipment and Method: Stylet Placement Confirmation: ETT inserted through vocal cords under direct vision,  positive ETCO2 and breath sounds checked- equal and bilateral Secured at: 21 cm Tube secured with: Tape Dental Injury: Teeth and Oropharynx as per pre-operative assessment  Comments: Smooth IV induction AM CRNA-- atraumatic-- teeth and mouth as preop --bilat BS Fitzgerald-- limited neck mobility and mouth opening-- sux for induction

## 2019-07-18 NOTE — Anesthesia Procedure Notes (Signed)
Date/Time: 07/18/2019 9:38 AM Performed by: Cynda Familia, CRNA Oxygen Delivery Method: Simple face mask Placement Confirmation: positive ETCO2 and breath sounds checked- equal and bilateral Dental Injury: Teeth and Oropharynx as per pre-operative assessment

## 2019-07-18 NOTE — Op Note (Signed)
NAME: WILSON, BRAR MEDICAL RECORD [id number] ACCOUNT [id number] DATE OF BIRTH:1935-03-24 FAC[facility] LOCATION: [facility] PHYSICIAN:STEVEN R. Alaijah Gibler, MD  OPERATIVE REPORT  DATE OF PROCEDURE:  07/18/2019  PREOPERATIVE DIAGNOSIS:  Right shoulder failed reversed total shoulder replacement secondary to infection with removal of implant and placement of antibiotic spacer.  POSTOPERATIVE DIAGNOSIS:  Right shoulder failed reversed total shoulder replacement secondary to infection with removal of implant and placement of antibiotic spacer.  PROCEDURE PERFORMED:  Removal of antibiotic spacer and revision right reversed total shoulder arthroplasty using Biomet comprehensive system with augmented baseplate.  ATTENDING SURGEON:  Esmond Plants, MD  ASSISTANT:  Darol Destine, [place], who was scrubbed during the entire procedure and necessary for satisfactory completion of surgery.  ANESTHESIA:  General anesthesia was used plus interscalene block.  ESTIMATED BLOOD LOSS:  150 mL.  FLUID REPLACEMENT:  100 mL crystalloid, 500 mL albumin.  INSTRUMENT COUNTS:  Correct.  COMPLICATIONS:  No complications.  ANTIBIOTICS:  Perioperative antibiotics were given.  DESCRIPTION OF PROCEDURE:  The patient is an 84 year old male with a history of prior reverse shoulder replacement on the right, complicated by multiple dislocations.  The patient developed a remote P. acnes infection requiring removal of his implant and  placement of antibiotic spacer.  The patient has completed his IV and oral antibiotic courses and presents now with significant disability with the shoulder, to have his antibiotic spacer removed and placement of a reversed total shoulder replacement.   Risks and benefits of surgery discussed in detail with the patient.  Informed consent obtained.  DESCRIPTION OF PROCEDURE:  After an adequate level of anesthesia was achieved, the patient was positioned in modified beach chair position.   Right shoulder correctly identified and sterilely prepped and draped in the usual manner.  Time-out called,  verifying correct patient, correct site.  We used the patient's prior deltopectoral incision with a 10 blade scalpel, dissection down through the subcutaneous tissues using Bovie.  We identified the deltopectoral interval and divided that using the  Bovie.  We immediately encountered a clear seroma0type fluid.  The humeral prosthesis was riding very anterior and medial.  We were able to easily dislocate this prosthesis and using a bone tamp, removed the antibiotic spacer.  We then used a pulse  lavage with the brush to remove soft tissue from the canal.  We were able to get that soft tissue and send that for micro, for Gram stain and culture.  Once we had some provisional cleaning on the humeral side done, we subluxed the humerus posteriorly  and placed retractors.  Unfortunately, the patient had extreme bone loss on the glenoid side with  significant anterior bone loss.  Really the front half of his glenoid was gone  due to the antibiotic spacer prosthesis riding against that,  so we knew we  would have to do an augmented baseplate.  We used the Biomet comprehensive system.  We set up for that with the large baseplate guide and drilled our central drill pin.  We then did our initial reaming for the baseplate.  We then placed our separate  guide for reaming the anterior augmented portion of the glenoid.  Once we had glenoid preparation done, we drilled out our central hole.  We impacted our implant into position with the appropriate version and we actually had really an anatomic fit with  that augmented baseplate. This was the largest augmented that Biomet has.  Once that was in position, we placed a 46.5 compression screw in  the central portion of the baseplate.  We then were able to use a 30 screw proximally which was locked and a 20  screw inferiorly locked and a 15 screw posteriorly locked, so we  had 3 good screws.  The anterior screw was not going to get any purchase and again we did not want to compromise the bony support for this augmented baseplate.  With that baseplate in  place, we selected a 36+3 offset, set on the B setting glenosphere and then impacted that with that offset inferiorly.  We were pleased with that position and stability.  We irrigated thoroughly with pulse lavage.  We then went ahead and completed our  preparation for the humeral side.  We initially reamed the distal canal.  We did some more brushing with the pulse lavage and then reamed the distal canal up to a size 15.  We then used 13, 14, 15 broaches and 20 degrees of retroversion and impacted that  in position.  We used that then as a trial.  We then used the small baseplate, which was a +3 offset and the standard poly.  We reduced the shoulder.  We were happy with our soft tissue balancing and stability.  We removed all trial components from the  humeral side, pulse irrigated and then used the press-fit stem, which was a size 15 stem, impacted in 20 degrees of retroversion to the same depth.  We then went ahead and used the +3 offset tray with the standard poly and impacted that in position,  reduced the shoulder.  It had a nice little pop when it reduced.  It was inherently stable.  No tendency to dislocate.  When I pulled on the arm, everything translated together.  No gapping with external rotation and no impingement.  We irrigated  thoroughly and then repaired the deltopectoral interval with 0 Vicryl suture, followed by 2-0 Vicryl for subcutaneous closure and staples for skin.  A sterile dressing applied, followed by a shoulder sling.  The patient transported to recovery room in  stable condition.  VN/NUANCE  D:07/18/2019 T:07/18/2019 JOB:[id number]

## 2019-07-18 NOTE — Transfer of Care (Signed)
Immediate Anesthesia Transfer of Care Note  Patient: Joshua Hudson  Procedure(s) Performed: REVERSE TOTAL SHOULDER ARTHROPLASTY and removal of antiobotic spacer (Right Shoulder)  Patient Location: PACU  Anesthesia Type:General  Level of Consciousness: awake and alert   Airway & Oxygen Therapy: Patient Spontanous Breathing and Patient connected to face mask oxygen  Post-op Assessment: Report given to RN and Post -op Vital signs reviewed and stable  Post vital signs: Reviewed and stable  Last Vitals:  Vitals Value Taken Time  BP 159/85 07/18/19 0948  Temp    Pulse 59 07/18/19 0951  Resp 22 07/18/19 0951  SpO2 100 % 07/18/19 0951  Vitals shown include unvalidated device data.  Last Pain:  Vitals:   07/18/19 0605  TempSrc: Oral  PainSc:       Patients Stated Pain Goal: 4 ([id number])  Complications: No apparent anesthesia complications

## 2019-07-18 NOTE — Interval H&P Note (Signed)
History and Physical Interval Note:  07/18/2019 7:40 AM  Joshua Hudson  has presented today for surgery, with the diagnosis of Right shoulder antiobotic spacer shoulder pain.  The various methods of treatment have been discussed with the patient and family. After consideration of risks, benefits and other options for treatment, the patient has consented to  Procedure(s) with comments: REVERSE SHOULDER ARTHROPLASTY and removal of antiobotic spacer (Right) - interscalene block as a surgical intervention.  The patient's history has been reviewed, patient examined, no change in status, stable for surgery.  I have reviewed the patient's chart and labs.  Questions were answered to the patient's satisfaction.     [name]

## 2019-07-18 NOTE — Anesthesia Postprocedure Evaluation (Signed)
Anesthesia Post Note  Patient: Joshua Hudson  Procedure(s) Performed: REVERSE TOTAL SHOULDER ARTHROPLASTY and removal of antiobotic spacer (Right Shoulder)     Patient location during evaluation: PACU Anesthesia Type: General and Regional Level of consciousness: awake and alert Pain management: pain level controlled Vital Signs Assessment: post-procedure vital signs reviewed and stable Respiratory status: spontaneous breathing, nonlabored ventilation, respiratory function stable and patient connected to nasal cannula oxygen Cardiovascular status: blood pressure returned to baseline and stable Postop Assessment: no apparent nausea or vomiting Anesthetic complications: no    Last Vitals:  Vitals:   07/18/19 1145 07/18/19 1200  BP: 133/71 133/74  Pulse: (!) 48 (!) 52  Resp: 16 14  Temp:  36.4 C  SpO2: 100% 100%    Last Pain:  Vitals:   07/18/19 1115  TempSrc:   PainSc: 0-No pain                 Namya Voges,W. EDMOND

## 2019-07-18 NOTE — Brief Op Note (Signed)
07/18/2019  9:41 AM  PATIENT:  Joshua Hudson  84 y.o. male  PRE-OPERATIVE DIAGNOSIS:  History of previous right shoulder reverse TSA with infection, s/p implant removal and placement of abx spacer  POST-OPERATIVE DIAGNOSIS:  same  PROCEDURE:  Procedure(s) with comments: REVERSE TOTAL SHOULDER ARTHROPLASTY and removal of antiobotic spacer (Right) - interscalene block Biomet Comprehensive system with augmented baseplate  SURGEON:  Surgeon(s) and Role:    Netta Cedars, MD - Primary  PHYSICIAN ASSISTANT:   ASSISTANTS: [place] Bruns, PA-C   ANESTHESIA:   regional and general  EBL:  150 mL   BLOOD ADMINISTERED:none  DRAINS: none   LOCAL MEDICATIONS USED:  MARCAINE     SPECIMEN:  Tissue from intramedullary canal, right shoulder   DISPOSITION OF SPECIMEN:  micro  COUNTS:  YES  TOURNIQUET:  * No tourniquets in log *  DICTATION: .Other Dictation: Dictation Number [id number]  PLAN OF CARE: Admit to inpatient   PATIENT DISPOSITION:  PACU - hemodynamically stable.   Delay start of Pharmacological VTE agent (>24hrs) due to surgical blood loss or risk of bleeding: no

## 2019-07-18 NOTE — Discharge Instructions (Signed)
Ice to the shoulder constantly.  Keep the incision covered and clean and dry for one week, then ok to get it wet in the shower. ° °Do exercise as instructed several times per day. ° °DO NOT reach behind your back or push up out of a chair with the operative arm. ° °Use a sling while you are up and around for comfort, may remove while seated.  Keep pillow propped behind the operative elbow. ° °Follow up with Dr Alexcis Bicking in two weeks in the office, call [phone] for appt °

## 2019-07-18 NOTE — Anesthesia Procedure Notes (Signed)
Anesthesia Regional Block: Interscalene brachial plexus block   Pre-Anesthetic Checklist: ,, timeout performed, Correct Patient, Correct Site, Correct Laterality, Correct Procedure, Correct Position, site marked, Risks and benefits discussed, pre-op evaluation,  At surgeon's request and post-op pain management  Laterality: Right  Prep: Maximum Sterile Barrier Precautions used, chloraprep       Needles:  Injection technique: Single-shot  Needle Type: Echogenic Stimulator Needle     Needle Length: 5cm  Needle Gauge: 22     Additional Needles:   Procedures:,,,, ultrasound used (permanent image in chart),,,,  Narrative:  Start time: 07/18/2019 7:06 AM End time: 07/18/2019 7:16 AM Injection made incrementally with aspirations every 5 mL. Anesthesiologist: Roderic Palau, MD  Additional Notes: 2% Lidocaine skin wheel.

## 2019-07-19 LAB — BASIC METABOLIC PANEL
Anion gap: 10 (ref 5–15)
BUN: 17 mg/dL (ref 8–23)
CO2: 26 mmol/L (ref 22–32)
Calcium: 8.9 mg/dL (ref 8.9–10.3)
Chloride: 105 mmol/L (ref 98–111)
Creatinine, Ser: 1.26 mg/dL — ABNORMAL HIGH (ref 0.61–1.24)
GFR calc Af Amer: >60 mL/min (ref >60)
GFR calc non Af Amer: 53 mL/min — ABNORMAL LOW (ref >60)
Glucose, Bld: 92 mg/dL (ref 70–99)
Potassium: 3.9 mmol/L (ref 3.5–5.1)
Sodium: 141 mmol/L (ref 135–145)

## 2019-07-19 LAB — HEMOGLOBIN AND HEMATOCRIT, BLOOD
HCT: 29.7 % — ABNORMAL LOW (ref 39.0–52.0)
Hemoglobin: 9.1 g/dL — ABNORMAL LOW (ref 13.0–17.0)

## 2019-07-19 NOTE — Plan of Care (Signed)
Plan of care reviewed and discussed with the patient. 

## 2019-07-19 NOTE — Progress Notes (Signed)
Subjective: 1 Day Post-Op Procedure(s) (LRB): REVERSE TOTAL SHOULDER ARTHROPLASTY and removal of antiobotic spacer (Right) Patient reports pain as mild.  Reports pain well controlled  Objective: Vital signs in last 24 hours: Temp:  [97.5 F (36.4 C)-98.1 F (36.7 C)] 98.1 F (36.7 C) (11/14 1032) Pulse Rate:  [47-63] 63 (11/14 1032) Resp:  [13-20] 15 (11/14 1032) BP: (98-142)/(58-76) 113/62 (11/14 1032) SpO2:  [98 %-100 %] 100 % (11/14 1032)  Intake/Output from previous day: 11/13 0701 - 11/14 0700 In: 2568.7 [P.O.:240; I.V.:1512.1; Blood:366.7; IV Piggyback:450] Out: 2800 [Urine:2650; Blood:150] Intake/Output this shift: Total I/O In: 240 [P.O.:240] Out: -   Recent Labs    07/18/19 0932 07/19/19 0306  HGB 6.8* 9.1*   Recent Labs    07/18/19 0932 07/19/19 0306  HCT 20.0* 29.7*   Recent Labs    07/18/19 0932 07/19/19 0306  NA 140 141  K 4.1 3.9  CL 105 105  CO2  --  26  BUN 12 17  CREATININE 0.90 1.26*  GLUCOSE 98 92  CALCIUM  --  8.9   No results for input(s): LABPT, INR in the last 72 hours.  Neurologically intact ABD soft Neurovascular intact Sensation intact distally Intact pulses distally Dorsiflexion/Plantar flexion intact Incision: dressing C/D/I and no drainage No cellulitis present Compartment soft no calf pain or sign of DVT   Assessment/Plan: 1 Day Post-Op Procedure(s) (LRB): REVERSE TOTAL SHOULDER ARTHROPLASTY and removal of antiobotic spacer (Right) Advance diet Up with therapy D/C IV fluids  D/C home Discussed follow up as outpt Discussed dressing change and d/c instructions   Cecilie Kicks 07/19/2019, 10:39 AM

## 2019-07-19 NOTE — Plan of Care (Signed)
Ready for DC home 

## 2019-07-19 NOTE — Discharge Summary (Signed)
Patient ID: Joshua Hudson MRN: [id number] DOB/AGE: 1935-01-22 84 y.o.  Admit date: 07/18/2019 Discharge date: 07/19/2019  Admission Diagnoses:  Active Problems:   S/P shoulder replacement, right   Discharge Diagnoses:  Same  Past Medical History:  Diagnosis Date  . Aortic insufficiency 03/24/2019   AI of bioprosthetic AVR  . Chronic diastolic CHF (congestive heart failure) ([place]) 03/24/2019  . Colon polyps    s/p diverticular perforation requiring 2-stage repair  . Derangement of right shoulder joint    need replacing has no use of  . Diverticulosis   . DJD (degenerative joint disease), lumbosacral   . Dysphagia    eats soft food  . Esophageal stricture   . GERD (gastroesophageal reflux disease)   . Hemolytic anemia ([place])   . History of blood transfusion given with 04-15-19 surgery  . History of kidney stones    passed stones  . Hypertension   . Mitral regurgitation    moderate  . Occipital neuralgia   . Postoperative atrial fibrillation ([place]) 05/10/2015  . Prosthetic valve dysfunction   . Rheumatoid arthritis ([place])    s/o long term steroids  shoulders and hands  . Rotator cuff arthropathy    right  . S/P aortic valve replacement with bioprosthetic valve 01/16/2008   44mm Edwards Magna perimount bovine pericardial tissue valve, model 3000  . S/P valve-in-valve TAVR 04/15/2019   26 mm Edwards Sapien 3 Ultra transcatheter heart valve placed via percutaneous right transfemoral approach   . SBE (subacute bacterial endocarditis) prophylaxis candidate    for dental procedures  . Severe aortic stenosis    S/P prosthetic valve replacement w 25 mm Edwards like science percardial tissue valve,[place] - 01/2008    Surgeries: Procedure(s): REVERSE TOTAL SHOULDER ARTHROPLASTY and removal of antiobotic spacer on 07/18/2019   Consultants:   Discharged Condition: Improved  Hospital Course: Joshua Hudson is an 84 y.o. male who was admitted 07/18/2019 for operative treatment  of<principal problem not specified>. Patient has severe unremitting pain that affects sleep, daily activities, and work/hobbies. After pre-op clearance the patient was taken to the operating room on 07/18/2019 and underwent  Procedure(s): REVERSE TOTAL SHOULDER ARTHROPLASTY and removal of antiobotic spacer.    Patient was given perioperative antibiotics:  Anti-infectives (From admission, onward)   Start     Dose/Rate Route Frequency Ordered Stop   07/18/19 1400  ceFAZolin (ANCEF) IVPB 2g/100 mL premix     2 g 200 mL/hr over 30 Minutes Intravenous Every 6 hours 07/18/19 1156 07/19/19 0216   07/18/19 0600  ceFAZolin (ANCEF) IVPB 2g/100 mL premix     2 g 200 mL/hr over 30 Minutes Intravenous On call to O.R. 07/18/19 0539 07/18/19 0758       Patient was given sequential compression devices, early ambulation, and chemoprophylaxis to prevent DVT.  Patient benefited maximally from hospital stay and there were no complications.    Recent vital signs:  Patient Vitals for the past 24 hrs:  BP Temp Temp src Pulse Resp SpO2  07/19/19 1032 113/62 98.1 F (36.7 C) - 63 15 100 %  07/19/19 0544 120/68 97.6 F (36.4 C) - 63 16 100 %  07/19/19 0157 124/69 97.8 F (36.6 C) - 62 16 100 %  07/18/19 2012 (!) 102/58 98.1 F (36.7 C) - (!) 58 16 100 %  07/18/19 2007 - - - (!) 55 16 -  07/18/19 1853 (!) 98/59 97.8 F (36.6 C) Oral 60 18 98 %  07/18/19 1454 118/75 (!) 97.5 F (  36.4 C) Oral (!) 51 16 100 %  07/18/19 1405 120/76 (!) 97.5 F (36.4 C) - (!) 47 14 100 %  07/18/19 1301 131/74 - - (!) 47 15 100 %  07/18/19 1228 (!) 142/69 97.6 F (36.4 C) Oral (!) 51 14 100 %  07/18/19 1200 133/74 97.6 F (36.4 C) - (!) 52 14 100 %  07/18/19 1145 133/71 - - (!) 48 16 100 %  07/18/19 1130 138/76 - - (!) 55 18 100 %  07/18/19 1115 128/72 - - (!) 51 15 100 %  07/18/19 1100 123/75 - - (!) 48 13 100 %  07/18/19 1045 127/69 - - (!) 53 20 100 %     Recent laboratory studies:  Recent Labs    07/18/19 0932  07/19/19 0306  HGB 6.8* 9.1*  HCT 20.0* 29.7*  NA 140 141  K 4.1 3.9  CL 105 105  CO2  --  26  BUN 12 17  CREATININE 0.90 1.26*  GLUCOSE 98 92  CALCIUM  --  8.9     Discharge Medications:   Allergies as of 07/19/2019   No Known Allergies     Medication List    TAKE these medications   acetaminophen 500 MG tablet Commonly known as: TYLENOL Take 1,000 mg by mouth every 6 (six) hours as needed for moderate pain or headache.   ALPRAZolam 0.25 MG tablet Commonly known as: XANAX Take 1 tablet (0.25 mg total) by mouth at bedtime as needed for anxiety or sleep.   aspirin 81 MG EC tablet Take 1 tablet (81 mg total) by mouth daily.   cholecalciferol 25 MCG (1000 UT) tablet Commonly known as: VITAMIN D3 Take 1,000 Units by mouth daily.   clopidogrel 75 MG tablet Commonly known as: PLAVIX Take 1 tablet (75 mg total) by mouth daily with breakfast.   feeding supplement (ENSURE ENLIVE) Liqd Take 237 mLs by mouth 3 (three) times daily between meals.   finasteride 5 MG tablet Commonly known as: PROSCAR Take 5 mg by mouth daily.   folic acid 1 MG tablet Commonly known as: FOLVITE Take 1 mg by mouth daily.   furosemide 40 MG tablet Commonly known as: LASIX Take 40 mg daily. What changed:   how much to take  how to take this  when to take this  additional instructions   HYDROcodone-acetaminophen 5-325 MG tablet Commonly known as: Norco Take 1 tablet by mouth every 6 (six) hours as needed for moderate pain or severe pain.   MEGARED OMEGA-3 KRILL OIL PO Take 750 mg by mouth daily.   Melatonin 3 MG Tabs Take 3 mg by mouth at bedtime as needed (sleep).   metoprolol succinate 25 MG 24 hr tablet Commonly known as: TOPROL-XL Take 1 tablet by mouth once daily   multivitamin with minerals Tabs tablet Take 1 tablet by mouth daily.   pantoprazole 40 MG tablet Commonly known as: PROTONIX Take 40 mg by mouth daily before breakfast.   potassium chloride SA 20 MEQ  tablet Commonly known as: KLOR-CON Take 20 meq daily What changed:   how much to take  how to take this  when to take this  additional instructions   predniSONE 5 MG tablet Commonly known as: DELTASONE Take 5 mg by mouth daily with breakfast.   Theratears 0.25 % Soln Generic drug: Carboxymethylcellulose Sodium Place 1 drop into both eyes 3 (three) times daily as needed (dry/irritated eyes.).   Turmeric 500 MG Caps Take 500 mg by  mouth daily.   vitamin B-12 1000 MCG tablet Commonly known as: CYANOCOBALAMIN Take 1,000 mcg by mouth daily.       Diagnostic Studies: Dg Shoulder Right Port  Result Date: 07/18/2019 CLINICAL DATA:  Right total shoulder arthroplasty EXAM: PORTABLE RIGHT SHOULDER COMPARISON:  12/31/2018 right shoulder radiographs FINDINGS: Interval right total shoulder arthroplasty with removal of previous proximal right humeral hardware. Right glenoid and proximal right humeral prostheses appear well positioned with no evidence of malalignment on this single frontal view. Expected soft tissue gas surrounding the right clinic humeral joint. Skin staples overlie proximal right humerus. No osseous fracture or suspicious focal osseous lesions. Intact appearing visualized sternotomy wires. IMPRESSION: Satisfactory immediate single frontal view postoperative appearance status post right total shoulder arthroplasty. Electronically Signed   By: Ilona Sorrel M.D.   On: 07/18/2019 10:35    Disposition: Discharge disposition: 01-Home or Self Care       Discharge Instructions    Call MD / Call 911   Complete by: As directed    If you experience chest pain or shortness of breath, CALL 911 and be transported to the hospital emergency room.  If you develope a fever above 101 F, pus (white drainage) or increased drainage or redness at the wound, or calf pain, call your surgeon's office.   Constipation Prevention   Complete by: As directed    Drink plenty of fluids.  Prune  juice may be helpful.  You may use a stool softener, such as Colace (over the counter) 100 mg twice a day.  Use MiraLax (over the counter) for constipation as needed.   Diet - low sodium heart healthy   Complete by: As directed    Increase activity slowly as tolerated   Complete by: As directed       Follow-up Information    Netta Cedars, MD. Call in 2 weeks.   Specialty: Orthopedic Surgery Why: [phone] Contact information: [address] [place] [address] [id number]            Signed: Cecilie Kicks 07/19/2019, 10:42 AM

## 2019-07-19 NOTE — Evaluation (Signed)
Occupational Therapy Evaluation Patient Details Name: Joshua Hudson MRN: [id number] DOB: [birth year] Today's Date: 07/19/2019    History of Present Illness Pt is 84 y/o male s/p REVERSE TOTAL SHOULDER ARTHROPLASTY and removal of antiobotic spacer (Right). PMH includes HTN and RA.   Clinical Impression   PTA pt living with wife and independent for BADL. At time of eval, pt is completing functional mobility at mod I and BADL with set up to supervision level of assist. Shoulder instructions reviewed in regard to exercise, safety, and implication in BADL activity as detailed below. Pt reports this is a repeat surgery for him and he is aware of the process and very eager to go home. Dtr present for education and understanding of exercises/education provided. Pt reports pain as no worse than before sx and is using RUE functional for BADLs within precautions. Recommended pt to follow up with MD as shoulder progresses. Anticipate d/c this date.     Follow Up Recommendations  Follow surgeon's recommendation for DC plan and follow-up therapies    Equipment Recommendations  None recommended by OT    Recommendations for Other Services       Precautions / Restrictions Precautions Precautions: Shoulder Type of Shoulder Precautions: Sling at all times; NWB; AROM okay at hand, wrist, elbow; No AROM/PROM at shoulder Shoulder Interventions: Shoulder sling/immobilizer Precaution Booklet Issued: Yes (comment) Precaution Comments: reviewed with pt and dtr in context of BADLs, left handout Restrictions Weight Bearing Restrictions: Yes RUE Weight Bearing: Non weight bearing      Mobility Bed Mobility Overal bed mobility: Modified Independent                Transfers Overall transfer level: Modified independent                    Balance Overall balance assessment: No apparent balance deficits (not formally assessed)                                         ADL  either performed or assessed with clinical judgement   ADL Overall ADL's : Needs assistance/impaired Eating/Feeding: Set up;Sitting   Grooming: Set up;Sitting       Lower Body Bathing: Set up;Sit to/from stand   Upper Body Dressing : Set up;Sitting Upper Body Dressing Details (indicate cue type and reason): educated on compensatory dressing technique, states he has been doing it "for years" Lower Body Dressing: Set up;Sitting/lateral leans;Sit to/from stand   Toilet Transfer: Sales executive;Ambulation   Toileting- Clothing Manipulation and Hygiene: Set up;Sitting/lateral lean;Sit to/from stand   Tub/ Shower Transfer: Supervision/safety;Ambulation   Functional mobility during ADLs: Supervision/safety General ADL Comments: pt overall at supervision to set up level for BADL. Very motivated to be independent. Showing appropriate use of RUE for BADL within precautions     Vision Patient Visual Report: No change from baseline       Perception     Praxis      Pertinent Vitals/Pain Pain Assessment: No/denies pain     Hand Dominance     Extremity/Trunk Assessment Upper Extremity Assessment Upper Extremity Assessment: RUE deficits/detail;Overall [facility] for tasks assessed RUE Deficits / Details: denies pain, says no worse than baseline. Appropriately in sling, awaiting d/c dressing           Communication Communication Communication: No difficulties   Cognition Arousal/Alertness: Awake/alert Behavior During Therapy: WFL for tasks assessed/performed Overall  Cognitive Status: Within Functional Limits for tasks assessed                                     General Comments       Exercises Shoulder Exercises Elbow Flexion: 5 reps;Seated;Right Elbow Extension: Right;5 reps;Seated Wrist Flexion: Right;5 reps;Seated Wrist Extension: Right;5 reps;Seated Digit Composite Flexion: Right;5 reps;Seated Composite Extension: Right;5 reps;Seated    Shoulder Instructions Shoulder Instructions Donning/doffing shirt without moving shoulder: Set-up Method for sponge bathing under operated UE: Supervision/safety Donning/doffing sling/immobilizer: Supervision/safety Correct positioning of sling/immobilizer: Supervision/safety ROM for elbow, wrist and digits of operated UE: Supervision/safety Sling wearing schedule (on at all times/off for ADL's): Supervision/safety Proper positioning of operated UE when showering: Supervision/safety Positioning of UE while sleeping: [place] expects to be discharged to:: Private residence Living Arrangements: Spouse/significant other Available Help at Discharge: Family Type of Home: House Home Access: Stairs to enter Technical brewer of Steps: 3   Home Layout: One level     Bathroom Shower/Tub: Teacher, early years/pre: Standard     Home Equipment: Environmental consultant - 2 wheels          Prior Functioning/Environment Level of Independence: Independent                 OT Problem List: Decreased strength;Decreased knowledge of use of DME or AE;Decreased range of motion;Decreased knowledge of precautions;Impaired UE functional use      OT Treatment/Interventions:      OT Goals(Current goals can be found in the care plan section) Acute Rehab OT Goals Patient Stated Goal: to go home ASAP OT Goal Formulation: With patient Time For Goal Achievement: 08/02/19 Potential to Achieve Goals: Good  OT Frequency:     Barriers to D/C:            Co-evaluation              AM-PAC OT "6 Clicks" Daily Activity     Outcome Measure Help from another person eating meals?: A Little Help from another person taking care of personal grooming?: A Little Help from another person toileting, which includes using toliet, bedpan, or urinal?: A Little Help from another person bathing (including washing, rinsing, drying)?: A Little Help from another  person to put on and taking off regular upper body clothing?: A Little Help from another person to put on and taking off regular lower body clothing?: A Little 6 Click Score: 18   End of Session Nurse Communication: Mobility status  Activity Tolerance: Patient tolerated treatment well Patient left: in chair;with call bell/phone within reach;with family/visitor present  OT Visit Diagnosis: Other abnormalities of gait and mobility (R26.89)                Time: [id number] OT Time Calculation (min): 11 min Charges:  OT General Charges $OT Visit: 1 Visit OT Evaluation $OT Eval Low Complexity: 1 Low  Zenovia Jarred, MSOT, OTR/L Behavioral Health OT/ Acute Relief OT [facility] Office: [phone]  Zenovia Jarred 07/19/2019, 11:32 AM

## 2019-07-22 ENCOUNTER — Telehealth: Payer: Self-pay | Admitting: Cardiology

## 2019-07-22 ENCOUNTER — Encounter (HOSPITAL_COMMUNITY): Payer: Self-pay | Admitting: Orthopedic Surgery

## 2019-07-22 LAB — TYPE AND SCREEN
ABO/RH(D): A POS
Antibody Screen: NEGATIVE
Unit division: 0
Unit division: 0

## 2019-07-22 LAB — BPAM RBC
Blood Product Expiration Date: 202012102359
Blood Product Expiration Date: 202012102359
ISSUE DATE / TIME: 202011131001
Unit Type and Rh: 6200
Unit Type and Rh: 6200

## 2019-07-22 NOTE — Telephone Encounter (Signed)
   Primary Cardiologist: [name], MD  Chart reviewed as part of pre-operative protocol coverage. Simple dental extractions are considered low risk procedures per guidelines and generally do not require any specific cardiac clearance. It is also generally accepted that for simple extractions and dental cleanings, crown replacement,  there is no need to interrupt blood thinner therapy.   SBE prophylaxis is required for the patient. He has had a TAVR in 04/2019.  Amoxicillin 2 gms po one hour before the procedure is called into [facility] [phone] in [place]. [place]. Spoke with the patient to inform him.  Faxed the note to [facility] [phone].   I will route this recommendation to the requesting party via Epic fax function and remove from pre-op pool.  Please call with questions.  Joshua Sims, NP 07/22/2019, 10:01 AM

## 2019-07-22 NOTE — Telephone Encounter (Signed)
New Message  1. What dental office are you calling from? [facility]  2. What is your office phone number? [phone]    3. What is your fax number? [phone]  4. What type of procedure is the patient having performed? crown   5. What date is procedure scheduled or is the patient there now? Today at 4 pm for evaluation; procedure done on 07/23/19  (if the patient is at the dentist's office question goes to their cardiologist if he/she is in the office.  If not, question should go to the DOD).   6. What is your question (ex. Antibiotics prior to procedure, holding medication-we need to know how long dentist wants pt to hold med)? Wants to know if any blood thinners should be withheld.

## 2019-07-23 LAB — AEROBIC/ANAEROBIC CULTURE W GRAM STAIN (SURGICAL/DEEP WOUND): Culture: NO GROWTH

## 2019-07-26 ENCOUNTER — Encounter: Payer: Self-pay | Admitting: Hematology and Oncology

## 2019-08-05 ENCOUNTER — Encounter (HOSPITAL_COMMUNITY): Payer: Self-pay

## 2019-08-05 ENCOUNTER — Other Ambulatory Visit (HOSPITAL_COMMUNITY)
Admission: RE | Admit: 2019-08-05 | Discharge: 2019-08-05 | Disposition: A | Discharge status: Home / Self Care | Payer: Medicare Other | Source: Ambulatory Visit | Attending: Orthopedic Surgery | Admitting: Orthopedic Surgery

## 2019-08-05 ENCOUNTER — Other Ambulatory Visit: Payer: Self-pay

## 2019-08-05 DIAGNOSIS — Z20828 Contact with and (suspected) exposure to other viral communicable diseases: Secondary | ICD-10-CM | POA: Insufficient documentation

## 2019-08-05 DIAGNOSIS — Z01812 Encounter for preprocedural laboratory examination: Secondary | ICD-10-CM | POA: Insufficient documentation

## 2019-08-05 LAB — SARS CORONAVIRUS 2 (TAT 6-24 HRS): SARS Coronavirus 2: NEGATIVE

## 2019-08-05 NOTE — H&P (Signed)
MANUEL BLEGEN is an 84 y.o. male.   Chief Complaint: right shoulder instability HPI: 84 yo male s/p revision right TSA 2 weeks ago who presented to his first post op appt with a shoulder dislocation.  The patient denies trauma to the shoulder and does not know exactly when this occurred.  Past Medical History:  Diagnosis Date  . Achalasia 06/12/2019   Noted on EGD  . Aortic insufficiency 03/24/2019   AI of bioprosthetic AVR severe 4 plus  . Chronic diastolic CHF (congestive heart failure) ([place]) 03/24/2019  . Colon polyps    s/p diverticular perforation requiring 2-stage repair  . Derangement of right shoulder joint    need replacing has no use of  . Diverticulosis   . DJD (degenerative joint disease), lumbosacral   . Dysphagia    eats soft food  . Esophageal stricture   . GERD (gastroesophageal reflux disease)   . Hemolytic anemia ([place])   . History of blood transfusion given with 04-15-19 surgery  . History of blood transfusion 07/18/2019  . History of kidney stones    passed stones  . Hypertension   . Mitral regurgitation    moderate  . Occipital neuralgia   . Pancytopenia ([place])   . Postoperative atrial fibrillation ([place]) 05/10/2015  . Prosthetic valve dysfunction   . Rheumatoid arthritis ([place])    s/o long term steroids  shoulders and hands  . Rotator cuff arthropathy    right  . S/P aortic valve replacement with bioprosthetic valve 01/16/2008   84mm Edwards Magna perimount bovine pericardial tissue valve, model 3000  . S/P valve-in-valve TAVR 04/15/2019   26 mm Edwards Sapien 3 Ultra transcatheter heart valve placed via percutaneous right transfemoral approach   . SBE (subacute bacterial endocarditis) prophylaxis candidate    for dental procedures  . Schatzki's ring 06/12/2019   Narrowing, Noted on EGD  . Severe aortic stenosis    S/P prosthetic valve replacement w 25 mm Edwards like science percardial tissue valve,[place] - 01/2008  . Thrombocytopenia ([place])     Past  Surgical History:  Procedure Laterality Date  . APPENDECTOMY    . BALLOON DILATION N/A 06/12/2019   Procedure: BALLOON DILATION;  Surgeon: Ronnette Juniper, MD;  Location: [facility];  Service: Gastroenterology;  Laterality: N/A;  . BOTOX INJECTION N/A 01/22/2018   Procedure: BOTOX INJECTION;  Surgeon: Ronnette Juniper, MD;  Location: [facility];  Service: Gastroenterology;  Laterality: N/A;  . CARDIAC CATHETERIZATION     09  . CARDIAC VALVE REPLACEMENT  01/2008   aortic valve replacement  . CATARACT EXTRACTION W/ INTRAOCULAR LENS  IMPLANT, BILATERAL    . COLON RESECTION     diverticulitis   . CORONARY ANGIOPLASTY    . dental implants     permanent  . ESOPHAGEAL MANOMETRY N/A 11/07/2017   Procedure: ESOPHAGEAL MANOMETRY (EM);  Surgeon: Ronnette Juniper, MD;  Location: [facility];  Service: Gastroenterology;  Laterality: N/A;  . ESOPHAGOGASTRODUODENOSCOPY (EGD) WITH PROPOFOL N/A 01/22/2018   Procedure: ESOPHAGOGASTRODUODENOSCOPY (EGD) WITH PROPOFOL;  Surgeon: Ronnette Juniper, MD;  Location: [facility];  Service: Gastroenterology;  Laterality: N/A;  . ESOPHAGOGASTRODUODENOSCOPY (EGD) WITH PROPOFOL N/A 04/30/2019   Procedure: ESOPHAGOGASTRODUODENOSCOPY (EGD) WITH PROPOFOL;  Surgeon: Laurence Spates, MD;  Location: [place];  Service: Endoscopy;  Laterality: N/A;  . ESOPHAGOGASTRODUODENOSCOPY (EGD) WITH PROPOFOL N/A 06/12/2019   Procedure: ESOPHAGOGASTRODUODENOSCOPY (EGD) WITH PROPOFOL;  Surgeon: Ronnette Juniper, MD;  Location: [facility];  Service: Gastroenterology;  Laterality: N/A;  with botox injection  . EXCISIONAL TOTAL  SHOULDER ARTHROPLASTY WITH ANTIBIOTIC SPACER Right 12/31/2018   Procedure: EXCISIONAL TOTAL SHOULDER ARTHROPLASTY WITH ANTIBIOTIC SPACER;  Surgeon: Netta Cedars, MD;  Location: [place];  Service: Orthopedics;  Laterality: Right;  . HERNIA REPAIR    . INTRAOPERATIVE TRANSTHORACIC ECHOCARDIOGRAM N/A 04/15/2019   Procedure: Intraoperative Transthoracic Echocardiogram;  Surgeon: Sherren Mocha, MD;  Location: [place];  Service: Open Heart Surgery;  Laterality: N/A;  . IRRIGATION AND DEBRIDEMENT SHOULDER Right 11/20/2018    IRRIGATION AND DEBRIDEMENT SHOULDER WITH POLY EXCHANGE (Right Shoulder)  . IRRIGATION AND DEBRIDEMENT SHOULDER Right 11/20/2018   Procedure: IRRIGATION AND DEBRIDEMENT SHOULDER WITH POLY EXCHANGE;  Surgeon: Netta Cedars, MD;  Location: [place];  Service: Orthopedics;  Laterality: Right;  . LUMBAR LAMINECTOMY     x 2  . REVERSE SHOULDER ARTHROPLASTY Right 03/01/2018   Procedure: RIGHT REVERSE SHOULDER ARTHROPLASTY;  Surgeon: Netta Cedars, MD;  Location: [place];  Service: Orthopedics;  Laterality: Right;  . REVERSE SHOULDER ARTHROPLASTY Right 07/18/2019   Procedure: REVERSE TOTAL SHOULDER ARTHROPLASTY and removal of antiobotic spacer;  Surgeon: Netta Cedars, MD;  Location: [facility];  Service: Orthopedics;  Laterality: Right;  interscalene block  . RIGHT/LEFT HEART CATH AND CORONARY ANGIOGRAPHY N/A 04/02/2019   Procedure: RIGHT/LEFT HEART CATH AND CORONARY ANGIOGRAPHY;  Surgeon: Leonie Man, MD;  Location: [facility];  Service: Cardiovascular;  Laterality: N/A;  . SAVORY DILATION N/A 01/22/2018   Procedure: SAVORY DILATION;  Surgeon: Ronnette Juniper, MD;  Location: [facility];  Service: Gastroenterology;  Laterality: N/A;  . SAVORY DILATION N/A 04/30/2019   Procedure: SAVORY DILATION;  Surgeon: Laurence Spates, MD;  Location: [place];  Service: Endoscopy;  Laterality: N/A;  With fluro  . SHOULDER HEMI-ARTHROPLASTY Right 06/14/2018   Procedure: RIGHT  REVERSE TOTAL SHOULDER OPEN POLY EXCHANGE;  Surgeon: Netta Cedars, MD;  Location: [place];  Service: Orthopedics;  Laterality: Right;  . SUBMUCOSAL INJECTION  06/12/2019   Procedure: SUBMUCOSAL INJECTION;  Surgeon: Ronnette Juniper, MD;  Location: [facility];  Service: Gastroenterology;;  . TEE WITHOUT CARDIOVERSION N/A 01/29/2014   Procedure: TRANSESOPHAGEAL ECHOCARDIOGRAM (TEE);  Surgeon: Sueanne Margarita, MD;   Location: [facility];  Service: Cardiovascular;  Laterality: N/A;  . TEE WITHOUT CARDIOVERSION N/A 04/02/2019   Procedure: TRANSESOPHAGEAL ECHOCARDIOGRAM (TEE);  Surgeon: Josue Hector, MD;  Location: [facility];  Service: Cardiovascular;  Laterality: N/A;  . TONSILLECTOMY    . TRANSCATHETER AORTIC VALVE REPLACEMENT, TRANSFEMORAL  04/15/2019  . TRANSCATHETER AORTIC VALVE REPLACEMENT, TRANSFEMORAL N/A 04/15/2019   Procedure: TRANSCATHETER AORTIC VALVE REPLACEMENT, TRANSFEMORAL with POST BALLOON DILATION;  Surgeon: Sherren Mocha, MD;  Location: [place];  Service: Open Heart Surgery;  Laterality: N/A;    Family History  Problem Relation Age of Onset  . Other Mother        NO HEALTH PROBLEMS  . Heart disease Father   . Arthritis Father   . Heart Problems Sister        RELATED TO A MVA  . Suicidality Brother   . Other Sister        [place]  . Other Brother        2 [place]  . Other Daughter        2 IN GOOD HEALTH   Social History:  reports that he quit smoking about 35 years ago. His smoking use included cigarettes. He started smoking about 45 years ago. He has a 5.00 pack-year smoking history. He has quit using smokeless tobacco.  His smokeless tobacco use included chew. He reports previous alcohol use. He reports that he does not use drugs.  Allergies: No Known Allergies  No medications prior to admission.    No results found for this or any previous visit (from the past 48 hour(s)). No results found.  ROS  Height 5\' 2"  (1.575 m), weight 46.7 kg. Physical Exam  Right shoulder with healing incision and no evidence of infection. He has obvious loss of ROM and deformity consistent with dislocated reverse TSA. Minimal distal swelling  Neuro stable.   Assessment/Plan Right shoulder dislocated Reverse TSA. Plan open reduction and poly exchange Patient agrees with this plan  [name], MD 08/05/2019, 1:48 PM

## 2019-08-05 NOTE — Progress Notes (Signed)
PCP - Dr. Electa Sniff Cardiologist - Dr. [name] Last office visit 06/24/2019, Cardiac clearance given for previous surgery on 07/18/2019 in epic  Chest x-ray - 04/28/2019 in epic EKG - 04/29/2019 in epic Stress Test - N/A ECHO - 05/14/2019 in epic Cardiac Cath - 04/02/2019 in epic  Sleep Study - N/A CPAP - N/A  Fasting Blood Sugar - N/A Checks Blood Sugar __N/A___ times a day  Blood Thinner Instructions: Plavix last dose 08/05/2019 per Peggy pts daughter he did not stop Plavix for previous surgery Aspirin Instructions: Last dose 08/05/2019 per Peggy pts daughter he did not stop Plavix for previous surgery  Anesthesia review: S/P TAVR, CHF, Post op Afib  Patient denies shortness of breath, fever, cough and chest pain at PAT appointment   Patient verbalized understanding of instructions that were given to them at the PAT appointment. Patient was also instructed that they will need to review over the PAT instructions again at home before surgery.

## 2019-08-06 ENCOUNTER — Encounter (HOSPITAL_COMMUNITY)
Admission: RE | Disposition: A | Discharge status: Home / Self Care | Payer: Self-pay | Source: Home / Self Care | Attending: Orthopedic Surgery

## 2019-08-06 ENCOUNTER — Other Ambulatory Visit: Payer: Self-pay

## 2019-08-06 ENCOUNTER — Ambulatory Visit (HOSPITAL_COMMUNITY)
Admission: RE | Admit: 2019-08-06 | Discharge: 2019-08-06 | Disposition: A | Discharge status: Home / Self Care | Payer: Medicare Other | Attending: Orthopedic Surgery | Admitting: Orthopedic Surgery

## 2019-08-06 ENCOUNTER — Encounter (HOSPITAL_COMMUNITY): Payer: Self-pay | Admitting: *Deleted

## 2019-08-06 ENCOUNTER — Inpatient Hospital Stay (HOSPITAL_COMMUNITY): Payer: Medicare Other | Admitting: Certified Registered"

## 2019-08-06 ENCOUNTER — Other Ambulatory Visit: Payer: Self-pay | Admitting: Cardiology

## 2019-08-06 DIAGNOSIS — Z87891 Personal history of nicotine dependence: Secondary | ICD-10-CM | POA: Insufficient documentation

## 2019-08-06 DIAGNOSIS — T84028A Dislocation of other internal joint prosthesis, initial encounter: Secondary | ICD-10-CM | POA: Insufficient documentation

## 2019-08-06 DIAGNOSIS — I4891 Unspecified atrial fibrillation: Secondary | ICD-10-CM | POA: Insufficient documentation

## 2019-08-06 DIAGNOSIS — X58XXXA Exposure to other specified factors, initial encounter: Secondary | ICD-10-CM | POA: Diagnosis not present

## 2019-08-06 DIAGNOSIS — Z7952 Long term (current) use of systemic steroids: Secondary | ICD-10-CM | POA: Insufficient documentation

## 2019-08-06 DIAGNOSIS — I110 Hypertensive heart disease with heart failure: Secondary | ICD-10-CM | POA: Diagnosis not present

## 2019-08-06 DIAGNOSIS — Z96611 Presence of right artificial shoulder joint: Secondary | ICD-10-CM

## 2019-08-06 DIAGNOSIS — Z953 Presence of xenogenic heart valve: Secondary | ICD-10-CM | POA: Insufficient documentation

## 2019-08-06 DIAGNOSIS — M069 Rheumatoid arthritis, unspecified: Secondary | ICD-10-CM | POA: Insufficient documentation

## 2019-08-06 DIAGNOSIS — I5032 Chronic diastolic (congestive) heart failure: Secondary | ICD-10-CM | POA: Insufficient documentation

## 2019-08-06 HISTORY — DX: Thrombocytopenia, unspecified: D69.6

## 2019-08-06 HISTORY — PX: REVERSE SHOULDER ARTHROPLASTY: SHX5054

## 2019-08-06 HISTORY — DX: Other pancytopenia: D61.818

## 2019-08-06 LAB — BASIC METABOLIC PANEL
Anion gap: 11 (ref 5–15)
BUN: 20 mg/dL (ref 8–23)
CO2: 25 mmol/L (ref 22–32)
Calcium: 8.9 mg/dL (ref 8.9–10.3)
Chloride: 105 mmol/L (ref 98–111)
Creatinine, Ser: 1.09 mg/dL (ref 0.61–1.24)
GFR calc Af Amer: >60 mL/min (ref >60)
GFR calc non Af Amer: >60 mL/min (ref >60)
Glucose, Bld: 93 mg/dL (ref 70–99)
Potassium: 4.8 mmol/L (ref 3.5–5.1)
Sodium: 141 mmol/L (ref 135–145)

## 2019-08-06 LAB — CBC
HCT: 28.5 % — ABNORMAL LOW (ref 39.0–52.0)
Hemoglobin: 8.6 g/dL — ABNORMAL LOW (ref 13.0–17.0)
MCH: 23.2 pg — ABNORMAL LOW (ref 26.0–34.0)
MCHC: 30.2 g/dL (ref 30.0–36.0)
MCV: 77 fL — ABNORMAL LOW (ref 80.0–100.0)
Platelets: 219 10*3/uL (ref 150–400)
RBC: 3.7 MIL/uL — ABNORMAL LOW (ref 4.22–5.81)
RDW: 19.6 % — ABNORMAL HIGH (ref 11.5–15.5)
WBC: 6.7 10*3/uL (ref 4.0–10.5)
nRBC: 0.5 % — ABNORMAL HIGH (ref 0.0–0.2)

## 2019-08-06 LAB — TYPE AND SCREEN
ABO/RH(D): A POS
Antibody Screen: NEGATIVE

## 2019-08-06 SURGERY — ARTHROPLASTY, SHOULDER, TOTAL, REVERSE
Anesthesia: General | Site: Shoulder | Laterality: Right

## 2019-08-06 MED ORDER — SUCCINYLCHOLINE CHLORIDE 200 MG/10ML IV SOSY
PREFILLED_SYRINGE | INTRAVENOUS | Status: DC | PRN
  Administered 2019-08-06: 100 mg via INTRAVENOUS

## 2019-08-06 MED ORDER — LACTATED RINGERS IV SOLN
INTRAVENOUS | Status: DC
  Administered 2019-08-06 (×2): via INTRAVENOUS

## 2019-08-06 MED ORDER — PHENYLEPHRINE HCL-NACL 10-0.9 MG/250ML-% IV SOLN
INTRAVENOUS | Status: DC | PRN
  Administered 2019-08-06: 40 ug/min via INTRAVENOUS

## 2019-08-06 MED ORDER — FENTANYL CITRATE (PF) 100 MCG/2ML IJ SOLN
INTRAMUSCULAR | Status: AC
  Filled 2019-08-06: qty 2

## 2019-08-06 MED ORDER — CHLORHEXIDINE GLUCONATE 4 % EX LIQD: 60.0000 mL | Freq: Once | CUTANEOUS | Status: DC

## 2019-08-06 MED ORDER — PROPOFOL 10 MG/ML IV BOLUS
INTRAVENOUS | Status: AC
  Filled 2019-08-06: qty 20

## 2019-08-06 MED ORDER — BUPIVACAINE-EPINEPHRINE (PF) 0.5% -1:200000 IJ SOLN
INTRAMUSCULAR | Status: DC | PRN
  Administered 2019-08-06 (×3): 5 mL via PERINEURAL

## 2019-08-06 MED ORDER — PROMETHAZINE HCL 25 MG/ML IJ SOLN: 6.2500 mg | INTRAMUSCULAR | Status: DC | PRN

## 2019-08-06 MED ORDER — LIDOCAINE 2% (20 MG/ML) 5 ML SYRINGE
INTRAMUSCULAR | Status: DC | PRN
  Administered 2019-08-06: 60 mg via INTRAVENOUS

## 2019-08-06 MED ORDER — PHENYLEPHRINE HCL (PRESSORS) 10 MG/ML IV SOLN
INTRAVENOUS | Status: AC
  Filled 2019-08-06: qty 1

## 2019-08-06 MED ORDER — SODIUM CHLORIDE 0.9 % IR SOLN
Status: DC | PRN
  Administered 2019-08-06: 1

## 2019-08-06 MED ORDER — FENTANYL CITRATE (PF) 100 MCG/2ML IJ SOLN: 25.0000 ug | INTRAMUSCULAR | Status: DC | PRN

## 2019-08-06 MED ORDER — PHENYLEPHRINE 40 MCG/ML (10ML) SYRINGE FOR IV PUSH (FOR BLOOD PRESSURE SUPPORT)
PREFILLED_SYRINGE | INTRAVENOUS | Status: AC
  Filled 2019-08-06: qty 10

## 2019-08-06 MED ORDER — CLONIDINE HCL (ANALGESIA) 100 MCG/ML EP SOLN
EPIDURAL | Status: DC | PRN
  Administered 2019-08-06: 50 ug

## 2019-08-06 MED ORDER — BUPIVACAINE-EPINEPHRINE 0.25% -1:200000 IJ SOLN
INTRAMUSCULAR | Status: AC
  Filled 2019-08-06: qty 1

## 2019-08-06 MED ORDER — BUPIVACAINE-EPINEPHRINE (PF) 0.25% -1:200000 IJ SOLN
INTRAMUSCULAR | Status: DC | PRN
  Administered 2019-08-06: 18 mL via PERINEURAL

## 2019-08-06 MED ORDER — DEXAMETHASONE SODIUM PHOSPHATE 10 MG/ML IJ SOLN
INTRAMUSCULAR | Status: AC
  Filled 2019-08-06: qty 1

## 2019-08-06 MED ORDER — EPHEDRINE 5 MG/ML INJ
INTRAVENOUS | Status: AC
  Filled 2019-08-06: qty 10

## 2019-08-06 MED ORDER — CLOPIDOGREL BISULFATE 75 MG PO TABS: 75.0000 mg | ORAL_TABLET | Freq: Every day | ORAL | 3 refills | Status: DC

## 2019-08-06 MED ORDER — METOPROLOL TARTRATE 5 MG/5ML IV SOLN
INTRAVENOUS | Status: DC | PRN
  Administered 2019-08-06: 1.5 mg via INTRAVENOUS
  Administered 2019-08-06 (×2): 1 mg via INTRAVENOUS

## 2019-08-06 MED ORDER — FENTANYL CITRATE (PF) 100 MCG/2ML IJ SOLN
INTRAMUSCULAR | Status: DC | PRN
  Administered 2019-08-06 (×3): 50 ug via INTRAVENOUS

## 2019-08-06 MED ORDER — ONDANSETRON HCL 4 MG/2ML IJ SOLN
INTRAMUSCULAR | Status: AC
  Filled 2019-08-06: qty 2

## 2019-08-06 MED ORDER — ROCURONIUM BROMIDE 10 MG/ML (PF) SYRINGE
PREFILLED_SYRINGE | INTRAVENOUS | Status: AC
  Filled 2019-08-06: qty 10

## 2019-08-06 MED ORDER — PROPOFOL 10 MG/ML IV BOLUS
INTRAVENOUS | Status: DC | PRN
  Administered 2019-08-06: 100 mg via INTRAVENOUS

## 2019-08-06 MED ORDER — ACETAMINOPHEN 10 MG/ML IV SOLN: 1000.0000 mg | Freq: Once | INTRAVENOUS | Status: DC | PRN

## 2019-08-06 MED ORDER — ONDANSETRON HCL 4 MG/2ML IJ SOLN
INTRAMUSCULAR | Status: DC | PRN
  Administered 2019-08-06: 4 mg via INTRAVENOUS

## 2019-08-06 MED ORDER — CEFAZOLIN SODIUM-DEXTROSE 2-4 GM/100ML-% IV SOLN
2.0000 g | INTRAVENOUS | Status: AC
  Administered 2019-08-06: 2 g via INTRAVENOUS
  Filled 2019-08-06: qty 100

## 2019-08-06 MED ORDER — DEXAMETHASONE SODIUM PHOSPHATE 10 MG/ML IJ SOLN
INTRAMUSCULAR | Status: DC | PRN
  Administered 2019-08-06: 8 mg via INTRAVENOUS

## 2019-08-06 MED ORDER — PHENYLEPHRINE 40 MCG/ML (10ML) SYRINGE FOR IV PUSH (FOR BLOOD PRESSURE SUPPORT)
PREFILLED_SYRINGE | INTRAVENOUS | Status: DC | PRN
  Administered 2019-08-06: 120 ug via INTRAVENOUS
  Administered 2019-08-06 (×2): 80 ug via INTRAVENOUS
  Administered 2019-08-06: 120 ug via INTRAVENOUS
  Administered 2019-08-06: 80 ug via INTRAVENOUS

## 2019-08-06 MED ORDER — ROCURONIUM BROMIDE 10 MG/ML (PF) SYRINGE
PREFILLED_SYRINGE | INTRAVENOUS | Status: DC | PRN
  Administered 2019-08-06: 10 mg via INTRAVENOUS

## 2019-08-06 MED ORDER — ESMOLOL HCL 100 MG/10ML IV SOLN
INTRAVENOUS | Status: DC | PRN
  Administered 2019-08-06: 30 mg via INTRAVENOUS
  Administered 2019-08-06 (×2): 20 mg via INTRAVENOUS
  Administered 2019-08-06: 30 mg via INTRAVENOUS

## 2019-08-06 MED ORDER — PHENYLEPHRINE 40 MCG/ML (10ML) SYRINGE FOR IV PUSH (FOR BLOOD PRESSURE SUPPORT)
PREFILLED_SYRINGE | INTRAVENOUS | Status: AC
  Filled 2019-08-06: qty 20

## 2019-08-06 MED ORDER — METOPROLOL SUCCINATE ER 25 MG PO TB24: 25.0000 mg | ORAL_TABLET | Freq: Every day | ORAL | 3 refills | Status: DC

## 2019-08-06 MED ORDER — MEPERIDINE HCL 50 MG/ML IJ SOLN: 6.2500 mg | INTRAMUSCULAR | Status: DC | PRN

## 2019-08-06 MED ORDER — FENTANYL CITRATE (PF) 100 MCG/2ML IJ SOLN
50.0000 ug | INTRAMUSCULAR | Status: DC
  Administered 2019-08-06: 50 ug via INTRAVENOUS
  Filled 2019-08-06: qty 2

## 2019-08-06 MED ORDER — LIDOCAINE 2% (20 MG/ML) 5 ML SYRINGE
INTRAMUSCULAR | Status: AC
  Filled 2019-08-06: qty 5

## 2019-08-06 SURGICAL SUPPLY — 63 items
BAG ZIPLOCK 12X15 (MISCELLANEOUS) IMPLANT
BIT DRILL 1.6MX128 (BIT) IMPLANT
BIT DRILL 1.6MX128MM (BIT)
BLADE SAG 18X100X1.27 (BLADE) ×3 IMPLANT
CLOSURE WOUND 1/2 X4 (GAUZE/BANDAGES/DRESSINGS) ×1
COVER BACK TABLE 60X90IN (DRAPES) ×3 IMPLANT
COVER SURGICAL LIGHT HANDLE (MISCELLANEOUS) ×3 IMPLANT
COVER WAND RF STERILE (DRAPES) IMPLANT
DECANTER SPIKE VIAL GLASS SM (MISCELLANEOUS) ×3 IMPLANT
DRAPE INCISE IOBAN 66X45 STRL (DRAPES) ×3 IMPLANT
DRAPE ORTHO SPLIT 77X108 STRL (DRAPES) ×4
DRAPE SHEET LG 3/4 BI-LAMINATE (DRAPES) ×3 IMPLANT
DRAPE SURG ORHT 6 SPLT 77X108 (DRAPES) ×2 IMPLANT
DRAPE U-SHAPE 47X51 STRL (DRAPES) ×3 IMPLANT
DRSG ADAPTIC 3X8 NADH LF (GAUZE/BANDAGES/DRESSINGS) ×3 IMPLANT
DRSG PAD ABDOMINAL 8X10 ST (GAUZE/BANDAGES/DRESSINGS) ×3 IMPLANT
DURAPREP 26ML APPLICATOR (WOUND CARE) ×3 IMPLANT
ELECT BLADE TIP CTD 4 INCH (ELECTRODE) ×3 IMPLANT
ELECT NDL TIP 2.8 STRL (NEEDLE) ×1 IMPLANT
ELECT NEEDLE TIP 2.8 STRL (NEEDLE) ×3 IMPLANT
ELECT REM PT RETURN 15FT ADLT (MISCELLANEOUS) ×3 IMPLANT
GAUZE SPONGE 4X4 12PLY STRL (GAUZE/BANDAGES/DRESSINGS) ×3 IMPLANT
GLOVE BIOGEL PI ORTHO PRO 7.5 (GLOVE) ×2
GLOVE BIOGEL PI ORTHO PRO SZ8 (GLOVE) ×2
GLOVE ORTHO TXT STRL SZ7.5 (GLOVE) ×3 IMPLANT
GLOVE PI ORTHO PRO STRL 7.5 (GLOVE) ×1 IMPLANT
GLOVE PI ORTHO PRO STRL SZ8 (GLOVE) ×1 IMPLANT
GLOVE SURG ORTHO 8.5 STRL (GLOVE) ×3 IMPLANT
GOWN STRL REUS W/TWL XL LVL3 (GOWN DISPOSABLE) ×6 IMPLANT
INSERT HUM BEARING 36 +3 (Insert) ×2 IMPLANT
KIT BASIN OR (CUSTOM PROCEDURE TRAY) ×3 IMPLANT
KIT TURNOVER KIT A (KITS) IMPLANT
MANIFOLD NEPTUNE II (INSTRUMENTS) ×3 IMPLANT
NDL MAYO CATGUT SZ4 TPR NDL (NEEDLE) IMPLANT
NEEDLE MAYO CATGUT SZ4 (NEEDLE) IMPLANT
NS IRRIG 1000ML POUR BTL (IV SOLUTION) ×3 IMPLANT
PACK SHOULDER (CUSTOM PROCEDURE TRAY) ×3 IMPLANT
PENCIL SMOKE EVACUATOR (MISCELLANEOUS) IMPLANT
PROTECTOR NERVE ULNAR (MISCELLANEOUS) ×3 IMPLANT
RESTRAINT HEAD UNIVERSAL NS (MISCELLANEOUS) ×3 IMPLANT
SLING ARM FOAM STRAP LRG (SOFTGOODS) IMPLANT
SLING ULTRA II AB MED (SLING) IMPLANT
SLING ULTRA III MED (ORTHOPEDIC SUPPLIES) ×2 IMPLANT
SMARTMIX MINI TOWER (MISCELLANEOUS)
SPONGE LAP 4X18 RFD (DISPOSABLE) IMPLANT
STAPLER VISISTAT 35W (STAPLE) ×2 IMPLANT
STRIP CLOSURE SKIN 1/2X4 (GAUZE/BANDAGES/DRESSINGS) ×2 IMPLANT
SUCTION FRAZIER HANDLE 10FR (MISCELLANEOUS) ×2
SUCTION TUBE FRAZIER 10FR DISP (MISCELLANEOUS) ×1 IMPLANT
SUT FIBERWIRE #2 38 T-5 BLUE (SUTURE) ×3
SUT MNCRL AB 4-0 PS2 18 (SUTURE) ×3 IMPLANT
SUT PDS AB 0 CT1 36 (SUTURE) ×2 IMPLANT
SUT VIC AB 0 CT1 36 (SUTURE) ×6 IMPLANT
SUT VIC AB 0 CT2 27 (SUTURE) ×3 IMPLANT
SUT VIC AB 2-0 CT1 27 (SUTURE) ×2
SUT VIC AB 2-0 CT1 TAPERPNT 27 (SUTURE) ×1 IMPLANT
SUTURE FIBERWR #2 38 T-5 BLUE (SUTURE) ×1 IMPLANT
TAPE CLOTH SURG 4X10 WHT LF (GAUZE/BANDAGES/DRESSINGS) ×2 IMPLANT
TAPE CLOTH SURG 6X10 WHT LF (GAUZE/BANDAGES/DRESSINGS) ×2 IMPLANT
TOWEL OR 17X26 10 PK STRL BLUE (TOWEL DISPOSABLE) ×3 IMPLANT
TOWER SMARTMIX MINI (MISCELLANEOUS) IMPLANT
TRAY HUM REV SHOULDER STD +6 (Shoulder) ×2 IMPLANT
YANKAUER SUCT BULB TIP 10FT TU (MISCELLANEOUS) ×3 IMPLANT

## 2019-08-06 NOTE — Anesthesia Procedure Notes (Signed)
Procedure Name: Intubation Date/Time: 08/06/2019 4:39 PM Performed by: Silas [place], CRNA Pre-anesthesia Checklist: Patient identified, Emergency Drugs available, Suction available and Patient being monitored Patient Re-evaluated:Patient Re-evaluated prior to induction Oxygen Delivery Method: Circle system utilized Preoxygenation: Pre-oxygenation with 100% oxygen Induction Type: IV induction Ventilation: Mask ventilation without difficulty Laryngoscope Size: Mac and 3 Grade View: Grade I Tube type: Oral Tube size: 7.5 mm Number of attempts: 1 Airway Equipment and Method: Stylet and Oral airway Placement Confirmation: ETT inserted through vocal cords under direct vision,  positive ETCO2 and breath sounds checked- equal and bilateral Secured at: 22 cm Tube secured with: Tape Dental Injury: Teeth and Oropharynx as per pre-operative assessment

## 2019-08-06 NOTE — Anesthesia Procedure Notes (Signed)
Anesthesia Regional Block: Interscalene brachial plexus block   Pre-Anesthetic Checklist: ,, timeout performed, Correct Patient, Correct Site, Correct Laterality, Correct Procedure, Correct Position, site marked, Risks and benefits discussed,  Surgical consent,  Pre-op evaluation,  At surgeon's request and post-op pain management  Laterality: Right and Upper  Prep: chloraprep       Needles:  Injection technique: Single-shot  Needle Type: Echogenic Stimulator Needle     Needle Length: 5cm  Needle Gauge: 22   Needle insertion depth: 0.5 cm   Additional Needles:   Procedures:,,,, ultrasound used (permanent image in chart),,,,  Narrative:  Start time: 08/06/2019 2:20 PM End time: 08/06/2019 2:30 PM Injection made incrementally with aspirations every 5 mL.  Performed by: Personally  Anesthesiologist: Lyn Hollingshead, MD

## 2019-08-06 NOTE — Discharge Instructions (Signed)
Ice to the shoulder constantly.  Keep the incision covered and clean and dry for one week, then ok to get it wet in the shower.  Stay in your black pillow sling unless showering.  Ok to change the bandage in two days and then change it daily with fresh sterile gauze   Follow up with Dr Veverly Fells in two weeks in the office, call [phone] for appt    General Anesthesia, Adult, Care After This sheet gives you information about how to care for yourself after your procedure. Your health care provider may also give you more specific instructions. If you have problems or questions, contact your health care provider. What can I expect after the procedure? After the procedure, the following side effects are common:  Pain or discomfort at the IV site.  Nausea.  Vomiting.  Sore throat.  Trouble concentrating.  Feeling cold or chills.  Weak or tired.  Sleepiness and fatigue.  Soreness and body aches. These side effects can affect parts of the body that were not involved in surgery. Follow these instructions at home:  For at least 24 hours after the procedure:  Have a responsible adult stay with you. It is important to have someone help care for you until you are awake and alert.  Rest as needed.  Do not: ? Participate in activities in which you could fall or become injured. ? Drive. ? Use heavy machinery. ? Drink alcohol. ? Take sleeping pills or medicines that cause drowsiness. ? Make important decisions or sign legal documents. ? Take care of children on your own. Eating and drinking  Follow any instructions from your health care provider about eating or drinking restrictions.  When you feel hungry, start by eating small amounts of foods that are soft and easy to digest (bland), such as toast. Gradually return to your regular diet.  Drink enough fluid to keep your urine pale yellow.  If you vomit, rehydrate by drinking water, juice, or clear broth. General  instructions  If you have sleep apnea, surgery and certain medicines can increase your risk for breathing problems. Follow instructions from your health care provider about wearing your sleep device: ? Anytime you are sleeping, including during daytime naps. ? While taking prescription pain medicines, sleeping medicines, or medicines that make you drowsy.  Return to your normal activities as told by your health care provider. Ask your health care provider what activities are safe for you.  Take over-the-counter and prescription medicines only as told by your health care provider.  If you smoke, do not smoke without supervision.  Keep all follow-up visits as told by your health care provider. This is important. Contact a health care provider if:  You have nausea or vomiting that does not get better with medicine.  You cannot eat or drink without vomiting.  You have pain that does not get better with medicine.  You are unable to pass urine.  You develop a skin rash.  You have a fever.  You have redness around your IV site that gets worse. Get help right away if:  You have difficulty breathing.  You have chest pain.  You have blood in your urine or stool, or you vomit blood. Summary  After the procedure, it is common to have a sore throat or nausea. It is also common to feel tired.  Have a responsible adult stay with you for the first 24 hours after general anesthesia. It is important to have someone help care for  you until you are awake and alert.  When you feel hungry, start by eating small amounts of foods that are soft and easy to digest (bland), such as toast. Gradually return to your regular diet.  Drink enough fluid to keep your urine pale yellow.  Return to your normal activities as told by your health care provider. Ask your health care provider what activities are safe for you. This information is not intended to replace advice given to you by your health care  provider. Make sure you discuss any questions you have with your health care provider. Document Released: 11/27/2000 Document Revised: 08/24/2017 Document Reviewed: 04/06/2017 Elsevier Patient Education  2020 [employer].

## 2019-08-06 NOTE — Telephone Encounter (Signed)
Pt's pharmacy is requesting a refill on Alprazolam. Would Dr. Radford Pax like to refill this medication? Please address

## 2019-08-06 NOTE — Brief Op Note (Signed)
08/06/2019  5:19 PM  PATIENT:  Joshua Hudson  84 y.o. male  PRE-OPERATIVE DIAGNOSIS:  Dislocated reverse total shoulder  POST-OPERATIVE DIAGNOSIS:  Dislocated reverse total shoulder  PROCEDURE:  Open reduction of dislocated reverse total shoulder replacement, exchange of humeral tray and polyethylene spacer  SURGEON:  Surgeon(s) and Role:    Netta Cedars, MD - Primary  PHYSICIAN ASSISTANT:   ASSISTANTS: none   ANESTHESIA:   General plus ISB  EBL:  100 mL   BLOOD ADMINISTERED:none  DRAINS:none   LOCAL MEDICATIONS USED:  MARCAINE     SPECIMEN:  Source of Specimen:  deep right shoulder tissue  DISPOSITION OF SPECIMEN:  micro  COUNTS:  YES  TOURNIQUET:  * No tourniquets in log *  DICTATION: other dictation number: [id number]  PLAN OF CARE: Discharge to home after PACU  PATIENT DISPOSITION:  PACU - hemodynamically stable.   Delay start of Pharmacological VTE agent (>24hrs) due to surgical blood loss or risk of bleeding: not applicable

## 2019-08-06 NOTE — Anesthesia Preprocedure Evaluation (Addendum)
Anesthesia Evaluation  Patient identified by MRN, date of birth, ID band Patient awake    Reviewed: Allergy & Precautions, NPO status , Patient's Chart, lab work & pertinent test results, reviewed documented beta blocker date and time   Airway Mallampati: I       Dental no notable dental hx. (+) Teeth Intact   Pulmonary former smoker,    Pulmonary exam normal breath sounds clear to auscultation       Cardiovascular hypertension, Pt. on home beta blockers +CHF  Normal cardiovascular exam Rhythm:Regular Rate:Normal     Neuro/Psych negative neurological ROS  negative psych ROS   GI/Hepatic GERD  Medicated and Controlled,  Endo/Other    Renal/GU      Musculoskeletal   Abdominal (+) + scaphoid   Peds  Hematology   Anesthesia Other Findings  1. The average left ventricular global longitudinal strain is normal at -21.3 %.  2. The left ventricle has normal systolic function, with an ejection fraction of 55-60%. The cavity size was normal. Left ventricular diastolic Doppler parameters are consistent with pseudonormalization. No evidence of left ventricular regional wall  motion abnormalities.  3. The right ventricle has mildly reduced systolic function. The cavity was mildly enlarged. There is no increase in right ventricular wall thickness. Right ventricular systolic pressure is mildly elevated with an estimated pressure of 38.1 mmHg.  4. Left atrial size was mildly dilated.  5. Right atrial size was severely dilated.  6. There is moderate mitral annular calcification present.  7. A 26 Edwards Sapien bioprosthetic aortic valve (TAVR) valve is present in the aortic position. Procedure Date: 04/15/19 Normal aortic valve prosthesis.  8. The aorta is normal unless otherwise noted.  9. - TAVR: S/P 46mm Edwards Sapien bioprosthetic AVR that appears to have normal function. The mean AVG is normal at 77mmHg, peak AV velocity 254 cm/s,  AVA 1.84cm2 by VTI and dimensionless index 0.48. There is trivial perivalvular AI. 10. The inferior vena cava was normal in size with <50% respiratory variability.   Reproductive/Obstetrics                                                             Anesthesia Evaluation  Patient identified by MRN, date of birth, ID band Patient awake    Reviewed: Allergy & Precautions, H&P , NPO status , Patient's Chart, lab work & pertinent test results, reviewed documented beta blocker date and time   Airway Mallampati: II   Neck ROM: Limited  Mouth opening: Limited Mouth Opening  Dental no notable dental hx. (+) Dental Advisory Given   Pulmonary neg pulmonary ROS, former smoker,    Pulmonary exam normal breath sounds clear to auscultation       Cardiovascular Exercise Tolerance: Good hypertension, Pt. on medications and Pt. on home beta blockers  Rhythm:Regular Rate:Normal     Neuro/Psych negative neurological ROS  negative psych ROS   GI/Hepatic Neg liver ROS, GERD  Medicated and Controlled,  Endo/Other  negative endocrine ROS  Renal/GU negative Renal ROS  negative genitourinary   Musculoskeletal  (+) Arthritis , Osteoarthritis,    Abdominal   Peds  Hematology  (+) Blood dyscrasia, anemia ,   Anesthesia Other Findings   Reproductive/Obstetrics negative OB ROS  Anesthesia Physical Anesthesia Plan  ASA: III  Anesthesia Plan: General   Post-op Pain Management:  Regional for Post-op pain   Induction: Intravenous  PONV Risk Score and Plan: 2 and Ondansetron, Dexamethasone and Treatment may vary due to age or medical condition  Airway Management Planned: Oral ETT  Additional Equipment:   Intra-op Plan:   Post-operative Plan: Extubation in OR  Informed Consent: I have reviewed the patients History and Physical, chart, labs and discussed the procedure including the risks, benefits  and alternatives for the proposed anesthesia with the patient or authorized representative who has indicated his/her understanding and acceptance.     Dental advisory given  Plan Discussed with: CRNA  Anesthesia Plan Comments: (See PAT note 07/15/2019, Konrad Felix, PA-C)       Anesthesia Quick Evaluation                                   Anesthesia Evaluation  Patient identified by MRN, date of birth, ID band Patient awake    Reviewed: Allergy & Precautions, H&P , NPO status , Patient's Chart, lab work & pertinent test results, reviewed documented beta blocker date and time   Airway Mallampati: II   Neck ROM: Limited  Mouth opening: Limited Mouth Opening  Dental no notable dental hx. (+) Dental Advisory Given   Pulmonary neg pulmonary ROS, former smoker,    Pulmonary exam normal breath sounds clear to auscultation       Cardiovascular Exercise Tolerance: Good hypertension, Pt. on medications and Pt. on home beta blockers  Rhythm:Regular Rate:Normal     Neuro/Psych negative neurological ROS  negative psych ROS   GI/Hepatic Neg liver ROS, GERD  Medicated and Controlled,  Endo/Other  negative endocrine ROS  Renal/GU negative Renal ROS  negative genitourinary   Musculoskeletal  (+) Arthritis , Osteoarthritis,    Abdominal   Peds  Hematology  (+) Blood dyscrasia, anemia ,   Anesthesia Other Findings   Reproductive/Obstetrics negative OB ROS                          Anesthesia Physical Anesthesia Plan  ASA: III  Anesthesia Plan: General   Post-op Pain Management:  Regional for Post-op pain   Induction: Intravenous  PONV Risk Score and Plan: 2 and Ondansetron, Dexamethasone and Treatment may vary due to age or medical condition  Airway Management Planned: Oral ETT  Additional Equipment:   Intra-op Plan:   Post-operative Plan: Extubation in OR  Informed Consent: I have reviewed the patients History and  Physical, chart, labs and discussed the procedure including the risks, benefits and alternatives for the proposed anesthesia with the patient or authorized representative who has indicated his/her understanding and acceptance.     Dental advisory given  Plan Discussed with: CRNA  Anesthesia Plan Comments: (See PAT note 07/15/2019, Konrad Felix, PA-C)       Anesthesia Quick Evaluation  Anesthesia Physical Anesthesia Plan  ASA: III  Anesthesia Plan: General   Post-op Pain Management:  Regional for Post-op pain   Induction: Intravenous  PONV Risk Score and Plan: 2 and Ondansetron and Treatment may vary due to age or medical condition  Airway Management Planned: Oral ETT  Additional Equipment: None  Intra-op Plan:   Post-operative Plan: Extubation in OR  Informed Consent: I have reviewed the patients History and Physical, chart, labs and discussed the procedure including the risks,  benefits and alternatives for the proposed anesthesia with the patient or authorized representative who has indicated his/her understanding and acceptance.     Dental advisory given  Plan Discussed with: CRNA  Anesthesia Plan Comments:         Anesthesia Quick Evaluation

## 2019-08-06 NOTE — Transfer of Care (Signed)
Immediate Anesthesia Transfer of Care Note  Patient: Joshua Hudson  Procedure(s) Performed: Reduction of dislocated reverse total shoulder and poly exchange (Right Shoulder)  Patient Location: PACU  Anesthesia Type:GA combined with regional for post-op pain  Level of Consciousness: drowsy, patient cooperative and responds to stimulation  Airway & Oxygen Therapy: Patient Spontanous Breathing and Patient connected to face mask oxygen  Post-op Assessment: Report given to RN and Post -op Vital signs reviewed and stable  Post vital signs: Reviewed and stable   Last Vitals:  Vitals Value Taken Time  BP 105/65 08/06/19 1707  Temp    Pulse 99 08/06/19 1710  Resp 29 08/06/19 1710  SpO2 100 % 08/06/19 1710  Vitals shown include unvalidated device data.  Last Pain:  Vitals:   08/06/19 1500  TempSrc:   PainSc: 0-No pain      Patients Stated Pain Goal: 4 ([id number])  Complications: No apparent anesthesia complications

## 2019-08-06 NOTE — Progress Notes (Signed)
Assisted Dr. Hatchett with right, ultrasound guided, interscalene  block. Side rails up, monitors on throughout procedure. See vital signs in flow sheet. Tolerated Procedure well.  

## 2019-08-06 NOTE — Interval H&P Note (Signed)
History and Physical Interval Note:  08/06/2019 3:23 PM  Joshua Hudson  has presented today for surgery, with the diagnosis of Dislocated reverse total shoulder.  The various methods of treatment have been discussed with the patient and family. After consideration of risks, benefits and other options for treatment, the patient has consented to  Procedure(s) with comments: Reduction of dislocated reverse total shoulder and poly exchange (Right) - need 1 hour as a surgical intervention.  The patient's history has been reviewed, patient examined, no change in status, stable for surgery.  I have reviewed the patient's chart and labs.  Questions were answered to the patient's satisfaction.     [name]

## 2019-08-06 NOTE — Op Note (Signed)
NAME: Joshua Hudson, Joshua Hudson MEDICAL RECORD [id number] ACCOUNT [id number] DATE OF BIRTH:Apr 28, 1935 FACILITY: [facility] LOCATION: [facility] PHYSICIAN:STEVEN Orlena Sheldon, MD  OPERATIVE REPORT  DATE OF PROCEDURE:  08/06/2019  PREOPERATIVE DIAGNOSIS:  Right dislocated reverse shoulder replacement.  POSTOPERATIVE DIAGNOSIS:  Right dislocated reverse shoulder replacement.  PROCEDURES PERFORMED:  Open reduction and humeral tray exchange and polyethylene exchange, right shoulder replacement.  ATTENDING SURGEON:   Esmond Plants, MD  ASSISTANT:  None.  ANESTHESIA:  General anesthesia was used plus interscalene block.  ESTIMATED BLOOD LOSS:  Minimal.  FLUID REPLACEMENT:  1000 mL crystalloid.  INSTRUMENT COUNTS:  Correct.  COMPLICATIONS:  None.  ANTIBIOTICS:  Perioperative antibiotics were given.  INDICATIONS:  The patient is an 84 year old male who suffered numerous right shoulder instability episodes following reverse shoulder replacement which was done over a year ago for a rotator cuff tear arthropathy condition.  The patient has developed  soft tissue insufficiency, creating a setup for instability.  The patient then developed an infection in his shoulder, requiring removal of his reverse shoulder replacement and placement of an antibiotic spacer.  The patient underwent successful  reimplantation 2-3 weeks ago at [facility].  The patient does not realize when he dislocated his shoulder, but presented to the orthopedic outpatient clinic with a dislocated reverse shoulder replacement.  He presents now for open reduction and  polyethylene exchange.  Risks and benefits of surgical management discussed with the patient.  Informed consent obtained.  DESCRIPTION OF PROCEDURE:  After an adequate level of anesthesia was achieved, the patient was positioned in a modified beach chair position.  Right shoulder correctly identified and sterilely prepped and draped in the usual manner.  Time-out called,   verifying correct patient, correct site.  The patient did have obvious deformity of  the shoulder consistent with a dislocation.  I did do a manipulation with him closed and was able to with difficulty getting him reduced with an audible clunk and  reestablishment of a nice smooth motion.  With him reduced, I could not re-dislocate him despite levering him in different directions and pulling and distracting, but could not dislocate him again.  We then went ahead and sterilely prepped and draped the  shoulder and arm.  We opened the shoulder to the patient's standard deltopectoral incision, down through the subcutaneous tissues, used  very little Bovie, but we were able to open up the interval and removed as much suture as possible.  Some seroma was  encountered.  No bleeding.  The shoulder was reduced.  Again, I had great difficulty dislocating the shoulder even with it open, but could pull it inferiorly on the shoulder and get it to distract 1 or 2 mm.  I was unable to dislocate the shoulder.  We  went ahead and removed the tray off the humeral stem and then trialled with the +3 and then went with actually the +6 tray and then a +3 poly.  We eventually went up, going with a +6 tray, which provided a little bit more offset for the tray and then  that decreased the distal tensioning on the deltoid and on the soft tissues and then we did more offset with a +3 poly, which built him out laterally a little bit more and then we went with the high retentive component.  This provided outstanding  security.  Once we had that impacted in place and reduced, there was no gapping, no ability to distract or to create an instability situation.  There was no impingement.  We irrigated very well with about a liter of irrigation with normal saline  irrigation and then closed the deep layer with 0 PDS, followed by 2-0 Vicryl and staples for skin.  Sterile dressing and shoulder abduction pillow and sling placed.  The patient  was transported to the recovery room in stable condition.  VN/NUANCE  D:08/06/2019 T:08/06/2019 JOB:[id number]

## 2019-08-07 ENCOUNTER — Other Ambulatory Visit: Payer: Self-pay | Admitting: Gastroenterology

## 2019-08-07 ENCOUNTER — Encounter (HOSPITAL_COMMUNITY): Payer: Self-pay | Admitting: Orthopedic Surgery

## 2019-08-07 ENCOUNTER — Encounter: Payer: Self-pay | Admitting: Hematology and Oncology

## 2019-08-07 NOTE — Anesthesia Postprocedure Evaluation (Signed)
Anesthesia Post Note  Patient: Joshua Hudson  Procedure(s) Performed: Reduction of dislocated reverse total shoulder and poly exchange (Right Shoulder)     Patient location during evaluation: PACU Anesthesia Type: General Level of consciousness: awake Pain management: pain level controlled Vital Signs Assessment: post-procedure vital signs reviewed and stable Respiratory status: spontaneous breathing Cardiovascular status: stable Postop Assessment: no apparent nausea or vomiting Anesthetic complications: no    Last Vitals:  Vitals:   08/06/19 1745 08/06/19 1758  BP: (!) 88/62 (!) 87/70  Pulse: (!) 48 (!) 106  Resp: 14 12  Temp: 36.4 C 36.4 C  SpO2: 97% 100%    Last Pain:  Vitals:   08/06/19 1758  TempSrc:   PainSc: 0-No pain   Pain Goal: Patients Stated Pain Goal: 4 (08/06/19 1413)                 [name]

## 2019-08-08 ENCOUNTER — Telehealth: Payer: Self-pay | Admitting: Hematology and Oncology

## 2019-08-08 NOTE — Telephone Encounter (Signed)
Confirmed 1/8 appointment with dtr.

## 2019-08-08 NOTE — Discharge Summary (Signed)
Orthopedic Discharge Summary        Physician Discharge Summary  Patient ID: Joshua Hudson MRN: [id number] DOB/AGE: 11/26/1934 84 y.o.  Admit date: 08/06/2019 Discharge date: 08/08/2019   Procedures:  Procedure(s) (LRB): Reduction of dislocated reverse total shoulder and poly exchange (Right)  Attending Physician:  Dr. Esmond Plants  Admission Diagnoses:   Dislocated right reverse total shoulder replacement  Discharge Diagnoses:  same   Past Medical History:  Diagnosis Date  . Achalasia 06/12/2019   Noted on EGD  . Aortic insufficiency 03/24/2019   AI of bioprosthetic AVR severe 4 plus  . Chronic diastolic CHF (congestive heart failure) ([place]) 03/24/2019  . Colon polyps    s/p diverticular perforation requiring 2-stage repair  . Derangement of right shoulder joint    need replacing has no use of  . Diverticulosis   . DJD (degenerative joint disease), lumbosacral   . Dysphagia    eats soft food  . Esophageal stricture   . GERD (gastroesophageal reflux disease)   . Hemolytic anemia ([place])   . History of blood transfusion given with 04-15-19 surgery  . History of blood transfusion 07/18/2019  . History of kidney stones    passed stones  . Hypertension   . Mitral regurgitation    moderate  . Occipital neuralgia   . Pancytopenia ([place])   . Postoperative atrial fibrillation ([place]) 05/10/2015  . Prosthetic valve dysfunction   . Rheumatoid arthritis ([place])    s/o long term steroids  shoulders and hands  . Rotator cuff arthropathy    right  . S/P aortic valve replacement with bioprosthetic valve 01/16/2008   77mm Edwards Magna perimount bovine pericardial tissue valve, model 3000  . S/P valve-in-valve TAVR 04/15/2019   26 mm Edwards Sapien 3 Ultra transcatheter heart valve placed via percutaneous right transfemoral approach   . SBE (subacute bacterial endocarditis) prophylaxis candidate    for dental procedures  . Schatzki's ring 06/12/2019   Narrowing, Noted on EGD   . Severe aortic stenosis    S/P prosthetic valve replacement w 25 mm Edwards like science percardial tissue valve,Turner - 01/2008  . Thrombocytopenia ([place])     PCP: Lavone Orn, MD   Discharged Condition: good  Hospital Course:  Patient underwent the above stated procedure on 08/06/2019. Patient tolerated the procedure well and brought to the recovery room in good condition and subsequently to the floor. Patient had an uncomplicated hospital course and was stable for discharge.   Disposition:  with follow up in 2 weeks   Follow-up Information    Netta Cedars, MD. Call in 2 weeks.   Specialty: Orthopedic Surgery Why: [phone] Contact information: [address] [place] [id number]           Discharge Instructions    Call MD / Call 911   Complete by: As directed    If you experience chest pain or shortness of breath, CALL 911 and be transported to the hospital emergency room.  If you develope a fever above 101 F, pus (white drainage) or increased drainage or redness at the wound, or calf pain, call your surgeon's office.   Constipation Prevention   Complete by: As directed    Drink plenty of fluids.  Prune juice may be helpful.  You may use a stool softener, such as Colace (over the counter) 100 mg twice a day.  Use MiraLax (over the counter) for constipation as needed.   Diet - low sodium heart healthy  Complete by: As directed    Increase activity slowly as tolerated   Complete by: As directed       Allergies as of 08/06/2019   No Known Allergies     Medication List    TAKE these medications   acetaminophen 500 MG tablet Commonly known as: TYLENOL Take 1,000 mg by mouth every 6 (six) hours as needed for moderate pain or headache.   ALPRAZolam 0.25 MG tablet Commonly known as: XANAX Take 1 tablet (0.25 mg total) by mouth at bedtime as needed for anxiety or sleep.   aspirin 81 MG EC tablet Take 1 tablet (81 mg total) by  mouth daily.   cholecalciferol 25 MCG (1000 UT) tablet Commonly known as: VITAMIN D3 Take 1,000 Units by mouth daily.   clopidogrel 75 MG tablet Commonly known as: PLAVIX Take 1 tablet (75 mg total) by mouth daily with breakfast.   feeding supplement (ENSURE ENLIVE) Liqd Take 237 mLs by mouth 3 (three) times daily between meals.   finasteride 5 MG tablet Commonly known as: PROSCAR Take 5 mg by mouth daily.   folic acid 1 MG tablet Commonly known as: FOLVITE Take 1 mg by mouth daily.   furosemide 40 MG tablet Commonly known as: LASIX Take 40 mg daily. What changed:   how much to take  how to take this  when to take this  additional instructions   HYDROcodone-acetaminophen 5-325 MG tablet Commonly known as: Norco Take 1 tablet by mouth every 6 (six) hours as needed for moderate pain or severe pain.   MEGARED OMEGA-3 KRILL OIL PO Take 750 mg by mouth daily.   Melatonin 3 MG Tabs Take 3 mg by mouth at bedtime as needed (sleep).   metoprolol succinate 25 MG 24 hr tablet Commonly known as: TOPROL-XL Take 1 tablet (25 mg total) by mouth daily.   multivitamin with minerals Tabs tablet Take 1 tablet by mouth daily.   pantoprazole 40 MG tablet Commonly known as: PROTONIX Take 40 mg by mouth daily before breakfast.   potassium chloride SA 20 MEQ tablet Commonly known as: KLOR-CON Take 20 meq daily What changed:   how much to take  how to take this  when to take this  additional instructions   predniSONE 5 MG tablet Commonly known as: DELTASONE Take 5 mg by mouth daily with breakfast.   Theratears 0.25 % Soln Generic drug: Carboxymethylcellulose Sodium Place 1 drop into both eyes 3 (three) times daily as needed (dry/irritated eyes.).   Turmeric 500 MG Caps Take 500 mg by mouth daily.   vitamin B-12 1000 MCG tablet Commonly known as: CYANOCOBALAMIN Take 1,000 mcg by mouth daily.         Signed: [name] 08/08/2019, 10:01 AM   [facility] is now [employer] [address] [place], [place], [place] [address] Phone: [place]

## 2019-08-11 LAB — AEROBIC/ANAEROBIC CULTURE W GRAM STAIN (SURGICAL/DEEP WOUND): Culture: NO GROWTH

## 2019-08-12 ENCOUNTER — Ambulatory Visit: Payer: Medicare Other | Admitting: Hematology and Oncology

## 2019-08-12 ENCOUNTER — Other Ambulatory Visit: Payer: Medicare Other

## 2019-09-11 ENCOUNTER — Encounter: Payer: Self-pay | Admitting: Hematology and Oncology

## 2019-09-12 ENCOUNTER — Telehealth: Payer: Self-pay | Admitting: Hematology and Oncology

## 2019-09-12 ENCOUNTER — Telehealth: Payer: Self-pay | Admitting: *Deleted

## 2019-09-12 ENCOUNTER — Other Ambulatory Visit: Payer: Self-pay

## 2019-09-12 ENCOUNTER — Other Ambulatory Visit: Payer: Medicare Other

## 2019-09-12 ENCOUNTER — Ambulatory Visit: Payer: Medicare Other

## 2019-09-12 ENCOUNTER — Ambulatory Visit: Payer: Medicare Other | Admitting: Hematology and Oncology

## 2019-09-12 DIAGNOSIS — D696 Thrombocytopenia, unspecified: Secondary | ICD-10-CM

## 2019-09-12 DIAGNOSIS — D61818 Other pancytopenia: Secondary | ICD-10-CM

## 2019-09-12 DIAGNOSIS — D638 Anemia in other chronic diseases classified elsewhere: Secondary | ICD-10-CM

## 2019-09-12 NOTE — Telephone Encounter (Signed)
Records faxed to [facility] - release [phone]

## 2019-09-12 NOTE — Telephone Encounter (Signed)
R/s appt per 1/7 sch message - spoke with patients daughter . She is aware of appt date and time

## 2019-09-15 ENCOUNTER — Encounter (HOSPITAL_COMMUNITY): Payer: Self-pay | Admitting: Gastroenterology

## 2019-09-18 ENCOUNTER — Other Ambulatory Visit (HOSPITAL_COMMUNITY)
Admission: RE | Admit: 2019-09-18 | Discharge: 2019-09-18 | Disposition: A | Discharge status: Home / Self Care | Payer: Medicare PPO | Source: Ambulatory Visit | Attending: Gastroenterology | Admitting: Gastroenterology

## 2019-09-18 DIAGNOSIS — Z01812 Encounter for preprocedural laboratory examination: Secondary | ICD-10-CM | POA: Insufficient documentation

## 2019-09-18 DIAGNOSIS — Z20822 Contact with and (suspected) exposure to covid-19: Secondary | ICD-10-CM | POA: Diagnosis not present

## 2019-09-19 LAB — NOVEL CORONAVIRUS, NAA (HOSP ORDER, SEND-OUT TO REF LAB; TAT 18-24 HRS): SARS-CoV-2, NAA: NOT DETECTED

## 2019-09-19 NOTE — Progress Notes (Signed)
preop call to patient, patient has gone to get covid test, understands to quarantine over weekend.  Has unit number if needs to leave message.  Answered questions.  Arrival time 0600 on Monday.

## 2019-09-21 ENCOUNTER — Encounter (HOSPITAL_COMMUNITY): Payer: Self-pay | Admitting: Anesthesiology

## 2019-09-21 NOTE — Anesthesia Preprocedure Evaluation (Deleted)
Anesthesia Evaluation    Reviewed: Allergy & Precautions, Patient's Chart, lab work & pertinent test results  Airway        Dental   Pulmonary former smoker,           Cardiovascular hypertension, Pt. on medications +CHF       Neuro/Psych negative neurological ROS  negative psych ROS   GI/Hepatic Neg liver ROS,   Endo/Other  negative endocrine ROS  Renal/GU      Musculoskeletal   Abdominal   Peds  Hematology  (+) anemia ,   Anesthesia Other Findings   Reproductive/Obstetrics                             Anesthesia Physical Anesthesia Plan  ASA: III  Anesthesia Plan: MAC   Post-op Pain Management:    Induction: Intravenous  PONV Risk Score and Plan: Treatment may vary due to age or medical condition  Airway Management Planned: Natural Airway and Nasal Cannula  Additional Equipment: None  Intra-op Plan:   Post-operative Plan:   Informed Consent:     Dental advisory given  Plan Discussed with:   Anesthesia Plan Comments: (EgD for Achalasia )        Anesthesia Quick Evaluation

## 2019-09-22 ENCOUNTER — Encounter (HOSPITAL_COMMUNITY)
Admission: RE | Disposition: A | Discharge status: Home / Self Care | Payer: Self-pay | Source: Home / Self Care | Attending: Gastroenterology

## 2019-09-22 ENCOUNTER — Inpatient Hospital Stay (HOSPITAL_COMMUNITY)
Admission: EM | Admit: 2019-09-22 | Discharge: 2019-09-25 | DRG: 308 | Disposition: A | Discharge status: Home / Self Care | Payer: Medicare PPO | Attending: Internal Medicine | Admitting: Internal Medicine

## 2019-09-22 ENCOUNTER — Other Ambulatory Visit: Payer: Self-pay

## 2019-09-22 ENCOUNTER — Emergency Department (HOSPITAL_COMMUNITY): Payer: Medicare PPO

## 2019-09-22 ENCOUNTER — Ambulatory Visit (HOSPITAL_COMMUNITY)
Admission: RE | Admit: 2019-09-22 | Discharge: 2019-09-22 | Disposition: A | Discharge status: Home / Self Care | Payer: Medicare PPO | Source: Home / Self Care | Attending: Gastroenterology | Admitting: Gastroenterology

## 2019-09-22 ENCOUNTER — Encounter (HOSPITAL_COMMUNITY): Payer: Self-pay | Admitting: Internal Medicine

## 2019-09-22 ENCOUNTER — Observation Stay (HOSPITAL_BASED_OUTPATIENT_CLINIC_OR_DEPARTMENT_OTHER): Payer: Medicare PPO

## 2019-09-22 DIAGNOSIS — I5021 Acute systolic (congestive) heart failure: Secondary | ICD-10-CM | POA: Diagnosis present

## 2019-09-22 DIAGNOSIS — I361 Nonrheumatic tricuspid (valve) insufficiency: Secondary | ICD-10-CM

## 2019-09-22 DIAGNOSIS — Z9842 Cataract extraction status, left eye: Secondary | ICD-10-CM

## 2019-09-22 DIAGNOSIS — Z7982 Long term (current) use of aspirin: Secondary | ICD-10-CM

## 2019-09-22 DIAGNOSIS — Z961 Presence of intraocular lens: Secondary | ICD-10-CM | POA: Diagnosis present

## 2019-09-22 DIAGNOSIS — Z7902 Long term (current) use of antithrombotics/antiplatelets: Secondary | ICD-10-CM

## 2019-09-22 DIAGNOSIS — N400 Enlarged prostate without lower urinary tract symptoms: Secondary | ICD-10-CM | POA: Diagnosis present

## 2019-09-22 DIAGNOSIS — Z538 Procedure and treatment not carried out for other reasons: Secondary | ICD-10-CM | POA: Insufficient documentation

## 2019-09-22 DIAGNOSIS — Z952 Presence of prosthetic heart valve: Secondary | ICD-10-CM | POA: Diagnosis not present

## 2019-09-22 DIAGNOSIS — R000 Tachycardia, unspecified: Secondary | ICD-10-CM | POA: Diagnosis present

## 2019-09-22 DIAGNOSIS — Z7952 Long term (current) use of systemic steroids: Secondary | ICD-10-CM

## 2019-09-22 DIAGNOSIS — K220 Achalasia of cardia: Secondary | ICD-10-CM | POA: Diagnosis present

## 2019-09-22 DIAGNOSIS — Z8261 Family history of arthritis: Secondary | ICD-10-CM

## 2019-09-22 DIAGNOSIS — I4892 Unspecified atrial flutter: Principal | ICD-10-CM | POA: Diagnosis present

## 2019-09-22 DIAGNOSIS — I480 Paroxysmal atrial fibrillation: Secondary | ICD-10-CM | POA: Diagnosis present

## 2019-09-22 DIAGNOSIS — I081 Rheumatic disorders of both mitral and tricuspid valves: Secondary | ICD-10-CM | POA: Diagnosis present

## 2019-09-22 DIAGNOSIS — M1990 Unspecified osteoarthritis, unspecified site: Secondary | ICD-10-CM | POA: Diagnosis present

## 2019-09-22 DIAGNOSIS — Z953 Presence of xenogenic heart valve: Secondary | ICD-10-CM

## 2019-09-22 DIAGNOSIS — I959 Hypotension, unspecified: Secondary | ICD-10-CM | POA: Diagnosis present

## 2019-09-22 DIAGNOSIS — Z79899 Other long term (current) drug therapy: Secondary | ICD-10-CM

## 2019-09-22 DIAGNOSIS — Z9841 Cataract extraction status, right eye: Secondary | ICD-10-CM

## 2019-09-22 DIAGNOSIS — Z8249 Family history of ischemic heart disease and other diseases of the circulatory system: Secondary | ICD-10-CM

## 2019-09-22 DIAGNOSIS — R7989 Other specified abnormal findings of blood chemistry: Secondary | ICD-10-CM | POA: Diagnosis present

## 2019-09-22 DIAGNOSIS — Z8719 Personal history of other diseases of the digestive system: Secondary | ICD-10-CM

## 2019-09-22 DIAGNOSIS — Z87442 Personal history of urinary calculi: Secondary | ICD-10-CM

## 2019-09-22 DIAGNOSIS — K219 Gastro-esophageal reflux disease without esophagitis: Secondary | ICD-10-CM | POA: Diagnosis not present

## 2019-09-22 DIAGNOSIS — I4891 Unspecified atrial fibrillation: Secondary | ICD-10-CM

## 2019-09-22 DIAGNOSIS — Z9861 Coronary angioplasty status: Secondary | ICD-10-CM

## 2019-09-22 DIAGNOSIS — I248 Other forms of acute ischemic heart disease: Secondary | ICD-10-CM | POA: Diagnosis present

## 2019-09-22 DIAGNOSIS — D509 Iron deficiency anemia, unspecified: Secondary | ICD-10-CM | POA: Diagnosis present

## 2019-09-22 DIAGNOSIS — Z86010 Personal history of colonic polyps: Secondary | ICD-10-CM

## 2019-09-22 DIAGNOSIS — R778 Other specified abnormalities of plasma proteins: Secondary | ICD-10-CM | POA: Diagnosis not present

## 2019-09-22 DIAGNOSIS — M069 Rheumatoid arthritis, unspecified: Secondary | ICD-10-CM | POA: Diagnosis present

## 2019-09-22 DIAGNOSIS — D696 Thrombocytopenia, unspecified: Secondary | ICD-10-CM | POA: Diagnosis present

## 2019-09-22 DIAGNOSIS — I110 Hypertensive heart disease with heart failure: Secondary | ICD-10-CM | POA: Diagnosis present

## 2019-09-22 DIAGNOSIS — Z96611 Presence of right artificial shoulder joint: Secondary | ICD-10-CM | POA: Diagnosis present

## 2019-09-22 DIAGNOSIS — Z87891 Personal history of nicotine dependence: Secondary | ICD-10-CM

## 2019-09-22 DIAGNOSIS — Z20822 Contact with and (suspected) exposure to covid-19: Secondary | ICD-10-CM | POA: Diagnosis present

## 2019-09-22 LAB — CBC WITH DIFFERENTIAL/PLATELET
Abs Immature Granulocytes: 0.03 10*3/uL (ref 0.00–0.07)
Basophils Absolute: 0 10*3/uL (ref 0.0–0.1)
Basophils Relative: 1 %
Eosinophils Absolute: 0.3 10*3/uL (ref 0.0–0.5)
Eosinophils Relative: 4 %
HCT: 30.7 % — ABNORMAL LOW (ref 39.0–52.0)
Hemoglobin: 9.5 g/dL — ABNORMAL LOW (ref 13.0–17.0)
Immature Granulocytes: 1 %
Lymphocytes Relative: 19 %
Lymphs Abs: 1.2 10*3/uL (ref 0.7–4.0)
MCH: 22.4 pg — ABNORMAL LOW (ref 26.0–34.0)
MCHC: 30.9 g/dL (ref 30.0–36.0)
MCV: 72.2 fL — ABNORMAL LOW (ref 80.0–100.0)
Monocytes Absolute: 0.8 10*3/uL (ref 0.1–1.0)
Monocytes Relative: 12 %
Neutro Abs: 4.1 10*3/uL (ref 1.7–7.7)
Neutrophils Relative %: 63 %
Platelets: 142 10*3/uL — ABNORMAL LOW (ref 150–400)
RBC: 4.25 MIL/uL (ref 4.22–5.81)
RDW: 17.4 % — ABNORMAL HIGH (ref 11.5–15.5)
WBC: 6.5 10*3/uL (ref 4.0–10.5)
nRBC: 0.5 % — ABNORMAL HIGH (ref 0.0–0.2)

## 2019-09-22 LAB — TSH: TSH: 2.108 u[IU]/mL (ref 0.350–4.500)

## 2019-09-22 LAB — URINALYSIS, ROUTINE W REFLEX MICROSCOPIC
Bacteria, UA: NONE SEEN
Bilirubin Urine: NEGATIVE
Glucose, UA: NEGATIVE mg/dL
Hgb urine dipstick: NEGATIVE
Ketones, ur: 20 mg/dL — AB
Leukocytes,Ua: NEGATIVE
Nitrite: NEGATIVE
Protein, ur: 30 mg/dL — AB
Specific Gravity, Urine: 1.019 (ref 1.005–1.030)
pH: 6 (ref 5.0–8.0)

## 2019-09-22 LAB — BASIC METABOLIC PANEL
Anion gap: 8 (ref 5–15)
BUN: 19 mg/dL (ref 8–23)
CO2: 29 mmol/L (ref 22–32)
Calcium: 9 mg/dL (ref 8.9–10.3)
Chloride: 104 mmol/L (ref 98–111)
Creatinine, Ser: 1.21 mg/dL (ref 0.61–1.24)
GFR calc Af Amer: >60 mL/min (ref >60)
GFR calc non Af Amer: 55 mL/min — ABNORMAL LOW (ref >60)
Glucose, Bld: 89 mg/dL (ref 70–99)
Potassium: 3.6 mmol/L (ref 3.5–5.1)
Sodium: 141 mmol/L (ref 135–145)

## 2019-09-22 LAB — ECHOCARDIOGRAM COMPLETE

## 2019-09-22 LAB — HEPATIC FUNCTION PANEL
ALT: 17 U/L (ref 0–44)
AST: 27 U/L (ref 15–41)
Albumin: 3.7 g/dL (ref 3.5–5.0)
Alkaline Phosphatase: 64 U/L (ref 38–126)
Bilirubin, Direct: 0.2 mg/dL (ref 0.0–0.2)
Indirect Bilirubin: 0.8 mg/dL (ref 0.3–0.9)
Total Bilirubin: 1 mg/dL (ref 0.3–1.2)
Total Protein: 6 g/dL — ABNORMAL LOW (ref 6.5–8.1)

## 2019-09-22 LAB — BRAIN NATRIURETIC PEPTIDE: B Natriuretic Peptide: 175.3 pg/mL — ABNORMAL HIGH (ref 0.0–100.0)

## 2019-09-22 LAB — SAMPLE TO BLOOD BANK

## 2019-09-22 LAB — MAGNESIUM: Magnesium: 2.2 mg/dL (ref 1.7–2.4)

## 2019-09-22 LAB — TROPONIN I (HIGH SENSITIVITY)
Troponin I (High Sensitivity): 28 ng/L — ABNORMAL HIGH (ref <18)
Troponin I (High Sensitivity): 32 ng/L — ABNORMAL HIGH (ref <18)

## 2019-09-22 SURGERY — CANCELLED PROCEDURE
Anesthesia: Monitor Anesthesia Care

## 2019-09-22 MED ORDER — CHLORHEXIDINE GLUCONATE CLOTH 2 % EX PADS
6.0000 | MEDICATED_PAD | Freq: Every day | CUTANEOUS | Status: DC
  Administered 2019-09-23 – 2019-09-25 (×3): 6 via TOPICAL

## 2019-09-22 MED ORDER — POTASSIUM CHLORIDE CRYS ER 20 MEQ PO TBCR
20.0000 meq | EXTENDED_RELEASE_TABLET | Freq: Every day | ORAL | Status: DC
  Administered 2019-09-23 – 2019-09-25 (×3): 20 meq via ORAL
  Filled 2019-09-22 (×3): qty 1

## 2019-09-22 MED ORDER — SODIUM CHLORIDE 0.9 % IV BOLUS
1000.0000 mL | Freq: Once | INTRAVENOUS | Status: AC
  Administered 2019-09-22: 1000 mL via INTRAVENOUS

## 2019-09-22 MED ORDER — FUROSEMIDE 40 MG PO TABS: 40.0000 mg | ORAL_TABLET | Freq: Every day | ORAL | Status: DC

## 2019-09-22 MED ORDER — FOLIC ACID 1 MG PO TABS
1.0000 mg | ORAL_TABLET | Freq: Every day | ORAL | Status: DC
  Administered 2019-09-22 – 2019-09-25 (×4): 1 mg via ORAL
  Filled 2019-09-22 (×4): qty 1

## 2019-09-22 MED ORDER — OXYMETAZOLINE HCL 0.05 % NA SOLN
1.0000 | Freq: Two times a day (BID) | NASAL | Status: DC | PRN
  Filled 2019-09-22: qty 15

## 2019-09-22 MED ORDER — DILTIAZEM LOAD VIA INFUSION
10.0000 mg | Freq: Once | INTRAVENOUS | Status: AC
  Administered 2019-09-22: 10 mg via INTRAVENOUS
  Filled 2019-09-22: qty 10

## 2019-09-22 MED ORDER — SODIUM CHLORIDE 0.9 % IV SOLN: INTRAVENOUS | Status: DC

## 2019-09-22 MED ORDER — LACTATED RINGERS IV SOLN: INTRAVENOUS | Status: DC

## 2019-09-22 MED ORDER — ACETAMINOPHEN 325 MG PO TABS
ORAL_TABLET | ORAL | Status: AC
  Filled 2019-09-22: qty 2

## 2019-09-22 MED ORDER — FUROSEMIDE 10 MG/ML IJ SOLN
20.0000 mg | Freq: Two times a day (BID) | INTRAMUSCULAR | Status: DC
  Administered 2019-09-22 – 2019-09-25 (×6): 20 mg via INTRAVENOUS
  Filled 2019-09-22 (×4): qty 2
  Filled 2019-09-22: qty 4
  Filled 2019-09-22: qty 2

## 2019-09-22 MED ORDER — FUROSEMIDE 10 MG/ML IJ SOLN: 40.0000 mg | Freq: Two times a day (BID) | INTRAMUSCULAR | Status: DC

## 2019-09-22 MED ORDER — PANTOPRAZOLE SODIUM 40 MG PO TBEC
40.0000 mg | DELAYED_RELEASE_TABLET | Freq: Every day | ORAL | Status: DC
  Administered 2019-09-23 – 2019-09-25 (×3): 40 mg via ORAL
  Filled 2019-09-22 (×3): qty 1

## 2019-09-22 MED ORDER — HEPARIN (PORCINE) 25000 UT/250ML-% IV SOLN
650.0000 [IU]/h | INTRAVENOUS | Status: DC
  Administered 2019-09-22: 650 [IU]/h via INTRAVENOUS
  Filled 2019-09-22: qty 250

## 2019-09-22 MED ORDER — ACETAMINOPHEN 650 MG RE SUPP: 650.0000 mg | Freq: Four times a day (QID) | RECTAL | Status: DC | PRN

## 2019-09-22 MED ORDER — POLYVINYL ALCOHOL 1.4 % OP SOLN: 1.0000 [drp] | Freq: Three times a day (TID) | OPHTHALMIC | Status: DC | PRN

## 2019-09-22 MED ORDER — POTASSIUM CHLORIDE CRYS ER 20 MEQ PO TBCR
40.0000 meq | EXTENDED_RELEASE_TABLET | ORAL | Status: AC
  Administered 2019-09-22: 40 meq via ORAL
  Filled 2019-09-22: qty 2

## 2019-09-22 MED ORDER — SODIUM CHLORIDE 0.9% FLUSH
3.0000 mL | Freq: Two times a day (BID) | INTRAVENOUS | Status: DC
  Administered 2019-09-22 – 2019-09-25 (×6): 3 mL via INTRAVENOUS

## 2019-09-22 MED ORDER — DILTIAZEM HCL-DEXTROSE 125-5 MG/125ML-% IV SOLN (PREMIX)
5.0000 mg/h | INTRAVENOUS | Status: DC
  Administered 2019-09-22: 5 mg/h via INTRAVENOUS
  Filled 2019-09-22: qty 125

## 2019-09-22 MED ORDER — ONDANSETRON HCL 4 MG/2ML IJ SOLN: 4.0000 mg | Freq: Four times a day (QID) | INTRAMUSCULAR | Status: DC | PRN

## 2019-09-22 MED ORDER — ONDANSETRON HCL 4 MG PO TABS: 4.0000 mg | ORAL_TABLET | Freq: Four times a day (QID) | ORAL | Status: DC | PRN

## 2019-09-22 MED ORDER — HEPARIN BOLUS VIA INFUSION
2000.0000 [IU] | Freq: Once | INTRAVENOUS | Status: AC
  Administered 2019-09-22: 2000 [IU] via INTRAVENOUS
  Filled 2019-09-22: qty 2000

## 2019-09-22 MED ORDER — CARBOXYMETHYLCELLULOSE SODIUM 0.25 % OP SOLN: 1.0000 [drp] | Freq: Three times a day (TID) | OPHTHALMIC | Status: DC | PRN

## 2019-09-22 MED ORDER — PREDNISONE 5 MG PO TABS
5.0000 mg | ORAL_TABLET | Freq: Every day | ORAL | Status: DC
  Administered 2019-09-22 – 2019-09-25 (×4): 5 mg via ORAL
  Filled 2019-09-22 (×5): qty 1

## 2019-09-22 MED ORDER — APIXABAN 2.5 MG PO TABS
2.5000 mg | ORAL_TABLET | Freq: Two times a day (BID) | ORAL | Status: DC
  Filled 2019-09-22 (×2): qty 1

## 2019-09-22 MED ORDER — DIGOXIN 0.25 MG/ML IJ SOLN
0.2500 mg | Freq: Every day | INTRAMUSCULAR | Status: AC
  Administered 2019-09-22: 0.25 mg via INTRAVENOUS
  Filled 2019-09-22: qty 1
  Filled 2019-09-22: qty 2

## 2019-09-22 MED ORDER — FINASTERIDE 5 MG PO TABS
5.0000 mg | ORAL_TABLET | Freq: Every day | ORAL | Status: DC | PRN
  Filled 2019-09-22: qty 1

## 2019-09-22 MED ORDER — APIXABAN 2.5 MG PO TABS
2.5000 mg | ORAL_TABLET | Freq: Two times a day (BID) | ORAL | Status: DC
  Administered 2019-09-22 – 2019-09-25 (×7): 2.5 mg via ORAL
  Filled 2019-09-22 (×8): qty 1

## 2019-09-22 MED ORDER — ACETAMINOPHEN 325 MG PO TABS
650.0000 mg | ORAL_TABLET | Freq: Four times a day (QID) | ORAL | Status: DC | PRN
  Administered 2019-09-22: 650 mg via ORAL

## 2019-09-22 SURGICAL SUPPLY — 14 items

## 2019-09-22 NOTE — ED Notes (Signed)
Patient given urinal and informed that we need a urine sample if possible.

## 2019-09-22 NOTE — Progress Notes (Signed)
Patient arrived for scheduled procedure, but did not stop Plavix, last dose 09/21/19 am.  HR in 150s.  Dr Therisa Doyne notified and requested patient be seen in ED.  Called daughter to relay all info, all questions answered. She will wait in our lobby.  Patient transported to ED, report given to ED nurse.

## 2019-09-22 NOTE — ED Notes (Signed)
Pt. Documented in error see above note in chart. 

## 2019-09-22 NOTE — Progress Notes (Addendum)
ANTICOAGULATION CONSULT NOTE - Initial Consult  Pharmacy Consult for IV heparin to transition to apixaban - see below for addendum Indication: atrial fibrillation  No Known Allergies  Patient Measurements:   Heparin Dosing Weight: 47.6  Vital Signs: Temp: 97.7 F (36.5 C) (01/18 0737) Temp Source: Oral (01/18 0737) BP: 98/77 (01/18 0945) Pulse Rate: 61 (01/18 1030)  Labs: Recent Labs    09/22/19 0749  HGB 9.5*  HCT 30.7*  PLT 142*  CREATININE 1.21    Estimated Creatinine Clearance: 31.7 mL/min (by C-G formula based on SCr of 1.21 mg/dL).   Medical History: Past Medical History:  Diagnosis Date  . Achalasia 06/12/2019   Noted on EGD  . Aortic insufficiency 03/24/2019   AI of bioprosthetic AVR severe 4 plus  . Chronic diastolic CHF (congestive heart failure) ([place]) 03/24/2019  . Colon polyps    s/p diverticular perforation requiring 2-stage repair  . Derangement of right shoulder joint    need replacing has no use of  . Diverticulosis   . DJD (degenerative joint disease), lumbosacral   . Dysphagia    eats soft food  . Esophageal stricture   . GERD (gastroesophageal reflux disease)   . Hemolytic anemia ([place])   . History of blood transfusion given with 04-15-19 surgery  . History of blood transfusion 07/18/2019  . History of kidney stones    passed stones  . Hypertension   . Mitral regurgitation    moderate  . Occipital neuralgia   . Pancytopenia ([place])   . Postoperative atrial fibrillation ([place]) 05/10/2015  . Prosthetic valve dysfunction   . Rheumatoid arthritis ([place])    s/o long term steroids  shoulders and hands  . Rotator cuff arthropathy    right  . S/P aortic valve replacement with bioprosthetic valve 01/16/2008   12mm Edwards Magna perimount bovine pericardial tissue valve, model 3000  . S/P valve-in-valve TAVR 04/15/2019   26 mm Edwards Sapien 3 Ultra transcatheter heart valve placed via percutaneous right transfemoral approach   . SBE (subacute  bacterial endocarditis) prophylaxis candidate    for dental procedures  . Schatzki's ring 06/12/2019   Narrowing, Noted on EGD  . Severe aortic stenosis    S/P prosthetic valve replacement w 25 mm Edwards like science percardial tissue valve,Turner - 01/2008  . Thrombocytopenia ([facility])     Medications:  Scheduled:  . folic acid  1 mg Oral Daily  . furosemide  40 mg Oral Daily  . [START ON 09/23/2019] pantoprazole  40 mg Oral QAC breakfast  . [START ON 09/23/2019] potassium chloride SA  20 mEq Oral Daily  . potassium chloride  40 mEq Oral STAT  . predniSONE  5 mg Oral Q breakfast  . sodium chloride flush  3 mL Intravenous Q12H   Infusions:  . diltiazem (CARDIZEM) infusion 15 mg/hr (09/22/19 [id number])    Assessment: 85 yo male presented to have EGD on Plavix/ASA prior to admission for hx of AVR replacement. Of note when presented for EGD, patient with HR in 150s and found to be in Afib. To start IV heparin per pharmacy dosing. Baseline labs drawn.   Goal of Therapy:  Heparin level 0.3-0.7 units/ml Monitor platelets by anticoagulation protocol: Yes   Plan:   IV heparin bolus of 2000 units then  IV heparin infusion rate of 650 units/hr  Check heparin level 8 hours after heparin started  Daily CBC  Kara Mead 09/22/2019,10:48 AM    Addendum ------------------------------------------------------------------------------------------------   A/P: to change IV heparin  to apixaban per Md order for afib. Patient meets 2 of the 3 criteria for reduced apixaban 2.5mg  BID (age>80 and weight<60kg). Stop IV heparin now and start PO apixaban simultaneously. Will counsel patient prior to discharge  Adrian Saran, PharmD, BCPS 09/22/2019 2:55 PM

## 2019-09-22 NOTE — H&P (Addendum)
History and Physical    Joshua Hudson [name] DOB: 08-06-1935 DOA: 09/22/2019  Referring MD/NP/PA: Lennice Sites, MD PCP: Lavone Orn, MD  Patient coming from: Endoscopic clinic  Chief Complaint: Palpitations  I have personally briefly reviewed patient's old medical records in [place]   HPI: Joshua Hudson is a 85 y.o. male with medical history significant of hypertension, aortic insufficiency S/P tavr, esophageal stricture, and GERD.  Patient had initially presented to endoscopy clinic to undergo esophageal stretching with Dr. Therisa Doyne, but was found to be in atrial fibrillation with RVR.  Patient reports over the last 3 to 4 days that he has not felt great.  Associated symptoms include palpitations, dizziness with standing, shortness of breath with exertion, and intermittent leg swelling.  Denies having any chest pain, loss of consciousness, nausea, vomiting, abdominal pain, diarrhea, or cough symptoms.  He has been eating a soft diet due to his esophageal stricture.  ED Course: Upon admission into the emergency department patient was seen to have heart rates into the 150s and tachypneic with blood pressures 96/78-115/83.  Chest x-ray showed heart at the upper limits of normal, but no signs of edema.  Labs significant for hemoglobin 9.5, platelet count 142, potassium 3.6, magnesium 2.2.  COVID-19 screening was -4 days ago.  Patient had been given 1 L normal saline IV fluids, 10 mg of diltiazem IV, and then started on a diltiazem drip.  TRH consulted to admit.  Review of Systems  Constitutional: Positive for malaise/fatigue. Negative for chills and fever.  HENT: Negative for congestion and nosebleeds.   Eyes: Negative for double vision.  Respiratory: Positive for shortness of breath. Negative for cough.   Cardiovascular: Positive for palpitations and leg swelling. Negative for chest pain.  Gastrointestinal: Negative for abdominal pain, nausea and vomiting.  Genitourinary:  Negative for dysuria and hematuria.  Musculoskeletal: Negative for falls.  Skin: Negative for rash.  Neurological: Positive for dizziness. Negative for loss of consciousness.  Psychiatric/Behavioral: Negative for substance abuse.    Past Medical History:  Diagnosis Date  . Achalasia 06/12/2019   Noted on EGD  . Aortic insufficiency 03/24/2019   AI of bioprosthetic AVR severe 4 plus  . Chronic diastolic CHF (congestive heart failure) ([place]) 03/24/2019  . Colon polyps    s/p diverticular perforation requiring 2-stage repair  . Derangement of right shoulder joint    need replacing has no use of  . Diverticulosis   . DJD (degenerative joint disease), lumbosacral   . Dysphagia    eats soft food  . Esophageal stricture   . GERD (gastroesophageal reflux disease)   . Hemolytic anemia ([place])   . History of blood transfusion given with 04-15-19 surgery  . History of blood transfusion 07/18/2019  . History of kidney stones    passed stones  . Hypertension   . Mitral regurgitation    moderate  . Occipital neuralgia   . Pancytopenia ([place])   . Postoperative atrial fibrillation ([place]) 05/10/2015  . Prosthetic valve dysfunction   . Rheumatoid arthritis ([place])    s/o long term steroids  shoulders and hands  . Rotator cuff arthropathy    right  . S/P aortic valve replacement with bioprosthetic valve 01/16/2008   75mm Edwards Magna perimount bovine pericardial tissue valve, model 3000  . S/P valve-in-valve TAVR 04/15/2019   26 mm Edwards Sapien 3 Ultra transcatheter heart valve placed via percutaneous right transfemoral approach   . SBE (subacute bacterial endocarditis) prophylaxis candidate    for  dental procedures  . Schatzki's ring 06/12/2019   Narrowing, Noted on EGD  . Severe aortic stenosis    S/P prosthetic valve replacement w 25 mm Edwards like science percardial tissue valve,Turner - 01/2008  . Thrombocytopenia ([place])     Past Surgical History:  Procedure Laterality Date  .  APPENDECTOMY    . BALLOON DILATION N/A 06/12/2019   Procedure: BALLOON DILATION;  Surgeon: Ronnette Juniper, MD;  Location: [facility];  Service: Gastroenterology;  Laterality: N/A;  . BOTOX INJECTION N/A 01/22/2018   Procedure: BOTOX INJECTION;  Surgeon: Ronnette Juniper, MD;  Location: [facility];  Service: Gastroenterology;  Laterality: N/A;  . CARDIAC CATHETERIZATION     09  . CARDIAC VALVE REPLACEMENT  01/2008   aortic valve replacement  . CATARACT EXTRACTION W/ INTRAOCULAR LENS  IMPLANT, BILATERAL    . COLON RESECTION     diverticulitis   . CORONARY ANGIOPLASTY    . dental implants     permanent  . ESOPHAGEAL MANOMETRY N/A 11/07/2017   Procedure: ESOPHAGEAL MANOMETRY (EM);  Surgeon: Ronnette Juniper, MD;  Location: [facility];  Service: Gastroenterology;  Laterality: N/A;  . ESOPHAGOGASTRODUODENOSCOPY (EGD) WITH PROPOFOL N/A 01/22/2018   Procedure: ESOPHAGOGASTRODUODENOSCOPY (EGD) WITH PROPOFOL;  Surgeon: Ronnette Juniper, MD;  [facility]: [facility];  Service: Gastroenterology;  Laterality: N/A;  . ESOPHAGOGASTRODUODENOSCOPY (EGD) WITH PROPOFOL N/A 04/30/2019   Procedure: ESOPHAGOGASTRODUODENOSCOPY (EGD) WITH PROPOFOL;  Surgeon: Laurence Spates, MD;  Location: [facility];  Service: Endoscopy;  Laterality: N/A;  . ESOPHAGOGASTRODUODENOSCOPY (EGD) WITH PROPOFOL N/A 06/12/2019   Procedure: ESOPHAGOGASTRODUODENOSCOPY (EGD) WITH PROPOFOL;  Surgeon: Ronnette Juniper, MD;  [facility];  Service: Gastroenterology;  Laterality: N/A;  with botox injection  . EXCISIONAL TOTAL SHOULDER ARTHROPLASTY WITH ANTIBIOTIC SPACER Right 12/31/2018   Procedure: EXCISIONAL TOTAL SHOULDER ARTHROPLASTY WITH ANTIBIOTIC SPACER;  Surgeon: Netta Cedars, MD;  Location: [facility];  Service: Orthopedics;  Laterality: Right;  . HERNIA REPAIR    . INTRAOPERATIVE TRANSTHORACIC ECHOCARDIOGRAM N/A 04/15/2019   Procedure: Intraoperative Transthoracic Echocardiogram;  Surgeon: Sherren Mocha, MD;  Location: [facility];  Service: Open Heart  Surgery;  Laterality: N/A;  . IRRIGATION AND DEBRIDEMENT SHOULDER Right 11/20/2018    IRRIGATION AND DEBRIDEMENT SHOULDER WITH POLY EXCHANGE (Right Shoulder)  . IRRIGATION AND DEBRIDEMENT SHOULDER Right 11/20/2018   Procedure: IRRIGATION AND DEBRIDEMENT SHOULDER WITH POLY EXCHANGE;  Surgeon: Netta Cedars, MD;  Location: [facility];  Service: Orthopedics;  Laterality: Right;  . LUMBAR LAMINECTOMY     x 2  . REVERSE SHOULDER ARTHROPLASTY Right 03/01/2018   Procedure: RIGHT REVERSE SHOULDER ARTHROPLASTY;  Surgeon: Netta Cedars, MD;  Location: [facility];  Service: Orthopedics;  Laterality: Right;  . REVERSE SHOULDER ARTHROPLASTY Right 07/18/2019   Procedure: REVERSE TOTAL SHOULDER ARTHROPLASTY and removal of antiobotic spacer;  Surgeon: Netta Cedars, MD;  Location: [facility];  Service: Orthopedics;  Laterality: Right;  interscalene block  . REVERSE SHOULDER ARTHROPLASTY Right 08/06/2019   Procedure: Reduction of dislocated reverse total shoulder and poly exchange;  Surgeon: Netta Cedars, MD;  Location: [facility];  Service: Orthopedics;  Laterality: Right;  need 1 hour  . RIGHT/LEFT HEART CATH AND CORONARY ANGIOGRAPHY N/A 04/02/2019   Procedure: RIGHT/LEFT HEART CATH AND CORONARY ANGIOGRAPHY;  Surgeon: Leonie Man, MD;  Location: [facility];  Service: Cardiovascular;  Laterality: N/A;  . SAVORY DILATION N/A 01/22/2018   Procedure: SAVORY DILATION;  Surgeon: Ronnette Juniper, MD;  [facility];  Service: Gastroenterology;  Laterality: N/A;  . SAVORY DILATION N/A 04/30/2019   Procedure:  SAVORY DILATION;  Surgeon: Laurence Spates, MD;  Location: [facility];  Service: Endoscopy;  Laterality: N/A;  With fluro  . SHOULDER HEMI-ARTHROPLASTY Right 06/14/2018   Procedure: RIGHT  REVERSE TOTAL SHOULDER OPEN POLY EXCHANGE;  Surgeon: Netta Cedars, MD;  Location: [facility];  Service: Orthopedics;  Laterality: Right;  . SUBMUCOSAL INJECTION  06/12/2019   Procedure: SUBMUCOSAL INJECTION;  Surgeon: Ronnette Juniper, MD;   Location: [facility];  Service: Gastroenterology;;  . TEE WITHOUT CARDIOVERSION N/A 01/29/2014   Procedure: TRANSESOPHAGEAL ECHOCARDIOGRAM (TEE);  Surgeon: Sueanne Margarita, MD;  Location: [facility];  Service: Cardiovascular;  Laterality: N/A;  . TEE WITHOUT CARDIOVERSION N/A 04/02/2019   Procedure: TRANSESOPHAGEAL ECHOCARDIOGRAM (TEE);  Surgeon: Josue Hector, MD;  Location: [facility]COPY;  Service: Cardiovascular;  Laterality: N/A;  . TONSILLECTOMY    . TRANSCATHETER AORTIC VALVE REPLACEMENT, TRANSFEMORAL  04/15/2019  . TRANSCATHETER AORTIC VALVE REPLACEMENT, TRANSFEMORAL N/A 04/15/2019   Procedure: TRANSCATHETER AORTIC VALVE REPLACEMENT, TRANSFEMORAL with POST BALLOON DILATION;  Surgeon: Sherren Mocha, MD;  Location: [facility];  Service: Open Heart Surgery;  Laterality: N/A;     reports that he quit smoking about 36 years ago. His smoking use included cigarettes. He started smoking about 45 years ago. He has a 5.00 pack-year smoking history. He has quit using smokeless tobacco.  His smokeless tobacco use included chew. He reports previous alcohol use. He reports that he does not use drugs.  No Known Allergies  Family History  Problem Relation Age of Onset  . Other Mother        NO HEALTH PROBLEMS  . Heart disease Father   . Arthritis Father   . Heart Problems Sister        RELATED TO A MVA  . Suicidality Brother   . Other Sister        [place]  . Other Brother        2 [place]  . Other Daughter        2 [place]    Prior to Admission medications   Medication Sig Start Date End Date Taking? Authorizing Provider  aspirin EC 81 MG EC tablet Take 1 tablet (81 mg total) by mouth daily. 01/02/14  Yes Sueanne Margarita, MD  clopidogrel (PLAVIX) 75 MG tablet Take 1 tablet (75 mg total) by mouth daily with breakfast. 08/06/19  Yes Turner, Eber Hong, MD  finasteride (PROSCAR) 5 MG tablet Take 5 mg by mouth daily as needed (retention).  06/26/19  Yes  [provider]  folic acid (FOLVITE) 1 MG tablet Take 1 mg by mouth daily.   Yes [provider]  furosemide (LASIX) 40 MG tablet Take 40 mg daily. Patient taking differently: Take 40 mg by mouth daily.  04/16/19  Yes Eileen Stanford, PA-C  metoprolol succinate (TOPROL-XL) 25 MG 24 hr tablet Take 1 tablet (25 mg total) by mouth daily. 08/06/19  Yes Turner, Eber Hong, MD  Multiple Vitamin (MULTIVITAMIN WITH MINERALS) TABS tablet Take 1 tablet by mouth daily.   Yes [provider]  oxymetazoline (AFRIN) 0.05 % nasal spray Place 1 spray into both nostrils 2 (two) times daily as needed for congestion.   Yes [provider]  pantoprazole (PROTONIX) 40 MG tablet Take 40 mg by mouth daily before breakfast.    Yes [provider]  potassium chloride SA (K-DUR) 20 MEQ tablet Take 20 meq daily Patient taking differently: Take 20 mEq by mouth daily.  04/16/19  Yes Eileen Stanford, PA-C  predniSONE (DELTASONE) 5 MG tablet Take 5 mg by mouth daily with breakfast.    Yes [provider]  THERATEARS 0.25 % SOLN Place 1 drop into both eyes 3 (three) times daily as needed (dry/irritated eyes.).   Yes [provider]  ALPRAZolam (XANAX) 0.25 MG tablet Take 1 tablet (0.25 mg total) by mouth at bedtime as needed for anxiety or sleep. Patient not taking: Reported on 09/17/2019 05/15/19   Eileen Stanford, PA-C  feeding supplement, ENSURE ENLIVE, (ENSURE ENLIVE) LIQD Take 237 mLs by mouth 3 (three) times daily between meals. Patient not taking: Reported on 09/17/2019 05/02/19   Modena Jansky, MD  HYDROcodone-acetaminophen (NORCO) 5-325 MG tablet Take 1 tablet by mouth every 6 (six) hours as needed for moderate pain or severe pain. Patient not taking: Reported on 09/17/2019 07/18/19   Netta Cedars, MD    Physical Exam:  Constitutional: Elderly male who appears to be in no acute distress at this time Vitals:   09/22/19 0845 09/22/19 0900 09/22/19  0915 09/22/19 0930  BP: 115/83 111/79 102/78 97/75  Pulse: (!) 152 (!) 153 (!) 152 (!) 154  Resp: 10 (!) 28 (!) 23 (!) 29  Temp:      TempSrc:      SpO2: 100% 99% 99% 100%   Eyes: PERRL, lids and conjunctivae normal ENMT: Mucous membranes are moist. Posterior pharynx clear of any exudate or lesions.  Neck: normal, supple, no masses, no thyromegaly Respiratory: clear to auscultation bilaterally, no wheezing, no crackles. Normal respiratory effort. No accessory muscle use.  Cardiovascular: Irregularly irregular. No extremity edema. 2+ pedal pulses. No carotid bruits.  Abdomen: no tenderness, no masses palpated. No hepatosplenomegaly. Bowel sounds positive.  Musculoskeletal: no clubbing / cyanosis. No joint deformity upper and lower extremities. Good ROM, no contractures. Normal muscle tone.  Skin: no rashes, lesions, ulcers. No induration Neurologic: CN 2-12 grossly intact. Sensation intact, DTR normal. Strength 5/5 in all 4.  Psychiatric: Normal judgment and insight. Alert and oriented x 3. Normal mood.     Labs on Admission: I have personally reviewed following labs and imaging studies  CBC: Recent Labs  Lab 09/22/19 0749  WBC 6.5  NEUTROABS 4.1  HGB 9.5*  HCT 30.7*  MCV 72.2*  PLT [id number]*   Basic Metabolic Panel: Recent Labs  Lab 09/22/19 0749  NA 141  K 3.6  CL 104  CO2 29  GLUCOSE 89  BUN 19  CREATININE 1.21  CALCIUM 9.0  MG 2.2   GFR: Estimated Creatinine Clearance: 31.7 mL/min (by C-G formula based on SCr of 1.21 mg/dL). Liver Function Tests: Recent Labs  Lab 09/22/19 0749  AST 27  ALT 17  ALKPHOS 64  BILITOT 1.0  PROT 6.0*  ALBUMIN 3.7   No results for input(s): LIPASE, AMYLASE in the last 168 hours. No results for input(s): AMMONIA in the last 168 hours. Coagulation Profile: No results for input(s): INR, PROTIME in the last 168 hours. Cardiac Enzymes: No results for input(s): CKTOTAL, CKMB, CKMBINDEX, TROPONINI in the last 168 hours. BNP (last 3  results) Recent Labs    03/18/19 1542 04/23/19 1432  PROBNP 3,511* 1,582*   HbA1C: No results for input(s): HGBA1C in the last 72 hours. CBG: No results for input(s): GLUCAP in the last 168 hours. Lipid Profile: No results for input(s): CHOL, HDL, LDLCALC, TRIG, CHOLHDL, LDLDIRECT in the last 72 hours. Thyroid Function Tests: No results for input(s): TSH, T4TOTAL, FREET4, T3FREE, THYROIDAB in  the last 72 hours. Anemia Panel: No results for input(s): VITAMINB12, FOLATE, FERRITIN, TIBC, IRON, RETICCTPCT in the last 72 hours. Urine analysis:    Component Value Date/Time   COLORURINE YELLOW 05/31/2019 0943   APPEARANCEUR CLEAR 05/31/2019 0943   LABSPEC 1.004 (L) 05/31/2019 0943   PHURINE 7.0 05/31/2019 0943   GLUCOSEU NEGATIVE 05/31/2019 0943   HGBUR MODERATE (A) 05/31/2019 0943   BILIRUBINUR NEGATIVE 05/31/2019 0943   KETONESUR NEGATIVE 05/31/2019 0943   PROTEINUR NEGATIVE 05/31/2019 0943   UROBILINOGEN 0.2 01/14/2008 0530   NITRITE NEGATIVE 05/31/2019 0943   LEUKOCYTESUR NEGATIVE 05/31/2019 0943   Sepsis Labs: Recent Results (from the past 240 hour(s))  Novel Coronavirus, NAA (Hosp order, Send-out to Ref Lab; TAT 18-24 hrs     Status: None   Collection Time: 09/18/19  9:45 AM   Specimen: Nasopharyngeal Swab; Respiratory  Result Value Ref Range Status   SARS-CoV-2, NAA NOT DETECTED NOT DETECTED Final    Comment: (NOTE) This nucleic acid amplification test was developed and its performance characteristics determined by [employer]. Nucleic acid amplification tests include PCR and TMA. This test has not been FDA cleared or approved. This test has been authorized by FDA under an Emergency Use Authorization (EUA). This test is only authorized for the duration of time the declaration that circumstances exist justifying the authorization of the emergency use of in vitro diagnostic tests for detection of SARS-CoV-2 virus and/or diagnosis of COVID-19 infection under  section 564(b)(1) of the Act, 21 U.S.C. [id number]) (1), unless the authorization is terminated or revoked sooner. When diagnostic testing is negative, the possibility of a false negative result should be considered in the context of a patient's recent exposures and the presence of clinical signs and symptoms consistent with COVID-19. An individual without symptoms of COVID- 19 and who is not shedding SARS-CoV-2 vi rus would expect to have a negative (not detected) result in this assay. Performed At: [facility] [address] [place], [place] [id number] Rush Farmer MD [id number]    [place]  Final    Comment: Performed at [facility], [place] [address], [place], [place] [address]     Radiological Exams on Admission: DG Chest Portable 1 View  Result Date: 09/22/2019 CLINICAL DATA:  New atrial fibrillation. EXAM: PORTABLE CHEST 1 VIEW COMPARISON:  Chest radiograph 04/28/2019 FINDINGS: Heart size at the upper limits of normal. Status post TAVR and prior median sternotomy. Aortic atherosclerosis. There is no airspace consolidation. No evidence of pleural effusion or pneumothorax. No acute bony abnormality. Right shoulder prosthesis. Overlying cardiac monitoring leads. IMPRESSION: Heart size at the upper limits of normal. Status post TAVR and median sternotomy. No airspace consolidation or pulmonary edema. Aortic atherosclerosis. Electronically Signed   By: Kellie Simmering DO   On: 09/22/2019 08:13    EKG: Independently reviewed.  Atrial fibrillation at 152 bpm with RVR  Assessment/Plan Atrial fibrillation with RVR: Acute.  Patient presents in atrial fibrillation with heart rates into the 150s.  Patient was given 10 mg of diltiazem and then started on a diltiazem drip.  Records noted prior history of postoperative atrial fibrillation but he was not on formal anticoagulation. CHA2DS2-VASc score = 5. -Admit to a progressive bed -Focused atrial  fibrillation order set utilized -Goal magnesium 2 and potassium 4 -Check TSH -Check echocardiogram -Diltiazem drip  -Heparin drip per pharmacy -Cardiology consulted, will follow-up for further recommendation in regards to anticoagulation as patient on aspirin and Plavix currently  Systolic congestive heart failure  exacerbation: Acute.  Patient reports having lower extremity swelling and progressively worsening shortness of breath with exertion.  BNP mildly elevated at 175.3.  Suspect aspect of patient being fluid overloaded. -Strict intake and output -Daily weights -Follow-up echocardiogram -Furosemide 20 mg IV twice daily due to low blood pressure  Elevated troponin: Mildly elevated troponin of 29 and subsequently 38.  Patient denies any complaints of chest pain.  Likely secondary to demand ischemia.  Essential hypertension: Blood pressures currently stable. -Continue furosemide  Microcytic hypochromic anemia: Chronic.  Hemoglobin 9.5 on admission which appears improved from previous.  Last iron studies on 04/29/2019 noted iron, TIBC 160, and ferritin 1771. -Continue to monitor  S/p TAVR: Home medications including aspirin and Plavix. -On hold while on heparin drip,  will follow-up with cardiology for further recommendations  Thrombocytopenia: Chronic.  Platelet count 142 on admission which appears similar to previous. -Continue to monitor  BPH -Continue finasteride  GERD, esophageal stricture: Patient had been scheduled to undergo esophageal stretching with Dr. Therisa Doyne today. -Soft diet -Continue outpatient follow-up with Dr. Therisa Doyne  DVT prophylaxis: Lovenox Code Status: Full Family Communication: Discussed plan of care with the patient's daughter over the phone Disposition Plan: Possible discharge home in 1 to 2 days. Consults called: Cardiology Admission status: Observation  Norval Morton MD [facility] Pager [phone]   If 7PM-7AM, please contact  night-coverage [email or web address] Password [facility]  09/22/2019, 10:07 AM

## 2019-09-22 NOTE — ED Notes (Signed)
HR 150s after 30 minutes of cardizem drip at 5/hr. Patient is now on 10/hr.

## 2019-09-22 NOTE — ED Provider Notes (Signed)
Joshua Hudson DEPT Provider Note   CSN: [id number] Arrival date & time: 09/22/19  0732     History Chief Complaint  Patient presents with  . Tachycardia    Joshua Hudson is a 85 y.o. male.  Patient arrives from endoscopy clinic where he was due to have an EGD and found to be in atrial fibrillation with RVR.  Was sent for evaluation.  No history of this.  Patient states that he has felt rundown for the last several days.  Has not particularly noticed any palpitations or chest pain.  No shortness of breath, no abdominal pain.  Has a history of esophageal strictures.  The history is provided by the patient.  Illness Location:  Genera Severity:  Mild Onset quality:  Unable to specify Timing:  Unable to specify Progression:  Unable to specify Chronicity:  New Associated symptoms: fatigue   Associated symptoms: no abdominal pain, no chest pain, no cough, no ear pain, no fever, no rash, no shortness of breath, no sore throat and no vomiting        Past Medical History:  Diagnosis Date  . Achalasia 06/12/2019   Noted on EGD  . Aortic insufficiency 03/24/2019   AI of bioprosthetic AVR severe 4 plus  . Chronic diastolic CHF (congestive heart failure) ([place]) 03/24/2019  . Colon polyps    s/p diverticular perforation requiring 2-stage repair  . Derangement of right shoulder joint    need replacing has no use of  . Diverticulosis   . DJD (degenerative joint disease), lumbosacral   . Dysphagia    eats soft food  . Esophageal stricture   . GERD (gastroesophageal reflux disease)   . Hemolytic anemia ([place])   . History of blood transfusion given with 04-15-19 surgery  . History of blood transfusion 07/18/2019  . History of kidney stones    passed stones  . Hypertension   . Mitral regurgitation    moderate  . Occipital neuralgia   . Pancytopenia ([place])   . Postoperative atrial fibrillation ([place]) 05/10/2015  . Prosthetic valve dysfunction   .  Rheumatoid arthritis ([place])    s/o long term steroids  shoulders and hands  . Rotator cuff arthropathy    right  . S/P aortic valve replacement with bioprosthetic valve 01/16/2008   67mm Edwards Magna perimount bovine pericardial tissue valve, model 3000  . S/P valve-in-valve TAVR 04/15/2019   26 mm Edwards Sapien 3 Ultra transcatheter heart valve placed via percutaneous right transfemoral approach   . SBE (subacute bacterial endocarditis) prophylaxis candidate    for dental procedures  . Schatzki's ring 06/12/2019   Narrowing, Noted on EGD  . Severe aortic stenosis    S/P prosthetic valve replacement w 25 mm Edwards like science percardial tissue valve,[place] - 01/2008  . Thrombocytopenia [facility])     Patient Active Problem List   Diagnosis Date Noted  . S/P shoulder replacement, right 07/18/2019  . Thrombocytopenia ([place]) 05/20/2019  . Other pancytopenia ([place]) 05/06/2019  . Protein-calorie malnutrition, severe 04/30/2019  . FTT (failure to thrive) in adult 04/28/2019  . GERD (gastroesophageal reflux disease)   . Esophageal stricture   . History of esophageal stricture   . Odynophagia   . Hypertension   . Rheumatoid arthritis ([place])   . S/P valve-in-valve TAVR   . Prosthetic valve dysfunction   . Severe aortic regurgitation 04/02/2019  . Mitral regurgitation 05/16/2016  . Postoperative atrial fibrillation ([place]) 05/10/2015  . Hemolytic anemia ([place]) 02/25/2014  . Acute  on chronic diastolic heart failure ([place]) 02/25/2014  . Dysphagia 02/25/2014  . S/P aortic valve replacement with bioprosthetic valve 01/16/2008    Past Surgical History:  Procedure Laterality Date  . APPENDECTOMY    . BALLOON DILATION N/A 06/12/2019   Procedure: BALLOON DILATION;  Surgeon: Ronnette Juniper, MD;  Location: [facility];  Service: Gastroenterology;  Laterality: N/A;  . BOTOX INJECTION N/A 01/22/2018   Procedure: BOTOX INJECTION;  Surgeon: Ronnette Juniper, MD;  Location: [facility];  Service:  Gastroenterology;  Laterality: N/A;  . CARDIAC CATHETERIZATION     09  . CARDIAC VALVE REPLACEMENT  01/2008   aortic valve replacement  . CATARACT EXTRACTION W/ INTRAOCULAR LENS  IMPLANT, BILATERAL    . COLON RESECTION     diverticulitis   . CORONARY ANGIOPLASTY    . dental implants     permanent  . ESOPHAGEAL MANOMETRY N/A 11/07/2017   Procedure: ESOPHAGEAL MANOMETRY (EM);  Surgeon: Ronnette Juniper, MD;  [facility]: [facility];  Service: Gastroenterology;  Laterality: N/A;  . ESOPHAGOGASTRODUODENOSCOPY (EGD) WITH PROPOFOL N/A 01/22/2018   Procedure: ESOPHAGOGASTRODUODENOSCOPY (EGD) WITH PROPOFOL;  Surgeon: Ronnette Juniper, MD;  [facility];  Service: Gastroenterology;  Laterality: N/A;  . ESOPHAGOGASTRODUODENOSCOPY (EGD) WITH PROPOFOL N/A 04/30/2019   Procedure: ESOPHAGOGASTRODUODENOSCOPY (EGD) WITH PROPOFOL;  Surgeon: Laurence Spates, MD;  Location: [place];  Service: Endoscopy;  Laterality: N/A;  . ESOPHAGOGASTRODUODENOSCOPY (EGD) WITH PROPOFOL N/A 06/12/2019   Procedure: ESOPHAGOGASTRODUODENOSCOPY (EGD) WITH PROPOFOL;  Surgeon: Ronnette Juniper, MD;  [facility];  Service: Gastroenterology;  Laterality: N/A;  with botox injection  . EXCISIONAL TOTAL SHOULDER ARTHROPLASTY WITH ANTIBIOTIC SPACER Right 12/31/2018   Procedure: EXCISIONAL TOTAL SHOULDER ARTHROPLASTY WITH ANTIBIOTIC SPACER;  Surgeon: Netta Cedars, MD;  Location: [place];  Service: Orthopedics;  Laterality: Right;  . HERNIA REPAIR    . INTRAOPERATIVE TRANSTHORACIC ECHOCARDIOGRAM N/A 04/15/2019   Procedure: Intraoperative Transthoracic Echocardiogram;  Surgeon: Sherren Mocha, MD;  Location: [place];  Service: Open Heart Surgery;  Laterality: N/A;  . IRRIGATION AND DEBRIDEMENT SHOULDER Right 11/20/2018    IRRIGATION AND DEBRIDEMENT SHOULDER WITH POLY EXCHANGE (Right Shoulder)  . IRRIGATION AND DEBRIDEMENT SHOULDER Right 11/20/2018   Procedure: IRRIGATION AND DEBRIDEMENT SHOULDER WITH POLY EXCHANGE;  Surgeon: Netta Cedars,  MD;  Location: [place];  Service: Orthopedics;  Laterality: Right;  . LUMBAR LAMINECTOMY     x 2  . REVERSE SHOULDER ARTHROPLASTY Right 03/01/2018   Procedure: RIGHT REVERSE SHOULDER ARTHROPLASTY;  Surgeon: Netta Cedars, MD;  Location: [place];  Service: Orthopedics;  Laterality: Right;  . REVERSE SHOULDER ARTHROPLASTY Right 07/18/2019   Procedure: REVERSE TOTAL SHOULDER ARTHROPLASTY and removal of antiobotic spacer;  Surgeon: Netta Cedars, MD;  [facility];  Service: Orthopedics;  Laterality: Right;  interscalene block  . REVERSE SHOULDER ARTHROPLASTY Right 08/06/2019   Procedure: Reduction of dislocated reverse total shoulder and poly exchange;  Surgeon: Netta Cedars, MD;  [facility];  Service: Orthopedics;  Laterality: Right;  need 1 hour  . RIGHT/LEFT HEART CATH AND CORONARY ANGIOGRAPHY N/A 04/02/2019   Procedure: RIGHT/LEFT HEART CATH AND CORONARY ANGIOGRAPHY;  Surgeon: Leonie Man, MD;  Location: [place] [facility];  Service: Cardiovascular;  Laterality: N/A;  . SAVORY DILATION N/A 01/22/2018   Procedure: SAVORY DILATION;  Surgeon: Ronnette Juniper, MD;  [facility];  Service: Gastroenterology;  Laterality: N/A;  . SAVORY DILATION N/A 04/30/2019   Procedure: SAVORY DILATION;  Surgeon: Laurence Spates, MD;  Location: [place];  Service: Endoscopy;  Laterality: N/A;  With fluro  .  SHOULDER HEMI-ARTHROPLASTY Right 06/14/2018   Procedure: RIGHT  REVERSE TOTAL SHOULDER OPEN POLY EXCHANGE;  Surgeon: Netta Cedars, MD;  Location: [place];  Service: Orthopedics;  Laterality: Right;  . SUBMUCOSAL INJECTION  06/12/2019   Procedure: SUBMUCOSAL INJECTION;  Surgeon: Ronnette Juniper, MD;  [facility];  Service: Gastroenterology;;  . TEE WITHOUT CARDIOVERSION N/A 01/29/2014   Procedure: TRANSESOPHAGEAL ECHOCARDIOGRAM (TEE);  Surgeon: Sueanne Margarita, MD;  Location[facility];  Service: Cardiovascular;  Laterality: N/A;  . TEE WITHOUT CARDIOVERSION N/A 04/02/2019   Procedure:  TRANSESOPHAGEAL ECHOCARDIOGRAM (TEE);  Surgeon: Josue Hector, MD;  Location: [facility] ENDOSCOPY;  Service: Cardiovascular;  Laterality: N/A;  . TONSILLECTOMY    . TRANSCATHETER AORTIC VALVE REPLACEMENT, TRANSFEMORAL  04/15/2019  . TRANSCATHETER AORTIC VALVE REPLACEMENT, TRANSFEMORAL N/A 04/15/2019   Procedure: TRANSCATHETER AORTIC VALVE REPLACEMENT, TRANSFEMORAL with POST BALLOON DILATION;  Surgeon: Sherren Mocha, MD;  Location: [place];  Service: Open Heart Surgery;  Laterality: N/A;       Family History  Problem Relation Age of Onset  . Other Mother        NO HEALTH PROBLEMS  . Heart disease Father   . Arthritis Father   . Heart Problems Sister        RELATED TO A MVA  . Suicidality Brother   . Other Sister        [place]  . Other Brother        2 [place]ilia  . Other Daughter        2 [place]    Social History   Tobacco Use  . Smoking status: Former Smoker    Packs/day: 0.50    Years: 10.00    Pack years: 5.00    Types: Cigarettes    Start date: 01/16/1974    Quit date: 09/05/1983    Years since quitting: 36.0  . Smokeless tobacco: Former Systems developer    Types: Chew  Substance Use Topics  . Alcohol use: Not Currently    Alcohol/week: 0.0 standard drinks  . Drug use: No    Home Medications Prior to Admission medications   Medication Sig Start Date End Date Taking? Authorizing Provider  ALPRAZolam (XANAX) 0.25 MG tablet Take 1 tablet (0.25 mg total) by mouth at bedtime as needed for anxiety or sleep. Patient not taking: Reported on 09/17/2019 05/15/19   Eileen Stanford, PA-C  aspirin EC 81 MG EC tablet Take 1 tablet (81 mg total) by mouth daily. 01/02/14   Sueanne Margarita, MD  clopidogrel (PLAVIX) 75 MG tablet Take 1 tablet (75 mg total) by mouth daily with breakfast. 08/06/19   Sueanne Margarita, MD  feeding supplement, ENSURE ENLIVE, (ENSURE ENLIVE) LIQD Take 237 mLs by mouth 3 (three) times daily between meals. Patient not taking: Reported on  09/17/2019 05/02/19   Modena Jansky, MD  finasteride (PROSCAR) 5 MG tablet Take 5 mg by mouth daily. 06/26/19   [provider]  folic acid (FOLVITE) 1 MG tablet Take 1 mg by mouth daily.    [provider]  furosemide (LASIX) 40 MG tablet Take 40 mg daily. Patient taking differently: Take 40 mg by mouth daily.  04/16/19   Eileen Stanford, PA-C  HYDROcodone-acetaminophen (NORCO) 5-325 MG tablet Take 1 tablet by mouth every 6 (six) hours as needed for moderate pain or severe pain. Patient not taking: Reported on 09/17/2019 07/18/19   Netta Cedars, MD  metoprolol succinate (TOPROL-XL) 25 MG 24  hr tablet Take 1 tablet (25 mg total) by mouth daily. 08/06/19   Sueanne Margarita, MD  pantoprazole (PROTONIX) 40 MG tablet Take 40 mg by mouth daily before breakfast.     [provider]  potassium chloride SA (K-DUR) 20 MEQ tablet Take 20 meq daily Patient taking differently: Take 20 mEq by mouth daily.  04/16/19   Eileen Stanford, PA-C  predniSONE (DELTASONE) 5 MG tablet Take 5 mg by mouth daily with breakfast.     [provider]  THERATEARS 0.25 % SOLN Place 1 drop into both eyes 3 (three) times daily as needed (dry/irritated eyes.).    [provider]    Allergies    Patient has no known allergies.  Review of Systems   Review of Systems  Constitutional: Positive for fatigue. Negative for chills and fever.  HENT: Negative for ear pain and sore throat.   Eyes: Negative for pain and visual disturbance.  Respiratory: Negative for cough and shortness of breath.   Cardiovascular: Negative for chest pain and palpitations.  Gastrointestinal: Negative for abdominal pain and vomiting.  Genitourinary: Negative for dysuria and hematuria.  Musculoskeletal: Negative for arthralgias and back pain.  Skin: Negative for color change and rash.  Neurological: Negative for seizures and syncope.  All other systems reviewed and are negative.   Physical Exam  Updated Vital Signs  ED Triage Vitals  Enc Vitals Group     BP 09/22/19 0737 98/71     Pulse Rate 09/22/19 0737 (!) 152     Resp 09/22/19 0737 18     Temp 09/22/19 0737 97.7 F (36.5 C)     Temp Source 09/22/19 0737 Oral     SpO2 09/22/19 0737 100 %     Weight --      Height --      Head Circumference --      Peak Flow --      Pain Score 09/22/19 0739 0     Pain Loc --      Pain Edu? --      Excl. in [place]? --     Physical Exam Vitals and nursing note reviewed.  Constitutional:      General: He is not in acute distress.    Appearance: He is well-developed. He is not ill-appearing.  HENT:     Head: Normocephalic and atraumatic.     Nose: Nose normal.     Mouth/Throat:     Mouth: Mucous membranes are moist.  Eyes:     Extraocular Movements: Extraocular movements intact.     Conjunctiva/sclera: Conjunctivae normal.     Pupils: Pupils are equal, round, and reactive to light.  Cardiovascular:     Rate and Rhythm: Tachycardia present. Rhythm irregular.     Heart sounds: No murmur.  Pulmonary:     Effort: Pulmonary effort is normal. No respiratory distress.     Breath sounds: Normal breath sounds.  Abdominal:     Palpations: Abdomen is soft.     Tenderness: There is no abdominal tenderness.  Musculoskeletal:     Cervical back: Neck supple.  Skin:    General: Skin is warm and dry.     Capillary Refill: Capillary refill takes less than 2 seconds.  Neurological:     General: No focal deficit present.     Mental Status: He is alert.  Psychiatric:        Mood and Affect: Mood normal.     ED Results / Procedures /  Treatments   Labs (all labs ordered are listed, but only abnormal results are displayed) Labs Reviewed  CBC WITH DIFFERENTIAL/PLATELET - Abnormal; Notable for the following components:      Result Value   Hemoglobin 9.5 (*)    HCT 30.7 (*)    MCV 72.2 (*)    MCH 22.4 (*)    RDW 17.4 (*)    Platelets 142 (*)    nRBC 0.5 (*)    All other components  within normal limits  BASIC METABOLIC PANEL - Abnormal; Notable for the following components:   GFR calc non Af Amer 55 (*)    All other components within normal limits  HEPATIC FUNCTION PANEL - Abnormal; Notable for the following components:   Total Protein 6.0 (*)    All other components within normal limits  RESPIRATORY PANEL BY RT PCR (FLU A&B, COVID)  MAGNESIUM  URINALYSIS, ROUTINE W REFLEX MICROSCOPIC    EKG EKG Interpretation  Date/Time:  Monday September 22 2019 07:41:00 EST Ventricular Rate:  152 PR Interval:    QRS Duration: 104 QT Interval:  286 QTC Calculation: 455 R Axis:   -101 Text Interpretation: Atrial fibrillation with rapid V-rate Ventricular premature complex LAD, consider left anterior fascicular block Abnormal R-wave progression, late transition Confirmed by Lennice Sites [phone]) on 09/22/2019 7:46:56 AM   Radiology DG Chest Portable 1 View  Result Date: 09/22/2019 CLINICAL DATA:  New atrial fibrillation. EXAM: PORTABLE CHEST 1 VIEW COMPARISON:  Chest radiograph 04/28/2019 FINDINGS: Heart size at the upper limits of normal. Status post TAVR and prior median sternotomy. Aortic atherosclerosis. There is no airspace consolidation. No evidence of pleural effusion or pneumothorax. No acute bony abnormality. Right shoulder prosthesis. Overlying cardiac monitoring leads. IMPRESSION: Heart size at the upper limits of normal. Status post TAVR and median sternotomy. No airspace consolidation or pulmonary edema. Aortic atherosclerosis. Electronically Signed   By: Kellie Simmering DO   On: 09/22/2019 08:13    Procedures .Critical Care Performed by: Lennice Sites, DO Authorized by: Lennice Sites, DO   Critical care provider statement:    Critical care time (minutes):  45   Critical care was necessary to treat or prevent imminent or life-threatening deterioration of the following conditions:  Cardiac failure   Critical care was time spent personally by me on the following  activities:  Blood draw for specimens, development of treatment plan with patient or surrogate, discussions with primary provider, evaluation of patient's response to treatment, examination of patient, obtaining history from patient or surrogate, ordering and performing treatments and interventions, ordering and review of laboratory studies, ordering and review of radiographic studies, pulse oximetry, re-evaluation of patient's condition and review of old charts   I assumed direction of critical care for this patient from another provider in my specialty: no     (including critical care time)  Medications Ordered in ED Medications  diltiazem (CARDIZEM) 1 mg/mL load via infusion 10 mg (10 mg Intravenous Bolus from Bag 09/22/19 0804)    And  diltiazem (CARDIZEM) 125 mg in dextrose 5% 125 mL (1 mg/mL) infusion (15 mg/hr Intravenous Rate/Dose Change 09/22/19 0909)  sodium chloride 0.9 % bolus 1,000 mL (0 mLs Intravenous Stopped 09/22/19 [id number])    ED Course  I have reviewed the triage vital signs and the nursing notes.  Pertinent labs & imaging results that were available during my care of the patient were reviewed by me and considered in my medical decision making (see chart for details).    MDM  Rules/Calculators/A&P    Joshua Hudson is an 85 year old male with history of heart failure, hypertension, heart disease who presents to the ED from endoscopy suite with what appears to be atrial fibrillation with RVR.  Patient does not have a history of the same.  Patient states that he is felt fatigued for the last several days.  Maybe has felt palpitations during that time.  He is on Plavix but no other blood thinners.  He denies any chest pain or shortness of breath.  Does not endorse any infectious symptoms including cough, urinary symptoms.  EKG appears consistent with A. fib with RVR.  We will get labs including electrolytes, magnesium.  Will start patient on diltiazem bolus and infusion.  Patient  with no significant anemia, electrolyte abnormality, kidney injury.  Chest x-ray without signs of infection.  Patient continues to be stable on diltiazem infusion.  It has been increased as patient continues to have A. fib with RVR in the 150s.  Will admit for further care.  Hemodynamically stable and fairly asymptomatic with no chest pain, no shortness of breath.  This chart was dictated using voice recognition software.  Despite best efforts to proofread,  errors can occur which can change the documentation meaning.    Final Clinical Impression(s) / ED Diagnoses Final diagnoses:  Atrial fibrillation with RVR [facility])    Rx / DC Orders ED Discharge Orders    None       Lennice Sites, DO 09/22/19 [id number]

## 2019-09-22 NOTE — ED Notes (Signed)
Patient handed phone and helped dial daughter at this time.

## 2019-09-22 NOTE — ED Notes (Signed)
Report given to [place], RN from [place], [place].

## 2019-09-22 NOTE — Progress Notes (Signed)
  Echocardiogram 2D Echocardiogram has been performed.  Joshua Hudson 09/22/2019, 12:38 PM

## 2019-09-22 NOTE — ED Triage Notes (Addendum)
Patient arrived via ENDO with HR 155. Patient has no history of AFIB but does take plavix - stent placed over summer. Patient c/o no symptoms and no pain.  Daughter in lobby [place] [phone]

## 2019-09-22 NOTE — Consult Note (Addendum)
CONSULTATION NOTE   Patient Name: Joshua Hudson Date of Encounter: 09/22/2019 Cardiologist: [name], MD  Chief Complaint   Short of breath  Patient Profile   85 yo male with history of severe AS s/p 26 mm Edwards AVR in 2009 and recent valve-in-valve TAVR (26 mm Edwards Sapien THV). Presented from endoscopy after being found in rapid rhythm.  HPI   Joshua Hudson is a 85 y.o. male who is being seen today for the evaluation of afib with RVR at the request of Dr. Tamala Julian. This is an 85 yo male patient of Dr. Radford Pax with history of AS s/p Edwards bioprosthetic AVR in 2009, ultimately he developed severe AI and then had valve-in-valve TAVR in August 2020.  He was seen in follow-up and felt to be doing fairly well.  Postoperative echo showed normal valve gradients with no paravalvular leak.  He was seen today for endoscopy procedure and found to be in rapid rhythm with heart rate in the 150s.  He was scheduled to have an esophageal dilatation - apparently told to do this periodically with Dr. Therisa Doyne. He says he has been fatigued for about 1 month, but worse this past week. He reports orthopnea, DOE and LE edema. An echo was performed in the ER - I personally reviewed this and it demonstrates moderately reduced LV systolic function. EKG demonstrates a regular narrow complex tachycardia, suspect atrial flutter at 2:1 conduction (rate 152) - subsequently, EKG's show what appears to be typical atrial flutter with variable ventricular response. He was started on IV heparin.  PMHx   Past Medical History:  Diagnosis Date  . Achalasia 06/12/2019   Noted on EGD  . Aortic insufficiency 03/24/2019   AI of bioprosthetic AVR severe 4 plus  . Chronic diastolic CHF (congestive heart failure) ([place]) 03/24/2019  . Colon polyps    s/p diverticular perforation requiring 2-stage repair  . Derangement of right shoulder joint    need replacing has no use of  . Diverticulosis   . DJD (degenerative joint  disease), lumbosacral   . Dysphagia    eats soft food  . Esophageal stricture   . GERD (gastroesophageal reflux disease)   . Hemolytic anemia ([place])   . History of blood transfusion given with 04-15-19 surgery  . History of blood transfusion 07/18/2019  . History of kidney stones    passed stones  . Hypertension   . Mitral regurgitation    moderate  . Occipital neuralgia   . Pancytopenia ([place])   . Postoperative atrial fibrillation ([place]) 05/10/2015  . Prosthetic valve dysfunction   . Rheumatoid arthritis ([place])    s/o long term steroids  shoulders and hands  . Rotator cuff arthropathy    right  . S/P aortic valve replacement with bioprosthetic valve 01/16/2008   67mm Edwards Magna perimount bovine pericardial tissue valve, model 3000  . S/P valve-in-valve TAVR 04/15/2019   26 mm Edwards Sapien 3 Ultra transcatheter heart valve placed via percutaneous right transfemoral approach   . SBE (subacute bacterial endocarditis) prophylaxis candidate    for dental procedures  . Schatzki's ring 06/12/2019   Narrowing, Noted on EGD  . Severe aortic stenosis    S/P prosthetic valve replacement w 25 mm Edwards like science percardial tissue valve,Turner - 01/2008  . Thrombocytopenia ([place])     Past Surgical History:  Procedure Laterality Date  . APPENDECTOMY    . BALLOON DILATION N/A 06/12/2019   Procedure: BALLOON DILATION;  Surgeon: Ronnette Juniper, MD;  [facility];  Service: Gastroenterology;  Laterality: N/A;  . BOTOX INJECTION N/A 01/22/2018   Procedure: BOTOX INJECTION;  Surgeon: Ronnette Juniper, MD;  [facility];  Service: Gastroenterology;  Laterality: N/A;  . CARDIAC CATHETERIZATION     09  . CARDIAC VALVE REPLACEMENT  01/2008   aortic valve replacement  . CATARACT EXTRACTION W/ INTRAOCULAR LENS  IMPLANT, BILATERAL    . COLON RESECTION     diverticulitis   . CORONARY ANGIOPLASTY    . dental implants     permanent  . ESOPHAGEAL MANOMETRY N/A 11/07/2017   Procedure:  ESOPHAGEAL MANOMETRY (EM);  Surgeon: Ronnette Juniper, MD;  [facility];  Service: Gastroenterology;  Laterality: N/A;  . ESOPHAGOGASTRODUODENOSCOPY (EGD) WITH PROPOFOL N/A 01/22/2018   Procedure: ESOPHAGOGASTRODUODENOSCOPY (EGD) WITH PROPOFOL;  Surgeon: Ronnette Juniper, MD;  [facility];  Service: Gastroenterology;  Laterality: N/A;  . ESOPHAGOGASTRODUODENOSCOPY (EGD) WITH PROPOFOL N/A 04/30/2019   Procedure: ESOPHAGOGASTRODUODENOSCOPY (EGD) WITH PROPOFOL;  Surgeon: Laurence Spates, MD;  Location: [place];  Service: Endoscopy;  Laterality: N/A;  . ESOPHAGOGASTRODUODENOSCOPY (EGD) WITH PROPOFOL N/A 06/12/2019   Procedure: ESOPHAGOGASTRODUODENOSCOPY (EGD) WITH PROPOFOL;  Surgeon: Ronnette Juniper, MD;  [facility];  Service: Gastroenterology;  Laterality: N/A;  with botox injection  . EXCISIONAL TOTAL SHOULDER ARTHROPLASTY WITH ANTIBIOTIC SPACER Right 12/31/2018   Procedure: EXCISIONAL TOTAL SHOULDER ARTHROPLASTY WITH ANTIBIOTIC SPACER;  Surgeon: Netta Cedars, MD;  Location: [place];  Service: Orthopedics;  Laterality: Right;  . HERNIA REPAIR    . INTRAOPERATIVE TRANSTHORACIC ECHOCARDIOGRAM N/A 04/15/2019   Procedure: Intraoperative Transthoracic Echocardiogram;  Surgeon: Sherren Mocha, MD;  Location: [place];  Service: Open Heart Surgery;  Laterality: N/A;  . IRRIGATION AND DEBRIDEMENT SHOULDER Right 11/20/2018    IRRIGATION AND DEBRIDEMENT SHOULDER WITH POLY EXCHANGE (Right Shoulder)  . IRRIGATION AND DEBRIDEMENT SHOULDER Right 11/20/2018   Procedure: IRRIGATION AND DEBRIDEMENT SHOULDER WITH POLY EXCHANGE;  Surgeon: Netta Cedars, MD;  Location: [place];  Service: Orthopedics;  Laterality: Right;  . LUMBAR LAMINECTOMY     x 2  . REVERSE SHOULDER ARTHROPLASTY Right 03/01/2018   Procedure: RIGHT REVERSE SHOULDER ARTHROPLASTY;  Surgeon: Netta Cedars, MD;  Location: [place];  Service: Orthopedics;  Laterality: Right;  . REVERSE SHOULDER ARTHROPLASTY Right 07/18/2019   Procedure: REVERSE  TOTAL SHOULDER ARTHROPLASTY and removal of antiobotic spacer;  Surgeon: Netta Cedars, MD;  [facility];  Service: Orthopedics;  Laterality: Right;  interscalene block  . REVERSE SHOULDER ARTHROPLASTY Right 08/06/2019   Procedure: Reduction of dislocated reverse total shoulder and poly exchange;  Surgeon: Netta Cedars, MD;  [facility];  Service: Orthopedics;  Laterality: Right;  need 1 hour  . RIGHT/LEFT HEART CATH AND CORONARY ANGIOGRAPHY N/A 04/02/2019   Procedure: RIGHT/LEFT HEART CATH AND CORONARY ANGIOGRAPHY;  Surgeon: Leonie Man, MD;  Location: [place] [facility];  Service: Cardiovascular;  Laterality: N/A;  . SAVORY DILATION N/A 01/22/2018   Procedure: SAVORY DILATION;  Surgeon: Ronnette Juniper, MD;  [facility];  Service: Gastroenterology;  Laterality: N/A;  . SAVORY DILATION N/A 04/30/2019   Procedure: SAVORY DILATION;  Surgeon: Laurence Spates, MD;  Location: [place];  Service: Endoscopy;  Laterality: N/A;  With fluro  . SHOULDER HEMI-ARTHROPLASTY Right 06/14/2018   Procedure: RIGHT  REVERSE TOTAL SHOULDER OPEN POLY EXCHANGE;  Surgeon: Netta Cedars, MD;  Location: [place];  Service: Orthopedics;  Laterality: Right;  . SUBMUCOSAL INJECTION  06/12/2019   Procedure: SUBMUCOSAL INJECTION;  Surgeon: Ronnette Juniper, MD;  [facility];  Service: Gastroenterology;;  . TEE WITHOUT CARDIOVERSION N/A 01/29/2014   Procedure: TRANSESOPHAGEAL ECHOCARDIOGRAM (TEE);  Surgeon: Sueanne Margarita, MD;  Location: [facility];  Service: Cardiovascular;  Laterality: N/A;  . TEE WITHOUT CARDIOVERSION N/A 04/02/2019   Procedure: TRANSESOPHAGEAL ECHOCARDIOGRAM (TEE);  Surgeon: Josue Hector, MD;  Location: [facility] ENDOSCOPY;  Service: Cardiovascular;  Laterality: N/A;  . TONSILLECTOMY    . TRANSCATHETER AORTIC VALVE REPLACEMENT, TRANSFEMORAL  04/15/2019  . TRANSCATHETER AORTIC VALVE REPLACEMENT, TRANSFEMORAL N/A 04/15/2019   Procedure: TRANSCATHETER AORTIC VALVE REPLACEMENT, TRANSFEMORAL  with POST BALLOON DILATION;  Surgeon: Sherren Mocha, MD;  Location: [place];  Service: Open Heart Surgery;  Laterality: N/A;    FAMHx   Family History  Problem Relation Age of Onset  . Other Mother        NO HEALTH PROBLEMS  . Heart disease Father   . Arthritis Father   . Heart Problems Sister        RELATED TO A MVA  . Suicidality Brother   . Other Sister        [place]  . Other Brother        2 [place]  . Other Daughter        2 [place]    [place]CHx    reports that he quit smoking about 36 years ago. His smoking use included cigarettes. He started smoking about 45 years ago. He has a 5.00 pack-year smoking history. He has quit using smokeless tobacco.  His smokeless tobacco use included chew. He reports previous alcohol use. He reports that he does not use drugs.  Outpatient Medications   No current facility-administered medications on file prior to encounter.   Current Outpatient Medications on File Prior to Encounter  Medication Sig Dispense Refill  . aspirin EC 81 MG EC tablet Take 1 tablet (81 mg total) by mouth daily. 30 tablet   . clopidogrel (PLAVIX) 75 MG tablet Take 1 tablet (75 mg total) by mouth daily with breakfast. 90 tablet 3  . finasteride (PROSCAR) 5 MG tablet Take 5 mg by mouth daily as needed (retention).     . folic acid (FOLVITE) 1 MG tablet Take 1 mg by mouth daily.    . furosemide (LASIX) 40 MG tablet Take 40 mg daily. (Patient taking differently: Take 40 mg by mouth daily. ) 90 tablet 3  . metoprolol succinate (TOPROL-XL) 25 MG 24 hr tablet Take 1 tablet (25 mg total) by mouth daily. 90 tablet 3  . Multiple Vitamin (MULTIVITAMIN WITH MINERALS) TABS tablet Take 1 tablet by mouth daily.    Marland Kitchen oxymetazoline (AFRIN) 0.05 % nasal spray Place 1 spray into both nostrils 2 (two) times daily as needed for congestion.    . pantoprazole (PROTONIX) 40 MG tablet Take 40 mg by mouth daily before breakfast.     . potassium chloride  SA (K-DUR) 20 MEQ tablet Take 20 meq daily (Patient taking differently: Take 20 mEq by mouth daily. ) 90 tablet 3  . predniSONE (DELTASONE) 5 MG tablet Take 5 mg by mouth daily with breakfast.     . THERATEARS 0.25 % SOLN Place 1 drop into both eyes 3 (three) times daily as needed (dry/irritated eyes.).    Marland Kitchen ALPRAZolam (XANAX) 0.25 MG tablet Take 1 tablet (0.25 mg total) by mouth at bedtime as needed for anxiety or sleep. (Patient not taking: Reported on 09/17/2019) 30 tablet 0  . feeding supplement, ENSURE ENLIVE, (ENSURE ENLIVE) LIQD Take  237 mLs by mouth 3 (three) times daily between meals. (Patient not taking: Reported on 09/17/2019) 237 mL 12  . HYDROcodone-acetaminophen (NORCO) 5-325 MG tablet Take 1 tablet by mouth every 6 (six) hours as needed for moderate pain or severe pain. (Patient not taking: Reported on 09/17/2019) 30 tablet 0    Inpatient Medications    Scheduled Meds: . acetaminophen      . folic acid  1 mg Oral Daily  . [START ON 09/23/2019] furosemide  40 mg Oral Daily  . [START ON 09/23/2019] pantoprazole  40 mg Oral QAC breakfast  . [START ON 09/23/2019] potassium chloride SA  20 mEq Oral Daily  . potassium chloride  40 mEq Oral STAT  . predniSONE  5 mg Oral Q breakfast  . sodium chloride flush  3 mL Intravenous Q12H    Continuous Infusions: . diltiazem (CARDIZEM) infusion Stopped (09/22/19 1207)  . heparin 650 Units/hr (09/22/19 1250)    PRN Meds: acetaminophen **OR** acetaminophen, finasteride, ondansetron **OR** ondansetron (ZOFRAN) IV, oxymetazoline, polyvinyl alcohol   ALLERGIES   No Known Allergies  ROS   Pertinent items noted in HPI and remainder of comprehensive ROS otherwise negative.  Vitals   Vitals:   09/22/19 1301 09/22/19 1315 09/22/19 1330 09/22/19 1345  BP: 107/63 109/81 99/84 (!) 99/59  Pulse: (!) 113 91 68 74  Resp: (!) 28 15 (!) 34 18  Temp:      TempSrc:      SpO2: 95% 100% 98% 100%   No intake or output data in the 24 hours ending  09/22/19 1429 There were no vitals filed for this visit.  Physical Exam   General appearance: alert, no distress and thin Neck: JVD - several cm above sternal notch, no carotid bruit and thyroid not enlarged, symmetric, no tenderness/mass/nodules Lungs: diminished breath sounds bibasilar Heart: irregularly irregular rhythm and systolic murmur: early systolic 2/6, crescendo at 2nd right intercostal space Abdomen: soft, non-tender; bowel sounds normal; no masses,  no organomegaly Extremities: edema 1+ LE edema Pulses: 2+ and symmetric Skin: Skin color, texture, turgor normal. No rashes or lesions Neurologic: Grossly normal Psych: Pleasant  Labs   Results for orders placed or performed during the hospital encounter of 09/22/19 (from the past 48 hour(s))  Sample to Blood Bank     Status: None   Collection Time: 09/22/19  7:45 AM  Result Value Ref Range   Blood Bank Specimen SAMPLE AVAILABLE FOR TESTING    Sample Expiration      09/25/2019,2359 Performed at [facility], [place] [address] [place], [place] [address]   CBC with Differential     Status: Abnormal   Collection Time: 09/22/19  7:49 AM  Result Value Ref Range   WBC 6.5 4.0 - 10.5 K/uL   RBC 4.25 4.22 - 5.81 MIL/uL   Hemoglobin 9.5 (L) 13.0 - 17.0 g/dL   HCT 30.7 (L) 39.0 - 52.0 %   MCV 72.2 (L) 80.0 - 100.0 fL   MCH 22.4 (L) 26.0 - 34.0 pg   MCHC 30.9 30.0 - 36.0 g/dL   RDW 17.4 (H) 11.5 - 15.5 %   Platelets 142 (L) 150 - 400 K/uL   nRBC 0.5 (H) 0.0 - 0.2 %   Neutrophils Relative % 63 %   Neutro Abs 4.1 1.7 - 7.7 K/uL   Lymphocytes Relative 19 %   Lymphs Abs 1.2 0.7 - 4.0 K/uL   Monocytes Relative 12 %   Monocytes Absolute 0.8 0.1 - 1.0 K/uL  Eosinophils Relative 4 %   Eosinophils Absolute 0.3 0.0 - 0.5 K/uL   Basophils Relative 1 %   Basophils Absolute 0.0 0.0 - 0.1 K/uL   Immature Granulocytes 1 %   Abs Immature Granulocytes 0.03 0.00 - 0.07 K/uL    Comment: Performed at [facility], [place] [address] [place], [place] [id number]  Basic metabolic panel     Status: Abnormal   Collection Time: 09/22/19  7:49 AM  Result Value Ref Range   Sodium 141 135 - 145 mmol/L   Potassium 3.6 3.5 - 5.1 mmol/L   Chloride 104 98 - 111 mmol/L   CO2 29 22 - 32 mmol/L   Glucose, Bld 89 70 - 99 mg/dL   BUN 19 8 - 23 mg/dL   Creatinine, Ser 1.21 0.61 - 1.24 mg/dL   Calcium 9.0 8.9 - 10.3 mg/dL   GFR calc non Af Amer 55 (L) >60 mL/min   GFR calc Af Amer >60 >60 mL/min   Anion gap 8 5 - 15    Comment: Performed at [facility], [place] [address] [place], [place] [address]  Hepatic function panel     Status: Abnormal   Collection Time: 09/22/19  7:49 AM  Result Value Ref Range   Total Protein 6.0 (L) 6.5 - 8.1 g/dL   Albumin 3.7 3.5 - 5.0 g/dL   AST 27 15 - 41 U/L   ALT 17 0 - 44 U/L   Alkaline Phosphatase 64 38 - 126 U/L   Total Bilirubin 1.0 0.3 - 1.2 mg/dL   Bilirubin, Direct 0.2 0.0 - 0.2 mg/dL   Indirect Bilirubin 0.8 0.3 - 0.9 mg/dL    Comment: Performed at [facility], [place] [address], [place], [place] [address]  Magnesium     Status: None   Collection Time: 09/22/19  7:49 AM  Result Value Ref Range   Magnesium 2.2 1.7 - 2.4 mg/dL    Comment: Performed at [facility], [place] [address], [place], [place] [address]  TSH     Status: None   Collection Time: 09/22/19  7:49 AM  Result Value Ref Range   TSH 2.108 0.350 - 4.500 uIU/mL    Comment: Performed by a 3rd Generation assay with a functional sensitivity of <=0.01 uIU/mL. Performed at [facility], [place] [employer] [address] [place], [place] [address]   Brain natriuretic peptide     Status: Abnormal   Collection Time: 09/22/19  7:49 AM  Result Value Ref Range   B Natriuretic Peptide 175.3 (H) 0.0 - 100.0 pg/mL    Comment: Performed at [facility], [place] [address], [place], [place] [address]  Troponin I (High Sensitivity)      Status: Abnormal   Collection Time: 09/22/19 10:55 AM  Result Value Ref Range   Troponin I (High Sensitivity) 28 (H) <18 ng/L    Comment: (NOTE) Elevated high sensitivity troponin I (hsTnI) values and significant  changes across serial measurements may suggest ACS but many other  chronic and acute conditions are known to elevate hsTnI results.  Refer to the "Links" section for chest pain algorithms and additional  guidance. Performed at [facility], [place] [address], [place], [place] [address]   Troponin I (High Sensitivity)     Status: Abnormal   Collection Time: 09/22/19  1:07 PM  Result Value Ref Range   Troponin I (High Sensitivity) 32 (H) <18 ng/L    Comment: (NOTE)  Elevated high sensitivity troponin I (hsTnI) values and significant  changes across serial measurements may suggest ACS but many other  chronic and acute conditions are known to elevate hsTnI results.  Refer to the "Links" section for chest pain algorithms and additional  guidance. Performed at [facility] [place] [address], [place], [place] [address]     ECG   2:1 atrial flutter at 152 - Personally Reviewed  Telemetry   Atrial flutter with variable VR - Personally Reviewed  Radiology   DG Chest Portable 1 View  Result Date: 09/22/2019 CLINICAL DATA:  New atrial fibrillation. EXAM: PORTABLE CHEST 1 VIEW COMPARISON:  Chest radiograph 04/28/2019 FINDINGS: Heart size at the upper limits of normal. Status post TAVR and prior median sternotomy. Aortic atherosclerosis. There is no airspace consolidation. No evidence of pleural effusion or pneumothorax. No acute bony abnormality. Right shoulder prosthesis. Overlying cardiac monitoring leads. IMPRESSION: Heart size at the upper limits of normal. Status post TAVR and median sternotomy. No airspace consolidation or pulmonary edema. Aortic atherosclerosis. Electronically Signed   By: Kellie Simmering DO   On: 09/22/2019 08:13    Cardiac  Studies   Echo pending  Impression   Principal Problem:   New onset atrial flutter (HCC) Active Problems:   S/P valve-in-valve TAVR   Acute systolic (congestive) heart failure ([place])   Recommendation   1. Atrial flutter: this is new onset, although there is a remote history of post-operative afib. There is atrial enlargement and moderately decreased LV function. Will ultimately need DCCV to establish sinus rhythm. His CHADVASC score is 6, therefore, would recommend anticoagulation. Will d/c heparin and start Eliquis 5 mg BID. Ok to stop ASA and Plavix at this point >6 months out of TAVR (I d/w Nell Range from the valve team). Will need better rate control with IV diltiazem. Ultimately, would consider pursuing TEE/DCCV, however, given esophageal stricture - would be difficult unless he had esophageal dilatation prior to that while inpatient - could touch base with GI on this. May need long-term AAD therapy as well, but do not want to start since he could already have thrombus. 2. Acute systolic congestive heart failure: Echo shows newly reduced LVEF with mildly elevated BNP, symptoms consistent with CHF. Suspect tachy-mediated. Echo as of 05/2019 showed LVEF 55-60% with grade 2 DD - suspect LVEF now in the mid-40's. Would recommend diuresis. 3. History of pancytopenia: Ok to stop ASA and Plavix in the setting of anti-thrombotic therapy. 4. Esophageal stricture: Would coordinate with GI as to whether this could be treated as inpatient to allow TEE/DCCV. 5. Electrolytes: Replete K and Mg 6. Elevated troponin: 29, 38 - suspect demand ischemia, no chest pain. Ok to switch from heparin to Eliquis.  Thanks for the consult. Cardiology will follow with you.  Time Spent Directly with Patient:  I have spent a total of 45 minutes with the patient reviewing hospital notes, telemetry, EKGs, labs and examining the patient as well as establishing an assessment and plan that was discussed personally with  the patient.  > 50% of time was spent in direct patient care.  Length of Stay:  LOS: 0 days   Pixie Casino, MD, [facility], [place] Director of the [facility] Lip[facility]  Card[facility]ovascular [facility] Diplomate of the American Board of Clinical Lipidology Attending Cardiologist  Direct Dial: [phone]  Fax: [phone]  Website:  [email or web address] [place]game 09/22/2019, 2:29 PM

## 2019-09-22 NOTE — ED Notes (Signed)
Spoke to Dr. Tamala Julian on the phone at this time, Cardizem drip stopped. Patient HR 70, and is in sinus rhythm.

## 2019-09-22 NOTE — ED Notes (Signed)
ED Provider at bedside. 

## 2019-09-23 DIAGNOSIS — Z953 Presence of xenogenic heart valve: Secondary | ICD-10-CM | POA: Diagnosis not present

## 2019-09-23 DIAGNOSIS — Z7952 Long term (current) use of systemic steroids: Secondary | ICD-10-CM | POA: Diagnosis not present

## 2019-09-23 DIAGNOSIS — I4892 Unspecified atrial flutter: Secondary | ICD-10-CM | POA: Diagnosis present

## 2019-09-23 DIAGNOSIS — I248 Other forms of acute ischemic heart disease: Secondary | ICD-10-CM | POA: Diagnosis present

## 2019-09-23 DIAGNOSIS — R778 Other specified abnormalities of plasma proteins: Secondary | ICD-10-CM | POA: Diagnosis not present

## 2019-09-23 DIAGNOSIS — K220 Achalasia of cardia: Secondary | ICD-10-CM | POA: Diagnosis present

## 2019-09-23 DIAGNOSIS — I5021 Acute systolic (congestive) heart failure: Secondary | ICD-10-CM | POA: Diagnosis present

## 2019-09-23 DIAGNOSIS — Z79899 Other long term (current) drug therapy: Secondary | ICD-10-CM | POA: Diagnosis not present

## 2019-09-23 DIAGNOSIS — Z87891 Personal history of nicotine dependence: Secondary | ICD-10-CM | POA: Diagnosis not present

## 2019-09-23 DIAGNOSIS — D509 Iron deficiency anemia, unspecified: Secondary | ICD-10-CM | POA: Diagnosis present

## 2019-09-23 DIAGNOSIS — Z87442 Personal history of urinary calculi: Secondary | ICD-10-CM | POA: Diagnosis not present

## 2019-09-23 DIAGNOSIS — D696 Thrombocytopenia, unspecified: Secondary | ICD-10-CM | POA: Diagnosis present

## 2019-09-23 DIAGNOSIS — Z952 Presence of prosthetic heart valve: Secondary | ICD-10-CM | POA: Diagnosis not present

## 2019-09-23 DIAGNOSIS — M069 Rheumatoid arthritis, unspecified: Secondary | ICD-10-CM | POA: Diagnosis present

## 2019-09-23 DIAGNOSIS — Z7902 Long term (current) use of antithrombotics/antiplatelets: Secondary | ICD-10-CM | POA: Diagnosis not present

## 2019-09-23 DIAGNOSIS — Z20822 Contact with and (suspected) exposure to covid-19: Secondary | ICD-10-CM | POA: Diagnosis present

## 2019-09-23 DIAGNOSIS — Z7982 Long term (current) use of aspirin: Secondary | ICD-10-CM | POA: Diagnosis not present

## 2019-09-23 DIAGNOSIS — N400 Enlarged prostate without lower urinary tract symptoms: Secondary | ICD-10-CM | POA: Diagnosis present

## 2019-09-23 DIAGNOSIS — I110 Hypertensive heart disease with heart failure: Secondary | ICD-10-CM | POA: Diagnosis present

## 2019-09-23 DIAGNOSIS — Z8261 Family history of arthritis: Secondary | ICD-10-CM | POA: Diagnosis not present

## 2019-09-23 DIAGNOSIS — I340 Nonrheumatic mitral (valve) insufficiency: Secondary | ICD-10-CM | POA: Diagnosis not present

## 2019-09-23 DIAGNOSIS — Z8249 Family history of ischemic heart disease and other diseases of the circulatory system: Secondary | ICD-10-CM | POA: Diagnosis not present

## 2019-09-23 DIAGNOSIS — I081 Rheumatic disorders of both mitral and tricuspid valves: Secondary | ICD-10-CM | POA: Diagnosis present

## 2019-09-23 DIAGNOSIS — K219 Gastro-esophageal reflux disease without esophagitis: Secondary | ICD-10-CM | POA: Diagnosis present

## 2019-09-23 DIAGNOSIS — I4891 Unspecified atrial fibrillation: Secondary | ICD-10-CM | POA: Diagnosis present

## 2019-09-23 DIAGNOSIS — R000 Tachycardia, unspecified: Secondary | ICD-10-CM | POA: Diagnosis present

## 2019-09-23 DIAGNOSIS — Z86010 Personal history of colonic polyps: Secondary | ICD-10-CM | POA: Diagnosis not present

## 2019-09-23 DIAGNOSIS — I959 Hypotension, unspecified: Secondary | ICD-10-CM | POA: Diagnosis present

## 2019-09-23 LAB — BASIC METABOLIC PANEL
Anion gap: 10 (ref 5–15)
BUN: 23 mg/dL (ref 8–23)
CO2: 24 mmol/L (ref 22–32)
Calcium: 8.9 mg/dL (ref 8.9–10.3)
Chloride: 106 mmol/L (ref 98–111)
Creatinine, Ser: 1.08 mg/dL (ref 0.61–1.24)
GFR calc Af Amer: >60 mL/min (ref >60)
GFR calc non Af Amer: >60 mL/min (ref >60)
Glucose, Bld: 82 mg/dL (ref 70–99)
Potassium: 4.2 mmol/L (ref 3.5–5.1)
Sodium: 140 mmol/L (ref 135–145)

## 2019-09-23 LAB — MRSA PCR SCREENING: MRSA by PCR: NEGATIVE

## 2019-09-23 LAB — CBC
HCT: 28.5 % — ABNORMAL LOW (ref 39.0–52.0)
Hemoglobin: 8.7 g/dL — ABNORMAL LOW (ref 13.0–17.0)
MCH: 22 pg — ABNORMAL LOW (ref 26.0–34.0)
MCHC: 30.5 g/dL (ref 30.0–36.0)
MCV: 72.2 fL — ABNORMAL LOW (ref 80.0–100.0)
Platelets: 146 10*3/uL — ABNORMAL LOW (ref 150–400)
RBC: 3.95 MIL/uL — ABNORMAL LOW (ref 4.22–5.81)
RDW: 17.2 % — ABNORMAL HIGH (ref 11.5–15.5)
WBC: 6.8 10*3/uL (ref 4.0–10.5)
nRBC: 0.3 % — ABNORMAL HIGH (ref 0.0–0.2)

## 2019-09-23 LAB — SARS CORONAVIRUS 2 (TAT 6-24 HRS): SARS Coronavirus 2: NEGATIVE

## 2019-09-23 MED ORDER — METOPROLOL TARTRATE 5 MG/5ML IV SOLN
2.5000 mg | Freq: Four times a day (QID) | INTRAVENOUS | Status: DC | PRN
  Administered 2019-09-23 – 2019-09-24 (×2): 2.5 mg via INTRAVENOUS
  Filled 2019-09-23 (×2): qty 5

## 2019-09-23 MED ORDER — METOPROLOL TARTRATE 12.5 MG HALF TABLET
12.5000 mg | ORAL_TABLET | Freq: Two times a day (BID) | ORAL | Status: DC
  Administered 2019-09-23 – 2019-09-25 (×5): 12.5 mg via ORAL
  Filled 2019-09-23 (×5): qty 1

## 2019-09-23 MED ORDER — DILTIAZEM HCL 25 MG/5ML IV SOLN
10.0000 mg | Freq: Once | INTRAVENOUS | Status: AC
  Administered 2019-09-23: 10 mg via INTRAVENOUS
  Filled 2019-09-23: qty 5

## 2019-09-23 MED ORDER — ZOLPIDEM TARTRATE 5 MG PO TABS
5.0000 mg | ORAL_TABLET | Freq: Every evening | ORAL | Status: DC | PRN
  Administered 2019-09-23 – 2019-09-24 (×2): 5 mg via ORAL
  Filled 2019-09-23 (×2): qty 1

## 2019-09-23 MED ORDER — DILTIAZEM LOAD VIA INFUSION
10.0000 mg | Freq: Once | INTRAVENOUS | Status: DC
  Filled 2019-09-23: qty 10

## 2019-09-23 NOTE — Progress Notes (Signed)
PROGRESS NOTE  Joshua Hudson [id number] DOB: Nov 17, 1934 DOA: 09/22/2019 PCP: Lavone Orn, MD  HPI/Recap of past 24 hours:  Joshua Hudson is a 85 y.o. male with medical history significant of hypertension, aortic insufficiency S/P tavr, esophageal stricture, and GERD.  Patient had initially presented to endoscopy clinic to undergo esophageal stretching with Dr. Therisa Doyne, but was found to be in atrial fibrillation with RVR.  Patient reports over the last 3 to 4 days that he has not felt great.  Associated symptoms include palpitations, dizziness with standing, shortness of breath with exertion, and intermittent leg swelling.  Denies having any chest pain, loss of consciousness, nausea, vomiting, abdominal pain, diarrhea, or cough symptoms.  He has been eating a soft diet due to his esophageal stricture.  ED Course: Upon admission into the emergency department patient was seen to have heart rates into the 150s and tachypneic with blood pressures 96/78-115/83.  Chest x-ray showed heart at the upper limits of normal, but no signs of edema.  Labs significant for hemoglobin 9.5, platelet count 142, potassium 3.6, magnesium 2.2.  COVID-19 screening was -4 days ago.  Patient had been given 1 L normal saline IV fluids, 10 mg of diltiazem IV, and then started on a diltiazem drip.  TRH consulted to admit.  09/23/19: Seen and examined.  Denies chest pain or palpitation.  No dyspnea at rest at this time.  Heart rate elevated.  Cardiology following.  Likely TEE/DCCV on Thursday, 09/25/2019.  Assessment/Plan: Principal Problem:   New onset atrial flutter (HCC) Active Problems:   S/P valve-in-valve TAVR   GERD (gastroesophageal reflux disease)   History of esophageal stricture   Thrombocytopenia (HCC)   Acute systolic (congestive) heart failure (HCC)   Elevated troponin   Microcytic hypochromic anemia  Newly onset atrial fibrillation with RVR:  Patient presents in atrial fibrillation with heart rates  into the 150s.  Patient was given 10 mg of diltiazem and then started on a diltiazem drip.  Records noted prior history of postoperative atrial fibrillation but he was not on formal anticoagulation. CHA2DS2-VASc score = 6. -Goal magnesium 2 and potassium 4 -TSH WNL -2D echo LVEF 45-50% Left Atrium: Left atrial size was moderately dilated. Right Atrium: Right atrial size was moderately dilated Started on eliquis by cards for primary CVA prevention On po lopressor 12.5 mg BID Appreciate cards assistance.  Likely TEE/DCCV on Thursday, 09/25/2019.  Acute Systolic heart failure:  Patient reports having lower extremity swelling and progressively worsening shortness of breath with exertion.   BNP mildly elevated at 175.3.   -Daily weights and strict I&Os -Furosemide 20 mg IV twice daily judiciously due to low blood pressure  Essential hypertension with Soft BP Maintain MAP>65 as possible On BB for rate control On diuretic for acute sCHF Continue to monitor vital signs  Elevated troponin likely demand ischemia:  Mildly elevated troponin of 29 and subsequently 38.  Denies anginal symptoms  Microcytic hypochromic anemia: Chronic.   Hemoglobin 9.5 on admission which appears improved from previous.  Last iron studies on 04/29/2019 noted iron, TIBC 160, and ferritin 1771. -Continue to monitor  S/p TAVR:  Recs per cards  Thrombocytopenia: Acute.  stable -Continue to monitor  BPH -On hold finasteride  GERD/achalasia esophageal stricture:  -Continue outpatient follow-up with Dr. Therisa Doyne  DVT prophylaxis:  Eliquis Code Status: Full Family Communication:  will call to update Disposition Plan: Possible discharge home in 1 to 2 days or when cardiology signs off. Consults called: Cardiology  Objective: Vitals:   09/23/19 1200 09/23/19 1300 09/23/19 1400 09/23/19 1445  BP: 120/80   99/63  Pulse: (!) 121 (!) 116 (!) 118 (!) 117  Resp: (!) 25 (!) 28 20 (!) 25  Temp: 97.8 F (36.6  C)     TempSrc: Oral     SpO2: 100% 97% 100% 98%  Weight:      Height:        Intake/Output Summary (Last 24 hours) at 09/23/2019 1517 Last data filed at 09/23/2019 1400 Gross per 24 hour  Intake 960 ml  Output 2875 ml  Net -1915 ml   Filed Weights   09/22/19 2328  Weight: 47.6 kg    Exam:  . General: 85 y.o. year-old male well developed well nourished in no acute distress.  Alert and oriented x3. . Cardiovascular: IRRR with no rubs or gallops.  No thyromegaly or JVD noted.   Marland Kitchen Respiratory: Clear to auscultation with no wheezes or rales. Good inspiratory effort. . Abdomen: Soft nontender nondistended with normal bowel sounds x4 quadrants. . Musculoskeletal: 2+ pitting edema in lower extremity bilaterally.  Marland Kitchen Psychiatry: Mood is appropriate for condition and setting   Data Reviewed: CBC: Recent Labs  Lab 09/22/19 0749 09/23/19 0137  WBC 6.5 6.8  NEUTROABS 4.1  --   HGB 9.5* 8.7*  HCT 30.7* 28.5*  MCV 72.2* 72.2*  PLT 142* 123456*   Basic Metabolic Panel: Recent Labs  Lab 09/22/19 0749 09/23/19 0137  NA 141 140  K 3.6 4.2  CL 104 106  CO2 29 24  GLUCOSE 89 82  BUN 19 23  CREATININE 1.21 1.08  CALCIUM 9.0 8.9  MG 2.2  --    GFR: Estimated Creatinine Clearance: 35.5 mL/min (by C-G formula based on SCr of 1.08 mg/dL). Liver Function Tests: Recent Labs  Lab 09/22/19 0749  AST 27  ALT 17  ALKPHOS 64  BILITOT 1.0  PROT 6.0*  ALBUMIN 3.7   No results for input(s): LIPASE, AMYLASE in the last 168 hours. No results for input(s): AMMONIA in the last 168 hours. Coagulation Profile: No results for input(s): INR, PROTIME in the last 168 hours. Cardiac Enzymes: No results for input(s): CKTOTAL, CKMB, CKMBINDEX, TROPONINI in the last 168 hours. BNP (last 3 results) Recent Labs    03/18/19 1542 04/23/19 1432  PROBNP 3,511* 1,582*   HbA1C: No results for input(s): HGBA1C in the last 72 hours. CBG: No results for input(s): GLUCAP in the last 168  hours. Lipid Profile: No results for input(s): CHOL, HDL, LDLCALC, TRIG, CHOLHDL, LDLDIRECT in the last 72 hours. Thyroid Function Tests: Recent Labs    09/22/19 0749  TSH 2.108   Anemia Panel: No results for input(s): VITAMINB12, FOLATE, FERRITIN, TIBC, IRON, RETICCTPCT in the last 72 hours. Urine analysis:    Component Value Date/Time   COLORURINE YELLOW 09/22/2019 1543   APPEARANCEUR CLEAR 09/22/2019 1543   LABSPEC 1.019 09/22/2019 1543   PHURINE 6.0 09/22/2019 1543   GLUCOSEU NEGATIVE 09/22/2019 1543   HGBUR NEGATIVE 09/22/2019 1543   BILIRUBINUR NEGATIVE 09/22/2019 1543   KETONESUR 20 (A) 09/22/2019 1543   PROTEINUR 30 (A) 09/22/2019 1543   UROBILINOGEN 0.2 01/14/2008 0530   NITRITE NEGATIVE 09/22/2019 1543   LEUKOCYTESUR NEGATIVE 09/22/2019 1543   Sepsis Labs: @LABRCNTIP (procalcitonin:4,lacticidven:4)  ) Recent Results (from the past 240 hour(s))  Novel Coronavirus, NAA (Hosp order, Send-out to Ref Lab; TAT 18-24 hrs     Status: None   Collection Time: 09/18/19  9:45 AM  Specimen: Nasopharyngeal Swab; Respiratory  Result Value Ref Range Status   SARS-CoV-2, NAA NOT DETECTED NOT DETECTED Final    Comment: (NOTE) This nucleic acid amplification test was developed and its performance characteristics determined by [employer]. Nucleic acid amplification tests include PCR and TMA. This test has not been FDA cleared or approved. This test has been authorized by FDA under an Emergency Use Authorization (EUA). This test is only authorized for the duration of time the declaration that circumstances exist justifying the authorization of the emergency use of in vitro diagnostic tests for detection of SARS-CoV-2 virus and/or diagnosis of COVID-19 infection under section 564(b)(1) of the Act, 21 U.S.C. [id number]) (1), unless the authorization is terminated or revoked sooner. When diagnostic testing is negative, the possibility of a false negative result should be  considered in the context of a patient's recent exposures and the presence of clinical signs and symptoms consistent with COVID-19. An individual without symptoms of COVID- 19 and who is not shedding SARS-CoV-2 vi rus would expect to have a negative (not detected) result in this assay. Performed At: [facility] [address] [place], [place] [id number] Rush Farmer MD [id number]    [place]  Final    Comment: Performed at [facility], [place] [address] [place], [place] [address]  SARS CORONAVIRUS 2 (TAT 6-24 HRS) Nasopharyngeal Nasopharyngeal Swab     Status: None   Collection Time: 09/22/19 10:31 PM   Specimen: Nasopharyngeal Swab  Result Value Ref Range Status   SARS Coronavirus 2 NEGATIVE NEGATIVE Final    Comment: (NOTE) SARS-CoV-2 target nucleic acids are NOT DETECTED. The SARS-CoV-2 RNA is generally detectable in upper and lower respiratory specimens during the acute phase of infection. Negative results do not preclude SARS-CoV-2 infection, do not rule out co-infections with other pathogens, and should not be used as the sole basis for treatment or other patient management decisions. Negative results must be combined with clinical observations, patient history, and epidemiological information. The expected result is Negative. Fact Sheet for Patients: SugarRoll.be Fact Sheet for Healthcare Providers: https://www.woods-mathews.com/ This test is not yet approved or cleared by the [place] FDA and  has been authorized for detection and/or diagnosis of SARS-CoV-2 by FDA under an Emergency Use Authorization (EUA). This EUA will remain  in effect (meaning this test can be used) for the duration of the COVID-19 declaration under Section 56 4(b)(1) of the Act, 21 U.S.C. section 360bbb-3(b)(1), unless the authorization is terminated or revoked sooner. Performed at [facility], [place] [address] [place], [place] [address]   MRSA PCR Screening     Status: None   Collection Time: 09/22/19 10:57 PM   Specimen: Nasopharyngeal  Result Value Ref Range Status   MRSA by PCR NEGATIVE NEGATIVE Final    Comment:        The GeneXpert MRSA Assay (FDA approved for NASAL specimens only), is one component of a comprehensive MRSA colonization surveillance program. It is not intended to diagnose MRSA infection nor to guide or monitor treatment for MRSA infections. Performed at [facility], [place] [address] [place],  [address]       Studies: No results found.  Scheduled Meds: . apixaban  2.5 mg Oral BID  . Chlorhexidine Gluconate Cloth  6 each Topical Daily  . folic acid  1 mg Oral Daily  . furosemide  20 mg Intravenous BID  . metoprolol tartrate  12.5 mg Oral BID  . pantoprazole  40 mg Oral QAC breakfast  . potassium chloride SA  20 mEq Oral Daily  . predniSONE  5 mg Oral Q breakfast  . sodium chloride flush  3 mL Intravenous Q12H    Continuous Infusions:   LOS: 0 days     Kayleen Memos, MD [facility] Pager [phone]  If 7PM-7AM, please contact night-coverage [email or web address] Password [place] 09/23/2019, 3:17 PM

## 2019-09-23 NOTE — Progress Notes (Signed)
Progress Note  Patient Name: Joshua Hudson Date of Encounter: 09/23/2019  Primary Cardiologist: [name], MD   Subjective   No chest pain or dyspnea. HR in 100-130s.   Inpatient Medications    Scheduled Meds: . apixaban  2.5 mg Oral BID  . Chlorhexidine Gluconate Cloth  6 each Topical Daily  . folic acid  1 mg Oral Daily  . furosemide  20 mg Intravenous BID  . pantoprazole  40 mg Oral QAC breakfast  . potassium chloride SA  20 mEq Oral Daily  . predniSONE  5 mg Oral Q breakfast  . sodium chloride flush  3 mL Intravenous Q12H   Continuous Infusions:  PRN Meds: acetaminophen **OR** acetaminophen, finasteride, metoprolol tartrate, ondansetron **OR** ondansetron (ZOFRAN) IV, oxymetazoline, polyvinyl alcohol   Vital Signs    Vitals:   09/23/19 0700 09/23/19 0800 09/23/19 0900 09/23/19 1000  BP: 112/77 116/83 110/75 99/65  Pulse: (!) 126 (!) 115 (!) 112 (!) 143  Resp: 20 (!) 22 (!) 21 (!) 24  Temp:  98.4 F (36.9 C)    TempSrc:  Oral    SpO2: 97% 98% 98% 95%  Weight:      Height:        Intake/Output Summary (Last 24 hours) at 09/23/2019 1113 Last data filed at 09/23/2019 0900 Gross per 24 hour  Intake 720 ml  Output 1675 ml  Net -955 ml   Last 3 Weights 09/22/2019 09/15/2019 08/06/2019  Weight (lbs) 104 lb 15 oz 105 lb 104 lb 6.4 oz  Weight (kg) 47.6 kg 47.628 kg 47.356 kg      Telemetry    afib at 100-130s- Personally Reviewed  ECG  No new tracing   Physical Exam   GEN: No acute distress.   Neck: No JVD Cardiac: Ir IR tachycardia , no murmurs, rubs, or gallops.  Respiratory: Clear to auscultation bilaterally. GI: Soft, nontender, non-distended  MS: No edema; No deformity. Neuro:  Nonfocal  Psych: Normal affect   Labs    High Sensitivity Troponin:   Recent Labs  Lab 09/22/19 1055 09/22/19 1307  TROPONINIHS 28* 32*      Chemistry Recent Labs  Lab 09/22/19 0749 09/23/19 0137  NA 141 140  K 3.6 4.2  CL 104 106  CO2 29 24  GLUCOSE  89 82  BUN 19 23  CREATININE 1.21 1.08  CALCIUM 9.0 8.9  PROT 6.0*  --   ALBUMIN 3.7  --   AST 27  --   ALT 17  --   ALKPHOS 64  --   BILITOT 1.0  --   GFRNONAA 55* >60  GFRAA >60 >60  ANIONGAP 8 10     Hematology Recent Labs  Lab 09/22/19 0749 09/23/19 0137  WBC 6.5 6.8  RBC 4.25 3.95*  HGB 9.5* 8.7*  HCT 30.7* 28.5*  MCV 72.2* 72.2*  MCH 22.4* 22.0*  MCHC 30.9 30.5  RDW 17.4* 17.2*  PLT 142* 146*    BNP Recent Labs  Lab 09/22/19 0749  BNP 175.3*     DDimer No results for input(s): DDIMER in the last 168 hours.   Radiology    DG Chest Portable 1 View  Result Date: 09/22/2019 CLINICAL DATA:  New atrial fibrillation. EXAM: PORTABLE CHEST 1 VIEW COMPARISON:  Chest radiograph 04/28/2019 FINDINGS: Heart size at the upper limits of normal. Status post TAVR and prior median sternotomy. Aortic atherosclerosis. There is no airspace consolidation. No evidence of pleural effusion or pneumothorax. No acute bony  abnormality. Right shoulder prosthesis. Overlying cardiac monitoring leads. IMPRESSION: Heart size at the upper limits of normal. Status post TAVR and median sternotomy. No airspace consolidation or pulmonary edema. Aortic atherosclerosis. Electronically Signed   By: Kellie Simmering DO   On: 09/22/2019 08:13   ECHOCARDIOGRAM COMPLETE  Result Date: 09/22/2019   ECHOCARDIOGRAM REPORT   Patient Name:   Joshua Hudson Date of Exam: 09/22/2019 Medical Rec #:  [id number]       Height:       62.0 in Accession #:    [id number]      Weight:       105.0 lb Date of Birth:  Aug 09, 1935       BSA:          1.45 m Patient Age:    85 years        BP:           87/59 mmHg Patient Gender: M               HR:           99 bpm. Exam Location:  Inpatient Procedure: 2D Echo, Cardiac Doppler and Color Doppler Indications:    I48.0 Paroxysmal atrial fibrillation  History:        Patient has prior history of Echocardiogram examinations, most                 recent 05/14/2019. CHF, Aortic Valve Disease;  Risk                 Factors:Hypertension and GERD.  Sonographer:    Tiffany Dance Referring Phys: [id number] RONDELL A SMITH IMPRESSIONS  1. Left ventricular ejection fraction, by visual estimation, is 45 to 50%. The left ventricle has mildly decreased function. There is no left ventricular hypertrophy.  2. Left ventricular diastolic parameters are indeterminate.  3. The left ventricle demonstrates global hypokinesis.  4. Global right ventricle has mildly reduced systolic function.The right ventricular size is normal. No increase in right ventricular wall thickness.  5. Left atrial size was moderately dilated.  6. Right atrial size was moderately dilated.  7. Moderate mitral annular calcification.  8. The mitral valve is normal in structure. Trivial mitral valve regurgitation. No evidence of mitral stenosis.  9. The tricuspid valve is normal in structure. Tricuspid valve regurgitation is mild. 10. The patient had initial surgical bioprosthetic aortic valve followed by valve-in-valve TAVR with 26 mm Edwards Sapien Ultra THV. The gradient across the valve was not measured. There appeared to be trivial peri-valvular regurgitation. 11. The inferior vena cava is normal in size with greater than 50% respiratory variability, suggesting right atrial pressure of 3 mmHg. 12. The tricuspid regurgitant velocity is 2.28 m/s, and with an assumed right atrial pressure of 3 mmHg, the estimated right ventricular systolic pressure is normal at 23.8 mmHg. 13. The patient was in atrial fibrillation. FINDINGS  Left Ventricle: Left ventricular ejection fraction, by visual estimation, is 45 to 50%. The left ventricle has mildly decreased function. The left ventricle demonstrates global hypokinesis. The left ventricular internal cavity size was the left ventricle is normal in size. There is no left ventricular hypertrophy. Left ventricular diastolic parameters are indeterminate. Right Ventricle: The right ventricular size is normal. No  increase in right ventricular wall thickness. Global RV systolic function is has mildly reduced systolic function. The tricuspid regurgitant velocity is 2.28 m/s, and with an assumed right atrial pressure of 3 mmHg, the estimated right ventricular systolic pressure is normal  at 23.8 mmHg. Left Atrium: Left atrial size was moderately dilated. Right Atrium: Right atrial size was moderately dilated Pericardium: There is no evidence of pericardial effusion. Mitral Valve: The mitral valve is normal in structure. There is mild calcification of the mitral valve leaflet(s). Moderate mitral annular calcification. Trivial mitral valve regurgitation. No evidence of mitral valve stenosis by observation. Tricuspid Valve: The tricuspid valve is normal in structure. Tricuspid valve regurgitation is mild. Aortic Valve: The aortic valve has been repaired/replaced. Aortic valve regurgitation is trivial. 26 Edwards Edwards Sapien bioprosthetic, stented aortic valve (TAVR) valve is present in the aortic position. The patient had initial surgical bioprosthetic  aortic valve followed by valve-in-valve TAVR with 26 mm Edwards Sapien Ultra THV. Pulmonic Valve: The pulmonic valve was normal in structure. Pulmonic valve regurgitation is trivial. Pulmonic regurgitation is trivial. Aorta: The aortic root is normal in size and structure. Venous: The inferior vena cava is normal in size with greater than 50% respiratory variability, suggesting right atrial pressure of 3 mmHg. IAS/Shunts: No atrial level shunt detected by color flow Doppler.  LEFT VENTRICLE PLAX 2D LVIDd:         4.00 cm LVIDs:         2.70 cm LV PW:         1.10 cm LV IVS:        1.00 cm LVOT diam:     1.80 cm LV SV:         43 ml LV SV Index:   29.85 LVOT Area:     2.54 cm  RIGHT VENTRICLE            IVC RV Basal diam:  3.10 cm    IVC diam: 1.50 cm RV Mid diam:    2.10 cm RV S prime:     6.74 cm/s TAPSE (M-mode): 1.3 cm LEFT ATRIUM             Index       RIGHT ATRIUM            Index LA diam:        4.50 cm 3.10 cm/m  RA Area:     23.60 cm LA Vol (A2C):   76.2 ml 52.42 ml/m RA Volume:   72.30 ml  49.73 ml/m LA Vol (A4C):   50.8 ml 34.94 ml/m LA Biplane Vol: 65.9 ml 45.33 ml/m  AORTIC VALVE LVOT Vmax:   71.10 cm/s LVOT Vmean:  45.450 cm/s LVOT VTI:    0.133 m  AORTA Ao Root diam: 2.80 cm Ao Asc diam:  3.20 cm MITRAL VALVE                        TRICUSPID VALVE MV Area (PHT): 4.40 cm             TR Peak grad:   20.8 mmHg MV PHT:        50.02 msec           TR Vmax:        228.00 cm/s MV Decel Time: 173 msec MV E velocity: 119.50 cm/s 103 cm/s SHUNTS                                     Systemic VTI:  0.13 m  Systemic Diam: 1.80 cm  Loralie Champagne MD Electronically signed by Loralie Champagne MD Signature Date/Time: 09/22/2019/3:04:39 PM    Final     Cardiac Studies   Echo 09/22/19 1. Left ventricular ejection fraction, by visual estimation, is 45 to 50%. The left ventricle has mildly decreased function. There is no left ventricular hypertrophy.  2. Left ventricular diastolic parameters are indeterminate.  3. The left ventricle demonstrates global hypokinesis.  4. Global right ventricle has mildly reduced systolic function.The right ventricular size is normal. No increase in right ventricular wall thickness.  5. Left atrial size was moderately dilated.  6. Right atrial size was moderately dilated.  7. Moderate mitral annular calcification.  8. The mitral valve is normal in structure. Trivial mitral valve regurgitation. No evidence of mitral stenosis.  9. The tricuspid valve is normal in structure. Tricuspid valve regurgitation is mild. 10. The patient had initial surgical bioprosthetic aortic valve followed by valve-in-valve TAVR with 26 mm Edwards Sapien Ultra THV. The gradient across the valve was not measured. There appeared to be trivial peri-valvular regurgitation. 11. The inferior vena cava is normal in size with greater than 50%  respiratory variability, suggesting right atrial pressure of 3 mmHg. 12. The tricuspid regurgitant velocity is 2.28 m/s, and with an assumed right atrial pressure of 3 mmHg, the estimated right ventricular systolic pressure is normal at 23.8 mmHg. 13. The patient was in atrial fibrillation.  Patient Profile     85 y.o. male with history of severe AS s/p 26 mm Edwards AVR in 2009 (post op afib) and recent valve-in-valve TAVR 04/2019 (26 mm Edwards Sapien THV) presented from endoscopy ( He was scheduled to have an esophageal dilatation - apparently told to do this periodically with Dr. Therisa Doyne) however noted in afib RVR/aflutter and admitted.   Assessment & Plan    1.  New onset atrial flutter with rapid ventricular rate -Patient has a remote history of postoperative atrial fibrillation.  He was placed on IV Cardizem for rate control however discontinued due to hypotension.  He got 2.5 mg of IV metoprolol for elevated heart rate overnight.  Currently heart rate in 100-130s with soft blood pressure. CHADSVASCs score of 6.  Anticoagulated with Eliquis.  -Antiarrhythmic drug versus TEE cardioversion with GI help due to esophageal dilatation per Dr. Debara Pickett.  2.  Acute systolic heart failure -Episode LV function of 45 to 50%.  He appears euvolemic.  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  Consider IV Lasix to p.o. or as needed per MD.  He appears euvolemic.  3.  Esophageal stricture -Need to coordinate care with GI  4.  Elevated troponin -Flat trend.  No chest pain.  Demand in setting of tachycardia.  5. S/p AVR in 2009 & most recent TVAR 04/2019 - trivial peri-valvular regurgitation -Now off aspirin and Plavix due to need of anticoagulation   For questions or updates, please contact [place] Please consult [email or web address] for contact info under        SignedLeanor Kail, PA  09/23/2019, 11:13 AM

## 2019-09-23 NOTE — Progress Notes (Signed)
Pt's heart rate increasing into the 140's ST. 2.5mg  metoprolol given and Dr Nevada Crane made aware.

## 2019-09-24 ENCOUNTER — Encounter: Payer: Self-pay | Admitting: Hematology and Oncology

## 2019-09-24 LAB — BASIC METABOLIC PANEL
Anion gap: 9 (ref 5–15)
BUN: 29 mg/dL — ABNORMAL HIGH (ref 8–23)
CO2: 28 mmol/L (ref 22–32)
Calcium: 8.9 mg/dL (ref 8.9–10.3)
Chloride: 102 mmol/L (ref 98–111)
Creatinine, Ser: 1.24 mg/dL (ref 0.61–1.24)
GFR calc Af Amer: >60 mL/min (ref >60)
GFR calc non Af Amer: 54 mL/min — ABNORMAL LOW (ref >60)
Glucose, Bld: 102 mg/dL — ABNORMAL HIGH (ref 70–99)
Potassium: 3.5 mmol/L (ref 3.5–5.1)
Sodium: 139 mmol/L (ref 135–145)

## 2019-09-24 LAB — CBC
HCT: 31.1 % — ABNORMAL LOW (ref 39.0–52.0)
Hemoglobin: 9.5 g/dL — ABNORMAL LOW (ref 13.0–17.0)
MCH: 21.7 pg — ABNORMAL LOW (ref 26.0–34.0)
MCHC: 30.5 g/dL (ref 30.0–36.0)
MCV: 71.2 fL — ABNORMAL LOW (ref 80.0–100.0)
Platelets: 181 10*3/uL (ref 150–400)
RBC: 4.37 MIL/uL (ref 4.22–5.81)
RDW: 17 % — ABNORMAL HIGH (ref 11.5–15.5)
WBC: 8 10*3/uL (ref 4.0–10.5)
nRBC: 0.3 % — ABNORMAL HIGH (ref 0.0–0.2)

## 2019-09-24 MED ORDER — ENSURE ENLIVE PO LIQD
237.0000 mL | Freq: Two times a day (BID) | ORAL | Status: DC
  Administered 2019-09-25: 237 mL via ORAL

## 2019-09-24 MED ORDER — SODIUM CHLORIDE 0.9 % IV SOLN: INTRAVENOUS | Status: DC

## 2019-09-24 NOTE — Progress Notes (Signed)
Progress Note  Patient Name: Joshua Hudson Date of Encounter: 09/24/2019  Primary Cardiologist: [name], MD   Subjective   No chest pain no SOB, feels well sitting up in chair.    Inpatient Medications    Scheduled Meds:  apixaban  2.5 mg Oral BID   Chlorhexidine Gluconate Cloth  6 each Topical Daily   folic acid  1 mg Oral Daily   furosemide  20 mg Intravenous BID   metoprolol tartrate  12.5 mg Oral BID   pantoprazole  40 mg Oral QAC breakfast   potassium chloride SA  20 mEq Oral Daily   predniSONE  5 mg Oral Q breakfast   sodium chloride flush  3 mL Intravenous Q12H   Continuous Infusions:  PRN Meds: acetaminophen **OR** acetaminophen, finasteride, metoprolol tartrate, ondansetron **OR** ondansetron (ZOFRAN) IV, oxymetazoline, polyvinyl alcohol, zolpidem   Vital Signs    Vitals:   09/24/19 0356 09/24/19 0400 09/24/19 0518 09/24/19 0800  BP:   120/76   Pulse:  (!) 109 88   Resp:  19 (!) 24   Temp: 98.4 F (36.9 C)   (!) 97.3 F (36.3 C)  TempSrc: Oral   Oral  SpO2:  98% 99%   Weight:      Height:        Intake/Output Summary (Last 24 hours) at 09/24/2019 1033 Last data filed at 09/24/2019 0934 Gross per 24 hour  Intake 490 ml  Output 2600 ml  Net -2110 ml   Last 3 Weights 09/22/2019 09/15/2019 08/06/2019  Weight (lbs) 104 lb 15 oz 105 lb 104 lb 6.4 oz  Weight (kg) 47.6 kg 47.628 kg 47.356 kg      Telemetry    A fib HR 130 to 113 - Personally Reviewed  ECG    No new - Personally Reviewed  Physical Exam   GEN: No acute distress.   Neck: No JVD Cardiac: irreg irreg, no murmurs, rubs, or gallops.  Respiratory: Clear to auscultation bilaterally. GI: Soft, nontender, non-distended  MS: No edema; No deformity. Neuro:  Nonfocal  Psych: Normal affect   Labs    High Sensitivity Troponin:   Recent Labs  Lab 09/22/19 1055 09/22/19 1307  TROPONINIHS 28* 32*      Chemistry Recent Labs  Lab 09/22/19 0749 09/23/19 0137  09/24/19 0510  NA 141 140 139  K 3.6 4.2 3.5  CL 104 106 102  CO2 29 24 28   GLUCOSE 89 82 102*  BUN 19 23 29*  CREATININE 1.21 1.08 1.24  CALCIUM 9.0 8.9 8.9  PROT 6.0*  --   --   ALBUMIN 3.7  --   --   AST 27  --   --   ALT 17  --   --   ALKPHOS 64  --   --   BILITOT 1.0  --   --   GFRNONAA 55* >60 54*  GFRAA >60 >60 >60  ANIONGAP 8 10 9      Hematology Recent Labs  Lab 09/22/19 0749 09/23/19 0137 09/24/19 0118  WBC 6.5 6.8 8.0  RBC 4.25 3.95* 4.37  HGB 9.5* 8.7* 9.5*  HCT 30.7* 28.5* 31.1*  MCV 72.2* 72.2* 71.2*  MCH 22.4* 22.0* 21.7*  MCHC 30.9 30.5 30.5  RDW 17.4* 17.2* 17.0*  PLT 142* 146* 181    BNP Recent Labs  Lab 09/22/19 0749  BNP 175.3*     DDimer No results for input(s): DDIMER in the last 168 hours.   Radiology  ECHOCARDIOGRAM COMPLETE  Result Date: 09/22/2019   ECHOCARDIOGRAM REPORT   Patient Name:   Joshua Hudson Date of Exam: 09/22/2019 Medical Rec #:  [id number]       Height:       62.0 in Accession #:    [id number]      Weight:       105.0 lb Date of Birth:  1935-08-17       BSA:          1.45 m Patient Age:    85 years        BP:           87/59 mmHg Patient Gender: M               HR:           99 bpm. Exam Location:  Inpatient Procedure: 2D Echo, Cardiac Doppler and Color Doppler Indications:    I48.0 Paroxysmal atrial fibrillation  History:        Patient has prior history of Echocardiogram examinations, most                 recent 05/14/2019. CHF, Aortic Valve Disease; Risk                 Factors:Hypertension and GERD.  Sonographer:    Tiffany Dance Referring Phys: [id number] RONDELL A SMITH IMPRESSIONS  1. Left ventricular ejection fraction, by visual estimation, is 45 to 50%. The left ventricle has mildly decreased function. There is no left ventricular hypertrophy.  2. Left ventricular diastolic parameters are indeterminate.  3. The left ventricle demonstrates global hypokinesis.  4. Global right ventricle has mildly reduced systolic  function.The right ventricular size is normal. No increase in right ventricular wall thickness.  5. Left atrial size was moderately dilated.  6. Right atrial size was moderately dilated.  7. Moderate mitral annular calcification.  8. The mitral valve is normal in structure. Trivial mitral valve regurgitation. No evidence of mitral stenosis.  9. The tricuspid valve is normal in structure. Tricuspid valve regurgitation is mild. 10. The patient had initial surgical bioprosthetic aortic valve followed by valve-in-valve TAVR with 26 mm Edwards Sapien Ultra THV. The gradient across the valve was not measured. There appeared to be trivial peri-valvular regurgitation. 11. The inferior vena cava is normal in size with greater than 50% respiratory variability, suggesting right atrial pressure of 3 mmHg. 12. The tricuspid regurgitant velocity is 2.28 m/s, and with an assumed right atrial pressure of 3 mmHg, the estimated right ventricular systolic pressure is normal at 23.8 mmHg. 13. The patient was in atrial fibrillation. FINDINGS  Left Ventricle: Left ventricular ejection fraction, by visual estimation, is 45 to 50%. The left ventricle has mildly decreased function. The left ventricle demonstrates global hypokinesis. The left ventricular internal cavity size was the left ventricle is normal in size. There is no left ventricular hypertrophy. Left ventricular diastolic parameters are indeterminate. Right Ventricle: The right ventricular size is normal. No increase in right ventricular wall thickness. Global RV systolic function is has mildly reduced systolic function. The tricuspid regurgitant velocity is 2.28 m/s, and with an assumed right atrial pressure of 3 mmHg, the estimated right ventricular systolic pressure is normal at 23.8 mmHg. Left Atrium: Left atrial size was moderately dilated. Right Atrium: Right atrial size was moderately dilated Pericardium: There is no evidence of pericardial effusion. Mitral Valve: The  mitral valve is normal in structure. There is mild calcification of the mitral valve leaflet(s). Moderate mitral  annular calcification. Trivial mitral valve regurgitation. No evidence of mitral valve stenosis by observation. Tricuspid Valve: The tricuspid valve is normal in structure. Tricuspid valve regurgitation is mild. Aortic Valve: The aortic valve has been repaired/replaced. Aortic valve regurgitation is trivial. 26 Edwards Edwards Sapien bioprosthetic, stented aortic valve (TAVR) valve is present in the aortic position. The patient had initial surgical bioprosthetic  aortic valve followed by valve-in-valve TAVR with 26 mm Edwards Sapien Ultra THV. Pulmonic Valve: The pulmonic valve was normal in structure. Pulmonic valve regurgitation is trivial. Pulmonic regurgitation is trivial. Aorta: The aortic root is normal in size and structure. Venous: The inferior vena cava is normal in size with greater than 50% respiratory variability, suggesting right atrial pressure of 3 mmHg. IAS/Shunts: No atrial level shunt detected by color flow Doppler.  LEFT VENTRICLE PLAX 2D LVIDd:         4.00 cm LVIDs:         2.70 cm LV PW:         1.10 cm LV IVS:        1.00 cm LVOT diam:     1.80 cm LV SV:         43 ml LV SV Index:   29.85 LVOT Area:     2.54 cm  RIGHT VENTRICLE            IVC RV Basal diam:  3.10 cm    IVC diam: 1.50 cm RV Mid diam:    2.10 cm RV S prime:     6.74 cm/s TAPSE (M-mode): 1.3 cm LEFT ATRIUM             Index       RIGHT ATRIUM           Index LA diam:        4.50 cm 3.10 cm/m  RA Area:     23.60 cm LA Vol (A2C):   76.2 ml 52.42 ml/m RA Volume:   72.30 ml  49.73 ml/m LA Vol (A4C):   50.8 ml 34.94 ml/m LA Biplane Vol: 65.9 ml 45.33 ml/m  AORTIC VALVE LVOT Vmax:   71.10 cm/s LVOT Vmean:  45.450 cm/s LVOT VTI:    0.133 m  AORTA Ao Root diam: 2.80 cm Ao Asc diam:  3.20 cm MITRAL VALVE                        TRICUSPID VALVE MV Area (PHT): 4.40 cm             TR Peak grad:   20.8 mmHg MV PHT:         50.02 msec           TR Vmax:        228.00 cm/s MV Decel Time: 173 msec MV E velocity: 119.50 cm/s 103 cm/s SHUNTS                                     Systemic VTI:  0.13 m                                     Systemic Diam: 1.80 cm  Loralie Champagne MD Electronically signed by Loralie Champagne MD Signature Date/Time: 09/22/2019/3:04:39 PM    Final     Cardiac Studies   Echo 09/22/19  IMPRESSIONS    1. Left ventricular ejection fraction, by visual estimation, is 45 to 50%. The left ventricle has mildly decreased function. There is no left ventricular hypertrophy.  2. Left ventricular diastolic parameters are indeterminate.  3. The left ventricle demonstrates global hypokinesis.  4. Global right ventricle has mildly reduced systolic function.The right ventricular size is normal. No increase in right ventricular wall thickness.  5. Left atrial size was moderately dilated.  6. Right atrial size was moderately dilated.  7. Moderate mitral annular calcification.  8. The mitral valve is normal in structure. Trivial mitral valve regurgitation. No evidence of mitral stenosis.  9. The tricuspid valve is normal in structure. Tricuspid valve regurgitation is mild. 10. The patient had initial surgical bioprosthetic aortic valve followed by valve-in-valve TAVR with 26 mm Edwards Sapien Ultra THV. The gradient across the valve was not measured. There appeared to be trivial peri-valvular regurgitation. 11. The inferior vena cava is normal in size with greater than 50% respiratory variability, suggesting right atrial pressure of 3 mmHg. 12. The tricuspid regurgitant velocity is 2.28 m/s, and with an assumed right atrial pressure of 3 mmHg, the estimated right ventricular systolic pressure is normal at 23.8 mmHg. 13. The patient was in atrial fibrillation.  FINDINGS  Left Ventricle: Left ventricular ejection fraction, by visual estimation, is 45 to 50%. The left ventricle has mildly decreased function. The left  ventricle demonstrates global hypokinesis. The left ventricular internal cavity size was the left  ventricle is normal in size. There is no left ventricular hypertrophy. Left ventricular diastolic parameters are indeterminate.  Right Ventricle: The right ventricular size is normal. No increase in right ventricular wall thickness. Global RV systolic function is has mildly reduced systolic function. The tricuspid regurgitant velocity is 2.28 m/s, and with an assumed right atrial  pressure of 3 mmHg, the estimated right ventricular systolic pressure is normal at 23.8 mmHg.  Left Atrium: Left atrial size was moderately dilated.  Right Atrium: Right atrial size was moderately dilated  Pericardium: There is no evidence of pericardial effusion.  Mitral Valve: The mitral valve is normal in structure. There is mild calcification of the mitral valve leaflet(s). Moderate mitral annular calcification. Trivial mitral valve regurgitation. No evidence of mitral valve stenosis by observation.  Tricuspid Valve: The tricuspid valve is normal in structure. Tricuspid valve regurgitation is mild.  Aortic Valve: The aortic valve has been repaired/replaced. Aortic valve regurgitation is trivial. 26 Edwards Edwards Sapien bioprosthetic, stented aortic valve (TAVR) valve is present in the aortic position. The patient had initial surgical bioprosthetic  aortic valve followed by valve-in-valve TAVR with 26 mm Edwards Sapien Ultra THV.  Pulmonic Valve: The pulmonic valve was normal in structure. Pulmonic valve regurgitation is trivial. Pulmonic regurgitation is trivial.  Aorta: The aortic root is normal in size and structure.  Venous: The inferior vena cava is normal in size with greater than 50% respiratory variability, suggesting right atrial pressure of 3 mmHg.  IAS/Shunts: No atrial level shunt detected by color flow Doppler.    LEFT VENTRICLE PLAX 2D LVIDd:         4.00 cm LVIDs:         2.70  cm LV PW:         1.10 cm LV IVS:        1.00 cm LVOT diam:     1.80 cm LV SV:         43 ml LV SV Index:   29.85 LVOT Area:  2.54 cm    RIGHT VENTRICLE            IVC RV Basal diam:  3.10 cm    IVC diam: 1.50 cm RV Mid diam:    2.10 cm RV S prime:     6.74 cm/s TAPSE (M-mode): 1.3 cm  LEFT ATRIUM             Index       RIGHT ATRIUM           Index LA diam:        4.50 cm 3.10 cm/m  RA Area:     23.60 cm LA Vol (A2C):   76.2 ml 52.42 ml/m RA Volume:   72.30 ml  49.73 ml/m LA Vol (A4C):   50.8 ml 34.94 ml/m LA Biplane Vol: 65.9 ml 45.33 ml/m  AORTIC VALVE LVOT Vmax:   71.10 cm/s LVOT Vmean:  45.450 cm/s LVOT VTI:    0.133 m   AORTA Ao Root diam: 2.80 cm Ao Asc diam:  3.20 cm  MITRAL VALVE                        TRICUSPID VALVE MV Area (PHT): 4.40 cm             TR Peak grad:   20.8 mmHg MV PHT:        50.02 msec           TR Vmax:        228.00 cm/s MV Decel Time: 173 msec MV E velocity: 119.50 cm/s 103 cm/s SHUNTS                                     Systemic VTI:  0.13 m                                     Systemic Diam: 1.80 cm      Patient Profile     85 y.o. male with history of severe AS s/p 26 mm Edwards AVR in 2009 (post op afib) and recent valve-in-valve TAVR 04/2019 (26 mm Edwards Sapien THV) presented from endoscopy (He was scheduled to have an esophageal dilatation- apparently told to do this periodically with Dr. Therisa Doyne) however noted in afib RVR/aflutter and admitted.  Assessment & Plan    1.  New onset atrial flutter with rapid ventricular rate -Patient has a remote history of postoperative atrial fibrillation.  He was placed on IV Cardizem for rate control however discontinued due to hypotension.  He got 2.5 mg of IV metoprolol for elevated heart rate overnight.  Currently heart rate in 100-130s with soft blood pressure. CHADSVASCs score of 6.  Anticoagulated with Eliquis.  -TEE cardioversion 09/25/19 at 8 AM with Dr. Marlou Porch at [facility].  Dr.  Debara Pickett discussed with Dr. Therisa Doyne Pt has achalasia, not stricture. Has been getting botox injections to the LES. She feels it is ok to proceed with TEE/DCCV.   [facility] has been requested to perform a transesophageal echocardiogram on Gracen R Hevener for atrial fib.  After careful review of history and examination, the risks and benefits of transesophageal echocardiogram have been explained including risks of esophageal damage, perforation (1:10,000 risk), bleeding, pharyngeal hematoma as well as other potential complications associated with conscious sedation including aspiration, arrhythmia, respiratory failure  and death. Alternatives to treatment were discussed, questions were answered. Patient is willing to proceed.   2.  Acute systolic heart failure -Episode LV function of 45 to 50%.  He appears euvolemic.  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 . --neg 3 L since admit no wts. On lasix 20 mg IV BID  3.  Esophageal stricture -Pt has achalasia, not stricture. Has been getting botox injections to the LES. GI feels it is ok to proceed with TEE/DCCV.  4.  Elevated troponin -Flat trend.  No chest pain.  Demand in setting of tachycardia.  5. S/p AVR in 2009 & most recent TVAR 04/2019 - trivial peri-valvular regurgitation -Now off aspirin and Plavix due to need of anticoagulation      For questions or updates, please contact [place] Please consult [email or web address] for contact info under        Signed, Cecilie Kicks, NP  09/24/2019, 10:33 AM

## 2019-09-24 NOTE — H&P (View-Only) (Signed)
Progress Note  Patient Name: Joshua Hudson Date of Encounter: 09/24/2019  Primary Cardiologist: [name], MD   Subjective   No chest pain no SOB, feels well sitting up in chair.    Inpatient Medications    Scheduled Meds: . apixaban  2.5 mg Oral BID  . Chlorhexidine Gluconate Cloth  6 each Topical Daily  . folic acid  1 mg Oral Daily  . furosemide  20 mg Intravenous BID  . metoprolol tartrate  12.5 mg Oral BID  . pantoprazole  40 mg Oral QAC breakfast  . potassium chloride SA  20 mEq Oral Daily  . predniSONE  5 mg Oral Q breakfast  . sodium chloride flush  3 mL Intravenous Q12H   Continuous Infusions:  PRN Meds: acetaminophen **OR** acetaminophen, finasteride, metoprolol tartrate, ondansetron **OR** ondansetron (ZOFRAN) IV, oxymetazoline, polyvinyl alcohol, zolpidem   Vital Signs    Vitals:   09/24/19 0356 09/24/19 0400 09/24/19 0518 09/24/19 0800  BP:   120/76   Pulse:  (!) 109 88   Resp:  19 (!) 24   Temp: 98.4 F (36.9 C)   (!) 97.3 F (36.3 C)  TempSrc: Oral   Oral  SpO2:  98% 99%   Weight:      Height:        Intake/Output Summary (Last 24 hours) at 09/24/2019 1033 Last data filed at 09/24/2019 0934 Gross per 24 hour  Intake 490 ml  Output 2600 ml  Net -2110 ml   Last 3 Weights 09/22/2019 09/15/2019 08/06/2019  Weight (lbs) 104 lb 15 oz 105 lb 104 lb 6.4 oz  Weight (kg) 47.6 kg 47.628 kg 47.356 kg      Telemetry    A fib HR 130 to 113 - Personally Reviewed  ECG    No new - Personally Reviewed  Physical Exam   GEN: No acute distress.   Neck: No JVD Cardiac: irreg irreg, no murmurs, rubs, or gallops.  Respiratory: Clear to auscultation bilaterally. GI: Soft, nontender, non-distended  MS: No edema; No deformity. Neuro:  Nonfocal  Psych: Normal affect   Labs    High Sensitivity Troponin:   Recent Labs  Lab 09/22/19 1055 09/22/19 1307  TROPONINIHS 28* 32*      Chemistry Recent Labs  Lab 09/22/19 0749 09/23/19 0137  09/24/19 0510  NA 141 140 139  K 3.6 4.2 3.5  CL 104 106 102  CO2 29 24 28   GLUCOSE 89 82 102*  BUN 19 23 29*  CREATININE 1.21 1.08 1.24  CALCIUM 9.0 8.9 8.9  PROT 6.0*  --   --   ALBUMIN 3.7  --   --   AST 27  --   --   ALT 17  --   --   ALKPHOS 64  --   --   BILITOT 1.0  --   --   GFRNONAA 55* >60 54*  GFRAA >60 >60 >60  ANIONGAP 8 10 9      Hematology Recent Labs  Lab 09/22/19 0749 09/23/19 0137 09/24/19 0118  WBC 6.5 6.8 8.0  RBC 4.25 3.95* 4.37  HGB 9.5* 8.7* 9.5*  HCT 30.7* 28.5* 31.1*  MCV 72.2* 72.2* 71.2*  MCH 22.4* 22.0* 21.7*  MCHC 30.9 30.5 30.5  RDW 17.4* 17.2* 17.0*  PLT 142* 146* 181    BNP Recent Labs  Lab 09/22/19 0749  BNP 175.3*     DDimer No results for input(s): DDIMER in the last 168 hours.   Radiology  ECHOCARDIOGRAM COMPLETE  Result Date: 09/22/2019   ECHOCARDIOGRAM REPORT   Patient Name:   Joshua Hudson Date of Exam: 09/22/2019 Medical Rec #:  [id number]       Height:       62.0 in Accession #:    [id number]      Weight:       105.0 lb Date of Birth:  Feb 07, 1935       BSA:          1.45 m Patient Age:    85 years        BP:           87/59 mmHg Patient Gender: M               HR:           99 bpm. Exam Location:  Inpatient Procedure: 2D Echo, Cardiac Doppler and Color Doppler Indications:    I48.0 Paroxysmal atrial fibrillation  History:        Patient has prior history of Echocardiogram examinations, most                 recent 05/14/2019. CHF, Aortic Valve Disease; Risk                 Factors:Hypertension and GERD.  Sonographer:    Tiffany Dance Referring Phys: [id number] RONDELL A SMITH IMPRESSIONS  1. Left ventricular ejection fraction, by visual estimation, is 45 to 50%. The left ventricle has mildly decreased function. There is no left ventricular hypertrophy.  2. Left ventricular diastolic parameters are indeterminate.  3. The left ventricle demonstrates global hypokinesis.  4. Global right ventricle has mildly reduced systolic  function.The right ventricular size is normal. No increase in right ventricular wall thickness.  5. Left atrial size was moderately dilated.  6. Right atrial size was moderately dilated.  7. Moderate mitral annular calcification.  8. The mitral valve is normal in structure. Trivial mitral valve regurgitation. No evidence of mitral stenosis.  9. The tricuspid valve is normal in structure. Tricuspid valve regurgitation is mild. 10. The patient had initial surgical bioprosthetic aortic valve followed by valve-in-valve TAVR with 26 mm Edwards Sapien Ultra THV. The gradient across the valve was not measured. There appeared to be trivial peri-valvular regurgitation. 11. The inferior vena cava is normal in size with greater than 50% respiratory variability, suggesting right atrial pressure of 3 mmHg. 12. The tricuspid regurgitant velocity is 2.28 m/s, and with an assumed right atrial pressure of 3 mmHg, the estimated right ventricular systolic pressure is normal at 23.8 mmHg. 13. The patient was in atrial fibrillation. FINDINGS  Left Ventricle: Left ventricular ejection fraction, by visual estimation, is 45 to 50%. The left ventricle has mildly decreased function. The left ventricle demonstrates global hypokinesis. The left ventricular internal cavity size was the left ventricle is normal in size. There is no left ventricular hypertrophy. Left ventricular diastolic parameters are indeterminate. Right Ventricle: The right ventricular size is normal. No increase in right ventricular wall thickness. Global RV systolic function is has mildly reduced systolic function. The tricuspid regurgitant velocity is 2.28 m/s, and with an assumed right atrial pressure of 3 mmHg, the estimated right ventricular systolic pressure is normal at 23.8 mmHg. Left Atrium: Left atrial size was moderately dilated. Right Atrium: Right atrial size was moderately dilated Pericardium: There is no evidence of pericardial effusion. Mitral Valve: The  mitral valve is normal in structure. There is mild calcification of the mitral valve leaflet(s). Moderate mitral  annular calcification. Trivial mitral valve regurgitation. No evidence of mitral valve stenosis by observation. Tricuspid Valve: The tricuspid valve is normal in structure. Tricuspid valve regurgitation is mild. Aortic Valve: The aortic valve has been repaired/replaced. Aortic valve regurgitation is trivial. 26 Edwards Edwards Sapien bioprosthetic, stented aortic valve (TAVR) valve is present in the aortic position. The patient had initial surgical bioprosthetic  aortic valve followed by valve-in-valve TAVR with 26 mm Edwards Sapien Ultra THV. Pulmonic Valve: The pulmonic valve was normal in structure. Pulmonic valve regurgitation is trivial. Pulmonic regurgitation is trivial. Aorta: The aortic root is normal in size and structure. Venous: The inferior vena cava is normal in size with greater than 50% respiratory variability, suggesting right atrial pressure of 3 mmHg. IAS/Shunts: No atrial level shunt detected by color flow Doppler.  LEFT VENTRICLE PLAX 2D LVIDd:         4.00 cm LVIDs:         2.70 cm LV PW:         1.10 cm LV IVS:        1.00 cm LVOT diam:     1.80 cm LV SV:         43 ml LV SV Index:   29.85 LVOT Area:     2.54 cm  RIGHT VENTRICLE            IVC RV Basal diam:  3.10 cm    IVC diam: 1.50 cm RV Mid diam:    2.10 cm RV S prime:     6.74 cm/s TAPSE (M-mode): 1.3 cm LEFT ATRIUM             Index       RIGHT ATRIUM           Index LA diam:        4.50 cm 3.10 cm/m  RA Area:     23.60 cm LA Vol (A2C):   76.2 ml 52.42 ml/m RA Volume:   72.30 ml  49.73 ml/m LA Vol (A4C):   50.8 ml 34.94 ml/m LA Biplane Vol: 65.9 ml 45.33 ml/m  AORTIC VALVE LVOT Vmax:   71.10 cm/s LVOT Vmean:  45.450 cm/s LVOT VTI:    0.133 m  AORTA Ao Root diam: 2.80 cm Ao Asc diam:  3.20 cm MITRAL VALVE                        TRICUSPID VALVE MV Area (PHT): 4.40 cm             TR Peak grad:   20.8 mmHg MV PHT:         50.02 msec           TR Vmax:        228.00 cm/s MV Decel Time: 173 msec MV E velocity: 119.50 cm/s 103 cm/s SHUNTS                                     Systemic VTI:  0.13 m                                     Systemic Diam: 1.80 cm  Loralie Champagne MD Electronically signed by Loralie Champagne MD Signature Date/Time: 09/22/2019/3:04:39 PM    Final     Cardiac Studies   Echo 09/22/19  IMPRESSIONS    1. Left ventricular ejection fraction, by visual estimation, is 45 to 50%. The left ventricle has mildly decreased function. There is no left ventricular hypertrophy.  2. Left ventricular diastolic parameters are indeterminate.  3. The left ventricle demonstrates global hypokinesis.  4. Global right ventricle has mildly reduced systolic function.The right ventricular size is normal. No increase in right ventricular wall thickness.  5. Left atrial size was moderately dilated.  6. Right atrial size was moderately dilated.  7. Moderate mitral annular calcification.  8. The mitral valve is normal in structure. Trivial mitral valve regurgitation. No evidence of mitral stenosis.  9. The tricuspid valve is normal in structure. Tricuspid valve regurgitation is mild. 10. The patient had initial surgical bioprosthetic aortic valve followed by valve-in-valve TAVR with 26 mm Edwards Sapien Ultra THV. The gradient across the valve was not measured. There appeared to be trivial peri-valvular regurgitation. 11. The inferior vena cava is normal in size with greater than 50% respiratory variability, suggesting right atrial pressure of 3 mmHg. 12. The tricuspid regurgitant velocity is 2.28 m/s, and with an assumed right atrial pressure of 3 mmHg, the estimated right ventricular systolic pressure is normal at 23.8 mmHg. 13. The patient was in atrial fibrillation.  FINDINGS  Left Ventricle: Left ventricular ejection fraction, by visual estimation, is 45 to 50%. The left ventricle has mildly decreased function. The left  ventricle demonstrates global hypokinesis. The left ventricular internal cavity size was the left  ventricle is normal in size. There is no left ventricular hypertrophy. Left ventricular diastolic parameters are indeterminate.  Right Ventricle: The right ventricular size is normal. No increase in right ventricular wall thickness. Global RV systolic function is has mildly reduced systolic function. The tricuspid regurgitant velocity is 2.28 m/s, and with an assumed right atrial  pressure of 3 mmHg, the estimated right ventricular systolic pressure is normal at 23.8 mmHg.  Left Atrium: Left atrial size was moderately dilated.  Right Atrium: Right atrial size was moderately dilated  Pericardium: There is no evidence of pericardial effusion.  Mitral Valve: The mitral valve is normal in structure. There is mild calcification of the mitral valve leaflet(s). Moderate mitral annular calcification. Trivial mitral valve regurgitation. No evidence of mitral valve stenosis by observation.  Tricuspid Valve: The tricuspid valve is normal in structure. Tricuspid valve regurgitation is mild.  Aortic Valve: The aortic valve has been repaired/replaced. Aortic valve regurgitation is trivial. 26 Edwards Edwards Sapien bioprosthetic, stented aortic valve (TAVR) valve is present in the aortic position. The patient had initial surgical bioprosthetic  aortic valve followed by valve-in-valve TAVR with 26 mm Edwards Sapien Ultra THV.  Pulmonic Valve: The pulmonic valve was normal in structure. Pulmonic valve regurgitation is trivial. Pulmonic regurgitation is trivial.  Aorta: The aortic root is normal in size and structure.  Venous: The inferior vena cava is normal in size with greater than 50% respiratory variability, suggesting right atrial pressure of 3 mmHg.  IAS/Shunts: No atrial level shunt detected by color flow Doppler.    LEFT VENTRICLE PLAX 2D LVIDd:         4.00 cm LVIDs:         2.70  cm LV PW:         1.10 cm LV IVS:        1.00 cm LVOT diam:     1.80 cm LV SV:         43 ml LV SV Index:   29.85 LVOT Area:  2.54 cm    RIGHT VENTRICLE            IVC RV Basal diam:  3.10 cm    IVC diam: 1.50 cm RV Mid diam:    2.10 cm RV S prime:     6.74 cm/s TAPSE (M-mode): 1.3 cm  LEFT ATRIUM             Index       RIGHT ATRIUM           Index LA diam:        4.50 cm 3.10 cm/m  RA Area:     23.60 cm LA Vol (A2C):   76.2 ml 52.42 ml/m RA Volume:   72.30 ml  49.73 ml/m LA Vol (A4C):   50.8 ml 34.94 ml/m LA Biplane Vol: 65.9 ml 45.33 ml/m  AORTIC VALVE LVOT Vmax:   71.10 cm/s LVOT Vmean:  45.450 cm/s LVOT VTI:    0.133 m   AORTA Ao Root diam: 2.80 cm Ao Asc diam:  3.20 cm  MITRAL VALVE                        TRICUSPID VALVE MV Area (PHT): 4.40 cm             TR Peak grad:   20.8 mmHg MV PHT:        50.02 msec           TR Vmax:        228.00 cm/s MV Decel Time: 173 msec MV E velocity: 119.50 cm/s 103 cm/s SHUNTS                                     Systemic VTI:  0.13 m                                     Systemic Diam: 1.80 cm      Patient Profile     85 y.o. male with history of severe AS s/p 26 mm Edwards AVR in 2009 (post op afib) and recent valve-in-valve TAVR 04/2019 (26 mm Edwards Sapien THV) presented from endoscopy (He was scheduled to have an esophageal dilatation- apparently told to do this periodically with Dr. Therisa Doyne) however noted in afib RVR/aflutter and admitted.  Assessment & Plan    1.  New onset atrial flutter with rapid ventricular rate -Patient has a remote history of postoperative atrial fibrillation.  He was placed on IV Cardizem for rate control however discontinued due to hypotension.  He got 2.5 mg of IV metoprolol for elevated heart rate overnight.  Currently heart rate in 100-130s with soft blood pressure. CHADSVASCs score of 6.  Anticoagulated with Eliquis.  -TEE cardioversion 09/25/19 at 8 AM with Dr. Marlou Porch at [facility].  Dr.  Debara Pickett discussed with Dr. Therisa Doyne Pt has achalasia, not stricture. Has been getting botox injections to the LES. She feels it is ok to proceed with TEE/DCCV.   [facility] has been requested to perform a transesophageal echocardiogram on Joshua Hudson for atrial fib.  After careful review of history and examination, the risks and benefits of transesophageal echocardiogram have been explained including risks of esophageal damage, perforation (1:10,000 risk), bleeding, pharyngeal hematoma as well as other potential complications associated with conscious sedation including aspiration, arrhythmia, respiratory failure  and death. Alternatives to treatment were discussed, questions were answered. Patient is willing to proceed.   2.  Acute systolic heart failure -Episode LV function of 45 to 50%.  He appears euvolemic.  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 . --neg 3 L since admit no wts. On lasix 20 mg IV BID  3.  Esophageal stricture -Pt has achalasia, not stricture. Has been getting botox injections to the LES. GI feels it is ok to proceed with TEE/DCCV.  4.  Elevated troponin -Flat trend.  No chest pain.  Demand in setting of tachycardia.  5. S/p AVR in 2009 & most recent TVAR 04/2019 - trivial peri-valvular regurgitation -Now off aspirin and Plavix due to need of anticoagulation      For questions or updates, please contact [place] Please consult [email or web address] for contact info under        Signed, Cecilie Kicks, NP  09/24/2019, 10:33 AM

## 2019-09-24 NOTE — Anesthesia Preprocedure Evaluation (Addendum)
Anesthesia Evaluation  Patient identified by MRN, date of birth, ID band Patient awake    Reviewed: Allergy & Precautions, H&P , NPO status , Patient's Chart, lab work & pertinent test results  Airway Mallampati: II  TM Distance: >3 FB Neck ROM: Full    Dental no notable dental hx. (+) Teeth Intact, Dental Advisory Given   Pulmonary neg pulmonary ROS, former smoker,    Pulmonary exam normal breath sounds clear to auscultation       Cardiovascular hypertension, Pt. on medications +CHF  + dysrhythmias Atrial Fibrillation  Rhythm:Irregular Rate:Tachycardia  09/22/19 Echo . Left ventricular ejection fraction, by visual estimation, is 45 to 50%. The left ventricle has mildly decreased function. There is no left ventricular hypertrophy   Neuro/Psych negative neurological ROS  negative psych ROS   GI/Hepatic Neg liver ROS, GERD  ,  Endo/Other  negative endocrine ROS  Renal/GU negative Renal ROSK+ 3.5 Cr 1.24  negative genitourinary   Musculoskeletal  (+) Arthritis , Osteoarthritis,    Abdominal   Peds  Hematology  (+) Blood dyscrasia, anemia ,   Anesthesia Other Findings   Reproductive/Obstetrics negative OB ROS                           Anesthesia Physical Anesthesia Plan  ASA: III  Anesthesia Plan: General   Post-op Pain Management:    Induction: Intravenous  PONV Risk Score and Plan: 2 and Treatment may vary due to age or medical condition, Ondansetron and Propofol infusion  Airway Management Planned: Nasal Cannula  Additional Equipment: None  Intra-op Plan:   Post-operative Plan:   Informed Consent: I have reviewed the patients History and Physical, chart, labs and discussed the procedure including the risks, benefits and alternatives for the proposed anesthesia with the patient or authorized representative who has indicated his/her understanding and acceptance.     Dental  advisory given  Plan Discussed with: CRNA  Anesthesia Plan Comments:       Anesthesia Quick Evaluation

## 2019-09-24 NOTE — Progress Notes (Signed)
PROGRESS NOTE  Joshua Hudson [name] DOB: 1935-05-23 DOA: 09/22/2019 PCP: Lavone Orn, MD  HPI/Recap of past 24 hours:  Joshua Hudson is a 85 y.o. male with medical history significant of hypertension, aortic insufficiency S/P tavr, esophageal stricture, and GERD.  Patient had initially presented to endoscopy clinic to undergo esophageal stretching with Dr. Therisa Doyne, but was found to be in atrial fibrillation with RVR.  Patient reports over the last 3 to 4 days that he has not felt great.  Associated symptoms include palpitations, dizziness with standing, shortness of breath with exertion, and intermittent leg swelling.  Denies having any chest pain, loss of consciousness, nausea, vomiting, abdominal pain, diarrhea, or cough symptoms.  He has been eating a soft diet due to his esophageal stricture.  ED Course: Upon admission into the emergency department patient was seen to have heart rates into the 150s and tachypneic with blood pressures 96/78-115/83.  Chest x-ray showed heart at the upper limits of normal, but no signs of edema.  Labs significant for hemoglobin 9.5, platelet count 142, potassium 3.6, magnesium 2.2.  COVID-19 screening was -4 days ago.  Patient had been given 1 L normal saline IV fluids, 10 mg of diltiazem IV, and then started on a diltiazem drip.  TRH consulted to admit.   Cardiology following.  Likely TEE/DCCV on Thursday, 09/25/2019.  09/24/19: Seen and examined.  Denies chest pain, dyspnea or palpitations.  Assessment/Plan: Principal Problem:   New onset atrial flutter (HCC) Active Problems:   S/P valve-in-valve TAVR   GERD (gastroesophageal reflux disease)   History of esophageal stricture   Thrombocytopenia (HCC)   Acute systolic (congestive) heart failure (HCC)   Elevated troponin   Microcytic hypochromic anemia  Newly onset atrial fibrillation with RVR:  Patient presents in atrial fibrillation with heart rates into the 150s.  Patient was given 10 mg of  diltiazem and then started on a diltiazem drip.  Records noted prior history of postoperative atrial fibrillation but he was not on formal anticoagulation. CHA2DS2-VASc score = 6. -Goal magnesium 2 and potassium 4 -TSH WNL -2D echo LVEF 45-50% Left Atrium: Left atrial size was moderately dilated. Right Atrium: Right atrial size was moderately dilated Started on eliquis by cards for primary CVA prevention On po lopressor 12.5 mg BID Appreciate cards assistance.  Likely TEE/DCCV on Thursday, 09/25/2019 after 5 doses of eliquis.  Acute Systolic heart failure:  Patient reports having lower extremity swelling and progressively worsening shortness of breath with exertion.   BNP mildly elevated at 175.3.   -Daily weights and strict I&Os -Furosemide 20 mg IV twice daily judiciously due to low blood pressure Add Kcl replacement Ongoing diuresing Net I&O -2.9L  Essential hypertension with Soft BP Maintain MAP>65 as possible On BB for rate control On diuretic for acute sCHF Continue to monitor vital signs  Elevated troponin likely demand ischemia:  Mildly elevated troponin of 29 and subsequently 38.  Denies anginal symptoms  Microcytic hypochromic anemia: Chronic.   Hemoglobin 9.5 on admission which appears improved from previous.  Last iron studies on 04/29/2019 noted iron, TIBC 160, and ferritin 1771. -Continue to monitor  S/p TAVR:  Recs per cards  Thrombocytopenia: Acute.  stable -Continue to monitor  BPH -On hold finasteride  GERD/achalasia:  -Continue outpatient follow-up with Dr. Therisa Doyne  DVT prophylaxis:  Eliquis Code Status: Full Family Communication:  will call to update Disposition Plan: Possible discharge home when cardiology signs off. Consults called: Cardiology   Objective: Vitals:   09/24/19  0518 09/24/19 0800 09/24/19 0813 09/24/19 1200  BP: 120/76  100/73   Pulse: 88  (!) 108   Resp: (!) 24  (!) 23   Temp:  (!) 97.3 F (36.3 C)  98.4 F (36.9 C)   TempSrc:  Oral  Oral  SpO2: 99%  97%   Weight:      Height:        Intake/Output Summary (Last 24 hours) at 09/24/2019 1503 Last data filed at 09/24/2019 0934 Gross per 24 hour  Intake 370 ml  Output 1400 ml  Net -1030 ml   Filed Weights   09/22/19 2328  Weight: 47.6 kg    Exam:  . General: 85 y.o. year-old male pleasant frail appearing in NAD . Cardiovascular: IRRR no rubs or gallops. Marland Kitchen Respiratory: CTA no wheezes or rales . Abdomen:Soft NT ND NBS . Musculoskeletal: 2+ pitting edema LE bilaterally Psychiatry: Mood is appropriate  Data Reviewed: CBC: Recent Labs  Lab 09/22/19 0749 09/23/19 0137 09/24/19 0118  WBC 6.5 6.8 8.0  NEUTROABS 4.1  --   --   HGB 9.5* 8.7* 9.5*  HCT 30.7* 28.5* 31.1*  MCV 72.2* 72.2* 71.2*  PLT 142* 146* 0000000   Basic Metabolic Panel: Recent Labs  Lab 09/22/19 0749 09/23/19 0137 09/24/19 0510  NA 141 140 139  K 3.6 4.2 3.5  CL 104 106 102  CO2 29 24 28   GLUCOSE 89 82 102*  BUN 19 23 29*  CREATININE 1.21 1.08 1.24  CALCIUM 9.0 8.9 8.9  MG 2.2  --   --    GFR: Estimated Creatinine Clearance: 30.9 mL/min (by C-G formula based on SCr of 1.24 mg/dL). Liver Function Tests: Recent Labs  Lab 09/22/19 0749  AST 27  ALT 17  ALKPHOS 64  BILITOT 1.0  PROT 6.0*  ALBUMIN 3.7   No results for input(s): LIPASE, AMYLASE in the last 168 hours. No results for input(s): AMMONIA in the last 168 hours. Coagulation Profile: No results for input(s): INR, PROTIME in the last 168 hours. Cardiac Enzymes: No results for input(s): CKTOTAL, CKMB, CKMBINDEX, TROPONINI in the last 168 hours. BNP (last 3 results) Recent Labs    03/18/19 1542 04/23/19 1432  PROBNP 3,511* 1,582*   HbA1C: No results for input(s): HGBA1C in the last 72 hours. CBG: No results for input(s): GLUCAP in the last 168 hours. Lipid Profile: No results for input(s): CHOL, HDL, LDLCALC, TRIG, CHOLHDL, LDLDIRECT in the last 72 hours. Thyroid Function Tests: Recent Labs     09/22/19 0749  TSH 2.108   Anemia Panel: No results for input(s): VITAMINB12, FOLATE, FERRITIN, TIBC, IRON, RETICCTPCT in the last 72 hours. Urine analysis:    Component Value Date/Time   COLORURINE YELLOW 09/22/2019 1543   APPEARANCEUR CLEAR 09/22/2019 1543   LABSPEC 1.019 09/22/2019 1543   PHURINE 6.0 09/22/2019 1543   GLUCOSEU NEGATIVE 09/22/2019 1543   HGBUR NEGATIVE 09/22/2019 1543   BILIRUBINUR NEGATIVE 09/22/2019 1543   KETONESUR 20 (A) 09/22/2019 1543   PROTEINUR 30 (A) 09/22/2019 1543   UROBILINOGEN 0.2 01/14/2008 0530   NITRITE NEGATIVE 09/22/2019 1543   LEUKOCYTESUR NEGATIVE 09/22/2019 1543   Sepsis Labs: @LABRCNTIP (procalcitonin:4,lacticidven:4)  ) Recent Results (from the past 240 hour(s))  Novel Coronavirus, NAA (Hosp order, Send-out to Ref Lab; TAT 18-24 hrs     Status: None   Collection Time: 09/18/19  9:45 AM   Specimen: Nasopharyngeal Swab; Respiratory  Result Value Ref Range Status   SARS-CoV-2, NAA NOT DETECTED NOT DETECTED Final  Comment: (NOTE) This nucleic acid amplification test was developed and its performance characteristics determined by [employer]. Nucleic acid amplification tests include PCR and TMA. This test has not been FDA cleared or approved. This test has been authorized by FDA under an Emergency Use Authorization (EUA). This test is only authorized for the duration of time the declaration that circumstances exist justifying the authorization of the emergency use of in vitro diagnostic tests for detection of SARS-CoV-2 virus and/or diagnosis of COVID-19 infection under section 564(b)(1) of the Act, 21 U.S.C. [id number]) (1), unless the authorization is terminated or revoked sooner. When diagnostic testing is negative, the possibility of a false negative result should be considered in the context of a patient's recent exposures and the presence of clinical signs and symptoms consistent with COVID-19. An individual  without symptoms of COVID- 19 and who is not shedding SARS-CoV-2 vi rus would expect to have a negative (not detected) result in this assay. Performed At: [facility] [address] [place], [place] [id number] Rush Farmer MD [id number]    [place]  Final    Comment: Performed at [facility], [place] [address] [place], [place] [address]  SARS CORONAVIRUS 2 (TAT 6-24 HRS) Nasopharyngeal Nasopharyngeal Swab     Status: None   Collection Time: 09/22/19 10:31 PM   Specimen: Nasopharyngeal Swab  Result Value Ref Range Status   SARS Coronavirus 2 NEGATIVE NEGATIVE Final    Comment: (NOTE) SARS-CoV-2 target nucleic acids are NOT DETECTED. The SARS-CoV-2 RNA is generally detectable in upper and lower respiratory specimens during the acute phase of infection. Negative results do not preclude SARS-CoV-2 infection, do not rule out co-infections with other pathogens, and should not be used as the sole basis for treatment or other patient management decisions. Negative results must be combined with clinical observations, patient history, and epidemiological information. The expected result is Negative. Fact Sheet for Patients: SugarRoll.be Fact Sheet for Healthcare Providers: https://www.woods-mathews.com/ This test is not yet approved or cleared by the [place] FDA and  has been authorized for detection and/or diagnosis of SARS-CoV-2 by FDA under an Emergency Use Authorization (EUA). This EUA will remain  in effect (meaning this test can be used) for the duration of the COVID-19 declaration under Section 56 4(b)(1) of the Act, 21 U.S.C. section 360bbb-3(b)(1), unless the authorization is terminated or revoked sooner. Performed at [facility], [place] [address], [place], [place] [address]   MRSA PCR Screening     Status: None   Collection Time: 09/22/19 10:57 PM   Specimen: Nasopharyngeal  Result  Value Ref Range Status   MRSA by PCR NEGATIVE NEGATIVE Final    Comment:        The GeneXpert MRSA Assay (FDA approved for NASAL specimens only), is one component of a comprehensive MRSA colonization surveillance program. It is not intended to diagnose MRSA infection nor to guide or monitor treatment for MRSA infections. Performed at [facility], [place] [address], [place], [place] [address]       Studies: No results found.  Scheduled Meds: . apixaban  2.5 mg Oral BID  . Chlorhexidine Gluconate Cloth  6 each Topical Daily  . folic acid  1 mg Oral Daily  . furosemide  20 mg Intravenous BID  . metoprolol tartrate  12.5 mg Oral BID  . pantoprazole  40 mg Oral QAC breakfast  . potassium chloride SA  20 mEq Oral Daily  . predniSONE  5 mg Oral  Q breakfast  . sodium chloride flush  3 mL Intravenous Q12H    Continuous Infusions:   LOS: 1 day     Kayleen Memos, MD [facility] Pager [phone]  If 7PM-7AM, please contact night-coverage [email or web address] Password [facility] 09/24/2019, 3:03 PM

## 2019-09-25 ENCOUNTER — Inpatient Hospital Stay (HOSPITAL_COMMUNITY): Payer: Medicare PPO

## 2019-09-25 ENCOUNTER — Inpatient Hospital Stay (HOSPITAL_COMMUNITY): Payer: Medicare PPO | Admitting: Anesthesiology

## 2019-09-25 ENCOUNTER — Encounter (HOSPITAL_COMMUNITY)
Admission: EM | Disposition: A | Discharge status: Home / Self Care | Payer: Self-pay | Source: Home / Self Care | Attending: Internal Medicine

## 2019-09-25 DIAGNOSIS — I340 Nonrheumatic mitral (valve) insufficiency: Secondary | ICD-10-CM

## 2019-09-25 DIAGNOSIS — I4891 Unspecified atrial fibrillation: Secondary | ICD-10-CM

## 2019-09-25 HISTORY — PX: TEE WITHOUT CARDIOVERSION: SHX5443

## 2019-09-25 HISTORY — PX: CARDIOVERSION: SHX1299

## 2019-09-25 LAB — BASIC METABOLIC PANEL
Anion gap: 11 (ref 5–15)
BUN: 35 mg/dL — ABNORMAL HIGH (ref 8–23)
CO2: 27 mmol/L (ref 22–32)
Calcium: 8.7 mg/dL — ABNORMAL LOW (ref 8.9–10.3)
Chloride: 99 mmol/L (ref 98–111)
Creatinine, Ser: 1.15 mg/dL (ref 0.61–1.24)
GFR calc Af Amer: >60 mL/min (ref >60)
GFR calc non Af Amer: 59 mL/min — ABNORMAL LOW (ref >60)
Glucose, Bld: 88 mg/dL (ref 70–99)
Potassium: 3.9 mmol/L (ref 3.5–5.1)
Sodium: 137 mmol/L (ref 135–145)

## 2019-09-25 LAB — CBC
HCT: 30.8 % — ABNORMAL LOW (ref 39.0–52.0)
Hemoglobin: 9.6 g/dL — ABNORMAL LOW (ref 13.0–17.0)
MCH: 22.2 pg — ABNORMAL LOW (ref 26.0–34.0)
MCHC: 31.2 g/dL (ref 30.0–36.0)
MCV: 71.3 fL — ABNORMAL LOW (ref 80.0–100.0)
Platelets: 145 10*3/uL — ABNORMAL LOW (ref 150–400)
RBC: 4.32 MIL/uL (ref 4.22–5.81)
RDW: 17.3 % — ABNORMAL HIGH (ref 11.5–15.5)
WBC: 7.2 10*3/uL (ref 4.0–10.5)
nRBC: 0.6 % — ABNORMAL HIGH (ref 0.0–0.2)

## 2019-09-25 LAB — PROTIME-INR
INR: 1.4 — ABNORMAL HIGH (ref 0.8–1.2)
Prothrombin Time: 17 seconds — ABNORMAL HIGH (ref 11.4–15.2)

## 2019-09-25 SURGERY — ECHOCARDIOGRAM, TRANSESOPHAGEAL
Anesthesia: General

## 2019-09-25 MED ORDER — LIDOCAINE 2% (20 MG/ML) 5 ML SYRINGE
INTRAMUSCULAR | Status: DC | PRN
  Administered 2019-09-25: 40 mg via INTRAVENOUS

## 2019-09-25 MED ORDER — METOPROLOL TARTRATE 25 MG PO TABS: 12.5000 mg | ORAL_TABLET | Freq: Two times a day (BID) | ORAL | 0 refills | Status: DC

## 2019-09-25 MED ORDER — APIXABAN 2.5 MG PO TABS: 2.5000 mg | ORAL_TABLET | Freq: Two times a day (BID) | ORAL | 0 refills | Status: DC

## 2019-09-25 MED ORDER — LIP MEDEX EX OINT
TOPICAL_OINTMENT | CUTANEOUS | Status: AC
  Filled 2019-09-25: qty 7

## 2019-09-25 MED ORDER — FUROSEMIDE 40 MG PO TABS: 40.0000 mg | ORAL_TABLET | Freq: Every day | ORAL | Status: DC

## 2019-09-25 MED ORDER — FUROSEMIDE 40 MG PO TABS: 40.0000 mg | ORAL_TABLET | Freq: Every day | ORAL | 0 refills | Status: DC

## 2019-09-25 MED ORDER — POTASSIUM CHLORIDE CRYS ER 20 MEQ PO TBCR: 20.0000 meq | EXTENDED_RELEASE_TABLET | Freq: Every day | ORAL | 0 refills | Status: DC

## 2019-09-25 MED ORDER — PROPOFOL 500 MG/50ML IV EMUL
INTRAVENOUS | Status: DC | PRN
  Administered 2019-09-25: 100 ug/kg/min via INTRAVENOUS

## 2019-09-25 NOTE — Discharge Instructions (Signed)
Atrial Fibrillation  Atrial fibrillation is a type of heartbeat that is irregular or fast. If you have this condition, your heart beats without any order. This makes it hard for your heart to pump blood in a normal way. Atrial fibrillation may come and go, or it may become a long-lasting problem. If this condition is not treated, it can put you at higher risk for stroke, heart failure, and other heart problems. What are the causes? This condition may be caused by diseases that damage the heart. They include:  High blood pressure.  Heart failure.  Heart valve disease.  Heart surgery. Other causes include:  Diabetes.  Thyroid disease.  Being overweight.  Kidney disease. Sometimes the cause is not known. What increases the risk? You are more likely to develop this condition if:  You are older.  You smoke.  You exercise often and very hard.  You have a family history of this condition.  You are a man.  You use drugs.  You drink a lot of alcohol.  You have lung conditions, such as emphysema, pneumonia, or COPD.  You have sleep apnea. What are the signs or symptoms? Common symptoms of this condition include:  A feeling that your heart is beating very fast.  Chest pain or discomfort.  Feeling short of breath.  Suddenly feeling light-headed or weak.  Getting tired easily during activity.  Fainting.  Sweating. In some cases, there are no symptoms. How is this treated? Treatment for this condition depends on underlying conditions and how you feel when you have atrial fibrillation. They include:  Medicines to: ? Prevent blood clots. ? Treat heart rate or heart rhythm problems.  Using devices, such as a pacemaker, to correct heart rhythm problems.  Doing surgery to remove the part of the heart that sends bad signals.  Closing an area where clots can form in the heart (left atrial appendage). In some cases, your doctor will treat other underlying  conditions. Follow these instructions at home: Medicines  Take over-the-counter and prescription medicines only as told by your doctor.  Do not take any new medicines without first talking to your doctor.  If you are taking blood thinners: ? Talk with your doctor before you take any medicines that have aspirin or NSAIDs, such as ibuprofen, in them. ? Take your medicine exactly as told by your doctor. Take it at the same time each day. ? Avoid activities that could hurt or bruise you. Follow instructions about how to prevent falls. ? Wear a bracelet that says you are taking blood thinners. Or, carry a card that lists what medicines you take. Lifestyle      Do not use any products that have nicotine or tobacco in them. These include cigarettes, e-cigarettes, and chewing tobacco. If you need help quitting, ask your doctor.  Eat heart-healthy foods. Talk with your doctor about the right eating plan for you.  Exercise regularly as told by your doctor.  Do not drink alcohol.  Lose weight if you are overweight.  Do not use drugs, including cannabis. General instructions  If you have a condition that causes breathing to stop for a short period of time (apnea), treat it as told by your doctor.  Keep a healthy weight. Do not use diet pills unless your doctor says they are safe for you. Diet pills may make heart problems worse.  Keep all follow-up visits as told by your doctor. This is important. Contact a doctor if:  You notice a  change in the speed, rhythm, or strength of your heartbeat.  You are taking a blood-thinning medicine and you get more bruising.  You get tired more easily when you move or exercise.  You have a sudden change in weight. Get help right away if:   You have pain in your chest or your belly (abdomen).  You have trouble breathing.  You have side effects of blood thinners, such as blood in your vomit, poop (stool), or pee (urine), or bleeding that cannot  stop.  You have any signs of a stroke. "BE FAST" is an easy way to remember the main warning signs: ? B - Balance. Signs are dizziness, sudden trouble walking, or loss of balance. ? E - Eyes. Signs are trouble seeing or a change in how you see. ? F - Face. Signs are sudden weakness or loss of feeling in the face, or the face or eyelid drooping on one side. ? A - Arms. Signs are weakness or loss of feeling in an arm. This happens suddenly and usually on one side of the body. ? S - Speech. Signs are sudden trouble speaking, slurred speech, or trouble understanding what people say. ? T - Time. Time to call emergency services. Write down what time symptoms started.  You have other signs of a stroke, such as: ? A sudden, very bad headache with no known cause. ? Feeling like you may vomit (nausea). ? Vomiting. ? A seizure. These symptoms may be an emergency. Do not wait to see if the symptoms will go away. Get medical help right away. Call your local emergency services (911 in the [place] Do not drive yourself to the hospital. Summary  Atrial fibrillation is a type of heartbeat that is irregular or fast.  You are at higher risk of this condition if you smoke, are older, have diabetes, or are overweight.  Follow your doctor's instructions about medicines, diet, exercise, and follow-up visits.  Get help right away if you have signs or symptoms of a stroke.  Get help right away if you cannot catch your breath, or you have chest pain or discomfort. This information is not intended to replace advice given to you by your health care provider. Make sure you discuss any questions you have with your health care provider. Document Revised: 02/12/2019 Document Reviewed: 02/12/2019 Elsevier Patient Education  [place]. Atrial Flutter  Atrial flutter is a type of abnormal heart rhythm (arrhythmia). The heart has an electrical system that tells it how to beat. In atrial flutter, the signals move  rapidly in the top chambers of the heart (the atria). This makes your heart beat very fast. Atrial flutter can come and go, or it can be permanent. The goal of treatment is to prevent blood clots from forming, control your heart rate, or restore your heartbeat to a normal rhythm. If this condition is not treated, it can cause serious problems, such as a weakened heart muscle (cardiomyopathy) or a stroke. What are the causes? This condition is often caused by conditions that damage the heart's electrical system. These include:  Heart conditions and heart surgery. These include heart attacks and open-heart surgery.  Lung problems, such as COPD or a blood clot in the lung (pulmonary embolism, or PE).  Poorly controlled high blood pressure (hypertension).  Overactive thyroid (hyperthyroidism).  Diabetes. In some cases, the cause of this condition is not known. What increases the risk? You are more likely to develop this condition if:  You are  an elderly adult.  You are a man.  You are overweight (obese).  You have obstructive sleep apnea.  You have a family history of atrial flutter.  You have diabetes.  You drink a lot of alcohol, especially binge drinking.  You use drugs, including cannabis.  You smoke. What are the signs or symptoms? Symptoms of this condition include:  A feeling that your heart is pounding or racing (palpitations).  Shortness of breath.  Chest pain.  Feeling dizzy or light-headed.  Fainting.  Low blood pressure (hypotension).  Fatigue.  Tiring easily during exercise or activity. In some cases, there are no symptoms. How is this diagnosed? This condition may be diagnosed with:  An electrocardiogram (ECG) to check electrical signals of the heart.  An ambulatory cardiac monitor to record your heart's activity for a few days.  An echocardiogram to create pictures of your heart.  A transesophageal echocardiogram (TEE) to create even better  pictures of your heart.  A stress test to check your blood supply while you exercise.  Imaging tests, such as a CT scan or chest X-ray.  Blood tests. How is this treated? Treatment depends on underlying conditions and how you feel when you experience atrial flutter. This condition may be treated with:  Medicines to prevent blood clots or to treat heart rate or heart rhythm problems.  Electrical cardioversion to reset the heart's rhythm.  Ablation to remove the heart tissue that sends abnormal signals.  Left atrial appendage closure to seal the area where blood clots can form. In some cases, underlying conditions will be treated. Follow these instructions at home: Medicines  Take over-the-counter and prescription medicines only as told by your health care provider.  Do not take any new medicines without talking to your health care provider.  If you are taking blood thinners: ? Talk with your health care provider before you take any medicines that contain aspirin or NSAIDs, such as ibuprofen. These medicines increase your risk for dangerous bleeding. ? Take your medicine exactly as told, at the same time every day. ? Avoid activities that could cause injury or bruising, and follow instructions about how to prevent falls. ? Wear a medical alert bracelet or carry a card that lists what medicines you take. Lifestyle  Eat heart-healthy foods. Talk with a dietitian to make an eating plan that is right for you.  Do not use any products that contain nicotine or tobacco, such as cigarettes, e-cigarettes, and chewing tobacco. If you need help quitting, ask your health care provider.  Do not drink alcohol.  Do not use drugs, including cannabis.  Lose weight if you are overweight or obese.  Exercise regularly as instructed by your health care provider. General instructions  Do not use diet pills unless your health care provider approves. Diet pills may make heart problems worse.  If  you have obstructive sleep apnea, manage your condition as told by your health care provider.  Keep all follow-up visits as told by your health care provider. This is important. Contact a health care provider if you:  Notice a change in the rate, rhythm, or strength of your heartbeat.  Are taking a blood thinner and you notice more bruising.  Have a sudden change in weight.  Tire more easily when you exercise or do heavy work. Get help right away if you have:  Pain or pressure in your chest.  Shortness of breath.  Fainting.  Increasing sweating with no known cause.  Side effects of blood  thinners, such as blood in your vomit, stool, or urine, or bleeding that cannot stop.  Any symptoms of a stroke. "BE FAST" is an easy way to remember the main warning signs of a stroke: ? B - Balance. Signs are dizziness, sudden trouble walking, or loss of balance. ? E - Eyes. Signs are trouble seeing or a sudden change in vision. ? F - Face. Signs are sudden weakness or numbness of the face, or the face or eyelid drooping on one side. ? A - Arms. Signs are weakness or numbness in an arm. This happens suddenly and usually on one side of the body. ? S - Speech. Signs are sudden trouble speaking, slurred speech, or trouble understanding what people say. ? T - Time. Time to call emergency services. Write down what time symptoms started.  Other signs of a stroke, such as: ? A sudden, severe headache with no known cause. ? Nausea or vomiting. ? Seizure.  These symptoms may represent a serious problem that is an emergency. Do not wait to see if the symptoms will go away. Get medical help right away. Call your local emergency services (911 in the [place] Do not drive yourself to the hospital. Summary  Atrial flutter is an abnormal heart rhythm that can give you symptoms of palpitations, shortness of breath, or fatigue.  Atrial flutter is often treated with medicines to keep your heart in a normal  rhythm and to prevent a stroke.  Get help right away if you cannot catch your breath, or have chest pain or pressure.  Get help right away if you have signs or symptoms of a stroke. This information is not intended to replace advice given to you by your health care provider. Make sure you discuss any questions you have with your health care provider. Document Revised: 02/12/2019 Document Reviewed: 02/12/2019 Elsevier Patient Education  [place] on my medicine - ELIQUIS (apixaban)  This medication education was reviewed with me or my healthcare representative as part of my discharge preparation.   Why was Eliquis prescribed for you? Eliquis was prescribed for you to reduce the risk of a blood clot forming that can cause a stroke if you have a medical condition called atrial fibrillation (a type of irregular heartbeat).  What do You need to know about Eliquis ? Take your Eliquis TWICE DAILY - one tablet in the morning and one tablet in the evening with or without food. If you have difficulty swallowing the tablet whole please discuss with your pharmacist how to take the medication safely.  Take Eliquis exactly as prescribed by your doctor and DO NOT stop taking Eliquis without talking to the doctor who prescribed the medication.  Stopping may increase your risk of developing a stroke.  Refill your prescription before you run out.  After discharge, you should have regular check-up appointments with your healthcare provider that is prescribing your Eliquis.  In the future your dose may need to be changed if your kidney function or weight changes by a significant amount or as you get older.  What do you do if you miss a dose? If you miss a dose, take it as soon as you remember on the same day and resume taking twice daily.  Do not take more than one dose of ELIQUIS at the same time to make up a missed dose.  Important Safety Information A possible side effect of  Eliquis is bleeding. You should call your healthcare provider right away if  you experience any of the following: ? Bleeding from an injury or your nose that does not stop. ? Unusual colored urine (red or dark brown) or unusual colored stools (red or black). ? Unusual bruising for unknown reasons. ? A serious fall or if you hit your head (even if there is no bleeding).  Some medicines may interact with Eliquis and might increase your risk of bleeding or clotting while on Eliquis. To help avoid this, consult your healthcare provider or pharmacist prior to using any new prescription or non-prescription medications, including herbals, vitamins, non-steroidal anti-inflammatory drugs (NSAIDs) and supplements.  This website has more information on Eliquis (apixaban): http://www.eliquis.com/eliquis/home

## 2019-09-25 NOTE — Progress Notes (Signed)
Phone calls from [facility] and [facility] confirmed change in schedule to [facility] pick up at 1130 and procedure at 1pm.  Pt updated.

## 2019-09-25 NOTE — Transfer of Care (Signed)
Immediate Anesthesia Transfer of Care Note  Patient: Joshua Hudson  Procedure(s) Performed: TRANSESOPHAGEAL ECHOCARDIOGRAM (TEE) (N/A ) CARDIOVERSION (N/A )  Patient Location: Endoscopy Unit  Anesthesia Type:MAC  Level of Consciousness: awake, alert  and oriented  Airway & Oxygen Therapy: Patient Spontanous Breathing and Patient connected to nasal cannula oxygen  Post-op Assessment: Report given to RN and Post -op Vital signs reviewed and stable  Post vital signs: Reviewed and stable  Last Vitals:  Vitals Value Taken Time  BP 94/60 09/25/19 0931  Temp    Pulse 56 09/25/19 0932  Resp 17 09/25/19 0932  SpO2 99 % 09/25/19 0932  Vitals shown include unvalidated device data.  Last Pain:  Vitals:   09/25/19 0815  TempSrc: Oral  PainSc: 0-No pain         Complications: No apparent anesthesia complications

## 2019-09-25 NOTE — Progress Notes (Signed)
[facility] called, when they called [facility] there was no answer so they are watiting to transfer patient closer to 0700 when department is opened.  Pt updated

## 2019-09-25 NOTE — Progress Notes (Signed)
Pt given discharge instructions and all questions answered. IV discontinued and is CDI. Escorted out to vehicle via wheelchair.

## 2019-09-25 NOTE — Progress Notes (Signed)
[facility] called, endo confirmed need to proceed with procedure this morning.  They are en route to pick up patient.  Patient made aware and he notified his daughter of the updated plans for procedure.

## 2019-09-25 NOTE — Discharge Summary (Signed)
Discharge Summary  Joshua Hudson [name] DOB: April 02, 1935  PCP: Lavone Orn, MD  Admit date: 09/22/2019 Discharge date: 09/25/2019  Time spent: 35 minutes  Recommendations for Outpatient Follow-up:  1. Follow up with your cardiologist 2. Follow up with your PCP 3. Take your medications as prescribed 4. Fall precautions  Discharge Diagnoses:  Active Hospital Problems   Diagnosis Date Noted  . New onset atrial flutter ([place]) 09/22/2019  . Acute systolic (congestive) heart failure ([place]) 09/22/2019  . Elevated troponin 09/22/2019  . Microcytic hypochromic anemia 09/22/2019  . Thrombocytopenia ([place]) 05/20/2019  . GERD (gastroesophageal reflux disease)   . History of esophageal stricture   . S/P valve-in-valve TAVR     Resolved Hospital Problems  No resolved problems to display.    Discharge Condition: Stable   Diet recommendation: Heart healthy diet   Vitals:   09/25/19 1025 09/25/19 1155  BP: 114/63 112/68  Pulse: (!) 57 (!) 55  Resp: 20 19  Temp:    SpO2:  100%    History of present illness:  Joshua R Barhamis a 84 y.o.malewith medical history significant ofhypertension, aortic insufficiency S/P tavr,esophageal stricture, and GERD.Patient had initially presented to endoscopy clinic to undergo treatment for achalasia with Dr. Therisa Doyne, but was found to be in atrial fibrillation with RVR rate in the 150s. Associated with palpitations, dizziness with standing, shortness of breath with exertion, and intermittent leg swelling. No chest pain or loss of consciousness. Covid 19 screening test negative.  Seen by cardiology, was started on Eliquis.  Initially on cardizem drip which was discontinued.  Diuresed with net I&O -3.9L.  Was started on Lopressor.  Due to persistent rapid ventricular rate, TEE/DCCV was planned after 5 doses of Eliquis.  Successfully cardioverted on 09/25/19.  Ok to discharge from a cardiac standpoint.  09/25/19: Seen and examined.  Successfully  cardioverted.  Denies any chest pain, palpitations or dyspnea.  He wants to go home.  Vital signs post cardioversion reviewed and are stable.  Labs also reviewed and are stable.  On the day of discharge, the patient was hemodynamically stable.  He will need to follow-up with his cardiologist and PCP posthospitalization.  Patient understands and agrees to plan.  Hospital Course:  Principal Problem:   New onset atrial flutter (HCC) Active Problems:   S/P valve-in-valve TAVR   GERD (gastroesophageal reflux disease)   History of esophageal stricture   Thrombocytopenia (HCC)   Acute systolic (congestive) heart failure (HCC)   Elevated troponin   Microcytic hypochromic anemia  Newly onset atrial fibrillation with RVR post cardioversion  Patient presents in atrial fibrillation with heart rates into the 150s. Patient was given 10 mg of diltiazem and then started on a diltiazem drip. Records noted prior history of postoperative atrial fibrillation but he was not on anticoagulation. CHA2DS2-VASc score =6. -Goal magnesium 2 and potassium 4 -TSH WNL -2D echo LVEF 45-50% Left Atrium: Left atrial size was moderately dilated. Right Atrium: Right atrial size was moderately dilated Started on eliquis by cards for primary CVA prevention Diltiazem drip discontinued On po lopressor 12.5 mg BID Completed TEE/DCCV on Thursday, 09/25/2019 after 5 doses of eliquis. Successfully cardioverted on 09/25/2019: Maintaining sinus with PACs.  Okay to discharge per cardiology.  Per cardiology: D/w cardiac EP, may be a candidate for outpatient flutter ablation - will arrange a follow-up appt. Follow-up with your cardiologist  Acute Systolic heart failure:  Patient reports having lower extremity swelling and progressively worsening shortness of breath with exertion.  BNP mildly  elevated at 175.3.  -Daily weights and strict I&Os Net I&O -3.9 L.  Diuresed with IV Lasix 20 mg twice daily. Switch to oral Lasix 40 mg  daily by cardiology. Continue potassium supplement while on Lasix. Follow-up with your cardiologist.  Bilateral lower extremity edema Much improved after diuresing. Follow-up with cards  Essential hypertension with Soft BP Blood pressures are stable.  Last blood pressure 115/58 with a MAP of 76.  Elevated troponin likely demand ischemia:  Mildly elevated troponin of 29 and subsequently 38.  Denies anginal symptoms  Microcytic hypochromic anemia: Chronic.  Hemoglobin 9.5 on admission which appears improved from previous. Last iron studies on 04/29/2019 noted iron, TIBC 160, andferritin 1771. Follow-up with your primary care provider  S/p TAVR: Follow-up with your cardiologist  Thrombocytopenia: Acute. stable -Continue to monitor  BPH Continue home medication  GERD/achalasia: -Continue outpatient follow-up with Dr. Therisa Doyne  DVT prophylaxis: Eliquis Code Status:Full  Consults called:Cardiology  Discharge Exam: BP 112/68   Pulse (!) 55   Temp (!) 97.3 F (36.3 C) (Temporal)   Resp 19   Ht 5\' 2"  (1.575 m)   Wt 47.6 kg   SpO2 100%   BMI 19.19 kg/m  . General: 85 y.o. year-old male well developed well nourished in no acute distress.  Alert and oriented x3. . Cardiovascular: This morning prior to cardioversion, irregular rate and rhythm. Marland Kitchen Respiratory: Clear to auscultation with no wheezes or rales. Good inspiratory effort. . Abdomen: Soft nontender nondistended with normal bowel sounds x4 quadrants. . Musculoskeletal: Trace lower extremity edema bilaterally. Marland Kitchen Psychiatry: Mood is appropriate for condition and setting  Discharge Instructions You were cared for by a hospitalist during your hospital stay. If you have any questions about your discharge medications or the care you received while you were in the hospital after you are discharged, you can call the unit and asked to speak with the hospitalist on call if the hospitalist that took care of you is  not available. Once you are discharged, your primary care physician will handle any further medical issues. Please note that NO REFILLS for any discharge medications will be authorized once you are discharged, as it is imperative that you return to your primary care physician (or establish a relationship with a primary care physician if you do not have one) for your aftercare needs so that they can reassess your need for medications and monitor your lab values.  Discharge Instructions    Amb referral to AFIB Clinic   Complete by: As directed      Allergies as of 09/25/2019   No Known Allergies     Medication List    STOP taking these medications   ALPRAZolam 0.25 MG tablet Commonly known as: XANAX   HYDROcodone-acetaminophen 5-325 MG tablet Commonly known as: Norco   metoprolol succinate 25 MG 24 hr tablet Commonly known as: TOPROL-XL     TAKE these medications   apixaban 2.5 MG Tabs tablet Commonly known as: ELIQUIS Take 1 tablet (2.5 mg total) by mouth 2 (two) times daily.   feeding supplement (ENSURE ENLIVE) Liqd Take 237 mLs by mouth 3 (three) times daily between meals.   finasteride 5 MG tablet Commonly known as: PROSCAR Take 5 mg by mouth daily as needed (retention).   folic acid 1 MG tablet Commonly known as: FOLVITE Take 1 mg by mouth daily.   furosemide 40 MG tablet Commonly known as: LASIX Take 1 tablet (40 mg total) by mouth daily. Start taking on: September 26, 2019   metoprolol tartrate 25 MG tablet Commonly known as: LOPRESSOR Take 0.5 tablets (12.5 mg total) by mouth 2 (two) times daily.   multivitamin with minerals Tabs tablet Take 1 tablet by mouth daily.   oxymetazoline 0.05 % nasal spray Commonly known as: AFRIN Place 1 spray into both nostrils 2 (two) times daily as needed for congestion.   pantoprazole 40 MG tablet Commonly known as: PROTONIX Take 40 mg by mouth daily before breakfast.   potassium chloride SA 20 MEQ tablet Commonly known  as: KLOR-CON Take 1 tablet (20 mEq total) by mouth daily. Start taking on: September 26, 2019   predniSONE 5 MG tablet Commonly known as: DELTASONE Take 5 mg by mouth daily with breakfast.   Theratears 0.25 % Soln Generic drug: Carboxymethylcellulose Sodium Place 1 drop into both eyes 3 (three) times daily as needed (dry/irritated eyes.).      No Known Allergies Follow-up Information    Imogene Burn, PA-C. Go on 10/22/2019.   Specialty: Cardiology Why: @10 :45am for hospital follow up with Dr. Theodosia Blender PA Contact information: [address] [place] [address] [phone]        Evans Lance, MD Follow up.   Specialty: Cardiology Why: 10/06/19 @ 09:00AM, consult to discuss heart rhythm management options Contact information: [address] [place] [address] [phone]        Lavone Orn, MD. Call in 1 day(s).   Specialty: Internal Medicine Why: Please call for a post hospital follow up appointment Contact information: [address] [place] [address] [phone]        Sueanne Margarita, MD .   Specialty: Cardiology Contact information: [phone] [address] [place] [address] [phone]            The results of significant diagnostics from this hospitalization (including imaging, microbiology, ancillary and laboratory) are listed below for reference.    Significant Diagnostic Studies: DG Chest Portable 1 View  Result Date: 09/22/2019 CLINICAL DATA:  New atrial fibrillation. EXAM: PORTABLE CHEST 1 VIEW COMPARISON:  Chest radiograph 04/28/2019 FINDINGS: Heart size at the upper limits of normal. Status post TAVR and prior median sternotomy. Aortic atherosclerosis. There is no airspace consolidation. No evidence of pleural effusion or pneumothorax. No acute bony abnormality. Right shoulder prosthesis. Overlying cardiac monitoring leads. IMPRESSION: Heart size at the upper limits  of normal. Status post TAVR and median sternotomy. No airspace consolidation or pulmonary edema. Aortic atherosclerosis. Electronically Signed   By: Kellie Simmering DO   On: 09/22/2019 08:13   ECHOCARDIOGRAM COMPLETE  Result Date: 09/22/2019   ECHOCARDIOGRAM REPORT   Patient Name:   Joshua Hudson Date of Exam: 09/22/2019 Medical Rec #:  [id number]       Height:       62.0 in Accession #:    [id number]      Weight:       105.0 lb Date of Birth:  22-Jun-1935       BSA:          1.45 m Patient Age:    31 years        BP:           87/59 mmHg Patient Gender: M               HR:           99 bpm. Exam Location:  Inpatient Procedure: 2D Echo, Cardiac Doppler and  Color Doppler Indications:    I48.0 Paroxysmal atrial fibrillation  History:        Patient has prior history of Echocardiogram examinations, most                 recent 05/14/2019. CHF, Aortic Valve Disease; Risk                 Factors:Hypertension and GERD.  Sonographer:    Tiffany Dance Referring Phys: [id number] RONDELL A SMITH IMPRESSIONS  1. Left ventricular ejection fraction, by visual estimation, is 45 to 50%. The left ventricle has mildly decreased function. There is no left ventricular hypertrophy.  2. Left ventricular diastolic parameters are indeterminate.  3. The left ventricle demonstrates global hypokinesis.  4. Global right ventricle has mildly reduced systolic function.The right ventricular size is normal. No increase in right ventricular wall thickness.  5. Left atrial size was moderately dilated.  6. Right atrial size was moderately dilated.  7. Moderate mitral annular calcification.  8. The mitral valve is normal in structure. Trivial mitral valve regurgitation. No evidence of mitral stenosis.  9. The tricuspid valve is normal in structure. Tricuspid valve regurgitation is mild. 10. The patient had initial surgical bioprosthetic aortic valve followed by valve-in-valve TAVR with 26 mm Edwards Sapien Ultra THV. The gradient across the valve was not  measured. There appeared to be trivial peri-valvular regurgitation. 11. The inferior vena cava is normal in size with greater than 50% respiratory variability, suggesting right atrial pressure of 3 mmHg. 12. The tricuspid regurgitant velocity is 2.28 m/s, and with an assumed right atrial pressure of 3 mmHg, the estimated right ventricular systolic pressure is normal at 23.8 mmHg. 13. The patient was in atrial fibrillation. FINDINGS  Left Ventricle: Left ventricular ejection fraction, by visual estimation, is 45 to 50%. The left ventricle has mildly decreased function. The left ventricle demonstrates global hypokinesis. The left ventricular internal cavity size was the left ventricle is normal in size. There is no left ventricular hypertrophy. Left ventricular diastolic parameters are indeterminate. Right Ventricle: The right ventricular size is normal. No increase in right ventricular wall thickness. Global RV systolic function is has mildly reduced systolic function. The tricuspid regurgitant velocity is 2.28 m/s, and with an assumed right atrial pressure of 3 mmHg, the estimated right ventricular systolic pressure is normal at 23.8 mmHg. Left Atrium: Left atrial size was moderately dilated. Right Atrium: Right atrial size was moderately dilated Pericardium: There is no evidence of pericardial effusion. Mitral Valve: The mitral valve is normal in structure. There is mild calcification of the mitral valve leaflet(s). Moderate mitral annular calcification. Trivial mitral valve regurgitation. No evidence of mitral valve stenosis by observation. Tricuspid Valve: The tricuspid valve is normal in structure. Tricuspid valve regurgitation is mild. Aortic Valve: The aortic valve has been repaired/replaced. Aortic valve regurgitation is trivial. 26 Edwards Edwards Sapien bioprosthetic, stented aortic valve (TAVR) valve is present in the aortic position. The patient had initial surgical bioprosthetic  aortic valve followed by  valve-in-valve TAVR with 26 mm Edwards Sapien Ultra THV. Pulmonic Valve: The pulmonic valve was normal in structure. Pulmonic valve regurgitation is trivial. Pulmonic regurgitation is trivial. Aorta: The aortic root is normal in size and structure. Venous: The inferior vena cava is normal in size with greater than 50% respiratory variability, suggesting right atrial pressure of 3 mmHg. IAS/Shunts: No atrial level shunt detected by color flow Doppler.  LEFT VENTRICLE PLAX 2D LVIDd:         4.00 cm  LVIDs:         2.70 cm LV PW:         1.10 cm LV IVS:        1.00 cm LVOT diam:     1.80 cm LV SV:         43 ml LV SV Index:   29.85 LVOT Area:     2.54 cm  RIGHT VENTRICLE            IVC RV Basal diam:  3.10 cm    IVC diam: 1.50 cm RV Mid diam:    2.10 cm RV S prime:     6.74 cm/s TAPSE (M-mode): 1.3 cm LEFT ATRIUM             Index       RIGHT ATRIUM           Index LA diam:        4.50 cm 3.10 cm/m  RA Area:     23.60 cm LA Vol (A2C):   76.2 ml 52.42 ml/m RA Volume:   72.30 ml  49.73 ml/m LA Vol (A4C):   50.8 ml 34.94 ml/m LA Biplane Vol: 65.9 ml 45.33 ml/m  AORTIC VALVE LVOT Vmax:   71.10 cm/s LVOT Vmean:  45.450 cm/s LVOT VTI:    0.133 m  AORTA Ao Root diam: 2.80 cm Ao Asc diam:  3.20 cm MITRAL VALVE                        TRICUSPID VALVE MV Area (PHT): 4.40 cm             TR Peak grad:   20.8 mmHg MV PHT:        50.02 msec           TR Vmax:        228.00 cm/s MV Decel Time: 173 msec MV E velocity: 119.50 cm/s 103 cm/s SHUNTS                                     Systemic VTI:  0.13 m                                     Systemic Diam: 1.80 cm  Loralie Champagne MD Electronically signed by Loralie Champagne MD Signature Date/Time: 09/22/2019/3:04:39 PM    Final    ECHO TEE  Result Date: 09/25/2019   TRANSESOPHOGEAL ECHO REPORT   Patient Name:   Joshua Hudson Date of Exam: 09/25/2019 Medical Rec #:  [id number]       Height:       62.0 in Accession #:    [id number]      Weight:       104.9 lb Date of Birth:  Jan 16, 1935        BSA:          1.45 m Patient Age:    45 years        BP:           110/53 mmHg Patient Gender: M               HR:           113 bpm. Exam Location:  Inpatient  Procedure: 3D Echo, Transesophageal Echo, Cardiac Doppler and Color Doppler Indications:  I48.91* Unspeicified atrial fibrillation  History:        Patient has prior history of Echocardiogram examinations, most                 recent 09/22/2019. Elevated troponin.  Sonographer:    Roseanna Rainbow RDCS Referring Phys: [place] Comments: No transgastric images could be obtained.  PROCEDURE: The TEE was followed by a successful Cardioversion. Consent was requested emergently by emergency room physicain. Patients was monitored while under deep sedation Anesthestetic sedation was provided intravenously by Anesthesiology: 76mg  of Propofol. The transesophogeal probe was passed through the esophogus of the patient. Imaged were obtained with the patient in a left lateral decubitus position. The patient's vital signs; including heart rate, blood pressure, and oxygen saturation; remained stable throughout the procedure. The patient developed no complications during the procedure. IMPRESSIONS  1. Left ventricular ejection fraction, by visual estimation, is 40 to 45%. The left ventricle has moderately decreased function. There is no left ventricular hypertrophy.  2. The left ventricle demonstrates global hypokinesis.  3. Global right ventricle has normal systolic function.The right ventricular size is normal. No increase in right ventricular wall thickness.  4. Left atrial size was normal.  5. No LAA thrombus.  6. Right atrial size was normal.  7. The mitral valve is normal in structure. Mild mitral valve regurgitation. No evidence of mitral stenosis.  8. The tricuspid valve is normal in structure.  9. The tricuspid valve is normal in structure. Tricuspid valve regurgitation is moderate. 10. Aortic valve regurgitation is not visualized. No  evidence of aortic valve sclerosis or stenosis. 11. Valve in valve procedure 03/2019. No perivalvular leak.     There is a crescentric space in the 9 to 1 o'clock position (along intraatrial septum-what was the non-coronary cusp) 0.6cm in width, 2.7 in approximate length that does not have any flow (Color Dopper negative). This space was present on pre-op TEE during valve in valve procedure but does not appear to be as prominently visualized in post op transthroacic echocardiograms. Discussed with referring cardiologist. 12. The pulmonic valve was normal in structure. Pulmonic valve regurgitation is not visualized. 13. Normal pulmonary artery systolic pressure. 14. The inferior vena cava is normal in size with greater than 50% respiratory variability, suggesting right atrial pressure of 3 mmHg. 15. Did not attempt transgastric views secondary to stricure. FINDINGS  Left Ventricle: Left ventricular ejection fraction, by visual estimation, is 40 to 45%. The left ventricle has moderately decreased function. The left ventricle demonstrates global hypokinesis. There is no left ventricular hypertrophy. Normal left atrial pressure. Right Ventricle: The right ventricular size is normal. No increase in right ventricular wall thickness. Global RV systolic function is has normal systolic function. The tricuspid regurgitant velocity is 2.16 m/s, and with an assumed right atrial pressure  of 10 mmHg, the estimated right ventricular systolic pressure is normal at 28.7 mmHg. Left Atrium: Left atrial size was normal in size. No LAA thrombus. Right Atrium: Right atrial size was normal in size Pericardium: There is no evidence of pericardial effusion. Mitral Valve: The mitral valve is normal in structure. No evidence of mitral valve stenosis by observation. Mild mitral valve regurgitation. Tricuspid Valve: The tricuspid valve is normal in structure. Tricuspid valve regurgitation is moderate. Aortic Valve: The aortic valve has been  repaired/replaced. Aortic valve regurgitation is not visualized. The aortic valve is structurally normal, with no evidence of sclerosis or stenosis. Valve in valve procedure 03/2019. No perivalvular leak. There  is a crescentric space in the 9 to 1 o'clock position (along intraatrial septum-what was the non-coronary cusp) 0.6cm in width, 2.7 in approximate length that does not have any flow (Color Dopper negative). This space was present on pre-op TEE during valve in valve procedure but does not appear to be as prominently visualized in post op transthroacic echocardiograms. Discussed with referring cardiologist. Pulmonic Valve: The pulmonic valve was normal in structure. Pulmonic valve regurgitation is not visualized. Aorta: The aortic root, ascending aorta and aortic arch are all structurally normal, with no evidence of dilitation or obstruction. Venous: The inferior vena cava is normal in size with greater than 50% respiratory variability, suggesting right atrial pressure of 3 mmHg. Shunts: There is no evidence of a patent foramen ovale. No ventricular septal defect is seen or detected. There is no evidence of an atrial septal defect. No atrial level shunt detected by color flow Doppler.  TRICUSPID VALVE             Normals TR Peak grad:   18.7 mmHg TR Vmax:        216.00 cm/s 288 cm/s  Candee Furbish MD Electronically signed by Candee Furbish MD Signature Date/Time: 09/25/2019/10:55:09 AM    Final     Microbiology: Recent Results (from the past 240 hour(s))  Novel Coronavirus, NAA (Hosp order, Send-out to Ref Lab; TAT 18-24 hrs     Status: None   Collection Time: 09/18/19  9:45 AM   Specimen: Nasopharyngeal Swab; Respiratory  Result Value Ref Range Status   SARS-CoV-2, NAA NOT DETECTED NOT DETECTED Final    Comment: (NOTE) This nucleic acid amplification test was developed and its performance characteristics determined by [employer]. Nucleic acid amplification tests include PCR and TMA. This test  has not been FDA cleared or approved. This test has been authorized by FDA under an Emergency Use Authorization (EUA). This test is only authorized for the duration of time the declaration that circumstances exist justifying the authorization of the emergency use of in vitro diagnostic tests for detection of SARS-CoV-2 virus and/or diagnosis of COVID-19 infection under section 564(b)(1) of the Act, 21 U.S.C. [id number]) (1), unless the authorization is terminated or revoked sooner. When diagnostic testing is negative, the possibility of a false negative result should be considered in the context of a patient's recent exposures and the presence of clinical signs and symptoms consistent with COVID-19. An individual without symptoms of COVID- 19 and who is not shedding SARS-CoV-2 vi rus would expect to have a negative (not detected) result in this assay. Performed At: [facility] [address] [place], [place] [id number] Rush Farmer MD [id number]    [place]  Final    Comment: Performed at [facility], [place] [address] [place], [place] [address]  SARS CORONAVIRUS 2 (TAT 6-24 HRS) Nasopharyngeal Nasopharyngeal Swab     Status: None   Collection Time: 09/22/19 10:31 PM   Specimen: Nasopharyngeal Swab  Result Value Ref Range Status   SARS Coronavirus 2 NEGATIVE NEGATIVE Final    Comment: (NOTE) SARS-CoV-2 target nucleic acids are NOT DETECTED. The SARS-CoV-2 RNA is generally detectable in upper and lower respiratory specimens during the acute phase of infection. Negative results do not preclude SARS-CoV-2 infection, do not rule out co-infections with other pathogens, and should not be used as the sole basis for treatment or other patient management decisions. Negative results must be combined with clinical observations, patient history, and epidemiological information. The expected result is Negative. Fact  Sheet for  Patients: SugarRoll.be Fact Sheet for Healthcare Providers: https://www.woods-mathews.com/ This test is not yet approved or cleared by the [place] FDA and  has been authorized for detection and/or diagnosis of SARS-CoV-2 by FDA under an Emergency Use Authorization (EUA). This EUA will remain  in effect (meaning this test can be used) for the duration of the COVID-19 declaration under Section 56 4(b)(1) of the Act, 21 U.S.C. section 360bbb-3(b)(1), unless the authorization is terminated or revoked sooner. Performed at [facility], [place] [address] [place], [place] [address]   MRSA PCR Screening     Status: None   Collection Time: 09/22/19 10:57 PM   Specimen: Nasopharyngeal  Result Value Ref Range Status   MRSA by PCR NEGATIVE NEGATIVE Final    Comment:        The GeneXpert MRSA Assay (FDA approved for NASAL specimens only), is one component of a comprehensive MRSA colonization surveillance program. It is not intended to diagnose MRSA infection nor to guide or monitor treatment for MRSA infections. Performed at [facility], [place] [address], [place],  [address]      Labs: Basic Metabolic Panel: Recent Labs  Lab 09/22/19 0749 09/23/19 0137 09/24/19 0510 09/25/19 0131  NA 141 140 139 137  K 3.6 4.2 3.5 3.9  CL 104 106 102 99  CO2 29 24 28 27   GLUCOSE 89 82 102* 88  BUN 19 23 29* 35*  CREATININE 1.21 1.08 1.24 1.15  CALCIUM 9.0 8.9 8.9 8.7*  MG 2.2  --   --   --    Liver Function Tests: Recent Labs  Lab 09/22/19 0749  AST 27  ALT 17  ALKPHOS 64  BILITOT 1.0  PROT 6.0*  ALBUMIN 3.7   No results for input(s): LIPASE, AMYLASE in the last 168 hours. No results for input(s): AMMONIA in the last 168 hours. CBC: Recent Labs  Lab 09/22/19 0749 09/23/19 0137 09/24/19 0118 09/25/19 0131  WBC 6.5 6.8 8.0 7.2  NEUTROABS 4.1  --   --   --   HGB 9.5* 8.7* 9.5* 9.6*  HCT 30.7* 28.5*  31.1* 30.8*  MCV 72.2* 72.2* 71.2* 71.3*  PLT 142* 146* 181 145*   Cardiac Enzymes: No results for input(s): CKTOTAL, CKMB, CKMBINDEX, TROPONINI in the last 168 hours. BNP: BNP (last 3 results) Recent Labs    04/11/19 1344 04/28/19 1052 09/22/19 0749  BNP 834.7* 332.0* 175.3*    ProBNP (last 3 results) Recent Labs    03/18/19 1542 04/23/19 1432  PROBNP 3,511* 1,582*    CBG: No results for input(s): GLUCAP in the last 168 hours.     Signed:  Kayleen Memos, MD [facility] 09/25/2019, 1:11 PM

## 2019-09-25 NOTE — Anesthesia Procedure Notes (Signed)
Procedure Name: MAC Date/Time: 09/25/2019 8:56 AM Performed by: Alain Marion, CRNA Pre-anesthesia Checklist: Patient identified, Emergency Drugs available, Suction available and Patient being monitored Oxygen Delivery Method: Nasal cannula Placement Confirmation: positive ETCO2

## 2019-09-25 NOTE — Anesthesia Postprocedure Evaluation (Signed)
Anesthesia Post Note  Patient: Joshua Hudson  Procedure(s) Performed: TRANSESOPHAGEAL ECHOCARDIOGRAM (TEE) (N/A ) CARDIOVERSION (N/A )     Patient location during evaluation: Endoscopy Anesthesia Type: General Level of consciousness: awake and alert Pain management: pain level controlled Vital Signs Assessment: post-procedure vital signs reviewed and stable Respiratory status: spontaneous breathing, nonlabored ventilation and respiratory function stable Cardiovascular status: blood pressure returned to baseline and stable Postop Assessment: no apparent nausea or vomiting Anesthetic complications: no    Last Vitals:  Vitals:   09/25/19 0940 09/25/19 0950  BP: 104/65 107/73  Pulse: 62 (!) 58  Resp: (!) 21 (!) 21  Temp:    SpO2: 100% 100%    Last Pain:  Vitals:   09/25/19 0930  TempSrc: Temporal  PainSc: 0-No pain                 Ajooni Karam,W. EDMOND

## 2019-09-25 NOTE — Progress Notes (Signed)
  Echocardiogram Echocardiogram Transesophageal has been performed.  Bobbye Charleston 09/25/2019, 9:33 AM

## 2019-09-25 NOTE — Progress Notes (Addendum)
Progress Note  Patient Name: Joshua Hudson Date of Encounter: 09/25/2019  Primary Cardiologist: [name], MD   Subjective   Feels well today - successful DCCV this morning, now sinus with PAC's. D/w cardiac EP about possible flutter ablation - they will be happy to see as an outpatient.  Inpatient Medications    Scheduled Meds: . apixaban  2.5 mg Oral BID  . Chlorhexidine Gluconate Cloth  6 each Topical Daily  . feeding supplement (ENSURE ENLIVE)  237 mL Oral BID BM  . folic acid  1 mg Oral Daily  . furosemide  20 mg Intravenous BID  . lip balm      . metoprolol tartrate  12.5 mg Oral BID  . pantoprazole  40 mg Oral QAC breakfast  . potassium chloride SA  20 mEq Oral Daily  . predniSONE  5 mg Oral Q breakfast  . sodium chloride flush  3 mL Intravenous Q12H   Continuous Infusions:  PRN Meds: acetaminophen **OR** acetaminophen, finasteride, metoprolol tartrate, ondansetron **OR** ondansetron (ZOFRAN) IV, oxymetazoline, polyvinyl alcohol, zolpidem   Vital Signs    Vitals:   09/25/19 0930 09/25/19 0940 09/25/19 0950 09/25/19 1025  BP: 94/60 104/65 107/73 114/63  Pulse: (!) 59 62 (!) 58 (!) 57  Resp: 18 (!) 21 (!) 21 20  Temp: (!) 97.3 F (36.3 C)     TempSrc: Temporal     SpO2: 100% 100% 100%   Weight:      Height:        Intake/Output Summary (Last 24 hours) at 09/25/2019 1043 Last data filed at 09/25/2019 0914 Gross per 24 hour  Intake 1170 ml  Output 1950 ml  Net -780 ml   Last 3 Weights 09/25/2019 09/22/2019 09/15/2019  Weight (lbs) 104 lb 15 oz 104 lb 15 oz 105 lb  Weight (kg) 47.6 kg 47.6 kg 47.628 kg      Telemetry    Sinus with PAC's - Personally Reviewed  ECG    Sinus with PAC's at 67, LAFB - Personally Reviewed  Physical Exam   GEN: No acute distress.   Neck: No JVD Cardiac: RRR.  Respiratory: Clear to auscultation bilaterally. GI: Soft, nontender, non-distended  MS: No edema; No deformity. Neuro:  Nonfocal  Psych: Normal affect    Labs    High Sensitivity Troponin:   Recent Labs  Lab 09/22/19 1055 09/22/19 1307  TROPONINIHS 28* 32*      Chemistry Recent Labs  Lab 09/22/19 0749 09/22/19 0749 09/23/19 0137 09/24/19 0510 09/25/19 0131  NA 141   < > 140 139 137  K 3.6   < > 4.2 3.5 3.9  CL 104   < > 106 102 99  CO2 29   < > 24 28 27   GLUCOSE 89   < > 82 102* 88  BUN 19   < > 23 29* 35*  CREATININE 1.21   < > 1.08 1.24 1.15  CALCIUM 9.0   < > 8.9 8.9 8.7*  PROT 6.0*  --   --   --   --   ALBUMIN 3.7  --   --   --   --   AST 27  --   --   --   --   ALT 17  --   --   --   --   ALKPHOS 64  --   --   --   --   BILITOT 1.0  --   --   --   --  GFRNONAA 55*   < > >60 54* 59*  GFRAA >60   < > >60 >60 >60  ANIONGAP 8   < > 10 9 11    < > = values in this interval not displayed.     Hematology Recent Labs  Lab 09/23/19 0137 09/24/19 0118 09/25/19 0131  WBC 6.8 8.0 7.2  RBC 3.95* 4.37 4.32  HGB 8.7* 9.5* 9.6*  HCT 28.5* 31.1* 30.8*  MCV 72.2* 71.2* 71.3*  MCH 22.0* 21.7* 22.2*  MCHC 30.5 30.5 31.2  RDW 17.2* 17.0* 17.3*  PLT 146* 181 145*    BNP Recent Labs  Lab 09/22/19 0749  BNP 175.3*     DDimer No results for input(s): DDIMER in the last 168 hours.   Radiology    No results found.  Cardiac Studies   Echo 09/22/19 IMPRESSIONS    1. Left ventricular ejection fraction, by visual estimation, is 45 to 50%. The left ventricle has mildly decreased function. There is no left ventricular hypertrophy.  2. Left ventricular diastolic parameters are indeterminate.  3. The left ventricle demonstrates global hypokinesis.  4. Global right ventricle has mildly reduced systolic function.The right ventricular size is normal. No increase in right ventricular wall thickness.  5. Left atrial size was moderately dilated.  6. Right atrial size was moderately dilated.  7. Moderate mitral annular calcification.  8. The mitral valve is normal in structure. Trivial mitral valve regurgitation. No  evidence of mitral stenosis.  9. The tricuspid valve is normal in structure. Tricuspid valve regurgitation is mild. 10. The patient had initial surgical bioprosthetic aortic valve followed by valve-in-valve TAVR with 26 mm Edwards Sapien Ultra THV. The gradient across the valve was not measured. There appeared to be trivial peri-valvular regurgitation. 11. The inferior vena cava is normal in size with greater than 50% respiratory variability, suggesting right atrial pressure of 3 mmHg. 12. The tricuspid regurgitant velocity is 2.28 m/s, and with an assumed right atrial pressure of 3 mmHg, the estimated right ventricular systolic pressure is normal at 23.8 mmHg. 13. The patient was in atrial fibrillation.  FINDINGS  Left Ventricle: Left ventricular ejection fraction, by visual estimation, is 45 to 50%. The left ventricle has mildly decreased function. The left ventricle demonstrates global hypokinesis. The left ventricular internal cavity size was the left  ventricle is normal in size. There is no left ventricular hypertrophy. Left ventricular diastolic parameters are indeterminate.  Right Ventricle: The right ventricular size is normal. No increase in right ventricular wall thickness. Global RV systolic function is has mildly reduced systolic function. The tricuspid regurgitant velocity is 2.28 m/s, and with an assumed right atrial  pressure of 3 mmHg, the estimated right ventricular systolic pressure is normal at 23.8 mmHg.  Left Atrium: Left atrial size was moderately dilated.  Right Atrium: Right atrial size was moderately dilated  Pericardium: There is no evidence of pericardial effusion.  Mitral Valve: The mitral valve is normal in structure. There is mild calcification of the mitral valve leaflet(s). Moderate mitral annular calcification. Trivial mitral valve regurgitation. No evidence of mitral valve stenosis by observation.  Tricuspid Valve: The tricuspid valve is normal in  structure. Tricuspid valve regurgitation is mild.  Aortic Valve: The aortic valve has been repaired/replaced. Aortic valve regurgitation is trivial. 26 Edwards Edwards Sapien bioprosthetic, stented aortic valve (TAVR) valve is present in the aortic position. The patient had initial surgical bioprosthetic  aortic valve followed by valve-in-valve TAVR with 26 mm Edwards Sapien Ultra THV.  Pulmonic  Valve: The pulmonic valve was normal in structure. Pulmonic valve regurgitation is trivial. Pulmonic regurgitation is trivial.  Aorta: The aortic root is normal in size and structure.  Venous: The inferior vena cava is normal in size with greater than 50% respiratory variability, suggesting right atrial pressure of 3 mmHg.  IAS/Shunts: No atrial level shunt detected by color flow Doppler.    LEFT VENTRICLE PLAX 2D LVIDd:         4.00 cm LVIDs:         2.70 cm LV PW:         1.10 cm LV IVS:        1.00 cm LVOT diam:     1.80 cm LV SV:         43 ml LV SV Index:   29.85 LVOT Area:     2.54 cm    RIGHT VENTRICLE            IVC RV Basal diam:  3.10 cm    IVC diam: 1.50 cm RV Mid diam:    2.10 cm RV S prime:     6.74 cm/s TAPSE (M-mode): 1.3 cm  LEFT ATRIUM             Index       RIGHT ATRIUM           Index LA diam:        4.50 cm 3.10 cm/m  RA Area:     23.60 cm LA Vol (A2C):   76.2 ml 52.42 ml/m RA Volume:   72.30 ml  49.73 ml/m LA Vol (A4C):   50.8 ml 34.94 ml/m LA Biplane Vol: 65.9 ml 45.33 ml/m  AORTIC VALVE LVOT Vmax:   71.10 cm/s LVOT Vmean:  45.450 cm/s LVOT VTI:    0.133 m   AORTA Ao Root diam: 2.80 cm Ao Asc diam:  3.20 cm  MITRAL VALVE                        TRICUSPID VALVE MV Area (PHT): 4.40 cm             TR Peak grad:   20.8 mmHg MV PHT:        50.02 msec           TR Vmax:        228.00 cm/s MV Decel Time: 173 msec MV E velocity: 119.50 cm/s 103 cm/s SHUNTS                                     Systemic VTI:  0.13 m                                      Systemic Diam: 1.80 cm      Patient Profile     85 y.o. male with history of severe AS s/p 26 mm Edwards AVR in 2009 (post op afib) and recent valve-in-valve TAVR 04/2019 (26 mm Edwards Sapien THV) presented from endoscopy (He was scheduled to have an esophageal dilatation- apparently told to do this periodically with Dr. Therisa Doyne) however noted in afib RVR/aflutter and admitted.  Assessment & Plan    1.  New onset atrial flutter with rapid ventricular rate -Patient has a remote history of postoperative atrial fibrillation.  He was placed  on IV Cardizem for rate control however discontinued due to hypotension.  He got 2.5 mg of IV metoprolol for elevated heart rate overnight.  Currently heart rate in 100-130s with soft blood pressure. CHADSVASCs score of 6.  Anticoagulated with Eliquis.  -Successful cardioversion today, maintaining sinus with PAC's - D/w cardiac EP, may be a candidate for outpatient flutter ablation - will arrange a follow-up appt.  2.  Acute systolic heart failure -Episode LV function of 45 to 50%.  He appears euvolemic.  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 . -appears euvolemic, stop IV lasix, resume home lasix 40 mg po daily tomorrow -continue low dose metoprolol  3.  Esophageal stricture -Pt has achalasia, not stricture. Follow-up with Dr. Therisa Doyne in GI.  4.  Elevated troponin -Flat trend.  No chest pain.  Demand in setting of tachycardia.  5. S/p AVR in 2009 & most recent TAVR 04/2019 - trivial peri-valvular regurgitation -Now off aspirin and Plavix due to need of anticoagulation  Ok to d/c home today from a cardiac standpoint. We will arrange EP and general cardiology follow-up.   [facility] will sign off.   Medication Recommendations:  As above Other recommendations (labs, testing, etc):  Outpatient EP eval Follow up as an outpatient:  Dr. Radford Pax or APP  For questions or updates, please contact [facility] Please consult [email or web address]  for contact info under   Pixie Casino, MD, FACC, [place] Director of the [facility] Diplomate of the American Board of Clinical Lipidology Attending Cardiologist  Direct Dial: [phone]  Fax: [phone]  Website:  [email or web address]  Pixie Casino, MD  09/25/2019, 10:43 AM

## 2019-09-25 NOTE — Interval H&P Note (Signed)
History and Physical Interval Note:  09/25/2019 8:43 AM  Joshua Hudson  has presented today for surgery, with the diagnosis of AFIB.  The various methods of treatment have been discussed with the patient and family. After consideration of risks, benefits and other options for treatment, the patient has consented to  Procedure(s): TRANSESOPHAGEAL ECHOCARDIOGRAM (TEE) (N/A) CARDIOVERSION (N/A) as a surgical intervention.  The patient's history has been reviewed, patient examined, no change in status, stable for surgery.  I have reviewed the patient's chart and labs.  Questions were answered to the patient's satisfaction.     [employer]

## 2019-09-25 NOTE — Plan of Care (Signed)
Pt states he understands plan for TEE and possible cardioversion in the morning

## 2019-09-25 NOTE — Progress Notes (Signed)
Pt signed both consents for TEE and cardioversion.  Pt fully awake and alert, education complete.  Pts left chest hair clipped, EKG complete.  Pt requested Ambien to help him sleep.  Pt requested to stay in chair to sleep as this is his habit at home.  Chair alarm on.

## 2019-09-30 ENCOUNTER — Telehealth: Payer: Self-pay | Admitting: Hematology and Oncology

## 2019-09-30 NOTE — Telephone Encounter (Signed)
FAXED OFFICE NOTES TO [facility] [phone] °

## 2019-10-02 NOTE — Progress Notes (Signed)
Patient Care Team: Lavone Orn, MD as PCP - General (Internal Medicine) Sueanne Margarita, MD as PCP - Cardiology (Cardiology) Sueanne Margarita, MD as Consulting Physician (Cardiology)  DIAGNOSIS:    ICD-10-CM   1. Other pancytopenia ([place])  [id number]     CHIEF COMPLIANT: Follow-up of anemia, thrombocytopenia  INTERVAL HISTORY: Joshua Hudson is a 85 y.o. with above-mentioned history of anemia and thrombocytopenia. He underwent a right shoulder replacement on 07/19/19. He was admitted from 09/22/19-09/25/19 for atrial fibrillation. He presents to the clinic todayto review his labs. He doesnot complain of fatigue or SOB.  ALLERGIES:  has No Known Allergies.  MEDICATIONS:  Current Outpatient Medications  Medication Sig Dispense Refill  . apixaban (ELIQUIS) 2.5 MG TABS tablet Take 1 tablet (2.5 mg total) by mouth 2 (two) times daily. 120 tablet 0  . feeding supplement, ENSURE ENLIVE, (ENSURE ENLIVE) LIQD Take 237 mLs by mouth 3 (three) times daily between meals. (Patient not taking: Reported on 09/17/2019) 237 mL 12  . finasteride (PROSCAR) 5 MG tablet Take 5 mg by mouth daily as needed (retention).     . folic acid (FOLVITE) 1 MG tablet Take 1 mg by mouth daily.    . furosemide (LASIX) 40 MG tablet Take 1 tablet (40 mg total) by mouth daily. 60 tablet 0  . metoprolol tartrate (LOPRESSOR) 25 MG tablet Take 0.5 tablets (12.5 mg total) by mouth 2 (two) times daily. 60 tablet 0  . Multiple Vitamin (MULTIVITAMIN WITH MINERALS) TABS tablet Take 1 tablet by mouth daily.    Marland Kitchen oxymetazoline (AFRIN) 0.05 % nasal spray Place 1 spray into both nostrils 2 (two) times daily as needed for congestion.    . pantoprazole (PROTONIX) 40 MG tablet Take 40 mg by mouth daily before breakfast.     . potassium chloride SA (KLOR-CON) 20 MEQ tablet Take 1 tablet (20 mEq total) by mouth daily. 60 tablet 0  . predniSONE (DELTASONE) 5 MG tablet Take 5 mg by mouth daily with breakfast.     . THERATEARS 0.25 % SOLN  Place 1 drop into both eyes 3 (three) times daily as needed (dry/irritated eyes.).     No current facility-administered medications for this visit.    PHYSICAL EXAMINATION: ECOG PERFORMANCE STATUS: 1 - Symptomatic but completely ambulatory  There were no vitals filed for this visit. There were no vitals filed for this visit.  LABORATORY DATA:  I have reviewed the data as listed CMP Latest Ref Rng & Units 09/25/2019 09/24/2019 09/23/2019  Glucose 70 - 99 mg/dL 88 102(H) 82  BUN 8 - 23 mg/dL 35(H) 29(H) 23  Creatinine 0.61 - 1.24 mg/dL 1.15 1.24 1.08  Sodium 135 - 145 mmol/L 137 139 140  Potassium 3.5 - 5.1 mmol/L 3.9 3.5 4.2  Chloride 98 - 111 mmol/L 99 102 106  CO2 22 - 32 mmol/L 27 28 24   Calcium 8.9 - 10.3 mg/dL 8.7(L) 8.9 8.9  Total Protein 6.5 - 8.1 g/dL - - -  Total Bilirubin 0.3 - 1.2 mg/dL - - -  Alkaline Phos 38 - 126 U/L - - -  AST 15 - 41 U/L - - -  ALT 0 - 44 U/L - - -    Lab Results  Component Value Date   WBC 7.5 10/03/2019   HGB 8.9 (L) 10/03/2019   HCT 28.6 (L) 10/03/2019   MCV 71.1 (L) 10/03/2019   PLT 135 (L) 10/03/2019   NEUTROABS 5.9 10/03/2019    ASSESSMENT & PLAN:  Other pancytopenia ([place]) On 05/06/2019: Hemoglobin 7.6: Blood transfusion given Hemoglobin electrophoresis: Beta thalassemia minor.This is the cause of microcytosis. It is not iron deficiency related.  Possible differential: Myelodysplastic syndrome versus methotrexate related toxicity. His methotrexate has been on hold. Hospitalization 09/22/2019-09/25/2019: A. fib with RVR status post cardioversion, currently on Eliquis  Lab review:  09/25/2019: WBC 7.2, hemoglobin 9.6, platelets 145 10/03/19: WBC: 7.5, Hb 8.9, Pl: 135 Even though he has microcytic anemia, this microcytosis is related to beta thalassemia. His anemia is due to chronic disease.   Because he is asymptomatic, we decided not to initiate Aranesp at this time  Return to clinic in 6 months with labs and  follow-up Instructed him to call us if he felt worse.    No orders of the defined types were placed in this encounter.  The patient has a good understanding of the overall plan. he agrees with it. he will call with any problems that may develop before the next visit here.  Total time spent: 20 mins including face to face time and time spent for planning, charting and coordination of care  Nicholas Lose, MD 10/03/2019  I, Cloyde Reams Dorshimer, am acting as scribe for Dr. Nicholas Lose.  I have reviewed the above documentation for accuracy and completeness, and I agree with the above.

## 2019-10-03 ENCOUNTER — Inpatient Hospital Stay (HOSPITAL_BASED_OUTPATIENT_CLINIC_OR_DEPARTMENT_OTHER): Payer: Medicare PPO | Admitting: Hematology and Oncology

## 2019-10-03 ENCOUNTER — Inpatient Hospital Stay: Payer: Medicare PPO | Attending: Hematology and Oncology

## 2019-10-03 ENCOUNTER — Encounter: Payer: Self-pay | Admitting: *Deleted

## 2019-10-03 ENCOUNTER — Inpatient Hospital Stay: Payer: Medicare PPO

## 2019-10-03 ENCOUNTER — Other Ambulatory Visit: Payer: Self-pay

## 2019-10-03 DIAGNOSIS — Z7901 Long term (current) use of anticoagulants: Secondary | ICD-10-CM | POA: Diagnosis not present

## 2019-10-03 DIAGNOSIS — I4891 Unspecified atrial fibrillation: Secondary | ICD-10-CM | POA: Diagnosis not present

## 2019-10-03 DIAGNOSIS — D61818 Other pancytopenia: Secondary | ICD-10-CM

## 2019-10-03 DIAGNOSIS — D696 Thrombocytopenia, unspecified: Secondary | ICD-10-CM

## 2019-10-03 DIAGNOSIS — D638 Anemia in other chronic diseases classified elsewhere: Secondary | ICD-10-CM

## 2019-10-03 DIAGNOSIS — D563 Thalassemia minor: Secondary | ICD-10-CM | POA: Diagnosis not present

## 2019-10-03 LAB — CMP (CANCER CENTER ONLY)
ALT: 18 U/L (ref 0–44)
AST: 21 U/L (ref 15–41)
Albumin: 3.7 g/dL (ref 3.5–5.0)
Alkaline Phosphatase: 74 U/L (ref 38–126)
Anion gap: 7 (ref 5–15)
BUN: 20 mg/dL (ref 8–23)
CO2: 31 mmol/L (ref 22–32)
Calcium: 9 mg/dL (ref 8.9–10.3)
Chloride: 105 mmol/L (ref 98–111)
Creatinine: 1.14 mg/dL (ref 0.61–1.24)
GFR, Est AFR Am: >60 mL/min (ref >60)
GFR, Estimated: 60 mL/min — ABNORMAL LOW (ref >60)
Glucose, Bld: 101 mg/dL — ABNORMAL HIGH (ref 70–99)
Potassium: 5.1 mmol/L (ref 3.5–5.1)
Sodium: 143 mmol/L (ref 135–145)
Total Bilirubin: 0.6 mg/dL (ref 0.3–1.2)
Total Protein: 5.8 g/dL — ABNORMAL LOW (ref 6.5–8.1)

## 2019-10-03 LAB — RETICULOCYTES
Immature Retic Fract: 26.8 % — ABNORMAL HIGH (ref 2.3–15.9)
RBC.: 4.01 MIL/uL — ABNORMAL LOW (ref 4.22–5.81)
Retic Count, Absolute: 91.4 10*3/uL (ref 19.0–186.0)
Retic Ct Pct: 2.3 % (ref 0.4–3.1)

## 2019-10-03 LAB — CBC WITH DIFFERENTIAL (CANCER CENTER ONLY)
Abs Immature Granulocytes: 0.03 10*3/uL (ref 0.00–0.07)
Basophils Absolute: 0 10*3/uL (ref 0.0–0.1)
Basophils Relative: 1 %
Eosinophils Absolute: 0.1 10*3/uL (ref 0.0–0.5)
Eosinophils Relative: 1 %
HCT: 28.6 % — ABNORMAL LOW (ref 39.0–52.0)
Hemoglobin: 8.9 g/dL — ABNORMAL LOW (ref 13.0–17.0)
Immature Granulocytes: 0 %
Lymphocytes Relative: 12 %
Lymphs Abs: 0.9 10*3/uL (ref 0.7–4.0)
MCH: 22.1 pg — ABNORMAL LOW (ref 26.0–34.0)
MCHC: 31.1 g/dL (ref 30.0–36.0)
MCV: 71.1 fL — ABNORMAL LOW (ref 80.0–100.0)
Monocytes Absolute: 0.6 10*3/uL (ref 0.1–1.0)
Monocytes Relative: 7 %
Neutro Abs: 5.9 10*3/uL (ref 1.7–7.7)
Neutrophils Relative %: 79 %
Platelet Count: 135 10*3/uL — ABNORMAL LOW (ref 150–400)
RBC: 4.02 MIL/uL — ABNORMAL LOW (ref 4.22–5.81)
RDW: 17.6 % — ABNORMAL HIGH (ref 11.5–15.5)
WBC Count: 7.5 10*3/uL (ref 4.0–10.5)
nRBC: 0.5 % — ABNORMAL HIGH (ref 0.0–0.2)

## 2019-10-03 LAB — SAMPLE TO BLOOD BANK

## 2019-10-03 LAB — C-REACTIVE PROTEIN: CRP: 0.5 mg/dL (ref <1.0)

## 2019-10-03 NOTE — Assessment & Plan Note (Signed)
On 05/06/2019: Hemoglobin 7.6: Blood transfusion given Hemoglobin electrophoresis: Beta thalassemia minor.This is the cause of microcytosis. It is not iron deficiency related.  Possible differential: Myelodysplastic syndrome versus methotrexate related toxicity. His methotrexate has been on hold. Hospitalization 09/22/2019-09/25/2019: A. fib with RVR status post cardioversion, currently on Eliquis  Lab review: 09/25/2019: WBC 7.2, hemoglobin 9.6, platelets 145 Even though he has microcytic anemia, this microcytosis is related to beta thalassemia. His anemia is due to chronic disease.  Since it is relatively stable this can be watched and monitored.  Return to clinic in 6 months with labs and follow-up

## 2019-10-03 NOTE — Progress Notes (Signed)
Per MD request, lab work successfully faxed to [facility] [phone])

## 2019-10-06 ENCOUNTER — Other Ambulatory Visit (HOSPITAL_COMMUNITY)
Admission: RE | Admit: 2019-10-06 | Discharge: 2019-10-06 | Disposition: A | Discharge status: Home / Self Care | Payer: Medicare PPO | Source: Ambulatory Visit | Attending: Internal Medicine | Admitting: Internal Medicine

## 2019-10-06 ENCOUNTER — Telehealth: Payer: Self-pay | Admitting: Hematology and Oncology

## 2019-10-06 ENCOUNTER — Ambulatory Visit: Payer: Medicare PPO | Admitting: Internal Medicine

## 2019-10-06 ENCOUNTER — Other Ambulatory Visit: Payer: Self-pay

## 2019-10-06 VITALS — BP 122/66 | HR 67 | Ht 62.0 in | Wt 108.0 lb

## 2019-10-06 DIAGNOSIS — I4892 Unspecified atrial flutter: Secondary | ICD-10-CM

## 2019-10-06 DIAGNOSIS — Z20822 Contact with and (suspected) exposure to covid-19: Secondary | ICD-10-CM | POA: Diagnosis not present

## 2019-10-06 DIAGNOSIS — Z01812 Encounter for preprocedural laboratory examination: Secondary | ICD-10-CM | POA: Diagnosis present

## 2019-10-06 LAB — SARS CORONAVIRUS 2 (TAT 6-24 HRS): SARS Coronavirus 2: NEGATIVE

## 2019-10-06 NOTE — Telephone Encounter (Signed)
I left a message regarding schedule  

## 2019-10-06 NOTE — Progress Notes (Signed)
HPI [name] is referred for evaluation of atrial flutter. He is a pleasant 85 yo man with aortic valve disease, s/p SAVR and then TAVR. He presented with atrial flutter last week and was cardioverted. He has been on Eliquis. He does not have recurent palps. When he is in flutter, he feels palpitations and sob. No chest pain.  No Known Allergies   Current Outpatient Medications  Medication Sig Dispense Refill  . apixaban (ELIQUIS) 2.5 MG TABS tablet Take 1 tablet (2.5 mg total) by mouth 2 (two) times daily. 120 tablet 0  . finasteride (PROSCAR) 5 MG tablet Take 5 mg by mouth daily as needed (retention).     . folic acid (FOLVITE) 1 MG tablet Take 1 mg by mouth daily.    . furosemide (LASIX) 40 MG tablet Take 1 tablet (40 mg total) by mouth daily. 60 tablet 0  . metoprolol tartrate (LOPRESSOR) 25 MG tablet Take 0.5 tablets (12.5 mg total) by mouth 2 (two) times daily. 60 tablet 0  . Multiple Vitamin (MULTIVITAMIN WITH MINERALS) TABS tablet Take 1 tablet by mouth daily.    . pantoprazole (PROTONIX) 40 MG tablet Take 40 mg by mouth daily before breakfast.     . potassium chloride SA (KLOR-CON) 20 MEQ tablet Take 1 tablet (20 mEq total) by mouth daily. 60 tablet 0  . predniSONE (DELTASONE) 5 MG tablet Take 5 mg by mouth daily with breakfast.      No current facility-administered medications for this visit.     Past Medical History:  Diagnosis Date  . Achalasia 06/12/2019   Noted on EGD  . Aortic insufficiency 03/24/2019   AI of bioprosthetic AVR severe 4 plus  . Chronic diastolic CHF (congestive heart failure) ([place]) 03/24/2019  . Colon polyps    s/p diverticular perforation requiring 2-stage repair  . Derangement of right shoulder joint    need replacing has no use of  . Diverticulosis   . DJD (degenerative joint disease), lumbosacral   . Dysphagia    eats soft food  . Esophageal stricture   . GERD (gastroesophageal reflux disease)   . Hemolytic anemia ([place])   . History  of blood transfusion given with 04-15-19 surgery  . History of blood transfusion 07/18/2019  . History of kidney stones    passed stones  . Hypertension   . Mitral regurgitation    moderate  . Occipital neuralgia   . Pancytopenia ([place])   . Postoperative atrial fibrillation ([place]) 05/10/2015  . Prosthetic valve dysfunction   . Rheumatoid arthritis ([place])    s/o long term steroids  shoulders and hands  . Rotator cuff arthropathy    right  . S/P aortic valve replacement with bioprosthetic valve 01/16/2008   70mm Edwards Magna perimount bovine pericardial tissue valve, model 3000  . S/P valve-in-valve TAVR 04/15/2019   26 mm Edwards Sapien 3 Ultra transcatheter heart valve placed via percutaneous right transfemoral approach   . SBE (subacute bacterial endocarditis) prophylaxis candidate    for dental procedures  . Schatzki's ring 06/12/2019   Narrowing, Noted on EGD  . Severe aortic stenosis    S/P prosthetic valve replacement w 25 mm Edwards like science percardial tissue valve,[place] - 01/2008  . Thrombocytopenia ([place])     ROS:   All systems reviewed and negative except as noted in the HPI.   Past Surgical History:  Procedure Laterality Date  . APPENDECTOMY    . BALLOON DILATION N/A 06/12/2019   Procedure:  BALLOON DILATION;  Surgeon: Ronnette Juniper, MD;  Location: [facility];  Service: Gastroenterology;  Laterality: N/A;  . BOTOX INJECTION N/A 01/22/2018   Procedure: BOTOX INJECTION;  Surgeon: Ronnette Juniper, MD;  Location: [facility];  Service: Gastroenterology;  Laterality: N/A;  . CARDIAC CATHETERIZATION     09  . CARDIAC VALVE REPLACEMENT  01/2008   aortic valve replacement  . CARDIOVERSION N/A 09/25/2019   Procedure: CARDIOVERSION;  Surgeon: Jerline Pain, MD;  Location: [facility] ENDOSCOPY;  Service: Cardiovascular;  Laterality: N/A;  . CATARACT EXTRACTION W/ INTRAOCULAR LENS  IMPLANT, BILATERAL    . COLON RESECTION     diverticulitis   . CORONARY ANGIOPLASTY    . dental implants      permanent  . ESOPHAGEAL MANOMETRY N/A 11/07/2017   Procedure: ESOPHAGEAL MANOMETRY (EM);  Surgeon: Ronnette Juniper, MD;  Location: [facility];  Service: Gastroenterology;  Laterality: N/A;  . ESOPHAGOGASTRODUODENOSCOPY (EGD) WITH PROPOFOL N/A 01/22/2018   Procedure: ESOPHAGOGASTRODUODENOSCOPY (EGD) WITH PROPOFOL;  Surgeon: Ronnette Juniper, MD;  Location: [facility];  Service: Gastroenterology;  Laterality: N/A;  . ESOPHAGOGASTRODUODENOSCOPY (EGD) WITH PROPOFOL N/A 04/30/2019   Procedure: ESOPHAGOGASTRODUODENOSCOPY (EGD) WITH PROPOFOL;  Surgeon: Laurence Spates, MD;  Location: [place];  Service: Endoscopy;  Laterality: N/A;  . ESOPHAGOGASTRODUODENOSCOPY (EGD) WITH PROPOFOL N/A 06/12/2019   Procedure: ESOPHAGOGASTRODUODENOSCOPY (EGD) WITH PROPOFOL;  Surgeon: Ronnette Juniper, MD;  Location: [facility];  Service: Gastroenterology;  Laterality: N/A;  with botox injection  . EXCISIONAL TOTAL SHOULDER ARTHROPLASTY WITH ANTIBIOTIC SPACER Right 12/31/2018   Procedure: EXCISIONAL TOTAL SHOULDER ARTHROPLASTY WITH ANTIBIOTIC SPACER;  Surgeon: Netta Cedars, MD;  Location: [place];  Service: Orthopedics;  Laterality: Right;  . HERNIA REPAIR    . INTRAOPERATIVE TRANSTHORACIC ECHOCARDIOGRAM N/A 04/15/2019   Procedure: Intraoperative Transthoracic Echocardiogram;  Surgeon: Sherren Mocha, MD;  Location: [place];  Service: Open Heart Surgery;  Laterality: N/A;  . IRRIGATION AND DEBRIDEMENT SHOULDER Right 11/20/2018    IRRIGATION AND DEBRIDEMENT SHOULDER WITH POLY EXCHANGE (Right Shoulder)  . IRRIGATION AND DEBRIDEMENT SHOULDER Right 11/20/2018   Procedure: IRRIGATION AND DEBRIDEMENT SHOULDER WITH POLY EXCHANGE;  Surgeon: Netta Cedars, MD;  Location: [place];  Service: Orthopedics;  Laterality: Right;  . LUMBAR LAMINECTOMY     x 2  . REVERSE SHOULDER ARTHROPLASTY Right 03/01/2018   Procedure: RIGHT REVERSE SHOULDER ARTHROPLASTY;  Surgeon: Netta Cedars, MD;  Location: [place];  Service: Orthopedics;  Laterality: Right;  .  REVERSE SHOULDER ARTHROPLASTY Right 07/18/2019   Procedure: REVERSE TOTAL SHOULDER ARTHROPLASTY and removal of antiobotic spacer;  Surgeon: Netta Cedars, MD;  Location: [facility];  Service: Orthopedics;  Laterality: Right;  interscalene block  . REVERSE SHOULDER ARTHROPLASTY Right 08/06/2019   Procedure: Reduction of dislocated reverse total shoulder and poly exchange;  Surgeon: Netta Cedars, MD;  Location: [facility];  Service: Orthopedics;  Laterality: Right;  need 1 hour  . RIGHT/LEFT HEART CATH AND CORONARY ANGIOGRAPHY N/A 04/02/2019   Procedure: RIGHT/LEFT HEART CATH AND CORONARY ANGIOGRAPHY;  Surgeon: Leonie Man, MD;  Location: [place] [facility];  Service: Cardiovascular;  Laterality: N/A;  . SAVORY DILATION N/A 01/22/2018   Procedure: SAVORY DILATION;  Surgeon: Ronnette Juniper, MD;  Location: [facility];  Service: Gastroenterology;  Laterality: N/A;  . SAVORY DILATION N/A 04/30/2019   Procedure: SAVORY DILATION;  Surgeon: Laurence Spates, MD;  Location: [place];  Service: Endoscopy;  Laterality: N/A;  With fluro  . SHOULDER HEMI-ARTHROPLASTY Right 06/14/2018   Procedure: RIGHT  REVERSE TOTAL SHOULDER OPEN POLY EXCHANGE;  Surgeon: Veverly Fells,  Richardson Landry, MD;  Location: [place];  Service: Orthopedics;  Laterality: Right;  . SUBMUCOSAL INJECTION  06/12/2019   Procedure: SUBMUCOSAL INJECTION;  Surgeon: Ronnette Juniper, MD;  Location: [facility];  Service: Gastroenterology;;  . TEE WITHOUT CARDIOVERSION N/A 01/29/2014   Procedure: TRANSESOPHAGEAL ECHOCARDIOGRAM (TEE);  Surgeon: Sueanne Margarita, MD;  Location: [facility] ENDOSCOPY;  Service: Cardiovascular;  Laterality: N/A;  . TEE WITHOUT CARDIOVERSION N/A 04/02/2019   Procedure: TRANSESOPHAGEAL ECHOCARDIOGRAM (TEE);  Surgeon: Josue Hector, MD;  Location: [facility] ENDOSCOPY;  Service: Cardiovascular;  Laterality: N/A;  . TEE WITHOUT CARDIOVERSION N/A 09/25/2019   Procedure: TRANSESOPHAGEAL ECHOCARDIOGRAM (TEE);  Surgeon: Jerline Pain, MD;  Location: [facility];   Service: Cardiovascular;  Laterality: N/A;  . TONSILLECTOMY    . TRANSCATHETER AORTIC VALVE REPLACEMENT, TRANSFEMORAL  04/15/2019  . TRANSCATHETER AORTIC VALVE REPLACEMENT, TRANSFEMORAL N/A 04/15/2019   Procedure: TRANSCATHETER AORTIC VALVE REPLACEMENT, TRANSFEMORAL with POST BALLOON DILATION;  Surgeon: Sherren Mocha, MD;  Location: [place];  Service: Open Heart Surgery;  Laterality: N/A;     Family History  Problem Relation Age of Onset  . Other Mother        NO HEALTH PROBLEMS  . Heart disease Father   . Arthritis Father   . Heart Problems Sister        RELATED TO A MVA  . Suicidality Brother   . Other Sister        [place]  . Other Brother        2 [place]  . Other Daughter        2 [place]     Social History   Socioeconomic History  . Marital status: Widowed    Spouse name: Not on file  . Number of children: 2  . Years of education: Not on file  . Highest education level: Not on file  Occupational History  . Occupation: Retired Market researcher at [employer]  . Smoking status: Former Smoker    Packs/day: 0.50    Years: 10.00    Pack years: 5.00    Types: Cigarettes    Start date: 01/16/1974    Quit date: 09/05/1983    Years since quitting: 36.1  . Smokeless tobacco: Former Systems developer    Types: Chew  Substance and Sexual Activity  . Alcohol use: Not Currently    Alcohol/week: 0.0 standard drinks  . Drug use: No  . Sexual activity: Not on file  Other Topics Concern  . Not on file  Social History Narrative  . Not on file   Social Determinants of Health   Financial Resource Strain:   . Difficulty of Paying Living Expenses: Not on file  Food Insecurity:   . Worried About Charity fundraiser in the Last Year: Not on file  . Ran Out of Food in the Last Year: Not on file  Transportation Needs:   . Lack of Transportation (Medical): Not on file  . Lack of Transportation (Non-Medical): Not on file  Physical Activity:   . Days of  Exercise per Week: Not on file  . Minutes of Exercise per Session: Not on file  Stress:   . Feeling of Stress : Not on file  Social Connections:   . Frequency of Communication with Friends and Family: Not on file  . Frequency of Social Gatherings with Friends and Family: Not on file  . Attends Religious Services: Not on file  . Active Member of Clubs or  Organizations: Not on file  . Attends Archivist Meetings: Not on file  . Marital Status: Not on file  Intimate Partner Violence:   . Fear of Current or Ex-Partner: Not on file  . Emotionally Abused: Not on file  . Physically Abused: Not on file  . Sexually Abused: Not on file     BP 122/66   Pulse 67   Ht 5\' 2"  (1.575 m)   Wt 108 lb (49 kg)   BMI 19.75 kg/m   Physical Exam:  Well appearing 85 yo man, NAD HEENT: Unremarkable Neck:  No JVD, no thyromegally Lymphatics:  No adenopathy Back:  No CVA tenderness Lungs:  Clear with no wheezes HEART:  Regular rate rhythm, no murmurs, no rubs, no clicks Abd:  soft, positive bowel sounds, no organomegally, no rebound, no guarding Ext:  2 plus pulses, no edema, no cyanosis, no clubbing Skin:  No rashes no nodules Neuro:  CN II through XII intact, motor grossly intact  EKG - nsr  DEVICE  Normal device function.  See PaceArt for details.   Assess/Plan: 1. Atrial flutter - I have discussed the indications/risks/benefits/goals/expectations of atrial flutter ablation and he wishes to proceed. 2. AS - he is s/p TAVR and doing well with no evidence of complication.  3. Coags - the patient might be able to come off eliquis after ablation.  Mikle [email or web address].D.

## 2019-10-06 NOTE — Patient Instructions (Addendum)
Medication Instructions:  Your physician recommends that you continue on your current medications as directed. Please refer to the Current Medication list given to you today.  Labwork: None ordered.  Testing/Procedures: None ordered.  Follow-Up:  SEE INSTRUCTION LETTER  Any Other Special Instructions Will Be Listed Below (If Applicable).  If you need a refill on your cardiac medications before your next appointment, please call your pharmacy.    Cardiac Ablation Cardiac ablation is a procedure to disable (ablate) a small amount of heart tissue in very specific places. The heart has many electrical connections. Sometimes these connections are abnormal and can cause the heart to beat very fast or irregularly. Ablating some of the problem areas can improve the heart rhythm or return it to normal. Ablation may be done for people who:  Have Wolff-Parkinson-White syndrome.  Have fast heart rhythms (tachycardia).  Have taken medicines for an abnormal heart rhythm (arrhythmia) that were not effective or caused side effects.  Have a high-risk heartbeat that may be life-threatening. During the procedure, a small incision is made in the neck or the groin, and a long, thin, flexible tube (catheter) is inserted into the incision and moved to the heart. Small devices (electrodes) on the tip of the catheter will send out electrical currents. A type of X-ray (fluoroscopy) will be used to help guide the catheter and to provide images of the heart. Tell a health care provider about:  Any allergies you have.  All medicines you are taking, including vitamins, herbs, eye drops, creams, and over-the-counter medicines.  Any problems you or family members have had with anesthetic medicines.  Any blood disorders you have.  Any surgeries you have had.  Any medical conditions you have, such as kidney failure.  Whether you are pregnant or may be pregnant. What are the risks? Generally, this is a  safe procedure. However, problems may occur, including:  Infection.  Bruising and bleeding at the catheter insertion site.  Bleeding into the chest, especially into the sac that surrounds the heart. This is a serious complication.  Stroke or blood clots.  Damage to other structures or organs.  Allergic reaction to medicines or dyes.  Need for a permanent pacemaker if the normal electrical system is damaged. A pacemaker is a small computer that sends electrical signals to the heart and helps your heart beat normally.  The procedure not being fully effective. This may not be recognized until months later. Repeat ablation procedures are sometimes required. What happens before the procedure?  Follow instructions from your health care provider about eating or drinking restrictions.  Ask your health care provider about: ? Changing or stopping your regular medicines. This is especially important if you are taking diabetes medicines or blood thinners. ? Taking medicines such as aspirin and ibuprofen. These medicines can thin your blood. Do not take these medicines before your procedure if your health care provider instructs you not to.  Plan to have someone take you home from the hospital or clinic.  If you will be going home right after the procedure, plan to have someone with you for 24 hours. What happens during the procedure?  To lower your risk of infection: ? Your health care team will wash or sanitize their hands. ? Your skin will be washed with soap. ? Hair may be removed from the incision area.  An IV tube will be inserted into one of your veins.  You will be given a medicine to help you relax (sedative).  The   skin on your neck or groin will be numbed.  An incision will be made in your neck or your groin.  A needle will be inserted through the incision and into a large vein in your neck or groin.  A catheter will be inserted into the needle and moved to your  heart.  Dye may be injected through the catheter to help your surgeon see the area of the heart that needs treatment.  Electrical currents will be sent from the catheter to ablate heart tissue in desired areas. There are three types of energy that may be used to ablate heart tissue: ? Heat (radiofrequency energy). ? Laser energy. ? Extreme cold (cryoablation).  When the necessary tissue has been ablated, the catheter will be removed.  Pressure will be held on the catheter insertion area to prevent excessive bleeding.  A bandage (dressing) will be placed over the catheter insertion area. The procedure may vary among health care providers and hospitals. What happens after the procedure?  Your blood pressure, heart rate, breathing rate, and blood oxygen level will be monitored until the medicines you were given have worn off.  Your catheter insertion area will be monitored for bleeding. You will need to lie still for a few hours to ensure that you do not bleed from the catheter insertion area.  Do not drive for 24 hours or as long as directed by your health care provider. Summary  Cardiac ablation is a procedure to disable (ablate) a small amount of heart tissue in very specific places. Ablating some of the problem areas can improve the heart rhythm or return it to normal.  During the procedure, electrical currents will be sent from the catheter to ablate heart tissue in desired areas. This information is not intended to replace advice given to you by your health care provider. Make sure you discuss any questions you have with your health care provider. Document Revised: 02/11/2018 Document Reviewed: 07/10/2016 Elsevier Patient Education  2020 [employer]  

## 2019-10-06 NOTE — H&P (View-Only) (Signed)
HPI Mr. Oguinn is referred for evaluation of atrial flutter. He is a pleasant 85 yo man with aortic valve disease, s/p SAVR and then TAVR. He presented with atrial flutter last week and was cardioverted. He has been on Eliquis. He does not have recurent palps. When he is in flutter, he feels palpitations and sob. No chest pain.  No Known Allergies   Current Outpatient Medications  Medication Sig Dispense Refill  . apixaban (ELIQUIS) 2.5 MG TABS tablet Take 1 tablet (2.5 mg total) by mouth 2 (two) times daily. 120 tablet 0  . finasteride (PROSCAR) 5 MG tablet Take 5 mg by mouth daily as needed (retention).     . folic acid (FOLVITE) 1 MG tablet Take 1 mg by mouth daily.    . furosemide (LASIX) 40 MG tablet Take 1 tablet (40 mg total) by mouth daily. 60 tablet 0  . metoprolol tartrate (LOPRESSOR) 25 MG tablet Take 0.5 tablets (12.5 mg total) by mouth 2 (two) times daily. 60 tablet 0  . Multiple Vitamin (MULTIVITAMIN WITH MINERALS) TABS tablet Take 1 tablet by mouth daily.    . pantoprazole (PROTONIX) 40 MG tablet Take 40 mg by mouth daily before breakfast.     . potassium chloride SA (KLOR-CON) 20 MEQ tablet Take 1 tablet (20 mEq total) by mouth daily. 60 tablet 0  . predniSONE (DELTASONE) 5 MG tablet Take 5 mg by mouth daily with breakfast.      No current facility-administered medications for this visit.     Past Medical History:  Diagnosis Date  . Achalasia 06/12/2019   Noted on EGD  . Aortic insufficiency 03/24/2019   AI of bioprosthetic AVR severe 4 plus  . Chronic diastolic CHF (congestive heart failure) ([place]) 03/24/2019  . Colon polyps    s/p diverticular perforation requiring 2-stage repair  . Derangement of right shoulder joint    need replacing has no use of  . Diverticulosis   . DJD (degenerative joint disease), lumbosacral   . Dysphagia    eats soft food  . Esophageal stricture   . GERD (gastroesophageal reflux disease)   . Hemolytic anemia ([place])   . History  of blood transfusion given with 04-15-19 surgery  . History of blood transfusion 07/18/2019  . History of kidney stones    passed stones  . Hypertension   . Mitral regurgitation    moderate  . Occipital neuralgia   . Pancytopenia ([place])   . Postoperative atrial fibrillation ([place]) 05/10/2015  . Prosthetic valve dysfunction   . Rheumatoid arthritis ([place])    s/o long term steroids  shoulders and hands  . Rotator cuff arthropathy    right  . S/P aortic valve replacement with bioprosthetic valve 01/16/2008   56mm Edwards Magna perimount bovine pericardial tissue valve, model 3000  . S/P valve-in-valve TAVR 04/15/2019   26 mm Edwards Sapien 3 Ultra transcatheter heart valve placed via percutaneous right transfemoral approach   . SBE (subacute bacterial endocarditis) prophylaxis candidate    for dental procedures  . Schatzki's ring 06/12/2019   Narrowing, Noted on EGD  . Severe aortic stenosis    S/P prosthetic valve replacement w 25 mm Edwards like science percardial tissue valve,[place] - 01/2008  . Thrombocytopenia ([place])     ROS:   All systems reviewed and negative except as noted in the HPI.   Past Surgical History:  Procedure Laterality Date  . APPENDECTOMY    . BALLOON DILATION N/A 06/12/2019   Procedure:  BALLOON DILATION;  Surgeon: Ronnette Juniper, MD;  Location: [facility];  Service: Gastroenterology;  Laterality: N/A;  . BOTOX INJECTION N/A 01/22/2018   Procedure: BOTOX INJECTION;  Surgeon: Ronnette Juniper, MD;  Location: [facility];  Service: Gastroenterology;  Laterality: N/A;  . CARDIAC CATHETERIZATION     09  . CARDIAC VALVE REPLACEMENT  01/2008   aortic valve replacement  . CARDIOVERSION N/A 09/25/2019   Procedure: CARDIOVERSION;  Surgeon: Jerline Pain, MD;  Location: [facility] ENDOSCOPY;  Service: Cardiovascular;  Laterality: N/A;  . CATARACT EXTRACTION W/ INTRAOCULAR LENS  IMPLANT, BILATERAL    . COLON RESECTION     diverticulitis   . CORONARY ANGIOPLASTY    . dental implants      permanent  . ESOPHAGEAL MANOMETRY N/A 11/07/2017   Procedure: ESOPHAGEAL MANOMETRY (EM);  Surgeon: Ronnette Juniper, MD;  Location: [facility];  Service: Gastroenterology;  Laterality: N/A;  . ESOPHAGOGASTRODUODENOSCOPY (EGD) WITH PROPOFOL N/A 01/22/2018   Procedure: ESOPHAGOGASTRODUODENOSCOPY (EGD) WITH PROPOFOL;  Surgeon: Ronnette Juniper, MD;  Location: [facility];  Service: Gastroenterology;  Laterality: N/A;  . ESOPHAGOGASTRODUODENOSCOPY (EGD) WITH PROPOFOL N/A 04/30/2019   Procedure: ESOPHAGOGASTRODUODENOSCOPY (EGD) WITH PROPOFOL;  Surgeon: Laurence Spates, MD;  Location: [place];  Service: Endoscopy;  Laterality: N/A;  . ESOPHAGOGASTRODUODENOSCOPY (EGD) WITH PROPOFOL N/A 06/12/2019   Procedure: ESOPHAGOGASTRODUODENOSCOPY (EGD) WITH PROPOFOL;  Surgeon: Ronnette Juniper, MD;  Location: [facility];  Service: Gastroenterology;  Laterality: N/A;  with botox injection  . EXCISIONAL TOTAL SHOULDER ARTHROPLASTY WITH ANTIBIOTIC SPACER Right 12/31/2018   Procedure: EXCISIONAL TOTAL SHOULDER ARTHROPLASTY WITH ANTIBIOTIC SPACER;  Surgeon: Netta Cedars, MD;  Location: [place];  Service: Orthopedics;  Laterality: Right;  . HERNIA REPAIR    . INTRAOPERATIVE TRANSTHORACIC ECHOCARDIOGRAM N/A 04/15/2019   Procedure: Intraoperative Transthoracic Echocardiogram;  Surgeon: Sherren Mocha, MD;  Location: [place];  Service: Open Heart Surgery;  Laterality: N/A;  . IRRIGATION AND DEBRIDEMENT SHOULDER Right 11/20/2018    IRRIGATION AND DEBRIDEMENT SHOULDER WITH POLY EXCHANGE (Right Shoulder)  . IRRIGATION AND DEBRIDEMENT SHOULDER Right 11/20/2018   Procedure: IRRIGATION AND DEBRIDEMENT SHOULDER WITH POLY EXCHANGE;  Surgeon: Netta Cedars, MD;  Location: [place];  Service: Orthopedics;  Laterality: Right;  . LUMBAR LAMINECTOMY     x 2  . REVERSE SHOULDER ARTHROPLASTY Right 03/01/2018   Procedure: RIGHT REVERSE SHOULDER ARTHROPLASTY;  Surgeon: Netta Cedars, MD;  Location: [place];  Service: Orthopedics;  Laterality: Right;  .  REVERSE SHOULDER ARTHROPLASTY Right 07/18/2019   Procedure: REVERSE TOTAL SHOULDER ARTHROPLASTY and removal of antiobotic spacer;  Surgeon: Netta Cedars, MD;  Location: [facility];  Service: Orthopedics;  Laterality: Right;  interscalene block  . REVERSE SHOULDER ARTHROPLASTY Right 08/06/2019   Procedure: Reduction of dislocated reverse total shoulder and poly exchange;  Surgeon: Netta Cedars, MD;  Location: [facility];  Service: Orthopedics;  Laterality: Right;  need 1 hour  . RIGHT/LEFT HEART CATH AND CORONARY ANGIOGRAPHY N/A 04/02/2019   Procedure: RIGHT/LEFT HEART CATH AND CORONARY ANGIOGRAPHY;  Surgeon: Leonie Man, MD;  Location: [place] [facility];  Service: Cardiovascular;  Laterality: N/A;  . SAVORY DILATION N/A 01/22/2018   Procedure: SAVORY DILATION;  Surgeon: Ronnette Juniper, MD;  Location: [facility];  Service: Gastroenterology;  Laterality: N/A;  . SAVORY DILATION N/A 04/30/2019   Procedure: SAVORY DILATION;  Surgeon: Laurence Spates, MD;  Location: [place];  Service: Endoscopy;  Laterality: N/A;  With fluro  . SHOULDER HEMI-ARTHROPLASTY Right 06/14/2018   Procedure: RIGHT  REVERSE TOTAL SHOULDER OPEN POLY EXCHANGE;  Surgeon: Veverly Fells,  Richardson Landry, MD;  Location: [place];  Service: Orthopedics;  Laterality: Right;  . SUBMUCOSAL INJECTION  06/12/2019   Procedure: SUBMUCOSAL INJECTION;  Surgeon: Ronnette Juniper, MD;  Location: [facility];  Service: Gastroenterology;;  . TEE WITHOUT CARDIOVERSION N/A 01/29/2014   Procedure: TRANSESOPHAGEAL ECHOCARDIOGRAM (TEE);  Surgeon: Sueanne Margarita, MD;  Location: [facility]COPY;  Service: Cardiovascular;  Laterality: N/A;  . TEE WITHOUT CARDIOVERSION N/A 04/02/2019   Procedure: TRANSESOPHAGEAL ECHOCARDIOGRAM (TEE);  Surgeon: Josue Hector, MD;  [facility] END[facility];  Service: Cardiovascular;  Laterality: N/A;  . TEE WITHOUT CARDIOVERSION N/A 09/25/2019   Procedure: TRANSESOPHAGEAL ECHOCARDIOGRAM (TEE);  Surgeon: Jerline Pain, MD;  Location: [facility] ENDOSCOPY;   Service: Cardiovascular;  Laterality: N/A;  . TONSILLECTOMY    . TRANSCATHETER AORTIC VALVE REPLACEMENT, TRANSFEMORAL  04/15/2019  . TRANSCATHETER AORTIC VALVE REPLACEMENT, TRANSFEMORAL N/A 04/15/2019   Procedure: TRANSCATHETER AORTIC VALVE REPLACEMENT, TRANSFEMORAL with POST BALLOON DILATION;  Surgeon: Sherren Mocha, MD;  Location: [place];  Service: Open Heart Surgery;  Laterality: N/A;     Family History  Problem Relation Age of Onset  . Other Mother        NO HEALTH PROBLEMS  . Heart disease Father   . Arthritis Father   . Heart Problems Sister        RELATED TO A MVA  . Suicidality Brother   . Other Sister        [place]  . Other Brother        2 [place]  . Other Daughter        2 [place]     Social History   Socioeconomic History  . Marital status: Widowed    Spouse name: Not on file  . Number of children: 2  . Years of education: Not on file  . Highest education level: Not on file  Occupational History  . Occupation: Retired Market researcher at [employer]  . Smoking status: Former Smoker    Packs/day: 0.50    Years: 10.00    Pack years: 5.00    Types: Cigarettes    Start date: 01/16/1974    Quit date: 09/05/1983    Years since quitting: 36.1  . Smokeless tobacco: Former Systems developer    Types: Chew  Substance and Sexual Activity  . Alcohol use: Not Currently    Alcohol/week: 0.0 standard drinks  . Drug use: No  . Sexual activity: Not on file  Other Topics Concern  . Not on file  Social History Narrative  . Not on file   Social Determinants of Health   Financial Resource Strain:   . Difficulty of Paying Living Expenses: Not on file  Food Insecurity:   . Worried About Charity fundraiser in the Last Year: Not on file  . Ran Out of Food in the Last Year: Not on file  Transportation Needs:   . Lack of Transportation (Medical): Not on file  . Lack of Transportation (Non-Medical): Not on file  Physical Activity:   . Days of  Exercise per Week: Not on file  . Minutes of Exercise per Session: Not on file  Stress:   . Feeling of Stress : Not on file  Social Connections:   . Frequency of Communication with Friends and Family: Not on file  . Frequency of Social Gatherings with Friends and Family: Not on file  . Attends Religious Services: Not on file  . Active Member of Clubs or  Organizations: Not on file  . Attends Archivist Meetings: Not on file  . Marital Status: Not on file  Intimate Partner Violence:   . Fear of Current or Ex-Partner: Not on file  . Emotionally Abused: Not on file  . Physically Abused: Not on file  . Sexually Abused: Not on file     BP 122/66   Pulse 67   Ht 5\' 2"  (1.575 m)   Wt 108 lb (49 kg)   BMI 19.75 kg/m   Physical Exam:  Well appearing 85 yo man, NAD HEENT: Unremarkable Neck:  No JVD, no thyromegally Lymphatics:  No adenopathy Back:  No CVA tenderness Lungs:  Clear with no wheezes HEART:  Regular rate rhythm, no murmurs, no rubs, no clicks Abd:  soft, positive bowel sounds, no organomegally, no rebound, no guarding Ext:  2 plus pulses, no edema, no cyanosis, no clubbing Skin:  No rashes no nodules Neuro:  CN II through XII intact, motor grossly intact  EKG - nsr  DEVICE  Normal device function.  See PaceArt for details.   Assess/Plan: 1. Atrial flutter - I have discussed the indications/risks/benefits/goals/expectations of atrial flutter ablation and he wishes to proceed. 2. AS - he is s/p TAVR and doing well with no evidence of complication.  3. Coags - the patient might be able to come off eliquis after ablation.  Mikle Bosworth.D.

## 2019-10-10 ENCOUNTER — Encounter (HOSPITAL_COMMUNITY)
Admission: RE | Disposition: A | Discharge status: Home / Self Care | Payer: Self-pay | Source: Home / Self Care | Attending: Internal Medicine

## 2019-10-10 ENCOUNTER — Ambulatory Visit (HOSPITAL_COMMUNITY)
Admission: RE | Admit: 2019-10-10 | Discharge: 2019-10-10 | Disposition: A | Discharge status: Home / Self Care | Payer: Medicare PPO | Attending: Internal Medicine | Admitting: Internal Medicine

## 2019-10-10 ENCOUNTER — Other Ambulatory Visit: Payer: Self-pay

## 2019-10-10 DIAGNOSIS — I5032 Chronic diastolic (congestive) heart failure: Secondary | ICD-10-CM | POA: Diagnosis not present

## 2019-10-10 DIAGNOSIS — I483 Typical atrial flutter: Secondary | ICD-10-CM | POA: Diagnosis present

## 2019-10-10 DIAGNOSIS — I4892 Unspecified atrial flutter: Secondary | ICD-10-CM | POA: Diagnosis present

## 2019-10-10 DIAGNOSIS — M069 Rheumatoid arthritis, unspecified: Secondary | ICD-10-CM | POA: Diagnosis not present

## 2019-10-10 DIAGNOSIS — Z7901 Long term (current) use of anticoagulants: Secondary | ICD-10-CM | POA: Diagnosis not present

## 2019-10-10 DIAGNOSIS — K219 Gastro-esophageal reflux disease without esophagitis: Secondary | ICD-10-CM | POA: Diagnosis not present

## 2019-10-10 DIAGNOSIS — Z953 Presence of xenogenic heart valve: Secondary | ICD-10-CM | POA: Insufficient documentation

## 2019-10-10 DIAGNOSIS — Z79899 Other long term (current) drug therapy: Secondary | ICD-10-CM | POA: Insufficient documentation

## 2019-10-10 DIAGNOSIS — Z87891 Personal history of nicotine dependence: Secondary | ICD-10-CM | POA: Diagnosis not present

## 2019-10-10 DIAGNOSIS — I110 Hypertensive heart disease with heart failure: Secondary | ICD-10-CM | POA: Insufficient documentation

## 2019-10-10 HISTORY — PX: A-FLUTTER ABLATION: EP1230

## 2019-10-10 LAB — CBC
HCT: 30.2 % — ABNORMAL LOW (ref 39.0–52.0)
Hemoglobin: 9.1 g/dL — ABNORMAL LOW (ref 13.0–17.0)
MCH: 21.6 pg — ABNORMAL LOW (ref 26.0–34.0)
MCHC: 30.1 g/dL (ref 30.0–36.0)
MCV: 71.7 fL — ABNORMAL LOW (ref 80.0–100.0)
Platelets: 88 10*3/uL — ABNORMAL LOW (ref 150–400)
RBC: 4.21 MIL/uL — ABNORMAL LOW (ref 4.22–5.81)
RDW: 17.5 % — ABNORMAL HIGH (ref 11.5–15.5)
WBC: 5.5 10*3/uL (ref 4.0–10.5)
nRBC: 0.4 % — ABNORMAL HIGH (ref 0.0–0.2)

## 2019-10-10 SURGERY — A-FLUTTER ABLATION

## 2019-10-10 MED ORDER — BUPIVACAINE HCL (PF) 0.25 % IJ SOLN
INTRAMUSCULAR | Status: AC
  Filled 2019-10-10: qty 30

## 2019-10-10 MED ORDER — FENTANYL CITRATE (PF) 100 MCG/2ML IJ SOLN
INTRAMUSCULAR | Status: AC
  Filled 2019-10-10: qty 2

## 2019-10-10 MED ORDER — FENTANYL CITRATE (PF) 100 MCG/2ML IJ SOLN
INTRAMUSCULAR | Status: DC | PRN
  Administered 2019-10-10 (×3): 25 ug via INTRAVENOUS
  Administered 2019-10-10 (×2): 12.5 ug via INTRAVENOUS
  Administered 2019-10-10: 25 ug via INTRAVENOUS
  Administered 2019-10-10 (×2): 12.5 ug via INTRAVENOUS

## 2019-10-10 MED ORDER — HEPARIN (PORCINE) IN NACL 1000-0.9 UT/500ML-% IV SOLN
INTRAVENOUS | Status: AC
  Filled 2019-10-10: qty 500

## 2019-10-10 MED ORDER — IBUTILIDE FUMARATE 1 MG/10ML IV SOLN
INTRAVENOUS | Status: AC
  Filled 2019-10-10: qty 10

## 2019-10-10 MED ORDER — ONDANSETRON HCL 4 MG/2ML IJ SOLN: 4.0000 mg | Freq: Four times a day (QID) | INTRAMUSCULAR | Status: DC | PRN

## 2019-10-10 MED ORDER — METOPROLOL TARTRATE 5 MG/5ML IV SOLN
INTRAVENOUS | Status: DC | PRN
  Administered 2019-10-10: 5 mg via INTRAVENOUS

## 2019-10-10 MED ORDER — HEPARIN (PORCINE) IN NACL 1000-0.9 UT/500ML-% IV SOLN
INTRAVENOUS | Status: DC | PRN
  Administered 2019-10-10: 500 mL

## 2019-10-10 MED ORDER — MIDAZOLAM HCL 5 MG/5ML IJ SOLN
INTRAMUSCULAR | Status: AC
  Filled 2019-10-10: qty 5

## 2019-10-10 MED ORDER — SODIUM CHLORIDE 0.9% FLUSH: 3.0000 mL | INTRAVENOUS | Status: DC | PRN

## 2019-10-10 MED ORDER — MIDAZOLAM HCL 5 MG/5ML IJ SOLN
INTRAMUSCULAR | Status: DC | PRN
  Administered 2019-10-10: 2 mg via INTRAVENOUS
  Administered 2019-10-10 (×2): 1 mg via INTRAVENOUS
  Administered 2019-10-10 (×2): 2 mg via INTRAVENOUS
  Administered 2019-10-10 (×2): 1 mg via INTRAVENOUS
  Administered 2019-10-10: 2 mg via INTRAVENOUS

## 2019-10-10 MED ORDER — METOPROLOL TARTRATE 5 MG/5ML IV SOLN
INTRAVENOUS | Status: AC
  Filled 2019-10-10: qty 5

## 2019-10-10 MED ORDER — ACETAMINOPHEN 325 MG PO TABS
650.0000 mg | ORAL_TABLET | ORAL | Status: DC | PRN
  Administered 2019-10-10: 15:00:00 650 mg via ORAL
  Filled 2019-10-10 (×3): qty 2

## 2019-10-10 MED ORDER — MAGNESIUM SULFATE 50 % IJ SOLN
INTRAMUSCULAR | Status: DC | PRN
  Administered 2019-10-10: 1 g via INTRAVENOUS

## 2019-10-10 MED ORDER — SODIUM CHLORIDE 0.9% FLUSH: 3.0000 mL | Freq: Two times a day (BID) | INTRAVENOUS | Status: DC

## 2019-10-10 MED ORDER — SODIUM CHLORIDE 0.9 % IV SOLN: 250.0000 mL | INTRAVENOUS | Status: DC | PRN

## 2019-10-10 MED ORDER — DEXTROSE 5 % IV SOLN
INTRAVENOUS | Status: AC
  Filled 2019-10-10: qty 50

## 2019-10-10 MED ORDER — DEXTROSE 5 % IV SOLN
INTRAVENOUS | Status: AC
  Filled 2019-10-10: qty 100

## 2019-10-10 MED ORDER — BUPIVACAINE HCL (PF) 0.25 % IJ SOLN
INTRAMUSCULAR | Status: DC | PRN
  Administered 2019-10-10: 30 mL

## 2019-10-10 MED ORDER — MAGNESIUM SULFATE 50 % IJ SOLN
INTRAMUSCULAR | Status: AC
  Filled 2019-10-10: qty 2

## 2019-10-10 MED ORDER — IBUTILIDE FUMARATE 1 MG/10ML IV SOLN
INTRAVENOUS | Status: AC | PRN
  Administered 2019-10-10: 1 mg via INTRAVENOUS

## 2019-10-10 MED ORDER — SODIUM CHLORIDE 0.9 % IV SOLN: INTRAVENOUS | Status: DC

## 2019-10-10 SURGICAL SUPPLY — 10 items
CATH BLAZERPRIME XP LG CV 10MM (ABLATOR) ×3 IMPLANT
CATH JOSEPH QUAD ALLRED 6F REP (CATHETERS) ×3 IMPLANT
CATH POLARIS X 2.5/5/2.5 DECAP (CATHETERS) ×3 IMPLANT
COVER DOME SNAP 22 D (MISCELLANEOUS) ×3 IMPLANT
PACK EP LATEX FREE (CUSTOM PROCEDURE TRAY) ×2
PACK EP LF (CUSTOM PROCEDURE TRAY) ×1 IMPLANT
PAD PRO RADIOLUCENT 2001M-C (PAD) ×3 IMPLANT
SHEATH PINNACLE 6F 10CM (SHEATH) ×6 IMPLANT
SHEATH PINNACLE 8F 10CM (SHEATH) ×3 IMPLANT
SHIELD RADPAD SCOOP 12X17 (MISCELLANEOUS) ×3 IMPLANT

## 2019-10-10 NOTE — Progress Notes (Signed)
Site area: rt groin fv sheaths x3 Site Prior to Removal:  Level 0 Pressure Applied For: 20 minutes Manual:   yes Patient Status During Pull:  stable Post Pull Site:  Level 0 Post Pull Instructions Given:  yes Post Pull Pulses Present: rt dp palpable Dressing Applied:  Gauze and tegaderm Bedrest begins @ [id number] Comments: IV saline locked

## 2019-10-10 NOTE — Discharge Instructions (Signed)
Post procedure care instructions No driving for 4 days. No lifting over 5 lbs for 1 week. No vigorous or sexual activity for 1 week. You may return to work/your usual activities on 10/17/2019. Keep procedure site clean & dry. If you notice increased pain, swelling, bleeding or pus, call/return!  You may shower, but no soaking baths/hot tubs/pools for 1 week.      Femoral Site Care This sheet gives you information about how to care for yourself after your procedure. Your health care provider may also give you more specific instructions. If you have problems or questions, contact your health care provider. What can I expect after the procedure? After the procedure, it is common to have:  Bruising that usually fades within 1-2 weeks.  Tenderness at the site. Follow these instructions at home: Wound care  Follow instructions from your health care provider about how to take care of your insertion site. Make sure you: ? Wash your hands with soap and water before you change your bandage (dressing). If soap and water are not available, use hand sanitizer. ? Change your dressing as told by your health care provider. ? Leave stitches (sutures), skin glue, or adhesive strips in place. These skin closures may need to stay in place for 2 weeks or longer. If adhesive strip edges start to loosen and curl up, you may trim the loose edges. Do not remove adhesive strips completely unless your health care provider tells you to do that.  Do not take baths, swim, or use a hot tub until your health care provider approves.  You may shower 24-48 hours after the procedure or as told by your health care provider. ? Gently wash the site with plain soap and water. ? Pat the area dry with a clean towel. ? Do not rub the site. This may cause bleeding.  Do not apply powder or lotion to the site. Keep the site clean and dry.  Check your femoral site every day for signs of infection. Check for: ? Redness, swelling, or  pain. ? Fluid or blood. ? Warmth. ? Pus or a bad smell. Activity  For the first 2-3 days after your procedure, or as long as directed: ? Avoid climbing stairs as much as possible. ? Do not squat.  Do not lift anything that is heavier than 10 lb (4.5 kg), or the limit that you are told, until your health care provider says that it is safe.  Rest as directed. ? Avoid sitting for a long time without moving. Get up to take short walks every 1-2 hours.  Do not drive for 24 hours if you were given a medicine to help you relax (sedative). General instructions  Take over-the-counter and prescription medicines only as told by your health care provider.  Keep all follow-up visits as told by your health care provider. This is important. Contact a health care provider if you have:  A fever or chills.  You have redness, swelling, or pain around your insertion site. Get help right away if:  The catheter insertion area swells very fast.  You pass out.  You suddenly start to sweat or your skin gets clammy.  The catheter insertion area is bleeding, and the bleeding does not stop when you hold steady pressure on the area.  The area near or just beyond the catheter insertion site becomes pale, cool, tingly, or numb. These symptoms may represent a serious problem that is an emergency. Do not wait to see if the symptoms  will go away. Get medical help right away. Call your local emergency services (911 in the [place] Do not drive yourself to the hospital. Summary  After the procedure, it is common to have bruising that usually fades within 1-2 weeks.  Check your femoral site every day for signs of infection.  Do not lift anything that is heavier than 10 lb (4.5 kg), or the limit that you are told, until your health care provider says that it is safe. This information is not intended to replace advice given to you by your health care provider. Make sure you discuss any questions you have with  your health care provider. Document Revised: 09/03/2017 Document Reviewed: 09/03/2017 Elsevier Patient Education  2020 [employer].

## 2019-10-10 NOTE — Progress Notes (Signed)
Joshua Hudson, patient's contact, updated post procedure.

## 2019-10-10 NOTE — Interval H&P Note (Signed)
History and Physical Interval Note:  10/10/2019 9:01 AM  Joshua Hudson  has presented today for surgery, with the diagnosis of aflutter.  The various methods of treatment have been discussed with the patient and family. After consideration of risks, benefits and other options for treatment, the patient has consented to  Procedure(s): A-FLUTTER ABLATION (N/A) as a surgical intervention.  The patient's history has been reviewed, patient examined, no change in status, stable for surgery.  I have reviewed the patient's chart and labs.  Questions were answered to the patient's satisfaction.     [name]

## 2019-10-10 NOTE — Progress Notes (Signed)
PA Renee paged regarding patient's left lateral abdominal pain and next dose of eloquis. PA to come to bedside to see patient.

## 2019-10-13 NOTE — Progress Notes (Signed)
Instructions given to patient:  Take it easy for 2 days,  no heavy lifting, pulling or straining for 1 week. Ok to shower the day after procedure Ok to drive on 4th day post procedure Resume normal activities after 1 week If you notice any bleeding, swelling or fevers call office.  Ok to have some bruising or tenderness post procedure. Resume anticoagulant 2/5  Call clinic if you have any questions.

## 2019-10-22 ENCOUNTER — Ambulatory Visit: Payer: Medicare PPO | Admitting: Physician Assistant

## 2019-11-11 ENCOUNTER — Encounter: Payer: Self-pay | Admitting: Internal Medicine

## 2019-11-11 ENCOUNTER — Ambulatory Visit: Payer: Medicare PPO | Admitting: Internal Medicine

## 2019-11-11 ENCOUNTER — Other Ambulatory Visit: Payer: Self-pay

## 2019-11-11 VITALS — BP 102/70 | HR 120 | Ht 62.0 in | Wt 108.6 lb

## 2019-11-11 DIAGNOSIS — I4892 Unspecified atrial flutter: Secondary | ICD-10-CM

## 2019-11-11 MED ORDER — AMIODARONE HCL 200 MG PO TABS: 200.0000 mg | ORAL_TABLET | Freq: Every day | ORAL | 3 refills | Status: DC

## 2019-11-11 NOTE — Progress Notes (Signed)
HPI Joshua Hudson returns today for followup. He is a pleasant 85 yo man with a h/o atrial flutter who underwent EP study and ablation of atrial flutter a couple of weeks ago. He has done well in the interim. He has not experienced palpitations.  No Known Allergies   Current Outpatient Medications  Medication Sig Dispense Refill  . apixaban (ELIQUIS) 2.5 MG TABS tablet Take 1 tablet (2.5 mg total) by mouth 2 (two) times daily. 120 tablet 0  . finasteride (PROSCAR) 5 MG tablet Take 5 mg by mouth daily as needed (retention).     . folic acid (FOLVITE) 1 MG tablet Take 1 mg by mouth daily.    . furosemide (LASIX) 40 MG tablet Take 1 tablet (40 mg total) by mouth daily. 60 tablet 0  . metoprolol tartrate (LOPRESSOR) 25 MG tablet Take 0.5 tablets (12.5 mg total) by mouth 2 (two) times daily. 60 tablet 0  . Multiple Vitamin (MULTIVITAMIN WITH MINERALS) TABS tablet Take 1 tablet by mouth daily.    . pantoprazole (PROTONIX) 40 MG tablet Take 40 mg by mouth daily before breakfast.     . potassium chloride SA (KLOR-CON) 20 MEQ tablet Take 1 tablet (20 mEq total) by mouth daily. 60 tablet 0  . predniSONE (DELTASONE) 5 MG tablet Take 5 mg by mouth daily with breakfast.      No current facility-administered medications for this visit.     Past Medical History:  Diagnosis Date  . Achalasia 06/12/2019   Noted on EGD  . Aortic insufficiency 03/24/2019   AI of bioprosthetic AVR severe 4 plus  . Chronic diastolic CHF (congestive heart failure) ([place]) 03/24/2019  . Colon polyps    s/p diverticular perforation requiring 2-stage repair  . Derangement of right shoulder joint    need replacing has no use of  . Diverticulosis   . DJD (degenerative joint disease), lumbosacral   . Dysphagia    eats soft food  . Esophageal stricture   . GERD (gastroesophageal reflux disease)   . Hemolytic anemia ([place])   . History of blood transfusion given with 04-15-19 surgery  . History of blood transfusion  07/18/2019  . History of kidney stones    passed stones  . Hypertension   . Mitral regurgitation    moderate  . Occipital neuralgia   . Pancytopenia ([place])   . Postoperative atrial fibrillation ([place]) 05/10/2015  . Prosthetic valve dysfunction   . Rheumatoid arthritis ([place])    s/o long term steroids  shoulders and hands  . Rotator cuff arthropathy    right  . S/P aortic valve replacement with bioprosthetic valve 01/16/2008   76mm Edwards Magna perimount bovine pericardial tissue valve, model 3000  . S/P valve-in-valve TAVR 04/15/2019   26 mm Edwards Sapien 3 Ultra transcatheter heart valve placed via percutaneous right transfemoral approach   . SBE (subacute bacterial endocarditis) prophylaxis candidate    for dental procedures  . Schatzki's ring 06/12/2019   Narrowing, Noted on EGD  . Severe aortic stenosis    S/P prosthetic valve replacement w 25 mm Edwards like science percardial tissue valve,[place] - 01/2008  . Thrombocytopenia ([place])     ROS:   All systems reviewed and negative except as noted in the HPI.   Past Surgical History:  Procedure Laterality Date  . A-FLUTTER ABLATION N/A 10/10/2019   Procedure: A-FLUTTER ABLATION;  Surgeon: Evans Lance, MD;  Location: [place];  Service: Cardiovascular;  Laterality: N/A;  .  APPENDECTOMY    . BALLOON DILATION N/A 06/12/2019   Procedure: BALLOON DILATION;  Surgeon: Ronnette Juniper, MD;  Location: [facility];  Service: Gastroenterology;  Laterality: N/A;  . BOTOX INJECTION N/A 01/22/2018   Procedure: BOTOX INJECTION;  Surgeon: Ronnette Juniper, MD;  Location: [facility];  Service: Gastroenterology;  Laterality: N/A;  . CARDIAC CATHETERIZATION     09  . CARDIAC VALVE REPLACEMENT  01/2008   aortic valve replacement  . CARDIOVERSION N/A 09/25/2019   Procedure: CARDIOVERSION;  Surgeon: Jerline Pain, MD;  Location: [facility];  Service: Cardiovascular;  Laterality: N/A;  . CATARACT EXTRACTION W/ INTRAOCULAR LENS  IMPLANT,  BILATERAL    . COLON RESECTION     diverticulitis   . CORONARY ANGIOPLASTY    . dental implants     permanent  . ESOPHAGEAL MANOMETRY N/A 11/07/2017   Procedure: ESOPHAGEAL MANOMETRY (EM);  Surgeon: Ronnette Juniper, MD;  Location: [facility];  Service: Gastroenterology;  Laterality: N/A;  . ESOPHAGOGASTRODUODENOSCOPY (EGD) WITH PROPOFOL N/A 01/22/2018   Procedure: ESOPHAGOGASTRODUODENOSCOPY (EGD) WITH PROPOFOL;  Surgeon: Ronnette Juniper, MD;  [facility]: [facility];  Service: Gastroenterology;  Laterality: N/A;  . ESOPHAGOGASTRODUODENOSCOPY (EGD) WITH PROPOFOL N/A 04/30/2019   Procedure: ESOPHAGOGASTRODUODENOSCOPY (EGD) WITH PROPOFOL;  Surgeon: Laurence Spates, MD;  Location: [place];  Service: Endoscopy;  Laterality: N/A;  . ESOPHAGOGASTRODUODENOSCOPY (EGD) WITH PROPOFOL N/A 06/12/2019   Procedure: ESOPHAGOGASTRODUODENOSCOPY (EGD) WITH PROPOFOL;  Surgeon: Ronnette Juniper, MD;  [facility];  Service: Gastroenterology;  Laterality: N/A;  with botox injection  . EXCISIONAL TOTAL SHOULDER ARTHROPLASTY WITH ANTIBIOTIC SPACER Right 12/31/2018   Procedure: EXCISIONAL TOTAL SHOULDER ARTHROPLASTY WITH ANTIBIOTIC SPACER;  Surgeon: Netta Cedars, MD;  Location: [place];  Service: Orthopedics;  Laterality: Right;  . HERNIA REPAIR    . INTRAOPERATIVE TRANSTHORACIC ECHOCARDIOGRAM N/A 04/15/2019   Procedure: Intraoperative Transthoracic Echocardiogram;  Surgeon: Sherren Mocha, MD;  Location: [place];  Service: Open Heart Surgery;  Laterality: N/A;  . IRRIGATION AND DEBRIDEMENT SHOULDER Right 11/20/2018    IRRIGATION AND DEBRIDEMENT SHOULDER WITH POLY EXCHANGE (Right Shoulder)  . IRRIGATION AND DEBRIDEMENT SHOULDER Right 11/20/2018   Procedure: IRRIGATION AND DEBRIDEMENT SHOULDER WITH POLY EXCHANGE;  Surgeon: Netta Cedars, MD;  Location: [place];  Service: Orthopedics;  Laterality: Right;  . LUMBAR LAMINECTOMY     x 2  . REVERSE SHOULDER ARTHROPLASTY Right 03/01/2018   Procedure: RIGHT REVERSE SHOULDER  ARTHROPLASTY;  Surgeon: Netta Cedars, MD;  Location: [place];  Service: Orthopedics;  Laterality: Right;  . REVERSE SHOULDER ARTHROPLASTY Right 07/18/2019   Procedure: REVERSE TOTAL SHOULDER ARTHROPLASTY and removal of antiobotic spacer;  Surgeon: Netta Cedars, MD;  [facility]: [facility];  Service: Orthopedics;  Laterality: Right;  interscalene block  . REVERSE SHOULDER ARTHROPLASTY Right 08/06/2019   Procedure: Reduction of dislocated reverse total shoulder and poly exchange;  Surgeon: Netta Cedars, MD;  Location: [facility];  Service: Orthopedics;  Laterality: Right;  need 1 hour  . RIGHT/LEFT HEART CATH AND CORONARY ANGIOGRAPHY N/A 04/02/2019   Procedure: RIGHT/LEFT HEART CATH AND CORONARY ANGIOGRAPHY;  Surgeon: Leonie Man, MD;  Location: [place] [facility];  Service: Cardiovascular;  Laterality: N/A;  . SAVORY DILATION N/A 01/22/2018   Procedure: SAVORY DILATION;  Surgeon: Ronnette Juniper, MD;  [facility];  Service: Gastroenterology;  Laterality: N/A;  . SAVORY DILATION N/A 04/30/2019   Procedure: SAVORY DILATION;  Surgeon: Laurence Spates, MD;  Location: [place];  Service: Endoscopy;  Laterality: N/A;  With fluro  . SHOULDER HEMI-ARTHROPLASTY Right 06/14/2018  Procedure: RIGHT  REVERSE TOTAL SHOULDER OPEN POLY EXCHANGE;  Surgeon: Netta Cedars, MD;  Location: [place];  Service: Orthopedics;  Laterality: Right;  . SUBMUCOSAL INJECTION  06/12/2019   Procedure: SUBMUCOSAL INJECTION;  Surgeon: Ronnette Juniper, MD;  Location: [facility];  Service: Gastroenterology;;  . TEE WITHOUT CARDIOVERSION N/A 01/29/2014   Procedure: TRANSESOPHAGEAL ECHOCARDIOGRAM (TEE);  Surgeon: Sueanne Margarita, MD;  Location: [facility] ENDOSCOPY;  Service: Cardiovascular;  Laterality: N/A;  . TEE WITHOUT CARDIOVERSION N/A 04/02/2019   Procedure: TRANSESOPHAGEAL ECHOCARDIOGRAM (TEE);  Surgeon: Josue Hector, MD;  Location: [facility];  Service: Cardiovascular;  Laterality: N/A;  . TEE WITHOUT CARDIOVERSION N/A 09/25/2019    Procedure: TRANSESOPHAGEAL ECHOCARDIOGRAM (TEE);  Surgeon: Jerline Pain, MD;  Location: [facility]COPY;  Service: Cardiovascular;  Laterality: N/A;  . TONSILLECTOMY    . TRANSCATHETER AORTIC VALVE REPLACEMENT, TRANSFEMORAL  04/15/2019  . TRANSCATHETER AORTIC VALVE REPLACEMENT, TRANSFEMORAL N/A 04/15/2019   Procedure: TRANSCATHETER AORTIC VALVE REPLACEMENT, TRANSFEMORAL with POST BALLOON DILATION;  Surgeon: Sherren Mocha, MD;  Location: [place];  Service: Open Heart Surgery;  Laterality: N/A;     Family History  Problem Relation Age of Onset  . Other Mother        NO HEALTH PROBLEMS  . Heart disease Father   . Arthritis Father   . Heart Problems Sister        RELATED TO A MVA  . Suicidality Brother   . Other Sister        [place]  . Other Brother        2 [place]  . Other Daughter        2 [place]     Social History   Socioeconomic History  . Marital status: Widowed    Spouse name: Not on file  . Number of children: 2  . Years of education: Not on file  . Highest education level: Not on file  Occupational History  . Occupation: Retired Market researcher at [employer]  . Smoking status: Former Smoker    Packs/day: 0.50    Years: 10.00    Pack years: 5.00    Types: Cigarettes    Start date: 01/16/1974    Quit date: 09/05/1983    Years since quitting: 36.2  . Smokeless tobacco: Former Systems developer    Types: Chew  Substance and Sexual Activity  . Alcohol use: Not Currently    Alcohol/week: 0.0 standard drinks  . Drug use: No  . Sexual activity: Not on file  Other Topics Concern  . Not on file  Social History Narrative  . Not on file   Social Determinants of Health   Financial Resource Strain:   . Difficulty of Paying Living Expenses: Not on file  Food Insecurity:   . Worried About Charity fundraiser in the Last Year: Not on file  . Ran Out of Food in the Last Year: Not on file  Transportation Needs:   . Lack of Transportation  (Medical): Not on file  . Lack of Transportation (Non-Medical): Not on file  Physical Activity:   . Days of Exercise per Week: Not on file  . Minutes of Exercise per Session: Not on file  Stress:   . Feeling of Stress : Not on file  Social Connections:   . Frequency of Communication with Friends and Family: Not on file  . Frequency of Social Gatherings with Friends and Family: Not on file  . Attends  Religious Services: Not on file  . Active Member of Clubs or Organizations: Not on file  . Attends Archivist Meetings: Not on file  . Marital Status: Not on file  Intimate Partner Violence:   . Fear of Current or Ex-Partner: Not on file  . Emotionally Abused: Not on file  . Physically Abused: Not on file  . Sexually Abused: Not on file     BP 102/70   Pulse (!) 120   Ht 5\' 2"  (1.575 m)   Wt 108 lb 9.6 oz (49.3 kg)   SpO2 90%   BMI 19.86 kg/m   Physical Exam:  Well appearing NAD HEENT: Unremarkable Neck:  No JVD, no thyromegally Lymphatics:  No adenopathy Back:  No CVA tenderness Lungs:  Clear with no wheezes HEART:  Regular rate rhythm, no murmurs, no rubs, no clicks Abd:  soft, positive bowel sounds, no organomegally, no rebound, no guarding Ext:  2 plus pulses, no edema, no cyanosis, no clubbing Skin:  No rashes no nodules Neuro:  CN II through XII intact, motor grossly intact  EKG - atrial fib/flutter with a RVR   Assess/Plan: 1. Atrial fib/flutter - I discussed the treatment options with the patient. I have recommended a trial of amiodarone. If he does not convert to NSR, then I would anticipate DCCV. If he cannot maintain NSR, then AV node ablation and PPM would be another option. He appears to have LA flutter. I do not think trying to ablate this would be of much benefit. 2. coags - he will continue his eliquis.   Joshua Hudson.D.

## 2019-11-11 NOTE — Patient Instructions (Addendum)
Medication Instructions:  Your physician has recommended you make the following change in your medication:  1.  Start taking amiodarone 200 mg--Take one tablet by mouth daily.  Labwork: None ordered.  Testing/Procedures: None ordered.  Follow-Up: Your physician wants you to follow-up with Dr. Lovena Le:    December 19, 2019 at 8:00 am.  Arrive at 7:45 am to check in.   Any Other Special Instructions Will Be Listed Below (If Applicable).  If you need a refill on your cardiac medications before your next appointment, please call your pharmacy.   Amiodarone tablets What is this medicine? AMIODARONE (a MEE oh da rone) is an antiarrhythmic drug. It helps make your heart beat regularly. Because of the side effects caused by this medicine, it is only used when other medicines have not worked. It is usually used for heartbeat problems that may be life threatening. This medicine may be used for other purposes; ask your health care provider or pharmacist if you have questions. COMMON BRAND NAME(S): Cordarone, Pacerone What should I tell my health care provider before I take this medicine? They need to know if you have any of these conditions:  liver disease  lung disease  other heart problems  thyroid disease  an unusual or allergic reaction to amiodarone, iodine, other medicines, foods, dyes, or preservatives  pregnant or trying to get pregnant  breast-feeding How should I use this medicine? Take this medicine by mouth with a glass of water. Follow the directions on the prescription label. You can take this medicine with or without food. However, you should always take it the same way each time. Take your doses at regular intervals. Do not take your medicine more often than directed. Do not stop taking except on the advice of your doctor or health care professional. A special MedGuide will be given to you by the pharmacist with each prescription and refill. Be sure to read this  information carefully each time. Talk to your pediatrician regarding the use of this medicine in children. Special care may be needed. Overdosage: If you think you have taken too much of this medicine contact a poison control center or emergency room at once. NOTE: This medicine is only for you. Do not share this medicine with others. What if I miss a dose? If you miss a dose, take it as soon as you can. If it is almost time for your next dose, take only that dose. Do not take double or extra doses. What may interact with this medicine? Do not take this medicine with any of the following medications:  abarelix  apomorphine  arsenic trioxide  certain antibiotics like erythromycin, gemifloxacin, levofloxacin, pentamidine  certain medicines for depression like amoxapine, tricyclic antidepressants  certain medicines for fungal infections like fluconazole, itraconazole, ketoconazole, posaconazole, voriconazole  certain medicines for irregular heart beat like disopyramide, dronedarone, ibutilide, propafenone, sotalol  certain medicines for malaria like chloroquine, halofantrine  cisapride  droperidol  haloperidol  [place]  maprotiline  methadone  phenothiazines like chlorpromazine, mesoridazine, thioridazine  pimozide  ranolazine  red yeast rice  vardenafil This medicine may also interact with the following medications:  antiviral medicines for HIV or AIDS  certain medicines for blood pressure, heart disease, irregular heart beat  certain medicines for cholesterol like atorvastatin, cerivastatin, lovastatin, simvastatin  certain medicines for hepatitis C like sofosbuvir and ledipasvir; sofosbuvir  certain medicines for seizures like phenytoin  certain medicines for thyroid problems  certain medicines that treat or prevent blood clots like  warfarin  cholestyramine  cimetidine  clopidogrel  cyclosporine  dextromethorphan  diuretics  dofetilide  fentanyl  general anesthetics  grapefruit juice  lidocaine  loratadine  methotrexate  other medicines that prolong the QT interval (cause an abnormal heart rhythm)  procainamide  quinidine  rifabutin, rifampin, or rifapentine  St. John's Wort  trazodone  ziprasidone This list may not describe all possible interactions. Give your health care provider a list of all the medicines, herbs, non-prescription drugs, or dietary supplements you use. Also tell them if you smoke, drink alcohol, or use illegal drugs. Some items may interact with your medicine. What should I watch for while using this medicine? Your condition will be monitored closely when you first begin therapy. Often, this drug is first started in a hospital or other monitored health care setting. Once you are on maintenance therapy, visit your doctor or health care professional for regular checks on your progress. Because your condition and use of this medicine carry some risk, it is a good idea to carry an identification card, necklace or bracelet with details of your condition, medications, and doctor or health care professional. Dennis Bast may get drowsy or dizzy. Do not drive, use machinery, or do anything that needs mental alertness until you know how this medicine affects you. Do not stand or sit up quickly, especially if you are an older patient. This reduces the risk of dizzy or fainting spells. This medicine can make you more sensitive to the sun. Keep out of the sun. If you cannot avoid being in the sun, wear protective clothing and use sunscreen. Do not use sun lamps or tanning beds/booths. You should have regular eye exams before and during treatment. Call your doctor if you have blurred vision, see halos, or your eyes become sensitive to light. Your eyes may get dry. It may be helpful to use a  lubricating eye solution or artificial tears solution. If you are going to have surgery or a procedure that requires contrast dyes, tell your doctor or health care professional that you are taking this medicine. What side effects may I notice from receiving this medicine? Side effects that you should report to your doctor or health care professional as soon as possible:  allergic reactions like skin rash, itching or hives, swelling of the face, lips, or tongue  blue-gray coloring of the skin  blurred vision, seeing blue green halos, increased sensitivity of the eyes to light  breathing problems  chest pain  dark urine  fast, irregular heartbeat  feeling faint or light-headed  intolerance to heat or cold  nausea or vomiting  pain and swelling of the scrotum  pain, tingling, numbness in feet, hands  redness, blistering, peeling or loosening of the skin, including inside the mouth  spitting up blood  stomach pain  sweating  unusual or uncontrolled movements of body  unusually weak or tired  weight gain or loss  yellowing of the eyes or skin Side effects that usually do not require medical attention (report to your doctor or health care professional if they continue or are bothersome):  change in sex drive or performance  constipation  dizziness  headache  loss of appetite  trouble sleeping This list may not describe all possible side effects. Call your doctor for medical advice about side effects. You may report side effects to FDA at 1-800-FDA-1088. Where should I keep my medicine? Keep out of the reach of children. Store at room temperature between 20 and 25 degrees C (68 and 77  degrees F). Protect from light. Keep container tightly closed. Throw away any unused medicine after the expiration date. NOTE: This sheet is a summary. It may not cover all possible information. If you have questions about this medicine, talk to your doctor, pharmacist, or health  care provider.  2020 Elsevier/Gold Standard (2018-07-24 13:44:04)

## 2019-11-27 ENCOUNTER — Other Ambulatory Visit: Payer: Self-pay

## 2019-11-27 MED ORDER — METOPROLOL TARTRATE 25 MG PO TABS: 12.5000 mg | ORAL_TABLET | Freq: Two times a day (BID) | ORAL | 6 refills | Status: DC

## 2019-11-27 MED ORDER — APIXABAN 2.5 MG PO TABS: 2.5000 mg | ORAL_TABLET | Freq: Two times a day (BID) | ORAL | 1 refills | Status: DC

## 2019-11-27 NOTE — Telephone Encounter (Signed)
Prescription refill request for Eliquis received.  Last office visit: Lovena Le 11/11/2019 Scr: 1.14, 10/03/2019 Age: 85 y.o Weight: 49.3 kg   Prescription refill sent.

## 2019-12-17 DIAGNOSIS — Z79899 Other long term (current) drug therapy: Secondary | ICD-10-CM | POA: Diagnosis not present

## 2019-12-17 DIAGNOSIS — M0579 Rheumatoid arthritis with rheumatoid factor of multiple sites without organ or systems involvement: Secondary | ICD-10-CM | POA: Diagnosis not present

## 2019-12-19 ENCOUNTER — Encounter: Payer: Self-pay | Admitting: Internal Medicine

## 2019-12-19 ENCOUNTER — Other Ambulatory Visit: Payer: Self-pay

## 2019-12-19 ENCOUNTER — Ambulatory Visit: Payer: Medicare PPO | Admitting: Internal Medicine

## 2019-12-19 VITALS — BP 108/64 | HR 111 | Ht 62.5 in | Wt 110.0 lb

## 2019-12-19 DIAGNOSIS — Z01810 Encounter for preprocedural cardiovascular examination: Secondary | ICD-10-CM | POA: Diagnosis not present

## 2019-12-19 DIAGNOSIS — I484 Atypical atrial flutter: Secondary | ICD-10-CM | POA: Diagnosis not present

## 2019-12-19 LAB — BASIC METABOLIC PANEL
BUN/Creatinine Ratio: 12 (ref 10–24)
BUN: 17 mg/dL (ref 8–27)
CO2: 30 mmol/L — ABNORMAL HIGH (ref 20–29)
Calcium: 9 mg/dL (ref 8.6–10.2)
Chloride: 104 mmol/L (ref 96–106)
Creatinine, Ser: 1.39 mg/dL — ABNORMAL HIGH (ref 0.76–1.27)
GFR calc Af Amer: 54 mL/min/{1.73_m2} — ABNORMAL LOW (ref >59)
GFR calc non Af Amer: 47 mL/min/{1.73_m2} — ABNORMAL LOW (ref >59)
Glucose: 110 mg/dL — ABNORMAL HIGH (ref 65–99)
Potassium: 4.3 mmol/L (ref 3.5–5.2)
Sodium: 141 mmol/L (ref 134–144)

## 2019-12-19 LAB — CBC
Hematocrit: 27.8 % — ABNORMAL LOW (ref 37.5–51.0)
Hemoglobin: 9 g/dL — ABNORMAL LOW (ref 13.0–17.7)
MCH: 21.7 pg — ABNORMAL LOW (ref 26.6–33.0)
MCHC: 32.4 g/dL (ref 31.5–35.7)
MCV: 67 fL — ABNORMAL LOW (ref 79–97)
Platelets: 210 10*3/uL (ref 150–450)
RBC: 4.15 x10E6/uL (ref 4.14–5.80)
RDW: 16.8 % — ABNORMAL HIGH (ref 11.6–15.4)
WBC: 8.1 10*3/uL (ref 3.4–10.8)

## 2019-12-19 NOTE — Patient Instructions (Signed)
Medication Instructions:   *If you need a refill on your cardiac medications before your next appointment, please call your pharmacy*   Lab Work:  If you have labs (blood work) drawn today and your tests are completely normal, you will receive your results only by: Marland Kitchen MyChart Message (if you have MyChart) OR . A paper copy in the mail If you have any lab test that is abnormal or we need to change your treatment, we will call you to review the results.   Testing/Procedures: Your physician has recommended that you have a Cardioversion (DCCV). Electrical Cardioversion uses a jolt of electricity to your heart either through paddles or wired patches attached to your chest. This is a controlled, usually prescheduled, procedure. Defibrillation is done under light anesthesia in the hospital, and you usually go home the day of the procedure. This is done to get your heart back into a normal rhythm. You are not awake for the procedure. Please see the instruction sheet given to you today.  ARRIVE AT [facility] AT 10 AM ON 12/25/19 NOTHING TO EAT AFTER MIDNIGHT  Follow-Up: At [facility], you and your health needs are our priority.  As part of our continuing mission to provide you with exceptional heart care, we have created designated Provider Care Teams.  These Care Teams include your primary Cardiologist (physician) and Advanced Practice Providers (APPs -  Physician Assistants and Nurse Practitioners) who all work together to provide you with the care you need, when you need it.  We recommend signing up for the patient portal called "MyChart".  Sign up information is provided on this After Visit Summary.  MyChart is used to connect with patients for Virtual Visits (Telemedicine).  Patients are able to view lab/test results, encounter notes, upcoming appointments, etc.  Non-urgent messages can be sent to your provider as well.   To learn more about what you can do with MyChart, go to  [email or web address].    Your next appointment:   6 month(s)  The format for your next appointment:   In Person  Provider:   [name], MD   Other Instructions COVIDTEST 12/22/19 AT 10:20AM

## 2019-12-19 NOTE — Progress Notes (Signed)
HPI [name] returns today for followup. He is a pleasant 85 yo man with a h/o HTN and atrial flutter who underwent EPS/RFA of typical atrial flutter but then developed LA flutter. He has been placed on amiodarone. He feel ok at rest but gets fatigued and sob with exertion. He does not have palpitations and has not missed any of his meds.  No Known Allergies   Current Outpatient Medications  Medication Sig Dispense Refill  . amiodarone (PACERONE) 200 MG tablet Take 1 tablet (200 mg total) by mouth daily. 90 tablet 3  . apixaban (ELIQUIS) 2.5 MG TABS tablet Take 1 tablet (2.5 mg total) by mouth 2 (two) times daily. 180 tablet 1  . finasteride (PROSCAR) 5 MG tablet Take 5 mg by mouth daily as needed (retention).     . folic acid (FOLVITE) 1 MG tablet Take 1 mg by mouth daily.    . metoprolol tartrate (LOPRESSOR) 25 MG tablet Take 0.5 tablets (12.5 mg total) by mouth 2 (two) times daily. 60 tablet 6  . Multiple Vitamin (MULTIVITAMIN WITH MINERALS) TABS tablet Take 1 tablet by mouth daily.    . pantoprazole (PROTONIX) 40 MG tablet Take 40 mg by mouth daily before breakfast.     . predniSONE (DELTASONE) 5 MG tablet Take 5 mg by mouth daily with breakfast.     . furosemide (LASIX) 40 MG tablet Take 1 tablet (40 mg total) by mouth daily. 60 tablet 0  . potassium chloride SA (KLOR-CON) 20 MEQ tablet Take 1 tablet (20 mEq total) by mouth daily. 60 tablet 0   No current facility-administered medications for this visit.     Past Medical History:  Diagnosis Date  . Achalasia 06/12/2019   Noted on EGD  . Aortic insufficiency 03/24/2019   AI of bioprosthetic AVR severe 4 plus  . Chronic diastolic CHF (congestive heart failure) ([place]) 03/24/2019  . Colon polyps    s/p diverticular perforation requiring 2-stage repair  . Derangement of right shoulder joint    need replacing has no use of  . Diverticulosis   . DJD (degenerative joint disease), lumbosacral   . Dysphagia    eats soft food   . Esophageal stricture   . GERD (gastroesophageal reflux disease)   . Hemolytic anemia ([place])   . History of blood transfusion given with 04-15-19 surgery  . History of blood transfusion 07/18/2019  . History of kidney stones    passed stones  . Hypertension   . Mitral regurgitation    moderate  . Occipital neuralgia   . Pancytopenia ([place])   . Postoperative atrial fibrillation ([place]) 05/10/2015  . Prosthetic valve dysfunction   . Rheumatoid arthritis ([place])    s/o long term steroids  shoulders and hands  . Rotator cuff arthropathy    right  . S/P aortic valve replacement with bioprosthetic valve 01/16/2008   19mm Edwards Magna perimount bovine pericardial tissue valve, model 3000  . S/P valve-in-valve TAVR 04/15/2019   26 mm Edwards Sapien 3 Ultra transcatheter heart valve placed via percutaneous right transfemoral approach   . SBE (subacute bacterial endocarditis) prophylaxis candidate    for dental procedures  . Schatzki's ring 06/12/2019   Narrowing, Noted on EGD  . Severe aortic stenosis    S/P prosthetic valve replacement w 25 mm Edwards like science percardial tissue valve,[place] - 01/2008  . Thrombocytopenia ([place])     ROS:   All systems reviewed and negative except as noted in the HPI.  Past Surgical History:  Procedure Laterality Date  . A-FLUTTER ABLATION N/A 10/10/2019   Procedure: A-FLUTTER ABLATION;  Surgeon: Evans Lance, MD;  Location: [place] [facility];  Service: Cardiovascular;  Laterality: N/A;  . APPENDECTOMY    . BALLOON DILATION N/A 06/12/2019   Procedure: BALLOON DILATION;  Surgeon: Ronnette Juniper, MD;  Location: [facility];  Service: Gastroenterology;  Laterality: N/A;  . BOTOX INJECTION N/A 01/22/2018   Procedure: BOTOX INJECTION;  Surgeon: Ronnette Juniper, MD;  Location: [facility];  Service: Gastroenterology;  Laterality: N/A;  . CARDIAC CATHETERIZATION     09  . CARDIAC VALVE REPLACEMENT  01/2008   aortic valve replacement  . CARDIOVERSION N/A  09/25/2019   Procedure: CARDIOVERSION;  Surgeon: Jerline Pain, MD;  Location[facility];  Service: Cardiovascular;  Laterality: N/A;  . CATARACT EXTRACTION W/ INTRAOCULAR LENS  IMPLANT, BILATERAL    . COLON RESECTION     diverticulitis   . CORONARY ANGIOPLASTY    . dental implants     permanent  . ESOPHAGEAL MANOMETRY N/A 11/07/2017   Procedure: ESOPHAGEAL MANOMETRY (EM);  Surgeon: Ronnette Juniper, MD;  Location: [facility];  Service: Gastroenterology;  Laterality: N/A;  . ESOPHAGOGASTRODUODENOSCOPY (EGD) WITH PROPOFOL N/A 01/22/2018   Procedure: ESOPHAGOGASTRODUODENOSCOPY (EGD) WITH PROPOFOL;  Surgeon: Ronnette Juniper, MD;  Location: [facility];  Service: Gastroenterology;  Laterality: N/A;  . ESOPHAGOGASTRODUODENOSCOPY (EGD) WITH PROPOFOL N/A 04/30/2019   Procedure: ESOPHAGOGASTRODUODENOSCOPY (EGD) WITH PROPOFOL;  Surgeon: Laurence Spates, MD;  Location: [place];  Service: Endoscopy;  Laterality: N/A;  . ESOPHAGOGASTRODUODENOSCOPY (EGD) WITH PROPOFOL N/A 06/12/2019   Procedure: ESOPHAGOGASTRODUODENOSCOPY (EGD) WITH PROPOFOL;  Surgeon: Ronnette Juniper, MD;  [facility];  Service: Gastroenterology;  Laterality: N/A;  with botox injection  . EXCISIONAL TOTAL SHOULDER ARTHROPLASTY WITH ANTIBIOTIC SPACER Right 12/31/2018   Procedure: EXCISIONAL TOTAL SHOULDER ARTHROPLASTY WITH ANTIBIOTIC SPACER;  Surgeon: Netta Cedars, MD;  Location: [place];  Service: Orthopedics;  Laterality: Right;  . HERNIA REPAIR    . INTRAOPERATIVE TRANSTHORACIC ECHOCARDIOGRAM N/A 04/15/2019   Procedure: Intraoperative Transthoracic Echocardiogram;  Surgeon: Sherren Mocha, MD;  Location: [place];  Service: Open Heart Surgery;  Laterality: N/A;  . IRRIGATION AND DEBRIDEMENT SHOULDER Right 11/20/2018    IRRIGATION AND DEBRIDEMENT SHOULDER WITH POLY EXCHANGE (Right Shoulder)  . IRRIGATION AND DEBRIDEMENT SHOULDER Right 11/20/2018   Procedure: IRRIGATION AND DEBRIDEMENT SHOULDER WITH POLY EXCHANGE;  Surgeon: Netta Cedars,  MD;  Location: [place];  Service: Orthopedics;  Laterality: Right;  . LUMBAR LAMINECTOMY     x 2  . REVERSE SHOULDER ARTHROPLASTY Right 03/01/2018   Procedure: RIGHT REVERSE SHOULDER ARTHROPLASTY;  Surgeon: Netta Cedars, MD;  Location: [place];  Service: Orthopedics;  Laterality: Right;  . REVERSE SHOULDER ARTHROPLASTY Right 07/18/2019   Procedure: REVERSE TOTAL SHOULDER ARTHROPLASTY and removal of antiobotic spacer;  Surgeon: Netta Cedars, MD;  Location: [facility];  Service: Orthopedics;  Laterality: Right;  interscalene block  . REVERSE SHOULDER ARTHROPLASTY Right 08/06/2019   Procedure: Reduction of dislocated reverse total shoulder and poly exchange;  Surgeon: Netta Cedars, MD;  Location: [facility];  Service: Orthopedics;  Laterality: Right;  need 1 hour  . RIGHT/LEFT HEART CATH AND CORONARY ANGIOGRAPHY N/A 04/02/2019   Procedure: RIGHT/LEFT HEART CATH AND CORONARY ANGIOGRAPHY;  Surgeon: Leonie Man, MD;  Location: [place] [facility];  Service: Cardiovascular;  Laterality: N/A;  . SAVORY DILATION N/A 01/22/2018   Procedure: SAVORY DILATION;  Surgeon: Ronnette Juniper, MD;  Location: [facility];  Service: Gastroenterology;  Laterality:  N/A;  Azzie Almas DILATION N/A 04/30/2019   Procedure: SAVORY DILATION;  Surgeon: Laurence Spates, MD;  Location: [place];  Service: Endoscopy;  Laterality: N/A;  With fluro  . SHOULDER HEMI-ARTHROPLASTY Right 06/14/2018   Procedure: RIGHT  REVERSE TOTAL SHOULDER OPEN POLY EXCHANGE;  Surgeon: Netta Cedars, MD;  Location: [place];  Service: Orthopedics;  Laterality: Right;  . SUBMUCOSAL INJECTION  06/12/2019   Procedure: SUBMUCOSAL INJECTION;  Surgeon: Ronnette Juniper, MD;  Location: [facility];  Service: Gastroenterology;;  . TEE WITHOUT CARDIOVERSION N/A 01/29/2014   Procedure: TRANSESOPHAGEAL ECHOCARDIOGRAM (TEE);  Surgeon: Sueanne Margarita, MD;  Location: [facility] ENDOSCOPY;  Service: Cardiovascular;  Laterality: N/A;  . TEE WITHOUT CARDIOVERSION N/A 04/02/2019   Procedure:  TRANSESOPHAGEAL ECHOCARDIOGRAM (TEE);  Surgeon: Josue Hector, MD;  Location: [facility] ENDOSCOPY;  Service: Cardiovascular;  Laterality: N/A;  . TEE WITHOUT CARDIOVERSION N/A 09/25/2019   Procedure: TRANSESOPHAGEAL ECHOCARDIOGRAM (TEE);  Surgeon: Jerline Pain, MD;  Location: [facility] ENDOSCOPY;  Service: Cardiovascular;  Laterality: N/A;  . TONSILLECTOMY    . TRANSCATHETER AORTIC VALVE REPLACEMENT, TRANSFEMORAL  04/15/2019  . TRANSCATHETER AORTIC VALVE REPLACEMENT, TRANSFEMORAL N/A 04/15/2019   Procedure: TRANSCATHETER AORTIC VALVE REPLACEMENT, TRANSFEMORAL with POST BALLOON DILATION;  Surgeon: Sherren Mocha, MD;  Location: [place];  Service: Open Heart Surgery;  Laterality: N/A;     Family History  Problem Relation Age of Onset  . Other Mother        NO HEALTH PROBLEMS  . Heart disease Father   . Arthritis Father   . Heart Problems Sister        RELATED TO A MVA  . Suicidality Brother   . Other Sister        Lanett  . Other Brother        2 [place]  . Other Daughter        2 [place]     Social History   Socioeconomic History  . Marital status: Widowed    Spouse name: Not on file  . Number of children: 2  . Years of education: Not on file  . Highest education level: Not on file  Occupational History  . Occupation: Retired Market researcher at [employer]  . Smoking status: Former Smoker    Packs/day: 0.50    Years: 10.00    Pack years: 5.00    Types: Cigarettes    Start date: 01/16/1974    Quit date: 09/05/1983    Years since quitting: 36.3  . Smokeless tobacco: Former Systems developer    Types: Chew  Substance and Sexual Activity  . Alcohol use: Not Currently    Alcohol/week: 0.0 standard drinks  . Drug use: No  . Sexual activity: Not on file  Other Topics Concern  . Not on file  Social History Narrative  . Not on file   Social Determinants of Health   Financial Resource Strain:   . Difficulty of Paying Living Expenses:   Food Insecurity:   .  Worried About Charity fundraiser in the Last Year:   . Arboriculturist in the Last Year:   Transportation Needs:   . Film/video editor (Medical):   Marland Kitchen Lack of Transportation (Non-Medical):   Physical Activity:   . Days of Exercise per Week:   . Minutes of Exercise per Session:   Stress:   . Feeling of Stress :   Social Connections:   . Frequency of Communication with  Friends and Family:   . Frequency of Social Gatherings with Friends and Family:   . Attends Religious Services:   . Active Member of Clubs or Organizations:   . Attends Archivist Meetings:   Marland Kitchen Marital Status:   Intimate Partner Violence:   . Fear of Current or Ex-Partner:   . Emotionally Abused:   Marland Kitchen Physically Abused:   . Sexually Abused:      BP 108/64   Pulse (!) 111   Ht 5' 2.5" (1.588 m)   Wt 110 lb (49.9 kg)   SpO2 93%   BMI 19.80 kg/m   Physical Exam:  Well appearing NAD HEENT: Unremarkable Neck:  No JVD, no thyromegally Lymphatics:  No adenopathy Back:  No CVA tenderness Lungs:  Clear with no wheezes HEART:  Regular rate rhythm, no murmurs, no rubs, no clicks Abd:  soft, positive bowel sounds, no organomegally, no rebound, no guarding Ext:  2 plus pulses, no edema, no cyanosis, no clubbing Skin:  No rashes no nodules Neuro:  CN II through XII intact, motor grossly intact  EKG - atypical atrial flutter    Assess/Plan: 1. Atypical atrial flutter/fib - I have recommended we proceed with DC CV on amiodarone. He denies missing any of his Eliquis.  Mikle Bosworth.D.

## 2019-12-22 ENCOUNTER — Other Ambulatory Visit (HOSPITAL_COMMUNITY)
Admission: RE | Admit: 2019-12-22 | Discharge: 2019-12-22 | Disposition: A | Discharge status: Home / Self Care | Payer: Medicare PPO | Source: Ambulatory Visit | Attending: Cardiology | Admitting: Cardiology

## 2019-12-22 DIAGNOSIS — Z01812 Encounter for preprocedural laboratory examination: Secondary | ICD-10-CM | POA: Insufficient documentation

## 2019-12-22 DIAGNOSIS — Z20822 Contact with and (suspected) exposure to covid-19: Secondary | ICD-10-CM | POA: Insufficient documentation

## 2019-12-22 LAB — SARS CORONAVIRUS 2 (TAT 6-24 HRS): SARS Coronavirus 2: NEGATIVE

## 2019-12-25 ENCOUNTER — Ambulatory Visit (HOSPITAL_COMMUNITY)
Admission: RE | Admit: 2019-12-25 | Discharge: 2019-12-25 | Disposition: A | Discharge status: Home / Self Care | Payer: Medicare PPO | Attending: Cardiology | Admitting: Cardiology

## 2019-12-25 ENCOUNTER — Encounter (HOSPITAL_COMMUNITY)
Admission: RE | Disposition: A | Discharge status: Home / Self Care | Payer: Medicare PPO | Source: Home / Self Care | Attending: Cardiology

## 2019-12-25 ENCOUNTER — Ambulatory Visit (HOSPITAL_COMMUNITY): Payer: Medicare PPO | Admitting: Certified Registered Nurse Anesthetist

## 2019-12-25 ENCOUNTER — Other Ambulatory Visit: Payer: Self-pay

## 2019-12-25 ENCOUNTER — Encounter (HOSPITAL_COMMUNITY): Payer: Self-pay | Admitting: Cardiology

## 2019-12-25 DIAGNOSIS — K219 Gastro-esophageal reflux disease without esophagitis: Secondary | ICD-10-CM | POA: Diagnosis not present

## 2019-12-25 DIAGNOSIS — Z7901 Long term (current) use of anticoagulants: Secondary | ICD-10-CM | POA: Diagnosis not present

## 2019-12-25 DIAGNOSIS — M069 Rheumatoid arthritis, unspecified: Secondary | ICD-10-CM | POA: Diagnosis not present

## 2019-12-25 DIAGNOSIS — I484 Atypical atrial flutter: Secondary | ICD-10-CM | POA: Insufficient documentation

## 2019-12-25 DIAGNOSIS — Z953 Presence of xenogenic heart valve: Secondary | ICD-10-CM | POA: Diagnosis not present

## 2019-12-25 DIAGNOSIS — Z8249 Family history of ischemic heart disease and other diseases of the circulatory system: Secondary | ICD-10-CM | POA: Diagnosis not present

## 2019-12-25 DIAGNOSIS — I110 Hypertensive heart disease with heart failure: Secondary | ICD-10-CM | POA: Insufficient documentation

## 2019-12-25 DIAGNOSIS — R1310 Dysphagia, unspecified: Secondary | ICD-10-CM | POA: Diagnosis not present

## 2019-12-25 DIAGNOSIS — I5033 Acute on chronic diastolic (congestive) heart failure: Secondary | ICD-10-CM | POA: Diagnosis not present

## 2019-12-25 DIAGNOSIS — Z7952 Long term (current) use of systemic steroids: Secondary | ICD-10-CM | POA: Diagnosis not present

## 2019-12-25 DIAGNOSIS — I5032 Chronic diastolic (congestive) heart failure: Secondary | ICD-10-CM | POA: Diagnosis not present

## 2019-12-25 DIAGNOSIS — Z87891 Personal history of nicotine dependence: Secondary | ICD-10-CM | POA: Insufficient documentation

## 2019-12-25 DIAGNOSIS — I340 Nonrheumatic mitral (valve) insufficiency: Secondary | ICD-10-CM | POA: Insufficient documentation

## 2019-12-25 DIAGNOSIS — Z79899 Other long term (current) drug therapy: Secondary | ICD-10-CM | POA: Diagnosis not present

## 2019-12-25 DIAGNOSIS — D696 Thrombocytopenia, unspecified: Secondary | ICD-10-CM | POA: Diagnosis not present

## 2019-12-25 HISTORY — PX: CARDIOVERSION: SHX1299

## 2019-12-25 SURGERY — CARDIOVERSION
Anesthesia: General

## 2019-12-25 MED ORDER — LIDOCAINE 2% (20 MG/ML) 5 ML SYRINGE
INTRAMUSCULAR | Status: DC | PRN
  Administered 2019-12-25: 60 mg via INTRAVENOUS

## 2019-12-25 MED ORDER — SODIUM CHLORIDE 0.9 % IV SOLN: INTRAVENOUS | Status: DC | PRN

## 2019-12-25 MED ORDER — PROPOFOL 10 MG/ML IV BOLUS
INTRAVENOUS | Status: DC | PRN
  Administered 2019-12-25: 40 mg via INTRAVENOUS

## 2019-12-25 NOTE — Anesthesia Procedure Notes (Signed)
Procedure Name: General with mask airway Date/Time: 12/25/2019 11:00 AM Performed by: Janene Harvey, CRNA Pre-anesthesia Checklist: Patient identified, Emergency Drugs available, Suction available and Patient being monitored Oxygen Delivery Method: Ambu bag Placement Confirmation: positive ETCO2 Dental Injury: Teeth and Oropharynx as per pre-operative assessment

## 2019-12-25 NOTE — Interval H&P Note (Signed)
History and Physical Interval Note:  12/25/2019 10:21 AM  Joshua Hudson  has presented today for surgery, with the diagnosis of AFIB.  The various methods of treatment have been discussed with the patient and family. After consideration of risks, benefits and other options for treatment, the patient has consented to  Procedure(s): CARDIOVERSION (N/A) as a surgical intervention.  The patient's history has been reviewed, patient examined, no change in status, stable for surgery.  I have reviewed the patient's chart and labs.  Questions were answered to the patient's satisfaction.     Kirk Ruths

## 2019-12-25 NOTE — Transfer of Care (Signed)
Immediate Anesthesia Transfer of Care Note  Patient: Joshua Hudson  Procedure(s) Performed: CARDIOVERSION (N/A )  Patient Location: Endoscopy Unit  Anesthesia Type:General  Level of Consciousness: drowsy  Airway & Oxygen Therapy: Patient Spontanous Breathing  Post-op Assessment: Report given to RN and Post -op Vital signs reviewed and stable  Post vital signs: Reviewed  Last Vitals:  Vitals Value Taken Time  BP    Temp    Pulse    Resp    SpO2      Last Pain:  Vitals:   12/25/19 1024  TempSrc: Temporal  PainSc: 0-No pain         Complications: No apparent anesthesia complications

## 2019-12-25 NOTE — Anesthesia Preprocedure Evaluation (Signed)
Anesthesia Evaluation  Patient identified by MRN, date of birth, ID band Patient awake    Reviewed: Allergy & Precautions, NPO status , Patient's Chart, lab work & pertinent test results, reviewed documented beta blocker date and time   Airway Mallampati: II  TM Distance: >3 FB Neck ROM: Full    Dental  (+) Teeth Intact, Dental Advisory Given, Caps   Pulmonary former smoker,    Pulmonary exam normal breath sounds clear to auscultation       Cardiovascular hypertension, Pt. on home beta blockers and Pt. on medications +CHF  Normal cardiovascular exam+ dysrhythmias Atrial Fibrillation + Valvular Problems/Murmurs (s/p AVR x2)  Rhythm:Regular Rate:Normal     Neuro/Psych negative neurological ROS     GI/Hepatic Neg liver ROS, GERD  Medicated and Controlled,  Endo/Other  negative endocrine ROS  Renal/GU negative Renal ROS     Musculoskeletal  (+) Arthritis , Rheumatoid disorders,    Abdominal   Peds  Hematology  (+) Blood dyscrasia (Eliquis), ,   Anesthesia Other Findings Day of surgery medications reviewed with the patient.  Reproductive/Obstetrics                             Anesthesia Physical Anesthesia Plan  ASA: III  Anesthesia Plan: General   Post-op Pain Management:    Induction: Intravenous  PONV Risk Score and Plan: 2 and Propofol infusion and Treatment may vary due to age or medical condition  Airway Management Planned: Mask  Additional Equipment:   Intra-op Plan:   Post-operative Plan:   Informed Consent: I have reviewed the patients History and Physical, chart, labs and discussed the procedure including the risks, benefits and alternatives for the proposed anesthesia with the patient or authorized representative who has indicated his/her understanding and acceptance.     Dental advisory given  Plan Discussed with: CRNA  Anesthesia Plan Comments:          Anesthesia Quick Evaluation

## 2019-12-25 NOTE — Procedures (Signed)
Electrical Cardioversion Procedure Note Joshua Hudson [id number] 1934/12/09  Procedure: Electrical Cardioversion Indications:  Atrial Flutter  Procedure Details Consent: Risks of procedure as well as the alternatives and risks of each were explained to the (patient/caregiver).  Consent for procedure obtained. Time Out: Verified patient identification, verified procedure, site/side was marked, verified correct patient position, special equipment/implants available, medications/allergies/relevent history reviewed, required imaging and test results available.  Performed  Patient placed on cardiac monitor, pulse oximetry, supplemental oxygen as necessary.  Sedation given: Pt sedated by anesthesia with lidocaine 60 mg and diprovan 40 mg IV. Pacer pads placed anterior and posterior chest.  Cardioverted 1 time(s).  Cardioverted at 120J.  Evaluation Findings: Post procedure EKG shows: Sinus bradycardia Complications: None Patient did tolerate procedure well.   Joshua Hudson 12/25/2019, 10:22 AM

## 2019-12-25 NOTE — Discharge Instructions (Signed)
Electrical Cardioversion Electrical cardioversion is the delivery of a jolt of electricity to restore a normal rhythm to the heart. A rhythm that is too fast or is not regular keeps the heart from pumping well. In this procedure, sticky patches or metal paddles are placed on the chest to deliver electricity to the heart from a device.  What can I expect after the procedure?  Your blood pressure, heart rate, breathing rate, and blood oxygen level will be monitored until you leave the hospital or clinic.  Your heart rhythm will be watched to make sure it does not change.  You may have some redness on the skin where the shocks were given.  Follow these instructions at home:  Do not drive for 24 hours if you were given a sedative during your procedure.  Take over-the-counter and prescription medicines only as told by your health care provider.  Ask your health care provider how to check your pulse. Check it often.  Rest for 48 hours after the procedure or as told by your health care provider.  Avoid or limit your caffeine use as told by your health care provider.  Keep all follow-up visits as told by your health care provider. This is important.  Contact a health care provider if:  You feel like your heart is beating too quickly or your pulse is not regular.  You have a serious muscle cramp that does not go away.  Get help right away if:  You have discomfort in your chest.  You are dizzy or you feel faint.  You have trouble breathing or you are short of breath.  Your speech is slurred.  You have trouble moving an arm or leg on one side of your body.  Your fingers or toes turn cold or blue.  Summary  Electrical cardioversion is the delivery of a jolt of electricity to restore a normal rhythm to the heart.  This procedure may be done right away in an emergency or may be a scheduled procedure if the condition is not an emergency.  Generally, this is a safe  procedure.  After the procedure, check your pulse often as told by your health care provider.  This information is not intended to replace advice given to you by your health care provider. Make sure you discuss any questions you have with your health care provider. Document Revised: 03/24/2019 Document Reviewed: 03/24/2019 Elsevier Patient Education  2020 Elsevier Inc.  

## 2019-12-25 NOTE — Anesthesia Postprocedure Evaluation (Signed)
Anesthesia Post Note  Patient: Joshua Hudson  Procedure(s) Performed: CARDIOVERSION (N/A )     Patient location during evaluation: PACU Anesthesia Type: General Level of consciousness: awake and alert Pain management: pain level controlled Vital Signs Assessment: post-procedure vital signs reviewed and stable Respiratory status: spontaneous breathing, nonlabored ventilation, respiratory function stable and patient connected to nasal cannula oxygen Cardiovascular status: blood pressure returned to baseline and stable Postop Assessment: no apparent nausea or vomiting Anesthetic complications: no    Last Vitals:  Vitals:   12/25/19 1120 12/25/19 1130  BP: 107/65 (!) 113/58  Pulse: (!) 57 (!) 57  Resp: 18 17  Temp:    SpO2: 100% 100%    Last Pain:  Vitals:   12/25/19 1130  TempSrc:   PainSc: 0-No pain                 Effie Berkshire

## 2019-12-25 NOTE — H&P (Signed)
Office Visit   Go to Cards  12/19/2019  [facility]     Photo of Evans Lance, MD       Evans Lance, MD   Cardiology        Atypical atrial flutter [facility]) +1 more   Dx        Referred by Lavone Orn, MD   Reason for Visit        Additional Documentation   Vitals:       BP 108/64       Pulse 111        Ht 5' 2.5" (1.588 m)       Wt 49.9 kg       SpO2 93%       BMI 19.80 kg/m       BSA 1.48 m    Flowsheets:        NEWS,       MEWS Score,       Anthropometrics     Encounter Info:        Billing Info,       History,       Allergies,       Detailed Report            All Notes      Progress Notes by Evans Lance, MD at 12/19/2019 8:00 AM   Author: Evans Lance, MD Author Type: Physician Filed: 12/19/2019  8:41 AM  Note Status: Signed Cosign: Cosign Not Required Encounter Date: 12/19/2019  Editor: Evans Lance, MD (Physician)              untitled image              HPI  Joshua Hudson returns today for followup. He is a pleasant 85 yo man with a h/o HTN and atrial flutter who underwent EPS/RFA of typical atrial flutter but then developed LA flutter. He has been placed on amiodarone. He feel ok at rest but gets fatigued and sob with exertion. He does not have palpitations and has not missed any of his meds.   No Known Allergies               Current Outpatient Medications    Medication   Sig   Dispense   Refill    .   amiodarone (PACERONE) 200 MG tablet   Take 1 tablet (200 mg total) by mouth daily.   90 tablet   3    .   apixaban (ELIQUIS) 2.5 MG TABS tablet   Take 1 tablet (2.5 mg total) by mouth 2 (two) times daily.   180 tablet   1    .   finasteride (PROSCAR) 5 MG tablet   Take 5 mg by mouth daily as needed (retention).             .   folic acid  (FOLVITE) 1 MG tablet   Take 1 mg by mouth daily.            .   metoprolol tartrate (LOPRESSOR) 25 MG tablet   Take 0.5 tablets (12.5 mg total) by mouth 2 (two) times daily.   60 tablet   6    .   Multiple Vitamin (MULTIVITAMIN WITH MINERALS) TABS tablet   Take 1 tablet by mouth daily.            .   pantoprazole (PROTONIX) 40 MG tablet   Take  40 mg by mouth daily before breakfast.             .   predniSONE (DELTASONE) 5 MG tablet   Take 5 mg by mouth daily with breakfast.             .   furosemide (LASIX) 40 MG tablet   Take 1 tablet (40 mg total) by mouth daily.   60 tablet   0    .   potassium chloride SA (KLOR-CON) 20 MEQ tablet   Take 1 tablet (20 mEq total) by mouth daily.   60 tablet   0        No current facility-administered medications for this visit.                  Past Medical History:    Diagnosis   Date    .   Achalasia   06/12/2019        Noted on EGD    .   Aortic insufficiency   03/24/2019        AI of bioprosthetic AVR severe 4 plus    .   Chronic diastolic CHF (congestive heart failure) ([place])   03/24/2019    .   Colon polyps            s/p diverticular perforation requiring 2-stage repair    .   Derangement of right shoulder joint            need replacing has no use of    .   Diverticulosis        .   DJD (degenerative joint disease), lumbosacral        .   Dysphagia            eats soft food    .   Esophageal stricture        .   GERD (gastroesophageal reflux disease)        .   Hemolytic anemia ([place])        .   History of blood transfusion   given with 04-15-19 surgery    .   History of blood transfusion   07/18/2019    .   History of kidney stones            passed stones    .   Hypertension        .   Mitral regurgitation            moderate    .    Occipital neuralgia        .   Pancytopenia ([place])        .   Postoperative atrial fibrillation ([place])   05/10/2015    .   Prosthetic valve dysfunction        .   Rheumatoid arthritis ([place])            s/o long term steroids  shoulders and hands    .   Rotator cuff arthropathy            right    .   S/P aortic valve replacement with bioprosthetic valve   01/16/2008        30mm Edwards Magna perimount bovine pericardial tissue valve, model 3000    .   S/P valve-in-valve TAVR   04/15/2019        26 mm Edwards Sapien 3 Ultra transcatheter heart valve placed via percutaneous right transfemoral approach     .  SBE (subacute bacterial endocarditis) prophylaxis candidate            for dental procedures    .   Schatzki's ring   06/12/2019        Narrowing, Noted on EGD    .   Severe aortic stenosis            S/P prosthetic valve replacement w 25 mm Edwards like science percardial tissue valve,[place] - 01/2008    .   Thrombocytopenia ([place])              ROS:      All systems reviewed and negative except as noted in the HPI.              Past Surgical History:    Procedure   Laterality   Date    .   A-FLUTTER ABLATION   N/A   10/10/2019        Procedure: A-FLUTTER ABLATION;  Surgeon: Evans Lance, MD;  Location: [place] [facility];  Service: Cardiovascular;  Laterality: N/A;    .   APPENDECTOMY            .   BALLOON DILATION   N/A   06/12/2019        Procedure: BALLOON DILATION;  Surgeon: Ronnette Juniper, MD;  Location: [facility];  Service: Gastroenterology;  Laterality: N/A;    .   BOTOX INJECTION   N/A   01/22/2018        Procedure: BOTOX INJECTION;  Surgeon: Ronnette Juniper, MD;  Location: [facility];  Service: Gastroenterology;  Laterality: N/A;    .   CARDIAC CATHETERIZATION                09    .   CARDIAC VALVE  REPLACEMENT       01/2008        aortic valve replacement    .   CARDIOVERSION   N/A   09/25/2019        Procedure: CARDIOVERSION;  Surgeon: Jerline Pain, MD;  Location: [facility];  Service: Cardiovascular;  Laterality: N/A;    .   CATARACT EXTRACTION W/ INTRAOCULAR LENS  IMPLANT, BILATERAL            .   COLON RESECTION                diverticulitis     .   CORONARY ANGIOPLASTY            .   dental implants                permanent    .   ESOPHAGEAL MANOMETRY   N/A   11/07/2017        Procedure: ESOPHAGEAL MANOMETRY (EM);  Surgeon: Ronnette Juniper, MD;  Location: [facility];  Service: Gastroenterology;  Laterality: N/A;    .   ESOPHAGOGASTRODUODENOSCOPY (EGD) WITH PROPOFOL   N/A   01/22/2018        Procedure: ESOPHAGOGASTRODUODENOSCOPY (EGD) WITH PROPOFOL;  Surgeon: Ronnette Juniper, MD;  Location: [facility];  Service: Gastroenterology;  Laterality: N/A;    .   ESOPHAGOGASTRODUODENOSCOPY (EGD) WITH PROPOFOL   N/A   04/30/2019        Procedure: ESOPHAGOGASTRODUODENOSCOPY (EGD) WITH PROPOFOL;  Surgeon: Laurence Spates, MD;  Location: [place];  Service: Endoscopy;  Laterality: N/A;    .   ESOPHAGOGASTRODUODENOSCOPY (EGD) WITH PROPOFOL   N/A   06/12/2019  Procedure: ESOPHAGOGASTRODUODENOSCOPY (EGD) WITH PROPOFOL;  Surgeon: Ronnette Juniper, MD;  Location: [facility];  Service: Gastroenterology;  Laterality: N/A;  with botox injection    .   EXCISIONAL TOTAL SHOULDER ARTHROPLASTY WITH ANTIBIOTIC SPACER   Right   12/31/2018        Procedure: EXCISIONAL TOTAL SHOULDER ARTHROPLASTY WITH ANTIBIOTIC SPACER;  Surgeon: Netta Cedars, MD;  Location: [place];  Service: Orthopedics;  Laterality: Right;    .   HERNIA REPAIR            .   INTRAOPERATIVE TRANSTHORACIC ECHOCARDIOGRAM   N/A   04/15/2019        Procedure: Intraoperative Transthoracic Echocardiogram;  Surgeon:  Sherren Mocha, MD;  Location: [place];  Service: Open Heart Surgery;  Laterality: N/A;    .   IRRIGATION AND DEBRIDEMENT SHOULDER   Right   11/20/2018         IRRIGATION AND DEBRIDEMENT SHOULDER WITH POLY EXCHANGE (Right Shoulder)    .   IRRIGATION AND DEBRIDEMENT SHOULDER   Right   11/20/2018        Procedure: IRRIGATION AND DEBRIDEMENT SHOULDER WITH POLY EXCHANGE;  Surgeon: Netta Cedars, MD;  Location: [place];  Service: Orthopedics;  Laterality: Right;    .   LUMBAR LAMINECTOMY                x 2    .   REVERSE SHOULDER ARTHROPLASTY   Right   03/01/2018        Procedure: RIGHT REVERSE SHOULDER ARTHROPLASTY;  Surgeon: Netta Cedars, MD;  Location: [place];  Service: Orthopedics;  Laterality: Right;    .   REVERSE SHOULDER ARTHROPLASTY   Right   07/18/2019        Procedure: REVERSE TOTAL SHOULDER ARTHROPLASTY and removal of antiobotic spacer;  Surgeon: Netta Cedars, MD;  Location: [facility];  Service: Orthopedics;  Laterality: Right;  interscalene block    .   REVERSE SHOULDER ARTHROPLASTY   Right   08/06/2019        Procedure: Reduction of dislocated reverse total shoulder and poly exchange;  Surgeon: Netta Cedars, MD;  Location: [facility];  Service: Orthopedics;  Laterality: Right;  need 1 hour    .   RIGHT/LEFT HEART CATH AND CORONARY ANGIOGRAPHY   N/A   04/02/2019        Procedure: RIGHT/LEFT HEART CATH AND CORONARY ANGIOGRAPHY;  Surgeon: Leonie Man, MD;  Location: [place] [facility];  Service: Cardiovascular;  Laterality: N/A;    .   SAVORY DILATION   N/A   01/22/2018        Procedure: SAVORY DILATION;  Surgeon: Ronnette Juniper, MD;  [facility];  Service: Gastroenterology;  Laterality: N/A;    .   SAVORY DILATION   N/A   04/30/2019        Procedure: SAVORY DILATION;  Surgeon: Laurence Spates, MD;  Location: [place];  Service: Endoscopy;  Laterality: N/A;  With fluro    .    SHOULDER HEMI-ARTHROPLASTY   Right   06/14/2018        Procedure: RIGHT  REVERSE TOTAL SHOULDER OPEN POLY EXCHANGE;  Surgeon: Netta Cedars, MD;  Location: [place];  Service: Orthopedics;  Laterality: Right;    .   SUBMUCOSAL INJECTION       06/12/2019        Procedure: SUBMUCOSAL INJECTION;  Surgeon: Ronnette Juniper, MD;  Location: [facility];  Service: Gastroenterology;;    .  TEE WITHOUT CARDIOVERSION   N/A   01/29/2014        Procedure: TRANSESOPHAGEAL ECHOCARDIOGRAM (TEE);  Surgeon: Sueanne Margarita, MD;  Location: [facility]COPY;  Service: Cardiovascular;  Laterality: N/A;    .   TEE WITHOUT CARDIOVERSION   N/A   04/02/2019        Procedure: TRANSESOPHAGEAL ECHOCARDIOGRAM (TEE);  Surgeon: Josue Hector, MD;  Location: [facility];  Service: Cardiovascular;  Laterality: N/A;    .   TEE WITHOUT CARDIOVERSION   N/A   09/25/2019        Procedure: TRANSESOPHAGEAL ECHOCARDIOGRAM (TEE);  Surgeon: Jerline Pain, MD;  Location: [facility] END[facility]COPY;  Service: Cardiovascular;  Laterality: N/A;    .   TONSILLECTOMY            .   TRANSCATHETER AORTIC VALVE REPLACEMENT, TRANSFEMORAL       04/15/2019    .   TRANSCATHETER AORTIC VALVE REPLACEMENT, TRANSFEMORAL   N/A   04/15/2019        Procedure: TRANSCATHETER AORTIC VALVE REPLACEMENT, TRANSFEMORAL with POST BALLOON DILATION;  Surgeon: Sherren Mocha, MD;  Location: [place];  Service: Open Heart Surgery;  Laterality: N/A;                   Family History    Problem   Relation   Age of Onset    .   Other   Mother                NO HEALTH PROBLEMS    .   Heart disease   Father        .   Arthritis   Father        .   Heart Problems   Sister                RELATED TO A MVA    .   Suicidality   Brother        .   Other   Sister                [place]    .   Other   Brother                 2 [place]    .   Other   Daughter                2 [place]              Social History             Socioeconomic History    .   Marital status:   Widowed            Spouse name:   Not on file    .   Number of children:   2    .   Years of education:   Not on file    .   Highest education level:   Not on file    Occupational History    .   Occupation:   Retired Market researcher at [employer]    .   Smoking status:   Former Smoker            Packs/day:   0.50            Years:   10.00  Pack years:   5.00            Types:   Cigarettes            Start date:   01/16/1974            Quit date:   09/05/1983            Years since quitting:   36.3    .   Smokeless tobacco:   Former Systems developer            Types:   Chew    Substance and Sexual Activity    .   Alcohol use:   Not Currently            Alcohol/week:   0.0 standard drinks    .   Drug use:   No    .   Sexual activity:   Not on file    Other Topics   Concern    .   Not on file    Social History Narrative    .   Not on file        Social Determinants of Health           Financial Resource Strain:     .   Difficulty of Paying Living Expenses:     Food Insecurity:     .   Worried About Charity fundraiser in the Last Year:     .   Arboriculturist in the Last Year:     Transportation Needs:     .   Film/video editor (Medical):     Marland Kitchen   Lack of Transportation (Non-Medical):     Physical Activity:     .   Days of Exercise per Week:     .   Minutes of Exercise per Session:     Stress:     .   Feeling of Stress :     Social Connections:     .   Frequency of Communication with Friends and Family:     .   Frequency of Social Gatherings with Friends and  Family:     .   Attends Religious Services:     .   Active Member of Clubs or Organizations:     .   Attends Archivist Meetings:     Marland Kitchen   Marital Status:     Intimate Partner Violence:     .   Fear of Current or Ex-Partner:     .   Emotionally Abused:     Marland Kitchen   Physically Abused:     .   Sexually Abused:              BP 108/64   Pulse (!) 111   Ht 5' 2.5" (1.588 m)   Wt 110 lb (49.9 kg)   SpO2 93%   BMI 19.80 kg/m      Physical Exam:     Well appearing NAD  HEENT: Unremarkable  Neck:  No JVD, no thyromegally  Lymphatics:  No adenopathy  Back:  No CVA tenderness  Lungs:  Clear with no wheezes  HEART:  Regular rate rhythm, no murmurs, no rubs, no clicks  Abd:  soft, positive bowel sounds, no organomegally, no rebound, no guarding  Ext:  2 plus pulses, no edema, no cyanosis, no clubbing  Skin:  No rashes no nodules  Neuro:  CN II through XII intact, motor  grossly intact     EKG - atypical atrial flutter        Assess/Plan:  1. Atypical atrial flutter/fib - I have recommended we proceed with DC CV on amiodarone. He denies missing any of his Eliquis.     Gregg Taylor,M.D.    For DCCV; compliant with ajpixaban; no changes Kirk Ruths

## 2019-12-29 DIAGNOSIS — H353211 Exudative age-related macular degeneration, right eye, with active choroidal neovascularization: Secondary | ICD-10-CM | POA: Diagnosis not present

## 2019-12-31 ENCOUNTER — Telehealth (INDEPENDENT_AMBULATORY_CARE_PROVIDER_SITE_OTHER): Payer: Medicare PPO | Admitting: Cardiology

## 2019-12-31 ENCOUNTER — Encounter: Payer: Self-pay | Admitting: Cardiology

## 2019-12-31 ENCOUNTER — Other Ambulatory Visit: Payer: Self-pay

## 2019-12-31 VITALS — Ht 62.0 in | Wt 106.0 lb

## 2019-12-31 DIAGNOSIS — I5032 Chronic diastolic (congestive) heart failure: Secondary | ICD-10-CM

## 2019-12-31 DIAGNOSIS — R600 Localized edema: Secondary | ICD-10-CM | POA: Diagnosis not present

## 2019-12-31 DIAGNOSIS — I351 Nonrheumatic aortic (valve) insufficiency: Secondary | ICD-10-CM | POA: Diagnosis not present

## 2019-12-31 DIAGNOSIS — I2510 Atherosclerotic heart disease of native coronary artery without angina pectoris: Secondary | ICD-10-CM | POA: Diagnosis not present

## 2019-12-31 DIAGNOSIS — R0602 Shortness of breath: Secondary | ICD-10-CM | POA: Diagnosis not present

## 2019-12-31 DIAGNOSIS — I4892 Unspecified atrial flutter: Secondary | ICD-10-CM

## 2019-12-31 DIAGNOSIS — I350 Nonrheumatic aortic (valve) stenosis: Secondary | ICD-10-CM | POA: Diagnosis not present

## 2019-12-31 NOTE — Patient Instructions (Signed)
Medication Instructions:  Your physician recommends that you continue on your current medications as directed. Please refer to the Current Medication list given to you today.  *If you need a refill on your cardiac medications before your next appointment, please call your pharmacy*  Follow-Up: At [facility], you and your health needs are our priority.  As part of our continuing mission to provide you with exceptional heart care, we have created designated Provider Care Teams.  These Care Teams include your primary Cardiologist (physician) and Advanced Practice Providers (APPs -  Physician Assistants and Nurse Practitioners) who all work together to provide you with the care you need, when you need it.   Your next appointment:   6 month(s)  The format for your next appointment:   In Person  Provider:   You may see [name], MD or one of the following Advanced Practice Providers on your designated Care Team:    Melina Copa, PA-C  Ermalinda Barrios, PA-C    Other Instructions EKG in two weeks to follow up after cardioversion.

## 2019-12-31 NOTE — Progress Notes (Signed)
Virtual Visit via Telephone Note   This visit type was conducted due to national recommendations for restrictions regarding the COVID-19 Pandemic (e.g. social distancing) in an effort to limit this patient's exposure and mitigate transmission in our community.  Due to his co-morbid illnesses, this patient is at least at moderate risk for complications without adequate follow up.  This format is felt to be most appropriate for this patient at this time.  The patient did not have access to video technology/had technical difficulties with video requiring transitioning to audio format only (telephone).  All issues noted in this document were discussed and addressed.  No physical exam could be performed with this format.  Please refer to the patient's chart for his  consent to telehealth for [facility].   Evaluation Performed:  Follow-up visit  This visit type was conducted due to national recommendations for restrictions regarding the COVID-19 Pandemic (e.g. social distancing).  This format is felt to be most appropriate for this patient at this time.  All issues noted in this document were discussed and addressed.  No physical exam was performed (except for noted visual exam findings with Video Visits).  Please refer to the patient's chart (MyChart message for video visits and phone note for telephone visits) for the patient's consent to telehealth for [facility].  Date:  12/31/2019   ID:  Joshua Hudson, Joshua Hudson Sep 07, 1934, MRN [id number]  Patient Location:  Home  Provider location:   [place]  PCP:  Lavone Orn, MD  Cardiologist:  [name], MD  Electrophysiologist:  None   Chief Complaint:  AS, afib, CAD  History of Present Illness:    Joshua Hudson is a 85 y.o. male who presents via audio/video conferencing for a telehealth visit today.    Heaton R Barhamis a 85 y.o.malewith a history of severe AS s/p AVR with a 47mm Edwards bioprosthetic valve in 2009 with post op  afib (withno recurrence), HTN, rheumatoid arthritis on MTX, leflunomide and prednisone,achalasia,mod MR,andchronicdiastolic CHF, hemolytic anemia,bioprosthetic valve failure with severe AIs/p valve-in-valve TAVR (04/15/19).  Patient underwent aortic valve replacement using a 25 mm Edwards magna stented bovine pericardial tissue valve by Dr. Servando Snare in 2009 for severe symptomatic aortic stenosis. His early postoperative recovery was notable for postoperative atrial fibrillation which has not recurred. He has done well from a cardiac standpoint but echo performed in January of this year revealed normal left ventricular systolic function and mild aortic insufficiency.he subsequently developed relatively acute onset of shortness of breath, chest discomfort, orthopnea, and lower extremity edema. Initially symptoms were primarily with physical exertion but symptoms  progressed fairly rapidly.  He was seen by Havery Moros in the office on 03/18/2019 for preoperative clearance for redo shoulder arthroplasty. He was noted to have significant lower extremity edema as well as other signs and symptoms of acute heart failure.Follow-up TTE on 03/18/2019 revealed normal left ventricular systolic function with at least moderate AI. Subsequent TEE on 04/02/2019 confirmed the presence of severe prosthetic valve dysfunction with severe AI. There was no vegetation on the aortic valve she suggest endocarditis. Cardiac catheterization on 04/02/2019 showed normal coronaries.He was also noted to have worsening anemia. Haptoglobin was low c/w hemolytic anemia.  He underwentsuccessfulvalve in valveTAVR with a49mm Edwards Sapien UltraTHV via the TF approach on 04/15/19. Post operative echoshowed EF 60-65%, normally functioning TAVR with mean gradient of 14 mmHg and no AI.He was transfused 2 U PRBCs for hemolytic anemia felt to be related to his severe AI. He also  developed post op thrombocytopeniawith  platelets down to 46K.He was discharged on POD1 on aspirin and plavix.  He was found to be in aflutter with RVR on 09/22/2019 when he presented for esophageal dilatation.  He had been having fatigue, DOE and LE edema.  2D ecoh showed moderately reduced LVF.  He was started on Eliquis and underwent successful TEE/DCCV to NSR.  2D echo showed EF 45-50% felt to be tachy mediated.  He was seen by EP and underwent aflutter ablation.  He was back in aflutter at [place] on 3/9 and started on Amio.  He underwent DCCV to NSR on 12/25/2019.  He is here today for followup and is doing well.  He denies any chest pain or pressure, SOB, DOE, PND, orthopnea, dizziness, palpitations or syncope. He occasionally has some mild LE edema. He is compliant with his meds and is tolerating meds with no SE.    The patient does not have symptoms concerning for COVID-19 infection (fever, chills, cough, or new shortness of breath).   Prior CV studies:   The following studies were reviewed today:  Hospital notes, TEE, DCCV notes, EP consult note  Past Medical History:  Diagnosis Date  . Achalasia 06/12/2019   Noted on EGD  . Aortic insufficiency 03/24/2019   AI of bioprosthetic AVR severe 4 plus  . Chronic diastolic CHF (congestive heart failure) ([place]) 03/24/2019  . Colon polyps    s/p diverticular perforation requiring 2-stage repair  . Derangement of right shoulder joint    need replacing has no use of  . Diverticulosis   . DJD (degenerative joint disease), lumbosacral   . Dysphagia    eats soft food  . Esophageal stricture   . GERD (gastroesophageal reflux disease)   . Hemolytic anemia ([place])   . History of blood transfusion given with 04-15-19 surgery  . History of blood transfusion 07/18/2019  . History of kidney stones    passed stones  . Hypertension   . Mitral regurgitation    moderate  . Occipital neuralgia   . Pancytopenia ([place])   . Postoperative atrial fibrillation ([place]) 05/10/2015  . Prosthetic valve  dysfunction   . Rheumatoid arthritis ([place])    s/o long term steroids  shoulders and hands  . Rotator cuff arthropathy    right  . S/P aortic valve replacement with bioprosthetic valve 01/16/2008   67mm Edwards Magna perimount bovine pericardial tissue valve, model 3000  . S/P valve-in-valve TAVR 04/15/2019   26 mm Edwards Sapien 3 Ultra transcatheter heart valve placed via percutaneous right transfemoral approach   . SBE (subacute bacterial endocarditis) prophylaxis candidate    for dental procedures  . Schatzki's ring 06/12/2019   Narrowing, Noted on EGD  . Severe aortic stenosis    S/P prosthetic valve replacement w 25 mm Edwards like science percardial tissue valve,Anastasios Melander - 01/2008  . Thrombocytopenia ([place])    Past Surgical History:  Procedure Laterality Date  . A-FLUTTER ABLATION N/A 10/10/2019   Procedure: A-FLUTTER ABLATION;  Surgeon: Evans Lance, MD;  Location: [place] [facility];  Service: Cardiovascular;  Laterality: N/A;  . APPENDECTOMY    . BALLOON DILATION N/A 06/12/2019   Procedure: BALLOON DILATION;  Surgeon: Ronnette Juniper, MD;  Location: [facility];  Service: Gastroenterology;  Laterality: N/A;  . BOTOX INJECTION N/A 01/22/2018   Procedure: BOTOX INJECTION;  Surgeon: Ronnette Juniper, MD;  Location: [facility];  Service: Gastroenterology;  Laterality: N/A;  . CARDIAC CATHETERIZATION     09  . CARDIAC  VALVE REPLACEMENT  01/2008   aortic valve replacement  . CARDIOVERSION N/A 09/25/2019   Procedure: CARDIOVERSION;  Surgeon: Jerline Pain, MD;  Location[facility] ENDOSCOPY;  Service: Cardiovascular;  Laterality: N/A;  . CARDIOVERSION N/A 12/25/2019   Procedure: CARDIOVERSION;  Surgeon: Lelon Perla, MD;  Location: [facility] ENDOSCOPY;  Service: Cardiovascular;  Laterality: N/A;  . CATARACT EXTRACTION W/ INTRAOCULAR LENS  IMPLANT, BILATERAL    . COLON RESECTION     diverticulitis   . CORONARY ANGIOPLASTY    . dental implants     permanent  . ESOPHAGEAL MANOMETRY N/A 11/07/2017    Procedure: ESOPHAGEAL MANOMETRY (EM);  Surgeon: Ronnette Juniper, MD;  Location: [facility];  Service: Gastroenterology;  Laterality: N/A;  . ESOPHAGOGASTRODUODENOSCOPY (EGD) WITH PROPOFOL N/A 01/22/2018   Procedure: ESOPHAGOGASTRODUODENOSCOPY (EGD) WITH PROPOFOL;  Surgeon: Ronnette Juniper, MD;  Location: [facility];  Service: Gastroenterology;  Laterality: N/A;  . ESOPHAGOGASTRODUODENOSCOPY (EGD) WITH PROPOFOL N/A 04/30/2019   Procedure: ESOPHAGOGASTRODUODENOSCOPY (EGD) WITH PROPOFOL;  Surgeon: Laurence Spates, MD;  Location: [place];  Service: Endoscopy;  Laterality: N/A;  . ESOPHAGOGASTRODUODENOSCOPY (EGD) WITH PROPOFOL N/A 06/12/2019   Procedure: ESOPHAGOGASTRODUODENOSCOPY (EGD) WITH PROPOFOL;  Surgeon: Ronnette Juniper, MD;  [facility];  Service: Gastroenterology;  Laterality: N/A;  with botox injection  . EXCISIONAL TOTAL SHOULDER ARTHROPLASTY WITH ANTIBIOTIC SPACER Right 12/31/2018   Procedure: EXCISIONAL TOTAL SHOULDER ARTHROPLASTY WITH ANTIBIOTIC SPACER;  Surgeon: Netta Cedars, MD;  Location: [place];  Service: Orthopedics;  Laterality: Right;  . HERNIA REPAIR    . INTRAOPERATIVE TRANSTHORACIC ECHOCARDIOGRAM N/A 04/15/2019   Procedure: Intraoperative Transthoracic Echocardiogram;  Surgeon: Sherren Mocha, MD;  Location: [place];  Service: Open Heart Surgery;  Laterality: N/A;  . IRRIGATION AND DEBRIDEMENT SHOULDER Right 11/20/2018    IRRIGATION AND DEBRIDEMENT SHOULDER WITH POLY EXCHANGE (Right Shoulder)  . IRRIGATION AND DEBRIDEMENT SHOULDER Right 11/20/2018   Procedure: IRRIGATION AND DEBRIDEMENT SHOULDER WITH POLY EXCHANGE;  Surgeon: Netta Cedars, MD;  Location: [place];  Service: Orthopedics;  Laterality: Right;  . LUMBAR LAMINECTOMY     x 2  . REVERSE SHOULDER ARTHROPLASTY Right 03/01/2018   Procedure: RIGHT REVERSE SHOULDER ARTHROPLASTY;  Surgeon: Netta Cedars, MD;  Location: [place];  Service: Orthopedics;  Laterality: Right;  . REVERSE SHOULDER ARTHROPLASTY Right 07/18/2019    Procedure: REVERSE TOTAL SHOULDER ARTHROPLASTY and removal of antiobotic spacer;  Surgeon: Netta Cedars, MD;  Location: [facility];  Service: Orthopedics;  Laterality: Right;  interscalene block  . REVERSE SHOULDER ARTHROPLASTY Right 08/06/2019   Procedure: Reduction of dislocated reverse total shoulder and poly exchange;  Surgeon: Netta Cedars, MD;  Location: [facility];  Service: Orthopedics;  Laterality: Right;  need 1 hour  . RIGHT/LEFT HEART CATH AND CORONARY ANGIOGRAPHY N/A 04/02/2019   Procedure: RIGHT/LEFT HEART CATH AND CORONARY ANGIOGRAPHY;  Surgeon: Leonie Man, MD;  Location: [place] [facility];  Service: Cardiovascular;  Laterality: N/A;  . SAVORY DILATION N/A 01/22/2018   Procedure: SAVORY DILATION;  Surgeon: Ronnette Juniper, MD;  Location: [facility];  Service: Gastroenterology;  Laterality: N/A;  . SAVORY DILATION N/A 04/30/2019   Procedure: SAVORY DILATION;  Surgeon: Laurence Spates, MD;  Location: [place];  Service: Endoscopy;  Laterality: N/A;  With fluro  . SHOULDER HEMI-ARTHROPLASTY Right 06/14/2018   Procedure: RIGHT  REVERSE TOTAL SHOULDER OPEN POLY EXCHANGE;  Surgeon: Netta Cedars, MD;  Location: [place];  Service: Orthopedics;  Laterality: Right;  . SUBMUCOSAL INJECTION  06/12/2019   Procedure: SUBMUCOSAL INJECTION;  Surgeon: Ronnette Juniper, MD;  Location:  [facility];  Service: Gastroenterology;;  . TEE WITHOUT CARDIOVERSION N/A 01/29/2014   Procedure: TRANSESOPHAGEAL ECHOCARDIOGRAM (TEE);  Surgeon: Sueanne Margarita, MD;  Location: [facility] ENDOSCOPY;  Service: Cardiovascular;  Laterality: N/A;  . TEE WITHOUT CARDIOVERSION N/A 04/02/2019   Procedure: TRANSESOPHAGEAL ECHOCARDIOGRAM (TEE);  Surgeon: Josue Hector, MD;  Location: [facility] ENDOSCOPY;  Service: Cardiovascular;  Laterality: N/A;  . TEE WITHOUT CARDIOVERSION N/A 09/25/2019   Procedure: TRANSESOPHAGEAL ECHOCARDIOGRAM (TEE);  Surgeon: Jerline Pain, MD;  Location: [facility];  Service: Cardiovascular;  Laterality: N/A;  .  TONSILLECTOMY    . TRANSCATHETER AORTIC VALVE REPLACEMENT, TRANSFEMORAL  04/15/2019  . TRANSCATHETER AORTIC VALVE REPLACEMENT, TRANSFEMORAL N/A 04/15/2019   Procedure: TRANSCATHETER AORTIC VALVE REPLACEMENT, TRANSFEMORAL with POST BALLOON DILATION;  Surgeon: Sherren Mocha, MD;  Location: [place];  Service: Open Heart Surgery;  Laterality: N/A;     Current Meds  Medication Sig  . amiodarone (PACERONE) 200 MG tablet Take 1 tablet (200 mg total) by mouth daily.  Marland Kitchen apixaban (ELIQUIS) 2.5 MG TABS tablet Take 1 tablet (2.5 mg total) by mouth 2 (two) times daily.  . finasteride (PROSCAR) 5 MG tablet Take 5 mg by mouth daily as needed (retention).   . folic acid (FOLVITE) 1 MG tablet Take 1 mg by mouth daily.  . furosemide (LASIX) 40 MG tablet Take 1 tablet (40 mg total) by mouth daily.  . metoprolol tartrate (LOPRESSOR) 25 MG tablet Take 0.5 tablets (12.5 mg total) by mouth 2 (two) times daily.  . Multiple Vitamin (MULTIVITAMIN WITH MINERALS) TABS tablet Take 1 tablet by mouth daily.  . pantoprazole (PROTONIX) 40 MG tablet Take 40 mg by mouth daily before breakfast.   . Polyethyl Glycol-Propyl Glycol (LUBRICANT EYE DROPS) 0.4-0.3 % SOLN Place 1 drop into both eyes 3 (three) times daily as needed (dry/irritated eyes.).  Marland Kitchen potassium chloride SA (KLOR-CON) 20 MEQ tablet Take 1 tablet (20 mEq total) by mouth daily.  . predniSONE (DELTASONE) 5 MG tablet Take 5 mg by mouth daily with breakfast.      Allergies:   Patient has no known allergies.   Social History   Tobacco Use  . Smoking status: Former Smoker    Packs/day: 0.50    Years: 10.00    Pack years: 5.00    Types: Cigarettes    Start date: 01/16/1974    Quit date: 09/05/1983    Years since quitting: 36.3  . Smokeless tobacco: Former Systems developer    Types: Chew  Substance Use Topics  . Alcohol use: Not Currently    Alcohol/week: 0.0 standard drinks  . Drug use: No     Family Hx: The patient's family history includes Arthritis in his father;  Heart Problems in his sister; Heart disease in his father; Other in his brother, daughter, mother, and sister; Suicidality in his brother.  ROS:   Please see the history of present illness.     All other systems reviewed and are negative.   Labs/Other Tests and Data Reviewed:    Recent Labs: 04/23/2019: NT-Pro BNP 1,582 09/22/2019: B Natriuretic Peptide 175.3; Magnesium 2.2; TSH 2.108 10/03/2019: ALT 18 12/19/2019: BUN 17; Creatinine, Ser 1.39; Hemoglobin 9.0; Platelets 210; Potassium 4.3; Sodium 141   Recent Lipid Panel Lab Results  Component Value Date/Time   CHOL  01/14/2008 04:50 AM    174        ATP III CLASSIFICATION:  <200     mg/dL   Desirable  200-239  mg/dL   Borderline High  >=240  mg/dL   High   TRIG 43 01/14/2008 04:50 AM   HDL 65 01/14/2008 04:50 AM   CHOLHDL 2.7 01/14/2008 04:50 AM   LDLCALC (H) 01/14/2008 04:50 AM    100        Total Cholesterol/HDL:CHD Risk Coronary Heart Disease Risk Table                     Men   Women  1/2 Average Risk   3.4   3.3    Wt Readings from Last 3 Encounters:  12/31/19 106 lb (48.1 kg)  12/25/19 108 lb 0.4 oz (49 kg)  12/19/19 110 lb (49.9 kg)     Objective:    Vital Signs:  Ht 5\' 2"  (1.575 m)   Wt 106 lb (48.1 kg)   BMI 19.39 kg/m     ASSESSMENT & PLAN:    1.  1. Aortic Insufficiency -severe bioprosthetic AI  -now s/p valve in valve TAVR in 04/2019 -TEE and echo showed normal functioning valve in valve TAVR and no periprosthetic leak  2. Severe AS -s/p remote AVR for severe AS in 2009 with bioprosthetic 60mm Edwards Science pericardial tissue AVR -see above - developed severe bioprosthetic AR and now s/p valve in valve TAVR -doing well, repeat echo 05/2019 showed normal LVF with stable TAVR with normal function with mean gradient 63mmHg and trivial periprosthetic AI. -TEE 09/2019 showed no periprosthetic leak -no ASA or Plavix due to DOAC  3. Non obstructive ASCAD -nonobstructive by cath 2009 with 20-30%  OM2 -nonobstructive by coronary CTA 2015 in all 3 vessels -he has chronic DOE due to diastolic CHF but this is stable and has not had any SOB recently -denies any anginal symptoms -cardiac cath at time of TAVR showed normal coronary arteries  4. SOB -this is chronic and suspect multifactorial from anemia, CHF, PAF and possible ischemia -normal functioning TAVR by TEE 09/2019 -nonischemic DCM noted 09/2019 due to aflutter with RVR -will repeat echo in 2 months to see if EF has improved after restoring NSR  5. LE edema -stable -encouraged him to follow a < 2gm Na diet -continue Lasix 40mg  daily -creatinine stable at 1.39 and K+ 4.3 12/19/2019  6. Chronic diastolic CHF -likely multifactorial due to anemia, dietary indiscretion from Na, atrial flutter and anemia -continue diuretics and BB  7.  Paroxysmal atrial flutter -s/p aflutter ablation -reverted back to aflutter and started on Amio -now s/p DCCV to NSR -continue Amio 200mg  dialy and Eliquis 2.5mg  BID (dosed for age > 58 and weight < 60kg) -followed by EP -I will have him come in for an EKG to make sure he is still in NSR in 2 weeks  COVID-19 Education: The signs and symptoms of COVID-19 were discussed with the patient and how to seek care for testing (follow up with PCP or arrange E-visit).  The importance of social distancing was discussed today.  Patient Risk:   After full review of this patient's clinical status, I feel that they are at least moderate risk at this time.  Time:   Today, I have spent 25 minutes on telemedicine discussing medical problems including AI, AS, aflutter, CHF, edema, CAD and reviewing patient's chart including hospital notes, 2D echoes, TEE, EP consult, DCCV notes.  Medication Adjustments/Labs and Tests Ordered: Current medicines are reviewed at length with the patient today.  Concerns regarding medicines are outlined above.  Tests Ordered: No orders of the defined types were placed in this  encounter.  Medication Changes: No orders of the defined types were placed in this encounter.   Disposition:  Follow up in 6 month(s)  Signed, [name], MD  12/31/2019 8:22 AM    [facility]

## 2020-01-12 ENCOUNTER — Telehealth: Payer: Self-pay

## 2020-01-12 ENCOUNTER — Other Ambulatory Visit: Payer: Self-pay

## 2020-01-12 ENCOUNTER — Ambulatory Visit (INDEPENDENT_AMBULATORY_CARE_PROVIDER_SITE_OTHER): Payer: Medicare PPO

## 2020-01-12 VITALS — HR 44 | Ht 62.0 in | Wt 112.0 lb

## 2020-01-12 DIAGNOSIS — R5383 Other fatigue: Secondary | ICD-10-CM | POA: Diagnosis not present

## 2020-01-12 DIAGNOSIS — D509 Iron deficiency anemia, unspecified: Secondary | ICD-10-CM | POA: Diagnosis not present

## 2020-01-12 DIAGNOSIS — Z6820 Body mass index (BMI) 20.0-20.9, adult: Secondary | ICD-10-CM | POA: Diagnosis not present

## 2020-01-12 DIAGNOSIS — I4892 Unspecified atrial flutter: Secondary | ICD-10-CM

## 2020-01-12 DIAGNOSIS — M0579 Rheumatoid arthritis with rheumatoid factor of multiple sites without organ or systems involvement: Secondary | ICD-10-CM | POA: Diagnosis not present

## 2020-01-12 DIAGNOSIS — F4321 Adjustment disorder with depressed mood: Secondary | ICD-10-CM | POA: Diagnosis not present

## 2020-01-12 DIAGNOSIS — M810 Age-related osteoporosis without current pathological fracture: Secondary | ICD-10-CM | POA: Diagnosis not present

## 2020-01-12 DIAGNOSIS — R1319 Other dysphagia: Secondary | ICD-10-CM | POA: Diagnosis not present

## 2020-01-12 DIAGNOSIS — D508 Other iron deficiency anemias: Secondary | ICD-10-CM | POA: Diagnosis not present

## 2020-01-12 DIAGNOSIS — M2550 Pain in unspecified joint: Secondary | ICD-10-CM | POA: Diagnosis not present

## 2020-01-12 NOTE — Patient Instructions (Signed)
Medication Instructions:  STOP METOPROLOL TARTRATE *If you need a refill on your cardiac medications before your next appointment, please call your pharmacy*   Lab Work: NONE If you have labs (blood work) drawn today and your tests are completely normal, you will receive your results only by: Marland Kitchen MyChart Message (if you have MyChart) OR . A paper copy in the mail If you have any lab test that is abnormal or we need to change your treatment, we will call you to review the results.   Testing/Procedures: NONE   Follow-Up: At [facility], you and your health needs are our priority.  As part of our continuing mission to provide you with exceptional heart care, we have created designated Provider Care Teams.  These Care Teams include your primary Cardiologist (physician) and Advanced Practice Providers (APPs -  Physician Assistants and Nurse Practitioners) who all work together to provide you with the care you need, when you need it.  We recommend signing up for the patient portal called "MyChart".  Sign up information is provided on this After Visit Summary.  MyChart is used to connect with patients for Virtual Visits (Telemedicine).  Patients are able to view lab/test results, encounter notes, upcoming appointments, etc.  Non-urgent messages can be sent to your provider as well.   To learn more about what you can do with MyChart, go to [email or web address].      Other Instructions

## 2020-01-12 NOTE — Telephone Encounter (Signed)
1.) Reason for visit: EKG  2.) Name of MD requesting visit: Dr Radford Pax  3.) H&P: see chart  4.) ROS related to problem: EKG post Cardioversion 4/22 and virtual visit with Dr Radford Pax 4/28  5.) Assessment and plan per MD: EKG showed Sinus Bradycardia HR 44;  Post Cardioversion: Sinus Bradycardia HR 56;  Per Dr Harrington Challenger, stopped Metoprolol Tartrate and keep f/u appt with Joseph Art in June.

## 2020-02-03 DIAGNOSIS — H353211 Exudative age-related macular degeneration, right eye, with active choroidal neovascularization: Secondary | ICD-10-CM | POA: Diagnosis not present

## 2020-02-08 NOTE — Progress Notes (Signed)
Cardiology Office Note Date:  02/09/2020  Patient ID:  Joshua Hudson 04-25-1935, MRN [id number] PCP:  Lavone Orn, MD  Cardiologist:  Dr. Radford Pax EP: Dr. Lovena Le    Chief Complaint:  6 week f/u  History of Present Illness: Joshua Hudson is a 85 y.o. male with history of HTN, VHD s/p AVR (bioprosthetic 2009 >>> TAVR 2020), RA,  Anchalasia, AFlutter s/p CTI ablation > developed LA flutter maintained in amiodarone, chronic CHF (systolic, NICM, suspect multifactorial (anemia, AFlutter, dietary indiscretions).  He comes in today to be seen for Dr. Lovena Le, last seen by him 12/19/19 feeling well at rest though easily winded planned for DCCV  This was successful 12/25/19. He had telehealth f/u with Dr. Radford Pax 12/31/19, , was doing well, planned to come in for an EKG to evaluate rhythm in a couple weeks, no changes were made. EKG 01/12/20 noted SB.  TODAY Unfortunately he feels no better post ablation then he did before.  He had no cardiac awareness of the AFib, no overt palpitations.  He says he never has had CP. In the last 3 months he feels like he has no stamina.  Get tired with minimal exertion.  He liks to tinker in his shop, this is an out building probably about 100 feet from the house, by the time he gets there he has to sit and regain his strength.   He is winded, but more so, feels weak. He reports his legs were swollen quite a bot though in the last 3-4 days have gtten much better (with no extra lasix use). No dizzy  Spells, no near syncope or syncope.  No bleeding or signs of bleeding   AAD hx Amiodarone started march 2021  Past Medical History:  Diagnosis Date  . Achalasia 06/12/2019   Noted on EGD  . Aortic insufficiency 03/24/2019   AI of bioprosthetic AVR severe 4 plus  . Chronic diastolic CHF (congestive heart failure) ([place]) 03/24/2019  . Colon polyps    s/p diverticular perforation requiring 2-stage repair  . Derangement of right shoulder joint    need replacing  has no use of  . Diverticulosis   . DJD (degenerative joint disease), lumbosacral   . Dysphagia    eats soft food  . Esophageal stricture   . GERD (gastroesophageal reflux disease)   . Hemolytic anemia ([place])   . History of blood transfusion given with 04-15-19 surgery  . History of blood transfusion 07/18/2019  . History of kidney stones    passed stones  . Hypertension   . Mitral regurgitation    moderate  . Occipital neuralgia   . Pancytopenia ([place])   . Postoperative atrial fibrillation ([place]) 05/10/2015  . Prosthetic valve dysfunction   . Rheumatoid arthritis ([place])    s/o long term steroids  shoulders and hands  . Rotator cuff arthropathy    right  . S/P aortic valve replacement with bioprosthetic valve 01/16/2008   16mm Edwards Magna perimount bovine pericardial tissue valve, model 3000  . S/P valve-in-valve TAVR 04/15/2019   26 mm Edwards Sapien 3 Ultra transcatheter heart valve placed via percutaneous right transfemoral approach   . SBE (subacute bacterial endocarditis) prophylaxis candidate    for dental procedures  . Schatzki's ring 06/12/2019   Narrowing, Noted on EGD  . Severe aortic stenosis    S/P prosthetic valve replacement w 25 mm Edwards like science percardial tissue valve,[place] - 01/2008  . Thrombocytopenia ([place])     Past Surgical History:  Procedure Laterality Date  . A-FLUTTER ABLATION N/A 10/10/2019   Procedure: A-FLUTTER ABLATION;  Surgeon: Evans Lance, MD;  Location: [place] [facility];  Service: Cardiovascular;  Laterality: N/A;  . APPENDECTOMY    . BALLOON DILATION N/A 06/12/2019   Procedure: BALLOON DILATION;  Surgeon: Ronnette Juniper, MD;  Location: [facility];  Service: Gastroenterology;  Laterality: N/A;  . BOTOX INJECTION N/A 01/22/2018   Procedure: BOTOX INJECTION;  Surgeon: Ronnette Juniper, MD;  Location: [facility];  Service: Gastroenterology;  Laterality: N/A;  . CARDIAC CATHETERIZATION     09  . CARDIAC VALVE REPLACEMENT  01/2008   aortic  valve replacement  . CARDIOVERSION N/A 09/25/2019   Procedure: CARDIOVERSION;  Surgeon: Jerline Pain, MD;  Location: [facility] END[facility]COPY;  Service: Cardiovascular;  Laterality: N/A;  . CARDIOVERSION N/A 12/25/2019   Procedure: CARDIOVERSION;  Surgeon: Lelon Perla, MD;  Location: [facility] ENDOSCOPY;  Service: Cardiovascular;  Laterality: N/A;  . CATARACT EXTRACTION W/ INTRAOCULAR LENS  IMPLANT, BILATERAL    . COLON RESECTION     diverticulitis   . CORONARY ANGIOPLASTY    . dental implants     permanent  . ESOPHAGEAL MANOMETRY N/A 11/07/2017   Procedure: ESOPHAGEAL MANOMETRY (EM);  Surgeon: Ronnette Juniper, MD;  Location: [facility];  Service: Gastroenterology;  Laterality: N/A;  . ESOPHAGOGASTRODUODENOSCOPY (EGD) WITH PROPOFOL N/A 01/22/2018   Procedure: ESOPHAGOGASTRODUODENOSCOPY (EGD) WITH PROPOFOL;  Surgeon: Ronnette Juniper, MD;  Location: [facility];  Service: Gastroenterology;  Laterality: N/A;  . ESOPHAGOGASTRODUODENOSCOPY (EGD) WITH PROPOFOL N/A 04/30/2019   Procedure: ESOPHAGOGASTRODUODENOSCOPY (EGD) WITH PROPOFOL;  Surgeon: Laurence Spates, MD;  Location: [place];  Service: Endoscopy;  Laterality: N/A;  . ESOPHAGOGASTRODUODENOSCOPY (EGD) WITH PROPOFOL N/A 06/12/2019   Procedure: ESOPHAGOGASTRODUODENOSCOPY (EGD) WITH PROPOFOL;  Surgeon: Ronnette Juniper, MD;  Location: [facility];  Service: Gastroenterology;  Laterality: N/A;  with botox injection  . EXCISIONAL TOTAL SHOULDER ARTHROPLASTY WITH ANTIBIOTIC SPACER Right 12/31/2018   Procedure: EXCISIONAL TOTAL SHOULDER ARTHROPLASTY WITH ANTIBIOTIC SPACER;  Surgeon: Netta Cedars, MD;  Location: [place];  Service: Orthopedics;  Laterality: Right;  . HERNIA REPAIR    . INTRAOPERATIVE TRANSTHORACIC ECHOCARDIOGRAM N/A 04/15/2019   Procedure: Intraoperative Transthoracic Echocardiogram;  Surgeon: Sherren Mocha, MD;  Location: [place];  Service: Open Heart Surgery;  Laterality: N/A;  . IRRIGATION AND DEBRIDEMENT SHOULDER Right 11/20/2018    IRRIGATION AND  DEBRIDEMENT SHOULDER WITH POLY EXCHANGE (Right Shoulder)  . IRRIGATION AND DEBRIDEMENT SHOULDER Right 11/20/2018   Procedure: IRRIGATION AND DEBRIDEMENT SHOULDER WITH POLY EXCHANGE;  Surgeon: Netta Cedars, MD;  Location: [place];  Service: Orthopedics;  Laterality: Right;  . LUMBAR LAMINECTOMY     x 2  . REVERSE SHOULDER ARTHROPLASTY Right 03/01/2018   Procedure: RIGHT REVERSE SHOULDER ARTHROPLASTY;  Surgeon: Netta Cedars, MD;  Location: [place];  Service: Orthopedics;  Laterality: Right;  . REVERSE SHOULDER ARTHROPLASTY Right 07/18/2019   Procedure: REVERSE TOTAL SHOULDER ARTHROPLASTY and removal of antiobotic spacer;  Surgeon: Netta Cedars, MD;  Location: [facility];  Service: Orthopedics;  Laterality: Right;  interscalene block  . REVERSE SHOULDER ARTHROPLASTY Right 08/06/2019   Procedure: Reduction of dislocated reverse total shoulder and poly exchange;  Surgeon: Netta Cedars, MD;  Location: [facility];  Service: Orthopedics;  Laterality: Right;  need 1 hour  . RIGHT/LEFT HEART CATH AND CORONARY ANGIOGRAPHY N/A 04/02/2019   Procedure: RIGHT/LEFT HEART CATH AND CORONARY ANGIOGRAPHY;  Surgeon: Leonie Man, MD;  Location: [place] [facility];  Service: Cardiovascular;  Laterality: N/A;  . SAVORY DILATION  N/A 01/22/2018   Procedure: SAVORY DILATION;  Surgeon: Ronnette Juniper, MD;  Location: [facility];  Service: Gastroenterology;  Laterality: N/A;  . SAVORY DILATION N/A 04/30/2019   Procedure: SAVORY DILATION;  Surgeon: Laurence Spates, MD;  Location: [place];  Service: Endoscopy;  Laterality: N/A;  With fluro  . SHOULDER HEMI-ARTHROPLASTY Right 06/14/2018   Procedure: RIGHT  REVERSE TOTAL SHOULDER OPEN POLY EXCHANGE;  Surgeon: Netta Cedars, MD;  Location: [place];  Service: Orthopedics;  Laterality: Right;  . SUBMUCOSAL INJECTION  06/12/2019   Procedure: SUBMUCOSAL INJECTION;  Surgeon: Ronnette Juniper, MD;  Location: [facility];  Service: Gastroenterology;;  . TEE WITHOUT CARDIOVERSION N/A 01/29/2014    Procedure: TRANSESOPHAGEAL ECHOCARDIOGRAM (TEE);  Surgeon: Sueanne Margarita, MD;  Location: [facility] ENDOSCOPY;  Service: Cardiovascular;  Laterality: N/A;  . TEE WITHOUT CARDIOVERSION N/A 04/02/2019   Procedure: TRANSESOPHAGEAL ECHOCARDIOGRAM (TEE);  Surgeon: Josue Hector, MD;  Location: [facility] ENDOSCOPY;  Service: Cardiovascular;  Laterality: N/A;  . TEE WITHOUT CARDIOVERSION N/A 09/25/2019   Procedure: TRANSESOPHAGEAL ECHOCARDIOGRAM (TEE);  Surgeon: Jerline Pain, MD;  Location: [facility] ENDOSCOPY;  Service: Cardiovascular;  Laterality: N/A;  . TONSILLECTOMY    . TRANSCATHETER AORTIC VALVE REPLACEMENT, TRANSFEMORAL  04/15/2019  . TRANSCATHETER AORTIC VALVE REPLACEMENT, TRANSFEMORAL N/A 04/15/2019   Procedure: TRANSCATHETER AORTIC VALVE REPLACEMENT, TRANSFEMORAL with POST BALLOON DILATION;  Surgeon: Sherren Mocha, MD;  Location: [place];  Service: Open Heart Surgery;  Laterality: N/A;    Current Outpatient Medications  Medication Sig Dispense Refill  . amiodarone (PACERONE) 200 MG tablet Take 1 tablet (200 mg total) by mouth daily. 90 tablet 3  . apixaban (ELIQUIS) 2.5 MG TABS tablet Take 1 tablet (2.5 mg total) by mouth 2 (two) times daily. 180 tablet 1  . finasteride (PROSCAR) 5 MG tablet Take 5 mg by mouth daily as needed (retention).     . folic acid (FOLVITE) 1 MG tablet Take 1 mg by mouth daily.    . furosemide (LASIX) 40 MG tablet Take 1 tablet (40 mg total) by mouth daily. 60 tablet 0  . leflunomide (ARAVA) 20 MG tablet Take 20 mg by mouth daily.    . Multiple Vitamin (MULTIVITAMIN WITH MINERALS) TABS tablet Take 1 tablet by mouth daily.    . pantoprazole (PROTONIX) 40 MG tablet Take 40 mg by mouth daily before breakfast.     . Polyethyl Glycol-Propyl Glycol (LUBRICANT EYE DROPS) 0.4-0.3 % SOLN Place 1 drop into both eyes 3 (three) times daily as needed (dry/irritated eyes.).    Marland Kitchen potassium chloride SA (KLOR-CON) 20 MEQ tablet Take 1 tablet (20 mEq total) by mouth daily. 60 tablet 0  . predniSONE  (DELTASONE) 5 MG tablet Take 5 mg by mouth daily with breakfast.      No current facility-administered medications for this visit.    Allergies:   Patient has no known allergies.   Social History:  The patient  reports that he quit smoking about 36 years ago. His smoking use included cigarettes. He started smoking about 46 years ago. He has a 5.00 pack-year smoking history. He has quit using smokeless tobacco.  His smokeless tobacco use included chew. He reports previous alcohol use. He reports that he does not use drugs.   Family History:  The patient's family history includes Arthritis in his father; Heart Problems in his sister; Heart disease in his father; Other in his brother, daughter, mother, and sister; Suicidality in his brother.  ROS:  Please see the history of present illness.  All other systems are reviewed and otherwise negative.   PHYSICAL EXAM:  VS:  BP 114/62   Pulse (!) 56   Ht 5\' 2"  (1.575 m)   Wt 109 lb (49.4 kg)   BMI 19.94 kg/m  BMI: Body mass index is 19.94 kg/m. Well nourished, well developed, in no acute distress  HEENT: normocephalic, atraumatic  Neck: no JVD, carotid bruits or masses Cardiac: RRR; 2/6SM, no rubs, or gallops Lungs:  CTA b/l, no wheezing, rhonchi or rales  Abd: soft, nontender MS: no deformity, he is quite thin, age appropriate atrophy Ext: only trace post ankle edema is noted  Skin: warm and dry, no rash Neuro:  No gross deficits appreciated Psych: euthymic mood, full affect   EKG:  SR, no St/T changes   09/25/2019: TTE IMPRESSIONS  1. Left ventricular ejection fraction, by visual estimation, is 40 to  45%. The left ventricle has moderately decreased function. There is no  left ventricular hypertrophy.  2. The left ventricle demonstrates global hypokinesis.  3. Global right ventricle has normal systolic function.The right  ventricular size is normal. No increase in right ventricular wall  thickness.  4. Left atrial size was  normal.  5. No LAA thrombus.  6. Right atrial size was normal.  7. The mitral valve is normal in structure. Mild mitral valve  regurgitation. No evidence of mitral stenosis.  8. The tricuspid valve is normal in structure.  9. The tricuspid valve is normal in structure. Tricuspid valve  regurgitation is moderate.  10. Aortic valve regurgitation is not visualized. No evidence of aortic  valve sclerosis or stenosis.  11. Valve in valve procedure 03/2019. No perivalvular leak.   There is a crescentric space in the 9 to 1 o'clock position (along  intraatrial septum-what was the non-coronary cusp) 0.6cm in width, 2.7 in  approximate length that does not have any flow (Color Dopper negative).  This space was present on pre-op TEE  during valve in valve procedure but does not appear to be as prominently  visualized in post op transthroacic echocardiograms. Discussed with  referring cardiologist.  12. The pulmonic valve was normal in structure. Pulmonic valve  regurgitation is not visualized.  13. Normal pulmonary artery systolic pressure.  14. The inferior vena cava is normal in size with greater than 50%  respiratory variability, suggesting right atrial pressure of 3 mmHg.  15. Did not attempt transgastric views secondary to stricure.     04/02/2019: LHC  Angiographically minimal CAD  The left ventricular systolic function is normal -as assessed by aortic root angiogram  There is severe (4+) aortic regurgitation.  LV end diastolic pressure is moderately elevated.   SUMMARY  Angiographically normal coronary arteries with a right dominant system.  Severe aortic insufficiency seen on aortic root angiogram  Mild to moderate elevated LVEDP and PCWP  RECOMMENDATIONS  We will place the patient in extended recovery/observation status to allow for monitoring of hemoglobin post procedure as well as to obtain consultations from the cardiac structural team and potentially  hematology to assess his ongoing anemia.  Anticipate that he should be able to home tomorrow, but would consider checking Cardiac CTA for pre-TAVR prior to discharge.  Otherwise continue home meds.   09/22/2019: TTE LVEF 45-50% 05/14/2019: TTE  LVEF 55-60% 04/16/2019; TTE, LVEF 60-65%   Recent Labs: 04/23/2019: NT-Pro BNP 1,582 09/22/2019: B Natriuretic Peptide 175.3; Magnesium 2.2; TSH 2.108 10/03/2019: ALT 18 12/19/2019: BUN 17; Creatinine, Ser 1.39; Hemoglobin 9.0; Platelets 210; Potassium 4.3; Sodium  141  No results found for requested labs within last 8760 hours.   CrCl cannot be calculated (Patient's most recent lab result is older than the maximum 21 days allowed.).   Wt Readings from Last 3 Encounters:  02/09/20 109 lb (49.4 kg)  01/12/20 112 lb (50.8 kg)  12/31/19 106 lb (48.1 kg)     Other studies reviewed: Additional studies/records reviewed today include: summarized above  ASSESSMENT AND PLAN:  1. AFlutter, (atypical LA)     CHA2DS2Vasc is 4, on Eliquis,  appropriately dosed for age and weight     Is in [place] today s/p DCCV     Amiodarone started March 2021, will get labs today      He feels no different in SR then he did when he saw Dr. Lovena Le   2. VHD     S/p AVR 2009 >>> TAVR 2020     Last echo noted above     C/w Dr. Radford Pax, and structural heart team   3. Chronic CHF (systolic) 4. NICM      His LVEF is declining.  His last TEE was done while in AF however. He is not on BB/ARB His exam today does not suggest overt volume OL, though he reports 3-4 days ago he did have LE swelling.   5. HTN     No changes,looks good   Poor exertional capacity He reports this as new for sure worse in the last 3-4 months.  He last saw Oncology Jan 2021, they mention Beta thalassemia minor.This is the cause of microcytosis. It is not iron deficiency related his anemia, thrombocytopenia Possible differential: Myelodysplastic syndrome versus methotrexate related toxicity.  (methotrexate was on hold, >> on ARAVA now, he sees them next month His Hgb not far from where it has been. At 9.0  LVEF is down some Recommend that we repeat his echo in SR.  He is due to see structural heart APP in August.  I will send her a copy of his note and see if she wants to see him sooner and any echo specifics she needs to order.   Disposition: F/u with EP otherwise in 79mo, sooner if needed.   Current medicines are reviewed at length with the patient today.  The patient did not have any concerns regarding medicines.  Venetia Night, PA-C 02/09/2020 1:57 PM     [address] [place] [address] [phone] (office)  [phone] (fax)

## 2020-02-09 ENCOUNTER — Ambulatory Visit: Payer: Medicare PPO | Admitting: Physician Assistant

## 2020-02-09 ENCOUNTER — Other Ambulatory Visit: Payer: Self-pay

## 2020-02-09 VITALS — BP 114/62 | HR 56 | Ht 62.0 in | Wt 109.0 lb

## 2020-02-09 DIAGNOSIS — Z952 Presence of prosthetic heart valve: Secondary | ICD-10-CM | POA: Diagnosis not present

## 2020-02-09 DIAGNOSIS — R0600 Dyspnea, unspecified: Secondary | ICD-10-CM

## 2020-02-09 DIAGNOSIS — I10 Essential (primary) hypertension: Secondary | ICD-10-CM

## 2020-02-09 DIAGNOSIS — Z79899 Other long term (current) drug therapy: Secondary | ICD-10-CM

## 2020-02-09 DIAGNOSIS — I5022 Chronic systolic (congestive) heart failure: Secondary | ICD-10-CM

## 2020-02-09 DIAGNOSIS — I484 Atypical atrial flutter: Secondary | ICD-10-CM | POA: Diagnosis not present

## 2020-02-09 NOTE — Patient Instructions (Addendum)
Medication Instructions:   Your physician recommends that you continue on your current medications as directed. Please refer to the Current Medication list given to you today.   *If you need a refill on your cardiac medications before your next appointment, please call your pharmacy*   Lab Work:   LFT AND TSH TODAY   If you have labs (blood work) drawn today and your tests are completely normal, you will receive your results only by:  [place] (if you have MyChart) OR  A paper copy in the mail If you have any lab test that is abnormal or we need to change your treatment, we will call you to review the results.   Testing/Procedures: Your physician has requested that you have an echocardiogram. Echocardiography is a painless test that uses sound waves to create images of your heart. It provides your doctor with information about the size and shape of your heart and how well your hearts chambers and valves are working. This procedure takes approximately one hour. There are no restrictions for this procedure.   Follow-Up: At [facility], you and your health needs are our priority.  As part of our continuing mission to provide you with exceptional heart care, we have created designated Provider Care Teams.  These Care Teams include your primary Cardiologist (physician) and Advanced Practice Providers (APPs -  Physician Assistants and Nurse Practitioners) who all work together to provide you with the care you need, when you need it.  We recommend signing up for the patient portal called "MyChart".  Sign up information is provided on this After Visit Summary.  MyChart is used to connect with patients for Virtual Visits (Telemedicine).  Patients are able to view lab/test results, encounter notes, upcoming appointments, etc.  Non-urgent messages can be sent to your provider as well.   To learn more about what you can do with MyChart, go to [email or web address].    Your next  appointment:   2 month(s)  The format for your next appointment:   In Person  Provider:   You may see [name], MD or one of the following Advanced Practice Providers on your designated Care Team:    Melina Copa, PA-C  Ermalinda Barrios, PA-C   Your physician wants you to follow-up in:  IN 6 MONTHS WITH DR TAYLOR/URSUY.  You will receive a reminder letter in the mail two months in advance. If you don't receive a letter, please call our office to schedule the follow-up appointment.   Other Instructions

## 2020-02-10 LAB — HEPATIC FUNCTION PANEL
ALT: 13 IU/L (ref 0–44)
AST: 24 IU/L (ref 0–40)
Albumin: 4.4 g/dL (ref 3.6–4.6)
Alkaline Phosphatase: 49 IU/L (ref 48–121)
Bilirubin Total: 0.4 mg/dL (ref 0.0–1.2)
Bilirubin, Direct: 0.15 mg/dL (ref 0.00–0.40)
Total Protein: 6.4 g/dL (ref 6.0–8.5)

## 2020-02-10 LAB — TSH: TSH: 4.99 u[IU]/mL — ABNORMAL HIGH (ref 0.450–4.500)

## 2020-02-25 ENCOUNTER — Ambulatory Visit (HOSPITAL_COMMUNITY): Payer: Medicare PPO | Attending: Internal Medicine

## 2020-02-25 ENCOUNTER — Other Ambulatory Visit: Payer: Self-pay

## 2020-02-25 DIAGNOSIS — R0600 Dyspnea, unspecified: Secondary | ICD-10-CM | POA: Insufficient documentation

## 2020-02-27 ENCOUNTER — Telehealth: Payer: Self-pay

## 2020-02-27 NOTE — Telephone Encounter (Signed)
-----   Message from Shirley Friar, PA-C sent at 02/26/2020  9:33 AM EDT ----- Please let the patient know his EF slightly improved compared to TEE. His ejection fraction is now just sub-normal at 45-50% that he is in NSR.   He has upcoming structural heart team visit with echo scheduled, not sure he needs repeat Echo again, but will defer to them. Will forward to structural heart team.

## 2020-02-27 NOTE — Telephone Encounter (Signed)
Per DPR spoke with Patty (POA) patients daughter who is aware and agreeable to echo results. She was very pleased at the improvement of her dad's EF. She is agreeable to keeping upcoming OV with Bonney Leitz on 04/14/20 at 2:00 pm and is also agreeable to cancelling upcoming echo appt.

## 2020-03-11 DIAGNOSIS — G453 Amaurosis fugax: Secondary | ICD-10-CM | POA: Diagnosis not present

## 2020-03-11 DIAGNOSIS — H353122 Nonexudative age-related macular degeneration, left eye, intermediate dry stage: Secondary | ICD-10-CM | POA: Diagnosis not present

## 2020-03-11 DIAGNOSIS — H35372 Puckering of macula, left eye: Secondary | ICD-10-CM | POA: Diagnosis not present

## 2020-03-11 DIAGNOSIS — H35363 Drusen (degenerative) of macula, bilateral: Secondary | ICD-10-CM | POA: Diagnosis not present

## 2020-03-11 DIAGNOSIS — H353211 Exudative age-related macular degeneration, right eye, with active choroidal neovascularization: Secondary | ICD-10-CM | POA: Diagnosis not present

## 2020-03-11 DIAGNOSIS — H53131 Sudden visual loss, right eye: Secondary | ICD-10-CM | POA: Diagnosis not present

## 2020-03-11 DIAGNOSIS — Z961 Presence of intraocular lens: Secondary | ICD-10-CM | POA: Diagnosis not present

## 2020-03-15 ENCOUNTER — Other Ambulatory Visit: Payer: Self-pay | Admitting: Physician Assistant

## 2020-03-16 ENCOUNTER — Ambulatory Visit
Admission: RE | Admit: 2020-03-16 | Discharge: 2020-03-16 | Disposition: A | Discharge status: Home / Self Care | Payer: Medicare PPO | Source: Ambulatory Visit | Attending: Physician Assistant | Admitting: Physician Assistant

## 2020-03-16 ENCOUNTER — Other Ambulatory Visit: Payer: Self-pay | Admitting: Physician Assistant

## 2020-03-16 ENCOUNTER — Other Ambulatory Visit: Payer: Self-pay

## 2020-03-16 DIAGNOSIS — R269 Unspecified abnormalities of gait and mobility: Secondary | ICD-10-CM

## 2020-03-16 DIAGNOSIS — R293 Abnormal posture: Secondary | ICD-10-CM

## 2020-03-16 DIAGNOSIS — H538 Other visual disturbances: Secondary | ICD-10-CM | POA: Diagnosis not present

## 2020-03-16 DIAGNOSIS — H5702 Anisocoria: Secondary | ICD-10-CM | POA: Diagnosis not present

## 2020-03-16 DIAGNOSIS — I480 Paroxysmal atrial fibrillation: Secondary | ICD-10-CM

## 2020-03-16 DIAGNOSIS — R29898 Other symptoms and signs involving the musculoskeletal system: Secondary | ICD-10-CM | POA: Diagnosis not present

## 2020-03-16 DIAGNOSIS — I739 Peripheral vascular disease, unspecified: Secondary | ICD-10-CM | POA: Diagnosis not present

## 2020-03-16 DIAGNOSIS — G319 Degenerative disease of nervous system, unspecified: Secondary | ICD-10-CM | POA: Diagnosis not present

## 2020-03-16 DIAGNOSIS — I708 Atherosclerosis of other arteries: Secondary | ICD-10-CM | POA: Diagnosis not present

## 2020-03-17 DIAGNOSIS — D561 Beta thalassemia: Secondary | ICD-10-CM | POA: Diagnosis not present

## 2020-03-17 DIAGNOSIS — M810 Age-related osteoporosis without current pathological fracture: Secondary | ICD-10-CM | POA: Diagnosis not present

## 2020-03-17 DIAGNOSIS — Z681 Body mass index (BMI) 19 or less, adult: Secondary | ICD-10-CM | POA: Diagnosis not present

## 2020-03-17 DIAGNOSIS — M2550 Pain in unspecified joint: Secondary | ICD-10-CM | POA: Diagnosis not present

## 2020-03-17 DIAGNOSIS — Z79899 Other long term (current) drug therapy: Secondary | ICD-10-CM | POA: Diagnosis not present

## 2020-03-17 DIAGNOSIS — G459 Transient cerebral ischemic attack, unspecified: Secondary | ICD-10-CM | POA: Diagnosis not present

## 2020-03-17 DIAGNOSIS — M0579 Rheumatoid arthritis with rheumatoid factor of multiple sites without organ or systems involvement: Secondary | ICD-10-CM | POA: Diagnosis not present

## 2020-03-22 ENCOUNTER — Other Ambulatory Visit: Payer: Self-pay | Admitting: Neurology

## 2020-03-22 NOTE — Progress Notes (Addendum)
[facility]    Provider:  Dr [name] Requesting Provider: Thayer Ohm Primary Care Provider:  Lavone Orn, MD  CC:  Right eye vision loss  HPI:  Joshua Hudson is a 85 y.o. male here as requested by Thayer Ohm for evaluation of stroke.  I reviewed Olivia Clelland's notes: Patient has past medical history of aortic stenosis status post prosthetic valve replacement, rheumatoid arthritis on long-term steroid use, occipital neuralgia, beta thalassemia,, degenerative joint disease, esophageal stricture, bronchiectasis, congestive heart failure class I, unequal pupil diameter, BPH, paroxysmal A. fib, abnormal gait, anemia, erectile dysfunction, severe malnutrition and pancytopenia.  Patient is on apixaban twice daily 2.5 mg.  Patient was last seen in the office and stroke was suspected, Dr. Deforest Hoyles was made aware, EKG showed sinus bradycardia, stat CT of the head was ordered and was negative for acute etiologies, we also discussed increasing Eliquis pending CT scan results, however Eliquis dosing was determined appropriate for age and weight, he underwent recent cardioversion in the hospital, presented with acute onset of vision loss in the right eye, he was evaluated by ophthalmology and cardiology, he was recommended to stop metoprolol due to bradycardia, he has been feeling fatigued, tired for several weeks, he was sitting and watching TV when his right eye suddenly lost vision, totally black in the right eye, no other symptoms at that time including slurred speech, lasted for about 5 minutes and resolve spontaneously, since that time he had many episodes of feeling lightheaded like he was drunk, very dizzy and 2 falls due to dizziness, did not hit his head with either fall, appears to happen randomly, he has chronic hypotension blood pressures at home around 87/53 and heart rate has been persistently low.  Patient with daughter states 2 weeks ago he lost complete vision in  the right eye, stayed like that for about 5 minutes but then it started to clear up, since then he is had continuous dizziness which is associated with nausea, blood pressure is running a lot lower than normal and the heart rate also going as low as 30s, cardiology is aware. He lost vision complete;y in his right eye, just went black, feeling dizzy right beforehand, lasted 5 minutes, then started clearing up, it is still blurry, he has stopped metoprolol but blood pressure is not better.  The right side doesn't feel right, feels different the face, arm and leg. Hasn't missed any eliquis. He is having headaches, on the right side of the head, he has had headaches the last few weeks. Happened the 28th of June and since then no worsening, he has had an echocardiogram, no neck pain, no jaw pain, no pain on chewing, no energy(not new). Daughter provides much information.  Reviewed notes, labs and imaging from outside physicians, which showed:  Personally reviewed CT of the head imaging and results: Showed mild diffuse atrophy small vessel disease (chronic microvascular ischemic changes), no demonstrable acute infarct, nothing acute.  Review of Systems: Patient complains of symptoms per HPI as well as the following symptoms vision loss, "stroke". Pertinent negatives and positives per HPI. All others negative.   Social History   Socioeconomic History   Marital status: Widowed    Spouse name: Not on file   Number of children: 2   Years of education: Not on file   Highest education level: Not on file  Occupational History   Occupation: Retired Market researcher at [employer]   Smoking status: Former Smoker    Packs/day: 0.50  Years: 10.00    Pack years: 5.00    Types: Cigarettes    Start date: 01/16/1974    Quit date: 09/05/1983    Years since quitting: 36.5   Smokeless tobacco: Former Systems developer    Types: Nurse, children's Use: Never used  Substance and Sexual Activity   Alcohol use:  Not Currently    Alcohol/week: 0.0 standard drinks   Drug use: No   Sexual activity: Not on file  Other Topics Concern   Not on file  Social History Narrative   Not on file   Social Determinants of Health   Financial Resource Strain:    Difficulty of Paying Living Expenses:   Food Insecurity:    Worried About Charity fundraiser in the Last Year:    Arboriculturist in the Last Year:   Transportation Needs:    Film/video editor (Medical):    Lack of Transportation (Non-Medical):   Physical Activity:    Days of Exercise per Week:    Minutes of Exercise per Session:   Stress:    Feeling of Stress :   Social Connections:    Frequency of Communication with Friends and Family:    Frequency of Social Gatherings with Friends and Family:    Attends Religious Services:    Active Member of Clubs or Organizations:    Attends Music therapist:    Marital Status:   Intimate Production manager Violence:    Fear of Current or Ex-Partner:    Emotionally Abused:    Physically Abused:    Sexually Abused:     Family History  Problem Relation Age of Onset   Other Mother        NO HEALTH PROBLEMS   Heart disease Father    Arthritis Father    Heart Problems Sister        RELATED TO A MVA   Suicidality Brother    Other Sister        3 SISTERS IN GOOD HEALTH   Other Brother        2 Wister   Other Daughter        2 IN GOOD HEALTH    Past Medical History:  Diagnosis Date   Achalasia 06/12/2019   Noted on EGD   Aortic insufficiency 03/24/2019   AI of bioprosthetic AVR severe 4 plus   Chronic diastolic CHF (congestive heart failure) ([place]) 03/24/2019   Colon polyps    s/p diverticular perforation requiring 2-stage repair   Derangement of right shoulder joint    need replacing has no use of   Diverticulosis    DJD (degenerative joint disease), lumbosacral    Dysphagia    eats soft food   Esophageal stricture     GERD (gastroesophageal reflux disease)    Hemolytic anemia ([place])    History of blood transfusion given with 04-15-19 surgery   History of blood transfusion 07/18/2019   History of kidney stones    passed stones   Hypertension    Mitral regurgitation    moderate   Occipital neuralgia    Pancytopenia (HCC)    Postoperative atrial fibrillation ([place]) 05/10/2015   Prosthetic valve dysfunction    Rheumatoid arthritis (HCC)    s/o long term steroids  shoulders and hands   Rotator cuff arthropathy    right   S/P aortic valve replacement with bioprosthetic valve 01/16/2008   60m Edwards Magna perimount bovine pericardial tissue  valve, model 3000   S/P valve-in-valve TAVR 04/15/2019   26 mm Edwards Sapien 3 Ultra transcatheter heart valve placed via percutaneous right transfemoral approach    SBE (subacute bacterial endocarditis) prophylaxis candidate    for dental procedures   Schatzki's ring 06/12/2019   Narrowing, Noted on EGD   Severe aortic stenosis    S/P prosthetic valve replacement w 25 mm Edwards like science percardial tissue valve,[place] - 01/2008   Thrombocytopenia [facility])     Patient Active Problem List   Diagnosis Date Noted   Monocular vision loss 03/23/2020   New onset atrial flutter ([place]) 63/33/5456   Acute systolic (congestive) heart failure (HCC) 09/22/2019   Elevated troponin 09/22/2019   Microcytic hypochromic anemia 09/22/2019   S/P shoulder replacement, right 07/18/2019   Thrombocytopenia ([place]) 05/20/2019   Other pancytopenia ([place]) 05/06/2019   Protein-calorie malnutrition, severe 04/30/2019   FTT (failure to thrive) in adult 04/28/2019   GERD (gastroesophageal reflux disease)    Esophageal stricture    History of esophageal stricture    Odynophagia    Hypertension    Rheumatoid arthritis ([place])    S/P valve-in-valve TAVR    Prosthetic valve dysfunction    Severe aortic regurgitation 04/02/2019   Mitral regurgitation  05/16/2016   Postoperative atrial fibrillation ([place]) 05/10/2015   Hemolytic anemia ([place]) 02/25/2014   Acute on chronic diastolic heart failure ([place]) 02/25/2014   Dysphagia 02/25/2014   S/P aortic valve replacement with bioprosthetic valve 01/16/2008    Past Surgical History:  Procedure Laterality Date   A-FLUTTER ABLATION N/A 10/10/2019   Procedure: A-FLUTTER ABLATION;  Surgeon: Evans Lance, MD;  Location: [place] [facility];  Service: Cardiovascular;  Laterality: N/A;   APPENDECTOMY     BALLOON DILATION N/A 06/12/2019   Procedure: BALLOON DILATION;  Surgeon: Ronnette Juniper, MD;  Location: [facility];  Service: Gastroenterology;  Laterality: N/A;   BOTOX INJECTION N/A 01/22/2018   Procedure: BOTOX INJECTION;  Surgeon: Ronnette Juniper, MD;  [facility]: [facility];  Service: Gastroenterology;  Laterality: N/A;   CARDIAC CATHETERIZATION     09   CARDIAC VALVE REPLACEMENT  01/2008   aortic valve replacement   CARDIOVERSION N/A 09/25/2019   Procedure: CARDIOVERSION;  Surgeon: Jerline Pain, MD;  Location: [place];  Service: Cardiovascular;  Laterality: N/A;   CARDIOVERSION N/A 12/25/2019   Procedure: CARDIOVERSION;  Surgeon: Lelon Perla, MD;  [facility];  Service: Cardiovascular;  Laterality: N/A;   CATARACT EXTRACTION W/ INTRAOCULAR LENS  IMPLANT, BILATERAL     COLON RESECTION     diverticulitis    CORONARY ANGIOPLASTY     dental implants     permanent   ESOPHAGEAL MANOMETRY N/A 11/07/2017   Procedure: ESOPHAGEAL MANOMETRY (EM);  Surgeon: Ronnette Juniper, MD;  [facility];  Service: Gastroenterology;  Laterality: N/A;   ESOPHAGOGASTRODUODENOSCOPY (EGD) WITH PROPOFOL N/A 01/22/2018   Procedure: ESOPHAGOGASTRODUODENOSCOPY (EGD) WITH PROPOFOL;  Surgeon: Ronnette Juniper, MD;  [facility];  Service: Gastroenterology;  Laterality: N/A;   ESOPHAGOGASTRODUODENOSCOPY (EGD) WITH PROPOFOL N/A 04/30/2019   Procedure: ESOPHAGOGASTRODUODENOSCOPY (EGD)  WITH PROPOFOL;  Surgeon: Laurence Spates, MD;  Location: [place];  Service: Endoscopy;  Laterality: N/A;   ESOPHAGOGASTRODUODENOSCOPY (EGD) WITH PROPOFOL N/A 06/12/2019   Procedure: ESOPHAGOGASTRODUODENOSCOPY (EGD) WITH PROPOFOL;  Surgeon: Ronnette Juniper, MD;  [facility];  Service: Gastroenterology;  Laterality: N/A;  with botox injection   EXCISIONAL TOTAL SHOULDER ARTHROPLASTY WITH ANTIBIOTIC SPACER Right 12/31/2018   Procedure: EXCISIONAL TOTAL SHOULDER ARTHROPLASTY WITH ANTIBIOTIC SPACER;  Surgeon: Netta Cedars, MD;  Location: [place];  Service: Orthopedics;  Laterality: Right;   HERNIA REPAIR     INTRAOPERATIVE TRANSTHORACIC ECHOCARDIOGRAM N/A 04/15/2019   Procedure: Intraoperative Transthoracic Echocardiogram;  Surgeon: Sherren Mocha, MD;  Location: [place];  Service: Open Heart Surgery;  Laterality: N/A;   IRRIGATION AND DEBRIDEMENT SHOULDER Right 11/20/2018    IRRIGATION AND DEBRIDEMENT SHOULDER WITH POLY EXCHANGE (Right Shoulder)   IRRIGATION AND DEBRIDEMENT SHOULDER Right 11/20/2018   Procedure: IRRIGATION AND DEBRIDEMENT SHOULDER WITH POLY EXCHANGE;  Surgeon: Netta Cedars, MD;  Location: [place];  Service: Orthopedics;  Laterality: Right;   LUMBAR LAMINECTOMY     x 2   REVERSE SHOULDER ARTHROPLASTY Right 03/01/2018   Procedure: RIGHT REVERSE SHOULDER ARTHROPLASTY;  Surgeon: Netta Cedars, MD;  Location: [place];  Service: Orthopedics;  Laterality: Right;   REVERSE SHOULDER ARTHROPLASTY Right 07/18/2019   Procedure: REVERSE TOTAL SHOULDER ARTHROPLASTY and removal of antiobotic spacer;  Surgeon: Netta Cedars, MD;  [facility];  Service: Orthopedics;  Laterality: Right;  interscalene block   REVERSE SHOULDER ARTHROPLASTY Right 08/06/2019   Procedure: Reduction of dislocated reverse total shoulder and poly exchange;  Surgeon: Netta Cedars, MD;  [facility];  Service: Orthopedics;  Laterality: Right;  need 1 hour   RIGHT/LEFT HEART CATH AND CORONARY ANGIOGRAPHY N/A  04/02/2019   Procedure: RIGHT/LEFT HEART CATH AND CORONARY ANGIOGRAPHY;  Surgeon: Leonie Man, MD;  Location: [place] [facility];  Service: Cardiovascular;  Laterality: N/A;   SAVORY DILATION N/A 01/22/2018   Procedure: SAVORY DILATION;  Surgeon: Ronnette Juniper, MD;  [facility];  Service: Gastroenterology;  Laterality: N/A;   SAVORY DILATION N/A 04/30/2019   Procedure: SAVORY DILATION;  Surgeon: Laurence Spates, MD;  Location: [place];  Service: Endoscopy;  Laterality: N/A;  With fluro   SHOULDER HEMI-ARTHROPLASTY Right 06/14/2018   Procedure: RIGHT  REVERSE TOTAL SHOULDER OPEN POLY EXCHANGE;  Surgeon: Netta Cedars, MD;  Location: [place];  Service: Orthopedics;  Laterality: Right;   SUBMUCOSAL INJECTION  06/12/2019   Procedure: SUBMUCOSAL INJECTION;  Surgeon: Ronnette Juniper, MD;  [facility];  Service: Gastroenterology;;   TEE WITHOUT CARDIOVERSION N/A 01/29/2014   Procedure: TRANSESOPHAGEAL ECHOCARDIOGRAM (TEE);  Surgeon: Sueanne Margarita, MD;  [facility];  Service: Cardiovascular;  Laterality: N/A;   TEE WITHOUT CARDIOVERSION N/A 04/02/2019   Procedure: TRANSESOPHAGEAL ECHOCARDIOGRAM (TEE);  Surgeon: Josue Hector, MD;  Location: [facility] ENDOSCOPY;  Service: Cardiovascular;  Laterality: N/A;   TEE WITHOUT CARDIOVERSION N/A 09/25/2019   Procedure: TRANSESOPHAGEAL ECHOCARDIOGRAM (TEE);  Surgeon: Jerline Pain, MD;  Location: [facility] ENDOSCOPY;  Service: Cardiovascular;  Laterality: N/A;   TONSILLECTOMY     TRANSCATHETER AORTIC VALVE REPLACEMENT, TRANSFEMORAL  04/15/2019   TRANSCATHETER AORTIC VALVE REPLACEMENT, TRANSFEMORAL N/A 04/15/2019   Procedure: TRANSCATHETER AORTIC VALVE REPLACEMENT, TRANSFEMORAL with POST BALLOON DILATION;  Surgeon: Sherren Mocha, MD;  Location: [place];  Service: Open Heart Surgery;  Laterality: N/A;    Current Outpatient Medications  Medication Sig Dispense Refill   amiodarone (PACERONE) 200 MG tablet Take 1 tablet (200 mg total) by  mouth daily. 90 tablet 3   finasteride (PROSCAR) 5 MG tablet Take 5 mg by mouth daily as needed (retention).      folic acid (FOLVITE) 1 MG tablet Take 1 mg by mouth daily.     furosemide (LASIX) 40 MG tablet Take 1 tablet (40 mg total) by mouth daily. 60 tablet 0   leflunomide (ARAVA) 20 MG tablet Take 20 mg by  mouth daily.     Multiple Vitamin (MULTIVITAMIN WITH MINERALS) TABS tablet Take 1 tablet by mouth daily.     pantoprazole (PROTONIX) 40 MG tablet Take 40 mg by mouth daily before breakfast.      Polyethyl Glycol-Propyl Glycol (LUBRICANT EYE DROPS) 0.4-0.3 % SOLN Place 1 drop into both eyes 3 (three) times daily as needed (dry/irritated eyes.).     potassium chloride SA (KLOR-CON) 20 MEQ tablet Take 1 tablet (20 mEq total) by mouth daily. 60 tablet 0   predniSONE (DELTASONE) 5 MG tablet Take 5 mg by mouth daily with breakfast.      apixaban (ELIQUIS) 2.5 MG TABS tablet Take 1 tablet (2.5 mg total) by mouth 2 (two) times daily. 180 tablet 1   No current facility-administered medications for this visit.    Allergies as of 03/23/2020   (No Known Allergies)    Vitals: BP (!) 146/71    Pulse (!) 56    Ht '5\' 1"'  (1.549 m)    Wt 105 lb (47.6 kg)    BMI 19.84 kg/m  Last Weight:  Wt Readings from Last 1 Encounters:  03/23/20 105 lb (47.6 kg)   Last Height:   Ht Readings from Last 1 Encounters:  03/23/20 '5\' 1"'  (1.549 m)     Physical exam: Exam: Gen: NAD, conversant, frail and thin,              CV: RRR, +SEM. + right > Left Carotid Bruits. No peripheral edema, warm, nontender. Right temporal  Area non tender to palpation and equal temporal pulses without bounding.  Eyes: Conjunctivae clear without exudates or hemorrhage  Neuro: Detailed Neurologic Exam  Speech:    Speech is normal; fluent and spontaneous with normal comprehension.  Cognition:    The patient is oriented to person, place, and time;     recent and remote memory intact;     language fluent;      normal attention, concentration,     fund of knowledge Cranial Nerves:    The pupils are equal, round, and reactive to light. Could not visualize fundi. Visual fields are full to finger confrontation. Extraocular movements are intact. Trigeminal sensation is decreased right side of the face but muscles of mastication are normal. The face is symmetric. The palate elevates in the midline. Hearing intact. Voice is normal. Shoulder shrug is normal. The tongue has normal motion without fasciculations.   Coordination:    Normal finger to nose   Gait:    Can get up and ambulate independently but imbalanced and risk of fall, declines using walking aid  Motor Observation:    No asymmetry, no atrophy, left hand course action > resting tremor  Tone:    Normal muscle tone.    Posture:   erect    Strength:    Strength is 4-4+/5 in the upper and lower limbs, symmetrical, generalized weakness     Sensation: decreased right arm and right leg     Reflex Exam:  DTR's:    Deep tendon reflexes in the lower extremities are absent, 1+ biceps bilaterally.   Toes:    The toes are downgoing bilaterally.   Clonus:    Clonus is absent.    Assessment/Plan:   85 y.o. male here as requested by Thayer Ohm for evaluation of stroke.  I reviewed Olivia Clelland's notes: Patient has past medical history of aortic stenosis status post prosthetic valve replacement, rheumatoid arthritis on long-term steroid use, occipital neuralgia, beta thalassemia,, degenerative joint  disease, esophageal stricture, bronchiectasis, congestive heart failure class I, unequal pupil diameter, BPH, paroxysmal A. fib, abnormal gait, anemia, erectile dysfunction, severe malnutrition and pancytopenia.  Patient is on apixaban twice daily 2.5 mg.. Episode of right eye vision loss, he has chronic hypotension blood pressures at home around 87/53 and heart rate has been persistently low.  Cardiology advised him to stop metoprolol and  follow-up blood pressure BP is better but still with very low HRs.    Monocular vision loss DDX: Hypotension and ischemic injury to the right optic nerve due to decreased blood flow, Embolic stroke (need stroke wk/up) or Temporal Arteritis (reports new onset right temporal headache). Ongoing for close to a month and stable so I do not think there is an urgent need to start prednisone but I did discuss possible temporal artery biopsy and they decline right now will see what esr/crp shows but given his multiple chronic symptoms I suspect they may be elevated regardless.   Requested Ophthalmology notes from Dr. 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 I also advised the patient to eat a healthy diet with plenty of whole grains, cereals, fruits and vegetables, exercise regularly and maintain ideal body weight .   Orders Placed This Encounter  Procedures   MR BRAIN W WO CONTRAST   MR ORBITS W WO CONTRAST   MR ANGIO HEAD WO CONTRAST   US Carotid Bilateral   Sedimentation rate   C-reactive protein   Basic Metabolic Panel   No orders of the defined types were placed in this encounter.   Cc: Lavone Orn, MD,  Lavone Orn, MD, [facility]  Sarina Ill, MD  [facility] [address] [place], [place] [address]  Phone [phone] Fax [phone]  I spent 90 minutes of face-to-face and non-face-to-face time with  patient on the  1. Monocular vision loss   2. Right sided temporal headache   3. Dizziness    diagnosis.  This included previsit chart review, lab review, study review, order entry, electronic health record documentation, patient education on the different diagnostic and therapeutic options, counseling and coordination of care, risks and benefits of management, compliance, or risk factor reduction

## 2020-03-23 ENCOUNTER — Encounter: Payer: Self-pay | Admitting: Neurology

## 2020-03-23 ENCOUNTER — Ambulatory Visit: Payer: Medicare PPO | Admitting: Neurology

## 2020-03-23 ENCOUNTER — Other Ambulatory Visit: Payer: Self-pay

## 2020-03-23 VITALS — BP 146/71 | HR 56 | Ht 61.0 in | Wt 105.0 lb

## 2020-03-23 DIAGNOSIS — R42 Dizziness and giddiness: Secondary | ICD-10-CM

## 2020-03-23 DIAGNOSIS — R519 Headache, unspecified: Secondary | ICD-10-CM

## 2020-03-23 DIAGNOSIS — H5460 Unqualified visual loss, one eye, unspecified: Secondary | ICD-10-CM

## 2020-03-23 NOTE — Patient Instructions (Addendum)
MRI of the brain and eye orbits, MRI of the blood vessels of the head, Dopplers of the carotids (neck blood vessels) Blood work today   Temporal Arteritis  Temporal arteritis is a condition that causes arteries to become inflamed. It usually affects arteries in your head and face, but arteries in any part of the body can become inflamed. The condition is also called giant cell arteritis.  Temporal arteritis can cause serious problems such as blindness. Early treatment can help prevent these problems. What are the causes? The cause of this condition is not known. What increases the risk? The following factors may make you more likely to develop this condition:  Being older than 50.  Being a woman.  Being Caucasian.  Being of [place], [place], [place], [place] or [place] ancestry.  Having a family history of the condition.  Having a certain condition that causes muscle pain and stiffness (polymyalgia rheumatica, PMR). What are the signs or symptoms? Some people with temporal arteritis have just one symptom, while others have several symptoms. Most symptoms are related to the head and face. These may include:  Headache.  Hard or swollen temples. This is common. Your temples are the areas on either side of your forehead. If your temples are swollen, it may hurt to touch them.  Pain when combing your hair or when laying your head down.  Pain in the jaw when chewing.  Pain in the throat or tongue.  Problems with your vision, such as sudden loss of vision in one eye, or seeing double. Other symptoms may include:  Fever.  Tiredness (fatigue).  A dry cough.  Pain in the hips and shoulders.  Pain in the arms during exercise.  Depression.  Weight loss. How is this diagnosed? This condition may be diagnosed based on:  Your symptoms.  Your medical history.  A physical exam.  Tests, including: ? Blood tests. ? A test in which a tissue sample is removed from an  artery so it can be examined (biopsy). ? Imaging tests, such as an ultrasound or MRI. How is this treated? This condition may be treated with:  A type of medicine to reduce inflammation (corticosteroid).  Medicines to weaken your immune system (immunosuppressants).  Other medicines to treat vision problems. You will need to see your health care provider while you are being treated. The medicines used to treat this condition can increase your risk of problems such as bone loss and diabetes. During follow-up visits, your health care provider will check for problems by:  Doing blood tests and bone density tests.  Checking your blood pressure and blood sugar. Follow these instructions at home: Medicines  Take over-the-counter and prescription medicines only as told by your health care provider.  Take any vitamins or supplements recommended by your health care provider. These may include vitamin D and calcium, which help keep your bones from becoming weak. Eating and drinking   Eat a heart-healthy diet. This may include: ? Eating high-fiber foods, such as fresh fruits and vegetables, whole grains, and beans. ? Eating heart-healthy fats (omega-3 fats), such as fish, flaxseed, and flaxseed oil. ? Limiting foods that are high in saturated fat and cholesterol, such as processed and fried foods, fatty meat, and full-fat dairy. ? Limiting how much salt (sodium) you eat.  Include calcium and vitamin D in your diet. Good sources of calcium and vitamin D include: ? Low-fat dairy products such as milk, yogurt, and cheese. ? Certain fish, such as fresh or canned  salmon, tuna, and sardines. ? Products that have calcium and vitamin D added to them (fortified products), such as fortified cereals or juice. General instructions  Exercise. Talk with your health care provider about what exercises are okay for you. Exercises that increase your heart rate (aerobic exercise), such as walking, are often  recommended. Aerobic exercise helps control your blood pressure and prevent bone loss.  Stay up to date on all vaccines as directed by your health care provider.  Keep all follow-up visits as told by your health care provider. This is important. Contact a health care provider if:  Your symptoms get worse.  You develop signs of infection, such as fever, swelling, redness, warmth, and tenderness. Get help right away if:  You lose your vision.  Your pain does not go away, even after you take medicine.  You have chest pain.  You have trouble breathing.  One side of your face or body suddenly becomes weak or numb. These symptoms may represent a serious problem that is an emergency. Do not wait to see if the symptoms will go away. Get medical help right away. Call your local emergency services (911 in the [place] Do not drive yourself to the hospital. Summary  Temporal arteritis is a condition that causes arteries to become inflamed. It usually affects arteries in your head and face.  This condition can cause serious problems, such as blindness. Treatment can help prevent these problems.  Symptoms may include hard or tender temples, pain in your jaw when chewing, problems with your vision, or pain in your hips and shoulders.  Take over-the-counter and prescription medicines as told by your health care provider. This information is not intended to replace advice given to you by your health care provider. Make sure you discuss any questions you have with your health care provider. Document Revised: 10/04/2017 Document Reviewed: 10/02/2017 Elsevier Patient Education  [place].  Carotid Artery Disease  Carotid artery disease is the narrowing or blockage of one or both carotid arteries. This condition is also called carotid artery stenosis. The carotid arteries are the two main blood vessels on either side of the neck. They send blood to the brain, other parts of the head, and the  neck.  This condition increases your risk for a stroke or a transient ischemic attack (TIA). A TIA is a "mini-stroke" that causes stroke-like symptoms that go away quickly. What are the causes? This condition is mainly caused by a narrowing and hardening of the carotid arteries. The carotid arteries can become narrow or clogged with a buildup of plaque. Plaque includes:  Fat.  Cholesterol.  Calcium.  Other substances. What increases the risk? The following factors may make you more likely to develop this condition:  Having certain medical conditions, such as: ? High cholesterol. ? High blood pressure. ? Diabetes. ? Obesity.  Smoking.  A family history of cardiovascular disease.  Not being active or lack of regular exercise.  Being male. Men have a higher risk of having arteries become narrow and harden earlier in life than women.  Old age. What are the signs or symptoms? This condition may not have any signs or symptoms until a stroke or TIA happens. In some cases, your doctor may be able to hear a whooshing sound. This can suggest a change in blood flow caused by plaque buildup. An eye exam can also help find signs of the condition. How is this treated? This condition may be treated with more than one treatment.  Treatment options include:  Lifestyle changes, such as: ? Quitting smoking. ? Getting regular exercise, or getting exercise as told by your doctor. ? Eating a healthy diet. ? Managing stress. ? Keeping a healthy weight.  Medicines to control: ? Blood pressure. ? Cholesterol. ? Blood clotting.  Surgery. You may have: ? A surgery to remove the blockages in the carotid arteries. ? A procedure in which a small mesh tube (stent) is used to widen the blocked carotid arteries. Follow these instructions at home: Eating and drinking Follow instructions about your diet from your doctor. It is important to follow a healthy diet.  Eat a diet that includes: ? A lot  of fresh fruits and vegetables. ? Low-fat (lean) meats.  Avoid these foods: ? Foods that are high in fat. ? Foods that are high in salt (sodium). ? Foods that are fried. ? Foods that are processed. ? Foods that have few good nutrients (poor nutritional value).  Lifestyle   Keep a healthy weight.  Do exercises as told by your doctor to stay active. Each week, you should get one of the following: ? At least 150 minutes of exercise that raises your heart rate and makes you sweat (moderate-intensity exercise). ? At least 75 minutes of exercise that takes a lot of effort.  Do not use any products that contain nicotine or tobacco, such as cigarettes, e-cigarettes, and chewing tobacco. If you need help quitting, ask your doctor.  Do not drink alcohol if: ? Your doctor tells you not to drink. ? You are pregnant, may be pregnant, or are planning to become pregnant.  If you drink alcohol: ? Limit how much you use to:  0-1 drink a day for women.  0-2 drinks a day for men. ? Be aware of how much alcohol is in your drink. In the [place] one drink equals one 12 oz bottle of beer (355 mL), one 5 oz glass of wine (148 mL), or one 1 oz glass of hard liquor (44 mL).  Do not use drugs.  Manage your stress. Ask your doctor for tips on how to do this. General instructions  Take over-the-counter and prescription medicines only as told by your doctor.  Keep all follow-up visits as told by your doctor. This is important. Where to find more information  American Heart Association: www.heart.org Get help right away if:  You have any signs of a stroke. "BE FAST" is an easy way to remember the main warning signs: ? B - Balance. Signs are dizziness, sudden trouble walking, or loss of balance. ? E - Eyes. Signs are trouble seeing or a change in how you see. ? F - Face. Signs are sudden weakness or loss of feeling of the face, or the face or eyelid drooping on one side. ? A - Arms. Signs are  weakness or loss of feeling in an arm. This happens suddenly and usually on one side of the body. ? S - Speech. Signs are sudden trouble speaking, slurred speech, or trouble understanding what people say. ? T - Time. Time to call emergency services. Write down what time symptoms started.  You have other signs of a stroke, such as: ? A sudden, very bad headache with no known cause. ? Feeling like you may vomit (nausea). ? Vomiting. ? A seizure. These symptoms may be an emergency. Do not wait to see if the symptoms will go away. Get medical help right away. Call your local emergency services (911 in the [place]  Do not drive yourself to the hospital. Summary  The carotid arteries are blood vessels on both sides of the neck.  If these arteries get smaller or get blocked, you are more likely to have a stroke or a mini-stroke.  This condition can be treated with lifestyle changes, medicines, surgery, or a blend of these treatments.  Get help right away if you have any signs of a stroke. "BE FAST" is an easy way to remember the main warning signs of stroke. This information is not intended to replace advice given to you by your health care provider. Make sure you discuss any questions you have with your health care provider. Document Revised: 03/17/2019 Document Reviewed: 03/17/2019 Elsevier Patient Education  [place].  Ischemic Stroke  An ischemic stroke (cerebrovascular accident, or CVA) is the sudden death of brain tissue that occurs when an area of the brain does not get enough oxygen. It is a medical emergency that must be treated right away. An ischemic stroke can cause permanent loss of brain function. This can cause problems with how different parts of your body function. What are the causes? This condition is caused by a decrease of oxygen supply to an area of the brain, which may be the result of:  A small blood clot (embolus) or a buildup of plaque in the blood vessels  (atherosclerosis) that blocks blood flow in the brain.  An abnormal heart rhythm (atrial fibrillation).  A blocked or damaged artery in the head or neck. Sometimes the cause of stroke is not known (cryptogenic). What increases the risk? Certain factors may make you more likely to develop this condition. Some of these factors are things that you can change, such as:  Obesity.  Smoking cigarettes.  Taking oral birth control, especially if you also use tobacco.  Physical inactivity.  Excessive alcohol use.  Use of illegal drugs, especially cocaine and methamphetamine. Other risk factors include:  High blood pressure (hypertension).  High cholesterol.  Diabetes mellitus.  Heart disease.  Being [place] American, Native American, Hispanic, or [place] Native.  Being over age 33.  Family history of stroke.  Previous history of blood clots, stroke, or transient ischemic attack (TIA).  Sickle cell disease.  Being a woman with a history of preeclampsia.  Migraine headache.  Sleep apnea.  Irregular heartbeats, such as atrial fibrillation.  Chronic inflammatory diseases, such as rheumatoid arthritis or lupus.  Blood clotting disorders (hypercoagulable state). What are the signs or symptoms? Symptoms of this condition usually develop suddenly, or you may notice them after waking up from sleep. Symptoms may include sudden:  Weakness or numbness in your face, arm, or leg, especially on one side of your body.  Trouble walking or difficulty moving your arms or legs.  Loss of balance or coordination.  Confusion.  Slurred speech (dysarthria).  Trouble speaking, understanding speech, or both (aphasia).  Vision changes--such as double vision, blurred vision, or loss of vision--in one or both eyes.  Dizziness.  Nausea and vomiting.  Severe headache with no known cause. The headache is often described as the worst headache ever experienced. If possible, make note of  the exact time that you last felt like your normal self and what time your symptoms started. Tell your health care provider. If symptoms come and go, this could be a sign of a warning stroke, or TIA. Get help right away, even if you feel better. How is this diagnosed? This condition may be diagnosed based on:  Your symptoms,  your medical history, and a physical exam.  CT scan of the brain.  MRI.  CT angiogram. This test uses a computer to take X-rays of your arteries. A dye may be injected into your blood to show the inside of your blood vessels more clearly.  MRI angiogram. This is a type of MRI that is used to evaluate the blood vessels.  Cerebral angiogram. This test uses X-rays and a dye to show the blood vessels in the brain and neck. You may need to see a health care provider who specializes in stroke care. A stroke specialist can be seen in person or through communication using telephone or television technology (telemedicine). Other tests may also be done to find the cause of the stroke, such as:  Electrocardiogram (ECG).  Continuous heart monitoring.  Echocardiogram.  Transesophageal echocardiogram (TEE).  Carotid ultrasound.  A scan of the brain circulation.  Blood tests.  Sleep study to check for sleep apnea. How is this treated? Treatment for this condition will depend on the duration, severity, and cause of your symptoms and on the area of the brain affected. It is very important to get treatment at the first sign of stroke symptoms. Some treatments work better if they are done within 3-6 hours of the onset of stroke symptoms. These initial treatments may include:  Aspirin.  Medicines to control blood pressure.  Medicine given by injection to dissolve the blood clot (thrombolytic).  Treatments given directly to the affected artery to remove or dissolve the blood clot. Other treatment options may include:  Oxygen.  IV fluids.  Medicines to thin the blood  (anticoagulants or antiplatelets).  Procedures to increase blood flow. Medicines and changes to your diet may be used to help treat and manage risk factors for stroke, such as diabetes, high cholesterol, and high blood pressure. After a stroke, you may work with physical, speech, mental health, or occupational therapists to help you recover. Follow these instructions at home: Medicines  Take over-the-counter and prescription medicines only as told by your health care provider.  If you were told to take a medicine to thin your blood, such as aspirin or an anticoagulant, take it exactly as told by your health care provider. ? Taking too much blood-thinning medicine can cause bleeding. ? If you do not take enough blood-thinning medicine, you will not have the protection that you need against another stroke and other problems.  Understand the side effects of taking anticoagulant medicine. When taking this type of medicine, make sure you: ? Hold pressure over any cuts for longer than usual. ? Tell your dentist and other health care providers that you are taking anticoagulants before you have any procedures that may cause bleeding. ? Avoid activities that may cause trauma or injury. Eating and drinking  Follow instructions from your health care provider about diet.  Eat healthy foods.  If your ability to swallow was affected by the stroke, you may need to take steps to avoid choking, such as: ? Taking small bites when eating. ? Eating foods that are soft or pureed. Safety  Follow instructions from your health care team about physical activity.  Use a walker or cane as told by your health care provider.  Take steps to create a safe home environment in order to reduce the risk of falls. This may include: ? Having your home looked at by specialists. ? Installing grab bars in the bedroom and bathroom. ? Using safety equipment, such as raised toilets and a seat  in the shower. General  instructions  Do not use any tobacco products, such as cigarettes, chewing tobacco, and e-cigarettes. If you need help quitting, ask your health care provider.  Limit alcohol intake to no more than 1 drink a day for nonpregnant women and 2 drinks a day for men. One drink equals 12 oz of beer, 5 oz of wine, or 1 oz of hard liquor.  If you need help to stop using drugs or alcohol, ask your health care provider about a referral to a program or specialist.  Maintain an active and healthy lifestyle. Get regular exercise as told by your health care provider.  Keep all follow-up visits as told by your health care provider, including visits with all specialists on your health care team. This is important. How is this prevented? Your risk of another stroke can be decreased by managing high blood pressure, high cholesterol, diabetes, heart disease, sleep apnea, and obesity. It can also be decreased by quitting smoking, limiting alcohol, and staying physically active. Your health care provider will continue to work with you on measures to prevent short-term and long-term complications of stroke. Get help right away if:   You have any symptoms of a stroke. "BE FAST" is an easy way to remember the main warning signs of a stroke: ? B - Balance. Signs are dizziness, sudden trouble walking, or loss of balance. ? E - Eyes. Signs are trouble seeing or a sudden change in vision. ? F - Face. Signs are sudden weakness or numbness of the face, or the face or eyelid drooping on one side. ? A - Arms. Signs are weakness or numbness in an arm. This happens suddenly and usually on one side of the body. ? S - Speech. Signs are sudden trouble speaking, slurred speech, or trouble understanding what people say. ? T - Time. Time to call emergency services. Write down what time symptoms started.  You have other signs of a stroke, such as: ? A sudden, severe headache with no known cause. ? Nausea or  vomiting. ? Seizure.  These symptoms may represent a serious problem that is an emergency. Do not wait to see if the symptoms will go away. Get medical help right away. Call your local emergency services (911 in the [place] Do not drive yourself to the hospital. Summary  An ischemic stroke (cerebrovascular accident, or CVA) is the sudden death of brain tissue that occurs when an area of the brain does not get enough oxygen.  Symptoms of this condition usually develop suddenly, or you may notice them after waking up from sleep.  It is very important to get treatment at the first sign of stroke symptoms. Stroke is a medical emergency that must be treated right away. This information is not intended to replace advice given to you by your health care provider. Make sure you discuss any questions you have with your health care provider. Document Revised: 05/10/2018 Document Reviewed: 11/17/2015 Elsevier Patient Education  [place].

## 2020-03-24 LAB — SEDIMENTATION RATE: Sed Rate: 3 mm/hr (ref 0–30)

## 2020-03-24 LAB — BASIC METABOLIC PANEL
BUN/Creatinine Ratio: 16 (ref 10–24)
BUN: 22 mg/dL (ref 8–27)
CO2: 27 mmol/L (ref 20–29)
Calcium: 9.2 mg/dL (ref 8.6–10.2)
Chloride: 101 mmol/L (ref 96–106)
Creatinine, Ser: 1.37 mg/dL — ABNORMAL HIGH (ref 0.76–1.27)
GFR calc Af Amer: 55 mL/min/{1.73_m2} — ABNORMAL LOW (ref >59)
GFR calc non Af Amer: 47 mL/min/{1.73_m2} — ABNORMAL LOW (ref >59)
Glucose: 93 mg/dL (ref 65–99)
Potassium: 4.3 mmol/L (ref 3.5–5.2)
Sodium: 142 mmol/L (ref 134–144)

## 2020-03-24 LAB — C-REACTIVE PROTEIN: CRP: <1 mg/L (ref 0–10)

## 2020-03-25 ENCOUNTER — Other Ambulatory Visit: Payer: Self-pay | Admitting: Neurology

## 2020-03-25 DIAGNOSIS — H5460 Unqualified visual loss, one eye, unspecified: Secondary | ICD-10-CM

## 2020-03-25 DIAGNOSIS — R519 Headache, unspecified: Secondary | ICD-10-CM

## 2020-03-25 DIAGNOSIS — R42 Dizziness and giddiness: Secondary | ICD-10-CM

## 2020-03-29 DIAGNOSIS — H35363 Drusen (degenerative) of macula, bilateral: Secondary | ICD-10-CM | POA: Diagnosis not present

## 2020-03-29 DIAGNOSIS — H353211 Exudative age-related macular degeneration, right eye, with active choroidal neovascularization: Secondary | ICD-10-CM | POA: Diagnosis not present

## 2020-03-29 DIAGNOSIS — H35372 Puckering of macula, left eye: Secondary | ICD-10-CM | POA: Diagnosis not present

## 2020-03-29 DIAGNOSIS — Z961 Presence of intraocular lens: Secondary | ICD-10-CM | POA: Diagnosis not present

## 2020-03-29 DIAGNOSIS — H53131 Sudden visual loss, right eye: Secondary | ICD-10-CM | POA: Diagnosis not present

## 2020-03-29 DIAGNOSIS — H353122 Nonexudative age-related macular degeneration, left eye, intermediate dry stage: Secondary | ICD-10-CM | POA: Diagnosis not present

## 2020-03-29 DIAGNOSIS — G453 Amaurosis fugax: Secondary | ICD-10-CM | POA: Diagnosis not present

## 2020-03-29 DIAGNOSIS — H43813 Vitreous degeneration, bilateral: Secondary | ICD-10-CM | POA: Diagnosis not present

## 2020-03-30 ENCOUNTER — Other Ambulatory Visit: Payer: Self-pay

## 2020-03-30 ENCOUNTER — Ambulatory Visit (HOSPITAL_COMMUNITY)
Admission: RE | Admit: 2020-03-30 | Discharge: 2020-03-30 | Disposition: A | Discharge status: Home / Self Care | Payer: Medicare PPO | Source: Ambulatory Visit | Attending: Neurology | Admitting: Neurology

## 2020-03-30 DIAGNOSIS — R42 Dizziness and giddiness: Secondary | ICD-10-CM | POA: Insufficient documentation

## 2020-03-30 DIAGNOSIS — R519 Headache, unspecified: Secondary | ICD-10-CM | POA: Diagnosis not present

## 2020-03-30 DIAGNOSIS — H5460 Unqualified visual loss, one eye, unspecified: Secondary | ICD-10-CM | POA: Diagnosis not present

## 2020-03-30 NOTE — Progress Notes (Signed)
Patient Care Team: Lavone Orn, MD as PCP - General (Internal Medicine) Sueanne Margarita, MD as PCP - Cardiology (Cardiology) Sueanne Margarita, MD as Consulting Physician (Cardiology)  DIAGNOSIS:    ICD-10-CM   1. Other pancytopenia ([place])  U13.244     CHIEF COMPLIANT: Follow-up of anemia,thrombocytopenia  INTERVAL HISTORY: Joshua Hudson is a 85 y.o. with above-mentioned history of anemia and thrombocytopenia. He presents to the clinic todayto review his labs.  Patient recently had symptoms of stroke with loss of vision in his right eye which has since recovered.  He is seeing neurology who is working him up with a brain MRI soon. He feels tired quite often.  ALLERGIES:  has No Known Allergies.  MEDICATIONS:  Current Outpatient Medications  Medication Sig Dispense Refill  . amiodarone (PACERONE) 200 MG tablet Take 1 tablet (200 mg total) by mouth daily. 90 tablet 3  . apixaban (ELIQUIS) 2.5 MG TABS tablet Take 1 tablet (2.5 mg total) by mouth 2 (two) times daily. 180 tablet 1  . finasteride (PROSCAR) 5 MG tablet Take 5 mg by mouth daily as needed (retention).     . folic acid (FOLVITE) 1 MG tablet Take 1 mg by mouth daily.    . furosemide (LASIX) 40 MG tablet Take 1 tablet (40 mg total) by mouth daily. 60 tablet 0  . leflunomide (ARAVA) 20 MG tablet Take 20 mg by mouth daily.    . Multiple Vitamin (MULTIVITAMIN WITH MINERALS) TABS tablet Take 1 tablet by mouth daily.    . pantoprazole (PROTONIX) 40 MG tablet Take 40 mg by mouth daily before breakfast.     . Polyethyl Glycol-Propyl Glycol (LUBRICANT EYE DROPS) 0.4-0.3 % SOLN Place 1 drop into both eyes 3 (three) times daily as needed (dry/irritated eyes.).    Marland Kitchen potassium chloride SA (KLOR-CON) 20 MEQ tablet Take 1 tablet (20 mEq total) by mouth daily. 60 tablet 0  . predniSONE (DELTASONE) 5 MG tablet Take 5 mg by mouth daily with breakfast.      No current facility-administered medications for this visit.    PHYSICAL  EXAMINATION: ECOG PERFORMANCE STATUS: 1 - Symptomatic but completely ambulatory  Vitals:   03/31/20 1130  BP: (!) 104/58  Pulse: 52  Resp: 18  Temp: 98.3 F (36.8 C)  SpO2: 100%   Filed Weights   03/31/20 1130  Weight: 104 lb 12.8 oz (47.5 kg)    LABORATORY DATA:  I have reviewed the data as listed CMP Latest Ref Rng & Units 03/23/2020 02/09/2020 12/19/2019  Glucose 65 - 99 mg/dL 93 - 110(H)  BUN 8 - 27 mg/dL 22 - 17  Creatinine 0.76 - 1.27 mg/dL 1.37(H) - 1.39(H)  Sodium 134 - 144 mmol/L 142 - 141  Potassium 3.5 - 5.2 mmol/L 4.3 - 4.3  Chloride 96 - 106 mmol/L 101 - 104  CO2 20 - 29 mmol/L 27 - 30(H)  Calcium 8.6 - 10.2 mg/dL 9.2 - 9.0  Total Protein 6.0 - 8.5 g/dL - 6.4 -  Total Bilirubin 0.0 - 1.2 mg/dL - 0.4 -  Alkaline Phos 48 - 121 IU/L - 49 -  AST 0 - 40 IU/L - 24 -  ALT 0 - 44 IU/L - 13 -    Lab Results  Component Value Date   WBC 7.9 03/31/2020   HGB 9.2 (L) 03/31/2020   HCT 29.2 (L) 03/31/2020   MCV 69.0 (L) 03/31/2020   PLT 125 (L) 03/31/2020   NEUTROABS 6.2 03/31/2020  ASSESSMENT & PLAN:  Other pancytopenia ([place]) On 05/06/2019: Hemoglobin 7.6: Blood transfusion given Hemoglobin electrophoresis: Beta thalassemia minor.This is the cause of microcytosis. It is not iron deficiency related.  Possible differential: Myelodysplastic syndrome versus methotrexate related toxicity. His methotrexate has been on hold. Hospitalization 09/22/2019-09/25/2019: A. fib with RVR status post cardioversion, currently on Eliquis  Lab review:  09/25/2019: WBC 7.2, hemoglobin 9.6, platelets 145 10/03/19: WBC: 7.5, Hb 8.9, Pl: 135 03/31/2020: WBC 7.9, Hb 9.2, platelets 125  Even though he has microcytic anemia, this microcytosis is related to beta thalassemia. His anemia is due to chronic disease.     He developed symptoms of stroke with the loss of vision in the right eye which was transient.  He is getting work-up with neurology. This is another reason why I do not  want to start him on growth factor injections which can increase his risk of blood clots and stroke.  His hemoglobin is stable and therefore we will continue to watch and monitor.  Return to clinic in 6 months with labs and follow-up    No orders of the defined types were placed in this encounter.  The patient has a good understanding of the overall plan. he agrees with it. he will call with any problems that may develop before the next visit here.  Total time spent: 20 mins including face to face time and time spent for planning, charting and coordination of care  Nicholas Lose, MD 03/31/2020  I, Cloyde Reams Dorshimer, am acting as scribe for Dr. Nicholas Lose.  I have reviewed the above documentation for accuracy and completeness, and I agree with the above.

## 2020-03-31 ENCOUNTER — Inpatient Hospital Stay: Payer: Medicare PPO | Attending: Hematology and Oncology

## 2020-03-31 ENCOUNTER — Inpatient Hospital Stay: Payer: Medicare PPO | Admitting: Hematology and Oncology

## 2020-03-31 DIAGNOSIS — D61818 Other pancytopenia: Secondary | ICD-10-CM | POA: Insufficient documentation

## 2020-03-31 DIAGNOSIS — Z7901 Long term (current) use of anticoagulants: Secondary | ICD-10-CM | POA: Diagnosis not present

## 2020-03-31 DIAGNOSIS — I4891 Unspecified atrial fibrillation: Secondary | ICD-10-CM | POA: Diagnosis not present

## 2020-03-31 DIAGNOSIS — D563 Thalassemia minor: Secondary | ICD-10-CM | POA: Diagnosis not present

## 2020-03-31 LAB — CBC WITH DIFFERENTIAL (CANCER CENTER ONLY)
Abs Immature Granulocytes: 0.05 10*3/uL (ref 0.00–0.07)
Basophils Absolute: 0 10*3/uL (ref 0.0–0.1)
Basophils Relative: 1 %
Eosinophils Absolute: 0.1 10*3/uL (ref 0.0–0.5)
Eosinophils Relative: 2 %
HCT: 29.2 % — ABNORMAL LOW (ref 39.0–52.0)
Hemoglobin: 9.2 g/dL — ABNORMAL LOW (ref 13.0–17.0)
Immature Granulocytes: 1 %
Lymphocytes Relative: 12 %
Lymphs Abs: 1 10*3/uL (ref 0.7–4.0)
MCH: 21.7 pg — ABNORMAL LOW (ref 26.0–34.0)
MCHC: 31.5 g/dL (ref 30.0–36.0)
MCV: 69 fL — ABNORMAL LOW (ref 80.0–100.0)
Monocytes Absolute: 0.5 10*3/uL (ref 0.1–1.0)
Monocytes Relative: 6 %
Neutro Abs: 6.2 10*3/uL (ref 1.7–7.7)
Neutrophils Relative %: 78 %
Platelet Count: 125 10*3/uL — ABNORMAL LOW (ref 150–400)
RBC: 4.23 MIL/uL (ref 4.22–5.81)
RDW: 17.6 % — ABNORMAL HIGH (ref 11.5–15.5)
WBC Count: 7.9 10*3/uL (ref 4.0–10.5)
nRBC: 0.5 % — ABNORMAL HIGH (ref 0.0–0.2)

## 2020-03-31 LAB — SAMPLE TO BLOOD BANK

## 2020-03-31 LAB — CMP (CANCER CENTER ONLY)
ALT: 11 U/L (ref 0–44)
AST: 20 U/L (ref 15–41)
Albumin: 3.8 g/dL (ref 3.5–5.0)
Alkaline Phosphatase: 38 U/L (ref 38–126)
Anion gap: 9 (ref 5–15)
BUN: 26 mg/dL — ABNORMAL HIGH (ref 8–23)
CO2: 30 mmol/L (ref 22–32)
Calcium: 9.6 mg/dL (ref 8.9–10.3)
Chloride: 104 mmol/L (ref 98–111)
Creatinine: 1.73 mg/dL — ABNORMAL HIGH (ref 0.61–1.24)
GFR, Est AFR Am: 41 mL/min — ABNORMAL LOW (ref >60)
GFR, Estimated: 36 mL/min — ABNORMAL LOW (ref >60)
Glucose, Bld: 98 mg/dL (ref 70–99)
Potassium: 4.3 mmol/L (ref 3.5–5.1)
Sodium: 143 mmol/L (ref 135–145)
Total Bilirubin: 0.6 mg/dL (ref 0.3–1.2)
Total Protein: 6.2 g/dL — ABNORMAL LOW (ref 6.5–8.1)

## 2020-03-31 LAB — IRON AND TIBC
Iron: 96 ug/dL (ref 42–163)
Saturation Ratios: 44 % (ref 20–55)
TIBC: 219 ug/dL (ref 202–409)
UIBC: 123 ug/dL (ref 117–376)

## 2020-03-31 LAB — FERRITIN: Ferritin: 710 ng/mL — ABNORMAL HIGH (ref 24–336)

## 2020-03-31 NOTE — Assessment & Plan Note (Signed)
On 05/06/2019: Hemoglobin 7.6: Blood transfusion given Hemoglobin electrophoresis: Beta thalassemia minor.This is the cause of microcytosis. It is not iron deficiency related.  Possible differential: Myelodysplastic syndrome versus methotrexate related toxicity. His methotrexate has been on hold. Hospitalization 09/22/2019-09/25/2019: A. fib with RVR status post cardioversion, currently on Eliquis  Lab review:  09/25/2019: WBC 7.2, hemoglobin 9.6, platelets 145 10/03/19: WBC: 7.5, Hb 8.9, Pl: 135 03/31/2020:  Even though he has microcytic anemia, this microcytosis is related to beta thalassemia. His anemia is due to chronic disease.   Because he is asymptomatic, we decided not to initiate Aranesp at this time  Return to clinic in 6 months with labs and follow-up

## 2020-04-01 LAB — ERYTHROPOIETIN: Erythropoietin: 21.3 m[IU]/mL — ABNORMAL HIGH (ref 2.6–18.5)

## 2020-04-05 ENCOUNTER — Encounter (HOSPITAL_COMMUNITY): Payer: Medicare PPO

## 2020-04-12 NOTE — Progress Notes (Signed)
[facility] [place] [facility]                                       Cardiology Office Note    Date:  04/14/2020   ID:  Joshua Hudson, DOB 13-Nov-1934, MRN [id number]  PCP:  Lavone Orn, MD  Cardiologist: [name], MD / Dr. Burt Knack & Dr. Roxy Manns (TAVR)  CC: 1 year s/p TAVR  History of Present Illness:  Joshua Hudson is a 85 y.o. male with a history of severe AS s/p AVR with a 12mm Edwards bioprosthetic valve in 2009, HTN, rheumatoid arthritis on MTX, leflunomide and prednisone, achalasia, mod MR, chronic combined S/D CHF, hemolytic anemia, aflutter s/p CTI ablation on amiodarone and Eliquis,bioprosthetic valve failure with severe AIs/p valve-in-valve TAVR (04/15/19) who presents for follow up.  Patient underwent aortic valve replacement using a 25 mm Edwards magna stented bovine pericardial tissue valve by Dr. Servando Snare in 2009 for severe symptomatic aortic stenosis. His early postoperative recovery was notable for postoperative atrial fibrillation which has not recurred. Last year he was evaluated for pre op clearance for redo shoulder surgery.Follow-up TTE on 03/18/2019 revealed normal left ventricular systolic function with at least moderate AI. Subsequent TEE on 04/02/2019 confirmed the presence of severe prosthetic valve dysfunction with severe AI. There was no vegetation on the aortic valve she suggest endocarditis. Cardiac catheterization on 04/02/2019 showed normal coronaries.He was also noted to have worsening anemia. Haptoglobin was low c/w hemolytic anemia.  He underwentsuccessfulvalve in valveTAVR with a42mm Edwards Sapien UltraTHV via the TF approach on 04/15/19. Post operative echoshowed EF 60-65%, normally functioning TAVR with mean gradient of 14 mmHg and no AI.He was transfused 2 U PRBCs for hemolytic anemia felt to be related to his severe AI. He also developed post op thrombocytopeniawith platelets down to 46K.He was discharged  on POD1 on aspirin and plavix. 1 month echo showed normal EF with TAVR functioning normally with mean gradient of 14 mm Hg and trivial PVL. He is followed by Dr. Lindi Adie with hematology for anemia.Marland Kitchen  He later developed atrial fib/flutter requiring admission in 09/2019. Echo at that time showed EF 45-50%. He underwent TEE/DCCV. He later underwent CTI ablation in 10/2019 by Dr. Lovena Le. Follow up echo 02/25/20 showed EF 45-50%, normally functioning TAVR with a mean gradient of 12 mm hg and no PVL.  Recently had monocular vision loss and told he had a stroke. CT head on 7/13 did not show any acute abnormality. Saw Dr. [name] and scheduled for MRI in late August. Carotid opplers were normal. Now has return of vision with occasional blurriness.   Today he presents to clinic for follow up. He is here with his daughter. He continues to feel poorly with ongoing fatigue and dyspnea. Any activity makes him tired and short of breath. No chest pain.  No LE edema, orthopnea or PND. He has been having ongoing dizziness since the time of his monocular vision loss but no syncope. Dizziness seems to get worse when he stands up. Has had low BPs at home with SBP in 80s. No blood in stool or urine. No palpitations.    Past Medical History:  Diagnosis Date  . Achalasia 06/12/2019   Noted on EGD  . Aortic insufficiency 03/24/2019   AI of bioprosthetic AVR severe 4 plus  . Chronic diastolic CHF (congestive heart failure) ([place]) 03/24/2019  .  Colon polyps    s/p diverticular perforation requiring 2-stage repair  . Derangement of right shoulder joint    need replacing has no use of  . Diverticulosis   . DJD (degenerative joint disease), lumbosacral   . Dysphagia    eats soft food  . Esophageal stricture   . GERD (gastroesophageal reflux disease)   . Hemolytic anemia ([place])   . History of blood transfusion given with 04-15-19 surgery  . History of blood transfusion 07/18/2019  . History of kidney stones    passed stones   . Hypertension   . Mitral regurgitation    moderate  . Occipital neuralgia   . Pancytopenia ([place])   . Postoperative atrial fibrillation ([place]) 05/10/2015  . Prosthetic valve dysfunction   . Rheumatoid arthritis ([place])    s/o long term steroids  shoulders and hands  . Rotator cuff arthropathy    right  . S/P aortic valve replacement with bioprosthetic valve 01/16/2008   42mm Edwards Magna perimount bovine pericardial tissue valve, model 3000  . S/P valve-in-valve TAVR 04/15/2019   26 mm Edwards Sapien 3 Ultra transcatheter heart valve placed via percutaneous right transfemoral approach   . SBE (subacute bacterial endocarditis) prophylaxis candidate    for dental procedures  . Schatzki's ring 06/12/2019   Narrowing, Noted on EGD  . Severe aortic stenosis    S/P prosthetic valve replacement w 25 mm Edwards like science percardial tissue valve,[place] - 01/2008  . Thrombocytopenia ([place])     Past Surgical History:  Procedure Laterality Date  . A-FLUTTER ABLATION N/A 10/10/2019   Procedure: A-FLUTTER ABLATION;  Surgeon: Evans Lance, MD;  Location: [place] [facility];  Service: Cardiovascular;  Laterality: N/A;  . APPENDECTOMY    . BALLOON DILATION N/A 06/12/2019   Procedure: BALLOON DILATION;  Surgeon: Ronnette Juniper, MD;  Location: [facility];  Service: Gastroenterology;  Laterality: N/A;  . BOTOX INJECTION N/A 01/22/2018   Procedure: BOTOX INJECTION;  Surgeon: Ronnette Juniper, MD;  Location: [facility];  Service: Gastroenterology;  Laterality: N/A;  . CARDIAC CATHETERIZATION     09  . CARDIAC VALVE REPLACEMENT  01/2008   aortic valve replacement  . CARDIOVERSION N/A 09/25/2019   Procedure: CARDIOVERSION;  Surgeon: Jerline Pain, MD;  Location: [facility] ENDOSCOPY;  Service: Cardiovascular;  Laterality: N/A;  . CARDIOVERSION N/A 12/25/2019   Procedure: CARDIOVERSION;  Surgeon: Lelon Perla, MD;  [facility];  Service: Cardiovascular;  Laterality: N/A;  . CATARACT EXTRACTION W/  INTRAOCULAR LENS  IMPLANT, BILATERAL    . COLON RESECTION     diverticulitis   . CORONARY ANGIOPLASTY    . dental implants     permanent  . ESOPHAGEAL MANOMETRY N/A 11/07/2017   Procedure: ESOPHAGEAL MANOMETRY (EM);  Surgeon: Ronnette Juniper, MD;  Location: [facility];  Service: Gastroenterology;  Laterality: N/A;  . ESOPHAGOGASTRODUODENOSCOPY (EGD) WITH PROPOFOL N/A 01/22/2018   Procedure: ESOPHAGOGASTRODUODENOSCOPY (EGD) WITH PROPOFOL;  Surgeon: Ronnette Juniper, MD;  [facility]: [facility];  Service: Gastroenterology;  Laterality: N/A;  . ESOPHAGOGASTRODUODENOSCOPY (EGD) WITH PROPOFOL N/A 04/30/2019   Procedure: ESOPHAGOGASTRODUODENOSCOPY (EGD) WITH PROPOFOL;  Surgeon: Laurence Spates, MD;  Location: [place];  Service: Endoscopy;  Laterality: N/A;  . ESOPHAGOGASTRODUODENOSCOPY (EGD) WITH PROPOFOL N/A 06/12/2019   Procedure: ESOPHAGOGASTRODUODENOSCOPY (EGD) WITH PROPOFOL;  Surgeon: Ronnette Juniper, MD;  [facility];  Service: Gastroenterology;  Laterality: N/A;  with botox injection  . EXCISIONAL TOTAL SHOULDER ARTHROPLASTY WITH ANTIBIOTIC SPACER Right 12/31/2018   Procedure: EXCISIONAL TOTAL SHOULDER ARTHROPLASTY WITH ANTIBIOTIC  SPACER;  Surgeon: Netta Cedars, MD;  Location: [place];  Service: Orthopedics;  Laterality: Right;  . HERNIA REPAIR    . INTRAOPERATIVE TRANSTHORACIC ECHOCARDIOGRAM N/A 04/15/2019   Procedure: Intraoperative Transthoracic Echocardiogram;  Surgeon: Sherren Mocha, MD;  Location: [place];  Service: Open Heart Surgery;  Laterality: N/A;  . IRRIGATION AND DEBRIDEMENT SHOULDER Right 11/20/2018    IRRIGATION AND DEBRIDEMENT SHOULDER WITH POLY EXCHANGE (Right Shoulder)  . IRRIGATION AND DEBRIDEMENT SHOULDER Right 11/20/2018   Procedure: IRRIGATION AND DEBRIDEMENT SHOULDER WITH POLY EXCHANGE;  Surgeon: Netta Cedars, MD;  Location: [place];  Service: Orthopedics;  Laterality: Right;  . LUMBAR LAMINECTOMY     x 2  . REVERSE SHOULDER ARTHROPLASTY Right 03/01/2018   Procedure: RIGHT  REVERSE SHOULDER ARTHROPLASTY;  Surgeon: Netta Cedars, MD;  Location: [place];  Service: Orthopedics;  Laterality: Right;  . REVERSE SHOULDER ARTHROPLASTY Right 07/18/2019   Procedure: REVERSE TOTAL SHOULDER ARTHROPLASTY and removal of antiobotic spacer;  Surgeon: Netta Cedars, MD;  Location: [facility];  Service: Orthopedics;  Laterality: Right;  interscalene block  . REVERSE SHOULDER ARTHROPLASTY Right 08/06/2019   Procedure: Reduction of dislocated reverse total shoulder and poly exchange;  Surgeon: Netta Cedars, MD;  Location: [facility];  Service: Orthopedics;  Laterality: Right;  need 1 hour  . RIGHT/LEFT HEART CATH AND CORONARY ANGIOGRAPHY N/A 04/02/2019   Procedure: RIGHT/LEFT HEART CATH AND CORONARY ANGIOGRAPHY;  Surgeon: Leonie Man, MD;  Location: [place] [facility];  Service: Cardiovascular;  Laterality: N/A;  . SAVORY DILATION N/A 01/22/2018   Procedure: SAVORY DILATION;  Surgeon: Ronnette Juniper, MD;  [facility];  Service: Gastroenterology;  Laterality: N/A;  . SAVORY DILATION N/A 04/30/2019   Procedure: SAVORY DILATION;  Surgeon: Laurence Spates, MD;  Location: [place];  Service: Endoscopy;  Laterality: N/A;  With fluro  . SHOULDER HEMI-ARTHROPLASTY Right 06/14/2018   Procedure: RIGHT  REVERSE TOTAL SHOULDER OPEN POLY EXCHANGE;  Surgeon: Netta Cedars, MD;  Location: [place];  Service: Orthopedics;  Laterality: Right;  . SUBMUCOSAL INJECTION  06/12/2019   Procedure: SUBMUCOSAL INJECTION;  Surgeon: Ronnette Juniper, MD;  Location[facility];  Service: Gastroenterology;;  . TEE WITHOUT CARDIOVERSION N/A 01/29/2014   Procedure: TRANSESOPHAGEAL ECHOCARDIOGRAM (TEE);  Surgeon: Sueanne Margarita, MD;  Location: [facility] ENDOSCOPY;  Service: Cardiovascular;  Laterality: N/A;  . TEE WITHOUT CARDIOVERSION N/A 04/02/2019   Procedure: TRANSESOPHAGEAL ECHOCARDIOGRAM (TEE);  Surgeon: Josue Hector, MD;  Location: [facility] ENDOSCOPY;  Service: Cardiovascular;  Laterality: N/A;  . TEE WITHOUT CARDIOVERSION N/A  09/25/2019   Procedure: TRANSESOPHAGEAL ECHOCARDIOGRAM (TEE);  Surgeon: Jerline Pain, MD;  Location: [facility] ENDOSCOPY;  Service: Cardiovascular;  Laterality: N/A;  . TONSILLECTOMY    . TRANSCATHETER AORTIC VALVE REPLACEMENT, TRANSFEMORAL  04/15/2019  . TRANSCATHETER AORTIC VALVE REPLACEMENT, TRANSFEMORAL N/A 04/15/2019   Procedure: TRANSCATHETER AORTIC VALVE REPLACEMENT, TRANSFEMORAL with POST BALLOON DILATION;  Surgeon: Sherren Mocha, MD;  Location: [place];  Service: Open Heart Surgery;  Laterality: N/A;    Current Medications: Outpatient Medications Prior to Visit  Medication Sig Dispense Refill  . amiodarone (PACERONE) 200 MG tablet Take 1 tablet (200 mg total) by mouth daily. 90 tablet 3  . apixaban (ELIQUIS) 2.5 MG TABS tablet Take 1 tablet (2.5 mg total) by mouth 2 (two) times daily. 180 tablet 1  . finasteride (PROSCAR) 5 MG tablet Take 5 mg by mouth daily as needed (retention).     . folic acid (FOLVITE) 1 MG tablet Take 1 mg by mouth daily.    Marland Kitchen  leflunomide (ARAVA) 20 MG tablet Take 20 mg by mouth daily.    . Multiple Vitamin (MULTIVITAMIN WITH MINERALS) TABS tablet Take 1 tablet by mouth daily.    . pantoprazole (PROTONIX) 40 MG tablet Take 40 mg by mouth daily before breakfast.     . Polyethyl Glycol-Propyl Glycol (LUBRICANT EYE DROPS) 0.4-0.3 % SOLN Place 1 drop into both eyes 3 (three) times daily as needed (dry/irritated eyes.).    Marland Kitchen predniSONE (DELTASONE) 5 MG tablet Take 5 mg by mouth daily with breakfast.     . furosemide (LASIX) 40 MG tablet Take 1 tablet (40 mg total) by mouth daily. 60 tablet 0  . potassium chloride SA (KLOR-CON) 20 MEQ tablet Take 1 tablet (20 mEq total) by mouth daily. 60 tablet 0   No facility-administered medications prior to visit.     Allergies:   Patient has no known allergies.   Social History   Socioeconomic History  . Marital status: Widowed    Spouse name: Not on file  . Number of children: 2  . Years of education: Not on file  . Highest  education level: Not on file  Occupational History  . Occupation: Retired Market researcher at [employer]  . Smoking status: Former Smoker    Packs/day: 0.50    Years: 10.00    Pack years: 5.00    Types: Cigarettes    Start date: 01/16/1974    Quit date: 09/05/1983    Years since quitting: 36.6  . Smokeless tobacco: Former Systems developer    Types: Secondary school teacher  . Vaping Use: Never used  Substance and Sexual Activity  . Alcohol use: Not Currently    Alcohol/week: 0.0 standard drinks  . Drug use: No  . Sexual activity: Not on file  Other Topics Concern  . Not on file  Social History Narrative  . Not on file   Social Determinants of Health   Financial Resource Strain:   . Difficulty of Paying Living Expenses:   Food Insecurity:   . Worried About Charity fundraiser in the Last Year:   . Arboriculturist in the Last Year:   Transportation Needs:   . Film/video editor (Medical):   Marland Kitchen Lack of Transportation (Non-Medical):   Physical Activity:   . Days of Exercise per Week:   . Minutes of Exercise per Session:   Stress:   . Feeling of Stress :   Social Connections:   . Frequency of Communication with Friends and Family:   . Frequency of Social Gatherings with Friends and Family:   . Attends Religious Services:   . Active Member of Clubs or Organizations:   . Attends Archivist Meetings:   Marland Kitchen Marital Status:      Family History:  The patient's family history includes Arthritis in his father; Heart Problems in his sister; Heart disease in his father; Other in his brother, daughter, mother, and sister; Suicidality in his brother.     ROS:   Please see the history of present illness.    ROS All other systems reviewed and are negative.   PHYSICAL EXAM:   VS:  BP (!) 106/50   Pulse 62   Ht 5\' 1"  (1.549 m)   Wt 107 lb (48.5 kg)   SpO2 98%   BMI 20.22 kg/m    GEN: thin an frail appearing.  HEENT: normal Neck: no JVD or masses Cardiac: RRR; soft flow murmur. No rubs,  or gallops,no  edema  Respiratory:  clear to auscultation bilaterally, normal work of breathing GI: soft, nontender, nondistended, + BS MS: no deformity or atrophy Skin: warm and dry, no rash Neuro:  Alert and Oriented x 3, Strength and sensation are intact Psych: euthymic mood, full affect   Wt Readings from Last 3 Encounters:  04/14/20 107 lb (48.5 kg)  03/31/20 104 lb 12.8 oz (47.5 kg)  03/23/20 105 lb (47.6 kg)      Studies/Labs Reviewed:   EKG:  EKG is NOT ordered today.    Recent Labs: 04/23/2019: NT-Pro BNP 1,582 09/22/2019: B Natriuretic Peptide 175.3; Magnesium 2.2 02/09/2020: TSH 4.990 03/31/2020: ALT 11; BUN 26; Creatinine 1.73; Hemoglobin 9.2; Platelet Count 125; Potassium 4.3; Sodium 143   Lipid Panel    Component Value Date/Time   CHOL  01/14/2008 0450    174        ATP III CLASSIFICATION:  <200     mg/dL   Desirable  200-239  mg/dL   Borderline High  >=240    mg/dL   High   TRIG 43 01/14/2008 0450   HDL 65 01/14/2008 0450   CHOLHDL 2.7 01/14/2008 0450   VLDL 9 01/14/2008 0450   LDLCALC (H) 01/14/2008 0450    100        Total Cholesterol/HDL:CHD Risk Coronary Heart Disease Risk Table                     Men   Women  1/2 Average Risk   3.4   3.3    Additional studies/ records that were reviewed today include:  Echo 02/25/20 IMPRESSIONS  1. Left ventricular ejection fraction, by estimation, is 45 to 50%. The  left ventricle has mildly decreased function. The left ventricle has no  regional wall motion abnormalities. Left ventricular diastolic parameters  are consistent with Grade II  diastolic dysfunction (pseudonormalization).  2. Right ventricular systolic function is mildly reduced. The right  ventricular size is normal. There is normal pulmonary artery systolic  pressure. The estimated right ventricular systolic pressure is 79.3 mmHg.  3. Left atrial size was moderately dilated.  4. Right atrial size was moderately dilated.  5. The mitral  valve is normal in structure. Mild mitral valve  regurgitation. No evidence of mitral stenosis.  6. S/p valve in valve TAVR 04/15/2019. No valvular of paravalvular  regurgitation. The aortic valve has been repaired/replaced. Aortic valve  regurgitation is not visualized. There is a 26 mm Edwards Sapien  prosthetic (TAVR) valve present in the aortic  position. Procedure Date: 04/15/19. Aortic valve mean gradient measures  12.0 mmHg.  7. The inferior vena cava is normal in size with greater than 50%  respiratory variability, suggesting right atrial pressure of 3 mmHg.    ASSESSMENT & PLAN:   S/p Valve in valve TAVR: echo 02/25/20 showed EF 45-50%, normally functioning TAVR with a mean gradient of 12 mm hg and no PVL. He has NYHA class III symptoms which have been chronic. He has amoxicillin for SBE prophylaxis. Continue on Eliquis alone for afib. He will continue regular follow up with Dr. Radford Pax.   HTN: BP has been on soft side and having some orthostasis. Only BP med is Lasix. He has not had any LE edema or s/s CHF in quite some time. I have asked him to change his lasix/Kdur from scheduled daily to PRN and see if that help his symptoms.   PAF: sounds regular on exam today. Continue Eliquis 2.5mg  BID. Continue  amiodarone.   Chronic combined S/D CHF: EF 45-50% on most recent echo. No s/s CHF. Change lasix to PRN as above. Heart failure therapy limited by hypotension.   Medication Adjustments/Labs and Tests Ordered: Current medicines are reviewed at length with the patient today.  Concerns regarding medicines are outlined above.  Medication changes, Labs and Tests ordered today are listed in the Patient Instructions below. Patient Instructions  Medication Instructions:  Your physician has recommended you make the following change in your medication:  1.) change Lasix (furosemide) 40 mg - to one tablet daily as needed. 2.) change potassium 20 meq - to one tablet only when you take the  Lasix.  *If you need a refill on your cardiac medications before your next appointment, please call your pharmacy*   Lab Work: none If you have labs (blood work) drawn today and your tests are completely normal, you will receive your results only by: Marland Kitchen MyChart Message (if you have MyChart) OR . A paper copy in the mail If you have any lab test that is abnormal or we need to change your treatment, we will call you to review the results.   Testing/Procedures: none  Follow-Up: As scheduled with Dr. Radford Pax  Other Instructions      Signed, Angelena Form, PA-C  04/14/2020 2:50 PM    [facility] [place], [place], [place]  [address] Phone: [phone]; Fax: [phone]

## 2020-04-14 ENCOUNTER — Other Ambulatory Visit: Payer: Self-pay

## 2020-04-14 ENCOUNTER — Ambulatory Visit: Payer: Medicare Other | Admitting: Physician Assistant

## 2020-04-14 ENCOUNTER — Ambulatory Visit (INDEPENDENT_AMBULATORY_CARE_PROVIDER_SITE_OTHER): Payer: Medicare PPO | Admitting: Physician Assistant

## 2020-04-14 ENCOUNTER — Encounter: Payer: Self-pay | Admitting: Physician Assistant

## 2020-04-14 ENCOUNTER — Other Ambulatory Visit (HOSPITAL_COMMUNITY): Payer: Medicare PPO

## 2020-04-14 ENCOUNTER — Other Ambulatory Visit (HOSPITAL_COMMUNITY): Payer: Medicare Other

## 2020-04-14 VITALS — BP 106/50 | HR 62 | Ht 61.0 in | Wt 107.0 lb

## 2020-04-14 DIAGNOSIS — I10 Essential (primary) hypertension: Secondary | ICD-10-CM

## 2020-04-14 DIAGNOSIS — Z952 Presence of prosthetic heart valve: Secondary | ICD-10-CM

## 2020-04-14 DIAGNOSIS — I4892 Unspecified atrial flutter: Secondary | ICD-10-CM

## 2020-04-14 MED ORDER — POTASSIUM CHLORIDE CRYS ER 20 MEQ PO TBCR: 20.0000 meq | EXTENDED_RELEASE_TABLET | Freq: Every day | ORAL | 0 refills | Status: DC | PRN

## 2020-04-14 MED ORDER — FUROSEMIDE 40 MG PO TABS: 40.0000 mg | ORAL_TABLET | Freq: Every day | ORAL | 0 refills | Status: DC | PRN

## 2020-04-14 NOTE — Patient Instructions (Addendum)
Medication Instructions:  Your physician has recommended you make the following change in your medication:  1.) change Lasix (furosemide) 40 mg - to one tablet daily as needed. 2.) change potassium 20 meq - to one tablet only when you take the Lasix.  *If you need a refill on your cardiac medications before your next appointment, please call your pharmacy*   Lab Work: none If you have labs (blood work) drawn today and your tests are completely normal, you will receive your results only by: Marland Kitchen MyChart Message (if you have MyChart) OR . A paper copy in the mail If you have any lab test that is abnormal or we need to change your treatment, we will call you to review the results.   Testing/Procedures: none  Follow-Up: As scheduled with Dr. Radford Pax  Other Instructions

## 2020-04-16 DIAGNOSIS — H353211 Exudative age-related macular degeneration, right eye, with active choroidal neovascularization: Secondary | ICD-10-CM | POA: Diagnosis not present

## 2020-04-19 ENCOUNTER — Ambulatory Visit
Admission: RE | Admit: 2020-04-19 | Discharge: 2020-04-19 | Disposition: A | Discharge status: Home / Self Care | Payer: Medicare PPO | Source: Ambulatory Visit | Attending: Neurology | Admitting: Neurology

## 2020-04-19 ENCOUNTER — Other Ambulatory Visit: Payer: Self-pay

## 2020-04-19 DIAGNOSIS — R42 Dizziness and giddiness: Secondary | ICD-10-CM

## 2020-04-19 DIAGNOSIS — H5460 Unqualified visual loss, one eye, unspecified: Secondary | ICD-10-CM | POA: Diagnosis not present

## 2020-04-19 DIAGNOSIS — R519 Headache, unspecified: Secondary | ICD-10-CM

## 2020-04-19 MED ORDER — GADOBENATE DIMEGLUMINE 529 MG/ML IV SOLN
10.0000 mL | Freq: Once | INTRAVENOUS | Status: AC | PRN
  Administered 2020-04-19: 10 mL via INTRAVENOUS

## 2020-05-22 ENCOUNTER — Other Ambulatory Visit: Payer: Self-pay | Admitting: Internal Medicine

## 2020-05-24 DIAGNOSIS — H353211 Exudative age-related macular degeneration, right eye, with active choroidal neovascularization: Secondary | ICD-10-CM | POA: Diagnosis not present

## 2020-05-24 NOTE — Telephone Encounter (Signed)
Eliquis 2.5mg  refill request received. Patient is 85 years old, weight-48.5kg, Crea-1.73 on 7/28/201, Diagnosis-Afib, and last seen by Bonney Leitz on 04/14/2020. Dose is appropriate based on dosing criteria. Will send in refill to requested pharmacy.

## 2020-06-22 ENCOUNTER — Other Ambulatory Visit: Payer: Self-pay | Admitting: Physician Assistant

## 2020-06-23 DIAGNOSIS — Z79899 Other long term (current) drug therapy: Secondary | ICD-10-CM | POA: Diagnosis not present

## 2020-06-23 DIAGNOSIS — G459 Transient cerebral ischemic attack, unspecified: Secondary | ICD-10-CM | POA: Diagnosis not present

## 2020-06-23 DIAGNOSIS — M810 Age-related osteoporosis without current pathological fracture: Secondary | ICD-10-CM | POA: Diagnosis not present

## 2020-06-23 DIAGNOSIS — D561 Beta thalassemia: Secondary | ICD-10-CM | POA: Diagnosis not present

## 2020-06-23 DIAGNOSIS — Z681 Body mass index (BMI) 19 or less, adult: Secondary | ICD-10-CM | POA: Diagnosis not present

## 2020-06-23 DIAGNOSIS — M0579 Rheumatoid arthritis with rheumatoid factor of multiple sites without organ or systems involvement: Secondary | ICD-10-CM | POA: Diagnosis not present

## 2020-06-23 DIAGNOSIS — M2550 Pain in unspecified joint: Secondary | ICD-10-CM | POA: Diagnosis not present

## 2020-06-25 ENCOUNTER — Encounter: Payer: Self-pay | Admitting: Adult Health

## 2020-06-25 ENCOUNTER — Other Ambulatory Visit: Payer: Self-pay | Admitting: *Deleted

## 2020-06-25 ENCOUNTER — Telehealth: Payer: Self-pay | Admitting: *Deleted

## 2020-06-25 ENCOUNTER — Telehealth: Payer: Self-pay | Admitting: Hematology and Oncology

## 2020-06-25 DIAGNOSIS — D696 Thrombocytopenia, unspecified: Secondary | ICD-10-CM

## 2020-06-25 NOTE — Telephone Encounter (Signed)
Scheduled appts per sch msg. Called and spoke with patients daughter. Confirmed appts

## 2020-06-25 NOTE — Progress Notes (Signed)
Per MD if pt is symptomatic with Hgb of 8.1 pt will need to receive 2 units of PRBC's.  RN placed call to pt who states he is symptomatic and experiencing extreme fatigue.  Pt states he will not be able to come in tomorrow to receive blood but would be able to next week.  RN placed message to scheduling to schedule pt for lab and infusion (2 units of PRBC's) next week.  MD also advised pt to be seen in the clinic for f/u in one month with lab draw as well.

## 2020-06-25 NOTE — Progress Notes (Signed)
Per MD request, orders placed for 2 units of PRBC's for pt to receive on Monday 06/28/20 due to symptomatic anemia.

## 2020-06-25 NOTE — Telephone Encounter (Signed)
Received call from pt daughter [name] stating pt recently had lab work drawn by rheumatologist and was told pt is severely anemia and needs to follow up with hematologist.  RN will request copy of lab work from [facility] for further evaluation and recommendations.

## 2020-06-28 ENCOUNTER — Inpatient Hospital Stay: Payer: Medicare PPO

## 2020-06-28 ENCOUNTER — Telehealth: Payer: Self-pay | Admitting: Adult Health

## 2020-06-28 ENCOUNTER — Ambulatory Visit: Payer: Medicare PPO | Admitting: Adult Health

## 2020-06-28 ENCOUNTER — Inpatient Hospital Stay: Payer: Medicare PPO | Attending: Hematology and Oncology

## 2020-06-28 ENCOUNTER — Telehealth: Payer: Self-pay | Admitting: Hematology and Oncology

## 2020-06-28 ENCOUNTER — Other Ambulatory Visit: Payer: Self-pay

## 2020-06-28 DIAGNOSIS — D61818 Other pancytopenia: Secondary | ICD-10-CM | POA: Insufficient documentation

## 2020-06-28 DIAGNOSIS — D696 Thrombocytopenia, unspecified: Secondary | ICD-10-CM

## 2020-06-28 LAB — CBC WITH DIFFERENTIAL (CANCER CENTER ONLY)
Abs Immature Granulocytes: 0.05 10*3/uL (ref 0.00–0.07)
Basophils Absolute: 0 10*3/uL (ref 0.0–0.1)
Basophils Relative: 0 %
Eosinophils Absolute: 0.4 10*3/uL (ref 0.0–0.5)
Eosinophils Relative: 4 %
HCT: 23.8 % — ABNORMAL LOW (ref 39.0–52.0)
Hemoglobin: 7.4 g/dL — ABNORMAL LOW (ref 13.0–17.0)
Immature Granulocytes: 1 %
Lymphocytes Relative: 8 %
Lymphs Abs: 0.8 10*3/uL (ref 0.7–4.0)
MCH: 21.8 pg — ABNORMAL LOW (ref 26.0–34.0)
MCHC: 31.1 g/dL (ref 30.0–36.0)
MCV: 70.2 fL — ABNORMAL LOW (ref 80.0–100.0)
Monocytes Absolute: 0.7 10*3/uL (ref 0.1–1.0)
Monocytes Relative: 8 %
Neutro Abs: 7.3 10*3/uL (ref 1.7–7.7)
Neutrophils Relative %: 79 %
Platelet Count: 118 10*3/uL — ABNORMAL LOW (ref 150–400)
RBC: 3.39 MIL/uL — ABNORMAL LOW (ref 4.22–5.81)
RDW: 18 % — ABNORMAL HIGH (ref 11.5–15.5)
WBC Count: 9.2 10*3/uL (ref 4.0–10.5)
nRBC: 0.4 % — ABNORMAL HIGH (ref 0.0–0.2)

## 2020-06-28 LAB — PREPARE RBC (CROSSMATCH)

## 2020-06-28 LAB — SAMPLE TO BLOOD BANK

## 2020-06-28 MED ORDER — DIPHENHYDRAMINE HCL 25 MG PO CAPS
25.0000 mg | ORAL_CAPSULE | Freq: Once | ORAL | Status: AC
  Administered 2020-06-28: 25 mg via ORAL

## 2020-06-28 MED ORDER — ACETAMINOPHEN 325 MG PO TABS
650.0000 mg | ORAL_TABLET | Freq: Once | ORAL | Status: AC
  Administered 2020-06-28: 650 mg via ORAL

## 2020-06-28 MED ORDER — ACETAMINOPHEN 325 MG PO TABS
ORAL_TABLET | ORAL | Status: AC
  Filled 2020-06-28: qty 2

## 2020-06-28 MED ORDER — SODIUM CHLORIDE 0.9% IV SOLUTION
250.0000 mL | Freq: Once | INTRAVENOUS | Status: AC
  Administered 2020-06-28: 250 mL via INTRAVENOUS
  Filled 2020-06-28: qty 250

## 2020-06-28 MED ORDER — DIPHENHYDRAMINE HCL 25 MG PO CAPS
ORAL_CAPSULE | ORAL | Status: AC
  Filled 2020-06-28: qty 1

## 2020-06-28 NOTE — Telephone Encounter (Signed)
Scheduled appointment per 10/25 sch msg. Spoke to patient's wife who is aware of appointment date and time.

## 2020-06-28 NOTE — Progress Notes (Deleted)
[facility]    Provider:  Dr [name] Requesting Provider: Thayer Hudson Primary Care Provider:  Lavone Orn, MD  CC:  Right eye vision loss  HPI:   Today, 06/28/2020, Joshua Hudson returns for follow-up regarding transient monocular visual loss.  He has not had any reoccurring episodes but does report occasional blurriness and dizziness. MR brain, MRA neck and CUS all unremarkable. ESR and CRP negative.  Continue to follow with cardiology closely for hypotension and bradycardia.       Copied from initial consult visit with Dr. [name] on 02/09/2020  Joshua Hudson is a 85 y.o. male here as requested by Joshua Hudson for evaluation of stroke.  I reviewed Joshua Hudson's notes: Patient has past medical history of aortic stenosis status post prosthetic valve replacement, rheumatoid arthritis on long-term steroid use, occipital neuralgia, beta thalassemia,, degenerative joint disease, esophageal stricture, bronchiectasis, congestive heart failure class I, unequal pupil diameter, BPH, paroxysmal A. fib, abnormal gait, anemia, erectile dysfunction, severe malnutrition and pancytopenia.  Patient is on apixaban twice daily 2.5 mg.  Patient was last seen in the office and stroke was suspected, Dr. Deforest Hoyles was made aware, EKG showed sinus bradycardia, stat CT of the head was ordered and was negative for acute etiologies, we also discussed increasing Eliquis pending CT scan results, however Eliquis dosing was determined appropriate for age and weight, he underwent recent cardioversion in the hospital, presented with acute onset of vision loss in the right eye, he was evaluated by ophthalmology and cardiology, he was recommended to stop metoprolol due to bradycardia, he has been feeling fatigued, tired for several weeks, he was sitting and watching TV when his right eye suddenly lost vision, totally black in the right eye, no other symptoms at that time including slurred speech, lasted  for about 5 minutes and resolve spontaneously, since that time he had many episodes of feeling lightheaded like he was drunk, very dizzy and 2 falls due to dizziness, did not hit his head with either fall, appears to happen randomly, he has chronic hypotension blood pressures at home around 87/53 and heart rate has been persistently low.  Patient with daughter states 2 weeks ago he lost complete vision in the right eye, stayed like that for about 5 minutes but then it started to clear up, since then he is had continuous dizziness which is associated with nausea, blood pressure is running a lot lower than normal and the heart rate also going as low as 30s, cardiology is aware. He lost vision complete;y in his right eye, just went black, feeling dizzy right beforehand, lasted 5 minutes, then started clearing up, it is still blurry, he has stopped metoprolol but blood pressure is not better.  The right side doesn't feel right, feels different the face, arm and leg. Hasn't missed any eliquis. He is having headaches, on the right side of the head, he has had headaches the last few weeks. Happened the 28th of June and since then no worsening, he has had an echocardiogram, no neck pain, no jaw pain, no pain on chewing, no energy(not new). Daughter provides much information.  Reviewed notes, labs and imaging from outside physicians, which showed:  Personally reviewed CT of the head imaging and results: Showed mild diffuse atrophy small vessel disease (chronic microvascular ischemic changes), no demonstrable acute infarct, nothing acute.  Review of Systems: Patient complains of symptoms per HPI as well as the following symptoms vision loss, "stroke". Pertinent negatives and positives per HPI. All others  negative.   Social History   Socioeconomic History  . Marital status: Widowed    Spouse name: Not on file  . Number of children: 2  . Years of education: Not on file  . Highest education level: Not on file    Occupational History  . Occupation: Retired Market researcher at [employer]  . Smoking status: Former Smoker    Packs/day: 0.50    Years: 10.00    Pack years: 5.00    Types: Cigarettes    Start date: 01/16/1974    Quit date: 09/05/1983    Years since quitting: 36.8  . Smokeless tobacco: Former Systems developer    Types: Secondary school teacher  . Vaping Use: Never used  Substance and Sexual Activity  . Alcohol use: Not Currently    Alcohol/week: 0.0 standard drinks  . Drug use: No  . Sexual activity: Not on file  Other Topics Concern  . Not on file  Social History Narrative  . Not on file   Social Determinants of Health   Financial Resource Strain:   . Difficulty of Paying Living Expenses: Not on file  Food Insecurity:   . Worried About Charity fundraiser in the Last Year: Not on file  . Ran Out of Food in the Last Year: Not on file  Transportation Needs:   . Lack of Transportation (Medical): Not on file  . Lack of Transportation (Non-Medical): Not on file  Physical Activity:   . Days of Exercise per Week: Not on file  . Minutes of Exercise per Session: Not on file  Stress:   . Feeling of Stress : Not on file  Social Connections:   . Frequency of Communication with Friends and Family: Not on file  . Frequency of Social Gatherings with Friends and Family: Not on file  . Attends Religious Services: Not on file  . Active Member of Clubs or Organizations: Not on file  . Attends Archivist Meetings: Not on file  . Marital Status: Not on file  Intimate Partner Violence:   . Fear of Current or Ex-Partner: Not on file  . Emotionally Abused: Not on file  . Physically Abused: Not on file  . Sexually Abused: Not on file    Family History  Problem Relation Age of Onset  . Other Mother        NO HEALTH PROBLEMS  . Heart disease Father   . Arthritis Father   . Heart Problems Sister        RELATED TO A MVA  . Suicidality Brother   . Other Sister        [place]  .  Other Brother        2 [place]  . Other Daughter        2 [place]    Past Medical History:  Diagnosis Date  . Achalasia 06/12/2019   Noted on EGD  . Aortic insufficiency 03/24/2019   AI of bioprosthetic AVR severe 4 plus  . Chronic diastolic CHF (congestive heart failure) ([place]) 03/24/2019  . Colon polyps    s/p diverticular perforation requiring 2-stage repair  . Derangement of right shoulder joint    need replacing has no use of  . Diverticulosis   . DJD (degenerative joint disease), lumbosacral   . Dysphagia    eats soft food  . Esophageal stricture   . GERD (gastroesophageal reflux disease)   . Hemolytic anemia ([place])   .  History of blood transfusion given with 04-15-19 surgery  . History of blood transfusion 07/18/2019  . History of kidney stones    passed stones  . Hypertension   . Mitral regurgitation    moderate  . Occipital neuralgia   . Pancytopenia ([place])   . Postoperative atrial fibrillation ([place]) 05/10/2015  . Prosthetic valve dysfunction   . Rheumatoid arthritis ([place])    s/o long term steroids  shoulders and hands  . Rotator cuff arthropathy    right  . S/P aortic valve replacement with bioprosthetic valve 01/16/2008   15m Edwards Magna perimount bovine pericardial tissue valve, model 3000  . S/P valve-in-valve TAVR 04/15/2019   26 mm Edwards Sapien 3 Ultra transcatheter heart valve placed via percutaneous right transfemoral approach   . SBE (subacute bacterial endocarditis) prophylaxis candidate    for dental procedures  . Schatzki's ring 06/12/2019   Narrowing, Noted on EGD  . Severe aortic stenosis    S/P prosthetic valve replacement w 25 mm Edwards like science percardial tissue valve,[place] - 01/2008  . Thrombocytopenia ([facility]     Patient Active Problem List   Diagnosis Date Noted  . Monocular vision loss 03/23/2020  . New onset atrial flutter ([place] 09/22/2019  . Acute systolic (congestive) heart failure ([facility] 09/22/2019  .  Elevated troponin 09/22/2019  . Microcytic hypochromic anemia 09/22/2019  . S/P shoulder replacement, right 07/18/2019  . Thrombocytopenia ([facility] 05/20/2019  . Other pancytopenia ([facility] 05/06/2019  . Protein-calorie malnutrition, severe 04/30/2019  . FTT (failure to thrive) in adult 04/28/2019  . GERD (gastroesophageal reflux disease)   . Esophageal stricture   . History of esophageal stricture   . Odynophagia   . Hypertension   . Rheumatoid arthritis ([place]   . S/P valve-in-valve TAVR   . Prosthetic valve dysfunction   . Severe aortic regurgitation 04/02/2019  . Mitral regurgitation 05/16/2016  . Postoperative atrial fibrillation ([facility] 05/10/2015  . Hemolytic anemia ([facility] 02/25/2014  . Acute on chronic diastolic heart failure ([facility] 02/25/2014  . Dysphagia 02/25/2014  . S/P aortic valve replacement with bioprosthetic valve 01/16/2008    Past Surgical History:  Procedure Laterality Date  . A-FLUTTER ABLATION N/A 10/10/2019   Procedure: A-FLUTTER ABLATION;  Surgeon: TEvans Lance MD;  [facility];  Service: Cardiovascular;  Laterality: N/A;  . APPENDECTOMY    . BALLOON DILATION N/A 06/12/2019   Procedure: BALLOON DILATION;  Surgeon: KRonnette Juniper MD;  [facility];  Service: Gastroenterology;  Laterality: N/A;  . BOTOX INJECTION N/A 01/22/2018   Procedure: BOTOX INJECTION;  Surgeon: KRonnette Juniper MD;  [facility];  Service: Gastroenterology;  Laterality: N/A;  . CARDIAC CATHETERIZATION     09  . CARDIAC VALVE REPLACEMENT  01/2008   aortic valve replacement  . CARDIOVERSION N/A 09/25/2019   Procedure: CARDIOVERSION;  Surgeon: SJerline Pain MD;  [facility];  Service: Cardiovascular;  Laterality: N/A;  . CARDIOVERSION N/A 12/25/2019   Procedure: CARDIOVERSION;  Surgeon: CLelon Perla MD;  [facility];  Service: Cardiovascular;  Laterality: N/A;  . CATARACT EXTRACTION W/ INTRAOCULAR LENS  IMPLANT, BILATERAL    . COLON  RESECTION     diverticulitis   . CORONARY ANGIOPLASTY    . dental implants     permanent  . ESOPHAGEAL MANOMETRY N/A 11/07/2017   Procedure: ESOPHAGEAL MANOMETRY (EM);  Surgeon: KRonnette Juniper MD;  [facility];  Service: Gastroenterology;  Laterality: N/A;  . ESOPHAGOGASTRODUODENOSCOPY (EGD) WITH PROPOFOL N/A 01/22/2018  Procedure: ESOPHAGOGASTRODUODENOSCOPY (EGD) WITH PROPOFOL;  Surgeon: Ronnette Juniper, MD;  [facility];  Service: Gastroenterology;  Laterality: N/A;  . ESOPHAGOGASTRODUODENOSCOPY (EGD) WITH PROPOFOL N/A 04/30/2019   Procedure: ESOPHAGOGASTRODUODENOSCOPY (EGD) WITH PROPOFOL;  Surgeon: Laurence Spates, MD;  Location: [place];  Service: Endoscopy;  Laterality: N/A;  . ESOPHAGOGASTRODUODENOSCOPY (EGD) WITH PROPOFOL N/A 06/12/2019   Procedure: ESOPHAGOGASTRODUODENOSCOPY (EGD) WITH PROPOFOL;  Surgeon: Ronnette Juniper, MD;  [facility];  Service: Gastroenterology;  Laterality: N/A;  with botox injection  . EXCISIONAL TOTAL SHOULDER ARTHROPLASTY WITH ANTIBIOTIC SPACER Right 12/31/2018   Procedure: EXCISIONAL TOTAL SHOULDER ARTHROPLASTY WITH ANTIBIOTIC SPACER;  Surgeon: Netta Cedars, MD;  Location: [place];  Service: Orthopedics;  Laterality: Right;  . HERNIA REPAIR    . INTRAOPERATIVE TRANSTHORACIC ECHOCARDIOGRAM N/A 04/15/2019   Procedure: Intraoperative Transthoracic Echocardiogram;  Surgeon: Sherren Mocha, MD;  Location: [place];  Service: Open Heart Surgery;  Laterality: N/A;  . IRRIGATION AND DEBRIDEMENT SHOULDER Right 11/20/2018    IRRIGATION AND DEBRIDEMENT SHOULDER WITH POLY EXCHANGE (Right Shoulder)  . IRRIGATION AND DEBRIDEMENT SHOULDER Right 11/20/2018   Procedure: IRRIGATION AND DEBRIDEMENT SHOULDER WITH POLY EXCHANGE;  Surgeon: Netta Cedars, MD;  Location: [place];  Service: Orthopedics;  Laterality: Right;  . LUMBAR LAMINECTOMY     x 2  . REVERSE SHOULDER ARTHROPLASTY Right 03/01/2018   Procedure: RIGHT REVERSE SHOULDER ARTHROPLASTY;  Surgeon: Netta Cedars, MD;  Location: [place];  Service: Orthopedics;  Laterality: Right;  . REVERSE SHOULDER ARTHROPLASTY Right 07/18/2019   Procedure: REVERSE TOTAL SHOULDER ARTHROPLASTY and removal of antiobotic spacer;  Surgeon: Netta Cedars, MD;  [facility];  Service: Orthopedics;  Laterality: Right;  interscalene block  . REVERSE SHOULDER ARTHROPLASTY Right 08/06/2019   Procedure: Reduction of dislocated reverse total shoulder and poly exchange;  Surgeon: Netta Cedars, MD;  [facility];  Service: Orthopedics;  Laterality: Right;  need 1 hour  . RIGHT/LEFT HEART CATH AND CORONARY ANGIOGRAPHY N/A 04/02/2019   Procedure: RIGHT/LEFT HEART CATH AND CORONARY ANGIOGRAPHY;  Surgeon: Leonie Man, MD;  Location: [place] [facility];  Service: Cardiovascular;  Laterality: N/A;  . SAVORY DILATION N/A 01/22/2018   Procedure: SAVORY DILATION;  Surgeon: Ronnette Juniper, MD;  [facility];  Service: Gastroenterology;  Laterality: N/A;  . SAVORY DILATION N/A 04/30/2019   Procedure: SAVORY DILATION;  Surgeon: Laurence Spates, MD;  Location: [place];  Service: Endoscopy;  Laterality: N/A;  With fluro  . SHOULDER HEMI-ARTHROPLASTY Right 06/14/2018   Procedure: RIGHT  REVERSE TOTAL SHOULDER OPEN POLY EXCHANGE;  Surgeon: Netta Cedars, MD;  Location: [place];  Service: Orthopedics;  Laterality: Right;  . SUBMUCOSAL INJECTION  06/12/2019   Procedure: SUBMUCOSAL INJECTION;  Surgeon: Ronnette Juniper, MD;  [facility];  Service: Gastroenterology;;  . TEE WITHOUT CARDIOVERSION N/A 01/29/2014   Procedure: TRANSESOPHAGEAL ECHOCARDIOGRAM (TEE);  Surgeon: Sueanne Margarita, MD;  [facility];  Service: Cardiovascular;  Laterality: N/A;  . TEE WITHOUT CARDIOVERSION N/A 04/02/2019   Procedure: TRANSESOPHAGEAL ECHOCARDIOGRAM (TEE);  Surgeon: Josue Hector, MD;  [facility];  Service: Cardiovascular;  Laterality: N/A;  . TEE WITHOUT CARDIOVERSION N/A 09/25/2019   Procedure: TRANSESOPHAGEAL  ECHOCARDIOGRAM (TEE);  Surgeon: Jerline Pain, MD;  Location: [facility];  Service: Cardiovascular;  Laterality: N/A;  . TONSILLECTOMY    . TRANSCATHETER AORTIC VALVE REPLACEMENT, TRANSFEMORAL  04/15/2019  . TRANSCATHETER AORTIC VALVE REPLACEMENT, TRANSFEMORAL N/A 04/15/2019   Procedure: TRANSCATHETER AORTIC VALVE REPLACEMENT, TRANSFEMORAL with POST BALLOON DILATION;  Surgeon: Sherren Mocha, MD;  [facility];  Service: Open Heart Surgery;  Laterality: N/A;    Current Outpatient Medications  Medication Sig Dispense Refill  . amiodarone (PACERONE) 200 MG tablet Take 1 tablet (200 mg total) by mouth daily. 90 tablet 3  . ELIQUIS 2.5 MG TABS tablet TAKE 1 TABLET(2.5 MG) BY MOUTH TWICE DAILY 180 tablet 1  . finasteride (PROSCAR) 5 MG tablet Take 5 mg by mouth daily as needed (retention).     . folic acid (FOLVITE) 1 MG tablet Take 1 mg by mouth daily.    . furosemide (LASIX) 40 MG tablet Take 1 tablet (40 mg total) by mouth daily as needed. 90 tablet 3  . leflunomide (ARAVA) 20 MG tablet Take 20 mg by mouth daily.    . Multiple Vitamin (MULTIVITAMIN WITH MINERALS) TABS tablet Take 1 tablet by mouth daily.    . pantoprazole (PROTONIX) 40 MG tablet Take 40 mg by mouth daily before breakfast.     . Polyethyl Glycol-Propyl Glycol (LUBRICANT EYE DROPS) 0.4-0.3 % SOLN Place 1 drop into both eyes 3 (three) times daily as needed (dry/irritated eyes.).    Marland Kitchen potassium chloride SA (KLOR-CON) 20 MEQ tablet Take 1 tablet (20 mEq total) by mouth daily as needed. 60 tablet 0  . predniSONE (DELTASONE) 5 MG tablet Take 5 mg by mouth daily with breakfast.      No current facility-administered medications for this visit.    Allergies as of 06/28/2020  . (No Known Allergies)    Vitals: There were no vitals taken for this visit. Last Weight:  Wt Readings from Last 1 Encounters:  04/14/20 107 lb (48.5 kg)   Last Height:   Ht Readings from Last 1 Encounters:  04/14/20 '5\' 1"'  (1.549 m)     Physical  exam: Exam: Gen: NAD, conversant, frail and thin,              CV: RRR, +SEM. + right > Left Carotid Bruits. No peripheral edema, warm, nontender. Right temporal  Area non tender to palpation and equal temporal pulses without bounding.  Eyes: Conjunctivae clear without exudates or hemorrhage  Neuro: Detailed Neurologic Exam  Speech:    Speech is normal; fluent and spontaneous with normal comprehension.  Cognition:    The patient is oriented to person, place, and time;     recent and remote memory intact;     language fluent;     normal attention, concentration,     fund of knowledge Cranial Nerves:    The pupils are equal, round, and reactive to light. Could not visualize fundi. Visual fields are full to finger confrontation. Extraocular movements are intact. Trigeminal sensation is decreased right side of the face but muscles of mastication are normal. The face is symmetric. The palate elevates in the midline. Hearing intact. Voice is normal. Shoulder shrug is normal. The tongue has normal motion without fasciculations.   Coordination:    Normal finger to nose   Gait:    Can get up and ambulate independently but imbalanced and risk of fall, declines using walking aid  Motor Observation:    No asymmetry, no atrophy, left hand course action > resting tremor  Tone:    Normal muscle tone.    Posture:   erect    Strength:    Strength is 4-4+/5 in the upper and lower limbs, symmetrical, generalized weakness     Sensation: decreased right arm and right leg     Reflex Exam:  DTR's:    Deep  tendon reflexes in the lower extremities are absent, 1+ biceps bilaterally.   Toes:    The toes are downgoing bilaterally.   Clonus:    Clonus is absent.    Assessment/Plan:   85 y.o. male here as requested by Joshua Hudson for evaluation of stroke. Patient has past medical history of aortic stenosis status post prosthetic valve replacement, rheumatoid arthritis on long-term steroid  use, occipital neuralgia, beta thalassemia, degenerative joint disease, esophageal stricture, bronchiectasis, congestive heart failure class I, unequal pupil diameter, BPH, paroxysmal A. fib on Eliquis, abnormal gait, anemia, erectile dysfunction, severe malnutrition and pancytopenia. Episode of right eye vision loss, he has chronic hypotension blood pressures at home around 87/53 and heart rate has been persistently low.  Cardiology advised him to stop metoprolol and follow-up blood pressure BP is better but still with very low HRs.    Monocular vision loss DDX: Hypotension and ischemic injury to the right optic nerve due to decreased blood flow, Embolic stroke (need stroke wk/up) or Temporal Arteritis (reports new onset right temporal headache). Ongoing for close to a month and stable so I do not think there is an urgent need to start prednisone but I did discuss possible temporal artery biopsy and they decline right now will see what esr/crp shows but given his multiple chronic symptoms I suspect they may be elevated regardless.   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 I also advised the patient to eat a healthy diet with plenty of whole grains, cereals, fruits and vegetables, exercise regularly and maintain ideal body weight .   No  orders of the defined types were placed in this encounter.  No orders of the defined types were placed in this encounter.   Cc: Joshua Orn, MD,  Joshua Orn, MD, [facility]  I spent *** minutes of face-to-face and non-face-to-face time with patient.  This included previsit chart review, lab review, study review, order entry, electronic health record documentation, patient education  Frann Rider, [facility]  [facility] [address] [place], [place] [address]  Phone [phone] Fax [phone] Note: This document was prepared with digital dictation and possible smart phrase technology. Any transcriptional errors that result from this process are unintentional.

## 2020-06-28 NOTE — Patient Instructions (Signed)

## 2020-06-28 NOTE — Telephone Encounter (Signed)
Sent MyChart message to let pt know date and time of rescheduled appt

## 2020-06-29 DIAGNOSIS — H353211 Exudative age-related macular degeneration, right eye, with active choroidal neovascularization: Secondary | ICD-10-CM | POA: Diagnosis not present

## 2020-06-29 LAB — TYPE AND SCREEN
ABO/RH(D): A POS
Antibody Screen: NEGATIVE
Unit division: 0
Unit division: 0

## 2020-06-29 LAB — BPAM RBC
Blood Product Expiration Date: 202111162359
Blood Product Expiration Date: 202111172359
ISSUE DATE / TIME: 202110250948
ISSUE DATE / TIME: 202110250948
Unit Type and Rh: 6200
Unit Type and Rh: 6200

## 2020-07-01 ENCOUNTER — Encounter: Payer: Self-pay | Admitting: Hematology and Oncology

## 2020-07-06 ENCOUNTER — Ambulatory Visit: Payer: Medicare PPO | Admitting: Adult Health

## 2020-07-06 ENCOUNTER — Other Ambulatory Visit: Payer: Self-pay

## 2020-07-06 ENCOUNTER — Encounter: Payer: Self-pay | Admitting: Adult Health

## 2020-07-06 VITALS — BP 115/68 | HR 57 | Ht 60.0 in | Wt 108.0 lb

## 2020-07-06 DIAGNOSIS — H5460 Unqualified visual loss, one eye, unspecified: Secondary | ICD-10-CM | POA: Diagnosis not present

## 2020-07-06 DIAGNOSIS — G453 Amaurosis fugax: Secondary | ICD-10-CM | POA: Diagnosis not present

## 2020-07-06 NOTE — Patient Instructions (Addendum)
Your Plan:  Continue to follow with cardiology for blood pressure and heart rate management  Continue to monitor blood pressure at home  Continue to follow with your eye doctor regularly as scheduled   Follow up on a as needed basis      Thank you for coming to see [place] at [facility]. I hope we have been able to provide you high quality care today.  You may receive a patient satisfaction survey over the next few weeks. We would appreciate your feedback and comments so that we may continue to improve ourselves and the health of our patients.    Amaurosis Fugax Amaurosis fugax, also called transient visual loss, is a condition in which a person loses sight in one eye or, rarely, both eyes for a short time. The vision loss in the affected eye may be total or partial. The vision loss usually lasts for only a few seconds or minutes before sight returns to normal. In some cases, vision loss may last for several hours. This condition is typically caused by interruption of blood flow in the artery that supplies blood to the retina. The retina is the part of the eye that contains the nerves needed for sight. This condition can be a warning sign that a stroke may happen, either in the eye or in the brain. A stroke can result in permanent vision loss or loss of other body functions. What are the causes? This condition is caused by a loss or interruption of blood flow to the retinal artery. Causes for the change in blood flow include:  A buildup of cholesterol and fats, or plaque, in the artery (atherosclerosis). If any plaque breaks off and gets into the bloodstream, it can travel to other blood vessels, such as the retinal artery.  Diseases of the heart valves.  Certain blood conditions, such as sickle cell anemia, leukemia, and blood clotting (coagulation) disorders.  Inflammation of the arteries (vasculitis).  A fast or irregular heartbeat, such as atrial  fibrillation.  Family history of stroke. What increases the risk? The following factors may make you more likely to develop this condition:  Use of any tobacco products, including cigarettes, chewing tobacco, or electronic cigarettes.  Poorly controlled diabetes.  Conditions that can lead to diseases of the heart and blood vessels (cardiovascular diseases), such as: ? High blood pressure (hypertension). ? High cholesterol.  Drinking too much alcohol regularly.  Use of drugs, especially cocaine.  Age. The risk increases with age. What are the signs or symptoms? The main symptom of this condition is painless, sudden loss of vision in one eye. The vision loss often starts at the top and moves down, as if a curtain is being pulled down over your eye. This is usually followed by a quick return of vision. However, symptoms may last for several hours. It is important to seek medical care right away even if your symptoms go away. How is this diagnosed? This condition is diagnosed by:  Medical history and physical exam.  Eye exam, including dilating drops and looking at the back of your eyes.  Carotid ultrasound. This checks for plaque in the carotid arteries in your neck.  Magnetic resonance angiography (MRA). This checks the carotid artery and the branches that supply the brain. MRA looks for areas of blockage or disease. You may also have other tests, including:  Blood tests.  Electrocardiogram (ECG) to check your heart rhythm.  Echocardiogram (ECHO) to check your heart function. How is this  treated? Emergency treatment for this condition may involve massaging the eyeball or using certain breathing techniques to remove or ease the blockage of the retinal artery. You may also get an emergency referral to a clinic or medical center that treats strokes. Other treatments focus on reducing your risk of having a stroke in the future. These may include:  Medicines, such as medicines to  manage high blood pressure, diabetes, or cholesterol, or to thin the blood.  Surgical procedures, such as: ? Carotid endarterectomy to remove plaque from the carotid artery. ? Carotid angioplasty and placement of a small, mesh tube (stenting) to open the blocked part of the artery.  Lifestyle changes, such as stopping tobacco use, changing your diet, and getting enough exercise. Follow these instructions at home: Medicines  Take over-the-counter and prescription medicines only as told by your health care provider.  If you are taking blood thinners: ? Talk with your health care provider before you take any medicines that contain aspirin or NSAIDs, such as ibuprofen. These medicines increase your risk for dangerous bleeding. ? Take your medicine exactly as told, at the same time every day. ? Avoid activities that could cause injury or bruising, and follow instructions about how to prevent falls. ? Wear a medical alert bracelet or carry a card that lists what medicines you take. Eating and drinking   Eat a diet that includes five or more servings of fruits and vegetables each day. This may reduce the risk of stroke.  Certain diets may help with high blood pressure, high cholesterol, diabetes, or obesity. These include: ? A diet that is low in salt (sodium) to manage high blood pressure. ? A high-fiber diet that is low in saturated fat, trans fat, and cholesterol to control cholesterol levels. ? A low-carbohydrate, low-sugar diet to manage diabetes. ? A reduced-calorie diet that is low in sodium, saturated fat, trans fat, and cholesterol to manage obesity. Lifestyle   Maintain a healthy weight.  Stay physically active. Try to get at least 30 minutes of activity on most or all days.  Do not use any products that contain nicotine or tobacco, such as cigarettes, e-cigarettes, and chewing tobacco. If you need help quitting, ask your health care provider.  Do not misuse drugs. General  instructions  Do not drink alcohol if: ? Your health care provider tells you not to drink. ? You are pregnant, may be pregnant, or are planning to become pregnant.  If you drink alcohol: ? Limit how much you use to:  0-1 drink a day for women.  0-2 drinks a day for men. ? Be aware of how much alcohol is in your drink. In the [place] one drink equals one 12 oz bottle of beer (355 mL), one 5 oz glass of wine (148 mL), or one 1 oz glass of hard liquor (44 mL).  Keep all follow-up visits as told by your health care provider. This is important. Contact a health care provider if:  You lose vision in one eye or both eyes. Get help right away if:   You have chest pain or an irregular heartbeat.  You have any symptoms of a stroke. "BE FAST" is an easy way to remember the main warning signs of a stroke. ? B - Balance. Signs are dizziness, sudden trouble walking, or loss of balance. ? E - Eyes. Signs are trouble seeing or a sudden change in vision. ? F - Face. Signs are sudden weakness or numbness of the face, or the  face or eyelid drooping on one side. ? A - Arms. Signs are weakness or numbness in an arm. This happens suddenly and usually on one side of the body. ? S - Speech. Signs are sudden trouble speaking, slurred speech, or trouble understanding what people say. ? T - Time. Time to call emergency services. Write down what time symptoms started.  You have other signs of a stroke, such as: ? A sudden, severe headache with no known cause. ? Nausea or vomiting. ? Seizure. These symptoms may represent a serious problem that is an emergency. Do not wait to see if the symptoms will go away. Get medical help right away. Call your local emergency services (911 in the [place] Do not drive yourself to the hospital. Summary  Amaurosis fugax is a condition in which you lose sight for a short time.  This condition can be a warning sign that a stroke may happen. A stroke can result in permanent  vision loss or loss of other body functions.  Seek medical care right away even if your symptoms go away. This information is not intended to replace advice given to you by your health care provider. Make sure you discuss any questions you have with your health care provider. Document Revised: 03/05/2019 Document Reviewed: 03/05/2019 [employer] Patient Education  [place].

## 2020-07-06 NOTE — Progress Notes (Addendum)
[facility]    Provider:  Dr [name] Requesting Provider: Thayer Ohm Primary Care Provider:  Lavone Orn, MD  CC:  Right eye vision loss  HPI:   Today, 06/28/2020, [name] returns for follow-up regarding transient monocular visual loss.  He is accompanied by his daughter.  He has not had any reoccurring episodes but does report occasional blurriness and dizziness consistently with hypotension.  Last Monday, 06/28/2020, received 2 units PRBC's for symptomatic anemia and thrombocytopenia and has not experienced any additional dizziness, blurred vision or hypotensive episodes.  Routinely monitors blood pressure at home with blood pressure today 115/68 which has been his normal at home recently.  He has been following closely with cardiology for history of HTN and episodes of orthostasis, PAF on Eliquis and amiodarone, chronic combined systolic diastolic CHF and s/p TAVR 04/2019.  He also routinely follows with ophthalmology receiving injections in right eye for macular degeneration every 5 weeks.  Underwent full stroke work-up for TMVL largely unremarkable including MRI, MRA neck, MR orbits, CUS and lab work.   MR brain w/wo 04/19/2020 IMPRESSION 1.  Mild generalized cortical atrophy 2. T2/flair hyperintense foci predominantly in the deep white matter with small confluences in the periatrial white matter.  This is most consistent with mild chronic microvascular hemic change, related to aging 3.  There are no acute findings and there is a normal enhancement pattern  MRA neck w/wo 04/19/2020 IMPRESSION This is a normal MR angiogram of the intracranial arteries  MR orbits w/wo 04/19/2020 IMPRESSION 1.  Bilateral lens replacements 2.  Orbits are otherwise normal 3.  Minimal chronic sinusitis and some of the ethmoid air cells and left frontal sinus 4.  No acute findings and normal enhancement pattern  VAS US CAROTID DUPLEX BILATERAL 03/30/2020 IMPRESSION Right  carotid: The extracranial vessels were near normal with only minimal wall thickening and plaque Left carotid: Extracranial vessels were near normal with only minimal wall thickening or plaque Vertebrals: Bilateral vertebral arteries demonstrate antegrade flow Subclavians: Normal flow hemodynamics were seen in bilateral subclavian arteries      Copied from initial consult visit with Dr. [name] on 02/09/2020  Joshua Hudson is a 85 y.o. male here as requested by Thayer Ohm for evaluation of stroke.  I reviewed Olivia Clelland's notes: Patient has past medical history of aortic stenosis status post prosthetic valve replacement, rheumatoid arthritis on long-term steroid use, occipital neuralgia, beta thalassemia,, degenerative joint disease, esophageal stricture, bronchiectasis, congestive heart failure class I, unequal pupil diameter, BPH, paroxysmal A. fib, abnormal gait, anemia, erectile dysfunction, severe malnutrition and pancytopenia.  Patient is on apixaban twice daily 2.5 mg.  Patient was last seen in the office and stroke was suspected, Dr. Deforest Hoyles was made aware, EKG showed sinus bradycardia, stat CT of the head was ordered and was negative for acute etiologies, we also discussed increasing Eliquis pending CT scan results, however Eliquis dosing was determined appropriate for age and weight, he underwent recent cardioversion in the hospital, presented with acute onset of vision loss in the right eye, he was evaluated by ophthalmology and cardiology, he was recommended to stop metoprolol due to bradycardia, he has been feeling fatigued, tired for several weeks, he was sitting and watching TV when his right eye suddenly lost vision, totally black in the right eye, no other symptoms at that time including slurred speech, lasted for about 5 minutes and resolve spontaneously, since that time he had many episodes of feeling lightheaded like he was drunk, very dizzy  and 2 falls due to dizziness, did not  hit his head with either fall, appears to happen randomly, he has chronic hypotension blood pressures at home around 87/53 and heart rate has been persistently low.  Patient with daughter states 2 weeks ago he lost complete vision in the right eye, stayed like that for about 5 minutes but then it started to clear up, since then he is had continuous dizziness which is associated with nausea, blood pressure is running a lot lower than normal and the heart rate also going as low as 30s, cardiology is aware. He lost vision complete;y in his right eye, just went black, feeling dizzy right beforehand, lasted 5 minutes, then started clearing up, it is still blurry, he has stopped metoprolol but blood pressure is not better.  The right side doesn't feel right, feels different the face, arm and leg. Hasn't missed any eliquis. He is having headaches, on the right side of the head, he has had headaches the last few weeks. Happened the 28th of June and since then no worsening, he has had an echocardiogram, no neck pain, no jaw pain, no pain on chewing, no energy(not new). Daughter provides much information.  Reviewed notes, labs and imaging from outside physicians, which showed:  Personally reviewed CT of the head imaging and results: Showed mild diffuse atrophy small vessel disease (chronic microvascular ischemic changes), no demonstrable acute infarct, nothing acute.  Review of Systems: Patient complains of symptoms per HPI as well as the following symptoms vision loss, dizziness, blurred vision.  Pertinent negatives and positives per HPI. All others negative.   Social History   Socioeconomic History  . Marital status: Widowed    Spouse name: Not on file  . Number of children: 2  . Years of education: Not on file  . Highest education level: Not on file  Occupational History  . Occupation: Retired Market researcher at [employer]  . Smoking status: Former Smoker    Packs/day: 0.50    Years: 10.00    Pack  years: 5.00    Types: Cigarettes    Start date: 01/16/1974    Quit date: 09/05/1983    Years since quitting: 36.8  . Smokeless tobacco: Former Systems developer    Types: Secondary school teacher  . Vaping Use: Never used  Substance and Sexual Activity  . Alcohol use: Not Currently    Alcohol/week: 0.0 standard drinks  . Drug use: No  . Sexual activity: Not on file  Other Topics Concern  . Not on file  Social History Narrative  . Not on file   Social Determinants of Health   Financial Resource Strain:   . Difficulty of Paying Living Expenses: Not on file  Food Insecurity:   . Worried About Charity fundraiser in the Last Year: Not on file  . Ran Out of Food in the Last Year: Not on file  Transportation Needs:   . Lack of Transportation (Medical): Not on file  . Lack of Transportation (Non-Medical): Not on file  Physical Activity:   . Days of Exercise per Week: Not on file  . Minutes of Exercise per Session: Not on file  Stress:   . Feeling of Stress : Not on file  Social Connections:   . Frequency of Communication with Friends and Family: Not on file  . Frequency of Social Gatherings with Friends and Family: Not on file  . Attends Religious Services: Not on file  . Active Member of Clubs  or Organizations: Not on file  . Attends Archivist Meetings: Not on file  . Marital Status: Not on file  Intimate Partner Violence:   . Fear of Current or Ex-Partner: Not on file  . Emotionally Abused: Not on file  . Physically Abused: Not on file  . Sexually Abused: Not on file    Family History  Problem Relation Age of Onset  . Other Mother        NO HEALTH PROBLEMS  . Heart disease Father   . Arthritis Father   . Heart Problems Sister        RELATED TO A MVA  . Suicidality Brother   . Other Sister        [place]  . Other Brother        2 [place]  . Other Daughter        2 [place]    Past Medical History:  Diagnosis Date  . Achalasia  06/12/2019   Noted on EGD  . Aortic insufficiency 03/24/2019   AI of bioprosthetic AVR severe 4 plus  . Chronic diastolic CHF (congestive heart failure) ([place]) 03/24/2019  . Colon polyps    s/p diverticular perforation requiring 2-stage repair  . Derangement of right shoulder joint    need replacing has no use of  . Diverticulosis   . DJD (degenerative joint disease), lumbosacral   . Dysphagia    eats soft food  . Esophageal stricture   . GERD (gastroesophageal reflux disease)   . Hemolytic anemia ([place])   . History of blood transfusion given with 04-15-19 surgery  . History of blood transfusion 07/18/2019  . History of kidney stones    passed stones  . Hypertension   . Mitral regurgitation    moderate  . Occipital neuralgia   . Pancytopenia ([place])   . Postoperative atrial fibrillation ([place]) 05/10/2015  . Prosthetic valve dysfunction   . Rheumatoid arthritis ([place])    s/o long term steroids  shoulders and hands  . Rotator cuff arthropathy    right  . S/P aortic valve replacement with bioprosthetic valve 01/16/2008   66mm Edwards Magna perimount bovine pericardial tissue valve, model 3000  . S/P valve-in-valve TAVR 04/15/2019   26 mm Edwards Sapien 3 Ultra transcatheter heart valve placed via percutaneous right transfemoral approach   . SBE (subacute bacterial endocarditis) prophylaxis candidate    for dental procedures  . Schatzki's ring 06/12/2019   Narrowing, Noted on EGD  . Severe aortic stenosis    S/P prosthetic valve replacement w 25 mm Edwards like science percardial tissue valve,[place] - 01/2008  . Thrombocytopenia [facility])     Patient Active Problem List   Diagnosis Date Noted  . Monocular vision loss 03/23/2020  . New onset atrial flutter ([place]) 09/22/2019  . Acute systolic (congestive) heart failure ([place]) 09/22/2019  . Elevated troponin 09/22/2019  . Microcytic hypochromic anemia 09/22/2019  . S/P shoulder replacement, right 07/18/2019  . Thrombocytopenia ([place])  05/20/2019  . Other pancytopenia ([place]) 05/06/2019  . Protein-calorie malnutrition, severe 04/30/2019  . FTT (failure to thrive) in adult 04/28/2019  . GERD (gastroesophageal reflux disease)   . Esophageal stricture   . History of esophageal stricture   . Odynophagia   . Hypertension   . Rheumatoid arthritis ([place])   . S/P valve-in-valve TAVR   . Prosthetic valve dysfunction   . Severe aortic regurgitation 04/02/2019  . Mitral regurgitation 05/16/2016  . Postoperative atrial  fibrillation ([place]) 05/10/2015  . Hemolytic anemia ([place]) 02/25/2014  . Acute on chronic diastolic heart failure ([place]) 02/25/2014  . Dysphagia 02/25/2014  . S/P aortic valve replacement with bioprosthetic valve 01/16/2008    Past Surgical History:  Procedure Laterality Date  . A-FLUTTER ABLATION N/A 10/10/2019   Procedure: A-FLUTTER ABLATION;  Surgeon: Evans Lance, MD;  Location: [place] [facility];  Service: Cardiovascular;  Laterality: N/A;  . APPENDECTOMY    . BALLOON DILATION N/A 06/12/2019   Procedure: BALLOON DILATION;  Surgeon: Ronnette Juniper, MD;  Location: [facility];  Service: Gastroenterology;  Laterality: N/A;  . BOTOX INJECTION N/A 01/22/2018   Procedure: BOTOX INJECTION;  Surgeon: Ronnette Juniper, MD;  Location: [facility];  Service: Gastroenterology;  Laterality: N/A;  . CARDIAC CATHETERIZATION     09  . CARDIAC VALVE REPLACEMENT  01/2008   aortic valve replacement  . CARDIOVERSION N/A 09/25/2019   Procedure: CARDIOVERSION;  Surgeon: Jerline Pain, MD;  Location: [facility] END[facility]COPY;  Service: Cardiovascular;  Laterality: N/A;  . CARDIOVERSION N/A 12/25/2019   Procedure: CARDIOVERSION;  Surgeon: Lelon Perla, MD;  Location: [facility] ENDOSCOPY;  Service: Cardiovascular;  Laterality: N/A;  . CATARACT EXTRACTION W/ INTRAOCULAR LENS  IMPLANT, BILATERAL    . COLON RESECTION     diverticulitis   . CORONARY ANGIOPLASTY    . dental implants     permanent  . ESOPHAGEAL MANOMETRY N/A 11/07/2017   Procedure:  ESOPHAGEAL MANOMETRY (EM);  Surgeon: Ronnette Juniper, MD;  [facility];  Service: Gastroenterology;  Laterality: N/A;  . ESOPHAGOGASTRODUODENOSCOPY (EGD) WITH PROPOFOL N/A 01/22/2018   Procedure: ESOPHAGOGASTRODUODENOSCOPY (EGD) WITH PROPOFOL;  Surgeon: Ronnette Juniper, MD;  [facility];  Service: Gastroenterology;  Laterality: N/A;  . ESOPHAGOGASTRODUODENOSCOPY (EGD) WITH PROPOFOL N/A 04/30/2019   Procedure: ESOPHAGOGASTRODUODENOSCOPY (EGD) WITH PROPOFOL;  Surgeon: Laurence Spates, MD;  Location: [place];  Service: Endoscopy;  Laterality: N/A;  . ESOPHAGOGASTRODUODENOSCOPY (EGD) WITH PROPOFOL N/A 06/12/2019   Procedure: ESOPHAGOGASTRODUODENOSCOPY (EGD) WITH PROPOFOL;  Surgeon: Ronnette Juniper, MD;  [facility];  Service: Gastroenterology;  Laterality: N/A;  with botox injection  . EXCISIONAL TOTAL SHOULDER ARTHROPLASTY WITH ANTIBIOTIC SPACER Right 12/31/2018   Procedure: EXCISIONAL TOTAL SHOULDER ARTHROPLASTY WITH ANTIBIOTIC SPACER;  Surgeon: Netta Cedars, MD;  Location: [place];  Service: Orthopedics;  Laterality: Right;  . HERNIA REPAIR    . INTRAOPERATIVE TRANSTHORACIC ECHOCARDIOGRAM N/A 04/15/2019   Procedure: Intraoperative Transthoracic Echocardiogram;  Surgeon: Sherren Mocha, MD;  Location: [place];  Service: Open Heart Surgery;  Laterality: N/A;  . IRRIGATION AND DEBRIDEMENT SHOULDER Right 11/20/2018    IRRIGATION AND DEBRIDEMENT SHOULDER WITH POLY EXCHANGE (Right Shoulder)  . IRRIGATION AND DEBRIDEMENT SHOULDER Right 11/20/2018   Procedure: IRRIGATION AND DEBRIDEMENT SHOULDER WITH POLY EXCHANGE;  Surgeon: Netta Cedars, MD;  Location: [place];  Service: Orthopedics;  Laterality: Right;  . LUMBAR LAMINECTOMY     x 2  . REVERSE SHOULDER ARTHROPLASTY Right 03/01/2018   Procedure: RIGHT REVERSE SHOULDER ARTHROPLASTY;  Surgeon: Netta Cedars, MD;  Location: [place];  Service: Orthopedics;  Laterality: Right;  . REVERSE SHOULDER ARTHROPLASTY Right 07/18/2019   Procedure: REVERSE  TOTAL SHOULDER ARTHROPLASTY and removal of antiobotic spacer;  Surgeon: Netta Cedars, MD;  [facility];  Service: Orthopedics;  Laterality: Right;  interscalene block  . REVERSE SHOULDER ARTHROPLASTY Right 08/06/2019   Procedure: Reduction of dislocated reverse total shoulder and poly exchange;  Surgeon: Netta Cedars, MD;  [facility]: [facility];  Service: Orthopedics;  Laterality: Right;  need 1 hour  .  RIGHT/LEFT HEART CATH AND CORONARY ANGIOGRAPHY N/A 04/02/2019   Procedure: RIGHT/LEFT HEART CATH AND CORONARY ANGIOGRAPHY;  Surgeon: Leonie Man, MD;  Location: [place] [facility];  Service: Cardiovascular;  Laterality: N/A;  . SAVORY DILATION N/A 01/22/2018   Procedure: SAVORY DILATION;  Surgeon: Ronnette Juniper, MD;  [facility];  Service: Gastroenterology;  Laterality: N/A;  . SAVORY DILATION N/A 04/30/2019   Procedure: SAVORY DILATION;  Surgeon: Laurence Spates, MD;  Location: [place];  Service: Endoscopy;  Laterality: N/A;  With fluro  . SHOULDER HEMI-ARTHROPLASTY Right 06/14/2018   Procedure: RIGHT  REVERSE TOTAL SHOULDER OPEN POLY EXCHANGE;  Surgeon: Netta Cedars, MD;  Location: [place];  Service: Orthopedics;  Laterality: Right;  . SUBMUCOSAL INJECTION  06/12/2019   Procedure: SUBMUCOSAL INJECTION;  Surgeon: Ronnette Juniper, MD;  [facility];  Service: Gastroenterology;;  . TEE WITHOUT CARDIOVERSION N/A 01/29/2014   Procedure: TRANSESOPHAGEAL ECHOCARDIOGRAM (TEE);  Surgeon: Sueanne Margarita, MD;  Location[facility];  Service: Cardiovascular;  Laterality: N/A;  . TEE WITHOUT CARDIOVERSION N/A 04/02/2019   Procedure: TRANSESOPHAGEAL ECHOCARDIOGRAM (TEE);  Surgeon: Josue Hector, MD;  Location: [facility] ENDOSCOPY;  Service: Cardiovascular;  Laterality: N/A;  . TEE WITHOUT CARDIOVERSION N/A 09/25/2019   Procedure: TRANSESOPHAGEAL ECHOCARDIOGRAM (TEE);  Surgeon: Jerline Pain, MD;  Location: [facility]COPY;  Service: Cardiovascular;  Laterality: N/A;  . TONSILLECTOMY    .  TRANSCATHETER AORTIC VALVE REPLACEMENT, TRANSFEMORAL  04/15/2019  . TRANSCATHETER AORTIC VALVE REPLACEMENT, TRANSFEMORAL N/A 04/15/2019   Procedure: TRANSCATHETER AORTIC VALVE REPLACEMENT, TRANSFEMORAL with POST BALLOON DILATION;  Surgeon: Sherren Mocha, MD;  Location: [place];  Service: Open Heart Surgery;  Laterality: N/A;    Current Outpatient Medications  Medication Sig Dispense Refill  . amiodarone (PACERONE) 200 MG tablet Take 1 tablet (200 mg total) by mouth daily. 90 tablet 3  . ELIQUIS 2.5 MG TABS tablet TAKE 1 TABLET(2.5 MG) BY MOUTH TWICE DAILY 180 tablet 1  . finasteride (PROSCAR) 5 MG tablet Take 5 mg by mouth daily as needed (retention).     . folic acid (FOLVITE) 1 MG tablet Take 1 mg by mouth daily.    . furosemide (LASIX) 40 MG tablet Take 1 tablet (40 mg total) by mouth daily as needed. 90 tablet 3  . leflunomide (ARAVA) 20 MG tablet Take 20 mg by mouth daily.    . Multiple Vitamin (MULTIVITAMIN WITH MINERALS) TABS tablet Take 1 tablet by mouth daily.    . pantoprazole (PROTONIX) 40 MG tablet Take 40 mg by mouth daily before breakfast.     . Polyethyl Glycol-Propyl Glycol (LUBRICANT EYE DROPS) 0.4-0.3 % SOLN Place 1 drop into both eyes 3 (three) times daily as needed (dry/irritated eyes.).    Marland Kitchen potassium chloride SA (KLOR-CON) 20 MEQ tablet Take 1 tablet (20 mEq total) by mouth daily as needed. 60 tablet 0  . predniSONE (DELTASONE) 5 MG tablet Take 5 mg by mouth daily with breakfast.      No current facility-administered medications for this visit.    Allergies as of 07/06/2020  . (No Known Allergies)    Vitals: Today's Vitals   07/06/20 0814  BP: 115/68  Pulse: (!) 57  Weight: 108 lb (49 kg)  Height: 5' (1.524 m)   Body mass index is 21.09 kg/m.  Physical exam: Exam: Gen: NAD, conversant, frail and thin,              CV: RRR, No peripheral edema, warm, nontender.  Eyes: Conjunctivae clear without exudates  or hemorrhage  Neuro: Detailed Neurologic  Exam  Speech:    Speech is normal; fluent and spontaneous with normal comprehension.  Cognition:    The patient is oriented to person, place, and time;     recent and remote memory intact;     language and speech fluent;     normal attention, concentration, and fund of knowledge Cranial Nerves:    The pupils are equal, round, and reactive to light. Could not visualize fundi. Visual fields are full to finger confrontation. Extraocular movements are intact. The face is symmetric. The palate elevates in the midline. Hearing intact. Voice is normal. Shoulder shrug is normal. The tongue has normal motion without fasciculations.   Coordination:    Normal finger to nose and heel-to-shin  Gait:    Stands from seated position independently.  Stance normal.  Gait demonstrates normal stride length and balance without assistive device.  Difficulty performing tandem walk or heel toe.  Romberg negative.  Motor Observation:    No asymmetry, no atrophy, left hand course action > resting tremor  Tone:    Normal muscle tone.      Strength:    Equal strength and tone throughout all tested extremities     Sensation: Intact throughout all extremities     Reflex Exam:  DTR's:    Deep tendon reflexes in the lower extremities are absent, 1+ biceps bilaterally.   Toes:    The toes are downgoing bilaterally.   Clonus:    Clonus is absent.    Assessment/Plan:   85 y.o. male here as requested by Thayer Ohm for evaluation of stroke. Patient has past medical history of aortic stenosis s/p TAVR 04/2019, rheumatoid arthritis on long-term steroid use, occipital neuralgia, beta thalassemia, anemia of chronic disease, thrombocytopenia, degenerative joint disease, esophageal stricture, bronchiectasis, HTN, congestive heart failure class I, BPH, paroxysmal A. fib on Eliquis, macular degeneration, and bilateral lens replacement.    TMVL OD: Stroke work-up unremarkable.  ERS and CRP negative.  Likely ischemic  in etiology with multiple risk factors including atrial fibrillation, thrombocytopenia, anemia, cardiac disease and episodes of hypotension.  He has not had any additional visual loss episodes.  Occasional blurred vision with dizziness in setting of hypotension recently improving after receiving 2 units PRBC's.  Advised to continue to follow with cardiology and ophthalmology routinely.  No further work-up indicated at this time.  Advised to continue Eliquis for secondary stroke prevention and history of atrial fibrillation.  Continue to monitor blood pressure at home with avoidance of hypotension.  Discussed with her secondary stroke prevention measures.   Advised to follow-up on an as-needed basis   CC:  [facility] provider: Dr. Domingo Cocking, Jenny Reichmann, MD     I spent 25 minutes of face-to-face and non-face-to-face time with patient and daughter.  This included previsit chart review, lab review, study review, order entry, electronic health record documentation, patient education and discussion regarding transient monocular visual loss and possible etiologies, ongoing secondary stroke prevention measures and managing stroke risk factors and answered all questions to patient and daughter satisfaction   Frann Rider, AGNP-BC  [facility] [address] [place], [place] [address]  Phone [phone] Fax [phone] Note: This document was prepared with digital dictation and possible smart phrase technology. Any transcriptional errors that result from this process are unintentional.  Made any corrections needed, and agree with history, physical, neuro exam,assessment and plan as stated.     Sarina Ill, MD [facility]

## 2020-07-25 NOTE — Progress Notes (Signed)
Patient Care Team: Lavone Orn, MD as PCP - General (Internal Medicine) Sueanne Margarita, MD as PCP - Cardiology (Cardiology) Sueanne Margarita, MD as Consulting Physician (Cardiology)  DIAGNOSIS:    ICD-10-CM   1. Thrombocytopenia ([place])  D69.6   2. Anemia of chronic disease  D63.8     CHIEF COMPLIANT: Follow-up of anemia,thrombocytopenia  INTERVAL HISTORY: Joshua Hudson is a 85 y.o. with above-mentioned history of anemia and thrombocytopenia. He received two units of PRBCs on 06/28/20.He presents to the clinic todayto review his labs.    ALLERGIES:  has No Known Allergies.  MEDICATIONS:  Current Outpatient Medications  Medication Sig Dispense Refill  . amiodarone (PACERONE) 200 MG tablet Take 1 tablet (200 mg total) by mouth daily. 90 tablet 3  . ELIQUIS 2.5 MG TABS tablet TAKE 1 TABLET(2.5 MG) BY MOUTH TWICE DAILY 180 tablet 1  . finasteride (PROSCAR) 5 MG tablet Take 5 mg by mouth daily as needed (retention).     . folic acid (FOLVITE) 1 MG tablet Take 1 mg by mouth daily.    . furosemide (LASIX) 40 MG tablet Take 1 tablet (40 mg total) by mouth daily as needed. 90 tablet 3  . leflunomide (ARAVA) 20 MG tablet Take 20 mg by mouth daily.    . Multiple Vitamin (MULTIVITAMIN WITH MINERALS) TABS tablet Take 1 tablet by mouth daily.    . pantoprazole (PROTONIX) 40 MG tablet Take 40 mg by mouth daily before breakfast.     . Polyethyl Glycol-Propyl Glycol (LUBRICANT EYE DROPS) 0.4-0.3 % SOLN Place 1 drop into both eyes 3 (three) times daily as needed (dry/irritated eyes.).    Marland Kitchen potassium chloride SA (KLOR-CON) 20 MEQ tablet Take 1 tablet (20 mEq total) by mouth daily as needed. 60 tablet 0  . predniSONE (DELTASONE) 5 MG tablet Take 5 mg by mouth daily with breakfast.      No current facility-administered medications for this visit.    PHYSICAL EXAMINATION: ECOG PERFORMANCE STATUS: 1 - Symptomatic but completely ambulatory  Vitals:   07/26/20 0837  BP: (!) 120/58  Pulse: 66   Resp: 17  Temp: 98 F (36.7 C)  SpO2: 100%   Filed Weights   07/26/20 0837  Weight: 110 lb 8 oz (50.1 kg)    LABORATORY DATA:  I have reviewed the data as listed CMP Latest Ref Rng & Units 03/31/2020 03/23/2020 02/09/2020  Glucose 70 - 99 mg/dL 98 93 -  BUN 8 - 23 mg/dL 26(H) 22 -  Creatinine 0.61 - 1.24 mg/dL 1.73(H) 1.37(H) -  Sodium 135 - 145 mmol/L 143 142 -  Potassium 3.5 - 5.1 mmol/L 4.3 4.3 -  Chloride 98 - 111 mmol/L 104 101 -  CO2 22 - 32 mmol/L 30 27 -  Calcium 8.9 - 10.3 mg/dL 9.6 9.2 -  Total Protein 6.5 - 8.1 g/dL 6.2(L) - 6.4  Total Bilirubin 0.3 - 1.2 mg/dL 0.6 - 0.4  Alkaline Phos 38 - 126 U/L 38 - 49  AST 15 - 41 U/L 20 - 24  ALT 0 - 44 U/L 11 - 13    Lab Results  Component Value Date   WBC 6.3 07/26/2020   HGB 7.9 (L) 07/26/2020   HCT 25.0 (L) 07/26/2020   MCV 72.3 (L) 07/26/2020   PLT 135 (L) 07/26/2020   NEUTROABS 4.8 07/26/2020    ASSESSMENT & PLAN: Anemia of chronic disease Thrombocytopenia ([place]) On 05/06/2019: Hemoglobin 7.6: Blood transfusion given Hemoglobin electrophoresis: Beta thalassemia minor.This is  the cause of microcytosis. It is not iron deficiency related.  Possible differential: Anemia of chronic disease versus myelodysplastic syndrome versus methotrexate related toxicity. His methotrexate has been on hold. However his hemoglobin continues to remain low therefore we can rule out methotrexate as a cause.  Because of the microcytosis MDS is low likelihood. Hospitalization 09/22/2019-09/25/2019: A. fib with RVR status post cardioversion, currently on Eliquis  Lab review:  09/25/2019: WBC 7.2, hemoglobin 9.6, platelets 145 10/03/19: WBC: 7.5, Hb 8.9, Pl: 135 03/31/2020: WBC 7.9, Hb 9.2, platelets 125 06/24/2020: Hemoglobin 7.4 (received blood transfusion) 07/26/2020: Hemoglobin 7.9 (going to receive blood)   Even though he has microcytic anemia, this microcytosis is related to beta thalassemia. His anemia is due to chronic  disease.   Treatment plan: Because of his requirement for frequent blood transfusions, I recommended addition of Aranesp injections.  We will set him up to receive these injections in 2 weeks.  Our goal is to maintain his hemoglobin around 10 g.  He will come back in 2 weeks to receive Aranesp injection and if his blood counts are low he has an appointment for blood transfusion.   No orders of the defined types were placed in this encounter.  The patient has a good understanding of the overall plan. he agrees with it. he will call with any problems that may develop before the next visit here.  Total time spent: 20 mins including face to face time and time spent for planning, charting and coordination of care  Nicholas Lose, MD 07/26/2020  I, Cloyde Reams Dorshimer, am acting as scribe for Dr. Nicholas Lose.  I have reviewed the above documentation for accuracy and completeness, and I agree with the above.

## 2020-07-25 NOTE — Assessment & Plan Note (Signed)
On 05/06/2019: Hemoglobin 7.6: Blood transfusion given Hemoglobin electrophoresis: Beta thalassemia minor.This is the cause of microcytosis. It is not iron deficiency related.  Possible differential: Myelodysplastic syndrome versus methotrexate related toxicity. His methotrexate has been on hold. Hospitalization 09/22/2019-09/25/2019: A. fib with RVR status post cardioversion, currently on Eliquis  Lab review:  09/25/2019: WBC 7.2, hemoglobin 9.6, platelets 145 10/03/19: WBC: 7.5, Hb 8.9, Pl: 135 03/31/2020: WBC 7.9, Hb 9.2, platelets 125  Even though he has microcytic anemia, this microcytosis is related to beta thalassemia. His anemia is due to chronic disease.   He developed symptoms of stroke with the loss of vision in the right eye which was transient.  He is getting work-up with neurology. This is another reason why I do not want to start him on growth factor injections which can increase his risk of blood clots and stroke.  His hemoglobin is stable and therefore we will continue to watch and monitor.  Return to clinic in 6 months with labs and follow-up

## 2020-07-26 ENCOUNTER — Inpatient Hospital Stay: Payer: Medicare PPO

## 2020-07-26 ENCOUNTER — Inpatient Hospital Stay: Payer: Medicare PPO | Attending: Hematology and Oncology | Admitting: Hematology and Oncology

## 2020-07-26 ENCOUNTER — Other Ambulatory Visit: Payer: Self-pay | Admitting: *Deleted

## 2020-07-26 ENCOUNTER — Other Ambulatory Visit: Payer: Self-pay

## 2020-07-26 DIAGNOSIS — D696 Thrombocytopenia, unspecified: Secondary | ICD-10-CM | POA: Insufficient documentation

## 2020-07-26 DIAGNOSIS — D649 Anemia, unspecified: Secondary | ICD-10-CM | POA: Insufficient documentation

## 2020-07-26 DIAGNOSIS — D638 Anemia in other chronic diseases classified elsewhere: Secondary | ICD-10-CM | POA: Insufficient documentation

## 2020-07-26 DIAGNOSIS — D61818 Other pancytopenia: Secondary | ICD-10-CM

## 2020-07-26 LAB — CBC WITH DIFFERENTIAL (CANCER CENTER ONLY)
Abs Immature Granulocytes: 0.04 10*3/uL (ref 0.00–0.07)
Basophils Absolute: 0 10*3/uL (ref 0.0–0.1)
Basophils Relative: 1 %
Eosinophils Absolute: 0.2 10*3/uL (ref 0.0–0.5)
Eosinophils Relative: 3 %
HCT: 25 % — ABNORMAL LOW (ref 39.0–52.0)
Hemoglobin: 7.9 g/dL — ABNORMAL LOW (ref 13.0–17.0)
Immature Granulocytes: 1 %
Lymphocytes Relative: 8 %
Lymphs Abs: 0.5 10*3/uL — ABNORMAL LOW (ref 0.7–4.0)
MCH: 22.8 pg — ABNORMAL LOW (ref 26.0–34.0)
MCHC: 31.6 g/dL (ref 30.0–36.0)
MCV: 72.3 fL — ABNORMAL LOW (ref 80.0–100.0)
Monocytes Absolute: 0.8 10*3/uL (ref 0.1–1.0)
Monocytes Relative: 12 %
Neutro Abs: 4.8 10*3/uL (ref 1.7–7.7)
Neutrophils Relative %: 75 %
Platelet Count: 135 10*3/uL — ABNORMAL LOW (ref 150–400)
RBC: 3.46 MIL/uL — ABNORMAL LOW (ref 4.22–5.81)
RDW: 21.5 % — ABNORMAL HIGH (ref 11.5–15.5)
WBC Count: 6.3 10*3/uL (ref 4.0–10.5)
nRBC: 0.5 % — ABNORMAL HIGH (ref 0.0–0.2)

## 2020-07-26 LAB — FERRITIN: Ferritin: 411 ng/mL — ABNORMAL HIGH (ref 24–336)

## 2020-07-26 LAB — IRON AND TIBC
Iron: 79 ug/dL (ref 42–163)
Saturation Ratios: 33 % (ref 20–55)
TIBC: 236 ug/dL (ref 202–409)
UIBC: 158 ug/dL (ref 117–376)

## 2020-07-26 LAB — PREPARE RBC (CROSSMATCH)

## 2020-07-26 LAB — VITAMIN B12: Vitamin B-12: 550 pg/mL (ref 180–914)

## 2020-07-26 MED ORDER — DIPHENHYDRAMINE HCL 25 MG PO CAPS
25.0000 mg | ORAL_CAPSULE | Freq: Once | ORAL | Status: AC
  Administered 2020-07-26: 25 mg via ORAL

## 2020-07-26 MED ORDER — ACETAMINOPHEN 325 MG PO TABS
ORAL_TABLET | ORAL | Status: AC
  Filled 2020-07-26: qty 2

## 2020-07-26 MED ORDER — DIPHENHYDRAMINE HCL 25 MG PO CAPS
ORAL_CAPSULE | ORAL | Status: AC
  Filled 2020-07-26: qty 1

## 2020-07-26 MED ORDER — SODIUM CHLORIDE 0.9% IV SOLUTION
250.0000 mL | Freq: Once | INTRAVENOUS | Status: AC
  Administered 2020-07-26: 250 mL via INTRAVENOUS
  Filled 2020-07-26: qty 250

## 2020-07-26 MED ORDER — ACETAMINOPHEN 325 MG PO TABS
650.0000 mg | ORAL_TABLET | Freq: Once | ORAL | Status: AC
  Administered 2020-07-26: 650 mg via ORAL

## 2020-07-26 NOTE — Patient Instructions (Signed)

## 2020-07-26 NOTE — Progress Notes (Signed)
Per MD request, orders placed for pt to receive 2 units of PRBC's today.

## 2020-07-27 LAB — TYPE AND SCREEN
ABO/RH(D): A POS
Antibody Screen: NEGATIVE
Unit division: 0
Unit division: 0

## 2020-07-27 LAB — BPAM RBC
Blood Product Expiration Date: 202111262359
Blood Product Expiration Date: 202112092359
ISSUE DATE / TIME: 202111220957
ISSUE DATE / TIME: 202111220957
Unit Type and Rh: 6200
Unit Type and Rh: 6200

## 2020-08-03 DIAGNOSIS — I5030 Unspecified diastolic (congestive) heart failure: Secondary | ICD-10-CM | POA: Diagnosis not present

## 2020-08-03 DIAGNOSIS — J439 Emphysema, unspecified: Secondary | ICD-10-CM | POA: Diagnosis not present

## 2020-08-03 DIAGNOSIS — M069 Rheumatoid arthritis, unspecified: Secondary | ICD-10-CM | POA: Diagnosis not present

## 2020-08-03 DIAGNOSIS — M19011 Primary osteoarthritis, right shoulder: Secondary | ICD-10-CM | POA: Diagnosis not present

## 2020-08-03 DIAGNOSIS — F4321 Adjustment disorder with depressed mood: Secondary | ICD-10-CM | POA: Diagnosis not present

## 2020-08-03 DIAGNOSIS — I480 Paroxysmal atrial fibrillation: Secondary | ICD-10-CM | POA: Diagnosis not present

## 2020-08-03 DIAGNOSIS — D61818 Other pancytopenia: Secondary | ICD-10-CM | POA: Diagnosis not present

## 2020-08-03 DIAGNOSIS — D509 Iron deficiency anemia, unspecified: Secondary | ICD-10-CM | POA: Diagnosis not present

## 2020-08-03 DIAGNOSIS — J479 Bronchiectasis, uncomplicated: Secondary | ICD-10-CM | POA: Diagnosis not present

## 2020-08-04 ENCOUNTER — Other Ambulatory Visit: Payer: Self-pay | Admitting: Hematology and Oncology

## 2020-08-08 NOTE — Progress Notes (Signed)
Patient Care Team: Lavone Orn, MD as PCP - General (Internal Medicine) Sueanne Margarita, MD as PCP - Cardiology (Cardiology) Sueanne Margarita, MD as Consulting Physician (Cardiology)  DIAGNOSIS:    ICD-10-CM   1. Other pancytopenia ([place])  I14.431     CHIEF COMPLIANT: Follow-up of anemia,thrombocytopenia  INTERVAL HISTORY: Joshua Hudson is a 85 y.o. with above-mentioned history of anemia and thrombocytopenia. He received two units of PRBCs on 07/26/20.He presents to the clinic todayto review his labs.He felt very dizzy and lightheaded yesterday but today feels much better.  Every time he gets blood he feels remarkably better and then it slowly goes down.  ALLERGIES:  has No Known Allergies.  MEDICATIONS:  Current Outpatient Medications  Medication Sig Dispense Refill  . amiodarone (PACERONE) 200 MG tablet Take 1 tablet (200 mg total) by mouth daily. 90 tablet 3  . ELIQUIS 2.5 MG TABS tablet TAKE 1 TABLET(2.5 MG) BY MOUTH TWICE DAILY 180 tablet 1  . finasteride (PROSCAR) 5 MG tablet Take 5 mg by mouth daily as needed (retention).     . folic acid (FOLVITE) 1 MG tablet Take 1 mg by mouth daily.    . furosemide (LASIX) 40 MG tablet Take 1 tablet (40 mg total) by mouth daily as needed. 90 tablet 3  . leflunomide (ARAVA) 20 MG tablet Take 20 mg by mouth daily.    . Multiple Vitamin (MULTIVITAMIN WITH MINERALS) TABS tablet Take 1 tablet by mouth daily.    . pantoprazole (PROTONIX) 40 MG tablet Take 40 mg by mouth daily before breakfast.     . Polyethyl Glycol-Propyl Glycol (LUBRICANT EYE DROPS) 0.4-0.3 % SOLN Place 1 drop into both eyes 3 (three) times daily as needed (dry/irritated eyes.).    Marland Kitchen potassium chloride SA (KLOR-CON) 20 MEQ tablet Take 1 tablet (20 mEq total) by mouth daily as needed. 60 tablet 0  . predniSONE (DELTASONE) 5 MG tablet Take 5 mg by mouth daily with breakfast.      No current facility-administered medications for this visit.    PHYSICAL  EXAMINATION: ECOG PERFORMANCE STATUS: 1 - Symptomatic but completely ambulatory  Vitals:   08/09/20 0836  BP: (!) 119/56  Pulse: 62  Resp: 17  Temp: (!) 97.5 F (36.4 C)  SpO2: 100%   Filed Weights   08/09/20 0836  Weight: 109 lb 4.8 oz (49.6 kg)    LABORATORY DATA:  I have reviewed the data as listed CMP Latest Ref Rng & Units 03/31/2020 03/23/2020 02/09/2020  Glucose 70 - 99 mg/dL 98 93 -  BUN 8 - 23 mg/dL 26(H) 22 -  Creatinine 0.61 - 1.24 mg/dL 1.73(H) 1.37(H) -  Sodium 135 - 145 mmol/L 143 142 -  Potassium 3.5 - 5.1 mmol/L 4.3 4.3 -  Chloride 98 - 111 mmol/L 104 101 -  CO2 22 - 32 mmol/L 30 27 -  Calcium 8.9 - 10.3 mg/dL 9.6 9.2 -  Total Protein 6.5 - 8.1 g/dL 6.2(L) - 6.4  Total Bilirubin 0.3 - 1.2 mg/dL 0.6 - 0.4  Alkaline Phos 38 - 126 U/L 38 - 49  AST 15 - 41 U/L 20 - 24  ALT 0 - 44 U/L 11 - 13    Lab Results  Component Value Date   WBC 9.2 08/09/2020   HGB 9.2 (L) 08/09/2020   HCT 29.1 (L) 08/09/2020   MCV 75.2 (L) 08/09/2020   PLT 175 08/09/2020   NEUTROABS PENDING 08/09/2020    ASSESSMENT & PLAN:  Other  pancytopenia ([place]) On 05/06/2019: Hemoglobin 7.6: Blood transfusion given Hemoglobin electrophoresis: Beta thalassemia minor.This is the cause of microcytosis. It is not iron deficiency related.  Possible differential: Anemia of chronic disease versus myelodysplastic syndrome versus methotrexate related toxicity. His methotrexate has been on hold. However his hemoglobin continues to remain low   Hospitalization 09/22/2019-09/25/2019: A. fib with RVR status post cardioversion, currently on Eliquis  Lab review:  09/25/2019: WBC 7.2, hemoglobin 9.6, platelets 145 10/03/19: WBC: 7.5, Hb 8.9, Pl: 135 03/31/2020:WBC 7.9, Hb 9.2,platelets 125 06/24/2020: Hemoglobin 7.4 (received blood transfusion) 07/26/2020: Hemoglobin 7.9 (going to receive blood) 08/09/2020: Hemoglobin 9.2 (Aranesp injection)  Even though he has microcytic anemia, this microcytosis is  related to beta thalassemia. His anemia is due to chronic disease. Current treatment plan: Aranesp injections, blood transfusions as needed Return to clinic every 3 weeks for Aranesp injection Every 6 weeks to follow-up with me with labs  No orders of the defined types were placed in this encounter.  The patient has a good understanding of the overall plan. he agrees with it. he will call with any problems that may develop before the next visit here.  Total time spent: 20 mins including face to face time and time spent for planning, charting and coordination of care  Nicholas Lose, MD 08/09/2020  I, Cloyde Reams Dorshimer, am acting as scribe for Dr. Nicholas Lose.  I have reviewed the above documentation for accuracy and completeness, and I agree with the above.

## 2020-08-09 ENCOUNTER — Inpatient Hospital Stay: Payer: Medicare PPO | Attending: Hematology and Oncology

## 2020-08-09 ENCOUNTER — Telehealth: Payer: Self-pay | Admitting: Hematology and Oncology

## 2020-08-09 ENCOUNTER — Other Ambulatory Visit: Payer: Self-pay | Admitting: Physician Assistant

## 2020-08-09 ENCOUNTER — Inpatient Hospital Stay: Payer: Medicare PPO

## 2020-08-09 ENCOUNTER — Other Ambulatory Visit: Payer: Self-pay | Admitting: Hematology and Oncology

## 2020-08-09 ENCOUNTER — Other Ambulatory Visit: Payer: Self-pay

## 2020-08-09 ENCOUNTER — Inpatient Hospital Stay: Payer: Medicare PPO | Admitting: Hematology and Oncology

## 2020-08-09 DIAGNOSIS — D469 Myelodysplastic syndrome, unspecified: Secondary | ICD-10-CM | POA: Insufficient documentation

## 2020-08-09 DIAGNOSIS — D638 Anemia in other chronic diseases classified elsewhere: Secondary | ICD-10-CM

## 2020-08-09 DIAGNOSIS — Z952 Presence of prosthetic heart valve: Secondary | ICD-10-CM

## 2020-08-09 DIAGNOSIS — D61818 Other pancytopenia: Secondary | ICD-10-CM | POA: Diagnosis not present

## 2020-08-09 DIAGNOSIS — I350 Nonrheumatic aortic (valve) stenosis: Secondary | ICD-10-CM

## 2020-08-09 DIAGNOSIS — D563 Thalassemia minor: Secondary | ICD-10-CM | POA: Insufficient documentation

## 2020-08-09 LAB — IRON AND TIBC
Iron: 124 ug/dL (ref 42–163)
Saturation Ratios: 55 % (ref 20–55)
TIBC: 224 ug/dL (ref 202–409)
UIBC: 100 ug/dL — ABNORMAL LOW (ref 117–376)

## 2020-08-09 LAB — CBC WITH DIFFERENTIAL (CANCER CENTER ONLY)
Abs Immature Granulocytes: 0.05 10*3/uL (ref 0.00–0.07)
Basophils Absolute: 0 10*3/uL (ref 0.0–0.1)
Basophils Relative: 0 %
Eosinophils Absolute: 0.2 10*3/uL (ref 0.0–0.5)
Eosinophils Relative: 3 %
HCT: 29.1 % — ABNORMAL LOW (ref 39.0–52.0)
Hemoglobin: 9.2 g/dL — ABNORMAL LOW (ref 13.0–17.0)
Immature Granulocytes: 1 %
Lymphocytes Relative: 6 %
Lymphs Abs: 0.6 10*3/uL — ABNORMAL LOW (ref 0.7–4.0)
MCH: 23.8 pg — ABNORMAL LOW (ref 26.0–34.0)
MCHC: 31.6 g/dL (ref 30.0–36.0)
MCV: 75.2 fL — ABNORMAL LOW (ref 80.0–100.0)
Monocytes Absolute: 0.6 10*3/uL (ref 0.1–1.0)
Monocytes Relative: 6 %
Neutro Abs: 7.7 10*3/uL (ref 1.7–7.7)
Neutrophils Relative %: 84 %
Platelet Count: 175 10*3/uL (ref 150–400)
RBC: 3.87 MIL/uL — ABNORMAL LOW (ref 4.22–5.81)
RDW: 22.1 % — ABNORMAL HIGH (ref 11.5–15.5)
WBC Count: 9.2 10*3/uL (ref 4.0–10.5)
nRBC: 0 % (ref 0.0–0.2)

## 2020-08-09 LAB — SAMPLE TO BLOOD BANK

## 2020-08-09 LAB — FERRITIN: Ferritin: 544 ng/mL — ABNORMAL HIGH (ref 24–336)

## 2020-08-09 MED ORDER — EPOETIN ALFA-EPBX 40000 UNIT/ML IJ SOLN
40000.0000 [IU] | Freq: Once | INTRAMUSCULAR | Status: AC
  Administered 2020-08-09: 40000 [IU] via SUBCUTANEOUS

## 2020-08-09 MED ORDER — EPOETIN ALFA-EPBX 40000 UNIT/ML IJ SOLN
INTRAMUSCULAR | Status: AC
  Filled 2020-08-09: qty 1

## 2020-08-09 NOTE — Assessment & Plan Note (Signed)
On 05/06/2019: Hemoglobin 7.6: Blood transfusion given Hemoglobin electrophoresis: Beta thalassemia minor.This is the cause of microcytosis. It is not iron deficiency related.  Possible differential: Anemia of chronic disease versus myelodysplastic syndrome versus methotrexate related toxicity. His methotrexate has been on hold. However his hemoglobin continues to remain low   Hospitalization 09/22/2019-09/25/2019: A. fib with RVR status post cardioversion, currently on Eliquis  Lab review:  09/25/2019: WBC 7.2, hemoglobin 9.6, platelets 145 10/03/19: WBC: 7.5, Hb 8.9, Pl: 135 03/31/2020:WBC 7.9, Hb 9.2,platelets 125 06/24/2020: Hemoglobin 7.4 (received blood transfusion) 07/26/2020: Hemoglobin 7.9 (going to receive blood) 08/09/2020:  Even though he has microcytic anemia, this microcytosis is related to beta thalassemia. His anemia is due to chronic disease. Current treatment plan: Aranesp injections, blood transfusions as needed

## 2020-08-09 NOTE — Patient Instructions (Signed)

## 2020-08-09 NOTE — Progress Notes (Signed)
Patient stable upon leaving infusion room.  

## 2020-08-10 DIAGNOSIS — H353211 Exudative age-related macular degeneration, right eye, with active choroidal neovascularization: Secondary | ICD-10-CM | POA: Diagnosis not present

## 2020-08-12 DIAGNOSIS — H353211 Exudative age-related macular degeneration, right eye, with active choroidal neovascularization: Secondary | ICD-10-CM | POA: Diagnosis not present

## 2020-08-12 DIAGNOSIS — H43813 Vitreous degeneration, bilateral: Secondary | ICD-10-CM | POA: Diagnosis not present

## 2020-08-12 DIAGNOSIS — H35363 Drusen (degenerative) of macula, bilateral: Secondary | ICD-10-CM | POA: Diagnosis not present

## 2020-08-12 DIAGNOSIS — Z961 Presence of intraocular lens: Secondary | ICD-10-CM | POA: Diagnosis not present

## 2020-08-12 DIAGNOSIS — H35453 Secondary pigmentary degeneration, bilateral: Secondary | ICD-10-CM | POA: Diagnosis not present

## 2020-08-12 DIAGNOSIS — H353122 Nonexudative age-related macular degeneration, left eye, intermediate dry stage: Secondary | ICD-10-CM | POA: Diagnosis not present

## 2020-08-12 DIAGNOSIS — H35372 Puckering of macula, left eye: Secondary | ICD-10-CM | POA: Diagnosis not present

## 2020-08-30 ENCOUNTER — Inpatient Hospital Stay: Payer: Medicare PPO

## 2020-08-30 ENCOUNTER — Other Ambulatory Visit: Payer: Medicare PPO

## 2020-08-30 ENCOUNTER — Ambulatory Visit: Payer: Medicare PPO

## 2020-08-30 ENCOUNTER — Other Ambulatory Visit: Payer: Self-pay

## 2020-08-30 VITALS — BP 144/80 | HR 63 | Temp 96.8°F | Resp 18

## 2020-08-30 DIAGNOSIS — D638 Anemia in other chronic diseases classified elsewhere: Secondary | ICD-10-CM

## 2020-08-30 DIAGNOSIS — D563 Thalassemia minor: Secondary | ICD-10-CM | POA: Diagnosis not present

## 2020-08-30 DIAGNOSIS — D61818 Other pancytopenia: Secondary | ICD-10-CM

## 2020-08-30 DIAGNOSIS — D469 Myelodysplastic syndrome, unspecified: Secondary | ICD-10-CM | POA: Diagnosis not present

## 2020-08-30 LAB — CBC WITH DIFFERENTIAL (CANCER CENTER ONLY)
Abs Immature Granulocytes: 0.04 10*3/uL (ref 0.00–0.07)
Basophils Absolute: 0 10*3/uL (ref 0.0–0.1)
Basophils Relative: 0 %
Eosinophils Absolute: 0.1 10*3/uL (ref 0.0–0.5)
Eosinophils Relative: 1 %
HCT: 26.1 % — ABNORMAL LOW (ref 39.0–52.0)
Hemoglobin: 8.1 g/dL — ABNORMAL LOW (ref 13.0–17.0)
Immature Granulocytes: 1 %
Lymphocytes Relative: 7 %
Lymphs Abs: 0.5 10*3/uL — ABNORMAL LOW (ref 0.7–4.0)
MCH: 22.8 pg — ABNORMAL LOW (ref 26.0–34.0)
MCHC: 31 g/dL (ref 30.0–36.0)
MCV: 73.3 fL — ABNORMAL LOW (ref 80.0–100.0)
Monocytes Absolute: 0.4 10*3/uL (ref 0.1–1.0)
Monocytes Relative: 6 %
Neutro Abs: 5.9 10*3/uL (ref 1.7–7.7)
Neutrophils Relative %: 85 %
Platelet Count: 141 10*3/uL — ABNORMAL LOW (ref 150–400)
RBC: 3.56 MIL/uL — ABNORMAL LOW (ref 4.22–5.81)
RDW: 20.5 % — ABNORMAL HIGH (ref 11.5–15.5)
WBC Count: 6.9 10*3/uL (ref 4.0–10.5)
nRBC: 0.3 % — ABNORMAL HIGH (ref 0.0–0.2)

## 2020-08-30 MED ORDER — EPOETIN ALFA-EPBX 40000 UNIT/ML IJ SOLN
INTRAMUSCULAR | Status: AC
  Filled 2020-08-30: qty 1

## 2020-08-30 MED ORDER — EPOETIN ALFA-EPBX 40000 UNIT/ML IJ SOLN
40000.0000 [IU] | Freq: Once | INTRAMUSCULAR | Status: AC
  Administered 2020-08-30: 13:00:00 40000 [IU] via SUBCUTANEOUS

## 2020-08-30 NOTE — Patient Instructions (Signed)

## 2020-09-01 DIAGNOSIS — N401 Enlarged prostate with lower urinary tract symptoms: Secondary | ICD-10-CM | POA: Diagnosis not present

## 2020-09-01 DIAGNOSIS — R310 Gross hematuria: Secondary | ICD-10-CM | POA: Diagnosis not present

## 2020-09-01 DIAGNOSIS — R3912 Poor urinary stream: Secondary | ICD-10-CM | POA: Diagnosis not present

## 2020-09-14 DIAGNOSIS — H353211 Exudative age-related macular degeneration, right eye, with active choroidal neovascularization: Secondary | ICD-10-CM | POA: Diagnosis not present

## 2020-09-20 ENCOUNTER — Telehealth: Payer: Self-pay | Admitting: Hematology and Oncology

## 2020-09-20 NOTE — Telephone Encounter (Signed)
Changed appts time for 1/18. Called and spoke with pt, daughter confirmed new appt times

## 2020-09-20 NOTE — Assessment & Plan Note (Deleted)
On 05/06/2019: Hemoglobin 7.6: Blood transfusion given Hemoglobin electrophoresis: Beta thalassemia minor.This is the cause of microcytosis. It is not iron deficiency related.  Possible differential:Anemia of chronic disease versus myelodysplastic syndrome versus methotrexate related toxicity. His methotrexate has been on hold.However his hemoglobin continues to remain low   Hospitalization 09/22/2019-09/25/2019: A. fib with RVR status post cardioversion, currently on Eliquis  Lab review:  09/25/2019: WBC 7.2, hemoglobin 9.6, platelets 145 10/03/19: WBC: 7.5, Hb 8.9, Pl: 135 03/31/2020:WBC 7.9, Hb 9.2,platelets 125 06/24/2020: Hemoglobin 7.4 (received blood transfusion) 07/26/2020: Hemoglobin 7.9 (going to receive blood) 08/09/2020: Hemoglobin 9.2 (Aranesp injection)  Even though he has microcytic anemia, this microcytosis is related to beta thalassemia. His anemia is due to chronic disease.  Current treatment plan: Aranesp injections, blood transfusions as needed Return to clinic every 3 weeks for Aranesp injection Every 6 weeks to follow-up with me with labs 

## 2020-09-20 NOTE — Progress Notes (Signed)
Patient Care Team: Lavone Orn, MD as PCP - General (Internal Medicine) Sueanne Margarita, MD as PCP - Cardiology (Cardiology) Sueanne Margarita, MD as Consulting Physician (Cardiology)  DIAGNOSIS:    ICD-10-CM   1. Anemia of chronic disease  D63.8     CHIEF COMPLIANT: Follow-up of anemia,thrombocytopenia  INTERVAL HISTORY: Joshua Hudson is a 86 y.o. with above-mentioned history of anemia and thrombocytopenia currently on treatment with Aranesp (last 08/30/20). He presents to the clinic todayto review his labs. Patient reports to be feeling profoundly tired especially over the past 3 to 4 days.  He does improve after he gets the shot but then they decline fairly quickly.  Today's hemoglobin is 8.1.  No evidence of bleeding.  ALLERGIES:  has No Known Allergies.  MEDICATIONS:  Current Outpatient Medications  Medication Sig Dispense Refill   amiodarone (PACERONE) 200 MG tablet Take 1 tablet (200 mg total) by mouth daily. 90 tablet 3   ELIQUIS 2.5 MG TABS tablet TAKE 1 TABLET(2.5 MG) BY MOUTH TWICE DAILY 180 tablet 1   finasteride (PROSCAR) 5 MG tablet Take 5 mg by mouth daily as needed (retention).      folic acid (FOLVITE) 1 MG tablet Take 1 mg by mouth daily.     furosemide (LASIX) 40 MG tablet Take 1 tablet (40 mg total) by mouth daily as needed. 90 tablet 3   leflunomide (ARAVA) 20 MG tablet Take 20 mg by mouth daily.     Multiple Vitamin (MULTIVITAMIN WITH MINERALS) TABS tablet Take 1 tablet by mouth daily.     pantoprazole (PROTONIX) 40 MG tablet Take 40 mg by mouth daily before breakfast.      Polyethyl Glycol-Propyl Glycol (LUBRICANT EYE DROPS) 0.4-0.3 % SOLN Place 1 drop into both eyes 3 (three) times daily as needed (dry/irritated eyes.).     potassium chloride SA (KLOR-CON) 20 MEQ tablet Take 1 tablet (20 mEq total) by mouth daily as needed. 60 tablet 0   predniSONE (DELTASONE) 5 MG tablet Take 5 mg by mouth daily with breakfast.      No current  facility-administered medications for this visit.    PHYSICAL EXAMINATION: ECOG PERFORMANCE STATUS: 3 - Symptomatic, >50% confined to bed  Vitals:   09/21/20 1034  BP: (!) 114/52  Pulse: 62  Resp: 15  Temp: 97.9 F (36.6 C)  SpO2: 100%   Filed Weights   09/21/20 1034  Weight: 109 lb 1.6 oz (49.5 kg)    LABORATORY DATA:  I have reviewed the data as listed CMP Latest Ref Rng & Units 03/31/2020 03/23/2020 02/09/2020  Glucose 70 - 99 mg/dL 98 93 -  BUN 8 - 23 mg/dL 26(H) 22 -  Creatinine 0.61 - 1.24 mg/dL 1.73(H) 1.37(H) -  Sodium 135 - 145 mmol/L 143 142 -  Potassium 3.5 - 5.1 mmol/L 4.3 4.3 -  Chloride 98 - 111 mmol/L 104 101 -  CO2 22 - 32 mmol/L 30 27 -  Calcium 8.9 - 10.3 mg/dL 9.6 9.2 -  Total Protein 6.5 - 8.1 g/dL 6.2(L) - 6.4  Total Bilirubin 0.3 - 1.2 mg/dL 0.6 - 0.4  Alkaline Phos 38 - 126 U/L 38 - 49  AST 15 - 41 U/L 20 - 24  ALT 0 - 44 U/L 11 - 13    Lab Results  Component Value Date   WBC 6.9 09/21/2020   HGB 7.9 (L) 09/21/2020   HCT 25.9 (L) 09/21/2020   MCV 72.1 (L) 09/21/2020   PLT 133 (  L) 09/21/2020   NEUTROABS 6.0 09/21/2020    ASSESSMENT & PLAN:  Anemia of chronic disease On 05/06/2019: Hemoglobin 7.6: Blood transfusion given Hemoglobin electrophoresis: Beta thalassemia minor.This is the cause of microcytosis. It is not iron deficiency related.  Possible differential:Anemia of chronic disease versus myelodysplastic syndrome versus methotrexate related toxicity. His methotrexate has been on hold.However his hemoglobin continues to remain low   Hospitalization 09/22/2019-09/25/2019: A. fib with RVR status post cardioversion, currently on Eliquis  Lab review:  09/25/2019: WBC 7.2, hemoglobin 9.6, platelets 145 10/03/19: WBC: 7.5, Hb 8.9, Pl: 135 03/31/2020:WBC 7.9, Hb 9.2,platelets 125 06/24/2020: Hemoglobin 7.4 (received blood transfusion) 07/26/2020: Hemoglobin 7.9 (going to receive blood) 08/09/2020: Hemoglobin 9.2 (Retacrit  injection) 09/21/2020: Hemoglobin 8.1 (Retacrit injection)  Even though he has microcytic anemia, this microcytosis is related to beta thalassemia. His anemia is due to chronic disease.  Current treatment plan: Retacrit injections, blood transfusions as needed Return to clinic every 3 weeks for Retacrit injection  Every 6 weeks to follow-up with me with labs    No orders of the defined types were placed in this encounter.  The patient has a good understanding of the overall plan. he agrees with it. he will call with any problems that may develop before the next visit here.  Total time spent: 20 mins including face to face time and time spent for planning, charting and coordination of care  Nicholas Lose, MD 09/21/2020  I, Cloyde Reams Dorshimer, am acting as scribe for Dr. Nicholas Lose.  I have reviewed the above documentation for accuracy and completeness, and I agree with the above.

## 2020-09-21 ENCOUNTER — Inpatient Hospital Stay: Payer: Medicare PPO | Admitting: Hematology and Oncology

## 2020-09-21 ENCOUNTER — Ambulatory Visit: Payer: Medicare PPO

## 2020-09-21 ENCOUNTER — Other Ambulatory Visit: Payer: Self-pay

## 2020-09-21 ENCOUNTER — Inpatient Hospital Stay: Payer: Medicare PPO | Attending: Hematology and Oncology

## 2020-09-21 ENCOUNTER — Inpatient Hospital Stay: Payer: Medicare PPO

## 2020-09-21 ENCOUNTER — Other Ambulatory Visit: Payer: Self-pay | Admitting: *Deleted

## 2020-09-21 ENCOUNTER — Other Ambulatory Visit: Payer: Medicare PPO

## 2020-09-21 DIAGNOSIS — D563 Thalassemia minor: Secondary | ICD-10-CM

## 2020-09-21 DIAGNOSIS — D638 Anemia in other chronic diseases classified elsewhere: Secondary | ICD-10-CM

## 2020-09-21 DIAGNOSIS — I4891 Unspecified atrial fibrillation: Secondary | ICD-10-CM | POA: Diagnosis not present

## 2020-09-21 DIAGNOSIS — Z7901 Long term (current) use of anticoagulants: Secondary | ICD-10-CM | POA: Diagnosis not present

## 2020-09-21 LAB — CMP (CANCER CENTER ONLY)
ALT: 16 U/L (ref 0–44)
AST: 26 U/L (ref 15–41)
Albumin: 3.9 g/dL (ref 3.5–5.0)
Alkaline Phosphatase: 37 U/L — ABNORMAL LOW (ref 38–126)
Anion gap: 10 (ref 5–15)
BUN: 26 mg/dL — ABNORMAL HIGH (ref 8–23)
CO2: 31 mmol/L (ref 22–32)
Calcium: 8.8 mg/dL — ABNORMAL LOW (ref 8.9–10.3)
Chloride: 101 mmol/L (ref 98–111)
Creatinine: 1.72 mg/dL — ABNORMAL HIGH (ref 0.61–1.24)
GFR, Estimated: 39 mL/min — ABNORMAL LOW (ref >60)
Glucose, Bld: 103 mg/dL — ABNORMAL HIGH (ref 70–99)
Potassium: 3.8 mmol/L (ref 3.5–5.1)
Sodium: 142 mmol/L (ref 135–145)
Total Bilirubin: 0.7 mg/dL (ref 0.3–1.2)
Total Protein: 6 g/dL — ABNORMAL LOW (ref 6.5–8.1)

## 2020-09-21 LAB — CBC WITH DIFFERENTIAL (CANCER CENTER ONLY)
Abs Immature Granulocytes: 0.05 10*3/uL (ref 0.00–0.07)
Basophils Absolute: 0 10*3/uL (ref 0.0–0.1)
Basophils Relative: 1 %
Eosinophils Absolute: 0.1 10*3/uL (ref 0.0–0.5)
Eosinophils Relative: 2 %
HCT: 25.9 % — ABNORMAL LOW (ref 39.0–52.0)
Hemoglobin: 7.9 g/dL — ABNORMAL LOW (ref 13.0–17.0)
Immature Granulocytes: 1 %
Lymphocytes Relative: 6 %
Lymphs Abs: 0.4 10*3/uL — ABNORMAL LOW (ref 0.7–4.0)
MCH: 22 pg — ABNORMAL LOW (ref 26.0–34.0)
MCHC: 30.5 g/dL (ref 30.0–36.0)
MCV: 72.1 fL — ABNORMAL LOW (ref 80.0–100.0)
Monocytes Absolute: 0.3 10*3/uL (ref 0.1–1.0)
Monocytes Relative: 5 %
Neutro Abs: 6 10*3/uL (ref 1.7–7.7)
Neutrophils Relative %: 85 %
Platelet Count: 133 10*3/uL — ABNORMAL LOW (ref 150–400)
RBC: 3.59 MIL/uL — ABNORMAL LOW (ref 4.22–5.81)
RDW: 18.7 % — ABNORMAL HIGH (ref 11.5–15.5)
WBC Count: 6.9 10*3/uL (ref 4.0–10.5)
nRBC: 0 % (ref 0.0–0.2)

## 2020-09-21 LAB — IRON AND TIBC
Iron: 99 ug/dL (ref 42–163)
Saturation Ratios: 41 % (ref 20–55)
TIBC: 239 ug/dL (ref 202–409)
UIBC: 140 ug/dL (ref 117–376)

## 2020-09-21 LAB — SAMPLE TO BLOOD BANK

## 2020-09-21 MED ORDER — EPOETIN ALFA-EPBX 40000 UNIT/ML IJ SOLN
40000.0000 [IU] | Freq: Once | INTRAMUSCULAR | Status: AC
  Administered 2020-09-21: 40000 [IU] via SUBCUTANEOUS

## 2020-09-21 MED ORDER — EPOETIN ALFA-EPBX 40000 UNIT/ML IJ SOLN
INTRAMUSCULAR | Status: AC
  Filled 2020-09-21: qty 1

## 2020-09-21 NOTE — Assessment & Plan Note (Signed)
On 05/06/2019: Hemoglobin 7.6: Blood transfusion given Hemoglobin electrophoresis: Beta thalassemia minor.This is the cause of microcytosis. It is not iron deficiency related.  Possible differential:Anemia of chronic disease versus myelodysplastic syndrome versus methotrexate related toxicity. His methotrexate has been on hold.However his hemoglobin continues to remain low   Hospitalization 09/22/2019-09/25/2019: A. fib with RVR status post cardioversion, currently on Eliquis  Lab review:  09/25/2019: WBC 7.2, hemoglobin 9.6, platelets 145 10/03/19: WBC: 7.5, Hb 8.9, Pl: 135 03/31/2020:WBC 7.9, Hb 9.2,platelets 125 06/24/2020: Hemoglobin 7.4 (received blood transfusion) 07/26/2020: Hemoglobin 7.9 (going to receive blood) 08/09/2020: Hemoglobin 9.2 (Aranesp injection)  Even though he has microcytic anemia, this microcytosis is related to beta thalassemia. His anemia is due to chronic disease.  Current treatment plan: Aranesp injections, blood transfusions as needed Return to clinic every 3 weeks for Aranesp injection Every 6 weeks to follow-up with me with labs

## 2020-09-22 ENCOUNTER — Telehealth: Payer: Self-pay | Admitting: Hematology and Oncology

## 2020-09-22 LAB — FERRITIN: Ferritin: 309 ng/mL (ref 24–336)

## 2020-09-22 NOTE — Telephone Encounter (Signed)
Scheduled per 1/18 los. Pt will receive an updated appt calendar per next visit appt notes  

## 2020-09-29 ENCOUNTER — Other Ambulatory Visit: Payer: Self-pay | Admitting: Physician Assistant

## 2020-09-29 ENCOUNTER — Other Ambulatory Visit: Payer: Self-pay

## 2020-09-29 NOTE — Telephone Encounter (Signed)
Outpatient Medication Detail   Disp Refills Start End   potassium chloride SA (KLOR-CON) 20 MEQ tablet 90 tablet 2 09/29/2020    Sig: TAKE 1 TABLET BY MOUTH EVERY DAY   Sent to pharmacy as: potassium chloride SA (KLOR-CON) 20 MEQ tablet   E-Prescribing Status: Receipt confirmed by pharmacy (09/29/2020  2:50 PM EST)     Pharmacy  [id number] - [place], [place] - [address]

## 2020-10-01 ENCOUNTER — Ambulatory Visit: Payer: Medicare PPO | Admitting: Hematology and Oncology

## 2020-10-01 ENCOUNTER — Other Ambulatory Visit: Payer: Medicare PPO

## 2020-10-11 ENCOUNTER — Ambulatory Visit: Payer: Medicare PPO

## 2020-10-11 ENCOUNTER — Other Ambulatory Visit: Payer: Self-pay

## 2020-10-11 ENCOUNTER — Other Ambulatory Visit: Payer: Medicare PPO

## 2020-10-11 ENCOUNTER — Other Ambulatory Visit: Payer: Self-pay | Admitting: *Deleted

## 2020-10-11 ENCOUNTER — Inpatient Hospital Stay: Payer: Medicare PPO

## 2020-10-11 ENCOUNTER — Inpatient Hospital Stay: Payer: Medicare PPO | Attending: Hematology and Oncology

## 2020-10-11 VITALS — BP 120/59 | HR 72 | Resp 18

## 2020-10-11 DIAGNOSIS — D696 Thrombocytopenia, unspecified: Secondary | ICD-10-CM | POA: Insufficient documentation

## 2020-10-11 DIAGNOSIS — D598 Other acquired hemolytic anemias: Secondary | ICD-10-CM

## 2020-10-11 DIAGNOSIS — D638 Anemia in other chronic diseases classified elsewhere: Secondary | ICD-10-CM

## 2020-10-11 DIAGNOSIS — D563 Thalassemia minor: Secondary | ICD-10-CM | POA: Insufficient documentation

## 2020-10-11 DIAGNOSIS — Z7901 Long term (current) use of anticoagulants: Secondary | ICD-10-CM | POA: Insufficient documentation

## 2020-10-11 DIAGNOSIS — I4891 Unspecified atrial fibrillation: Secondary | ICD-10-CM | POA: Insufficient documentation

## 2020-10-11 LAB — CBC WITH DIFFERENTIAL (CANCER CENTER ONLY)
Abs Immature Granulocytes: 0.05 10*3/uL (ref 0.00–0.07)
Basophils Absolute: 0 10*3/uL (ref 0.0–0.1)
Basophils Relative: 1 %
Eosinophils Absolute: 0.3 10*3/uL (ref 0.0–0.5)
Eosinophils Relative: 3 %
HCT: 27.5 % — ABNORMAL LOW (ref 39.0–52.0)
Hemoglobin: 8.5 g/dL — ABNORMAL LOW (ref 13.0–17.0)
Immature Granulocytes: 1 %
Lymphocytes Relative: 7 %
Lymphs Abs: 0.6 10*3/uL — ABNORMAL LOW (ref 0.7–4.0)
MCH: 22.1 pg — ABNORMAL LOW (ref 26.0–34.0)
MCHC: 30.9 g/dL (ref 30.0–36.0)
MCV: 71.6 fL — ABNORMAL LOW (ref 80.0–100.0)
Monocytes Absolute: 0.6 10*3/uL (ref 0.1–1.0)
Monocytes Relative: 7 %
Neutro Abs: 6.6 10*3/uL (ref 1.7–7.7)
Neutrophils Relative %: 81 %
Platelet Count: 149 10*3/uL — ABNORMAL LOW (ref 150–400)
RBC: 3.84 MIL/uL — ABNORMAL LOW (ref 4.22–5.81)
RDW: 18.1 % — ABNORMAL HIGH (ref 11.5–15.5)
WBC Count: 8.1 10*3/uL (ref 4.0–10.5)
nRBC: 0.4 % — ABNORMAL HIGH (ref 0.0–0.2)

## 2020-10-11 LAB — CMP (CANCER CENTER ONLY)
ALT: 14 U/L (ref 0–44)
AST: 25 U/L (ref 15–41)
Albumin: 3.8 g/dL (ref 3.5–5.0)
Alkaline Phosphatase: 43 U/L (ref 38–126)
Anion gap: 8 (ref 5–15)
BUN: 33 mg/dL — ABNORMAL HIGH (ref 8–23)
CO2: 29 mmol/L (ref 22–32)
Calcium: 8.9 mg/dL (ref 8.9–10.3)
Chloride: 105 mmol/L (ref 98–111)
Creatinine: 2.11 mg/dL — ABNORMAL HIGH (ref 0.61–1.24)
GFR, Estimated: 30 mL/min — ABNORMAL LOW (ref >60)
Glucose, Bld: 88 mg/dL (ref 70–99)
Potassium: 5.5 mmol/L — ABNORMAL HIGH (ref 3.5–5.1)
Sodium: 142 mmol/L (ref 135–145)
Total Bilirubin: 0.6 mg/dL (ref 0.3–1.2)
Total Protein: 6.1 g/dL — ABNORMAL LOW (ref 6.5–8.1)

## 2020-10-11 LAB — SAMPLE TO BLOOD BANK

## 2020-10-11 MED ORDER — EPOETIN ALFA-EPBX 40000 UNIT/ML IJ SOLN
40000.0000 [IU] | Freq: Once | INTRAMUSCULAR | Status: AC
  Administered 2020-10-11: 40000 [IU] via SUBCUTANEOUS

## 2020-10-11 MED ORDER — EPOETIN ALFA-EPBX 40000 UNIT/ML IJ SOLN
INTRAMUSCULAR | Status: AC
  Filled 2020-10-11: qty 1

## 2020-10-12 DIAGNOSIS — G459 Transient cerebral ischemic attack, unspecified: Secondary | ICD-10-CM | POA: Diagnosis not present

## 2020-10-12 DIAGNOSIS — Z79899 Other long term (current) drug therapy: Secondary | ICD-10-CM | POA: Diagnosis not present

## 2020-10-12 DIAGNOSIS — M810 Age-related osteoporosis without current pathological fracture: Secondary | ICD-10-CM | POA: Diagnosis not present

## 2020-10-12 DIAGNOSIS — M0579 Rheumatoid arthritis with rheumatoid factor of multiple sites without organ or systems involvement: Secondary | ICD-10-CM | POA: Diagnosis not present

## 2020-10-12 DIAGNOSIS — D561 Beta thalassemia: Secondary | ICD-10-CM | POA: Diagnosis not present

## 2020-10-12 DIAGNOSIS — M2550 Pain in unspecified joint: Secondary | ICD-10-CM | POA: Diagnosis not present

## 2020-10-12 DIAGNOSIS — Z681 Body mass index (BMI) 19 or less, adult: Secondary | ICD-10-CM | POA: Diagnosis not present

## 2020-10-20 DIAGNOSIS — H353211 Exudative age-related macular degeneration, right eye, with active choroidal neovascularization: Secondary | ICD-10-CM | POA: Diagnosis not present

## 2020-10-28 ENCOUNTER — Other Ambulatory Visit: Payer: Self-pay | Admitting: *Deleted

## 2020-10-28 DIAGNOSIS — D638 Anemia in other chronic diseases classified elsewhere: Secondary | ICD-10-CM

## 2020-10-31 ENCOUNTER — Other Ambulatory Visit: Payer: Self-pay | Admitting: Internal Medicine

## 2020-10-31 NOTE — Assessment & Plan Note (Signed)
On 05/06/2019: Hemoglobin 7.6: Blood transfusion given Hemoglobin electrophoresis: Beta thalassemia minor.This is the cause of microcytosis. It is not iron deficiency related.  Possible differential:Anemia of chronic disease versus myelodysplastic syndrome versus methotrexate related toxicity. His methotrexate has been on hold.However his hemoglobin continues to remain low   Hospitalization 09/22/2019-09/25/2019: A. fib with RVR status post cardioversion, currently on Eliquis  Lab review:  09/25/2019: WBC 7.2, hemoglobin 9.6, platelets 145 10/03/19: WBC: 7.5, Hb 8.9, Pl: 135 03/31/2020:WBC 7.9, Hb 9.2,platelets 125 06/24/2020: Hemoglobin 7.4 (received blood transfusion) 07/26/2020: Hemoglobin 7.9 (going to receive blood) 08/09/2020:Hemoglobin 9.2 (Retacrit injection) 09/21/2020: Hemoglobin 8.1 (Retacrit injection) 11/01/2020:  Even though he has microcytic anemia, this microcytosis is related to beta thalassemia. His anemia is due to chronic disease.  Current treatment plan: Retacrit injections, blood transfusions as needed Return to clinic every 3 weeks for Retacrit injection  Every 6 weeks to follow-up with me with labs

## 2020-10-31 NOTE — Progress Notes (Signed)
Patient Care Team: Lavone Orn, MD as PCP - General (Internal Medicine) Sueanne Margarita, MD as PCP - Cardiology (Cardiology) Sueanne Margarita, MD as Consulting Physician (Cardiology)  DIAGNOSIS:    ICD-10-CM   1. Anemia of chronic disease  D63.8     CHIEF COMPLIANT: Follow-up of anemia,thrombocytopenia  INTERVAL HISTORY: Joshua Hudson is a 86 y.o. with above-mentioned history of anemia and thrombocytopenia currently on treatment with Aranesp (last 10/11/20). He presents to the clinic todayto review his labs. Continues to suffer from severe fatigue after minimal exertion.  Denies any lightheadedness or dizziness.  He has rheumatoid arthritis for which he takes prednisone.  ALLERGIES:  has No Known Allergies.  MEDICATIONS:  Current Outpatient Medications  Medication Sig Dispense Refill  . amiodarone (PACERONE) 200 MG tablet Take 1 tablet (200 mg total) by mouth daily. 90 tablet 3  . ELIQUIS 2.5 MG TABS tablet TAKE 1 TABLET(2.5 MG) BY MOUTH TWICE DAILY 180 tablet 1  . finasteride (PROSCAR) 5 MG tablet Take 5 mg by mouth daily as needed (retention).     . folic acid (FOLVITE) 1 MG tablet Take 1 mg by mouth daily.    . furosemide (LASIX) 40 MG tablet Take 1 tablet (40 mg total) by mouth daily as needed. 90 tablet 3  . leflunomide (ARAVA) 20 MG tablet Take 20 mg by mouth daily.    . Multiple Vitamin (MULTIVITAMIN WITH MINERALS) TABS tablet Take 1 tablet by mouth daily.    . pantoprazole (PROTONIX) 40 MG tablet Take 40 mg by mouth daily before breakfast.     . Polyethyl Glycol-Propyl Glycol (LUBRICANT EYE DROPS) 0.4-0.3 % SOLN Place 1 drop into both eyes 3 (three) times daily as needed (dry/irritated eyes.).    Marland Kitchen potassium chloride SA (KLOR-CON) 20 MEQ tablet TAKE 1 TABLET BY MOUTH EVERY DAY 90 tablet 2  . predniSONE (DELTASONE) 5 MG tablet Take 5 mg by mouth daily with breakfast.      No current facility-administered medications for this visit.    PHYSICAL EXAMINATION: ECOG  PERFORMANCE STATUS: 1 - Symptomatic but completely ambulatory  Vitals:   11/01/20 0822  BP: (!) 139/59  Pulse: 69  Resp: 17  Temp: 97.7 F (36.5 C)  SpO2: 100%   Filed Weights   11/01/20 0822  Weight: 111 lb 9.6 oz (50.6 kg)    LABORATORY DATA:  I have reviewed the data as listed CMP Latest Ref Rng & Units 10/11/2020 09/21/2020 03/31/2020  Glucose 70 - 99 mg/dL 88 103(H) 98  BUN 8 - 23 mg/dL 33(H) 26(H) 26(H)  Creatinine 0.61 - 1.24 mg/dL 2.11(H) 1.72(H) 1.73(H)  Sodium 135 - 145 mmol/L 142 142 143  Potassium 3.5 - 5.1 mmol/L 5.5(H) 3.8 4.3  Chloride 98 - 111 mmol/L 105 101 104  CO2 22 - 32 mmol/L 29 31 30   Calcium 8.9 - 10.3 mg/dL 8.9 8.8(L) 9.6  Total Protein 6.5 - 8.1 g/dL 6.1(L) 6.0(L) 6.2(L)  Total Bilirubin 0.3 - 1.2 mg/dL 0.6 0.7 0.6  Alkaline Phos 38 - 126 U/L 43 37(L) 38  AST 15 - 41 U/L 25 26 20   ALT 0 - 44 U/L 14 16 11     Lab Results  Component Value Date   WBC 8.4 11/01/2020   HGB 8.6 (L) 11/01/2020   HCT 28.1 (L) 11/01/2020   MCV 70.6 (L) 11/01/2020   PLT 121 (L) 11/01/2020   NEUTROABS 6.6 11/01/2020    ASSESSMENT & PLAN:  Anemia of chronic disease On 05/06/2019:  Hemoglobin 7.6: Blood transfusion given Hemoglobin electrophoresis: Beta thalassemia minor.This is the cause of microcytosis. It is not iron deficiency related.  Possible differential:Anemia of chronic disease versus myelodysplastic syndrome versus methotrexate related toxicity. His methotrexate has been on hold.However his hemoglobin continues to remain low   Hospitalization 09/22/2019-09/25/2019: A. fib with RVR status post cardioversion, currently on Eliquis  Lab review:  09/25/2019: WBC 7.2, hemoglobin 9.6, platelets 145 10/03/19: WBC: 7.5, Hb 8.9, Pl: 135 03/31/2020:WBC 7.9, Hb 9.2,platelets 125 06/24/2020: Hemoglobin 7.4 (received blood transfusion) 07/26/2020: Hemoglobin 7.9 (going to receive blood) 08/09/2020:Hemoglobin 9.2 (Retacrit injection) 09/21/2020: Hemoglobin 8.1  (Retacrit injection) 11/01/2020: Hemoglobin 8.6 (Retacrit injection)  Even though he has microcytic anemia, this microcytosis is related to beta thalassemia. His anemia is due to chronic disease.  Current treatment plan: Retacrit injections, blood transfusions as needed Return to clinic every 3 weeks for Retacrit injection  Every 6 weeks to follow-up with me with labs    No orders of the defined types were placed in this encounter.  The patient has a good understanding of the overall plan. he agrees with it. he will call with any problems that may develop before the next visit here.  Total time spent: 20 mins including face to face time and time spent for planning, charting and coordination of care  Joshua Eisenmenger, MD, MPH 11/01/2020  I, Joshua Hudson, am acting as scribe for Dr. Nicholas Hudson.  I have reviewed the above documentation for accuracy and completeness, and I agree with the above.

## 2020-11-01 ENCOUNTER — Other Ambulatory Visit: Payer: Self-pay

## 2020-11-01 ENCOUNTER — Ambulatory Visit: Payer: Medicare PPO

## 2020-11-01 ENCOUNTER — Other Ambulatory Visit: Payer: Medicare PPO

## 2020-11-01 ENCOUNTER — Inpatient Hospital Stay: Payer: Medicare PPO

## 2020-11-01 ENCOUNTER — Telehealth: Payer: Self-pay | Admitting: Hematology and Oncology

## 2020-11-01 ENCOUNTER — Inpatient Hospital Stay: Payer: Medicare PPO | Admitting: Hematology and Oncology

## 2020-11-01 VITALS — BP 127/69 | HR 60 | Resp 18

## 2020-11-01 DIAGNOSIS — D638 Anemia in other chronic diseases classified elsewhere: Secondary | ICD-10-CM | POA: Diagnosis not present

## 2020-11-01 DIAGNOSIS — D696 Thrombocytopenia, unspecified: Secondary | ICD-10-CM | POA: Diagnosis not present

## 2020-11-01 DIAGNOSIS — Z7901 Long term (current) use of anticoagulants: Secondary | ICD-10-CM | POA: Diagnosis not present

## 2020-11-01 DIAGNOSIS — D563 Thalassemia minor: Secondary | ICD-10-CM | POA: Diagnosis not present

## 2020-11-01 DIAGNOSIS — M069 Rheumatoid arthritis, unspecified: Secondary | ICD-10-CM

## 2020-11-01 DIAGNOSIS — I4891 Unspecified atrial fibrillation: Secondary | ICD-10-CM | POA: Diagnosis not present

## 2020-11-01 LAB — CBC WITH DIFFERENTIAL (CANCER CENTER ONLY)
Abs Immature Granulocytes: 0.04 10*3/uL (ref 0.00–0.07)
Basophils Absolute: 0 10*3/uL (ref 0.0–0.1)
Basophils Relative: 1 %
Eosinophils Absolute: 0.5 10*3/uL (ref 0.0–0.5)
Eosinophils Relative: 6 %
HCT: 28.1 % — ABNORMAL LOW (ref 39.0–52.0)
Hemoglobin: 8.6 g/dL — ABNORMAL LOW (ref 13.0–17.0)
Immature Granulocytes: 1 %
Lymphocytes Relative: 8 %
Lymphs Abs: 0.7 10*3/uL (ref 0.7–4.0)
MCH: 21.6 pg — ABNORMAL LOW (ref 26.0–34.0)
MCHC: 30.6 g/dL (ref 30.0–36.0)
MCV: 70.6 fL — ABNORMAL LOW (ref 80.0–100.0)
Monocytes Absolute: 0.6 10*3/uL (ref 0.1–1.0)
Monocytes Relative: 7 %
Neutro Abs: 6.6 10*3/uL (ref 1.7–7.7)
Neutrophils Relative %: 77 %
Platelet Count: 121 10*3/uL — ABNORMAL LOW (ref 150–400)
RBC: 3.98 MIL/uL — ABNORMAL LOW (ref 4.22–5.81)
RDW: 18 % — ABNORMAL HIGH (ref 11.5–15.5)
WBC Count: 8.4 10*3/uL (ref 4.0–10.5)
nRBC: 0.4 % — ABNORMAL HIGH (ref 0.0–0.2)

## 2020-11-01 LAB — CMP (CANCER CENTER ONLY)
ALT: 10 U/L (ref 0–44)
AST: 21 U/L (ref 15–41)
Albumin: 3.5 g/dL (ref 3.5–5.0)
Alkaline Phosphatase: 37 U/L — ABNORMAL LOW (ref 38–126)
Anion gap: 6 (ref 5–15)
BUN: 21 mg/dL (ref 8–23)
CO2: 28 mmol/L (ref 22–32)
Calcium: 8.7 mg/dL — ABNORMAL LOW (ref 8.9–10.3)
Chloride: 108 mmol/L (ref 98–111)
Creatinine: 1.45 mg/dL — ABNORMAL HIGH (ref 0.61–1.24)
GFR, Estimated: 48 mL/min — ABNORMAL LOW (ref >60)
Glucose, Bld: 111 mg/dL — ABNORMAL HIGH (ref 70–99)
Potassium: 3.7 mmol/L (ref 3.5–5.1)
Sodium: 142 mmol/L (ref 135–145)
Total Bilirubin: 0.5 mg/dL (ref 0.3–1.2)
Total Protein: 5.7 g/dL — ABNORMAL LOW (ref 6.5–8.1)

## 2020-11-01 LAB — SAMPLE TO BLOOD BANK

## 2020-11-01 MED ORDER — EPOETIN ALFA-EPBX 40000 UNIT/ML IJ SOLN
INTRAMUSCULAR | Status: AC
  Filled 2020-11-01: qty 1

## 2020-11-01 MED ORDER — EPOETIN ALFA-EPBX 40000 UNIT/ML IJ SOLN
40000.0000 [IU] | Freq: Once | INTRAMUSCULAR | Status: AC
  Administered 2020-11-01: 40000 [IU] via SUBCUTANEOUS

## 2020-11-01 NOTE — Patient Instructions (Signed)

## 2020-11-01 NOTE — Telephone Encounter (Signed)
Scheduled appts per 2/28 los. Pt stated he would refer to mychart for AVS and appts.

## 2020-11-19 ENCOUNTER — Other Ambulatory Visit: Payer: Self-pay | Admitting: *Deleted

## 2020-11-19 DIAGNOSIS — D638 Anemia in other chronic diseases classified elsewhere: Secondary | ICD-10-CM

## 2020-11-19 DIAGNOSIS — M0579 Rheumatoid arthritis with rheumatoid factor of multiple sites without organ or systems involvement: Secondary | ICD-10-CM | POA: Diagnosis not present

## 2020-11-19 DIAGNOSIS — Z79899 Other long term (current) drug therapy: Secondary | ICD-10-CM | POA: Diagnosis not present

## 2020-11-22 DIAGNOSIS — N401 Enlarged prostate with lower urinary tract symptoms: Secondary | ICD-10-CM | POA: Diagnosis not present

## 2020-11-22 DIAGNOSIS — Z7189 Other specified counseling: Secondary | ICD-10-CM | POA: Diagnosis not present

## 2020-11-22 DIAGNOSIS — Z1389 Encounter for screening for other disorder: Secondary | ICD-10-CM | POA: Diagnosis not present

## 2020-11-22 DIAGNOSIS — I483 Typical atrial flutter: Secondary | ICD-10-CM | POA: Diagnosis not present

## 2020-11-22 DIAGNOSIS — R5383 Other fatigue: Secondary | ICD-10-CM | POA: Diagnosis not present

## 2020-11-22 DIAGNOSIS — E039 Hypothyroidism, unspecified: Secondary | ICD-10-CM | POA: Diagnosis not present

## 2020-11-22 DIAGNOSIS — I700 Atherosclerosis of aorta: Secondary | ICD-10-CM | POA: Diagnosis not present

## 2020-11-22 DIAGNOSIS — Z0000 Encounter for general adult medical examination without abnormal findings: Secondary | ICD-10-CM | POA: Diagnosis not present

## 2020-11-22 DIAGNOSIS — I5030 Unspecified diastolic (congestive) heart failure: Secondary | ICD-10-CM | POA: Diagnosis not present

## 2020-11-22 DIAGNOSIS — I480 Paroxysmal atrial fibrillation: Secondary | ICD-10-CM | POA: Diagnosis not present

## 2020-11-22 DIAGNOSIS — M069 Rheumatoid arthritis, unspecified: Secondary | ICD-10-CM | POA: Diagnosis not present

## 2020-11-23 ENCOUNTER — Inpatient Hospital Stay: Payer: Medicare PPO | Attending: Hematology and Oncology

## 2020-11-23 ENCOUNTER — Inpatient Hospital Stay: Payer: Medicare PPO

## 2020-11-23 ENCOUNTER — Other Ambulatory Visit: Payer: Self-pay

## 2020-11-23 ENCOUNTER — Other Ambulatory Visit: Payer: Self-pay | Admitting: *Deleted

## 2020-11-23 ENCOUNTER — Encounter: Payer: Self-pay | Admitting: *Deleted

## 2020-11-23 VITALS — BP 116/61 | HR 58 | Temp 98.3°F

## 2020-11-23 DIAGNOSIS — D563 Thalassemia minor: Secondary | ICD-10-CM | POA: Diagnosis not present

## 2020-11-23 DIAGNOSIS — D638 Anemia in other chronic diseases classified elsewhere: Secondary | ICD-10-CM | POA: Diagnosis not present

## 2020-11-23 LAB — CMP (CANCER CENTER ONLY)
ALT: 14 U/L (ref 0–44)
AST: 27 U/L (ref 15–41)
Albumin: 3.3 g/dL — ABNORMAL LOW (ref 3.5–5.0)
Alkaline Phosphatase: 50 U/L (ref 38–126)
Anion gap: 11 (ref 5–15)
BUN: 32 mg/dL — ABNORMAL HIGH (ref 8–23)
CO2: 24 mmol/L (ref 22–32)
Calcium: 7.9 mg/dL — ABNORMAL LOW (ref 8.9–10.3)
Chloride: 108 mmol/L (ref 98–111)
Creatinine: 1.88 mg/dL — ABNORMAL HIGH (ref 0.61–1.24)
GFR, Estimated: 35 mL/min — ABNORMAL LOW (ref >60)
Glucose, Bld: 91 mg/dL (ref 70–99)
Potassium: 4.2 mmol/L (ref 3.5–5.1)
Sodium: 143 mmol/L (ref 135–145)
Total Bilirubin: 0.4 mg/dL (ref 0.3–1.2)
Total Protein: 5.7 g/dL — ABNORMAL LOW (ref 6.5–8.1)

## 2020-11-23 LAB — CBC WITH DIFFERENTIAL (CANCER CENTER ONLY)
Abs Immature Granulocytes: 0.07 10*3/uL (ref 0.00–0.07)
Basophils Absolute: 0 10*3/uL (ref 0.0–0.1)
Basophils Relative: 1 %
Eosinophils Absolute: 0.3 10*3/uL (ref 0.0–0.5)
Eosinophils Relative: 3 %
HCT: 20.6 % — ABNORMAL LOW (ref 39.0–52.0)
Hemoglobin: 6.4 g/dL — CL (ref 13.0–17.0)
Immature Granulocytes: 1 %
Lymphocytes Relative: 6 %
Lymphs Abs: 0.5 10*3/uL — ABNORMAL LOW (ref 0.7–4.0)
MCH: 21.6 pg — ABNORMAL LOW (ref 26.0–34.0)
MCHC: 31.1 g/dL (ref 30.0–36.0)
MCV: 69.6 fL — ABNORMAL LOW (ref 80.0–100.0)
Monocytes Absolute: 0.6 10*3/uL (ref 0.1–1.0)
Monocytes Relative: 7 %
Neutro Abs: 7 10*3/uL (ref 1.7–7.7)
Neutrophils Relative %: 82 %
Platelet Count: 137 10*3/uL — ABNORMAL LOW (ref 150–400)
RBC: 2.96 MIL/uL — ABNORMAL LOW (ref 4.22–5.81)
RDW: 17.8 % — ABNORMAL HIGH (ref 11.5–15.5)
WBC Count: 8.5 10*3/uL (ref 4.0–10.5)
nRBC: 0.4 % — ABNORMAL HIGH (ref 0.0–0.2)

## 2020-11-23 LAB — SAMPLE TO BLOOD BANK

## 2020-11-23 LAB — PREPARE RBC (CROSSMATCH)

## 2020-11-23 MED ORDER — EPOETIN ALFA-EPBX 40000 UNIT/ML IJ SOLN
40000.0000 [IU] | Freq: Once | INTRAMUSCULAR | Status: AC
  Administered 2020-11-23: 40000 [IU] via SUBCUTANEOUS

## 2020-11-23 MED ORDER — EPOETIN ALFA-EPBX 40000 UNIT/ML IJ SOLN
INTRAMUSCULAR | Status: AC
  Filled 2020-11-23: qty 1

## 2020-11-23 NOTE — Progress Notes (Signed)
CRITICAL VALUE STICKER  CRITICAL VALUE: Hgb 6.4  RECEIVER (on-site recipient of call): Joshua Hudson  DATE & TIME NOTIFIED: 11/23/20 at 0800   MD NOTIFIED: Nicholas Lose, MD  RESPONSE: Orders received for pt to be administered 2 units of PRBC's.

## 2020-11-24 ENCOUNTER — Encounter: Payer: Self-pay | Admitting: Hematology and Oncology

## 2020-11-24 ENCOUNTER — Inpatient Hospital Stay: Payer: Medicare PPO

## 2020-11-24 DIAGNOSIS — D638 Anemia in other chronic diseases classified elsewhere: Secondary | ICD-10-CM

## 2020-11-24 DIAGNOSIS — D563 Thalassemia minor: Secondary | ICD-10-CM | POA: Diagnosis not present

## 2020-11-24 MED ORDER — SODIUM CHLORIDE 0.9% IV SOLUTION
250.0000 mL | Freq: Once | INTRAVENOUS | Status: AC
  Administered 2020-11-24: 250 mL via INTRAVENOUS
  Filled 2020-11-24: qty 250

## 2020-11-24 MED ORDER — ACETAMINOPHEN 325 MG PO TABS
650.0000 mg | ORAL_TABLET | Freq: Once | ORAL | Status: AC
  Administered 2020-11-24: 650 mg via ORAL

## 2020-11-24 MED ORDER — DIPHENHYDRAMINE HCL 25 MG PO CAPS
25.0000 mg | ORAL_CAPSULE | Freq: Once | ORAL | Status: AC
  Administered 2020-11-24: 25 mg via ORAL

## 2020-11-24 MED ORDER — DIPHENHYDRAMINE HCL 25 MG PO CAPS
ORAL_CAPSULE | ORAL | Status: AC
  Filled 2020-11-24: qty 1

## 2020-11-24 MED ORDER — ACETAMINOPHEN 325 MG PO TABS
ORAL_TABLET | ORAL | Status: AC
  Filled 2020-11-24: qty 2

## 2020-11-24 NOTE — Patient Instructions (Signed)

## 2020-11-25 ENCOUNTER — Other Ambulatory Visit: Payer: Self-pay | Admitting: Internal Medicine

## 2020-11-25 DIAGNOSIS — H353211 Exudative age-related macular degeneration, right eye, with active choroidal neovascularization: Secondary | ICD-10-CM | POA: Diagnosis not present

## 2020-11-25 LAB — TYPE AND SCREEN
ABO/RH(D): A POS
Antibody Screen: NEGATIVE
Unit division: 0
Unit division: 0

## 2020-11-25 LAB — BPAM RBC
Blood Product Expiration Date: 202204142359
Blood Product Expiration Date: 202204142359
ISSUE DATE / TIME: 202203230919
ISSUE DATE / TIME: 202203230919
Unit Type and Rh: 6200
Unit Type and Rh: 6200

## 2020-11-25 NOTE — Telephone Encounter (Signed)
Prescription refill request for Eliquis received.  Indication: Aflutter  Last office visit: 04/14/2020, [place] Silos Scr: 1.88, 11/23/2020 Age: 86 yo  Weight: 50.6 kg   Pt is on the correct dose of Eliquis per dosing criteria, prescription refill sent for Eliquis 2.5mg  BID.

## 2020-12-10 ENCOUNTER — Other Ambulatory Visit: Payer: Self-pay

## 2020-12-10 DIAGNOSIS — D638 Anemia in other chronic diseases classified elsewhere: Secondary | ICD-10-CM

## 2020-12-12 NOTE — Assessment & Plan Note (Signed)
On 05/06/2019: Hemoglobin 7.6: Blood transfusion given Hemoglobin electrophoresis: Beta thalassemia minor.This is the cause of microcytosis. It is not iron deficiency related.  Possible differential:Anemia of chronic disease versus myelodysplastic syndrome versus methotrexate related toxicity. His methotrexate has been on hold.However his hemoglobin continues to remain low   Hospitalization 09/22/2019-09/25/2019: A. fib with RVR status post cardioversion, currently on Eliquis  Lab review:  09/25/2019: WBC 7.2, hemoglobin 9.6, platelets 145 10/03/19: WBC: 7.5, Hb 8.9, Pl: 135 03/31/2020:WBC 7.9, Hb 9.2,platelets 125 06/24/2020: Hemoglobin 7.4 (received blood transfusion) 07/26/2020: Hemoglobin 7.9 (going to receive blood) 08/09/2020:Hemoglobin 9.2 (Retacritinjection) 09/21/2020: Hemoglobin 8.1 (Retacrit injection) 11/01/2020: Hemoglobin 8.6 (Retacrit injection) 11/23/20: Hb 6.4 (Retacrit and 11/24/20 Blood)  Even though he has microcytic anemia, this microcytosis is related to beta thalassemia. His anemia is due to chronic disease.  Current treatment plan:Retacritinjections, blood transfusions as needed Return to clinic every 3 weeks forRetacritinjection  Every 6 weeks to follow-up with me with labs

## 2020-12-12 NOTE — Progress Notes (Signed)
Patient Care Team: Lavone Orn, MD as PCP - General (Internal Medicine) Sueanne Margarita, MD as PCP - Cardiology (Cardiology) Sueanne Margarita, MD as Consulting Physician (Cardiology)  DIAGNOSIS:    ICD-10-CM   1. Anemia of chronic disease  D63.8     CHIEF COMPLIANT: Follow-up of anemia,thrombocytopenia  INTERVAL HISTORY: Joshua Hudson is a 86 y.o. with above-mentioned history of anemia and thrombocytopeniacurrently on treatment with Aranesp (last 11/23/20). He received a transfusion of 2 units of PRBCs on 11/24/20 after labs showed Hg 6.4. He presents to the clinic todayto review his labs. He reports to be feeling much better after the blood transfusions.  He continues to have chronic fatigue and diffuse bruising.   ALLERGIES:  has No Known Allergies.  MEDICATIONS:  Current Outpatient Medications  Medication Sig Dispense Refill  . amiodarone (PACERONE) 200 MG tablet Take 1 tablet (200 mg total) by mouth daily. Pt needs to make appt with provider for further refills 90 tablet 1  . ELIQUIS 2.5 MG TABS tablet TAKE 1 TABLET(2.5 MG) BY MOUTH TWICE DAILY 180 tablet 1  . finasteride (PROSCAR) 5 MG tablet Take 5 mg by mouth daily as needed (retention).     . folic acid (FOLVITE) 1 MG tablet Take 1 mg by mouth daily.    . furosemide (LASIX) 40 MG tablet Take 1 tablet (40 mg total) by mouth daily as needed. 90 tablet 3  . leflunomide (ARAVA) 20 MG tablet Take 20 mg by mouth daily.    . Multiple Vitamin (MULTIVITAMIN WITH MINERALS) TABS tablet Take 1 tablet by mouth daily.    . pantoprazole (PROTONIX) 40 MG tablet Take 40 mg by mouth daily before breakfast.     . Polyethyl Glycol-Propyl Glycol (LUBRICANT EYE DROPS) 0.4-0.3 % SOLN Place 1 drop into both eyes 3 (three) times daily as needed (dry/irritated eyes.).    Marland Kitchen potassium chloride SA (KLOR-CON) 20 MEQ tablet TAKE 1 TABLET BY MOUTH EVERY DAY 90 tablet 2  . predniSONE (DELTASONE) 5 MG tablet Take 5 mg by mouth daily with breakfast.       No current facility-administered medications for this visit.    PHYSICAL EXAMINATION: ECOG PERFORMANCE STATUS: 1 - Symptomatic but completely ambulatory  Vitals:   12/13/20 0808  BP: (!) 119/58  Pulse: 62  Resp: 20  Temp: 97.9 F (36.6 C)  SpO2: 100%   Filed Weights   12/13/20 0808  Weight: 112 lb 1.6 oz (50.8 kg)    LABORATORY DATA:  I have reviewed the data as listed CMP Latest Ref Rng & Units 11/23/2020 11/01/2020 10/11/2020  Glucose 70 - 99 mg/dL 91 111(H) 88  BUN 8 - 23 mg/dL 32(H) 21 33(H)  Creatinine 0.61 - 1.24 mg/dL 1.88(H) 1.45(H) 2.11(H)  Sodium 135 - 145 mmol/L 143 142 142  Potassium 3.5 - 5.1 mmol/L 4.2 3.7 5.5(H)  Chloride 98 - 111 mmol/L 108 108 105  CO2 22 - 32 mmol/L 24 28 29   Calcium 8.9 - 10.3 mg/dL 7.9(L) 8.7(L) 8.9  Total Protein 6.5 - 8.1 g/dL 5.7(L) 5.7(L) 6.1(L)  Total Bilirubin 0.3 - 1.2 mg/dL 0.4 0.5 0.6  Alkaline Phos 38 - 126 U/L 50 37(L) 43  AST 15 - 41 U/L 27 21 25   ALT 0 - 44 U/L 14 10 14     Lab Results  Component Value Date   WBC 8.7 12/13/2020   HGB 8.2 (L) 12/13/2020   HCT 26.9 (L) 12/13/2020   MCV 72.5 (L) 12/13/2020  PLT 153 12/13/2020   NEUTROABS 6.1 12/13/2020    ASSESSMENT & PLAN:  Anemia of chronic disease On 05/06/2019: Hemoglobin 7.6: Blood transfusion given Hemoglobin electrophoresis: Beta thalassemia minor.This is the cause of microcytosis. It is not iron deficiency related.  Possible differential:Anemia of chronic disease versus myelodysplastic syndrome versus methotrexate related toxicity. His methotrexate has been on hold.However his hemoglobin continues to remain low   Hospitalization 09/22/2019-09/25/2019: A. fib with RVR status post cardioversion, currently on Eliquis  Lab review:  09/25/2019: WBC 7.2, hemoglobin 9.6, platelets 145 10/03/19: WBC: 7.5, Hb 8.9, Pl: 135 03/31/2020:WBC 7.9, Hb 9.2,platelets 125 06/24/2020: Hemoglobin 7.4 (received blood transfusion) 07/26/2020: Hemoglobin 7.9 (blood  transfusion) 08/09/2020:Hemoglobin 9.2 (Retacritinjection) 09/21/2020: Hemoglobin 8.1 (Retacrit injection) 11/01/2020: Hemoglobin 8.6 (Retacrit injection) 11/23/20: Hb 6.4 (Retacrit and 11/24/20 Blood transfusion) 12/13/2020: Hemoglobin 8.2 (Retacrit)   His anemia is due to chronic disease.  Current treatment plan:Retacritinjections, blood transfusions as needed Return to clinic every 3 weeks forRetacritinjection  Every 6 weeks to follow-up with me with labs    No orders of the defined types were placed in this encounter.  The patient has a good understanding of the overall plan. he agrees with it. he will call with any problems that may develop before the next visit here.  Total time spent: 20 mins including face to face time and time spent for planning, charting and coordination of care  [name], MD, MPH 12/13/2020  I, Molly Dorshimer, am acting as scribe for Dr. Nicholas Lose.  I have reviewed the above documentation for accuracy and completeness, and I agree with the above.

## 2020-12-13 ENCOUNTER — Inpatient Hospital Stay: Payer: Medicare PPO

## 2020-12-13 ENCOUNTER — Inpatient Hospital Stay: Payer: Medicare PPO | Attending: Hematology and Oncology

## 2020-12-13 ENCOUNTER — Inpatient Hospital Stay: Payer: Medicare PPO | Admitting: Hematology and Oncology

## 2020-12-13 ENCOUNTER — Other Ambulatory Visit: Payer: Self-pay

## 2020-12-13 VITALS — BP 127/74 | HR 85 | Temp 98.2°F | Resp 18

## 2020-12-13 DIAGNOSIS — M069 Rheumatoid arthritis, unspecified: Secondary | ICD-10-CM | POA: Diagnosis not present

## 2020-12-13 DIAGNOSIS — D563 Thalassemia minor: Secondary | ICD-10-CM | POA: Insufficient documentation

## 2020-12-13 DIAGNOSIS — D638 Anemia in other chronic diseases classified elsewhere: Secondary | ICD-10-CM

## 2020-12-13 DIAGNOSIS — D696 Thrombocytopenia, unspecified: Secondary | ICD-10-CM | POA: Diagnosis not present

## 2020-12-13 LAB — CMP (CANCER CENTER ONLY)
ALT: 15 U/L (ref 0–44)
AST: 29 U/L (ref 15–41)
Albumin: 3.4 g/dL — ABNORMAL LOW (ref 3.5–5.0)
Alkaline Phosphatase: 45 U/L (ref 38–126)
Anion gap: 11 (ref 5–15)
BUN: 27 mg/dL — ABNORMAL HIGH (ref 8–23)
CO2: 28 mmol/L (ref 22–32)
Calcium: 8.8 mg/dL — ABNORMAL LOW (ref 8.9–10.3)
Chloride: 104 mmol/L (ref 98–111)
Creatinine: 2 mg/dL — ABNORMAL HIGH (ref 0.61–1.24)
GFR, Estimated: 32 mL/min — ABNORMAL LOW (ref >60)
Glucose, Bld: 75 mg/dL (ref 70–99)
Potassium: 3.7 mmol/L (ref 3.5–5.1)
Sodium: 143 mmol/L (ref 135–145)
Total Bilirubin: 0.5 mg/dL (ref 0.3–1.2)
Total Protein: 5.9 g/dL — ABNORMAL LOW (ref 6.5–8.1)

## 2020-12-13 LAB — CBC WITH DIFFERENTIAL (CANCER CENTER ONLY)
Abs Immature Granulocytes: 0.04 10*3/uL (ref 0.00–0.07)
Basophils Absolute: 0.1 10*3/uL (ref 0.0–0.1)
Basophils Relative: 1 %
Eosinophils Absolute: 0.5 10*3/uL (ref 0.0–0.5)
Eosinophils Relative: 5 %
HCT: 26.9 % — ABNORMAL LOW (ref 39.0–52.0)
Hemoglobin: 8.2 g/dL — ABNORMAL LOW (ref 13.0–17.0)
Immature Granulocytes: 1 %
Lymphocytes Relative: 13 %
Lymphs Abs: 1.1 10*3/uL (ref 0.7–4.0)
MCH: 22.1 pg — ABNORMAL LOW (ref 26.0–34.0)
MCHC: 30.5 g/dL (ref 30.0–36.0)
MCV: 72.5 fL — ABNORMAL LOW (ref 80.0–100.0)
Monocytes Absolute: 1 10*3/uL (ref 0.1–1.0)
Monocytes Relative: 11 %
Neutro Abs: 6.1 10*3/uL (ref 1.7–7.7)
Neutrophils Relative %: 69 %
Platelet Count: 153 10*3/uL (ref 150–400)
RBC: 3.71 MIL/uL — ABNORMAL LOW (ref 4.22–5.81)
RDW: 19.9 % — ABNORMAL HIGH (ref 11.5–15.5)
WBC Count: 8.7 10*3/uL (ref 4.0–10.5)
nRBC: 0.2 % (ref 0.0–0.2)

## 2020-12-13 LAB — SAMPLE TO BLOOD BANK

## 2020-12-13 MED ORDER — EPOETIN ALFA-EPBX 40000 UNIT/ML IJ SOLN
INTRAMUSCULAR | Status: AC
  Filled 2020-12-13: qty 1

## 2020-12-13 MED ORDER — EPOETIN ALFA-EPBX 40000 UNIT/ML IJ SOLN
40000.0000 [IU] | Freq: Once | INTRAMUSCULAR | Status: AC
  Administered 2020-12-13: 40000 [IU] via SUBCUTANEOUS

## 2020-12-29 DIAGNOSIS — H353211 Exudative age-related macular degeneration, right eye, with active choroidal neovascularization: Secondary | ICD-10-CM | POA: Diagnosis not present

## 2020-12-30 DIAGNOSIS — I480 Paroxysmal atrial fibrillation: Secondary | ICD-10-CM | POA: Diagnosis not present

## 2020-12-30 DIAGNOSIS — I5040 Unspecified combined systolic (congestive) and diastolic (congestive) heart failure: Secondary | ICD-10-CM | POA: Diagnosis not present

## 2020-12-30 DIAGNOSIS — R609 Edema, unspecified: Secondary | ICD-10-CM | POA: Diagnosis not present

## 2020-12-30 DIAGNOSIS — E032 Hypothyroidism due to medicaments and other exogenous substances: Secondary | ICD-10-CM | POA: Diagnosis not present

## 2020-12-31 ENCOUNTER — Other Ambulatory Visit: Payer: Self-pay | Admitting: *Deleted

## 2020-12-31 DIAGNOSIS — D638 Anemia in other chronic diseases classified elsewhere: Secondary | ICD-10-CM

## 2021-01-03 ENCOUNTER — Inpatient Hospital Stay: Payer: Medicare PPO

## 2021-01-03 ENCOUNTER — Other Ambulatory Visit: Payer: Self-pay

## 2021-01-03 ENCOUNTER — Inpatient Hospital Stay: Payer: Medicare PPO | Attending: Hematology and Oncology

## 2021-01-03 VITALS — BP 121/61 | HR 55 | Temp 98.4°F | Resp 18

## 2021-01-03 DIAGNOSIS — D563 Thalassemia minor: Secondary | ICD-10-CM | POA: Diagnosis not present

## 2021-01-03 DIAGNOSIS — D638 Anemia in other chronic diseases classified elsewhere: Secondary | ICD-10-CM | POA: Insufficient documentation

## 2021-01-03 DIAGNOSIS — Z7901 Long term (current) use of anticoagulants: Secondary | ICD-10-CM | POA: Diagnosis not present

## 2021-01-03 DIAGNOSIS — D696 Thrombocytopenia, unspecified: Secondary | ICD-10-CM | POA: Insufficient documentation

## 2021-01-03 DIAGNOSIS — I4891 Unspecified atrial fibrillation: Secondary | ICD-10-CM | POA: Diagnosis not present

## 2021-01-03 LAB — CMP (CANCER CENTER ONLY)
ALT: 11 U/L (ref 0–44)
AST: 27 U/L (ref 15–41)
Albumin: 3.3 g/dL — ABNORMAL LOW (ref 3.5–5.0)
Alkaline Phosphatase: 48 U/L (ref 38–126)
Anion gap: 9 (ref 5–15)
BUN: 31 mg/dL — ABNORMAL HIGH (ref 8–23)
CO2: 30 mmol/L (ref 22–32)
Calcium: 8.7 mg/dL — ABNORMAL LOW (ref 8.9–10.3)
Chloride: 103 mmol/L (ref 98–111)
Creatinine: 1.87 mg/dL — ABNORMAL HIGH (ref 0.61–1.24)
GFR, Estimated: 35 mL/min — ABNORMAL LOW (ref >60)
Glucose, Bld: 91 mg/dL (ref 70–99)
Potassium: 4.3 mmol/L (ref 3.5–5.1)
Sodium: 142 mmol/L (ref 135–145)
Total Bilirubin: 0.5 mg/dL (ref 0.3–1.2)
Total Protein: 5.8 g/dL — ABNORMAL LOW (ref 6.5–8.1)

## 2021-01-03 LAB — CBC WITH DIFFERENTIAL (CANCER CENTER ONLY)
Abs Immature Granulocytes: 0.04 10*3/uL (ref 0.00–0.07)
Basophils Absolute: 0 10*3/uL (ref 0.0–0.1)
Basophils Relative: 1 %
Eosinophils Absolute: 0.4 10*3/uL (ref 0.0–0.5)
Eosinophils Relative: 5 %
HCT: 25.4 % — ABNORMAL LOW (ref 39.0–52.0)
Hemoglobin: 7.8 g/dL — ABNORMAL LOW (ref 13.0–17.0)
Immature Granulocytes: 1 %
Lymphocytes Relative: 5 %
Lymphs Abs: 0.3 10*3/uL — ABNORMAL LOW (ref 0.7–4.0)
MCH: 22.2 pg — ABNORMAL LOW (ref 26.0–34.0)
MCHC: 30.7 g/dL (ref 30.0–36.0)
MCV: 72.4 fL — ABNORMAL LOW (ref 80.0–100.0)
Monocytes Absolute: 0.8 10*3/uL (ref 0.1–1.0)
Monocytes Relative: 11 %
Neutro Abs: 5.5 10*3/uL (ref 1.7–7.7)
Neutrophils Relative %: 77 %
Platelet Count: 120 10*3/uL — ABNORMAL LOW (ref 150–400)
RBC: 3.51 MIL/uL — ABNORMAL LOW (ref 4.22–5.81)
RDW: 18.6 % — ABNORMAL HIGH (ref 11.5–15.5)
WBC Count: 7 10*3/uL (ref 4.0–10.5)
nRBC: 0.3 % — ABNORMAL HIGH (ref 0.0–0.2)

## 2021-01-03 LAB — SAMPLE TO BLOOD BANK

## 2021-01-03 MED ORDER — EPOETIN ALFA-EPBX 40000 UNIT/ML IJ SOLN
40000.0000 [IU] | Freq: Once | INTRAMUSCULAR | Status: AC
  Administered 2021-01-03: 40000 [IU] via SUBCUTANEOUS

## 2021-01-03 MED ORDER — EPOETIN ALFA-EPBX 40000 UNIT/ML IJ SOLN
INTRAMUSCULAR | Status: AC
  Filled 2021-01-03: qty 1

## 2021-01-03 NOTE — Progress Notes (Signed)
Patient hemoglobin today 7.8. Dr. Lindi Adie notified. Giving retacrit injection today.

## 2021-01-03 NOTE — Patient Instructions (Signed)

## 2021-01-11 DIAGNOSIS — G459 Transient cerebral ischemic attack, unspecified: Secondary | ICD-10-CM | POA: Diagnosis not present

## 2021-01-11 DIAGNOSIS — M810 Age-related osteoporosis without current pathological fracture: Secondary | ICD-10-CM | POA: Diagnosis not present

## 2021-01-11 DIAGNOSIS — Z681 Body mass index (BMI) 19 or less, adult: Secondary | ICD-10-CM | POA: Diagnosis not present

## 2021-01-11 DIAGNOSIS — M2550 Pain in unspecified joint: Secondary | ICD-10-CM | POA: Diagnosis not present

## 2021-01-11 DIAGNOSIS — M0579 Rheumatoid arthritis with rheumatoid factor of multiple sites without organ or systems involvement: Secondary | ICD-10-CM | POA: Diagnosis not present

## 2021-01-11 DIAGNOSIS — D561 Beta thalassemia: Secondary | ICD-10-CM | POA: Diagnosis not present

## 2021-01-11 DIAGNOSIS — D638 Anemia in other chronic diseases classified elsewhere: Secondary | ICD-10-CM | POA: Diagnosis not present

## 2021-01-11 DIAGNOSIS — Z79899 Other long term (current) drug therapy: Secondary | ICD-10-CM | POA: Diagnosis not present

## 2021-01-12 DIAGNOSIS — Z5181 Encounter for therapeutic drug level monitoring: Secondary | ICD-10-CM | POA: Diagnosis not present

## 2021-01-12 DIAGNOSIS — I5040 Unspecified combined systolic (congestive) and diastolic (congestive) heart failure: Secondary | ICD-10-CM | POA: Diagnosis not present

## 2021-01-12 DIAGNOSIS — E039 Hypothyroidism, unspecified: Secondary | ICD-10-CM | POA: Diagnosis not present

## 2021-01-21 ENCOUNTER — Other Ambulatory Visit: Payer: Self-pay

## 2021-01-21 DIAGNOSIS — D696 Thrombocytopenia, unspecified: Secondary | ICD-10-CM

## 2021-01-21 DIAGNOSIS — D638 Anemia in other chronic diseases classified elsewhere: Secondary | ICD-10-CM

## 2021-01-23 NOTE — Assessment & Plan Note (Signed)
On 05/06/2019: Hemoglobin 7.6: Blood transfusion given Hemoglobin electrophoresis: Beta thalassemia minor.This is the cause of microcytosis. It is not iron deficiency related.  Possible differential:Anemia of chronic disease versus myelodysplastic syndrome versus methotrexate related toxicity. His methotrexate has been on hold.However his hemoglobin continues to remain low   Hospitalization 09/22/2019-09/25/2019: A. fib with RVR status post cardioversion, currently on Eliquis  Lab review:  09/25/2019: WBC 7.2, hemoglobin 9.6, platelets 145 10/03/19: WBC: 7.5, Hb 8.9, Pl: 135 03/31/2020:WBC 7.9, Hb 9.2,platelets 125 06/24/2020: Hemoglobin 7.4 (received blood transfusion) 07/26/2020: Hemoglobin 7.9 (blood transfusion) 08/09/2020:Hemoglobin 9.2 (Retacritinjection) 09/21/2020: Hemoglobin 8.1 (Retacrit injection) 11/01/2020:Hemoglobin 8.6 (Retacrit injection) 11/23/20: Hb 6.4 (Retacrit and 11/24/20 Blood transfusion) 12/13/2020: Hemoglobin 8.2 (Retacrit) 01/24/21: Hemoglobin   His anemia is due to chronic disease.  Current treatment plan:Retacritinjections, blood transfusions as needed Return to clinic every 3 weeks forRetacritinjection  Every 6 weeks to follow-up with me with labs

## 2021-01-24 ENCOUNTER — Inpatient Hospital Stay: Payer: Medicare PPO

## 2021-01-24 ENCOUNTER — Other Ambulatory Visit: Payer: Self-pay

## 2021-01-24 ENCOUNTER — Inpatient Hospital Stay: Payer: Medicare PPO | Admitting: Hematology and Oncology

## 2021-01-24 ENCOUNTER — Other Ambulatory Visit: Payer: Self-pay | Admitting: *Deleted

## 2021-01-24 ENCOUNTER — Encounter: Payer: Self-pay | Admitting: Hematology and Oncology

## 2021-01-24 ENCOUNTER — Ambulatory Visit: Payer: Medicare PPO

## 2021-01-24 DIAGNOSIS — D638 Anemia in other chronic diseases classified elsewhere: Secondary | ICD-10-CM

## 2021-01-24 DIAGNOSIS — D561 Beta thalassemia: Secondary | ICD-10-CM | POA: Diagnosis not present

## 2021-01-24 DIAGNOSIS — D696 Thrombocytopenia, unspecified: Secondary | ICD-10-CM

## 2021-01-24 DIAGNOSIS — Z7901 Long term (current) use of anticoagulants: Secondary | ICD-10-CM | POA: Diagnosis not present

## 2021-01-24 DIAGNOSIS — D563 Thalassemia minor: Secondary | ICD-10-CM | POA: Diagnosis not present

## 2021-01-24 DIAGNOSIS — I4891 Unspecified atrial fibrillation: Secondary | ICD-10-CM | POA: Diagnosis not present

## 2021-01-24 LAB — SAMPLE TO BLOOD BANK

## 2021-01-24 LAB — CBC WITH DIFFERENTIAL (CANCER CENTER ONLY)
Abs Immature Granulocytes: 0.06 10*3/uL (ref 0.00–0.07)
Basophils Absolute: 0.1 10*3/uL (ref 0.0–0.1)
Basophils Relative: 1 %
Eosinophils Absolute: 0.2 10*3/uL (ref 0.0–0.5)
Eosinophils Relative: 3 %
HCT: 23.6 % — ABNORMAL LOW (ref 39.0–52.0)
Hemoglobin: 7.3 g/dL — ABNORMAL LOW (ref 13.0–17.0)
Immature Granulocytes: 1 %
Lymphocytes Relative: 8 %
Lymphs Abs: 0.6 10*3/uL — ABNORMAL LOW (ref 0.7–4.0)
MCH: 21.3 pg — ABNORMAL LOW (ref 26.0–34.0)
MCHC: 30.9 g/dL (ref 30.0–36.0)
MCV: 68.8 fL — ABNORMAL LOW (ref 80.0–100.0)
Monocytes Absolute: 0.5 10*3/uL (ref 0.1–1.0)
Monocytes Relative: 6 %
Neutro Abs: 6 10*3/uL (ref 1.7–7.7)
Neutrophils Relative %: 81 %
Platelet Count: 129 10*3/uL — ABNORMAL LOW (ref 150–400)
RBC: 3.43 MIL/uL — ABNORMAL LOW (ref 4.22–5.81)
RDW: 18.6 % — ABNORMAL HIGH (ref 11.5–15.5)
WBC Count: 7.3 10*3/uL (ref 4.0–10.5)
nRBC: 0.3 % — ABNORMAL HIGH (ref 0.0–0.2)

## 2021-01-24 LAB — CMP (CANCER CENTER ONLY)
ALT: 12 U/L (ref 0–44)
AST: 30 U/L (ref 15–41)
Albumin: 3.3 g/dL — ABNORMAL LOW (ref 3.5–5.0)
Alkaline Phosphatase: 51 U/L (ref 38–126)
Anion gap: 10 (ref 5–15)
BUN: 32 mg/dL — ABNORMAL HIGH (ref 8–23)
CO2: 28 mmol/L (ref 22–32)
Calcium: 9 mg/dL (ref 8.9–10.3)
Chloride: 105 mmol/L (ref 98–111)
Creatinine: 1.97 mg/dL — ABNORMAL HIGH (ref 0.61–1.24)
GFR, Estimated: 33 mL/min — ABNORMAL LOW (ref >60)
Glucose, Bld: 90 mg/dL (ref 70–99)
Potassium: 4.8 mmol/L (ref 3.5–5.1)
Sodium: 143 mmol/L (ref 135–145)
Total Bilirubin: 0.5 mg/dL (ref 0.3–1.2)
Total Protein: 5.8 g/dL — ABNORMAL LOW (ref 6.5–8.1)

## 2021-01-24 MED ORDER — EPOETIN ALFA-EPBX 40000 UNIT/ML IJ SOLN
INTRAMUSCULAR | Status: AC
  Filled 2021-01-24: qty 1

## 2021-01-24 MED ORDER — EPOETIN ALFA-EPBX 40000 UNIT/ML IJ SOLN
40000.0000 [IU] | Freq: Once | INTRAMUSCULAR | Status: AC
  Administered 2021-01-24: 40000 [IU] via SUBCUTANEOUS

## 2021-01-24 NOTE — Progress Notes (Signed)
Pt Hgb 7.3.  Per MD pt to receive Retacrit today and 1 unit of PRBC's this week.  Orders placed and high priority message sent to scheduling.

## 2021-01-24 NOTE — Progress Notes (Signed)
Patient Care Team: Lavone Orn, MD as PCP - General (Internal Medicine) Sueanne Margarita, MD as PCP - Cardiology (Cardiology) Sueanne Margarita, MD as Consulting Physician (Cardiology)  DIAGNOSIS:    ICD-10-CM   1. Anemia of chronic disease  D63.8     CHIEF COMPLIANT: Follow-up of anemia,thrombocytopenia  INTERVAL HISTORY: Joshua Hudson is a 86 y.o. with above-mentioned history of anemia and thrombocytopeniacurrently on treatment with Aranesp (last 01/03/21). He presents to the clinic todayto review his labs.  He is complaining of profound dizziness lightheadedness instability in the gait.  He is also completely bedridden and does not do much activities because of how weak he feels.  He thinks it started about 3 weeks ago.  This is approximately when his hemoglobin starts to go below 7.5.  ALLERGIES:  has No Known Allergies.  MEDICATIONS:  Current Outpatient Medications  Medication Sig Dispense Refill  . amiodarone (PACERONE) 200 MG tablet Take 1 tablet (200 mg total) by mouth daily. Pt needs to make appt with provider for further refills 90 tablet 1  . ELIQUIS 2.5 MG TABS tablet TAKE 1 TABLET(2.5 MG) BY MOUTH TWICE DAILY 180 tablet 1  . finasteride (PROSCAR) 5 MG tablet Take 5 mg by mouth daily as needed (retention).     . folic acid (FOLVITE) 1 MG tablet Take 1 mg by mouth daily.    . furosemide (LASIX) 40 MG tablet Take 1 tablet (40 mg total) by mouth daily as needed. 90 tablet 3  . leflunomide (ARAVA) 20 MG tablet Take 20 mg by mouth daily.    . Multiple Vitamin (MULTIVITAMIN WITH MINERALS) TABS tablet Take 1 tablet by mouth daily.    . pantoprazole (PROTONIX) 40 MG tablet Take 40 mg by mouth daily before breakfast.     . Polyethyl Glycol-Propyl Glycol (LUBRICANT EYE DROPS) 0.4-0.3 % SOLN Place 1 drop into both eyes 3 (three) times daily as needed (dry/irritated eyes.).    Marland Kitchen potassium chloride SA (KLOR-CON) 20 MEQ tablet TAKE 1 TABLET BY MOUTH EVERY DAY 90 tablet 2  .  predniSONE (DELTASONE) 5 MG tablet Take 5 mg by mouth daily with breakfast.      No current facility-administered medications for this visit.    PHYSICAL EXAMINATION: ECOG PERFORMANCE STATUS: 3 - Symptomatic, >50% confined to bed  There were no vitals filed for this visit. There were no vitals filed for this visit.  LABORATORY DATA:  I have reviewed the data as listed CMP Latest Ref Rng & Units 01/03/2021 12/13/2020 11/23/2020  Glucose 70 - 99 mg/dL 91 75 91  BUN 8 - 23 mg/dL 31(H) 27(H) 32(H)  Creatinine 0.61 - 1.24 mg/dL 1.87(H) 2.00(H) 1.88(H)  Sodium 135 - 145 mmol/L 142 143 143  Potassium 3.5 - 5.1 mmol/L 4.3 3.7 4.2  Chloride 98 - 111 mmol/L 103 104 108  CO2 22 - 32 mmol/L 30 28 24   Calcium 8.9 - 10.3 mg/dL 8.7(L) 8.8(L) 7.9(L)  Total Protein 6.5 - 8.1 g/dL 5.8(L) 5.9(L) 5.7(L)  Total Bilirubin 0.3 - 1.2 mg/dL 0.5 0.5 0.4  Alkaline Phos 38 - 126 U/L 48 45 50  AST 15 - 41 U/L 27 29 27   ALT 0 - 44 U/L 11 15 14     Lab Results  Component Value Date   WBC 7.3 01/24/2021   HGB 7.3 (L) 01/24/2021   HCT 23.6 (L) 01/24/2021   MCV 68.8 (L) 01/24/2021   PLT 129 (L) 01/24/2021   NEUTROABS 6.0 01/24/2021  ASSESSMENT & PLAN:  Anemia of chronic disease On 05/06/2019: Hemoglobin 7.6: Blood transfusion given Hemoglobin electrophoresis: Beta thalassemia minor.This is the cause of microcytosis. It is not iron deficiency related.  Possible differential:Anemia of chronic disease versus myelodysplastic syndrome versus methotrexate related toxicity. His methotrexate has been on hold.However his hemoglobin continues to remain low   Hospitalization 09/22/2019-09/25/2019: A. fib with RVR status post cardioversion, currently on Eliquis  Lab review:  09/25/2019: WBC 7.2, hemoglobin 9.6, platelets 145 10/03/19: WBC: 7.5, Hb 8.9, Pl: 135 03/31/2020:WBC 7.9, Hb 9.2,platelets 125 06/24/2020: Hemoglobin 7.4 (received blood transfusion) 07/26/2020: Hemoglobin 7.9 (blood  transfusion) 08/09/2020:Hemoglobin 9.2 (Retacritinjection) 09/21/2020: Hemoglobin 8.1 (Retacrit injection) 11/01/2020:Hemoglobin 8.6 (Retacrit injection) 11/23/20: Hb 6.4 (Retacrit and 11/24/20 Blood transfusion) 12/13/2020: Hemoglobin 8.2 (Retacrit) 01/24/21: Hemoglobin 7.3 (Retacrit and blood)   His anemia is due to chronic disease.  Current treatment plan:Retacritinjections, blood transfusions as needed (when hemoglobin drops below 7.5) Return to clinic every 3 weeks forRetacritinjection  Every 6 weeks to follow-up with me with labs    No orders of the defined types were placed in this encounter.  The patient has a good understanding of the overall plan. he agrees with it. he will call with any problems that may develop before the next visit here.  Total time spent: 20 mins including face to face time and time spent for planning, charting and coordination of care  [name], MD, MPH 01/24/2021  I, Molly Dorshimer, am acting as scribe for Dr. Nicholas Lose.  I have reviewed the above documentation for accuracy and completeness, and I agree with the above.

## 2021-01-25 ENCOUNTER — Inpatient Hospital Stay: Payer: Medicare PPO

## 2021-01-25 ENCOUNTER — Telehealth: Payer: Self-pay | Admitting: Hematology and Oncology

## 2021-01-25 DIAGNOSIS — D563 Thalassemia minor: Secondary | ICD-10-CM | POA: Diagnosis not present

## 2021-01-25 DIAGNOSIS — Z7901 Long term (current) use of anticoagulants: Secondary | ICD-10-CM | POA: Diagnosis not present

## 2021-01-25 DIAGNOSIS — D638 Anemia in other chronic diseases classified elsewhere: Secondary | ICD-10-CM

## 2021-01-25 DIAGNOSIS — D696 Thrombocytopenia, unspecified: Secondary | ICD-10-CM | POA: Diagnosis not present

## 2021-01-25 DIAGNOSIS — I4891 Unspecified atrial fibrillation: Secondary | ICD-10-CM | POA: Diagnosis not present

## 2021-01-25 LAB — PREPARE RBC (CROSSMATCH)

## 2021-01-25 MED ORDER — DIPHENHYDRAMINE HCL 25 MG PO CAPS
25.0000 mg | ORAL_CAPSULE | Freq: Once | ORAL | Status: AC
  Administered 2021-01-25: 25 mg via ORAL

## 2021-01-25 MED ORDER — ACETAMINOPHEN 325 MG PO TABS
ORAL_TABLET | ORAL | Status: AC
  Filled 2021-01-25: qty 2

## 2021-01-25 MED ORDER — SODIUM CHLORIDE 0.9% IV SOLUTION
250.0000 mL | Freq: Once | INTRAVENOUS | Status: AC
  Administered 2021-01-25: 250 mL via INTRAVENOUS
  Filled 2021-01-25: qty 250

## 2021-01-25 MED ORDER — DIPHENHYDRAMINE HCL 25 MG PO CAPS
ORAL_CAPSULE | ORAL | Status: AC
  Filled 2021-01-25: qty 1

## 2021-01-25 MED ORDER — ACETAMINOPHEN 325 MG PO TABS
650.0000 mg | ORAL_TABLET | Freq: Once | ORAL | Status: AC
  Administered 2021-01-25: 650 mg via ORAL

## 2021-01-25 NOTE — Telephone Encounter (Signed)
Scheduled appt per 5/23 sch msg. Pt's daughter is aware. She said they would be here in about 20 mins.

## 2021-01-25 NOTE — Telephone Encounter (Signed)
Scheduled appointments per 05/23 los. Patient will receive calender.  

## 2021-01-25 NOTE — Patient Instructions (Signed)

## 2021-01-26 DIAGNOSIS — N401 Enlarged prostate with lower urinary tract symptoms: Secondary | ICD-10-CM | POA: Diagnosis not present

## 2021-01-26 DIAGNOSIS — I480 Paroxysmal atrial fibrillation: Secondary | ICD-10-CM | POA: Diagnosis not present

## 2021-01-26 DIAGNOSIS — E039 Hypothyroidism, unspecified: Secondary | ICD-10-CM | POA: Diagnosis not present

## 2021-01-26 DIAGNOSIS — K219 Gastro-esophageal reflux disease without esophagitis: Secondary | ICD-10-CM | POA: Diagnosis not present

## 2021-01-26 DIAGNOSIS — J479 Bronchiectasis, uncomplicated: Secondary | ICD-10-CM | POA: Diagnosis not present

## 2021-01-26 DIAGNOSIS — I5030 Unspecified diastolic (congestive) heart failure: Secondary | ICD-10-CM | POA: Diagnosis not present

## 2021-01-26 DIAGNOSIS — J439 Emphysema, unspecified: Secondary | ICD-10-CM | POA: Diagnosis not present

## 2021-01-26 DIAGNOSIS — M069 Rheumatoid arthritis, unspecified: Secondary | ICD-10-CM | POA: Diagnosis not present

## 2021-01-26 DIAGNOSIS — D509 Iron deficiency anemia, unspecified: Secondary | ICD-10-CM | POA: Diagnosis not present

## 2021-01-26 LAB — TYPE AND SCREEN
ABO/RH(D): A POS
Antibody Screen: NEGATIVE
Unit division: 0

## 2021-01-26 LAB — BPAM RBC
Blood Product Expiration Date: 202206192359
ISSUE DATE / TIME: 202205241145
Unit Type and Rh: 6200

## 2021-02-02 DIAGNOSIS — H353211 Exudative age-related macular degeneration, right eye, with active choroidal neovascularization: Secondary | ICD-10-CM | POA: Diagnosis not present

## 2021-02-08 DIAGNOSIS — H353122 Nonexudative age-related macular degeneration, left eye, intermediate dry stage: Secondary | ICD-10-CM | POA: Diagnosis not present

## 2021-02-08 DIAGNOSIS — H35363 Drusen (degenerative) of macula, bilateral: Secondary | ICD-10-CM | POA: Diagnosis not present

## 2021-02-08 DIAGNOSIS — H35453 Secondary pigmentary degeneration, bilateral: Secondary | ICD-10-CM | POA: Diagnosis not present

## 2021-02-08 DIAGNOSIS — H353211 Exudative age-related macular degeneration, right eye, with active choroidal neovascularization: Secondary | ICD-10-CM | POA: Diagnosis not present

## 2021-02-08 DIAGNOSIS — H35372 Puckering of macula, left eye: Secondary | ICD-10-CM | POA: Diagnosis not present

## 2021-02-08 DIAGNOSIS — H43391 Other vitreous opacities, right eye: Secondary | ICD-10-CM | POA: Diagnosis not present

## 2021-02-08 DIAGNOSIS — H43813 Vitreous degeneration, bilateral: Secondary | ICD-10-CM | POA: Diagnosis not present

## 2021-02-14 ENCOUNTER — Inpatient Hospital Stay: Payer: Medicare PPO | Attending: Hematology and Oncology

## 2021-02-14 ENCOUNTER — Inpatient Hospital Stay: Payer: Medicare PPO

## 2021-02-14 ENCOUNTER — Other Ambulatory Visit: Payer: Self-pay

## 2021-02-14 VITALS — BP 116/74 | HR 55 | Temp 98.2°F | Resp 18

## 2021-02-14 DIAGNOSIS — D638 Anemia in other chronic diseases classified elsewhere: Secondary | ICD-10-CM | POA: Insufficient documentation

## 2021-02-14 DIAGNOSIS — D563 Thalassemia minor: Secondary | ICD-10-CM | POA: Diagnosis not present

## 2021-02-14 DIAGNOSIS — D696 Thrombocytopenia, unspecified: Secondary | ICD-10-CM

## 2021-02-14 LAB — CMP (CANCER CENTER ONLY)
ALT: 13 U/L (ref 0–44)
AST: 29 U/L (ref 15–41)
Albumin: 3.4 g/dL — ABNORMAL LOW (ref 3.5–5.0)
Alkaline Phosphatase: 52 U/L (ref 38–126)
Anion gap: 12 (ref 5–15)
BUN: 29 mg/dL — ABNORMAL HIGH (ref 8–23)
CO2: 26 mmol/L (ref 22–32)
Calcium: 8.7 mg/dL — ABNORMAL LOW (ref 8.9–10.3)
Chloride: 104 mmol/L (ref 98–111)
Creatinine: 1.76 mg/dL — ABNORMAL HIGH (ref 0.61–1.24)
GFR, Estimated: 38 mL/min — ABNORMAL LOW (ref >60)
Glucose, Bld: 112 mg/dL — ABNORMAL HIGH (ref 70–99)
Potassium: 3.5 mmol/L (ref 3.5–5.1)
Sodium: 142 mmol/L (ref 135–145)
Total Bilirubin: 0.5 mg/dL (ref 0.3–1.2)
Total Protein: 5.7 g/dL — ABNORMAL LOW (ref 6.5–8.1)

## 2021-02-14 LAB — CBC WITH DIFFERENTIAL (CANCER CENTER ONLY)
Abs Immature Granulocytes: 0.09 10*3/uL — ABNORMAL HIGH (ref 0.00–0.07)
Basophils Absolute: 0 10*3/uL (ref 0.0–0.1)
Basophils Relative: 1 %
Eosinophils Absolute: 0.3 10*3/uL (ref 0.0–0.5)
Eosinophils Relative: 5 %
HCT: 27.3 % — ABNORMAL LOW (ref 39.0–52.0)
Hemoglobin: 8.7 g/dL — ABNORMAL LOW (ref 13.0–17.0)
Immature Granulocytes: 1 %
Lymphocytes Relative: 10 %
Lymphs Abs: 0.6 10*3/uL — ABNORMAL LOW (ref 0.7–4.0)
MCH: 22.1 pg — ABNORMAL LOW (ref 26.0–34.0)
MCHC: 31.9 g/dL (ref 30.0–36.0)
MCV: 69.5 fL — ABNORMAL LOW (ref 80.0–100.0)
Monocytes Absolute: 0.5 10*3/uL (ref 0.1–1.0)
Monocytes Relative: 8 %
Neutro Abs: 4.9 10*3/uL (ref 1.7–7.7)
Neutrophils Relative %: 75 %
Platelet Count: 128 10*3/uL — ABNORMAL LOW (ref 150–400)
RBC: 3.93 MIL/uL — ABNORMAL LOW (ref 4.22–5.81)
RDW: 21.2 % — ABNORMAL HIGH (ref 11.5–15.5)
WBC Count: 6.5 10*3/uL (ref 4.0–10.5)
nRBC: 0 % (ref 0.0–0.2)

## 2021-02-14 LAB — IRON AND TIBC
Iron: 100 ug/dL (ref 42–163)
Saturation Ratios: 41 % (ref 20–55)
TIBC: 241 ug/dL (ref 202–409)
UIBC: 141 ug/dL (ref 117–376)

## 2021-02-14 LAB — SAMPLE TO BLOOD BANK

## 2021-02-14 LAB — FERRITIN: Ferritin: 113 ng/mL (ref 24–336)

## 2021-02-14 MED ORDER — EPOETIN ALFA-EPBX 40000 UNIT/ML IJ SOLN
INTRAMUSCULAR | Status: AC
  Filled 2021-02-14: qty 1

## 2021-02-14 MED ORDER — EPOETIN ALFA-EPBX 40000 UNIT/ML IJ SOLN
40000.0000 [IU] | Freq: Once | INTRAMUSCULAR | Status: AC
  Administered 2021-02-14: 40000 [IU] via SUBCUTANEOUS

## 2021-02-14 NOTE — Patient Instructions (Signed)
Epoetin Alfa injection What is this medication? EPOETIN ALFA (e POE e tin AL fa) helps your body make more red blood cells. This medicine is used to treat anemia caused by chronic kidney disease, cancer chemotherapy, or HIV-therapy. It may also be used before surgery if you have anemia. This medicine may be used for other purposes; ask your health care provider or pharmacist if you have questions. COMMON BRAND NAME(S): Epogen, Procrit, Retacrit What should I tell my care team before I take this medication? They need to know if you have any of these conditions: cancer heart disease high blood pressure history of blood clots history of stroke low levels of folate, iron, or vitamin B12 in the blood seizures an unusual or allergic reaction to erythropoietin, albumin, benzyl alcohol, hamster proteins, other medicines, foods, dyes, or preservatives pregnant or trying to get pregnant breast-feeding How should I use this medication? This medicine is for injection into a vein or under the skin. It is usually given by a health care professional in a hospital or clinic setting. If you get this medicine at home, you will be taught how to prepare and give this medicine. Use exactly as directed. Take your medicine at regular intervals. Do not take your medicine more often than directed. It is important that you put your used needles and syringes in a special sharps container. Do not put them in a trash can. If you do not have a sharps container, call your pharmacist or healthcare provider to get one. A special MedGuide will be given to you by the pharmacist with each prescription and refill. Be sure to read this information carefully each time. Talk to your pediatrician regarding the use of this medicine in children. While this drug may be prescribed for selected conditions, precautions do apply. Overdosage: If you think you have taken too much of this medicine contact a poison control center or emergency  room at once. NOTE: This medicine is only for you. Do not share this medicine with others. What if I miss a dose? If you miss a dose, take it as soon as you can. If it is almost time for your next dose, take only that dose. Do not take double or extra doses. What may interact with this medication? Interactions have not been studied. This list may not describe all possible interactions. Give your health care provider a list of all the medicines, herbs, non-prescription drugs, or dietary supplements you use. Also tell them if you smoke, drink alcohol, or use illegal drugs. Some items may interact with your medicine. What should I watch for while using this medication? Your condition will be monitored carefully while you are receiving this medicine. You may need blood work done while you are taking this medicine. This medicine may cause a decrease in vitamin B6. You should make sure that you get enough vitamin B6 while you are taking this medicine. Discuss the foods you eat and the vitamins you take with your health care professional. What side effects may I notice from receiving this medication? Side effects that you should report to your doctor or health care professional as soon as possible: allergic reactions like skin rash, itching or hives, swelling of the face, lips, or tongue seizures signs and symptoms of a blood clot such as breathing problems; changes in vision; chest pain; severe, sudden headache; pain, swelling, warmth in the leg; trouble speaking; sudden numbness or weakness of the face, arm or leg signs and symptoms of a stroke like   changes in vision; confusion; trouble speaking or understanding; severe headaches; sudden numbness or weakness of the face, arm or leg; trouble walking; dizziness; loss of balance or coordination Side effects that usually do not require medical attention (report to your doctor or health care professional if they continue or are  bothersome): chills cough dizziness fever headaches joint pain muscle cramps muscle pain nausea, vomiting pain, redness, or irritation at site where injected This list may not describe all possible side effects. Call your doctor for medical advice about side effects. You may report side effects to FDA at 1-800-FDA-1088. Where should I keep my medication? Keep out of the reach of children. Store in a refrigerator between 2 and 8 degrees C (36 and 46 degrees F). Do not freeze or shake. Throw away any unused portion if using a single-dose vial. Multi-dose vials can be kept in the refrigerator for up to 21 days after the initial dose. Throw away unused medicine. NOTE: This sheet is a summary. It may not cover all possible information. If you have questions about this medicine, talk to your doctor, pharmacist, or health care provider.  2022 [employer]vier/Gold Standard (2017-03-30 08:35:19)  

## 2021-02-16 DIAGNOSIS — E039 Hypothyroidism, unspecified: Secondary | ICD-10-CM | POA: Diagnosis not present

## 2021-02-28 DIAGNOSIS — N401 Enlarged prostate with lower urinary tract symptoms: Secondary | ICD-10-CM | POA: Diagnosis not present

## 2021-02-28 DIAGNOSIS — R3912 Poor urinary stream: Secondary | ICD-10-CM | POA: Diagnosis not present

## 2021-02-28 DIAGNOSIS — R310 Gross hematuria: Secondary | ICD-10-CM | POA: Diagnosis not present

## 2021-03-02 ENCOUNTER — Other Ambulatory Visit: Payer: Self-pay | Admitting: Internal Medicine

## 2021-03-02 ENCOUNTER — Telehealth: Payer: Self-pay | Admitting: Internal Medicine

## 2021-03-02 NOTE — Telephone Encounter (Signed)
Spoke with daughter who stated prescription of Eliquis was not received by pharmacy. Called pharmacy for confirmation and daughter hung up in the process. Pharmacy advised pt picked this medication up 2 days ago.

## 2021-03-02 NOTE — Telephone Encounter (Signed)
Prescription refill request for Eliquis received. Indication: afib  Last office visit: 04/14/2020, [place] Silos Scr: 1.76, 02/14/2021 Age: 86 yo  Weight: 50.1 kg   Pt is on the correct dose of Eliquis per dosing criteria, prescription refill sent for Eliquis 2.5 mg BID.

## 2021-03-07 NOTE — Progress Notes (Signed)
Patient Care Team: Lavone Orn, MD as PCP - General (Internal Medicine) Sueanne Margarita, MD as PCP - Cardiology (Cardiology) Sueanne Margarita, MD as Consulting Physician (Cardiology)  DIAGNOSIS:    ICD-10-CM   1. Anemia of chronic disease  D63.8       CHIEF COMPLIANT: Follow-up of anemia, thrombocytopenia  INTERVAL HISTORY: Joshua Hudson is a 86 y.o. with above-mentioned history of anemia and thrombocytopenia currently on treatment with Aranesp (last 01/03/21). Labs on 02/14/21 showed WBC 6.5, RBC 3.93, HGB 8.7, HCT 27.3, PLT 128, Iron sat 41%, and Ferritin 113. He presents to the clinic today for follow-up and to review his labs.  He reports continued symptoms of fatigue and lack of energy to do anything.  Today's hemoglobin is 8.4.  Therefore he does not need blood transfusion.  ALLERGIES:  has No Known Allergies.  MEDICATIONS:  Current Outpatient Medications  Medication Sig Dispense Refill   amiodarone (PACERONE) 200 MG tablet Take 1 tablet (200 mg total) by mouth daily. Pt needs to make appt with provider for further refills 90 tablet 1   ELIQUIS 2.5 MG TABS tablet TAKE 1 TABLET(2.5 MG) BY MOUTH TWICE DAILY 180 tablet 1   finasteride (PROSCAR) 5 MG tablet Take 5 mg by mouth daily as needed (retention).      folic acid (FOLVITE) 1 MG tablet Take 1 mg by mouth daily.     furosemide (LASIX) 40 MG tablet Take 1 tablet (40 mg total) by mouth daily as needed. 90 tablet 3   leflunomide (ARAVA) 20 MG tablet Take 20 mg by mouth daily.     levothyroxine (SYNTHROID) 75 MCG tablet 1 tablet in the morning on an empty stomach     Multiple Vitamin (MULTIVITAMIN WITH MINERALS) TABS tablet Take 1 tablet by mouth daily.     pantoprazole (PROTONIX) 40 MG tablet Take 40 mg by mouth daily before breakfast.      Polyethyl Glycol-Propyl Glycol (LUBRICANT EYE DROPS) 0.4-0.3 % SOLN Place 1 drop into both eyes 3 (three) times daily as needed (dry/irritated eyes.).     potassium chloride SA (KLOR-CON)  20 MEQ tablet TAKE 1 TABLET BY MOUTH EVERY DAY 90 tablet 2   predniSONE (DELTASONE) 5 MG tablet Take 5 mg by mouth daily with breakfast.      torsemide (DEMADEX) 20 MG tablet 1 tablet     traMADol (ULTRAM) 50 MG tablet Take 50 mg by mouth every 6 (six) hours as needed.     No current facility-administered medications for this visit.    PHYSICAL EXAMINATION: ECOG PERFORMANCE STATUS: 3 - Symptomatic, >50% confined to bed  Vitals:   03/08/21 0905  BP: (!) 143/68  Pulse: 61  Resp: 17  Temp: 97.6 F (36.4 C)  SpO2: 100%   Filed Weights   03/08/21 0905  Weight: 108 lb 4.8 oz (49.1 kg)    LABORATORY DATA:  I have reviewed the data as listed CMP Latest Ref Rng & Units 03/08/2021 02/14/2021 01/24/2021  Glucose 70 - 99 mg/dL 92 112(H) 90  BUN 8 - 23 mg/dL 26(H) 29(H) 32(H)  Creatinine 0.61 - 1.24 mg/dL 1.62(H) 1.76(H) 1.97(H)  Sodium 135 - 145 mmol/L 143 142 143  Potassium 3.5 - 5.1 mmol/L 4.2 3.5 4.8  Chloride 98 - 111 mmol/L 108 104 105  CO2 22 - 32 mmol/L 27 26 28   Calcium 8.9 - 10.3 mg/dL 8.4(L) 8.7(L) 9.0  Total Protein 6.5 - 8.1 g/dL 6.1(L) 5.7(L) 5.8(L)  Total Bilirubin 0.3 -  1.2 mg/dL 0.6 0.5 0.5  Alkaline Phos 38 - 126 U/L 48 52 51  AST 15 - 41 U/L 27 29 30   ALT 0 - 44 U/L 14 13 12     Lab Results  Component Value Date   WBC 6.5 02/14/2021   HGB 8.7 (L) 02/14/2021   HCT 27.3 (L) 02/14/2021   MCV 69.5 (L) 02/14/2021   PLT 128 (L) 02/14/2021   NEUTROABS 4.9 02/14/2021    ASSESSMENT & PLAN:  Anemia of chronic disease On 05/06/2019: Hemoglobin 7.6: Blood transfusion given Hemoglobin electrophoresis: Beta thalassemia minor.  This is the cause of microcytosis.  It is not iron deficiency related.   Possible differential: Anemia of chronic disease versus myelodysplastic syndrome versus methotrexate related toxicity. His methotrexate has been on hold.  However his hemoglobin continues to remain low   Hospitalization 09/22/2019-09/25/2019: A. fib with RVR status post  cardioversion, currently on Eliquis   Lab review: 09/25/2019: WBC 7.2, hemoglobin 9.6, platelets 145 10/03/19: WBC: 7.5, Hb 8.9, Pl: 135 03/31/2020: WBC 7.9, Hb 9.2, platelets 125 06/24/2020: Hemoglobin 7.4 (received blood transfusion) 07/26/2020: Hemoglobin 7.9 (blood transfusion) 08/09/2020: Hemoglobin 9.2 (Retacrit injection) 09/21/2020: Hemoglobin 8.1 (Retacrit injection) 11/23/20: Hb 6.4 (Retacrit and 11/24/20 Blood transfusion) 01/24/21: Hemoglobin 7.3 (Retacrit and blood) 02/14/2021: Hemoglobin 8.7 (Retacrit) 03/08/2021: Hemoglobin 8.4 (Retacrit)   His anemia is due to chronic kidney disease stage III.     Current treatment plan: Retacrit injections, blood transfusions as needed (when hemoglobin drops below 7.5) Return to clinic every 3 weeks for Retacrit injection   Every 6 weeks to follow-up with me with labs    No orders of the defined types were placed in this encounter.  The patient has a good understanding of the overall plan. he agrees with it. he will call with any problems that may develop before the next visit here.  Total time spent: 20 mins including face to face time and time spent for planning, charting and coordination of care  [name], MD, MPH 03/08/2021  I, Thana Ates, am acting as scribe for Dr. Nicholas Lose.  I have reviewed the above documentation for accuracy and completeness, and I agree with the above.

## 2021-03-08 ENCOUNTER — Inpatient Hospital Stay: Payer: Medicare PPO | Attending: Hematology and Oncology

## 2021-03-08 ENCOUNTER — Other Ambulatory Visit: Payer: Self-pay

## 2021-03-08 ENCOUNTER — Inpatient Hospital Stay: Payer: Medicare PPO | Admitting: Hematology and Oncology

## 2021-03-08 ENCOUNTER — Inpatient Hospital Stay: Payer: Medicare PPO

## 2021-03-08 DIAGNOSIS — D696 Thrombocytopenia, unspecified: Secondary | ICD-10-CM

## 2021-03-08 DIAGNOSIS — I4891 Unspecified atrial fibrillation: Secondary | ICD-10-CM | POA: Diagnosis not present

## 2021-03-08 DIAGNOSIS — D638 Anemia in other chronic diseases classified elsewhere: Secondary | ICD-10-CM

## 2021-03-08 DIAGNOSIS — Z7901 Long term (current) use of anticoagulants: Secondary | ICD-10-CM | POA: Insufficient documentation

## 2021-03-08 DIAGNOSIS — D561 Beta thalassemia: Secondary | ICD-10-CM

## 2021-03-08 DIAGNOSIS — D563 Thalassemia minor: Secondary | ICD-10-CM | POA: Insufficient documentation

## 2021-03-08 LAB — CBC WITH DIFFERENTIAL (CANCER CENTER ONLY)
Abs Immature Granulocytes: 0.04 10*3/uL (ref 0.00–0.07)
Basophils Absolute: 0 10*3/uL (ref 0.0–0.1)
Basophils Relative: 1 %
Eosinophils Absolute: 0.2 10*3/uL (ref 0.0–0.5)
Eosinophils Relative: 2 %
HCT: 26.5 % — ABNORMAL LOW (ref 39.0–52.0)
Hemoglobin: 8.4 g/dL — ABNORMAL LOW (ref 13.0–17.0)
Immature Granulocytes: 1 %
Lymphocytes Relative: 6 %
Lymphs Abs: 0.5 10*3/uL — ABNORMAL LOW (ref 0.7–4.0)
MCH: 21.8 pg — ABNORMAL LOW (ref 26.0–34.0)
MCHC: 31.7 g/dL (ref 30.0–36.0)
MCV: 68.8 fL — ABNORMAL LOW (ref 80.0–100.0)
Monocytes Absolute: 0.5 10*3/uL (ref 0.1–1.0)
Monocytes Relative: 6 %
Neutro Abs: 7 10*3/uL (ref 1.7–7.7)
Neutrophils Relative %: 84 %
Platelet Count: 131 10*3/uL — ABNORMAL LOW (ref 150–400)
RBC: 3.85 MIL/uL — ABNORMAL LOW (ref 4.22–5.81)
RDW: 21.6 % — ABNORMAL HIGH (ref 11.5–15.5)
WBC Count: 8.3 10*3/uL (ref 4.0–10.5)
nRBC: 0.2 % (ref 0.0–0.2)

## 2021-03-08 LAB — CMP (CANCER CENTER ONLY)
ALT: 14 U/L (ref 0–44)
AST: 27 U/L (ref 15–41)
Albumin: 3.4 g/dL — ABNORMAL LOW (ref 3.5–5.0)
Alkaline Phosphatase: 48 U/L (ref 38–126)
Anion gap: 8 (ref 5–15)
BUN: 26 mg/dL — ABNORMAL HIGH (ref 8–23)
CO2: 27 mmol/L (ref 22–32)
Calcium: 8.4 mg/dL — ABNORMAL LOW (ref 8.9–10.3)
Chloride: 108 mmol/L (ref 98–111)
Creatinine: 1.62 mg/dL — ABNORMAL HIGH (ref 0.61–1.24)
GFR, Estimated: 42 mL/min — ABNORMAL LOW (ref >60)
Glucose, Bld: 92 mg/dL (ref 70–99)
Potassium: 4.2 mmol/L (ref 3.5–5.1)
Sodium: 143 mmol/L (ref 135–145)
Total Bilirubin: 0.6 mg/dL (ref 0.3–1.2)
Total Protein: 6.1 g/dL — ABNORMAL LOW (ref 6.5–8.1)

## 2021-03-08 LAB — SAMPLE TO BLOOD BANK

## 2021-03-08 MED ORDER — EPOETIN ALFA-EPBX 40000 UNIT/ML IJ SOLN
40000.0000 [IU] | Freq: Once | INTRAMUSCULAR | Status: AC
  Administered 2021-03-08: 40000 [IU] via SUBCUTANEOUS

## 2021-03-08 MED ORDER — EPOETIN ALFA-EPBX 40000 UNIT/ML IJ SOLN
INTRAMUSCULAR | Status: AC
  Filled 2021-03-08: qty 1

## 2021-03-08 NOTE — Patient Instructions (Signed)
Epoetin Alfa injection What is this medication? EPOETIN ALFA (e POE e tin AL fa) helps your body make more red blood cells. This medicine is used to treat anemia caused by chronic kidney disease, cancer chemotherapy, or HIV-therapy. It may also be used before surgery if you have anemia. This medicine may be used for other purposes; ask your health care provider or pharmacist if you have questions. COMMON BRAND NAME(S): Epogen, Procrit, Retacrit What should I tell my care team before I take this medication? They need to know if you have any of these conditions: cancer heart disease high blood pressure history of blood clots history of stroke low levels of folate, iron, or vitamin B12 in the blood seizures an unusual or allergic reaction to erythropoietin, albumin, benzyl alcohol, hamster proteins, other medicines, foods, dyes, or preservatives pregnant or trying to get pregnant breast-feeding How should I use this medication? This medicine is for injection into a vein or under the skin. It is usually given by a health care professional in a hospital or clinic setting. If you get this medicine at home, you will be taught how to prepare and give this medicine. Use exactly as directed. Take your medicine at regular intervals. Do not take your medicine more often than directed. It is important that you put your used needles and syringes in a special sharps container. Do not put them in a trash can. If you do not have a sharps container, call your pharmacist or healthcare provider to get one. A special MedGuide will be given to you by the pharmacist with each prescription and refill. Be sure to read this information carefully each time. Talk to your pediatrician regarding the use of this medicine in children. While this drug may be prescribed for selected conditions, precautions do apply. Overdosage: If you think you have taken too much of this medicine contact a poison control center or emergency  room at once. NOTE: This medicine is only for you. Do not share this medicine with others. What if I miss a dose? If you miss a dose, take it as soon as you can. If it is almost time for your next dose, take only that dose. Do not take double or extra doses. What may interact with this medication? Interactions have not been studied. This list may not describe all possible interactions. Give your health care provider a list of all the medicines, herbs, non-prescription drugs, or dietary supplements you use. Also tell them if you smoke, drink alcohol, or use illegal drugs. Some items may interact with your medicine. What should I watch for while using this medication? Your condition will be monitored carefully while you are receiving this medicine. You may need blood work done while you are taking this medicine. This medicine may cause a decrease in vitamin B6. You should make sure that you get enough vitamin B6 while you are taking this medicine. Discuss the foods you eat and the vitamins you take with your health care professional. What side effects may I notice from receiving this medication? Side effects that you should report to your doctor or health care professional as soon as possible: allergic reactions like skin rash, itching or hives, swelling of the face, lips, or tongue seizures signs and symptoms of a blood clot such as breathing problems; changes in vision; chest pain; severe, sudden headache; pain, swelling, warmth in the leg; trouble speaking; sudden numbness or weakness of the face, arm or leg signs and symptoms of a stroke like   changes in vision; confusion; trouble speaking or understanding; severe headaches; sudden numbness or weakness of the face, arm or leg; trouble walking; dizziness; loss of balance or coordination Side effects that usually do not require medical attention (report to your doctor or health care professional if they continue or are  bothersome): chills cough dizziness fever headaches joint pain muscle cramps muscle pain nausea, vomiting pain, redness, or irritation at site where injected This list may not describe all possible side effects. Call your doctor for medical advice about side effects. You may report side effects to FDA at 1-800-FDA-1088. Where should I keep my medication? Keep out of the reach of children. Store in a refrigerator between 2 and 8 degrees C (36 and 46 degrees F). Do not freeze or shake. Throw away any unused portion if using a single-dose vial. Multi-dose vials can be kept in the refrigerator for up to 21 days after the initial dose. Throw away unused medicine. NOTE: This sheet is a summary. It may not cover all possible information. If you have questions about this medicine, talk to your doctor, pharmacist, or health care provider.  2022 [employer]vier/Gold Standard (2017-03-30 08:35:19)  

## 2021-03-08 NOTE — Assessment & Plan Note (Signed)
On 05/06/2019: Hemoglobin 7.6: Blood transfusion given Hemoglobin electrophoresis: Beta thalassemia minor.This is the cause of microcytosis. It is not iron deficiency related.  Possible differential:Anemia of chronic disease versus myelodysplastic syndrome versus methotrexate related toxicity. His methotrexate has been on hold.However his hemoglobin continues to remain low  Hospitalization 09/22/2019-09/25/2019: A. fib with RVR status post cardioversion, currently on Eliquis  Lab review: 09/25/2019: WBC 7.2, hemoglobin 9.6, platelets 145 10/03/19: WBC: 7.5, Hb 8.9, Pl: 135 03/31/2020:WBC 7.9, Hb 9.2,platelets 125 06/24/2020: Hemoglobin 7.4 (received blood transfusion) 07/26/2020: Hemoglobin 7.9 (bloodtransfusion) 08/09/2020:Hemoglobin 9.2 (Retacritinjection) 09/21/2020: Hemoglobin 8.1 (Retacrit injection) 11/23/20: Hb 6.4 (Retacrit and 11/24/20 Bloodtransfusion) 01/24/21: Hemoglobin 7.3 (Retacrit and blood) 02/14/2021: Hemoglobin 8.7 (Retacrit) 03/08/2021:  His anemia is due to chronic disease.  Current treatment plan:Retacritinjections, blood transfusions as needed (when hemoglobin drops below 7.5) Return to clinic every 3 weeks forRetacritinjection  Every 6 weeks to follow-up with me with labs

## 2021-03-09 DIAGNOSIS — H353211 Exudative age-related macular degeneration, right eye, with active choroidal neovascularization: Secondary | ICD-10-CM | POA: Diagnosis not present

## 2021-03-24 ENCOUNTER — Other Ambulatory Visit: Payer: Self-pay | Admitting: Physician Assistant

## 2021-03-29 ENCOUNTER — Inpatient Hospital Stay: Payer: Medicare PPO

## 2021-03-29 ENCOUNTER — Telehealth: Payer: Self-pay | Admitting: *Deleted

## 2021-03-29 ENCOUNTER — Other Ambulatory Visit: Payer: Self-pay | Admitting: *Deleted

## 2021-03-29 ENCOUNTER — Other Ambulatory Visit: Payer: Self-pay

## 2021-03-29 VITALS — BP 112/65 | HR 61 | Temp 98.2°F | Resp 18

## 2021-03-29 DIAGNOSIS — Z7901 Long term (current) use of anticoagulants: Secondary | ICD-10-CM | POA: Diagnosis not present

## 2021-03-29 DIAGNOSIS — D638 Anemia in other chronic diseases classified elsewhere: Secondary | ICD-10-CM | POA: Diagnosis not present

## 2021-03-29 DIAGNOSIS — D563 Thalassemia minor: Secondary | ICD-10-CM | POA: Diagnosis not present

## 2021-03-29 DIAGNOSIS — D696 Thrombocytopenia, unspecified: Secondary | ICD-10-CM

## 2021-03-29 DIAGNOSIS — I4891 Unspecified atrial fibrillation: Secondary | ICD-10-CM | POA: Diagnosis not present

## 2021-03-29 LAB — CMP (CANCER CENTER ONLY)
ALT: 11 U/L (ref 0–44)
AST: 24 U/L (ref 15–41)
Albumin: 3.4 g/dL — ABNORMAL LOW (ref 3.5–5.0)
Alkaline Phosphatase: 46 U/L (ref 38–126)
Anion gap: 10 (ref 5–15)
BUN: 29 mg/dL — ABNORMAL HIGH (ref 8–23)
CO2: 28 mmol/L (ref 22–32)
Calcium: 8.9 mg/dL (ref 8.9–10.3)
Chloride: 103 mmol/L (ref 98–111)
Creatinine: 1.93 mg/dL — ABNORMAL HIGH (ref 0.61–1.24)
GFR, Estimated: 34 mL/min — ABNORMAL LOW (ref >60)
Glucose, Bld: 106 mg/dL — ABNORMAL HIGH (ref 70–99)
Potassium: 4.3 mmol/L (ref 3.5–5.1)
Sodium: 141 mmol/L (ref 135–145)
Total Bilirubin: 0.5 mg/dL (ref 0.3–1.2)
Total Protein: 6.1 g/dL — ABNORMAL LOW (ref 6.5–8.1)

## 2021-03-29 LAB — PREPARE RBC (CROSSMATCH)

## 2021-03-29 LAB — CBC WITH DIFFERENTIAL (CANCER CENTER ONLY)
Abs Immature Granulocytes: 0.03 10*3/uL (ref 0.00–0.07)
Basophils Absolute: 0 10*3/uL (ref 0.0–0.1)
Basophils Relative: 0 %
Eosinophils Absolute: 0.1 10*3/uL (ref 0.0–0.5)
Eosinophils Relative: 1 %
HCT: 24.5 % — ABNORMAL LOW (ref 39.0–52.0)
Hemoglobin: 7.7 g/dL — ABNORMAL LOW (ref 13.0–17.0)
Immature Granulocytes: 1 %
Lymphocytes Relative: 7 %
Lymphs Abs: 0.4 10*3/uL — ABNORMAL LOW (ref 0.7–4.0)
MCH: 21.9 pg — ABNORMAL LOW (ref 26.0–34.0)
MCHC: 31.4 g/dL (ref 30.0–36.0)
MCV: 69.8 fL — ABNORMAL LOW (ref 80.0–100.0)
Monocytes Absolute: 0.3 10*3/uL (ref 0.1–1.0)
Monocytes Relative: 5 %
Neutro Abs: 4.9 10*3/uL (ref 1.7–7.7)
Neutrophils Relative %: 86 %
Platelet Count: 158 10*3/uL (ref 150–400)
RBC: 3.51 MIL/uL — ABNORMAL LOW (ref 4.22–5.81)
RDW: 19.7 % — ABNORMAL HIGH (ref 11.5–15.5)
WBC Count: 5.7 10*3/uL (ref 4.0–10.5)
nRBC: 0.4 % — ABNORMAL HIGH (ref 0.0–0.2)

## 2021-03-29 LAB — SAMPLE TO BLOOD BANK

## 2021-03-29 MED ORDER — EPOETIN ALFA-EPBX 40000 UNIT/ML IJ SOLN
INTRAMUSCULAR | Status: AC
  Filled 2021-03-29: qty 1

## 2021-03-29 MED ORDER — SODIUM CHLORIDE 0.9% IV SOLUTION
250.0000 mL | Freq: Once | INTRAVENOUS | Status: AC
  Administered 2021-03-29: 250 mL via INTRAVENOUS
  Filled 2021-03-29: qty 250

## 2021-03-29 MED ORDER — DIPHENHYDRAMINE HCL 25 MG PO CAPS
ORAL_CAPSULE | ORAL | Status: AC
  Filled 2021-03-29: qty 1

## 2021-03-29 MED ORDER — ACETAMINOPHEN 325 MG PO TABS
ORAL_TABLET | ORAL | Status: AC
  Filled 2021-03-29: qty 2

## 2021-03-29 MED ORDER — EPOETIN ALFA-EPBX 40000 UNIT/ML IJ SOLN
40000.0000 [IU] | Freq: Once | INTRAMUSCULAR | Status: AC
  Administered 2021-03-29: 40000 [IU] via SUBCUTANEOUS

## 2021-03-29 MED ORDER — ACETAMINOPHEN 325 MG PO TABS
650.0000 mg | ORAL_TABLET | Freq: Once | ORAL | Status: AC
  Administered 2021-03-29: 650 mg via ORAL

## 2021-03-29 MED ORDER — DIPHENHYDRAMINE HCL 25 MG PO CAPS
25.0000 mg | ORAL_CAPSULE | Freq: Once | ORAL | Status: AC
  Administered 2021-03-29: 25 mg via ORAL

## 2021-03-29 NOTE — Patient Instructions (Signed)
https://www.[facility].org/donate-blood/blood-donation-process/what-happens-to-donated-blood/blood-transfusions/types-of-[email or web address].html"> https://www.[facility].org/donate-blood/blood-donation-process/what-happens-to-donated-blood/blood-transfusions/risks-complications.html">  Blood Transfusion, Adult, Care After This sheet gives you information about how to care for yourself after your procedure. Your health care provider may also give you more specific instructions. If you have problems or questions, contact your health careprovider. What can I expect after the procedure? After the procedure, it is common to have: Bruising and soreness where the IV was inserted. A fever or chills on the day of the procedure. This may be your body's response to the new blood cells received. A headache. Follow these instructions at home: IV insertion site care     Follow instructions from your health care provider about how to take care of your IV insertion site. Make sure you: Wash your hands with soap and water before and after you change your bandage (dressing). If soap and water are not available, use hand sanitizer. Change your dressing as told by your health care provider. Check your IV insertion site every day for signs of infection. Check for: Redness, swelling, or pain. Bleeding from the site. Warmth. Pus or a bad smell. General instructions Take over-the-counter and prescription medicines only as told by your health care provider. Rest as told by your health care provider. Return to your normal activities as told by your health care provider. Keep all follow-up visits as told by your health care provider. This is important. Contact a health care provider if: You have itching or red, swollen areas of skin (hives). You feel anxious. You feel weak after doing your normal activities. You have redness, swelling, warmth, or pain around the IV insertion site. You have blood coming  from the IV insertion site that does not stop with pressure. You have pus or a bad smell coming from your IV insertion site. Get help right away if: You have symptoms of a serious allergic or immune system reaction, including: Trouble breathing or shortness of breath. Swelling of the face or feeling flushed. Fever or chills. Pain in the head, back, or chest. Dark urine or blood in the urine. Widespread rash. Fast heartbeat. Feeling dizzy or light-headed. If you receive your blood transfusion in an outpatient setting, you will betold whom to contact to report any reactions. These symptoms may represent a serious problem that is an emergency. Do not wait to see if the symptoms will go away. Get medical help right away. Call your local emergency services (911 in the [place] Do not drive yourself to the hospital. Summary Bruising and tenderness around the IV insertion site are common. Check your IV insertion site every day for signs of infection. Rest as told by your health care provider. Return to your normal activities as told by your health care provider. Get help right away for symptoms of a serious allergic or immune system reaction to blood transfusion. This information is not intended to replace advice given to you by your health care provider. Make sure you discuss any questions you have with your healthcare provider. Document Revised: 02/13/2019 Document Reviewed: 02/13/2019 Elsevier Patient Education  2022 [employer]  

## 2021-03-29 NOTE — Telephone Encounter (Signed)
Pt Hgb 7.7. Pt reports feeling dizziness and feeling tired. Discussed with Dr Jana Hakim. Will transfuse 1 unit of blood today.

## 2021-03-30 LAB — TYPE AND SCREEN
ABO/RH(D): A POS
Antibody Screen: NEGATIVE
Unit division: 0

## 2021-03-30 LAB — BPAM RBC
Blood Product Expiration Date: 202208042359
ISSUE DATE / TIME: 202207261243
Unit Type and Rh: 6200

## 2021-04-13 DIAGNOSIS — M2550 Pain in unspecified joint: Secondary | ICD-10-CM | POA: Diagnosis not present

## 2021-04-13 DIAGNOSIS — M0579 Rheumatoid arthritis with rheumatoid factor of multiple sites without organ or systems involvement: Secondary | ICD-10-CM | POA: Diagnosis not present

## 2021-04-13 DIAGNOSIS — Z79899 Other long term (current) drug therapy: Secondary | ICD-10-CM | POA: Diagnosis not present

## 2021-04-13 DIAGNOSIS — M810 Age-related osteoporosis without current pathological fracture: Secondary | ICD-10-CM | POA: Diagnosis not present

## 2021-04-13 DIAGNOSIS — Z681 Body mass index (BMI) 19 or less, adult: Secondary | ICD-10-CM | POA: Diagnosis not present

## 2021-04-13 DIAGNOSIS — D508 Other iron deficiency anemias: Secondary | ICD-10-CM | POA: Diagnosis not present

## 2021-04-13 DIAGNOSIS — R5383 Other fatigue: Secondary | ICD-10-CM | POA: Diagnosis not present

## 2021-04-14 DIAGNOSIS — H353211 Exudative age-related macular degeneration, right eye, with active choroidal neovascularization: Secondary | ICD-10-CM | POA: Diagnosis not present

## 2021-04-17 NOTE — Progress Notes (Signed)
Patient Care Team: Lavone Orn, MD as PCP - General (Internal Medicine) Sueanne Margarita, MD as PCP - Cardiology (Cardiology) Sueanne Margarita, MD as Consulting Physician (Cardiology)  DIAGNOSIS:    ICD-10-CM   1. Thrombocytopenia (HCC)  D69.6       CHIEF COMPLIANT: Follow-up of anemia, thrombocytopenia  INTERVAL HISTORY: Joshua Hudson is a 86 y.o. with above-mentioned history of anemia and thrombocytopenia currently on treatment with Aranesp. Labs on 03/29/21 showed WBC 5.7, RBC 3.51, Hgb 7.7, HCT 24.5, PLT 158. He presents to the clinic today for follow-up and to review his labs.  He continues to feel very poorly.  Energy levels have not improved much with blood transfusion.  ALLERGIES:  has No Known Allergies.  MEDICATIONS:  Current Outpatient Medications  Medication Sig Dispense Refill   amiodarone (PACERONE) 200 MG tablet Take 1 tablet (200 mg total) by mouth daily. Pt needs to make appt with provider for further refills 90 tablet 1   ELIQUIS 2.5 MG TABS tablet TAKE 1 TABLET(2.5 MG) BY MOUTH TWICE DAILY 180 tablet 1   finasteride (PROSCAR) 5 MG tablet Take 5 mg by mouth daily as needed (retention).      folic acid (FOLVITE) 1 MG tablet Take 1 mg by mouth daily.     furosemide (LASIX) 40 MG tablet Take 1 tablet (40 mg total) by mouth daily as needed. 90 tablet 3   leflunomide (ARAVA) 20 MG tablet Take 20 mg by mouth daily.     levothyroxine (SYNTHROID) 75 MCG tablet 1 tablet in the morning on an empty stomach     Multiple Vitamin (MULTIVITAMIN WITH MINERALS) TABS tablet Take 1 tablet by mouth daily.     pantoprazole (PROTONIX) 40 MG tablet Take 40 mg by mouth daily before breakfast.      Polyethyl Glycol-Propyl Glycol (LUBRICANT EYE DROPS) 0.4-0.3 % SOLN Place 1 drop into both eyes 3 (three) times daily as needed (dry/irritated eyes.).     potassium chloride SA (KLOR-CON) 20 MEQ tablet Take 1 tablet (20 mEq total) by mouth daily. NEED OV. 90 tablet 0   predniSONE (DELTASONE)  5 MG tablet Take 5 mg by mouth daily with breakfast.      torsemide (DEMADEX) 20 MG tablet 1 tablet     traMADol (ULTRAM) 50 MG tablet Take 50 mg by mouth every 6 (six) hours as needed.     No current facility-administered medications for this visit.    PHYSICAL EXAMINATION: ECOG PERFORMANCE STATUS: 3 - Symptomatic, >50% confined to bed  Vitals:   04/18/21 0923  BP: 135/65  Pulse: (!) 58  Resp: 18  Temp: (!) 97.5 F (36.4 C)  SpO2: 100%   Filed Weights   04/18/21 0923  Weight: 107 lb 1.6 oz (48.6 kg)    LABORATORY DATA:  I have reviewed the data as listed CMP Latest Ref Rng & Units 03/29/2021 03/08/2021 02/14/2021  Glucose 70 - 99 mg/dL 106(H) 92 112(H)  BUN 8 - 23 mg/dL 29(H) 26(H) 29(H)  Creatinine 0.61 - 1.24 mg/dL 1.93(H) 1.62(H) 1.76(H)  Sodium 135 - 145 mmol/L 141 143 142  Potassium 3.5 - 5.1 mmol/L 4.3 4.2 3.5  Chloride 98 - 111 mmol/L 103 108 104  CO2 22 - 32 mmol/L '28 27 26  '$ Calcium 8.9 - 10.3 mg/dL 8.9 8.4(L) 8.7(L)  Total Protein 6.5 - 8.1 g/dL 6.1(L) 6.1(L) 5.7(L)  Total Bilirubin 0.3 - 1.2 mg/dL 0.5 0.6 0.5  Alkaline Phos 38 - 126 U/L 46 48 52  AST 15 - 41 U/L '24 27 29  '$ ALT 0 - 44 U/L '11 14 13    '$ Lab Results  Component Value Date   WBC 8.8 04/18/2021   HGB 8.7 (L) 04/18/2021   HCT 27.6 (L) 04/18/2021   MCV 71.1 (L) 04/18/2021   PLT 124 (L) 04/18/2021   NEUTROABS 7.5 04/18/2021    ASSESSMENT & PLAN:  Thrombocytopenia ([place]) On 05/06/2019: Hemoglobin 7.6: Blood transfusion given Hemoglobin electrophoresis: Beta thalassemia minor.  This is the cause of microcytosis.  It is not iron deficiency related.   Possible differential: Anemia of chronic disease versus myelodysplastic syndrome versus methotrexate related toxicity. His methotrexate has been on hold.  However his hemoglobin continues to remain low   Hospitalization 09/22/2019-09/25/2019: A. fib with RVR status post cardioversion, currently on Eliquis   Lab review: 09/25/2019: WBC 7.2, hemoglobin 9.6,  platelets 145 10/03/19: WBC: 7.5, Hb 8.9, Pl: 135 03/31/2020: WBC 7.9, Hb 9.2, platelets 125 06/24/2020: Hemoglobin 7.4 (received blood transfusion) 07/26/2020: Hemoglobin 7.9 (blood transfusion) 08/09/2020: Hemoglobin 9.2 (Retacrit injection) 09/21/2020: Hemoglobin 8.1 (Retacrit injection) 11/23/20: Hb 6.4 (Retacrit and 11/24/20 Blood transfusion) 01/24/21: Hemoglobin 7.3 (Retacrit and blood) 02/14/2021: Hemoglobin 8.7 (Retacrit) 03/08/2021: Hemoglobin 8.4 (Retacrit) 04/18/2021: Hemoglobin 8.7 (Retacrit), blood transfusion on 04/04/2021 for hemoglobin of 7.7   His anemia is due to chronic kidney disease stage III.     Current treatment plan: Retacrit injections, blood transfusions as needed (when hemoglobin drops below 8/severe symptoms) Return to clinic every 3 weeks for Retacrit injection   Every 6 weeks to follow-up with me with labs    No orders of the defined types were placed in this encounter.  The patient has a good understanding of the overall plan. he agrees with it. he will call with any problems that may develop before the next visit here.  Total time spent: 20 mins including face to face time and time spent for planning, charting and coordination of care  [name], MD, MPH 04/18/2021  I, Thana Ates, am acting as scribe for Dr. Nicholas Lose.  I have reviewed the above documentation for accuracy and completeness, and I agree with the above.

## 2021-04-17 NOTE — Assessment & Plan Note (Signed)
On 05/06/2019: Hemoglobin 7.6: Blood transfusion given Hemoglobin electrophoresis: Beta thalassemia minor.This is the cause of microcytosis. It is not iron deficiency related.  Possible differential:Anemia of chronic disease versus myelodysplastic syndrome versus methotrexate related toxicity. His methotrexate has been on hold.However his hemoglobin continues to remain low  Hospitalization 09/22/2019-09/25/2019: A. fib with RVR status post cardioversion, currently on Eliquis  Lab review: 09/25/2019: WBC 7.2, hemoglobin 9.6, platelets 145 10/03/19: WBC: 7.5, Hb 8.9, Pl: 135 03/31/2020:WBC 7.9, Hb 9.2,platelets 125 06/24/2020: Hemoglobin 7.4 (received blood transfusion) 07/26/2020: Hemoglobin 7.9 (bloodtransfusion) 08/09/2020:Hemoglobin 9.2 (Retacritinjection) 09/21/2020: Hemoglobin 8.1 (Retacrit injection) 11/23/20: Hb 6.4 (Retacrit and 11/24/20 Bloodtransfusion) 01/24/21: Hemoglobin7.3 (Retacritand blood) 02/14/2021: Hemoglobin 8.7 (Retacrit) 03/08/2021: Hemoglobin 8.4 (Retacrit)  His anemia is due to chronic kidney disease stage III.  Current treatment plan:Retacritinjections, blood transfusions as needed(when hemoglobin drops below 7.5) Return to clinic every 3 weeks forRetacritinjection  Every 6 weeks to follow-up with me with labs

## 2021-04-18 ENCOUNTER — Inpatient Hospital Stay: Payer: Medicare PPO | Attending: Hematology and Oncology

## 2021-04-18 ENCOUNTER — Inpatient Hospital Stay: Payer: Medicare PPO

## 2021-04-18 ENCOUNTER — Inpatient Hospital Stay: Payer: Medicare PPO | Admitting: Hematology and Oncology

## 2021-04-18 ENCOUNTER — Other Ambulatory Visit: Payer: Self-pay

## 2021-04-18 DIAGNOSIS — D563 Thalassemia minor: Secondary | ICD-10-CM | POA: Diagnosis not present

## 2021-04-18 DIAGNOSIS — D638 Anemia in other chronic diseases classified elsewhere: Secondary | ICD-10-CM | POA: Insufficient documentation

## 2021-04-18 DIAGNOSIS — D696 Thrombocytopenia, unspecified: Secondary | ICD-10-CM | POA: Diagnosis not present

## 2021-04-18 LAB — CBC WITH DIFFERENTIAL (CANCER CENTER ONLY)
Abs Immature Granulocytes: 0.04 10*3/uL (ref 0.00–0.07)
Basophils Absolute: 0 10*3/uL (ref 0.0–0.1)
Basophils Relative: 0 %
Eosinophils Absolute: 0.1 10*3/uL (ref 0.0–0.5)
Eosinophils Relative: 2 %
HCT: 27.6 % — ABNORMAL LOW (ref 39.0–52.0)
Hemoglobin: 8.7 g/dL — ABNORMAL LOW (ref 13.0–17.0)
Immature Granulocytes: 1 %
Lymphocytes Relative: 7 %
Lymphs Abs: 0.7 10*3/uL (ref 0.7–4.0)
MCH: 22.4 pg — ABNORMAL LOW (ref 26.0–34.0)
MCHC: 31.5 g/dL (ref 30.0–36.0)
MCV: 71.1 fL — ABNORMAL LOW (ref 80.0–100.0)
Monocytes Absolute: 0.4 10*3/uL (ref 0.1–1.0)
Monocytes Relative: 5 %
Neutro Abs: 7.5 10*3/uL (ref 1.7–7.7)
Neutrophils Relative %: 85 %
Platelet Count: 124 10*3/uL — ABNORMAL LOW (ref 150–400)
RBC: 3.88 MIL/uL — ABNORMAL LOW (ref 4.22–5.81)
RDW: 21.2 % — ABNORMAL HIGH (ref 11.5–15.5)
WBC Count: 8.8 10*3/uL (ref 4.0–10.5)
nRBC: 0.2 % (ref 0.0–0.2)

## 2021-04-18 LAB — SAMPLE TO BLOOD BANK

## 2021-04-18 LAB — CMP (CANCER CENTER ONLY)
ALT: 12 U/L (ref 0–44)
AST: 26 U/L (ref 15–41)
Albumin: 3.6 g/dL (ref 3.5–5.0)
Alkaline Phosphatase: 47 U/L (ref 38–126)
Anion gap: 11 (ref 5–15)
BUN: 28 mg/dL — ABNORMAL HIGH (ref 8–23)
CO2: 26 mmol/L (ref 22–32)
Calcium: 8.7 mg/dL — ABNORMAL LOW (ref 8.9–10.3)
Chloride: 105 mmol/L (ref 98–111)
Creatinine: 1.97 mg/dL — ABNORMAL HIGH (ref 0.61–1.24)
GFR, Estimated: 33 mL/min — ABNORMAL LOW (ref >60)
Glucose, Bld: 88 mg/dL (ref 70–99)
Potassium: 4.6 mmol/L (ref 3.5–5.1)
Sodium: 142 mmol/L (ref 135–145)
Total Bilirubin: 0.5 mg/dL (ref 0.3–1.2)
Total Protein: 5.9 g/dL — ABNORMAL LOW (ref 6.5–8.1)

## 2021-04-18 MED ORDER — EPOETIN ALFA-EPBX 40000 UNIT/ML IJ SOLN
INTRAMUSCULAR | Status: AC
  Filled 2021-04-18: qty 1

## 2021-04-18 MED ORDER — EPOETIN ALFA-EPBX 40000 UNIT/ML IJ SOLN
40000.0000 [IU] | Freq: Once | INTRAMUSCULAR | Status: AC
  Administered 2021-04-18: 40000 [IU] via SUBCUTANEOUS

## 2021-04-18 NOTE — Patient Instructions (Signed)
Epoetin Alfa injection What is this medication? EPOETIN ALFA (e POE e tin AL fa) helps your body make more red blood cells. This medicine is used to treat anemia caused by chronic kidney disease, cancer chemotherapy, or HIV-therapy. It may also be used before surgery if you have anemia. This medicine may be used for other purposes; ask your health care provider or pharmacist if you have questions. COMMON BRAND NAME(S): Epogen, Procrit, Retacrit What should I tell my care team before I take this medication? They need to know if you have any of these conditions: cancer heart disease high blood pressure history of blood clots history of stroke low levels of folate, iron, or vitamin B12 in the blood seizures an unusual or allergic reaction to erythropoietin, albumin, benzyl alcohol, hamster proteins, other medicines, foods, dyes, or preservatives pregnant or trying to get pregnant breast-feeding How should I use this medication? This medicine is for injection into a vein or under the skin. It is usually given by a health care professional in a hospital or clinic setting. If you get this medicine at home, you will be taught how to prepare and give this medicine. Use exactly as directed. Take your medicine at regular intervals. Do not take your medicine more often than directed. It is important that you put your used needles and syringes in a special sharps container. Do not put them in a trash can. If you do not have a sharps container, call your pharmacist or healthcare provider to get one. A special MedGuide will be given to you by the pharmacist with each prescription and refill. Be sure to read this information carefully each time. Talk to your pediatrician regarding the use of this medicine in children. While this drug may be prescribed for selected conditions, precautions do apply. Overdosage: If you think you have taken too much of this medicine contact a poison control center or emergency  room at once. NOTE: This medicine is only for you. Do not share this medicine with others. What if I miss a dose? If you miss a dose, take it as soon as you can. If it is almost time for your next dose, take only that dose. Do not take double or extra doses. What may interact with this medication? Interactions have not been studied. This list may not describe all possible interactions. Give your health care provider a list of all the medicines, herbs, non-prescription drugs, or dietary supplements you use. Also tell them if you smoke, drink alcohol, or use illegal drugs. Some items may interact with your medicine. What should I watch for while using this medication? Your condition will be monitored carefully while you are receiving this medicine. You may need blood work done while you are taking this medicine. This medicine may cause a decrease in vitamin B6. You should make sure that you get enough vitamin B6 while you are taking this medicine. Discuss the foods you eat and the vitamins you take with your health care professional. What side effects may I notice from receiving this medication? Side effects that you should report to your doctor or health care professional as soon as possible: allergic reactions like skin rash, itching or hives, swelling of the face, lips, or tongue seizures signs and symptoms of a blood clot such as breathing problems; changes in vision; chest pain; severe, sudden headache; pain, swelling, warmth in the leg; trouble speaking; sudden numbness or weakness of the face, arm or leg signs and symptoms of a stroke like   changes in vision; confusion; trouble speaking or understanding; severe headaches; sudden numbness or weakness of the face, arm or leg; trouble walking; dizziness; loss of balance or coordination Side effects that usually do not require medical attention (report to your doctor or health care professional if they continue or are  bothersome): chills cough dizziness fever headaches joint pain muscle cramps muscle pain nausea, vomiting pain, redness, or irritation at site where injected This list may not describe all possible side effects. Call your doctor for medical advice about side effects. You may report side effects to FDA at 1-800-FDA-1088. Where should I keep my medication? Keep out of the reach of children. Store in a refrigerator between 2 and 8 degrees C (36 and 46 degrees F). Do not freeze or shake. Throw away any unused portion if using a single-dose vial. Multi-dose vials can be kept in the refrigerator for up to 21 days after the initial dose. Throw away unused medicine. NOTE: This sheet is a summary. It may not cover all possible information. If you have questions about this medicine, talk to your doctor, pharmacist, or health care provider.  2022 [employer]vier/Gold Standard (2017-03-30 08:35:19)  

## 2021-04-23 ENCOUNTER — Other Ambulatory Visit: Payer: Self-pay | Admitting: Internal Medicine

## 2021-04-25 DIAGNOSIS — I480 Paroxysmal atrial fibrillation: Secondary | ICD-10-CM | POA: Diagnosis not present

## 2021-04-25 DIAGNOSIS — N401 Enlarged prostate with lower urinary tract symptoms: Secondary | ICD-10-CM | POA: Diagnosis not present

## 2021-04-25 DIAGNOSIS — F4321 Adjustment disorder with depressed mood: Secondary | ICD-10-CM | POA: Diagnosis not present

## 2021-04-25 DIAGNOSIS — M1990 Unspecified osteoarthritis, unspecified site: Secondary | ICD-10-CM | POA: Diagnosis not present

## 2021-04-25 DIAGNOSIS — J439 Emphysema, unspecified: Secondary | ICD-10-CM | POA: Diagnosis not present

## 2021-04-25 DIAGNOSIS — K219 Gastro-esophageal reflux disease without esophagitis: Secondary | ICD-10-CM | POA: Diagnosis not present

## 2021-04-25 DIAGNOSIS — D61818 Other pancytopenia: Secondary | ICD-10-CM | POA: Diagnosis not present

## 2021-04-25 DIAGNOSIS — M069 Rheumatoid arthritis, unspecified: Secondary | ICD-10-CM | POA: Diagnosis not present

## 2021-04-25 DIAGNOSIS — M8580 Other specified disorders of bone density and structure, unspecified site: Secondary | ICD-10-CM | POA: Diagnosis not present

## 2021-05-10 ENCOUNTER — Inpatient Hospital Stay: Payer: Medicare PPO | Attending: Hematology and Oncology

## 2021-05-10 ENCOUNTER — Other Ambulatory Visit: Payer: Self-pay

## 2021-05-10 ENCOUNTER — Inpatient Hospital Stay: Payer: Medicare PPO

## 2021-05-10 VITALS — BP 123/71 | HR 64 | Temp 98.5°F | Resp 15

## 2021-05-10 DIAGNOSIS — N183 Chronic kidney disease, stage 3 unspecified: Secondary | ICD-10-CM | POA: Diagnosis not present

## 2021-05-10 DIAGNOSIS — D638 Anemia in other chronic diseases classified elsewhere: Secondary | ICD-10-CM | POA: Diagnosis not present

## 2021-05-10 DIAGNOSIS — D563 Thalassemia minor: Secondary | ICD-10-CM | POA: Insufficient documentation

## 2021-05-10 DIAGNOSIS — I4891 Unspecified atrial fibrillation: Secondary | ICD-10-CM | POA: Insufficient documentation

## 2021-05-10 DIAGNOSIS — Z7901 Long term (current) use of anticoagulants: Secondary | ICD-10-CM | POA: Insufficient documentation

## 2021-05-10 DIAGNOSIS — D696 Thrombocytopenia, unspecified: Secondary | ICD-10-CM

## 2021-05-10 DIAGNOSIS — D631 Anemia in chronic kidney disease: Secondary | ICD-10-CM | POA: Diagnosis not present

## 2021-05-10 LAB — CBC WITH DIFFERENTIAL (CANCER CENTER ONLY)
Abs Immature Granulocytes: 0.06 10*3/uL (ref 0.00–0.07)
Basophils Absolute: 0 10*3/uL (ref 0.0–0.1)
Basophils Relative: 0 %
Eosinophils Absolute: 0.3 10*3/uL (ref 0.0–0.5)
Eosinophils Relative: 4 %
HCT: 26.1 % — ABNORMAL LOW (ref 39.0–52.0)
Hemoglobin: 8.2 g/dL — ABNORMAL LOW (ref 13.0–17.0)
Immature Granulocytes: 1 %
Lymphocytes Relative: 10 %
Lymphs Abs: 0.7 10*3/uL (ref 0.7–4.0)
MCH: 21.8 pg — ABNORMAL LOW (ref 26.0–34.0)
MCHC: 31.4 g/dL (ref 30.0–36.0)
MCV: 69.2 fL — ABNORMAL LOW (ref 80.0–100.0)
Monocytes Absolute: 0.6 10*3/uL (ref 0.1–1.0)
Monocytes Relative: 8 %
Neutro Abs: 5.6 10*3/uL (ref 1.7–7.7)
Neutrophils Relative %: 77 %
Platelet Count: 148 10*3/uL — ABNORMAL LOW (ref 150–400)
RBC: 3.77 MIL/uL — ABNORMAL LOW (ref 4.22–5.81)
RDW: 19.8 % — ABNORMAL HIGH (ref 11.5–15.5)
WBC Count: 7.2 10*3/uL (ref 4.0–10.5)
nRBC: 0.3 % — ABNORMAL HIGH (ref 0.0–0.2)

## 2021-05-10 LAB — CMP (CANCER CENTER ONLY)
ALT: 12 U/L (ref 0–44)
AST: 17 U/L (ref 15–41)
Albumin: 3.4 g/dL — ABNORMAL LOW (ref 3.5–5.0)
Alkaline Phosphatase: 48 U/L (ref 38–126)
Anion gap: 13 (ref 5–15)
BUN: 34 mg/dL — ABNORMAL HIGH (ref 8–23)
CO2: 26 mmol/L (ref 22–32)
Calcium: 9.1 mg/dL (ref 8.9–10.3)
Chloride: 105 mmol/L (ref 98–111)
Creatinine: 2.04 mg/dL — ABNORMAL HIGH (ref 0.61–1.24)
GFR, Estimated: 32 mL/min — ABNORMAL LOW (ref >60)
Glucose, Bld: 125 mg/dL — ABNORMAL HIGH (ref 70–99)
Potassium: 3.6 mmol/L (ref 3.5–5.1)
Sodium: 144 mmol/L (ref 135–145)
Total Bilirubin: 0.5 mg/dL (ref 0.3–1.2)
Total Protein: 6 g/dL — ABNORMAL LOW (ref 6.5–8.1)

## 2021-05-10 LAB — SAMPLE TO BLOOD BANK

## 2021-05-10 MED ORDER — EPOETIN ALFA-EPBX 40000 UNIT/ML IJ SOLN
40000.0000 [IU] | Freq: Once | INTRAMUSCULAR | Status: AC
  Administered 2021-05-10: 40000 [IU] via SUBCUTANEOUS
  Filled 2021-05-10: qty 1

## 2021-05-10 NOTE — Patient Instructions (Signed)
Epoetin Alfa injection What is this medication? EPOETIN ALFA (e POE e tin AL fa) helps your body make more red blood cells. This medicine is used to treat anemia caused by chronic kidney disease, cancer chemotherapy, or HIV-therapy. It may also be used before surgery if you have anemia. This medicine may be used for other purposes; ask your health care provider or pharmacist if you have questions. COMMON BRAND NAME(S): Epogen, Procrit, Retacrit What should I tell my care team before I take this medication? They need to know if you have any of these conditions: cancer heart disease high blood pressure history of blood clots history of stroke low levels of folate, iron, or vitamin B12 in the blood seizures an unusual or allergic reaction to erythropoietin, albumin, benzyl alcohol, hamster proteins, other medicines, foods, dyes, or preservatives pregnant or trying to get pregnant breast-feeding How should I use this medication? This medicine is for injection into a vein or under the skin. It is usually given by a health care professional in a hospital or clinic setting. If you get this medicine at home, you will be taught how to prepare and give this medicine. Use exactly as directed. Take your medicine at regular intervals. Do not take your medicine more often than directed. It is important that you put your used needles and syringes in a special sharps container. Do not put them in a trash can. If you do not have a sharps container, call your pharmacist or healthcare provider to get one. A special MedGuide will be given to you by the pharmacist with each prescription and refill. Be sure to read this information carefully each time. Talk to your pediatrician regarding the use of this medicine in children. While this drug may be prescribed for selected conditions, precautions do apply. Overdosage: If you think you have taken too much of this medicine contact a poison control center or emergency  room at once. NOTE: This medicine is only for you. Do not share this medicine with others. What if I miss a dose? If you miss a dose, take it as soon as you can. If it is almost time for your next dose, take only that dose. Do not take double or extra doses. What may interact with this medication? Interactions have not been studied. This list may not describe all possible interactions. Give your health care provider a list of all the medicines, herbs, non-prescription drugs, or dietary supplements you use. Also tell them if you smoke, drink alcohol, or use illegal drugs. Some items may interact with your medicine. What should I watch for while using this medication? Your condition will be monitored carefully while you are receiving this medicine. You may need blood work done while you are taking this medicine. This medicine may cause a decrease in vitamin B6. You should make sure that you get enough vitamin B6 while you are taking this medicine. Discuss the foods you eat and the vitamins you take with your health care professional. What side effects may I notice from receiving this medication? Side effects that you should report to your doctor or health care professional as soon as possible: allergic reactions like skin rash, itching or hives, swelling of the face, lips, or tongue seizures signs and symptoms of a blood clot such as breathing problems; changes in vision; chest pain; severe, sudden headache; pain, swelling, warmth in the leg; trouble speaking; sudden numbness or weakness of the face, arm or leg signs and symptoms of a stroke like   changes in vision; confusion; trouble speaking or understanding; severe headaches; sudden numbness or weakness of the face, arm or leg; trouble walking; dizziness; loss of balance or coordination Side effects that usually do not require medical attention (report to your doctor or health care professional if they continue or are  bothersome): chills cough dizziness fever headaches joint pain muscle cramps muscle pain nausea, vomiting pain, redness, or irritation at site where injected This list may not describe all possible side effects. Call your doctor for medical advice about side effects. You may report side effects to FDA at 1-800-FDA-1088. Where should I keep my medication? Keep out of the reach of children. Store in a refrigerator between 2 and 8 degrees C (36 and 46 degrees F). Do not freeze or shake. Throw away any unused portion if using a single-dose vial. Multi-dose vials can be kept in the refrigerator for up to 21 days after the initial dose. Throw away unused medicine. NOTE: This sheet is a summary. It may not cover all possible information. If you have questions about this medicine, talk to your doctor, pharmacist, or health care provider.  2022 [employer]vier/Gold Standard (2017-03-30 08:35:19)  

## 2021-05-17 DIAGNOSIS — H353211 Exudative age-related macular degeneration, right eye, with active choroidal neovascularization: Secondary | ICD-10-CM | POA: Diagnosis not present

## 2021-05-18 DIAGNOSIS — K219 Gastro-esophageal reflux disease without esophagitis: Secondary | ICD-10-CM | POA: Diagnosis not present

## 2021-05-18 DIAGNOSIS — I480 Paroxysmal atrial fibrillation: Secondary | ICD-10-CM | POA: Diagnosis not present

## 2021-05-18 DIAGNOSIS — Z23 Encounter for immunization: Secondary | ICD-10-CM | POA: Diagnosis not present

## 2021-05-18 DIAGNOSIS — E039 Hypothyroidism, unspecified: Secondary | ICD-10-CM | POA: Diagnosis not present

## 2021-05-18 DIAGNOSIS — E43 Unspecified severe protein-calorie malnutrition: Secondary | ICD-10-CM | POA: Diagnosis not present

## 2021-05-18 DIAGNOSIS — Z5181 Encounter for therapeutic drug level monitoring: Secondary | ICD-10-CM | POA: Diagnosis not present

## 2021-05-18 DIAGNOSIS — K220 Achalasia of cardia: Secondary | ICD-10-CM | POA: Diagnosis not present

## 2021-05-18 DIAGNOSIS — I5030 Unspecified diastolic (congestive) heart failure: Secondary | ICD-10-CM | POA: Diagnosis not present

## 2021-05-25 ENCOUNTER — Ambulatory Visit (HOSPITAL_BASED_OUTPATIENT_CLINIC_OR_DEPARTMENT_OTHER): Payer: Medicare PPO | Admitting: General Practice

## 2021-05-28 NOTE — Progress Notes (Signed)
Patient Care Team: Lavone Orn, MD as PCP - General (Internal Medicine) Sueanne Margarita, MD as PCP - Cardiology (Cardiology) Sueanne Margarita, MD as Consulting Physician (Cardiology)  DIAGNOSIS:    ICD-10-CM   1. Other pancytopenia ([place])  X90.240       CHIEF COMPLIANT: Follow-up of anemia, thrombocytopenia  INTERVAL HISTORY: Joshua Hudson is a 86 y.o. with above-mentioned history of anemia and thrombocytopenia currently on treatment with Aranesp. Labs on 05/10/2021 showed WBC 7.2, RBC 3.77, Hgb 8.2, HCT 26.1, PLT 148. He presents to the clinic today for follow-up and to review his labs.  Patient feels extremely tired.  However he does not feel dizzy or lightheaded.  ALLERGIES:  has No Known Allergies.  MEDICATIONS:  Current Outpatient Medications  Medication Sig Dispense Refill   amiodarone (PACERONE) 200 MG tablet Take 1 tablet (200 mg total) by mouth daily. Pt needs to make appt with provider for further refills - 1st attempt 90 tablet 0   ELIQUIS 2.5 MG TABS tablet TAKE 1 TABLET(2.5 MG) BY MOUTH TWICE DAILY 180 tablet 1   finasteride (PROSCAR) 5 MG tablet Take 5 mg by mouth daily as needed (retention).      folic acid (FOLVITE) 1 MG tablet Take 1 mg by mouth daily.     furosemide (LASIX) 40 MG tablet Take 1 tablet (40 mg total) by mouth daily as needed. 90 tablet 3   leflunomide (ARAVA) 20 MG tablet Take 20 mg by mouth daily.     levothyroxine (SYNTHROID) 75 MCG tablet 1 tablet in the morning on an empty stomach     Multiple Vitamin (MULTIVITAMIN WITH MINERALS) TABS tablet Take 1 tablet by mouth daily.     pantoprazole (PROTONIX) 40 MG tablet Take 40 mg by mouth daily before breakfast.      Polyethyl Glycol-Propyl Glycol (LUBRICANT EYE DROPS) 0.4-0.3 % SOLN Place 1 drop into both eyes 3 (three) times daily as needed (dry/irritated eyes.).     potassium chloride SA (KLOR-CON) 20 MEQ tablet Take 1 tablet (20 mEq total) by mouth daily. NEED OV. 90 tablet 0   predniSONE  (DELTASONE) 5 MG tablet Take 5 mg by mouth daily with breakfast.      torsemide (DEMADEX) 20 MG tablet 1 tablet     traMADol (ULTRAM) 50 MG tablet Take 50 mg by mouth every 6 (six) hours as needed.     No current facility-administered medications for this visit.    PHYSICAL EXAMINATION: ECOG PERFORMANCE STATUS: 1 - Symptomatic but completely ambulatory  Vitals:   05/30/21 0758  BP: (!) 106/55  Pulse: (!) 59  Resp: 18  Temp: (!) 97.5 F (36.4 C)  SpO2: 99%   Filed Weights   05/30/21 0758  Weight: 108 lb 6.4 oz (49.2 kg)    LABORATORY DATA:  I have reviewed the data as listed CMP Latest Ref Rng & Units 05/30/2021 05/10/2021 04/18/2021  Glucose 70 - 99 mg/dL 95 125(H) 88  BUN 8 - 23 mg/dL 38(H) 34(H) 28(H)  Creatinine 0.61 - 1.24 mg/dL 2.07(H) 2.04(H) 1.97(H)  Sodium 135 - 145 mmol/L 140 144 142  Potassium 3.5 - 5.1 mmol/L 3.8 3.6 4.6  Chloride 98 - 111 mmol/L 104 105 105  CO2 22 - 32 mmol/L 26 26 26   Calcium 8.9 - 10.3 mg/dL 8.7(L) 9.1 8.7(L)  Total Protein 6.5 - 8.1 g/dL 5.6(L) 6.0(L) 5.9(L)  Total Bilirubin 0.3 - 1.2 mg/dL 0.4 0.5 0.5  Alkaline Phos 38 - 126 U/L 42 48  47  AST 15 - 41 U/L 23 17 26   ALT 0 - 44 U/L 9 12 12     Lab Results  Component Value Date   WBC 6.6 05/30/2021   HGB 6.7 (LL) 05/30/2021   HCT 21.2 (L) 05/30/2021   MCV 67.9 (L) 05/30/2021   PLT 85 (L) 05/30/2021   NEUTROABS 5.4 05/30/2021    ASSESSMENT & PLAN:  Other pancytopenia (HCC) Hemoglobin electrophoresis: Beta thalassemia minor.  This is the cause of microcytosis.  It is not iron deficiency related.   Possible differential: Anemia of chronic disease versus myelodysplastic syndrome versus methotrexate related toxicity. His methotrexate has been on hold.  However his hemoglobin continues to remain low   Hospitalization 09/22/2019-09/25/2019: A. fib with RVR status post cardioversion, currently on Eliquis   Lab review: 09/25/2019: WBC 7.2, hemoglobin 9.6, platelets 145 10/03/19: WBC: 7.5, Hb  8.9, Pl: 135 03/31/2020: WBC 7.9, Hb 9.2, platelets 125 06/24/2020: Hemoglobin 7.4 (received blood transfusion) 07/26/2020: Hemoglobin 7.9 (blood transfusion) 08/09/2020: Hemoglobin 9.2 (Retacrit injection) 09/21/2020: Hemoglobin 8.1 (Retacrit injection) 11/23/20: Hb 6.4 (Retacrit and 11/24/20 Blood transfusion) 01/24/21: Hemoglobin 7.3 (Retacrit and blood) 02/14/2021: Hemoglobin 8.7 (Retacrit) 03/08/2021: Hemoglobin 8.4 (Retacrit) 04/18/2021: Hemoglobin 8.7 (Retacrit), blood transfusion on 04/04/2021 for hemoglobin of 7.7  05/30/21: Hemoglobin 6.7, blood transfusion, platelets 85 (will need to be watched)   His anemia is due to chronic kidney disease stage III.     Current treatment plan: Retacrit injections, blood transfusions as needed (when hemoglobin drops below 8/severe symptoms) Return to clinic every 3 weeks for Retacrit injection   Every 6 weeks to follow-up with me with labs      No orders of the defined types were placed in this encounter.  The patient has a good understanding of the overall plan. he agrees with it. he will call with any problems that may develop before the next visit here.  Total time spent: 20 mins including face to face time and time spent for planning, charting and coordination of care  [name], MD, MPH 05/30/2021  I, Thana Ates, am acting as scribe for Dr. Nicholas Lose.  I have reviewed the above documentation for accuracy and completeness, and I agree with the above.

## 2021-05-30 ENCOUNTER — Inpatient Hospital Stay: Payer: Medicare PPO

## 2021-05-30 ENCOUNTER — Inpatient Hospital Stay: Payer: Medicare PPO | Admitting: Hematology and Oncology

## 2021-05-30 ENCOUNTER — Other Ambulatory Visit: Payer: Self-pay

## 2021-05-30 DIAGNOSIS — D61818 Other pancytopenia: Secondary | ICD-10-CM

## 2021-05-30 DIAGNOSIS — D631 Anemia in chronic kidney disease: Secondary | ICD-10-CM | POA: Diagnosis not present

## 2021-05-30 DIAGNOSIS — I4891 Unspecified atrial fibrillation: Secondary | ICD-10-CM | POA: Diagnosis not present

## 2021-05-30 DIAGNOSIS — D638 Anemia in other chronic diseases classified elsewhere: Secondary | ICD-10-CM | POA: Diagnosis not present

## 2021-05-30 DIAGNOSIS — D696 Thrombocytopenia, unspecified: Secondary | ICD-10-CM

## 2021-05-30 DIAGNOSIS — Z7901 Long term (current) use of anticoagulants: Secondary | ICD-10-CM | POA: Diagnosis not present

## 2021-05-30 DIAGNOSIS — D563 Thalassemia minor: Secondary | ICD-10-CM | POA: Diagnosis not present

## 2021-05-30 DIAGNOSIS — N183 Chronic kidney disease, stage 3 unspecified: Secondary | ICD-10-CM | POA: Diagnosis not present

## 2021-05-30 LAB — CBC WITH DIFFERENTIAL (CANCER CENTER ONLY)
Abs Immature Granulocytes: 0.03 10*3/uL (ref 0.00–0.07)
Basophils Absolute: 0 10*3/uL (ref 0.0–0.1)
Basophils Relative: 0 %
Eosinophils Absolute: 0.2 10*3/uL (ref 0.0–0.5)
Eosinophils Relative: 4 %
HCT: 21.2 % — ABNORMAL LOW (ref 39.0–52.0)
Hemoglobin: 6.7 g/dL — CL (ref 13.0–17.0)
Immature Granulocytes: 1 %
Lymphocytes Relative: 7 %
Lymphs Abs: 0.5 10*3/uL — ABNORMAL LOW (ref 0.7–4.0)
MCH: 21.5 pg — ABNORMAL LOW (ref 26.0–34.0)
MCHC: 31.6 g/dL (ref 30.0–36.0)
MCV: 67.9 fL — ABNORMAL LOW (ref 80.0–100.0)
Monocytes Absolute: 0.4 10*3/uL (ref 0.1–1.0)
Monocytes Relative: 7 %
Neutro Abs: 5.4 10*3/uL (ref 1.7–7.7)
Neutrophils Relative %: 81 %
Platelet Count: 85 10*3/uL — ABNORMAL LOW (ref 150–400)
RBC: 3.12 MIL/uL — ABNORMAL LOW (ref 4.22–5.81)
RDW: 19.9 % — ABNORMAL HIGH (ref 11.5–15.5)
WBC Count: 6.6 10*3/uL (ref 4.0–10.5)
nRBC: 0.3 % — ABNORMAL HIGH (ref 0.0–0.2)

## 2021-05-30 LAB — CMP (CANCER CENTER ONLY)
ALT: 9 U/L (ref 0–44)
AST: 23 U/L (ref 15–41)
Albumin: 3.4 g/dL — ABNORMAL LOW (ref 3.5–5.0)
Alkaline Phosphatase: 42 U/L (ref 38–126)
Anion gap: 10 (ref 5–15)
BUN: 38 mg/dL — ABNORMAL HIGH (ref 8–23)
CO2: 26 mmol/L (ref 22–32)
Calcium: 8.7 mg/dL — ABNORMAL LOW (ref 8.9–10.3)
Chloride: 104 mmol/L (ref 98–111)
Creatinine: 2.07 mg/dL — ABNORMAL HIGH (ref 0.61–1.24)
GFR, Estimated: 31 mL/min — ABNORMAL LOW (ref >60)
Glucose, Bld: 95 mg/dL (ref 70–99)
Potassium: 3.8 mmol/L (ref 3.5–5.1)
Sodium: 140 mmol/L (ref 135–145)
Total Bilirubin: 0.4 mg/dL (ref 0.3–1.2)
Total Protein: 5.6 g/dL — ABNORMAL LOW (ref 6.5–8.1)

## 2021-05-30 LAB — SAMPLE TO BLOOD BANK

## 2021-05-30 LAB — PREPARE RBC (CROSSMATCH)

## 2021-05-30 NOTE — Assessment & Plan Note (Signed)
Hemoglobin electrophoresis: Beta thalassemia minor.This is the cause of microcytosis. It is not iron deficiency related.  Possible differential:Anemia of chronic disease versus myelodysplastic syndrome versus methotrexate related toxicity. His methotrexate has been on hold.However his hemoglobin continues to remain low  Hospitalization 09/22/2019-09/25/2019: A. fib with RVR status post cardioversion, currently on Eliquis  Lab review: 09/25/2019: WBC 7.2, hemoglobin 9.6, platelets 145 10/03/19: WBC: 7.5, Hb 8.9, Pl: 135 03/31/2020:WBC 7.9, Hb 9.2,platelets 125 06/24/2020: Hemoglobin 7.4 (received blood transfusion) 07/26/2020: Hemoglobin 7.9 (bloodtransfusion) 08/09/2020:Hemoglobin 9.2 (Retacritinjection) 09/21/2020: Hemoglobin 8.1 (Retacrit injection) 11/23/20: Hb 6.4 (Retacrit and 11/24/20 Bloodtransfusion) 01/24/21: Hemoglobin7.3 (Retacritand blood) 02/14/2021: Hemoglobin 8.7 (Retacrit) 03/08/2021:Hemoglobin 8.4 (Retacrit) 04/18/2021: Hemoglobin 8.7 (Retacrit), blood transfusion on 04/04/2021 for hemoglobin of 7.7  05/30/21:   His anemia is due to chronickidneydiseasestage III.  Current treatment plan:Retacritinjections, blood transfusions as needed(when hemoglobin drops below 8/severe symptoms) Return to clinic every 3 weeks forRetacritinjection  Every 6 weeks to follow-up with me with labs

## 2021-05-30 NOTE — Progress Notes (Signed)
CRITICAL VALUE STICKER  CRITICAL VALUE: HGB 6.7  RECEIVER (on-site recipient of call):Jalon Squier RN  DATE & TIME NOTIFIED: 0800 05/30/2021  MESSENGER (representative from lab):  MD NOTIFIED: Dr. Lindi Adie   TIME OF NOTIFICATION: 0805  RESPONSE: doctor aware patient being seen by him today

## 2021-05-30 NOTE — Progress Notes (Signed)
Pt knows to arrive to [facility] 9/27 at 0745 for check in and will receive 2 U PRBCs. Verified with blood bank prepare order.

## 2021-05-31 ENCOUNTER — Inpatient Hospital Stay: Payer: Medicare PPO

## 2021-05-31 DIAGNOSIS — D638 Anemia in other chronic diseases classified elsewhere: Secondary | ICD-10-CM | POA: Diagnosis not present

## 2021-05-31 DIAGNOSIS — N183 Chronic kidney disease, stage 3 unspecified: Secondary | ICD-10-CM | POA: Diagnosis not present

## 2021-05-31 DIAGNOSIS — D631 Anemia in chronic kidney disease: Secondary | ICD-10-CM | POA: Diagnosis not present

## 2021-05-31 DIAGNOSIS — D563 Thalassemia minor: Secondary | ICD-10-CM | POA: Diagnosis not present

## 2021-05-31 DIAGNOSIS — D696 Thrombocytopenia, unspecified: Secondary | ICD-10-CM

## 2021-05-31 DIAGNOSIS — Z7901 Long term (current) use of anticoagulants: Secondary | ICD-10-CM | POA: Diagnosis not present

## 2021-05-31 DIAGNOSIS — I4891 Unspecified atrial fibrillation: Secondary | ICD-10-CM | POA: Diagnosis not present

## 2021-05-31 MED ORDER — DIPHENHYDRAMINE HCL 25 MG PO CAPS
25.0000 mg | ORAL_CAPSULE | Freq: Once | ORAL | Status: AC
  Administered 2021-05-31: 25 mg via ORAL
  Filled 2021-05-31: qty 1

## 2021-05-31 MED ORDER — ACETAMINOPHEN 325 MG PO TABS
650.0000 mg | ORAL_TABLET | Freq: Once | ORAL | Status: AC
  Administered 2021-05-31: 650 mg via ORAL
  Filled 2021-05-31: qty 2

## 2021-05-31 MED ORDER — SODIUM CHLORIDE 0.9% IV SOLUTION
250.0000 mL | Freq: Once | INTRAVENOUS | Status: AC
  Administered 2021-05-31: 250 mL via INTRAVENOUS

## 2021-05-31 NOTE — Patient Instructions (Signed)
https://www.[facility].org/donate-blood/blood-donation-process/what-happens-to-donated-blood/blood-transfusions/types-of-[email or web address].html"> https://www.[facility].org/donate-blood/blood-donation-process/what-happens-to-donated-blood/blood-transfusions/risks-complications.html">  Blood Transfusion, Adult, Care After This sheet gives you information about how to care for yourself after your procedure. Your health care provider may also give you more specific instructions. If you have problems or questions, contact your health careprovider. What can I expect after the procedure? After the procedure, it is common to have: Bruising and soreness where the IV was inserted. A fever or chills on the day of the procedure. This may be your body's response to the new blood cells received. A headache. Follow these instructions at home: IV insertion site care     Follow instructions from your health care provider about how to take care of your IV insertion site. Make sure you: Wash your hands with soap and water before and after you change your bandage (dressing). If soap and water are not available, use hand sanitizer. Change your dressing as told by your health care provider. Check your IV insertion site every day for signs of infection. Check for: Redness, swelling, or pain. Bleeding from the site. Warmth. Pus or a bad smell. General instructions Take over-the-counter and prescription medicines only as told by your health care provider. Rest as told by your health care provider. Return to your normal activities as told by your health care provider. Keep all follow-up visits as told by your health care provider. This is important. Contact a health care provider if: You have itching or red, swollen areas of skin (hives). You feel anxious. You feel weak after doing your normal activities. You have redness, swelling, warmth, or pain around the IV insertion site. You have blood coming  from the IV insertion site that does not stop with pressure. You have pus or a bad smell coming from your IV insertion site. Get help right away if: You have symptoms of a serious allergic or immune system reaction, including: Trouble breathing or shortness of breath. Swelling of the face or feeling flushed. Fever or chills. Pain in the head, back, or chest. Dark urine or blood in the urine. Widespread rash. Fast heartbeat. Feeling dizzy or light-headed. If you receive your blood transfusion in an outpatient setting, you will betold whom to contact to report any reactions. These symptoms may represent a serious problem that is an emergency. Do not wait to see if the symptoms will go away. Get medical help right away. Call your local emergency services (911 in the [place] Do not drive yourself to the hospital. Summary Bruising and tenderness around the IV insertion site are common. Check your IV insertion site every day for signs of infection. Rest as told by your health care provider. Return to your normal activities as told by your health care provider. Get help right away for symptoms of a serious allergic or immune system reaction to blood transfusion. This information is not intended to replace advice given to you by your health care provider. Make sure you discuss any questions you have with your healthcare provider. Document Revised: 02/13/2019 Document Reviewed: 02/13/2019 Elsevier Patient Education  2022 [employer]  

## 2021-06-01 LAB — BPAM RBC
Blood Product Expiration Date: 202210152359
Blood Product Expiration Date: 202210152359
ISSUE DATE / TIME: 202209270819
ISSUE DATE / TIME: 202209270819
Unit Type and Rh: 6200
Unit Type and Rh: 6200

## 2021-06-01 LAB — TYPE AND SCREEN
ABO/RH(D): A POS
Antibody Screen: NEGATIVE
Unit division: 0
Unit division: 0

## 2021-06-06 ENCOUNTER — Encounter: Payer: Self-pay | Admitting: Hematology and Oncology

## 2021-06-08 ENCOUNTER — Telehealth: Payer: Self-pay | Admitting: Hematology and Oncology

## 2021-06-08 ENCOUNTER — Other Ambulatory Visit: Payer: Self-pay | Admitting: Cardiovascular Disease

## 2021-06-08 NOTE — Telephone Encounter (Signed)
Scheduled per sch msg. Called and left msg. Mailed printout  

## 2021-06-20 ENCOUNTER — Inpatient Hospital Stay: Payer: Medicare PPO | Attending: Hematology and Oncology

## 2021-06-20 ENCOUNTER — Other Ambulatory Visit: Payer: Self-pay

## 2021-06-20 ENCOUNTER — Inpatient Hospital Stay: Payer: Medicare PPO

## 2021-06-20 VITALS — BP 119/67 | HR 57 | Temp 98.4°F | Resp 16

## 2021-06-20 DIAGNOSIS — D696 Thrombocytopenia, unspecified: Secondary | ICD-10-CM

## 2021-06-20 DIAGNOSIS — D638 Anemia in other chronic diseases classified elsewhere: Secondary | ICD-10-CM | POA: Insufficient documentation

## 2021-06-20 DIAGNOSIS — D563 Thalassemia minor: Secondary | ICD-10-CM | POA: Diagnosis not present

## 2021-06-20 LAB — CBC WITH DIFFERENTIAL (CANCER CENTER ONLY)
Abs Immature Granulocytes: 0.05 10*3/uL (ref 0.00–0.07)
Basophils Absolute: 0 10*3/uL (ref 0.0–0.1)
Basophils Relative: 0 %
Eosinophils Absolute: 0.2 10*3/uL (ref 0.0–0.5)
Eosinophils Relative: 2 %
HCT: 26 % — ABNORMAL LOW (ref 39.0–52.0)
Hemoglobin: 8.3 g/dL — ABNORMAL LOW (ref 13.0–17.0)
Immature Granulocytes: 1 %
Lymphocytes Relative: 5 %
Lymphs Abs: 0.4 10*3/uL — ABNORMAL LOW (ref 0.7–4.0)
MCH: 23.4 pg — ABNORMAL LOW (ref 26.0–34.0)
MCHC: 31.9 g/dL (ref 30.0–36.0)
MCV: 73.4 fL — ABNORMAL LOW (ref 80.0–100.0)
Monocytes Absolute: 0.5 10*3/uL (ref 0.1–1.0)
Monocytes Relative: 6 %
Neutro Abs: 6.9 10*3/uL (ref 1.7–7.7)
Neutrophils Relative %: 86 %
Platelet Count: 123 10*3/uL — ABNORMAL LOW (ref 150–400)
RBC: 3.54 MIL/uL — ABNORMAL LOW (ref 4.22–5.81)
RDW: 24.7 % — ABNORMAL HIGH (ref 11.5–15.5)
WBC Count: 8 10*3/uL (ref 4.0–10.5)
nRBC: 0.4 % — ABNORMAL HIGH (ref 0.0–0.2)

## 2021-06-20 LAB — CMP (CANCER CENTER ONLY)
ALT: 13 U/L (ref 0–44)
AST: 25 U/L (ref 15–41)
Albumin: 3.4 g/dL — ABNORMAL LOW (ref 3.5–5.0)
Alkaline Phosphatase: 44 U/L (ref 38–126)
Anion gap: 11 (ref 5–15)
BUN: 32 mg/dL — ABNORMAL HIGH (ref 8–23)
CO2: 26 mmol/L (ref 22–32)
Calcium: 8.6 mg/dL — ABNORMAL LOW (ref 8.9–10.3)
Chloride: 105 mmol/L (ref 98–111)
Creatinine: 1.8 mg/dL — ABNORMAL HIGH (ref 0.61–1.24)
GFR, Estimated: 37 mL/min — ABNORMAL LOW (ref >60)
Glucose, Bld: 89 mg/dL (ref 70–99)
Potassium: 3.8 mmol/L (ref 3.5–5.1)
Sodium: 142 mmol/L (ref 135–145)
Total Bilirubin: 0.6 mg/dL (ref 0.3–1.2)
Total Protein: 5.7 g/dL — ABNORMAL LOW (ref 6.5–8.1)

## 2021-06-20 LAB — SAMPLE TO BLOOD BANK

## 2021-06-20 MED ORDER — EPOETIN ALFA-EPBX 40000 UNIT/ML IJ SOLN
40000.0000 [IU] | Freq: Once | INTRAMUSCULAR | Status: AC
  Administered 2021-06-20: 40000 [IU] via SUBCUTANEOUS
  Filled 2021-06-20: qty 1

## 2021-06-20 NOTE — Patient Instructions (Signed)
Epoetin Alfa injection What is this medication? EPOETIN ALFA (e POE e tin AL fa) helps your body make more red blood cells. This medicine is used to treat anemia caused by chronic kidney disease, cancer chemotherapy, or HIV-therapy. It may also be used before surgery if you have anemia. This medicine may be used for other purposes; ask your health care provider or pharmacist if you have questions. COMMON BRAND NAME(S): Epogen, Procrit, Retacrit What should I tell my care team before I take this medication? They need to know if you have any of these conditions: cancer heart disease high blood pressure history of blood clots history of stroke low levels of folate, iron, or vitamin B12 in the blood seizures an unusual or allergic reaction to erythropoietin, albumin, benzyl alcohol, hamster proteins, other medicines, foods, dyes, or preservatives pregnant or trying to get pregnant breast-feeding How should I use this medication? This medicine is for injection into a vein or under the skin. It is usually given by a health care professional in a hospital or clinic setting. If you get this medicine at home, you will be taught how to prepare and give this medicine. Use exactly as directed. Take your medicine at regular intervals. Do not take your medicine more often than directed. It is important that you put your used needles and syringes in a special sharps container. Do not put them in a trash can. If you do not have a sharps container, call your pharmacist or healthcare provider to get one. A special MedGuide will be given to you by the pharmacist with each prescription and refill. Be sure to read this information carefully each time. Talk to your pediatrician regarding the use of this medicine in children. While this drug may be prescribed for selected conditions, precautions do apply. Overdosage: If you think you have taken too much of this medicine contact a poison control center or emergency  room at once. NOTE: This medicine is only for you. Do not share this medicine with others. What if I miss a dose? If you miss a dose, take it as soon as you can. If it is almost time for your next dose, take only that dose. Do not take double or extra doses. What may interact with this medication? Interactions have not been studied. This list may not describe all possible interactions. Give your health care provider a list of all the medicines, herbs, non-prescription drugs, or dietary supplements you use. Also tell them if you smoke, drink alcohol, or use illegal drugs. Some items may interact with your medicine. What should I watch for while using this medication? Your condition will be monitored carefully while you are receiving this medicine. You may need blood work done while you are taking this medicine. This medicine may cause a decrease in vitamin B6. You should make sure that you get enough vitamin B6 while you are taking this medicine. Discuss the foods you eat and the vitamins you take with your health care professional. What side effects may I notice from receiving this medication? Side effects that you should report to your doctor or health care professional as soon as possible: allergic reactions like skin rash, itching or hives, swelling of the face, lips, or tongue seizures signs and symptoms of a blood clot such as breathing problems; changes in vision; chest pain; severe, sudden headache; pain, swelling, warmth in the leg; trouble speaking; sudden numbness or weakness of the face, arm or leg signs and symptoms of a stroke like   changes in vision; confusion; trouble speaking or understanding; severe headaches; sudden numbness or weakness of the face, arm or leg; trouble walking; dizziness; loss of balance or coordination Side effects that usually do not require medical attention (report to your doctor or health care professional if they continue or are  bothersome): chills cough dizziness fever headaches joint pain muscle cramps muscle pain nausea, vomiting pain, redness, or irritation at site where injected This list may not describe all possible side effects. Call your doctor for medical advice about side effects. You may report side effects to FDA at 1-800-FDA-1088. Where should I keep my medication? Keep out of the reach of children. Store in a refrigerator between 2 and 8 degrees C (36 and 46 degrees F). Do not freeze or shake. Throw away any unused portion if using a single-dose vial. Multi-dose vials can be kept in the refrigerator for up to 21 days after the initial dose. Throw away unused medicine. NOTE: This sheet is a summary. It may not cover all possible information. If you have questions about this medicine, talk to your doctor, pharmacist, or health care provider.  2022 [employer]vier/Gold Standard (2017-03-30 08:35:19)  

## 2021-06-21 DIAGNOSIS — H353211 Exudative age-related macular degeneration, right eye, with active choroidal neovascularization: Secondary | ICD-10-CM | POA: Diagnosis not present

## 2021-06-28 DIAGNOSIS — E039 Hypothyroidism, unspecified: Secondary | ICD-10-CM | POA: Diagnosis not present

## 2021-07-05 ENCOUNTER — Other Ambulatory Visit: Payer: Self-pay | Admitting: Cardiovascular Disease

## 2021-07-09 NOTE — Progress Notes (Signed)
Patient Care Team: Lavone Orn, MD as PCP - General (Internal Medicine) Sueanne Margarita, MD as PCP - Cardiology (Cardiology) Sueanne Margarita, MD as Consulting Physician (Cardiology)  DIAGNOSIS:    ICD-10-CM   1. Other pancytopenia ([place])  K44.010       CHIEF COMPLIANT: Follow-up of anemia, thrombocytopenia  INTERVAL HISTORY: Joshua Hudson is a 86 y.o. with above-mentioned history of anemia and thrombocytopenia currently on treatment with Aranesp. Labs on 06/20/2021 showed WBC 8.0, RBC 3.54, Hgb 8.3, HCT 26.0, PLT 123. He presents to the clinic today for follow-up and to review his labs.  He reports to be feeling fatigued but not as terrible as he can sometimes feel.  If he gets 2 units of blood he feels really well but when he gets 1 unit of blood he does not feel as great sometimes.  He does have extensive bruises on his extremities which are his usual baseline.  ALLERGIES:  has No Known Allergies.  MEDICATIONS:  Current Outpatient Medications  Medication Sig Dispense Refill   amiodarone (PACERONE) 200 MG tablet Take 1 tablet (200 mg total) by mouth daily. Pt needs to make appt with provider for further refills - 1st attempt 90 tablet 0   ELIQUIS 2.5 MG TABS tablet TAKE 1 TABLET(2.5 MG) BY MOUTH TWICE DAILY 180 tablet 1   finasteride (PROSCAR) 5 MG tablet Take 5 mg by mouth daily as needed (retention).      folic acid (FOLVITE) 1 MG tablet Take 1 mg by mouth daily.     furosemide (LASIX) 40 MG tablet Take 1 tablet (40 mg total) by mouth daily as needed. Pt needs to make appt with provider for further refills - 2nd attempt 15 tablet 0   leflunomide (ARAVA) 20 MG tablet Take 20 mg by mouth daily.     levothyroxine (SYNTHROID) 75 MCG tablet 1 tablet in the morning on an empty stomach     Multiple Vitamin (MULTIVITAMIN WITH MINERALS) TABS tablet Take 1 tablet by mouth daily.     pantoprazole (PROTONIX) 40 MG tablet Take 40 mg by mouth daily before breakfast.      Polyethyl  Glycol-Propyl Glycol (LUBRICANT EYE DROPS) 0.4-0.3 % SOLN Place 1 drop into both eyes 3 (three) times daily as needed (dry/irritated eyes.).     potassium chloride SA (KLOR-CON) 20 MEQ tablet Take 1 tablet (20 mEq total) by mouth daily. NEED OV. 90 tablet 0   predniSONE (DELTASONE) 5 MG tablet Take 5 mg by mouth daily with breakfast.      torsemide (DEMADEX) 20 MG tablet 1 tablet     traMADol (ULTRAM) 50 MG tablet Take 50 mg by mouth every 6 (six) hours as needed.     No current facility-administered medications for this visit.    PHYSICAL EXAMINATION: ECOG PERFORMANCE STATUS: 1 - Symptomatic but completely ambulatory  Vitals:   07/11/21 0820  BP: (!) 107/59  Pulse: (!) 55  Resp: 18  Temp: (!) 97.5 F (36.4 C)  SpO2: 99%   Filed Weights   07/11/21 0820  Weight: 108 lb 12.8 oz (49.4 kg)    LABORATORY DATA:  I have reviewed the data as listed CMP Latest Ref Rng & Units 06/20/2021 05/30/2021 05/10/2021  Glucose 70 - 99 mg/dL 89 95 125(H)  BUN 8 - 23 mg/dL 32(H) 38(H) 34(H)  Creatinine 0.61 - 1.24 mg/dL 1.80(H) 2.07(H) 2.04(H)  Sodium 135 - 145 mmol/L 142 140 144  Potassium 3.5 - 5.1 mmol/L 3.8 3.8 3.6  Chloride 98 - 111 mmol/L 105 104 105  CO2 22 - 32 mmol/L 26 26 26   Calcium 8.9 - 10.3 mg/dL 8.6(L) 8.7(L) 9.1  Total Protein 6.5 - 8.1 g/dL 5.7(L) 5.6(L) 6.0(L)  Total Bilirubin 0.3 - 1.2 mg/dL 0.6 0.4 0.5  Alkaline Phos 38 - 126 U/L 44 42 48  AST 15 - 41 U/L 25 23 17   ALT 0 - 44 U/L 13 9 12     Lab Results  Component Value Date   WBC 6.4 07/11/2021   HGB 7.6 (L) 07/11/2021   HCT 24.8 (L) 07/11/2021   MCV 72.7 (L) 07/11/2021   PLT 117 (L) 07/11/2021   NEUTROABS 4.2 07/11/2021    ASSESSMENT & PLAN:  Other pancytopenia ([facility]) Hemoglobin electrophoresis: Beta thalassemia minor.  This is the cause of microcytosis.  It is not iron deficiency related.   Possible differential: Anemia of chronic disease versus myelodysplastic syndrome versus methotrexate related toxicity. His  methotrexate has been on hold.  However his hemoglobin continues to remain low   Hospitalization 09/22/2019-09/25/2019: A. fib with RVR status post cardioversion, currently on Eliquis   Lab review: 09/25/2019: WBC 7.2, hemoglobin 9.6, platelets 145 10/03/19: WBC: 7.5, Hb 8.9, Pl: 135 03/31/2020: WBC 7.9, Hb 9.2, platelets 125 06/24/2020: Hemoglobin 7.4 (received blood transfusion) 07/26/2020: Hemoglobin 7.9 (blood transfusion) 08/09/2020: Hemoglobin 9.2 (Retacrit injection) 09/21/2020: Hemoglobin 8.1 (Retacrit injection) 11/23/20: Hb 6.4 (Retacrit and 11/24/20 Blood transfusion) 01/24/21: Hemoglobin 7.3 (Retacrit and blood) 02/14/2021: Hemoglobin 8.7 (Retacrit) 03/08/2021: Hemoglobin 8.4 (Retacrit) 04/18/2021: Hemoglobin 8.7 (Retacrit), blood transfusion on 04/04/2021 for hemoglobin of 7.7  05/30/21: Hemoglobin 6.7, blood transfusion, platelets 85 (will need to be watched) 07/11/2021: Hemoglobin 7.6 (1 unit of PRBC)   His anemia is due to chronic kidney disease stage III.   I discussed with him that we are trying to decrease the amount of blood transfusion necessary because of risks of iron overload as well as risk of developing antibodies.  Therefore he might need 1 unit alternating with 2 units periodically.  Goal: To get hemoglobin above 8 1 unit PRBC for hemoglobin 7-8 2 units PRBC for hemoglobin less than 7  Current treatment plan: Retacrit injections, blood transfusions as needed (when hemoglobin drops below 8/severe symptoms) Return to clinic every 3 weeks for Retacrit injection   Every 6 weeks to follow-up with me with labs    No orders of the defined types were placed in this encounter.  The patient has a good understanding of the overall plan. he agrees with it. he will call with any problems that may develop before the next visit here.  Total time spent: 20 mins including face to face time and time spent for planning, charting and coordination of care  [name], MD,  MP[facility] 07/11/2021  I, Thana Ates, am acting as scribe for Dr. Nicholas Lose.  I have reviewed the above documentation for accuracy and completeness, and I agree with the above.

## 2021-07-11 ENCOUNTER — Encounter: Payer: Self-pay | Admitting: *Deleted

## 2021-07-11 ENCOUNTER — Inpatient Hospital Stay: Payer: Medicare PPO | Attending: Hematology and Oncology | Admitting: Hematology and Oncology

## 2021-07-11 ENCOUNTER — Inpatient Hospital Stay: Payer: Medicare PPO

## 2021-07-11 ENCOUNTER — Other Ambulatory Visit: Payer: Self-pay

## 2021-07-11 DIAGNOSIS — D638 Anemia in other chronic diseases classified elsewhere: Secondary | ICD-10-CM

## 2021-07-11 DIAGNOSIS — D61818 Other pancytopenia: Secondary | ICD-10-CM | POA: Diagnosis not present

## 2021-07-11 DIAGNOSIS — D631 Anemia in chronic kidney disease: Secondary | ICD-10-CM | POA: Insufficient documentation

## 2021-07-11 DIAGNOSIS — D696 Thrombocytopenia, unspecified: Secondary | ICD-10-CM

## 2021-07-11 DIAGNOSIS — D563 Thalassemia minor: Secondary | ICD-10-CM | POA: Diagnosis not present

## 2021-07-11 DIAGNOSIS — N1832 Chronic kidney disease, stage 3b: Secondary | ICD-10-CM | POA: Diagnosis not present

## 2021-07-11 LAB — CMP (CANCER CENTER ONLY)
ALT: 11 U/L (ref 0–44)
AST: 22 U/L (ref 15–41)
Albumin: 3.4 g/dL — ABNORMAL LOW (ref 3.5–5.0)
Alkaline Phosphatase: 46 U/L (ref 38–126)
Anion gap: 8 (ref 5–15)
BUN: 34 mg/dL — ABNORMAL HIGH (ref 8–23)
CO2: 28 mmol/L (ref 22–32)
Calcium: 8.2 mg/dL — ABNORMAL LOW (ref 8.9–10.3)
Chloride: 105 mmol/L (ref 98–111)
Creatinine: 1.94 mg/dL — ABNORMAL HIGH (ref 0.61–1.24)
GFR, Estimated: 34 mL/min — ABNORMAL LOW (ref >60)
Glucose, Bld: 76 mg/dL (ref 70–99)
Potassium: 3.5 mmol/L (ref 3.5–5.1)
Sodium: 141 mmol/L (ref 135–145)
Total Bilirubin: 0.5 mg/dL (ref 0.3–1.2)
Total Protein: 5.6 g/dL — ABNORMAL LOW (ref 6.5–8.1)

## 2021-07-11 LAB — CBC WITH DIFFERENTIAL (CANCER CENTER ONLY)
Abs Immature Granulocytes: 0.03 10*3/uL (ref 0.00–0.07)
Basophils Absolute: 0 10*3/uL (ref 0.0–0.1)
Basophils Relative: 1 %
Eosinophils Absolute: 0.4 10*3/uL (ref 0.0–0.5)
Eosinophils Relative: 7 %
HCT: 24.8 % — ABNORMAL LOW (ref 39.0–52.0)
Hemoglobin: 7.6 g/dL — ABNORMAL LOW (ref 13.0–17.0)
Immature Granulocytes: 1 %
Lymphocytes Relative: 15 %
Lymphs Abs: 1 10*3/uL (ref 0.7–4.0)
MCH: 22.3 pg — ABNORMAL LOW (ref 26.0–34.0)
MCHC: 30.6 g/dL (ref 30.0–36.0)
MCV: 72.7 fL — ABNORMAL LOW (ref 80.0–100.0)
Monocytes Absolute: 0.7 10*3/uL (ref 0.1–1.0)
Monocytes Relative: 11 %
Neutro Abs: 4.2 10*3/uL (ref 1.7–7.7)
Neutrophils Relative %: 65 %
Platelet Count: 117 10*3/uL — ABNORMAL LOW (ref 150–400)
RBC: 3.41 MIL/uL — ABNORMAL LOW (ref 4.22–5.81)
RDW: 22.7 % — ABNORMAL HIGH (ref 11.5–15.5)
WBC Count: 6.4 10*3/uL (ref 4.0–10.5)
nRBC: 0.5 % — ABNORMAL HIGH (ref 0.0–0.2)

## 2021-07-11 LAB — SAMPLE TO BLOOD BANK

## 2021-07-11 LAB — PREPARE RBC (CROSSMATCH)

## 2021-07-11 MED ORDER — SODIUM CHLORIDE 0.9% IV SOLUTION
250.0000 mL | Freq: Once | INTRAVENOUS | Status: AC
  Administered 2021-07-11: 250 mL via INTRAVENOUS

## 2021-07-11 MED ORDER — DIPHENHYDRAMINE HCL 25 MG PO CAPS
25.0000 mg | ORAL_CAPSULE | Freq: Once | ORAL | Status: AC
  Administered 2021-07-11: 25 mg via ORAL
  Filled 2021-07-11: qty 1

## 2021-07-11 MED ORDER — ACETAMINOPHEN 325 MG PO TABS
650.0000 mg | ORAL_TABLET | Freq: Once | ORAL | Status: AC
  Administered 2021-07-11: 650 mg via ORAL
  Filled 2021-07-11: qty 2

## 2021-07-11 NOTE — Progress Notes (Signed)
Pt received 1 unit of blood. After 10 min of blood starting, pt IV infiltrated and had to be replaced. Pt tolerated well. Warm pack was placed on site. Restarted prbc at 1050 and finished at 1223. Pt was appreciative and advised to call for any concerns. Pt verbalized understanding. AVS was printed and given to pt. Pt ambulated out of facility.

## 2021-07-11 NOTE — Assessment & Plan Note (Signed)
Hemoglobin electrophoresis: Beta thalassemia minor.This is the cause of microcytosis. It is not iron deficiency related.  Possible differential:Anemia of chronic disease versus myelodysplastic syndrome versus methotrexate related toxicity. His methotrexate has been on hold.However his hemoglobin continues to remain low  Hospitalization 09/22/2019-09/25/2019: A. fib with RVR status post cardioversion, currently on Eliquis  Lab review: 09/25/2019: WBC 7.2, hemoglobin 9.6, platelets 145 10/03/19: WBC: 7.5, Hb 8.9, Pl: 135 03/31/2020:WBC 7.9, Hb 9.2,platelets 125 06/24/2020: Hemoglobin 7.4 (received blood transfusion) 07/26/2020: Hemoglobin 7.9 (bloodtransfusion) 08/09/2020:Hemoglobin 9.2 (Retacritinjection) 09/21/2020: Hemoglobin 8.1 (Retacrit injection) 11/23/20: Hb 6.4 (Retacrit and 11/24/20 Bloodtransfusion) 01/24/21: Hemoglobin7.3 (Retacritand blood) 02/14/2021: Hemoglobin 8.7 (Retacrit) 03/08/2021:Hemoglobin 8.4 (Retacrit) 04/18/2021: Hemoglobin 8.7 (Retacrit), blood transfusion on 04/04/2021 for hemoglobin of 7.7  05/30/21: Hemoglobin 6.7, blood transfusion, platelets 85 (will need to be watched) 07/11/2021:  His anemia is due to chronickidneydiseasestage III.  Current treatment plan:Retacritinjections, blood transfusions as needed(when hemoglobin drops below8/severe symptoms) Return to clinic every 3 weeks forRetacritinjection  Every 6 weeks to follow-up with me with labs

## 2021-07-11 NOTE — Progress Notes (Signed)
Standing orders received by MD for pt to receive 1 unit PRBC's when Hgb is between 7.0 and 8.0.  Pt will receive 2 units of PRBC's if Hgb is less than 7.0.

## 2021-07-11 NOTE — Progress Notes (Signed)
Hgb 7.6 per MD pt to receive 1 unit PRBC today.  Orders placed

## 2021-07-12 LAB — BPAM RBC
Blood Product Expiration Date: 202211222359
ISSUE DATE / TIME: 202211071005
Unit Type and Rh: 6200

## 2021-07-12 LAB — TYPE AND SCREEN
ABO/RH(D): A POS
Antibody Screen: NEGATIVE
Unit division: 0

## 2021-07-14 DIAGNOSIS — M0579 Rheumatoid arthritis with rheumatoid factor of multiple sites without organ or systems involvement: Secondary | ICD-10-CM | POA: Diagnosis not present

## 2021-07-19 ENCOUNTER — Other Ambulatory Visit: Payer: Self-pay | Admitting: Cardiovascular Disease

## 2021-07-22 ENCOUNTER — Other Ambulatory Visit: Payer: Self-pay

## 2021-07-22 MED ORDER — AMIODARONE HCL 200 MG PO TABS: 200.0000 mg | ORAL_TABLET | Freq: Every day | ORAL | 0 refills | Status: DC

## 2021-07-26 DIAGNOSIS — H353211 Exudative age-related macular degeneration, right eye, with active choroidal neovascularization: Secondary | ICD-10-CM | POA: Diagnosis not present

## 2021-08-03 ENCOUNTER — Inpatient Hospital Stay: Payer: Medicare PPO

## 2021-08-03 ENCOUNTER — Other Ambulatory Visit: Payer: Self-pay

## 2021-08-03 ENCOUNTER — Other Ambulatory Visit: Payer: Self-pay | Admitting: Hematology and Oncology

## 2021-08-03 VITALS — BP 153/89 | HR 53 | Temp 97.9°F | Resp 18

## 2021-08-03 DIAGNOSIS — D563 Thalassemia minor: Secondary | ICD-10-CM | POA: Diagnosis not present

## 2021-08-03 DIAGNOSIS — D696 Thrombocytopenia, unspecified: Secondary | ICD-10-CM

## 2021-08-03 DIAGNOSIS — D598 Other acquired hemolytic anemias: Secondary | ICD-10-CM

## 2021-08-03 DIAGNOSIS — N1832 Chronic kidney disease, stage 3b: Secondary | ICD-10-CM | POA: Diagnosis not present

## 2021-08-03 DIAGNOSIS — I5033 Acute on chronic diastolic (congestive) heart failure: Secondary | ICD-10-CM

## 2021-08-03 DIAGNOSIS — D638 Anemia in other chronic diseases classified elsewhere: Secondary | ICD-10-CM

## 2021-08-03 DIAGNOSIS — D631 Anemia in chronic kidney disease: Secondary | ICD-10-CM | POA: Diagnosis not present

## 2021-08-03 LAB — CBC WITH DIFFERENTIAL (CANCER CENTER ONLY)
Abs Immature Granulocytes: 0.02 10*3/uL (ref 0.00–0.07)
Basophils Absolute: 0 10*3/uL (ref 0.0–0.1)
Basophils Relative: 0 %
Eosinophils Absolute: 0.5 10*3/uL (ref 0.0–0.5)
Eosinophils Relative: 6 %
HCT: 23.1 % — ABNORMAL LOW (ref 39.0–52.0)
Hemoglobin: 7.3 g/dL — ABNORMAL LOW (ref 13.0–17.0)
Immature Granulocytes: 0 %
Lymphocytes Relative: 14 %
Lymphs Abs: 1.1 10*3/uL (ref 0.7–4.0)
MCH: 22.7 pg — ABNORMAL LOW (ref 26.0–34.0)
MCHC: 31.6 g/dL (ref 30.0–36.0)
MCV: 72 fL — ABNORMAL LOW (ref 80.0–100.0)
Monocytes Absolute: 0.9 10*3/uL (ref 0.1–1.0)
Monocytes Relative: 12 %
Neutro Abs: 5.3 10*3/uL (ref 1.7–7.7)
Neutrophils Relative %: 68 %
Platelet Count: 150 10*3/uL (ref 150–400)
RBC: 3.21 MIL/uL — ABNORMAL LOW (ref 4.22–5.81)
RDW: 22 % — ABNORMAL HIGH (ref 11.5–15.5)
WBC Count: 7.8 10*3/uL (ref 4.0–10.5)
nRBC: 0.4 % — ABNORMAL HIGH (ref 0.0–0.2)

## 2021-08-03 LAB — SAMPLE TO BLOOD BANK

## 2021-08-03 LAB — CMP (CANCER CENTER ONLY)
ALT: 13 U/L (ref 0–44)
AST: 23 U/L (ref 15–41)
Albumin: 3.5 g/dL (ref 3.5–5.0)
Alkaline Phosphatase: 45 U/L (ref 38–126)
Anion gap: 9 (ref 5–15)
BUN: 35 mg/dL — ABNORMAL HIGH (ref 8–23)
CO2: 27 mmol/L (ref 22–32)
Calcium: 8.5 mg/dL — ABNORMAL LOW (ref 8.9–10.3)
Chloride: 103 mmol/L (ref 98–111)
Creatinine: 1.79 mg/dL — ABNORMAL HIGH (ref 0.61–1.24)
GFR, Estimated: 37 mL/min — ABNORMAL LOW (ref >60)
Glucose, Bld: 77 mg/dL (ref 70–99)
Potassium: 3.5 mmol/L (ref 3.5–5.1)
Sodium: 139 mmol/L (ref 135–145)
Total Bilirubin: 0.6 mg/dL (ref 0.3–1.2)
Total Protein: 5.7 g/dL — ABNORMAL LOW (ref 6.5–8.1)

## 2021-08-03 LAB — PREPARE RBC (CROSSMATCH)

## 2021-08-03 MED ORDER — ACETAMINOPHEN 325 MG PO TABS
650.0000 mg | ORAL_TABLET | Freq: Once | ORAL | Status: AC
  Administered 2021-08-03: 650 mg via ORAL
  Filled 2021-08-03: qty 2

## 2021-08-03 MED ORDER — DIPHENHYDRAMINE HCL 25 MG PO CAPS
25.0000 mg | ORAL_CAPSULE | Freq: Once | ORAL | Status: AC
  Administered 2021-08-03: 25 mg via ORAL
  Filled 2021-08-03: qty 1

## 2021-08-03 MED ORDER — SODIUM CHLORIDE 0.9% IV SOLUTION
250.0000 mL | Freq: Once | INTRAVENOUS | Status: AC
  Administered 2021-08-03: 250 mL via INTRAVENOUS

## 2021-08-03 MED ORDER — EPOETIN ALFA-EPBX 40000 UNIT/ML IJ SOLN
40000.0000 [IU] | Freq: Once | INTRAMUSCULAR | Status: AC
  Administered 2021-08-03: 40000 [IU] via SUBCUTANEOUS
  Filled 2021-08-03: qty 1

## 2021-08-03 NOTE — Patient Instructions (Signed)
Blood Transfusion, Adult, Care After This sheet gives you information about how to care for yourself after your procedure. Your doctor may also give you more specific instructions. If you have problems or questions, contact your doctor. What can I expect after the procedure? After the procedure, it is common to have: Bruising and soreness at the IV site. A headache. Follow these instructions at home: Insertion site care   Follow instructions from your doctor about how to take care of your insertion site. This is where an IV tube was put into your vein. Make sure you: Wash your hands with soap and water before and after you change your bandage (dressing). If you cannot use soap and water, use hand sanitizer. Change your bandage as told by your doctor. Check your insertion site every day for signs of infection. Check for: Redness, swelling, or pain. Bleeding from the site. Warmth. Pus or a bad smell. General instructions Take over-the-counter and prescription medicines only as told by your doctor. Rest as told by your doctor. Go back to your normal activities as told by your doctor. Keep all follow-up visits as told by your doctor. This is important. Contact a doctor if: You have itching or red, swollen areas of skin (hives). You feel worried or nervous (anxious). You feel weak after doing your normal activities. You have redness, swelling, warmth, or pain around the insertion site. You have blood coming from the insertion site, and the blood does not stop with pressure. You have pus or a bad smell coming from the insertion site. Get help right away if: You have signs of a serious reaction. This may be coming from an allergy or the body's defense system (immune system). Signs include: Trouble breathing or shortness of breath. Swelling of the face or feeling warm (flushed). Fever or chills. Head, chest, or back pain. Dark pee (urine) or blood in the pee. Widespread rash. Fast  heartbeat. Feeling dizzy or light-headed. You may receive your blood transfusion in an outpatient setting. If so, you will be told whom to contact to report any reactions. These symptoms may be an emergency. Do not wait to see if the symptoms will go away. Get medical help right away. Call your local emergency services (911 in the [place] Do not drive yourself to the hospital. Summary Bruising and soreness at the IV site are common. Check your insertion site every day for signs of infection. Rest as told by your doctor. Go back to your normal activities as told by your doctor. Get help right away if you have signs of a serious reaction. This information is not intended to replace advice given to you by your health care provider. Make sure you discuss any questions you have with your health care provider. Document Revised: 12/16/2020 Document Reviewed: 02/13/2019 Elsevier Patient Education  2022 Elsevier Inc.  

## 2021-08-04 LAB — TYPE AND SCREEN
ABO/RH(D): A POS
Antibody Screen: NEGATIVE
Unit division: 0
Unit division: 0

## 2021-08-04 LAB — BPAM RBC
Blood Product Expiration Date: 202212141401
Blood Product Expiration Date: 202212142359
ISSUE DATE / TIME: 202211301030
ISSUE DATE / TIME: 202211301030
Unit Type and Rh: 6200
Unit Type and Rh: 6200

## 2021-08-08 ENCOUNTER — Telehealth: Payer: Self-pay | Admitting: Hematology and Oncology

## 2021-08-08 DIAGNOSIS — H353122 Nonexudative age-related macular degeneration, left eye, intermediate dry stage: Secondary | ICD-10-CM | POA: Diagnosis not present

## 2021-08-08 DIAGNOSIS — H35363 Drusen (degenerative) of macula, bilateral: Secondary | ICD-10-CM | POA: Diagnosis not present

## 2021-08-08 DIAGNOSIS — H353211 Exudative age-related macular degeneration, right eye, with active choroidal neovascularization: Secondary | ICD-10-CM | POA: Diagnosis not present

## 2021-08-08 DIAGNOSIS — Z961 Presence of intraocular lens: Secondary | ICD-10-CM | POA: Diagnosis not present

## 2021-08-08 NOTE — Telephone Encounter (Signed)
Scheduled per sch msg. Called and left msg  

## 2021-08-22 NOTE — Progress Notes (Signed)
Patient Care Team: Lavone Orn, MD as PCP - General (Internal Medicine) Sueanne Margarita, MD as PCP - Cardiology (Cardiology) Sueanne Margarita, MD as Consulting Physician (Cardiology)  DIAGNOSIS:    ICD-10-CM   1. Anemia of chronic disease  D63.8       CHIEF COMPLIANT: Follow-up of anemia, thrombocytopenia  INTERVAL HISTORY: Joshua Hudson is a 86 y.o. with above-mentioned history of anemia and thrombocytopenia currently on treatment with Aranesp. Labs on 08/03/2021 showed WBC 7.8, RBC 3.21, Hgb 7.3, HCT 23.1, PLT 150. He presents to the clinic today for follow-up and to review his labs.  He continues to feel fatigued and weak.  Even though his hemoglobin today is 9.6 he does not feel any different than when he was 7.3.  ALLERGIES:  has No Known Allergies.  MEDICATIONS:  Current Outpatient Medications  Medication Sig Dispense Refill   amiodarone (PACERONE) 200 MG tablet Take 1 tablet (200 mg total) by mouth daily. Please keep upcoming appt in January 2023 with Dr. Lovena Le before anymore refills. Thank you 90 tablet 0   ELIQUIS 2.5 MG TABS tablet TAKE 1 TABLET(2.5 MG) BY MOUTH TWICE DAILY 180 tablet 1   finasteride (PROSCAR) 5 MG tablet Take 5 mg by mouth daily as needed (retention).      folic acid (FOLVITE) 1 MG tablet Take 1 mg by mouth daily.     furosemide (LASIX) 40 MG tablet Take 1 tablet (40 mg total) by mouth daily as needed. Pt needs to make appt with provider for further refills - 2nd attempt 15 tablet 0   leflunomide (ARAVA) 20 MG tablet Take 20 mg by mouth daily.     levothyroxine (SYNTHROID) 75 MCG tablet 1 tablet in the morning on an empty stomach     Multiple Vitamin (MULTIVITAMIN WITH MINERALS) TABS tablet Take 1 tablet by mouth daily.     pantoprazole (PROTONIX) 40 MG tablet Take 40 mg by mouth daily before breakfast.      Polyethyl Glycol-Propyl Glycol (LUBRICANT EYE DROPS) 0.4-0.3 % SOLN Place 1 drop into both eyes 3 (three) times daily as needed (dry/irritated  eyes.).     potassium chloride SA (KLOR-CON) 20 MEQ tablet Take 1 tablet (20 mEq total) by mouth daily. NEED OV. 90 tablet 0   predniSONE (DELTASONE) 5 MG tablet Take 5 mg by mouth daily with breakfast.      torsemide (DEMADEX) 20 MG tablet 1 tablet     traMADol (ULTRAM) 50 MG tablet Take 50 mg by mouth every 6 (six) hours as needed.     No current facility-administered medications for this visit.    PHYSICAL EXAMINATION: ECOG PERFORMANCE STATUS: 1 - Symptomatic but completely ambulatory  Vitals:   08/23/21 0843  BP: 127/71  Pulse: (!) 58  Resp: 18  Temp: 97.7 F (36.5 C)  SpO2: 100%   Filed Weights   08/23/21 0843  Weight: 108 lb 4 oz (49.1 kg)     LABORATORY DATA:  I have reviewed the data as listed CMP Latest Ref Rng & Units 08/03/2021 07/11/2021 06/20/2021  Glucose 70 - 99 mg/dL 77 76 89  BUN 8 - 23 mg/dL 35(H) 34(H) 32(H)  Creatinine 0.61 - 1.24 mg/dL 1.79(H) 1.94(H) 1.80(H)  Sodium 135 - 145 mmol/L 139 141 142  Potassium 3.5 - 5.1 mmol/L 3.5 3.5 3.8  Chloride 98 - 111 mmol/L 103 105 105  CO2 22 - 32 mmol/L 27 28 26   Calcium 8.9 - 10.3 mg/dL 8.5(L) 8.2(L) 8.6(L)  Total Protein 6.5 - 8.1 g/dL 5.7(L) 5.6(L) 5.7(L)  Total Bilirubin 0.3 - 1.2 mg/dL 0.6 0.5 0.6  Alkaline Phos 38 - 126 U/L 45 46 44  AST 15 - 41 U/L 23 22 25   ALT 0 - 44 U/L 13 11 13     Lab Results  Component Value Date   WBC 7.9 08/23/2021   HGB 9.6 (L) 08/23/2021   HCT 31.0 (L) 08/23/2021   MCV 74.7 (L) 08/23/2021   PLT 110 (L) 08/23/2021   NEUTROABS 6.6 08/23/2021    ASSESSMENT & PLAN:  Anemia of chronic disease Hemoglobin electrophoresis: Beta thalassemia minor.  This is the cause of microcytosis.  It is not iron deficiency related.   Possible differential: Anemia of chronic disease versus myelodysplastic syndrome versus methotrexate related toxicity. His methotrexate has been on hold.  However his hemoglobin continues to remain low   Hospitalization 09/22/2019-09/25/2019: A. fib with RVR  status post cardioversion, currently on Eliquis   Lab review: 09/25/2019: WBC 7.2, hemoglobin 9.6, platelets 145 10/03/19: WBC: 7.5, Hb 8.9, Pl: 135 03/31/2020: WBC 7.9, Hb 9.2, platelets 125 06/24/2020: Hemoglobin 7.4 (received blood transfusion) 07/26/2020: Hemoglobin 7.9 (blood transfusion) 08/09/2020: Hemoglobin 9.2 (Retacrit injection) 09/21/2020: Hemoglobin 8.1 (Retacrit injection) 11/23/20: Hb 6.4 (Retacrit and 11/24/20 Blood transfusion) 01/24/21: Hemoglobin 7.3 (Retacrit and blood) 02/14/2021: Hemoglobin 8.7 (Retacrit) 03/08/2021: Hemoglobin 8.4 (Retacrit) 04/18/2021: Hemoglobin 8.7 (Retacrit), blood transfusion on 04/04/2021 for hemoglobin of 7.7  05/30/21: Hemoglobin 6.7, blood transfusion, platelets 85 (will need to be watched) 07/11/2021: Hemoglobin 7.6 (1 unit of PRBC) 08/03/2021: Hemoglobin 7.3 (Retacrit monthly) 08/23/2021: Hemoglobin 9.6 (Retacrit)   His anemia is due to chronic kidney disease stage III.    Return to clinic in 3 weeks for labs and blood transfusion if needed. I will see him back on 10/04/2021   No orders of the defined types were placed in this encounter.  The patient has a good understanding of the overall plan. he agrees with it. he will call with any problems that may develop before the next visit here.  Total time spent: 20 mins including face to face time and time spent for planning, charting and coordination of care  [name], MD, MPH 08/23/2021  I, Thana Ates, am acting as scribe for Dr. Nicholas Lose.  I have reviewed the above documentation for accuracy and completeness, and I agree with the above.

## 2021-08-23 ENCOUNTER — Inpatient Hospital Stay: Payer: Medicare PPO

## 2021-08-23 ENCOUNTER — Inpatient Hospital Stay: Payer: Medicare PPO | Attending: Hematology and Oncology | Admitting: Hematology and Oncology

## 2021-08-23 ENCOUNTER — Other Ambulatory Visit: Payer: Self-pay

## 2021-08-23 DIAGNOSIS — D638 Anemia in other chronic diseases classified elsewhere: Secondary | ICD-10-CM

## 2021-08-23 DIAGNOSIS — D563 Thalassemia minor: Secondary | ICD-10-CM | POA: Diagnosis not present

## 2021-08-23 DIAGNOSIS — D696 Thrombocytopenia, unspecified: Secondary | ICD-10-CM

## 2021-08-23 DIAGNOSIS — N1832 Chronic kidney disease, stage 3b: Secondary | ICD-10-CM | POA: Diagnosis not present

## 2021-08-23 DIAGNOSIS — D649 Anemia, unspecified: Secondary | ICD-10-CM | POA: Diagnosis not present

## 2021-08-23 DIAGNOSIS — D631 Anemia in chronic kidney disease: Secondary | ICD-10-CM | POA: Diagnosis not present

## 2021-08-23 LAB — CBC WITH DIFFERENTIAL (CANCER CENTER ONLY)
Abs Immature Granulocytes: 0.04 10*3/uL (ref 0.00–0.07)
Basophils Absolute: 0.1 10*3/uL (ref 0.0–0.1)
Basophils Relative: 1 %
Eosinophils Absolute: 0.2 10*3/uL (ref 0.0–0.5)
Eosinophils Relative: 3 %
HCT: 31 % — ABNORMAL LOW (ref 39.0–52.0)
Hemoglobin: 9.6 g/dL — ABNORMAL LOW (ref 13.0–17.0)
Immature Granulocytes: 1 %
Lymphocytes Relative: 7 %
Lymphs Abs: 0.6 10*3/uL — ABNORMAL LOW (ref 0.7–4.0)
MCH: 23.1 pg — ABNORMAL LOW (ref 26.0–34.0)
MCHC: 31 g/dL (ref 30.0–36.0)
MCV: 74.7 fL — ABNORMAL LOW (ref 80.0–100.0)
Monocytes Absolute: 0.5 10*3/uL (ref 0.1–1.0)
Monocytes Relative: 6 %
Neutro Abs: 6.6 10*3/uL (ref 1.7–7.7)
Neutrophils Relative %: 82 %
Platelet Count: 110 10*3/uL — ABNORMAL LOW (ref 150–400)
RBC: 4.15 MIL/uL — ABNORMAL LOW (ref 4.22–5.81)
RDW: 21.1 % — ABNORMAL HIGH (ref 11.5–15.5)
Smear Review: NORMAL
WBC Count: 7.9 10*3/uL (ref 4.0–10.5)
nRBC: 0 % (ref 0.0–0.2)

## 2021-08-23 LAB — SAMPLE TO BLOOD BANK

## 2021-08-23 MED ORDER — EPOETIN ALFA-EPBX 40000 UNIT/ML IJ SOLN
40000.0000 [IU] | Freq: Once | INTRAMUSCULAR | Status: AC
  Administered 2021-08-23: 10:00:00 40000 [IU] via SUBCUTANEOUS
  Filled 2021-08-23: qty 1

## 2021-08-23 NOTE — Patient Instructions (Signed)
Epoetin Alfa injection °What is this medication? °EPOETIN ALFA (e POE e tin AL fa) helps your body make more red blood cells. This medicine is used to treat anemia caused by chronic kidney disease, cancer chemotherapy, or HIV-therapy. It may also be used before surgery if you have anemia. °This medicine may be used for other purposes; ask your health care provider or pharmacist if you have questions. °COMMON BRAND NAME(S): Epogen, Procrit, Retacrit °What should I tell my care team before I take this medication? °They need to know if you have any of these conditions: °cancer °heart disease °high blood pressure °history of blood clots °history of stroke °low levels of folate, iron, or vitamin B12 in the blood °seizures °an unusual or allergic reaction to erythropoietin, albumin, benzyl alcohol, hamster proteins, other medicines, foods, dyes, or preservatives °pregnant or trying to get pregnant °breast-feeding °How should I use this medication? °This medicine is for injection into a vein or under the skin. It is usually given by a health care professional in a hospital or clinic setting. °If you get this medicine at home, you will be taught how to prepare and give this medicine. Use exactly as directed. Take your medicine at regular intervals. Do not take your medicine more often than directed. °It is important that you put your used needles and syringes in a special sharps container. Do not put them in a trash can. If you do not have a sharps container, call your pharmacist or healthcare provider to get one. °A special MedGuide will be given to you by the pharmacist with each prescription and refill. Be sure to read this information carefully each time. °Talk to your pediatrician regarding the use of this medicine in children. While this drug may be prescribed for selected conditions, precautions do apply. °Overdosage: If you think you have taken too much of this medicine contact a poison control center or emergency  room at once. °NOTE: This medicine is only for you. Do not share this medicine with others. °What if I miss a dose? °If you miss a dose, take it as soon as you can. If it is almost time for your next dose, take only that dose. Do not take double or extra doses. °What may interact with this medication? °Interactions have not been studied. °This list may not describe all possible interactions. Give your health care provider a list of all the medicines, herbs, non-prescription drugs, or dietary supplements you use. Also tell them if you smoke, drink alcohol, or use illegal drugs. Some items may interact with your medicine. °What should I watch for while using this medication? °Your condition will be monitored carefully while you are receiving this medicine. °You may need blood work done while you are taking this medicine. °This medicine may cause a decrease in vitamin B6. You should make sure that you get enough vitamin B6 while you are taking this medicine. Discuss the foods you eat and the vitamins you take with your health care professional. °What side effects may I notice from receiving this medication? °Side effects that you should report to your doctor or health care professional as soon as possible: °allergic reactions like skin rash, itching or hives, swelling of the face, lips, or tongue °seizures °signs and symptoms of a blood clot such as breathing problems; changes in vision; chest pain; severe, sudden headache; pain, swelling, warmth in the leg; trouble speaking; sudden numbness or weakness of the face, arm or leg °signs and symptoms of a stroke like   changes in vision; confusion; trouble speaking or understanding; severe headaches; sudden numbness or weakness of the face, arm or leg; trouble walking; dizziness; loss of balance or coordination °Side effects that usually do not require medical attention (report to your doctor or health care professional if they continue or are  bothersome): °chills °cough °dizziness °fever °headaches °joint pain °muscle cramps °muscle pain °nausea, vomiting °pain, redness, or irritation at site where injected °This list may not describe all possible side effects. Call your doctor for medical advice about side effects. You may report side effects to FDA at 1-800-FDA-1088. °Where should I keep my medication? °Keep out of the reach of children. °Store in a refrigerator between 2 and 8 degrees C (36 and 46 degrees F). Do not freeze or shake. Throw away any unused portion if using a single-dose vial. Multi-dose vials can be kept in the refrigerator for up to 21 days after the initial dose. Throw away unused medicine. °NOTE: This sheet is a summary. It may not cover all possible information. If you have questions about this medicine, talk to your doctor, pharmacist, or health care provider. °© 2022 Elsevier/Gold Standard (2017-04-24 00:00:00) ° °

## 2021-08-23 NOTE — Assessment & Plan Note (Signed)
Hemoglobin electrophoresis: Beta thalassemia minor.This is the cause of microcytosis. It is not iron deficiency related.  Possible differential:Anemia of chronic disease versus myelodysplastic syndrome versus methotrexate related toxicity. His methotrexate has been on hold.However his hemoglobin continues to remain low  Hospitalization 09/22/2019-09/25/2019: A. fib with RVR status post cardioversion, currently on Eliquis  Lab review: 09/25/2019: WBC 7.2, hemoglobin 9.6, platelets 145 10/03/19: WBC: 7.5, Hb 8.9, Pl: 135 03/31/2020:WBC 7.9, Hb 9.2,platelets 125 06/24/2020: Hemoglobin 7.4 (received blood transfusion) 07/26/2020: Hemoglobin 7.9 (bloodtransfusion) 08/09/2020:Hemoglobin 9.2 (Retacritinjection) 09/21/2020: Hemoglobin 8.1 (Retacrit injection) 11/23/20: Hb 6.4 (Retacrit and 11/24/20 Bloodtransfusion) 01/24/21: Hemoglobin7.3 (Retacritand blood) 02/14/2021: Hemoglobin 8.7 (Retacrit) 03/08/2021:Hemoglobin 8.4 (Retacrit) 04/18/2021: Hemoglobin 8.7 (Retacrit), blood transfusion on 04/04/2021 for hemoglobin of 7.7 05/30/21:Hemoglobin 6.7, blood transfusion, platelets 85 (will need to be watched) 07/11/2021: Hemoglobin 7.6 (1 unit of PRBC) 08/03/2021: Hemoglobin 7.3 (Retacrit monthly)  His anemia is due to chronickidneydiseasestage III. I discussed with him that we are trying to decrease the amount of blood transfusion necessary because of risks of iron overload as well as risk of developing antibodies.  Therefore he might need 1 unit alternating with 2 units periodically.

## 2021-08-26 DIAGNOSIS — H353211 Exudative age-related macular degeneration, right eye, with active choroidal neovascularization: Secondary | ICD-10-CM | POA: Diagnosis not present

## 2021-09-12 ENCOUNTER — Inpatient Hospital Stay: Payer: Medicare PPO

## 2021-09-12 ENCOUNTER — Inpatient Hospital Stay: Payer: Medicare PPO | Attending: Hematology and Oncology

## 2021-09-12 ENCOUNTER — Other Ambulatory Visit: Payer: Self-pay | Admitting: *Deleted

## 2021-09-12 ENCOUNTER — Other Ambulatory Visit: Payer: Self-pay

## 2021-09-12 VITALS — BP 116/67 | HR 54 | Temp 98.1°F | Resp 16

## 2021-09-12 DIAGNOSIS — D563 Thalassemia minor: Secondary | ICD-10-CM | POA: Insufficient documentation

## 2021-09-12 DIAGNOSIS — N1832 Chronic kidney disease, stage 3b: Secondary | ICD-10-CM | POA: Diagnosis not present

## 2021-09-12 DIAGNOSIS — I4891 Unspecified atrial fibrillation: Secondary | ICD-10-CM | POA: Diagnosis not present

## 2021-09-12 DIAGNOSIS — D631 Anemia in chronic kidney disease: Secondary | ICD-10-CM | POA: Diagnosis not present

## 2021-09-12 DIAGNOSIS — Z7901 Long term (current) use of anticoagulants: Secondary | ICD-10-CM | POA: Diagnosis not present

## 2021-09-12 DIAGNOSIS — D638 Anemia in other chronic diseases classified elsewhere: Secondary | ICD-10-CM

## 2021-09-12 LAB — SAMPLE TO BLOOD BANK

## 2021-09-12 LAB — CMP (CANCER CENTER ONLY)
ALT: 13 U/L (ref 0–44)
AST: 24 U/L (ref 15–41)
Albumin: 3.6 g/dL (ref 3.5–5.0)
Alkaline Phosphatase: 48 U/L (ref 38–126)
Anion gap: 7 (ref 5–15)
BUN: 28 mg/dL — ABNORMAL HIGH (ref 8–23)
CO2: 29 mmol/L (ref 22–32)
Calcium: 9.1 mg/dL (ref 8.9–10.3)
Chloride: 103 mmol/L (ref 98–111)
Creatinine: 1.84 mg/dL — ABNORMAL HIGH (ref 0.61–1.24)
GFR, Estimated: 36 mL/min — ABNORMAL LOW (ref >60)
Glucose, Bld: 94 mg/dL (ref 70–99)
Potassium: 3.8 mmol/L (ref 3.5–5.1)
Sodium: 139 mmol/L (ref 135–145)
Total Bilirubin: 0.6 mg/dL (ref 0.3–1.2)
Total Protein: 5.8 g/dL — ABNORMAL LOW (ref 6.5–8.1)

## 2021-09-12 LAB — CBC WITH DIFFERENTIAL (CANCER CENTER ONLY)
Abs Immature Granulocytes: 0.04 10*3/uL (ref 0.00–0.07)
Basophils Absolute: 0 10*3/uL (ref 0.0–0.1)
Basophils Relative: 0 %
Eosinophils Absolute: 0.1 10*3/uL (ref 0.0–0.5)
Eosinophils Relative: 2 %
HCT: 29.4 % — ABNORMAL LOW (ref 39.0–52.0)
Hemoglobin: 9.2 g/dL — ABNORMAL LOW (ref 13.0–17.0)
Immature Granulocytes: 1 %
Lymphocytes Relative: 6 %
Lymphs Abs: 0.4 10*3/uL — ABNORMAL LOW (ref 0.7–4.0)
MCH: 22.7 pg — ABNORMAL LOW (ref 26.0–34.0)
MCHC: 31.3 g/dL (ref 30.0–36.0)
MCV: 72.4 fL — ABNORMAL LOW (ref 80.0–100.0)
Monocytes Absolute: 0.3 10*3/uL (ref 0.1–1.0)
Monocytes Relative: 4 %
Neutro Abs: 6.1 10*3/uL (ref 1.7–7.7)
Neutrophils Relative %: 87 %
Platelet Count: 113 10*3/uL — ABNORMAL LOW (ref 150–400)
RBC: 4.06 MIL/uL — ABNORMAL LOW (ref 4.22–5.81)
RDW: 19.7 % — ABNORMAL HIGH (ref 11.5–15.5)
WBC Count: 6.9 10*3/uL (ref 4.0–10.5)
nRBC: 0.3 % — ABNORMAL HIGH (ref 0.0–0.2)

## 2021-09-12 MED ORDER — EPOETIN ALFA-EPBX 40000 UNIT/ML IJ SOLN
40000.0000 [IU] | Freq: Once | INTRAMUSCULAR | Status: AC
  Administered 2021-09-12: 40000 [IU] via SUBCUTANEOUS
  Filled 2021-09-12: qty 1

## 2021-09-12 NOTE — Progress Notes (Signed)
Pt's Hgb 9.2. Does not need blood per parameters. Labs were printed and given to pt. Pt verbalized understanding and had no further questions.

## 2021-09-12 NOTE — Patient Instructions (Signed)
Epoetin Alfa injection °What is this medication? °EPOETIN ALFA (e POE e tin AL fa) helps your body make more red blood cells. This medicine is used to treat anemia caused by chronic kidney disease, cancer chemotherapy, or HIV-therapy. It may also be used before surgery if you have anemia. °This medicine may be used for other purposes; ask your health care provider or pharmacist if you have questions. °COMMON BRAND NAME(S): Epogen, Procrit, Retacrit °What should I tell my care team before I take this medication? °They need to know if you have any of these conditions: °cancer °heart disease °high blood pressure °history of blood clots °history of stroke °low levels of folate, iron, or vitamin B12 in the blood °seizures °an unusual or allergic reaction to erythropoietin, albumin, benzyl alcohol, hamster proteins, other medicines, foods, dyes, or preservatives °pregnant or trying to get pregnant °breast-feeding °How should I use this medication? °This medicine is for injection into a vein or under the skin. It is usually given by a health care professional in a hospital or clinic setting. °If you get this medicine at home, you will be taught how to prepare and give this medicine. Use exactly as directed. Take your medicine at regular intervals. Do not take your medicine more often than directed. °It is important that you put your used needles and syringes in a special sharps container. Do not put them in a trash can. If you do not have a sharps container, call your pharmacist or healthcare provider to get one. °A special MedGuide will be given to you by the pharmacist with each prescription and refill. Be sure to read this information carefully each time. °Talk to your pediatrician regarding the use of this medicine in children. While this drug may be prescribed for selected conditions, precautions do apply. °Overdosage: If you think you have taken too much of this medicine contact a poison control center or emergency  room at once. °NOTE: This medicine is only for you. Do not share this medicine with others. °What if I miss a dose? °If you miss a dose, take it as soon as you can. If it is almost time for your next dose, take only that dose. Do not take double or extra doses. °What may interact with this medication? °Interactions have not been studied. °This list may not describe all possible interactions. Give your health care provider a list of all the medicines, herbs, non-prescription drugs, or dietary supplements you use. Also tell them if you smoke, drink alcohol, or use illegal drugs. Some items may interact with your medicine. °What should I watch for while using this medication? °Your condition will be monitored carefully while you are receiving this medicine. °You may need blood work done while you are taking this medicine. °This medicine may cause a decrease in vitamin B6. You should make sure that you get enough vitamin B6 while you are taking this medicine. Discuss the foods you eat and the vitamins you take with your health care professional. °What side effects may I notice from receiving this medication? °Side effects that you should report to your doctor or health care professional as soon as possible: °allergic reactions like skin rash, itching or hives, swelling of the face, lips, or tongue °seizures °signs and symptoms of a blood clot such as breathing problems; changes in vision; chest pain; severe, sudden headache; pain, swelling, warmth in the leg; trouble speaking; sudden numbness or weakness of the face, arm or leg °signs and symptoms of a stroke like   changes in vision; confusion; trouble speaking or understanding; severe headaches; sudden numbness or weakness of the face, arm or leg; trouble walking; dizziness; loss of balance or coordination °Side effects that usually do not require medical attention (report to your doctor or health care professional if they continue or are  bothersome): °chills °cough °dizziness °fever °headaches °joint pain °muscle cramps °muscle pain °nausea, vomiting °pain, redness, or irritation at site where injected °This list may not describe all possible side effects. Call your doctor for medical advice about side effects. You may report side effects to FDA at 1-800-FDA-1088. °Where should I keep my medication? °Keep out of the reach of children. °Store in a refrigerator between 2 and 8 degrees C (36 and 46 degrees F). Do not freeze or shake. Throw away any unused portion if using a single-dose vial. Multi-dose vials can be kept in the refrigerator for up to 21 days after the initial dose. Throw away unused medicine. °NOTE: This sheet is a summary. It may not cover all possible information. If you have questions about this medicine, talk to your doctor, pharmacist, or health care provider. °© 2022 Elsevier/Gold Standard (2017-04-24 00:00:00) ° °

## 2021-09-18 ENCOUNTER — Other Ambulatory Visit: Payer: Self-pay | Admitting: Internal Medicine

## 2021-09-19 NOTE — Telephone Encounter (Signed)
Prescription refill request for Eliquis received. Indication: Afib  Last office visit:04/14/20  - Pt has scheduled appt on 10/03/21 with Dr Lovena Le  Scr: 1.84 (09/12/21)  Age: 87 Weight: 49.1kg  Appropriate dose and refill sent to requested pharmacy.

## 2021-09-26 DIAGNOSIS — M069 Rheumatoid arthritis, unspecified: Secondary | ICD-10-CM | POA: Diagnosis not present

## 2021-09-26 DIAGNOSIS — K219 Gastro-esophageal reflux disease without esophagitis: Secondary | ICD-10-CM | POA: Diagnosis not present

## 2021-09-26 DIAGNOSIS — E039 Hypothyroidism, unspecified: Secondary | ICD-10-CM | POA: Diagnosis not present

## 2021-09-26 DIAGNOSIS — I480 Paroxysmal atrial fibrillation: Secondary | ICD-10-CM | POA: Diagnosis not present

## 2021-09-26 DIAGNOSIS — I5040 Unspecified combined systolic (congestive) and diastolic (congestive) heart failure: Secondary | ICD-10-CM | POA: Diagnosis not present

## 2021-09-26 DIAGNOSIS — J439 Emphysema, unspecified: Secondary | ICD-10-CM | POA: Diagnosis not present

## 2021-09-26 DIAGNOSIS — F4321 Adjustment disorder with depressed mood: Secondary | ICD-10-CM | POA: Diagnosis not present

## 2021-09-26 DIAGNOSIS — D61818 Other pancytopenia: Secondary | ICD-10-CM | POA: Diagnosis not present

## 2021-09-26 DIAGNOSIS — M19011 Primary osteoarthritis, right shoulder: Secondary | ICD-10-CM | POA: Diagnosis not present

## 2021-10-03 ENCOUNTER — Ambulatory Visit: Payer: Medicare PPO | Admitting: Internal Medicine

## 2021-10-03 ENCOUNTER — Other Ambulatory Visit: Payer: Self-pay

## 2021-10-03 ENCOUNTER — Other Ambulatory Visit: Payer: Medicare PPO

## 2021-10-03 ENCOUNTER — Encounter: Payer: Self-pay | Admitting: Internal Medicine

## 2021-10-03 ENCOUNTER — Ambulatory Visit: Payer: Medicare PPO | Admitting: Hematology and Oncology

## 2021-10-03 VITALS — BP 126/68 | HR 63 | Ht 60.0 in | Wt 109.0 lb

## 2021-10-03 DIAGNOSIS — I480 Paroxysmal atrial fibrillation: Secondary | ICD-10-CM | POA: Diagnosis not present

## 2021-10-03 DIAGNOSIS — I484 Atypical atrial flutter: Secondary | ICD-10-CM

## 2021-10-03 MED ORDER — AMIODARONE HCL 200 MG PO TABS: ORAL_TABLET | ORAL | 3 refills | Status: DC

## 2021-10-03 NOTE — Progress Notes (Signed)
Patient Care Team: Lavone Orn, MD as PCP - General (Internal Medicine) Sueanne Margarita, MD as PCP - Cardiology (Cardiology) Sueanne Margarita, MD as Consulting Physician (Cardiology)  DIAGNOSIS:    ICD-10-CM   1. Anemia of chronic disease  D63.8       CHIEF COMPLIANT: Follow-up of anemia, thrombocytopenia  INTERVAL HISTORY: Joshua Hudson is a 87 y.o. with above-mentioned history of anemia and thrombocytopenia currently on treatment with Aranesp. Labs on 09/12/2021 showed Hgb 9.2, HCT 29.4, PLT 113. He presents to the clinic today for follow-up and to review his labs.  He reports that he has been feeling extremely tired and dizzy and lightheaded and short of breath to minimal exertion.  ALLERGIES:  has No Known Allergies.  MEDICATIONS:  Current Outpatient Medications  Medication Sig Dispense Refill   amiodarone (PACERONE) 200 MG tablet 200 mg daily EXCEPT on Sunday 90 tablet 3   ELIQUIS 2.5 MG TABS tablet TAKE 1 TABLET(2.5 MG) BY MOUTH TWICE DAILY 180 tablet 1   finasteride (PROSCAR) 5 MG tablet Take 5 mg by mouth daily as needed (retention).      folic acid (FOLVITE) 1 MG tablet Take 1 mg by mouth daily.     furosemide (LASIX) 40 MG tablet Take 1 tablet (40 mg total) by mouth daily as needed. Pt needs to make appt with provider for further refills - 2nd attempt 15 tablet 0   leflunomide (ARAVA) 20 MG tablet Take 20 mg by mouth daily.     levothyroxine (SYNTHROID) 75 MCG tablet 1 tablet in the morning on an empty stomach     Multiple Vitamin (MULTIVITAMIN WITH MINERALS) TABS tablet Take 1 tablet by mouth daily.     pantoprazole (PROTONIX) 40 MG tablet Take 40 mg by mouth daily before breakfast.      Polyethyl Glycol-Propyl Glycol (LUBRICANT EYE DROPS) 0.4-0.3 % SOLN Place 1 drop into both eyes 3 (three) times daily as needed (dry/irritated eyes.).     potassium chloride SA (KLOR-CON) 20 MEQ tablet Take 1 tablet (20 mEq total) by mouth daily. NEED OV. 90 tablet 0   predniSONE  (DELTASONE) 5 MG tablet Take 5 mg by mouth daily with breakfast.      torsemide (DEMADEX) 20 MG tablet 1 tablet     traMADol (ULTRAM) 50 MG tablet Take 50 mg by mouth every 6 (six) hours as needed.     No current facility-administered medications for this visit.    PHYSICAL EXAMINATION: ECOG PERFORMANCE STATUS: 1 - Symptomatic but completely ambulatory  Vitals:   10/04/21 1028 10/04/21 1029  BP: (!) 104/44 (!) 120/50  Pulse: (!) 59   Resp: 17   Temp: (!) 97.5 F (36.4 C)   SpO2: 100%    Filed Weights   10/04/21 1028  Weight: 108 lb 12.8 oz (49.4 kg)    LABORATORY DATA:  I have reviewed the data as listed CMP Latest Ref Rng & Units 10/04/2021 09/12/2021 08/03/2021  Glucose 70 - 99 mg/dL 98 94 77  BUN 8 - 23 mg/dL 33(H) 28(H) 35(H)  Creatinine 0.61 - 1.24 mg/dL 1.98(H) 1.84(H) 1.79(H)  Sodium 135 - 145 mmol/L 141 139 139  Potassium 3.5 - 5.1 mmol/L 4.1 3.8 3.5  Chloride 98 - 111 mmol/L 104 103 103  CO2 22 - 32 mmol/L 30 29 27   Calcium 8.9 - 10.3 mg/dL 8.5(L) 9.1 8.5(L)  Total Protein 6.5 - 8.1 g/dL 5.9(L) 5.8(L) 5.7(L)  Total Bilirubin 0.3 - 1.2 mg/dL 0.5 0.6 0.6  Alkaline Phos 38 - 126 U/L 45 48 45  AST 15 - 41 U/L 21 24 23   ALT 0 - 44 U/L 11 13 13     Lab Results  Component Value Date   WBC 7.3 10/04/2021   HGB 8.0 (L) 10/04/2021   HCT 24.8 (L) 10/04/2021   MCV 71.1 (L) 10/04/2021   PLT 151 10/04/2021   NEUTROABS 6.0 10/04/2021    ASSESSMENT & PLAN:  Anemia of chronic disease Hemoglobin electrophoresis: Beta thalassemia minor.  This is the cause of microcytosis.  It is not iron deficiency related.   Possible differential: Anemia of chronic disease versus myelodysplastic syndrome versus methotrexate related toxicity. His methotrexate has been on hold.  However his hemoglobin continues to remain low   Hospitalization 09/22/2019-09/25/2019: A. fib with RVR status post cardioversion, currently on Eliquis   Lab review: 09/25/2019: WBC 7.2, hemoglobin 9.6, platelets  145 10/03/19: WBC: 7.5, Hb 8.9, Pl: 135 04/18/2021: Hemoglobin 8.7 (Retacrit), blood transfusion on 04/04/2021 for hemoglobin of 7.7  05/30/21: Hemoglobin 6.7, blood transfusion, platelets 85 (will need to be watched) 08/23/2021: Hemoglobin 9.6 (Retacrit) 10/04/2021: Hemoglobin 8, platelets 151 (2 units of PRBC)   His anemia is due to chronic kidney disease stage III.   He will get 2 units of PRBC today.  Return to clinic in 3 weeks for labs and blood transfusion if needed. I will see him back in 6 weeks.   No orders of the defined types were placed in this encounter.  The patient has a good understanding of the overall plan. he agrees with it. he will call with any problems that may develop before the next visit here.  Total time spent: 30 mins including face to face time and time spent for planning, charting and coordination of care  [name], MD, MPH 10/04/2021  I, Thana Ates, am acting as scribe for Dr. Nicholas Lose.  I have reviewed the above documentation for accuracy and completeness, and I agree with the above.

## 2021-10-03 NOTE — Patient Instructions (Addendum)
Medication Instructions:  Reduce Amiodarone to 200 mg daily Except on Sunday Your physician recommends that you continue on your current medications as directed. Please refer to the Current Medication list given to you today. *If you need a refill on your cardiac medications before your next appointment, please call your pharmacy*  Lab Work: None. If you have labs (blood work) drawn today and your tests are completely normal, you will receive your results only by: [place] (if you have MyChart) OR A paper copy in the mail If you have any lab test that is abnormal or we need to change your treatment, we will call you to review the results.  Testing/Procedures: None.  Follow-Up: At [facility], you and your health needs are our priority.  As part of our continuing mission to provide you with exceptional heart care, we have created designated Provider Care Teams.  These Care Teams include your primary Cardiologist (physician) and Advanced Practice Providers (APPs -  Physician Assistants and Nurse Practitioners) who all work together to provide you with the care you need, when you need it.  Your physician wants you to follow-up in: 12 months with [name], MD     You will receive a reminder letter in the mail two months in advance. If you don't receive a letter, please call our office to schedule the follow-up appointment.  We recommend signing up for the patient portal called "MyChart".  Sign up information is provided on this After Visit Summary.  MyChart is used to connect with patients for Virtual Visits (Telemedicine).  Patients are able to view lab/test results, encounter notes, upcoming appointments, etc.  Non-urgent messages can be sent to your provider as well.   To learn more about what you can do with MyChart, go to [email or web address].    Any Other Special Instructions Will Be Listed Below (If Applicable).

## 2021-10-03 NOTE — Progress Notes (Signed)
HPI Joshua Hudson returns today for followup. He is a pleasant 87 yo man with a h/o HTN and atrial flutter who underwent EPS/RFA of typical atrial flutter but then developed LA flutter. He has been placed on amiodarone. He feel ok at rest but gets fatigued and sob with exertion. He does not have palpitations and has not missed any of his meds.  No Known Allergies   Current Outpatient Medications  Medication Sig Dispense Refill   amiodarone (PACERONE) 200 MG tablet Take 1 tablet (200 mg total) by mouth daily. Please keep upcoming appt in January 2023 with Dr. Lovena Le before anymore refills. Thank you 90 tablet 0   ELIQUIS 2.5 MG TABS tablet TAKE 1 TABLET(2.5 MG) BY MOUTH TWICE DAILY 180 tablet 1   finasteride (PROSCAR) 5 MG tablet Take 5 mg by mouth daily as needed (retention).      folic acid (FOLVITE) 1 MG tablet Take 1 mg by mouth daily.     furosemide (LASIX) 40 MG tablet Take 1 tablet (40 mg total) by mouth daily as needed. Pt needs to make appt with provider for further refills - 2nd attempt 15 tablet 0   leflunomide (ARAVA) 20 MG tablet Take 20 mg by mouth daily.     levothyroxine (SYNTHROID) 75 MCG tablet 1 tablet in the morning on an empty stomach     Multiple Vitamin (MULTIVITAMIN WITH MINERALS) TABS tablet Take 1 tablet by mouth daily.     pantoprazole (PROTONIX) 40 MG tablet Take 40 mg by mouth daily before breakfast.      Polyethyl Glycol-Propyl Glycol (LUBRICANT EYE DROPS) 0.4-0.3 % SOLN Place 1 drop into both eyes 3 (three) times daily as needed (dry/irritated eyes.).     potassium chloride SA (KLOR-CON) 20 MEQ tablet Take 1 tablet (20 mEq total) by mouth daily. NEED OV. 90 tablet 0   predniSONE (DELTASONE) 5 MG tablet Take 5 mg by mouth daily with breakfast.      torsemide (DEMADEX) 20 MG tablet 1 tablet     traMADol (ULTRAM) 50 MG tablet Take 50 mg by mouth every 6 (six) hours as needed.     No current facility-administered medications for this visit.     Past Medical  History:  Diagnosis Date   Achalasia 06/12/2019   Noted on EGD   Aortic insufficiency 03/24/2019   AI of bioprosthetic AVR severe 4 plus   Chronic diastolic CHF (congestive heart failure) ([place]) 03/24/2019   Colon polyps    s/p diverticular perforation requiring 2-stage repair   Derangement of right shoulder joint    need replacing has no use of   Diverticulosis    DJD (degenerative joint disease), lumbosacral    Dysphagia    eats soft food   Esophageal stricture    GERD (gastroesophageal reflux disease)    Hemolytic anemia ([place])    History of blood transfusion given with 04-15-19 surgery   History of blood transfusion 07/18/2019   History of kidney stones    passed stones   Hypertension    Mitral regurgitation    moderate   Occipital neuralgia    Pancytopenia (HCC)    Postoperative atrial fibrillation ([place]) 05/10/2015   Prosthetic valve dysfunction    Rheumatoid arthritis (HCC)    s/o long term steroids  shoulders and hands   Rotator cuff arthropathy    right   S/P aortic valve replacement with bioprosthetic valve 01/16/2008   34mm Edwards Magna perimount bovine pericardial tissue valve, model  3000   S/P valve-in-valve TAVR 04/15/2019   26 mm Edwards Sapien 3 Ultra transcatheter heart valve placed via percutaneous right transfemoral approach    SBE (subacute bacterial endocarditis) prophylaxis candidate    for dental procedures   Schatzki's ring 06/12/2019   Narrowing, Noted on EGD   Severe aortic stenosis    S/P prosthetic valve replacement w 25 mm Edwards like science percardial tissue valve,[place] - 01/2008   Thrombocytopenia (HCC)     ROS:   All systems reviewed and negative except as noted in the HPI.   Past Surgical History:  Procedure Laterality Date   A-FLUTTER ABLATION N/A 10/10/2019   Procedure: A-FLUTTER ABLATION;  Surgeon: Evans Lance, MD;  Location: [place] [facility];  Service: Cardiovascular;  Laterality: N/A;   APPENDECTOMY     BALLOON DILATION  N/A 06/12/2019   Procedure: BALLOON DILATION;  Surgeon: Ronnette Juniper, MD;  Location: [facility];  Service: Gastroenterology;  Laterality: N/A;   BOTOX INJECTION N/A 01/22/2018   Procedure: BOTOX INJECTION;  Surgeon: Ronnette Juniper, MD;  Location: [facility];  Service: Gastroenterology;  Laterality: N/A;   CARDIAC CATHETERIZATION     09   CARDIAC VALVE REPLACEMENT  01/2008   aortic valve replacement   CARDIOVERSION N/A 09/25/2019   Procedure: CARDIOVERSION;  Surgeon: Jerline Pain, MD;  Location: [place];  Service: Cardiovascular;  Laterality: N/A;   CARDIOVERSION N/A 12/25/2019   Procedure: CARDIOVERSION;  Surgeon: Lelon Perla, MD;  Location[facility];  Service: Cardiovascular;  Laterality: N/A;   CATARACT EXTRACTION W/ INTRAOCULAR LENS  IMPLANT, BILATERAL     COLON RESECTION     diverticulitis    CORONARY ANGIOPLASTY     dental implants     permanent   ESOPHAGEAL MANOMETRY N/A 11/07/2017   Procedure: ESOPHAGEAL MANOMETRY (EM);  Surgeon: Ronnette Juniper, MD;  [facility];  Service: Gastroenterology;  Laterality: N/A;   ESOPHAGOGASTRODUODENOSCOPY (EGD) WITH PROPOFOL N/A 01/22/2018   Procedure: ESOPHAGOGASTRODUODENOSCOPY (EGD) WITH PROPOFOL;  Surgeon: Ronnette Juniper, MD;  [facility];  Service: Gastroenterology;  Laterality: N/A;   ESOPHAGOGASTRODUODENOSCOPY (EGD) WITH PROPOFOL N/A 04/30/2019   Procedure: ESOPHAGOGASTRODUODENOSCOPY (EGD) WITH PROPOFOL;  Surgeon: Laurence Spates, MD;  Location: [place];  Service: Endoscopy;  Laterality: N/A;   ESOPHAGOGASTRODUODENOSCOPY (EGD) WITH PROPOFOL N/A 06/12/2019   Procedure: ESOPHAGOGASTRODUODENOSCOPY (EGD) WITH PROPOFOL;  Surgeon: Ronnette Juniper, MD;  [facility];  Service: Gastroenterology;  Laterality: N/A;  with botox injection   EXCISIONAL TOTAL SHOULDER ARTHROPLASTY WITH ANTIBIOTIC SPACER Right 12/31/2018   Procedure: EXCISIONAL TOTAL SHOULDER ARTHROPLASTY WITH ANTIBIOTIC SPACER;  Surgeon: Netta Cedars, MD;   Location: [place];  Service: Orthopedics;  Laterality: Right;   HERNIA REPAIR     INTRAOPERATIVE TRANSTHORACIC ECHOCARDIOGRAM N/A 04/15/2019   Procedure: Intraoperative Transthoracic Echocardiogram;  Surgeon: Sherren Mocha, MD;  Location: [place];  Service: Open Heart Surgery;  Laterality: N/A;   IRRIGATION AND DEBRIDEMENT SHOULDER Right 11/20/2018    IRRIGATION AND DEBRIDEMENT SHOULDER WITH POLY EXCHANGE (Right Shoulder)   IRRIGATION AND DEBRIDEMENT SHOULDER Right 11/20/2018   Procedure: IRRIGATION AND DEBRIDEMENT SHOULDER WITH POLY EXCHANGE;  Surgeon: Netta Cedars, MD;  Location: [place];  Service: Orthopedics;  Laterality: Right;   LUMBAR LAMINECTOMY     x 2   REVERSE SHOULDER ARTHROPLASTY Right 03/01/2018   Procedure: RIGHT REVERSE SHOULDER ARTHROPLASTY;  Surgeon: Netta Cedars, MD;  Location: [place];  Service: Orthopedics;  Laterality: Right;   REVERSE SHOULDER ARTHROPLASTY Right 07/18/2019   Procedure: REVERSE TOTAL SHOULDER ARTHROPLASTY and  removal of antiobotic spacer;  Surgeon: Netta Cedars, MD;  Location: [facility];  Service: Orthopedics;  Laterality: Right;  interscalene block   REVERSE SHOULDER ARTHROPLASTY Right 08/06/2019   Procedure: Reduction of dislocated reverse total shoulder and poly exchange;  Surgeon: Netta Cedars, MD;  Location: [facility];  Service: Orthopedics;  Laterality: Right;  need 1 hour   RIGHT/LEFT HEART CATH AND CORONARY ANGIOGRAPHY N/A 04/02/2019   Procedure: RIGHT/LEFT HEART CATH AND CORONARY ANGIOGRAPHY;  Surgeon: Leonie Man, MD;  Location: [place] [facility];  Service: Cardiovascular;  Laterality: N/A;   SAVORY DILATION N/A 01/22/2018   Procedure: SAVORY DILATION;  Surgeon: Ronnette Juniper, MD;  [facility];  Service: Gastroenterology;  Laterality: N/A;   SAVORY DILATION N/A 04/30/2019   Procedure: SAVORY DILATION;  Surgeon: Laurence Spates, MD;  Location: [place];  Service: Endoscopy;  Laterality: N/A;  With fluro   SHOULDER HEMI-ARTHROPLASTY Right  06/14/2018   Procedure: RIGHT  REVERSE TOTAL SHOULDER OPEN POLY EXCHANGE;  Surgeon: Netta Cedars, MD;  Location: [place];  Service: Orthopedics;  Laterality: Right;   SUBMUCOSAL INJECTION  06/12/2019   Procedure: SUBMUCOSAL INJECTION;  Surgeon: Ronnette Juniper, MD;  [facility];  Service: Gastroenterology;;   TEE WITHOUT CARDIOVERSION N/A 01/29/2014   Procedure: TRANSESOPHAGEAL ECHOCARDIOGRAM (TEE);  Surgeon: Sueanne Margarita, MD;  Location: [facility] END[facility]COPY;  Service: Cardiovascular;  Laterality: N/A;   TEE WITHOUT CARDIOVERSION N/A 04/02/2019   Procedure: TRANSESOPHAGEAL ECHOCARDIOGRAM (TEE);  Surgeon: Josue Hector, MD;  Location: [facility]COPY;  Service: Cardiovascular;  Laterality: N/A;   TEE WITHOUT CARDIOVERSION N/A 09/25/2019   Procedure: TRANSESOPHAGEAL ECHOCARDIOGRAM (TEE);  Surgeon: Jerline Pain, MD;  Location: [facility]COPY;  Service: Cardiovascular;  Laterality: N/A;   TONSILLECTOMY     TRANSCATHETER AORTIC VALVE REPLACEMENT, TRANSFEMORAL  04/15/2019   TRANSCATHETER AORTIC VALVE REPLACEMENT, TRANSFEMORAL N/A 04/15/2019   Procedure: TRANSCATHETER AORTIC VALVE REPLACEMENT, TRANSFEMORAL with POST BALLOON DILATION;  Surgeon: Sherren Mocha, MD;  Location: [place];  Service: Open Heart Surgery;  Laterality: N/A;     Family History  Problem Relation Age of Onset   Other Mother        NO HEALTH PROBLEMS   Heart disease Father    Arthritis Father    Heart Problems Sister        RELATED TO A MVA   Suicidality Brother    Other Sister        3 SISTERS IN GOOD HEALTH   Other Brother        2 BROTHERS IN GOOD HEALTH   Other Daughter        2 IN GOOD HEALTH     Social History   Socioeconomic History   Marital status: Widowed    Spouse name: Not on file   Number of children: 2   Years of education: Not on file   Highest education level: Not on file  Occupational History   Occupation: Retired Market researcher at [employer]   Smoking status: Former    Packs/day: 0.50    Years:  10.00    Pack years: 5.00    Types: Cigarettes    Start date: 01/16/1974    Quit date: 09/05/1983    Years since quitting: 38.1   Smokeless tobacco: Former    Types: Nurse, children's Use: Never used  Substance and Sexual Activity   Alcohol use: Not Currently    Alcohol/week: 0.0 standard drinks   Drug use: No  Sexual activity: Not on file  Other Topics Concern   Not on file  Social History Narrative   Not on file   Social Determinants of Health   Financial Resource Strain: Not on file  Food Insecurity: Not on file  Transportation Needs: Not on file  Physical Activity: Not on file  Stress: Not on file  Social Connections: Not on file  Intimate Partner Violence: Not on file     BP 126/68    Pulse 63    Ht 5' (1.524 m)    Wt 109 lb (49.4 kg)    SpO2 (!) 88%    BMI 21.29 kg/m   Physical Exam:  Well appearing NAD HEENT: Unremarkable Neck:  No JVD, no thyromegally Lymphatics:  No adenopathy Back:  No CVA tenderness Lungs:  Clear with no wheezes but reduced breath sounds. HEART:  Regular rate rhythm, no murmurs, no rubs, no clicks Abd:  soft, positive bowel sounds, no organomegally, no rebound, no guarding Ext:  2 plus pulses, 2+ edema, no cyanosis, no clubbing Skin:  No rashes no nodules Neuro:  CN II through XII intact, motor grossly intact  EKG - nsr  Assess/Plan:  LA flutter/fib - he is s/p DCCV and has maintained NSR on amiodarone. I asked him to reduce his does to 200 mg daily, none on Sunday. I will see him back in a year. Tobacco abuse - he smells of cigarettes but denies smoking.  Peripheral edema - he is encouraged to reduce his salt intake. He admits to eating out at restaurants.  AS - he is s/p TAVR and has no murmur on exam.   Salome Spotted

## 2021-10-04 ENCOUNTER — Inpatient Hospital Stay: Payer: Medicare PPO

## 2021-10-04 ENCOUNTER — Inpatient Hospital Stay: Payer: Medicare PPO | Admitting: Hematology and Oncology

## 2021-10-04 ENCOUNTER — Other Ambulatory Visit: Payer: Self-pay | Admitting: *Deleted

## 2021-10-04 DIAGNOSIS — D561 Beta thalassemia: Secondary | ICD-10-CM

## 2021-10-04 DIAGNOSIS — Z7901 Long term (current) use of anticoagulants: Secondary | ICD-10-CM | POA: Diagnosis not present

## 2021-10-04 DIAGNOSIS — D563 Thalassemia minor: Secondary | ICD-10-CM | POA: Diagnosis not present

## 2021-10-04 DIAGNOSIS — D638 Anemia in other chronic diseases classified elsewhere: Secondary | ICD-10-CM | POA: Diagnosis not present

## 2021-10-04 DIAGNOSIS — D631 Anemia in chronic kidney disease: Secondary | ICD-10-CM | POA: Diagnosis not present

## 2021-10-04 DIAGNOSIS — N1832 Chronic kidney disease, stage 3b: Secondary | ICD-10-CM | POA: Diagnosis not present

## 2021-10-04 DIAGNOSIS — I4891 Unspecified atrial fibrillation: Secondary | ICD-10-CM | POA: Diagnosis not present

## 2021-10-04 LAB — CMP (CANCER CENTER ONLY)
ALT: 11 U/L (ref 0–44)
AST: 21 U/L (ref 15–41)
Albumin: 3.7 g/dL (ref 3.5–5.0)
Alkaline Phosphatase: 45 U/L (ref 38–126)
Anion gap: 7 (ref 5–15)
BUN: 33 mg/dL — ABNORMAL HIGH (ref 8–23)
CO2: 30 mmol/L (ref 22–32)
Calcium: 8.5 mg/dL — ABNORMAL LOW (ref 8.9–10.3)
Chloride: 104 mmol/L (ref 98–111)
Creatinine: 1.98 mg/dL — ABNORMAL HIGH (ref 0.61–1.24)
GFR, Estimated: 32 mL/min — ABNORMAL LOW (ref >60)
Glucose, Bld: 98 mg/dL (ref 70–99)
Potassium: 4.1 mmol/L (ref 3.5–5.1)
Sodium: 141 mmol/L (ref 135–145)
Total Bilirubin: 0.5 mg/dL (ref 0.3–1.2)
Total Protein: 5.9 g/dL — ABNORMAL LOW (ref 6.5–8.1)

## 2021-10-04 LAB — SAMPLE TO BLOOD BANK

## 2021-10-04 LAB — CBC WITH DIFFERENTIAL (CANCER CENTER ONLY)
Abs Immature Granulocytes: 0.03 10*3/uL (ref 0.00–0.07)
Basophils Absolute: 0 10*3/uL (ref 0.0–0.1)
Basophils Relative: 1 %
Eosinophils Absolute: 0.2 10*3/uL (ref 0.0–0.5)
Eosinophils Relative: 3 %
HCT: 24.8 % — ABNORMAL LOW (ref 39.0–52.0)
Hemoglobin: 8 g/dL — ABNORMAL LOW (ref 13.0–17.0)
Immature Granulocytes: 0 %
Lymphocytes Relative: 9 %
Lymphs Abs: 0.6 10*3/uL — ABNORMAL LOW (ref 0.7–4.0)
MCH: 22.9 pg — ABNORMAL LOW (ref 26.0–34.0)
MCHC: 32.3 g/dL (ref 30.0–36.0)
MCV: 71.1 fL — ABNORMAL LOW (ref 80.0–100.0)
Monocytes Absolute: 0.4 10*3/uL (ref 0.1–1.0)
Monocytes Relative: 6 %
Neutro Abs: 6 10*3/uL (ref 1.7–7.7)
Neutrophils Relative %: 81 %
Platelet Count: 151 10*3/uL (ref 150–400)
RBC: 3.49 MIL/uL — ABNORMAL LOW (ref 4.22–5.81)
RDW: 19.2 % — ABNORMAL HIGH (ref 11.5–15.5)
WBC Count: 7.3 10*3/uL (ref 4.0–10.5)
nRBC: 0.5 % — ABNORMAL HIGH (ref 0.0–0.2)

## 2021-10-04 LAB — PREPARE RBC (CROSSMATCH)

## 2021-10-04 MED ORDER — DIPHENHYDRAMINE HCL 25 MG PO CAPS
25.0000 mg | ORAL_CAPSULE | Freq: Once | ORAL | Status: AC
  Administered 2021-10-04: 25 mg via ORAL
  Filled 2021-10-04: qty 1

## 2021-10-04 MED ORDER — SODIUM CHLORIDE 0.9% IV SOLUTION
250.0000 mL | Freq: Once | INTRAVENOUS | Status: AC
  Administered 2021-10-04: 250 mL via INTRAVENOUS

## 2021-10-04 MED ORDER — ACETAMINOPHEN 325 MG PO TABS
650.0000 mg | ORAL_TABLET | Freq: Once | ORAL | Status: AC
  Administered 2021-10-04: 650 mg via ORAL
  Filled 2021-10-04: qty 2

## 2021-10-04 NOTE — Progress Notes (Signed)
Pt hgb 8.0  per MD pt to receive 2 units PRBC's today.  Orders placed.

## 2021-10-04 NOTE — Assessment & Plan Note (Signed)
Hemoglobin electrophoresis: Beta thalassemia minor.This is the cause of microcytosis. It is not iron deficiency related.  Possible differential:Anemia of chronic disease versus myelodysplastic syndrome versus methotrexate related toxicity. His methotrexate has been on hold.However his hemoglobin continues to remain low  Hospitalization 09/22/2019-09/25/2019: A. fib with RVR status post cardioversion, currently on Eliquis  Lab review: 09/25/2019: WBC 7.2, hemoglobin 9.6, platelets 145 10/03/19: WBC: 7.5, Hb 8.9, Pl: 135 03/31/2020:WBC 7.9, Hb 9.2,platelets 125 06/24/2020: Hemoglobin 7.4 (received blood transfusion) 07/26/2020: Hemoglobin 7.9 (bloodtransfusion) 08/09/2020:Hemoglobin 9.2 (Retacritinjection) 09/21/2020: Hemoglobin 8.1 (Retacrit injection) 11/23/20: Hb 6.4 (Retacrit and 11/24/20 Bloodtransfusion) 01/24/21: Hemoglobin7.3 (Retacritand blood) 02/14/2021: Hemoglobin 8.7 (Retacrit) 03/08/2021:Hemoglobin 8.4 (Retacrit) 04/18/2021: Hemoglobin 8.7 (Retacrit), blood transfusion on 04/04/2021 for hemoglobin of 7.7 05/30/21:Hemoglobin 6.7, blood transfusion, platelets 85 (will need to be watched) 07/11/2021:Hemoglobin 7.6 (1 unit of PRBC) 08/03/2021: Hemoglobin 7.3 (Retacrit monthly) 08/23/2021: Hemoglobin 9.6 (Retacrit)  His anemia is due to chronickidneydiseasestage III.  Return to clinic in 3 weeks for labs and blood transfusion if needed.

## 2021-10-05 ENCOUNTER — Telehealth: Payer: Self-pay | Admitting: Hematology and Oncology

## 2021-10-05 LAB — TYPE AND SCREEN
ABO/RH(D): A POS
Antibody Screen: NEGATIVE
Unit division: 0
Unit division: 0

## 2021-10-05 LAB — BPAM RBC
Blood Product Expiration Date: 202302152359
Blood Product Expiration Date: 202302152359
ISSUE DATE / TIME: 202301311201
ISSUE DATE / TIME: 202301311201
Unit Type and Rh: 6200
Unit Type and Rh: 6200

## 2021-10-05 NOTE — Telephone Encounter (Signed)
Scheduled appointment per 01/31 los. Patient aware.

## 2021-10-06 DIAGNOSIS — H353211 Exudative age-related macular degeneration, right eye, with active choroidal neovascularization: Secondary | ICD-10-CM | POA: Diagnosis not present

## 2021-10-12 ENCOUNTER — Other Ambulatory Visit: Payer: Self-pay | Admitting: Rheumatology

## 2021-10-12 DIAGNOSIS — M810 Age-related osteoporosis without current pathological fracture: Secondary | ICD-10-CM | POA: Diagnosis not present

## 2021-10-12 DIAGNOSIS — Z79899 Other long term (current) drug therapy: Secondary | ICD-10-CM | POA: Diagnosis not present

## 2021-10-12 DIAGNOSIS — D638 Anemia in other chronic diseases classified elsewhere: Secondary | ICD-10-CM | POA: Diagnosis not present

## 2021-10-12 DIAGNOSIS — M0579 Rheumatoid arthritis with rheumatoid factor of multiple sites without organ or systems involvement: Secondary | ICD-10-CM | POA: Diagnosis not present

## 2021-10-12 DIAGNOSIS — Z681 Body mass index (BMI) 19 or less, adult: Secondary | ICD-10-CM | POA: Diagnosis not present

## 2021-10-12 DIAGNOSIS — M2550 Pain in unspecified joint: Secondary | ICD-10-CM | POA: Diagnosis not present

## 2021-10-24 ENCOUNTER — Inpatient Hospital Stay: Payer: Medicare PPO

## 2021-10-24 ENCOUNTER — Other Ambulatory Visit: Payer: Self-pay

## 2021-10-24 ENCOUNTER — Inpatient Hospital Stay: Payer: Medicare PPO | Attending: Hematology and Oncology

## 2021-10-24 VITALS — BP 109/61 | HR 58 | Resp 18

## 2021-10-24 DIAGNOSIS — N1832 Chronic kidney disease, stage 3b: Secondary | ICD-10-CM | POA: Insufficient documentation

## 2021-10-24 DIAGNOSIS — D563 Thalassemia minor: Secondary | ICD-10-CM | POA: Diagnosis not present

## 2021-10-24 DIAGNOSIS — D631 Anemia in chronic kidney disease: Secondary | ICD-10-CM | POA: Diagnosis not present

## 2021-10-24 DIAGNOSIS — D638 Anemia in other chronic diseases classified elsewhere: Secondary | ICD-10-CM

## 2021-10-24 LAB — CMP (CANCER CENTER ONLY)
ALT: 11 U/L (ref 0–44)
AST: 22 U/L (ref 15–41)
Albumin: 3.6 g/dL (ref 3.5–5.0)
Alkaline Phosphatase: 44 U/L (ref 38–126)
Anion gap: 8 (ref 5–15)
BUN: 43 mg/dL — ABNORMAL HIGH (ref 8–23)
CO2: 31 mmol/L (ref 22–32)
Calcium: 8.9 mg/dL (ref 8.9–10.3)
Chloride: 104 mmol/L (ref 98–111)
Creatinine: 2.07 mg/dL — ABNORMAL HIGH (ref 0.61–1.24)
GFR, Estimated: 31 mL/min — ABNORMAL LOW (ref >60)
Glucose, Bld: 80 mg/dL (ref 70–99)
Potassium: 3.1 mmol/L — ABNORMAL LOW (ref 3.5–5.1)
Sodium: 143 mmol/L (ref 135–145)
Total Bilirubin: 0.5 mg/dL (ref 0.3–1.2)
Total Protein: 5.7 g/dL — ABNORMAL LOW (ref 6.5–8.1)

## 2021-10-24 LAB — CBC WITH DIFFERENTIAL (CANCER CENTER ONLY)
Abs Immature Granulocytes: 0.03 10*3/uL (ref 0.00–0.07)
Basophils Absolute: 0 10*3/uL (ref 0.0–0.1)
Basophils Relative: 1 %
Eosinophils Absolute: 0.5 10*3/uL (ref 0.0–0.5)
Eosinophils Relative: 6 %
HCT: 26.5 % — ABNORMAL LOW (ref 39.0–52.0)
Hemoglobin: 8.6 g/dL — ABNORMAL LOW (ref 13.0–17.0)
Immature Granulocytes: 0 %
Lymphocytes Relative: 14 %
Lymphs Abs: 1.1 10*3/uL (ref 0.7–4.0)
MCH: 23.3 pg — ABNORMAL LOW (ref 26.0–34.0)
MCHC: 32.5 g/dL (ref 30.0–36.0)
MCV: 71.8 fL — ABNORMAL LOW (ref 80.0–100.0)
Monocytes Absolute: 0.8 10*3/uL (ref 0.1–1.0)
Monocytes Relative: 11 %
Neutro Abs: 5.5 10*3/uL (ref 1.7–7.7)
Neutrophils Relative %: 68 %
Platelet Count: 128 10*3/uL — ABNORMAL LOW (ref 150–400)
RBC: 3.69 MIL/uL — ABNORMAL LOW (ref 4.22–5.81)
RDW: 20.3 % — ABNORMAL HIGH (ref 11.5–15.5)
WBC Count: 7.9 10*3/uL (ref 4.0–10.5)
nRBC: 0.4 % — ABNORMAL HIGH (ref 0.0–0.2)

## 2021-10-24 LAB — SAMPLE TO BLOOD BANK

## 2021-10-24 MED ORDER — EPOETIN ALFA-EPBX 40000 UNIT/ML IJ SOLN
40000.0000 [IU] | Freq: Once | INTRAMUSCULAR | Status: AC
  Administered 2021-10-24: 40000 [IU] via SUBCUTANEOUS

## 2021-10-24 NOTE — Progress Notes (Signed)
Per Dr. Lindi Adie patient only getting epoetin injection today. No blood needed at this time.

## 2021-11-10 DIAGNOSIS — H353211 Exudative age-related macular degeneration, right eye, with active choroidal neovascularization: Secondary | ICD-10-CM | POA: Diagnosis not present

## 2021-11-10 NOTE — Progress Notes (Signed)
? ?Patient Care Team: ?Lavone Orn, MD as PCP - General (Internal Medicine) ?Sueanne Margarita, MD as PCP - Cardiology (Cardiology) ?Sueanne Margarita, MD as Consulting Physician (Cardiology) ? ?DIAGNOSIS:  ?Encounter Diagnosis  ?Name Primary?  ? Anemia of chronic disease   ? ?  ?CHIEF COMPLIANT:  Follow-up of anemia, thrombocytopenia ?  ? ?INTERVAL HISTORY: Joshua Hudson is a 87 y.o. with above-mentioned history of anemia and thrombocytopenia currently on treatment with Aranesp. he presents to the clinic today for follow-up. He C/O SOB to exertion  ? ? ?ALLERGIES:  has No Known Allergies. ? ?MEDICATIONS:  ?Current Outpatient Medications  ?Medication Sig Dispense Refill  ? amiodarone (PACERONE) 200 MG tablet 200 mg daily EXCEPT on Sunday 90 tablet 3  ? ELIQUIS 2.5 MG TABS tablet TAKE 1 TABLET(2.5 MG) BY MOUTH TWICE DAILY 180 tablet 1  ? finasteride (PROSCAR) 5 MG tablet Take 5 mg by mouth daily as needed (retention).     ? folic acid (FOLVITE) 1 MG tablet Take 1 mg by mouth daily.    ? furosemide (LASIX) 40 MG tablet Take 1 tablet (40 mg total) by mouth daily as needed. Pt needs to make appt with provider for further refills - 2nd attempt 15 tablet 0  ? leflunomide (ARAVA) 20 MG tablet Take 20 mg by mouth daily.    ? levothyroxine (SYNTHROID) 75 MCG tablet 1 tablet in the morning on an empty stomach    ? Multiple Vitamin (MULTIVITAMIN WITH MINERALS) TABS tablet Take 1 tablet by mouth daily.    ? pantoprazole (PROTONIX) 40 MG tablet Take 40 mg by mouth daily before breakfast.     ? Polyethyl Glycol-Propyl Glycol (LUBRICANT EYE DROPS) 0.4-0.3 % SOLN Place 1 drop into both eyes 3 (three) times daily as needed (dry/irritated eyes.).    ? potassium chloride SA (KLOR-CON) 20 MEQ tablet Take 1 tablet (20 mEq total) by mouth daily. NEED OV. 90 tablet 0  ? predniSONE (DELTASONE) 5 MG tablet Take 5 mg by mouth daily with breakfast.     ? torsemide (DEMADEX) 20 MG tablet 1 tablet    ? traMADol (ULTRAM) 50 MG tablet Take 50 mg  by mouth every 6 (six) hours as needed.    ? ?No current facility-administered medications for this visit.  ? ? ?PHYSICAL EXAMINATION: ?ECOG PERFORMANCE STATUS: 2 - Symptomatic, <50% confined to bed ? ?Vitals:  ? 11/14/21 1044  ?BP: (!) 115/54  ?Pulse: (!) 59  ?Resp: 18  ?Temp: 97.7 ?F (36.5 ?C)  ?SpO2: 100%  ? ?Filed Weights  ? 11/14/21 1044  ?Weight: 110 lb 1.6 oz (49.9 kg)  ? ?  ? ?LABORATORY DATA:  ?I have reviewed the data as listed ?CMP Latest Ref Rng & Units 10/24/2021 10/04/2021 09/12/2021  ?Glucose 70 - 99 mg/dL 80 98 94  ?BUN 8 - 23 mg/dL 43(H) 33(H) 28(H)  ?Creatinine 0.61 - 1.24 mg/dL 2.07(H) 1.98(H) 1.84(H)  ?Sodium 135 - 145 mmol/L 143 141 139  ?Potassium 3.5 - 5.1 mmol/L 3.1(L) 4.1 3.8  ?Chloride 98 - 111 mmol/L 104 104 103  ?CO2 22 - 32 mmol/L '31 30 29  '$ ?Calcium 8.9 - 10.3 mg/dL 8.9 8.5(L) 9.1  ?Total Protein 6.5 - 8.1 g/dL 5.7(L) 5.9(L) 5.8(L)  ?Total Bilirubin 0.3 - 1.2 mg/dL 0.5 0.5 0.6  ?Alkaline Phos 38 - 126 U/L 44 45 48  ?AST 15 - 41 U/L '22 21 24  '$ ?ALT 0 - 44 U/L '11 11 13  '$ ? ? ?Lab Results  ?  Component Value Date  ? WBC 7.2 11/14/2021  ? HGB 7.3 (L) 11/14/2021  ? HCT 24.0 (L) 11/14/2021  ? MCV 74.5 (L) 11/14/2021  ? PLT 141 (L) 11/14/2021  ? NEUTROABS 6.0 11/14/2021  ? ? ?ASSESSMENT & PLAN:  ?Anemia of chronic disease ?Hemoglobin electrophoresis: Beta thalassemia minor.  This is the cause of microcytosis.  It is not iron deficiency related. ?  ?Possible differential: Anemia of chronic disease versus myelodysplastic syndrome versus methotrexate related toxicity. ?His methotrexate has been on hold.  However his hemoglobin continues to remain low ?  ?Hospitalization 09/22/2019-09/25/2019: A. fib with RVR status post cardioversion, currently on Eliquis ?  ?Lab review: ?09/25/2019: WBC 7.2, hemoglobin 9.6, platelets 145 ?10/03/19: WBC: 7.5, Hb 8.9, Pl: 135 ?04/18/2021: Hemoglobin 8.7 (Retacrit), blood transfusion on 04/04/2021 for hemoglobin of 7.7  ?05/30/21: Hemoglobin 6.7, blood transfusion, platelets 85  (will need to be watched) ?08/23/2021: Hemoglobin 9.6 (Retacrit) ?10/04/2021: Hemoglobin 8, platelets 151 (2 units of PRBC) ?11/14/21: Hb 7.3 ?  ?His anemia is due to chronic kidney disease stage III.   ?He will get 2 units of PRBC today. ? ?Return to clinic in 3 weeks for labs and blood transfusion if needed. ?I will see him back in 6 weeks. ? ? ? ?No orders of the defined types were placed in this encounter. ? ?The patient has a good understanding of the overall plan. he agrees with it. he will call with any problems that may develop before the next visit here. ?Total time spent: 30 mins including face to face time and time spent for planning, charting and co-ordination of care ? ? [name], MD ?11/14/21 ? ?I, Gardiner Coins is acting as a Education administrator for Dr. Lindi Adie  ?  ?

## 2021-11-14 ENCOUNTER — Inpatient Hospital Stay: Payer: Medicare PPO | Attending: Hematology and Oncology

## 2021-11-14 ENCOUNTER — Other Ambulatory Visit: Payer: Self-pay

## 2021-11-14 ENCOUNTER — Other Ambulatory Visit: Payer: Self-pay | Admitting: *Deleted

## 2021-11-14 ENCOUNTER — Inpatient Hospital Stay: Payer: Medicare PPO

## 2021-11-14 ENCOUNTER — Inpatient Hospital Stay: Payer: Medicare PPO | Admitting: Hematology and Oncology

## 2021-11-14 VITALS — BP 115/75 | HR 50 | Temp 97.5°F | Resp 18

## 2021-11-14 DIAGNOSIS — D638 Anemia in other chronic diseases classified elsewhere: Secondary | ICD-10-CM | POA: Diagnosis not present

## 2021-11-14 DIAGNOSIS — D563 Thalassemia minor: Secondary | ICD-10-CM | POA: Diagnosis not present

## 2021-11-14 DIAGNOSIS — N183 Chronic kidney disease, stage 3 unspecified: Secondary | ICD-10-CM | POA: Diagnosis not present

## 2021-11-14 DIAGNOSIS — Z7901 Long term (current) use of anticoagulants: Secondary | ICD-10-CM | POA: Insufficient documentation

## 2021-11-14 DIAGNOSIS — I4891 Unspecified atrial fibrillation: Secondary | ICD-10-CM | POA: Diagnosis not present

## 2021-11-14 DIAGNOSIS — D631 Anemia in chronic kidney disease: Secondary | ICD-10-CM | POA: Insufficient documentation

## 2021-11-14 DIAGNOSIS — N1832 Chronic kidney disease, stage 3b: Secondary | ICD-10-CM | POA: Insufficient documentation

## 2021-11-14 LAB — CBC WITH DIFFERENTIAL (CANCER CENTER ONLY)
Abs Immature Granulocytes: 0.03 10*3/uL (ref 0.00–0.07)
Basophils Absolute: 0 10*3/uL (ref 0.0–0.1)
Basophils Relative: 0 %
Eosinophils Absolute: 0.1 10*3/uL (ref 0.0–0.5)
Eosinophils Relative: 1 %
HCT: 24 % — ABNORMAL LOW (ref 39.0–52.0)
Hemoglobin: 7.3 g/dL — ABNORMAL LOW (ref 13.0–17.0)
Immature Granulocytes: 0 %
Lymphocytes Relative: 8 %
Lymphs Abs: 0.6 10*3/uL — ABNORMAL LOW (ref 0.7–4.0)
MCH: 22.7 pg — ABNORMAL LOW (ref 26.0–34.0)
MCHC: 30.4 g/dL (ref 30.0–36.0)
MCV: 74.5 fL — ABNORMAL LOW (ref 80.0–100.0)
Monocytes Absolute: 0.5 10*3/uL (ref 0.1–1.0)
Monocytes Relative: 6 %
Neutro Abs: 6 10*3/uL (ref 1.7–7.7)
Neutrophils Relative %: 85 %
Platelet Count: 141 10*3/uL — ABNORMAL LOW (ref 150–400)
RBC: 3.22 MIL/uL — ABNORMAL LOW (ref 4.22–5.81)
RDW: 21.4 % — ABNORMAL HIGH (ref 11.5–15.5)
WBC Count: 7.2 10*3/uL (ref 4.0–10.5)
nRBC: 0.4 % — ABNORMAL HIGH (ref 0.0–0.2)

## 2021-11-14 LAB — CMP (CANCER CENTER ONLY)
ALT: 10 U/L (ref 0–44)
AST: 18 U/L (ref 15–41)
Albumin: 3.6 g/dL (ref 3.5–5.0)
Alkaline Phosphatase: 44 U/L (ref 38–126)
Anion gap: 5 (ref 5–15)
BUN: 31 mg/dL — ABNORMAL HIGH (ref 8–23)
CO2: 29 mmol/L (ref 22–32)
Calcium: 8.9 mg/dL (ref 8.9–10.3)
Chloride: 107 mmol/L (ref 98–111)
Creatinine: 1.72 mg/dL — ABNORMAL HIGH (ref 0.61–1.24)
GFR, Estimated: 38 mL/min — ABNORMAL LOW (ref >60)
Glucose, Bld: 101 mg/dL — ABNORMAL HIGH (ref 70–99)
Potassium: 4.4 mmol/L (ref 3.5–5.1)
Sodium: 141 mmol/L (ref 135–145)
Total Bilirubin: 0.6 mg/dL (ref 0.3–1.2)
Total Protein: 5.4 g/dL — ABNORMAL LOW (ref 6.5–8.1)

## 2021-11-14 LAB — PREPARE RBC (CROSSMATCH)

## 2021-11-14 LAB — SAMPLE TO BLOOD BANK

## 2021-11-14 MED ORDER — EPOETIN ALFA-EPBX 40000 UNIT/ML IJ SOLN
40000.0000 [IU] | Freq: Once | INTRAMUSCULAR | Status: AC
  Administered 2021-11-14: 40000 [IU] via SUBCUTANEOUS
  Filled 2021-11-14: qty 1

## 2021-11-14 MED ORDER — SODIUM CHLORIDE 0.9% IV SOLUTION
250.0000 mL | Freq: Once | INTRAVENOUS | Status: AC
  Administered 2021-11-14: 250 mL via INTRAVENOUS

## 2021-11-14 MED ORDER — DIPHENHYDRAMINE HCL 25 MG PO CAPS
25.0000 mg | ORAL_CAPSULE | Freq: Once | ORAL | Status: AC
  Administered 2021-11-14: 25 mg via ORAL
  Filled 2021-11-14: qty 1

## 2021-11-14 MED ORDER — ACETAMINOPHEN 325 MG PO TABS
650.0000 mg | ORAL_TABLET | Freq: Once | ORAL | Status: AC
  Administered 2021-11-14: 650 mg via ORAL
  Filled 2021-11-14: qty 2

## 2021-11-14 NOTE — Assessment & Plan Note (Signed)
Hemoglobin electrophoresis: Beta thalassemia minor.??This is the cause of microcytosis. ?It is not iron deficiency related. ?? ?Possible differential:?Anemia of chronic disease versus myelodysplastic syndrome versus methotrexate related toxicity. ?His methotrexate has been on hold.??However his hemoglobin continues to remain low ?? ?Hospitalization 09/22/2019-09/25/2019: A. fib with RVR status post cardioversion, currently on Eliquis ?? ?Lab review: ?09/25/2019: WBC 7.2, hemoglobin 9.6, platelets 145 ?10/03/19: WBC: 7.5, Hb 8.9, Pl: 135 ?04/18/2021: Hemoglobin 8.7 (Retacrit), blood transfusion on 04/04/2021 for hemoglobin of 7.7? ?05/30/21:?Hemoglobin 6.7, blood transfusion, platelets 85 (will need to be watched) ?08/23/2021: Hemoglobin 9.6 (Retacrit) ?10/04/2021: Hemoglobin 8, platelets 151 (2 units of PRBC) ?? ?His anemia is due to chronic?kidney?disease?stage III.?? ?He will get 2 units of PRBC today. ? ?Return to clinic in 3 weeks for labs and blood transfusion if needed. ?I will see him back in 6 weeks. ?

## 2021-11-14 NOTE — Progress Notes (Signed)
Pt hgb 7.3.  verbal orders received by MD for pt to receive 2 units PRBC's today.  Orders placed.  ?

## 2021-11-14 NOTE — Patient Instructions (Signed)
Blood Transfusion, Adult, Care After This sheet gives you information about how to care for yourself after your procedure. Your doctor may also give you more specific instructions. If you have problems or questions, contact your doctor. What can I expect after the procedure? After the procedure, it is common to have: Bruising and soreness at the IV site. A headache. Follow these instructions at home: Insertion site care   Follow instructions from your doctor about how to take care of your insertion site. This is where an IV tube was put into your vein. Make sure you: Wash your hands with soap and water before and after you change your bandage (dressing). If you cannot use soap and water, use hand sanitizer. Change your bandage as told by your doctor. Check your insertion site every day for signs of infection. Check for: Redness, swelling, or pain. Bleeding from the site. Warmth. Pus or a bad smell. General instructions Take over-the-counter and prescription medicines only as told by your doctor. Rest as told by your doctor. Go back to your normal activities as told by your doctor. Keep all follow-up visits as told by your doctor. This is important. Contact a doctor if: You have itching or red, swollen areas of skin (hives). You feel worried or nervous (anxious). You feel weak after doing your normal activities. You have redness, swelling, warmth, or pain around the insertion site. You have blood coming from the insertion site, and the blood does not stop with pressure. You have pus or a bad smell coming from the insertion site. Get help right away if: You have signs of a serious reaction. This may be coming from an allergy or the body's defense system (immune system). Signs include: Trouble breathing or shortness of breath. Swelling of the face or feeling warm (flushed). Fever or chills. Head, chest, or back pain. Dark pee (urine) or blood in the pee. Widespread rash. Fast  heartbeat. Feeling dizzy or light-headed. You may receive your blood transfusion in an outpatient setting. If so, you will be told whom to contact to report any reactions. These symptoms may be an emergency. Do not wait to see if the symptoms will go away. Get medical help right away. Call your local emergency services (911 in the [place] Do not drive yourself to the hospital. Summary Bruising and soreness at the IV site are common. Check your insertion site every day for signs of infection. Rest as told by your doctor. Go back to your normal activities as told by your doctor. Get help right away if you have signs of a serious reaction. This information is not intended to replace advice given to you by your health care provider. Make sure you discuss any questions you have with your health care provider. Document Revised: 12/16/2020 Document Reviewed: 02/13/2019 Elsevier Patient Education  2022 Elsevier Inc.  

## 2021-11-15 LAB — TYPE AND SCREEN
ABO/RH(D): A POS
Antibody Screen: NEGATIVE
Unit division: 0
Unit division: 0

## 2021-11-15 LAB — BPAM RBC
Blood Product Expiration Date: 202303192359
Blood Product Expiration Date: 202304032359
ISSUE DATE / TIME: 202303131159
ISSUE DATE / TIME: 202303131159
Unit Type and Rh: 6200
Unit Type and Rh: 6200

## 2021-11-25 DIAGNOSIS — M810 Age-related osteoporosis without current pathological fracture: Secondary | ICD-10-CM | POA: Diagnosis not present

## 2021-12-05 ENCOUNTER — Other Ambulatory Visit: Payer: Self-pay

## 2021-12-05 ENCOUNTER — Inpatient Hospital Stay: Payer: Medicare PPO

## 2021-12-05 ENCOUNTER — Inpatient Hospital Stay: Payer: Medicare PPO | Attending: Hematology and Oncology

## 2021-12-05 VITALS — BP 114/64 | HR 57 | Temp 98.2°F | Resp 18

## 2021-12-05 DIAGNOSIS — Z7901 Long term (current) use of anticoagulants: Secondary | ICD-10-CM | POA: Insufficient documentation

## 2021-12-05 DIAGNOSIS — N1832 Chronic kidney disease, stage 3b: Secondary | ICD-10-CM | POA: Insufficient documentation

## 2021-12-05 DIAGNOSIS — D61818 Other pancytopenia: Secondary | ICD-10-CM | POA: Insufficient documentation

## 2021-12-05 DIAGNOSIS — I4891 Unspecified atrial fibrillation: Secondary | ICD-10-CM | POA: Diagnosis not present

## 2021-12-05 DIAGNOSIS — D631 Anemia in chronic kidney disease: Secondary | ICD-10-CM | POA: Diagnosis not present

## 2021-12-05 DIAGNOSIS — D638 Anemia in other chronic diseases classified elsewhere: Secondary | ICD-10-CM

## 2021-12-05 DIAGNOSIS — D563 Thalassemia minor: Secondary | ICD-10-CM | POA: Insufficient documentation

## 2021-12-05 LAB — CMP (CANCER CENTER ONLY)
ALT: 12 U/L (ref 0–44)
AST: 21 U/L (ref 15–41)
Albumin: 3.6 g/dL (ref 3.5–5.0)
Alkaline Phosphatase: 45 U/L (ref 38–126)
Anion gap: 6 (ref 5–15)
BUN: 27 mg/dL — ABNORMAL HIGH (ref 8–23)
CO2: 29 mmol/L (ref 22–32)
Calcium: 8.8 mg/dL — ABNORMAL LOW (ref 8.9–10.3)
Chloride: 107 mmol/L (ref 98–111)
Creatinine: 1.87 mg/dL — ABNORMAL HIGH (ref 0.61–1.24)
GFR, Estimated: 35 mL/min — ABNORMAL LOW (ref >60)
Glucose, Bld: 104 mg/dL — ABNORMAL HIGH (ref 70–99)
Potassium: 4 mmol/L (ref 3.5–5.1)
Sodium: 142 mmol/L (ref 135–145)
Total Bilirubin: 0.5 mg/dL (ref 0.3–1.2)
Total Protein: 5.8 g/dL — ABNORMAL LOW (ref 6.5–8.1)

## 2021-12-05 LAB — CBC WITH DIFFERENTIAL (CANCER CENTER ONLY)
Abs Immature Granulocytes: 0.04 10*3/uL (ref 0.00–0.07)
Basophils Absolute: 0 10*3/uL (ref 0.0–0.1)
Basophils Relative: 0 %
Eosinophils Absolute: 0.1 10*3/uL (ref 0.0–0.5)
Eosinophils Relative: 2 %
HCT: 27.9 % — ABNORMAL LOW (ref 39.0–52.0)
Hemoglobin: 8.7 g/dL — ABNORMAL LOW (ref 13.0–17.0)
Immature Granulocytes: 1 %
Lymphocytes Relative: 6 %
Lymphs Abs: 0.5 10*3/uL — ABNORMAL LOW (ref 0.7–4.0)
MCH: 23.7 pg — ABNORMAL LOW (ref 26.0–34.0)
MCHC: 31.2 g/dL (ref 30.0–36.0)
MCV: 76 fL — ABNORMAL LOW (ref 80.0–100.0)
Monocytes Absolute: 0.4 10*3/uL (ref 0.1–1.0)
Monocytes Relative: 5 %
Neutro Abs: 6.9 10*3/uL (ref 1.7–7.7)
Neutrophils Relative %: 86 %
Platelet Count: 129 10*3/uL — ABNORMAL LOW (ref 150–400)
RBC: 3.67 MIL/uL — ABNORMAL LOW (ref 4.22–5.81)
RDW: 20.1 % — ABNORMAL HIGH (ref 11.5–15.5)
WBC Count: 7.9 10*3/uL (ref 4.0–10.5)
nRBC: 0.3 % — ABNORMAL HIGH (ref 0.0–0.2)

## 2021-12-05 LAB — SAMPLE TO BLOOD BANK

## 2021-12-05 MED ORDER — EPOETIN ALFA-EPBX 40000 UNIT/ML IJ SOLN
40000.0000 [IU] | Freq: Once | INTRAMUSCULAR | Status: AC
  Administered 2021-12-05: 40000 [IU] via SUBCUTANEOUS
  Filled 2021-12-05: qty 1

## 2021-12-05 NOTE — Progress Notes (Signed)
Patient Hgb is 8.7 today. Patient stated that he feels great. No need for blood today. Epoetin injection given.  ? ?

## 2021-12-12 NOTE — Progress Notes (Signed)
? ?Patient Care Team: ?Lavone Orn, MD as PCP - General (Internal Medicine) ?Sueanne Margarita, MD as PCP - Cardiology (Cardiology) ?Sueanne Margarita, MD as Consulting Physician (Cardiology) ? ?DIAGNOSIS:  ?Encounter Diagnosis  ?Name Primary?  ? Other pancytopenia ([place])   ? ?  ?CHIEF COMPLIANT: Follow-up of anemia, thrombocytopenia ? ?INTERVAL HISTORY: Joshua Hudson is a 87 y.o. with above-mentioned history of anemia and thrombocytopenia currently on treatment with Aranesp. he presents to the clinic today for follow-up.  He feels extremely fatigued.  He has been requiring blood transfusions every 6 weeks. ? ?ALLERGIES:  has No Known Allergies. ? ?MEDICATIONS:  ?Current Outpatient Medications  ?Medication Sig Dispense Refill  ? amiodarone (PACERONE) 200 MG tablet 200 mg daily EXCEPT on Sunday 90 tablet 3  ? ELIQUIS 2.5 MG TABS tablet TAKE 1 TABLET(2.5 MG) BY MOUTH TWICE DAILY 180 tablet 1  ? finasteride (PROSCAR) 5 MG tablet Take 5 mg by mouth daily as needed (retention).     ? folic acid (FOLVITE) 1 MG tablet Take 1 mg by mouth daily.    ? furosemide (LASIX) 40 MG tablet Take 1 tablet (40 mg total) by mouth daily as needed. Pt needs to make appt with provider for further refills - 2nd attempt 15 tablet 0  ? leflunomide (ARAVA) 20 MG tablet Take 20 mg by mouth daily.    ? levothyroxine (SYNTHROID) 75 MCG tablet 1 tablet in the morning on an empty stomach    ? Metoprolol Succinate 25 MG CS24 25 mg.    ? Multiple Vitamin (MULTIVITAMIN WITH MINERALS) TABS tablet Take 1 tablet by mouth daily.    ? pantoprazole (PROTONIX) 40 MG tablet Take 40 mg by mouth daily before breakfast.     ? Polyethyl Glycol-Propyl Glycol (LUBRICANT EYE DROPS) 0.4-0.3 % SOLN Place 1 drop into both eyes 3 (three) times daily as needed (dry/irritated eyes.).    ? potassium chloride SA (KLOR-CON) 20 MEQ tablet Take 1 tablet (20 mEq total) by mouth daily. NEED OV. 90 tablet 0  ? predniSONE (DELTASONE) 5 MG tablet Take 5 mg by mouth daily with  breakfast.     ? torsemide (DEMADEX) 20 MG tablet 1 tablet    ? traMADol (ULTRAM) 50 MG tablet Take 50 mg by mouth every 6 (six) hours as needed.    ? ?No current facility-administered medications for this visit.  ? ? ?PHYSICAL EXAMINATION: ?ECOG PERFORMANCE STATUS: 2 - Symptomatic, <50% confined to bed ? ?Vitals:  ? 12/26/21 1040  ?BP: 109/77  ?Pulse: 83  ?Resp: 18  ?Temp: 97.7 ?F (36.5 ?C)  ?SpO2: 98%  ? ?Filed Weights  ? 12/26/21 1040  ?Weight: 111 lb 9.6 oz (50.6 kg)  ? ?  ? ?LABORATORY DATA:  ?I have reviewed the data as listed ? ?  Latest Ref Rng & Units 12/26/2021  ? 10:05 AM 12/05/2021  ?  9:19 AM 11/14/2021  ? 10:31 AM  ?CMP  ?Glucose 70 - 99 mg/dL 112   104   101    ?BUN 8 - 23 mg/dL '27   27   31    '$ ?Creatinine 0.61 - 1.24 mg/dL 1.86   1.87   1.72    ?Sodium 135 - 145 mmol/L 141   142   141    ?Potassium 3.5 - 5.1 mmol/L 3.9   4.0   4.4    ?Chloride 98 - 111 mmol/L 107   107   107    ?CO2 22 - 32 mmol/L  $'28   29   29    'T$ ?Calcium 8.9 - 10.3 mg/dL 8.8   8.8   8.9    ?Total Protein 6.5 - 8.1 g/dL 5.9   5.8   5.4    ?Total Bilirubin 0.3 - 1.2 mg/dL 0.5   0.5   0.6    ?Alkaline Phos 38 - 126 U/L 40   45   44    ?AST 15 - 41 U/L '24   21   18    '$ ?ALT 0 - 44 U/L '12   12   10    '$ ? ? ?Lab Results  ?Component Value Date  ? WBC 6.8 12/26/2021  ? HGB 7.1 (L) 12/26/2021  ? HCT 23.1 (L) 12/26/2021  ? MCV 74.3 (L) 12/26/2021  ? PLT 118 (L) 12/26/2021  ? NEUTROABS 5.8 12/26/2021  ? ? ?ASSESSMENT & PLAN:  ?Other pancytopenia ([place]) ?Hemoglobin electrophoresis: Beta thalassemia minor.  This is the cause of microcytosis.  It is not iron deficiency related. ?  ?Possible differential: Anemia of chronic disease versus myelodysplastic syndrome versus methotrexate related toxicity. ?  ?  ?Hospitalization 09/22/2019-09/25/2019: A. fib with RVR status post cardioversion, currently on Eliquis ?  ?Lab review: ?09/25/2019: WBC 7.2, hemoglobin 9.6, platelets 145 ?10/03/19: WBC: 7.5, Hb 8.9, Pl: 135 ?04/18/2021: Hemoglobin 8.7 (Retacrit), blood  transfusion on 04/04/2021 for hemoglobin of 7.7  ?05/30/21: Hemoglobin 6.7, blood transfusion, platelets 85 (will need to be watched) ?08/23/2021: Hemoglobin 9.6 (Retacrit) ?10/04/2021: Hemoglobin 8, platelets 151 (2 units of PRBC) ?12/05/2021: Hemoglobin 8.7 ?12/26/2021: Hemoglobin 7.1, platelets 118 ?  ?His anemia is due to chronic kidney disease stage III.   ?He will get 2 units of PRBC today.  (Seems to get blood transfusions every 6 weeks) ?  ?Return to clinic in 3 weeks for labs and blood transfusion if needed. ?I will see him back in 6 weeks. ?  ? ? ? ?No orders of the defined types were placed in this encounter. ? ?The patient has a good understanding of the overall plan. he agrees with it. he will call with any problems that may develop before the next visit here. ?Total time spent: 30 mins including face to face time and time spent for planning, charting and co-ordination of care ? ? [name], MD ?12/26/21 ? ? ? I Gardiner Coins am scribing for Dr. Lindi Adie ? ?I have reviewed the above documentation for accuracy and completeness, and I agree with the above. ?  ?

## 2021-12-19 DIAGNOSIS — H353211 Exudative age-related macular degeneration, right eye, with active choroidal neovascularization: Secondary | ICD-10-CM | POA: Diagnosis not present

## 2021-12-20 DIAGNOSIS — I5040 Unspecified combined systolic (congestive) and diastolic (congestive) heart failure: Secondary | ICD-10-CM | POA: Diagnosis not present

## 2021-12-20 DIAGNOSIS — I480 Paroxysmal atrial fibrillation: Secondary | ICD-10-CM | POA: Diagnosis not present

## 2021-12-26 ENCOUNTER — Inpatient Hospital Stay: Payer: Medicare PPO | Admitting: Hematology and Oncology

## 2021-12-26 ENCOUNTER — Other Ambulatory Visit: Payer: Self-pay | Admitting: *Deleted

## 2021-12-26 ENCOUNTER — Inpatient Hospital Stay: Payer: Medicare PPO

## 2021-12-26 ENCOUNTER — Other Ambulatory Visit: Payer: Self-pay

## 2021-12-26 VITALS — BP 125/81 | HR 96 | Temp 97.9°F | Resp 18

## 2021-12-26 DIAGNOSIS — D638 Anemia in other chronic diseases classified elsewhere: Secondary | ICD-10-CM

## 2021-12-26 DIAGNOSIS — D61818 Other pancytopenia: Secondary | ICD-10-CM | POA: Diagnosis not present

## 2021-12-26 DIAGNOSIS — D563 Thalassemia minor: Secondary | ICD-10-CM | POA: Diagnosis not present

## 2021-12-26 DIAGNOSIS — N1832 Chronic kidney disease, stage 3b: Secondary | ICD-10-CM | POA: Diagnosis not present

## 2021-12-26 DIAGNOSIS — Z7901 Long term (current) use of anticoagulants: Secondary | ICD-10-CM | POA: Diagnosis not present

## 2021-12-26 DIAGNOSIS — I4891 Unspecified atrial fibrillation: Secondary | ICD-10-CM | POA: Diagnosis not present

## 2021-12-26 DIAGNOSIS — D631 Anemia in chronic kidney disease: Secondary | ICD-10-CM | POA: Diagnosis not present

## 2021-12-26 LAB — CMP (CANCER CENTER ONLY)
ALT: 12 U/L (ref 0–44)
AST: 24 U/L (ref 15–41)
Albumin: 3.8 g/dL (ref 3.5–5.0)
Alkaline Phosphatase: 40 U/L (ref 38–126)
Anion gap: 6 (ref 5–15)
BUN: 27 mg/dL — ABNORMAL HIGH (ref 8–23)
CO2: 28 mmol/L (ref 22–32)
Calcium: 8.8 mg/dL — ABNORMAL LOW (ref 8.9–10.3)
Chloride: 107 mmol/L (ref 98–111)
Creatinine: 1.86 mg/dL — ABNORMAL HIGH (ref 0.61–1.24)
GFR, Estimated: 35 mL/min — ABNORMAL LOW (ref >60)
Glucose, Bld: 112 mg/dL — ABNORMAL HIGH (ref 70–99)
Potassium: 3.9 mmol/L (ref 3.5–5.1)
Sodium: 141 mmol/L (ref 135–145)
Total Bilirubin: 0.5 mg/dL (ref 0.3–1.2)
Total Protein: 5.9 g/dL — ABNORMAL LOW (ref 6.5–8.1)

## 2021-12-26 LAB — CBC WITH DIFFERENTIAL (CANCER CENTER ONLY)
Abs Immature Granulocytes: 0.04 10*3/uL (ref 0.00–0.07)
Basophils Absolute: 0 10*3/uL (ref 0.0–0.1)
Basophils Relative: 1 %
Eosinophils Absolute: 0.1 10*3/uL (ref 0.0–0.5)
Eosinophils Relative: 1 %
HCT: 23.1 % — ABNORMAL LOW (ref 39.0–52.0)
Hemoglobin: 7.1 g/dL — ABNORMAL LOW (ref 13.0–17.0)
Immature Granulocytes: 1 %
Lymphocytes Relative: 7 %
Lymphs Abs: 0.5 10*3/uL — ABNORMAL LOW (ref 0.7–4.0)
MCH: 22.8 pg — ABNORMAL LOW (ref 26.0–34.0)
MCHC: 30.7 g/dL (ref 30.0–36.0)
MCV: 74.3 fL — ABNORMAL LOW (ref 80.0–100.0)
Monocytes Absolute: 0.4 10*3/uL (ref 0.1–1.0)
Monocytes Relative: 6 %
Neutro Abs: 5.8 10*3/uL (ref 1.7–7.7)
Neutrophils Relative %: 84 %
Platelet Count: 118 10*3/uL — ABNORMAL LOW (ref 150–400)
RBC: 3.11 MIL/uL — ABNORMAL LOW (ref 4.22–5.81)
RDW: 20.2 % — ABNORMAL HIGH (ref 11.5–15.5)
WBC Count: 6.8 10*3/uL (ref 4.0–10.5)
nRBC: 0.3 % — ABNORMAL HIGH (ref 0.0–0.2)

## 2021-12-26 LAB — SAMPLE TO BLOOD BANK

## 2021-12-26 LAB — PREPARE RBC (CROSSMATCH)

## 2021-12-26 MED ORDER — ACETAMINOPHEN 325 MG PO TABS
650.0000 mg | ORAL_TABLET | Freq: Once | ORAL | Status: AC
  Administered 2021-12-26: 650 mg via ORAL
  Filled 2021-12-26: qty 2

## 2021-12-26 MED ORDER — SODIUM CHLORIDE 0.9% IV SOLUTION
250.0000 mL | Freq: Once | INTRAVENOUS | Status: AC
  Administered 2021-12-26: 250 mL via INTRAVENOUS

## 2021-12-26 MED ORDER — DIPHENHYDRAMINE HCL 25 MG PO CAPS
25.0000 mg | ORAL_CAPSULE | Freq: Once | ORAL | Status: AC
  Administered 2021-12-26: 25 mg via ORAL
  Filled 2021-12-26: qty 1

## 2021-12-26 MED ORDER — EPOETIN ALFA-EPBX 40000 UNIT/ML IJ SOLN
40000.0000 [IU] | Freq: Once | INTRAMUSCULAR | Status: AC
  Administered 2021-12-26: 40000 [IU] via SUBCUTANEOUS
  Filled 2021-12-26: qty 1

## 2021-12-26 NOTE — Patient Instructions (Signed)
Blood Transfusion, Adult, Care After This sheet gives you information about how to care for yourself after your procedure. Your doctor may also give you more specific instructions. If you have problems or questions, contact your doctor. What can I expect after the procedure? After the procedure, it is common to have: Bruising and soreness at the IV site. A headache. Follow these instructions at home: Insertion site care   Follow instructions from your doctor about how to take care of your insertion site. This is where an IV tube was put into your vein. Make sure you: Wash your hands with soap and water before and after you change your bandage (dressing). If you cannot use soap and water, use hand sanitizer. Change your bandage as told by your doctor. Check your insertion site every day for signs of infection. Check for: Redness, swelling, or pain. Bleeding from the site. Warmth. Pus or a bad smell. General instructions Take over-the-counter and prescription medicines only as told by your doctor. Rest as told by your doctor. Go back to your normal activities as told by your doctor. Keep all follow-up visits as told by your doctor. This is important. Contact a doctor if: You have itching or red, swollen areas of skin (hives). You feel worried or nervous (anxious). You feel weak after doing your normal activities. You have redness, swelling, warmth, or pain around the insertion site. You have blood coming from the insertion site, and the blood does not stop with pressure. You have pus or a bad smell coming from the insertion site. Get help right away if: You have signs of a serious reaction. This may be coming from an allergy or the body's defense system (immune system). Signs include: Trouble breathing or shortness of breath. Swelling of the face or feeling warm (flushed). Fever or chills. Head, chest, or back pain. Dark pee (urine) or blood in the pee. Widespread rash. Fast  heartbeat. Feeling dizzy or light-headed. You may receive your blood transfusion in an outpatient setting. If so, you will be told whom to contact to report any reactions. These symptoms may be an emergency. Do not wait to see if the symptoms will go away. Get medical help right away. Call your local emergency services (911 in the [place] Do not drive yourself to the hospital. Summary Bruising and soreness at the IV site are common. Check your insertion site every day for signs of infection. Rest as told by your doctor. Go back to your normal activities as told by your doctor. Get help right away if you have signs of a serious reaction. This information is not intended to replace advice given to you by your health care provider. Make sure you discuss any questions you have with your health care provider. Document Revised: 12/16/2020 Document Reviewed: 02/13/2019 Elsevier Patient Education  2022 Elsevier Inc.  

## 2021-12-26 NOTE — Progress Notes (Signed)
Verbal orders received by MD for pt to receive 2 units PRBC's today.  Orders placed and prepare released for infusion.  ?

## 2021-12-26 NOTE — Assessment & Plan Note (Addendum)
Hemoglobin electrophoresis: Beta thalassemia minor.??This is the cause of microcytosis. ?It is not iron deficiency related. ?? ?Possible differential:?Anemia of chronic disease versus myelodysplastic syndrome versus methotrexate related toxicity. ?  ?? ?Hospitalization 09/22/2019-09/25/2019: A. fib with RVR status post cardioversion, currently on Eliquis ?? ?Lab review: ?09/25/2019: WBC 7.2, hemoglobin 9.6, platelets 145 ?10/03/19: WBC: 7.5, Hb 8.9, Pl: 135 ?04/18/2021: Hemoglobin 8.7 (Retacrit), blood transfusion on 04/04/2021 for hemoglobin of 7.7? ?05/30/21:?Hemoglobin 6.7, blood transfusion, platelets 85 (will need to be watched) ?08/23/2021: Hemoglobin 9.6 (Retacrit) ?10/04/2021: Hemoglobin 8, platelets 151 (2 units of PRBC) ?12/05/2021: Hemoglobin 8.7 ?12/26/2021: ?? ?His anemia is due to chronic?kidney?disease?stage III.?? ?He will get 2 units of PRBC today.  (Seems to get blood transfusions every 6 weeks) ?? ?Return to clinic in 3 weeks for labs and blood transfusion if needed. ?I will see him back in 6 weeks. ?? ?

## 2021-12-27 ENCOUNTER — Telehealth: Payer: Self-pay | Admitting: Hematology and Oncology

## 2021-12-27 LAB — BPAM RBC
Blood Product Expiration Date: 202305152359
Blood Product Expiration Date: 202305152359
ISSUE DATE / TIME: 202304241207
ISSUE DATE / TIME: 202304241207
Unit Type and Rh: 6200
Unit Type and Rh: 6200

## 2021-12-27 LAB — TYPE AND SCREEN
ABO/RH(D): A POS
Antibody Screen: NEGATIVE
Unit division: 0
Unit division: 0

## 2021-12-27 NOTE — Telephone Encounter (Signed)
Scheduled appointment per 4/24 los. Talked with the patients daughter [name] and she asked for appointments to be earlier in the day. Daughter stated, "once it's on mychart, I will see them." Appointments were updated. ?

## 2021-12-28 DIAGNOSIS — D649 Anemia, unspecified: Secondary | ICD-10-CM | POA: Diagnosis not present

## 2021-12-28 DIAGNOSIS — M069 Rheumatoid arthritis, unspecified: Secondary | ICD-10-CM | POA: Diagnosis not present

## 2021-12-28 DIAGNOSIS — Z1331 Encounter for screening for depression: Secondary | ICD-10-CM | POA: Diagnosis not present

## 2021-12-28 DIAGNOSIS — I483 Typical atrial flutter: Secondary | ICD-10-CM | POA: Diagnosis not present

## 2021-12-28 DIAGNOSIS — I5040 Unspecified combined systolic (congestive) and diastolic (congestive) heart failure: Secondary | ICD-10-CM | POA: Diagnosis not present

## 2021-12-28 DIAGNOSIS — K219 Gastro-esophageal reflux disease without esophagitis: Secondary | ICD-10-CM | POA: Diagnosis not present

## 2021-12-28 DIAGNOSIS — I480 Paroxysmal atrial fibrillation: Secondary | ICD-10-CM | POA: Diagnosis not present

## 2021-12-28 DIAGNOSIS — Z0000 Encounter for general adult medical examination without abnormal findings: Secondary | ICD-10-CM | POA: Diagnosis not present

## 2021-12-28 DIAGNOSIS — K220 Achalasia of cardia: Secondary | ICD-10-CM | POA: Diagnosis not present

## 2021-12-28 DIAGNOSIS — E039 Hypothyroidism, unspecified: Secondary | ICD-10-CM | POA: Diagnosis not present

## 2021-12-28 DIAGNOSIS — I700 Atherosclerosis of aorta: Secondary | ICD-10-CM | POA: Diagnosis not present

## 2022-01-06 ENCOUNTER — Ambulatory Visit: Payer: Medicare PPO

## 2022-01-11 DIAGNOSIS — M0579 Rheumatoid arthritis with rheumatoid factor of multiple sites without organ or systems involvement: Secondary | ICD-10-CM | POA: Diagnosis not present

## 2022-01-12 ENCOUNTER — Other Ambulatory Visit: Payer: Self-pay | Admitting: *Deleted

## 2022-01-12 ENCOUNTER — Other Ambulatory Visit: Payer: Self-pay

## 2022-01-12 DIAGNOSIS — D638 Anemia in other chronic diseases classified elsewhere: Secondary | ICD-10-CM

## 2022-01-16 ENCOUNTER — Inpatient Hospital Stay: Payer: Medicare PPO

## 2022-01-16 ENCOUNTER — Other Ambulatory Visit: Payer: Self-pay

## 2022-01-16 ENCOUNTER — Inpatient Hospital Stay: Payer: Medicare PPO | Attending: Hematology and Oncology

## 2022-01-16 VITALS — BP 136/71 | HR 87 | Temp 98.3°F | Resp 18

## 2022-01-16 DIAGNOSIS — D638 Anemia in other chronic diseases classified elsewhere: Secondary | ICD-10-CM

## 2022-01-16 DIAGNOSIS — N1832 Chronic kidney disease, stage 3b: Secondary | ICD-10-CM | POA: Insufficient documentation

## 2022-01-16 DIAGNOSIS — D631 Anemia in chronic kidney disease: Secondary | ICD-10-CM | POA: Insufficient documentation

## 2022-01-16 DIAGNOSIS — D563 Thalassemia minor: Secondary | ICD-10-CM | POA: Insufficient documentation

## 2022-01-16 LAB — CBC WITH DIFFERENTIAL (CANCER CENTER ONLY)
Abs Immature Granulocytes: 0.03 10*3/uL (ref 0.00–0.07)
Basophils Absolute: 0 10*3/uL (ref 0.0–0.1)
Basophils Relative: 0 %
Eosinophils Absolute: 0.2 10*3/uL (ref 0.0–0.5)
Eosinophils Relative: 2 %
HCT: 29.1 % — ABNORMAL LOW (ref 39.0–52.0)
Hemoglobin: 9.1 g/dL — ABNORMAL LOW (ref 13.0–17.0)
Immature Granulocytes: 0 %
Lymphocytes Relative: 9 %
Lymphs Abs: 0.7 10*3/uL (ref 0.7–4.0)
MCH: 24 pg — ABNORMAL LOW (ref 26.0–34.0)
MCHC: 31.3 g/dL (ref 30.0–36.0)
MCV: 76.8 fL — ABNORMAL LOW (ref 80.0–100.0)
Monocytes Absolute: 0.5 10*3/uL (ref 0.1–1.0)
Monocytes Relative: 6 %
Neutro Abs: 6.3 10*3/uL (ref 1.7–7.7)
Neutrophils Relative %: 83 %
Platelet Count: 119 10*3/uL — ABNORMAL LOW (ref 150–400)
RBC: 3.79 MIL/uL — ABNORMAL LOW (ref 4.22–5.81)
RDW: 21.1 % — ABNORMAL HIGH (ref 11.5–15.5)
WBC Count: 7.6 10*3/uL (ref 4.0–10.5)
nRBC: 0.3 % — ABNORMAL HIGH (ref 0.0–0.2)

## 2022-01-16 LAB — CMP (CANCER CENTER ONLY)
ALT: 11 U/L (ref 0–44)
AST: 21 U/L (ref 15–41)
Albumin: 3.7 g/dL (ref 3.5–5.0)
Alkaline Phosphatase: 34 U/L — ABNORMAL LOW (ref 38–126)
Anion gap: 4 — ABNORMAL LOW (ref 5–15)
BUN: 28 mg/dL — ABNORMAL HIGH (ref 8–23)
CO2: 31 mmol/L (ref 22–32)
Calcium: 9 mg/dL (ref 8.9–10.3)
Chloride: 106 mmol/L (ref 98–111)
Creatinine: 1.72 mg/dL — ABNORMAL HIGH (ref 0.61–1.24)
GFR, Estimated: 38 mL/min — ABNORMAL LOW (ref >60)
Glucose, Bld: 100 mg/dL — ABNORMAL HIGH (ref 70–99)
Potassium: 4.1 mmol/L (ref 3.5–5.1)
Sodium: 141 mmol/L (ref 135–145)
Total Bilirubin: 0.6 mg/dL (ref 0.3–1.2)
Total Protein: 5.7 g/dL — ABNORMAL LOW (ref 6.5–8.1)

## 2022-01-16 LAB — SAMPLE TO BLOOD BANK

## 2022-01-16 MED ORDER — EPOETIN ALFA-EPBX 40000 UNIT/ML IJ SOLN
40000.0000 [IU] | Freq: Once | INTRAMUSCULAR | Status: AC
  Administered 2022-01-16: 40000 [IU] via SUBCUTANEOUS
  Filled 2022-01-16: qty 1

## 2022-01-16 NOTE — Patient Instructions (Signed)
Epoetin Alfa injection ?What is this medication? ?EPOETIN ALFA (e POE e tin AL fa) helps your body make more red blood cells. This medicine is used to treat anemia caused by chronic kidney disease, cancer chemotherapy, or HIV-therapy. It may also be used before surgery if you have anemia. ?This medicine may be used for other purposes; ask your health care provider or pharmacist if you have questions. ?COMMON BRAND NAME(S): Epogen, Procrit, Retacrit ?What should I tell my care team before I take this medication? ?They need to know if you have any of these conditions: ?cancer ?heart disease ?high blood pressure ?history of blood clots ?history of stroke ?low levels of folate, iron, or vitamin B12 in the blood ?seizures ?an unusual or allergic reaction to erythropoietin, albumin, benzyl alcohol, hamster proteins, other medicines, foods, dyes, or preservatives ?pregnant or trying to get pregnant ?breast-feeding ?How should I use this medication? ?This medicine is for injection into a vein or under the skin. It is usually given by a health care professional in a hospital or clinic setting. ?If you get this medicine at home, you will be taught how to prepare and give this medicine. Use exactly as directed. Take your medicine at regular intervals. Do not take your medicine more often than directed. ?It is important that you put your used needles and syringes in a special sharps container. Do not put them in a trash can. If you do not have a sharps container, call your pharmacist or healthcare provider to get one. ?A special MedGuide will be given to you by the pharmacist with each prescription and refill. Be sure to read this information carefully each time. ?Talk to your pediatrician regarding the use of this medicine in children. While this drug may be prescribed for selected conditions, precautions do apply. ?Overdosage: If you think you have taken too much of this medicine contact a poison control center or emergency  room at once. ?NOTE: This medicine is only for you. Do not share this medicine with others. ?What if I miss a dose? ?If you miss a dose, take it as soon as you can. If it is almost time for your next dose, take only that dose. Do not take double or extra doses. ?What may interact with this medication? ?Interactions have not been studied. ?This list may not describe all possible interactions. Give your health care provider a list of all the medicines, herbs, non-prescription drugs, or dietary supplements you use. Also tell them if you smoke, drink alcohol, or use illegal drugs. Some items may interact with your medicine. ?What should I watch for while using this medication? ?Your condition will be monitored carefully while you are receiving this medicine. ?You may need blood work done while you are taking this medicine. ?This medicine may cause a decrease in vitamin B6. You should make sure that you get enough vitamin B6 while you are taking this medicine. Discuss the foods you eat and the vitamins you take with your health care professional. ?What side effects may I notice from receiving this medication? ?Side effects that you should report to your doctor or health care professional as soon as possible: ?allergic reactions like skin rash, itching or hives, swelling of the face, lips, or tongue ?seizures ?signs and symptoms of a blood clot such as breathing problems; changes in vision; chest pain; severe, sudden headache; pain, swelling, warmth in the leg; trouble speaking; sudden numbness or weakness of the face, arm or leg ?signs and symptoms of a stroke like   changes in vision; confusion; trouble speaking or understanding; severe headaches; sudden numbness or weakness of the face, arm or leg; trouble walking; dizziness; loss of balance or coordination ?Side effects that usually do not require medical attention (report to your doctor or health care professional if they continue or are  bothersome): ?chills ?cough ?dizziness ?fever ?headaches ?joint pain ?muscle cramps ?muscle pain ?nausea, vomiting ?pain, redness, or irritation at site where injected ?This list may not describe all possible side effects. Call your doctor for medical advice about side effects. You may report side effects to FDA at 1-800-FDA-1088. ?Where should I keep my medication? ?Keep out of the reach of children. ?Store in a refrigerator between 2 and 8 degrees C (36 and 46 degrees F). Do not freeze or shake. Throw away any unused portion if using a single-dose vial. Multi-dose vials can be kept in the refrigerator for up to 21 days after the initial dose. Throw away unused medicine. ?NOTE: This sheet is a summary. It may not cover all possible information. If you have questions about this medicine, talk to your doctor, pharmacist, or health care provider. ?? 2023 [employer]vier/Gold Standard (2017-04-24 00:00:00) ? ?

## 2022-01-16 NOTE — Progress Notes (Signed)
Per Gudena MD, no blood today with HGB 9.1. ?

## 2022-01-23 DIAGNOSIS — H353211 Exudative age-related macular degeneration, right eye, with active choroidal neovascularization: Secondary | ICD-10-CM | POA: Diagnosis not present

## 2022-02-03 ENCOUNTER — Other Ambulatory Visit: Payer: Self-pay

## 2022-02-03 DIAGNOSIS — D638 Anemia in other chronic diseases classified elsewhere: Secondary | ICD-10-CM

## 2022-02-06 ENCOUNTER — Inpatient Hospital Stay: Payer: Medicare PPO

## 2022-02-06 ENCOUNTER — Other Ambulatory Visit: Payer: Self-pay | Admitting: *Deleted

## 2022-02-06 ENCOUNTER — Inpatient Hospital Stay: Payer: Medicare PPO | Attending: Hematology and Oncology

## 2022-02-06 ENCOUNTER — Other Ambulatory Visit: Payer: Medicare PPO

## 2022-02-06 ENCOUNTER — Ambulatory Visit: Payer: Medicare PPO | Admitting: Adult Health

## 2022-02-06 ENCOUNTER — Encounter: Payer: Self-pay | Admitting: Adult Health

## 2022-02-06 ENCOUNTER — Other Ambulatory Visit: Payer: Self-pay

## 2022-02-06 ENCOUNTER — Inpatient Hospital Stay (HOSPITAL_BASED_OUTPATIENT_CLINIC_OR_DEPARTMENT_OTHER): Payer: Medicare PPO | Admitting: Adult Health

## 2022-02-06 VITALS — BP 108/78 | HR 129 | Temp 97.5°F | Resp 18 | Ht 60.0 in | Wt 110.7 lb

## 2022-02-06 DIAGNOSIS — D563 Thalassemia minor: Secondary | ICD-10-CM | POA: Diagnosis not present

## 2022-02-06 DIAGNOSIS — D638 Anemia in other chronic diseases classified elsewhere: Secondary | ICD-10-CM

## 2022-02-06 DIAGNOSIS — D61818 Other pancytopenia: Secondary | ICD-10-CM | POA: Diagnosis not present

## 2022-02-06 DIAGNOSIS — D696 Thrombocytopenia, unspecified: Secondary | ICD-10-CM

## 2022-02-06 DIAGNOSIS — D631 Anemia in chronic kidney disease: Secondary | ICD-10-CM | POA: Insufficient documentation

## 2022-02-06 DIAGNOSIS — N1832 Chronic kidney disease, stage 3b: Secondary | ICD-10-CM | POA: Diagnosis not present

## 2022-02-06 DIAGNOSIS — D598 Other acquired hemolytic anemias: Secondary | ICD-10-CM | POA: Diagnosis not present

## 2022-02-06 LAB — CBC WITH DIFFERENTIAL (CANCER CENTER ONLY)
Abs Immature Granulocytes: 0.04 10*3/uL (ref 0.00–0.07)
Basophils Absolute: 0 10*3/uL (ref 0.0–0.1)
Basophils Relative: 0 %
Eosinophils Absolute: 0.2 10*3/uL (ref 0.0–0.5)
Eosinophils Relative: 3 %
HCT: 23.4 % — ABNORMAL LOW (ref 39.0–52.0)
Hemoglobin: 7.4 g/dL — ABNORMAL LOW (ref 13.0–17.0)
Immature Granulocytes: 1 %
Lymphocytes Relative: 7 %
Lymphs Abs: 0.5 10*3/uL — ABNORMAL LOW (ref 0.7–4.0)
MCH: 23.1 pg — ABNORMAL LOW (ref 26.0–34.0)
MCHC: 31.6 g/dL (ref 30.0–36.0)
MCV: 73.1 fL — ABNORMAL LOW (ref 80.0–100.0)
Monocytes Absolute: 0.4 10*3/uL (ref 0.1–1.0)
Monocytes Relative: 5 %
Neutro Abs: 5.8 10*3/uL (ref 1.7–7.7)
Neutrophils Relative %: 84 %
Platelet Count: 114 10*3/uL — ABNORMAL LOW (ref 150–400)
RBC: 3.2 MIL/uL — ABNORMAL LOW (ref 4.22–5.81)
RDW: 19.8 % — ABNORMAL HIGH (ref 11.5–15.5)
WBC Count: 6.9 10*3/uL (ref 4.0–10.5)
nRBC: 0.3 % — ABNORMAL HIGH (ref 0.0–0.2)

## 2022-02-06 LAB — CMP (CANCER CENTER ONLY)
ALT: 10 U/L (ref 0–44)
AST: 22 U/L (ref 15–41)
Albumin: 3.7 g/dL (ref 3.5–5.0)
Alkaline Phosphatase: 48 U/L (ref 38–126)
Anion gap: 8 (ref 5–15)
BUN: 35 mg/dL — ABNORMAL HIGH (ref 8–23)
CO2: 28 mmol/L (ref 22–32)
Calcium: 9.1 mg/dL (ref 8.9–10.3)
Chloride: 106 mmol/L (ref 98–111)
Creatinine: 1.96 mg/dL — ABNORMAL HIGH (ref 0.61–1.24)
GFR, Estimated: 33 mL/min — ABNORMAL LOW (ref >60)
Glucose, Bld: 105 mg/dL — ABNORMAL HIGH (ref 70–99)
Potassium: 3.8 mmol/L (ref 3.5–5.1)
Sodium: 142 mmol/L (ref 135–145)
Total Bilirubin: 0.5 mg/dL (ref 0.3–1.2)
Total Protein: 5.8 g/dL — ABNORMAL LOW (ref 6.5–8.1)

## 2022-02-06 LAB — PREPARE RBC (CROSSMATCH)

## 2022-02-06 LAB — SAMPLE TO BLOOD BANK

## 2022-02-06 LAB — FERRITIN: Ferritin: 34 ng/mL (ref 24–336)

## 2022-02-06 MED ORDER — DIPHENHYDRAMINE HCL 25 MG PO CAPS
ORAL_CAPSULE | ORAL | Status: AC
  Filled 2022-02-06: qty 1

## 2022-02-06 MED ORDER — DIPHENHYDRAMINE HCL 25 MG PO CAPS
25.0000 mg | ORAL_CAPSULE | Freq: Once | ORAL | Status: AC
  Administered 2022-02-06: 25 mg via ORAL

## 2022-02-06 MED ORDER — ACETAMINOPHEN 325 MG PO TABS
ORAL_TABLET | ORAL | Status: AC
  Filled 2022-02-06: qty 2

## 2022-02-06 MED ORDER — SODIUM CHLORIDE 0.9% IV SOLUTION
250.0000 mL | Freq: Once | INTRAVENOUS | Status: AC
  Administered 2022-02-06: 250 mL via INTRAVENOUS

## 2022-02-06 MED ORDER — ACETAMINOPHEN 325 MG PO TABS
650.0000 mg | ORAL_TABLET | Freq: Once | ORAL | Status: AC
  Administered 2022-02-06: 650 mg via ORAL

## 2022-02-06 NOTE — Progress Notes (Signed)
[facility] Follow up:    Joshua Orn, MD [address] [place] [address]   DIAGNOSIS: Hemolytic anemia/Anemia of chronic disease  SUMMARY OF HEMATOLOGIC HISTORY:   Hospitalization 09/22/2019-09/25/2019: A. fib with RVR status post cardioversion, currently on Eliquis   Lab review: 09/25/2019: WBC 7.2, hemoglobin 9.6, platelets 145 10/03/19: WBC: 7.5, Hb 8.9, Pl: 135 04/18/2021: Hemoglobin 8.7 (Retacrit), blood transfusion on 04/04/2021 for hemoglobin of 7.7  05/30/21: Hemoglobin 6.7, blood transfusion, platelets 85 (will need to be watched) 08/23/2021: Hemoglobin 9.6 (Retacrit) 10/04/2021: Hemoglobin 8, platelets 151 (2 units of PRBC) 12/05/2021: Hemoglobin 8.7 12/26/2021: Hemoglobin 7.1, platelets 118 01/16/2022: Hemgolobin 9.1, plts 119 02/06/2022: Hemoglobin 7.4, plts 114   His anemia is due to chronic kidney disease stage III.   He will get 2 units of PRBC today.      CURRENT THERAPY: Intermittent blood transfusions, Retacrit every 3 weeks  INTERVAL HISTORY: Joshua Hudson 87 y.o. male returns for follow-up for his symptomatic anemia.  He is not feeling well today.  He is very fatigued and feels like his heart is racing.  He is lightheaded with and without position changes.  He is accompanied by his wife and they are curious if he could have his labs checked a little bit more frequently so he does not have to hit rock bottom before he receives a blood transfusion.  His weight is stable today.  He is prepared to receive blood.   Patient Active Problem List   Diagnosis Date Noted   Anemia of chronic disease 07/26/2020   Monocular vision loss 03/23/2020   New onset atrial flutter ([place]) 53/29/9242   Acute systolic (congestive) heart failure ([place]) 09/22/2019   Elevated troponin 09/22/2019   Microcytic hypochromic anemia 09/22/2019   S/P shoulder replacement, right 07/18/2019   Thrombocytopenia ([place]) 05/20/2019   Other pancytopenia ([place])  05/06/2019   Protein-calorie malnutrition, severe 04/30/2019   FTT (failure to thrive) in adult 04/28/2019   GERD (gastroesophageal reflux disease)    Esophageal stricture    History of esophageal stricture    Odynophagia    Hypertension    Rheumatoid arthritis ([place])    S/P valve-in-valve TAVR    Prosthetic valve dysfunction    Severe aortic regurgitation 04/02/2019   Mitral regurgitation 05/16/2016   Postoperative atrial fibrillation ([place]) 05/10/2015   Hemolytic anemia ([place]) 02/25/2014   Acute on chronic diastolic heart failure ([place]) 02/25/2014   Dysphagia 02/25/2014   S/P aortic valve replacement with bioprosthetic valve 01/16/2008    has No Known Allergies.  MEDICAL HISTORY: Past Medical History:  Diagnosis Date   Achalasia 06/12/2019   Noted on EGD   Aortic insufficiency 03/24/2019   AI of bioprosthetic AVR severe 4 plus   Chronic diastolic CHF (congestive heart failure) ([place]) 03/24/2019   Colon polyps    s/p diverticular perforation requiring 2-stage repair   Derangement of right shoulder joint    need replacing has no use of   Diverticulosis    DJD (degenerative joint disease), lumbosacral    Dysphagia    eats soft food   Esophageal stricture    GERD (gastroesophageal reflux disease)    Hemolytic anemia ([place])    History of blood transfusion given with 04-15-19 surgery   History of blood transfusion 07/18/2019   History of kidney stones    passed stones   Hypertension    Mitral regurgitation    moderate   Occipital neuralgia    Pancytopenia ([place])  Postoperative atrial fibrillation ([place]) 05/10/2015   Prosthetic valve dysfunction    Rheumatoid arthritis ([place])    s/o long term steroids  shoulders and hands   Rotator cuff arthropathy    right   S/P aortic valve replacement with bioprosthetic valve 01/16/2008   108m Edwards Magna perimount bovine pericardial tissue valve, model 3000   S/P valve-in-valve TAVR 04/15/2019   26 mm Edwards Sapien 3 Ultra  transcatheter heart valve placed via percutaneous right transfemoral approach    SBE (subacute bacterial endocarditis) prophylaxis candidate    for dental procedures   Schatzki's ring 06/12/2019   Narrowing, Noted on EGD   Severe aortic stenosis    S/P prosthetic valve replacement w 25 mm Edwards like science percardial tissue valve,Turner - 01/2008   Thrombocytopenia ([place]     SURGICAL HISTORY: Past Surgical History:  Procedure Laterality Date   A-FLUTTER ABLATION N/A 10/10/2019   Procedure: A-FLUTTER ABLATION;  Surgeon: TEvans Lance MD;  [facility];  Service: Cardiovascular;  Laterality: N/A;   APPENDECTOMY     BALLOON DILATION N/A 06/12/2019   Procedure: BALLOON DILATION;  Surgeon: KRonnette Juniper MD;  [facility];  Service: Gastroenterology;  Laterality: N/A;   BOTOX INJECTION N/A 01/22/2018   Procedure: BOTOX INJECTION;  Surgeon: KRonnette Juniper MD;  [facility];  Service: Gastroenterology;  Laterality: N/A;   CARDIAC CATHETERIZATION     09   CARDIAC VALVE REPLACEMENT  01/2008   aortic valve replacement   CARDIOVERSION N/A 09/25/2019   Procedure: CARDIOVERSION;  Surgeon: SJerline Pain MD;  [facility]  Service: Cardiovascular;  Laterality: N/A;   CARDIOVERSION N/A 12/25/2019   Procedure: CARDIOVERSION;  Surgeon: CLelon Perla MD;  [facility];  Service: Cardiovascular;  Laterality: N/A;   CATARACT EXTRACTION W/ INTRAOCULAR LENS  IMPLANT, BILATERAL     COLON RESECTION     diverticulitis    CORONARY ANGIOPLASTY     dental implants     permanent   ESOPHAGEAL MANOMETRY N/A 11/07/2017   Procedure: ESOPHAGEAL MANOMETRY (EM);  Surgeon: KRonnette Juniper MD;  [facility];  Service: Gastroenterology;  Laterality: N/A;   ESOPHAGOGASTRODUODENOSCOPY (EGD) WITH PROPOFOL N/A 01/22/2018   Procedure: ESOPHAGOGASTRODUODENOSCOPY (EGD) WITH PROPOFOL;  Surgeon: KRonnette Juniper MD;  [facility];  Service: Gastroenterology;   Laterality: N/A;   ESOPHAGOGASTRODUODENOSCOPY (EGD) WITH PROPOFOL N/A 04/30/2019   Procedure: ESOPHAGOGASTRODUODENOSCOPY (EGD) WITH PROPOFOL;  Surgeon: ELaurence Spates MD;  [facility]  Service: Endoscopy;  Laterality: N/A;   ESOPHAGOGASTRODUODENOSCOPY (EGD) WITH PROPOFOL N/A 06/12/2019   Procedure: ESOPHAGOGASTRODUODENOSCOPY (EGD) WITH PROPOFOL;  Surgeon: KRonnette Juniper MD;  [facility];  Service: Gastroenterology;  Laterality: N/A;  with botox injection   EXCISIONAL TOTAL SHOULDER ARTHROPLASTY WITH ANTIBIOTIC SPACER Right 12/31/2018   Procedure: EXCISIONAL TOTAL SHOULDER ARTHROPLASTY WITH ANTIBIOTIC SPACER;  Surgeon: NNetta Cedars MD;  [facility]  Service: Orthopedics;  Laterality: Right;   HERNIA REPAIR     INTRAOPERATIVE TRANSTHORACIC ECHOCARDIOGRAM N/A 04/15/2019   Procedure: Intraoperative Transthoracic Echocardiogram;  Surgeon: CSherren Mocha MD;  [facility]  Service: Open Heart Surgery;  Laterality: N/A;   IRRIGATION AND DEBRIDEMENT SHOULDER Right 11/20/2018    IRRIGATION AND DEBRIDEMENT SHOULDER WITH POLY EXCHANGE (Right Shoulder)   IRRIGATION AND DEBRIDEMENT SHOULDER Right 11/20/2018   Procedure: IRRIGATION AND DEBRIDEMENT SHOULDER WITH POLY EXCHANGE;  Surgeon: NNetta Cedars MD;  [facility]  Service: Orthopedics;  Laterality: Right;   LUMBAR LAMINECTOMY     x 2  REVERSE SHOULDER ARTHROPLASTY Right 03/01/2018   Procedure: RIGHT REVERSE SHOULDER ARTHROPLASTY;  Surgeon: Netta Cedars, MD;  Location: [facility];  Service: Orthopedics;  Laterality: Right;   REVERSE SHOULDER ARTHROPLASTY Right 07/18/2019   Procedure: REVERSE TOTAL SHOULDER ARTHROPLASTY and removal of antiobotic spacer;  Surgeon: Netta Cedars, MD;  [facility];  Service: Orthopedics;  Laterality: Right;  interscalene block   REVERSE SHOULDER ARTHROPLASTY Right 08/06/2019   Procedure: Reduction of dislocated reverse total shoulder and poly exchange;  Surgeon: Netta Cedars, MD;  [facility];  Service: Orthopedics;  Laterality: Right;  need 1 hour   RIGHT/LEFT HEART CATH AND CORONARY ANGIOGRAPHY N/A 04/02/2019   Procedure: RIGHT/LEFT HEART CATH AND CORONARY ANGIOGRAPHY;  Surgeon: Leonie Man, MD;  Location: [facility];  Service: Cardiovascular;  Laterality: N/A;   SAVORY DILATION N/A 01/22/2018   Procedure: SAVORY DILATION;  Surgeon: Ronnette Juniper, MD;  [facility];  Service: Gastroenterology;  Laterality: N/A;   SAVORY DILATION N/A 04/30/2019   Procedure: SAVORY DILATION;  Surgeon: Laurence Spates, MD;  Location: [facility];  Service: Endoscopy;  Laterality: N/A;  With fluro   SHOULDER HEMI-ARTHROPLASTY Right 06/14/2018   Procedure: RIGHT  REVERSE TOTAL SHOULDER OPEN POLY EXCHANGE;  Surgeon: Netta Cedars, MD;  Location: [facility];  Service: Orthopedics;  Laterality: Right;   SUBMUCOSAL INJECTION  06/12/2019   Procedure: SUBMUCOSAL INJECTION;  Surgeon: Ronnette Juniper, MD;  [facility];  Service: Gastroenterology;;   TEE WITHOUT CARDIOVERSION N/A 01/29/2014   Procedure: TRANSESOPHAGEAL ECHOCARDIOGRAM (TEE);  Surgeon: Sueanne Margarita, MD;  Location[facility];  Service: Cardiovascular;  Laterality: N/A;   TEE WITHOUT CARDIOVERSION N/A 04/02/2019   Procedure: TRANSESOPHAGEAL ECHOCARDIOGRAM (TEE);  Surgeon: Josue Hector, MD;  Location: [facility];  Service: Cardiovascular;  Laterality: N/A;   TEE WITHOUT CARDIOVERSION N/A 09/25/2019   Procedure: TRANSESOPHAGEAL ECHOCARDIOGRAM (TEE);  Surgeon: Jerline Pain, MD;  Location: [facility]COPY;  Service: Cardiovascular;  Laterality: N/A;   TONSILLECTOMY     TRANSCATHETER AORTIC VALVE REPLACEMENT, TRANSFEMORAL  04/15/2019   TRANSCATHETER AORTIC VALVE REPLACEMENT, TRANSFEMORAL N/A 04/15/2019   Procedure: TRANSCATHETER AORTIC VALVE REPLACEMENT, TRANSFEMORAL with POST BALLOON DILATION;  Surgeon: Sherren Mocha, MD;  Location: [facility];  Service: Open Heart Surgery;  Laterality: N/A;    SOCIAL HISTORY: Social  History   Socioeconomic History   Marital status: Widowed    Spouse name: Not on file   Number of children: 2   Years of education: Not on file   Highest education level: Not on file  Occupational History   Occupation: Retired Market researcher at [employer] Use   Smoking status: Former    Packs/day: 0.50    Years: 10.00    Pack years: 5.00    Types: Cigarettes    Start date: 01/16/1974    Quit date: 09/05/1983    Years since quitting: 38.4   Smokeless tobacco: Former    Types: Nurse, children's Use: Never used  Substance and Sexual Activity   Alcohol use: Not Currently    Alcohol/week: 0.0 standard drinks   Drug use: No   Sexual activity: Not on file  Other Topics Concern   Not on file  Social History Narrative   Not on file   Social Determinants of Health   Financial Resource Strain: Not on file  Food Insecurity: Not on file  Transportation Needs: Not on file  Physical Activity: Not on file  Stress: Not on file  Social  Connections: Not on file  Intimate Partner Violence: Not on file    FAMILY HISTORY: Family History  Problem Relation Age of Onset   Other Mother        NO HEALTH PROBLEMS   Heart disease Father    Arthritis Father    Heart Problems Sister        RELATED TO A MVA   Suicidality Brother    Other Sister        3 SISTERS IN GOOD HEALTH   Other Brother        2 BROTHERS IN GOOD HEALTH   Other Daughter        2 IN GOOD HEALTH    Review of Systems  Constitutional:  Positive for fatigue. Negative for appetite change, chills, fever and unexpected weight change.  HENT:   Negative for hearing loss, lump/mass and trouble swallowing.   Eyes:  Negative for eye problems and icterus.  Respiratory:  Negative for chest tightness, cough and shortness of breath.   Cardiovascular:  Positive for palpitations. Negative for chest pain and leg swelling.  Gastrointestinal:  Negative for abdominal distention, abdominal pain, constipation, diarrhea, nausea and  vomiting.  Endocrine: Negative for hot flashes.  Genitourinary:  Negative for difficulty urinating.   Musculoskeletal:  Negative for arthralgias.  Skin:  Negative for itching and rash.  Neurological:  Positive for light-headedness. Negative for dizziness, extremity weakness, headaches and numbness.  Hematological:  Negative for adenopathy. Does not bruise/bleed easily.  Psychiatric/Behavioral:  Negative for depression. The patient is not nervous/anxious.      PHYSICAL EXAMINATION  ECOG PERFORMANCE STATUS: 1 - Symptomatic but completely ambulatory  Vitals:   02/06/22 0802  BP: 108/78  Pulse: (!) 129  Resp: 18  Temp: (!) 97.5 F (36.4 C)  SpO2: 100%    Physical Exam Constitutional:      General: He is not in acute distress.    Appearance: Normal appearance. He is not toxic-appearing.     Comments: The patient appears tired  HENT:     Head: Normocephalic and atraumatic.     Mouth/Throat:     Mouth: Mucous membranes are moist.     Pharynx: No oropharyngeal exudate or posterior oropharyngeal erythema.  Eyes:     General: No scleral icterus. Cardiovascular:     Rate and Rhythm: Regular rhythm. Tachycardia present.     Pulses: Normal pulses.     Heart sounds: Normal heart sounds.  Pulmonary:     Effort: Pulmonary effort is normal.     Breath sounds: Normal breath sounds.  Abdominal:     General: Abdomen is flat. Bowel sounds are normal. There is no distension.     Palpations: Abdomen is soft.     Tenderness: There is no abdominal tenderness.  Musculoskeletal:        General: No swelling.     Cervical back: Neck supple.  Lymphadenopathy:     Cervical: No cervical adenopathy.  Skin:    General: Skin is warm and dry.     Coloration: Skin is pale.     Findings: No rash.  Neurological:     General: No focal deficit present.     Mental Status: He is alert.  Psychiatric:        Mood and Affect: Mood normal.        Behavior: Behavior normal.    LABORATORY  DATA:  CBC    Component Value Date/Time   WBC 6.9 02/06/2022 0747   WBC 5.5 10/10/2019  0738   RBC 3.20 (L) 02/06/2022 0747   HGB 7.4 (L) 02/06/2022 0747   HGB 9.0 (L) 12/19/2019 0836   HGB 11.7 (L) 09/18/2014 1100   HCT 23.4 (L) 02/06/2022 0747   HCT 27.8 (L) 12/19/2019 0836   HCT 34.8 (L) 09/18/2014 1100   PLT 114 (L) 02/06/2022 0747   PLT 210 12/19/2019 0836   MCV 73.1 (L) 02/06/2022 0747   MCV 67 (L) 12/19/2019 0836   MCV 69 (L) 09/18/2014 1100   MCH 23.1 (L) 02/06/2022 0747   MCHC 31.6 02/06/2022 0747   RDW 19.8 (H) 02/06/2022 0747   RDW 16.8 (H) 12/19/2019 0836   RDW 16.9 (H) 09/18/2014 1100   LYMPHSABS 0.5 (L) 02/06/2022 0747   LYMPHSABS 0.4 (L) 04/23/2019 1432   LYMPHSABS 0.6 (L) 09/18/2014 1100   MONOABS 0.4 02/06/2022 0747   EOSABS 0.2 02/06/2022 0747   EOSABS 0.5 (H) 04/23/2019 1432   EOSABS 0.0 09/18/2014 1100   BASOSABS 0.0 02/06/2022 0747   BASOSABS 0.0 04/23/2019 1432   BASOSABS 0.0 09/18/2014 1100    CMP     Component Value Date/Time   NA 142 02/06/2022 0747   NA 142 03/23/2020 0951   K 3.8 02/06/2022 0747   CL 106 02/06/2022 0747   CO2 28 02/06/2022 0747   GLUCOSE 105 (H) 02/06/2022 0747   BUN 35 (H) 02/06/2022 0747   BUN 22 03/23/2020 0951   CREATININE 1.96 (H) 02/06/2022 0747   CALCIUM 9.1 02/06/2022 0747   PROT 5.8 (L) 02/06/2022 0747   PROT 6.4 02/09/2020 1353   ALBUMIN 3.7 02/06/2022 0747   ALBUMIN 4.4 02/09/2020 1353   AST 22 02/06/2022 0747   ALT 10 02/06/2022 0747   ALKPHOS 48 02/06/2022 0747   BILITOT 0.5 02/06/2022 0747   GFRNONAA 33 (L) 02/06/2022 0747   GFRAA 41 (L) 03/31/2020 1057       ASSESSMENT and THERAPY PLAN:   Other pancytopenia ([place]) Hemoglobin electrophoresis: Beta thalassemia minor.  This is the cause of microcytosis.  It is not iron deficiency related.     Hospitalization 09/22/2019-09/25/2019: A. fib with RVR status post cardioversion, currently on Eliquis   Joshua Hudson receives blood transfusions intermittently  as well as Retacrit 40,000 units every 3 weeks beginning August 09, 2020.  Today his hemoglobin is 7.4.  He is very symptomatic.  Considering the level of his symptoms would consider raising the threshold for transfusion to less than 8.5 accompanied with symptoms.  He will receive 2 units of packed red blood cells.  His tachycardia is related to the volume decreased from his anemia.  He will return in 3 weeks for lab, f/u, and his injection.  We will get a ferritin on him today to ensure that it is at an adequate level since he is receiving an erythrocyte stimulating agent.  I also added on labs and possible transfusion in 4 weeks and in 5 weeks to avoid the lows that he feels when he is at 6 weeks with his anemia.    All questions were answered. The patient knows to call the clinic with any problems, questions or concerns. We can certainly see the patient much sooner if necessary.  Total encounter time:20 minutes*in face-to-face visit time, chart review, lab review, care coordination, order entry, and documentation of the encounter time.    Wilber Bihari, NP 02/06/22 8:36 AM Medical Oncology and Hematology [facility] [place], [place] [address] Tel. [phone]    Fax. [phone]  *  Total Encounter Time as defined by the Centers for Medicare and Medicaid Services includes, in addition to the face-to-face time of a patient visit (documented in the note above) non-face-to-face time: obtaining and reviewing outside history, ordering and reviewing medications, tests or procedures, care coordination (communications with other health care professionals or caregivers) and documentation in the medical record.  This note was electronically signed. Scot Dock, NP 02/06/2022

## 2022-02-06 NOTE — Assessment & Plan Note (Signed)
Hemoglobin electrophoresis: Beta thalassemia minor.This is the cause of microcytosis. It is not iron deficiency related.    Hospitalization 09/22/2019-09/25/2019: A. fib with RVR status post cardioversion, currently on Eliquis  Joshua Hudson receives blood transfusions intermittently as well as Retacrit 40,000 units every 3 weeks beginning August 09, 2020.  Today his hemoglobin is 7.4.  He is very symptomatic.  Considering the level of his symptoms would consider raising the threshold for transfusion to less than 8.5 accompanied with symptoms.  He will receive 2 units of packed red blood cells.  His tachycardia is related to the volume decreased from his anemia.  He will return in 3 weeks for lab, f/u, and his injection.  We will get a ferritin on him today to ensure that it is at an adequate level since he is receiving an erythrocyte stimulating agent.  I also added on labs and possible transfusion in 4 weeks and in 5 weeks to avoid the lows that he feels when he is at 6 weeks with his anemia.

## 2022-02-06 NOTE — Progress Notes (Signed)
Pt Hgb 7.4.  Verbal orders received by provider for pt to receive 2 units PRBC's today.  Orders placed.

## 2022-02-06 NOTE — Patient Instructions (Signed)
Blood Transfusion, Adult, Care After This sheet gives you information about how to care for yourself after your procedure. Your doctor may also give you more specific instructions. If you have problems or questions, contact your doctor. What can I expect after the procedure? After the procedure, it is common to have: Bruising and soreness at the IV site. A headache. Follow these instructions at home: Insertion site care     Follow instructions from your doctor about how to take care of your insertion site. This is where an IV tube was put into your vein. Make sure you: Wash your hands with soap and water before and after you change your bandage (dressing). If you cannot use soap and water, use hand sanitizer. Change your bandage as told by your doctor. Check your insertion site every day for signs of infection. Check for: Redness, swelling, or pain. Bleeding from the site. Warmth. Pus or a bad smell. General instructions Take over-the-counter and prescription medicines only as told by your doctor. Rest as told by your doctor. Go back to your normal activities as told by your doctor. Keep all follow-up visits as told by your doctor. This is important. Contact a doctor if: You have itching or red, swollen areas of skin (hives). You feel worried or nervous (anxious). You feel weak after doing your normal activities. You have redness, swelling, warmth, or pain around the insertion site. You have blood coming from the insertion site, and the blood does not stop with pressure. You have pus or a bad smell coming from the insertion site. Get help right away if: You have signs of a serious reaction. This may be coming from an allergy or the body's defense system (immune system). Signs include: Trouble breathing or shortness of breath. Swelling of the face or feeling warm (flushed). Fever or chills. Head, chest, or back pain. Dark pee (urine) or blood in the pee. Widespread rash. Fast  heartbeat. Feeling dizzy or light-headed. You may receive your blood transfusion in an outpatient setting. If so, you will be told whom to contact to report any reactions. These symptoms may be an emergency. Do not wait to see if the symptoms will go away. Get medical help right away. Call your local emergency services (911 in the [place] Do not drive yourself to the hospital. Summary Bruising and soreness at the IV site are common. Check your insertion site every day for signs of infection. Rest as told by your doctor. Go back to your normal activities as told by your doctor. Get help right away if you have signs of a serious reaction. This information is not intended to replace advice given to you by your health care provider. Make sure you discuss any questions you have with your health care provider. Document Revised: 12/16/2020 Document Reviewed: 02/13/2019 Elsevier Patient Education  2023 Elsevier Inc.  

## 2022-02-07 ENCOUNTER — Encounter: Payer: Self-pay | Admitting: Hematology and Oncology

## 2022-02-07 ENCOUNTER — Telehealth: Payer: Self-pay | Admitting: Hematology and Oncology

## 2022-02-07 LAB — TYPE AND SCREEN
ABO/RH(D): A POS
Antibody Screen: NEGATIVE
Unit division: 0
Unit division: 0

## 2022-02-07 LAB — BPAM RBC
Blood Product Expiration Date: 202306262359
Blood Product Expiration Date: 202306262359
ISSUE DATE / TIME: 202306050924
ISSUE DATE / TIME: 202306050924
Unit Type and Rh: 6200
Unit Type and Rh: 6200

## 2022-02-07 NOTE — Addendum Note (Signed)
Addended by: Scot Dock on: 02/07/2022 04:06 PM   Modules accepted: Orders

## 2022-02-07 NOTE — Telephone Encounter (Signed)
Scheduled appointment per 6/5 los. Talked with the patients daughter [name] and she is aware of the appointments that were made.

## 2022-02-22 ENCOUNTER — Encounter (HOSPITAL_COMMUNITY): Payer: Self-pay

## 2022-02-22 ENCOUNTER — Other Ambulatory Visit: Payer: Self-pay

## 2022-02-22 ENCOUNTER — Inpatient Hospital Stay (HOSPITAL_COMMUNITY)
Admission: EM | Admit: 2022-02-22 | Discharge: 2022-02-25 | DRG: 377 | Disposition: A | Discharge status: Home / Self Care | Payer: Medicare PPO | Attending: Student | Admitting: Student

## 2022-02-22 DIAGNOSIS — Z7901 Long term (current) use of anticoagulants: Secondary | ICD-10-CM

## 2022-02-22 DIAGNOSIS — I130 Hypertensive heart and chronic kidney disease with heart failure and stage 1 through stage 4 chronic kidney disease, or unspecified chronic kidney disease: Secondary | ICD-10-CM | POA: Diagnosis present

## 2022-02-22 DIAGNOSIS — Z87442 Personal history of urinary calculi: Secondary | ICD-10-CM

## 2022-02-22 DIAGNOSIS — Z9861 Coronary angioplasty status: Secondary | ICD-10-CM

## 2022-02-22 DIAGNOSIS — Z681 Body mass index (BMI) 19 or less, adult: Secondary | ICD-10-CM | POA: Diagnosis not present

## 2022-02-22 DIAGNOSIS — N401 Enlarged prostate with lower urinary tract symptoms: Secondary | ICD-10-CM | POA: Diagnosis not present

## 2022-02-22 DIAGNOSIS — R7989 Other specified abnormal findings of blood chemistry: Secondary | ICD-10-CM | POA: Diagnosis present

## 2022-02-22 DIAGNOSIS — D61818 Other pancytopenia: Secondary | ICD-10-CM

## 2022-02-22 DIAGNOSIS — E039 Hypothyroidism, unspecified: Secondary | ICD-10-CM

## 2022-02-22 DIAGNOSIS — I5042 Chronic combined systolic (congestive) and diastolic (congestive) heart failure: Secondary | ICD-10-CM | POA: Diagnosis not present

## 2022-02-22 DIAGNOSIS — Z8249 Family history of ischemic heart disease and other diseases of the circulatory system: Secondary | ICD-10-CM

## 2022-02-22 DIAGNOSIS — R778 Other specified abnormalities of plasma proteins: Secondary | ICD-10-CM | POA: Diagnosis present

## 2022-02-22 DIAGNOSIS — K449 Diaphragmatic hernia without obstruction or gangrene: Secondary | ICD-10-CM | POA: Diagnosis present

## 2022-02-22 DIAGNOSIS — M069 Rheumatoid arthritis, unspecified: Secondary | ICD-10-CM | POA: Diagnosis present

## 2022-02-22 DIAGNOSIS — D638 Anemia in other chronic diseases classified elsewhere: Secondary | ICD-10-CM | POA: Diagnosis not present

## 2022-02-22 DIAGNOSIS — Z87891 Personal history of nicotine dependence: Secondary | ICD-10-CM

## 2022-02-22 DIAGNOSIS — Z9841 Cataract extraction status, right eye: Secondary | ICD-10-CM

## 2022-02-22 DIAGNOSIS — Z86010 Personal history of colonic polyps: Secondary | ICD-10-CM | POA: Diagnosis not present

## 2022-02-22 DIAGNOSIS — K254 Chronic or unspecified gastric ulcer with hemorrhage: Principal | ICD-10-CM | POA: Diagnosis present

## 2022-02-22 DIAGNOSIS — K219 Gastro-esophageal reflux disease without esophagitis: Secondary | ICD-10-CM | POA: Diagnosis present

## 2022-02-22 DIAGNOSIS — Z961 Presence of intraocular lens: Secondary | ICD-10-CM | POA: Diagnosis present

## 2022-02-22 DIAGNOSIS — R5381 Other malaise: Secondary | ICD-10-CM

## 2022-02-22 DIAGNOSIS — Z7952 Long term (current) use of systemic steroids: Secondary | ICD-10-CM

## 2022-02-22 DIAGNOSIS — D631 Anemia in chronic kidney disease: Secondary | ICD-10-CM | POA: Diagnosis present

## 2022-02-22 DIAGNOSIS — K922 Gastrointestinal hemorrhage, unspecified: Secondary | ICD-10-CM | POA: Diagnosis not present

## 2022-02-22 DIAGNOSIS — D62 Acute posthemorrhagic anemia: Secondary | ICD-10-CM | POA: Diagnosis not present

## 2022-02-22 DIAGNOSIS — K921 Melena: Secondary | ICD-10-CM | POA: Diagnosis not present

## 2022-02-22 DIAGNOSIS — I10 Essential (primary) hypertension: Secondary | ICD-10-CM | POA: Diagnosis present

## 2022-02-22 DIAGNOSIS — Z953 Presence of xenogenic heart valve: Secondary | ICD-10-CM

## 2022-02-22 DIAGNOSIS — K224 Dyskinesia of esophagus: Secondary | ICD-10-CM | POA: Diagnosis present

## 2022-02-22 DIAGNOSIS — I509 Heart failure, unspecified: Secondary | ICD-10-CM | POA: Diagnosis not present

## 2022-02-22 DIAGNOSIS — X58XXXA Exposure to other specified factors, initial encounter: Secondary | ICD-10-CM | POA: Diagnosis present

## 2022-02-22 DIAGNOSIS — R64 Cachexia: Secondary | ICD-10-CM | POA: Diagnosis not present

## 2022-02-22 DIAGNOSIS — N1832 Chronic kidney disease, stage 3b: Secondary | ICD-10-CM | POA: Diagnosis present

## 2022-02-22 DIAGNOSIS — T182XXA Foreign body in stomach, initial encounter: Secondary | ICD-10-CM | POA: Diagnosis present

## 2022-02-22 DIAGNOSIS — Z8673 Personal history of transient ischemic attack (TIA), and cerebral infarction without residual deficits: Secondary | ICD-10-CM

## 2022-02-22 DIAGNOSIS — E43 Unspecified severe protein-calorie malnutrition: Secondary | ICD-10-CM | POA: Diagnosis present

## 2022-02-22 DIAGNOSIS — R600 Localized edema: Secondary | ICD-10-CM | POA: Diagnosis not present

## 2022-02-22 DIAGNOSIS — I480 Paroxysmal atrial fibrillation: Secondary | ICD-10-CM | POA: Diagnosis not present

## 2022-02-22 DIAGNOSIS — I248 Other forms of acute ischemic heart disease: Secondary | ICD-10-CM | POA: Diagnosis present

## 2022-02-22 DIAGNOSIS — I4891 Unspecified atrial fibrillation: Secondary | ICD-10-CM | POA: Diagnosis not present

## 2022-02-22 DIAGNOSIS — Z79890 Hormone replacement therapy (postmenopausal): Secondary | ICD-10-CM

## 2022-02-22 DIAGNOSIS — Z79899 Other long term (current) drug therapy: Secondary | ICD-10-CM

## 2022-02-22 DIAGNOSIS — R5383 Other fatigue: Secondary | ICD-10-CM

## 2022-02-22 DIAGNOSIS — Z96611 Presence of right artificial shoulder joint: Secondary | ICD-10-CM | POA: Diagnosis present

## 2022-02-22 DIAGNOSIS — E876 Hypokalemia: Secondary | ICD-10-CM

## 2022-02-22 DIAGNOSIS — R7402 Elevation of levels of lactic acid dehydrogenase (LDH): Secondary | ICD-10-CM | POA: Diagnosis present

## 2022-02-22 DIAGNOSIS — Z8261 Family history of arthritis: Secondary | ICD-10-CM

## 2022-02-22 DIAGNOSIS — Z66 Do not resuscitate: Secondary | ICD-10-CM | POA: Diagnosis present

## 2022-02-22 DIAGNOSIS — D649 Anemia, unspecified: Secondary | ICD-10-CM | POA: Diagnosis not present

## 2022-02-22 DIAGNOSIS — K3189 Other diseases of stomach and duodenum: Secondary | ICD-10-CM | POA: Diagnosis not present

## 2022-02-22 DIAGNOSIS — D696 Thrombocytopenia, unspecified: Secondary | ICD-10-CM | POA: Diagnosis present

## 2022-02-22 DIAGNOSIS — R310 Gross hematuria: Secondary | ICD-10-CM | POA: Diagnosis not present

## 2022-02-22 DIAGNOSIS — R3912 Poor urinary stream: Secondary | ICD-10-CM | POA: Diagnosis not present

## 2022-02-22 DIAGNOSIS — I110 Hypertensive heart disease with heart failure: Secondary | ICD-10-CM | POA: Diagnosis not present

## 2022-02-22 DIAGNOSIS — N400 Enlarged prostate without lower urinary tract symptoms: Secondary | ICD-10-CM | POA: Diagnosis present

## 2022-02-22 DIAGNOSIS — I5032 Chronic diastolic (congestive) heart failure: Secondary | ICD-10-CM

## 2022-02-22 DIAGNOSIS — Z9842 Cataract extraction status, left eye: Secondary | ICD-10-CM

## 2022-02-22 LAB — HEMOGLOBIN AND HEMATOCRIT, BLOOD
HCT: 28.3 % — ABNORMAL LOW (ref 39.0–52.0)
HCT: 27.2 % — ABNORMAL LOW (ref 39.0–52.0)
Hemoglobin: 8.4 g/dL — ABNORMAL LOW (ref 13.0–17.0)
Hemoglobin: 8.4 g/dL — ABNORMAL LOW (ref 13.0–17.0)

## 2022-02-22 LAB — TYPE AND SCREEN
ABO/RH(D): A POS
Antibody Screen: NEGATIVE

## 2022-02-22 LAB — TROPONIN I (HIGH SENSITIVITY)
Troponin I (High Sensitivity): 77 ng/L — ABNORMAL HIGH (ref <18)
Troponin I (High Sensitivity): 69 ng/L — ABNORMAL HIGH (ref <18)

## 2022-02-22 MED ORDER — KETOTIFEN FUMARATE 0.025 % OP SOLN
1.0000 [drp] | Freq: Two times a day (BID) | OPHTHALMIC | Status: DC | PRN
Start: 2022-02-22 — End: 2022-02-25
  Filled 2022-02-22: qty 5

## 2022-02-22 MED ORDER — SODIUM CHLORIDE 0.9 % IV SOLN: Freq: Once | INTRAVENOUS | Status: AC

## 2022-02-22 MED ORDER — TORSEMIDE 20 MG PO TABS
40.0000 mg | ORAL_TABLET | Freq: Every day | ORAL | Status: DC
  Administered 2022-02-23: 40 mg via ORAL
  Filled 2022-02-22: qty 2

## 2022-02-22 MED ORDER — PANTOPRAZOLE SODIUM 40 MG IV SOLR
40.0000 mg | Freq: Once | INTRAVENOUS | Status: AC
  Administered 2022-02-22: 40 mg via INTRAVENOUS
  Filled 2022-02-22: qty 10

## 2022-02-22 MED ORDER — PREDNISONE 5 MG PO TABS
5.0000 mg | ORAL_TABLET | Freq: Every day | ORAL | Status: DC
  Administered 2022-02-23 – 2022-02-25 (×3): 5 mg via ORAL
  Filled 2022-02-22 (×3): qty 1

## 2022-02-22 MED ORDER — AMIODARONE HCL 200 MG PO TABS
200.0000 mg | ORAL_TABLET | Freq: Every day | ORAL | Status: DC
  Administered 2022-02-23 – 2022-02-25 (×3): 200 mg via ORAL
  Filled 2022-02-22 (×3): qty 1

## 2022-02-22 MED ORDER — FOLIC ACID 1 MG PO TABS
1.0000 mg | ORAL_TABLET | Freq: Every day | ORAL | Status: DC
  Administered 2022-02-23 – 2022-02-25 (×3): 1 mg via ORAL
  Filled 2022-02-22 (×3): qty 1

## 2022-02-22 MED ORDER — ACETAMINOPHEN 325 MG PO TABS
650.0000 mg | ORAL_TABLET | Freq: Four times a day (QID) | ORAL | Status: DC | PRN
  Administered 2022-02-23: 650 mg via ORAL
  Filled 2022-02-22: qty 2

## 2022-02-22 MED ORDER — LEFLUNOMIDE 20 MG PO TABS
20.0000 mg | ORAL_TABLET | Freq: Every day | ORAL | Status: DC
  Administered 2022-02-23 – 2022-02-25 (×3): 20 mg via ORAL
  Filled 2022-02-22 (×3): qty 1

## 2022-02-22 MED ORDER — ACETAMINOPHEN 650 MG RE SUPP: 650.0000 mg | Freq: Four times a day (QID) | RECTAL | Status: DC | PRN

## 2022-02-22 MED ORDER — ONDANSETRON HCL 4 MG PO TABS: 4.0000 mg | ORAL_TABLET | Freq: Four times a day (QID) | ORAL | Status: DC | PRN

## 2022-02-22 MED ORDER — ADULT MULTIVITAMIN W/MINERALS CH
1.0000 | ORAL_TABLET | Freq: Every day | ORAL | Status: DC
  Administered 2022-02-23 – 2022-02-25 (×3): 1 via ORAL
  Filled 2022-02-22 (×3): qty 1

## 2022-02-22 MED ORDER — PANTOPRAZOLE SODIUM 40 MG IV SOLR
40.0000 mg | Freq: Two times a day (BID) | INTRAVENOUS | Status: DC
  Administered 2022-02-22 – 2022-02-24 (×4): 40 mg via INTRAVENOUS
  Filled 2022-02-22 (×4): qty 10

## 2022-02-22 MED ORDER — ONDANSETRON HCL 4 MG/2ML IJ SOLN: 4.0000 mg | Freq: Four times a day (QID) | INTRAMUSCULAR | Status: DC | PRN

## 2022-02-22 MED ORDER — LEVOTHYROXINE SODIUM 88 MCG PO TABS
88.0000 ug | ORAL_TABLET | Freq: Every morning | ORAL | Status: DC
  Administered 2022-02-23 – 2022-02-25 (×2): 88 ug via ORAL
  Filled 2022-02-22 (×2): qty 1

## 2022-02-22 MED ORDER — FINASTERIDE 5 MG PO TABS: 5.0000 mg | ORAL_TABLET | Freq: Every day | ORAL | Status: DC | PRN

## 2022-02-22 NOTE — Plan of Care (Signed)

## 2022-02-22 NOTE — ED Notes (Signed)
Pt ambulated to bathroom with wife.

## 2022-02-22 NOTE — ED Triage Notes (Signed)
Pt reports bloody diarrhea x2 weeks. Pt was sent here today due to elevated creatinine at 2.24 and concern for internal bleeding. Pt also endorses weakness. Denies abdominal pain, N/V.

## 2022-02-22 NOTE — ED Provider Notes (Signed)
[facility] DEPT Provider Note   CSN: [id number] Arrival date & time: 02/22/22  1049     History  Chief Complaint  Patient presents with   Rectal Bleeding   Abnormal Lab    Joshua Hudson is a 87 y.o. male.  Patient is an 87 year old male with multiple medical problems including atrial fibrillation on Eliquis, severe aortic stenosis status post valve replacement, RA, CHF, recurrent anemia and a history of pancytopenia who gets blood transfusions every 6 weeks, Epogen on off weeks and is about ready to start iron therapy who is presenting today from his PCPs office for GI bleeding.  Patient denies prior history of GI bleeding except after a complication with a colonoscopy greater than 10 years ago.  He has been eating and drinking normally but reports he has had black tarry sticky stools for the last 2 weeks.  He reports up to 5 episodes per day but they do not always have a lot of volume.  He had gone to urology today for routine follow-up and they did a Hemoccult there that was positive and then went to Dr. Delene Ruffini office where he did lab work and a Hemoccult.  The Hemoccult there was reported as heme positive.  Patient does take pantoprazole daily and denies any aspirin or ibuprofen use.  He has been feeling generally more fatigued and short of breath with moving around but denies any symptoms at rest.  The history is provided by the patient, medical records and a relative.  Rectal Bleeding Abnormal Lab      Home Medications Prior to Admission medications   Medication Sig Start Date End Date Taking? Authorizing Provider  amiodarone (PACERONE) 200 MG tablet 200 mg daily EXCEPT on Sunday 10/03/21   Evans Lance, MD  ELIQUIS 2.5 MG TABS tablet TAKE 1 TABLET(2.5 MG) BY MOUTH TWICE DAILY 09/19/21   Evans Lance, MD  finasteride (PROSCAR) 5 MG tablet Take 5 mg by mouth daily as needed (retention).  06/26/19   [provider]  folic acid  (FOLVITE) 1 MG tablet Take 1 mg by mouth daily.    [provider]  furosemide (LASIX) 40 MG tablet Take 1 tablet (40 mg total) by mouth daily as needed. Pt needs to make appt with provider for further refills - 2nd attempt 07/05/21   Sherren Mocha, MD  leflunomide (ARAVA) 20 MG tablet Take 20 mg by mouth daily.    [provider]  levothyroxine (SYNTHROID) 75 MCG tablet 1 tablet in the morning on an empty stomach    [provider]  Metoprolol Succinate 25 MG CS24 25 mg.    [provider]  Multiple Vitamin (MULTIVITAMIN WITH MINERALS) TABS tablet Take 1 tablet by mouth daily.    [provider]  pantoprazole (PROTONIX) 40 MG tablet Take 40 mg by mouth daily before breakfast.     [provider]  Polyethyl Glycol-Propyl Glycol (LUBRICANT EYE DROPS) 0.4-0.3 % SOLN Place 1 drop into both eyes 3 (three) times daily as needed (dry/irritated eyes.).    [provider]  potassium chloride SA (KLOR-CON) 20 MEQ tablet Take 1 tablet (20 mEq total) by mouth daily. NEED OV. 03/24/21   Eileen Stanford, PA-C  predniSONE (DELTASONE) 5 MG tablet Take 5 mg by mouth daily with breakfast.     [provider]  torsemide (DEMADEX) 20 MG tablet 1 tablet 12/31/20   [provider]  traMADol (ULTRAM) 50 MG tablet Take 50 mg  by mouth every 6 (six) hours as needed.    [provider]      Allergies    Patient has no known allergies.    Review of Systems   Review of Systems  Gastrointestinal:  Positive for hematochezia.    Physical Exam Updated Vital Signs BP 119/81   Pulse 79   Temp 97.6 F (36.4 C) (Oral)   Resp 18   Ht '5\' 3"'$  (1.6 m)   Wt 49.9 kg   SpO2 100%   BMI 19.49 kg/m  Physical Exam Vitals and nursing note reviewed.  Constitutional:      General: He is not in acute distress.    Appearance: He is well-developed.  HENT:     Head: Normocephalic and atraumatic.  Eyes:     General: Scleral icterus  present.     Conjunctiva/sclera: Conjunctivae normal.     Pupils: Pupils are equal, round, and reactive to light.  Cardiovascular:     Rate and Rhythm: Normal rate and regular rhythm.     Heart sounds: No murmur heard. Pulmonary:     Effort: Pulmonary effort is normal. No respiratory distress.     Breath sounds: Normal breath sounds. No wheezing or rales.  Abdominal:     General: There is no distension.     Palpations: Abdomen is soft.     Tenderness: There is no abdominal tenderness. There is no guarding or rebound.  Musculoskeletal:        General: No tenderness. Normal range of motion.     Cervical back: Normal range of motion and neck supple.     Right lower leg: Edema present.     Left lower leg: Edema present.     Comments: 3+ pitting edema in bilateral ankles  Skin:    General: Skin is warm and dry.     Coloration: Skin is pale.     Findings: No erythema or rash.  Neurological:     Mental Status: He is alert and oriented to person, place, and time.  Psychiatric:        Mood and Affect: Mood normal.        Behavior: Behavior normal.     ED Results / Procedures / Treatments   Labs (all labs ordered are listed, but only abnormal results are displayed) Labs Reviewed  TYPE AND SCREEN     EKG None  Radiology No results found.  Procedures Procedures    Medications Ordered in ED Medications  pantoprazole (PROTONIX) injection 40 mg (40 mg Intravenous Given 02/22/22 1149)    ED Course/ Medical Decision Making/ A&P                           Medical Decision Making Amount and/or Complexity of Data Reviewed Labs: ordered.  Risk Prescription drug management.   Pt with multiple medical problems and comorbidities and presenting today with a complaint that caries a high risk for morbidity and mortality.  Presenting today with concern for upper GI bleeding.  He has no prior history of this but does take Eliquis and does have a problem with pancytopenia that  requires every 6 week blood transfusions.  Last transfusion was on 02/06/2022.  Patient had 2 Hemoccults done prior to arrival at urology and at his PCPs office which is documented as being positive and this was deferred at this time.  He has no abdominal pain on exam.  Patient given IV Protonix.  Patient had  labs done at his doctor's office just prior to arrival today which a photo was inserted in the chart.  CBC with a hemoglobin of 9.2 which seems to be his baseline as it typically will drop down to 7 and then he gets a transfusion.  It was 7.4 prior to his transfusion on the fifth, normal platelet count of 177 and a white blood cell count of 11.6.  CMP today with worsening renal function with a creatinine of 2.24 from his baseline of 1.7-1.9 earlier this month and an elevated BUN of 34 which seems to be baseline.  Sodium and potassium were normal today with a normal anion gap.  Type and cross were done.  Will consult with GI. 12:07 PM Consulted the hospitalist for admission.  [facility] will see.         Final Clinical Impression(s) / ED Diagnoses Final diagnoses:  Upper GI bleeding  Pancytopenia ([place])  Anticoagulated    Rx / DC Orders ED Discharge Orders     None         Blanchie Dessert, MD 02/22/22 1207

## 2022-02-22 NOTE — Consult Note (Signed)
Referring Provider: [facility] Primary Care Physician:  Lavone Orn, MD Primary Gastroenterologist:  Dr. Therisa Doyne  Reason for Consultation: Melena  HPI: Joshua Hudson is a 87 y.o. male medical history significant for CHF, A-fib (on Eliquis), esophageal stricture, TIA, pancytopenia (followed by hematology) presents for evaluation of melena.  Patient states this morning he was seen by his urologist.  His urologist subsequently sent him to his primary care.  Patient thought he was having diarrhea for the last 2 weeks but when he mentioned to his primary care that he was having black sticky stools primary care did blood work and sent him to the emergency department for further evaluation.  Positive Hemoccult at urologist and PCP.  Hgb at primary care 9.2 (baseline typically 7-9).  BUN 34, creatinine 2.24.  Patient denies abdominal pain.  Denies reflux.  Denies nausea/vomiting.  States he has never seen melena before.  States he took Pepto-Bismol yesterday.  Denies iron use, scheduled to start iron infusions on Monday.  States his last colonoscopy was more than 10 years ago and he is unsure what it showed.  States his last dose of Eliquis was 6/21 AM.  Denies NSAID use, tobacco, alcohol.  Last EGD 06/2019 done for dysphagia: Low-grade narrowing of Schatzki ring, dilated.  Esophageal exam was consistent with achalasia and injected with botulinum toxin, normal stomach, normal duodenum, no specimens collected  Esophageal manometry 11/2017 showed EGJ outflow obstruction with 1 instance of failed peristalsis  ECHO 02/2020: EF 45-50%  Past Medical History:  Diagnosis Date   Achalasia 06/12/2019   Noted on EGD   Aortic insufficiency 03/24/2019   AI of bioprosthetic AVR severe 4 plus   Chronic diastolic CHF (congestive heart failure) ([place]) 03/24/2019   Colon polyps    s/p diverticular perforation requiring 2-stage repair   Derangement of right shoulder joint    need replacing has no use of   Diverticulosis     DJD (degenerative joint disease), lumbosacral    Dysphagia    eats soft food   Esophageal stricture    GERD (gastroesophageal reflux disease)    Hemolytic anemia ([place])    History of blood transfusion given with 04-15-19 surgery   History of blood transfusion 07/18/2019   History of kidney stones    passed stones   Hypertension    Mitral regurgitation    moderate   Occipital neuralgia    Pancytopenia ([facility])    Postoperative atrial fibrillation ([place]) 05/10/2015   Prosthetic valve dysfunction    Rheumatoid arthritis ([place])    s/o long term steroids  shoulders and hands   Rotator cuff arthropathy    right   S/P aortic valve replacement with bioprosthetic valve 01/16/2008   12m Edwards Magna perimount bovine pericardial tissue valve, model 3000   S/P valve-in-valve TAVR 04/15/2019   26 mm Edwards Sapien 3 Ultra transcatheter heart valve placed via percutaneous right transfemoral approach    SBE (subacute bacterial endocarditis) prophylaxis candidate    for dental procedures   Schatzki's ring 06/12/2019   Narrowing, Noted on EGD   Severe aortic stenosis    S/P prosthetic valve replacement w 25 mm Edwards like science percardial tissue valve,[place] - 01/2008   Thrombocytopenia ([place]     Past Surgical History:  Procedure Laterality Date   A-FLUTTER ABLATION N/A 10/10/2019   Procedure: A-FLUTTER ABLATION;  Surgeon: TEvans Lance MD;  [facility];  Service: Cardiovascular;  Laterality: N/A;   APPENDECTOMY     BALLOON DILATION N/A  06/12/2019   Procedure: BALLOON DILATION;  Surgeon: Ronnette Juniper, MD;  Location: [facility];  Service: Gastroenterology;  Laterality: N/A;   BOTOX INJECTION N/A 01/22/2018   Procedure: BOTOX INJECTION;  Surgeon: Ronnette Juniper, MD;  Location: [facility];  Service: Gastroenterology;  Laterality: N/A;   CARDIAC CATHETERIZATION     09   CARDIAC VALVE REPLACEMENT  01/2008   aortic valve replacement   CARDIOVERSION N/A 09/25/2019   Procedure:  CARDIOVERSION;  Surgeon: Jerline Pain, MD;  Location: [facility];  Service: Cardiovascular;  Laterality: N/A;   CARDIOVERSION N/A 12/25/2019   Procedure: CARDIOVERSION;  Surgeon: Lelon Perla, MD;  Location: [facility] ENDOSCOPY;  Service: Cardiovascular;  Laterality: N/A;   CATARACT EXTRACTION W/ INTRAOCULAR LENS  IMPLANT, BILATERAL     COLON RESECTION     diverticulitis    CORONARY ANGIOPLASTY     dental implants     permanent   ESOPHAGEAL MANOMETRY N/A 11/07/2017   Procedure: ESOPHAGEAL MANOMETRY (EM);  Surgeon: Ronnette Juniper, MD;  Location: [facility];  Service: Gastroenterology;  Laterality: N/A;   ESOPHAGOGASTRODUODENOSCOPY (EGD) WITH PROPOFOL N/A 01/22/2018   Procedure: ESOPHAGOGASTRODUODENOSCOPY (EGD) WITH PROPOFOL;  Surgeon: Ronnette Juniper, MD;  Location: [facility];  Service: Gastroenterology;  Laterality: N/A;   ESOPHAGOGASTRODUODENOSCOPY (EGD) WITH PROPOFOL N/A 04/30/2019   Procedure: ESOPHAGOGASTRODUODENOSCOPY (EGD) WITH PROPOFOL;  Surgeon: Laurence Spates, MD;  Location: [facility];  Service: Endoscopy;  Laterality: N/A;   ESOPHAGOGASTRODUODENOSCOPY (EGD) WITH PROPOFOL N/A 06/12/2019   Procedure: ESOPHAGOGASTRODUODENOSCOPY (EGD) WITH PROPOFOL;  Surgeon: Ronnette Juniper, MD;  [facility];  Service: Gastroenterology;  Laterality: N/A;  with botox injection   EXCISIONAL TOTAL SHOULDER ARTHROPLASTY WITH ANTIBIOTIC SPACER Right 12/31/2018   Procedure: EXCISIONAL TOTAL SHOULDER ARTHROPLASTY WITH ANTIBIOTIC SPACER;  Surgeon: Netta Cedars, MD;  Location: [place];  Service: Orthopedics;  Laterality: Right;   HERNIA REPAIR     INTRAOPERATIVE TRANSTHORACIC ECHOCARDIOGRAM N/A 04/15/2019   Procedure: Intraoperative Transthoracic Echocardiogram;  Surgeon: Sherren Mocha, MD;  Location: [place];  Service: Open Heart Surgery;  Laterality: N/A;   IRRIGATION AND DEBRIDEMENT SHOULDER Right 11/20/2018    IRRIGATION AND DEBRIDEMENT SHOULDER WITH POLY EXCHANGE (Right Shoulder)   IRRIGATION AND  DEBRIDEMENT SHOULDER Right 11/20/2018   Procedure: IRRIGATION AND DEBRIDEMENT SHOULDER WITH POLY EXCHANGE;  Surgeon: Netta Cedars, MD;  Location: [place];  Service: Orthopedics;  Laterality: Right;   LUMBAR LAMINECTOMY     x 2   REVERSE SHOULDER ARTHROPLASTY Right 03/01/2018   Procedure: RIGHT REVERSE SHOULDER ARTHROPLASTY;  Surgeon: Netta Cedars, MD;  Location: [place];  Service: Orthopedics;  Laterality: Right;   REVERSE SHOULDER ARTHROPLASTY Right 07/18/2019   Procedure: REVERSE TOTAL SHOULDER ARTHROPLASTY and removal of antiobotic spacer;  Surgeon: Netta Cedars, MD;  [facility];  Service: Orthopedics;  Laterality: Right;  interscalene block   REVERSE SHOULDER ARTHROPLASTY Right 08/06/2019   Procedure: Reduction of dislocated reverse total shoulder and poly exchange;  Surgeon: Netta Cedars, MD;  Location: [facility];  Service: Orthopedics;  Laterality: Right;  need 1 hour   RIGHT/LEFT HEART CATH AND CORONARY ANGIOGRAPHY N/A 04/02/2019   Procedure: RIGHT/LEFT HEART CATH AND CORONARY ANGIOGRAPHY;  Surgeon: Leonie Man, MD;  Location: [facility];  Service: Cardiovascular;  Laterality: N/A;   SAVORY DILATION N/A 01/22/2018   Procedure: SAVORY DILATION;  Surgeon: Ronnette Juniper, MD;  [facility];  Service: Gastroenterology;  Laterality: N/A;   SAVORY DILATION N/A 04/30/2019   Procedure: SAVORY DILATION;  Surgeon: Laurence Spates, MD;  Location: [place];  Service: Endoscopy;  Laterality: N/A;  With fluro   SHOULDER HEMI-ARTHROPLASTY Right 06/14/2018   Procedure: RIGHT  REVERSE TOTAL SHOULDER OPEN POLY EXCHANGE;  Surgeon: Netta Cedars, MD;  Location: [place];  Service: Orthopedics;  Laterality: Right;   SUBMUCOSAL INJECTION  06/12/2019   Procedure: SUBMUCOSAL INJECTION;  Surgeon: Ronnette Juniper, MD;  Location[facility];  Service: Gastroenterology;;   TEE WITHOUT CARDIOVERSION N/A 01/29/2014   Procedure: TRANSESOPHAGEAL ECHOCARDIOGRAM (TEE);  Surgeon: Sueanne Margarita, MD;  Location:  [facility] ENDOSCOPY;  Service: Cardiovascular;  Laterality: N/A;   TEE WITHOUT CARDIOVERSION N/A 04/02/2019   Procedure: TRANSESOPHAGEAL ECHOCARDIOGRAM (TEE);  Surgeon: Josue Hector, MD;  Location[facility];  Service: Cardiovascular;  Laterality: N/A;   TEE WITHOUT CARDIOVERSION N/A 09/25/2019   Procedure: TRANSESOPHAGEAL ECHOCARDIOGRAM (TEE);  Surgeon: Jerline Pain, MD;  Location: [facility] ENDOSCOPY;  Service: Cardiovascular;  Laterality: N/A;   TONSILLECTOMY     TRANSCATHETER AORTIC VALVE REPLACEMENT, TRANSFEMORAL  04/15/2019   TRANSCATHETER AORTIC VALVE REPLACEMENT, TRANSFEMORAL N/A 04/15/2019   Procedure: TRANSCATHETER AORTIC VALVE REPLACEMENT, TRANSFEMORAL with POST BALLOON DILATION;  Surgeon: Sherren Mocha, MD;  Location: [place];  Service: Open Heart Surgery;  Laterality: N/A;    Prior to Admission medications   Medication Sig Start Date End Date Taking? Authorizing Provider  amiodarone (PACERONE) 200 MG tablet 200 mg daily EXCEPT on Sunday 10/03/21   Evans Lance, MD  ELIQUIS 2.5 MG TABS tablet TAKE 1 TABLET(2.5 MG) BY MOUTH TWICE DAILY 09/19/21   Evans Lance, MD  finasteride (PROSCAR) 5 MG tablet Take 5 mg by mouth daily as needed (retention).  06/26/19   [provider]  folic acid (FOLVITE) 1 MG tablet Take 1 mg by mouth daily.    [provider]  furosemide (LASIX) 40 MG tablet Take 1 tablet (40 mg total) by mouth daily as needed. Pt needs to make appt with provider for further refills - 2nd attempt 07/05/21   Sherren Mocha, MD  leflunomide (ARAVA) 20 MG tablet Take 20 mg by mouth daily.    [provider]  levothyroxine (SYNTHROID) 75 MCG tablet 1 tablet in the morning on an empty stomach    [provider]  Metoprolol Succinate 25 MG CS24 25 mg.    [provider]  Multiple Vitamin (MULTIVITAMIN WITH MINERALS) TABS tablet Take 1 tablet by mouth daily.    [provider]  pantoprazole (PROTONIX) 40 MG tablet Take 40 mg by mouth  daily before breakfast.     [provider]  Polyethyl Glycol-Propyl Glycol (LUBRICANT EYE DROPS) 0.4-0.3 % SOLN Place 1 drop into both eyes 3 (three) times daily as needed (dry/irritated eyes.).    [provider]  potassium chloride SA (KLOR-CON) 20 MEQ tablet Take 1 tablet (20 mEq total) by mouth daily. NEED OV. 03/24/21   Eileen Stanford, PA-C  predniSONE (DELTASONE) 5 MG tablet Take 5 mg by mouth daily with breakfast.     [provider]  torsemide (DEMADEX) 20 MG tablet 1 tablet 12/31/20   [provider]  traMADol (ULTRAM) 50 MG tablet Take 50 mg by mouth every 6 (six) hours as needed.    [provider]    Scheduled Meds:  pantoprazole (PROTONIX) IV  40 mg Intravenous Once   Continuous Infusions: PRN Meds:.  Allergies as of 02/22/2022   (No Known Allergies)    Family History  Problem Relation Age of Onset   Other Mother        NO  HEALTH PROBLEMS   Heart disease Father    Arthritis Father    Heart Problems Sister        RELATED TO A MVA   Suicidality Brother    Other Sister        3 SISTERS IN GOOD HEALTH   Other Brother        2 BROTHERS IN GOOD HEALTH   Other Daughter        2 IN GOOD HEALTH    Social History   Socioeconomic History   Marital status: Widowed    Spouse name: Not on file   Number of children: 2   Years of education: Not on file   Highest education level: Not on file  Occupational History   Occupation: Retired Market researcher at [employer] Use   Smoking status: Former    Packs/day: 0.50    Years: 10.00    Total pack years: 5.00    Types: Cigarettes    Start date: 01/16/1974    Quit date: 09/05/1983    Years since quitting: 38.4   Smokeless tobacco: Former    Types: Nurse, children's Use: Never used  Substance and Sexual Activity   Alcohol use: Not Currently    Alcohol/week: 0.0 standard drinks of alcohol   Drug use: No   Sexual activity: Not on file  Other Topics Concern   Not on file   Social History Narrative   Not on file   Social Determinants of Health   Financial Resource Strain: Not on file  Food Insecurity: Not on file  Transportation Needs: Not on file  Physical Activity: Not on file  Stress: Not on file  Social Connections: Not on file  Intimate Partner Violence: Not on file    Review of Systems: Review of Systems  Constitutional:  Negative for chills, fever and weight loss.  HENT:  Negative for hearing loss and tinnitus.   Eyes:  Negative for blurred vision and double vision.  Respiratory:  Negative for cough.   Cardiovascular:  Negative for chest pain and palpitations.  Gastrointestinal:  Positive for diarrhea and melena. Negative for abdominal pain, blood in stool, constipation, heartburn, nausea and vomiting.  Genitourinary:  Negative for dysuria and urgency.  Musculoskeletal:  Negative for myalgias and neck pain.  Skin:  Negative for itching and rash.  Neurological:  Negative for seizures and loss of consciousness.  Psychiatric/Behavioral:  Negative for substance abuse. The patient is not nervous/anxious.      Physical Exam:Physical Exam Constitutional:      Appearance: Normal appearance.  HENT:     Head: Normocephalic and atraumatic.     Nose: Nose normal. No congestion.     Mouth/Throat:     Mouth: Mucous membranes are moist.     Pharynx: Oropharynx is clear.  Eyes:     Extraocular Movements: Extraocular movements intact.     Conjunctiva/sclera: Conjunctivae normal.  Cardiovascular:     Rate and Rhythm: Normal rate and regular rhythm.  Pulmonary:     Effort: Pulmonary effort is normal. No respiratory distress.  Abdominal:     General: Abdomen is flat. Bowel sounds are normal. There is no distension.     Palpations: Abdomen is soft. There is no mass.     Tenderness: There is no abdominal tenderness. There is no guarding or rebound.     Hernia: No hernia is present.  Musculoskeletal:        General: No swelling. Normal range of  motion.     Cervical back: Normal range of motion and neck supple.  Skin:    General: Skin is warm and dry.  Neurological:     General: No focal deficit present.     Mental Status: He is alert and oriented to person, place, and time.  Psychiatric:        Mood and Affect: Mood normal.        Behavior: Behavior normal.        Thought Content: Thought content normal.        Judgment: Judgment normal.     Vital signs: Vitals:   02/22/22 1053  BP: 129/68  Pulse: 86  Resp: 18  Temp: 97.6 F (36.4 C)  SpO2: 96%        GI:  Lab Results: No results for input(s): "WBC", "HGB", "HCT", "PLT" in the last 72 hours. BMET No results for input(s): "NA", "K", "CL", "CO2", "GLUCOSE", "BUN", "CREATININE", "CALCIUM" in the last 72 hours. LFT No results for input(s): "PROT", "ALBUMIN", "AST", "ALT", "ALKPHOS", "BILITOT", "BILIDIR", "IBILI" in the last 72 hours. PT/INR No results for input(s): "LABPROT", "INR" in the last 72 hours.   Studies/Results: No results found.  Impression: Melena -CBC (from [facility] PCP today) shows Hgb 9.2, platelets 177 -CMP (from [facility] PCP today) BUN 34, Cr. 2.24  Afib on Eliquis  (Last dose this AM)  CHF - ECHO 45-50% EF 2021   Plan: Melena for 2 weeks with fatigue, on Eliquis. Hgb stable. Recommend cessation of Eliquis. Can consider EGD for further evaluation of melena. Will discuss timing of EGD with Dr. Paulita Fujita Protonix IV '40mg'$  BID Continue supportive care [facility] will follow    LOS: 0 days   Milanna Kozlov [place] Pax  PA-C 02/22/2022, 11:35 AM  Contact #  [phone]

## 2022-02-22 NOTE — H&P (Signed)
History and Physical    Patient: Joshua Hudson DQQ:[id number] DOB: 10-Oct-1934 DOA: 02/22/2022 DOS: the patient was seen and examined on 02/22/2022 PCP: Lavone Orn, MD  Patient coming from: Home  Chief Complaint:  Chief Complaint  Patient presents with   Rectal Bleeding   Abnormal Lab   HPI: Joshua Hudson is a 87 y.o. male with medical history significant of ACD, thrombocytopenia, chronic diastolic HF, GERD, HTN, RA, hypothyroidism, afib. Presenting with melena. Symptoms started 2 weeks ago. He was having "black diarrhea"; or at least what he thought was diarrhea. He didn't try any OTC meds for it. He noticed that he was lightheaded and fatigued; especially with activity. 3 days ago, he decided to call his PCP about it. He was given a prescription for diarrhea. His symptoms did not improve. He went to a regularly scheduled urology appointment this morning and was told that his labs were abnormal. He was sent to his PCP. He was found to be heme positive. It was recommended that he come to the ED for evaluation.    Review of Systems: As mentioned in the history of present illness. All other systems reviewed and are negative. Past Medical History:  Diagnosis Date   Achalasia 06/12/2019   Noted on EGD   Aortic insufficiency 03/24/2019   AI of bioprosthetic AVR severe 4 plus   Chronic diastolic CHF (congestive heart failure) ([address]) 03/24/2019   Colon polyps    s/p diverticular perforation requiring 2-stage repair   Derangement of right shoulder joint    need replacing has no use of   Diverticulosis    DJD (degenerative joint disease), lumbosacral    Dysphagia    eats soft food   Esophageal stricture    GERD (gastroesophageal reflux disease)    Hemolytic anemia ([place])    History of blood transfusion given with 04-15-19 surgery   History of blood transfusion 07/18/2019   History of kidney stones    passed stones   Hypertension    Mitral regurgitation    moderate   Occipital  neuralgia    Pancytopenia (HCC)    Postoperative atrial fibrillation ([place]) 05/10/2015   Prosthetic valve dysfunction    Rheumatoid arthritis (HCC)    s/o long term steroids  shoulders and hands   Rotator cuff arthropathy    right   S/P aortic valve replacement with bioprosthetic valve 01/16/2008   69m Edwards Magna perimount bovine pericardial tissue valve, model 3000   S/P valve-in-valve TAVR 04/15/2019   26 mm Edwards Sapien 3 Ultra transcatheter heart valve placed via percutaneous right transfemoral approach    SBE (subacute bacterial endocarditis) prophylaxis candidate    for dental procedures   Schatzki's ring 06/12/2019   Narrowing, Noted on EGD   Severe aortic stenosis    S/P prosthetic valve replacement w 25 mm Edwards like science percardial tissue valve,[place] - 01/2008   Thrombocytopenia ([place]    Past Surgical History:  Procedure Laterality Date   A-FLUTTER ABLATION N/A 10/10/2019   Procedure: A-FLUTTER ABLATION;  Surgeon: TEvans Lance MD;  [facility];  Service: Cardiovascular;  Laterality: N/A;   APPENDECTOMY     BALLOON DILATION N/A 06/12/2019   Procedure: BALLOON DILATION;  Surgeon: KRonnette Juniper MD;  [facility];  Service: Gastroenterology;  Laterality: N/A;   BOTOX INJECTION N/A 01/22/2018   Procedure: BOTOX INJECTION;  Surgeon: KRonnette Juniper MD;  [facility];  Service: Gastroenterology;  Laterality: N/A;   CARDIAC CATHETERIZATION  09   CARDIAC VALVE REPLACEMENT  01/2008   aortic valve replacement   CARDIOVERSION N/A 09/25/2019   Procedure: CARDIOVERSION;  Surgeon: Jerline Pain, MD;  Location: [place];  Service: Cardiovascular;  Laterality: N/A;   CARDIOVERSION N/A 12/25/2019   Procedure: CARDIOVERSION;  Surgeon: Lelon Perla, MD;  Location: [facility] ENDOSCOPY;  Service: Cardiovascular;  Laterality: N/A;   CATARACT EXTRACTION W/ INTRAOCULAR LENS  IMPLANT, BILATERAL     COLON RESECTION     diverticulitis    CORONARY  ANGIOPLASTY     dental implants     permanent   ESOPHAGEAL MANOMETRY N/A 11/07/2017   Procedure: ESOPHAGEAL MANOMETRY (EM);  Surgeon: Ronnette Juniper, MD;  [facility];  Service: Gastroenterology;  Laterality: N/A;   ESOPHAGOGASTRODUODENOSCOPY (EGD) WITH PROPOFOL N/A 01/22/2018   Procedure: ESOPHAGOGASTRODUODENOSCOPY (EGD) WITH PROPOFOL;  Surgeon: Ronnette Juniper, MD;  [facility];  Service: Gastroenterology;  Laterality: N/A;   ESOPHAGOGASTRODUODENOSCOPY (EGD) WITH PROPOFOL N/A 04/30/2019   Procedure: ESOPHAGOGASTRODUODENOSCOPY (EGD) WITH PROPOFOL;  Surgeon: Laurence Spates, MD;  Location: [place];  Service: Endoscopy;  Laterality: N/A;   ESOPHAGOGASTRODUODENOSCOPY (EGD) WITH PROPOFOL N/A 06/12/2019   Procedure: ESOPHAGOGASTRODUODENOSCOPY (EGD) WITH PROPOFOL;  Surgeon: Ronnette Juniper, MD;  [facility];  Service: Gastroenterology;  Laterality: N/A;  with botox injection   EXCISIONAL TOTAL SHOULDER ARTHROPLASTY WITH ANTIBIOTIC SPACER Right 12/31/2018   Procedure: EXCISIONAL TOTAL SHOULDER ARTHROPLASTY WITH ANTIBIOTIC SPACER;  Surgeon: Netta Cedars, MD;  Location: [place];  Service: Orthopedics;  Laterality: Right;   HERNIA REPAIR     INTRAOPERATIVE TRANSTHORACIC ECHOCARDIOGRAM N/A 04/15/2019   Procedure: Intraoperative Transthoracic Echocardiogram;  Surgeon: Sherren Mocha, MD;  Location: [place];  Service: Open Heart Surgery;  Laterality: N/A;   IRRIGATION AND DEBRIDEMENT SHOULDER Right 11/20/2018    IRRIGATION AND DEBRIDEMENT SHOULDER WITH POLY EXCHANGE (Right Shoulder)   IRRIGATION AND DEBRIDEMENT SHOULDER Right 11/20/2018   Procedure: IRRIGATION AND DEBRIDEMENT SHOULDER WITH POLY EXCHANGE;  Surgeon: Netta Cedars, MD;  Location: [place];  Service: Orthopedics;  Laterality: Right;   LUMBAR LAMINECTOMY     x 2   REVERSE SHOULDER ARTHROPLASTY Right 03/01/2018   Procedure: RIGHT REVERSE SHOULDER ARTHROPLASTY;  Surgeon: Netta Cedars, MD;  Location: [place];  Service: Orthopedics;   Laterality: Right;   REVERSE SHOULDER ARTHROPLASTY Right 07/18/2019   Procedure: REVERSE TOTAL SHOULDER ARTHROPLASTY and removal of antiobotic spacer;  Surgeon: Netta Cedars, MD;  [facility];  Service: Orthopedics;  Laterality: Right;  interscalene block   REVERSE SHOULDER ARTHROPLASTY Right 08/06/2019   Procedure: Reduction of dislocated reverse total shoulder and poly exchange;  Surgeon: Netta Cedars, MD;  [facility];  Service: Orthopedics;  Laterality: Right;  need 1 hour   RIGHT/LEFT HEART CATH AND CORONARY ANGIOGRAPHY N/A 04/02/2019   Procedure: RIGHT/LEFT HEART CATH AND CORONARY ANGIOGRAPHY;  Surgeon: Leonie Man, MD;  Location: [place] [facility];  Service: Cardiovascular;  Laterality: N/A;   SAVORY DILATION N/A 01/22/2018   Procedure: SAVORY DILATION;  Surgeon: Ronnette Juniper, MD;  [facility];  Service: Gastroenterology;  Laterality: N/A;   SAVORY DILATION N/A 04/30/2019   Procedure: SAVORY DILATION;  Surgeon: Laurence Spates, MD;  Location: [place];  Service: Endoscopy;  Laterality: N/A;  With fluro   SHOULDER HEMI-ARTHROPLASTY Right 06/14/2018   Procedure: RIGHT  REVERSE TOTAL SHOULDER OPEN POLY EXCHANGE;  Surgeon: Netta Cedars, MD;  Location: [place];  Service: Orthopedics;  Laterality: Right;   SUBMUCOSAL INJECTION  06/12/2019   Procedure: SUBMUCOSAL INJECTION;  Surgeon: Therisa Doyne,  Megan Salon, MD;  Location: [facility];  Service: Gastroenterology;;   TEE WITHOUT CARDIOVERSION N/A 01/29/2014   Procedure: TRANSESOPHAGEAL ECHOCARDIOGRAM (TEE);  Surgeon: Sueanne Margarita, MD;  Location: [facility] ENDOSCOPY;  Service: Cardiovascular;  Laterality: N/A;   TEE WITHOUT CARDIOVERSION N/A 04/02/2019   Procedure: TRANSESOPHAGEAL ECHOCARDIOGRAM (TEE);  Surgeon: Josue Hector, MD;  Location: [facility] ENDOSCOPY;  Service: Cardiovascular;  Laterality: N/A;   TEE WITHOUT CARDIOVERSION N/A 09/25/2019   Procedure: TRANSESOPHAGEAL ECHOCARDIOGRAM (TEE);  Surgeon: Jerline Pain, MD;  Location: [facility]  ENDOSCOPY;  Service: Cardiovascular;  Laterality: N/A;   TONSILLECTOMY     TRANSCATHETER AORTIC VALVE REPLACEMENT, TRANSFEMORAL  04/15/2019   TRANSCATHETER AORTIC VALVE REPLACEMENT, TRANSFEMORAL N/A 04/15/2019   Procedure: TRANSCATHETER AORTIC VALVE REPLACEMENT, TRANSFEMORAL with POST BALLOON DILATION;  Surgeon: Sherren Mocha, MD;  Location: [place];  Service: Open Heart Surgery;  Laterality: N/A;   Social History:  reports that he quit smoking about 38 years ago. His smoking use included cigarettes. He started smoking about 48 years ago. He has a 5.00 pack-year smoking history. He has quit using smokeless tobacco.  His smokeless tobacco use included chew. He reports that he does not currently use alcohol. He reports that he does not use drugs.  No Known Allergies  Family History  Problem Relation Age of Onset   Other Mother        NO HEALTH PROBLEMS   Heart disease Father    Arthritis Father    Heart Problems Sister        RELATED TO A MVA   Suicidality Brother    Other Sister        3 SISTERS IN GOOD HEALTH   Other Brother        2 BROTHERS IN GOOD HEALTH   Other Daughter        2 IN GOOD HEALTH    Prior to Admission medications   Medication Sig Start Date End Date Taking? Authorizing Provider  amiodarone (PACERONE) 200 MG tablet 200 mg daily EXCEPT on Sunday 10/03/21   Evans Lance, MD  ELIQUIS 2.5 MG TABS tablet TAKE 1 TABLET(2.5 MG) BY MOUTH TWICE DAILY 09/19/21   Evans Lance, MD  finasteride (PROSCAR) 5 MG tablet Take 5 mg by mouth daily as needed (retention).  06/26/19   [provider]  folic acid (FOLVITE) 1 MG tablet Take 1 mg by mouth daily.    [provider]  furosemide (LASIX) 40 MG tablet Take 1 tablet (40 mg total) by mouth daily as needed. Pt needs to make appt with provider for further refills - 2nd attempt 07/05/21   Sherren Mocha, MD  leflunomide (ARAVA) 20 MG tablet Take 20 mg by mouth daily.    [provider]  levothyroxine  (SYNTHROID) 75 MCG tablet 1 tablet in the morning on an empty stomach    [provider]  Metoprolol Succinate 25 MG CS24 25 mg.    [provider]  Multiple Vitamin (MULTIVITAMIN WITH MINERALS) TABS tablet Take 1 tablet by mouth daily.    [provider]  pantoprazole (PROTONIX) 40 MG tablet Take 40 mg by mouth daily before breakfast.     [provider]  Polyethyl Glycol-Propyl Glycol (LUBRICANT EYE DROPS) 0.4-0.3 % SOLN Place 1 drop into both eyes 3 (three) times daily as needed (dry/irritated eyes.).    [provider]  potassium chloride SA (KLOR-CON) 20 MEQ tablet Take 1 tablet (20 mEq total) by mouth daily. NEED OV. 03/24/21  Eileen Stanford, PA-C  predniSONE (DELTASONE) 5 MG tablet Take 5 mg by mouth daily with breakfast.     [provider]  torsemide (DEMADEX) 20 MG tablet 1 tablet 12/31/20   [provider]  traMADol (ULTRAM) 50 MG tablet Take 50 mg by mouth every 6 (six) hours as needed.    [provider]    Physical Exam: Vitals:   02/22/22 1053 02/22/22 1130 02/22/22 1136  BP: 129/68 119/81   Pulse: 86 71 79  Resp: 18 18   Temp: 97.6 F (36.4 C)    TempSrc: Oral    SpO2: 96% 96% 100%  Weight: 49.9 kg    Height: 5' 3" (1.6 m)     General: 87 y.o. male resting in bed in NAD Eyes: PERRL, normal sclera ENMT: Nares patent w/o discharge, orophaynx clear, dentition normal, ears w/o discharge/lesions/ulcers Neck: Supple, trachea midline Cardiovascular: RRR, +S1, S2, no m/g/r, equal pulses throughout Respiratory: CTABL, no w/r/r, normal WOB GI: BS+, NDNT, no masses noted, no organomegaly noted MSK: No e/c/c Neuro: A&O x 3, no focal deficits Psyc: Appropriate interaction and affect, calm/cooperative  Data Reviewed:  Labs are from his PCP visit this morning and are documented in the EDP note.  Na+  144 BUN  34 Scr  2.24 eGFR  28 WBC  11.6 Hgb  9.2 MCV  74.9 Plt  177   Assessment and  Plan: GIB Hx of anemia of chronic disease     - admit to inpt, tele     - q6h H&H; transfuse for Hgb < 8     - type and screen     - Hgb from outpt clinic today is 9.2 which is up from his labs on 6/5 (7.4)     - [facility] onboard appreciate assistance     - hold eliquis  A fib      - on chronic eliquis; hold for now     - continue home regimen otherwise  Chronic diastolic HF Hx of AVR     - continue home regimen  Elevated troponin     - initial troponin is 77; trend     - EKG pending  CKD 3b     - he is at baseline, watch nephrotoxoxins  GERD     - continue protonix  Hypothyroidism      - continue home regimen  RA     - continue home regimen  Advance Care Planning:   Code Status: DNR  Consults: [facility]  Family Communication: w/ wife at bedside  Severity of Illness: The appropriate patient status for this patient is INPATIENT. Inpatient status is judged to be reasonable and necessary in order to provide the required intensity of service to ensure the patient's safety. The patient's presenting symptoms, physical exam findings, and initial radiographic and laboratory data in the context of their chronic comorbidities is felt to place them at high risk for further clinical deterioration. Furthermore, it is not anticipated that the patient will be medically stable for discharge from the hospital within 2 midnights of admission.   * I certify that at the point of admission it is my clinical judgment that the patient will require inpatient hospital care spanning beyond 2 midnights from the point of admission due to high intensity of service, high risk for further deterioration and high frequency of surveillance required.*  Author: Jonnie Finner, DO 02/22/2022 12:15 PM  For on call review [email or web address].

## 2022-02-23 ENCOUNTER — Other Ambulatory Visit: Payer: Self-pay

## 2022-02-23 DIAGNOSIS — D638 Anemia in other chronic diseases classified elsewhere: Secondary | ICD-10-CM

## 2022-02-23 DIAGNOSIS — R778 Other specified abnormalities of plasma proteins: Secondary | ICD-10-CM | POA: Diagnosis not present

## 2022-02-23 DIAGNOSIS — K219 Gastro-esophageal reflux disease without esophagitis: Secondary | ICD-10-CM

## 2022-02-23 DIAGNOSIS — I480 Paroxysmal atrial fibrillation: Secondary | ICD-10-CM

## 2022-02-23 DIAGNOSIS — M069 Rheumatoid arthritis, unspecified: Secondary | ICD-10-CM

## 2022-02-23 DIAGNOSIS — N1832 Chronic kidney disease, stage 3b: Secondary | ICD-10-CM

## 2022-02-23 DIAGNOSIS — Z953 Presence of xenogenic heart valve: Secondary | ICD-10-CM

## 2022-02-23 DIAGNOSIS — K921 Melena: Secondary | ICD-10-CM | POA: Diagnosis not present

## 2022-02-23 DIAGNOSIS — E039 Hypothyroidism, unspecified: Secondary | ICD-10-CM

## 2022-02-23 DIAGNOSIS — R5381 Other malaise: Secondary | ICD-10-CM

## 2022-02-23 DIAGNOSIS — I5042 Chronic combined systolic (congestive) and diastolic (congestive) heart failure: Secondary | ICD-10-CM | POA: Diagnosis not present

## 2022-02-23 DIAGNOSIS — E43 Unspecified severe protein-calorie malnutrition: Secondary | ICD-10-CM

## 2022-02-23 LAB — CBC
HCT: 25.1 % — ABNORMAL LOW (ref 39.0–52.0)
HCT: 28.3 % — ABNORMAL LOW (ref 39.0–52.0)
Hemoglobin: 7.9 g/dL — ABNORMAL LOW (ref 13.0–17.0)
Hemoglobin: 8.8 g/dL — ABNORMAL LOW (ref 13.0–17.0)
MCH: 24.3 pg — ABNORMAL LOW (ref 26.0–34.0)
MCH: 24.3 pg — ABNORMAL LOW (ref 26.0–34.0)
MCHC: 31.5 g/dL (ref 30.0–36.0)
MCHC: 31.1 g/dL (ref 30.0–36.0)
MCV: 77.2 fL — ABNORMAL LOW (ref 80.0–100.0)
MCV: 78.2 fL — ABNORMAL LOW (ref 80.0–100.0)
Platelets: 116 10*3/uL — ABNORMAL LOW (ref 150–400)
Platelets: 131 10*3/uL — ABNORMAL LOW (ref 150–400)
RBC: 3.25 MIL/uL — ABNORMAL LOW (ref 4.22–5.81)
RBC: 3.62 MIL/uL — ABNORMAL LOW (ref 4.22–5.81)
RDW: 22.2 % — ABNORMAL HIGH (ref 11.5–15.5)
RDW: 21.5 % — ABNORMAL HIGH (ref 11.5–15.5)
WBC: 8.1 10*3/uL (ref 4.0–10.5)
WBC: 8.4 10*3/uL (ref 4.0–10.5)
nRBC: 0.4 % — ABNORMAL HIGH (ref 0.0–0.2)
nRBC: 0.4 % — ABNORMAL HIGH (ref 0.0–0.2)

## 2022-02-23 LAB — COMPREHENSIVE METABOLIC PANEL
ALT: 15 U/L (ref 0–44)
AST: 28 U/L (ref 15–41)
Albumin: 3.2 g/dL — ABNORMAL LOW (ref 3.5–5.0)
Alkaline Phosphatase: 41 U/L (ref 38–126)
Anion gap: 7 (ref 5–15)
BUN: 31 mg/dL — ABNORMAL HIGH (ref 8–23)
CO2: 24 mmol/L (ref 22–32)
Calcium: 8.4 mg/dL — ABNORMAL LOW (ref 8.9–10.3)
Chloride: 114 mmol/L — ABNORMAL HIGH (ref 98–111)
Creatinine, Ser: 1.74 mg/dL — ABNORMAL HIGH (ref 0.61–1.24)
GFR, Estimated: 38 mL/min — ABNORMAL LOW (ref >60)
Glucose, Bld: 68 mg/dL — ABNORMAL LOW (ref 70–99)
Potassium: 3.7 mmol/L (ref 3.5–5.1)
Sodium: 145 mmol/L (ref 135–145)
Total Bilirubin: 0.9 mg/dL (ref 0.3–1.2)
Total Protein: 5.5 g/dL — ABNORMAL LOW (ref 6.5–8.1)

## 2022-02-23 LAB — LACTATE DEHYDROGENASE: LDH: 251 U/L — ABNORMAL HIGH (ref 98–192)

## 2022-02-23 MED ORDER — FUROSEMIDE 40 MG PO TABS
40.0000 mg | ORAL_TABLET | ORAL | Status: DC
  Administered 2022-02-24: 40 mg via ORAL
  Filled 2022-02-23: qty 1

## 2022-02-23 NOTE — Progress Notes (Signed)
  Transition of Care (TOC) Screening Note   Patient Details  Name: Joshua Hudson Date of Birth: 1935-08-10   Transition of Care [facility]) CM/SW Contact:    Lennart Pall, LCSW Phone Number: 02/23/2022, 10:19 AM    Transition of Care Department [facility]) has reviewed patient and no TOC needs have been identified at this time. We will continue to monitor patient advancement through interdisciplinary progression rounds. If new patient transition needs arise, please place a TOC consult.

## 2022-02-23 NOTE — Progress Notes (Signed)
PROGRESS NOTE  Joshua Hudson:[id number] DOB: 1935/02/02   PCP: Lavone Orn, MD  Patient is from: Home.  Lives with his wife.  DOA: 02/22/2022 LOS: 1  Chief complaints Chief Complaint  Patient presents with   Rectal Bleeding   Abnormal Lab     Brief Narrative / Interim history: 87 year old M with PMH of paroxysmal A-fib on Eliquis, bioprosthetic AV, ACD, thrombocytopenia, CKD-3B, RA, chronic combined CHF, HTN, GERD and hypothyroidism directed to ED by urology for abnormal lab.  Reportedly had melena for about 2 weeks.  He was also lightheaded and fatigued for weeks.  He is followed by hematology.  He gets periodic blood transfusion.  On presentation, stable vitals.  Hgb 8.4 (7.4 on 6/5, baseline 8-9).  [facility] consulted.  Subjective: Seen and examined earlier this morning.  No major events overnight or this morning.  Continues to endorse melanotic loose stool.  He reports lightheadedness and fatigue which seems to be chronic for him.  He denies chest pain.  He reports some abdominal pain but very vague.  He denies UTI symptoms.  Patient's wife at bedside.  Objective: Vitals:   02/22/22 1658 02/22/22 2309 02/22/22 2359 02/23/22 0448  BP: (!) 123/94 107/68  102/74  Pulse: 98 81 82 61  Resp: '14 16  16  '$ Temp: 97.6 F (36.4 C) 98.7 F (37.1 C)  98.2 F (36.8 C)  TempSrc: Oral Oral  Oral  SpO2: 95% 99%  97%  Weight:      Height:        Examination:  GENERAL: No apparent distress.  Nontoxic. HEENT: MMM.  Vision and hearing grossly intact.  NECK: Supple.  No apparent JVD.  RESP:  No IWOB.  Fair aeration bilaterally. CVS:  RRR.  Mechanical heart sounds. ABD/GI/GU: BS+. Abd soft, NTND.  MSK/EXT:  Moves extremities. No apparent deformity.  2+ BLE edema (chronic). SKIN: no apparent skin lesion or wound NEURO: Sleepy on bedside chair but wakes to voice.  Fairly oriented.  No apparent focal neuro deficit. PSYCH: Calm. Normal affect.   Procedures:  None  Microbiology  summarized: None  Assessment and plan: Principal Problem:   GIB (gastrointestinal bleeding) Active Problems:   S/P aortic valve replacement with bioprosthetic valve   Rheumatoid arthritis (HCC)   Chronic combined CHF   Paroxysmal atrial fibrillation (HCC)   Hypertension   GERD (gastroesophageal reflux disease)   Protein-calorie malnutrition, severe   Elevated troponin   Anemia of chronic disease   Stage 3b chronic kidney disease (CKD) (HCC)   Hypothyroidism   Physical deconditioning  Possible upper GI bleed/anemia of chronic disease: Patient is followed by hematology and gets intermittent blood transfusions.  Seems symptomatic but his symptoms are not acute.  Continues to endorse melena.  He is on Eliquis for A-fib.  It seems he was transfused 2 units after Hgb of 7.4 on 6/5.  Recent Labs    10/04/21 0930 10/24/21 0916 11/14/21 1031 12/05/21 0919 12/26/21 1005 01/16/22 0921 02/06/22 0747 02/22/22 1400 02/22/22 2106 02/23/22 0204  HGB 8.0* 8.6* 7.3* 8.7* 7.1* 9.1* 7.4* 8.4* 8.4* 7.9*  -[facility] following-plan for EGD on 6/23 after Eliquis washout -Monitor H&H and transfuse for Hgb less than 8.0 -Continue holding Eliquis. -Check anemia panel, LDH and haptoglobin in the morning -Continue PPI  Paroxysmal A-fib: He is in A-fib but rate controlled. -Continue home amiodarone -Continue holding Eliquis   Chronic combined CHF: TTE in 02/2020 with LVEF of 45 to 50%, G2 DD, RVSP of 29.4,  moderate LAE and RAE and valve in valve TAVR with normal blood flow.  Seems to be on Lasix 40 mg daily as needed.  He has significant BLE edema unchanged from baseline per patient and family. -Discontinue torsemide -P.o. Lasix 40 mg every other day -Monitor intake and output, daily weight and renal functions.  History of bioprosthetic aortic valve: Normal flow on his TTE from 02/2020.   Elevated troponin: Likely demand ischemia from anemia and delayed clearance from CKD-3B.  No chest pain or  significant delta to suggest ACS.  No acute ischemic finding on EKG either. -Manage anemia as above    CKD-3B: Creatinine seems to be at baseline. Recent Labs    08/03/21 0809 09/12/21 0918 10/04/21 0930 10/24/21 0916 11/14/21 1031 12/05/21 0919 12/26/21 1005 01/16/22 0921 02/06/22 0747 02/23/22 0204  BUN 35* 28* 33* 43* 31* 27* 27* 28* 35* 31*  CREATININE 1.79* 1.84* 1.98* 2.07* 1.72* 1.87* 1.86* 1.72* 1.96* 1.74*  -Continue monitoring  Rheumatoid arthritis -Continue home Arava and prednisone  Physical deconditioning -PT/OT eval  BPH -Continue home Proscar  Hypothyroidism -Check TSH -Continue home Synthroid.  Severe malnutrition Body mass index is 19.49 kg/m. -Consult dietitian.           DVT prophylaxis:  SCDs Start: 02/22/22 1725  Code Status: DNR/DNI Family Communication: Updated patient's wife at bedside Level of care: Telemetry Status is: Inpatient Remains inpatient appropriate because: Due to upper GI bleed requiring further evaluation/endoscopy   Final disposition: TBD Consultants:  Gastroenterology  Sch Meds:  Scheduled Meds:  amiodarone  200 mg Oral Daily   folic acid  1 mg Oral Daily   leflunomide  20 mg Oral Daily   levothyroxine  88 mcg Oral q morning   multivitamin with minerals  1 tablet Oral Daily   pantoprazole (PROTONIX) IV  40 mg Intravenous Q12H   predniSONE  5 mg Oral Q breakfast   torsemide  40 mg Oral Daily   Continuous Infusions: PRN Meds:.acetaminophen **OR** acetaminophen, finasteride, ketotifen, ondansetron **OR** ondansetron (ZOFRAN) IV  Antimicrobials: Anti-infectives (From admission, onward)    None        I have personally reviewed the following labs and images: CBC: Recent Labs  Lab 02/22/22 1400 02/22/22 2106 02/23/22 0204  WBC  --   --  8.1  HGB 8.4* 8.4* 7.9*  HCT 28.3* 27.2* 25.1*  MCV  --   --  77.2*  PLT  --   --  116*   BMP &GFR Recent Labs  Lab 02/23/22 0204  NA 145  K 3.7  CL  114*  CO2 24  GLUCOSE 68*  BUN 31*  CREATININE 1.74*  CALCIUM 8.4*   Estimated Creatinine Clearance: 21.9 mL/min (A) (by C-G formula based on SCr of 1.74 mg/dL (H)). Liver & Pancreas: Recent Labs  Lab 02/23/22 0204  AST 28  ALT 15  ALKPHOS 41  BILITOT 0.9  PROT 5.5*  ALBUMIN 3.2*   No results for input(s): "LIPASE", "AMYLASE" in the last 168 hours. No results for input(s): "AMMONIA" in the last 168 hours. Diabetic: No results for input(s): "HGBA1C" in the last 72 hours. No results for input(s): "GLUCAP" in the last 168 hours. Cardiac Enzymes: No results for input(s): "CKTOTAL", "CKMB", "CKMBINDEX", "TROPONINI" in the last 168 hours. No results for input(s): "PROBNP" in the last 8760 hours. Coagulation Profile: No results for input(s): "INR", "PROTIME" in the last 168 hours. Thyroid Function Tests: No results for input(s): "TSH", "T4TOTAL", "FREET4", "T3FREE", "THYROIDAB" in the last  72 hours. Lipid Profile: No results for input(s): "CHOL", "HDL", "LDLCALC", "TRIG", "CHOLHDL", "LDLDIRECT" in the last 72 hours. Anemia Panel: No results for input(s): "VITAMINB12", "FOLATE", "FERRITIN", "TIBC", "IRON", "RETICCTPCT" in the last 72 hours. Urine analysis:    Component Value Date/Time   COLORURINE YELLOW 09/22/2019 1543   APPEARANCEUR CLEAR 09/22/2019 1543   LABSPEC 1.019 09/22/2019 1543   PHURINE 6.0 09/22/2019 1543   GLUCOSEU NEGATIVE 09/22/2019 1543   HGBUR NEGATIVE 09/22/2019 1543   BILIRUBINUR NEGATIVE 09/22/2019 1543   KETONESUR 20 (A) 09/22/2019 1543   PROTEINUR 30 (A) 09/22/2019 1543   UROBILINOGEN 0.2 01/14/2008 0530   NITRITE NEGATIVE 09/22/2019 1543   LEUKOCYTESUR NEGATIVE 09/22/2019 1543   Sepsis Labs: Invalid input(s): "PROCALCITONIN", "LACTICIDVEN"  Microbiology: No results found for this or any previous visit (from the past 240 hour(s)).  Radiology Studies: No results found.    Brittnae Aschenbrenner T. Needham  If 7PM-7AM, please contact  night-coverage [email or web address] 02/23/2022, 1:00 PM

## 2022-02-23 NOTE — Anesthesia Preprocedure Evaluation (Addendum)
Anesthesia Evaluation    Reviewed: Allergy & Precautions, Patient's Chart, lab work & pertinent test results  Airway        Dental   Pulmonary former smoker,           Cardiovascular hypertension, +CHF  + dysrhythmias Atrial Fibrillation   02/25/20 Echo 1. Left ventricular ejection fraction, by estimation, is 45 to 50%. The  left ventricle has mildly decreased function. The left ventricle has no  regional wall motion abnormalities. Left ventricular diastolic parameters  are consistent with Grade II  diastolic dysfunction (pseudonormalization   Neuro/Psych    GI/Hepatic GERD  ,  Endo/Other  Hypothyroidism   Renal/GU      Musculoskeletal  (+) Arthritis , Rheumatoid disorders,    Abdominal   Peds  Hematology  (+) Blood dyscrasia, anemia , Lab Results      Component                Value               Date                      WBC                      8.4                 02/23/2022                HGB                      8.8 (L)             02/23/2022                HCT                      28.3 (L)            02/23/2022                MCV                      78.2 (L)            02/23/2022                PLT                      131 (L)             02/23/2022              Anesthesia Other Findings   Reproductive/Obstetrics                             Anesthesia Physical Anesthesia Plan  ASA: 3  Anesthesia Plan: MAC   Post-op Pain Management:    Induction:   PONV Risk Score and Plan: Treatment may vary due to age or medical condition  Airway Management Planned: Natural Airway and Nasal Cannula  Additional Equipment: None  Intra-op Plan:   Post-operative Plan:   Informed Consent:     Dental advisory given  Plan Discussed with:   Anesthesia Plan Comments: (EGD for Melena and Anemia)        Anesthesia Quick Evaluation

## 2022-02-23 NOTE — Progress Notes (Signed)
[facility] Progress Note  Joshua Hudson 87 y.o. 30-Mar-1935  CC: Upper GI bleed   Subjective: Patient seen and examined at bedside.  Reports continued dark stools.  Denies nausea/vomiting.  Denies abdominal pain.  ROS : Review of Systems  Constitutional:  Negative for chills, fever and weight loss.  Gastrointestinal:  Positive for melena. Negative for abdominal pain, blood in stool, constipation, diarrhea, heartburn, nausea and vomiting.      Objective: Vital signs in last 24 hours: Vitals:   02/22/22 2359 02/23/22 0448  BP:  102/74  Pulse: 82 61  Resp:  16  Temp:  98.2 F (36.8 C)  SpO2:  97%    Physical Exam:  General:  Alert, cooperative, thin appearing  Head:  Normocephalic, without obvious abnormality, atraumatic  Eyes:  Anicteric sclera, EOM's intact, conjunctival pallor  Lungs:   Clear to auscultation bilaterally, respirations unlabored  Heart:  Regular rate and rhythm, S1, S2 normal  Abdomen:   Soft, non-tender, bowel sounds active all four quadrants,  no masses,     Lab Results: Recent Labs    02/23/22 0204  NA 145  K 3.7  CL 114*  CO2 24  GLUCOSE 68*  BUN 31*  CREATININE 1.74*  CALCIUM 8.4*   Recent Labs    02/23/22 0204  AST 28  ALT 15  ALKPHOS 41  BILITOT 0.9  PROT 5.5*  ALBUMIN 3.2*   Recent Labs    02/22/22 2106 02/23/22 0204  WBC  --  8.1  HGB 8.4* 7.9*  HCT 27.2* 25.1*  MCV  --  77.2*  PLT  --  116*   No results for input(s): "LABPROT", "INR" in the last 72 hours.    Assessment Melena -Hgb 7.9 (9.2 yesterday) -BUN 31, trending upward - creatinine 1.74 -Ferritin 113   Afib on Eliquis  (Last dose 6/21 AM)   CHF - ECHO 45-50% EF 2021   Plan: Continue to hold Eliquis. Plan for EGD tomorrow. I thoroughly discussed the procedures to include nature, alternatives, benefits, and risks including but not limited to bleeding, perforation, infection, anesthesia/cardiac and pulmonary complications. Patient  provides understanding and gave verbal consent to proceed. Continue Protonix IV '40mg'$  BID Continue full liquid diet NPO at midnight Continue supportive care as needed. [facility] will follow.     Saturnino Liew Radford Pax PA-C 02/23/2022, 10:01 AM  Contact #  [phone]

## 2022-02-24 ENCOUNTER — Inpatient Hospital Stay (HOSPITAL_COMMUNITY): Payer: Medicare PPO

## 2022-02-24 ENCOUNTER — Inpatient Hospital Stay (HOSPITAL_COMMUNITY): Payer: Medicare PPO | Admitting: Anesthesiology

## 2022-02-24 ENCOUNTER — Encounter (HOSPITAL_COMMUNITY): Payer: Self-pay | Admitting: Internal Medicine

## 2022-02-24 ENCOUNTER — Encounter (HOSPITAL_COMMUNITY)
Admission: EM | Disposition: A | Discharge status: Home / Self Care | Payer: Self-pay | Source: Home / Self Care | Attending: Student

## 2022-02-24 DIAGNOSIS — K449 Diaphragmatic hernia without obstruction or gangrene: Secondary | ICD-10-CM

## 2022-02-24 DIAGNOSIS — I5042 Chronic combined systolic (congestive) and diastolic (congestive) heart failure: Secondary | ICD-10-CM | POA: Diagnosis not present

## 2022-02-24 DIAGNOSIS — E876 Hypokalemia: Secondary | ICD-10-CM

## 2022-02-24 DIAGNOSIS — D638 Anemia in other chronic diseases classified elsewhere: Secondary | ICD-10-CM | POA: Diagnosis not present

## 2022-02-24 DIAGNOSIS — R600 Localized edema: Secondary | ICD-10-CM | POA: Diagnosis not present

## 2022-02-24 DIAGNOSIS — K921 Melena: Secondary | ICD-10-CM | POA: Diagnosis not present

## 2022-02-24 DIAGNOSIS — R778 Other specified abnormalities of plasma proteins: Secondary | ICD-10-CM | POA: Diagnosis not present

## 2022-02-24 DIAGNOSIS — K224 Dyskinesia of esophagus: Secondary | ICD-10-CM

## 2022-02-24 DIAGNOSIS — K3189 Other diseases of stomach and duodenum: Secondary | ICD-10-CM

## 2022-02-24 DIAGNOSIS — D696 Thrombocytopenia, unspecified: Secondary | ICD-10-CM

## 2022-02-24 DIAGNOSIS — T182XXA Foreign body in stomach, initial encounter: Secondary | ICD-10-CM

## 2022-02-24 DIAGNOSIS — R5383 Other fatigue: Secondary | ICD-10-CM

## 2022-02-24 HISTORY — PX: ESOPHAGOGASTRODUODENOSCOPY: SHX5428

## 2022-02-24 LAB — ECHOCARDIOGRAM COMPLETE
AR max vel: 1.1 cm2
AV Area VTI: 1.1 cm2
AV Area mean vel: 1.05 cm2
AV Mean grad: 6.5 mmHg
AV Peak grad: 10.8 mmHg
Ao pk vel: 1.64 m/s
Area-P 1/2: 5.2 cm2
Calc EF: 56.4 %
Height: 63 in
MV M vel: 4.33 m/s
MV Peak grad: 75 mmHg
S' Lateral: 3.4 cm
Single Plane A2C EF: 56.8 %
Single Plane A4C EF: 56.8 %
Weight: 1732.8 oz

## 2022-02-24 LAB — IRON AND TIBC
Iron: 41 ug/dL — ABNORMAL LOW (ref 45–182)
Saturation Ratios: 15 % — ABNORMAL LOW (ref 17.9–39.5)
TIBC: 270 ug/dL (ref 250–450)
UIBC: 229 ug/dL

## 2022-02-24 LAB — CBC
HCT: 28.5 % — ABNORMAL LOW (ref 39.0–52.0)
Hemoglobin: 8.8 g/dL — ABNORMAL LOW (ref 13.0–17.0)
MCH: 23.8 pg — ABNORMAL LOW (ref 26.0–34.0)
MCHC: 30.9 g/dL (ref 30.0–36.0)
MCV: 77 fL — ABNORMAL LOW (ref 80.0–100.0)
Platelets: 136 10*3/uL — ABNORMAL LOW (ref 150–400)
RBC: 3.7 MIL/uL — ABNORMAL LOW (ref 4.22–5.81)
RDW: 21.9 % — ABNORMAL HIGH (ref 11.5–15.5)
WBC: 7.8 10*3/uL (ref 4.0–10.5)
nRBC: 0.5 % — ABNORMAL HIGH (ref 0.0–0.2)

## 2022-02-24 LAB — HAPTOGLOBIN: Haptoglobin: 121 mg/dL (ref 38–329)

## 2022-02-24 LAB — RENAL FUNCTION PANEL
Albumin: 3.6 g/dL (ref 3.5–5.0)
Anion gap: 9 (ref 5–15)
BUN: 31 mg/dL — ABNORMAL HIGH (ref 8–23)
CO2: 26 mmol/L (ref 22–32)
Calcium: 8.4 mg/dL — ABNORMAL LOW (ref 8.9–10.3)
Chloride: 110 mmol/L (ref 98–111)
Creatinine, Ser: 2.09 mg/dL — ABNORMAL HIGH (ref 0.61–1.24)
GFR, Estimated: 30 mL/min — ABNORMAL LOW (ref >60)
Glucose, Bld: 74 mg/dL (ref 70–99)
Phosphorus: 3.4 mg/dL (ref 2.5–4.6)
Potassium: 3.3 mmol/L — ABNORMAL LOW (ref 3.5–5.1)
Sodium: 145 mmol/L (ref 135–145)

## 2022-02-24 LAB — RETICULOCYTES
Immature Retic Fract: 36.1 % — ABNORMAL HIGH (ref 2.3–15.9)
RBC.: 3.69 MIL/uL — ABNORMAL LOW (ref 4.22–5.81)
Retic Count, Absolute: 52.4 10*3/uL (ref 19.0–186.0)
Retic Ct Pct: 1.4 % (ref 0.4–3.1)

## 2022-02-24 LAB — FOLATE: Folate: >40 ng/mL (ref >5.9)

## 2022-02-24 LAB — VITAMIN B12: Vitamin B-12: 1225 pg/mL — ABNORMAL HIGH (ref 180–914)

## 2022-02-24 LAB — FERRITIN: Ferritin: 45 ng/mL (ref 24–336)

## 2022-02-24 LAB — TSH: TSH: 0.691 u[IU]/mL (ref 0.350–4.500)

## 2022-02-24 LAB — MAGNESIUM: Magnesium: 2.2 mg/dL (ref 1.7–2.4)

## 2022-02-24 SURGERY — EGD (ESOPHAGOGASTRODUODENOSCOPY)
Anesthesia: Monitor Anesthesia Care

## 2022-02-24 MED ORDER — LIDOCAINE 2% (20 MG/ML) 5 ML SYRINGE
INTRAMUSCULAR | Status: DC | PRN
  Administered 2022-02-24: 40 mg via INTRAVENOUS

## 2022-02-24 MED ORDER — PANTOPRAZOLE SODIUM 40 MG PO TBEC
40.0000 mg | DELAYED_RELEASE_TABLET | Freq: Two times a day (BID) | ORAL | Status: DC
  Administered 2022-02-24 – 2022-02-25 (×2): 40 mg via ORAL
  Filled 2022-02-24 (×2): qty 1

## 2022-02-24 MED ORDER — POTASSIUM CHLORIDE 2 MEQ/ML IV SOLN
INTRAVENOUS | Status: AC
  Filled 2022-02-24 (×2): qty 1000

## 2022-02-24 MED ORDER — PHENYLEPHRINE 80 MCG/ML (10ML) SYRINGE FOR IV PUSH (FOR BLOOD PRESSURE SUPPORT)
PREFILLED_SYRINGE | INTRAVENOUS | Status: DC | PRN
  Administered 2022-02-24 (×3): 80 ug via INTRAVENOUS

## 2022-02-24 MED ORDER — LACTATED RINGERS IV SOLN: INTRAVENOUS | Status: DC | PRN

## 2022-02-24 MED ORDER — PROPOFOL 10 MG/ML IV BOLUS
INTRAVENOUS | Status: DC | PRN
  Administered 2022-02-24: 40 mg via INTRAVENOUS
  Administered 2022-02-24 (×2): 20 mg via INTRAVENOUS

## 2022-02-24 MED ORDER — POTASSIUM CHLORIDE 10 MEQ/100ML IV SOLN
10.0000 meq | INTRAVENOUS | Status: DC
  Administered 2022-02-24 (×3): 10 meq via INTRAVENOUS
  Filled 2022-02-24: qty 100

## 2022-02-24 MED ORDER — METOPROLOL TARTRATE 5 MG/5ML IV SOLN: 2.5000 mg | INTRAVENOUS | Status: DC | PRN

## 2022-02-24 MED ORDER — ENSURE ENLIVE PO LIQD
237.0000 mL | Freq: Two times a day (BID) | ORAL | Status: DC
  Administered 2022-02-24: 237 mL via ORAL

## 2022-02-24 MED ORDER — POTASSIUM CHLORIDE CRYS ER 20 MEQ PO TBCR
40.0000 meq | EXTENDED_RELEASE_TABLET | ORAL | Status: AC
  Administered 2022-02-24 (×2): 40 meq via ORAL
  Filled 2022-02-24 (×2): qty 2

## 2022-02-24 NOTE — Anesthesia Postprocedure Evaluation (Signed)
Anesthesia Post Note  Patient: Joshua Hudson  Procedure(s) Performed: ESOPHAGOGASTRODUODENOSCOPY (EGD)     Patient location during evaluation: PACU Anesthesia Type: MAC Level of consciousness: awake and alert Pain management: pain level controlled Vital Signs Assessment: post-procedure vital signs reviewed and stable Respiratory status: spontaneous breathing, nonlabored ventilation and respiratory function stable Cardiovascular status: blood pressure returned to baseline Postop Assessment: no apparent nausea or vomiting Anesthetic complications: no   No notable events documented.  Last Vitals:  Vitals:   02/24/22 1436 02/24/22 1446  BP:  125/87  Pulse:  87  Resp: 18 18  Temp:  36.4 C  SpO2:  100%    Last Pain:  Vitals:   02/24/22 1446  TempSrc: Oral  PainSc:                  [name]

## 2022-02-24 NOTE — Progress Notes (Signed)
Initial Nutrition Assessment   INTERVENTION:   -Ensure Plus High Protein po BID, each supplement provides 350 kcal and 20 grams of protein.   NUTRITION DIAGNOSIS:   Increased nutrient needs related to acute illness as evidenced by estimated needs.  GOAL:   Patient will meet greater than or equal to 90% of their needs  MONITOR:   PO intake, Supplement acceptance, Labs, Weight trends, I & O's, Diet advancement  REASON FOR ASSESSMENT:   Consult Assessment of nutrition requirement/status  ASSESSMENT:   87 year old M with PMH of paroxysmal A-fib on Eliquis, bioprosthetic AV, ACD, thrombocytopenia, CKD-3B, RA, chronic combined CHF, HTN, GERD and hypothyroidism directed to ED by urology for abnormal lab.  Reportedly had melena for about 2 weeks.  Patient in endoscopy for EGD.  Per chart review, pt with GI bleeding. IDA identified in labs. NPO for procedure. Was consuming 100% of meals on dysphagia 3.   Per weight records, weight is stable.   Medications: Folic acid, Lasix, Multivitamin with minerals daily, KCl  Labs reviewed:  Low K Low iron (41)  NUTRITION - FOCUSED PHYSICAL EXAM:  Pt in endoscopy  Diet Order:   Diet Order             DIET SOFT Room service appropriate? Yes; Fluid consistency: Thin  Diet effective now                   EDUCATION NEEDS:   Not appropriate for education at this time  Skin:  Skin Assessment: Reviewed RN Assessment  Last BM:  6/22 -type 5  Height:   Ht Readings from Last 1 Encounters:  02/22/22 5\' 3"  (1.6 m)    Weight:   Wt Readings from Last 1 Encounters:  02/24/22 49.1 kg    BMI:  Body mass index is 19.18 kg/m.  Estimated Nutritional Needs:   Kcal:  1450-1650  Protein:  65-80g  Fluid:  1.7L/day   Tilda Franco, MS, RD, LDN Inpatient Clinical Dietitian Contact information available via Amion

## 2022-02-25 ENCOUNTER — Encounter: Payer: Self-pay | Admitting: Hematology and Oncology

## 2022-02-25 DIAGNOSIS — K921 Melena: Secondary | ICD-10-CM | POA: Diagnosis not present

## 2022-02-25 DIAGNOSIS — R778 Other specified abnormalities of plasma proteins: Secondary | ICD-10-CM | POA: Diagnosis not present

## 2022-02-25 DIAGNOSIS — I10 Essential (primary) hypertension: Secondary | ICD-10-CM

## 2022-02-25 DIAGNOSIS — I5042 Chronic combined systolic (congestive) and diastolic (congestive) heart failure: Secondary | ICD-10-CM | POA: Diagnosis not present

## 2022-02-25 DIAGNOSIS — R5381 Other malaise: Secondary | ICD-10-CM

## 2022-02-25 DIAGNOSIS — D638 Anemia in other chronic diseases classified elsewhere: Secondary | ICD-10-CM | POA: Diagnosis not present

## 2022-02-25 LAB — CBC
HCT: 28.5 % — ABNORMAL LOW (ref 39.0–52.0)
Hemoglobin: 8.8 g/dL — ABNORMAL LOW (ref 13.0–17.0)
MCH: 23.8 pg — ABNORMAL LOW (ref 26.0–34.0)
MCHC: 30.9 g/dL (ref 30.0–36.0)
MCV: 77 fL — ABNORMAL LOW (ref 80.0–100.0)
Platelets: 144 10*3/uL — ABNORMAL LOW (ref 150–400)
RBC: 3.7 MIL/uL — ABNORMAL LOW (ref 4.22–5.81)
RDW: 22.1 % — ABNORMAL HIGH (ref 11.5–15.5)
WBC: 8 10*3/uL (ref 4.0–10.5)
nRBC: 0.5 % — ABNORMAL HIGH (ref 0.0–0.2)

## 2022-02-25 LAB — RENAL FUNCTION PANEL
Albumin: 3.6 g/dL (ref 3.5–5.0)
Anion gap: 7 (ref 5–15)
BUN: 36 mg/dL — ABNORMAL HIGH (ref 8–23)
CO2: 27 mmol/L (ref 22–32)
Calcium: 9 mg/dL (ref 8.9–10.3)
Chloride: 110 mmol/L (ref 98–111)
Creatinine, Ser: 1.99 mg/dL — ABNORMAL HIGH (ref 0.61–1.24)
GFR, Estimated: 32 mL/min — ABNORMAL LOW (ref >60)
Glucose, Bld: 94 mg/dL (ref 70–99)
Phosphorus: 2.5 mg/dL (ref 2.5–4.6)
Potassium: 4.7 mmol/L (ref 3.5–5.1)
Sodium: 144 mmol/L (ref 135–145)

## 2022-02-25 LAB — MAGNESIUM: Magnesium: 2.2 mg/dL (ref 1.7–2.4)

## 2022-02-25 LAB — BRAIN NATRIURETIC PEPTIDE: B Natriuretic Peptide: 351.9 pg/mL — ABNORMAL HIGH (ref 0.0–100.0)

## 2022-02-25 MED ORDER — PANTOPRAZOLE SODIUM 40 MG PO TBEC
DELAYED_RELEASE_TABLET | ORAL | 0 refills | Status: AC
Start: 2022-02-25 — End: 2024-05-28

## 2022-02-25 MED ORDER — SODIUM CHLORIDE 0.9 % IV SOLN
250.0000 mg | Freq: Once | INTRAVENOUS | Status: AC
  Administered 2022-02-25: 250 mg via INTRAVENOUS
  Filled 2022-02-25: qty 20

## 2022-02-25 MED ORDER — APIXABAN 2.5 MG PO TABS: 2.5000 mg | ORAL_TABLET | Freq: Two times a day (BID) | ORAL | 1 refills | Status: DC

## 2022-02-27 ENCOUNTER — Inpatient Hospital Stay: Payer: Medicare PPO

## 2022-02-27 ENCOUNTER — Other Ambulatory Visit: Payer: Medicare PPO

## 2022-02-28 ENCOUNTER — Emergency Department (HOSPITAL_COMMUNITY): Payer: Medicare PPO

## 2022-02-28 ENCOUNTER — Encounter (HOSPITAL_COMMUNITY): Payer: Self-pay

## 2022-02-28 ENCOUNTER — Inpatient Hospital Stay (HOSPITAL_COMMUNITY)
Admission: EM | Admit: 2022-02-28 | Discharge: 2022-03-11 | DRG: 377 | Disposition: A | Discharge status: Home / Self Care | Payer: Medicare PPO | Attending: Family Medicine | Admitting: Family Medicine

## 2022-02-28 ENCOUNTER — Other Ambulatory Visit: Payer: Self-pay

## 2022-02-28 DIAGNOSIS — R5383 Other fatigue: Secondary | ICD-10-CM | POA: Diagnosis not present

## 2022-02-28 DIAGNOSIS — I130 Hypertensive heart and chronic kidney disease with heart failure and stage 1 through stage 4 chronic kidney disease, or unspecified chronic kidney disease: Secondary | ICD-10-CM | POA: Diagnosis not present

## 2022-02-28 DIAGNOSIS — T502X5A Adverse effect of carbonic-anhydrase inhibitors, benzothiadiazides and other diuretics, initial encounter: Secondary | ICD-10-CM | POA: Diagnosis not present

## 2022-02-28 DIAGNOSIS — Z87442 Personal history of urinary calculi: Secondary | ICD-10-CM

## 2022-02-28 DIAGNOSIS — K31811 Angiodysplasia of stomach and duodenum with bleeding: Principal | ICD-10-CM | POA: Diagnosis present

## 2022-02-28 DIAGNOSIS — K922 Gastrointestinal hemorrhage, unspecified: Secondary | ICD-10-CM | POA: Diagnosis present

## 2022-02-28 DIAGNOSIS — D62 Acute posthemorrhagic anemia: Secondary | ICD-10-CM | POA: Diagnosis present

## 2022-02-28 DIAGNOSIS — Z79899 Other long term (current) drug therapy: Secondary | ICD-10-CM

## 2022-02-28 DIAGNOSIS — I248 Other forms of acute ischemic heart disease: Secondary | ICD-10-CM | POA: Diagnosis not present

## 2022-02-28 DIAGNOSIS — D122 Benign neoplasm of ascending colon: Secondary | ICD-10-CM | POA: Diagnosis not present

## 2022-02-28 DIAGNOSIS — I4892 Unspecified atrial flutter: Secondary | ICD-10-CM | POA: Diagnosis not present

## 2022-02-28 DIAGNOSIS — N1832 Chronic kidney disease, stage 3b: Secondary | ICD-10-CM | POA: Diagnosis present

## 2022-02-28 DIAGNOSIS — Z953 Presence of xenogenic heart valve: Secondary | ICD-10-CM

## 2022-02-28 DIAGNOSIS — Z8261 Family history of arthritis: Secondary | ICD-10-CM

## 2022-02-28 DIAGNOSIS — K219 Gastro-esophageal reflux disease without esophagitis: Secondary | ICD-10-CM | POA: Diagnosis present

## 2022-02-28 DIAGNOSIS — I491 Atrial premature depolarization: Secondary | ICD-10-CM | POA: Diagnosis not present

## 2022-02-28 DIAGNOSIS — I959 Hypotension, unspecified: Secondary | ICD-10-CM | POA: Diagnosis present

## 2022-02-28 DIAGNOSIS — Z9861 Coronary angioplasty status: Secondary | ICD-10-CM

## 2022-02-28 DIAGNOSIS — I5043 Acute on chronic combined systolic (congestive) and diastolic (congestive) heart failure: Secondary | ICD-10-CM | POA: Diagnosis not present

## 2022-02-28 DIAGNOSIS — I471 Supraventricular tachycardia: Secondary | ICD-10-CM | POA: Diagnosis present

## 2022-02-28 DIAGNOSIS — D696 Thrombocytopenia, unspecified: Secondary | ICD-10-CM | POA: Diagnosis not present

## 2022-02-28 DIAGNOSIS — I110 Hypertensive heart disease with heart failure: Secondary | ICD-10-CM | POA: Diagnosis not present

## 2022-02-28 DIAGNOSIS — Z86010 Personal history of colonic polyps: Secondary | ICD-10-CM

## 2022-02-28 DIAGNOSIS — R54 Age-related physical debility: Secondary | ICD-10-CM | POA: Diagnosis present

## 2022-02-28 DIAGNOSIS — D509 Iron deficiency anemia, unspecified: Secondary | ICD-10-CM | POA: Diagnosis not present

## 2022-02-28 DIAGNOSIS — Z66 Do not resuscitate: Secondary | ICD-10-CM | POA: Diagnosis not present

## 2022-02-28 DIAGNOSIS — I4819 Other persistent atrial fibrillation: Secondary | ICD-10-CM | POA: Diagnosis present

## 2022-02-28 DIAGNOSIS — K259 Gastric ulcer, unspecified as acute or chronic, without hemorrhage or perforation: Secondary | ICD-10-CM | POA: Diagnosis present

## 2022-02-28 DIAGNOSIS — K635 Polyp of colon: Secondary | ICD-10-CM | POA: Diagnosis present

## 2022-02-28 DIAGNOSIS — R9431 Abnormal electrocardiogram [ECG] [EKG]: Secondary | ICD-10-CM | POA: Diagnosis present

## 2022-02-28 DIAGNOSIS — R778 Other specified abnormalities of plasma proteins: Secondary | ICD-10-CM | POA: Diagnosis not present

## 2022-02-28 DIAGNOSIS — Z7901 Long term (current) use of anticoagulants: Secondary | ICD-10-CM

## 2022-02-28 DIAGNOSIS — M47817 Spondylosis without myelopathy or radiculopathy, lumbosacral region: Secondary | ICD-10-CM | POA: Diagnosis present

## 2022-02-28 DIAGNOSIS — K649 Unspecified hemorrhoids: Secondary | ICD-10-CM | POA: Diagnosis not present

## 2022-02-28 DIAGNOSIS — I509 Heart failure, unspecified: Secondary | ICD-10-CM | POA: Diagnosis not present

## 2022-02-28 DIAGNOSIS — D649 Anemia, unspecified: Secondary | ICD-10-CM | POA: Diagnosis not present

## 2022-02-28 DIAGNOSIS — I081 Rheumatic disorders of both mitral and tricuspid valves: Secondary | ICD-10-CM | POA: Diagnosis not present

## 2022-02-28 DIAGNOSIS — R0602 Shortness of breath: Secondary | ICD-10-CM | POA: Diagnosis not present

## 2022-02-28 DIAGNOSIS — Z7952 Long term (current) use of systemic steroids: Secondary | ICD-10-CM | POA: Diagnosis not present

## 2022-02-28 DIAGNOSIS — K220 Achalasia of cardia: Secondary | ICD-10-CM | POA: Diagnosis present

## 2022-02-28 DIAGNOSIS — K921 Melena: Principal | ICD-10-CM

## 2022-02-28 DIAGNOSIS — I441 Atrioventricular block, second degree: Secondary | ICD-10-CM | POA: Diagnosis not present

## 2022-02-28 DIAGNOSIS — I484 Atypical atrial flutter: Secondary | ICD-10-CM | POA: Diagnosis present

## 2022-02-28 DIAGNOSIS — Z8249 Family history of ischemic heart disease and other diseases of the circulatory system: Secondary | ICD-10-CM

## 2022-02-28 DIAGNOSIS — Z79890 Hormone replacement therapy (postmenopausal): Secondary | ICD-10-CM

## 2022-02-28 DIAGNOSIS — E876 Hypokalemia: Secondary | ICD-10-CM | POA: Diagnosis not present

## 2022-02-28 DIAGNOSIS — Z87891 Personal history of nicotine dependence: Secondary | ICD-10-CM

## 2022-02-28 DIAGNOSIS — R531 Weakness: Secondary | ICD-10-CM

## 2022-02-28 DIAGNOSIS — I4891 Unspecified atrial fibrillation: Secondary | ICD-10-CM | POA: Diagnosis not present

## 2022-02-28 DIAGNOSIS — I340 Nonrheumatic mitral (valve) insufficiency: Secondary | ICD-10-CM | POA: Diagnosis not present

## 2022-02-28 DIAGNOSIS — K648 Other hemorrhoids: Secondary | ICD-10-CM | POA: Diagnosis present

## 2022-02-28 DIAGNOSIS — T184XXA Foreign body in colon, initial encounter: Secondary | ICD-10-CM | POA: Diagnosis not present

## 2022-02-28 DIAGNOSIS — N179 Acute kidney failure, unspecified: Secondary | ICD-10-CM | POA: Diagnosis not present

## 2022-02-28 DIAGNOSIS — Z96611 Presence of right artificial shoulder joint: Secondary | ICD-10-CM | POA: Diagnosis present

## 2022-02-28 DIAGNOSIS — M069 Rheumatoid arthritis, unspecified: Secondary | ICD-10-CM | POA: Diagnosis present

## 2022-02-28 DIAGNOSIS — I700 Atherosclerosis of aorta: Secondary | ICD-10-CM | POA: Diagnosis not present

## 2022-02-28 DIAGNOSIS — K3189 Other diseases of stomach and duodenum: Secondary | ICD-10-CM | POA: Diagnosis not present

## 2022-02-28 DIAGNOSIS — I5033 Acute on chronic diastolic (congestive) heart failure: Secondary | ICD-10-CM | POA: Diagnosis not present

## 2022-02-28 DIAGNOSIS — N400 Enlarged prostate without lower urinary tract symptoms: Secondary | ICD-10-CM | POA: Diagnosis present

## 2022-02-28 DIAGNOSIS — E039 Hypothyroidism, unspecified: Secondary | ICD-10-CM | POA: Diagnosis not present

## 2022-02-28 DIAGNOSIS — K5521 Angiodysplasia of colon with hemorrhage: Secondary | ICD-10-CM | POA: Diagnosis not present

## 2022-02-28 DIAGNOSIS — R109 Unspecified abdominal pain: Secondary | ICD-10-CM | POA: Diagnosis not present

## 2022-02-28 DIAGNOSIS — K5710 Diverticulosis of small intestine without perforation or abscess without bleeding: Secondary | ICD-10-CM | POA: Diagnosis not present

## 2022-02-28 DIAGNOSIS — E785 Hyperlipidemia, unspecified: Secondary | ICD-10-CM | POA: Diagnosis present

## 2022-02-28 DIAGNOSIS — K5520 Angiodysplasia of colon without hemorrhage: Secondary | ICD-10-CM | POA: Diagnosis not present

## 2022-02-28 DIAGNOSIS — K644 Residual hemorrhoidal skin tags: Secondary | ICD-10-CM | POA: Diagnosis present

## 2022-02-28 DIAGNOSIS — Z8719 Personal history of other diseases of the digestive system: Secondary | ICD-10-CM

## 2022-02-28 DIAGNOSIS — Z95810 Presence of automatic (implantable) cardiac defibrillator: Secondary | ICD-10-CM

## 2022-02-28 DIAGNOSIS — T503X5A Adverse effect of electrolytic, caloric and water-balance agents, initial encounter: Secondary | ICD-10-CM | POA: Diagnosis not present

## 2022-02-28 LAB — URINALYSIS, ROUTINE W REFLEX MICROSCOPIC
Bilirubin Urine: NEGATIVE
Glucose, UA: NEGATIVE mg/dL
Hgb urine dipstick: NEGATIVE
Ketones, ur: NEGATIVE mg/dL
Leukocytes,Ua: NEGATIVE
Nitrite: NEGATIVE
Protein, ur: NEGATIVE mg/dL
Specific Gravity, Urine: 1.01 (ref 1.005–1.030)
pH: 5 (ref 5.0–8.0)

## 2022-02-28 LAB — CBC
HCT: 25.9 % — ABNORMAL LOW (ref 39.0–52.0)
HCT: 23.2 % — ABNORMAL LOW (ref 39.0–52.0)
Hemoglobin: 8.2 g/dL — ABNORMAL LOW (ref 13.0–17.0)
Hemoglobin: 7.3 g/dL — ABNORMAL LOW (ref 13.0–17.0)
MCH: 23.9 pg — ABNORMAL LOW (ref 26.0–34.0)
MCH: 23.7 pg — ABNORMAL LOW (ref 26.0–34.0)
MCHC: 31.7 g/dL (ref 30.0–36.0)
MCHC: 31.5 g/dL (ref 30.0–36.0)
MCV: 75.5 fL — ABNORMAL LOW (ref 80.0–100.0)
MCV: 75.3 fL — ABNORMAL LOW (ref 80.0–100.0)
Platelets: 94 10*3/uL — ABNORMAL LOW (ref 150–400)
Platelets: 90 10*3/uL — ABNORMAL LOW (ref 150–400)
RBC: 3.43 MIL/uL — ABNORMAL LOW (ref 4.22–5.81)
RBC: 3.08 MIL/uL — ABNORMAL LOW (ref 4.22–5.81)
RDW: 21.4 % — ABNORMAL HIGH (ref 11.5–15.5)
RDW: 21.2 % — ABNORMAL HIGH (ref 11.5–15.5)
WBC: 8.3 10*3/uL (ref 4.0–10.5)
WBC: 8.3 10*3/uL (ref 4.0–10.5)
nRBC: 0.8 % — ABNORMAL HIGH (ref 0.0–0.2)
nRBC: 0.4 % — ABNORMAL HIGH (ref 0.0–0.2)

## 2022-02-28 LAB — COMPREHENSIVE METABOLIC PANEL
ALT: 19 U/L (ref 0–44)
AST: 31 U/L (ref 15–41)
Albumin: 3.6 g/dL (ref 3.5–5.0)
Alkaline Phosphatase: 47 U/L (ref 38–126)
Anion gap: 9 (ref 5–15)
BUN: 33 mg/dL — ABNORMAL HIGH (ref 8–23)
CO2: 24 mmol/L (ref 22–32)
Calcium: 8.2 mg/dL — ABNORMAL LOW (ref 8.9–10.3)
Chloride: 106 mmol/L (ref 98–111)
Creatinine, Ser: 2.34 mg/dL — ABNORMAL HIGH (ref 0.61–1.24)
GFR, Estimated: 27 mL/min — ABNORMAL LOW (ref >60)
Glucose, Bld: 100 mg/dL — ABNORMAL HIGH (ref 70–99)
Potassium: 3.8 mmol/L (ref 3.5–5.1)
Sodium: 139 mmol/L (ref 135–145)
Total Bilirubin: 0.7 mg/dL (ref 0.3–1.2)
Total Protein: 5.9 g/dL — ABNORMAL LOW (ref 6.5–8.1)

## 2022-02-28 LAB — I-STAT CHEM 8, ED
BUN: 32 mg/dL — ABNORMAL HIGH (ref 8–23)
Calcium, Ion: 0.72 mmol/L — CL (ref 1.15–1.40)
Chloride: 104 mmol/L (ref 98–111)
Creatinine, Ser: 2.8 mg/dL — ABNORMAL HIGH (ref 0.61–1.24)
Glucose, Bld: 101 mg/dL — ABNORMAL HIGH (ref 70–99)
HCT: 26 % — ABNORMAL LOW (ref 39.0–52.0)
Hemoglobin: 8.8 g/dL — ABNORMAL LOW (ref 13.0–17.0)
Potassium: 3.7 mmol/L (ref 3.5–5.1)
Sodium: 137 mmol/L (ref 135–145)
TCO2: 25 mmol/L (ref 22–32)

## 2022-02-28 LAB — MAGNESIUM: Magnesium: 1.9 mg/dL (ref 1.7–2.4)

## 2022-02-28 LAB — LACTIC ACID, PLASMA
Lactic Acid, Venous: 1.3 mmol/L (ref 0.5–1.9)
Lactic Acid, Venous: 0.9 mmol/L (ref 0.5–1.9)

## 2022-02-28 LAB — TROPONIN I (HIGH SENSITIVITY)
Troponin I (High Sensitivity): 49 ng/L — ABNORMAL HIGH (ref <18)
Troponin I (High Sensitivity): 48 ng/L — ABNORMAL HIGH (ref <18)

## 2022-02-28 LAB — BRAIN NATRIURETIC PEPTIDE: B Natriuretic Peptide: 256 pg/mL — ABNORMAL HIGH (ref 0.0–100.0)

## 2022-02-28 LAB — POC OCCULT BLOOD, ED: Fecal Occult Bld: POSITIVE — AB

## 2022-02-28 MED ORDER — PANTOPRAZOLE SODIUM 40 MG IV SOLR
40.0000 mg | Freq: Two times a day (BID) | INTRAVENOUS | Status: DC
  Administered 2022-02-28 – 2022-03-04 (×8): 40 mg via INTRAVENOUS
  Filled 2022-02-28 (×8): qty 10

## 2022-02-28 MED ORDER — ACETAMINOPHEN 325 MG PO TABS: 650.0000 mg | ORAL_TABLET | Freq: Four times a day (QID) | ORAL | Status: DC | PRN

## 2022-02-28 MED ORDER — ACETAMINOPHEN 650 MG RE SUPP: 650.0000 mg | Freq: Four times a day (QID) | RECTAL | Status: DC | PRN

## 2022-02-28 MED ORDER — FENTANYL CITRATE PF 50 MCG/ML IJ SOSY
25.0000 ug | PREFILLED_SYRINGE | Freq: Once | INTRAMUSCULAR | Status: AC
  Administered 2022-02-28: 25 ug via INTRAVENOUS
  Filled 2022-02-28: qty 1

## 2022-02-28 NOTE — ED Notes (Signed)
I-stat Chem 8 given to MD and RN

## 2022-02-28 NOTE — Progress Notes (Deleted)
Patient arrived from ED alert and oriented. Patient being settled into room. No distress noted. Patient complained of abdominal pain. Vital signs taken at this point. Will continue to monitor closely.

## 2022-02-28 NOTE — H&P (Signed)
History and Physical    Patient: Joshua Hudson KZS:[id number] DOB: August 06, 1935 DOA: 02/28/2022 DOS: the patient was seen and examined on 03/01/2022 PCP: Lavone Orn, MD  Patient coming from: Home  Chief Complaint:  Chief Complaint  Patient presents with   Rectal Bleeding   HPI: DEVAUGHN SAVANT is a 87 y.o. male with medical history significant of hypertension, chronic combined CHF, paroxysmal A-fib on Eliquis, CKD 3B, rheumatoid arthritis, bioprosthetic AV, ACD, GERD, hypothyroidism who presents to the emergency department due to 1 day onset of rectal bleed. He was recently admitted from 6/21 to 6/24 due to GI bleed during which EGD done on 6/23  showed abnormal esophageal motility, erosive gastropathy with no stigmata of recent bleeding, and small amount of phytobezoar.  GI recommended Protonix 40 mg twice daily for 1 month, followed by once daily and Eliquis was held for 1 week.  IV ferric gluconate to 50 mg was given prior to discharge. Patient endorsed having several episodes of black loose stool yesterday and about 4-5 episodes today, this was associated with weakness, fatigue and a mild right upper quadrant abdominal pain.  Patient called daughter who brought him to the ED.  He denies fever, chills, chest pain, headache, blurry vision  ED Course:  In the emergency department, temperature was 98.32F, respiratory rate was 21/minute, pulse 111 bpm, BP 121/68.  Work-up in the ED showed microcytic anemia, BNP 256, BMP was normal except for BUN/creatinine 33/2.34 (baseline creatinine at 1.7-2.1), magnesium 1.9, lactic acid x2 was negative, troponin x2 - 49 > 48. Chest x-ray showed no active disease.  Mild cardiomegaly without edema or acute airspace disease CT abdomen and pelvis without contrast showed multifactorial degradation, including lack of IV contrast, little if any enteric contrast, and support apparatus artifact. Suspect proctitis, with distribution favoring infection. Not a typical  distribution for ischemic bowel despite the clinical history of bloody diarrhea. Patient was treated with IV fentanyl 25 mcg x 1, IV protonix 40 mg twice daily was started. Gastroenterologist (Dr. Watt Climes) was consulted and recommended admitting patient with clear liquid diet with plan to see patient in the morning.  Hospitalist was asked to admit patient for further evaluation and management.  Review of Systems: Review of systems as noted in the HPI. All other systems reviewed and are negative.   Past Medical History:  Diagnosis Date   Achalasia 06/12/2019   Noted on EGD   Aortic insufficiency 03/24/2019   AI of bioprosthetic AVR severe 4 plus   Chronic diastolic CHF (congestive heart failure) ([place]) 03/24/2019   Colon polyps    s/p diverticular perforation requiring 2-stage repair   Derangement of right shoulder joint    need replacing has no use of   Diverticulosis    DJD (degenerative joint disease), lumbosacral    Dysphagia    eats soft food   Esophageal stricture    GERD (gastroesophageal reflux disease)    Hemolytic anemia ([place])    History of blood transfusion given with 04-15-19 surgery   History of blood transfusion 07/18/2019   History of kidney stones    passed stones   Hypertension    Mitral regurgitation    moderate   Occipital neuralgia    Pancytopenia (HCC)    Postoperative atrial fibrillation ([place]) 05/10/2015   Prosthetic valve dysfunction    Rheumatoid arthritis (HCC)    s/o long term steroids  shoulders and hands   Rotator cuff arthropathy    right   S/P aortic valve replacement with  bioprosthetic valve 01/16/2008   47m Edwards Magna perimount bovine pericardial tissue valve, model 3000   S/P valve-in-valve TAVR 04/15/2019   26 mm Edwards Sapien 3 Ultra transcatheter heart valve placed via percutaneous right transfemoral approach    SBE (subacute bacterial endocarditis) prophylaxis candidate    for dental procedures   Schatzki's ring 06/12/2019    Narrowing, Noted on EGD   Severe aortic stenosis    S/P prosthetic valve replacement w 25 mm Edwards like science percardial tissue valve,[place] - 01/2008   Thrombocytopenia ([place]    Past Surgical History:  Procedure Laterality Date   A-FLUTTER ABLATION N/A 10/10/2019   Procedure: A-FLUTTER ABLATION;  Surgeon: TEvans Lance MD;  [facility];  Service: Cardiovascular;  Laterality: N/A;   APPENDECTOMY     BALLOON DILATION N/A 06/12/2019   Procedure: BALLOON DILATION;  Surgeon: KRonnette Juniper MD;  [facility];  Service: Gastroenterology;  Laterality: N/A;   BOTOX INJECTION N/A 01/22/2018   Procedure: BOTOX INJECTION;  Surgeon: KRonnette Juniper MD;  [facility];  Service: Gastroenterology;  Laterality: N/A;   CARDIAC CATHETERIZATION     09   CARDIAC VALVE REPLACEMENT  01/2008   aortic valve replacement   CARDIOVERSION N/A 09/25/2019   Procedure: CARDIOVERSION;  Surgeon: SJerline Pain MD;  [place] [facility]  Service: Cardiovascular;  Laterality: N/A;   CARDIOVERSION N/A 12/25/2019   Procedure: CARDIOVERSION;  Surgeon: CLelon Perla MD;  [facility];  Service: Cardiovascular;  Laterality: N/A;   CATARACT EXTRACTION W/ INTRAOCULAR LENS  IMPLANT, BILATERAL     COLON RESECTION     diverticulitis    CORONARY ANGIOPLASTY     dental implants     permanent   ESOPHAGEAL MANOMETRY N/A 11/07/2017   Procedure: ESOPHAGEAL MANOMETRY (EM);  Surgeon: KRonnette Juniper MD;  [facility];  Service: Gastroenterology;  Laterality: N/A;   ESOPHAGOGASTRODUODENOSCOPY N/A 02/24/2022   Procedure: ESOPHAGOGASTRODUODENOSCOPY (EGD);  Surgeon: OArta Silence MD;  [facility];  Service: Gastroenterology;  Laterality: N/A;   ESOPHAGOGASTRODUODENOSCOPY (EGD) WITH PROPOFOL N/A 01/22/2018   Procedure: ESOPHAGOGASTRODUODENOSCOPY (EGD) WITH PROPOFOL;  Surgeon: KRonnette Juniper MD;  [facility];  Service: Gastroenterology;  Laterality: N/A;    ESOPHAGOGASTRODUODENOSCOPY (EGD) WITH PROPOFOL N/A 04/30/2019   Procedure: ESOPHAGOGASTRODUODENOSCOPY (EGD) WITH PROPOFOL;  Surgeon: ELaurence Spates MD;  [place] [facility]  Service: Endoscopy;  Laterality: N/A;   ESOPHAGOGASTRODUODENOSCOPY (EGD) WITH PROPOFOL N/A 06/12/2019   Procedure: ESOPHAGOGASTRODUODENOSCOPY (EGD) WITH PROPOFOL;  Surgeon: KRonnette Juniper MD;  [facility];  Service: Gastroenterology;  Laterality: N/A;  with botox injection   EXCISIONAL TOTAL SHOULDER ARTHROPLASTY WITH ANTIBIOTIC SPACER Right 12/31/2018   Procedure: EXCISIONAL TOTAL SHOULDER ARTHROPLASTY WITH ANTIBIOTIC SPACER;  Surgeon: NNetta Cedars MD;  [place] [facility]  Service: Orthopedics;  Laterality: Right;   HERNIA REPAIR     INTRAOPERATIVE TRANSTHORACIC ECHOCARDIOGRAM N/A 04/15/2019   Procedure: Intraoperative Transthoracic Echocardiogram;  Surgeon: CSherren Mocha MD;  [facility]  Service: Open Heart Surgery;  Laterality: N/A;   IRRIGATION AND DEBRIDEMENT SHOULDER Right 11/20/2018    IRRIGATION AND DEBRIDEMENT SHOULDER WITH POLY EXCHANGE (Right Shoulder)   IRRIGATION AND DEBRIDEMENT SHOULDER Right 11/20/2018   Procedure: IRRIGATION AND DEBRIDEMENT SHOULDER WITH POLY EXCHANGE;  Surgeon: NNetta Cedars MD;  [place] [facility]  Service: Orthopedics;  Laterality: Right;   LUMBAR LAMINECTOMY     x 2   REVERSE SHOULDER ARTHROPLASTY Right 03/01/2018   Procedure: RIGHT REVERSE SHOULDER ARTHROPLASTY;  Surgeon: NNetta Cedars MD;  [facility];  Service: Orthopedics;  Laterality: Right;   REVERSE SHOULDER ARTHROPLASTY Right 07/18/2019   Procedure: REVERSE TOTAL SHOULDER ARTHROPLASTY and removal of antiobotic spacer;  Surgeon: Netta Cedars, MD;  [facility];  Service: Orthopedics;  Laterality: Right;  interscalene block   REVERSE SHOULDER ARTHROPLASTY Right 08/06/2019   Procedure: Reduction of dislocated reverse total shoulder and poly exchange;  Surgeon: Netta Cedars, MD;  [facility];  Service:  Orthopedics;  Laterality: Right;  need 1 hour   RIGHT/LEFT HEART CATH AND CORONARY ANGIOGRAPHY N/A 04/02/2019   Procedure: RIGHT/LEFT HEART CATH AND CORONARY ANGIOGRAPHY;  Surgeon: Leonie Man, MD;  Location: [place] [facility];  Service: Cardiovascular;  Laterality: N/A;   SAVORY DILATION N/A 01/22/2018   Procedure: SAVORY DILATION;  Surgeon: Ronnette Juniper, MD;  [facility];  Service: Gastroenterology;  Laterality: N/A;   SAVORY DILATION N/A 04/30/2019   Procedure: SAVORY DILATION;  Surgeon: Laurence Spates, MD;  Location: [place];  Service: Endoscopy;  Laterality: N/A;  With fluro   SHOULDER HEMI-ARTHROPLASTY Right 06/14/2018   Procedure: RIGHT  REVERSE TOTAL SHOULDER OPEN POLY EXCHANGE;  Surgeon: Netta Cedars, MD;  Location: [place];  Service: Orthopedics;  Laterality: Right;   SUBMUCOSAL INJECTION  06/12/2019   Procedure: SUBMUCOSAL INJECTION;  Surgeon: Ronnette Juniper, MD;  [facility];  Service: Gastroenterology;;   TEE WITHOUT CARDIOVERSION N/A 01/29/2014   Procedure: TRANSESOPHAGEAL ECHOCARDIOGRAM (TEE);  Surgeon: Sueanne Margarita, MD;  Location: [facility]OSCOPY;  Service: Cardiovascular;  Laterality: N/A;   TEE WITHOUT CARDIOVERSION N/A 04/02/2019   Procedure: TRANSESOPHAGEAL ECHOCARDIOGRAM (TEE);  Surgeon: Josue Hector, MD;  Location: [facility] ENDOSCOPY;  Service: Cardiovascular;  Laterality: N/A;   TEE WITHOUT CARDIOVERSION N/A 09/25/2019   Procedure: TRANSESOPHAGEAL ECHOCARDIOGRAM (TEE);  Surgeon: Jerline Pain, MD;  Location: [facility] ENDOSCOPY;  Service: Cardiovascular;  Laterality: N/A;   TONSILLECTOMY     TRANSCATHETER AORTIC VALVE REPLACEMENT, TRANSFEMORAL  04/15/2019   TRANSCATHETER AORTIC VALVE REPLACEMENT, TRANSFEMORAL N/A 04/15/2019   Procedure: TRANSCATHETER AORTIC VALVE REPLACEMENT, TRANSFEMORAL with POST BALLOON DILATION;  Surgeon: Sherren Mocha, MD;  Location: [place];  Service: Open Heart Surgery;  Laterality: N/A;    Social History:  reports that he quit smoking  about 38 years ago. His smoking use included cigarettes. He started smoking about 48 years ago. He has a 5.00 pack-year smoking history. He has quit using smokeless tobacco.  His smokeless tobacco use included chew. He reports that he does not currently use alcohol. He reports that he does not use drugs.   No Known Allergies  Family History  Problem Relation Age of Onset   Other Mother        NO HEALTH PROBLEMS   Heart disease Father    Arthritis Father    Heart Problems Sister        RELATED TO A MVA   Suicidality Brother    Other Sister        3 SISTERS IN GOOD HEALTH   Other Brother        2 BROTHERS IN GOOD HEALTH   Other Daughter        2 IN GOOD HEALTH      Prior to Admission medications   Medication Sig Start Date End Date Taking? Authorizing Provider  amiodarone (PACERONE) 200 MG tablet 200 mg daily EXCEPT on Sunday Patient taking differently: Take 200 mg by mouth daily. 10/03/21  Yes Evans Lance, MD  apixaban (ELIQUIS) 2.5 MG TABS tablet Take 1 tablet (2.5  mg total) by mouth 2 (two) times daily. 03/04/22  Yes Mercy Riding, MD  diphenoxylate-atropine (LOMOTIL) 2.5-0.025 MG tablet Take 1 tablet by mouth 3 (three) times daily as needed for diarrhea or loose stools. 02/20/22  Yes [provider]  epoetin alfa-epbx (RETACRIT) 2000 UNIT/ML injection Inject 2,000 Units into the vein See admin instructions. Every 6 weeks.   Yes [provider]  finasteride (PROSCAR) 5 MG tablet Take 5 mg by mouth daily as needed (retention).  06/26/19  Yes [provider]  folic acid (FOLVITE) 1 MG tablet Take 1 mg by mouth daily.   Yes [provider]  ketotifen (ZADITOR) 0.025 % ophthalmic solution Place 1 drop into both eyes 2 (two) times daily as needed (itchy eyes).   Yes [provider]  leflunomide (ARAVA) 20 MG tablet Take 20 mg by mouth daily.   Yes [provider]  levothyroxine (SYNTHROID) 88 MCG tablet Take 88 mcg by mouth every  morning. 02/06/22  Yes [provider]  Multiple Vitamin (MULTIVITAMIN WITH MINERALS) TABS tablet Take 1 tablet by mouth daily.   Yes [provider]  pantoprazole (PROTONIX) 40 MG tablet Take 40 mg by mouth daily.   Yes [provider]  potassium chloride SA (KLOR-CON) 20 MEQ tablet Take 1 tablet (20 mEq total) by mouth daily. NEED OV. Patient taking differently: Take 20 mEq by mouth daily. 03/24/21  Yes Eileen Stanford, PA-C  predniSONE (DELTASONE) 5 MG tablet Take 5 mg by mouth daily with breakfast.    Yes [provider]  tamsulosin (FLOMAX) 0.4 MG CAPS capsule Take 0.4 mg by mouth daily.   Yes [provider]  torsemide (DEMADEX) 20 MG tablet Take 40 mg by mouth daily. 12/31/20  Yes [provider]  traMADol (ULTRAM) 50 MG tablet Take 50 mg by mouth every 6 (six) hours as needed for moderate pain.   Yes [provider]  pantoprazole (PROTONIX) 40 MG tablet Take 1 tablet (40 mg total) by mouth 2 (two) times daily before a meal for 30 days, THEN 1 tablet (40 mg total) daily. Patient not taking: Reported on 02/28/2022 02/25/22 05/26/22  Mercy Riding, MD    Physical Exam: BP 99/67 (BP Location: Right Arm)   Pulse (!) 57   Temp 98.2 F (36.8 C) (Oral)   Resp 18   SpO2 100%   General: 87 y.o. year-old male ill appearing, but in no acute distress.  Alert and oriented x3. HEENT: NCAT, EOMI Neck: Supple, trachea medial Cardiovascular: Tachycardia, irregular rate and rhythm with no rubs or gallops.  No thyromegaly or JVD noted.  No lower extremity edema. 2/4 pulses in all 4 extremities. Respiratory: Clear to auscultation with no wheezes or rales. Good inspiratory effort. Abdomen: Soft, tender to palpation of abdomen without guarding.  Nondistended with normal bowel sounds x4 quadrants. Muskuloskeletal: No cyanosis, clubbing or edema noted bilaterally Neuro: CN II-XII intact, strength 5/5 x 4, sensation, reflexes intact Skin: No  ulcerative lesions noted or rashes Psychiatry: Judgement and insight appear normal. Mood is appropriate for condition and setting          Labs on Admission:  Basic Metabolic Panel: Recent Labs  Lab 02/23/22 0204 02/24/22 0456 02/25/22 0454 02/28/22 1650 02/28/22 1724 02/28/22 2109  NA 145 145 144 139 137  --   K 3.7 3.3* 4.7 3.8 3.7  --   CL 114* 110 110 106 104  --   CO2 '24 26 27 24  '$ --   --  GLUCOSE 68* 74 94 100* 101*  --   BUN 31* 31* 36* 33* 32*  --   CREATININE 1.74* 2.09* 1.99* 2.34* 2.80*  --   CALCIUM 8.4* 8.4* 9.0 8.2*  --   --   MG  --  2.2 2.2  --   --  1.9  PHOS  --  3.4 2.5  --   --   --    Liver Function Tests: Recent Labs  Lab 02/23/22 0204 02/24/22 0456 02/25/22 0454 02/28/22 1650  AST 28  --   --  31  ALT 15  --   --  19  ALKPHOS 41  --   --  47  BILITOT 0.9  --   --  0.7  PROT 5.5*  --   --  5.9*  ALBUMIN 3.2* 3.6 3.6 3.6   No results for input(s): "LIPASE", "AMYLASE" in the last 168 hours. No results for input(s): "AMMONIA" in the last 168 hours. CBC: Recent Labs  Lab 02/23/22 1538 02/24/22 0456 02/25/22 0454 02/28/22 1650 02/28/22 1724 02/28/22 2011  WBC 8.4 7.8 8.0 8.3  --  8.3  HGB 8.8* 8.8* 8.8* 8.2* 8.8* 7.3*  HCT 28.3* 28.5* 28.5* 25.9* 26.0* 23.2*  MCV 78.2* 77.0* 77.0* 75.5*  --  75.3*  PLT 131* 136* 144* 94*  --  90*   Cardiac Enzymes: No results for input(s): "CKTOTAL", "CKMB", "CKMBINDEX", "TROPONINI" in the last 168 hours.  BNP (last 3 results) Recent Labs    02/25/22 0454 02/28/22 1650  BNP 351.9* 256.0*    ProBNP (last 3 results) No results for input(s): "PROBNP" in the last 8760 hours.  CBG: No results for input(s): "GLUCAP" in the last 168 hours.  Radiological Exams on Admission: DG Chest Portable 1 View  Result Date: 02/28/2022 CLINICAL DATA:  Shortness of breath EXAM: PORTABLE CHEST 1 VIEW COMPARISON:  09/22/2019 FINDINGS: Right shoulder replacement. Post sternotomy changes and valve prosthesis. Mild  cardiomegaly with aortic atherosclerosis. No edema or pleural effusion. IMPRESSION: No active disease. Mild cardiomegaly without edema or acute airspace disease Electronically Signed   By: Donavan Foil M.D.   On: 02/28/2022 18:46   CT ABDOMEN PELVIS WO CONTRAST  Result Date: 02/28/2022 CLINICAL DATA:  Recently discharged after having bloody diarrhea. Recurrent today. EXAM: CT ABDOMEN AND PELVIS WITHOUT CONTRAST TECHNIQUE: Multidetector CT imaging of the abdomen and pelvis was performed following the standard protocol without IV contrast. RADIATION DOSE REDUCTION: This exam was performed according to the departmental dose-optimization program which includes automated exposure control, adjustment of the mA and/or kV according to patient size and/or use of iterative reconstruction technique. COMPARISON:  06/13/2019 abdominopelvic CT from [facility]. FINDINGS: Lower chest: Bibasilar scarring. Emphysema. Status post TAVR. Cardiomegaly. Hepatobiliary: Artifact degradation secondary to EKG wires and leads throughout. Subcentimeter right hepatic lobe cyst. Normal gallbladder, without biliary ductal dilatation. Pancreas: Pancreatic atrophy. No duct dilatation or acute inflammation. Spleen: Normal in size, without focal abnormality. Adrenals/Urinary Tract: Normal adrenal glands. No renal calculi or hydronephrosis. Mild renal cortical thinning bilaterally. Fluid density exophytic right renal lesions of maximally 2.8 cm are likely cysts . In the absence of clinically indicated signs/symptoms require(s) no independent follow-up. The ureters are difficult to follow secondary to beam hardening artifact from lumbar spine hardware. The bladder is decompressed, without stone. Stomach/Bowel: Normal stomach, without wall thickening. Transverse duodenal diverticulum. Otherwise normal small bowel. Bowel is suboptimally evaluated secondary to lack of IV and paucity of enteric contrast. Suspect mild rectal wall thickening on  66/2. Normal terminal ileum. Appendix not visualized. Vascular/Lymphatic: Aortic atherosclerosis. No abdominopelvic adenopathy. Reproductive: Normal prostate. Other: No significant free fluid.  No free intraperitoneal air. Musculoskeletal: Defect in the left iliac is likely bone harvest site given lumbar spine fixation. Trans pedicle screw fixation at L3-5 with interbody fusion material at L4-5. Degenerate disc disease at the remainder of the lumbosacral spine with convex left lumbar spine curvature. IMPRESSION: 1. Multifactorial degradation, including lack of IV contrast, little if any enteric contrast, and support apparatus artifact. 2. Suspect proctitis, with distribution favoring infection. Not a typical distribution for ischemic bowel despite the clinical history of bloody diarrhea. 3.  Aortic Atherosclerosis (ICD10-I70.0). Electronically Signed   By: Abigail Miyamoto M.D.   On: 02/28/2022 17:59    EKG: I independently viewed the EKG done and my findings are as followed: Atrial flutter at rate of 105 bpm, QTc 500 ms.  Assessment/Plan Present on Admission:  GI bleed  GERD (gastroesophageal reflux disease)  Hypothyroidism  Elevated troponin  Principal Problem:   GI bleed Active Problems:   GERD (gastroesophageal reflux disease)   Atrial flutter (HCC)   Elevated troponin   Microcytic anemia   Hypothyroidism  Recurrent GI bleed Melena H/H= 8.2/25.9, this was 8.8/28.5 on 6/24 Hemoccult was positive Continue to monitor H/H and consider blood transfusion for persistent decrease in the Continue IV Protonix 40 mg twice daily  Gastroenterologist was already consulted and will consult with patient in the morning per ED physician  Microcytic anemia MCV 75.5, patient has history of blood transfusions every 3 weeks. Continue to monitor H/H and consider blood transfusion for worsening hemoglobin level.  Elevated troponin possible secondary to type II demand ischemia Troponin x2-49 > 48.  He denies  chest pain  Prolonged QTc (500 ms) Avoid QT prolonging drugs Magnesium 1.9; K 3.8 Continue telemetry  Chronic diastolic CHF History of aortic valve replacement Continue home meds  Chronic atrial flutter Continue amiodarone Eliquis will be held at this time due to GI bleed  GERD Continue Protonix  Hypothyroidism Continue Synthroid  DVT prophylaxis: SCDs  Code Status: Full code  Consults: Gastroenterology (by ED physician)  Family Communication: None at bedside  Severity of Illness: The appropriate patient status for this patient is INPATIENT. Inpatient status is judged to be reasonable and necessary in order to provide the required intensity of service to ensure the patient's safety. The patient's presenting symptoms, physical exam findings, and initial radiographic and laboratory data in the context of their chronic comorbidities is felt to place them at high risk for further clinical deterioration. Furthermore, it is not anticipated that the patient will be medically stable for discharge from the hospital within 2 midnights of admission.   * I certify that at the point of admission it is my clinical judgment that the patient will require inpatient hospital care spanning beyond 2 midnights from the point of admission due to high intensity of service, high risk for further deterioration and high frequency of surveillance required.*  Author: Bernadette Hoit, DO 03/01/2022 2:00 AM  For on call review [email or web address].

## 2022-03-01 ENCOUNTER — Encounter (HOSPITAL_COMMUNITY): Payer: Self-pay | Admitting: Gastroenterology

## 2022-03-01 ENCOUNTER — Encounter (HOSPITAL_COMMUNITY)
Admission: EM | Disposition: A | Discharge status: Home / Self Care | Payer: Self-pay | Source: Home / Self Care | Attending: Internal Medicine

## 2022-03-01 DIAGNOSIS — K922 Gastrointestinal hemorrhage, unspecified: Secondary | ICD-10-CM | POA: Diagnosis not present

## 2022-03-01 HISTORY — PX: GIVENS CAPSULE STUDY: SHX5432

## 2022-03-01 LAB — COMPREHENSIVE METABOLIC PANEL
ALT: 17 U/L (ref 0–44)
AST: 25 U/L (ref 15–41)
Albumin: 3.4 g/dL — ABNORMAL LOW (ref 3.5–5.0)
Alkaline Phosphatase: 42 U/L (ref 38–126)
Anion gap: 9 (ref 5–15)
BUN: 34 mg/dL — ABNORMAL HIGH (ref 8–23)
CO2: 24 mmol/L (ref 22–32)
Calcium: 8.2 mg/dL — ABNORMAL LOW (ref 8.9–10.3)
Chloride: 109 mmol/L (ref 98–111)
Creatinine, Ser: 2.13 mg/dL — ABNORMAL HIGH (ref 0.61–1.24)
GFR, Estimated: 30 mL/min — ABNORMAL LOW (ref >60)
Glucose, Bld: 88 mg/dL (ref 70–99)
Potassium: 3.9 mmol/L (ref 3.5–5.1)
Sodium: 142 mmol/L (ref 135–145)
Total Bilirubin: 1.2 mg/dL (ref 0.3–1.2)
Total Protein: 5.5 g/dL — ABNORMAL LOW (ref 6.5–8.1)

## 2022-03-01 LAB — CBC
HCT: 24.1 % — ABNORMAL LOW (ref 39.0–52.0)
HCT: 24.4 % — ABNORMAL LOW (ref 39.0–52.0)
Hemoglobin: 7.7 g/dL — ABNORMAL LOW (ref 13.0–17.0)
Hemoglobin: 7.7 g/dL — ABNORMAL LOW (ref 13.0–17.0)
MCH: 24.2 pg — ABNORMAL LOW (ref 26.0–34.0)
MCH: 24.3 pg — ABNORMAL LOW (ref 26.0–34.0)
MCHC: 32 g/dL (ref 30.0–36.0)
MCHC: 31.6 g/dL (ref 30.0–36.0)
MCV: 75.8 fL — ABNORMAL LOW (ref 80.0–100.0)
MCV: 77 fL — ABNORMAL LOW (ref 80.0–100.0)
Platelets: 142 10*3/uL — ABNORMAL LOW (ref 150–400)
Platelets: 94 10*3/uL — ABNORMAL LOW (ref 150–400)
RBC: 3.18 MIL/uL — ABNORMAL LOW (ref 4.22–5.81)
RBC: 3.17 MIL/uL — ABNORMAL LOW (ref 4.22–5.81)
RDW: 21.2 % — ABNORMAL HIGH (ref 11.5–15.5)
RDW: 22 % — ABNORMAL HIGH (ref 11.5–15.5)
WBC: 7 10*3/uL (ref 4.0–10.5)
WBC: 7.3 10*3/uL (ref 4.0–10.5)
nRBC: 0.9 % — ABNORMAL HIGH (ref 0.0–0.2)
nRBC: 1 % — ABNORMAL HIGH (ref 0.0–0.2)

## 2022-03-01 LAB — PREPARE RBC (CROSSMATCH)

## 2022-03-01 LAB — MAGNESIUM: Magnesium: 2.1 mg/dL (ref 1.7–2.4)

## 2022-03-01 LAB — PROTIME-INR
INR: 1.3 — ABNORMAL HIGH (ref 0.8–1.2)
Prothrombin Time: 15.6 seconds — ABNORMAL HIGH (ref 11.4–15.2)

## 2022-03-01 LAB — PHOSPHORUS: Phosphorus: 3.5 mg/dL (ref 2.5–4.6)

## 2022-03-01 SURGERY — IMAGING PROCEDURE, GI TRACT, INTRALUMINAL, VIA CAPSULE
Anesthesia: LOCAL

## 2022-03-01 MED ORDER — PREDNISONE 5 MG PO TABS
5.0000 mg | ORAL_TABLET | Freq: Every day | ORAL | Status: DC
  Administered 2022-03-01 – 2022-03-11 (×11): 5 mg via ORAL
  Filled 2022-03-01 (×11): qty 1

## 2022-03-01 MED ORDER — LEVOTHYROXINE SODIUM 88 MCG PO TABS
88.0000 ug | ORAL_TABLET | Freq: Every morning | ORAL | Status: DC
  Administered 2022-03-01 – 2022-03-11 (×9): 88 ug via ORAL
  Filled 2022-03-01 (×9): qty 1

## 2022-03-01 MED ORDER — AMIODARONE HCL 200 MG PO TABS
200.0000 mg | ORAL_TABLET | Freq: Every day | ORAL | Status: DC
Start: 2022-03-01 — End: 2022-03-01

## 2022-03-01 MED ORDER — SODIUM CHLORIDE 0.9% IV SOLUTION: Freq: Once | INTRAVENOUS | Status: DC

## 2022-03-01 MED ORDER — FOLIC ACID 1 MG PO TABS
1.0000 mg | ORAL_TABLET | Freq: Every day | ORAL | Status: DC
  Administered 2022-03-01 – 2022-03-11 (×10): 1 mg via ORAL
  Filled 2022-03-01 (×11): qty 1

## 2022-03-01 MED ORDER — AMIODARONE HCL 200 MG PO TABS
200.0000 mg | ORAL_TABLET | ORAL | Status: DC
  Administered 2022-03-01 – 2022-03-02 (×2): 200 mg via ORAL
  Filled 2022-03-01 (×2): qty 1

## 2022-03-01 MED ORDER — FUROSEMIDE 10 MG/ML IJ SOLN
20.0000 mg | Freq: Once | INTRAMUSCULAR | Status: AC
  Administered 2022-03-01: 20 mg via INTRAVENOUS
  Filled 2022-03-01: qty 2

## 2022-03-01 MED ORDER — TAMSULOSIN HCL 0.4 MG PO CAPS
0.4000 mg | ORAL_CAPSULE | Freq: Every day | ORAL | Status: DC
  Administered 2022-03-01 – 2022-03-11 (×9): 0.4 mg via ORAL
  Filled 2022-03-01 (×11): qty 1

## 2022-03-01 MED ORDER — KETOTIFEN FUMARATE 0.025 % OP SOLN
1.0000 [drp] | Freq: Two times a day (BID) | OPHTHALMIC | Status: DC | PRN
Start: 2022-03-01 — End: 2022-03-11

## 2022-03-01 SURGICAL SUPPLY — 1 items: TOWEL COTTON PACK 4EA (MISCELLANEOUS) ×4 IMPLANT

## 2022-03-01 NOTE — Consult Note (Signed)
Referring Provider: Elmarie Shiley, MD Primary Care Physician:  Lavone Orn, MD Primary Gastroenterologist:  Dr. Acie [facility]  Reason for Consultation:  Melena  HPI: Joshua Hudson is a 87 y.o. male with medical history of HTN, chronic combined CHF, paroxysmal A-fib on Eliquis, CKD IIIB, RA, bioprosthetic AV, ACD, GERD, hypothyroidism who presents to the emergency department 02/28/22 due to 1 day onset of rectal bleeding.  He was recently admitted from 6/21 to 6/24 due to GI bleed during which EGD done on 6/23 showed abnormal esophageal motility, erosive gastropathy with no stigmata of recent bleeding, and small amount of phytobezoar.  Recommended Protonix 40 mg twice daily for 1 month, followed by once daily, and Eliquis was held for 1 week. IV ferric gluconate 50 mg was given prior to discharge.  Patient endorsed having several episodes of black loose stool 6/26 and about 4-5 episodes 6/27. This was associated with weakness, fatigue and a mild right upper quadrant abdominal pain.  Patient called daughter who brought him to the ED.  He denies fever, chills, chest pain, headache, blurry vision.   ED Course:  In the emergency department, temperature was 98.54F, respiratory rate was 21/minute, pulse 111 bpm, BP 121/68.  Work-up in the ED showed microcytic anemia, BNP 256, BMP was normal except for BUN/creatinine 33/2.34 (baseline creatinine at 1.7-2.1), magnesium 1.9, lactic acid x2 was negative, troponin x2 - 49 > 48. Chest x-ray showed no active disease.  Mild cardiomegaly without edema or acute airspace disease CT abdomen and pelvis without contrast showed multifactorial degradation, including lack of IV contrast, little, if any enteric contrast, and support apparatus artifact. Suspect proctitis, with distribution favoring infection. Not a typical distribution for ischemic bowel despite the clinical history of bloody diarrhea. Patient was treated with IV fentanyl 25 mcg x 1, IV protonix 40  mg twice daily was started. Gastroenterologist (Dr. Watt Climes) was consulted and recommended admitting patient with clear liquid diet with plan to see patient in the morning.   Patient seen and examined sitting in bedside chair, daughter here in the room with him.  Patient states he had an episode of black tarry stools Monday afternoon, which worsened yesterday.  Had 4-5 bowel movements with black stools yesterday and 4 more today.  He also reports dull right upper quadrant pain which is ongoing and was present during last admission as well, not worsening.  Patient denies alcohol or smoking.  Denies family history of GI malignancy or disease.  Denies MI/stroke in the last 6 months.  He has previously been on Eliquis but is not taking now and has not taken this since last admission.  Is planning to restart on July 1.  Patient states he rarely takes NSAIDs and has not taken any in the last several days.  Denies nausea, vomiting, heartburn, reflux, hematochezia, constipation, fever, chills.  He does report dysphagia which was evaluated by EGD and patient found to have esophageal dysmotility but no evidence of stricture or mass.  Patient's daughter states he goes to oncology clinic every 3 weeks to have his hemoglobin checked and have blood transfusion if needed, usually with hemoglobin denies in the 7.5 range or lower.  Last colonoscopy 02/27/2014 - 3 AVMs in the cecum, moderate internal hemorrhoids, no recall recommended due to age  Past Medical History:  Diagnosis Date   Achalasia 06/12/2019   Noted on EGD   Aortic insufficiency 03/24/2019   AI of bioprosthetic AVR severe 4 plus   Chronic diastolic CHF (congestive heart  failure) ([place]) 03/24/2019   Colon polyps    s/p diverticular perforation requiring 2-stage repair   Derangement of right shoulder joint    need replacing has no use of   Diverticulosis    DJD (degenerative joint disease), lumbosacral    Dysphagia    eats soft food   Esophageal  stricture    GERD (gastroesophageal reflux disease)    Hemolytic anemia ([place])    History of blood transfusion given with 04-15-19 surgery   History of blood transfusion 07/18/2019   History of kidney stones    passed stones   Hypertension    Mitral regurgitation    moderate   Occipital neuralgia    Pancytopenia (HCC)    Postoperative atrial fibrillation ([place]) 05/10/2015   Prosthetic valve dysfunction    Rheumatoid arthritis (HCC)    s/o long term steroids  shoulders and hands   Rotator cuff arthropathy    right   S/P aortic valve replacement with bioprosthetic valve 01/16/2008   51m Edwards Magna perimount bovine pericardial tissue valve, model 3000   S/P valve-in-valve TAVR 04/15/2019   26 mm Edwards Sapien 3 Ultra transcatheter heart valve placed via percutaneous right transfemoral approach    SBE (subacute bacterial endocarditis) prophylaxis candidate    for dental procedures   Schatzki's ring 06/12/2019   Narrowing, Noted on EGD   Severe aortic stenosis    S/P prosthetic valve replacement w 25 mm Edwards like science percardial tissue valve,[place] - 01/2008   Thrombocytopenia ([facility]     Past Surgical History:  Procedure Laterality Date   A-FLUTTER ABLATION N/A 10/10/2019   Procedure: A-FLUTTER ABLATION;  Surgeon: TEvans Lance MD;  [facility];  Service: Cardiovascular;  Laterality: N/A;   APPENDECTOMY     BALLOON DILATION N/A 06/12/2019   Procedure: BALLOON DILATION;  Surgeon: KRonnette Juniper MD;  [facility];  Service: Gastroenterology;  Laterality: N/A;   BOTOX INJECTION N/A 01/22/2018   Procedure: BOTOX INJECTION;  Surgeon: KRonnette Juniper MD;  [facility];  Service: Gastroenterology;  Laterality: N/A;   CARDIAC CATHETERIZATION     09   CARDIAC VALVE REPLACEMENT  01/2008   aortic valve replacement   CARDIOVERSION N/A 09/25/2019   Procedure: CARDIOVERSION;  Surgeon: SJerline Pain MD;  [facility]  Service: Cardiovascular;   Laterality: N/A;   CARDIOVERSION N/A 12/25/2019   Procedure: CARDIOVERSION;  Surgeon: CLelon Perla MD;  [facility];  Service: Cardiovascular;  Laterality: N/A;   CATARACT EXTRACTION W/ INTRAOCULAR LENS  IMPLANT, BILATERAL     COLON RESECTION     diverticulitis    CORONARY ANGIOPLASTY     dental implants     permanent   ESOPHAGEAL MANOMETRY N/A 11/07/2017   Procedure: ESOPHAGEAL MANOMETRY (EM);  Surgeon: KRonnette Juniper MD;  [facility];  Service: Gastroenterology;  Laterality: N/A;   ESOPHAGOGASTRODUODENOSCOPY N/A 02/24/2022   Procedure: ESOPHAGOGASTRODUODENOSCOPY (EGD);  Surgeon: OArta Silence MD;  [facility];  Service: Gastroenterology;  Laterality: N/A;   ESOPHAGOGASTRODUODENOSCOPY (EGD) WITH PROPOFOL N/A 01/22/2018   Procedure: ESOPHAGOGASTRODUODENOSCOPY (EGD) WITH PROPOFOL;  Surgeon: KRonnette Juniper MD;  [facility];  Service: Gastroenterology;  Laterality: N/A;   ESOPHAGOGASTRODUODENOSCOPY (EGD) WITH PROPOFOL N/A 04/30/2019   Procedure: ESOPHAGOGASTRODUODENOSCOPY (EGD) WITH PROPOFOL;  Surgeon: ELaurence Spates MD;  [place] [facility]  Service: Endoscopy;  Laterality: N/A;   ESOPHAGOGASTRODUODENOSCOPY (EGD) WITH PROPOFOL N/A 06/12/2019   Procedure: ESOPHAGOGASTRODUODENOSCOPY (EGD) WITH PROPOFOL;  Surgeon: KRonnette Juniper MD;  [facility];  Service: Gastroenterology;  Laterality: N/A;  with botox injection   EXCISIONAL TOTAL SHOULDER ARTHROPLASTY WITH ANTIBIOTIC SPACER Right 12/31/2018   Procedure: EXCISIONAL TOTAL SHOULDER ARTHROPLASTY WITH ANTIBIOTIC SPACER;  Surgeon: Netta Cedars, MD;  Location: [place];  Service: Orthopedics;  Laterality: Right;   HERNIA REPAIR     INTRAOPERATIVE TRANSTHORACIC ECHOCARDIOGRAM N/A 04/15/2019   Procedure: Intraoperative Transthoracic Echocardiogram;  Surgeon: Sherren Mocha, MD;  Location: [place];  Service: Open Heart Surgery;  Laterality: N/A;   IRRIGATION AND DEBRIDEMENT SHOULDER Right 11/20/2018     IRRIGATION AND DEBRIDEMENT SHOULDER WITH POLY EXCHANGE (Right Shoulder)   IRRIGATION AND DEBRIDEMENT SHOULDER Right 11/20/2018   Procedure: IRRIGATION AND DEBRIDEMENT SHOULDER WITH POLY EXCHANGE;  Surgeon: Netta Cedars, MD;  Location: [place];  Service: Orthopedics;  Laterality: Right;   LUMBAR LAMINECTOMY     x 2   REVERSE SHOULDER ARTHROPLASTY Right 03/01/2018   Procedure: RIGHT REVERSE SHOULDER ARTHROPLASTY;  Surgeon: Netta Cedars, MD;  Location: [place];  Service: Orthopedics;  Laterality: Right;   REVERSE SHOULDER ARTHROPLASTY Right 07/18/2019   Procedure: REVERSE TOTAL SHOULDER ARTHROPLASTY and removal of antiobotic spacer;  Surgeon: Netta Cedars, MD;  [facility];  Service: Orthopedics;  Laterality: Right;  interscalene block   REVERSE SHOULDER ARTHROPLASTY Right 08/06/2019   Procedure: Reduction of dislocated reverse total shoulder and poly exchange;  Surgeon: Netta Cedars, MD;  [facility];  Service: Orthopedics;  Laterality: Right;  need 1 hour   RIGHT/LEFT HEART CATH AND CORONARY ANGIOGRAPHY N/A 04/02/2019   Procedure: RIGHT/LEFT HEART CATH AND CORONARY ANGIOGRAPHY;  Surgeon: Leonie Man, MD;  Location: [place] [facility];  Service: Cardiovascular;  Laterality: N/A;   SAVORY DILATION N/A 01/22/2018   Procedure: SAVORY DILATION;  Surgeon: Ronnette Juniper, MD;  [facility];  Service: Gastroenterology;  Laterality: N/A;   SAVORY DILATION N/A 04/30/2019   Procedure: SAVORY DILATION;  Surgeon: Laurence Spates, MD;  Location: [place];  Service: Endoscopy;  Laterality: N/A;  With fluro   SHOULDER HEMI-ARTHROPLASTY Right 06/14/2018   Procedure: RIGHT  REVERSE TOTAL SHOULDER OPEN POLY EXCHANGE;  Surgeon: Netta Cedars, MD;  Location: [place];  Service: Orthopedics;  Laterality: Right;   SUBMUCOSAL INJECTION  06/12/2019   Procedure: SUBMUCOSAL INJECTION;  Surgeon: Ronnette Juniper, MD;  [facility];  Service: Gastroenterology;;   TEE WITHOUT CARDIOVERSION N/A 01/29/2014    Procedure: TRANSESOPHAGEAL ECHOCARDIOGRAM (TEE);  Surgeon: Sueanne Margarita, MD;  [facility];  Service: Cardiovascular;  Laterality: N/A;   TEE WITHOUT CARDIOVERSION N/A 04/02/2019   Procedure: TRANSESOPHAGEAL ECHOCARDIOGRAM (TEE);  Surgeon: Josue Hector, MD;  Location: [facility];  Service: Cardiovascular;  Laterality: N/A;   TEE WITHOUT CARDIOVERSION N/A 09/25/2019   Procedure: TRANSESOPHAGEAL ECHOCARDIOGRAM (TEE);  Surgeon: Jerline Pain, MD;  Location: [facility];  Service: Cardiovascular;  Laterality: N/A;   TONSILLECTOMY     TRANSCATHETER AORTIC VALVE REPLACEMENT, TRANSFEMORAL  04/15/2019   TRANSCATHETER AORTIC VALVE REPLACEMENT, TRANSFEMORAL N/A 04/15/2019   Procedure: TRANSCATHETER AORTIC VALVE REPLACEMENT, TRANSFEMORAL with POST BALLOON DILATION;  Surgeon: Sherren Mocha, MD;  Location: [place];  Service: Open Heart Surgery;  Laterality: N/A;    Prior to Admission medications   Medication Sig Start Date End Date Taking? Authorizing Provider  amiodarone (PACERONE) 200 MG tablet 200 mg daily EXCEPT on Sunday Patient taking differently: Take 200 mg by mouth daily. 10/03/21  Yes Evans Lance, MD  apixaban (ELIQUIS) 2.5 MG TABS tablet Take 1 tablet (2.5 mg total) by mouth 2 (two) times  daily. 03/04/22  Yes Mercy Riding, MD  diphenoxylate-atropine (LOMOTIL) 2.5-0.025 MG tablet Take 1 tablet by mouth 3 (three) times daily as needed for diarrhea or loose stools. 02/20/22  Yes [provider]  epoetin alfa-epbx (RETACRIT) 2000 UNIT/ML injection Inject 2,000 Units into the vein See admin instructions. Every 6 weeks.   Yes [provider]  finasteride (PROSCAR) 5 MG tablet Take 5 mg by mouth daily as needed (retention).  06/26/19  Yes [provider]  folic acid (FOLVITE) 1 MG tablet Take 1 mg by mouth daily.   Yes [provider]  ketotifen (ZADITOR) 0.025 % ophthalmic solution Place 1 drop into both eyes 2 (two) times daily as needed (itchy eyes).    Yes [provider]  leflunomide (ARAVA) 20 MG tablet Take 20 mg by mouth daily.   Yes [provider]  levothyroxine (SYNTHROID) 88 MCG tablet Take 88 mcg by mouth every morning. 02/06/22  Yes [provider]  Multiple Vitamin (MULTIVITAMIN WITH MINERALS) TABS tablet Take 1 tablet by mouth daily.   Yes [provider]  pantoprazole (PROTONIX) 40 MG tablet Take 40 mg by mouth daily.   Yes [provider]  potassium chloride SA (KLOR-CON) 20 MEQ tablet Take 1 tablet (20 mEq total) by mouth daily. NEED OV. Patient taking differently: Take 20 mEq by mouth daily. 03/24/21  Yes Eileen Stanford, PA-C  predniSONE (DELTASONE) 5 MG tablet Take 5 mg by mouth daily with breakfast.    Yes [provider]  tamsulosin (FLOMAX) 0.4 MG CAPS capsule Take 0.4 mg by mouth daily.   Yes [provider]  torsemide (DEMADEX) 20 MG tablet Take 40 mg by mouth daily. 12/31/20  Yes [provider]  traMADol (ULTRAM) 50 MG tablet Take 50 mg by mouth every 6 (six) hours as needed for moderate pain.   Yes [provider]  pantoprazole (PROTONIX) 40 MG tablet Take 1 tablet (40 mg total) by mouth 2 (two) times daily before a meal for 30 days, THEN 1 tablet (40 mg total) daily. Patient not taking: Reported on 02/28/2022 02/25/22 05/26/22  Mercy Riding, MD    Scheduled Meds:  levothyroxine  88 mcg Oral q morning   pantoprazole (PROTONIX) IV  40 mg Intravenous Q12H   Continuous Infusions: PRN Meds:.acetaminophen **OR** acetaminophen  Allergies as of 02/28/2022   (No Known Allergies)    Family History  Problem Relation Age of Onset   Other Mother        NO HEALTH PROBLEMS   Heart disease Father    Arthritis Father    Heart Problems Sister        RELATED TO A MVA   Suicidality Brother    Other Sister        3 SISTERS IN GOOD HEALTH   Other Brother        2 BROTHERS IN GOOD HEALTH   Other Daughter        2 IN GOOD HEALTH    Social  History   Socioeconomic History   Marital status: Widowed    Spouse name: Not on file   Number of children: 2   Years of education: Not on file   Highest education level: Not on file  Occupational History   Occupation: Retired Market researcher at [employer] Use   Smoking status: Former    Packs/day: 0.50    Years: 10.00    Total pack years: 5.00    Types: Cigarettes  Start date: 01/16/1974    Quit date: 09/05/1983    Years since quitting: 38.5   Smokeless tobacco: Former    Types: Nurse, children's Use: Never used  Substance and Sexual Activity   Alcohol use: Not Currently    Alcohol/week: 0.0 standard drinks of alcohol   Drug use: No   Sexual activity: Not on file  Other Topics Concern   Not on file  Social History Narrative   Not on file   Social Determinants of Health   Financial Resource Strain: Not on file  Food Insecurity: Not on file  Transportation Needs: Not on file  Physical Activity: Not on file  Stress: Not on file  Social Connections: Not on file  Intimate Partner Violence: Not on file    Review of Systems: Review of Systems  Constitutional:  Negative for chills and fever.  HENT:  Negative for sore throat.   Eyes:  Negative for discharge and redness.  Respiratory:  Positive for shortness of breath. Negative for wheezing and stridor.   Cardiovascular:  Negative for chest pain and palpitations.  Gastrointestinal:  Positive for abdominal pain (Dull, RUQ, not worsening) and melena. Negative for blood in stool, constipation, diarrhea, heartburn, nausea and vomiting.  Genitourinary:  Negative for dysuria and urgency.  Musculoskeletal:  Negative for falls and joint pain.  Skin:  Negative for itching and rash.  Neurological:  Negative for seizures and loss of consciousness.  Endo/Heme/Allergies:  Negative for environmental allergies and polydipsia.  Psychiatric/Behavioral:  Negative for memory loss. The patient does not have insomnia.      Physical Exam:  Physical Exam Constitutional:      Appearance: Normal appearance.  HENT:     Head: Normocephalic and atraumatic.     Right Ear: External ear normal.     Left Ear: External ear normal.     Nose: Nose normal.     Mouth/Throat:     Mouth: Mucous membranes are moist.     Pharynx: Oropharynx is clear.  Eyes:     General: No scleral icterus.    Extraocular Movements: Extraocular movements intact.     Comments: Conjunctival pallor  Cardiovascular:     Rate and Rhythm: Normal rate and regular rhythm.     Pulses: Normal pulses.     Heart sounds: Normal heart sounds.  Pulmonary:     Effort: Pulmonary effort is normal.     Breath sounds: Normal breath sounds.  Abdominal:     General: Bowel sounds are normal. There is no distension.     Palpations: Abdomen is soft.     Tenderness: There is no abdominal tenderness. There is no guarding.  Musculoskeletal:     Cervical back: Normal range of motion and neck supple.     Right lower leg: No edema.     Left lower leg: No edema.  Skin:    General: Skin is warm and dry.  Neurological:     General: No focal deficit present.     Mental Status: He is alert and oriented to person, place, and time.  Psychiatric:        Mood and Affect: Mood normal.        Behavior: Behavior normal.      Vital signs: Vitals:   03/01/22 0609 03/01/22 0921  BP: 94/78 107/77  Pulse: 70 91  Resp: 16 16  Temp: 97.9 F (36.6 C) 98.2 F (36.8 C)  SpO2: 100% 99%   Last BM Date :  02/28/22    GI:  Lab Results: Recent Labs    02/28/22 1650 02/28/22 1724 02/28/22 2011 03/01/22 0630  WBC 8.3  --  8.3 7.0  HGB 8.2* 8.8* 7.3* 7.7*  HCT 25.9* 26.0* 23.2* 24.1*  PLT 94*  --  90* 142*   BMET Recent Labs    02/28/22 1650 02/28/22 1724 03/01/22 0630  NA 139 137 142  K 3.8 3.7 3.9  CL 106 104 109  CO2 24  --  24  GLUCOSE 100* 101* 88  BUN 33* 32* 34*  CREATININE 2.34* 2.80* 2.13*  CALCIUM 8.2*  --  8.2*   LFT Recent Labs    03/01/22 0630  PROT  5.5*  ALBUMIN 3.4*  AST 25  ALT 17  ALKPHOS 42  BILITOT 1.2   PT/INR Recent Labs    02/28/22 2239  LABPROT 15.6*  INR 1.3*     Studies/Results: DG Chest Portable 1 View  Result Date: 02/28/2022 CLINICAL DATA:  Shortness of breath EXAM: PORTABLE CHEST 1 VIEW COMPARISON:  09/22/2019 FINDINGS: Right shoulder replacement. Post sternotomy changes and valve prosthesis. Mild cardiomegaly with aortic atherosclerosis. No edema or pleural effusion. IMPRESSION: No active disease. Mild cardiomegaly without edema or acute airspace disease Electronically Signed   By: Donavan Foil M.D.   On: 02/28/2022 18:46   CT ABDOMEN PELVIS WO CONTRAST  Result Date: 02/28/2022 CLINICAL DATA:  Recently discharged after having bloody diarrhea. Recurrent today. EXAM: CT ABDOMEN AND PELVIS WITHOUT CONTRAST TECHNIQUE: Multidetector CT imaging of the abdomen and pelvis was performed following the standard protocol without IV contrast. RADIATION DOSE REDUCTION: This exam was performed according to the departmental dose-optimization program which includes automated exposure control, adjustment of the mA and/or kV according to patient size and/or use of iterative reconstruction technique. COMPARISON:  06/13/2019 abdominopelvic CT from [facility]. FINDINGS: Lower chest: Bibasilar scarring. Emphysema. Status post TAVR. Cardiomegaly. Hepatobiliary: Artifact degradation secondary to EKG wires and leads throughout. Subcentimeter right hepatic lobe cyst. Normal gallbladder, without biliary ductal dilatation. Pancreas: Pancreatic atrophy. No duct dilatation or acute inflammation. Spleen: Normal in size, without focal abnormality. Adrenals/Urinary Tract: Normal adrenal glands. No renal calculi or hydronephrosis. Mild renal cortical thinning bilaterally. Fluid density exophytic right renal lesions of maximally 2.8 cm are likely cysts . In the absence of clinically indicated signs/symptoms require(s) no independent follow-up. The  ureters are difficult to follow secondary to beam hardening artifact from lumbar spine hardware. The bladder is decompressed, without stone. Stomach/Bowel: Normal stomach, without wall thickening. Transverse duodenal diverticulum. Otherwise normal small bowel. Bowel is suboptimally evaluated secondary to lack of IV and paucity of enteric contrast. Suspect mild rectal wall thickening on 66/2. Normal terminal ileum. Appendix not visualized. Vascular/Lymphatic: Aortic atherosclerosis. No abdominopelvic adenopathy. Reproductive: Normal prostate. Other: No significant free fluid.  No free intraperitoneal air. Musculoskeletal: Defect in the left iliac is likely bone harvest site given lumbar spine fixation. Trans pedicle screw fixation at L3-5 with interbody fusion material at L4-5. Degenerate disc disease at the remainder of the lumbosacral spine with convex left lumbar spine curvature. IMPRESSION: 1. Multifactorial degradation, including lack of IV contrast, little if any enteric contrast, and support apparatus artifact. 2. Suspect proctitis, with distribution favoring infection. Not a typical distribution for ischemic bowel despite the clinical history of bloody diarrhea. 3.  Aortic Atherosclerosis (ICD10-I70.0). Electronically Signed   By: Abigail Miyamoto M.D.   On: 02/28/2022 17:59    Impression: Recurrent GI bleed, melena - Hemoglobin 7.7, CV 75.8, no leukocytosis, platelets  142 - BUN 34, creatinine 2.13, stable from recent labs - On IV Protonix, clear liquid diet - EGD 02/24/2022 - Esophageal dysmotility, erosive gastropathy with no stigmata of recent bleeding, small hiatal hernia, small amount of phytobezoar in the stomach.  Plan: Patient with recurrence of dark tarry stools following recent admission for the same complaint.  Currently not on Eliquis, denies NSAIDs.  Recent EGD did not identify source of bleeding.  Consider capsule endoscopy as next step. Will discuss with Dr. Alessandra Bevels. Continue daily CBC  and transfuse as needed to maintain HGB > 7. IV Protonix '40mg'$  BID. Continue supportive care. Clear liquids okay for now. [facility] will follow.     LOS: 1 day   Angelique Holm  PA-C 03/01/2022, 11:03 AM  Contact #  [phone]

## 2022-03-01 NOTE — TOC Initial Note (Signed)
Transition of Care [facility]) - Initial/Assessment Note    Patient Details  Name: Joshua Hudson MRN: [id number] Date of Birth: [birth year]  Transition of Care [facility]) CM/SW Contact:    Daishia Fetterly, Marjie Skiff, RN Phone Number: 03/01/2022, 1:52 PM  Clinical Narrative:                   Expected Discharge Plan: Home/Self Care Barriers to Discharge: Continued Medical Work up   Patient Goals and CMS Choice Patient states their goals for this hospitalization and ongoing recovery are:: Home      Expected Discharge Plan and Services Expected Discharge Plan: Home/Self Care   Discharge Planning Services: CM Consult   Living arrangements for the past 2 months: Single Family Home     Prior Living Arrangements/Services Living arrangements for the past 2 months: Single Family Home Lives with:: Self Patient language and need for interpreter reviewed:: Yes        Need for Family Participation in Patient Care: No (Comment) Care giver support system in place?: Yes (comment)   Criminal Activity/Legal Involvement Pertinent to Current Situation/Hospitalization: No - Comment as needed  Activities of Daily Living Home Assistive Devices/Equipment: None ADL Screening (condition at time of admission) Patient's cognitive ability adequate to safely complete daily activities?: Yes Is the patient deaf or have difficulty hearing?: No Does the patient have difficulty seeing, even when wearing glasses/contacts?: No Does the patient have difficulty concentrating, remembering, or making decisions?: No Patient able to express need for assistance with ADLs?: Yes Does the patient have difficulty dressing or bathing?: No Independently performs ADLs?: Yes (appropriate for developmental age) Does the patient have difficulty walking or climbing stairs?: No Weakness of Legs: Both Weakness of Arms/Hands: Both   Emotional Assessment       Orientation: : Oriented to Self, Oriented to Place, Oriented to  Time,  Oriented to Situation Alcohol / Substance Use: Not Applicable Psych Involvement: No (comment)  Admission diagnosis:  Melena [K92.1] GI bleed [K92.2] Generalized weakness [R53.1] Fatigue, unspecified type [R53.83] Patient Active Problem List   Diagnosis Date Noted   GI bleed 02/28/2022   Hypokalemia 02/24/2022   Fatigue 02/24/2022   Physical deconditioning 02/23/2022   GIB (gastrointestinal bleeding) 02/22/2022   Stage 3b chronic kidney disease (CKD) ([place]) 02/22/2022   Hypothyroidism 02/22/2022   Anemia of chronic disease 07/26/2020   Monocular vision loss 03/23/2020   Atrial flutter ([place]) 54/00/8676   Acute systolic (congestive) heart failure ([place]) 09/22/2019   Elevated troponin 09/22/2019   Microcytic anemia 09/22/2019   S/P shoulder replacement, right 07/18/2019   Thrombocytopenia ([place]) 05/20/2019   Other pancytopenia ([place]) 05/06/2019   Protein-calorie malnutrition, severe 04/30/2019   FTT (failure to thrive) in adult 04/28/2019   GERD (gastroesophageal reflux disease)    Esophageal stricture    History of esophageal stricture    Odynophagia    Hypertension    Rheumatoid arthritis ([place])    S/P valve-in-valve TAVR    Prosthetic valve dysfunction    Severe aortic regurgitation 04/02/2019   Lower extremity edema 03/24/2019   Mitral regurgitation 05/16/2016   Paroxysmal atrial fibrillation ([place]) 05/10/2015   Hemolytic anemia ([place]) 02/25/2014   Chronic combined CHF 02/25/2014   Dysphagia 02/25/2014   S/P aortic valve replacement with bioprosthetic valve 01/16/2008   PCP:  Lavone Orn, MD Pharmacy:   [facility] [phone] - [place], [place] - [address] [place] [address]  Phone: [phone] Fax: [phone]     Social Determinants of Health (SDOH) Interventions    Readmission Risk Interventions    03/01/2022    1:46 PM  Readmission Risk Prevention Plan  Transportation Screening  Complete  PCP or Specialist Appt within 3-5 Days Complete  HRI or [place] Complete  Social Work Consult for [place] Planning/Counseling Complete  Palliative Care Screening Not Applicable  Medication Review Press photographer) Complete

## 2022-03-01 NOTE — Progress Notes (Signed)
PROGRESS NOTE    Joshua Hudson  XQJ:[id number] DOB: 15-Apr-1935 DOA: 02/28/2022 PCP: Lavone Orn, MD   Brief Narrative: 87 year old with past medical history significant for hypertension, chronic combined CHF, paroxysmal A-fib on Eliquis, CKD 3B, rheumatoid arthritis, bioprosthetic AV, AICD, GERD, hypothyroidism who presents complaining of rectal bleeding, melena. Recent admission from 6/21 to 6/24 due to GI bleed, endoscopy done in 6/23 showed abnormal esophageal motility, erosive gastropathy with no stigmata of recent bleeding, GI recommended PPI for 1 month.  His Eliquis was on hold for 1 week.  He has not restarted Eliquis yet.  He received IV iron prior to discharge. T abdomen and pelvis without contrast: Showed suspect proctitis.   GI has been consulted and plan is for capsule endoscopy.     Assessment & Plan:   Principal Problem:   GI bleed Active Problems:   GERD (gastroesophageal reflux disease)   Atrial flutter (HCC)   Elevated troponin   Microcytic anemia   Hypothyroidism  1-Recurrent GI bleed: Melena -Continue with IV Protonix. -Hb at 7.7, he continued to have melena.  With his history of cardiac disease plan to proceed with 1 unit of packed red blood cells to keep hemoglobin above 8 -Continue to cycle hemoglobin. -We will give IV Lasix after blood transfusion -Plan to proceed with capsule endoscopy  2-Microcytic anemia: Iron deficiency anemia, acute blood loss in the setting of GI bleed: Plan to transfuse 1 unit to keep hemoglobin above 8.  Continue to have melena  3-Elevated troponin, the setting of demand ischemia: Denies chest pain. Keep hemoglobin above 8.  Prolonged QT: Avoid prolonged QT medication. Replete electrolytes as needed. Mg 2.1  Chronic diastolic heart failure, history of aortic valve replacement: Hold torsemide due to GI bleed.  He will receive IV Lasix with blood transfusion  Chronic A fib:  Resume amiodarone.  Holding Eliquis.    GERD; PPI  Hypothyroidism; continue with synthroid.   RA; on chronic low dose prednisone resume. Hold Avara due to thrombocytopenia,  BPH; resume flomax. Hold finasteride to avoid further hypotension.   AKI on CKD stage IIIb;  Prior Cr 1.7---1.8 Hold diuretics.   Estimated body mass index is 19.06 kg/m as calculated from the following:   Height as of 02/22/22: '5\' 3"'$  (1.6 m).   Weight as of 02/25/22: 48.8 kg.   DVT prophylaxis: SCD Code Status: DNR Family Communication: family at bedside.  Disposition Plan:  Status is: Inpatient Remains inpatient appropriate because: management of GI bleed.     Consultants:  GI  Procedures:  Capsule endoscopy   Antimicrobials:    Subjective: He report abdominal pain. Mid abdomen. He just had another Black BM, less black color.    Objective: Vitals:   02/28/22 2353 03/01/22 0111 03/01/22 0609 03/01/22 0921  BP: 1'06/82 99/67 94/78 '$ 107/77  Pulse: 76 (!) 57 70 91  Resp: '16 18 16 16  '$ Temp: (!) 97.5 F (36.4 C) 98.2 F (36.8 C) 97.9 F (36.6 C) 98.2 F (36.8 C)  TempSrc: Oral Oral Oral Oral  SpO2: 100% 100% 100% 99%    Intake/Output Summary (Last 24 hours) at 03/01/2022 1300 Last data filed at 03/01/2022 1018 Gross per 24 hour  Intake 720 ml  Output --  Net 720 ml   There were no vitals filed for this visit.  Examination:  General exam: Appears calm and comfortable  Respiratory system: Clear to auscultation. Respiratory effort normal. Cardiovascular system: S1 & S2 heard, RRR. Positive murmur Gastrointestinal system: Abdomen is nondistended,  soft and nontender. No organomegaly or masses felt. Normal bowel sounds heard. Central nervous system: Alert and oriented.  Extremities: Symmetric 5 x 5 power.   Data Reviewed: I have personally reviewed following labs and imaging studies  CBC: Recent Labs  Lab 02/24/22 0456 02/25/22 0454 02/28/22 1650 02/28/22 1724 02/28/22 2011 03/01/22 0630  WBC 7.8 8.0 8.3  --  8.3  7.0  HGB 8.8* 8.8* 8.2* 8.8* 7.3* 7.7*  HCT 28.5* 28.5* 25.9* 26.0* 23.2* 24.1*  MCV 77.0* 77.0* 75.5*  --  75.3* 75.8*  PLT 136* 144* 94*  --  90* 267*   Basic Metabolic Panel: Recent Labs  Lab 02/23/22 0204 02/24/22 0456 02/25/22 0454 02/28/22 1650 02/28/22 1724 02/28/22 2109 03/01/22 0630  NA 145 145 144 139 137  --  142  K 3.7 3.3* 4.7 3.8 3.7  --  3.9  CL 114* 110 110 106 104  --  109  CO2 '24 26 27 24  '$ --   --  24  GLUCOSE 68* 74 94 100* 101*  --  88  BUN 31* 31* 36* 33* 32*  --  34*  CREATININE 1.74* 2.09* 1.99* 2.34* 2.80*  --  2.13*  CALCIUM 8.4* 8.4* 9.0 8.2*  --   --  8.2*  MG  --  2.2 2.2  --   --  1.9 2.1  PHOS  --  3.4 2.5  --   --   --  3.5   GFR: Estimated Creatinine Clearance: 17.5 mL/min (A) (by C-G formula based on SCr of 2.13 mg/dL (H)). Liver Function Tests: Recent Labs  Lab 02/23/22 0204 02/24/22 0456 02/25/22 0454 02/28/22 1650 03/01/22 0630  AST 28  --   --  31 25  ALT 15  --   --  19 17  ALKPHOS 41  --   --  47 42  BILITOT 0.9  --   --  0.7 1.2  PROT 5.5*  --   --  5.9* 5.5*  ALBUMIN 3.2* 3.6 3.6 3.6 3.4*   No results for input(s): "LIPASE", "AMYLASE" in the last 168 hours. No results for input(s): "AMMONIA" in the last 168 hours. Coagulation Profile: Recent Labs  Lab 02/28/22 2239  INR 1.3*   Cardiac Enzymes: No results for input(s): "CKTOTAL", "CKMB", "CKMBINDEX", "TROPONINI" in the last 168 hours. BNP (last 3 results) No results for input(s): "PROBNP" in the last 8760 hours. HbA1C: No results for input(s): "HGBA1C" in the last 72 hours. CBG: No results for input(s): "GLUCAP" in the last 168 hours. Lipid Profile: No results for input(s): "CHOL", "HDL", "LDLCALC", "TRIG", "CHOLHDL", "LDLDIRECT" in the last 72 hours. Thyroid Function Tests: No results for input(s): "TSH", "T4TOTAL", "FREET4", "T3FREE", "THYROIDAB" in the last 72 hours. Anemia Panel: No results for input(s): "VITAMINB12", "FOLATE", "FERRITIN", "TIBC", "IRON",  "RETICCTPCT" in the last 72 hours. Sepsis Labs: Recent Labs  Lab 02/28/22 2109 02/28/22 2239  LATICACIDVEN 1.3 0.9    No results found for this or any previous visit (from the past 240 hour(s)).       Radiology Studies: DG Chest Portable 1 View  Result Date: 02/28/2022 CLINICAL DATA:  Shortness of breath EXAM: PORTABLE CHEST 1 VIEW COMPARISON:  09/22/2019 FINDINGS: Right shoulder replacement. Post sternotomy changes and valve prosthesis. Mild cardiomegaly with aortic atherosclerosis. No edema or pleural effusion. IMPRESSION: No active disease. Mild cardiomegaly without edema or acute airspace disease Electronically Signed   By: Donavan Foil M.D.   On: 02/28/2022 18:46   CT ABDOMEN PELVIS  WO CONTRAST  Result Date: 02/28/2022 CLINICAL DATA:  Recently discharged after having bloody diarrhea. Recurrent today. EXAM: CT ABDOMEN AND PELVIS WITHOUT CONTRAST TECHNIQUE: Multidetector CT imaging of the abdomen and pelvis was performed following the standard protocol without IV contrast. RADIATION DOSE REDUCTION: This exam was performed according to the departmental dose-optimization program which includes automated exposure control, adjustment of the mA and/or kV according to patient size and/or use of iterative reconstruction technique. COMPARISON:  06/13/2019 abdominopelvic CT from [facility]. FINDINGS: Lower chest: Bibasilar scarring. Emphysema. Status post TAVR. Cardiomegaly. Hepatobiliary: Artifact degradation secondary to EKG wires and leads throughout. Subcentimeter right hepatic lobe cyst. Normal gallbladder, without biliary ductal dilatation. Pancreas: Pancreatic atrophy. No duct dilatation or acute inflammation. Spleen: Normal in size, without focal abnormality. Adrenals/Urinary Tract: Normal adrenal glands. No renal calculi or hydronephrosis. Mild renal cortical thinning bilaterally. Fluid density exophytic right renal lesions of maximally 2.8 cm are likely cysts . In the absence of  clinically indicated signs/symptoms require(s) no independent follow-up. The ureters are difficult to follow secondary to beam hardening artifact from lumbar spine hardware. The bladder is decompressed, without stone. Stomach/Bowel: Normal stomach, without wall thickening. Transverse duodenal diverticulum. Otherwise normal small bowel. Bowel is suboptimally evaluated secondary to lack of IV and paucity of enteric contrast. Suspect mild rectal wall thickening on 66/2. Normal terminal ileum. Appendix not visualized. Vascular/Lymphatic: Aortic atherosclerosis. No abdominopelvic adenopathy. Reproductive: Normal prostate. Other: No significant free fluid.  No free intraperitoneal air. Musculoskeletal: Defect in the left iliac is likely bone harvest site given lumbar spine fixation. Trans pedicle screw fixation at L3-5 with interbody fusion material at L4-5. Degenerate disc disease at the remainder of the lumbosacral spine with convex left lumbar spine curvature. IMPRESSION: 1. Multifactorial degradation, including lack of IV contrast, little if any enteric contrast, and support apparatus artifact. 2. Suspect proctitis, with distribution favoring infection. Not a typical distribution for ischemic bowel despite the clinical history of bloody diarrhea. 3.  Aortic Atherosclerosis (ICD10-I70.0). Electronically Signed   By: Abigail Miyamoto M.D.   On: 02/28/2022 17:59        Scheduled Meds:  sodium chloride   Intravenous Once   amiodarone  200 mg Oral Daily   folic acid  1 mg Oral Daily   furosemide  20 mg Intravenous Once   levothyroxine  88 mcg Oral q morning   pantoprazole (PROTONIX) IV  40 mg Intravenous Q12H   predniSONE  5 mg Oral Q breakfast   tamsulosin  0.4 mg Oral Daily   Continuous Infusions:   LOS: 1 day    Time spent: 45 minutes    Brandan Glauber A Sparrow Sanzo, MD [facility]   If 7PM-7AM, please contact night-coverage [email or web address]  03/01/2022, 1:00 PM

## 2022-03-01 NOTE — Progress Notes (Signed)
Pt swallowed capsule endoscopy with trouble or difficulty. Instructions given to patient and nurse regarding diet and capsule box to be removed at 0245 and picked up by endo nurse in the morning. Pt and nurse voiced understanding.

## 2022-03-02 ENCOUNTER — Inpatient Hospital Stay (HOSPITAL_COMMUNITY): Payer: Medicare PPO

## 2022-03-02 DIAGNOSIS — K922 Gastrointestinal hemorrhage, unspecified: Secondary | ICD-10-CM | POA: Diagnosis not present

## 2022-03-02 DIAGNOSIS — R778 Other specified abnormalities of plasma proteins: Secondary | ICD-10-CM | POA: Diagnosis not present

## 2022-03-02 DIAGNOSIS — I4892 Unspecified atrial flutter: Secondary | ICD-10-CM | POA: Diagnosis not present

## 2022-03-02 LAB — CBC
HCT: 28.4 % — ABNORMAL LOW (ref 39.0–52.0)
HCT: 29.5 % — ABNORMAL LOW (ref 39.0–52.0)
HCT: 32.1 % — ABNORMAL LOW (ref 39.0–52.0)
HCT: 29.7 % — ABNORMAL LOW (ref 39.0–52.0)
Hemoglobin: 9 g/dL — ABNORMAL LOW (ref 13.0–17.0)
Hemoglobin: 9.2 g/dL — ABNORMAL LOW (ref 13.0–17.0)
Hemoglobin: 9.7 g/dL — ABNORMAL LOW (ref 13.0–17.0)
Hemoglobin: 9.2 g/dL — ABNORMAL LOW (ref 13.0–17.0)
MCH: 24.7 pg — ABNORMAL LOW (ref 26.0–34.0)
MCH: 24.3 pg — ABNORMAL LOW (ref 26.0–34.0)
MCH: 24.4 pg — ABNORMAL LOW (ref 26.0–34.0)
MCH: 24.2 pg — ABNORMAL LOW (ref 26.0–34.0)
MCHC: 31.7 g/dL (ref 30.0–36.0)
MCHC: 31.2 g/dL (ref 30.0–36.0)
MCHC: 30.2 g/dL (ref 30.0–36.0)
MCHC: 31 g/dL (ref 30.0–36.0)
MCV: 77.8 fL — ABNORMAL LOW (ref 80.0–100.0)
MCV: 78 fL — ABNORMAL LOW (ref 80.0–100.0)
MCV: 80.7 fL (ref 80.0–100.0)
MCV: 78.2 fL — ABNORMAL LOW (ref 80.0–100.0)
Platelets: 90 10*3/uL — ABNORMAL LOW (ref 150–400)
Platelets: 92 10*3/uL — ABNORMAL LOW (ref 150–400)
Platelets: 85 10*3/uL — ABNORMAL LOW (ref 150–400)
Platelets: 102 10*3/uL — ABNORMAL LOW (ref 150–400)
RBC: 3.65 MIL/uL — ABNORMAL LOW (ref 4.22–5.81)
RBC: 3.78 MIL/uL — ABNORMAL LOW (ref 4.22–5.81)
RBC: 3.98 MIL/uL — ABNORMAL LOW (ref 4.22–5.81)
RBC: 3.8 MIL/uL — ABNORMAL LOW (ref 4.22–5.81)
RDW: 21.6 % — ABNORMAL HIGH (ref 11.5–15.5)
RDW: 21.5 % — ABNORMAL HIGH (ref 11.5–15.5)
RDW: 21.8 % — ABNORMAL HIGH (ref 11.5–15.5)
RDW: 21.4 % — ABNORMAL HIGH (ref 11.5–15.5)
WBC: 6.9 10*3/uL (ref 4.0–10.5)
WBC: 6.8 10*3/uL (ref 4.0–10.5)
WBC: 7.6 10*3/uL (ref 4.0–10.5)
WBC: 8.4 10*3/uL (ref 4.0–10.5)
nRBC: 0.6 % — ABNORMAL HIGH (ref 0.0–0.2)
nRBC: 0.7 % — ABNORMAL HIGH (ref 0.0–0.2)
nRBC: 0.5 % — ABNORMAL HIGH (ref 0.0–0.2)
nRBC: 0.5 % — ABNORMAL HIGH (ref 0.0–0.2)

## 2022-03-02 LAB — BASIC METABOLIC PANEL
Anion gap: 9 (ref 5–15)
Anion gap: 8 (ref 5–15)
BUN: 27 mg/dL — ABNORMAL HIGH (ref 8–23)
BUN: 32 mg/dL — ABNORMAL HIGH (ref 8–23)
CO2: 22 mmol/L (ref 22–32)
CO2: 23 mmol/L (ref 22–32)
Calcium: 8.3 mg/dL — ABNORMAL LOW (ref 8.9–10.3)
Calcium: 8.3 mg/dL — ABNORMAL LOW (ref 8.9–10.3)
Chloride: 110 mmol/L (ref 98–111)
Chloride: 107 mmol/L (ref 98–111)
Creatinine, Ser: 1.69 mg/dL — ABNORMAL HIGH (ref 0.61–1.24)
Creatinine, Ser: 1.82 mg/dL — ABNORMAL HIGH (ref 0.61–1.24)
GFR, Estimated: 39 mL/min — ABNORMAL LOW (ref >60)
GFR, Estimated: 36 mL/min — ABNORMAL LOW (ref >60)
Glucose, Bld: 81 mg/dL (ref 70–99)
Glucose, Bld: 126 mg/dL — ABNORMAL HIGH (ref 70–99)
Potassium: 4.4 mmol/L (ref 3.5–5.1)
Potassium: 4.1 mmol/L (ref 3.5–5.1)
Sodium: 141 mmol/L (ref 135–145)
Sodium: 138 mmol/L (ref 135–145)

## 2022-03-02 LAB — MAGNESIUM: Magnesium: 2.1 mg/dL (ref 1.7–2.4)

## 2022-03-02 LAB — TYPE AND SCREEN
ABO/RH(D): A POS
Antibody Screen: NEGATIVE
Unit division: 0

## 2022-03-02 LAB — BPAM RBC
Blood Product Expiration Date: 202307182359
ISSUE DATE / TIME: 202306281336
Unit Type and Rh: 6200

## 2022-03-02 MED ORDER — PEG 3350-KCL-NA BICARB-NACL 420 G PO SOLR
4000.0000 mL | Freq: Once | ORAL | Status: AC
Start: 2022-03-02 — End: 2022-03-02
  Administered 2022-03-02: 4000 mL via ORAL
  Filled 2022-03-02: qty 4000

## 2022-03-02 MED ORDER — AMIODARONE HCL IN DEXTROSE 360-4.14 MG/200ML-% IV SOLN
30.0000 mg/h | INTRAVENOUS | Status: DC
  Administered 2022-03-02 – 2022-03-04 (×4): 30 mg/h via INTRAVENOUS
  Filled 2022-03-02 (×5): qty 200

## 2022-03-02 MED ORDER — AMIODARONE HCL IN DEXTROSE 360-4.14 MG/200ML-% IV SOLN
60.0000 mg/h | INTRAVENOUS | Status: AC
  Administered 2022-03-02: 60 mg/h via INTRAVENOUS
  Filled 2022-03-02 (×2): qty 200

## 2022-03-02 MED ORDER — LACTATED RINGERS IV SOLN: INTRAVENOUS | Status: DC

## 2022-03-02 MED ORDER — MAGNESIUM SULFATE 2 GM/50ML IV SOLN
2.0000 g | Freq: Once | INTRAVENOUS | Status: AC
  Administered 2022-03-02: 2 g via INTRAVENOUS
  Filled 2022-03-02: qty 50

## 2022-03-02 NOTE — Progress Notes (Signed)
   03/02/22 1324  Assess: MEWS Score  Temp 97.8 F (36.6 C)  BP 95/72  MAP (mmHg) 81  Pulse Rate (!) 112  SpO2 100 %  O2 Device Room Air  Assess: MEWS Score  MEWS Temp 0  MEWS Systolic 1  MEWS Pulse 2  MEWS RR 0  MEWS LOC 0  MEWS Score 3  MEWS Score Color Yellow  Assess: if the MEWS score is Yellow or Red  Were vital signs taken at a resting state? Yes  Focused Assessment No change from prior assessment  Does the patient meet 2 or more of the SIRS criteria? No  MEWS guidelines implemented *See Row Information* Yes  Treat  MEWS Interventions Escalated (See documentation below);Administered scheduled meds/treatments  Pain Scale 0-10  Pain Score 0  Take Vital Signs  Increase Vital Sign Frequency  Yellow: Q 2hr X 2 then Q 4hr X 2, if remains yellow, continue Q 4hrs  Escalate  MEWS: Escalate Yellow: discuss with charge nurse/RN and consider discussing with provider and RRT  Notify: Charge Nurse/RN  Name of Charge Nurse/RN Notified [place], RN  Date Charge Nurse/RN Notified 03/02/22  Time Charge Nurse/RN Notified 1347  Notify: Provider  Provider Name/Title Dr. Tyrell Antonio  Date Provider Notified 03/02/22  Time Provider Notified 2831  Method of Notification Page  Notification Reason Change in status  Provider response See new orders  Date of Provider Response 03/02/22  Time of Provider Response 1340  Assess: SIRS CRITERIA  SIRS Temperature  0  SIRS Pulse 1  SIRS Respirations  0  SIRS WBC 0  SIRS Score Sum  1

## 2022-03-02 NOTE — Consult Note (Signed)
Cardiology Consultation:   Patient ID: Joshua Hudson MRN: [id number]; DOB: 1934/11/07  Admit date: 02/28/2022 Date of Consult: 03/02/2022  PCP:  Joshua Orn, MD   [facility] Providers Cardiologist:  [name], MD      EP: Dr. [name]   Patient Profile:   Joshua Hudson is a 87 y.o. male with a hx of RA, aortic stenosis status post TAVR (04/2019 26 mm Edwards SAPIEN 3) and typical atrial flutter status post RFA 10/2019 admitted with GI bleed who is being seen 03/02/2022 for the evaluation of atrial fibrillation with RVR at the request of Dr. Tyrell Antonio.  History of Present Illness:   Joshua Hudson was admitted 02/28/2022 with recurrent GI bleed.  He was recently admitted 6/21 through 6/24 with upper GI bleed due to erosive gastropathy but no stigmata of recent bleeding.  GI recommended twice daily PPI and his Eliquis was held for 1 week.  On discharge he continued to have loose, black stools with associated weakness and fatigue.  He was found to have a hemoglobin of 8.2 down from 8.8 on discharge and Hemoccult was positive.  High-sensitivity troponin was 49--> 48.  Anticoagulation continues to be held.  He has been on oral amiodarone.  Cardiology was consulted for A-fib with RVR and hypotension.  There is also been some question about atrial flutter versus SVT.  Ventricular rates have been in the 130s and his last blood pressure was 95/72.  Echo 02/24/2021 revealed LVEF 55 to 60% with severe left atrial dilatation and mild right atrial dilatation.  He had moderate TR.  The mean gradient across his TAVR valve was 6.5 mmHg and there is no significant regurgitation.  Joshua Hudson currently reports feeling short of breath.  Today he is felt a little lightheaded.  He continues to have episodes of loose stools.  It is not as dark as prior to admission.  He feels his heart fluttering but denies chest pain or pressure.  He has also noted lower extremity edema but denies orthopnea or PND.   Past  Medical History:  Diagnosis Date   Achalasia 06/12/2019   Noted on EGD   Aortic insufficiency 03/24/2019   AI of bioprosthetic AVR severe 4 plus   Chronic diastolic CHF (congestive heart failure) ([place]) 03/24/2019   Colon polyps    s/p diverticular perforation requiring 2-stage repair   Derangement of right shoulder joint    need replacing has no use of   Diverticulosis    DJD (degenerative joint disease), lumbosacral    Dysphagia    eats soft food   Esophageal stricture    GERD (gastroesophageal reflux disease)    Hemolytic anemia ([place])    History of blood transfusion given with 04-15-19 surgery   History of blood transfusion 07/18/2019   History of kidney stones    passed stones   Hypertension    Mitral regurgitation    moderate   Occipital neuralgia    Pancytopenia (HCC)    Postoperative atrial fibrillation ([place]) 05/10/2015   Prosthetic valve dysfunction    Rheumatoid arthritis (HCC)    s/o long term steroids  shoulders and hands   Rotator cuff arthropathy    right   S/P aortic valve replacement with bioprosthetic valve 01/16/2008   35m Edwards Magna perimount bovine pericardial tissue valve, model 3000   S/P valve-in-valve TAVR 04/15/2019   26 mm Edwards Sapien 3 Ultra transcatheter heart valve placed via percutaneous right transfemoral approach    SBE (subacute bacterial endocarditis)  prophylaxis candidate    for dental procedures   Schatzki's ring 06/12/2019   Narrowing, Noted on EGD   Severe aortic stenosis    S/P prosthetic valve replacement w 25 mm Edwards like science percardial tissue valve,Turner - 01/2008   Thrombocytopenia ([place])     Past Surgical History:  Procedure Laterality Date   A-FLUTTER ABLATION N/A 10/10/2019   Procedure: A-FLUTTER ABLATION;  Surgeon: Evans Lance, MD;  Location: [place] [facility];  Service: Cardiovascular;  Laterality: N/A;   APPENDECTOMY     BALLOON DILATION N/A 06/12/2019   Procedure: BALLOON DILATION;  Surgeon: Ronnette Juniper,  MD;  Location: [facility];  Service: Gastroenterology;  Laterality: N/A;   BOTOX INJECTION N/A 01/22/2018   Procedure: BOTOX INJECTION;  Surgeon: Ronnette Juniper, MD;  Location: [facility];  Service: Gastroenterology;  Laterality: N/A;   CARDIAC CATHETERIZATION     09   CARDIAC VALVE REPLACEMENT  01/2008   aortic valve replacement   CARDIOVERSION N/A 09/25/2019   Procedure: CARDIOVERSION;  Surgeon: Jerline Pain, MD;  Location: [place];  Service: Cardiovascular;  Laterality: N/A;   CARDIOVERSION N/A 12/25/2019   Procedure: CARDIOVERSION;  Surgeon: Lelon Perla, MD;  Location: [facility];  Service: Cardiovascular;  Laterality: N/A;   CATARACT EXTRACTION W/ INTRAOCULAR LENS  IMPLANT, BILATERAL     COLON RESECTION     diverticulitis    CORONARY ANGIOPLASTY     dental implants     permanent   ESOPHAGEAL MANOMETRY N/A 11/07/2017   Procedure: ESOPHAGEAL MANOMETRY (EM);  Surgeon: Ronnette Juniper, MD;  [facility];  Service: Gastroenterology;  Laterality: N/A;   ESOPHAGOGASTRODUODENOSCOPY N/A 02/24/2022   Procedure: ESOPHAGOGASTRODUODENOSCOPY (EGD);  Surgeon: Arta Silence, MD;  Location: [facility];  Service: Gastroenterology;  Laterality: N/A;   ESOPHAGOGASTRODUODENOSCOPY (EGD) WITH PROPOFOL N/A 01/22/2018   Procedure: ESOPHAGOGASTRODUODENOSCOPY (EGD) WITH PROPOFOL;  Surgeon: Ronnette Juniper, MD;  [facility];  Service: Gastroenterology;  Laterality: N/A;   ESOPHAGOGASTRODUODENOSCOPY (EGD) WITH PROPOFOL N/A 04/30/2019   Procedure: ESOPHAGOGASTRODUODENOSCOPY (EGD) WITH PROPOFOL;  Surgeon: Laurence Spates, MD;  Location: [place];  Service: Endoscopy;  Laterality: N/A;   ESOPHAGOGASTRODUODENOSCOPY (EGD) WITH PROPOFOL N/A 06/12/2019   Procedure: ESOPHAGOGASTRODUODENOSCOPY (EGD) WITH PROPOFOL;  Surgeon: Ronnette Juniper, MD;  [facility];  Service: Gastroenterology;  Laterality: N/A;  with botox injection   EXCISIONAL TOTAL SHOULDER ARTHROPLASTY WITH ANTIBIOTIC SPACER  Right 12/31/2018   Procedure: EXCISIONAL TOTAL SHOULDER ARTHROPLASTY WITH ANTIBIOTIC SPACER;  Surgeon: Netta Cedars, MD;  Location: [place];  Service: Orthopedics;  Laterality: Right;   GIVENS CAPSULE STUDY N/A 03/01/2022   Procedure: GIVENS CAPSULE STUDY;  Surgeon: Otis Brace, MD;  [facility];  Service: Gastroenterology;  Laterality: N/A;   HERNIA REPAIR     INTRAOPERATIVE TRANSTHORACIC ECHOCARDIOGRAM N/A 04/15/2019   Procedure: Intraoperative Transthoracic Echocardiogram;  Surgeon: Sherren Mocha, MD;  Location: [place];  Service: Open Heart Surgery;  Laterality: N/A;   IRRIGATION AND DEBRIDEMENT SHOULDER Right 11/20/2018    IRRIGATION AND DEBRIDEMENT SHOULDER WITH POLY EXCHANGE (Right Shoulder)   IRRIGATION AND DEBRIDEMENT SHOULDER Right 11/20/2018   Procedure: IRRIGATION AND DEBRIDEMENT SHOULDER WITH POLY EXCHANGE;  Surgeon: Netta Cedars, MD;  Location: [place];  Service: Orthopedics;  Laterality: Right;   LUMBAR LAMINECTOMY     x 2   REVERSE SHOULDER ARTHROPLASTY Right 03/01/2018   Procedure: RIGHT REVERSE SHOULDER ARTHROPLASTY;  Surgeon: Netta Cedars, MD;  Location: [place];  Service: Orthopedics;  Laterality: Right;   REVERSE SHOULDER ARTHROPLASTY Right 07/18/2019  Procedure: REVERSE TOTAL SHOULDER ARTHROPLASTY and removal of antiobotic spacer;  Surgeon: Netta Cedars, MD;  Location: [facility];  Service: Orthopedics;  Laterality: Right;  interscalene block   REVERSE SHOULDER ARTHROPLASTY Right 08/06/2019   Procedure: Reduction of dislocated reverse total shoulder and poly exchange;  Surgeon: Netta Cedars, MD;  Location: [facility];  Service: Orthopedics;  Laterality: Right;  need 1 hour   RIGHT/LEFT HEART CATH AND CORONARY ANGIOGRAPHY N/A 04/02/2019   Procedure: RIGHT/LEFT HEART CATH AND CORONARY ANGIOGRAPHY;  Surgeon: Leonie Man, MD;  Location: [place] [facility];  Service: Cardiovascular;  Laterality: N/A;   SAVORY DILATION N/A 01/22/2018   Procedure: SAVORY DILATION;  Surgeon:  Ronnette Juniper, MD;  [facility];  Service: Gastroenterology;  Laterality: N/A;   SAVORY DILATION N/A 04/30/2019   Procedure: SAVORY DILATION;  Surgeon: Laurence Spates, MD;  Location: [place];  Service: Endoscopy;  Laterality: N/A;  With fluro   SHOULDER HEMI-ARTHROPLASTY Right 06/14/2018   Procedure: RIGHT  REVERSE TOTAL SHOULDER OPEN POLY EXCHANGE;  Surgeon: Netta Cedars, MD;  Location: [place];  Service: Orthopedics;  Laterality: Right;   SUBMUCOSAL INJECTION  06/12/2019   Procedure: SUBMUCOSAL INJECTION;  Surgeon: Ronnette Juniper, MD;  [facility];  Service: Gastroenterology;;   TEE WITHOUT CARDIOVERSION N/A 01/29/2014   Procedure: TRANSESOPHAGEAL ECHOCARDIOGRAM (TEE);  Surgeon: Sueanne Margarita, MD;  Location: [facility];  Service: Cardiovascular;  Laterality: N/A;   TEE WITHOUT CARDIOVERSION N/A 04/02/2019   Procedure: TRANSESOPHAGEAL ECHOCARDIOGRAM (TEE);  Surgeon: Josue Hector, MD;  Location: [facility];  Service: Cardiovascular;  Laterality: N/A;   TEE WITHOUT CARDIOVERSION N/A 09/25/2019   Procedure: TRANSESOPHAGEAL ECHOCARDIOGRAM (TEE);  Surgeon: Jerline Pain, MD;  Location: [facility] END[facility];  Service: Cardiovascular;  Laterality: N/A;   TONSILLECTOMY     TRANSCATHETER AORTIC VALVE REPLACEMENT, TRANSFEMORAL  04/15/2019   TRANSCATHETER AORTIC VALVE REPLACEMENT, TRANSFEMORAL N/A 04/15/2019   Procedure: TRANSCATHETER AORTIC VALVE REPLACEMENT, TRANSFEMORAL with POST BALLOON DILATION;  Surgeon: Sherren Mocha, MD;  Location: [place];  Service: Open Heart Surgery;  Laterality: N/A;     Home Medications:  Prior to Admission medications   Medication Sig Start Date End Date Taking? Authorizing Provider  amiodarone (PACERONE) 200 MG tablet 200 mg daily EXCEPT on Sunday Patient taking differently: Take 200 mg by mouth daily. 10/03/21  Yes Evans Lance, MD  apixaban (ELIQUIS) 2.5 MG TABS tablet Take 1 tablet (2.5 mg total) by mouth 2 (two) times daily. 03/04/22  Yes Mercy Riding, MD  diphenoxylate-atropine (LOMOTIL) 2.5-0.025 MG tablet Take 1 tablet by mouth 3 (three) times daily as needed for diarrhea or loose stools. 02/20/22  Yes [provider]  epoetin alfa-epbx (RETACRIT) 2000 UNIT/ML injection Inject 2,000 Units into the vein See admin instructions. Every 6 weeks.   Yes [provider]  finasteride (PROSCAR) 5 MG tablet Take 5 mg by mouth daily as needed (retention).  06/26/19  Yes [provider]  folic acid (FOLVITE) 1 MG tablet Take 1 mg by mouth daily.   Yes [provider]  ketotifen (ZADITOR) 0.025 % ophthalmic solution Place 1 drop into both eyes 2 (two) times daily as needed (itchy eyes).   Yes [provider]  leflunomide (ARAVA) 20 MG tablet Take 20 mg by mouth daily.   Yes [provider]  levothyroxine (SYNTHROID) 88 MCG tablet Take 88 mcg by mouth every morning. 02/06/22  Yes [provider]  Multiple Vitamin (MULTIVITAMIN WITH MINERALS) TABS tablet Take 1 tablet by  mouth daily.   Yes [provider]  pantoprazole (PROTONIX) 40 MG tablet Take 40 mg by mouth daily.   Yes [provider]  potassium chloride SA (KLOR-CON) 20 MEQ tablet Take 1 tablet (20 mEq total) by mouth daily. NEED OV. Patient taking differently: Take 20 mEq by mouth daily. 03/24/21  Yes Eileen Stanford, PA-C  predniSONE (DELTASONE) 5 MG tablet Take 5 mg by mouth daily with breakfast.    Yes [provider]  tamsulosin (FLOMAX) 0.4 MG CAPS capsule Take 0.4 mg by mouth daily.   Yes [provider]  torsemide (DEMADEX) 20 MG tablet Take 40 mg by mouth daily. 12/31/20  Yes [provider]  traMADol (ULTRAM) 50 MG tablet Take 50 mg by mouth every 6 (six) hours as needed for moderate pain.   Yes [provider]  pantoprazole (PROTONIX) 40 MG tablet Take 1 tablet (40 mg total) by mouth 2 (two) times daily before a meal for 30 days, THEN 1 tablet (40 mg total) daily. Patient not  taking: Reported on 02/28/2022 02/25/22 05/26/22  Mercy Riding, MD    Inpatient Medications: Scheduled Meds:  sodium chloride   Intravenous Once   folic acid  1 mg Oral Daily   levothyroxine  88 mcg Oral q morning   pantoprazole (PROTONIX) IV  40 mg Intravenous Q12H   polyethylene glycol-electrolytes  4,000 mL Oral Once   predniSONE  5 mg Oral Q breakfast   tamsulosin  0.4 mg Oral Daily   Continuous Infusions:  amiodarone     amiodarone     lactated ringers 75 mL/hr at 03/02/22 1417   magnesium sulfate bolus IVPB     PRN Meds: acetaminophen **OR** acetaminophen, ketotifen  Allergies:   No Known Allergies  Social History:   Social History   Socioeconomic History   Marital status: Widowed    Spouse name: Not on file   Number of children: 2   Years of education: Not on file   Highest education level: Not on file  Occupational History   Occupation: Retired Market researcher at [employer] Use   Smoking status: Former    Packs/day: 0.50    Years: 10.00    Total pack years: 5.00    Types: Cigarettes    Start date: 01/16/1974    Quit date: 09/05/1983    Years since quitting: 38.5   Smokeless tobacco: Former    Types: Nurse, children's Use: Never used  Substance and Sexual Activity   Alcohol use: Not Currently    Alcohol/week: 0.0 standard drinks of alcohol   Drug use: No   Sexual activity: Not on file  Other Topics Concern   Not on file  Social History Narrative   Not on file   Social Determinants of Health   Financial Resource Strain: Not on file  Food Insecurity: Not on file  Transportation Needs: Not on file  Physical Activity: Not on file  Stress: Not on file  Social Connections: Not on file  Intimate Partner Violence: Not on file    Family History:   Family History  Problem Relation Age of Onset   Other Mother        NO HEALTH PROBLEMS   Heart disease Father    Arthritis Father    Heart Problems Sister        RELATED TO A MVA   Suicidality Brother     Other Sister  3 SISTERS IN GOOD HEALTH   Other Brother        2 BROTHERS IN GOOD HEALTH   Other Daughter        2 IN GOOD HEALTH     ROS:  Please see the history of present illness.  All other ROS reviewed and negative.     Physical Exam/Data:   Vitals:   03/01/22 1702 03/01/22 2013 03/02/22 0540 03/02/22 1324  BP: 103/83 105/80 116/79 95/72  Pulse: 69 (!) 43 73 (!) 112  Resp: '16 16 16   '$ Temp: 98.2 F (36.8 C) 98.8 F (37.1 C) 98.8 F (37.1 C) 97.8 F (36.6 C)  TempSrc: Oral Oral Oral Oral  SpO2: 99% 97% 98% 100%  Weight:      Height:        Intake/Output Summary (Last 24 hours) at 03/02/2022 1532 Last data filed at 03/01/2022 1702 Gross per 24 hour  Intake 455 ml  Output --  Net 455 ml      03/01/2022    2:32 PM 02/25/2022    4:29 AM 02/24/2022    5:00 AM  Last 3 Weights  Weight (lbs) 107 lb 9.4 oz 107 lb 9.4 oz 108 lb 4.8 oz  Weight (kg) 48.8 kg 48.8 kg 49.125 kg     VS:  BP (!) 153/94 (BP Location: Left Arm)   Pulse 68   Temp 97.7 F (36.5 C) (Oral)   Resp 16   Ht '5\' 3"'$  (1.6 m)   Wt 48.8 kg   SpO2 99%   BMI 19.06 kg/m  , BMI Body mass index is 19.06 kg/m. GENERAL:  Well appearing HEENT: Pupils equal round and reactive, fundi not visualized, oral mucosa unremarkable NECK:  JVP 2cm above clavicle sitting upright. Waveform within normal limits, carotid upstroke brisk and symmetric, no bruits, no thyromegaly LUNGS:  Clear to auscultation bilaterally HEART:  Tachycardic.  Irregularly irregular.Marland Kitchen  PMI not displaced or sustained,S1 and S2 within normal limits, no S3, no S4, no clicks, no rubs, no murmurs ABD:  Flat, positive bowel sounds normal in frequency in pitch, no bruits, no rebound, no guarding, no midline pulsatile mass, no hepatomegaly, no splenomegaly EXT:  2 plus pulses throughout, 2+ pitting edema to lower tibia bilaterally, no cyanosis no clubbing SKIN:  No rashes no nodules NEURO:  Cranial nerves II through XII grossly intact, motor  grossly intact throughout PSYCH:  Cognitively intact, oriented to person place and time'  EKG:  The EKG was personally reviewed and demonstrates: Atrial flutter versus SVT.Ventricular rate 132 beats minute.  QTc 550 ms.  LAFB. Telemetry:  Telemetry was personally reviewed and demonstrates: Atrial fibrillation/flutter with RVR  Relevant CV Studies:  Echo 02/24/22: IMPRESSIONS   1. Left ventricular ejection fraction, by estimation, is 55 to 60%. The  left ventricle has normal function. The left ventricle has no regional  wall motion abnormalities. Left ventricular diastolic function could not  be evaluated.   2. Right ventricular systolic function is moderately reduced. The right  ventricular size is moderately enlarged.   3. Left atrial size was severely dilated.   4. Right atrial size was mildly dilated.   5. The mitral valve is normal in structure. Mild mitral valve  regurgitation.   6. Tricuspid valve regurgitation is moderate.   7. The aortic valve has been repaired/replaced. Aortic valve  regurgitation is not visualized. No aortic stenosis is present.   Laboratory Data:  High Sensitivity Troponin:   Recent Labs  Lab 02/22/22 1244  02/22/22 1444 02/28/22 2109 02/28/22 2239  TROPONINIHS 77* 69* 49* 48*     Chemistry Recent Labs  Lab 02/25/22 0454 02/28/22 1650 02/28/22 1724 02/28/22 2109 03/01/22 0630 03/02/22 0519  NA 144 139 137  --  142 141  K 4.7 3.8 3.7  --  3.9 4.4  CL 110 106 104  --  109 110  CO2 27 24  --   --  24 22  GLUCOSE 94 100* 101*  --  88 81  BUN 36* 33* 32*  --  34* 27*  CREATININE 1.99* 2.34* 2.80*  --  2.13* 1.69*  CALCIUM 9.0 8.2*  --   --  8.2* 8.3*  MG 2.2  --   --  1.9 2.1  --   GFRNONAA 32* 27*  --   --  30* 39*  ANIONGAP 7 9  --   --  9 9    Recent Labs  Lab 02/25/22 0454 02/28/22 1650 03/01/22 0630  PROT  --  5.9* 5.5*  ALBUMIN 3.6 3.6 3.4*  AST  --  31 25  ALT  --  19 17  ALKPHOS  --  47 42  BILITOT  --  0.7 1.2   Lipids  No results for input(s): "CHOL", "TRIG", "HDL", "LABVLDL", "LDLCALC", "CHOLHDL" in the last 168 hours.  Hematology Recent Labs  Lab 03/01/22 2157 03/02/22 0519 03/02/22 1204  WBC 6.9 6.8 7.6  RBC 3.65* 3.78* 3.98*  HGB 9.0* 9.2* 9.7*  HCT 28.4* 29.5* 32.1*  MCV 77.8* 78.0* 80.7  MCH 24.7* 24.3* 24.4*  MCHC 31.7 31.2 30.2  RDW 21.6* 21.5* 21.8*  PLT 90* 92* 85*   Thyroid  Recent Labs  Lab 02/24/22 0456  TSH 0.691    BNP Recent Labs  Lab 02/25/22 0454 02/28/22 1650  BNP 351.9* 256.0*    DDimer No results for input(s): "DDIMER" in the last 168 hours.   Radiology/Studies:  DG Abd 2 Views  Result Date: 03/02/2022 CLINICAL DATA:  Abdominal pain, history of rectal bleeding EXAM: ABDOMEN - 2 VIEW COMPARISON:  None Available. FINDINGS: Bowel gas pattern is nonspecific. No abnormal masses or calcifications are seen. There is prosthetic cardiac valve. There is right shoulder arthroplasty. Surgical fusion is seen in the lumbar spine. IMPRESSION: Nonspecific bowel gas pattern. Electronically Signed   By: Elmer Picker M.D.   On: 03/02/2022 10:31   DG Chest Portable 1 View  Result Date: 02/28/2022 CLINICAL DATA:  Shortness of breath EXAM: PORTABLE CHEST 1 VIEW COMPARISON:  09/22/2019 FINDINGS: Right shoulder replacement. Post sternotomy changes and valve prosthesis. Mild cardiomegaly with aortic atherosclerosis. No edema or pleural effusion. IMPRESSION: No active disease. Mild cardiomegaly without edema or acute airspace disease Electronically Signed   By: Donavan Foil M.D.   On: 02/28/2022 18:46   CT ABDOMEN PELVIS WO CONTRAST  Result Date: 02/28/2022 CLINICAL DATA:  Recently discharged after having bloody diarrhea. Recurrent today. EXAM: CT ABDOMEN AND PELVIS WITHOUT CONTRAST TECHNIQUE: Multidetector CT imaging of the abdomen and pelvis was performed following the standard protocol without IV contrast. RADIATION DOSE REDUCTION: This exam was performed according to the  departmental dose-optimization program which includes automated exposure control, adjustment of the mA and/or kV according to patient size and/or use of iterative reconstruction technique. COMPARISON:  06/13/2019 abdominopelvic CT from [facility]. FINDINGS: Lower chest: Bibasilar scarring. Emphysema. Status post TAVR. Cardiomegaly. Hepatobiliary: Artifact degradation secondary to EKG wires and leads throughout. Subcentimeter right hepatic lobe cyst. Normal gallbladder, without biliary ductal dilatation. Pancreas:  Pancreatic atrophy. No duct dilatation or acute inflammation. Spleen: Normal in size, without focal abnormality. Adrenals/Urinary Tract: Normal adrenal glands. No renal calculi or hydronephrosis. Mild renal cortical thinning bilaterally. Fluid density exophytic right renal lesions of maximally 2.8 cm are likely cysts . In the absence of clinically indicated signs/symptoms require(s) no independent follow-up. The ureters are difficult to follow secondary to beam hardening artifact from lumbar spine hardware. The bladder is decompressed, without stone. Stomach/Bowel: Normal stomach, without wall thickening. Transverse duodenal diverticulum. Otherwise normal small bowel. Bowel is suboptimally evaluated secondary to lack of IV and paucity of enteric contrast. Suspect mild rectal wall thickening on 66/2. Normal terminal ileum. Appendix not visualized. Vascular/Lymphatic: Aortic atherosclerosis. No abdominopelvic adenopathy. Reproductive: Normal prostate. Other: No significant free fluid.  No free intraperitoneal air. Musculoskeletal: Defect in the left iliac is likely bone harvest site given lumbar spine fixation. Trans pedicle screw fixation at L3-5 with interbody fusion material at L4-5. Degenerate disc disease at the remainder of the lumbosacral spine with convex left lumbar spine curvature. IMPRESSION: 1. Multifactorial degradation, including lack of IV contrast, little if any enteric contrast, and  support apparatus artifact. 2. Suspect proctitis, with distribution favoring infection. Not a typical distribution for ischemic bowel despite the clinical history of bloody diarrhea. 3.  Aortic Atherosclerosis (ICD10-I70.0). Electronically Signed   By: Abigail Miyamoto M.D.   On: 02/28/2022 17:59     Assessment and Plan:   #Atrial flutter with RVR: Patient is on amiodarone chronically.  Blood pressures have been low.  He has been get IV fluids and the most recent blood pressure was up in the 150s.  Recommend holding IV fluids for now.  He has evidence of volume overload on exam.  Recommend switching amiodarone from oral to IV.  Would not bolus given his low blood pressure right now.  If he continues to need rate support, recommend giving digoxin 0.125 mg IV Q 8 hours x 3 doses followed by 0.125 mg daily until he no longer needs it for rate control.  Hopefully his heart rates will improve with resolution of his GI bleed.  Agree with continue to hold anticoagulation.  #Status post TAVR: Valve stable on echo 02/2022.   Risk Assessment/Risk Scores:        New York Heart Association (NYHA) Functional Class NYHA Class II  CHA2DS2-VASc Score =     This indicates a  % annual risk of stroke. The patient's score is based upon:           For questions or updates, please contact [place] Please consult [email or web address] for contact info under    Signed, Skeet Latch, MD  03/02/2022 3:32 PM

## 2022-03-02 NOTE — Progress Notes (Addendum)
PROGRESS NOTE    CLEMENTS TORO  RCV:[id number] DOB: 07/21/1935 DOA: 02/28/2022 PCP: Lavone Orn, MD   Brief Narrative: 87 year old with past medical history significant for hypertension, chronic combined CHF, paroxysmal A-fib on Eliquis, CKD 3B, rheumatoid arthritis, bioprosthetic AV, AICD, GERD, hypothyroidism who presents complaining of rectal bleeding, melena. Recent admission from 6/21 to 6/24 due to GI bleed, endoscopy done in 6/23 showed abnormal esophageal motility, erosive gastropathy with no stigmata of recent bleeding, GI recommended PPI for 1 month.  His Eliquis was on hold for 1 week.  He has not restarted Eliquis yet.  He received IV iron prior to discharge. T abdomen and pelvis without contrast: Showed suspect proctitis.   GI has been consulted and underwent  capsule endoscopy 6/28. Capsule appears to be in the stomach. X ray was ordered.   Plan for enteroscopy and colonoscopy tomorrow.    Assessment & Plan:   Principal Problem:   GI bleed Active Problems:   GERD (gastroesophageal reflux disease)   Atrial flutter (HCC)   Elevated troponin   Microcytic anemia   Hypothyroidism  1-Recurrent GI bleed: Melena -Continue with IV Protonix. -He received one unit PRBC 6/28. Hb increased to 9.  -Continue to cycle hemoglobin. -underwent Capsule endoscopy 6/28. Capsule appears to be in the stomach per GI notes.  -plan for enteroscopy, colonoscopy tomorrow.  -he had another black watery BM today.  Plan to monitor hb.   2-Microcytic anemia: Iron deficiency anemia, acute blood loss in the setting of GI bleed: Received one unit PRBC>   3-Chronic A fib: A fib RVR On oral amiodarone at home  Holding Eliquis.  Tachycardia this afternoon. He had diarrhea. Started  low rate IV fluids for 5 hours.  EKG initially SVT,HR down transiently with valsalva. Then HR fluctuates, 100--130. He is asymptomatic. Discussed with cardiology plan to start amiodarone gtt. Transfer to  progressive unit.    Elevated troponin, the setting of demand ischemia: Denies chest pain. Keep hemoglobin above 8.  Prolonged QT: Avoid prolonged QT medication. Replete electrolytes as needed. Mg 2.1  Chronic diastolic heart failure, history of aortic valve replacement: Hold torsemide due to GI bleed.  He received IV Lasix with blood transfusion 6/29    GERD; PPI  Hypothyroidism; continue with synthroid.   RA; on chronic low dose prednisone resume. Hold Avara due to thrombocytopenia,  BPH; resume flomax. Hold finasteride to avoid further hypotension.   AKI on CKD stage IIIb;  Prior Cr 1.7---1.8 Hold diuretics.  Cr down to 1.6.  Creatinine peak to 2.8  Estimated body mass index is 19.06 kg/m as calculated from the following:   Height as of this encounter: '5\' 3"'$  (1.6 m).   Weight as of this encounter: 48.8 kg.   DVT prophylaxis: SCD Code Status: DNR Family Communication: family at bedside.  Disposition Plan:  Status is: Inpatient Remains inpatient appropriate because: management of GI bleed.     Consultants:  GI  Procedures:  Capsule endoscopy   Antimicrobials:    Subjective: He is feeling better, after he received blood transfusion. He had another watery BM black this am. Denies worsening abdominal pain.    Objective: Vitals:   03/01/22 1702 03/01/22 2013 03/02/22 0540 03/02/22 1324  BP: 103/83 105/80 116/79 95/72  Pulse: 69 (!) 43 73 (!) 112  Resp: '16 16 16   '$ Temp: 98.2 F (36.8 C) 98.8 F (37.1 C) 98.8 F (37.1 C) 97.8 F (36.6 C)  TempSrc: Oral Oral Oral Oral  SpO2: 99% 97%  98% 100%  Weight:      Height:        Intake/Output Summary (Last 24 hours) at 03/02/2022 1357 Last data filed at 03/01/2022 1702 Gross per 24 hour  Intake 455 ml  Output --  Net 455 ml    Filed Weights   03/01/22 1432  Weight: 48.8 kg    Examination:  General exam: NAD Respiratory system: CTA Cardiovascular system: S 1, S 2 RRR Gastrointestinal system:   BS present, soft, nt Central nervous system: alert Extremities: plus 1 edema   Data Reviewed: I have personally reviewed following labs and imaging studies  CBC: Recent Labs  Lab 03/01/22 0630 03/01/22 1319 03/01/22 2157 03/02/22 0519 03/02/22 1204  WBC 7.0 7.3 6.9 6.8 7.6  HGB 7.7* 7.7* 9.0* 9.2* 9.7*  HCT 24.1* 24.4* 28.4* 29.5* 32.1*  MCV 75.8* 77.0* 77.8* 78.0* 80.7  PLT 142* 94* 90* 92* 85*    Basic Metabolic Panel: Recent Labs  Lab 02/24/22 0456 02/25/22 0454 02/28/22 1650 02/28/22 1724 02/28/22 2109 03/01/22 0630 03/02/22 0519  NA 145 144 139 137  --  142 141  K 3.3* 4.7 3.8 3.7  --  3.9 4.4  CL 110 110 106 104  --  109 110  CO2 '26 27 24  '$ --   --  24 22  GLUCOSE 74 94 100* 101*  --  88 81  BUN 31* 36* 33* 32*  --  34* 27*  CREATININE 2.09* 1.99* 2.34* 2.80*  --  2.13* 1.69*  CALCIUM 8.4* 9.0 8.2*  --   --  8.2* 8.3*  MG 2.2 2.2  --   --  1.9 2.1  --   PHOS 3.4 2.5  --   --   --  3.5  --     GFR: Estimated Creatinine Clearance: 22.1 mL/min (A) (by C-G formula based on SCr of 1.69 mg/dL (H)). Liver Function Tests: Recent Labs  Lab 02/24/22 0456 02/25/22 0454 02/28/22 1650 03/01/22 0630  AST  --   --  31 25  ALT  --   --  19 17  ALKPHOS  --   --  47 42  BILITOT  --   --  0.7 1.2  PROT  --   --  5.9* 5.5*  ALBUMIN 3.6 3.6 3.6 3.4*    No results for input(s): "LIPASE", "AMYLASE" in the last 168 hours. No results for input(s): "AMMONIA" in the last 168 hours. Coagulation Profile: Recent Labs  Lab 02/28/22 2239  INR 1.3*    Cardiac Enzymes: No results for input(s): "CKTOTAL", "CKMB", "CKMBINDEX", "TROPONINI" in the last 168 hours. BNP (last 3 results) No results for input(s): "PROBNP" in the last 8760 hours. HbA1C: No results for input(s): "HGBA1C" in the last 72 hours. CBG: No results for input(s): "GLUCAP" in the last 168 hours. Lipid Profile: No results for input(s): "CHOL", "HDL", "LDLCALC", "TRIG", "CHOLHDL", "LDLDIRECT" in the last  72 hours. Thyroid Function Tests: No results for input(s): "TSH", "T4TOTAL", "FREET4", "T3FREE", "THYROIDAB" in the last 72 hours. Anemia Panel: No results for input(s): "VITAMINB12", "FOLATE", "FERRITIN", "TIBC", "IRON", "RETICCTPCT" in the last 72 hours. Sepsis Labs: Recent Labs  Lab 02/28/22 2109 02/28/22 2239  LATICACIDVEN 1.3 0.9     No results found for this or any previous visit (from the past 240 hour(s)).       Radiology Studies: DG Abd 2 Views  Result Date: 03/02/2022 CLINICAL DATA:  Abdominal pain, history of rectal bleeding EXAM: ABDOMEN - 2 VIEW COMPARISON:  None Available. FINDINGS: Bowel gas pattern is nonspecific. No abnormal masses or calcifications are seen. There is prosthetic cardiac valve. There is right shoulder arthroplasty. Surgical fusion is seen in the lumbar spine. IMPRESSION: Nonspecific bowel gas pattern. Electronically Signed   By: Elmer Picker M.D.   On: 03/02/2022 10:31   DG Chest Portable 1 View  Result Date: 02/28/2022 CLINICAL DATA:  Shortness of breath EXAM: PORTABLE CHEST 1 VIEW COMPARISON:  09/22/2019 FINDINGS: Right shoulder replacement. Post sternotomy changes and valve prosthesis. Mild cardiomegaly with aortic atherosclerosis. No edema or pleural effusion. IMPRESSION: No active disease. Mild cardiomegaly without edema or acute airspace disease Electronically Signed   By: Donavan Foil M.D.   On: 02/28/2022 18:46   CT ABDOMEN PELVIS WO CONTRAST  Result Date: 02/28/2022 CLINICAL DATA:  Recently discharged after having bloody diarrhea. Recurrent today. EXAM: CT ABDOMEN AND PELVIS WITHOUT CONTRAST TECHNIQUE: Multidetector CT imaging of the abdomen and pelvis was performed following the standard protocol without IV contrast. RADIATION DOSE REDUCTION: This exam was performed according to the departmental dose-optimization program which includes automated exposure control, adjustment of the mA and/or kV according to patient size and/or use of  iterative reconstruction technique. COMPARISON:  06/13/2019 abdominopelvic CT from [facility]. FINDINGS: Lower chest: Bibasilar scarring. Emphysema. Status post TAVR. Cardiomegaly. Hepatobiliary: Artifact degradation secondary to EKG wires and leads throughout. Subcentimeter right hepatic lobe cyst. Normal gallbladder, without biliary ductal dilatation. Pancreas: Pancreatic atrophy. No duct dilatation or acute inflammation. Spleen: Normal in size, without focal abnormality. Adrenals/Urinary Tract: Normal adrenal glands. No renal calculi or hydronephrosis. Mild renal cortical thinning bilaterally. Fluid density exophytic right renal lesions of maximally 2.8 cm are likely cysts . In the absence of clinically indicated signs/symptoms require(s) no independent follow-up. The ureters are difficult to follow secondary to beam hardening artifact from lumbar spine hardware. The bladder is decompressed, without stone. Stomach/Bowel: Normal stomach, without wall thickening. Transverse duodenal diverticulum. Otherwise normal small bowel. Bowel is suboptimally evaluated secondary to lack of IV and paucity of enteric contrast. Suspect mild rectal wall thickening on 66/2. Normal terminal ileum. Appendix not visualized. Vascular/Lymphatic: Aortic atherosclerosis. No abdominopelvic adenopathy. Reproductive: Normal prostate. Other: No significant free fluid.  No free intraperitoneal air. Musculoskeletal: Defect in the left iliac is likely bone harvest site given lumbar spine fixation. Trans pedicle screw fixation at L3-5 with interbody fusion material at L4-5. Degenerate disc disease at the remainder of the lumbosacral spine with convex left lumbar spine curvature. IMPRESSION: 1. Multifactorial degradation, including lack of IV contrast, little if any enteric contrast, and support apparatus artifact. 2. Suspect proctitis, with distribution favoring infection. Not a typical distribution for ischemic bowel despite the clinical  history of bloody diarrhea. 3.  Aortic Atherosclerosis (ICD10-I70.0). Electronically Signed   By: Abigail Miyamoto M.D.   On: 02/28/2022 17:59        Scheduled Meds:  sodium chloride   Intravenous Once   amiodarone  200 mg Oral Once per day on Mon Tue Wed Thu Fri Sat   folic acid  1 mg Oral Daily   levothyroxine  88 mcg Oral q morning   pantoprazole (PROTONIX) IV  40 mg Intravenous Q12H   polyethylene glycol-electrolytes  4,000 mL Oral Once   predniSONE  5 mg Oral Q breakfast   tamsulosin  0.4 mg Oral Daily   Continuous Infusions:  lactated ringers       LOS: 2 days    Time spent: 45 minutes    Elmarie Shiley, MD [facility]   If 7PM-7AM, please contact night-coverage [email or web address]  03/02/2022, 1:57 PM

## 2022-03-02 NOTE — Progress Notes (Signed)
[facility] Gastroenterology Progress Note  Joshua Hudson 87 y.o. Aug 10, 1935  CC:  Melena   Subjective: Patient seen and examined sitting in bedside chair this morning.  States he is feeling well, was able to swallow capsule without any difficulty.  Denies nausea, vomiting, fever, chills, abdominal pain.  Has not had a bowel movement overnight.  Tolerating clear liquid diet.  ROS : Review of Systems  Constitutional:  Negative for chills and fever.  Gastrointestinal:  Negative for abdominal pain, blood in stool, constipation, diarrhea, heartburn, melena, nausea and vomiting.  Genitourinary:  Negative for dysuria and urgency.      Objective: Vital signs in last 24 hours: Vitals:   03/01/22 2013 03/02/22 0540  BP: 105/80 116/79  Pulse: (!) 43 73  Resp: 16 16  Temp: 98.8 F (37.1 C) 98.8 F (37.1 C)  SpO2: 97% 98%    Physical Exam:  General:  Alert, cooperative, no distress, appears stated age  Head:  Normocephalic, without obvious abnormality, atraumatic  Eyes:  Anicteric sclera, EOM's intact  Lungs:   Clear to auscultation bilaterally, respirations unlabored  Heart:  Regular rate and rhythm, S1, S2 normal  Abdomen:   Soft, non-tender, bowel sounds active all four quadrants,  no masses   Extremities: Mild LE edema bilaterally  Pulses: 2+ and symmetric    Lab Results: Recent Labs    02/28/22 2109 03/01/22 0630 03/02/22 0519  NA  --  142 141  K  --  3.9 4.4  CL  --  109 110  CO2  --  24 22  GLUCOSE  --  88 81  BUN  --  34* 27*  CREATININE  --  2.13* 1.69*  CALCIUM  --  8.2* 8.3*  MG 1.9 2.1  --   PHOS  --  3.5  --    Recent Labs    02/28/22 1650 03/01/22 0630  AST 31 25  ALT 19 17  ALKPHOS 47 42  BILITOT 0.7 1.2  PROT 5.9* 5.5*  ALBUMIN 3.6 3.4*   Recent Labs    03/01/22 2157 03/02/22 0519  WBC 6.9 6.8  HGB 9.0* 9.2*  HCT 28.4* 29.5*  MCV 77.8* 78.0*  PLT 90* 92*   Recent Labs    02/28/22 2239  LABPROT 15.6*  INR 1.3*     Assessment Recurrent GI bleed, melena, acute on chronic blood loss anemia - Hemoglobin 9.2, no leukocytosis, platelets 92 - BUN 27, creatinine 1.69, improving - On IV Protonix, clear liquid diet - EGD 02/24/2022 - Esophageal dysmotility, erosive gastropathy with no stigmata of recent bleeding, small hiatal hernia, small amount of phytobezoar in the stomach.  Plan: Patient had capsule endoscopy yesterday; however, capsule stayed in patient's stomach the entire time. Will order X-ray abd 2 view. Plan for Enteroscopy/Colonoscopy tomorrow. I thoroughly discussed the procedures to include nature, alternatives, benefits, and risks including but not limited to bleeding, perforation, infection, anesthesia/cardiac and pulmonary complications. Patient provides understanding and gave verbal consent to proceed. Continue Protonix 40 mg IV BID. Nulytely prep, clear liquid diet, NPO at midnight. Continue daily CBC with transfusion as needed to maintain Hgb >7.  [facility] will follow.    Angelique Holm PA-C 03/02/2022, 9:54 AM  Contact #  [phone]

## 2022-03-03 ENCOUNTER — Inpatient Hospital Stay (HOSPITAL_COMMUNITY): Payer: Medicare PPO | Admitting: Certified Registered Nurse Anesthetist

## 2022-03-03 ENCOUNTER — Encounter (HOSPITAL_COMMUNITY)
Admission: EM | Disposition: A | Discharge status: Home / Self Care | Payer: Self-pay | Source: Home / Self Care | Attending: Internal Medicine

## 2022-03-03 ENCOUNTER — Encounter (HOSPITAL_COMMUNITY): Payer: Self-pay | Admitting: Gastroenterology

## 2022-03-03 DIAGNOSIS — K31811 Angiodysplasia of stomach and duodenum with bleeding: Secondary | ICD-10-CM

## 2022-03-03 DIAGNOSIS — T184XXA Foreign body in colon, initial encounter: Secondary | ICD-10-CM

## 2022-03-03 DIAGNOSIS — K3189 Other diseases of stomach and duodenum: Secondary | ICD-10-CM

## 2022-03-03 DIAGNOSIS — K922 Gastrointestinal hemorrhage, unspecified: Secondary | ICD-10-CM | POA: Diagnosis not present

## 2022-03-03 DIAGNOSIS — I4892 Unspecified atrial flutter: Secondary | ICD-10-CM | POA: Diagnosis not present

## 2022-03-03 DIAGNOSIS — R778 Other specified abnormalities of plasma proteins: Secondary | ICD-10-CM | POA: Diagnosis not present

## 2022-03-03 DIAGNOSIS — K5521 Angiodysplasia of colon with hemorrhage: Secondary | ICD-10-CM

## 2022-03-03 HISTORY — PX: HEMOSTASIS CLIP PLACEMENT: SHX6857

## 2022-03-03 HISTORY — PX: COLONOSCOPY: SHX5424

## 2022-03-03 HISTORY — PX: POLYPECTOMY: SHX5525

## 2022-03-03 HISTORY — PX: ENTEROSCOPY: SHX5533

## 2022-03-03 HISTORY — PX: HOT HEMOSTASIS: SHX5433

## 2022-03-03 SURGERY — ENTEROSCOPY
Anesthesia: Monitor Anesthesia Care

## 2022-03-03 MED ORDER — SODIUM CHLORIDE 0.9 % IV SOLN: INTRAVENOUS | Status: DC

## 2022-03-03 MED ORDER — LACTATED RINGERS IV SOLN: INTRAVENOUS | Status: DC | PRN

## 2022-03-03 MED ORDER — LIDOCAINE 2% (20 MG/ML) 5 ML SYRINGE
INTRAMUSCULAR | Status: DC | PRN
  Administered 2022-03-03: 50 mg via INTRAVENOUS

## 2022-03-03 MED ORDER — PROPOFOL 500 MG/50ML IV EMUL
INTRAVENOUS | Status: DC | PRN
  Administered 2022-03-03: 75 ug/kg/min via INTRAVENOUS

## 2022-03-03 NOTE — Op Note (Signed)
[facility] Patient Name: Joshua Hudson Procedure Date: 03/03/2022 MRN: [id number] Attending MD: Otis Brace , MD Date of Birth: 12-03-1934 CSN: [id number] Age: 87 Admit Type: Inpatient Procedure:                Colonoscopy Indications:              Gastrointestinal bleeding Providers:                Otis Brace, MD, Jaci Carrel, RN,                            William Dalton, Technician, Darliss Cheney, Technician Referring MD:              Medicines:                Sedation Administered by an Anesthesia Professional Complications:            No immediate complications. Estimated Blood Loss:     Estimated blood loss was minimal. Procedure:                Pre-Anesthesia Assessment:                           - Prior to the procedure, a History and Physical                            was performed, and patient medications and                            allergies were reviewed. The patient's tolerance of                            previous anesthesia was also reviewed. The risks                            and benefits of the procedure and the sedation                            options and risks were discussed with the patient.                            All questions were answered, and informed consent                            was obtained. Prior Anticoagulants: The patient has                            taken Eliquis (apixaban). ASA Grade Assessment: IV                            - A patient with severe systemic disease that is a                            constant threat to life. After reviewing the risks  and benefits, the patient was deemed in                            satisfactory condition to undergo the procedure.                           After obtaining informed consent, the colonoscope                            was passed under direct vision. Throughout the                            procedure, the patient's blood  pressure, pulse, and                            oxygen saturations were monitored continuously. The                            PCF-HQ190L ([id number] Olympus colonoscope was                            introduced through the anus and advanced to the the                            terminal ileum, with identification of the                            appendiceal orifice and IC valve. The colonoscopy                            was performed without difficulty. The patient                            tolerated the procedure well. The quality of the                            bowel preparation was fair. Scope In: 1:28:47 PM Scope Out: 1:41:12 PM Scope Withdrawal Time: 0 hours 8 minutes 50 seconds  Total Procedure Duration: 0 hours 12 minutes 25 seconds  Findings:      Skin tags were found on perianal exam.      The terminal ileum appeared normal.      A foreign body was found in the cecum.      A few patchy angioectasias without bleeding were found in the ascending       colon and in the cecum. Fulguration to ablate the lesion to prevent       bleeding by argon plasma was successful.      Internal hemorrhoids were found during retroflexion. The hemorrhoids       were medium-sized.      A 3 mm polyp was found in the ascending colon. The polyp was sessile.       The polyp was removed with a cold biopsy forceps. Resection and       retrieval were complete.      A single (solitary) ulcer was found in the distal rectum. No  bleeding       was present. No stigmata of recent bleeding were seen. Impression:               - Preparation of the colon was fair.                           - Perianal skin tags found on perianal exam.                           - The examined portion of the ileum was normal.                           - Foreign body in the cecum.                           - A few non-bleeding colonic angioectasias. Treated                            with argon plasma coagulation (APC).                            - Internal hemorrhoids.                           - One 3 mm polyp in the ascending colon, removed                            with a cold biopsy forceps. Resected and retrieved.                           - A single (solitary) ulcer in the distal rectum. Moderate Sedation:      Moderate (conscious) sedation was personally administered by an       anesthesia professional. The following parameters were monitored: oxygen       saturation, heart rate, blood pressure, and response to care. Recommendation:           - Return patient to hospital ward for ongoing care.                           - Soft diet.                           - Continue present medications.                           - Await pathology results. Procedure Code(s):        --- Professional ---                           [phone], 64, Colonoscopy, flexible; with control of                            bleeding, any method                           45380, Colonoscopy, flexible; with biopsy, single  or multiple Diagnosis Code(s):        --- Professional ---                           K64.8, Other hemorrhoids                           T18.4XXA, Foreign body in colon, initial encounter                           K55.20, Angiodysplasia of colon without hemorrhage                           K63.5, Polyp of colon                           K62.6, Ulcer of anus and rectum                           K64.4, Residual hemorrhoidal skin tags                           K92.2, Gastrointestinal hemorrhage, unspecified CPT copyright 2019 American [employer] Association. All rights reserved. The codes documented in this report are preliminary and upon coder review may  be revised to meet current compliance requirements. Otis Brace, MD Otis Brace, MD 03/03/2022 2:04:27 PM Number of Addenda: 0

## 2022-03-03 NOTE — Op Note (Signed)
[facility] Patient Name: Joshua Hudson Procedure Date: 03/03/2022 MRN: [id number] Attending MD: Otis Brace , MD Date of Birth: 1935/06/02 CSN: [id number] Age: 87 Admit Type: Inpatient Procedure:                Small bowel enteroscopy Indications:              Melena Providers:                Otis Brace, MD, Jaci Carrel, RN,                            William Dalton, Technician Referring MD:              Medicines:                Sedation Administered by an Anesthesia Professional Complications:            No immediate complications. Estimated Blood Loss:     Estimated blood loss was minimal. Procedure:                Pre-Anesthesia Assessment:                           - Prior to the procedure, a History and Physical                            was performed, and patient medications and                            allergies were reviewed. The patient's tolerance of                            previous anesthesia was also reviewed. The risks                            and benefits of the procedure and the sedation                            options and risks were discussed with the patient.                            All questions were answered, and informed consent                            was obtained. Prior Anticoagulants: The patient has                            taken Eliquis (apixaban). ASA Grade Assessment: IV                            - A patient with severe systemic disease that is a                            constant threat to life. After reviewing the risks  and benefits, the patient was deemed in                            satisfactory condition to undergo the procedure.                           After obtaining informed consent, the endoscope was                            passed under direct vision. Throughout the                            procedure, the patient's blood pressure, pulse, and                             oxygen saturations were monitored continuously. The                            PCF-[id number] ([id number] Olympus slim colonoscope was                            introduced through the mouth and advanced to the                            jejunum, to the 100 cm mark (from the incisors).                            The small bowel enteroscopy was accomplished                            without difficulty. The patient tolerated the                            procedure well. Scope In: Scope Out: Findings:      The Z-line was regular and was found 37 cm from the incisors.      Patchy mildly erythematous mucosa without bleeding was found in the       gastric body.      The cardia and gastric fundus were normal on retroflexion.      A few angioectasias with bleeding were found in the fourth portion of       the duodenum. Fulguration to ablate the lesion to prevent bleeding by       argon plasma was successful. For hemostasis, one hemostatic clip was       successfully placed. There was no bleeding at the end of the procedure.      A few angioectasias with no bleeding were found in the proximal jejunum.       Fulguration to ablate the lesion to prevent bleeding by argon plasma was       successful. Impression:               - Z-line regular, 37 cm from the incisors.                           - Erythematous mucosa in the gastric body.                           -  A few bleeding angioectasias in the duodenum.                            Treated with argon plasma coagulation (APC). Clip                            was placed.                           - A few non-bleeding angioectasias in the jejunum.                            Treated with argon plasma coagulation (APC).                           - No specimens collected. Recommendation:           - Perform a colonoscopy today. Procedure Code(s):        --- Professional ---                           [phone], Small intestinal endoscopy, enteroscopy                             beyond second portion of duodenum, not including                            ileum; with control of bleeding (eg, injection,                            bipolar cautery, unipolar cautery, laser, heater                            probe, stapler, plasma coagulator) Diagnosis Code(s):        --- Professional ---                           K31.89, Other diseases of stomach and duodenum                           K31.811, Angiodysplasia of stomach and duodenum                            with bleeding                           K55.20, Angiodysplasia of colon without hemorrhage                           K92.1, Melena (includes Hematochezia) CPT copyright 2019 American [employer] Association. All rights reserved. The codes documented in this report are preliminary and upon coder review may  be revised to meet current compliance requirements. Otis Brace, MD Otis Brace, MD 03/03/2022 1:58:16 PM Number of Addenda: 0

## 2022-03-03 NOTE — Transfer of Care (Signed)
Immediate Anesthesia Transfer of Care Note  Patient: Joshua Hudson  Procedure(s) Performed: ENTEROSCOPY COLONOSCOPY HOT HEMOSTASIS (ARGON PLASMA COAGULATION/BICAP) HEMOSTASIS CLIP PLACEMENT POLYPECTOMY  Patient Location: Endoscopy Unit  Anesthesia Type:MAC  Level of Consciousness: awake, alert  and patient cooperative  Airway & Oxygen Therapy: Patient Spontanous Breathing and Patient connected to face mask oxygen  Post-op Assessment: Report given to RN and Post -op Vital signs reviewed and stable  Post vital signs: Reviewed and stable  Last Vitals:  Vitals Value Taken Time  BP    Temp    Pulse    Resp    SpO2      Last Pain:  Vitals:   03/03/22 1212  TempSrc: Temporal  PainSc: 0-No pain         Complications: No notable events documented.

## 2022-03-03 NOTE — Progress Notes (Addendum)
Progress Note  Patient Name: Joshua Hudson Date of Encounter: 03/03/2022  [facility] Cardiologist: [name], MD   Subjective   Denies any CP or SOB.   Inpatient Medications    Scheduled Meds:  sodium chloride   Intravenous Once   folic acid  1 mg Oral Daily   levothyroxine  88 mcg Oral q morning   pantoprazole (PROTONIX) IV  40 mg Intravenous Q12H   predniSONE  5 mg Oral Q breakfast   tamsulosin  0.4 mg Oral Daily   Continuous Infusions:  amiodarone 30 mg/hr (03/03/22 0134)   PRN Meds: acetaminophen **OR** acetaminophen, ketotifen   Vital Signs    Vitals:   03/02/22 2348 03/03/22 0056 03/03/22 0400 03/03/22 0941  BP:  111/79 111/70 (!) 116/98  Pulse:  (!) 104 87 76  Resp: 19 (!) '22 16 18  '$ Temp:  98 F (36.7 C) 97.8 F (36.6 C) 98.3 F (36.8 C)  TempSrc:  Oral Oral Oral  SpO2:  98% 99% 100%  Weight:      Height:        Intake/Output Summary (Last 24 hours) at 03/03/2022 1003 Last data filed at 03/02/2022 1809 Gross per 24 hour  Intake 265.22 ml  Output --  Net 265.22 ml      03/01/2022    2:32 PM 02/25/2022    4:29 AM 02/24/2022    5:00 AM  Last 3 Weights  Weight (lbs) 107 lb 9.4 oz 107 lb 9.4 oz 108 lb 4.8 oz  Weight (kg) 48.8 kg 48.8 kg 49.125 kg      Telemetry    Atrial fibrillation HR 80s, occasional spike in HR to 130s - Personally Reviewed  ECG    Atrial flutter with RVR - Personally Reviewed  Physical Exam   GEN: No acute distress.   Neck: No JVD Cardiac: irregular, no murmurs, rubs, or gallops.  Respiratory: Clear to auscultation bilaterally. GI: Soft, nontender, non-distended  MS: No edema; No deformity. Neuro:  Nonfocal  Psych: Normal affect   Labs    High Sensitivity Troponin:   Recent Labs  Lab 02/22/22 1244 02/22/22 1444 02/28/22 2109 02/28/22 2239  TROPONINIHS 77* 69* 49* 48*     Chemistry Recent Labs  Lab 02/25/22 0454 02/28/22 1650 02/28/22 1724 02/28/22 2109 03/01/22 0630 03/02/22 0519  03/02/22 1513  NA 144 139   < >  --  142 141 138  K 4.7 3.8   < >  --  3.9 4.4 4.1  CL 110 106   < >  --  109 110 107  CO2 27 24  --   --  '24 22 23  '$ GLUCOSE 94 100*   < >  --  88 81 126*  BUN 36* 33*   < >  --  34* 27* 32*  CREATININE 1.99* 2.34*   < >  --  2.13* 1.69* 1.82*  CALCIUM 9.0 8.2*  --   --  8.2* 8.3* 8.3*  MG 2.2  --   --  1.9 2.1  --  2.1  PROT  --  5.9*  --   --  5.5*  --   --   ALBUMIN 3.6 3.6  --   --  3.4*  --   --   AST  --  31  --   --  25  --   --   ALT  --  19  --   --  17  --   --  ALKPHOS  --  47  --   --  42  --   --   BILITOT  --  0.7  --   --  1.2  --   --   GFRNONAA 32* 27*  --   --  30* 39* 36*  ANIONGAP 7 9  --   --  '9 9 8   '$ < > = values in this interval not displayed.    Lipids No results for input(s): "CHOL", "TRIG", "HDL", "LABVLDL", "LDLCALC", "CHOLHDL" in the last 168 hours.  Hematology Recent Labs  Lab 03/02/22 0519 03/02/22 1204 03/02/22 2227  WBC 6.8 7.6 8.4  RBC 3.78* 3.98* 3.80*  HGB 9.2* 9.7* 9.2*  HCT 29.5* 32.1* 29.7*  MCV 78.0* 80.7 78.2*  MCH 24.3* 24.4* 24.2*  MCHC 31.2 30.2 31.0  RDW 21.5* 21.8* 21.4*  PLT 92* 85* 102*   Thyroid No results for input(s): "TSH", "FREET4" in the last 168 hours.  BNP Recent Labs  Lab 02/25/22 0454 02/28/22 1650  BNP 351.9* 256.0*    DDimer No results for input(s): "DDIMER" in the last 168 hours.   Radiology    DG Abd 2 Views  Result Date: 03/02/2022 CLINICAL DATA:  Abdominal pain, history of rectal bleeding EXAM: ABDOMEN - 2 VIEW COMPARISON:  None Available. FINDINGS: Bowel gas pattern is nonspecific. No abnormal masses or calcifications are seen. There is prosthetic cardiac valve. There is right shoulder arthroplasty. Surgical fusion is seen in the lumbar spine. IMPRESSION: Nonspecific bowel gas pattern. Electronically Signed   By: Elmer Picker M.D.   On: 03/02/2022 10:31    Cardiac Studies   Echo 02/24/2022  1. Left ventricular ejection fraction, by estimation, is 55 to 60%.  The  left ventricle has normal function. The left ventricle has no regional  wall motion abnormalities. Left ventricular diastolic function could not  be evaluated.   2. Right ventricular systolic function is moderately reduced. The right  ventricular size is moderately enlarged.   3. Left atrial size was severely dilated.   4. Right atrial size was mildly dilated.   5. The mitral valve is normal in structure. Mild mitral valve  regurgitation.   6. Tricuspid valve regurgitation is moderate.   7. The aortic valve has been repaired/replaced. Aortic valve  regurgitation is not visualized. No aortic stenosis is present.   Patient Profile     87 y.o. male with PMH of RA, aortic stenosis s/p TAVR 04/2021 (26 mm Edwards Sapien 3), atrial flutter s/p RFA 10/2019 who was admitted with recurrent GI bleed on 6/29. Cardiology consulted for afib with RVR  Assessment & Plan    Atrial flutter with RVR  - h/o RFA 10/2019. On PO amiodarone chronically. Eliquis held due to recurrent GI bleed  - PO amiodarone transioned to IV amiodarone, HR overnight 80s, occasional spike in HR to 130s  (not sure why, as patient denies any movement and did not try to get up) Fortunately, most of the time, his HR seems to be very well controlled.    AS s/p TAVR: stable on recent echo on 02/24/2022  Recurrent GI bleed: Capsule endoscopy on 6/28, pending enteroscopy and colonoscopy today.  Leg edema: 2+ pitting edema in bilateral lower extremity, mildly volume overloaded. Lung is clear. Likely related to recent IV fluid.       For questions or updates, please contact [place] Please consult [email or web address] for contact info under        Signed, Almyra Deforest, PA  03/03/2022, 10:03 AM

## 2022-03-03 NOTE — TOC Progression Note (Signed)
Transition of Care [facility]) - Progression Note    Patient Details  Name: Joshua Hudson MRN: [id number] Date of Birth: Apr 30, 1935  Transition of Care [facility]) CM/SW Contact  Leeroy Cha, RN Phone Number: 03/03/2022, 7:51 AM  Clinical Narrative:    [id number] chart reviewed.  Following for toc needs.   Expected Discharge Plan: Home/Self Care Barriers to Discharge: Continued Medical Work up  Expected Discharge Plan and Services Expected Discharge Plan: Home/Self Care   Discharge Planning Services: CM Consult   Living arrangements for the past 2 months: Single Family Home                                       Social Determinants of Health (SDOH) Interventions    Readmission Risk Interventions    03/01/2022    1:46 PM  Readmission Risk Prevention Plan  Transportation Screening Complete  PCP or Specialist Appt within 3-5 Days Complete  HRI or [place] Complete  Social Work Consult for [place] Planning/Counseling Complete  Palliative Care Screening Not Applicable  Medication Review [employer]) Complete

## 2022-03-03 NOTE — Progress Notes (Signed)
Pt c/o severe pain to medial side of right forearm, adjacent to peripheral iv site (saline lock). Removed IV, pt sustained approx 1 inch skin tear while removing tape to posterior forearm. 2x2 gauze and tegaderm dressing applied.

## 2022-03-03 NOTE — Progress Notes (Signed)
PROGRESS NOTE    Joshua Hudson  EHM:[id number] DOB: [birth year] DOA: 02/28/2022 PCP: Lavone Orn, MD   Brief Narrative: 87 year old with past medical history significant for hypertension, chronic combined CHF, paroxysmal A-fib on Eliquis, CKD 3B, rheumatoid arthritis, bioprosthetic AV, AICD, GERD, hypothyroidism who presents complaining of rectal bleeding, melena. Recent admission from 6/21 to 6/24 due to GI bleed, endoscopy done in 6/23 showed abnormal esophageal motility, erosive gastropathy with no stigmata of recent bleeding, GI recommended PPI for 1 month.  His Eliquis was on hold for 1 week.  He has not restarted Eliquis yet.  He received IV iron prior to discharge. T abdomen and pelvis without contrast: Showed suspect proctitis.   GI has been consulted and underwent  capsule endoscopy 6/28. Capsule appears to be in the stomach. X ray was ordered.   Underwent  enteroscopy and colonoscopy 6/30: Push enteroscopy showed few AVMs in the Duodenum with stigmata of recent bleeding.  Treated with APC and clip placement.  Also has scattered AVMs in the proximal jejunum treated with APC.  Colonoscopy showed normal TI, few a small AVM, nonbleeding in the cecum and ascending colon treated with APC.  Small ascending colon polyp removed.    Assessment & Plan:   Principal Problem:   GI bleed Active Problems:   GERD (gastroesophageal reflux disease)   Atrial flutter (HCC)   Elevated troponin   Microcytic anemia   Hypothyroidism  1-Recurrent GI bleed: Melena -Continue with IV Protonix. -He received one unit PRBC 6/28. Hb increased to 9. Has remain stable -underwent Capsule endoscopy 6/28. Capsule appears to be in the stomach per GI notes.  -Push enteroscopy showed few AVMs in the Duodenum with stigmata of recent bleeding.  Treated with APC and clip placement.  Also has scattered AVMs in the proximal jejunum treated with APC.  Colonoscopy showed normal TI, few a small AVM, nonbleeding in the  cecum and ascending colon treated with APC.  Small ascending colon polyp removed. -Per GI ok to resume low dose anticoagulation tomorrow (if hb remain stable)  for at least one week.  -start soft diet.  Monitor hb.   2-Microcytic anemia: Iron deficiency anemia, acute blood loss in the setting of GI bleed: Received one unit PRBC>  Hb stable   3-Chronic A fib: A fib RVR On oral amiodarone at home  Holding Eliquis. Might be able to resume tomorrow low dose if hb remain stable.  Started on IV amiodarone.  Appreciate cardiology assistance.    Elevated troponin, the setting of demand ischemia: Denies chest pain. Keep hemoglobin above 8.  Prolonged QT: Avoid prolonged QT medication. Replete electrolytes as needed. Mg 2.1  Chronic diastolic heart failure, history of aortic valve replacement: Hold torsemide due to GI bleed.  He received IV Lasix with blood transfusion 6/29   GERD; PPI  Hypothyroidism; continue with synthroid.   RA; on chronic low dose prednisone resume. Hold Avara due to thrombocytopenia,  BPH; resume flomax. Hold finasteride to avoid further hypotension.   AKI on CKD stage IIIb;  Prior Cr 1.7---1.8 Hold diuretics.  Cr down to 1.6.  Creatinine peak to 2.8  Estimated body mass index is 19.06 kg/m as calculated from the following:   Height as of this encounter: '5\' 3"'$  (1.6 m).   Weight as of this encounter: 48.8 kg.   DVT prophylaxis: SCD Code Status: DNR Family Communication: family at bedside.  Disposition Plan:  Status is: Inpatient Remains inpatient appropriate because: management of GI bleed.  Consultants:  GI  Procedures:  Capsule endoscopy   Antimicrobials:    Subjective: No further black stool since yesterday. He denies chest pain.   Objective: Vitals:   03/03/22 0400 03/03/22 0941 03/03/22 1212 03/03/22 1348  BP: 111/70 (!) 116/98 136/76 (!) 127/58  Pulse: 87 76 81 100  Resp: '16 18 18 18  '$ Temp: 97.8 F (36.6 C) 98.3 F  (36.8 C) 97.7 F (36.5 C) (!) 97.4 F (36.3 C)  TempSrc: Oral Oral Temporal Temporal  SpO2: 99% 100% 100% 100%  Weight:      Height:        Intake/Output Summary (Last 24 hours) at 03/03/2022 1449 Last data filed at 03/03/2022 1340 Gross per 24 hour  Intake 1065.22 ml  Output --  Net 1065.22 ml    Filed Weights   03/01/22 1432  Weight: 48.8 kg    Examination:  General exam: NAD Respiratory system:CTA Cardiovascular system: S 1, S 2 IRR, Positive murmur.  Gastrointestinal system: BS present, soft, nt Central nervous system: Alert Extremities: Plus 1 edema.    Data Reviewed: I have personally reviewed following labs and imaging studies  CBC: Recent Labs  Lab 03/01/22 1319 03/01/22 2157 03/02/22 0519 03/02/22 1204 03/02/22 2227  WBC 7.3 6.9 6.8 7.6 8.4  HGB 7.7* 9.0* 9.2* 9.7* 9.2*  HCT 24.4* 28.4* 29.5* 32.1* 29.7*  MCV 77.0* 77.8* 78.0* 80.7 78.2*  PLT 94* 90* 92* 85* 102*    Basic Metabolic Panel: Recent Labs  Lab 02/25/22 0454 02/28/22 1650 02/28/22 1724 02/28/22 2109 03/01/22 0630 03/02/22 0519 03/02/22 1513  NA 144 139 137  --  142 141 138  K 4.7 3.8 3.7  --  3.9 4.4 4.1  CL 110 106 104  --  109 110 107  CO2 27 24  --   --  '24 22 23  '$ GLUCOSE 94 100* 101*  --  88 81 126*  BUN 36* 33* 32*  --  34* 27* 32*  CREATININE 1.99* 2.34* 2.80*  --  2.13* 1.69* 1.82*  CALCIUM 9.0 8.2*  --   --  8.2* 8.3* 8.3*  MG 2.2  --   --  1.9 2.1  --  2.1  PHOS 2.5  --   --   --  3.5  --   --     GFR: Estimated Creatinine Clearance: 20.5 mL/min (A) (by C-G formula based on SCr of 1.82 mg/dL (H)). Liver Function Tests: Recent Labs  Lab 02/25/22 0454 02/28/22 1650 03/01/22 0630  AST  --  31 25  ALT  --  19 17  ALKPHOS  --  47 42  BILITOT  --  0.7 1.2  PROT  --  5.9* 5.5*  ALBUMIN 3.6 3.6 3.4*    No results for input(s): "LIPASE", "AMYLASE" in the last 168 hours. No results for input(s): "AMMONIA" in the last 168 hours. Coagulation Profile: Recent Labs   Lab 02/28/22 2239  INR 1.3*    Cardiac Enzymes: No results for input(s): "CKTOTAL", "CKMB", "CKMBINDEX", "TROPONINI" in the last 168 hours. BNP (last 3 results) No results for input(s): "PROBNP" in the last 8760 hours. HbA1C: No results for input(s): "HGBA1C" in the last 72 hours. CBG: No results for input(s): "GLUCAP" in the last 168 hours. Lipid Profile: No results for input(s): "CHOL", "HDL", "LDLCALC", "TRIG", "CHOLHDL", "LDLDIRECT" in the last 72 hours. Thyroid Function Tests: No results for input(s): "TSH", "T4TOTAL", "FREET4", "T3FREE", "THYROIDAB" in the last 72 hours. Anemia Panel: No results for input(s): "  VITAMINB12", "FOLATE", "FERRITIN", "TIBC", "IRON", "RETICCTPCT" in the last 72 hours. Sepsis Labs: Recent Labs  Lab 02/28/22 2109 02/28/22 2239  LATICACIDVEN 1.3 0.9     No results found for this or any previous visit (from the past 240 hour(s)).       Radiology Studies: DG Abd 2 Views  Result Date: 03/02/2022 CLINICAL DATA:  Abdominal pain, history of rectal bleeding EXAM: ABDOMEN - 2 VIEW COMPARISON:  None Available. FINDINGS: Bowel gas pattern is nonspecific. No abnormal masses or calcifications are seen. There is prosthetic cardiac valve. There is right shoulder arthroplasty. Surgical fusion is seen in the lumbar spine. IMPRESSION: Nonspecific bowel gas pattern. Electronically Signed   By: Elmer Picker M.D.   On: 03/02/2022 10:31        Scheduled Meds:  [BHA Hold] sodium chloride   Intravenous Once   [MAR Hold] folic acid  1 mg Oral Daily   [MAR Hold] levothyroxine  88 mcg Oral q morning   [MAR Hold] pantoprazole (PROTONIX) IV  40 mg Intravenous Q12H   [MAR Hold] predniSONE  5 mg Oral Q breakfast   [MAR Hold] tamsulosin  0.4 mg Oral Daily   Continuous Infusions:  amiodarone 30 mg/hr (03/03/22 1446)     LOS: 3 days    Time spent: 45 minutes    Daymien Goth A Kinzi Frediani, MD [facility]   If 7PM-7AM, please contact  night-coverage [email or web address]  03/03/2022, 2:49 PM

## 2022-03-03 NOTE — Progress Notes (Addendum)
[facility] Progress Note  Joshua Hudson 87 y.o. 18-Jan-1935  CC: GI bleed   Subjective: Patient seen and examined at bedside.  Yesterday's events noted.  Patient underwent A-fib with RVR and amiodarone was switched to IV by cardiology.  Doing better this morning.  Only completed three fourth of the prep.  Having yellow-colored  bowel movement now.  ROS : Afebrile, negative for nausea and vomiting  Objective: Vital signs in last 24 hours: Vitals:   03/03/22 0400 03/03/22 0941  BP: 111/70 (!) 116/98  Pulse: 87 76  Resp: 16 18  Temp: 97.8 F (36.6 C) 98.3 F (36.8 C)  SpO2: 99% 100%    Physical Exam:  General:  Alert, cooperative, no distress, appears stated age  Head:  Normocephalic, without obvious abnormality, atraumatic  Eyes:  , EOM's intact,   Lungs:   No visible respiratory distress  Heart:  Regular rate and rhythm,  Abdomen:   Soft, non-tender, bowel sounds active all four quadrants,  no masses,           Lab Results: Recent Labs    03/01/22 0630 03/02/22 0519 03/02/22 1513  NA 142 141 138  K 3.9 4.4 4.1  CL 109 110 107  CO2 '24 22 23  '$ GLUCOSE 88 81 126*  BUN 34* 27* 32*  CREATININE 2.13* 1.69* 1.82*  CALCIUM 8.2* 8.3* 8.3*  MG 2.1  --  2.1  PHOS 3.5  --   --    Recent Labs    02/28/22 1650 03/01/22 0630  AST 31 25  ALT 19 17  ALKPHOS 47 42  BILITOT 0.7 1.2  PROT 5.9* 5.5*  ALBUMIN 3.6 3.4*   Recent Labs    03/02/22 1204 03/02/22 2227  WBC 7.6 8.4  HGB 9.7* 9.2*  HCT 32.1* 29.7*  MCV 80.7 78.2*  PLT 85* 102*   Recent Labs    02/28/22 2239  LABPROT 15.6*  INR 1.3*      Assessment/Plan: Assessment ------------------ - -Melena.  EGD earlier this month showed erosive gastropathy but no active bleeding.  History of right-sided colonic AVM.  Capsule endoscopy was unsuccessful as capsule retained in the stomach during the entire study. -A-fib with RVR.  Was seen by cardiology.  Echo showed normal EF. -Acute on chronic  blood loss anemia. -History of atrial fibrillation.  Eliquis on hold   -Abnormal CT scan concerning for proctitis.  Favoring infection rather than ischemic   Recommendations ------------------------- -Proceed with push enteroscopy and colonoscopy today.  Patient was seen by cardiology.  Appreciate their input.  They are aware of upcoming endoscopic procedure. -Patient was discussed with hospital attending yesterday and today.  Risks (bleeding, infection, bowel perforation that could require surgery, sedation-related changes in cardiopulmonary systems), benefits (identification and possible treatment of source of symptoms, exclusion of certain causes of symptoms), and alternatives (watchful waiting, radiographic imaging studies, empiric medical treatment)  were explained to patient/family in detail and patient wishes to proceed.    Otis Brace MD, [place] 03/03/2022, 11:23 AM  Contact #  [phone]

## 2022-03-03 NOTE — Brief Op Note (Signed)
02/28/2022 - 03/03/2022  2:45 PM  PATIENT:  Joshua Hudson  87 y.o. male  PRE-OPERATIVE DIAGNOSIS:  Melena, symptomatic anemia  POST-OPERATIVE DIAGNOSIS:  AVM in jejunum treated with APC and clip placement, hemorrhoids, cecal AVM treated with APC, ascending colon polypectomy  PROCEDURE:  Procedure(s): ENTEROSCOPY (N/A) COLONOSCOPY (N/A) HOT HEMOSTASIS (ARGON PLASMA COAGULATION/BICAP) (N/A) HEMOSTASIS CLIP PLACEMENT POLYPECTOMY  SURGEON:  Surgeon(s) and Role:    * Hazyl Marseille, MD - Primary  Findings ---------- -Push enteroscopy showed few AVMs in the duodenum with stigmata of recent bleeding.  Treated with APC and clip placement.  Also showed scattered AVMs in the proximal jejunum treated with APC. -Colonoscopy showed normal TI, few small AVMs, nonbleeding, in the cecum and ascending colon treated with APC.  Small ascending colon polyp removed with biopsy forcep.  No evidence of active bleeding.  Recommendation ---------------------- -Okay to resume tomorrow if hemoglobin remains stable.  Recommend to start with low-dose anticoagulation at least for a week and then increase it to require dose if hemoglobin remained stable. -Start soft diet and advance as tolerated. -Monitor H&H. -No further inpatient GI work-up planned.  GI will sign off.  Call us back if needed  Otis Brace MD, [place] 03/03/2022, 2:47 PM  Contact #  [phone]

## 2022-03-03 NOTE — Progress Notes (Signed)
Patient returned from Endoscopy. RN in Endoscopy reported Patient sustained skin tear to right forearm during removal of tape from old IV site. Area assessed and cleansed and foam applied

## 2022-03-03 NOTE — Anesthesia Preprocedure Evaluation (Signed)
Anesthesia Evaluation  Patient identified by MRN, date of birth, ID band Patient awake    Reviewed: Allergy & Precautions, NPO status , Patient's Chart, lab work & pertinent test results  History of Anesthesia Complications Negative for: history of anesthetic complications  Airway Mallampati: II  TM Distance: >3 FB Neck ROM: Full    Dental no notable dental hx. (+) Teeth Intact, Dental Advisory Given, Poor Dentition   Pulmonary former smoker,    Pulmonary exam normal        Cardiovascular hypertension, Pt. on medications +CHF  Normal cardiovascular exam+ dysrhythmias Atrial Fibrillation   02/25/20 Echo 1. Left ventricular ejection fraction, by estimation, is 45 to 50%. The  left ventricle has mildly decreased function. The left ventricle has no  regional wall motion abnormalities. Left ventricular diastolic parameters  are consistent with Grade II  diastolic dysfunction (pseudonormalization   Neuro/Psych    GI/Hepatic GERD  Medicated and Controlled,  Endo/Other  Hypothyroidism   Renal/GU      Musculoskeletal  (+) Arthritis , Rheumatoid disorders,    Abdominal   Peds  Hematology  (+) Blood dyscrasia, anemia , Lab Results      Component                Value               Date                      WBC                      8.4                 02/23/2022                HGB                      8.8 (L)             02/23/2022                HCT                      28.3 (L)            02/23/2022                MCV                      78.2 (L)            02/23/2022                PLT                      131 (L)             02/23/2022              Anesthesia Other Findings   Reproductive/Obstetrics                             Anesthesia Physical  Anesthesia Plan  ASA: 3  Anesthesia Plan: MAC   Post-op Pain Management: Minimal or no pain anticipated   Induction:   PONV Risk Score  and Plan: Treatment may vary due to age or medical condition and Propofol infusion  Airway Management Planned: Natural Airway and  Nasal Cannula  Additional Equipment: None  Intra-op Plan:   Post-operative Plan:   Informed Consent: I have reviewed the patients History and Physical, chart, labs and discussed the procedure including the risks, benefits and alternatives for the proposed anesthesia with the patient or authorized representative who has indicated his/her understanding and acceptance.   Patient has DNR.  Discussed DNR with patient and Suspend DNR.     Plan Discussed with: CRNA  Anesthesia Plan Comments:         Anesthesia Quick Evaluation

## 2022-03-04 DIAGNOSIS — I5033 Acute on chronic diastolic (congestive) heart failure: Secondary | ICD-10-CM

## 2022-03-04 DIAGNOSIS — K922 Gastrointestinal hemorrhage, unspecified: Secondary | ICD-10-CM | POA: Diagnosis not present

## 2022-03-04 DIAGNOSIS — I484 Atypical atrial flutter: Secondary | ICD-10-CM

## 2022-03-04 LAB — CBC
HCT: 27.4 % — ABNORMAL LOW (ref 39.0–52.0)
HCT: 28.8 % — ABNORMAL LOW (ref 39.0–52.0)
Hemoglobin: 8.8 g/dL — ABNORMAL LOW (ref 13.0–17.0)
Hemoglobin: 9.1 g/dL — ABNORMAL LOW (ref 13.0–17.0)
MCH: 24.6 pg — ABNORMAL LOW (ref 26.0–34.0)
MCH: 24.5 pg — ABNORMAL LOW (ref 26.0–34.0)
MCHC: 32.1 g/dL (ref 30.0–36.0)
MCHC: 31.6 g/dL (ref 30.0–36.0)
MCV: 76.8 fL — ABNORMAL LOW (ref 80.0–100.0)
MCV: 77.6 fL — ABNORMAL LOW (ref 80.0–100.0)
Platelets: 107 10*3/uL — ABNORMAL LOW (ref 150–400)
Platelets: 117 10*3/uL — ABNORMAL LOW (ref 150–400)
RBC: 3.57 MIL/uL — ABNORMAL LOW (ref 4.22–5.81)
RBC: 3.71 MIL/uL — ABNORMAL LOW (ref 4.22–5.81)
RDW: 21.6 % — ABNORMAL HIGH (ref 11.5–15.5)
RDW: 21.5 % — ABNORMAL HIGH (ref 11.5–15.5)
WBC: 9.9 10*3/uL (ref 4.0–10.5)
WBC: 10.7 10*3/uL — ABNORMAL HIGH (ref 4.0–10.5)
nRBC: 0.2 % (ref 0.0–0.2)
nRBC: 0 % (ref 0.0–0.2)

## 2022-03-04 LAB — BASIC METABOLIC PANEL
Anion gap: 5 (ref 5–15)
BUN: 20 mg/dL (ref 8–23)
CO2: 24 mmol/L (ref 22–32)
Calcium: 8.2 mg/dL — ABNORMAL LOW (ref 8.9–10.3)
Chloride: 110 mmol/L (ref 98–111)
Creatinine, Ser: 1.35 mg/dL — ABNORMAL HIGH (ref 0.61–1.24)
GFR, Estimated: 51 mL/min — ABNORMAL LOW (ref >60)
Glucose, Bld: 81 mg/dL (ref 70–99)
Potassium: 3.8 mmol/L (ref 3.5–5.1)
Sodium: 139 mmol/L (ref 135–145)

## 2022-03-04 MED ORDER — FUROSEMIDE 40 MG PO TABS
40.0000 mg | ORAL_TABLET | Freq: Every day | ORAL | Status: DC
  Administered 2022-03-04 – 2022-03-06 (×3): 40 mg via ORAL
  Filled 2022-03-04 (×3): qty 1

## 2022-03-04 MED ORDER — AMIODARONE HCL 200 MG PO TABS
400.0000 mg | ORAL_TABLET | Freq: Two times a day (BID) | ORAL | Status: DC
  Administered 2022-03-04 – 2022-03-11 (×15): 400 mg via ORAL
  Filled 2022-03-04 (×15): qty 2

## 2022-03-04 MED ORDER — APIXABAN 2.5 MG PO TABS
2.5000 mg | ORAL_TABLET | Freq: Two times a day (BID) | ORAL | Status: DC
  Administered 2022-03-04 – 2022-03-11 (×15): 2.5 mg via ORAL
  Filled 2022-03-04 (×16): qty 1

## 2022-03-04 NOTE — Progress Notes (Addendum)
Progress Note  Patient Name: Joshua Hudson Date of Encounter: 03/04/2022  Primary Cardiologist: [name], MD     Patient Profile     87 y.o. male with a history of atrial flutter with prior ablation, 2/21, TAVR 8/20 rheumatoid arthritis, chronic anemia (hemoglobin 8) admitted with GI bleed and seen for atrial flutter (atypical)/fib with rapid ventricular response.  Outpatient oral amiodarone transition to treat IV,   Subjective   Feels better; swollen feet--had noted increase HR about 3 months ago  Inpatient Medications    Scheduled Meds:  sodium chloride   Intravenous Once   folic acid  1 mg Oral Daily   levothyroxine  88 mcg Oral q morning   pantoprazole (PROTONIX) IV  40 mg Intravenous Q12H   predniSONE  5 mg Oral Q breakfast   tamsulosin  0.4 mg Oral Daily   Continuous Infusions:  sodium chloride 20 mL/hr at 03/03/22 1615   amiodarone 30 mg/hr (03/04/22 0117)   PRN Meds: acetaminophen **OR** acetaminophen, ketotifen   Vital Signs    Vitals:   03/03/22 1853 03/03/22 2028 03/04/22 0031 03/04/22 0516  BP: 106/81 98/67 102/82 96/68  Pulse: 96 72 97 100  Resp: (!) '22 20 20 20  '$ Temp: 99.1 F (37.3 C) 98.4 F (36.9 C) 98.3 F (36.8 C) 97.9 F (36.6 C)  TempSrc: Oral Oral Oral Oral  SpO2: 99% 99% 99% 98%  Weight:      Height:        Intake/Output Summary (Last 24 hours) at 03/04/2022 0819 Last data filed at 03/04/2022 0700 Gross per 24 hour  Intake 1549.79 ml  Output --  Net 1549.79 ml   Filed Weights   03/01/22 1432  Weight: 48.8 kg    Telemetry    80-120 - Personally Reviewed  ECG     - Personally Reviewed  Physical Exam    GEN: No acute distress.   Neck: No JVD Cardiac:rapid and irregular RR, no murmurs, rubs, or gallops.  Respiratory: Clear to auscultation bilaterally. GI: Soft, nontender, non-distended  MS: 2+ edema; No deformity. Neuro:  Nonfocal  Psych: Normal affect   Labs    Chemistry Recent Labs  Lab 02/28/22 1650  02/28/22 1724 03/01/22 0630 03/02/22 0519 03/02/22 1513 03/04/22 0538  NA 139   < > 142 141 138 139  K 3.8   < > 3.9 4.4 4.1 3.8  CL 106   < > 109 110 107 110  CO2 24  --  '24 22 23 24  '$ GLUCOSE 100*   < > 88 81 126* 81  BUN 33*   < > 34* 27* 32* 20  CREATININE 2.34*   < > 2.13* 1.69* 1.82* 1.35*  CALCIUM 8.2*  --  8.2* 8.3* 8.3* 8.2*  PROT 5.9*  --  5.5*  --   --   --   ALBUMIN 3.6  --  3.4*  --   --   --   AST 31  --  25  --   --   --   ALT 19  --  17  --   --   --   ALKPHOS 47  --  42  --   --   --   BILITOT 0.7  --  1.2  --   --   --   GFRNONAA 27*  --  30* 39* 36* 51*  ANIONGAP 9  --  '9 9 8 5   '$ < > = values in this interval  not displayed.     Hematology Recent Labs  Lab 03/02/22 1204 03/02/22 2227 03/04/22 0538  WBC 7.6 8.4 9.9  RBC 3.98* 3.80* 3.57*  HGB 9.7* 9.2* 8.8*  HCT 32.1* 29.7* 27.4*  MCV 80.7 78.2* 76.8*  MCH 24.4* 24.2* 24.6*  MCHC 30.2 31.0 32.1  RDW 21.8* 21.4* 21.6*  PLT 85* 102* 107*    Cardiac EnzymesNo results for input(s): "TROPONINI" in the last 168 hours. No results for input(s): "TROPIPOC" in the last 168 hours.   BNP Recent Labs  Lab 02/28/22 1650  BNP 256.0*     DDimer No results for input(s): "DDIMER" in the last 168 hours.   Radiology    DG Abd 2 Views  Result Date: 03/02/2022 CLINICAL DATA:  Abdominal pain, history of rectal bleeding EXAM: ABDOMEN - 2 VIEW COMPARISON:  None Available. FINDINGS: Bowel gas pattern is nonspecific. No abnormal masses or calcifications are seen. There is prosthetic cardiac valve. There is right shoulder arthroplasty. Surgical fusion is seen in the lumbar spine. IMPRESSION: Nonspecific bowel gas pattern. Electronically Signed   By: Elmer Picker M.D.   On: 03/02/2022 10:31    Cardiac Studies     Assessment & Plan    Atrial flutter-atypical RVR  AS status post TAVR stable on echo  EF normal  Recurrent GI bleed  HFpEF  Anemia chronic microcytic     HFPEF aggravated by the  recurrence of atrial flutter a few months ago Will need to try and restore sinus but will take some time regarding anticoagulation issues 2/2 GI bleeding  Continue diuresis Continue amio cahnge to PO ;  BP precludes BB or CCB for augmented rate control, and dig not ideal    Needs anemia workup if not already done   For questions or updates, please contact [facility] Please consult [email or web address] for contact info under Cardiology/STEMI.      Signed, Virl Axe, MD  03/04/2022, 8:19 AM

## 2022-03-04 NOTE — Progress Notes (Signed)
PROGRESS NOTE    Joshua Hudson  FVC:[id number] DOB: 1935-06-13 DOA: 02/28/2022 PCP: Lavone Orn, MD   Brief Narrative: 87 year old with past medical history significant for hypertension, chronic combined CHF, paroxysmal A-fib on Eliquis, CKD 3B, rheumatoid arthritis, bioprosthetic AV, AICD, GERD, hypothyroidism who presents complaining of rectal bleeding, melena. Recent admission from 6/21 to 6/24 due to GI bleed, endoscopy done in 6/23 showed abnormal esophageal motility, erosive gastropathy with no stigmata of recent bleeding, GI recommended PPI for 1 month.  His Eliquis was on hold for 1 week.  He has not restarted Eliquis yet.  He received IV iron prior to discharge. T abdomen and pelvis without contrast: Showed suspect proctitis.   GI has been consulted and underwent  capsule endoscopy 6/28. Capsule appears to be in the stomach. X ray was ordered.   Underwent  enteroscopy and colonoscopy 6/30: Push enteroscopy showed few AVMs in the Duodenum with stigmata of recent bleeding.  Treated with APC and clip placement.  Also has scattered AVMs in the proximal jejunum treated with APC.  Colonoscopy showed normal TI, few a small AVM, nonbleeding in the cecum and ascending colon treated with APC.  Small ascending colon polyp removed.    Assessment & Plan:   Principal Problem:   GI bleed Active Problems:   GERD (gastroesophageal reflux disease)   Atrial flutter (HCC)   Elevated troponin   Microcytic anemia   Hypothyroidism  1-Recurrent GI bleed: Melena -Continue with IV Protonix. -He received one unit PRBC 6/28. Hb increased to 9. Has remain stable -underwent Capsule endoscopy 6/28. Capsule appears to be in the stomach per GI notes.  -Push enteroscopy showed few AVMs in the Duodenum with stigmata of recent bleeding.  Treated with APC and clip placement.  Also has scattered AVMs in the proximal jejunum treated with APC.  Colonoscopy showed normal TI, few a small AVM, nonbleeding in the  cecum and ascending colon treated with APC.  Small ascending colon polyp removed. -Per GI ok to resume low dose anticoagulation today  (if hb remain stable)  for at least one week.  -Started  soft diet.  Monitor hb. Hb at 9. Plan to resume low dose eliquis and monitor.   2-Microcytic anemia: Iron deficiency anemia, acute blood loss in the setting of GI bleed: Received one unit PRBC>  Hb stable   3-Chronic A fib: A fib RVR On oral amiodarone at home  Holding Eliquis. Might be able to resume tomorrow low dose if hb remain stable.  Started on IV amiodarone. Plan to transition to oral amiodarone.  Appreciate cardiology assistance.    Elevated troponin, the setting of demand ischemia: Denies chest pain. Keep hemoglobin above 8.  Prolonged QT: Avoid prolonged QT medication. Replete electrolytes as needed. Mg 2.1  Chronic diastolic heart failure, history of aortic valve replacement:  He received IV Lasix with blood transfusion 6/29 Lasix oral resume.   GERD; PPI  Hypothyroidism; Continue with synthroid.   RA; on chronic low dose prednisone resume. Hold Avara due to thrombocytopenia,  BPH; Resume flomax. Hold finasteride to avoid further hypotension.   AKI on CKD stage IIIb;  Prior Cr 1.7---1.8 Cr down to 1.6.  Creatinine peak to 2.8  Estimated body mass index is 19.06 kg/m as calculated from the following:   Height as of this encounter: '5\' 3"'$  (1.6 m).   Weight as of this encounter: 48.8 kg.   DVT prophylaxis: SCD Code Status: DNR Family Communication: family at bedside.  Disposition Plan:  Status  is: Inpatient Remains inpatient appropriate because: management of GI bleed.     Consultants:  GI  Procedures:  Capsule endoscopy   Antimicrobials:    Subjective: He denies chest pain. Denies black stool.   Objective: Vitals:   03/03/22 2028 03/04/22 0031 03/04/22 0516 03/04/22 0907  BP: 98/67 1'02/82 96/68 96/70 '$  Pulse: 72 97 100 (!) 110  Resp: '20 20 20    '$ Temp: 98.4 F (36.9 C) 98.3 F (36.8 C) 97.9 F (36.6 C) 98.2 F (36.8 C)  TempSrc: Oral Oral Oral   SpO2: 99% 99% 98% 100%  Weight:      Height:        Intake/Output Summary (Last 24 hours) at 03/04/2022 1326 Last data filed at 03/04/2022 0935 Gross per 24 hour  Intake 2173.08 ml  Output --  Net 2173.08 ml    Filed Weights   03/01/22 1432  Weight: 48.8 kg    Examination:  General exam: NAD Respiratory system: CTA Cardiovascular system: S 1, S 2 IRR Gastrointestinal system: BS present, soft, nt Central nervous system: Alert, follows command Extremities: plus 2 edema   Data Reviewed: I have personally reviewed following labs and imaging studies  CBC: Recent Labs  Lab 03/02/22 0519 03/02/22 1204 03/02/22 2227 03/04/22 0538 03/04/22 1256  WBC 6.8 7.6 8.4 9.9 10.7*  HGB 9.2* 9.7* 9.2* 8.8* 9.1*  HCT 29.5* 32.1* 29.7* 27.4* 28.8*  MCV 78.0* 80.7 78.2* 76.8* 77.6*  PLT 92* 85* 102* 107* 117*    Basic Metabolic Panel: Recent Labs  Lab 02/28/22 1650 02/28/22 1724 02/28/22 2109 03/01/22 0630 03/02/22 0519 03/02/22 1513 03/04/22 0538  NA 139 137  --  142 141 138 139  K 3.8 3.7  --  3.9 4.4 4.1 3.8  CL 106 104  --  109 110 107 110  CO2 24  --   --  '24 22 23 24  '$ GLUCOSE 100* 101*  --  88 81 126* 81  BUN 33* 32*  --  34* 27* 32* 20  CREATININE 2.34* 2.80*  --  2.13* 1.69* 1.82* 1.35*  CALCIUM 8.2*  --   --  8.2* 8.3* 8.3* 8.2*  MG  --   --  1.9 2.1  --  2.1  --   PHOS  --   --   --  3.5  --   --   --     GFR: Estimated Creatinine Clearance: 27.6 mL/min (A) (by C-G formula based on SCr of 1.35 mg/dL (H)). Liver Function Tests: Recent Labs  Lab 02/28/22 1650 03/01/22 0630  AST 31 25  ALT 19 17  ALKPHOS 47 42  BILITOT 0.7 1.2  PROT 5.9* 5.5*  ALBUMIN 3.6 3.4*    No results for input(s): "LIPASE", "AMYLASE" in the last 168 hours. No results for input(s): "AMMONIA" in the last 168 hours. Coagulation Profile: Recent Labs  Lab 02/28/22 2239  INR  1.3*    Cardiac Enzymes: No results for input(s): "CKTOTAL", "CKMB", "CKMBINDEX", "TROPONINI" in the last 168 hours. BNP (last 3 results) No results for input(s): "PROBNP" in the last 8760 hours. HbA1C: No results for input(s): "HGBA1C" in the last 72 hours. CBG: No results for input(s): "GLUCAP" in the last 168 hours. Lipid Profile: No results for input(s): "CHOL", "HDL", "LDLCALC", "TRIG", "CHOLHDL", "LDLDIRECT" in the last 72 hours. Thyroid Function Tests: No results for input(s): "TSH", "T4TOTAL", "FREET4", "T3FREE", "THYROIDAB" in the last 72 hours. Anemia Panel: No results for input(s): "VITAMINB12", "FOLATE", "FERRITIN", "TIBC", "IRON", "RETICCTPCT"  in the last 72 hours. Sepsis Labs: Recent Labs  Lab 02/28/22 2109 02/28/22 2239  LATICACIDVEN 1.3 0.9     No results found for this or any previous visit (from the past 240 hour(s)).       Radiology Studies: No results found.      Scheduled Meds:  sodium chloride   Intravenous Once   amiodarone  400 mg Oral BID   apixaban  2.5 mg Oral BID   folic acid  1 mg Oral Daily   furosemide  40 mg Oral Daily   levothyroxine  88 mcg Oral q morning   pantoprazole (PROTONIX) IV  40 mg Intravenous Q12H   predniSONE  5 mg Oral Q breakfast   tamsulosin  0.4 mg Oral Daily   Continuous Infusions:  sodium chloride 20 mL/hr at 03/04/22 0935     LOS: 4 days    Time spent: 30 minutes    Alexys Lobello A Ashford Clouse, MD [facility]   If 7PM-7AM, please contact night-coverage [email or web address]  03/04/2022, 1:26 PM

## 2022-03-04 NOTE — Evaluation (Signed)
Physical Therapy Evaluation Patient Details Name: Joshua Hudson MRN: [id number] DOB: [birth year] Today's Date: 03/04/2022  History of Present Illness  87 yo male admitted with recurrent GI bleed, anemia. s/p polypectomy, hemostasis clip, colonoscopy, enteroscopy 6/30. Hx of CHF, Afib, CKD, RA, AICD  Clinical Impression  On eval, pt was Min guard A for mobility. He walked ~250 feet with use of IV pole. HR up to 129 bpm, O2 >90%. Pt did c/o some dyspnea with exertion. Will plan to follow pt during this hospital stay. Do not anticipate any f/u PT needs at this time.        Recommendations for follow up therapy are one component of a multi-disciplinary discharge planning process, led by the attending physician.  Recommendations may be updated based on patient status, additional functional criteria and insurance authorization.  Follow Up Recommendations No PT follow up      Assistance Recommended at Discharge PRN  Patient can return home with the following  Assistance with cooking/housework;Assist for transportation;Help with stairs or ramp for entrance    Equipment Recommendations None recommended by PT  Recommendations for Other Services       Functional Status Assessment Patient has had a recent decline in their functional status and demonstrates the ability to make significant improvements in function in a reasonable and predictable amount of time.     Precautions / Restrictions Precautions Precautions: Fall Precaution Comments: monitor HR Restrictions Weight Bearing Restrictions: No      Mobility  Bed Mobility               General bed mobility comments: in recliner    Transfers Overall transfer level: Needs assistance   Transfers: Sit to/from Stand Sit to Stand: Supervision                Ambulation/Gait Ambulation/Gait assistance: Min guard Gait Distance (Feet): 250 Feet Assistive device: IV Pole Gait Pattern/deviations: Step-through pattern,  Decreased stride length       General Gait Details: Moves quickly. Requires at least 1 point of support-used IV pole on today. HR up to 129 bpm, O2 95% on RA, dyspnea 2/4.  Stairs            Wheelchair Mobility    Modified Rankin (Stroke Patients Only)       Balance Overall balance assessment: Mild deficits observed, not formally tested                                           Pertinent Vitals/Pain Pain Assessment Pain Assessment: No/denies pain    Home Living Family/patient expects to be discharged to:: Private residence Living Arrangements: Alone Available Help at Discharge: Family;Available PRN/intermittently Type of Home: House Home Access: Stairs to enter Entrance Stairs-Rails: Psychiatric nurse of Steps: 3   Home Layout: One level Home Equipment: Grab bars - tub/shower;Grab bars - toilet;Adaptive equipment Additional Comments: pt states he does not have RW,    Prior Function Prior Level of Function : Independent/Modified Independent;Driving             Mobility Comments: drives , lives alone ADLs Comments: Daughter grocery shops with him.     Hand Dominance   Dominant Hand: Right    Extremity/Trunk Assessment             Cervical / Trunk Assessment Cervical / Trunk Assessment: Normal  Communication   Communication: No  difficulties  Cognition Arousal/Alertness: Awake/alert Behavior During Therapy: WFL for tasks assessed/performed Overall Cognitive Status: Within Functional Limits for tasks assessed                                          General Comments      Exercises     Assessment/Plan    PT Assessment Patient needs continued PT services  PT Problem List Decreased mobility;Decreased activity tolerance;Decreased balance       PT Treatment Interventions Gait training;Functional mobility training;Balance training;Therapeutic exercise;Therapeutic activities;Patient/family  education    PT Goals (Current goals can be found in the Care Plan section)  Acute Rehab PT Goals Patient Stated Goal: to be independent back home PT Goal Formulation: With patient Time For Goal Achievement: 03/18/22 Potential to Achieve Goals: Good    Frequency Min 3X/week     Co-evaluation               AM-PAC PT "6 Clicks" Mobility  Outcome Measure Help needed turning from your back to your side while in a flat bed without using bedrails?: None Help needed moving from lying on your back to sitting on the side of a flat bed without using bedrails?: None Help needed moving to and from a bed to a chair (including a wheelchair)?: None Help needed standing up from a chair using your arms (e.g., wheelchair or bedside chair)?: None Help needed to walk in hospital room?: A Little Help needed climbing 3-5 steps with a railing? : A Little 6 Click Score: 22    End of Session   Activity Tolerance: Patient tolerated treatment well Patient left: in chair;with call bell/phone within reach;with family/visitor present        Time: 2637-8588 PT Time Calculation (min) (ACUTE ONLY): 15 min   Charges:   PT Evaluation $PT Eval Low Complexity: [place], PT Acute Rehabilitation  Office: [phone] Pager: [phone]

## 2022-03-05 DIAGNOSIS — K922 Gastrointestinal hemorrhage, unspecified: Secondary | ICD-10-CM | POA: Diagnosis not present

## 2022-03-05 LAB — BASIC METABOLIC PANEL
Anion gap: 7 (ref 5–15)
BUN: 26 mg/dL — ABNORMAL HIGH (ref 8–23)
CO2: 25 mmol/L (ref 22–32)
Calcium: 8.4 mg/dL — ABNORMAL LOW (ref 8.9–10.3)
Chloride: 111 mmol/L (ref 98–111)
Creatinine, Ser: 1.6 mg/dL — ABNORMAL HIGH (ref 0.61–1.24)
GFR, Estimated: 42 mL/min — ABNORMAL LOW (ref >60)
Glucose, Bld: 84 mg/dL (ref 70–99)
Potassium: 3 mmol/L — ABNORMAL LOW (ref 3.5–5.1)
Sodium: 143 mmol/L (ref 135–145)

## 2022-03-05 LAB — CBC
HCT: 28.3 % — ABNORMAL LOW (ref 39.0–52.0)
Hemoglobin: 9 g/dL — ABNORMAL LOW (ref 13.0–17.0)
MCH: 24.4 pg — ABNORMAL LOW (ref 26.0–34.0)
MCHC: 31.8 g/dL (ref 30.0–36.0)
MCV: 76.7 fL — ABNORMAL LOW (ref 80.0–100.0)
Platelets: 113 10*3/uL — ABNORMAL LOW (ref 150–400)
RBC: 3.69 MIL/uL — ABNORMAL LOW (ref 4.22–5.81)
RDW: 21.9 % — ABNORMAL HIGH (ref 11.5–15.5)
WBC: 7.5 10*3/uL (ref 4.0–10.5)
nRBC: 0.3 % — ABNORMAL HIGH (ref 0.0–0.2)

## 2022-03-05 MED ORDER — TRAZODONE HCL 50 MG PO TABS
50.0000 mg | ORAL_TABLET | Freq: Every evening | ORAL | Status: DC | PRN
Start: 2022-03-05 — End: 2022-03-11
  Administered 2022-03-05 – 2022-03-10 (×6): 50 mg via ORAL
  Filled 2022-03-05 (×6): qty 1

## 2022-03-05 MED ORDER — SODIUM CHLORIDE 0.9 % IV SOLN
250.0000 mg | Freq: Once | INTRAVENOUS | Status: AC
  Administered 2022-03-06: 250 mg via INTRAVENOUS
  Filled 2022-03-05: qty 20

## 2022-03-05 MED ORDER — PANTOPRAZOLE SODIUM 40 MG PO TBEC
40.0000 mg | DELAYED_RELEASE_TABLET | Freq: Two times a day (BID) | ORAL | Status: DC
  Administered 2022-03-05 – 2022-03-11 (×13): 40 mg via ORAL
  Filled 2022-03-05 (×14): qty 1

## 2022-03-05 MED ORDER — SACCHAROMYCES BOULARDII 250 MG PO CAPS
250.0000 mg | ORAL_CAPSULE | Freq: Two times a day (BID) | ORAL | Status: DC
  Administered 2022-03-05 – 2022-03-11 (×11): 250 mg via ORAL
  Filled 2022-03-05 (×12): qty 1

## 2022-03-05 MED ORDER — POTASSIUM CHLORIDE CRYS ER 20 MEQ PO TBCR
20.0000 meq | EXTENDED_RELEASE_TABLET | Freq: Once | ORAL | Status: AC
  Administered 2022-03-05: 20 meq via ORAL
  Filled 2022-03-05: qty 1

## 2022-03-05 NOTE — Progress Notes (Addendum)
PROGRESS NOTE    Joshua Hudson  QIH:[id number] DOB: March 15, 1935 DOA: 02/28/2022 PCP: Lavone Orn, MD   Brief Narrative: 87 year old with past medical history significant for hypertension, chronic combined CHF, paroxysmal A-fib on Eliquis, CKD 3B, rheumatoid arthritis, bioprosthetic AV, AICD, GERD, hypothyroidism who presents complaining of rectal bleeding, melena. Recent admission from 6/21 to 6/24 due to GI bleed, endoscopy done in 6/23 showed abnormal esophageal motility, erosive gastropathy with no stigmata of recent bleeding, GI recommended PPI for 1 month.  His Eliquis was on hold for 1 week.  He has not restarted Eliquis yet.  He received IV iron prior to discharge. T abdomen and pelvis without contrast: Showed suspect proctitis.   GI has been consulted and underwent  capsule endoscopy 6/28. Capsule appears to be in the stomach. X ray was ordered.   Underwent  enteroscopy and colonoscopy 6/30: Push enteroscopy showed few AVMs in the Duodenum with stigmata of recent bleeding.  Treated with APC and clip placement.  Also has scattered AVMs in the proximal jejunum treated with APC.  Colonoscopy showed normal TI, few a small AVM, nonbleeding in the cecum and ascending colon treated with APC.  Small ascending colon polyp removed.    Assessment & Plan:   Principal Problem:   GI bleed Active Problems:   GERD (gastroesophageal reflux disease)   Atrial flutter (HCC)   Elevated troponin   Microcytic anemia   Hypothyroidism  1-Recurrent GI bleed: Melena -Continue with IV Protonix. -He received one unit PRBC 6/28. Hb increased to 9. Has remain stable -underwent Capsule endoscopy 6/28. Capsule appears to be in the stomach per GI notes.  -Push enteroscopy showed few AVMs in the Duodenum with stigmata of recent bleeding.  Treated with APC and clip placement.  Also has scattered AVMs in the proximal jejunum treated with APC.  Colonoscopy showed normal TI, few a small AVM, nonbleeding in the  cecum and ascending colon treated with APC.  Small ascending colon polyp removed. -Per GI ok to resume low dose anticoagulation today  (if hb remain stable)  for at least one week.  -Started  soft diet.  Monitor hb. Hb at 9. Eliquis low dose resume on 7/01.  2-Microcytic anemia: Iron deficiency anemia, acute blood loss in the setting of GI bleed: Received one unit PRBC>  Hb stable at 9.  Denies melena.  He received IV Iron 6/24. Plan to repeat a dose tomorrow.   3-Chronic A fib: A fib RVR On oral amiodarone at home  Transition to oral amiodarone 7/01. Appreciate cardiology assistance.  Patient with SOB on exertion, cardiology recommend TEE cardioversion for 7/03.  Elevated troponin, the setting of demand ischemia: Denies chest pain. Keep hemoglobin above 8.  Prolonged QT: Avoid prolonged QT medication. Replete electrolytes as needed. Mg 2.1  Chronic diastolic heart failure, history of aortic valve replacement: He received IV Lasix with blood transfusion 6/29 Continue with oral lasix.   GERD; PPI  Hypothyroidism; Continue with synthroid.   RA; on chronic low dose prednisone resume. Hold Avara due to thrombocytopenia,  BPH; Resume flomax. Hold finasteride to avoid further hypotension.   AKI on CKD stage IIIb;  Prior Cr 1.7---1.8 Cr down to 1.6.  Creatinine peak to 2.8  Estimated body mass index is 19.06 kg/m as calculated from the following:   Height as of this encounter: '5\' 3"'$  (1.6 m).   Weight as of this encounter: 48.8 kg.   DVT prophylaxis: SCD Code Status: DNR Family Communication: family at bedside 7/01.Marland Kitchen  Disposition Plan:  Status is: Inpatient Remains inpatient appropriate because: management of GI bleed.     Consultants:  GI  Procedures:  Capsule endoscopy   Antimicrobials:    Subjective: Denies Black stool.  Report SOB on exertion.  He does report loose stool.   Objective: Vitals:   03/04/22 2041 03/04/22 2300 03/05/22 0537 03/05/22  1248  BP: (!) 92/59  101/65 (!) 100/57  Pulse: 100  86 79  Resp: '20 16 20   '$ Temp: 98.4 F (36.9 C)  97.9 F (36.6 C) 98.6 F (37 C)  TempSrc: Oral  Oral Oral  SpO2: 98%  98% 99%  Weight:      Height:        Intake/Output Summary (Last 24 hours) at 03/05/2022 1629 Last data filed at 03/05/2022 1256 Gross per 24 hour  Intake 360 ml  Output --  Net 360 ml    Filed Weights   03/01/22 1432  Weight: 48.8 kg    Examination:  General exam: NAD Respiratory system: CTA Cardiovascular system: S 1, S 2 RRR Gastrointestinal system: BS present, soft, nt Central nervous system: Alert, follows command Extremities:Plus 2 edema   Data Reviewed: I have personally reviewed following labs and imaging studies  CBC: Recent Labs  Lab 03/02/22 1204 03/02/22 2227 03/04/22 0538 03/04/22 1256 03/05/22 0815  WBC 7.6 8.4 9.9 10.7* 7.5  HGB 9.7* 9.2* 8.8* 9.1* 9.0*  HCT 32.1* 29.7* 27.4* 28.8* 28.3*  MCV 80.7 78.2* 76.8* 77.6* 76.7*  PLT 85* 102* 107* 117* 113*    Basic Metabolic Panel: Recent Labs  Lab 02/28/22 2109 03/01/22 0630 03/02/22 0519 03/02/22 1513 03/04/22 0538 03/05/22 0815  NA  --  142 141 138 139 143  K  --  3.9 4.4 4.1 3.8 3.0*  CL  --  109 110 107 110 111  CO2  --  '24 22 23 24 25  '$ GLUCOSE  --  88 81 126* 81 84  BUN  --  34* 27* 32* 20 26*  CREATININE  --  2.13* 1.69* 1.82* 1.35* 1.60*  CALCIUM  --  8.2* 8.3* 8.3* 8.2* 8.4*  MG 1.9 2.1  --  2.1  --   --   PHOS  --  3.5  --   --   --   --     GFR: Estimated Creatinine Clearance: 23.3 mL/min (A) (by C-G formula based on SCr of 1.6 mg/dL (H)). Liver Function Tests: Recent Labs  Lab 02/28/22 1650 03/01/22 0630  AST 31 25  ALT 19 17  ALKPHOS 47 42  BILITOT 0.7 1.2  PROT 5.9* 5.5*  ALBUMIN 3.6 3.4*    No results for input(s): "LIPASE", "AMYLASE" in the last 168 hours. No results for input(s): "AMMONIA" in the last 168 hours. Coagulation Profile: Recent Labs  Lab 02/28/22 2239  INR 1.3*     Cardiac Enzymes: No results for input(s): "CKTOTAL", "CKMB", "CKMBINDEX", "TROPONINI" in the last 168 hours. BNP (last 3 results) No results for input(s): "PROBNP" in the last 8760 hours. HbA1C: No results for input(s): "HGBA1C" in the last 72 hours. CBG: No results for input(s): "GLUCAP" in the last 168 hours. Lipid Profile: No results for input(s): "CHOL", "HDL", "LDLCALC", "TRIG", "CHOLHDL", "LDLDIRECT" in the last 72 hours. Thyroid Function Tests: No results for input(s): "TSH", "T4TOTAL", "FREET4", "T3FREE", "THYROIDAB" in the last 72 hours. Anemia Panel: No results for input(s): "VITAMINB12", "FOLATE", "FERRITIN", "TIBC", "IRON", "RETICCTPCT" in the last 72 hours. Sepsis Labs: Recent Labs  Lab 02/28/22  2109 02/28/22 2239  LATICACIDVEN 1.3 0.9     No results found for this or any previous visit (from the past 240 hour(s)).       Radiology Studies: No results found.      Scheduled Meds:  sodium chloride   Intravenous Once   amiodarone  400 mg Oral BID   apixaban  2.5 mg Oral BID   folic acid  1 mg Oral Daily   furosemide  40 mg Oral Daily   levothyroxine  88 mcg Oral q morning   pantoprazole  40 mg Oral BID   predniSONE  5 mg Oral Q breakfast   tamsulosin  0.4 mg Oral Daily   Continuous Infusions:  sodium chloride 20 mL/hr at 03/04/22 0935     LOS: 5 days    Time spent: 45 minutes    Quantia Grullon A Kaiden Dardis, MD [facility]   If 7PM-7AM, please contact night-coverage [email or web address]  03/05/2022, 4:29 PM

## 2022-03-05 NOTE — Progress Notes (Addendum)
Progress Note  Patient Name: Joshua Hudson Date of Encounter: 03/05/2022  Primary Cardiologist: [name], MD     Patient Profile     87 y.o. male with a history of atrial flutter with prior ablation, 2/21, TAVR 8/20 rheumatoid arthritis, chronic anemia (hemoglobin 8) admitted with GI bleed and seen for atrial flutter (atypical)/fib with rapid ventricular response.  Outpatient oral amiodarone transition to treat IV,  With increase HR noted about 3 months ago, likely Aflutter RVR with 2/2 HFpEF  Subjective   Very dyspneic with minimal exertion associate with some lightheadedness..  Still with edema. Anticoagulation started yesterday per primary service  Inpatient Medications    Scheduled Meds:  sodium chloride   Intravenous Once   amiodarone  400 mg Oral BID   apixaban  2.5 mg Oral BID   folic acid  1 mg Oral Daily   furosemide  40 mg Oral Daily   levothyroxine  88 mcg Oral q morning   pantoprazole  40 mg Oral BID   predniSONE  5 mg Oral Q breakfast   tamsulosin  0.4 mg Oral Daily   Continuous Infusions:  sodium chloride 20 mL/hr at 03/04/22 0935   PRN Meds: acetaminophen **OR** acetaminophen, ketotifen   Vital Signs    Vitals:   03/04/22 1804 03/04/22 2041 03/04/22 2300 03/05/22 0537  BP: 132/62 (!) 92/59  101/65  Pulse: 100 100  86  Resp:  '20 16 20  '$ Temp: 98.4 F (36.9 C) 98.4 F (36.9 C)  97.9 F (36.6 C)  TempSrc:  Oral  Oral  SpO2: 99% 98%  98%  Weight:      Height:        Intake/Output Summary (Last 24 hours) at 03/05/2022 0958 Last data filed at 03/04/2022 1413 Gross per 24 hour  Intake 180 ml  Output --  Net 180 ml   Filed Weights   03/01/22 1432  Weight: 48.8 kg    Telemetry    Aflutter 70-130- Personally Reviewed  ECG     - Personally Reviewed  Physical Exam    Well developed and nourished in no acute distress HENT normal Neck supple with JVP-8-10 Carotids brisk and full without bruits Clear Irregularly irregular rate and  rhythm with rapid ventricular response, no murmurs or gallops Abd-soft with active BS without hepatomegaly No Clubbing cyanosis edema Skin-warm and dry A & Oriented  Grossly normal sensory and motor function    Labs    Chemistry Recent Labs  Lab 02/28/22 1650 02/28/22 1724 03/01/22 0630 03/02/22 0519 03/02/22 1513 03/04/22 0538 03/05/22 0815  NA 139   < > 142   < > 138 139 143  K 3.8   < > 3.9   < > 4.1 3.8 3.0*  CL 106   < > 109   < > 107 110 111  CO2 24  --  24   < > '23 24 25  '$ GLUCOSE 100*   < > 88   < > 126* 81 84  BUN 33*   < > 34*   < > 32* 20 26*  CREATININE 2.34*   < > 2.13*   < > 1.82* 1.35* 1.60*  CALCIUM 8.2*  --  8.2*   < > 8.3* 8.2* 8.4*  PROT 5.9*  --  5.5*  --   --   --   --   ALBUMIN 3.6  --  3.4*  --   --   --   --   AST  31  --  25  --   --   --   --   ALT 19  --  17  --   --   --   --   ALKPHOS 47  --  42  --   --   --   --   BILITOT 0.7  --  1.2  --   --   --   --   GFRNONAA 27*  --  30*   < > 36* 51* 42*  ANIONGAP 9  --  9   < > '8 5 7   '$ < > = values in this interval not displayed.     Hematology Recent Labs  Lab 03/04/22 0538 03/04/22 1256 03/05/22 0815  WBC 9.9 10.7* 7.5  RBC 3.57* 3.71* 3.69*  HGB 8.8* 9.1* 9.0*  HCT 27.4* 28.8* 28.3*  MCV 76.8* 77.6* 76.7*  MCH 24.6* 24.5* 24.4*  MCHC 32.1 31.6 31.8  RDW 21.6* 21.5* 21.9*  PLT 107* 117* 113*    Cardiac EnzymesNo results for input(s): "TROPONINI" in the last 168 hours. No results for input(s): "TROPIPOC" in the last 168 hours.   BNP Recent Labs  Lab 02/28/22 1650  BNP 256.0*     DDimer No results for input(s): "DDIMER" in the last 168 hours.   Radiology    No results found.  Cardiac Studies     Assessment & Plan    Atrial flutter-atypical RVR  AS status post TAVR stable on echo  EF normal  Recurrent GI bleed  HFpEF  Anemia chronic microcytic     Symptomatic with dyspnea and volume overload.  Probably multifactorial with his anemia as well as his atrial  arrhythmias with rapid rate.  Anticoagulation was started yesterday without significant change in hemoglobin; will try to arrange TEE cardioversion in the next 24-48 hours to facilitate improvement.  Suspect he has multifactorial anemia now with iron deficiency with a 15% saturation compared to similar anemia & microcytosis a couple years ago with a saturation of 40%.  May need hematological evaluation.  Spoke with primary service       For questions or updates, please contact [place] Please consult [email or web address] for contact info under Cardiology/STEMI.      Signed, Virl Axe, MD  03/05/2022, 9:58 AM

## 2022-03-06 ENCOUNTER — Inpatient Hospital Stay: Payer: Medicare PPO

## 2022-03-06 ENCOUNTER — Encounter: Payer: Self-pay | Admitting: Hematology and Oncology

## 2022-03-06 DIAGNOSIS — I5033 Acute on chronic diastolic (congestive) heart failure: Secondary | ICD-10-CM | POA: Diagnosis not present

## 2022-03-06 DIAGNOSIS — I484 Atypical atrial flutter: Secondary | ICD-10-CM | POA: Diagnosis not present

## 2022-03-06 DIAGNOSIS — K922 Gastrointestinal hemorrhage, unspecified: Secondary | ICD-10-CM | POA: Diagnosis not present

## 2022-03-06 LAB — BASIC METABOLIC PANEL
Anion gap: 10 (ref 5–15)
BUN: 32 mg/dL — ABNORMAL HIGH (ref 8–23)
CO2: 22 mmol/L (ref 22–32)
Calcium: 8.5 mg/dL — ABNORMAL LOW (ref 8.9–10.3)
Chloride: 111 mmol/L (ref 98–111)
Creatinine, Ser: 1.72 mg/dL — ABNORMAL HIGH (ref 0.61–1.24)
GFR, Estimated: 38 mL/min — ABNORMAL LOW (ref >60)
Glucose, Bld: 100 mg/dL — ABNORMAL HIGH (ref 70–99)
Potassium: 3.6 mmol/L (ref 3.5–5.1)
Sodium: 143 mmol/L (ref 135–145)

## 2022-03-06 LAB — CBC
HCT: 29.4 % — ABNORMAL LOW (ref 39.0–52.0)
Hemoglobin: 9.1 g/dL — ABNORMAL LOW (ref 13.0–17.0)
MCH: 24.4 pg — ABNORMAL LOW (ref 26.0–34.0)
MCHC: 31 g/dL (ref 30.0–36.0)
MCV: 78.8 fL — ABNORMAL LOW (ref 80.0–100.0)
Platelets: 114 10*3/uL — ABNORMAL LOW (ref 150–400)
RBC: 3.73 MIL/uL — ABNORMAL LOW (ref 4.22–5.81)
RDW: 22.6 % — ABNORMAL HIGH (ref 11.5–15.5)
WBC: 8 10*3/uL (ref 4.0–10.5)
nRBC: 0 % (ref 0.0–0.2)

## 2022-03-06 LAB — SURGICAL PATHOLOGY

## 2022-03-06 MED ORDER — POTASSIUM CHLORIDE CRYS ER 20 MEQ PO TBCR
20.0000 meq | EXTENDED_RELEASE_TABLET | Freq: Every day | ORAL | Status: DC
  Administered 2022-03-06 – 2022-03-11 (×5): 20 meq via ORAL
  Filled 2022-03-06 (×6): qty 1

## 2022-03-06 MED ORDER — FUROSEMIDE 40 MG PO TABS
80.0000 mg | ORAL_TABLET | Freq: Every day | ORAL | Status: DC
  Administered 2022-03-07: 80 mg via ORAL
  Filled 2022-03-06 (×2): qty 2

## 2022-03-06 MED ORDER — ORAL CARE MOUTH RINSE: 15.0000 mL | OROMUCOSAL | Status: DC | PRN

## 2022-03-06 NOTE — Progress Notes (Signed)
PROGRESS NOTE    Joshua Hudson  JFH:[id number] DOB: Jul 25, 1935 DOA: 02/28/2022 PCP: Lavone Orn, MD   Brief Narrative: 87 year old with past medical history significant for hypertension, chronic combined CHF, paroxysmal A-fib on Eliquis, CKD 3B, rheumatoid arthritis, bioprosthetic AV, AICD, GERD, hypothyroidism who presents complaining of rectal bleeding, melena. Recent admission from 6/21 to 6/24 due to GI bleed, endoscopy done in 6/23 showed abnormal esophageal motility, erosive gastropathy with no stigmata of recent bleeding, GI recommended PPI for 1 month.  His Eliquis was on hold for 1 week.  He has not restarted Eliquis yet.  He received IV iron prior to discharge. T abdomen and pelvis without contrast: Showed suspect proctitis.   GI has been consulted and underwent  capsule endoscopy 6/28. Capsule appears to be in the stomach. X ray was ordered.   Underwent  enteroscopy and colonoscopy 6/30: Push enteroscopy showed few AVMs in the Duodenum with stigmata of recent bleeding.  Treated with APC and clip placement.  Also has scattered AVMs in the proximal jejunum treated with APC.  Colonoscopy showed normal TI, few a small AVM, nonbleeding in the cecum and ascending colon treated with APC.  Small ascending colon polyp removed.    Assessment & Plan:   Principal Problem:   GI bleed Active Problems:   GERD (gastroesophageal reflux disease)   Atrial flutter (HCC)   Elevated troponin   Microcytic anemia   Hypothyroidism  1-Recurrent GI bleed: Melena -Continue with IV Protonix. -He received one unit PRBC 6/28. Hb increased to 9. Has remain stable -underwent Capsule endoscopy 6/28. Capsule appears to be in the stomach per GI notes.  -Push enteroscopy showed few AVMs in the Duodenum with stigmata of recent bleeding.  Treated with APC and clip placement.  Also has scattered AVMs in the proximal jejunum treated with APC.  Colonoscopy showed normal TI, few a small AVM, nonbleeding in the  cecum and ascending colon treated with APC.  Small ascending colon polyp removed. -Per GI ok to resume low dose anticoagulation 7/01  (if hb remain stable)  for at least one week.  -Started  soft diet.  Monitor hb. Hb at 9. Eliquis low dose resume on 7/01. No further bleeding.   2-Microcytic anemia: Iron deficiency anemia, acute blood loss in the setting of GI bleed: Received one unit PRBC>  Hb stable at 9.  Denies melena.  He received IV Iron 6/24. Plan to repeat a dose today.   3-Chronic A fib: A fib RVR On oral amiodarone at home  Transition to oral amiodarone 7/01. Appreciate cardiology assistance.  Patient with SOB on exertion, cardiology recommend TEE cardioversion, planned for 7/05.  Elevated troponin, the setting of demand ischemia: Denies chest pain. Keep hemoglobin above 8.  Prolonged QT: Avoid prolonged QT medication. Replete electrolytes as needed. Mg 2.1  Chronic diastolic heart failure, history of aortic valve replacement: He received IV Lasix with blood transfusion 6/29 Cardiology increasing lasix dose.   GERD; PPI  Hypothyroidism; Continue with synthroid.   RA; on chronic low dose prednisone resume. Hold Avara due to thrombocytopenia,  BPH; Resume flomax. Hold finasteride to avoid further hypotension.   AKI on CKD stage IIIb;  Prior Cr 1.7---1.8 Cr down to 1.6.  Creatinine peak to 2.8  Diarrhea; monitor. Started on [employer].   Estimated body mass index is 19.06 kg/m as calculated from the following:   Height as of this encounter: '5\' 3"'$  (1.6 m).   Weight as of this encounter: 48.8 kg.   DVT  prophylaxis: SCD Code Status: DNR Family Communication: family at bedside 7/03 Disposition Plan:  Status is: Inpatient Remains inpatient appropriate because: management of GI bleed.     Consultants:  GI  Procedures:  Capsule endoscopy   Antimicrobials:    Subjective: Report loose stool. He walk to the elevators, get SOB.  Denies melena    Objective: Vitals:   03/05/22 1248 03/05/22 2134 03/06/22 0614 03/06/22 0659  BP: (!) 100/57 102/69 (!) 92/47 106/76  Pulse: 79 76 93 93  Resp:  20 20   Temp: 98.6 F (37 C) 98.6 F (37 C) 97.8 F (36.6 C)   TempSrc: Oral Oral Oral   SpO2: 99% 99% 99%   Weight:      Height:        Intake/Output Summary (Last 24 hours) at 03/06/2022 0733 Last data filed at 03/05/2022 1857 Gross per 24 hour  Intake 480 ml  Output --  Net 480 ml    Filed Weights   03/01/22 1432  Weight: 48.8 kg    Examination:  General exam: NAD Respiratory system: CTA Cardiovascular system: S 1, S 2 IRR Gastrointestinal system: BS present, soft nt Central nervous system: Alert, follows command Extremities: Plus 2 edema   Data Reviewed: I have personally reviewed following labs and imaging studies  CBC: Recent Labs  Lab 03/02/22 1204 03/02/22 2227 03/04/22 0538 03/04/22 1256 03/05/22 0815  WBC 7.6 8.4 9.9 10.7* 7.5  HGB 9.7* 9.2* 8.8* 9.1* 9.0*  HCT 32.1* 29.7* 27.4* 28.8* 28.3*  MCV 80.7 78.2* 76.8* 77.6* 76.7*  PLT 85* 102* 107* 117* 113*    Basic Metabolic Panel: Recent Labs  Lab 02/28/22 2109 03/01/22 0630 03/02/22 0519 03/02/22 1513 03/04/22 0538 03/05/22 0815  NA  --  142 141 138 139 143  K  --  3.9 4.4 4.1 3.8 3.0*  CL  --  109 110 107 110 111  CO2  --  '24 22 23 24 25  '$ GLUCOSE  --  88 81 126* 81 84  BUN  --  34* 27* 32* 20 26*  CREATININE  --  2.13* 1.69* 1.82* 1.35* 1.60*  CALCIUM  --  8.2* 8.3* 8.3* 8.2* 8.4*  MG 1.9 2.1  --  2.1  --   --   PHOS  --  3.5  --   --   --   --     GFR: Estimated Creatinine Clearance: 23.3 mL/min (A) (by C-G formula based on SCr of 1.6 mg/dL (H)). Liver Function Tests: Recent Labs  Lab 02/28/22 1650 03/01/22 0630  AST 31 25  ALT 19 17  ALKPHOS 47 42  BILITOT 0.7 1.2  PROT 5.9* 5.5*  ALBUMIN 3.6 3.4*    No results for input(s): "LIPASE", "AMYLASE" in the last 168 hours. No results for input(s): "AMMONIA" in the last 168  hours. Coagulation Profile: Recent Labs  Lab 02/28/22 2239  INR 1.3*    Cardiac Enzymes: No results for input(s): "CKTOTAL", "CKMB", "CKMBINDEX", "TROPONINI" in the last 168 hours. BNP (last 3 results) No results for input(s): "PROBNP" in the last 8760 hours. HbA1C: No results for input(s): "HGBA1C" in the last 72 hours. CBG: No results for input(s): "GLUCAP" in the last 168 hours. Lipid Profile: No results for input(s): "CHOL", "HDL", "LDLCALC", "TRIG", "CHOLHDL", "LDLDIRECT" in the last 72 hours. Thyroid Function Tests: No results for input(s): "TSH", "T4TOTAL", "FREET4", "T3FREE", "THYROIDAB" in the last 72 hours. Anemia Panel: No results for input(s): "VITAMINB12", "FOLATE", "FERRITIN", "TIBC", "IRON", "RETICCTPCT" in  the last 72 hours. Sepsis Labs: Recent Labs  Lab 02/28/22 2109 02/28/22 2239  LATICACIDVEN 1.3 0.9     No results found for this or any previous visit (from the past 240 hour(s)).       Radiology Studies: No results found.      Scheduled Meds:  sodium chloride   Intravenous Once   amiodarone  400 mg Oral BID   apixaban  2.5 mg Oral BID   folic acid  1 mg Oral Daily   furosemide  40 mg Oral Daily   levothyroxine  88 mcg Oral q morning   pantoprazole  40 mg Oral BID   predniSONE  5 mg Oral Q breakfast   saccharomyces boulardii  250 mg Oral BID   tamsulosin  0.4 mg Oral Daily   Continuous Infusions:  sodium chloride 20 mL/hr at 03/04/22 0935   ferric gluconate (FERRLECIT) IVPB       LOS: 6 days    Time spent: 45 minutes    Graciemae Delisle A Indica Marcott, MD [facility]   If 7PM-7AM, please contact night-coverage [email or web address]  03/06/2022, 7:33 AM

## 2022-03-06 NOTE — Care Management Important Message (Signed)
Important Message  Patient Details IM Letter given to the Patient. Name: Joshua Hudson MRN: [id number] Date of Birth: 05/25/1935   Medicare Important Message Given:  Yes     Kerin Salen 03/06/2022, 2:32 PM

## 2022-03-06 NOTE — Progress Notes (Signed)
Patient Care Team: Lavone Orn, MD as PCP - General (Internal Medicine) Sueanne Margarita, MD as PCP - Cardiology (Cardiology) Sueanne Margarita, MD as Consulting Physician (Cardiology)  DIAGNOSIS:  Encounter Diagnoses  Name Primary?   Thrombocytopenia ([place])    Anemia of chronic disease Yes      CHIEF COMPLIANT: Follow-up of anemia, thrombocytopenia  INTERVAL HISTORY: Joshua Hudson is a 87 y.o. with above-mentioned history of anemia and thrombocytopenia currently on treatment with Aranesp. He presents to the clinic today for follow-up.  He was recently in the hospital for heart issues.  He received blood and iron infusion in the hospital.  He still feels extremely weak and tired.  His blood pressure is running low.  He is scheduled to undergo cardioversion tomorrow.   ALLERGIES:  has No Known Allergies.  MEDICATIONS:  Current Outpatient Medications  Medication Sig Dispense Refill   amiodarone (PACERONE) 200 MG tablet Take 1 tablet (200 mg total) by mouth daily. 30 tablet 2   amiodarone (PACERONE) 400 MG tablet Take 1 tablet (400 mg total) by mouth 2 (two) times daily for 7 days. 14 tablet 0   apixaban (ELIQUIS) 2.5 MG TABS tablet Take 1 tablet (2.5 mg total) by mouth 2 (two) times daily. 180 tablet 1   epoetin alfa-epbx (RETACRIT) 15176 UNIT/ML injection Inject 40,000 Units into the skin every 6 (six) weeks.     finasteride (PROSCAR) 5 MG tablet Take 5 mg by mouth daily as needed (retention).      ketotifen (ZADITOR) 0.025 % ophthalmic solution Place 1 drop into both eyes 2 (two) times daily as needed (itchy eyes).     levothyroxine (SYNTHROID) 88 MCG tablet Take 88 mcg by mouth every morning.     Multiple Vitamin (MULTIVITAMIN WITH MINERALS) TABS tablet Take 1 tablet by mouth daily.     pantoprazole (PROTONIX) 40 MG tablet Take 1 tablet (40 mg total) by mouth 2 (two) times daily before a meal for 30 days, THEN 1 tablet (40 mg total) daily. (Patient not taking: Reported on  02/28/2022) 120 tablet 0   potassium chloride SA (KLOR-CON) 20 MEQ tablet Take 1 tablet (20 mEq total) by mouth daily. NEED OV. (Patient taking differently: Take 20 mEq by mouth daily.) 90 tablet 0   predniSONE (DELTASONE) 5 MG tablet Take 5 mg by mouth daily with breakfast.      torsemide (DEMADEX) 20 MG tablet Take 1 tablet (20 mg total) by mouth daily. 30 tablet 0   No current facility-administered medications for this visit.    PHYSICAL EXAMINATION: ECOG PERFORMANCE STATUS: 1 - Symptomatic but completely ambulatory  Vitals:   03/20/22 0814  BP: (!) 88/67  Pulse: 71  Resp: 17  Temp: (!) 97.5 F (36.4 C)  SpO2: 100%   Filed Weights   03/20/22 0814  Weight: 105 lb 4.8 oz (47.8 kg)      LABORATORY DATA:  I have reviewed the data as listed    Latest Ref Rng & Units 03/11/2022    6:11 AM 03/10/2022    5:56 AM 03/09/2022    5:20 AM  CMP  Glucose 70 - 99 mg/dL 86  83  76   BUN 8 - 23 mg/dL 25  34  36   Creatinine 0.61 - 1.24 mg/dL 1.35  1.44  1.75   Sodium 135 - 145 mmol/L 144  142  141   Potassium 3.5 - 5.1 mmol/L 3.8  3.7  3.4   Chloride 98 - 111  mmol/L 113  110  107   CO2 22 - 32 mmol/L '25  25  26   '$ Calcium 8.9 - 10.3 mg/dL 8.3  8.5  8.5     Lab Results  Component Value Date   WBC 7.8 03/20/2022   HGB 7.1 (L) 03/20/2022   HCT 22.0 (L) 03/20/2022   MCV 73.8 (L) 03/20/2022   PLT 127 (L) 03/20/2022   NEUTROABS 6.7 03/20/2022    ASSESSMENT & PLAN:  Thrombocytopenia (HCC) Hemoglobin electrophoresis: Beta thalassemia minor.  This is the cause of microcytosis.  It is not iron deficiency related.   Possible differential: Anemia of chronic disease versus myelodysplastic syndrome versus methotrexate related toxicity.     Hospitalization 09/22/2019-09/25/2019: A. fib with RVR status post cardioversion, currently on Eliquis Hospitalization 02/28/2022-03/11/2022: Atrial fibrillation: Cardioversion planned for 03/22/2022.   Lab review: 09/25/2019: WBC 7.2, hemoglobin 9.6,  platelets 145 10/03/19: WBC: 7.5, Hb 8.9, Pl: 135 04/18/2021: Hemoglobin 8.7 (Retacrit), blood transfusion on 04/04/2021 for hemoglobin of 7.7  05/30/21: Hemoglobin 6.7, blood transfusion, platelets 85 (will need to be watched) 08/23/2021: Hemoglobin 9.6 (Retacrit) 10/04/2021: Hemoglobin 8, platelets 151 (2 units of PRBC) 12/05/2021: Hemoglobin 8.7 12/26/2021: Hemoglobin 7.1, platelets 118 03/08/22:  Hemoglobin 8.7, platelets 148 03/20/2022: Hemoglobin 7.1, platelets 127 (2 units of PRBC)   His anemia is due to chronic kidney disease stage III.   He will get 2 units of PRBC today. (Seems to get blood transfusions every 6 weeks)   Return to clinic in 3 weeks for labs and blood transfusion if needed. I will see him back in 6 weeks.    Orders Placed This Encounter  Procedures   Informed Consent Details: Physician/Practitioner Attestation; Transcribe to consent form and obtain patient signature    Standing Status:   Future    Standing Expiration Date:   03/21/2023    Order Specific Question:   Physician/Practitioner attestation of informed consent for blood and or blood product transfusion    Answer:   I, the physician/practitioner, attest that I have discussed with the patient the benefits, risks, side effects, alternatives, likelihood of achieving goals and potential problems during recovery for the procedure that I have provided informed consent.    Order Specific Question:   Product(s)    Answer:   All Product(s)   Care order/instruction    Transfuse Parameters    Standing Status:   Future    Standing Expiration Date:   03/21/2023   Type and screen         Standing Status:   Future    Standing Expiration Date:   03/21/2023   The patient has a good understanding of the overall plan. he agrees with it. he will call with any problems that may develop before the next visit here. Total time spent: 30 mins including face to face time and time spent for planning, charting and co-ordination of  care   [name], MD 03/20/22    I Gardiner Coins am scribing for Dr. Lindi Adie  I have reviewed the above documentation for accuracy and completeness, and I agree with the above.

## 2022-03-06 NOTE — Progress Notes (Signed)
Physical Therapy Treatment Patient Details Name: Joshua Hudson MRN: [id number] DOB: [birth year] Today's Date: 03/06/2022   History of Present Illness 87 yo male admitted with recurrent GI bleed, anemia. s/p polypectomy, hemostasis clip, colonoscopy, enteroscopy 6/30. Hx of CHF, Afib, CKD, RA, AICD    PT Comments    Pt continues to participate well. He reports walking with family x 2 on today. Hr up to 127 bpm, O2 95% on RA. Pt stated he is scheduled for a cardiac procedure at [facility] on Wed.    Recommendations for follow up therapy are one component of a multi-disciplinary discharge planning process, led by the attending physician.  Recommendations may be updated based on patient status, additional functional criteria and insurance authorization.  Follow Up Recommendations  No PT follow up     Assistance Recommended at Discharge PRN  Patient can return home with the following Assistance with cooking/housework;Assist for transportation;Help with stairs or ramp for entrance   Equipment Recommendations  None recommended by PT    Recommendations for Other Services       Precautions / Restrictions Precautions Precautions: Fall Precaution Comments: monitor HR Restrictions Weight Bearing Restrictions: No     Mobility  Bed Mobility               General bed mobility comments: in recliner    Transfers Overall transfer level: Needs assistance Equipment used: None Transfers: Sit to/from Stand Sit to Stand: Supervision                Ambulation/Gait Ambulation/Gait assistance: Min guard Gait Distance (Feet): 150 Feet Assistive device: None Gait Pattern/deviations: Step-through pattern, Decreased stride length       General Gait Details: Moves quickly. HR up to 127 bpm, O2 95% on RA, dyspnea 2/4. Min guard for [employer]    Modified Rankin (Stroke Patients Only)       Balance Overall balance assessment: Mild  deficits observed, not formally tested                                          Cognition Arousal/Alertness: Awake/alert Behavior During Therapy: WFL for tasks assessed/performed Overall Cognitive Status: Within Functional Limits for tasks assessed                                          Exercises      General Comments        Pertinent Vitals/Pain Pain Assessment Pain Assessment: No/denies pain    Home Living                          Prior Function            PT Goals (current goals can now be found in the care plan section) Progress towards PT goals: Progressing toward goals    Frequency    Min 3X/week      PT Plan Current plan remains appropriate    Co-evaluation              AM-PAC PT "6 Clicks" Mobility   Outcome Measure  Help needed turning from your back to your side while in a flat bed without  using bedrails?: None Help needed moving from lying on your back to sitting on the side of a flat bed without using bedrails?: None Help needed moving to and from a bed to a chair (including a wheelchair)?: None Help needed standing up from a chair using your arms (e.g., wheelchair or bedside chair)?: None Help needed to walk in hospital room?: A Little Help needed climbing 3-5 steps with a railing? : A Little 6 Click Score: 22    End of Session   Activity Tolerance: Patient tolerated treatment well Patient left: in chair;with call bell/phone within reach   PT Visit Diagnosis: Unsteadiness on feet (R26.81)     Time: 5320-2334 PT Time Calculation (min) (ACUTE ONLY): 8 min  Charges:  $Gait Training: 8-22 mins                         Doreatha Massed, PT Acute Rehabilitation  Office: [phone] Pager: [phone]

## 2022-03-06 NOTE — Progress Notes (Signed)
Progress Note  Patient Name: Joshua Hudson Date of Encounter: 03/06/2022  [facility] Cardiologist: [name], MD   Subjective   Unaware of palpitations, breathing OK. Remains in AFib, rate controlled at rest (80-90s), increases to 120s with exertion. Has bilateral leg edema halfway to knees. +5L since admission. No weights.  Inpatient Medications    Scheduled Meds:  sodium chloride   Intravenous Once   amiodarone  400 mg Oral BID   apixaban  2.5 mg Oral BID   folic acid  1 mg Oral Daily   furosemide  40 mg Oral Daily   levothyroxine  88 mcg Oral q morning   pantoprazole  40 mg Oral BID   predniSONE  5 mg Oral Q breakfast   saccharomyces boulardii  250 mg Oral BID   tamsulosin  0.4 mg Oral Daily   Continuous Infusions:  sodium chloride 20 mL/hr at 03/04/22 0935   PRN Meds: acetaminophen **OR** acetaminophen, ketotifen, mouth rinse, traZODone   Vital Signs    Vitals:   03/06/22 0659 03/06/22 0816 03/06/22 1044 03/06/22 1255  BP: 106/76 (!) 81/53 115/75 (!) 97/57  Pulse: 93 65 99 74  Resp:  '18 20 16  '$ Temp:  98.6 F (37 C)  98.4 F (36.9 C)  TempSrc:  Oral  Oral  SpO2:  100% 100% 99%  Weight:      Height:        Intake/Output Summary (Last 24 hours) at 03/06/2022 1406 Last data filed at 03/06/2022 1138 Gross per 24 hour  Intake 840 ml  Output --  Net 840 ml      03/01/2022    2:32 PM 02/25/2022    4:29 AM 02/24/2022    5:00 AM  Last 3 Weights  Weight (lbs) 107 lb 9.4 oz 107 lb 9.4 oz 108 lb 4.8 oz  Weight (kg) 48.8 kg 48.8 kg 49.125 kg      Telemetry    AFib with rate controlled at rest, RVR with minimal activity - Personally Reviewed  ECG    Atypical atrial flutter with RVR - Personally Reviewed  Physical Exam  Comfortable at rest. GEN: No acute distress.   Neck: JVP to angle of jaw Cardiac: RRR, no murmurs, rubs, or gallops.  Respiratory: Clear to auscultation bilaterally. GI: Soft, nontender, non-distended  MS: 2-3+ ankle edema, soft  pitting and symetrical edema; No deformity. Neuro:  Nonfocal  Psych: Normal affect   Labs    High Sensitivity Troponin:   Recent Labs  Lab 02/22/22 1244 02/22/22 1444 02/28/22 2109 02/28/22 2239  TROPONINIHS 77* 69* 49* 48*     Chemistry Recent Labs  Lab 02/28/22 1650 02/28/22 1724 02/28/22 2109 03/01/22 0630 03/02/22 0519 03/02/22 1513 03/04/22 0538 03/05/22 0815 03/06/22 0751  NA 139   < >  --  142   < > 138 139 143 143  K 3.8   < >  --  3.9   < > 4.1 3.8 3.0* 3.6  CL 106   < >  --  109   < > 107 110 111 111  CO2 24  --   --  24   < > '23 24 25 22  '$ GLUCOSE 100*   < >  --  88   < > 126* 81 84 100*  BUN 33*   < >  --  34*   < > 32* 20 26* 32*  CREATININE 2.34*   < >  --  2.13*   < >  1.82* 1.35* 1.60* 1.72*  CALCIUM 8.2*  --   --  8.2*   < > 8.3* 8.2* 8.4* 8.5*  MG  --   --  1.9 2.1  --  2.1  --   --   --   PROT 5.9*  --   --  5.5*  --   --   --   --   --   ALBUMIN 3.6  --   --  3.4*  --   --   --   --   --   AST 31  --   --  25  --   --   --   --   --   ALT 19  --   --  17  --   --   --   --   --   ALKPHOS 47  --   --  42  --   --   --   --   --   BILITOT 0.7  --   --  1.2  --   --   --   --   --   GFRNONAA 27*  --   --  30*   < > 36* 51* 42* 38*  ANIONGAP 9  --   --  9   < > '8 5 7 10   '$ < > = values in this interval not displayed.    Lipids No results for input(s): "CHOL", "TRIG", "HDL", "LABVLDL", "LDLCALC", "CHOLHDL" in the last 168 hours.  Hematology Recent Labs  Lab 03/04/22 1256 03/05/22 0815 03/06/22 0751  WBC 10.7* 7.5 8.0  RBC 3.71* 3.69* 3.73*  HGB 9.1* 9.0* 9.1*  HCT 28.8* 28.3* 29.4*  MCV 77.6* 76.7* 78.8*  MCH 24.5* 24.4* 24.4*  MCHC 31.6 31.8 31.0  RDW 21.5* 21.9* 22.6*  PLT 117* 113* 114*   Thyroid No results for input(s): "TSH", "FREET4" in the last 168 hours.  BNP Recent Labs  Lab 02/28/22 1650  BNP 256.0*    DDimer No results for input(s): "DDIMER" in the last 168 hours.   Radiology    No results found.  Cardiac Studies    Echo 02/24/2022   1. Left ventricular ejection fraction, by estimation, is 55 to 60%. The  left ventricle has normal function. The left ventricle has no regional  wall motion abnormalities. Left ventricular diastolic function could not  be evaluated.   2. Right ventricular systolic function is moderately reduced. The right  ventricular size is moderately enlarged.   3. Left atrial size was severely dilated.   4. Right atrial size was mildly dilated.   5. The mitral valve is normal in structure. Mild mitral valve  regurgitation.   6. Tricuspid valve regurgitation is moderate.   7. The aortic valve has been repaired/replaced. Aortic valve  regurgitation is not visualized. No aortic stenosis is present.   Patient Profile     87 y.o. male with a history of atrial flutter with prior ablation (2021), TAVR (2020), rheumatoid arthritis, chronic anemia (hemoglobin 8) admitted with GI bleed and seen for atypical atrial flutter/atrial fibrillation with rapid ventricular response and secondary acute decompensation of diastolic HF  Assessment & Plan    Markedly hypervolemic. Increase diuretics. TEE cardioversion scheduled first available 03/08/2022 at 1500h. This procedure has been fully reviewed with the patient and written informed consent has been obtained.      For questions or updates, please contact [place] Please consult [email or web address] for contact info under  Signed, Sanda Klein, MD  03/06/2022, 2:06 PM

## 2022-03-06 NOTE — Anesthesia Postprocedure Evaluation (Signed)
Anesthesia Post Note  Patient: [name]  Procedure(s) Performed: ENTEROSCOPY COLONOSCOPY HOT HEMOSTASIS (ARGON PLASMA COAGULATION/BICAP) HEMOSTASIS CLIP PLACEMENT POLYPECTOMY     Patient location during evaluation: PACU Anesthesia Type: MAC Level of consciousness: awake and alert Pain management: pain level controlled Vital Signs Assessment: post-procedure vital signs reviewed and stable Respiratory status: spontaneous breathing, nonlabored ventilation, respiratory function stable and patient connected to nasal cannula oxygen Cardiovascular status: stable and blood pressure returned to baseline Postop Assessment: no apparent nausea or vomiting Anesthetic complications: no   No notable events documented.  Last Vitals:  Vitals:   03/06/22 1044 03/06/22 1255  BP: 115/75 (!) 97/57  Pulse: 99 74  Resp: 20 16  Temp:  36.9 C  SpO2: 100% 99%    Last Pain:  Vitals:   03/06/22 1255  TempSrc: Oral  PainSc:    Pain Goal:                   Defne Gerling

## 2022-03-06 NOTE — TOC Progression Note (Signed)
Transition of Care [facility]) - Progression Note    Patient Details  Name: Joshua Hudson MRN: [id number] Date of Birth: [birth year]  [facility]) CM/SW Contact  Joaquin Courts, RN Phone Number: 03/06/2022, 9:51 AM  Clinical Narrative:    CM reviewed chart, noted patient from home, remains on room air and has pcp.  Noted no follow-up recommendations from PT.  No TOC needs noted at this time, patient plans to discharge home when medically stable.   TOC signing off at this time, please consult if new needs arise.   Expected Discharge Plan: Home/Self Care Barriers to Discharge: Continued Medical Work up  Expected Discharge Plan and Services Expected Discharge Plan: Home/Self Care   Discharge Planning Services: CM Consult   Living arrangements for the past 2 months: Single Family Home                                       Social Determinants of Health (SDOH) Interventions    Readmission Risk Interventions    03/01/2022    1:46 PM  Readmission Risk Prevention Plan  Transportation Screening Complete  PCP or Specialist Appt within 3-5 Days Complete  HRI or [place] Complete  Social Work Consult for [place] Planning/Counseling Complete  Palliative Care Screening Not Applicable  Medication Review [employer]) Complete

## 2022-03-06 NOTE — Plan of Care (Signed)

## 2022-03-07 DIAGNOSIS — K922 Gastrointestinal hemorrhage, unspecified: Secondary | ICD-10-CM | POA: Diagnosis not present

## 2022-03-07 DIAGNOSIS — I5033 Acute on chronic diastolic (congestive) heart failure: Secondary | ICD-10-CM | POA: Diagnosis not present

## 2022-03-07 DIAGNOSIS — I484 Atypical atrial flutter: Secondary | ICD-10-CM | POA: Diagnosis not present

## 2022-03-07 LAB — BASIC METABOLIC PANEL
Anion gap: 7 (ref 5–15)
BUN: 30 mg/dL — ABNORMAL HIGH (ref 8–23)
CO2: 27 mmol/L (ref 22–32)
Calcium: 8.6 mg/dL — ABNORMAL LOW (ref 8.9–10.3)
Chloride: 109 mmol/L (ref 98–111)
Creatinine, Ser: 1.56 mg/dL — ABNORMAL HIGH (ref 0.61–1.24)
GFR, Estimated: 43 mL/min — ABNORMAL LOW (ref >60)
Glucose, Bld: 77 mg/dL (ref 70–99)
Potassium: 3.5 mmol/L (ref 3.5–5.1)
Sodium: 143 mmol/L (ref 135–145)

## 2022-03-07 LAB — CBC
HCT: 27.2 % — ABNORMAL LOW (ref 39.0–52.0)
Hemoglobin: 8.7 g/dL — ABNORMAL LOW (ref 13.0–17.0)
MCH: 24.6 pg — ABNORMAL LOW (ref 26.0–34.0)
MCHC: 32 g/dL (ref 30.0–36.0)
MCV: 76.8 fL — ABNORMAL LOW (ref 80.0–100.0)
Platelets: 113 10*3/uL — ABNORMAL LOW (ref 150–400)
RBC: 3.54 MIL/uL — ABNORMAL LOW (ref 4.22–5.81)
RDW: 22.2 % — ABNORMAL HIGH (ref 11.5–15.5)
WBC: 6.9 10*3/uL (ref 4.0–10.5)
nRBC: 0.3 % — ABNORMAL HIGH (ref 0.0–0.2)

## 2022-03-07 MED ORDER — SODIUM CHLORIDE 0.9 % IV SOLN: INTRAVENOUS | Status: DC

## 2022-03-07 NOTE — Plan of Care (Signed)

## 2022-03-07 NOTE — Progress Notes (Signed)
Progress Note  Patient Name: Joshua Hudson Date of Encounter: 03/07/2022  [facility] Cardiologist: [name], MD   Subjective   Has a hard time falling asleep, but denies dyspnea or palpitations. Ventricular rate in the 70s when asleep, promptly increases to the 120s with stimulation or minimal activity. Denies orthopnea. Urine output was not recorded. Weight is slightly lower than recorded on admission.  Inpatient Medications    Scheduled Meds:  sodium chloride   Intravenous Once   amiodarone  400 mg Oral BID   apixaban  2.5 mg Oral BID   folic acid  1 mg Oral Daily   furosemide  80 mg Oral Daily   levothyroxine  88 mcg Oral q morning   pantoprazole  40 mg Oral BID   potassium chloride  20 mEq Oral Daily   predniSONE  5 mg Oral Q breakfast   saccharomyces boulardii  250 mg Oral BID   tamsulosin  0.4 mg Oral Daily   Continuous Infusions:  sodium chloride 20 mL/hr at 03/04/22 0935   PRN Meds: acetaminophen **OR** acetaminophen, ketotifen, mouth rinse, traZODone   Vital Signs    Vitals:   03/06/22 2127 03/06/22 2149 03/07/22 0427 03/07/22 0814  BP: 97/77 102/70 108/78 101/71  Pulse: 84  61 85  Resp: '17  18 18  '$ Temp: 98.6 F (37 C)  98.9 F (37.2 C) 98 F (36.7 C)  TempSrc: Oral  Oral Oral  SpO2:   99% 100%  Weight:   48 kg   Height:        Intake/Output Summary (Last 24 hours) at 03/07/2022 0841 Last data filed at 03/07/2022 0400 Gross per 24 hour  Intake 1130.37 ml  Output --  Net 1130.37 ml      03/07/2022    4:27 AM 03/01/2022    2:32 PM 02/25/2022    4:29 AM  Last 3 Weights  Weight (lbs) 105 lb 14.2 oz 107 lb 9.4 oz 107 lb 9.4 oz  Weight (kg) 48.03 kg 48.8 kg 48.8 kg      Telemetry    Atrial fibrillation with controlled ventricular rate at rest, prompt RVR with minimal activity- Personally Reviewed  ECG    No new tracings- Personally Reviewed  Physical Exam  Thin and frail appearing GEN: No acute distress.   Neck: No JVD Cardiac:  Irregular, no murmurs, rubs, or gallops.  Respiratory: Clear to auscultation bilaterally. GI: Soft, nontender, non-distended  MS: 2+ symmetrical ankle soft pitting edema; No deformity. Neuro:  Nonfocal  Psych: Normal affect   Labs    High Sensitivity Troponin:   Recent Labs  Lab 02/22/22 1244 02/22/22 1444 02/28/22 2109 02/28/22 2239  TROPONINIHS 77* 69* 49* 48*     Chemistry Recent Labs  Lab 02/28/22 1650 02/28/22 1724 02/28/22 2109 03/01/22 0630 03/02/22 0519 03/02/22 1513 03/04/22 0538 03/05/22 0815 03/06/22 0751  NA 139   < >  --  142   < > 138 139 143 143  K 3.8   < >  --  3.9   < > 4.1 3.8 3.0* 3.6  CL 106   < >  --  109   < > 107 110 111 111  CO2 24  --   --  24   < > '23 24 25 22  '$ GLUCOSE 100*   < >  --  88   < > 126* 81 84 100*  BUN 33*   < >  --  34*   < >  32* 20 26* 32*  CREATININE 2.34*   < >  --  2.13*   < > 1.82* 1.35* 1.60* 1.72*  CALCIUM 8.2*  --   --  8.2*   < > 8.3* 8.2* 8.4* 8.5*  MG  --   --  1.9 2.1  --  2.1  --   --   --   PROT 5.9*  --   --  5.5*  --   --   --   --   --   ALBUMIN 3.6  --   --  3.4*  --   --   --   --   --   AST 31  --   --  25  --   --   --   --   --   ALT 19  --   --  17  --   --   --   --   --   ALKPHOS 47  --   --  42  --   --   --   --   --   BILITOT 0.7  --   --  1.2  --   --   --   --   --   GFRNONAA 27*  --   --  30*   < > 36* 51* 42* 38*  ANIONGAP 9  --   --  9   < > '8 5 7 10   '$ < > = values in this interval not displayed.    Lipids No results for input(s): "CHOL", "TRIG", "HDL", "LABVLDL", "LDLCALC", "CHOLHDL" in the last 168 hours.  Hematology Recent Labs  Lab 03/05/22 0815 03/06/22 0751 03/07/22 0747  WBC 7.5 8.0 6.9  RBC 3.69* 3.73* 3.54*  HGB 9.0* 9.1* 8.7*  HCT 28.3* 29.4* 27.2*  MCV 76.7* 78.8* 76.8*  MCH 24.4* 24.4* 24.6*  MCHC 31.8 31.0 32.0  RDW 21.9* 22.6* 22.2*  PLT 113* 114* 113*   Thyroid No results for input(s): "TSH", "FREET4" in the last 168 hours.  BNP Recent Labs  Lab 02/28/22 1650   BNP 256.0*    DDimer No results for input(s): "DDIMER" in the last 168 hours.   Radiology    No results found.  Cardiac Studies   Echo 02/24/2022    1. Left ventricular ejection fraction, by estimation, is 55 to 60%. The  left ventricle has normal function. The left ventricle has no regional  wall motion abnormalities. Left ventricular diastolic function could not  be evaluated.   2. Right ventricular systolic function is moderately reduced. The right  ventricular size is moderately enlarged.   3. Left atrial size was severely dilated.   4. Right atrial size was mildly dilated.   5. The mitral valve is normal in structure. Mild mitral valve  regurgitation.   6. Tricuspid valve regurgitation is moderate.   7. The aortic valve has been repaired/replaced. Aortic valve  regurgitation is not visualized. No aortic stenosis is present.   Patient Profile     87 y.o. male with a history of atrial flutter with prior ablation (2021), TAVR (2020), rheumatoid arthritis, chronic anemia (hemoglobin 8) admitted with GI bleed and seen for atypical atrial flutter/atrial fibrillation with rapid ventricular response and secondary acute decompensation of diastolic HF  Assessment & Plan    Still has signs of hypervolemia. Labs pending today. Needs strict I/O records. TEE and cardioversion with sedation scheduled tomorrow at 1500 hrs. This procedure has been fully reviewed with  the patient and written informed consent has been obtained.      For questions or updates, please contact [place] Please consult [email or web address] for contact info under        Signed, Sanda Klein, MD  03/07/2022, 8:41 AM

## 2022-03-07 NOTE — Progress Notes (Signed)
Notified on call provider due to patient's heart rate hanging around the 130's. Patient's heart rate did drop back down to the 60's when nurse went to check patient's vitals.

## 2022-03-07 NOTE — H&P (View-Only) (Signed)
Progress Note  Patient Name: Joshua Hudson Date of Encounter: 03/07/2022  [facility] Cardiologist: [name], MD   Subjective   Has a hard time falling asleep, but denies dyspnea or palpitations. Ventricular rate in the 70s when asleep, promptly increases to the 120s with stimulation or minimal activity. Denies orthopnea. Urine output was not recorded. Weight is slightly lower than recorded on admission.  Inpatient Medications    Scheduled Meds:  sodium chloride   Intravenous Once   amiodarone  400 mg Oral BID   apixaban  2.5 mg Oral BID   folic acid  1 mg Oral Daily   furosemide  80 mg Oral Daily   levothyroxine  88 mcg Oral q morning   pantoprazole  40 mg Oral BID   potassium chloride  20 mEq Oral Daily   predniSONE  5 mg Oral Q breakfast   saccharomyces boulardii  250 mg Oral BID   tamsulosin  0.4 mg Oral Daily   Continuous Infusions:  sodium chloride 20 mL/hr at 03/04/22 0935   PRN Meds: acetaminophen **OR** acetaminophen, ketotifen, mouth rinse, traZODone   Vital Signs    Vitals:   03/06/22 2127 03/06/22 2149 03/07/22 0427 03/07/22 0814  BP: 97/77 102/70 108/78 101/71  Pulse: 84  61 85  Resp: '17  18 18  '$ Temp: 98.6 F (37 C)  98.9 F (37.2 C) 98 F (36.7 C)  TempSrc: Oral  Oral Oral  SpO2:   99% 100%  Weight:   48 kg   Height:        Intake/Output Summary (Last 24 hours) at 03/07/2022 0841 Last data filed at 03/07/2022 0400 Gross per 24 hour  Intake 1130.37 ml  Output --  Net 1130.37 ml      03/07/2022    4:27 AM 03/01/2022    2:32 PM 02/25/2022    4:29 AM  Last 3 Weights  Weight (lbs) 105 lb 14.2 oz 107 lb 9.4 oz 107 lb 9.4 oz  Weight (kg) 48.03 kg 48.8 kg 48.8 kg      Telemetry    Atrial fibrillation with controlled ventricular rate at rest, prompt RVR with minimal activity- Personally Reviewed  ECG    No new tracings- Personally Reviewed  Physical Exam  Thin and frail appearing GEN: No acute distress.   Neck: No JVD Cardiac:  Irregular, no murmurs, rubs, or gallops.  Respiratory: Clear to auscultation bilaterally. GI: Soft, nontender, non-distended  MS: 2+ symmetrical ankle soft pitting edema; No deformity. Neuro:  Nonfocal  Psych: Normal affect   Labs    High Sensitivity Troponin:   Recent Labs  Lab 02/22/22 1244 02/22/22 1444 02/28/22 2109 02/28/22 2239  TROPONINIHS 77* 69* 49* 48*     Chemistry Recent Labs  Lab 02/28/22 1650 02/28/22 1724 02/28/22 2109 03/01/22 0630 03/02/22 0519 03/02/22 1513 03/04/22 0538 03/05/22 0815 03/06/22 0751  NA 139   < >  --  142   < > 138 139 143 143  K 3.8   < >  --  3.9   < > 4.1 3.8 3.0* 3.6  CL 106   < >  --  109   < > 107 110 111 111  CO2 24  --   --  24   < > '23 24 25 22  '$ GLUCOSE 100*   < >  --  88   < > 126* 81 84 100*  BUN 33*   < >  --  34*   < >  32* 20 26* 32*  CREATININE 2.34*   < >  --  2.13*   < > 1.82* 1.35* 1.60* 1.72*  CALCIUM 8.2*  --   --  8.2*   < > 8.3* 8.2* 8.4* 8.5*  MG  --   --  1.9 2.1  --  2.1  --   --   --   PROT 5.9*  --   --  5.5*  --   --   --   --   --   ALBUMIN 3.6  --   --  3.4*  --   --   --   --   --   AST 31  --   --  25  --   --   --   --   --   ALT 19  --   --  17  --   --   --   --   --   ALKPHOS 47  --   --  42  --   --   --   --   --   BILITOT 0.7  --   --  1.2  --   --   --   --   --   GFRNONAA 27*  --   --  30*   < > 36* 51* 42* 38*  ANIONGAP 9  --   --  9   < > '8 5 7 10   '$ < > = values in this interval not displayed.    Lipids No results for input(s): "CHOL", "TRIG", "HDL", "LABVLDL", "LDLCALC", "CHOLHDL" in the last 168 hours.  Hematology Recent Labs  Lab 03/05/22 0815 03/06/22 0751 03/07/22 0747  WBC 7.5 8.0 6.9  RBC 3.69* 3.73* 3.54*  HGB 9.0* 9.1* 8.7*  HCT 28.3* 29.4* 27.2*  MCV 76.7* 78.8* 76.8*  MCH 24.4* 24.4* 24.6*  MCHC 31.8 31.0 32.0  RDW 21.9* 22.6* 22.2*  PLT 113* 114* 113*   Thyroid No results for input(s): "TSH", "FREET4" in the last 168 hours.  BNP Recent Labs  Lab 02/28/22 1650   BNP 256.0*    DDimer No results for input(s): "DDIMER" in the last 168 hours.   Radiology    No results found.  Cardiac Studies   Echo 02/24/2022    1. Left ventricular ejection fraction, by estimation, is 55 to 60%. The  left ventricle has normal function. The left ventricle has no regional  wall motion abnormalities. Left ventricular diastolic function could not  be evaluated.   2. Right ventricular systolic function is moderately reduced. The right  ventricular size is moderately enlarged.   3. Left atrial size was severely dilated.   4. Right atrial size was mildly dilated.   5. The mitral valve is normal in structure. Mild mitral valve  regurgitation.   6. Tricuspid valve regurgitation is moderate.   7. The aortic valve has been repaired/replaced. Aortic valve  regurgitation is not visualized. No aortic stenosis is present.   Patient Profile     87 y.o. male with a history of atrial flutter with prior ablation (2021), TAVR (2020), rheumatoid arthritis, chronic anemia (hemoglobin 8) admitted with GI bleed and seen for atypical atrial flutter/atrial fibrillation with rapid ventricular response and secondary acute decompensation of diastolic HF  Assessment & Plan    Still has signs of hypervolemia. Labs pending today. Needs strict I/O records. TEE and cardioversion with sedation scheduled tomorrow at 1500 hrs. This procedure has been fully reviewed with  the patient and written informed consent has been obtained.      For questions or updates, please contact [place] Please consult [email or web address] for contact info under        Signed, Sanda Klein, MD  03/07/2022, 8:41 AM

## 2022-03-07 NOTE — Progress Notes (Signed)
PROGRESS NOTE    Joshua Hudson  NLZ:[id number] DOB: February 22, 1935 DOA: 02/28/2022 PCP: Lavone Orn, MD   Brief Narrative: 87 year old with past medical history significant for hypertension, chronic combined CHF, paroxysmal A-fib on Eliquis, CKD 3B, rheumatoid arthritis, bioprosthetic AV, AICD, GERD, hypothyroidism who presents complaining of rectal bleeding, melena. Recent admission from 6/21 to 6/24 due to GI bleed, endoscopy done in 6/23 showed abnormal esophageal motility, erosive gastropathy with no stigmata of recent bleeding, GI recommended PPI for 1 month.  His Eliquis was on hold for 1 week.  He has not restarted Eliquis yet.  He received IV iron prior to discharge. T abdomen and pelvis without contrast: Showed suspect proctitis.   GI has been consulted and underwent  capsule endoscopy 6/28. Capsule appears to be in the stomach. Underwent  enteroscopy and colonoscopy 6/30: Push enteroscopy showed few AVMs in the Duodenum with stigmata of recent bleeding.  Treated with APC and clip placement.  Also has scattered AVMs in the proximal jejunum treated with APC.  Colonoscopy showed normal TI, few a small AVM, nonbleeding in the cecum and ascending colon treated with APC.  Small ascending colon polyp removed.  Cardiology was consulted for A-fib with RVR.  He was started on IV amiodarone.  Subsequently transition to oral amiodarone.  He was able to be started back on Eliquis.  Cardiology now planning TEE cardioversion on 7/5   Assessment & Plan:   Principal Problem:   GI bleed Active Problems:   GERD (gastroesophageal reflux disease)   Atrial flutter (HCC)   Elevated troponin   Microcytic anemia   Hypothyroidism  1-Recurrent GI bleed: Melena -Continue with IV Protonix. -He received one unit PRBC 6/28. Hb increased to 9. Has remain stable. -Underwent Capsule endoscopy 6/28. Capsule appears to be in the stomach per GI notes.  -Push enteroscopy showed few AVMs in the Duodenum with  stigmata of recent bleeding.  Treated with APC and clip placement.  Also has scattered AVMs in the proximal jejunum treated with APC.  Colonoscopy showed normal TI, few a small AVM, nonbleeding in the cecum and ascending colon treated with APC.  Small ascending colon polyp removed. -Per GI ok to resume low dose anticoagulation 7/01  (if hb remain stable). -Started  soft diet.  Eliquis low dose resume on 7/01. No further bleeding. Monitor hb.   2-Microcytic anemia: Iron deficiency anemia, acute blood loss in the setting of GI bleed: Received one unit PRBC>  Denies melena.  He received IV Iron 6/24. Repeated IV iron 7/3. Monitor hb.,   3-Chronic A fib: A fib RVR On oral amiodarone at home  Transition to oral amiodarone 7/01. Appreciate cardiology assistance.  Patient with SOB on exertion, cardiology recommend TEE cardioversion, planned for 7/05.  Elevated troponin, the setting of demand ischemia: Denies chest pain. Keep hemoglobin above 8.  Prolonged QT: Avoid prolonged QT medication. Replete electrolytes as needed. Mg 2.1  Chronic diastolic heart failure, history of aortic valve replacement: He received IV Lasix with blood transfusion 6/29 Cardiology increasing lasix dose.   GERD; PPI  Hypothyroidism; Continue with synthroid.   RA; on chronic low dose prednisone resume. Hold Avara due to thrombocytopenia,  BPH; Resume flomax. Hold finasteride to avoid further hypotension.   AKI on CKD stage IIIb;  Prior Cr 1.7---1.8 Cr down to 1.6.  Creatinine peak to 2.8  Diarrhea; monitor. Started on [employer].  Improving.   Estimated body mass index is 18.76 kg/m as calculated from the following:   Height as  of this encounter: '5\' 3"'$  (1.6 m).   Weight as of this encounter: 48 kg.   DVT prophylaxis: SCD Code Status: DNR Family Communication: family at bedside 7/03 Disposition Plan:  Status is: Inpatient Remains inpatient appropriate because: management of GI bleed.      Consultants:  GI  Procedures:  Capsule endoscopy   Antimicrobials:    Subjective: He is feeling ok, report dyspnea on exertion.  Diarrhea improved.    Objective: Vitals:   03/06/22 1255 03/06/22 2127 03/06/22 2149 03/07/22 0427  BP: (!) 97/57 97/77 102/70 108/78  Pulse: 74 84  61  Resp: '16 17  18  '$ Temp: 98.4 F (36.9 C) 98.6 F (37 C)  98.9 F (37.2 C)  TempSrc: Oral Oral  Oral  SpO2: 99%   99%  Weight:    48 kg  Height:        Intake/Output Summary (Last 24 hours) at 03/07/2022 0713 Last data filed at 03/06/2022 1810 Gross per 24 hour  Intake 770.37 ml  Output --  Net 770.37 ml    Filed Weights   03/01/22 1432 03/07/22 0427  Weight: 48.8 kg 48 kg    Examination:  General exam: NAD Respiratory system: CTA Cardiovascular system: S 1, S 2 RRR Gastrointestinal system: BS present, soft, nt Central nervous system: Alert, follows command Extremities: Plus 2 edema   Data Reviewed: I have personally reviewed following labs and imaging studies  CBC: Recent Labs  Lab 03/02/22 2227 03/04/22 0538 03/04/22 1256 03/05/22 0815 03/06/22 0751  WBC 8.4 9.9 10.7* 7.5 8.0  HGB 9.2* 8.8* 9.1* 9.0* 9.1*  HCT 29.7* 27.4* 28.8* 28.3* 29.4*  MCV 78.2* 76.8* 77.6* 76.7* 78.8*  PLT 102* 107* 117* 113* 114*    Basic Metabolic Panel: Recent Labs  Lab 02/28/22 2109 03/01/22 0630 03/02/22 0519 03/02/22 1513 03/04/22 0538 03/05/22 0815 03/06/22 0751  NA  --  142 141 138 139 143 143  K  --  3.9 4.4 4.1 3.8 3.0* 3.6  CL  --  109 110 107 110 111 111  CO2  --  '24 22 23 24 25 22  '$ GLUCOSE  --  88 81 126* 81 84 100*  BUN  --  34* 27* 32* 20 26* 32*  CREATININE  --  2.13* 1.69* 1.82* 1.35* 1.60* 1.72*  CALCIUM  --  8.2* 8.3* 8.3* 8.2* 8.4* 8.5*  MG 1.9 2.1  --  2.1  --   --   --   PHOS  --  3.5  --   --   --   --   --     GFR: Estimated Creatinine Clearance: 21.3 mL/min (A) (by C-G formula based on SCr of 1.72 mg/dL (H)). Liver Function Tests: Recent Labs   Lab 02/28/22 1650 03/01/22 0630  AST 31 25  ALT 19 17  ALKPHOS 47 42  BILITOT 0.7 1.2  PROT 5.9* 5.5*  ALBUMIN 3.6 3.4*    No results for input(s): "LIPASE", "AMYLASE" in the last 168 hours. No results for input(s): "AMMONIA" in the last 168 hours. Coagulation Profile: Recent Labs  Lab 02/28/22 2239  INR 1.3*    Cardiac Enzymes: No results for input(s): "CKTOTAL", "CKMB", "CKMBINDEX", "TROPONINI" in the last 168 hours. BNP (last 3 results) No results for input(s): "PROBNP" in the last 8760 hours. HbA1C: No results for input(s): "HGBA1C" in the last 72 hours. CBG: No results for input(s): "GLUCAP" in the last 168 hours. Lipid Profile: No results for input(s): "CHOL", "HDL", "LDLCALC", "  TRIG", "CHOLHDL", "LDLDIRECT" in the last 72 hours. Thyroid Function Tests: No results for input(s): "TSH", "T4TOTAL", "FREET4", "T3FREE", "THYROIDAB" in the last 72 hours. Anemia Panel: No results for input(s): "VITAMINB12", "FOLATE", "FERRITIN", "TIBC", "IRON", "RETICCTPCT" in the last 72 hours. Sepsis Labs: Recent Labs  Lab 02/28/22 2109 02/28/22 2239  LATICACIDVEN 1.3 0.9     No results found for this or any previous visit (from the past 240 hour(s)).       Radiology Studies: No results found.      Scheduled Meds:  sodium chloride   Intravenous Once   amiodarone  400 mg Oral BID   apixaban  2.5 mg Oral BID   folic acid  1 mg Oral Daily   furosemide  80 mg Oral Daily   levothyroxine  88 mcg Oral q morning   pantoprazole  40 mg Oral BID   potassium chloride  20 mEq Oral Daily   predniSONE  5 mg Oral Q breakfast   saccharomyces boulardii  250 mg Oral BID   tamsulosin  0.4 mg Oral Daily   Continuous Infusions:  sodium chloride 20 mL/hr at 03/04/22 0935     LOS: 7 days    Time spent: 38 minutes    Ayven Pheasant A Kayla Weekes, MD [facility]   If 7PM-7AM, please contact night-coverage [email or web address]  03/07/2022, 7:13 AM

## 2022-03-08 ENCOUNTER — Inpatient Hospital Stay (HOSPITAL_COMMUNITY): Payer: Medicare PPO

## 2022-03-08 ENCOUNTER — Encounter (HOSPITAL_COMMUNITY)
Admission: EM | Disposition: A | Discharge status: Home / Self Care | Payer: Self-pay | Source: Home / Self Care | Attending: Internal Medicine

## 2022-03-08 ENCOUNTER — Inpatient Hospital Stay (HOSPITAL_COMMUNITY): Payer: Medicare PPO | Admitting: Anesthesiology

## 2022-03-08 ENCOUNTER — Encounter (HOSPITAL_COMMUNITY): Payer: Self-pay | Admitting: Internal Medicine

## 2022-03-08 DIAGNOSIS — I700 Atherosclerosis of aorta: Secondary | ICD-10-CM | POA: Diagnosis not present

## 2022-03-08 DIAGNOSIS — I081 Rheumatic disorders of both mitral and tricuspid valves: Secondary | ICD-10-CM

## 2022-03-08 DIAGNOSIS — K922 Gastrointestinal hemorrhage, unspecified: Secondary | ICD-10-CM | POA: Diagnosis not present

## 2022-03-08 DIAGNOSIS — I491 Atrial premature depolarization: Secondary | ICD-10-CM

## 2022-03-08 DIAGNOSIS — I4891 Unspecified atrial fibrillation: Secondary | ICD-10-CM

## 2022-03-08 DIAGNOSIS — I484 Atypical atrial flutter: Secondary | ICD-10-CM | POA: Diagnosis not present

## 2022-03-08 DIAGNOSIS — I340 Nonrheumatic mitral (valve) insufficiency: Secondary | ICD-10-CM

## 2022-03-08 HISTORY — PX: CARDIOVERSION: SHX1299

## 2022-03-08 HISTORY — PX: TEE WITHOUT CARDIOVERSION: SHX5443

## 2022-03-08 LAB — CBC
HCT: 28 % — ABNORMAL LOW (ref 39.0–52.0)
Hemoglobin: 8.5 g/dL — ABNORMAL LOW (ref 13.0–17.0)
MCH: 24 pg — ABNORMAL LOW (ref 26.0–34.0)
MCHC: 30.4 g/dL (ref 30.0–36.0)
MCV: 79.1 fL — ABNORMAL LOW (ref 80.0–100.0)
Platelets: 148 10*3/uL — ABNORMAL LOW (ref 150–400)
RBC: 3.54 MIL/uL — ABNORMAL LOW (ref 4.22–5.81)
RDW: 23.1 % — ABNORMAL HIGH (ref 11.5–15.5)
WBC: 6.2 10*3/uL (ref 4.0–10.5)
nRBC: 0.3 % — ABNORMAL HIGH (ref 0.0–0.2)

## 2022-03-08 LAB — BASIC METABOLIC PANEL
Anion gap: 9 (ref 5–15)
BUN: 38 mg/dL — ABNORMAL HIGH (ref 8–23)
CO2: 26 mmol/L (ref 22–32)
Calcium: 8.8 mg/dL — ABNORMAL LOW (ref 8.9–10.3)
Chloride: 106 mmol/L (ref 98–111)
Creatinine, Ser: 1.83 mg/dL — ABNORMAL HIGH (ref 0.61–1.24)
GFR, Estimated: 36 mL/min — ABNORMAL LOW (ref >60)
Glucose, Bld: 76 mg/dL (ref 70–99)
Potassium: 3.9 mmol/L (ref 3.5–5.1)
Sodium: 141 mmol/L (ref 135–145)

## 2022-03-08 SURGERY — ECHOCARDIOGRAM, TRANSESOPHAGEAL
Anesthesia: Monitor Anesthesia Care

## 2022-03-08 MED ORDER — LIDOCAINE 2% (20 MG/ML) 5 ML SYRINGE
INTRAMUSCULAR | Status: DC | PRN
  Administered 2022-03-08: 40 mg via INTRAVENOUS

## 2022-03-08 MED ORDER — EPHEDRINE SULFATE-NACL 50-0.9 MG/10ML-% IV SOSY
PREFILLED_SYRINGE | INTRAVENOUS | Status: DC | PRN
  Administered 2022-03-08: 10 mg via INTRAVENOUS

## 2022-03-08 MED ORDER — SODIUM CHLORIDE 0.9 % IV SOLN
INTRAVENOUS | Status: AC | PRN
  Administered 2022-03-08: 500 mL via INTRAVENOUS

## 2022-03-08 MED ORDER — PROPOFOL 10 MG/ML IV BOLUS
INTRAVENOUS | Status: DC | PRN
  Administered 2022-03-08: 20 mg via INTRAVENOUS

## 2022-03-08 MED ORDER — PROPOFOL 500 MG/50ML IV EMUL
INTRAVENOUS | Status: DC | PRN
  Administered 2022-03-08: 100 ug/kg/min via INTRAVENOUS

## 2022-03-08 MED ORDER — DILTIAZEM HCL 60 MG PO TABS: 60.0000 mg | ORAL_TABLET | Freq: Three times a day (TID) | ORAL | Status: DC | PRN

## 2022-03-08 MED ORDER — PHENYLEPHRINE 80 MCG/ML (10ML) SYRINGE FOR IV PUSH (FOR BLOOD PRESSURE SUPPORT)
PREFILLED_SYRINGE | INTRAVENOUS | Status: DC | PRN
  Administered 2022-03-08: 160 ug via INTRAVENOUS
  Administered 2022-03-08: 240 ug via INTRAVENOUS

## 2022-03-08 NOTE — Progress Notes (Signed)
Patient sitting in chair Heart Rate now 108-110. MD for Cardiology updated via phone and new PRN Orders were placed, Maintain current care for the Patient

## 2022-03-08 NOTE — Progress Notes (Signed)
Care link here to transport Patient to [facility] for TEE/Cardioversion. Patient up in chair and with activity standing to sit on stretcher heart rate up to 130s. Once Patient on stretcher heart rate back down to the 80s. Assessment is without any other changes. Patient to [facility] for procedure

## 2022-03-08 NOTE — Progress Notes (Signed)
  Echocardiogram Echocardiogram Transesophageal has been performed.  Joshua Hudson 03/08/2022, 2:41 PM

## 2022-03-08 NOTE — Progress Notes (Signed)
Patient is NPO for TEE/Cardioversion. Patient states "I will take my Eliquis and Amnio PO but not the rest on an empty stomach"

## 2022-03-08 NOTE — Interval H&P Note (Signed)
History and Physical Interval Note:  03/08/2022 1:45 PM  Joshua Hudson  has presented today for surgery, with the diagnosis of aflutter.  The various methods of treatment have been discussed with the patient and family. After consideration of risks, benefits and other options for treatment, the patient has consented to  Procedure(s): TRANSESOPHAGEAL ECHOCARDIOGRAM (TEE) (N/A) CARDIOVERSION (N/A) as a surgical intervention.  The patient's history has been reviewed, patient examined, no change in status, stable for surgery.  I have reviewed the patient's chart and labs.  Questions were answered to the patient's satisfaction.     [name]

## 2022-03-08 NOTE — Anesthesia Postprocedure Evaluation (Signed)
Anesthesia Post Note  Patient: Joshua Hudson  Procedure(s) Performed: TRANSESOPHAGEAL ECHOCARDIOGRAM (TEE) CARDIOVERSION     Patient location during evaluation: Endoscopy Anesthesia Type: MAC Level of consciousness: awake Pain management: pain level controlled Vital Signs Assessment: post-procedure vital signs reviewed and stable Respiratory status: spontaneous breathing, nonlabored ventilation, respiratory function stable and patient connected to nasal cannula oxygen Cardiovascular status: stable and blood pressure returned to baseline Postop Assessment: no apparent nausea or vomiting Anesthetic complications: no   No notable events documented.  Last Vitals:  Vitals:   03/08/22 1838 03/08/22 2024  BP:  99/81  Pulse: (!) 110 (!) 102  Resp:  18  Temp:  37 C  SpO2:  100%    Last Pain:  Vitals:   03/08/22 2024  TempSrc: Oral  PainSc:                  Karyl Kinnier Ulice Follett

## 2022-03-08 NOTE — Progress Notes (Signed)
Patient is scheduled for TEE procedure at [facility]. Patient is NPO for this procedure per Endo department at [facility] Oral Medications can be given with small amount of water. RN explained to patient and Patient states "I can take my Amiodarone and Eliquis but I can't the rest on a empty stomach"

## 2022-03-08 NOTE — CV Procedure (Signed)
     Transesophageal Echocardiogram Note  Joshua Hudson [id number] 1935/05/29  Procedure: Transesophageal Echocardiogram Indications: Atrial fibrillation  Procedure Details Consent: Obtained Time Out: Verified patient identification, verified procedure, site/side was marked, verified correct patient position, special equipment/implants available, Radiology Safety Procedures followed,  medications/allergies/relevent history reviewed, required imaging and test results available.  Performed  Medications: Propofol: '90mg'$   Left Ventrical:  LVEF 50-55%  Mitral Valve: Normal structure, mild MR  Aortic Valve: S/p valve in valve TAVR, trivial paravalvular leak. Gastric views not obtained due to difficulty passing probe into the stomach.  Tricuspid Valve: Normal structure, mild-to-moderate TR  Pulmonic Valve: Normal structure. Trivial PI  Left Atrium/ Left atrial appendage: No evidence of LAA thrombus  Atrial septum: No PFO visualized by color doppler  Aorta: Grade III plaque  Following TEE, the patient underwent successful DCCV with 150J, 200J with successful return to sinus bradycardia   Complications: No apparent complications Patient did tolerate procedure well.  [name], MD 03/08/2022, 2:21 PM

## 2022-03-08 NOTE — CV Procedure (Signed)
Procedure: Electrical Cardioversion Indications:  Atrial Fibrillation  Procedure Details:  Consent: Risks of procedure as well as the alternatives and risks of each were explained to the (patient/caregiver).  Consent for procedure obtained.  Time Out: Verified patient identification, verified procedure, site/side was marked, verified correct patient position, special equipment/implants available, medications/allergies/relevent history reviewed, required imaging and test results available. PERFORMED.  Patient placed on cardiac monitor, pulse oximetry, supplemental oxygen as necessary.  Sedation given:  Propofol '90mg'$  Pacer pads placed anterior and posterior chest.  Cardioverted 1 time(s).  Cardioversion with synchronized biphasic 150J, 200J shock.  Evaluation: Findings: Post procedure EKG shows:  Sinus bradycardia with PACs Complications: None Patient did tolerate procedure well.  Time Spent Directly with the Patient:  74mnutes   HFreada Bergeron7/01/2022, 2:25 PM

## 2022-03-08 NOTE — Progress Notes (Signed)
PROGRESS NOTE   Joshua Hudson  FYB:[id number] DOB: [birth year] DOA: 02/28/2022 PCP: Lavone Orn, MD  Brief Narrative:  87 year old black male paroxysmal A-fib CHA2DS2-VASc >4/Eliquis (diagnosed in 2021) Bioprosthetic AV valve Thrombocytopenia CKD 3B Rheumatoid arthritis Combined systolic and diastolic heart failure-EF 55-60% 02/24/2022 HTN HLD reflux Achalasia cardia  Hospitalization recently 621-02/25/2022 with melena EGD = erosive gastropathy transfused 2 units PRBC recommended to take Protonix 40 twice daily and hold Eliquis for 1 week  Represented 02/28/2022 with several further episodes of black stools Pulse rate 111 BP 121/68 BUNs/creatinine 33/2.3 (baseline 1.7-2.1) magnesium 1.9 CT abdomen pelvis without contrast = multifactorial degradation proctitis  6/29-cardiology consulted for A-fib RVR and hypotension 6/30-GI consulted-EGD few bleeding angiectasia's in duodenum and jejunum 6/30 colonoscopy nonbleeding angiectasia internal hemorrhoids 3 mm polyp in ascending colon resected  Hospital-Problem based course  Melena Status post scopes as above hemoglobin is stable Will need to resume anticoagulation at full dose after a week Continue Protonix 40 twice daily Anemia of acute blood loss Status posttransfusion 1 unit, received IV iron 6/24, 7/3 Hemoglobin is stable Chronic atrial fibrillation CHADVASC >4 Prolonged QT interval Was loaded with amiodarone as had A-fib with RVR on admission and was transitioned to oral 400 twice daily Getting TEE cardioversion 7/5 Is on low-dose Eliquis 2.5 twice daily and likely can transition to 5 mg twice daily 1 week after scope Monitor trends of QT HFpEF with history of aortic valve Medications adjusted as per cardiology now on 80 of Lasix daily noted initially Rheumatoid arthritis Arava held due to thrombocytopenia patient Prednisone 5 mg BPH Continue Flomax 0.4 finasteride held AKI on admission superimposed on CKD 3B baseline 1.8  creatinine Peak creatinine was 2.8 now improved   DVT prophylaxis: Eliquis low-dose-needs to be increased Code Status: DNR Family Communication: Discussed with wife who was present Disposition:  Status is: Inpatient Remains inpatient appropriate because:   Cardioversion today and then decision about planning   Consultants:  Cardiology  Procedures: None yet  Antimicrobials: No   Subjective: Feels weak and has felt this way for the past 6 months-can walk 2 the restroom about 10 to 15 feet away and back and then has to rest No chest pain no fever no chills No nausea no vomiting No further bleeding  Objective: Vitals:   03/07/22 0814 03/07/22 1109 03/07/22 2100 03/08/22 0530  BP: 101/71 113/71 102/84 93/70  Pulse: 85 85 82 80  Resp: 18 18    Temp: 98 F (36.7 C) 98.2 F (36.8 C) 98.3 F (36.8 C) 98.4 F (36.9 C)  TempSrc: Oral Oral Oral Oral  SpO2: 100% 100% 99% 100%  Weight:    48 kg  Height:        Intake/Output Summary (Last 24 hours) at 03/08/2022 0747 Last data filed at 03/07/2022 2100 Gross per 24 hour  Intake 960 ml  Output 1200 ml  Net -240 ml   Filed Weights   03/01/22 1432 03/07/22 0427 03/08/22 0530  Weight: 48.8 kg 48 kg 48 kg    Examination:  Alert coherent no icterus no pallor chest clear no added sound Neck soft supple S1-S2 A-fib on monitors with runs of the same Abdomen soft no rebound no guarding Neurologically intact no focal deficit Moving 4 limbs equally Lower extremity has pitting edema grade 2  Data Reviewed: personally reviewed   CBC    Component Value Date/Time   WBC 6.2 03/08/2022 0636   RBC 3.54 (L) 03/08/2022 0636   HGB 8.5 (L) 03/08/2022  0636   HGB 7.4 (L) 02/06/2022 0747   HGB 9.0 (L) 12/19/2019 0836   HGB 11.7 (L) 09/18/2014 1100   HCT 28.0 (L) 03/08/2022 0636   HCT 27.8 (L) 12/19/2019 0836   HCT 34.8 (L) 09/18/2014 1100   PLT 148 (L) 03/08/2022 0636   PLT 114 (L) 02/06/2022 0747   PLT 210 12/19/2019 0836   MCV  79.1 (L) 03/08/2022 0636   MCV 67 (L) 12/19/2019 0836   MCV 69 (L) 09/18/2014 1100   MCH 24.0 (L) 03/08/2022 0636   MCHC 30.4 03/08/2022 0636   RDW 23.1 (H) 03/08/2022 0636   RDW 16.8 (H) 12/19/2019 0836   RDW 16.9 (H) 09/18/2014 1100   LYMPHSABS 0.5 (L) 02/06/2022 0747   LYMPHSABS 0.4 (L) 04/23/2019 1432   LYMPHSABS 0.6 (L) 09/18/2014 1100   MONOABS 0.4 02/06/2022 0747   EOSABS 0.2 02/06/2022 0747   EOSABS 0.5 (H) 04/23/2019 1432   EOSABS 0.0 09/18/2014 1100   BASOSABS 0.0 02/06/2022 0747   BASOSABS 0.0 04/23/2019 1432   BASOSABS 0.0 09/18/2014 1100      Latest Ref Rng & Units 03/08/2022    5:04 AM 03/07/2022    7:47 AM 03/06/2022    7:51 AM  CMP  Glucose 70 - 99 mg/dL 76  77  100   BUN 8 - 23 mg/dL 38  30  32   Creatinine 0.61 - 1.24 mg/dL 1.83  1.56  1.72   Sodium 135 - 145 mmol/L 141  143  143   Potassium 3.5 - 5.1 mmol/L 3.9  3.5  3.6   Chloride 98 - 111 mmol/L 106  109  111   CO2 22 - 32 mmol/L '26  27  22   '$ Calcium 8.9 - 10.3 mg/dL 8.8  8.6  8.5      Radiology Studies: No results found.   Scheduled Meds:  sodium chloride   Intravenous Once   amiodarone  400 mg Oral BID   apixaban  2.5 mg Oral BID   folic acid  1 mg Oral Daily   furosemide  80 mg Oral Daily   levothyroxine  88 mcg Oral q morning   pantoprazole  40 mg Oral BID   potassium chloride  20 mEq Oral Daily   predniSONE  5 mg Oral Q breakfast   saccharomyces boulardii  250 mg Oral BID   tamsulosin  0.4 mg Oral Daily   Continuous Infusions:  sodium chloride 20 mL/hr at 03/08/22 6606   sodium chloride       LOS: 8 days   Time spent: [place], MD [facility] To contact the attending provider between 7A-7P or the covering provider during after hours 7P-7A, please log into the web site [email or web address] and access using universal [place] password for that web site. If you do not have the password, please call the hospital operator.  03/08/2022, 7:47 AM

## 2022-03-08 NOTE — Anesthesia Preprocedure Evaluation (Signed)
Anesthesia Evaluation  Patient identified by MRN, date of birth, ID band Patient awake    Reviewed: Allergy & Precautions, NPO status , Patient's Chart, lab work & pertinent test results  Airway Mallampati: II  TM Distance: >3 FB Neck ROM: Full    Dental no notable dental hx.    Pulmonary former smoker,    Pulmonary exam normal        Cardiovascular hypertension, +CHF  Normal cardiovascular exam     Neuro/Psych negative neurological ROS  negative psych ROS   GI/Hepatic Neg liver ROS, GERD  Medicated and Controlled,  Endo/Other  Hypothyroidism   Renal/GU Renal disease     Musculoskeletal  (+) Arthritis ,   Abdominal   Peds  Hematology  (+) Blood dyscrasia, anemia ,   Anesthesia Other Findings A-flutter  Reproductive/Obstetrics                             Anesthesia Physical Anesthesia Plan  ASA: 3  Anesthesia Plan: MAC   Post-op Pain Management:    Induction: Intravenous  PONV Risk Score and Plan: 1 and Propofol infusion and Treatment may vary due to age or medical condition  Airway Management Planned: Nasal Cannula  Additional Equipment:   Intra-op Plan:   Post-operative Plan:   Informed Consent: I have reviewed the patients History and Physical, chart, labs and discussed the procedure including the risks, benefits and alternatives for the proposed anesthesia with the patient or authorized representative who has indicated his/her understanding and acceptance.   Patient has DNR.  Discussed DNR with patient and Suspend DNR.   Dental advisory given  Plan Discussed with: CRNA  Anesthesia Plan Comments:         Anesthesia Quick Evaluation

## 2022-03-08 NOTE — Progress Notes (Signed)
Patient on Telemetry noted to have a sustained Heart rate 145-155. Pt sitting up in chair without any other noted acute changes. MD Cardiology notified and new orders to be placed

## 2022-03-08 NOTE — Transfer of Care (Signed)
Immediate Anesthesia Transfer of Care Note  Patient: Joshua Hudson  Procedure(s) Performed: TRANSESOPHAGEAL ECHOCARDIOGRAM (TEE) CARDIOVERSION  Patient Location: Endoscopy Unit  Anesthesia Type:MAC  Level of Consciousness: drowsy and patient cooperative  Airway & Oxygen Therapy: Patient Spontanous Breathing  Post-op Assessment: Report given to RN  Post vital signs: Reviewed and stable  Last Vitals:  Vitals Value Taken Time  BP 104/60   Temp    Pulse 58 03/08/22 1428  Resp 15 03/08/22 1428  SpO2 94 % 03/08/22 1428  Vitals shown include unvalidated device data.  Last Pain:  Vitals:   03/08/22 1304  TempSrc: Temporal  PainSc: 0-No pain         Complications: No notable events documented.

## 2022-03-08 NOTE — Progress Notes (Signed)
Progress Note  Patient Name: Joshua Hudson Date of Encounter: 03/08/2022  [facility] Cardiologist: [name], MD   Subjective   Seen in the [facility] endoscopy suite after recovering from sedation for TEE-cardioversion. No evidence of left atrial thrombus on TEE, successfully underwent cardioversion with a 200 J shock to sinus bradycardia, but went back into atrial tachyarrhythmia just 15 minutes later.  Rhythm seems to be a atrial tachycardia or slow atrial flutter with an atrial rate of about 150 bpm and Mobitz type I second-degree AV block.  Inpatient Medications    Scheduled Meds:  sodium chloride   Intravenous Once   amiodarone  400 mg Oral BID   apixaban  2.5 mg Oral BID   folic acid  1 mg Oral Daily   furosemide  80 mg Oral Daily   levothyroxine  88 mcg Oral q morning   pantoprazole  40 mg Oral BID   potassium chloride  20 mEq Oral Daily   predniSONE  5 mg Oral Q breakfast   saccharomyces boulardii  250 mg Oral BID   tamsulosin  0.4 mg Oral Daily   Continuous Infusions:  PRN Meds: acetaminophen **OR** acetaminophen, ketotifen, mouth rinse, traZODone   Vital Signs    Vitals:   03/08/22 1500 03/08/22 1510 03/08/22 1521 03/08/22 1530  BP: 125/82 (!) 120/94 112/74 121/86  Pulse: 93 93 69 68  Resp: '20 19 12 20  '$ Temp:      TempSrc:      SpO2: 97% 97% 98% 97%  Weight:      Height:        Intake/Output Summary (Last 24 hours) at 03/08/2022 1619 Last data filed at 03/08/2022 1600 Gross per 24 hour  Intake 659.33 ml  Output 700 ml  Net -40.67 ml      03/08/2022    5:30 AM 03/07/2022    4:27 AM 03/01/2022    2:32 PM  Last 3 Weights  Weight (lbs) 105 lb 12.8 oz 105 lb 14.2 oz 107 lb 9.4 oz  Weight (kg) 47.991 kg 48.03 kg 48.8 kg      Telemetry    Atypical atrial flutter- Personally Reviewed  ECG    Seems to be a slow atrial flutter or atrial tachycardia with a rate of 150 bpm and Mobitz type I second-degree AV block with a ventricular rate of 90 bpm-  Personally Reviewed  Physical Exam  Elderly, underweight, frail GEN: No acute distress.   Neck: No JVD Cardiac: Irregular, no murmurs, rubs, or gallops.  Respiratory: Clear to auscultation bilaterally. GI: Soft, nontender, non-distended  MS: No edema; No deformity. Neuro:  Nonfocal  Psych: Normal affect   Labs    High Sensitivity Troponin:   Recent Labs  Lab 02/22/22 1244 02/22/22 1444 02/28/22 2109 02/28/22 2239  TROPONINIHS 77* 69* 49* 48*     Chemistry Recent Labs  Lab 03/02/22 1513 03/04/22 0538 03/06/22 0751 03/07/22 0747 03/08/22 0504  NA 138   < > 143 143 141  K 4.1   < > 3.6 3.5 3.9  CL 107   < > 111 109 106  CO2 23   < > '22 27 26  '$ GLUCOSE 126*   < > 100* 77 76  BUN 32*   < > 32* 30* 38*  CREATININE 1.82*   < > 1.72* 1.56* 1.83*  CALCIUM 8.3*   < > 8.5* 8.6* 8.8*  MG 2.1  --   --   --   --  GFRNONAA 36*   < > 38* 43* 36*  ANIONGAP 8   < > '10 7 9   '$ < > = values in this interval not displayed.    Lipids No results for input(s): "CHOL", "TRIG", "HDL", "LABVLDL", "LDLCALC", "CHOLHDL" in the last 168 hours.  Hematology Recent Labs  Lab 03/06/22 0751 03/07/22 0747 03/08/22 0636  WBC 8.0 6.9 6.2  RBC 3.73* 3.54* 3.54*  HGB 9.1* 8.7* 8.5*  HCT 29.4* 27.2* 28.0*  MCV 78.8* 76.8* 79.1*  MCH 24.4* 24.6* 24.0*  MCHC 31.0 32.0 30.4  RDW 22.6* 22.2* 23.1*  PLT 114* 113* 148*   Thyroid No results for input(s): "TSH", "FREET4" in the last 168 hours.  BNPNo results for input(s): "BNP", "PROBNP" in the last 168 hours.  DDimer No results for input(s): "DDIMER" in the last 168 hours.   Radiology    ECHO TEE  Result Date: 03/08/2022    TRANSESOPHOGEAL ECHO REPORT   Patient Name:   Joshua Hudson Date of Exam: 03/08/2022 Medical Rec #:  [id number]       Height:       63.0 in Accession #:    [id number]      Weight:       105.8 lb Date of Birth:  1935-02-23       BSA:          1.475 m Patient Age:    6 years        BP:           109/61 mmHg Patient Gender: M                HR:           82 bpm. Exam Location:  Inpatient Procedure: Transesophageal Echo, Color Doppler, Cardiac Doppler and 3D Echo Indications:     I48.91* Unspeicified atrial fibrillation  History:         Patient has prior history of Echocardiogram examinations, most                  recent 02/24/2022. CHF, Arrythmias:Atrial Fibrillation; Risk                  Factors:Hypertension and GERD.                  Aortic Valve: 26 mm Sapien prosthetic, stented (TAVR) valve is                  present in the aortic position. Procedure Date: 04/15/19.  Sonographer:     Bernadene Person RDCS Referring Phys:  [id number] Greer Ee [place] Diagnosing Phys: Gwyndolyn Kaufman MD PROCEDURE: After discussion of the risks and benefits of a TEE, an informed consent was obtained from the patient. The transesophogeal probe was passed without difficulty through the esophogus of the patient. Sedation performed by different physician. The patient was monitored while under deep sedation. Anesthestetic sedation was provided intravenously by Anesthesiology: 111.'2mg'$  of Propofol, '40mg'$  of Lidocaine. The patient's vital signs; including heart rate, blood pressure, and oxygen saturation; remained stable throughout the procedure. The patient developed no complications during the procedure. IMPRESSIONS  1. Left ventricular ejection fraction, by estimation, is 50 to 55%. The left ventricle has low normal function.  2. Right ventricular systolic function was not well visualized. The right ventricular size is mildly enlarged.  3. Left atrial size was moderately dilated. No left atrial/left atrial appendage thrombus was detected. The LAA emptying velocity was 20 cm/s.  4. Right atrial  size was mildly dilated.  5. The mitral valve is grossly normal. Mild mitral valve regurgitation.  6. Tricuspid valve regurgitation is mild to moderate.  7. The aortic valve has been repaired/replaced. Patient had initial surgical bioprosthetic prosthesis followed by a  valve-in-valve TAVR with a 26 mm Edwards Sapien prosthetic (TAVR) valve. Procedure Date: 04/15/19. Echo findings are consistent with normal structure and function of the aortic valve prosthesis. Trivial perivalvular leak. Gastric views not obtained due to difficulty passing echo probe. Visually, there is normal leaflet motion with no evidence of aortic stenosis.  8. Following TEE, the patient underwent DCCV with 150J, 200J with initial return to sinus bradycardia. FINDINGS  Left Ventricle: Left ventricular ejection fraction, by estimation, is 50 to 55%. The left ventricle has low normal function. The left ventricular internal cavity size was normal in size. Right Ventricle: The right ventricular size is mildly enlarged. No increase in right ventricular wall thickness. Right ventricular systolic function was not well visualized. Left Atrium: Left atrial size was moderately dilated. No left atrial/left atrial appendage thrombus was detected. The LAA emptying velocity was 20 cm/s. Right Atrium: Right atrial size was mildly dilated. Pericardium: There is no evidence of pericardial effusion. Mitral Valve: The mitral valve is grossly normal. There is mild thickening of the mitral valve leaflet(s). There is mild calcification of the mitral valve leaflet(s). Mild mitral valve regurgitation. Tricuspid Valve: The tricuspid valve is normal in structure. Tricuspid valve regurgitation is mild to moderate. Aortic Valve: The aortic valve has been repaired/replaced. Aortic valve regurgitation is not visualized. Patient is s/p valve-in-valve TAVR with a 26 mm Edwards Sapien prosthetic, stented (TAVR) valve. Procedure Date: 04/15/19. Echo findings are consistent with normal structure and function of the aortic valve prosthesis. Pulmonic Valve: The pulmonic valve was normal in structure. Pulmonic valve regurgitation is trivial. Aorta: The aortic root is normal in size and structure. IAS/Shunts: There is right bowing of the interatrial  septum, suggestive of elevated left atrial pressure. The atrial septum is grossly normal. Gwyndolyn Kaufman MD Electronically signed by Gwyndolyn Kaufman MD Signature Date/Time: 03/08/2022/3:44:34 PM    Final     Cardiac Studies   TEE 03/08/2022   1. Left ventricular ejection fraction, by estimation, is 50 to 55%. The left ventricle has low normal function.  2. Right ventricular systolic function was not well visualized. The right ventricular size is mildly enlarged.  3. Left atrial size was moderately dilated. No left atrial/left atrial appendage thrombus was detected. The LAA emptying velocity was 20 cm/s.  4. Right atrial size was mildly dilated.  5. The mitral valve is grossly normal. Mild mitral valve regurgitation.  6. Tricuspid valve regurgitation is mild to moderate.  7. The aortic valve has been repaired/replaced. Patient had initial surgical bioprosthetic prosthesis followed by a valve-in-valve TAVR with a 26 mm Edwards Sapien prosthetic (TAVR) valve. Procedure Date: 04/15/19. Echo findings are consistent with normal structure and function of the aortic valve prosthesis. Trivial perivalvular leak. Gastric views not obtained due to difficulty passing echo probe. Visually, there is normal leaflet motion with no evidence of aortic stenosis.  8. Following TEE, the patient underwent DCCV with 150J, 200J with initial return to sinus bradycardia  Patient Profile     86 y.o. male with history of previous cavotricuspid isthmus ablation for atrial flutter (2021), TAVR valve-in-valve for prosthetic aortic valve insufficiency (2020, 26 mm Edwards S3U inside 25 mm Edwards magna bioprosthesis implanted in 2009), rheumatoid arthritis, chronic anemia with hemoglobin around 8, admitted with  GI bleed and found to have atypical atrial flutter/atrial fibrillation rapid ventricular response leading to decompensation of diastolic heart failure  Assessment & Plan    General net diuresis.  Weight unchanged from  yesterday.  Seems to have less lower extremity edema on my exam. Unfortunately, has recurrent atrial arrhythmia (suspect left atrial slow flutter based on ECG) early following cardioversion.  Retrospectively, suspect that this is the same arrhythmia that led to hospitalization and he had 1: 1 AV conduction at the time.  Now well rate controlled on amiodarone.   Consider another attempt at cardioversion after an additional 3-4 weeks of oral amiodarone and uninterrupted anticoagulation. Discuss option for repeat ablation w Dr. Lovena Le, if this fails.     For questions or updates, please contact [place] Please consult [email or web address] for contact info under        Signed, Sanda Klein, MD  03/08/2022, 4:19 PM

## 2022-03-09 DIAGNOSIS — I5033 Acute on chronic diastolic (congestive) heart failure: Secondary | ICD-10-CM | POA: Diagnosis not present

## 2022-03-09 DIAGNOSIS — I484 Atypical atrial flutter: Secondary | ICD-10-CM | POA: Diagnosis not present

## 2022-03-09 DIAGNOSIS — K922 Gastrointestinal hemorrhage, unspecified: Secondary | ICD-10-CM | POA: Diagnosis not present

## 2022-03-09 LAB — BASIC METABOLIC PANEL
Anion gap: 8 (ref 5–15)
BUN: 36 mg/dL — ABNORMAL HIGH (ref 8–23)
CO2: 26 mmol/L (ref 22–32)
Calcium: 8.5 mg/dL — ABNORMAL LOW (ref 8.9–10.3)
Chloride: 107 mmol/L (ref 98–111)
Creatinine, Ser: 1.75 mg/dL — ABNORMAL HIGH (ref 0.61–1.24)
GFR, Estimated: 38 mL/min — ABNORMAL LOW (ref >60)
Glucose, Bld: 76 mg/dL (ref 70–99)
Potassium: 3.4 mmol/L — ABNORMAL LOW (ref 3.5–5.1)
Sodium: 141 mmol/L (ref 135–145)

## 2022-03-09 LAB — MAGNESIUM: Magnesium: 1.8 mg/dL (ref 1.7–2.4)

## 2022-03-09 MED ORDER — MAGNESIUM OXIDE -MG SUPPLEMENT 400 (240 MG) MG PO TABS
400.0000 mg | ORAL_TABLET | Freq: Two times a day (BID) | ORAL | Status: DC
  Administered 2022-03-09 – 2022-03-11 (×5): 400 mg via ORAL
  Filled 2022-03-09 (×5): qty 1

## 2022-03-09 MED ORDER — SODIUM CHLORIDE 0.9 % IV SOLN: INTRAVENOUS | Status: DC

## 2022-03-09 MED ORDER — SODIUM CHLORIDE 0.9 % IV BOLUS
250.0000 mL | Freq: Once | INTRAVENOUS | Status: AC
  Administered 2022-03-09: 250 mL via INTRAVENOUS

## 2022-03-09 NOTE — Progress Notes (Signed)
PROGRESS NOTE   Joshua Hudson  GYF:[id number] DOB: [birth year] DOA: 02/28/2022 PCP: Lavone Orn, MD  Brief Narrative:  87 year old black male paroxysmal A-fib CHA2DS2-VASc >4/Eliquis (diagnosed in 2021) Bioprosthetic AV valve Thrombocytopenia CKD 3B Rheumatoid arthritis Combined systolic and diastolic heart failure-EF 55-60% 02/24/2022 HTN HLD reflux Achalasia cardia  Hospitalization recently 621-02/25/2022 with melena EGD = erosive gastropathy transfused 2 units PRBC recommended to take Protonix 40 twice daily and hold Eliquis for 1 week  Represented 02/28/2022 with several further episodes of black stools Pulse rate 111 BP 121/68 BUNs/creatinine 33/2.3 (baseline 1.7-2.1) magnesium 1.9 CT abdomen pelvis without contrast = multifactorial degradation proctitis  6/29-cardiology consulted for A-fib RVR and hypotension 6/30-GI consulted-EGD few bleeding angiectasia's in duodenum and jejunum 6/30 colonoscopy nonbleeding angiectasia internal hemorrhoids 3 mm polyp in ascending colon resected  Hospital-Problem based course  Melena Status post scopes as above hemoglobin is stable Will need to resume anticoagulation at full dose on 7/8 Continue Protonix 40 twice daily Anemia of acute blood loss Status posttransfusion 1 unit, received IV iron 6/24, 7/3 Hemoglobin is stable Hypotension 03/09/2022 Likely secondary to diuretics, Cardizem, Flomax All diuretics on hold holding Cardizem Bolus 250 cc of saline --50 cc/H NS Reevaluate a.m. Chronic atrial fibrillation CHADVASC >4 Prolonged QT interval Was loaded with amiodarone as had A-fib with RVR on admission and was transitioned to oral 400 twice daily TEE cardioversion 7/5 unsuccessful status in A-fib--- Will need outpatient reeval in 3 months Resume higher dose Eliquis 03/11/2022 HFpEF with history of aortic valve Medications adjusted as per cardiology  Rheumatoid arthritis Arava held due to thrombocytopenia patient Prednisone 5  mg BPH Continue Flomax 0.4 finasteride held AKI on admission superimposed on CKD 3B baseline 1.8 creatinine Peak creatinine was 2.8-steadily improving   DVT prophylaxis: Eliquis low-dose-needs to be increased Code Status: DNR Family Communication: Discussed with wife who was present Disposition:  Status is: Inpatient Remains inpatient appropriate because:   Cardioversion today and then decision about planning   Consultants:  Cardiology  Procedures: None yet  Antimicrobials: No   Subjective:  Dizzy but this is not out of the ordinary Orthostatics showed systolics in the 91M He is awake coherent however He has difficulty moving around when he feels this way No chest pain no fever  Objective: Vitals:   03/09/22 0425 03/09/22 0757 03/09/22 1012 03/09/22 1050  BP: (!) 81/57 (!) 103/57 (!) 86/60   Pulse: 85 66 98   Resp: 16     Temp: 98.4 F (36.9 C) 98.4 F (36.9 C)    TempSrc: Oral Oral    SpO2: 98% 100%    Weight:    48.9 kg  Height:        Intake/Output Summary (Last 24 hours) at 03/09/2022 1304 Last data filed at 03/09/2022 1000 Gross per 24 hour  Intake 1379.33 ml  Output --  Net 1379.33 ml    Filed Weights   03/07/22 0427 03/08/22 0530 03/09/22 1050  Weight: 48 kg 48 kg 48.9 kg    Examination:  EOMI NCAT no focal deficit Chest clear no rales rhonchi wheeze ROM intact Still has lower extremity edema S1-S2 in A-fib rate controlled at this time Neurologically intact moving 4 limbs equally  Data Reviewed: personally reviewed   CBC    Component Value Date/Time   WBC 6.2 03/08/2022 0636   RBC 3.54 (L) 03/08/2022 0636   HGB 8.5 (L) 03/08/2022 0636   HGB 7.4 (L) 02/06/2022 0747   HGB 9.0 (L) 12/19/2019 3846  HGB 11.7 (L) 09/18/2014 1100   HCT 28.0 (L) 03/08/2022 0636   HCT 27.8 (L) 12/19/2019 0836   HCT 34.8 (L) 09/18/2014 1100   PLT 148 (L) 03/08/2022 0636   PLT 114 (L) 02/06/2022 0747   PLT 210 12/19/2019 0836   MCV 79.1 (L) 03/08/2022  0636   MCV 67 (L) 12/19/2019 0836   MCV 69 (L) 09/18/2014 1100   MCH 24.0 (L) 03/08/2022 0636   MCHC 30.4 03/08/2022 0636   RDW 23.1 (H) 03/08/2022 0636   RDW 16.8 (H) 12/19/2019 0836   RDW 16.9 (H) 09/18/2014 1100   LYMPHSABS 0.5 (L) 02/06/2022 0747   LYMPHSABS 0.4 (L) 04/23/2019 1432   LYMPHSABS 0.6 (L) 09/18/2014 1100   MONOABS 0.4 02/06/2022 0747   EOSABS 0.2 02/06/2022 0747   EOSABS 0.5 (H) 04/23/2019 1432   EOSABS 0.0 09/18/2014 1100   BASOSABS 0.0 02/06/2022 0747   BASOSABS 0.0 04/23/2019 1432   BASOSABS 0.0 09/18/2014 1100      Latest Ref Rng & Units 03/09/2022    5:20 AM 03/08/2022    5:04 AM 03/07/2022    7:47 AM  CMP  Glucose 70 - 99 mg/dL 76  76  77   BUN 8 - 23 mg/dL 36  38  30   Creatinine 0.61 - 1.24 mg/dL 1.75  1.83  1.56   Sodium 135 - 145 mmol/L 141  141  143   Potassium 3.5 - 5.1 mmol/L 3.4  3.9  3.5   Chloride 98 - 111 mmol/L 107  106  109   CO2 22 - 32 mmol/L '26  26  27   '$ Calcium 8.9 - 10.3 mg/dL 8.5  8.8  8.6      Radiology Studies: ECHO TEE  Result Date: 03/08/2022    TRANSESOPHOGEAL ECHO REPORT   Patient Name:   Joshua Hudson Date of Exam: 03/08/2022 Medical Rec #:  [id number]       Height:       63.0 in Accession #:    [id number]      Weight:       105.8 lb Date of Birth:  [birth year]       BSA:          1.475 m Patient Age:    32 years        BP:           109/61 mmHg Patient Gender: M               HR:           82 bpm. Exam Location:  Inpatient Procedure: Transesophageal Echo, Color Doppler, Cardiac Doppler and 3D Echo Indications:     I48.91* Unspeicified atrial fibrillation  History:         Patient has prior history of Echocardiogram examinations, most                  recent 02/24/2022. CHF, Arrythmias:Atrial Fibrillation; Risk                  Factors:Hypertension and GERD.                  Aortic Valve: 26 mm Sapien prosthetic, stented (TAVR) valve is                  present in the aortic position. Procedure Date: 04/15/19.  Sonographer:     Bernadene Person RDCS Referring Phys:  [id number] [place] Diagnosing Phys: Gwyndolyn Kaufman  MD PROCEDURE: After discussion of the risks and benefits of a TEE, an informed consent was obtained from the patient. The transesophogeal probe was passed without difficulty through the esophogus of the patient. Sedation performed by different physician. The patient was monitored while under deep sedation. Anesthestetic sedation was provided intravenously by Anesthesiology: 111.'2mg'$  of Propofol, '40mg'$  of Lidocaine. The patient's vital signs; including heart rate, blood pressure, and oxygen saturation; remained stable throughout the procedure. The patient developed no complications during the procedure. IMPRESSIONS  1. Left ventricular ejection fraction, by estimation, is 50 to 55%. The left ventricle has low normal function.  2. Right ventricular systolic function was not well visualized. The right ventricular size is mildly enlarged.  3. Left atrial size was moderately dilated. No left atrial/left atrial appendage thrombus was detected. The LAA emptying velocity was 20 cm/s.  4. Right atrial size was mildly dilated.  5. The mitral valve is grossly normal. Mild mitral valve regurgitation.  6. Tricuspid valve regurgitation is mild to moderate.  7. The aortic valve has been repaired/replaced. Patient had initial surgical bioprosthetic prosthesis followed by a valve-in-valve TAVR with a 26 mm Edwards Sapien prosthetic (TAVR) valve. Procedure Date: 04/15/19. Echo findings are consistent with normal structure and function of the aortic valve prosthesis. Trivial perivalvular leak. Gastric views not obtained due to difficulty passing echo probe. Visually, there is normal leaflet motion with no evidence of aortic stenosis.  8. Following TEE, the patient underwent DCCV with 150J, 200J with initial return to sinus bradycardia. FINDINGS  Left Ventricle: Left ventricular ejection fraction, by estimation, is 50 to 55%. The left ventricle  has low normal function. The left ventricular internal cavity size was normal in size. Right Ventricle: The right ventricular size is mildly enlarged. No increase in right ventricular wall thickness. Right ventricular systolic function was not well visualized. Left Atrium: Left atrial size was moderately dilated. No left atrial/left atrial appendage thrombus was detected. The LAA emptying velocity was 20 cm/s. Right Atrium: Right atrial size was mildly dilated. Pericardium: There is no evidence of pericardial effusion. Mitral Valve: The mitral valve is grossly normal. There is mild thickening of the mitral valve leaflet(s). There is mild calcification of the mitral valve leaflet(s). Mild mitral valve regurgitation. Tricuspid Valve: The tricuspid valve is normal in structure. Tricuspid valve regurgitation is mild to moderate. Aortic Valve: The aortic valve has been repaired/replaced. Aortic valve regurgitation is not visualized. Patient is s/p valve-in-valve TAVR with a 26 mm Edwards Sapien prosthetic, stented (TAVR) valve. Procedure Date: 04/15/19. Echo findings are consistent with normal structure and function of the aortic valve prosthesis. Pulmonic Valve: The pulmonic valve was normal in structure. Pulmonic valve regurgitation is trivial. Aorta: The aortic root is normal in size and structure. IAS/Shunts: There is right bowing of the interatrial septum, suggestive of elevated left atrial pressure. The atrial septum is grossly normal. Gwyndolyn Kaufman MD Electronically signed by Gwyndolyn Kaufman MD Signature Date/Time: 03/08/2022/3:44:34 PM    Final      Scheduled Meds:  sodium chloride   Intravenous Once   amiodarone  400 mg Oral BID   apixaban  2.5 mg Oral BID   folic acid  1 mg Oral Daily   levothyroxine  88 mcg Oral q morning   magnesium oxide  400 mg Oral BID   pantoprazole  40 mg Oral BID   potassium chloride  20 mEq Oral Daily   predniSONE  5 mg Oral Q breakfast   saccharomyces boulardii  250 mg  Oral BID   tamsulosin  0.4 mg Oral Daily   Continuous Infusions:  sodium chloride 50 mL/hr at 03/09/22 1154     LOS: 9 days   Time spent: [place], MD [facility] To contact the attending provider between 7A-7P or the covering provider during after hours 7P-7A, please log into the web site [email or web address] and access using universal [place] password for that web site. If you do not have the password, please call the hospital operator.  03/09/2022, 1:04 PM

## 2022-03-09 NOTE — Progress Notes (Addendum)
Progress Note  Patient Name: Joshua Hudson Date of Encounter: 03/09/2022  [facility] Cardiologist: [name], MD   Subjective   Feels light-headed and dizzy this am, BP is low  Inpatient Medications    Scheduled Meds:  sodium chloride   Intravenous Once   amiodarone  400 mg Oral BID   apixaban  2.5 mg Oral BID   folic acid  1 mg Oral Daily   furosemide  80 mg Oral Daily   levothyroxine  88 mcg Oral q morning   magnesium oxide  400 mg Oral BID   pantoprazole  40 mg Oral BID   potassium chloride  20 mEq Oral Daily   predniSONE  5 mg Oral Q breakfast   saccharomyces boulardii  250 mg Oral BID   tamsulosin  0.4 mg Oral Daily   Continuous Infusions:  PRN Meds: acetaminophen **OR** acetaminophen, diltiazem, ketotifen, mouth rinse, traZODone   Vital Signs    Vitals:   03/08/22 1838 03/08/22 2024 03/09/22 0425 03/09/22 0757  BP:  99/81 (!) 81/57 (!) 103/57  Pulse: (!) 110 (!) 102 85 66  Resp:  18 16   Temp:  98.6 F (37 C) 98.4 F (36.9 C) 98.4 F (36.9 C)  TempSrc:  Oral Oral Oral  SpO2:  100% 98% 100%  Weight:      Height:        Intake/Output Summary (Last 24 hours) at 03/09/2022 1001 Last data filed at 03/09/2022 0930 Gross per 24 hour  Intake 1379.33 ml  Output --  Net 1379.33 ml      03/08/2022    5:30 AM 03/07/2022    4:27 AM 03/01/2022    2:32 PM  Last 3 Weights  Weight (lbs) 105 lb 12.8 oz 105 lb 14.2 oz 107 lb 9.4 oz  Weight (kg) 47.991 kg 48.03 kg 48.8 kg      Telemetry    Mobitz I w/ episodes of atypical flutter Personally Reviewed  ECG    None today - Personally Reviewed  Physical Exam   General: frail, elderly, male very weak Head: Eyes PERRLA, Head normocephalic and atraumatic Lungs: few scattered rales bilaterally to auscultation. Heart: HR slightly irregular, S1 S2, without rub or gallop. No murmur. 4/4 extremity pulses are 2+ & equal. JVD mild Abdomen: Bowel sounds are present, abdomen soft and non-tender without masses or   hernias noted. Msk: weak strength and tone for age. Extremities: No clubbing, cyanosis or edema.    Skin:  No rashes or lesions noted. Neuro: Alert and oriented X 3. Psych:  Good affect, responds appropriately  Labs    High Sensitivity Troponin:   Recent Labs  Lab 02/22/22 1244 02/22/22 1444 02/28/22 2109 02/28/22 2239  TROPONINIHS 77* 69* 49* 48*     Chemistry Recent Labs  Lab 03/02/22 1513 03/04/22 0538 03/07/22 0747 03/08/22 0504 03/09/22 0520  NA 138   < > 143 141 141  K 4.1   < > 3.5 3.9 3.4*  CL 107   < > 109 106 107  CO2 23   < > '27 26 26  '$ GLUCOSE 126*   < > 77 76 76  BUN 32*   < > 30* 38* 36*  CREATININE 1.82*   < > 1.56* 1.83* 1.75*  CALCIUM 8.3*   < > 8.6* 8.8* 8.5*  MG 2.1  --   --   --  1.8  GFRNONAA 36*   < > 43* 36* 38*  ANIONGAP 8   < >  $'7 9 8   'D$ < > = values in this interval not displayed.    Lipids No results for input(s): "CHOL", "TRIG", "HDL", "LABVLDL", "LDLCALC", "CHOLHDL" in the last 168 hours.  Hematology Recent Labs  Lab 03/06/22 0751 03/07/22 0747 03/08/22 0636  WBC 8.0 6.9 6.2  RBC 3.73* 3.54* 3.54*  HGB 9.1* 8.7* 8.5*  HCT 29.4* 27.2* 28.0*  MCV 78.8* 76.8* 79.1*  MCH 24.4* 24.6* 24.0*  MCHC 31.0 32.0 30.4  RDW 22.6* 22.2* 23.1*  PLT 114* 113* 148*   Thyroid No results for input(s): "TSH", "FREET4" in the last 168 hours.  BNPNo results for input(s): "BNP", "PROBNP" in the last 168 hours.  DDimer No results for input(s): "DDIMER" in the last 168 hours.   Radiology    ECHO TEE  Result Date: 03/08/2022    TRANSESOPHOGEAL ECHO REPORT   Patient Name:   JACON WHETZEL Date of Exam: 03/08/2022 Medical Rec #:  [id number]       Height:       63.0 in Accession #:    [id number]      Weight:       105.8 lb Date of Birth:  November 27, 1934       BSA:          1.475 m Patient Age:    87 years        BP:           109/61 mmHg Patient Gender: M               HR:           82 bpm. Exam Location:  Inpatient Procedure: Transesophageal Echo, Color Doppler,  Cardiac Doppler and 3D Echo Indications:     I48.91* Unspeicified atrial fibrillation  History:         Patient has prior history of Echocardiogram examinations, most                  recent 02/24/2022. CHF, Arrythmias:Atrial Fibrillation; Risk                  Factors:Hypertension and GERD.                  Aortic Valve: 26 mm Sapien prosthetic, stented (TAVR) valve is                  present in the aortic position. Procedure Date: 04/15/19.  Sonographer:     Bernadene Person RDCS Referring Phys:  [id number] Greer Ee [place] Diagnosing Phys: Gwyndolyn Kaufman MD PROCEDURE: After discussion of the risks and benefits of a TEE, an informed consent was obtained from the patient. The transesophogeal probe was passed without difficulty through the esophogus of the patient. Sedation performed by different physician. The patient was monitored while under deep sedation. Anesthestetic sedation was provided intravenously by Anesthesiology: 111.'2mg'$  of Propofol, '40mg'$  of Lidocaine. The patient's vital signs; including heart rate, blood pressure, and oxygen saturation; remained stable throughout the procedure. The patient developed no complications during the procedure. IMPRESSIONS  1. Left ventricular ejection fraction, by estimation, is 50 to 55%. The left ventricle has low normal function.  2. Right ventricular systolic function was not well visualized. The right ventricular size is mildly enlarged.  3. Left atrial size was moderately dilated. No left atrial/left atrial appendage thrombus was detected. The LAA emptying velocity was 20 cm/s.  4. Right atrial size was mildly dilated.  5. The mitral valve is grossly normal. Mild mitral valve regurgitation.  6. Tricuspid valve regurgitation is mild to moderate.  7. The aortic valve has been repaired/replaced. Patient had initial surgical bioprosthetic prosthesis followed by a valve-in-valve TAVR with a 26 mm Edwards Sapien prosthetic (TAVR) valve. Procedure Date: 04/15/19. Echo  findings are consistent with normal structure and function of the aortic valve prosthesis. Trivial perivalvular leak. Gastric views not obtained due to difficulty passing echo probe. Visually, there is normal leaflet motion with no evidence of aortic stenosis.  8. Following TEE, the patient underwent DCCV with 150J, 200J with initial return to sinus bradycardia. FINDINGS  Left Ventricle: Left ventricular ejection fraction, by estimation, is 50 to 55%. The left ventricle has low normal function. The left ventricular internal cavity size was normal in size. Right Ventricle: The right ventricular size is mildly enlarged. No increase in right ventricular wall thickness. Right ventricular systolic function was not well visualized. Left Atrium: Left atrial size was moderately dilated. No left atrial/left atrial appendage thrombus was detected. The LAA emptying velocity was 20 cm/s. Right Atrium: Right atrial size was mildly dilated. Pericardium: There is no evidence of pericardial effusion. Mitral Valve: The mitral valve is grossly normal. There is mild thickening of the mitral valve leaflet(s). There is mild calcification of the mitral valve leaflet(s). Mild mitral valve regurgitation. Tricuspid Valve: The tricuspid valve is normal in structure. Tricuspid valve regurgitation is mild to moderate. Aortic Valve: The aortic valve has been repaired/replaced. Aortic valve regurgitation is not visualized. Patient is s/p valve-in-valve TAVR with a 26 mm Edwards Sapien prosthetic, stented (TAVR) valve. Procedure Date: 04/15/19. Echo findings are consistent with normal structure and function of the aortic valve prosthesis. Pulmonic Valve: The pulmonic valve was normal in structure. Pulmonic valve regurgitation is trivial. Aorta: The aortic root is normal in size and structure. IAS/Shunts: There is right bowing of the interatrial septum, suggestive of elevated left atrial pressure. The atrial septum is grossly normal. Gwyndolyn Kaufman MD Electronically signed by Gwyndolyn Kaufman MD Signature Date/Time: 03/08/2022/3:44:34 PM    Final     Cardiac Studies   TEE 03/08/2022  1. Left ventricular ejection fraction, by estimation, is 50 to 55%. The left ventricle has low normal function.  2. Right ventricular systolic function was not well visualized. The right ventricular size is mildly enlarged.  3. Left atrial size was moderately dilated. No left atrial/left atrial appendage thrombus was detected. The LAA emptying velocity was 20 cm/s.  4. Right atrial size was mildly dilated.  5. The mitral valve is grossly normal. Mild mitral valve regurgitation.  6. Tricuspid valve regurgitation is mild to moderate.  7. The aortic valve has been repaired/replaced. Patient had initial surgical bioprosthetic prosthesis followed by a valve-in-valve TAVR with a 26 mm Edwards Sapien prosthetic (TAVR) valve. Procedure Date: 04/15/19. Echo findings are consistent with normal structure and function of the aortic valve prosthesis. Trivial perivalvular leak. Gastric views not obtained due to difficulty passing echo probe. Visually, there is normal leaflet motion with no evidence of aortic stenosis.  8. Following TEE, the patient underwent DCCV with 150J, 200J with initial return to sinus bradycardia  Patient Profile     87 y.o. male with history of previous cavotricuspid isthmus ablation for atrial flutter (2021), TAVR valve-in-valve for prosthetic aortic valve insufficiency (2020, 26 mm Edwards S3U inside 25 mm Edwards magna bioprosthesis implanted in 2009), rheumatoid arthritis, chronic anemia with hemoglobin around 8, admitted with GI bleed and found to have atypical atrial flutter/atrial fibrillation rapid ventricular response leading to decompensation of diastolic  heart failure  Assessment & Plan    Recurrent atrial arrhythmia - s/p TEE/DCCV 07/05 with early recurrence of atrial arrhythmia. - rapid atrial flutter episodes w/ Mobitz I 2nd degree  block -  HR ok - on amio 400 mg bid - load amio 2-3 weeks and continue anticoag >> DCCV as outpt.  - if fails this, may need repeat ablation  - has mostly been in Mobitz I overnight, some episodes of rapid flutter, but HR never low  2. HFpEF - I/O woefully incomplete - pta was on torsemide 40 mg qd - got Lasix 20 mg IV x 1 on admit, then Lasix 40 mg qd 2 days later, then Lasix 80 mg qd starting 07/04 but not given 07/05.  - need today's wt  3. Hypotension - po intake decreased because of the procedure yesterday - did not get Lasix 80 mg and flomax 0.4 mg for the same reason - today, SBP low 80s, increased to 92 systolic when lying flat, dropped to 78 when orthostatics checked, standing BP not done - will give 250 cc bolus and 50 cc/hr for now - hold Lasix and Flomax again today     For questions or updates, please contact [place] Please consult [email or web address] for contact info under        Signed, Rosaria Ferries, PA-C  03/09/2022, 10:01 AM    I have seen and examined the patient along with Rosaria Ferries, PA-C.  I have reviewed the chart, notes and new data.  I agree with PA/NP's note.  Key new complaints: Dizzy due to hypotension.  Denies dyspnea. Key examination changes: Still has mild ankle edema Key new findings / data: Mild hypokalemia.  Creatinine at baseline.  PLAN: Hold diuretics today.  Plan to restart his home torsemide tomorrow if blood pressure allows.  Once he is no longer hypotensive we will plan to discharge home with a scheduled cardioversion in 3 weeks, after he is better loaded on amiodarone.  Sanda Klein, MD, [place] [phone] 03/09/2022, 12:05 PM

## 2022-03-09 NOTE — Care Management Important Message (Signed)
Important Message  Patient Details IM Letter given to the Patient Name: Joshua Hudson MRN: [id number] Date of Birth: 02/06/1935   Medicare Important Message Given:  Yes     Kerin Salen 03/09/2022, 1:30 PM

## 2022-03-09 NOTE — Progress Notes (Signed)
Physical Therapy Treatment Patient Details Name: Joshua Hudson MRN: [id number] DOB: May 12, 1935 Today's Date: 03/09/2022   History of Present Illness 87 yo male admitted with recurrent GI bleed, anemia. s/p polypectomy, hemostasis clip, colonoscopy, enteroscopy 6/30. Hx of CHF, Afib, CKD, RA, AICD    PT Comments    Patient continues to mobilize well but fatigues quickly and reports SOB with activity and increase in HR. Pt ambulated ~ 140' with no AD and min guard for safety, pt with mild balance impairments but no LOB. HR max of 135 bpm during gait and recovered to 100 bpm with seated rest. HR increased to 130 bpm with repeat sit<>stands as well. Time spent at [place] pt importance of energy conservation and moving slowly to manage DOE. Will continue to progress in acute setting. Recommend pt discuss cardiac rehab with his cardiologist and initiate when appropriate.     Recommendations for follow up therapy are one component of a multi-disciplinary discharge planning process, led by the attending physician.  Recommendations may be updated based on patient status, additional functional criteria and insurance authorization.  Follow Up Recommendations  Other (comment) (cardiac rehab)     Assistance Recommended at Discharge PRN  Patient can return home with the following Assistance with cooking/housework;Assist for transportation;Help with stairs or ramp for entrance;A little help with bathing/dressing/bathroom   Equipment Recommendations  None recommended by PT    Recommendations for Other Services       Precautions / Restrictions Precautions Precautions: Fall Precaution Comments: monitor HR Restrictions Weight Bearing Restrictions: No     Mobility  Bed Mobility               General bed mobility comments: in recliner    Transfers Overall transfer level: Needs assistance Equipment used: None Transfers: Sit to/from Stand Sit to Stand: Supervision            General transfer comment: supervision for safety with power up form recliner. pt usin gUE to initiate rise. Completed 5x sit<>stand at EOS, Hr tarted at 100 bpm and reached max of 130 bpm.    Ambulation/Gait Ambulation/Gait assistance: Min guard Gait Distance (Feet): 140 Feet Assistive device: None Gait Pattern/deviations: Step-through pattern, Decreased stride length, Knee flexed in stance - right, Knee flexed in stance - left Gait velocity: decr     General Gait Details: Moves quickly. HR up to 135 bpm, dyspnea 2/4. Min guard for safety, pt reports slightly dizzy/light headed all morning and this worsened some with mobility but recovered as his HR returned to rest of 100-110 bpm.   Stairs             Wheelchair Mobility    Modified Rankin (Stroke Patients Only)       Balance Overall balance assessment: Mild deficits observed, not formally tested                                          Cognition Arousal/Alertness: Awake/alert Behavior During Therapy: WFL for tasks assessed/performed Overall Cognitive Status: Within Functional Limits for tasks assessed                                          Exercises      General Comments        Pertinent Vitals/Pain Pain  Assessment Pain Assessment: No/denies pain    Home Living                          Prior Function            PT Goals (current goals can now be found in the care plan section) Acute Rehab PT Goals Patient Stated Goal: to be independent back home PT Goal Formulation: With patient Time For Goal Achievement: 03/18/22 Potential to Achieve Goals: Good Progress towards PT goals: Progressing toward goals    Frequency    Min 3X/week      PT Plan Current plan remains appropriate    Co-evaluation              AM-PAC PT "6 Clicks" Mobility   Outcome Measure  Help needed turning from your back to your side while in a flat bed without using  bedrails?: None Help needed moving from lying on your back to sitting on the side of a flat bed without using bedrails?: A Little Help needed moving to and from a bed to a chair (including a wheelchair)?: A Little Help needed standing up from a chair using your arms (e.g., wheelchair or bedside chair)?: A Little Help needed to walk in hospital room?: A Little Help needed climbing 3-5 steps with a railing? : A Little 6 Click Score: 19    End of Session Equipment Utilized During Treatment: Gait belt Activity Tolerance: Patient tolerated treatment well Patient left: in chair;with call bell/phone within reach;with family/visitor present Nurse Communication: Mobility status PT Visit Diagnosis: Unsteadiness on feet (R26.81)     Time: 9390-3009 PT Time Calculation (min) (ACUTE ONLY): 20 min  Charges:  $Therapeutic Exercise: 8-22 mins                     Verner Mould, DPT Acute Rehabilitation Services Office [phone] Pager [phone]  03/09/22 9:42 AM

## 2022-03-09 NOTE — Progress Notes (Signed)
   03/09/22 1332  Assess: MEWS Score  Temp 98.6 F (37 C)  BP 99/66  MAP (mmHg) 77  Pulse Rate (!) 106  Level of Consciousness Alert  SpO2 100 %  O2 Device Room Air  Assess: MEWS Score  MEWS Temp 0  MEWS Systolic 1  MEWS Pulse 1  MEWS RR 0  MEWS LOC 0  MEWS Score 2  MEWS Score Color Yellow  Assess: if the MEWS score is Yellow or Red  Were vital signs taken at a resting state? Yes  Focused Assessment No change from prior assessment  Does the patient meet 2 or more of the SIRS criteria? No  MEWS guidelines implemented *See Row Information* No, previously yellow, continue vital signs every 4 hours  Treat  Pain Scale 0-10  Pain Score 0  Notify: Charge Nurse/RN  Name of Charge Nurse/RN Notified Rise Paganini, RN  Date Charge Nurse/RN Notified 03/09/22  Time Charge Nurse/RN Notified 1338  Assess: SIRS CRITERIA  SIRS Temperature  0  SIRS Pulse 1  SIRS Respirations  0  SIRS WBC 0  SIRS Score Sum  1

## 2022-03-10 ENCOUNTER — Telehealth: Payer: Self-pay | Admitting: Cardiology

## 2022-03-10 DIAGNOSIS — I484 Atypical atrial flutter: Secondary | ICD-10-CM | POA: Diagnosis not present

## 2022-03-10 DIAGNOSIS — K922 Gastrointestinal hemorrhage, unspecified: Secondary | ICD-10-CM | POA: Diagnosis not present

## 2022-03-10 LAB — BASIC METABOLIC PANEL
Anion gap: 7 (ref 5–15)
BUN: 34 mg/dL — ABNORMAL HIGH (ref 8–23)
CO2: 25 mmol/L (ref 22–32)
Calcium: 8.5 mg/dL — ABNORMAL LOW (ref 8.9–10.3)
Chloride: 110 mmol/L (ref 98–111)
Creatinine, Ser: 1.44 mg/dL — ABNORMAL HIGH (ref 0.61–1.24)
GFR, Estimated: 48 mL/min — ABNORMAL LOW (ref >60)
Glucose, Bld: 83 mg/dL (ref 70–99)
Potassium: 3.7 mmol/L (ref 3.5–5.1)
Sodium: 142 mmol/L (ref 135–145)

## 2022-03-10 LAB — MAGNESIUM: Magnesium: 2 mg/dL (ref 1.7–2.4)

## 2022-03-10 MED ORDER — TORSEMIDE 20 MG PO TABS
20.0000 mg | ORAL_TABLET | Freq: Every day | ORAL | Status: DC
  Administered 2022-03-11: 20 mg via ORAL
  Filled 2022-03-10: qty 1

## 2022-03-10 NOTE — Telephone Encounter (Signed)
  TOC appointment on 03/21/22 at 10:15 am with Nicholes Rough per Rosaria Ferries

## 2022-03-10 NOTE — Progress Notes (Addendum)
Progress Note  Patient Name: Joshua Hudson Date of Encounter: 03/10/2022  [facility] Cardiologist: [name], MD   Subjective   Blood pressure is improving but continues to have dizziness, worse with standing. No chest pain Breathing improving gradually Heart rate went to 140s with ambulation with PT yesterday, came down with rest  Inpatient Medications    Scheduled Meds:  sodium chloride   Intravenous Once   amiodarone  400 mg Oral BID   apixaban  2.5 mg Oral BID   folic acid  1 mg Oral Daily   levothyroxine  88 mcg Oral q morning   magnesium oxide  400 mg Oral BID   pantoprazole  40 mg Oral BID   potassium chloride  20 mEq Oral Daily   predniSONE  5 mg Oral Q breakfast   saccharomyces boulardii  250 mg Oral BID   tamsulosin  0.4 mg Oral Daily   Continuous Infusions:  sodium chloride 50 mL/hr at 03/10/22 0054   PRN Meds: acetaminophen **OR** acetaminophen, ketotifen, mouth rinse, traZODone   Vital Signs    Vitals:   03/09/22 2037 03/10/22 0423 03/10/22 0500 03/10/22 0804  BP: 103/82 (!) 88/69  106/79  Pulse: (!) 55 98    Resp: 18 18    Temp: 98.5 F (36.9 C) 98.1 F (36.7 C)    TempSrc: Oral Oral    SpO2: 98% 99%    Weight:   50.6 kg   Height:        Intake/Output Summary (Last 24 hours) at 03/10/2022 1007 Last data filed at 03/10/2022 0916 Gross per 24 hour  Intake 1633.01 ml  Output --  Net 1633.01 ml      03/10/2022    5:00 AM 03/09/2022   10:50 AM 03/08/2022    5:30 AM  Last 3 Weights  Weight (lbs) 111 lb 8 oz 107 lb 12.8 oz 105 lb 12.8 oz  Weight (kg) 50.576 kg 48.898 kg 47.991 kg      Telemetry    Atrial fibrillation, heart rate 90s- Personally Reviewed  ECG    N/A  Physical Exam   GEN: No acute distress.   Neck: No JVD Cardiac: Ir Ir,  no murmurs, rubs, or gallops.  Respiratory: Clear to auscultation bilaterally. GI: Soft, nontender, non-distended  MS: trace to 1 + edema with stockings,  No deformity. Neuro:  Nonfocal   Psych: Normal affect   Labs    High Sensitivity Troponin:   Recent Labs  Lab 02/22/22 1244 02/22/22 1444 02/28/22 2109 02/28/22 2239  TROPONINIHS 77* 69* 49* 48*     Chemistry Recent Labs  Lab 03/08/22 0504 03/09/22 0520 03/10/22 0556  NA 141 141 142  K 3.9 3.4* 3.7  CL 106 107 110  CO2 '26 26 25  '$ GLUCOSE 76 76 83  BUN 38* 36* 34*  CREATININE 1.83* 1.75* 1.44*  CALCIUM 8.8* 8.5* 8.5*  MG  --  1.8 2.0  GFRNONAA 36* 38* 48*  ANIONGAP '9 8 7    '$ Lipids No results for input(s): "CHOL", "TRIG", "HDL", "LABVLDL", "LDLCALC", "CHOLHDL" in the last 168 hours.  Hematology Recent Labs  Lab 03/06/22 0751 03/07/22 0747 03/08/22 0636  WBC 8.0 6.9 6.2  RBC 3.73* 3.54* 3.54*  HGB 9.1* 8.7* 8.5*  HCT 29.4* 27.2* 28.0*  MCV 78.8* 76.8* 79.1*  MCH 24.4* 24.6* 24.0*  MCHC 31.0 32.0 30.4  RDW 22.6* 22.2* 23.1*  PLT 114* 113* 148*    Radiology    ECHO TEE  Result Date: 03/08/2022    TRANSESOPHOGEAL ECHO REPORT   Patient Name:   Joshua Hudson Date of Exam: 03/08/2022 Medical Rec #:  [id number]       Height:       63.0 in Accession #:    [id number]      Weight:       105.8 lb Date of Birth:  September 01, 1935       BSA:          1.475 m Patient Age:    87 years        BP:           109/61 mmHg Patient Gender: M               HR:           82 bpm. Exam Location:  Inpatient Procedure: Transesophageal Echo, Color Doppler, Cardiac Doppler and 3D Echo Indications:     I48.91* Unspeicified atrial fibrillation  History:         Patient has prior history of Echocardiogram examinations, most                  recent 02/24/2022. CHF, Arrythmias:Atrial Fibrillation; Risk                  Factors:Hypertension and GERD.                  Aortic Valve: 26 mm Sapien prosthetic, stented (TAVR) valve is                  present in the aortic position. Procedure Date: 04/15/19.  Sonographer:     Bernadene Person RDCS Referring Phys:  [id number] Greer Ee PEMBERTON Diagnosing Phys: Gwyndolyn Kaufman MD PROCEDURE: After  discussion of the risks and benefits of a TEE, an informed consent was obtained from the patient. The transesophogeal probe was passed without difficulty through the esophogus of the patient. Sedation performed by different physician. The patient was monitored while under deep sedation. Anesthestetic sedation was provided intravenously by Anesthesiology: 111.'2mg'$  of Propofol, '40mg'$  of Lidocaine. The patient's vital signs; including heart rate, blood pressure, and oxygen saturation; remained stable throughout the procedure. The patient developed no complications during the procedure. IMPRESSIONS  1. Left ventricular ejection fraction, by estimation, is 50 to 55%. The left ventricle has low normal function.  2. Right ventricular systolic function was not well visualized. The right ventricular size is mildly enlarged.  3. Left atrial size was moderately dilated. No left atrial/left atrial appendage thrombus was detected. The LAA emptying velocity was 20 cm/s.  4. Right atrial size was mildly dilated.  5. The mitral valve is grossly normal. Mild mitral valve regurgitation.  6. Tricuspid valve regurgitation is mild to moderate.  7. The aortic valve has been repaired/replaced. Patient had initial surgical bioprosthetic prosthesis followed by a valve-in-valve TAVR with a 26 mm Edwards Sapien prosthetic (TAVR) valve. Procedure Date: 04/15/19. Echo findings are consistent with normal structure and function of the aortic valve prosthesis. Trivial perivalvular leak. Gastric views not obtained due to difficulty passing echo probe. Visually, there is normal leaflet motion with no evidence of aortic stenosis.  8. Following TEE, the patient underwent DCCV with 150J, 200J with initial return to sinus bradycardia. FINDINGS  Left Ventricle: Left ventricular ejection fraction, by estimation, is 50 to 55%. The left ventricle has low normal function. The left ventricular internal cavity size was normal in size. Right Ventricle: The right  ventricular size is mildly enlarged.  No increase in right ventricular wall thickness. Right ventricular systolic function was not well visualized. Left Atrium: Left atrial size was moderately dilated. No left atrial/left atrial appendage thrombus was detected. The LAA emptying velocity was 20 cm/s. Right Atrium: Right atrial size was mildly dilated. Pericardium: There is no evidence of pericardial effusion. Mitral Valve: The mitral valve is grossly normal. There is mild thickening of the mitral valve leaflet(s). There is mild calcification of the mitral valve leaflet(s). Mild mitral valve regurgitation. Tricuspid Valve: The tricuspid valve is normal in structure. Tricuspid valve regurgitation is mild to moderate. Aortic Valve: The aortic valve has been repaired/replaced. Aortic valve regurgitation is not visualized. Patient is s/p valve-in-valve TAVR with a 26 mm Edwards Sapien prosthetic, stented (TAVR) valve. Procedure Date: 04/15/19. Echo findings are consistent with normal structure and function of the aortic valve prosthesis. Pulmonic Valve: The pulmonic valve was normal in structure. Pulmonic valve regurgitation is trivial. Aorta: The aortic root is normal in size and structure. IAS/Shunts: There is right bowing of the interatrial septum, suggestive of elevated left atrial pressure. The atrial septum is grossly normal. Gwyndolyn Kaufman MD Electronically signed by Gwyndolyn Kaufman MD Signature Date/Time: 03/08/2022/3:44:34 PM    Final     Cardiac Studies   TEE/DCCV 03/08/2022  1. Left ventricular ejection fraction, by estimation, is 50 to 55%. The  left ventricle has low normal function.   2. Right ventricular systolic function was not well visualized. The right  ventricular size is mildly enlarged.   3. Left atrial size was moderately dilated. No left atrial/left atrial  appendage thrombus was detected. The LAA emptying velocity was 20 cm/s.   4. Right atrial size was mildly dilated.   5. The mitral  valve is grossly normal. Mild mitral valve regurgitation.   6. Tricuspid valve regurgitation is mild to moderate.   7. The aortic valve has been repaired/replaced. Patient had initial  surgical bioprosthetic prosthesis followed by a valve-in-valve TAVR with a  26 mm Edwards Sapien prosthetic (TAVR) valve. Procedure Date: 04/15/19.  Echo findings are consistent with  normal structure and function of the aortic valve prosthesis. Trivial  perivalvular leak. Gastric views not obtained due to difficulty passing  echo probe. Visually, there is normal leaflet motion with no evidence of  aortic stenosis.   8. Following TEE, the patient underwent DCCV with 150J, 200J with initial  return to sinus bradycardia.   TTE 02/24/22 1. Left ventricular ejection fraction, by estimation, is 55 to 60%. The  left ventricle has normal function. The left ventricle has no regional  wall motion abnormalities. Left ventricular diastolic function could not  be evaluated.   2. Right ventricular systolic function is moderately reduced. The right  ventricular size is moderately enlarged.   3. Left atrial size was severely dilated.   4. Right atrial size was mildly dilated.   5. The mitral valve is normal in structure. Mild mitral valve  regurgitation.   6. Tricuspid valve regurgitation is moderate.   7. The aortic valve has been repaired/replaced. Aortic valve  regurgitation is not visualized. No aortic stenosis is present.   Patient Profile     87 y.o. male with a hx of RA, aortic stenosis status post TAVR (04/2019 26 mm Edwards SAPIEN 3) and typical atrial flutter status post RFA 10/2019 and chronic anemia (hemoglobin 8) admitted with GI bleed who is being seen since 03/02/2022 for the evaluation of atrial fibrillation/flutter with RVR.  Assessment & Plan     atypical  atrial flutter/atrial fibrillation rapid ventricular -Underwent successful TEE guided cardioversion but reverted back to atrial  fibrillation/flutter -Brief episode of [place] 1 with reasonable heart rate -Currently in atrial fibrillation at heart rate of 90s -Continue amiodarone 400 mg twice daily -Continue anticoagulation -For outpatient cardioversion after 3 weeks of anticoagulation  2.  HFpEF 3.  Hypertension -Intermittently getting IV Lasix.  Was on torsemide 40 mg daily at home -Diuretics due to soft blood pressure with possible orthostasis -Review diuretic regimens at discharge -Continue compression stocking -Continues to feel dizzy  Otherwise per primary team   For questions or updates, please contact [place] Please consult [email or web address] for contact info under        Signed, Leanor Kail, PA  03/10/2022, 10:07 AM     I have seen and examined the patient along with Leanor Kail, PA .  I have reviewed the chart, notes and new data.  I agree with PA/NP's note.  Key new complaints: orthostatic dizziness, no dyspnea at rest Key examination changes: 1+ ankle edema Key new findings / data: persistent AFib/atypical flutter, VR 80s at night, 100s during day. Creatinine improved, almost at baseline  PLAN: [place] will sign off.   Medication Recommendations:   Hold diuretics for another day. Resume home dose of torsemide 40 mg daily when BP allows. Continue uninterrupted Eliquis 2.5 mg twice daily (<60 kg, >80 years). Reduce amiodarone to 200 mg once daily in another week (on 03/18/2022). Other recommendations (labs, testing, etc):  LFTS and TSH Q 6 months while on amiodarone. F/U BMET and CBC at next office visit Follow up as an outpatient:  Has 07/18 F/U scheduled w Nicholes Rough, PA  Another attempt at [place] after another 3 weeks of PO amiodarone load, TEE will not be necessary.  Sanda Klein, MD, [place] [phone] 03/10/2022, 1:19 PM

## 2022-03-10 NOTE — Progress Notes (Signed)
PROGRESS NOTE   Joshua Hudson  DZH:[id number] DOB: [birth year] DOA: 02/28/2022 PCP: Joshua Orn, MD  Brief Narrative:  87 year old black male paroxysmal A-fib CHA2DS2-VASc >4/Eliquis (diagnosed in 2021) Bioprosthetic AV valve Thrombocytopenia CKD 3B Rheumatoid arthritis Combined systolic and diastolic heart failure-EF 55-60% 02/24/2022 HTN HLD reflux Achalasia cardia  Hospitalization recently 621-02/25/2022 with melena EGD = erosive gastropathy transfused 2 units PRBC recommended to take Protonix 40 twice daily and hold Eliquis for 1 week  Represented 02/28/2022 with several further episodes of black stools Pulse rate 111 BP 121/68 BUNs/creatinine 33/2.3 (baseline 1.7-2.1) magnesium 1.9 CT abdomen pelvis without contrast = multifactorial degradation proctitis  6/29-cardiology consulted for A-fib RVR and hypotension 6/30-GI consulted-EGD few bleeding angiectasia's in duodenum and jejunum 6/30 colonoscopy nonbleeding angiectasia internal hemorrhoids 3 mm polyp in ascending colon resected 7/5-TEE cardioversion attempt for afib, unsuccessful  Hospital-Problem based course  Melena Status post scopes as above hemoglobin is stable Will need to resume anticoagulation at full dose on 7/8 Continue Protonix 40 twice daily Anemia of acute blood loss Status posttransfusion 1 unit, received IV iron 6/24, 7/3 Hemoglobin is stable Hypotension 03/09/2022 Likely secondary to diuretics, Cardizem, Flomax All diuretics on hold ---holding Cardizem Bolus 250 cc of saline --50 cc/H NS Reevaluate a.m. Chronic atrial fibrillation CHADVASC >4 Prolonged QT interval Was loaded with amiodarone as had A-fib with RVR on admission and was transitioned to oral 400 twice daily TEE cardioversion 7/5 unsuccessful status in A-fib--- Will need outpatient reeval in 3 months Resume higher dose Eliquis 03/11/2022 Cannot titrate BB or cardizem 2/2 hypotension--this may be his new normal in terms of symptoms Further  planning as per Cardiology- HFpEF with history of aortic valve Medications adjusted as per cardiology  Rheumatoid arthritis Arava held due to thrombocytopenia patient Prednisone 5 mg BPH Continue Flomax 0.4 finasteride held AKI on admission superimposed on CKD 3B baseline 1.8 creatinine Peak creatinine was 2.8-steadily improving   DVT prophylaxis: Eliquis low-dose-needs to be increased Code Status: DNR Family Communication: Discussed with wife who was present Disposition:  Status is: Inpatient Remains inpatient appropriate because:   Cardioversion today and then decision about planning   Consultants:  Cardiology  Procedures: None yet  Antimicrobials: No   Subjective:  Dizzy--able however to walk in hallway fairly comfortably with therapy No cp fever No cough cold    Objective: Vitals:   03/09/22 2037 03/10/22 0423 03/10/22 0500 03/10/22 0804  BP: 103/82 (!) 88/69  106/79  Pulse: (!) 55 98    Resp: 18 18    Temp: 98.5 F (36.9 C) 98.1 F (36.7 C)    TempSrc: Oral Oral    SpO2: 98% 99%    Weight:   50.6 kg   Height:        Intake/Output Summary (Last 24 hours) at 03/10/2022 1114 Last data filed at 03/10/2022 0916 Gross per 24 hour  Intake 1633.01 ml  Output --  Net 1633.01 ml    Filed Weights   03/08/22 0530 03/09/22 1050 03/10/22 0500  Weight: 48 kg 48.9 kg 50.6 kg    Examination:  EOMI NCAT no focal deficit Chest clear no rales rhonchi wheeze  ROM intact Still has lower extremity edema S1-S2 in A-fib rate 90-120 Neurologically intact moving 4 limbs equally  Data Reviewed: personally reviewed   CBC    Component Value Date/Time   WBC 6.2 03/08/2022 0636   RBC 3.54 (L) 03/08/2022 0636   HGB 8.5 (L) 03/08/2022 0636   HGB 7.4 (L) 02/06/2022 4196  HGB 9.0 (L) 12/19/2019 0836   HGB 11.7 (L) 09/18/2014 1100   HCT 28.0 (L) 03/08/2022 0636   HCT 27.8 (L) 12/19/2019 0836   HCT 34.8 (L) 09/18/2014 1100   PLT 148 (L) 03/08/2022 0636   PLT 114  (L) 02/06/2022 0747   PLT 210 12/19/2019 0836   MCV 79.1 (L) 03/08/2022 0636   MCV 67 (L) 12/19/2019 0836   MCV 69 (L) 09/18/2014 1100   MCH 24.0 (L) 03/08/2022 0636   MCHC 30.4 03/08/2022 0636   RDW 23.1 (H) 03/08/2022 0636   RDW 16.8 (H) 12/19/2019 0836   RDW 16.9 (H) 09/18/2014 1100   LYMPHSABS 0.5 (L) 02/06/2022 0747   LYMPHSABS 0.4 (L) 04/23/2019 1432   LYMPHSABS 0.6 (L) 09/18/2014 1100   MONOABS 0.4 02/06/2022 0747   EOSABS 0.2 02/06/2022 0747   EOSABS 0.5 (H) 04/23/2019 1432   EOSABS 0.0 09/18/2014 1100   BASOSABS 0.0 02/06/2022 0747   BASOSABS 0.0 04/23/2019 1432   BASOSABS 0.0 09/18/2014 1100      Latest Ref Rng & Units 03/10/2022    5:56 AM 03/09/2022    5:20 AM 03/08/2022    5:04 AM  CMP  Glucose 70 - 99 mg/dL 83  76  76   BUN 8 - 23 mg/dL 34  36  38   Creatinine 0.61 - 1.24 mg/dL 1.44  1.75  1.83   Sodium 135 - 145 mmol/L 142  141  141   Potassium 3.5 - 5.1 mmol/L 3.7  3.4  3.9   Chloride 98 - 111 mmol/L 110  107  106   CO2 22 - 32 mmol/L '25  26  26   '$ Calcium 8.9 - 10.3 mg/dL 8.5  8.5  8.8      Radiology Studies: ECHO TEE  Result Date: 03/08/2022    TRANSESOPHOGEAL ECHO REPORT   Patient Name:   Joshua Hudson Date of Exam: 03/08/2022 Medical Rec #:  [id number]       Height:       63.0 in Accession #:    [id number]      Weight:       105.8 lb Date of Birth:  April 16, 1935       BSA:          1.475 m Patient Age:    36 years        BP:           109/61 mmHg Patient Gender: M               HR:           82 bpm. Exam Location:  Inpatient Procedure: Transesophageal Echo, Color Doppler, Cardiac Doppler and 3D Echo Indications:     I48.91* Unspeicified atrial fibrillation  History:         Patient has prior history of Echocardiogram examinations, most                  recent 02/24/2022. CHF, Arrythmias:Atrial Fibrillation; Risk                  Factors:Hypertension and GERD.                  Aortic Valve: 26 mm Sapien prosthetic, stented (TAVR) valve is                  present in  the aortic position. Procedure Date: 04/15/19.  Sonographer:     Bernadene Person RDCS Referring Phys:  [phone]  HEATHER E PEMBERTON Diagnosing Phys: Gwyndolyn Kaufman MD PROCEDURE: After discussion of the risks and benefits of a TEE, an informed consent was obtained from the patient. The transesophogeal probe was passed without difficulty through the esophogus of the patient. Sedation performed by different physician. The patient was monitored while under deep sedation. Anesthestetic sedation was provided intravenously by Anesthesiology: 111.'2mg'$  of Propofol, '40mg'$  of Lidocaine. The patient's vital signs; including heart rate, blood pressure, and oxygen saturation; remained stable throughout the procedure. The patient developed no complications during the procedure. IMPRESSIONS  1. Left ventricular ejection fraction, by estimation, is 50 to 55%. The left ventricle has low normal function.  2. Right ventricular systolic function was not well visualized. The right ventricular size is mildly enlarged.  3. Left atrial size was moderately dilated. No left atrial/left atrial appendage thrombus was detected. The LAA emptying velocity was 20 cm/s.  4. Right atrial size was mildly dilated.  5. The mitral valve is grossly normal. Mild mitral valve regurgitation.  6. Tricuspid valve regurgitation is mild to moderate.  7. The aortic valve has been repaired/replaced. Patient had initial surgical bioprosthetic prosthesis followed by a valve-in-valve TAVR with a 26 mm Edwards Sapien prosthetic (TAVR) valve. Procedure Date: 04/15/19. Echo findings are consistent with normal structure and function of the aortic valve prosthesis. Trivial perivalvular leak. Gastric views not obtained due to difficulty passing echo probe. Visually, there is normal leaflet motion with no evidence of aortic stenosis.  8. Following TEE, the patient underwent DCCV with 150J, 200J with initial return to sinus bradycardia. FINDINGS  Left Ventricle: Left  ventricular ejection fraction, by estimation, is 50 to 55%. The left ventricle has low normal function. The left ventricular internal cavity size was normal in size. Right Ventricle: The right ventricular size is mildly enlarged. No increase in right ventricular wall thickness. Right ventricular systolic function was not well visualized. Left Atrium: Left atrial size was moderately dilated. No left atrial/left atrial appendage thrombus was detected. The LAA emptying velocity was 20 cm/s. Right Atrium: Right atrial size was mildly dilated. Pericardium: There is no evidence of pericardial effusion. Mitral Valve: The mitral valve is grossly normal. There is mild thickening of the mitral valve leaflet(s). There is mild calcification of the mitral valve leaflet(s). Mild mitral valve regurgitation. Tricuspid Valve: The tricuspid valve is normal in structure. Tricuspid valve regurgitation is mild to moderate. Aortic Valve: The aortic valve has been repaired/replaced. Aortic valve regurgitation is not visualized. Patient is s/p valve-in-valve TAVR with a 26 mm Edwards Sapien prosthetic, stented (TAVR) valve. Procedure Date: 04/15/19. Echo findings are consistent with normal structure and function of the aortic valve prosthesis. Pulmonic Valve: The pulmonic valve was normal in structure. Pulmonic valve regurgitation is trivial. Aorta: The aortic root is normal in size and structure. IAS/Shunts: There is right bowing of the interatrial septum, suggestive of elevated left atrial pressure. The atrial septum is grossly normal. Gwyndolyn Kaufman MD Electronically signed by Gwyndolyn Kaufman MD Signature Date/Time: 03/08/2022/3:44:34 PM    Final      Scheduled Meds:  sodium chloride   Intravenous Once   amiodarone  400 mg Oral BID   apixaban  2.5 mg Oral BID   folic acid  1 mg Oral Daily   levothyroxine  88 mcg Oral q morning   magnesium oxide  400 mg Oral BID   pantoprazole  40 mg Oral BID   potassium chloride  20 mEq  Oral Daily   predniSONE  5 mg Oral Q breakfast  saccharomyces boulardii  250 mg Oral BID   tamsulosin  0.4 mg Oral Daily   Continuous Infusions:  sodium chloride 50 mL/hr at 03/10/22 0054     LOS: 10 days   Time spent: [place], MD [facility] To contact the attending provider between 7A-7P or the covering provider during after hours 7P-7A, please log into the web site [email or web address] and access using universal [place] password for that web site. If you do not have the password, please call the hospital operator.  03/10/2022, 11:14 AM

## 2022-03-10 NOTE — Progress Notes (Signed)
Physical Therapy Treatment Patient Details Name: Joshua Hudson MRN: [id number] DOB: 07-Feb-1935 Today's Date: 03/10/2022   History of Present Illness 87 yo male admitted with recurrent GI bleed, anemia. s/p polypectomy, hemostasis clip, colonoscopy, enteroscopy 6/30. Hx of CHF, Afib, CKD, RA, AICD    PT Comments    Pt ambulated in hallway and continues to report dizziness which is a little worse with activity however pt states dizziness is constant even at rest.  See mobility section below for BP and HR with activity.  Pt also with bil LE edema and found with legs in dependent position in recliner on arrival so educated pt on elevation of LEs end of session.     Recommendations for follow up therapy are one component of a multi-disciplinary discharge planning process, led by the attending physician.  Recommendations may be updated based on patient status, additional functional criteria and insurance authorization.  Follow Up Recommendations  Other (comment) (cardiac rehab)     Assistance Recommended at Discharge PRN  Patient can return home with the following Assistance with cooking/housework;Assist for transportation;Help with stairs or ramp for entrance;A little help with bathing/dressing/bathroom   Equipment Recommendations  None recommended by PT    Recommendations for Other Services       Precautions / Restrictions Precautions Precautions: Fall Precaution Comments: monitor HR, BP     Mobility  Bed Mobility               General bed mobility comments: in recliner    Transfers Overall transfer level: Needs assistance Equipment used: None Transfers: Sit to/from Stand Sit to Stand: Supervision                Ambulation/Gait Ambulation/Gait assistance: Min guard Gait Distance (Feet): 280 Feet (total) Assistive device: None Gait Pattern/deviations: Step-through pattern, Decreased stride length, Knee flexed in stance - right, Knee flexed in stance - left        General Gait Details: 80'x1 seated rest  break to check BP due to dizziness then another 200'x1; BP 106/73 mmHg HR 118 after first ambulation and then 114/78 mmHg and 128 bpm after second ambulation   Stairs             Wheelchair Mobility    Modified Rankin (Stroke Patients Only)       Balance                                            Cognition Arousal/Alertness: Awake/alert Behavior During Therapy: WFL for tasks assessed/performed Overall Cognitive Status: Within Functional Limits for tasks assessed                                          Exercises      General Comments        Pertinent Vitals/Pain Pain Assessment Pain Assessment: No/denies pain    Home Living                          Prior Function            PT Goals (current goals can now be found in the care plan section) Progress towards PT goals: Progressing toward goals    Frequency    Min 3X/week  PT Plan Current plan remains appropriate    Co-evaluation              AM-PAC PT "6 Clicks" Mobility   Outcome Measure  Help needed turning from your back to your side while in a flat bed without using bedrails?: None Help needed moving from lying on your back to sitting on the side of a flat bed without using bedrails?: A Little Help needed moving to and from a bed to a chair (including a wheelchair)?: A Little Help needed standing up from a chair using your arms (e.g., wheelchair or bedside chair)?: A Little Help needed to walk in hospital room?: A Little Help needed climbing 3-5 steps with a railing? : A Little 6 Click Score: 19    End of Session   Activity Tolerance: Patient tolerated treatment well Patient left: in chair;with call bell/phone within reach;with family/visitor present Nurse Communication: Mobility status PT Visit Diagnosis: Unsteadiness on feet (R26.81);Difficulty in walking, not elsewhere classified  (R26.2)     Time: 1791-5056 PT Time Calculation (min) (ACUTE ONLY): 15 min  Charges:  $Gait Training: 8-22 mins                    Arlyce Dice, DPT Physical Therapist Acute Rehabilitation Services Preferred contact method: Secure Chat Weekend Pager Only: [phone] Office: [place] 03/10/2022, 3:31 PM

## 2022-03-11 DIAGNOSIS — K922 Gastrointestinal hemorrhage, unspecified: Secondary | ICD-10-CM | POA: Diagnosis not present

## 2022-03-11 LAB — BASIC METABOLIC PANEL
Anion gap: 6 (ref 5–15)
BUN: 25 mg/dL — ABNORMAL HIGH (ref 8–23)
CO2: 25 mmol/L (ref 22–32)
Calcium: 8.3 mg/dL — ABNORMAL LOW (ref 8.9–10.3)
Chloride: 113 mmol/L — ABNORMAL HIGH (ref 98–111)
Creatinine, Ser: 1.35 mg/dL — ABNORMAL HIGH (ref 0.61–1.24)
GFR, Estimated: 51 mL/min — ABNORMAL LOW (ref >60)
Glucose, Bld: 86 mg/dL (ref 70–99)
Potassium: 3.8 mmol/L (ref 3.5–5.1)
Sodium: 144 mmol/L (ref 135–145)

## 2022-03-11 MED ORDER — AMIODARONE HCL 400 MG PO TABS: 400.0000 mg | ORAL_TABLET | Freq: Two times a day (BID) | ORAL | 0 refills | Status: DC

## 2022-03-11 MED ORDER — TORSEMIDE 20 MG PO TABS: 20.0000 mg | ORAL_TABLET | Freq: Every day | ORAL | 0 refills | Status: DC

## 2022-03-11 MED ORDER — AMIODARONE HCL 200 MG PO TABS: 200.0000 mg | ORAL_TABLET | Freq: Every day | ORAL | 2 refills | Status: DC

## 2022-03-11 NOTE — Progress Notes (Signed)
Patient and family member given discharge, medication, and follow up instructions, verbalized understanding, IV and telemetry monitor removed, personal belongings with patient, family to transport home

## 2022-03-11 NOTE — TOC Transition Note (Signed)
Transition of Care [facility]) - CM/SW Discharge Note   Patient Details  Name: Joshua Hudson MRN: [id number] Date of Birth: 12/21/1934  [facility] CM/SW Contact:  Ross Ludwig, LCSW Phone Number: 03/11/2022, 10:11 AM   Clinical Narrative:     Patient discharging back home, no needs.  TOC signing off, please reconsult if other TOC needs arise.   Final next level of care: Home/Self Care Barriers to Discharge: Barriers Resolved   Patient Goals and CMS Choice Patient states their goals for this hospitalization and ongoing recovery are:: To return back home. CMS Medicare.gov Compare Post Acute Care list provided to:: Patient Choice offered to / list presented to : Patient  Discharge Placement                       Discharge Plan and Services   Discharge Planning Services: CM Consult                                 Social Determinants of Health (SDOH) Interventions     Readmission Risk Interventions    03/01/2022    1:46 PM  Readmission Risk Prevention Plan  Transportation Screening Complete  PCP or Specialist Appt within 3-5 Days Complete  HRI or [place] Complete  Social Work Consult for [place] Planning/Counseling Complete  Palliative Care Screening Not Applicable  Medication Review Press photographer) Complete

## 2022-03-11 NOTE — Progress Notes (Signed)
   03/11/22 0926  Assess: if the MEWS score is Yellow or Red  Were vital signs taken at a resting state? No  Focused Assessment No change from prior assessment  Does the patient meet 2 or more of the SIRS criteria? No  MEWS guidelines implemented *See Row Information* No, other (Comment) (spoke with MD and patient HR and resp decreased after ambulation, will continue with DC home)  Notify: Charge Nurse/RN  Name of Charge Nurse/RN Notified Abby RN  Date Charge Nurse/RN Notified 03/11/22  Time Charge Nurse/RN Notified [phone]  Notify: Provider  Provider Name/Title [place]  Date Provider Notified 03/11/22  Time Provider Notified [phone]  Method of Notification Page  Notification Reason Other (Comment) (RED mews)  Provider response Other (Comment) (BP stable, HR acceptable for DC home)  Date of Provider Response 03/11/22  Time of Provider Response [phone]

## 2022-03-11 NOTE — Plan of Care (Signed)

## 2022-03-11 NOTE — Discharge Summary (Signed)
Physician Discharge Summary  [name] SJG:[id number] DOB: [birth year] DOA: 02/28/2022  PCP: Lavone Orn, MD  Admit date: 02/28/2022 Discharge date: 03/11/2022  Time spent: 45 minutes  Recommendations for Outpatient Follow-up:  Needs downward titration of amiodarone 400 twice daily to 200 daily on 7/16 Recommend outpatient consideration for cardioversion repeat in 3 to 4 weeks as per cardiology--cardiology recommended Eliquis 2.5 twice daily on discharge-consider resumption of 5 mg dosing on follow-up if hemodynamically stable and no bleeding Resumed torsemide at 20 mg on discharge-probably would need to adjust this depending on his dizziness and hypotension Needs Chem-12 CBC in 1 week, needs thyroid function studies in about 1 month   Discharge Diagnoses:  MAIN problem for hospitalization   Acute GI bleed A-fib RVR heart control  Please see below for itemized issues addressed in HOpsital- refer to other progress notes for clarity if needed  Discharge Condition: Fair  Diet recommendation: Heart healthy  Filed Weights   03/09/22 1050 03/10/22 0500 03/11/22 0500  Weight: 48.9 kg 50.6 kg 52.3 kg    History of present illness:  87 year old black male paroxysmal A-fib CHA2DS2-VASc >4/Eliquis (diagnosed in 2021) Bioprosthetic AV valve Thrombocytopenia CKD 3B Rheumatoid arthritis Combined systolic and diastolic heart failure-EF 55-60% 02/24/2022 HTN HLD reflux Achalasia cardia   Hospitalization recently 621-02/25/2022 with melena EGD = erosive gastropathy transfused 2 units PRBC recommended to take Protonix 40 twice daily and hold Eliquis for 1 week   Represented 02/28/2022 with several further episodes of black stools Pulse rate 111 BP 121/68 BUNs/creatinine 33/2.3 (baseline 1.7-2.1) magnesium 1.9 CT abdomen pelvis without contrast = multifactorial degradation proctitis   6/29-cardiology consulted for A-fib RVR and hypotension 6/30-GI consulted-EGD few bleeding  angiectasia's in duodenum and jejunum 6/30 colonoscopy nonbleeding angiectasia internal hemorrhoids 3 mm polyp in ascending colon resected 7/5-TEE cardioversion attempt for afib, unsuccessful  Hospital Course:  Melena Status post scopes as above hemoglobin is stable Cardiology recommended 2.5 mg twice daily of Eliquis on discharge-May need reconsideration of Eliquis dosing if hemodynamically stabilizes Continue Protonix 40 twice daily Anemia of acute blood loss Status posttransfusion 1 unit, received IV iron 6/24, 7/3 Hemoglobin is stable in the 8-9 range and will need to repeat Hypotension 03/09/2022 Likely secondary to diuretics, Cardizem, Flomax Fluids were discontinued on discharge-patient will resume torsemide 20 mg daily as was no longer symptomatic with hypotension although he always feels dizzy Chronic atrial fibrillation CHADVASC >4 Prolonged QT interval Was loaded with amiodarone as had A-fib with RVR on admission and was transitioned to oral 400 twice daily-he will need to go back to a lower dose of 200 mg probably on 7/16 TEE cardioversion 7/5 unsuccessful status in A-fib--- Will need outpatient reeval in 3 months Resume higher dose Eliquis 03/11/2022 Cannot titrate BB or cardizem 2/2 hypotension--this may be his new normal in terms of symptoms HFpEF with history of aortic valve Medications adjusted as per cardiology  Rheumatoid arthritis Arava held due to thrombocytopenia patient Prednisone 5 mg BPH Continue finasteride on discharge-Flomax held AKI on admission superimposed on CKD 3B baseline 1.8 creatinine Peak creatinine was 2.8-steadily improving and on discharge was 25/1.3  Procedures: As above Consultations: Cardiology  Discharge Exam: Vitals:   03/10/22 2051 03/11/22 0527  BP: 103/77 107/61  Pulse: 71 82  Resp: 18   Temp: 98.1 F (36.7 C) 97.9 F (36.6 C)  SpO2: 98% 99%    Subj on day of d/c   Doing fair today no distress Ambulated in hallway without  any discomfort-does not seem  to dizzy No bleeding  General Exam on discharge  EOMI NCAT no focal deficit no icterus  Currently is in sinus tachycardia-did converte from A-fib overnight Abdomen is soft no rebound no guarding ROM intact Neurologically intact no focal deficit  Discharge Instructions   Discharge Instructions     Diet - low sodium heart healthy   Complete by: As directed    Discharge instructions   Complete by: As directed    85We would recommend that you continue to use your amiodarone at the current dose of 400 mg back on the 16th to 200 mg daily We have resumed your torsemide 20 mg-I would use this daily on discharge and this will need to be adjusted in the outpatient setting with your cardiologist who should be seeing you to consider further interventions and may be another cardioversion Continue your blood thinner notify the emergency room or your primary physician if you have any bleeding whatsoever   Increase activity slowly   Complete by: As directed    No wound care   Complete by: As directed       Allergies as of 03/11/2022   No Known Allergies      Medication List     STOP taking these medications    diphenoxylate-atropine 2.5-0.025 MG tablet Commonly known as: LOMOTIL   folic acid 1 MG tablet Commonly known as: FOLVITE   leflunomide 20 MG tablet Commonly known as: ARAVA   tamsulosin 0.4 MG Caps capsule Commonly known as: FLOMAX   traMADol 50 MG tablet Commonly known as: ULTRAM       TAKE these medications    amiodarone 400 MG tablet Commonly known as: PACERONE Take 1 tablet (400 mg total) by mouth 2 (two) times daily for 7 days. What changed:  medication strength how much to take how to take this when to take this additional instructions   amiodarone 200 MG tablet Commonly known as: Pacerone Take 1 tablet (200 mg total) by mouth daily. Start taking on: March 19, 2022 What changed: You were already taking a medication  with the same name, and this prescription was added. Make sure you understand how and when to take each.   apixaban 2.5 MG Tabs tablet Commonly known as: Eliquis Take 1 tablet (2.5 mg total) by mouth 2 (two) times daily.   finasteride 5 MG tablet Commonly known as: PROSCAR Take 5 mg by mouth daily as needed (retention).   ketotifen 0.025 % ophthalmic solution Commonly known as: ZADITOR Place 1 drop into both eyes 2 (two) times daily as needed (itchy eyes).   levothyroxine 88 MCG tablet Commonly known as: SYNTHROID Take 88 mcg by mouth every morning.   multivitamin with minerals Tabs tablet Take 1 tablet by mouth daily.   pantoprazole 40 MG tablet Commonly known as: PROTONIX Take 1 tablet (40 mg total) by mouth 2 (two) times daily before a meal for 30 days, THEN 1 tablet (40 mg total) daily. Start taking on: February 25, 2022 What changed: Another medication with the same name was removed. Continue taking this medication, and follow the directions you see here.   potassium chloride SA 20 MEQ tablet Commonly known as: KLOR-CON M Take 1 tablet (20 mEq total) by mouth daily. NEED OV. What changed: additional instructions   predniSONE 5 MG tablet Commonly known as: DELTASONE Take 5 mg by mouth daily with breakfast.   Retacrit 40000 UNIT/ML injection Generic drug: epoetin alfa-epbx Inject 40,000 Units into the skin every 6 (six) weeks.  torsemide 20 MG tablet Commonly known as: DEMADEX Take 1 tablet (20 mg total) by mouth daily. Start taking on: March 12, 2022 What changed: how much to take       No Known Allergies    The results of significant diagnostics from this hospitalization (including imaging, microbiology, ancillary and laboratory) are listed below for reference.    Significant Diagnostic Studies: ECHO TEE  Result Date: 03/08/2022    TRANSESOPHOGEAL ECHO REPORT   Patient Name:   Joshua Hudson Date of Exam: 03/08/2022 Medical Rec #:  [id number]       Height:        63.0 in Accession #:    [id number]      Weight:       105.8 lb Date of Birth:  06-02-1935       BSA:          1.475 m Patient Age:    44 years        BP:           109/61 mmHg Patient Gender: M               HR:           82 bpm. Exam Location:  Inpatient Procedure: Transesophageal Echo, Color Doppler, Cardiac Doppler and 3D Echo Indications:     I48.91* Unspeicified atrial fibrillation  History:         Patient has prior history of Echocardiogram examinations, most                  recent 02/24/2022. CHF, Arrythmias:Atrial Fibrillation; Risk                  Factors:Hypertension and GERD.                  Aortic Valve: 26 mm Sapien prosthetic, stented (TAVR) valve is                  present in the aortic position. Procedure Date: 04/15/19.  Sonographer:     Bernadene Person RDCS Referring Phys:  [id number] Greer Ee PEMBERTON Diagnosing Phys: Gwyndolyn Kaufman MD PROCEDURE: After discussion of the risks and benefits of a TEE, an informed consent was obtained from the patient. The transesophogeal probe was passed without difficulty through the esophogus of the patient. Sedation performed by different physician. The patient was monitored while under deep sedation. Anesthestetic sedation was provided intravenously by Anesthesiology: 111.'2mg'$  of Propofol, '40mg'$  of Lidocaine. The patient's vital signs; including heart rate, blood pressure, and oxygen saturation; remained stable throughout the procedure. The patient developed no complications during the procedure. IMPRESSIONS  1. Left ventricular ejection fraction, by estimation, is 50 to 55%. The left ventricle has low normal function.  2. Right ventricular systolic function was not well visualized. The right ventricular size is mildly enlarged.  3. Left atrial size was moderately dilated. No left atrial/left atrial appendage thrombus was detected. The LAA emptying velocity was 20 cm/s.  4. Right atrial size was mildly dilated.  5. The mitral valve is grossly normal. Mild  mitral valve regurgitation.  6. Tricuspid valve regurgitation is mild to moderate.  7. The aortic valve has been repaired/replaced. Patient had initial surgical bioprosthetic prosthesis followed by a valve-in-valve TAVR with a 26 mm Edwards Sapien prosthetic (TAVR) valve. Procedure Date: 04/15/19. Echo findings are consistent with normal structure and function of the aortic valve prosthesis. Trivial perivalvular leak. Gastric views not obtained due to difficulty passing echo probe.  Visually, there is normal leaflet motion with no evidence of aortic stenosis.  8. Following TEE, the patient underwent DCCV with 150J, 200J with initial return to sinus bradycardia. FINDINGS  Left Ventricle: Left ventricular ejection fraction, by estimation, is 50 to 55%. The left ventricle has low normal function. The left ventricular internal cavity size was normal in size. Right Ventricle: The right ventricular size is mildly enlarged. No increase in right ventricular wall thickness. Right ventricular systolic function was not well visualized. Left Atrium: Left atrial size was moderately dilated. No left atrial/left atrial appendage thrombus was detected. The LAA emptying velocity was 20 cm/s. Right Atrium: Right atrial size was mildly dilated. Pericardium: There is no evidence of pericardial effusion. Mitral Valve: The mitral valve is grossly normal. There is mild thickening of the mitral valve leaflet(s). There is mild calcification of the mitral valve leaflet(s). Mild mitral valve regurgitation. Tricuspid Valve: The tricuspid valve is normal in structure. Tricuspid valve regurgitation is mild to moderate. Aortic Valve: The aortic valve has been repaired/replaced. Aortic valve regurgitation is not visualized. Patient is s/p valve-in-valve TAVR with a 26 mm Edwards Sapien prosthetic, stented (TAVR) valve. Procedure Date: 04/15/19. Echo findings are consistent with normal structure and function of the aortic valve prosthesis. Pulmonic  Valve: The pulmonic valve was normal in structure. Pulmonic valve regurgitation is trivial. Aorta: The aortic root is normal in size and structure. IAS/Shunts: There is right bowing of the interatrial septum, suggestive of elevated left atrial pressure. The atrial septum is grossly normal. Gwyndolyn Kaufman MD Electronically signed by Gwyndolyn Kaufman MD Signature Date/Time: 03/08/2022/3:44:34 PM    Final    DG Abd 2 Views  Result Date: 03/02/2022 CLINICAL DATA:  Abdominal pain, history of rectal bleeding EXAM: ABDOMEN - 2 VIEW COMPARISON:  None Available. FINDINGS: Bowel gas pattern is nonspecific. No abnormal masses or calcifications are seen. There is prosthetic cardiac valve. There is right shoulder arthroplasty. Surgical fusion is seen in the lumbar spine. IMPRESSION: Nonspecific bowel gas pattern. Electronically Signed   By: Elmer Picker M.D.   On: 03/02/2022 10:31   DG Chest Portable 1 View  Result Date: 02/28/2022 CLINICAL DATA:  Shortness of breath EXAM: PORTABLE CHEST 1 VIEW COMPARISON:  09/22/2019 FINDINGS: Right shoulder replacement. Post sternotomy changes and valve prosthesis. Mild cardiomegaly with aortic atherosclerosis. No edema or pleural effusion. IMPRESSION: No active disease. Mild cardiomegaly without edema or acute airspace disease Electronically Signed   By: Donavan Foil M.D.   On: 02/28/2022 18:46   CT ABDOMEN PELVIS WO CONTRAST  Result Date: 02/28/2022 CLINICAL DATA:  Recently discharged after having bloody diarrhea. Recurrent today. EXAM: CT ABDOMEN AND PELVIS WITHOUT CONTRAST TECHNIQUE: Multidetector CT imaging of the abdomen and pelvis was performed following the standard protocol without IV contrast. RADIATION DOSE REDUCTION: This exam was performed according to the departmental dose-optimization program which includes automated exposure control, adjustment of the mA and/or kV according to patient size and/or use of iterative reconstruction technique. COMPARISON:   06/13/2019 abdominopelvic CT from [facility]. FINDINGS: Lower chest: Bibasilar scarring. Emphysema. Status post TAVR. Cardiomegaly. Hepatobiliary: Artifact degradation secondary to EKG wires and leads throughout. Subcentimeter right hepatic lobe cyst. Normal gallbladder, without biliary ductal dilatation. Pancreas: Pancreatic atrophy. No duct dilatation or acute inflammation. Spleen: Normal in size, without focal abnormality. Adrenals/Urinary Tract: Normal adrenal glands. No renal calculi or hydronephrosis. Mild renal cortical thinning bilaterally. Fluid density exophytic right renal lesions of maximally 2.8 cm are likely cysts . In the absence of clinically indicated  signs/symptoms require(s) no independent follow-up. The ureters are difficult to follow secondary to beam hardening artifact from lumbar spine hardware. The bladder is decompressed, without stone. Stomach/Bowel: Normal stomach, without wall thickening. Transverse duodenal diverticulum. Otherwise normal small bowel. Bowel is suboptimally evaluated secondary to lack of IV and paucity of enteric contrast. Suspect mild rectal wall thickening on 66/2. Normal terminal ileum. Appendix not visualized. Vascular/Lymphatic: Aortic atherosclerosis. No abdominopelvic adenopathy. Reproductive: Normal prostate. Other: No significant free fluid.  No free intraperitoneal air. Musculoskeletal: Defect in the left iliac is likely bone harvest site given lumbar spine fixation. Trans pedicle screw fixation at L3-5 with interbody fusion material at L4-5. Degenerate disc disease at the remainder of the lumbosacral spine with convex left lumbar spine curvature. IMPRESSION: 1. Multifactorial degradation, including lack of IV contrast, little if any enteric contrast, and support apparatus artifact. 2. Suspect proctitis, with distribution favoring infection. Not a typical distribution for ischemic bowel despite the clinical history of bloody diarrhea. 3.  Aortic  Atherosclerosis (ICD10-I70.0). Electronically Signed   By: Abigail Miyamoto M.D.   On: 02/28/2022 17:59   ECHOCARDIOGRAM COMPLETE  Result Date: 02/24/2022    ECHOCARDIOGRAM REPORT   Patient Name:   ELIU BATCH Date of Exam: 02/24/2022 Medical Rec #:  [id number]       Height:       63.0 in Accession #:    [id number]      Weight:       108.3 lb Date of Birth:  12/16/1934       BSA:          1.490 m Patient Age:    21 years        BP:           108/60 mmHg Patient Gender: M               HR:           107 bpm. Exam Location:  Inpatient Procedure: 2D Echo, Cardiac Doppler and Color Doppler Indications:    Edema  History:        Patient has prior history of Echocardiogram examinations. CHF,                 Arrythmias:Atrial Fibrillation; Risk Factors:Hypertension.  Sonographer:    Jyl Heinz Referring Phys: [id number] TAYE T GONFA IMPRESSIONS  1. Left ventricular ejection fraction, by estimation, is 55 to 60%. The left ventricle has normal function. The left ventricle has no regional wall motion abnormalities. Left ventricular diastolic function could not be evaluated.  2. Right ventricular systolic function is moderately reduced. The right ventricular size is moderately enlarged.  3. Left atrial size was severely dilated.  4. Right atrial size was mildly dilated.  5. The mitral valve is normal in structure. Mild mitral valve regurgitation.  6. Tricuspid valve regurgitation is moderate.  7. The aortic valve has been repaired/replaced. Aortic valve regurgitation is not visualized. No aortic stenosis is present. FINDINGS  Left Ventricle: Left ventricular ejection fraction, by estimation, is 55 to 60%. The left ventricle has normal function. The left ventricle has no regional wall motion abnormalities. The left ventricular internal cavity size was normal in size. There is  no left ventricular hypertrophy. Left ventricular diastolic function could not be evaluated due to atrial fibrillation. Left ventricular diastolic  function could not be evaluated. Right Ventricle: The right ventricular size is moderately enlarged. Right vetricular wall thickness was not well visualized. Right ventricular systolic function is moderately reduced. Left Atrium:  Left atrial size was severely dilated. Right Atrium: Right atrial size was mildly dilated. Pericardium: There is no evidence of pericardial effusion. Mitral Valve: The mitral valve is normal in structure. Mild mitral valve regurgitation. Tricuspid Valve: The tricuspid valve is grossly normal. Tricuspid valve regurgitation is moderate. Aortic Valve: The aortic valve has been repaired/replaced. Aortic valve regurgitation is not visualized. No aortic stenosis is present. Aortic valve mean gradient measures 6.5 mmHg. Aortic valve peak gradient measures 10.8 mmHg. Aortic valve area, by VTI  measures 1.10 cm. There is a 26 mm Edwards Sapien prosthetic, stented (TAVR) valve present in the aortic position. Pulmonic Valve: The pulmonic valve was normal in structure. Pulmonic valve regurgitation is trivial. Aorta: The aortic root and ascending aorta are structurally normal, with no evidence of dilitation. IAS/Shunts: The atrial septum is grossly normal.  LEFT VENTRICLE PLAX 2D LVIDd:         4.70 cm     Diastology LVIDs:         3.40 cm     LV e' medial:    7.40 cm/s LV PW:         1.00 cm     LV E/e' medial:  13.6 LV IVS:        0.90 cm     LV e' lateral:   11.20 cm/s LVOT diam:     2.00 cm     LV E/e' lateral: 9.0 LV SV:         33 LV SV Index:   22 LVOT Area:     3.14 cm  LV Volumes (MOD) LV vol d, MOD A2C: 62.5 ml LV vol d, MOD A4C: 60.4 ml LV vol s, MOD A2C: 27.0 ml LV vol s, MOD A4C: 26.1 ml LV SV MOD A2C:     35.5 ml LV SV MOD A4C:     60.4 ml LV SV MOD BP:      34.5 ml RIGHT VENTRICLE            IVC RV Basal diam:  4.00 cm    IVC diam: 1.70 cm RV Mid diam:    2.70 cm RV S prime:     7.29 cm/s TAPSE (M-mode): 1.2 cm LEFT ATRIUM             Index        RIGHT ATRIUM           Index LA diam:         4.20 cm 2.82 cm/m   RA Area:     20.70 cm LA Vol (A2C):   69.6 ml 46.71 ml/m  RA Volume:   60.70 ml  40.74 ml/m LA Vol (A4C):   64.9 ml 43.56 ml/m LA Biplane Vol: 70.9 ml 47.59 ml/m  AORTIC VALVE AV Area (Vmax):    1.10 cm AV Area (Vmean):   1.05 cm AV Area (VTI):     1.10 cm AV Vmax:           164.00 cm/s AV Vmean:          119.000 cm/s AV VTI:            0.300 m AV Peak Grad:      10.8 mmHg AV Mean Grad:      6.5 mmHg LVOT Vmax:         57.30 cm/s LVOT Vmean:        39.850 cm/s LVOT VTI:          0.106 m  LVOT/AV VTI ratio: 0.35  AORTA Ao Root diam: 3.00 cm Ao Asc diam:  3.30 cm MITRAL VALVE                TRICUSPID VALVE MV Area (PHT): 5.20 cm     TR Peak grad:   24.2 mmHg MV Decel Time: 146 msec     TR Vmax:        246.00 cm/s MR Peak grad: 75.0 mmHg MR Vmax:      433.00 cm/s   SHUNTS MV E velocity: 101.00 cm/s  Systemic VTI:  0.11 m MV A velocity: 43.30 cm/s   Systemic Diam: 2.00 cm MV E/A ratio:  2.33 Mertie Moores MD Electronically signed by Mertie Moores MD Signature Date/Time: 02/24/2022/2:07:01 PM    Final     Microbiology: No results found for this or any previous visit (from the past 240 hour(s)).   Labs: Basic Metabolic Panel: Recent Labs  Lab 03/07/22 0747 03/08/22 0504 03/09/22 0520 03/10/22 0556 03/11/22 0611  NA 143 141 141 142 144  K 3.5 3.9 3.4* 3.7 3.8  CL 109 106 107 110 113*  CO2 '27 26 26 25 25  '$ GLUCOSE 77 76 76 83 86  BUN 30* 38* 36* 34* 25*  CREATININE 1.56* 1.83* 1.75* 1.44* 1.35*  CALCIUM 8.6* 8.8* 8.5* 8.5* 8.3*  MG  --   --  1.8 2.0  --    Liver Function Tests: No results for input(s): "AST", "ALT", "ALKPHOS", "BILITOT", "PROT", "ALBUMIN" in the last 168 hours. No results for input(s): "LIPASE", "AMYLASE" in the last 168 hours. No results for input(s): "AMMONIA" in the last 168 hours. CBC: Recent Labs  Lab 03/04/22 1256 03/05/22 0815 03/06/22 0751 03/07/22 0747 03/08/22 0636  WBC 10.7* 7.5 8.0 6.9 6.2  HGB 9.1* 9.0* 9.1* 8.7* 8.5*  HCT  28.8* 28.3* 29.4* 27.2* 28.0*  MCV 77.6* 76.7* 78.8* 76.8* 79.1*  PLT 117* 113* 114* 113* 148*   Cardiac Enzymes: No results for input(s): "CKTOTAL", "CKMB", "CKMBINDEX", "TROPONINI" in the last 168 hours. BNP: BNP (last 3 results) Recent Labs    02/25/22 0454 02/28/22 1650  BNP 351.9* 256.0*    ProBNP (last 3 results) No results for input(s): "PROBNP" in the last 8760 hours.  CBG: No results for input(s): "GLUCAP" in the last 168 hours.     Signed:  Nita Sells MD   [facility] 03/11/2022, 9:05 AM

## 2022-03-13 ENCOUNTER — Inpatient Hospital Stay: Payer: Medicare PPO

## 2022-03-13 NOTE — Telephone Encounter (Signed)
**Note De-Identified  Obfuscation** Patient and his daughter Joshua Hudson contacted regarding the pts discharge from [facility] on 03/11/2022. The pt gave verbal permission for me to s/w his daughter, Joshua Hudson.  Joshua Hudson understands that the pt has a hospital follow up scheduled with provider Nicholes Rough, PA-c on 03/21/2022 at 10:15 at [address] [place] in [place], [place] [address]. Joshua Hudson understands the patient's discharge instructions? Yes Joshua Hudson understands the pts medications and regiment? Yes Joshua Hudson understands to bring all of the pts medications to this visit? Yes  Ask patient:  Are you enrolled in My Chart: Yes  Joshua Hudson states that the pt denies having any CP/discomfort but that he does continue to have symptoms of dizziness, lightheadedness, unstable gate, weakness, and fatigue that is no worse than while he was in the hospital and at his d/c on 7/8 due to his atrial fibrillation. She is there with the pt and is aware to assist him in standing up slowly, with walking, and that he should not make sudden position changes while standing. She reports that he is taking his medications as directed.  Joshua Hudson verified that they do have [facility]'s phone number and is aware to call us if they have any questions or concerns. She thanked me for my call.

## 2022-03-13 NOTE — Telephone Encounter (Signed)
Pt is returning call.  

## 2022-03-13 NOTE — Telephone Encounter (Signed)
**Note De-Identified  Obfuscation** Transition Care Management Unsuccessful Follow-up Telephone Call  Date of discharge and from where:  03/11/2022 from [facility]  Attempts:  1st Attempt  Reason for unsuccessful TCM follow-up call:  Left voice message on the pts home and cell phones asking him to call Jeani Hawking at Dr Theodosia Blender office at [facility] at [phone].

## 2022-03-14 DIAGNOSIS — K922 Gastrointestinal hemorrhage, unspecified: Secondary | ICD-10-CM | POA: Diagnosis not present

## 2022-03-14 DIAGNOSIS — I482 Chronic atrial fibrillation, unspecified: Secondary | ICD-10-CM | POA: Diagnosis not present

## 2022-03-14 DIAGNOSIS — D649 Anemia, unspecified: Secondary | ICD-10-CM | POA: Diagnosis not present

## 2022-03-14 DIAGNOSIS — I5040 Unspecified combined systolic (congestive) and diastolic (congestive) heart failure: Secondary | ICD-10-CM | POA: Diagnosis not present

## 2022-03-14 DIAGNOSIS — E43 Unspecified severe protein-calorie malnutrition: Secondary | ICD-10-CM | POA: Diagnosis not present

## 2022-03-14 DIAGNOSIS — D469 Myelodysplastic syndrome, unspecified: Secondary | ICD-10-CM | POA: Diagnosis not present

## 2022-03-14 DIAGNOSIS — R197 Diarrhea, unspecified: Secondary | ICD-10-CM | POA: Diagnosis not present

## 2022-03-16 ENCOUNTER — Other Ambulatory Visit: Payer: Self-pay | Admitting: *Deleted

## 2022-03-16 DIAGNOSIS — H353211 Exudative age-related macular degeneration, right eye, with active choroidal neovascularization: Secondary | ICD-10-CM | POA: Diagnosis not present

## 2022-03-16 DIAGNOSIS — D638 Anemia in other chronic diseases classified elsewhere: Secondary | ICD-10-CM

## 2022-03-17 DIAGNOSIS — N179 Acute kidney failure, unspecified: Secondary | ICD-10-CM | POA: Diagnosis not present

## 2022-03-20 ENCOUNTER — Other Ambulatory Visit: Payer: Self-pay

## 2022-03-20 ENCOUNTER — Inpatient Hospital Stay: Payer: Medicare PPO

## 2022-03-20 ENCOUNTER — Inpatient Hospital Stay: Payer: Medicare PPO | Attending: Hematology and Oncology

## 2022-03-20 ENCOUNTER — Inpatient Hospital Stay: Payer: Medicare PPO | Admitting: Hematology and Oncology

## 2022-03-20 VITALS — BP 88/67 | HR 71 | Temp 97.5°F | Resp 17 | Ht 65.0 in | Wt 105.3 lb

## 2022-03-20 DIAGNOSIS — Z79899 Other long term (current) drug therapy: Secondary | ICD-10-CM | POA: Insufficient documentation

## 2022-03-20 DIAGNOSIS — N1832 Chronic kidney disease, stage 3b: Secondary | ICD-10-CM | POA: Insufficient documentation

## 2022-03-20 DIAGNOSIS — D696 Thrombocytopenia, unspecified: Secondary | ICD-10-CM | POA: Diagnosis not present

## 2022-03-20 DIAGNOSIS — D638 Anemia in other chronic diseases classified elsewhere: Secondary | ICD-10-CM

## 2022-03-20 DIAGNOSIS — Z7901 Long term (current) use of anticoagulants: Secondary | ICD-10-CM | POA: Diagnosis not present

## 2022-03-20 DIAGNOSIS — I959 Hypotension, unspecified: Secondary | ICD-10-CM | POA: Diagnosis not present

## 2022-03-20 DIAGNOSIS — D631 Anemia in chronic kidney disease: Secondary | ICD-10-CM | POA: Insufficient documentation

## 2022-03-20 DIAGNOSIS — Z79890 Hormone replacement therapy (postmenopausal): Secondary | ICD-10-CM | POA: Diagnosis not present

## 2022-03-20 DIAGNOSIS — D563 Thalassemia minor: Secondary | ICD-10-CM | POA: Diagnosis not present

## 2022-03-20 DIAGNOSIS — R531 Weakness: Secondary | ICD-10-CM | POA: Diagnosis not present

## 2022-03-20 LAB — CBC WITH DIFFERENTIAL (CANCER CENTER ONLY)
Abs Immature Granulocytes: 0.05 10*3/uL (ref 0.00–0.07)
Basophils Absolute: 0 10*3/uL (ref 0.0–0.1)
Basophils Relative: 0 %
Eosinophils Absolute: 0.2 10*3/uL (ref 0.0–0.5)
Eosinophils Relative: 2 %
HCT: 22 % — ABNORMAL LOW (ref 39.0–52.0)
Hemoglobin: 7.1 g/dL — ABNORMAL LOW (ref 13.0–17.0)
Immature Granulocytes: 1 %
Lymphocytes Relative: 4 %
Lymphs Abs: 0.3 10*3/uL — ABNORMAL LOW (ref 0.7–4.0)
MCH: 23.8 pg — ABNORMAL LOW (ref 26.0–34.0)
MCHC: 32.3 g/dL (ref 30.0–36.0)
MCV: 73.8 fL — ABNORMAL LOW (ref 80.0–100.0)
Monocytes Absolute: 0.5 10*3/uL (ref 0.1–1.0)
Monocytes Relative: 7 %
Neutro Abs: 6.7 10*3/uL (ref 1.7–7.7)
Neutrophils Relative %: 86 %
Platelet Count: 127 10*3/uL — ABNORMAL LOW (ref 150–400)
RBC: 2.98 MIL/uL — ABNORMAL LOW (ref 4.22–5.81)
RDW: 22.8 % — ABNORMAL HIGH (ref 11.5–15.5)
WBC Count: 7.8 10*3/uL (ref 4.0–10.5)
nRBC: 0.8 % — ABNORMAL HIGH (ref 0.0–0.2)

## 2022-03-20 LAB — FERRITIN: Ferritin: 241 ng/mL (ref 24–336)

## 2022-03-20 LAB — CMP (CANCER CENTER ONLY)
ALT: 21 U/L (ref 0–44)
AST: 23 U/L (ref 15–41)
Albumin: 3.5 g/dL (ref 3.5–5.0)
Alkaline Phosphatase: 49 U/L (ref 38–126)
Anion gap: 9 (ref 5–15)
BUN: 36 mg/dL — ABNORMAL HIGH (ref 8–23)
CO2: 26 mmol/L (ref 22–32)
Calcium: 8.6 mg/dL — ABNORMAL LOW (ref 8.9–10.3)
Chloride: 107 mmol/L (ref 98–111)
Creatinine: 2.33 mg/dL — ABNORMAL HIGH (ref 0.61–1.24)
GFR, Estimated: 27 mL/min — ABNORMAL LOW (ref >60)
Glucose, Bld: 95 mg/dL (ref 70–99)
Potassium: 3 mmol/L — ABNORMAL LOW (ref 3.5–5.1)
Sodium: 142 mmol/L (ref 135–145)
Total Bilirubin: 0.6 mg/dL (ref 0.3–1.2)
Total Protein: 5.4 g/dL — ABNORMAL LOW (ref 6.5–8.1)

## 2022-03-20 LAB — IRON AND IRON BINDING CAPACITY (CC-WL,HP ONLY)
Iron: 79 ug/dL (ref 45–182)
Saturation Ratios: 39 % (ref 17.9–39.5)
TIBC: 204 ug/dL — ABNORMAL LOW (ref 250–450)
UIBC: 125 ug/dL (ref 117–376)

## 2022-03-20 LAB — SAMPLE TO BLOOD BANK

## 2022-03-20 LAB — PREPARE RBC (CROSSMATCH)

## 2022-03-20 MED ORDER — SODIUM CHLORIDE 0.9% IV SOLUTION
250.0000 mL | Freq: Once | INTRAVENOUS | Status: AC
  Administered 2022-03-20: 250 mL via INTRAVENOUS

## 2022-03-20 NOTE — Progress Notes (Unsigned)
Office Visit    Patient Name: Joshua Hudson Date of Encounter: 03/21/2022  PCP:  Lavone Orn, [facility]  Cardiologist:  [name], MD  Advanced Practice Provider:  No care team member to display Electrophysiologist:  None   Chief Complaint    Joshua Hudson is a 87 y.o. male with a hx of hypertension and atrial flutter who underwent EPS/RFA of typical atrial flutter but then developed LA flutter, AS s/p TAVR, anemia of chronic disease and tobacco abuse presents today for follow-up hospital appointment.  He was last seen in the office 10/03/2021 by Dr. Lovena Le.  He had been placed on amiodarone.  He felt okay at rest but gets fatigued and short of breath with exertion.  He did not have any palpitations but had not missed any of his medications.  His amiodarone dose was reduced to 200 mg daily.  Recently hospitalized for acute GI bleed and atrial fibrillation with RVR.  Outpatient recommendations included consideration for cardioversion.  He was discharged on Eliquis 2.5 mg twice a day.  Considering resumption of 5 mg twice daily dosing once hemodynamically stable and if no bleeding.  He was seen by oncology 03/20/2022 and was still feeling extremely weak and tired.  Blood pressure was low.  His hemoglobin on 03/20/2022 was 7.1 with platelets of 127 K.  2 units of PRBCs were ordered.  Of note, he seems to get blood transfusions about every 6 weeks.  Notes imply that cardioversion is scheduled for 03/22/2022.   Today, he feels extremely fatigued.  He has been unable to eat much or drink much.  He also has dizziness and lightheadedness.  His family member states that he falls 2-3 times a week.  This is dangerous considering he is on Eliquis 2.5 mg twice daily.  Unfortunately, he remains in atrial fibrillation today but he is rate controlled.  He was given 2 units of PRBCs yesterday and still feels bad today.  He was recently in the hospital for a GI bleed and had  some spots cauterized and a clip placed.  We discussed scheduling him for a cardioversion and then we will make a follow-up appointment with Dr. Lovena Le to discuss next steps.  He recently had lab work done yesterday.  We discussed increasing his fluid intake and p.o. intake to help his dizziness/lightheadedness.  He does not tolerate lower extremity compression however, he does have 2+ pitting edema today.  Recent creatinine was 2.3.  He has repeat labs scheduled with his primary in 3 weeks.  His Demadex was recently decreased due to his poor kidney function.  TSH was recently checked and it was normal.  Blood pressure is 98/62 which is better than it usually is.  Reports no chest pain, pressure, or tightness.  Reports no palpitations.    Past Medical History    Past Medical History:  Diagnosis Date   Achalasia 06/12/2019   Noted on EGD   Aortic insufficiency 03/24/2019   AI of bioprosthetic AVR severe 4 plus   Chronic diastolic CHF (congestive heart failure) ([place]) 03/24/2019   Colon polyps    s/p diverticular perforation requiring 2-stage repair   Derangement of right shoulder joint    need replacing has no use of   Diverticulosis    DJD (degenerative joint disease), lumbosacral    Dysphagia    eats soft food   Esophageal stricture    GERD (gastroesophageal reflux disease)    Hemolytic anemia (  [place])    History of blood transfusion given with 04-15-19 surgery   History of blood transfusion 07/18/2019   History of kidney stones    passed stones   Hypertension    Mitral regurgitation    moderate   Occipital neuralgia    Pancytopenia (HCC)    Postoperative atrial fibrillation ([place]) 05/10/2015   Prosthetic valve dysfunction    Rheumatoid arthritis ([place])    s/o long term steroids  shoulders and hands   Rotator cuff arthropathy    right   S/P aortic valve replacement with bioprosthetic valve 01/16/2008   53m Edwards Magna perimount bovine pericardial tissue valve, model 3000   S/P  valve-in-valve TAVR 04/15/2019   26 mm Edwards Sapien 3 Ultra transcatheter heart valve placed via percutaneous right transfemoral approach    SBE (subacute bacterial endocarditis) prophylaxis candidate    for dental procedures   Schatzki's ring 06/12/2019   Narrowing, Noted on EGD   Severe aortic stenosis    S/P prosthetic valve replacement w 25 mm Edwards like science percardial tissue valve,[place] - 01/2008   [place]rombocytopenia ([place]    Past Surgical History:  Procedure Laterality Date   A-FLUTTER ABLATION N/A 10/10/2019   Procedure: A-FLUTTER ABLATION;  Surgeon: TEvans Lance MD;  [facility];  Service: Cardiovascular;  Laterality: N/A;   APPENDECTOMY     BALLOON DILATION N/A 06/12/2019   Procedure: BALLOON DILATION;  Surgeon: KRonnette Juniper MD;  [facility];  Service: Gastroenterology;  Laterality: N/A;   BOTOX INJECTION N/A 01/22/2018   Procedure: BOTOX INJECTION;  Surgeon: KRonnette Juniper MD;  [facility];  Service: Gastroenterology;  Laterality: N/A;   CARDIAC CATHETERIZATION     09   CARDIAC VALVE REPLACEMENT  01/2008   aortic valve replacement   CARDIOVERSION N/A 09/25/2019   Procedure: CARDIOVERSION;  Surgeon: SJerline Pain MD;  [facility]  Service: Cardiovascular;  Laterality: N/A;   CARDIOVERSION N/A 12/25/2019   Procedure: CARDIOVERSION;  Surgeon: CLelon Perla MD;  [facility];  Service: Cardiovascular;  Laterality: N/A;   CARDIOVERSION N/A 03/08/2022   Procedure: CARDIOVERSION;  Surgeon: PFreada Bergeron MD;  [facility];  Service: Cardiovascular;  Laterality: N/A;   CATARACT EXTRACTION W/ INTRAOCULAR LENS  IMPLANT, BILATERAL     COLON RESECTION     diverticulitis    COLONOSCOPY N/A 03/03/2022   Procedure: COLONOSCOPY;  Surgeon: BOtis Brace MD;  [facility];  Service: Gastroenterology;  Laterality: N/A;   CORONARY ANGIOPLASTY     dental implants     permanent   ENTEROSCOPY N/A  03/03/2022   Procedure: ENTEROSCOPY;  Surgeon: BOtis Brace MD;  [facility];  Service: Gastroenterology;  Laterality: N/A;   ESOPHAGEAL MANOMETRY N/A 11/07/2017   Procedure: ESOPHAGEAL MANOMETRY (EM);  Surgeon: KRonnette Juniper MD;  [facility];  Service: Gastroenterology;  Laterality: N/A;   ESOPHAGOGASTRODUODENOSCOPY N/A 02/24/2022   Procedure: ESOPHAGOGASTRODUODENOSCOPY (EGD);  Surgeon: OArta Silence MD;  [facility];  Service: Gastroenterology;  Laterality: N/A;   ESOPHAGOGASTRODUODENOSCOPY (EGD) WITH PROPOFOL N/A 01/22/2018   Procedure: ESOPHAGOGASTRODUODENOSCOPY (EGD) WITH PROPOFOL;  Surgeon: KRonnette Juniper MD;  [facility];  Service: Gastroenterology;  Laterality: N/A;   ESOPHAGOGASTRODUODENOSCOPY (EGD) WITH PROPOFOL N/A 04/30/2019   Procedure: ESOPHAGOGASTRODUODENOSCOPY (EGD) WITH PROPOFOL;  Surgeon: ELaurence Spates MD;  [facility]  Service: Endoscopy;  Laterality: N/A;   ESOPHAGOGASTRODUODENOSCOPY (EGD) WITH PROPOFOL N/A 06/12/2019   Procedure: ESOPHAGOGASTRODUODENOSCOPY (EGD) WITH PROPOFOL;  Surgeon: KRonnette Juniper MD;  [facility];  Service: Gastroenterology;  Laterality: N/A;  with botox injection   EXCISIONAL TOTAL SHOULDER ARTHROPLASTY WITH ANTIBIOTIC SPACER Right 12/31/2018   Procedure: EXCISIONAL TOTAL SHOULDER ARTHROPLASTY WITH ANTIBIOTIC SPACER;  Surgeon: Netta Cedars, MD;  Location: [place];  Service: Orthopedics;  Laterality: Right;   GIVENS CAPSULE STUDY N/A 03/01/2022   Procedure: GIVENS CAPSULE STUDY;  Surgeon: Otis Brace, MD;  [facility];  Service: Gastroenterology;  Laterality: N/A;   HEMOSTASIS CLIP PLACEMENT  03/03/2022   Procedure: HEMOSTASIS CLIP PLACEMENT;  Surgeon: Otis Brace, MD;  [facility];  Service: Gastroenterology;;   HERNIA REPAIR     HOT HEMOSTASIS N/A 03/03/2022   Procedure: HOT HEMOSTASIS (ARGON PLASMA COAGULATION/BICAP);  Surgeon: Otis Brace, MD;   [facility];  Service: Gastroenterology;  Laterality: N/A;  EGD and COLON   INTRAOPERATIVE TRANSTHORACIC ECHOCARDIOGRAM N/A 04/15/2019   Procedure: Intraoperative Transthoracic Echocardiogram;  Surgeon: Sherren Mocha, MD;  Location: [place];  Service: Open Heart Surgery;  Laterality: N/A;   IRRIGATION AND DEBRIDEMENT SHOULDER Right 11/20/2018    IRRIGATION AND DEBRIDEMENT SHOULDER WITH POLY EXCHANGE (Right Shoulder)   IRRIGATION AND DEBRIDEMENT SHOULDER Right 11/20/2018   Procedure: IRRIGATION AND DEBRIDEMENT SHOULDER WITH POLY EXCHANGE;  Surgeon: Netta Cedars, MD;  Location: [place];  Service: Orthopedics;  Laterality: Right;   LUMBAR LAMINECTOMY     x 2   POLYPECTOMY  03/03/2022   Procedure: POLYPECTOMY;  Surgeon: Otis Brace, MD;  [facility];  Service: Gastroenterology;;   REVERSE SHOULDER ARTHROPLASTY Right 03/01/2018   Procedure: RIGHT REVERSE SHOULDER ARTHROPLASTY;  Surgeon: Netta Cedars, MD;  Location: [place];  Service: Orthopedics;  Laterality: Right;   REVERSE SHOULDER ARTHROPLASTY Right 07/18/2019   Procedure: REVERSE TOTAL SHOULDER ARTHROPLASTY and removal of antiobotic spacer;  Surgeon: Netta Cedars, MD;  [facility];  Service: Orthopedics;  Laterality: Right;  interscalene block   REVERSE SHOULDER ARTHROPLASTY Right 08/06/2019   Procedure: Reduction of dislocated reverse total shoulder and poly exchange;  Surgeon: Netta Cedars, MD;  [facility];  Service: Orthopedics;  Laterality: Right;  need 1 hour   RIGHT/LEFT HEART CATH AND CORONARY ANGIOGRAPHY N/A 04/02/2019   Procedure: RIGHT/LEFT HEART CATH AND CORONARY ANGIOGRAPHY;  Surgeon: Leonie Man, MD;  Location: [place] [facility];  Service: Cardiovascular;  Laterality: N/A;   SAVORY DILATION N/A 01/22/2018   Procedure: SAVORY DILATION;  Surgeon: Ronnette Juniper, MD;  [facility];  Service: Gastroenterology;  Laterality: N/A;   SAVORY DILATION N/A 04/30/2019   Procedure: SAVORY DILATION;   Surgeon: Laurence Spates, MD;  Location: [place];  Service: Endoscopy;  Laterality: N/A;  With fluro   SHOULDER HEMI-ARTHROPLASTY Right 06/14/2018   Procedure: RIGHT  REVERSE TOTAL SHOULDER OPEN POLY EXCHANGE;  Surgeon: Netta Cedars, MD;  Location: [place];  Service: Orthopedics;  Laterality: Right;   SUBMUCOSAL INJECTION  06/12/2019   Procedure: SUBMUCOSAL INJECTION;  Surgeon: Ronnette Juniper, MD;  [facility];  Service: Gastroenterology;;   TEE WITHOUT CARDIOVERSION N/A 01/29/2014   Procedure: TRANSESOPHAGEAL ECHOCARDIOGRAM (TEE);  Surgeon: Sueanne Margarita, MD;  Location[facility];  Service: Cardiovascular;  Laterality: N/A;   TEE WITHOUT CARDIOVERSION N/A 04/02/2019   Procedure: TRANSESOPHAGEAL ECHOCARDIOGRAM (TEE);  Surgeon: Josue Hector, MD;  Location: [facility];  Service: Cardiovascular;  Laterality: N/A;   TEE WITHOUT CARDIOVERSION N/A 09/25/2019   Procedure: TRANSESOPHAGEAL ECHOCARDIOGRAM (TEE);  Surgeon: Jerline Pain, MD;  [facility];  Service: Cardiovascular;  Laterality: N/A;   TEE WITHOUT CARDIOVERSION  N/A 03/08/2022   Procedure: TRANSESOPHAGEAL ECHOCARDIOGRAM (TEE);  Surgeon: [name], MD;  [facility];  Service: Cardiovascular;  Laterality: N/A;   TONSILLECTOMY     TRANSCATHETER AORTIC VALVE REPLACEMENT, TRANSFEMORAL  04/15/2019   TRANSCATHETER AORTIC VALVE REPLACEMENT, TRANSFEMORAL N/A 04/15/2019   Procedure: TRANSCATHETER AORTIC VALVE REPLACEMENT, TRANSFEMORAL with POST BALLOON DILATION;  Surgeon: Sherren Mocha, MD;  Location: [place];  Service: Open Heart Surgery;  Laterality: N/A;    Allergies  No Known Allergies  EKGs/Labs/Other Studies Reviewed:   The following studies were reviewed today:  Echocardiogram 03/08/2022  IMPRESSIONS     1. Left ventricular ejection fraction, by estimation, is 50 to 55%. The  left ventricle has low normal function.   2. Right ventricular systolic function was not well visualized. The  right  ventricular size is mildly enlarged.   3. Left atrial size was moderately dilated. No left atrial/left atrial  appendage thrombus was detected. The LAA emptying velocity was 20 cm/s.   4. Right atrial size was mildly dilated.   5. The mitral valve is grossly normal. Mild mitral valve regurgitation.   6. Tricuspid valve regurgitation is mild to moderate.   7. The aortic valve has been repaired/replaced. Patient had initial  surgical bioprosthetic prosthesis followed by a valve-in-valve TAVR with a  26 mm Edwards Sapien prosthetic (TAVR) valve. Procedure Date: 04/15/19.  Echo findings are consistent with  normal structure and function of the aortic valve prosthesis. Trivial  perivalvular leak. Gastric views not obtained due to difficulty passing  echo probe. Visually, there is normal leaflet motion with no evidence of  aortic stenosis.   8. Following TEE, the patient underwent DCCV with 150J, 200J with initial  return to sinus bradycardia.   EKG:  EKG is  ordered today.  The ekg ordered today demonstrates rate-controlled atrial fibrillation, rate 87bpm  Recent Labs: 02/24/2022: TSH 0.691 02/28/2022: B Natriuretic Peptide 256.0 03/10/2022: Magnesium 2.0 03/20/2022: ALT 21; BUN 36; Creatinine 2.33; Hemoglobin 7.1; Platelet Count 127; Potassium 3.0; Sodium 142  Recent Lipid Panel    Component Value Date/Time   CHOL  01/14/2008 0450    174        ATP III CLASSIFICATION:  <200     mg/dL   Desirable  200-239  mg/dL   Borderline High  >=240    mg/dL   High   TRIG 43 01/14/2008 0450   HDL 65 01/14/2008 0450   CHOLHDL 2.7 01/14/2008 0450   VLDL 9 01/14/2008 0450   LDLCALC (H) 01/14/2008 0450    100        Total Cholesterol/HDL:CHD Risk Coronary Heart Disease Risk Table                     Men   Women  1/2 Average Risk   3.4   3.3    Risk Assessment/Calculations:   CHA2DS2-VASc Score = 3   This indicates a 3.2% annual risk of stroke. The patient's score is based upon: CHF  History: 1 HTN History: 0 Diabetes History: 0 Stroke History: 0 Vascular Disease History: 0 Age Score: 2 Gender Score: 0     Home Medications   Current Meds  Medication Sig   amiodarone (PACERONE) 200 MG tablet Take 1 tablet (200 mg total) by mouth daily.   apixaban (ELIQUIS) 2.5 MG TABS tablet Take 1 tablet (2.5 mg total) by mouth 2 (two) times daily.   epoetin alfa-epbx (RETACRIT) 84166 UNIT/ML injection Inject 40,000 Units into the  skin every 6 (six) weeks.   finasteride (PROSCAR) 5 MG tablet Take 5 mg by mouth daily as needed (retention).    ketotifen (ZADITOR) 0.025 % ophthalmic solution Place 1 drop into both eyes 2 (two) times daily as needed (itchy eyes).   levothyroxine (SYNTHROID) 88 MCG tablet Take 88 mcg by mouth every morning.   Multiple Vitamin (MULTIVITAMIN WITH MINERALS) TABS tablet Take 1 tablet by mouth daily.   pantoprazole (PROTONIX) 40 MG tablet Take 1 tablet (40 mg total) by mouth 2 (two) times daily before a meal for 30 days, THEN 1 tablet (40 mg total) daily.   potassium chloride SA (KLOR-CON) 20 MEQ tablet Take 1 tablet (20 mEq total) by mouth daily. NEED OV. (Patient taking differently: Take 20 mEq by mouth daily.)   predniSONE (DELTASONE) 5 MG tablet Take 5 mg by mouth daily with breakfast.    torsemide (DEMADEX) 20 MG tablet Take 1 tablet (20 mg total) by mouth daily.     Review of Systems      All other systems reviewed and are otherwise negative except as noted above.  Physical Exam    VS:  BP 98/62   Pulse 87   Ht '5\' 3"'$  (1.6 m)   Wt 106 lb 3.2 oz (48.2 kg)   SpO2 94%   BMI 18.81 kg/m  , BMI Body mass index is 18.81 kg/m.  Wt Readings from Last 3 Encounters:  03/21/22 106 lb 3.2 oz (48.2 kg)  03/20/22 105 lb 4.8 oz (47.8 kg)  03/11/22 115 lb 4.8 oz (52.3 kg)     GEN: Well nourished, well developed, in no acute distress. HEENT: normal. Neck: Supple, no JVD, carotid bruits, or masses. Cardiac: irregularly irregular, no murmurs, rubs, or  gallops. No clubbing, cyanosis. 2-3 + pitting lower ext edema.  Radials/PT 2+ and equal bilaterally.  Respiratory:  Respirations regular and unlabored, clear to auscultation bilaterally. GI: Soft, nontender, nondistended. MS: No deformity or atrophy. Skin: Warm and dry, no rash. Neuro:  Strength and sensation are intact. Psych: Normal affect.  Assessment & Plan    Atrial flutter/fib status post DCCV, recent admission for atrial fibrillation with RVR -Amiodarone reduced to 200 mg daily -Schedule cardioversion  -On Eliquis 2.5 mg twice daily (recent GI bleed with cauterization and clipping) no further bleeding  Anemia of chronic disease -2 units PRBC 03/20/2022 -Follows heme/onc and on Retacrit -Follow-up lab work in 6 weeks with follow-up appointment with them  Tobacco abuse -States that he quit smoking 4 years ago  Peripheral edema -2-3+ pitting edema in lower extremity. -He does not tolerate compression hose -His Demadex was recently decreased due to a rise in his creatinine.  Creatinine 2.3.  He will get this rechecked in 3 weeks by his primary -BP has been low normal so there would be no room to increase diuretics even if his renal function was normal  Aortic stenosis status post TAVR -Echocardiogram 7/5, LVEF 50-55%.  LA moderately dilated, RA mildly dilated, mild MR, mild to moderate TR, valve in valve TAVR with normal structure and function     Disposition: Follow up 3 months with [name], MD or APP. 1 month with Dr. Lovena Le.   Signed, Elgie Collard, PA-C 03/21/2022, 10:52 AM [place]

## 2022-03-20 NOTE — H&P (View-Only) (Signed)
Office Visit    Patient Name: Joshua Hudson Date of Encounter: 03/21/2022  PCP:  Lavone Orn, [facility]  Cardiologist:  [name], MD  Advanced Practice Provider:  No care team member to display Electrophysiologist:  None   Chief Complaint    Joshua Hudson is a 87 y.o. male with a hx of hypertension and atrial flutter who underwent EPS/RFA of typical atrial flutter but then developed LA flutter, AS s/p TAVR, anemia of chronic disease and tobacco abuse presents today for follow-up hospital appointment.  He was last seen in the office 10/03/2021 by Dr. Lovena Le.  He had been placed on amiodarone.  He felt okay at rest but gets fatigued and short of breath with exertion.  He did not have any palpitations but had not missed any of his medications.  His amiodarone dose was reduced to 200 mg daily.  Recently hospitalized for acute GI bleed and atrial fibrillation with RVR.  Outpatient recommendations included consideration for cardioversion.  He was discharged on Eliquis 2.5 mg twice a day.  Considering resumption of 5 mg twice daily dosing once hemodynamically stable and if no bleeding.  He was seen by oncology 03/20/2022 and was still feeling extremely weak and tired.  Blood pressure was low.  His hemoglobin on 03/20/2022 was 7.1 with platelets of 127 K.  2 units of PRBCs were ordered.  Of note, he seems to get blood transfusions about every 6 weeks.  Notes imply that cardioversion is scheduled for 03/22/2022.   Today, he feels extremely fatigued.  He has been unable to eat much or drink much.  He also has dizziness and lightheadedness.  His family member states that he falls 2-3 times a week.  This is dangerous considering he is on Eliquis 2.5 mg twice daily.  Unfortunately, he remains in atrial fibrillation today but he is rate controlled.  He was given 2 units of PRBCs yesterday and still feels bad today.  He was recently in the hospital for a GI bleed and had  some spots cauterized and a clip placed.  We discussed scheduling him for a cardioversion and then we will make a follow-up appointment with Dr. Lovena Le to discuss next steps.  He recently had lab work done yesterday.  We discussed increasing his fluid intake and p.o. intake to help his dizziness/lightheadedness.  He does not tolerate lower extremity compression however, he does have 2+ pitting edema today.  Recent creatinine was 2.3.  He has repeat labs scheduled with his primary in 3 weeks.  His Demadex was recently decreased due to his poor kidney function.  TSH was recently checked and it was normal.  Blood pressure is 98/62 which is better than it usually is.  Reports no chest pain, pressure, or tightness.  Reports no palpitations.    Past Medical History    Past Medical History:  Diagnosis Date   Achalasia 06/12/2019   Noted on EGD   Aortic insufficiency 03/24/2019   AI of bioprosthetic AVR severe 4 plus   Chronic diastolic CHF (congestive heart failure) ([place]) 03/24/2019   Colon polyps    s/p diverticular perforation requiring 2-stage repair   Derangement of right shoulder joint    need replacing has no use of   Diverticulosis    DJD (degenerative joint disease), lumbosacral    Dysphagia    eats soft food   Esophageal stricture    GERD (gastroesophageal reflux disease)    Hemolytic anemia (  [place])    History of blood transfusion given with 04-15-19 surgery   History of blood transfusion 07/18/2019   History of kidney stones    passed stones   Hypertension    Mitral regurgitation    moderate   Occipital neuralgia    Pancytopenia (HCC)    Postoperative atrial fibrillation ([place]) 05/10/2015   Prosthetic valve dysfunction    Rheumatoid arthritis ([place])    s/o long term steroids  shoulders and hands   Rotator cuff arthropathy    right   S/P aortic valve replacement with bioprosthetic valve 01/16/2008   32m Edwards Magna perimount bovine pericardial tissue valve, model 3000   S/P  valve-in-valve TAVR 04/15/2019   26 mm Edwards Sapien 3 Ultra transcatheter heart valve placed via percutaneous right transfemoral approach    SBE (subacute bacterial endocarditis) prophylaxis candidate    for dental procedures   Schatzki's ring 06/12/2019   Narrowing, Noted on EGD   Severe aortic stenosis    S/P prosthetic valve replacement w 25 mm Edwards like science percardial tissue valve,[place] - 01/2008   Thrombocytopenia ([place]    Past Surgical History:  Procedure Laterality Date   A-FLUTTER ABLATION N/A 10/10/2019   Procedure: A-FLUTTER ABLATION;  Surgeon: TEvans Lance MD;  [facility];  Service: Cardiovascular;  Laterality: N/A;   APPENDECTOMY     BALLOON DILATION N/A 06/12/2019   Procedure: BALLOON DILATION;  Surgeon: KRonnette Juniper MD;  [facility];  Service: Gastroenterology;  Laterality: N/A;   BOTOX INJECTION N/A 01/22/2018   Procedure: BOTOX INJECTION;  Surgeon: KRonnette Juniper MD;  [facility];  Service: Gastroenterology;  Laterality: N/A;   CARDIAC CATHETERIZATION     09   CARDIAC VALVE REPLACEMENT  01/2008   aortic valve replacement   CARDIOVERSION N/A 09/25/2019   Procedure: CARDIOVERSION;  Surgeon: SJerline Pain MD;  [facility]  Service: Cardiovascular;  Laterality: N/A;   CARDIOVERSION N/A 12/25/2019   Procedure: CARDIOVERSION;  Surgeon: CLelon Perla MD;  [facility];  Service: Cardiovascular;  Laterality: N/A;   CARDIOVERSION N/A 03/08/2022   Procedure: CARDIOVERSION;  Surgeon: PFreada Bergeron MD;  [facility]  Service: Cardiovascular;  Laterality: N/A;   CATARACT EXTRACTION W/ INTRAOCULAR LENS  IMPLANT, BILATERAL     COLON RESECTION     diverticulitis    COLONOSCOPY N/A 03/03/2022   Procedure: COLONOSCOPY;  Surgeon: BOtis Brace MD;  [facility];  Service: Gastroenterology;  Laterality: N/A;   CORONARY ANGIOPLASTY     dental implants     permanent   ENTEROSCOPY N/A  03/03/2022   Procedure: ENTEROSCOPY;  Surgeon: BOtis Brace MD;  [facility];  Service: Gastroenterology;  Laterality: N/A;   ESOPHAGEAL MANOMETRY N/A 11/07/2017   Procedure: ESOPHAGEAL MANOMETRY (EM);  Surgeon: KRonnette Juniper MD;  [facility];  Service: Gastroenterology;  Laterality: N/A;   ESOPHAGOGASTRODUODENOSCOPY N/A 02/24/2022   Procedure: ESOPHAGOGASTRODUODENOSCOPY (EGD);  Surgeon: OArta Silence MD;  [facility];  Service: Gastroenterology;  Laterality: N/A;   ESOPHAGOGASTRODUODENOSCOPY (EGD) WITH PROPOFOL N/A 01/22/2018   Procedure: ESOPHAGOGASTRODUODENOSCOPY (EGD) WITH PROPOFOL;  Surgeon: KRonnette Juniper MD;  [facility];  Service: Gastroenterology;  Laterality: N/A;   ESOPHAGOGASTRODUODENOSCOPY (EGD) WITH PROPOFOL N/A 04/30/2019   Procedure: ESOPHAGOGASTRODUODENOSCOPY (EGD) WITH PROPOFOL;  Surgeon: ELaurence Spates MD;  [facility] [facility]  Service: Endoscopy;  Laterality: N/A;   ESOPHAGOGASTRODUODENOSCOPY (EGD) WITH PROPOFOL N/A 06/12/2019   Procedure: ESOPHAGOGASTRODUODENOSCOPY (EGD) WITH PROPOFOL;  Surgeon: KRonnette Juniper MD;  [facility];  Service: Gastroenterology;  Laterality: N/A;  with botox injection   EXCISIONAL TOTAL SHOULDER ARTHROPLASTY WITH ANTIBIOTIC SPACER Right 12/31/2018   Procedure: EXCISIONAL TOTAL SHOULDER ARTHROPLASTY WITH ANTIBIOTIC SPACER;  Surgeon: Netta Cedars, MD;  Location: [place];  Service: Orthopedics;  Laterality: Right;   GIVENS CAPSULE STUDY N/A 03/01/2022   Procedure: GIVENS CAPSULE STUDY;  Surgeon: Otis Brace, MD;  [facility];  Service: Gastroenterology;  Laterality: N/A;   HEMOSTASIS CLIP PLACEMENT  03/03/2022   Procedure: HEMOSTASIS CLIP PLACEMENT;  Surgeon: Otis Brace, MD;  [facility];  Service: Gastroenterology;;   HERNIA REPAIR     HOT HEMOSTASIS N/A 03/03/2022   Procedure: HOT HEMOSTASIS (ARGON PLASMA COAGULATION/BICAP);  Surgeon: Otis Brace, MD;   [facility];  Service: Gastroenterology;  Laterality: N/A;  EGD and COLON   INTRAOPERATIVE TRANSTHORACIC ECHOCARDIOGRAM N/A 04/15/2019   Procedure: Intraoperative Transthoracic Echocardiogram;  Surgeon: Sherren Mocha, MD;  Location: [place];  Service: Open Heart Surgery;  Laterality: N/A;   IRRIGATION AND DEBRIDEMENT SHOULDER Right 11/20/2018    IRRIGATION AND DEBRIDEMENT SHOULDER WITH POLY EXCHANGE (Right Shoulder)   IRRIGATION AND DEBRIDEMENT SHOULDER Right 11/20/2018   Procedure: IRRIGATION AND DEBRIDEMENT SHOULDER WITH POLY EXCHANGE;  Surgeon: Netta Cedars, MD;  Location: [place];  Service: Orthopedics;  Laterality: Right;   LUMBAR LAMINECTOMY     x 2   POLYPECTOMY  03/03/2022   Procedure: POLYPECTOMY;  Surgeon: Otis Brace, MD;  [facility];  Service: Gastroenterology;;   REVERSE SHOULDER ARTHROPLASTY Right 03/01/2018   Procedure: RIGHT REVERSE SHOULDER ARTHROPLASTY;  Surgeon: Netta Cedars, MD;  Location: [place];  Service: Orthopedics;  Laterality: Right;   REVERSE SHOULDER ARTHROPLASTY Right 07/18/2019   Procedure: REVERSE TOTAL SHOULDER ARTHROPLASTY and removal of antiobotic spacer;  Surgeon: Netta Cedars, MD;  [facility];  Service: Orthopedics;  Laterality: Right;  interscalene block   REVERSE SHOULDER ARTHROPLASTY Right 08/06/2019   Procedure: Reduction of dislocated reverse total shoulder and poly exchange;  Surgeon: Netta Cedars, MD;  [facility];  Service: Orthopedics;  Laterality: Right;  need 1 hour   RIGHT/LEFT HEART CATH AND CORONARY ANGIOGRAPHY N/A 04/02/2019   Procedure: RIGHT/LEFT HEART CATH AND CORONARY ANGIOGRAPHY;  Surgeon: Leonie Man, MD;  Location: [place] [facility];  Service: Cardiovascular;  Laterality: N/A;   SAVORY DILATION N/A 01/22/2018   Procedure: SAVORY DILATION;  Surgeon: Ronnette Juniper, MD;  [facility];  Service: Gastroenterology;  Laterality: N/A;   SAVORY DILATION N/A 04/30/2019   Procedure: SAVORY DILATION;   Surgeon: Laurence Spates, MD;  Location: [place];  Service: Endoscopy;  Laterality: N/A;  With fluro   SHOULDER HEMI-ARTHROPLASTY Right 06/14/2018   Procedure: RIGHT  REVERSE TOTAL SHOULDER OPEN POLY EXCHANGE;  Surgeon: Netta Cedars, MD;  Location: [place];  Service: Orthopedics;  Laterality: Right;   SUBMUCOSAL INJECTION  06/12/2019   Procedure: SUBMUCOSAL INJECTION;  Surgeon: Ronnette Juniper, MD;  [facility];  Service: Gastroenterology;;   TEE WITHOUT CARDIOVERSION N/A 01/29/2014   Procedure: TRANSESOPHAGEAL ECHOCARDIOGRAM (TEE);  Surgeon: Sueanne Margarita, MD;  [facility];  Service: Cardiovascular;  Laterality: N/A;   TEE WITHOUT CARDIOVERSION N/A 04/02/2019   Procedure: TRANSESOPHAGEAL ECHOCARDIOGRAM (TEE);  Surgeon: Josue Hector, MD;  Location: [facility];  Service: Cardiovascular;  Laterality: N/A;   TEE WITHOUT CARDIOVERSION N/A 09/25/2019   Procedure: TRANSESOPHAGEAL ECHOCARDIOGRAM (TEE);  Surgeon: Jerline Pain, MD;  [facility];  Service: Cardiovascular;  Laterality: N/A;   TEE WITHOUT CARDIOVERSION  N/A 03/08/2022   Procedure: TRANSESOPHAGEAL ECHOCARDIOGRAM (TEE);  Surgeon: [name], MD;  [facility]: [facility];  Service: Cardiovascular;  Laterality: N/A;   TONSILLECTOMY     TRANSCATHETER AORTIC VALVE REPLACEMENT, TRANSFEMORAL  04/15/2019   TRANSCATHETER AORTIC VALVE REPLACEMENT, TRANSFEMORAL N/A 04/15/2019   Procedure: TRANSCATHETER AORTIC VALVE REPLACEMENT, TRANSFEMORAL with POST BALLOON DILATION;  Surgeon: Sherren Mocha, MD;  Location: [place];  Service: Open Heart Surgery;  Laterality: N/A;    Allergies  No Known Allergies  EKGs/Labs/Other Studies Reviewed:   The following studies were reviewed today:  Echocardiogram 03/08/2022  IMPRESSIONS     1. Left ventricular ejection fraction, by estimation, is 50 to 55%. The  left ventricle has low normal function.   2. Right ventricular systolic function was not well visualized. The  right  ventricular size is mildly enlarged.   3. Left atrial size was moderately dilated. No left atrial/left atrial  appendage thrombus was detected. The LAA emptying velocity was 20 cm/s.   4. Right atrial size was mildly dilated.   5. The mitral valve is grossly normal. Mild mitral valve regurgitation.   6. Tricuspid valve regurgitation is mild to moderate.   7. The aortic valve has been repaired/replaced. Patient had initial  surgical bioprosthetic prosthesis followed by a valve-in-valve TAVR with a  26 mm Edwards Sapien prosthetic (TAVR) valve. Procedure Date: 04/15/19.  Echo findings are consistent with  normal structure and function of the aortic valve prosthesis. Trivial  perivalvular leak. Gastric views not obtained due to difficulty passing  echo probe. Visually, there is normal leaflet motion with no evidence of  aortic stenosis.   8. Following TEE, the patient underwent DCCV with 150J, 200J with initial  return to sinus bradycardia.   EKG:  EKG is  ordered today.  The ekg ordered today demonstrates rate-controlled atrial fibrillation, rate 87bpm  Recent Labs: 02/24/2022: TSH 0.691 02/28/2022: B Natriuretic Peptide 256.0 03/10/2022: Magnesium 2.0 03/20/2022: ALT 21; BUN 36; Creatinine 2.33; Hemoglobin 7.1; Platelet Count 127; Potassium 3.0; Sodium 142  Recent Lipid Panel    Component Value Date/Time   CHOL  01/14/2008 0450    174        ATP III CLASSIFICATION:  <200     mg/dL   Desirable  200-239  mg/dL   Borderline High  >=240    mg/dL   High   TRIG 43 01/14/2008 0450   HDL 65 01/14/2008 0450   CHOLHDL 2.7 01/14/2008 0450   VLDL 9 01/14/2008 0450   LDLCALC (H) 01/14/2008 0450    100        Total Cholesterol/HDL:CHD Risk Coronary Heart Disease Risk Table                     Men   Women  1/2 Average Risk   3.4   3.3    Risk Assessment/Calculations:   CHA2DS2-VASc Score = 3   This indicates a 3.2% annual risk of stroke. The patient's score is based upon: CHF  History: 1 HTN History: 0 Diabetes History: 0 Stroke History: 0 Vascular Disease History: 0 Age Score: 2 Gender Score: 0     Home Medications   Current Meds  Medication Sig   amiodarone (PACERONE) 200 MG tablet Take 1 tablet (200 mg total) by mouth daily.   apixaban (ELIQUIS) 2.5 MG TABS tablet Take 1 tablet (2.5 mg total) by mouth 2 (two) times daily.   epoetin alfa-epbx (RETACRIT) 70350 UNIT/ML injection Inject 40,000 Units into the  skin every 6 (six) weeks.   finasteride (PROSCAR) 5 MG tablet Take 5 mg by mouth daily as needed (retention).    ketotifen (ZADITOR) 0.025 % ophthalmic solution Place 1 drop into both eyes 2 (two) times daily as needed (itchy eyes).   levothyroxine (SYNTHROID) 88 MCG tablet Take 88 mcg by mouth every morning.   Multiple Vitamin (MULTIVITAMIN WITH MINERALS) TABS tablet Take 1 tablet by mouth daily.   pantoprazole (PROTONIX) 40 MG tablet Take 1 tablet (40 mg total) by mouth 2 (two) times daily before a meal for 30 days, THEN 1 tablet (40 mg total) daily.   potassium chloride SA (KLOR-CON) 20 MEQ tablet Take 1 tablet (20 mEq total) by mouth daily. NEED OV. (Patient taking differently: Take 20 mEq by mouth daily.)   predniSONE (DELTASONE) 5 MG tablet Take 5 mg by mouth daily with breakfast.    torsemide (DEMADEX) 20 MG tablet Take 1 tablet (20 mg total) by mouth daily.     Review of Systems      All other systems reviewed and are otherwise negative except as noted above.  Physical Exam    VS:  BP 98/62   Pulse 87   Ht '5\' 3"'$  (1.6 m)   Wt 106 lb 3.2 oz (48.2 kg)   SpO2 94%   BMI 18.81 kg/m  , BMI Body mass index is 18.81 kg/m.  Wt Readings from Last 3 Encounters:  03/21/22 106 lb 3.2 oz (48.2 kg)  03/20/22 105 lb 4.8 oz (47.8 kg)  03/11/22 115 lb 4.8 oz (52.3 kg)     GEN: Well nourished, well developed, in no acute distress. HEENT: normal. Neck: Supple, no JVD, carotid bruits, or masses. Cardiac: irregularly irregular, no murmurs, rubs, or  gallops. No clubbing, cyanosis. 2-3 + pitting lower ext edema.  Radials/PT 2+ and equal bilaterally.  Respiratory:  Respirations regular and unlabored, clear to auscultation bilaterally. GI: Soft, nontender, nondistended. MS: No deformity or atrophy. Skin: Warm and dry, no rash. Neuro:  Strength and sensation are intact. Psych: Normal affect.  Assessment & Plan    Atrial flutter/fib status post DCCV, recent admission for atrial fibrillation with RVR -Amiodarone reduced to 200 mg daily -Schedule cardioversion  -On Eliquis 2.5 mg twice daily (recent GI bleed with cauterization and clipping) no further bleeding  Anemia of chronic disease -2 units PRBC 03/20/2022 -Follows heme/onc and on Retacrit -Follow-up lab work in 6 weeks with follow-up appointment with them  Tobacco abuse -States that he quit smoking 4 years ago  Peripheral edema -2-3+ pitting edema in lower extremity. -He does not tolerate compression hose -His Demadex was recently decreased due to a rise in his creatinine.  Creatinine 2.3.  He will get this rechecked in 3 weeks by his primary -BP has been low normal so there would be no room to increase diuretics even if his renal function was normal  Aortic stenosis status post TAVR -Echocardiogram 7/5, LVEF 50-55%.  LA moderately dilated, RA mildly dilated, mild MR, mild to moderate TR, valve in valve TAVR with normal structure and function     Disposition: Follow up 3 months with [name], MD or APP. 1 month with Dr. Lovena Le.   Signed, Elgie Collard, PA-C 03/21/2022, 10:52 AM [place]

## 2022-03-20 NOTE — Assessment & Plan Note (Addendum)
Hemoglobin electrophoresis: Beta thalassemia minor.This is the cause of microcytosis. It is not iron deficiency related.  Possible differential:Anemia of chronic disease versus myelodysplastic syndrome versus methotrexate related toxicity.    Hospitalization 09/22/2019-09/25/2019: A. fib with RVR status post cardioversion, currently on Eliquis Hospitalization 02/28/2022-03/11/2022  Lab review: 09/25/2019: WBC 7.2, hemoglobin 9.6, platelets 145 10/03/19: WBC: 7.5, Hb 8.9, Pl: 135 04/18/2021: Hemoglobin 8.7 (Retacrit), blood transfusion on 04/04/2021 for hemoglobin of 7.7 05/30/21:Hemoglobin 6.7, blood transfusion, platelets 85 (will need to be watched) 08/23/2021: Hemoglobin 9.6 (Retacrit) 10/04/2021: Hemoglobin 8, platelets 151 (2 units of PRBC) 12/05/2021: Hemoglobin 8.7 12/26/2021: Hemoglobin 7.1, platelets 118 03/08/22:  Hemoglobin 8.7, platelets 148 03/20/2022: Hemoglobin 7.1, platelets 127 (2 units of PRBC)  His anemia is due to chronickidneydiseasestage III. He will get 2 units of PRBC today. (Seems to get blood transfusions every 6 weeks)  Return to clinic in 3 weeks for labs and blood transfusion if needed. I will see him back in 6 weeks.

## 2022-03-21 ENCOUNTER — Encounter: Payer: Self-pay | Admitting: Physician Assistant

## 2022-03-21 ENCOUNTER — Inpatient Hospital Stay: Payer: Medicare PPO

## 2022-03-21 ENCOUNTER — Inpatient Hospital Stay: Payer: Medicare PPO | Admitting: Hematology and Oncology

## 2022-03-21 ENCOUNTER — Encounter: Payer: Self-pay | Admitting: *Deleted

## 2022-03-21 ENCOUNTER — Ambulatory Visit: Payer: Medicare PPO | Admitting: Physician Assistant

## 2022-03-21 VITALS — BP 98/62 | HR 87 | Ht 63.0 in | Wt 106.2 lb

## 2022-03-21 DIAGNOSIS — Z952 Presence of prosthetic heart valve: Secondary | ICD-10-CM

## 2022-03-21 DIAGNOSIS — I350 Nonrheumatic aortic (valve) stenosis: Secondary | ICD-10-CM

## 2022-03-21 DIAGNOSIS — R609 Edema, unspecified: Secondary | ICD-10-CM

## 2022-03-21 DIAGNOSIS — I484 Atypical atrial flutter: Secondary | ICD-10-CM

## 2022-03-21 DIAGNOSIS — Z720 Tobacco use: Secondary | ICD-10-CM

## 2022-03-21 DIAGNOSIS — I480 Paroxysmal atrial fibrillation: Secondary | ICD-10-CM | POA: Diagnosis not present

## 2022-03-21 DIAGNOSIS — R600 Localized edema: Secondary | ICD-10-CM

## 2022-03-21 LAB — BPAM RBC
Blood Product Expiration Date: 202307312359
Blood Product Expiration Date: 202307312359
ISSUE DATE / TIME: 202307171006
ISSUE DATE / TIME: 202307171006
Unit Type and Rh: 6200
Unit Type and Rh: 6200

## 2022-03-21 LAB — TYPE AND SCREEN
ABO/RH(D): A POS
Antibody Screen: NEGATIVE
Unit division: 0
Unit division: 0

## 2022-03-21 NOTE — Patient Instructions (Addendum)
Medication Instructions:  Your physician recommends that you continue on your current medications as directed. Please refer to the Current Medication list given to you today.  *If you need a refill on your cardiac medications before your next appointment, please call your pharmacy*   Lab Work: None If you have labs (blood work) drawn today and your tests are completely normal, you will receive your results only by: [place] (if you have MyChart) OR A paper copy in the mail If you have any lab test that is abnormal or we need to change your treatment, we will call you to review the results.   Testing/Procedures: Your physician has recommended that you have a Cardioversion (DCCV). Electrical Cardioversion uses a jolt of electricity to your heart either through paddles or wired patches attached to your chest. This is a controlled, usually prescheduled, procedure. Defibrillation is done under light anesthesia in the hospital, and you usually go home the day of the procedure. This is done to get your heart back into a normal rhythm. You are not awake for the procedure. Please see the instruction sheet given to you today.    Follow-Up: At [facility], you and your health needs are our priority.  As part of our continuing mission to provide you with exceptional heart care, we have created designated Provider Care Teams.  These Care Teams include your primary Cardiologist (physician) and Advanced Practice Providers (APPs -  Physician Assistants and Nurse Practitioners) who all work together to provide you with the care you need, when you need it.  Your next appointment:   04/25/22 at 3:15 PM with Dr Lovena Le as scheduled 3 months with Dr Radford Pax   Important Information About Sugar

## 2022-03-23 DIAGNOSIS — I5030 Unspecified diastolic (congestive) heart failure: Secondary | ICD-10-CM | POA: Diagnosis not present

## 2022-03-23 DIAGNOSIS — D649 Anemia, unspecified: Secondary | ICD-10-CM | POA: Diagnosis not present

## 2022-03-23 DIAGNOSIS — I482 Chronic atrial fibrillation, unspecified: Secondary | ICD-10-CM | POA: Diagnosis not present

## 2022-03-23 DIAGNOSIS — R197 Diarrhea, unspecified: Secondary | ICD-10-CM | POA: Diagnosis not present

## 2022-03-24 ENCOUNTER — Encounter (HOSPITAL_COMMUNITY): Payer: Self-pay | Admitting: Cardiology

## 2022-03-24 DIAGNOSIS — E039 Hypothyroidism, unspecified: Secondary | ICD-10-CM | POA: Diagnosis not present

## 2022-03-24 DIAGNOSIS — N401 Enlarged prostate with lower urinary tract symptoms: Secondary | ICD-10-CM | POA: Diagnosis not present

## 2022-03-24 DIAGNOSIS — I480 Paroxysmal atrial fibrillation: Secondary | ICD-10-CM | POA: Diagnosis not present

## 2022-03-24 DIAGNOSIS — R197 Diarrhea, unspecified: Secondary | ICD-10-CM | POA: Diagnosis not present

## 2022-03-24 DIAGNOSIS — I5040 Unspecified combined systolic (congestive) and diastolic (congestive) heart failure: Secondary | ICD-10-CM | POA: Diagnosis not present

## 2022-03-24 DIAGNOSIS — K219 Gastro-esophageal reflux disease without esophagitis: Secondary | ICD-10-CM | POA: Diagnosis not present

## 2022-03-27 ENCOUNTER — Telehealth: Payer: Self-pay | Admitting: Hematology and Oncology

## 2022-03-27 ENCOUNTER — Other Ambulatory Visit: Payer: Self-pay | Admitting: Physician Assistant

## 2022-03-27 DIAGNOSIS — I4891 Unspecified atrial fibrillation: Secondary | ICD-10-CM

## 2022-03-27 NOTE — Telephone Encounter (Signed)
Scheduled appointment per 7/18 [place]. Talked with the patients daughter and she is aware.

## 2022-03-28 ENCOUNTER — Encounter (HOSPITAL_COMMUNITY): Payer: Self-pay | Admitting: Cardiology

## 2022-03-28 ENCOUNTER — Ambulatory Visit (HOSPITAL_COMMUNITY)
Admission: RE | Admit: 2022-03-28 | Discharge: 2022-03-28 | Disposition: A | Discharge status: Home / Self Care | Payer: Medicare PPO | Attending: Cardiology | Admitting: Cardiology

## 2022-03-28 ENCOUNTER — Ambulatory Visit (HOSPITAL_COMMUNITY): Payer: Medicare PPO | Admitting: Anesthesiology

## 2022-03-28 ENCOUNTER — Other Ambulatory Visit: Payer: Self-pay

## 2022-03-28 ENCOUNTER — Ambulatory Visit (HOSPITAL_BASED_OUTPATIENT_CLINIC_OR_DEPARTMENT_OTHER): Payer: Medicare PPO | Admitting: Anesthesiology

## 2022-03-28 ENCOUNTER — Encounter (HOSPITAL_COMMUNITY)
Admission: RE | Disposition: A | Discharge status: Home / Self Care | Payer: Self-pay | Source: Home / Self Care | Attending: Cardiology

## 2022-03-28 DIAGNOSIS — K219 Gastro-esophageal reflux disease without esophagitis: Secondary | ICD-10-CM | POA: Insufficient documentation

## 2022-03-28 DIAGNOSIS — R600 Localized edema: Secondary | ICD-10-CM | POA: Insufficient documentation

## 2022-03-28 DIAGNOSIS — Z87891 Personal history of nicotine dependence: Secondary | ICD-10-CM | POA: Insufficient documentation

## 2022-03-28 DIAGNOSIS — Z79899 Other long term (current) drug therapy: Secondary | ICD-10-CM | POA: Diagnosis not present

## 2022-03-28 DIAGNOSIS — Z9181 History of falling: Secondary | ICD-10-CM | POA: Insufficient documentation

## 2022-03-28 DIAGNOSIS — R609 Edema, unspecified: Secondary | ICD-10-CM | POA: Diagnosis not present

## 2022-03-28 DIAGNOSIS — I110 Hypertensive heart disease with heart failure: Secondary | ICD-10-CM | POA: Insufficient documentation

## 2022-03-28 DIAGNOSIS — E039 Hypothyroidism, unspecified: Secondary | ICD-10-CM | POA: Insufficient documentation

## 2022-03-28 DIAGNOSIS — Z7901 Long term (current) use of anticoagulants: Secondary | ICD-10-CM | POA: Insufficient documentation

## 2022-03-28 DIAGNOSIS — D638 Anemia in other chronic diseases classified elsewhere: Secondary | ICD-10-CM | POA: Diagnosis not present

## 2022-03-28 DIAGNOSIS — I509 Heart failure, unspecified: Secondary | ICD-10-CM | POA: Diagnosis not present

## 2022-03-28 DIAGNOSIS — I4891 Unspecified atrial fibrillation: Secondary | ICD-10-CM | POA: Insufficient documentation

## 2022-03-28 DIAGNOSIS — Z953 Presence of xenogenic heart valve: Secondary | ICD-10-CM | POA: Insufficient documentation

## 2022-03-28 DIAGNOSIS — I5032 Chronic diastolic (congestive) heart failure: Secondary | ICD-10-CM | POA: Diagnosis not present

## 2022-03-28 DIAGNOSIS — I4892 Unspecified atrial flutter: Secondary | ICD-10-CM | POA: Diagnosis not present

## 2022-03-28 HISTORY — PX: CARDIOVERSION: SHX1299

## 2022-03-28 LAB — POCT I-STAT, CHEM 8
BUN: 37 mg/dL — ABNORMAL HIGH (ref 8–23)
Calcium, Ion: 0.86 mmol/L — CL (ref 1.15–1.40)
Chloride: 104 mmol/L (ref 98–111)
Creatinine, Ser: 2.3 mg/dL — ABNORMAL HIGH (ref 0.61–1.24)
Glucose, Bld: 72 mg/dL (ref 70–99)
HCT: 31 % — ABNORMAL LOW (ref 39.0–52.0)
Hemoglobin: 10.5 g/dL — ABNORMAL LOW (ref 13.0–17.0)
Potassium: 3 mmol/L — ABNORMAL LOW (ref 3.5–5.1)
Sodium: 140 mmol/L (ref 135–145)
TCO2: 27 mmol/L (ref 22–32)

## 2022-03-28 SURGERY — CARDIOVERSION
Anesthesia: General

## 2022-03-28 MED ORDER — SODIUM CHLORIDE 0.9 % IV SOLN: INTRAVENOUS | Status: DC | PRN

## 2022-03-28 MED ORDER — PROPOFOL 10 MG/ML IV BOLUS
INTRAVENOUS | Status: DC | PRN
  Administered 2022-03-28: 50 mg via INTRAVENOUS

## 2022-03-28 MED ORDER — LIDOCAINE 2% (20 MG/ML) 5 ML SYRINGE
INTRAMUSCULAR | Status: DC | PRN
  Administered 2022-03-28: 50 mg via INTRAVENOUS

## 2022-03-28 MED ORDER — EPHEDRINE SULFATE-NACL 50-0.9 MG/10ML-% IV SOSY
PREFILLED_SYRINGE | INTRAVENOUS | Status: DC | PRN
  Administered 2022-03-28 (×2): 5 mg via INTRAVENOUS

## 2022-03-28 NOTE — Anesthesia Postprocedure Evaluation (Signed)
Anesthesia Post Note  Patient: Joshua Hudson  Procedure(s) Performed: CARDIOVERSION     Patient location during evaluation: Endoscopy Anesthesia Type: General Level of consciousness: awake and alert Pain management: pain level controlled Vital Signs Assessment: post-procedure vital signs reviewed and stable Respiratory status: spontaneous breathing, nonlabored ventilation and respiratory function stable Cardiovascular status: blood pressure returned to baseline and stable Postop Assessment: no apparent nausea or vomiting Anesthetic complications: no   No notable events documented.  Last Vitals:  Vitals:   03/28/22 1250 03/28/22 1300  BP: (!) 100/52 (!) 110/56  Pulse: (!) 54 (!) 55  Resp: 16 12  Temp:    SpO2: 99% 99%    Last Pain:  Vitals:   03/28/22 1300  TempSrc:   PainSc: 0-No pain                 Belenda Cruise P Candelario [place]

## 2022-03-28 NOTE — Transfer of Care (Signed)
Immediate Anesthesia Transfer of Care Note  Patient: Joshua Hudson  Procedure(s) Performed: CARDIOVERSION  Patient Location: Endoscopy Unit  Anesthesia Type:General  Level of Consciousness: drowsy  Airway & Oxygen Therapy: Patient Spontanous Breathing  Post-op Assessment: Report given to RN and Post -op Vital signs reviewed and stable  Post vital signs: Reviewed and stable  Last Vitals:  Vitals Value Taken Time  BP 98/53 (67)   Temp    Pulse 51   Resp 16   SpO2 98     Last Pain:  Vitals:   03/28/22 1151  TempSrc: Temporal  PainSc: 0-No pain         Complications: No notable events documented.

## 2022-03-28 NOTE — CV Procedure (Signed)
   Electrical Cardioversion Procedure Note Joshua Hudson [id number] Sep 27, 1934  Procedure: Electrical Cardioversion Indications:  Atrial Fibrillation  Time Out: Verified patient identification, verified procedure,medications/allergies/relevent history reviewed, required imaging and test results available.  Performed  Procedure Details  The patient signed informed consent.   The patient was NPO past midnight. Has had therapeutic anticoagulation with Eliquis greater than 3 weeks. The patient denies any interruption of anticoagulation.  Anesthesia was administered by the anesthesia team.  Adequate airway was maintained throughout and vital followed per protocol.  He was cardioverted x 1 with 200 J of biphasic synchronized energy.  He converted to NSR.  There were no apparent complications.  The patient tolerated the procedure well and had normal neuro status and respiratory status post procedure with vitals stable as recorded elsewhere.     IMPRESSION:  Successful cardioversion of atrial fibrillation to sinus bradycardia   Follow up:  We will arrange follow up with Primary cardiologist.  He will continue on current medical therapy.  The patient advised to continue anticoagulation.  Joshua Hudson 03/28/2022, 12:35 PM

## 2022-03-28 NOTE — Anesthesia Preprocedure Evaluation (Signed)
Anesthesia Evaluation  Patient identified by MRN, date of birth, ID band Patient awake    Reviewed: Allergy & Precautions, NPO status , Patient's Chart, lab work & pertinent test results  Airway Mallampati: II  TM Distance: >3 FB Neck ROM: Full    Dental no notable dental hx.    Pulmonary neg pulmonary ROS, former smoker,    Pulmonary exam normal        Cardiovascular hypertension, +CHF  + dysrhythmias Atrial Fibrillation  Rhythm:Irregular Rate:Normal + Systolic murmurs S/P AVR TAVR 2020   Neuro/Psych negative neurological ROS  negative psych ROS   GI/Hepatic Neg liver ROS, GERD  Medicated,  Endo/Other  Hypothyroidism   Renal/GU   negative genitourinary   Musculoskeletal  (+) Arthritis , Osteoarthritis,    Abdominal Normal abdominal exam  (+)   Peds  Hematology  (+) Blood dyscrasia, anemia ,   Anesthesia Other Findings   Reproductive/Obstetrics                             Anesthesia Physical Anesthesia Plan  ASA: 3  Anesthesia Plan: General   Post-op Pain Management:    Induction: Intravenous  PONV Risk Score and Plan: 2 and Treatment may vary due to age or medical condition  Airway Management Planned: Mask  Additional Equipment: None  Intra-op Plan:   Post-operative Plan:   Informed Consent: I have reviewed the patients History and Physical, chart, labs and discussed the procedure including the risks, benefits and alternatives for the proposed anesthesia with the patient or authorized representative who has indicated his/her understanding and acceptance.     Dental advisory given  Plan Discussed with: CRNA  Anesthesia Plan Comments: (Lab Results      Component                Value               Date                      WBC                      7.8                 03/20/2022                HGB                      7.1 (L)             03/20/2022                HCT                       22.0 (L)            03/20/2022                MCV                      73.8 (L)            03/20/2022                PLT                      127 (L)  03/20/2022          \ Lab Results      Component                Value               Date                      NA                       142                 03/20/2022                K                        3.0 (L)             03/20/2022                CO2                      26                  03/20/2022                GLUCOSE                  95                  03/20/2022                BUN                      36 (H)              03/20/2022                CREATININE               2.33 (H)            03/20/2022                CALCIUM                  8.6 (L)             03/20/2022                GFRNONAA                 27 (L)              03/20/2022            ECHO 07/23: 1. Left ventricular ejection fraction, by estimation, is 50 to 55%. The  left ventricle has low normal function.  2. Right ventricular systolic function was not well visualized. The right  ventricular size is mildly enlarged.  3. Left atrial size was moderately dilated. No left atrial/left atrial  appendage thrombus was detected. The LAA emptying velocity was 20 cm/s.  4. Right atrial size was mildly dilated.  5. The mitral valve is grossly normal. Mild mitral valve regurgitation.  6. Tricuspid valve regurgitation is mild to moderate.  7. The aortic valve has been repaired/replaced. Patient had initial  surgical bioprosthetic prosthesis followed by a valve-in-valve TAVR with a  26 mm Edwards Sapien prosthetic (TAVR) valve.  Procedure Date: 04/15/19.  Echo findings are consistent with  normal structure and function of the aortic valve prosthesis. Trivial  perivalvular leak. Gastric views not obtained due to difficulty passing  echo probe. Visually, there is normal leaflet motion with no evidence of  aortic stenosis.  8. Following  TEE, the patient underwent DCCV with 150J, 200J with initial  return to sinus bradycardia. )        Anesthesia Quick Evaluation

## 2022-03-28 NOTE — Discharge Instructions (Signed)

## 2022-03-28 NOTE — Interval H&P Note (Signed)
History and Physical Interval Note:  03/28/2022 11:36 AM  Joshua Hudson  has presented today for surgery, with the diagnosis of AFIB.  The various methods of treatment have been discussed with the patient and family. After consideration of risks, benefits and other options for treatment, the patient has consented to  Procedure(s): CARDIOVERSION (N/A) as a surgical intervention.  The patient's history has been reviewed, patient examined, no change in status, stable for surgery.  I have reviewed the patient's chart and labs.  Questions were answered to the patient's satisfaction.     Roxanne Panek

## 2022-03-28 NOTE — Anesthesia Procedure Notes (Signed)
Procedure Name: General with mask airway Date/Time: 03/28/2022 12:27 PM  Performed by: Dorann Lodge, CRNAPre-anesthesia Checklist: Patient identified, Emergency Drugs available, Suction available and Patient being monitored Patient Re-evaluated:Patient Re-evaluated prior to induction Oxygen Delivery Method: Ambu bag Preoxygenation: Pre-oxygenation with 100% oxygen Induction Type: IV induction Dental Injury: Teeth and Oropharynx as per pre-operative assessment

## 2022-03-29 ENCOUNTER — Encounter (HOSPITAL_COMMUNITY): Payer: Self-pay | Admitting: Cardiology

## 2022-03-30 ENCOUNTER — Ambulatory Visit
Admission: RE | Admit: 2022-03-30 | Discharge: 2022-03-30 | Disposition: A | Discharge status: Home / Self Care | Payer: Medicare PPO | Source: Ambulatory Visit | Attending: Rheumatology | Admitting: Rheumatology

## 2022-03-30 DIAGNOSIS — M810 Age-related osteoporosis without current pathological fracture: Secondary | ICD-10-CM

## 2022-03-30 DIAGNOSIS — M85851 Other specified disorders of bone density and structure, right thigh: Secondary | ICD-10-CM | POA: Diagnosis not present

## 2022-04-04 DIAGNOSIS — R197 Diarrhea, unspecified: Secondary | ICD-10-CM | POA: Diagnosis not present

## 2022-04-12 ENCOUNTER — Inpatient Hospital Stay: Payer: Medicare PPO

## 2022-04-12 DIAGNOSIS — N289 Disorder of kidney and ureter, unspecified: Secondary | ICD-10-CM | POA: Diagnosis not present

## 2022-04-12 DIAGNOSIS — I482 Chronic atrial fibrillation, unspecified: Secondary | ICD-10-CM | POA: Diagnosis not present

## 2022-04-12 DIAGNOSIS — R197 Diarrhea, unspecified: Secondary | ICD-10-CM | POA: Diagnosis not present

## 2022-04-12 DIAGNOSIS — I5030 Unspecified diastolic (congestive) heart failure: Secondary | ICD-10-CM | POA: Diagnosis not present

## 2022-04-12 DIAGNOSIS — D509 Iron deficiency anemia, unspecified: Secondary | ICD-10-CM | POA: Diagnosis not present

## 2022-04-13 DIAGNOSIS — M0579 Rheumatoid arthritis with rheumatoid factor of multiple sites without organ or systems involvement: Secondary | ICD-10-CM | POA: Diagnosis not present

## 2022-04-13 DIAGNOSIS — R197 Diarrhea, unspecified: Secondary | ICD-10-CM | POA: Diagnosis not present

## 2022-04-13 DIAGNOSIS — Z79899 Other long term (current) drug therapy: Secondary | ICD-10-CM | POA: Diagnosis not present

## 2022-04-13 DIAGNOSIS — M1991 Primary osteoarthritis, unspecified site: Secondary | ICD-10-CM | POA: Diagnosis not present

## 2022-04-13 DIAGNOSIS — M810 Age-related osteoporosis without current pathological fracture: Secondary | ICD-10-CM | POA: Diagnosis not present

## 2022-04-13 DIAGNOSIS — D638 Anemia in other chronic diseases classified elsewhere: Secondary | ICD-10-CM | POA: Diagnosis not present

## 2022-04-13 DIAGNOSIS — Z681 Body mass index (BMI) 19 or less, adult: Secondary | ICD-10-CM | POA: Diagnosis not present

## 2022-04-14 ENCOUNTER — Other Ambulatory Visit: Payer: Self-pay | Admitting: *Deleted

## 2022-04-14 ENCOUNTER — Inpatient Hospital Stay: Payer: Medicare PPO | Attending: Hematology and Oncology

## 2022-04-14 ENCOUNTER — Inpatient Hospital Stay: Payer: Medicare PPO

## 2022-04-14 ENCOUNTER — Other Ambulatory Visit: Payer: Self-pay

## 2022-04-14 DIAGNOSIS — I4891 Unspecified atrial fibrillation: Secondary | ICD-10-CM | POA: Insufficient documentation

## 2022-04-14 DIAGNOSIS — D638 Anemia in other chronic diseases classified elsewhere: Secondary | ICD-10-CM

## 2022-04-14 DIAGNOSIS — Z7901 Long term (current) use of anticoagulants: Secondary | ICD-10-CM | POA: Diagnosis not present

## 2022-04-14 DIAGNOSIS — N1832 Chronic kidney disease, stage 3b: Secondary | ICD-10-CM | POA: Insufficient documentation

## 2022-04-14 DIAGNOSIS — D563 Thalassemia minor: Secondary | ICD-10-CM | POA: Insufficient documentation

## 2022-04-14 DIAGNOSIS — D631 Anemia in chronic kidney disease: Secondary | ICD-10-CM | POA: Insufficient documentation

## 2022-04-14 DIAGNOSIS — D598 Other acquired hemolytic anemias: Secondary | ICD-10-CM

## 2022-04-14 LAB — CBC WITH DIFFERENTIAL (CANCER CENTER ONLY)
Abs Immature Granulocytes: 0.06 10*3/uL (ref 0.00–0.07)
Basophils Absolute: 0 10*3/uL (ref 0.0–0.1)
Basophils Relative: 0 %
Eosinophils Absolute: 0.1 10*3/uL (ref 0.0–0.5)
Eosinophils Relative: 1 %
HCT: 24.3 % — ABNORMAL LOW (ref 39.0–52.0)
Hemoglobin: 7.8 g/dL — ABNORMAL LOW (ref 13.0–17.0)
Immature Granulocytes: 1 %
Lymphocytes Relative: 5 %
Lymphs Abs: 0.4 10*3/uL — ABNORMAL LOW (ref 0.7–4.0)
MCH: 24.8 pg — ABNORMAL LOW (ref 26.0–34.0)
MCHC: 32.1 g/dL (ref 30.0–36.0)
MCV: 77.4 fL — ABNORMAL LOW (ref 80.0–100.0)
Monocytes Absolute: 0.4 10*3/uL (ref 0.1–1.0)
Monocytes Relative: 5 %
Neutro Abs: 6.7 10*3/uL (ref 1.7–7.7)
Neutrophils Relative %: 88 %
Platelet Count: 91 10*3/uL — ABNORMAL LOW (ref 150–400)
RBC: 3.14 MIL/uL — ABNORMAL LOW (ref 4.22–5.81)
RDW: 23.6 % — ABNORMAL HIGH (ref 11.5–15.5)
WBC Count: 7.7 10*3/uL (ref 4.0–10.5)
nRBC: 0.3 % — ABNORMAL HIGH (ref 0.0–0.2)

## 2022-04-14 LAB — CMP (CANCER CENTER ONLY)
ALT: 24 U/L (ref 0–44)
AST: 24 U/L (ref 15–41)
Albumin: 3.9 g/dL (ref 3.5–5.0)
Alkaline Phosphatase: 45 U/L (ref 38–126)
Anion gap: 7 (ref 5–15)
BUN: 30 mg/dL — ABNORMAL HIGH (ref 8–23)
CO2: 28 mmol/L (ref 22–32)
Calcium: 8.3 mg/dL — ABNORMAL LOW (ref 8.9–10.3)
Chloride: 109 mmol/L (ref 98–111)
Creatinine: 1.91 mg/dL — ABNORMAL HIGH (ref 0.61–1.24)
GFR, Estimated: 34 mL/min — ABNORMAL LOW (ref >60)
Glucose, Bld: 99 mg/dL (ref 70–99)
Potassium: 3.4 mmol/L — ABNORMAL LOW (ref 3.5–5.1)
Sodium: 144 mmol/L (ref 135–145)
Total Bilirubin: 0.7 mg/dL (ref 0.3–1.2)
Total Protein: 5.9 g/dL — ABNORMAL LOW (ref 6.5–8.1)

## 2022-04-14 LAB — FERRITIN: Ferritin: 194 ng/mL (ref 24–336)

## 2022-04-14 LAB — SAMPLE TO BLOOD BANK

## 2022-04-14 LAB — PREPARE RBC (CROSSMATCH)

## 2022-04-14 LAB — VITAMIN B12: Vitamin B-12: 589 pg/mL (ref 180–914)

## 2022-04-14 MED ORDER — ACETAMINOPHEN 325 MG PO TABS
650.0000 mg | ORAL_TABLET | Freq: Once | ORAL | Status: AC
  Administered 2022-04-14: 650 mg via ORAL

## 2022-04-14 MED ORDER — DIPHENHYDRAMINE HCL 25 MG PO CAPS
25.0000 mg | ORAL_CAPSULE | Freq: Once | ORAL | Status: AC
  Administered 2022-04-14: 25 mg via ORAL

## 2022-04-14 MED ORDER — SODIUM CHLORIDE 0.9% IV SOLUTION
250.0000 mL | Freq: Once | INTRAVENOUS | Status: AC
  Administered 2022-04-14: 250 mL via INTRAVENOUS

## 2022-04-14 NOTE — Patient Instructions (Signed)

## 2022-04-14 NOTE — Progress Notes (Signed)
Pt Hgb 7.8.  verbal orders received from MD for pt to receive 1 unit PRBC's today.  Orders placed.

## 2022-04-15 LAB — TYPE AND SCREEN
ABO/RH(D): A POS
Antibody Screen: NEGATIVE
Unit division: 0

## 2022-04-15 LAB — BPAM RBC
Blood Product Expiration Date: 202308282359
ISSUE DATE / TIME: 202308111102
Unit Type and Rh: 6200

## 2022-04-18 DIAGNOSIS — H353211 Exudative age-related macular degeneration, right eye, with active choroidal neovascularization: Secondary | ICD-10-CM | POA: Diagnosis not present

## 2022-04-25 ENCOUNTER — Ambulatory Visit: Payer: Medicare PPO | Admitting: Internal Medicine

## 2022-04-25 ENCOUNTER — Encounter: Payer: Self-pay | Admitting: Internal Medicine

## 2022-04-25 VITALS — BP 110/60 | HR 48 | Ht 63.0 in | Wt 105.0 lb

## 2022-04-25 DIAGNOSIS — I480 Paroxysmal atrial fibrillation: Secondary | ICD-10-CM

## 2022-04-25 DIAGNOSIS — I4892 Unspecified atrial flutter: Secondary | ICD-10-CM | POA: Diagnosis not present

## 2022-04-25 NOTE — Patient Instructions (Addendum)
Medication Instructions:  Your physician has recommended you make the following change in your medication:   STOP TAKING / DISCONTINUE:  AMIODARONE ( 200 MG ) TODAY, 04/25/22.    Lab Work: None ordered.  If you have labs (blood work) drawn today and your tests are completely normal, you will receive your results only by: [place] (if you have MyChart) OR A paper copy in the mail If you have any lab test that is abnormal or we need to change your treatment, we will call you to review the results.  Testing/Procedures: None ordered.  Follow-Up:   DATES AVAILABLE FOR AV NODE ABLATION AND MEDTRONIC PACEMAKER IMPLANT PROCEDURE:  SEPT: 8, 11, 18, 22  OCT: 2, 4, 9, 18, [place] OR CALL [phone].    Important Information About Sugar       Pacemaker Implantation, Adult Pacemaker implantation is a procedure to place a pacemaker inside the chest. A pacemaker is a small computer that sends electrical signals to the heart and helps the heart beat normally. A pacemaker also stores information about heart rhythms. You may need pacemaker implantation if you have: A slow heartbeat (bradycardia). Loss of consciousness that happens repeatedly (syncope) or repeated episodes of dizziness or light-headedness because of an irregular heart rate. Shortness of breath (dyspnea) due to heart problems. The pacemaker usually attaches to your heart through a wire called a lead. One or two leads may be needed. There are different types of pacemakers: Transvenous pacemaker. This type is placed under the skin or muscle of your upper chest area. The lead goes through a vein in the chest area to reach the inside of the heart. Epicardial pacemaker. This type is placed under the skin or muscle of your chest or abdomen. The lead goes through your chest to the outside of the heart. Tell a health care provider about: Any allergies you have. All medicines  you are taking, including vitamins, herbs, eye drops, creams, and over-the-counter medicines. Any problems you or family members have had with anesthetic medicines. Any blood or bone disorders you have. Any surgeries you have had. Any medical conditions you have. Whether you are pregnant or may be pregnant. What are the risks? Generally, this is a safe procedure. However, problems may occur, including: Infection. Bleeding. Failure of the pacemaker or the lead. Collapse of a lung or bleeding into a lung. Blood clot inside a blood vessel with a lead. Damage to the heart. Infection inside the heart (endocarditis). Allergic reactions to medicines. What happens before the procedure? Staying hydrated Follow instructions from your health care provider about hydration, which may include: Up to 2 hours before the procedure - you may continue to drink clear liquids, such as water, clear fruit juice, black coffee, and plain tea.  Eating and drinking restrictions Follow instructions from your health care provider about eating and drinking, which may include: 8 hours before the procedure - stop eating heavy meals or foods, such as meat, fried foods, or fatty foods. 6 hours before the procedure - stop eating light meals or foods, such as toast or cereal. 6 hours before the procedure - stop drinking milk or drinks that contain milk. 2 hours before the procedure - stop drinking clear liquids. Medicines Ask your health care provider about: Changing or stopping your regular medicines. This is especially important if you are taking diabetes medicines or blood thinners. Taking medicines such as aspirin and ibuprofen. These medicines can thin your  blood. Do not take these medicines unless your health care provider tells you to take them. Taking over-the-counter medicines, vitamins, herbs, and supplements. Tests You may have: A heart evaluation. This may include: An electrocardiogram (ECG). This  involves placing patches on your skin to check your heart rhythm. A chest X-ray. An echocardiogram. This is a test that uses sound waves (ultrasound) to produce an image of the heart. A cardiac rhythm monitor. This is used to record your heart rhythm and any events for a longer period of time. Blood tests. Genetic testing. General instructions Do not use any products that contain nicotine or tobacco for at least 4 weeks before the procedure. These products include cigarettes, e-cigarettes, and chewing tobacco. If you need help quitting, ask your health care provider. Ask your health care provider: How your surgery site will be marked. What steps will be taken to help prevent infection. These steps may include: Removing hair at the surgery site. Washing skin with a germ-killing soap. Receiving antibiotic medicine. Plan to have someone take you home from the hospital or clinic. If you will be going home right after the procedure, plan to have someone with you for 24 hours. What happens during the procedure? An IV will be inserted into one of your veins. You will be given one or more of the following: A medicine to help you relax (sedative). A medicine to numb the area (local anesthetic). A medicine to make you fall asleep (general anesthetic). The next steps vary depending on the type of pacemaker you will be getting. If you are getting a transvenous pacemaker: An incision will be made in your upper chest. A pocket will be made for the pacemaker. It may be placed under the skin or between layers of muscle. The lead will be inserted into a blood vessel that goes to the heart. While X-rays are taken by an imaging machine (fluoroscopy), the lead will be advanced through the vein to the inside of your heart. The other end of the lead will be tunneled under the skin and attached to the pacemaker. If you are getting an epicardial pacemaker: An incision will be made near your ribs or breastbone  (sternum) for the lead. The lead will be attached to the outside of your heart. Another incision will be made in your chest or upper abdomen to create a pocket for the pacemaker. The free end of the lead will be tunneled under the skin and attached to the pacemaker. The transvenous or epicardial pacemaker will be tested. Imaging studies may be done to check the lead position. The incisions will be closed with stitches (sutures), adhesive strips, or skin glue. Bandages (dressings) will be placed over the incisions. The procedure may vary among health care providers and hospitals. What happens after the procedure? Your blood pressure, heart rate, breathing rate, and blood oxygen level will be monitored until you leave the hospital or clinic. You may be given antibiotics. You will be given pain medicine. An ECG and chest X-rays will be done. You may need to wear a continuous type of ECG (Holter monitor) to check your heart rhythm. Your health care provider will program the pacemaker. If you were given a sedative during the procedure, it can affect you for several hours. Do not drive or operate machinery until your health care provider says that it is safe. You will be given a pacemaker identification card. This card lists the implant date, device model, and manufacturer of your pacemaker. Summary A pacemaker  is a small computer that sends electrical signals to the heart and helps the heart beat normally. There are different types of pacemakers. A pacemaker may be placed under the skin or muscle of your chest or abdomen. Follow instructions from your health care provider about eating and drinking and about taking medicines before the procedure. This information is not intended to replace advice given to you by your health care provider. Make sure you discuss any questions you have with your health care provider. Document Revised: 05/03/2021 Document Reviewed: 07/23/2019 [employer] Patient Education   [employer].

## 2022-04-25 NOTE — Progress Notes (Signed)
HPI Joshua Hudson returns today for followup. He is a pleasant 87 yo man with a h/o atrial fib and atypical flutter with a RVR. He underwent initial DCCV and had ERAF. He was placed on amio and underwent repeat DCCV and has maintained NSR on amiodarone. Despite maintaining NSR, he feels poorly since taking the amiodarone. No syncope. He has a decreased appetite and dizziness and malaise. All started with amiodarone. He has not had more palpitations since his last DCCV.  No Known Allergies   Current Outpatient Medications  Medication Sig Dispense Refill   apixaban (ELIQUIS) 2.5 MG TABS tablet Take 1 tablet (2.5 mg total) by mouth 2 (two) times daily. 180 tablet 1   diphenoxylate-atropine (LOMOTIL) 2.5-0.025 MG tablet Take 1 tablet by mouth 3 (three) times daily as needed for diarrhea or loose stools.     epoetin alfa-epbx (RETACRIT) 62376 UNIT/ML injection Inject 40,000 Units into the skin every 6 (six) weeks.     finasteride (PROSCAR) 5 MG tablet Take 5 mg by mouth daily.     fluticasone (FLONASE) 50 MCG/ACT nasal spray Place 1 spray into both nostrils daily as needed for allergies or rhinitis.     ketotifen (ZADITOR) 0.025 % ophthalmic solution Place 1 drop into both eyes 2 (two) times daily as needed (itchy eyes).     levothyroxine (SYNTHROID) 88 MCG tablet Take 88 mcg by mouth every morning.     mineral oil-hydrophilic petrolatum (AQUAPHOR) ointment Apply 1 Application topically as needed (wound care).     Multiple Vitamin (MULTIVITAMIN WITH MINERALS) TABS tablet Take 1 tablet by mouth daily.     pantoprazole (PROTONIX) 40 MG tablet Take 1 tablet (40 mg total) by mouth 2 (two) times daily before a meal for 30 days, THEN 1 tablet (40 mg total) daily. (Patient taking differently: Take 1 tablet (40 mg total) daily.) 120 tablet 0   potassium chloride SA (KLOR-CON) 20 MEQ tablet Take 1 tablet (20 mEq total) by mouth daily. NEED OV. 90 tablet 0   predniSONE (DELTASONE) 5 MG tablet Take 5 mg by  mouth daily with breakfast.      torsemide (DEMADEX) 20 MG tablet Take 1 tablet (20 mg total) by mouth daily. 30 tablet 0   No current facility-administered medications for this visit.     Past Medical History:  Diagnosis Date   Achalasia 06/12/2019   Noted on EGD   Aortic insufficiency 03/24/2019   AI of bioprosthetic AVR severe 4 plus   Chronic diastolic CHF (congestive heart failure) ([place]) 03/24/2019   Colon polyps    s/p diverticular perforation requiring 2-stage repair   Derangement of right shoulder joint    need replacing has no use of   Diverticulosis    DJD (degenerative joint disease), lumbosacral    Dysphagia    eats soft food   Esophageal stricture    GERD (gastroesophageal reflux disease)    Hemolytic anemia ([place])    History of blood transfusion given with 04-15-19 surgery   History of blood transfusion 07/18/2019   History of kidney stones    passed stones   Hypertension    Mitral regurgitation    moderate   Occipital neuralgia    Pancytopenia (HCC)    Postoperative atrial fibrillation ([place]) 05/10/2015   Prosthetic valve dysfunction    Rheumatoid arthritis (HCC)    s/o long term steroids  shoulders and hands   Rotator cuff arthropathy    right   S/P aortic valve replacement  with bioprosthetic valve 01/16/2008   74m Edwards Magna perimount bovine pericardial tissue valve, model 3000   S/P valve-in-valve TAVR 04/15/2019   26 mm Edwards Sapien 3 Ultra transcatheter heart valve placed via percutaneous right transfemoral approach    SBE (subacute bacterial endocarditis) prophylaxis candidate    for dental procedures   Schatzki's ring 06/12/2019   Narrowing, Noted on EGD   Severe aortic stenosis    S/P prosthetic valve replacement w 25 mm Edwards like science percardial tissue valve,[place] - 01/2008   Thrombocytopenia (HCC)     ROS:   All systems reviewed and negative except as noted in the HPI.   Past Surgical History:  Procedure Laterality Date    A-FLUTTER ABLATION N/A 10/10/2019   Procedure: A-FLUTTER ABLATION;  Surgeon: TEvans Lance MD;  [facility];  Service: Cardiovascular;  Laterality: N/A;   APPENDECTOMY     BALLOON DILATION N/A 06/12/2019   Procedure: BALLOON DILATION;  Surgeon: KRonnette Juniper MD;  [facility];  Service: Gastroenterology;  Laterality: N/A;   BOTOX INJECTION N/A 01/22/2018   Procedure: BOTOX INJECTION;  Surgeon: KRonnette Juniper MD;  [facility];  Service: Gastroenterology;  Laterality: N/A;   CARDIAC CATHETERIZATION     09   CARDIAC VALVE REPLACEMENT  01/2008   aortic valve replacement   CARDIOVERSION N/A 09/25/2019   Procedure: CARDIOVERSION;  Surgeon: SJerline Pain MD;  [place] [facility]  Service: Cardiovascular;  Laterality: N/A;   CARDIOVERSION N/A 12/25/2019   Procedure: CARDIOVERSION;  Surgeon: CLelon Perla MD;  [place] [facility]  Service: Cardiovascular;  Laterality: N/A;   CARDIOVERSION N/A 03/08/2022   Procedure: CARDIOVERSION;  Surgeon: PFreada Bergeron MD;  [facility];  Service: Cardiovascular;  Laterality: N/A;   CARDIOVERSION N/A 03/28/2022   Procedure: CARDIOVERSION;  Surgeon: TBerniece Salines DO;  [facility]  Service: Cardiovascular;  Laterality: N/A;   CATARACT EXTRACTION W/ INTRAOCULAR LENS  IMPLANT, BILATERAL     COLON RESECTION     diverticulitis    COLONOSCOPY N/A 03/03/2022   Procedure: COLONOSCOPY;  Surgeon: BOtis Brace MD;  [facility];  Service: Gastroenterology;  Laterality: N/A;   CORONARY ANGIOPLASTY     dental implants     permanent   ENTEROSCOPY N/A 03/03/2022   Procedure: ENTEROSCOPY;  Surgeon: BOtis Brace MD;  [facility];  Service: Gastroenterology;  Laterality: N/A;   ESOPHAGEAL MANOMETRY N/A 11/07/2017   Procedure: ESOPHAGEAL MANOMETRY (EM);  Surgeon: KRonnette Juniper MD;  [facility];  Service: Gastroenterology;  Laterality: N/A;   ESOPHAGOGASTRODUODENOSCOPY  N/A 02/24/2022   Procedure: ESOPHAGOGASTRODUODENOSCOPY (EGD);  Surgeon: OArta Silence MD;  [facility];  Service: Gastroenterology;  Laterality: N/A;   ESOPHAGOGASTRODUODENOSCOPY (EGD) WITH PROPOFOL N/A 01/22/2018   Procedure: ESOPHAGOGASTRODUODENOSCOPY (EGD) WITH PROPOFOL;  Surgeon: KRonnette Juniper MD;  [facility];  Service: Gastroenterology;  Laterality: N/A;   ESOPHAGOGASTRODUODENOSCOPY (EGD) WITH PROPOFOL N/A 04/30/2019   Procedure: ESOPHAGOGASTRODUODENOSCOPY (EGD) WITH PROPOFOL;  Surgeon: ELaurence Spates MD;  [place] [facility]  Service: Endoscopy;  Laterality: N/A;   ESOPHAGOGASTRODUODENOSCOPY (EGD) WITH PROPOFOL N/A 06/12/2019   Procedure: ESOPHAGOGASTRODUODENOSCOPY (EGD) WITH PROPOFOL;  Surgeon: KRonnette Juniper MD;  [facility];  Service: Gastroenterology;  Laterality: N/A;  with botox injection   EXCISIONAL TOTAL SHOULDER ARTHROPLASTY WITH ANTIBIOTIC SPACER Right 12/31/2018   Procedure: EXCISIONAL TOTAL SHOULDER ARTHROPLASTY WITH ANTIBIOTIC SPACER;  Surgeon: NNetta Cedars MD;  [place] [facility]  Service: Orthopedics;  Laterality: Right;   GIVENS CAPSULE STUDY N/A  03/01/2022   Procedure: GIVENS CAPSULE STUDY;  Surgeon: Otis Brace, MD;  [facility];  Service: Gastroenterology;  Laterality: N/A;   HEMOSTASIS CLIP PLACEMENT  03/03/2022   Procedure: HEMOSTASIS CLIP PLACEMENT;  Surgeon: Otis Brace, MD;  [facility];  Service: Gastroenterology;;   HERNIA REPAIR     HOT HEMOSTASIS N/A 03/03/2022   Procedure: HOT HEMOSTASIS (ARGON PLASMA COAGULATION/BICAP);  Surgeon: Otis Brace, MD;  [facility];  Service: Gastroenterology;  Laterality: N/A;  EGD and COLON   INTRAOPERATIVE TRANSTHORACIC ECHOCARDIOGRAM N/A 04/15/2019   Procedure: Intraoperative Transthoracic Echocardiogram;  Surgeon: Sherren Mocha, MD;  Location: [place];  Service: Open Heart Surgery;  Laterality: N/A;   IRRIGATION AND DEBRIDEMENT SHOULDER Right  11/20/2018    IRRIGATION AND DEBRIDEMENT SHOULDER WITH POLY EXCHANGE (Right Shoulder)   IRRIGATION AND DEBRIDEMENT SHOULDER Right 11/20/2018   Procedure: IRRIGATION AND DEBRIDEMENT SHOULDER WITH POLY EXCHANGE;  Surgeon: Netta Cedars, MD;  Location: [place];  Service: Orthopedics;  Laterality: Right;   LUMBAR LAMINECTOMY     x 2   POLYPECTOMY  03/03/2022   Procedure: POLYPECTOMY;  Surgeon: Otis Brace, MD;  [facility];  Service: Gastroenterology;;   REVERSE SHOULDER ARTHROPLASTY Right 03/01/2018   Procedure: RIGHT REVERSE SHOULDER ARTHROPLASTY;  Surgeon: Netta Cedars, MD;  Location: [place];  Service: Orthopedics;  Laterality: Right;   REVERSE SHOULDER ARTHROPLASTY Right 07/18/2019   Procedure: REVERSE TOTAL SHOULDER ARTHROPLASTY and removal of antiobotic spacer;  Surgeon: Netta Cedars, MD;  [facility];  Service: Orthopedics;  Laterality: Right;  interscalene block   REVERSE SHOULDER ARTHROPLASTY Right 08/06/2019   Procedure: Reduction of dislocated reverse total shoulder and poly exchange;  Surgeon: Netta Cedars, MD;  [facility];  Service: Orthopedics;  Laterality: Right;  need 1 hour   RIGHT/LEFT HEART CATH AND CORONARY ANGIOGRAPHY N/A 04/02/2019   Procedure: RIGHT/LEFT HEART CATH AND CORONARY ANGIOGRAPHY;  Surgeon: Leonie Man, MD;  Location: [place] [facility];  Service: Cardiovascular;  Laterality: N/A;   SAVORY DILATION N/A 01/22/2018   Procedure: SAVORY DILATION;  Surgeon: Ronnette Juniper, MD;  [facility];  Service: Gastroenterology;  Laterality: N/A;   SAVORY DILATION N/A 04/30/2019   Procedure: SAVORY DILATION;  Surgeon: Laurence Spates, MD;  Location: [place];  Service: Endoscopy;  Laterality: N/A;  With fluro   SHOULDER HEMI-ARTHROPLASTY Right 06/14/2018   Procedure: RIGHT  REVERSE TOTAL SHOULDER OPEN POLY EXCHANGE;  Surgeon: Netta Cedars, MD;  Location: [place];  Service: Orthopedics;  Laterality: Right;   SUBMUCOSAL INJECTION  06/12/2019    Procedure: SUBMUCOSAL INJECTION;  Surgeon: Ronnette Juniper, MD;  [facility];  Service: Gastroenterology;;   TEE WITHOUT CARDIOVERSION N/A 01/29/2014   Procedure: TRANSESOPHAGEAL ECHOCARDIOGRAM (TEE);  Surgeon: Sueanne Margarita, MD;  Location: [facility];  Service: Cardiovascular;  Laterality: N/A;   TEE WITHOUT CARDIOVERSION N/A 04/02/2019   Procedure: TRANSESOPHAGEAL ECHOCARDIOGRAM (TEE);  Surgeon: Josue Hector, MD;  [facility];  Service: Cardiovascular;  Laterality: N/A;   TEE WITHOUT CARDIOVERSION N/A 09/25/2019   Procedure: TRANSESOPHAGEAL ECHOCARDIOGRAM (TEE);  Surgeon: Jerline Pain, MD;  [facility];  Service: Cardiovascular;  Laterality: N/A;   TEE WITHOUT CARDIOVERSION N/A 03/08/2022   Procedure: TRANSESOPHAGEAL ECHOCARDIOGRAM (TEE);  Surgeon: [name], MD;  Location: [place];  Service: Cardiovascular;  Laterality: N/A;   TONSILLECTOMY     TRANSCATHETER AORTIC VALVE REPLACEMENT, TRANSFEMORAL  04/15/2019   TRANSCATHETER AORTIC VALVE REPLACEMENT, TRANSFEMORAL N/A 04/15/2019   Procedure: TRANSCATHETER AORTIC VALVE REPLACEMENT, TRANSFEMORAL  with POST BALLOON DILATION;  Surgeon: Sherren Mocha, MD;  Location: [place];  Service: Open Heart Surgery;  Laterality: N/A;     Family History  Problem Relation Age of Onset   Other Mother        NO HEALTH PROBLEMS   Heart disease Father    Arthritis Father    Heart Problems Sister        RELATED TO A MVA   Suicidality Brother    Other Sister        3 SISTERS IN GOOD HEALTH   Other Brother        2 BROTHERS IN GOOD HEALTH   Other Daughter        2 IN GOOD HEALTH     Social History   Socioeconomic History   Marital status: Widowed    Spouse name: Not on file   Number of children: 2   Years of education: Not on file   Highest education level: Not on file  Occupational History   Occupation: Retired Market researcher at [employer] Use   Smoking status: Former    Packs/day: 0.50    Years: 10.00     Total pack years: 5.00    Types: Cigarettes    Start date: 01/16/1974    Quit date: 09/05/1983    Years since quitting: 38.6   Smokeless tobacco: Former    Types: Nurse, children's Use: Never used  Substance and Sexual Activity   Alcohol use: Not Currently    Alcohol/week: 0.0 standard drinks of alcohol   Drug use: No   Sexual activity: Not on file  Other Topics Concern   Not on file  Social History Narrative   Not on file   Social Determinants of Health   Financial Resource Strain: Not on file  Food Insecurity: Not on file  Transportation Needs: Not on file  Physical Activity: Not on file  Stress: Not on file  Social Connections: Not on file  Intimate Partner Violence: Not on file     BP 110/60   Pulse (!) 48   Ht '5\' 3"'$  (1.6 m)   Wt 105 lb (47.6 kg)   SpO2 99%   BMI 18.60 kg/m   Physical Exam:  Well appearing NAD HEENT: Unremarkable Neck:  No JVD, no thyromegally Lymphatics:  No adenopathy Back:  No CVA tenderness Lungs:  Clear HEART:  Regular rate rhythm, no murmurs, no rubs, no clicks Abd:  soft, positive bowel sounds, no organomegally, no rebound, no guarding Ext:  2 plus pulses, no edema, no cyanosis, no clubbing Skin:  No rashes no nodules Neuro:  CN II through XII intact, motor grossly intact  EKG - sinus bradycardia  Assess/Plan:  Atrial fib/flutter - he is s/p DCCV and appears to be maintaining NSR. He appears to not be tolerating amiodarone despite maintaining NSR. I discussed the treatment options with the patient and recommend AV node ablation an PPM insertion. I asked him to stop his amiodarone.  Chronic diastolic heart failure - his symptoms are class 2. He will continue his current meds.  Joshua Overlie Fareeda Downard,MD

## 2022-04-25 NOTE — H&P (View-Only) (Signed)
HPI Joshua Hudson returns today for followup. He is a pleasant 87 yo man with a h/o atrial fib and atypical flutter with a RVR. He underwent initial DCCV and had ERAF. He was placed on amio and underwent repeat DCCV and has maintained NSR on amiodarone. Despite maintaining NSR, he feels poorly since taking the amiodarone. No syncope. He has a decreased appetite and dizziness and malaise. All started with amiodarone. He has not had more palpitations since his last DCCV.  No Known Allergies   Current Outpatient Medications  Medication Sig Dispense Refill   apixaban (ELIQUIS) 2.5 MG TABS tablet Take 1 tablet (2.5 mg total) by mouth 2 (two) times daily. 180 tablet 1   diphenoxylate-atropine (LOMOTIL) 2.5-0.025 MG tablet Take 1 tablet by mouth 3 (three) times daily as needed for diarrhea or loose stools.     epoetin alfa-epbx (RETACRIT) 06237 UNIT/ML injection Inject 40,000 Units into the skin every 6 (six) weeks.     finasteride (PROSCAR) 5 MG tablet Take 5 mg by mouth daily.     fluticasone (FLONASE) 50 MCG/ACT nasal spray Place 1 spray into both nostrils daily as needed for allergies or rhinitis.     ketotifen (ZADITOR) 0.025 % ophthalmic solution Place 1 drop into both eyes 2 (two) times daily as needed (itchy eyes).     levothyroxine (SYNTHROID) 88 MCG tablet Take 88 mcg by mouth every morning.     mineral oil-hydrophilic petrolatum (AQUAPHOR) ointment Apply 1 Application topically as needed (wound care).     Multiple Vitamin (MULTIVITAMIN WITH MINERALS) TABS tablet Take 1 tablet by mouth daily.     pantoprazole (PROTONIX) 40 MG tablet Take 1 tablet (40 mg total) by mouth 2 (two) times daily before a meal for 30 days, THEN 1 tablet (40 mg total) daily. (Patient taking differently: Take 1 tablet (40 mg total) daily.) 120 tablet 0   potassium chloride SA (KLOR-CON) 20 MEQ tablet Take 1 tablet (20 mEq total) by mouth daily. NEED OV. 90 tablet 0   predniSONE (DELTASONE) 5 MG tablet Take 5 mg by  mouth daily with breakfast.      torsemide (DEMADEX) 20 MG tablet Take 1 tablet (20 mg total) by mouth daily. 30 tablet 0   No current facility-administered medications for this visit.     Past Medical History:  Diagnosis Date   Achalasia 06/12/2019   Noted on EGD   Aortic insufficiency 03/24/2019   AI of bioprosthetic AVR severe 4 plus   Chronic diastolic CHF (congestive heart failure) ([place]) 03/24/2019   Colon polyps    s/p diverticular perforation requiring 2-stage repair   Derangement of right shoulder joint    need replacing has no use of   Diverticulosis    DJD (degenerative joint disease), lumbosacral    Dysphagia    eats soft food   Esophageal stricture    GERD (gastroesophageal reflux disease)    Hemolytic anemia ([place])    History of blood transfusion given with 04-15-19 surgery   History of blood transfusion 07/18/2019   History of kidney stones    passed stones   Hypertension    Mitral regurgitation    moderate   Occipital neuralgia    Pancytopenia (HCC)    Postoperative atrial fibrillation ([place]) 05/10/2015   Prosthetic valve dysfunction    Rheumatoid arthritis (HCC)    s/o long term steroids  shoulders and hands   Rotator cuff arthropathy    right   S/P aortic valve replacement  with bioprosthetic valve 01/16/2008   2m Edwards Magna perimount bovine pericardial tissue valve, model 3000   S/P valve-in-valve TAVR 04/15/2019   26 mm Edwards Sapien 3 Ultra transcatheter heart valve placed via percutaneous right transfemoral approach    SBE (subacute bacterial endocarditis) prophylaxis candidate    for dental procedures   Schatzki's ring 06/12/2019   Narrowing, Noted on EGD   Severe aortic stenosis    S/P prosthetic valve replacement w 25 mm Edwards like science percardial tissue valve,[place] - 01/2008   Thrombocytopenia (HCC)     ROS:   All systems reviewed and negative except as noted in the HPI.   Past Surgical History:  Procedure Laterality Date    A-FLUTTER ABLATION N/A 10/10/2019   Procedure: A-FLUTTER ABLATION;  Surgeon: TEvans Lance MD;  [facility];  Service: Cardiovascular;  Laterality: N/A;   APPENDECTOMY     BALLOON DILATION N/A 06/12/2019   Procedure: BALLOON DILATION;  Surgeon: KRonnette Juniper MD;  [facility];  Service: Gastroenterology;  Laterality: N/A;   BOTOX INJECTION N/A 01/22/2018   Procedure: BOTOX INJECTION;  Surgeon: KRonnette Juniper MD;  [facility];  Service: Gastroenterology;  Laterality: N/A;   CARDIAC CATHETERIZATION     09   CARDIAC VALVE REPLACEMENT  01/2008   aortic valve replacement   CARDIOVERSION N/A 09/25/2019   Procedure: CARDIOVERSION;  Surgeon: SJerline Pain MD;  [facility]  Service: Cardiovascular;  Laterality: N/A;   CARDIOVERSION N/A 12/25/2019   Procedure: CARDIOVERSION;  Surgeon: CLelon Perla MD;  [facility]  Service: Cardiovascular;  Laterality: N/A;   CARDIOVERSION N/A 03/08/2022   Procedure: CARDIOVERSION;  Surgeon: PFreada Bergeron MD;  [facility];  Service: Cardiovascular;  Laterality: N/A;   CARDIOVERSION N/A 03/28/2022   Procedure: CARDIOVERSION;  Surgeon: TBerniece Salines DO;  [facility]  Service: Cardiovascular;  Laterality: N/A;   CATARACT EXTRACTION W/ INTRAOCULAR LENS  IMPLANT, BILATERAL     COLON RESECTION     diverticulitis    COLONOSCOPY N/A 03/03/2022   Procedure: COLONOSCOPY;  Surgeon: BOtis Brace MD;  [facility];  Service: Gastroenterology;  Laterality: N/A;   CORONARY ANGIOPLASTY     dental implants     permanent   ENTEROSCOPY N/A 03/03/2022   Procedure: ENTEROSCOPY;  Surgeon: BOtis Brace MD;  [facility];  Service: Gastroenterology;  Laterality: N/A;   ESOPHAGEAL MANOMETRY N/A 11/07/2017   Procedure: ESOPHAGEAL MANOMETRY (EM);  Surgeon: KRonnette Juniper MD;  [facility];  Service: Gastroenterology;  Laterality: N/A;   ESOPHAGOGASTRODUODENOSCOPY  N/A 02/24/2022   Procedure: ESOPHAGOGASTRODUODENOSCOPY (EGD);  Surgeon: OArta Silence MD;  [facility];  Service: Gastroenterology;  Laterality: N/A;   ESOPHAGOGASTRODUODENOSCOPY (EGD) WITH PROPOFOL N/A 01/22/2018   Procedure: ESOPHAGOGASTRODUODENOSCOPY (EGD) WITH PROPOFOL;  Surgeon: KRonnette Juniper MD;  [facility];  Service: Gastroenterology;  Laterality: N/A;   ESOPHAGOGASTRODUODENOSCOPY (EGD) WITH PROPOFOL N/A 04/30/2019   Procedure: ESOPHAGOGASTRODUODENOSCOPY (EGD) WITH PROPOFOL;  Surgeon: ELaurence Spates MD;  [facility]  Service: Endoscopy;  Laterality: N/A;   ESOPHAGOGASTRODUODENOSCOPY (EGD) WITH PROPOFOL N/A 06/12/2019   Procedure: ESOPHAGOGASTRODUODENOSCOPY (EGD) WITH PROPOFOL;  Surgeon: KRonnette Juniper MD;  [facility];  Service: Gastroenterology;  Laterality: N/A;  with botox injection   EXCISIONAL TOTAL SHOULDER ARTHROPLASTY WITH ANTIBIOTIC SPACER Right 12/31/2018   Procedure: EXCISIONAL TOTAL SHOULDER ARTHROPLASTY WITH ANTIBIOTIC SPACER;  Surgeon: NNetta Cedars MD;  [facility]  Service: Orthopedics;  Laterality: Right;   GIVENS CAPSULE STUDY N/A  03/01/2022   Procedure: GIVENS CAPSULE STUDY;  Surgeon: Otis Brace, MD;  [facility];  Service: Gastroenterology;  Laterality: N/A;   HEMOSTASIS CLIP PLACEMENT  03/03/2022   Procedure: HEMOSTASIS CLIP PLACEMENT;  Surgeon: Otis Brace, MD;  [facility];  Service: Gastroenterology;;   HERNIA REPAIR     HOT HEMOSTASIS N/A 03/03/2022   Procedure: HOT HEMOSTASIS (ARGON PLASMA COAGULATION/BICAP);  Surgeon: Otis Brace, MD;  [facility];  Service: Gastroenterology;  Laterality: N/A;  EGD and COLON   INTRAOPERATIVE TRANSTHORACIC ECHOCARDIOGRAM N/A 04/15/2019   Procedure: Intraoperative Transthoracic Echocardiogram;  Surgeon: Sherren Mocha, MD;  Location: [place];  Service: Open Heart Surgery;  Laterality: N/A;   IRRIGATION AND DEBRIDEMENT SHOULDER Right  11/20/2018    IRRIGATION AND DEBRIDEMENT SHOULDER WITH POLY EXCHANGE (Right Shoulder)   IRRIGATION AND DEBRIDEMENT SHOULDER Right 11/20/2018   Procedure: IRRIGATION AND DEBRIDEMENT SHOULDER WITH POLY EXCHANGE;  Surgeon: Netta Cedars, MD;  Location: [place];  Service: Orthopedics;  Laterality: Right;   LUMBAR LAMINECTOMY     x 2   POLYPECTOMY  03/03/2022   Procedure: POLYPECTOMY;  Surgeon: Otis Brace, MD;  [facility];  Service: Gastroenterology;;   REVERSE SHOULDER ARTHROPLASTY Right 03/01/2018   Procedure: RIGHT REVERSE SHOULDER ARTHROPLASTY;  Surgeon: Netta Cedars, MD;  Location: [place];  Service: Orthopedics;  Laterality: Right;   REVERSE SHOULDER ARTHROPLASTY Right 07/18/2019   Procedure: REVERSE TOTAL SHOULDER ARTHROPLASTY and removal of antiobotic spacer;  Surgeon: Netta Cedars, MD;  [facility];  Service: Orthopedics;  Laterality: Right;  interscalene block   REVERSE SHOULDER ARTHROPLASTY Right 08/06/2019   Procedure: Reduction of dislocated reverse total shoulder and poly exchange;  Surgeon: Netta Cedars, MD;  [facility];  Service: Orthopedics;  Laterality: Right;  need 1 hour   RIGHT/LEFT HEART CATH AND CORONARY ANGIOGRAPHY N/A 04/02/2019   Procedure: RIGHT/LEFT HEART CATH AND CORONARY ANGIOGRAPHY;  Surgeon: Leonie Man, MD;  Location: [place] [facility];  Service: Cardiovascular;  Laterality: N/A;   SAVORY DILATION N/A 01/22/2018   Procedure: SAVORY DILATION;  Surgeon: Ronnette Juniper, MD;  [facility];  Service: Gastroenterology;  Laterality: N/A;   SAVORY DILATION N/A 04/30/2019   Procedure: SAVORY DILATION;  Surgeon: Laurence Spates, MD;  Location: [place];  Service: Endoscopy;  Laterality: N/A;  With fluro   SHOULDER HEMI-ARTHROPLASTY Right 06/14/2018   Procedure: RIGHT  REVERSE TOTAL SHOULDER OPEN POLY EXCHANGE;  Surgeon: Netta Cedars, MD;  Location: [place];  Service: Orthopedics;  Laterality: Right;   SUBMUCOSAL INJECTION  06/12/2019    Procedure: SUBMUCOSAL INJECTION;  Surgeon: Ronnette Juniper, MD;  [facility];  Service: Gastroenterology;;   TEE WITHOUT CARDIOVERSION N/A 01/29/2014   Procedure: TRANSESOPHAGEAL ECHOCARDIOGRAM (TEE);  Surgeon: Sueanne Margarita, MD;  Location[facility];  Service: Cardiovascular;  Laterality: N/A;   TEE WITHOUT CARDIOVERSION N/A 04/02/2019   Procedure: TRANSESOPHAGEAL ECHOCARDIOGRAM (TEE);  Surgeon: Josue Hector, MD;  Location: [facility];  Service: Cardiovascular;  Laterality: N/A;   TEE WITHOUT CARDIOVERSION N/A 09/25/2019   Procedure: TRANSESOPHAGEAL ECHOCARDIOGRAM (TEE);  Surgeon: Jerline Pain, MD;  Location[facility];  Service: Cardiovascular;  Laterality: N/A;   TEE WITHOUT CARDIOVERSION N/A 03/08/2022   Procedure: TRANSESOPHAGEAL ECHOCARDIOGRAM (TEE);  Surgeon: [name], MD;  Location: [place];  Service: Cardiovascular;  Laterality: N/A;   TONSILLECTOMY     TRANSCATHETER AORTIC VALVE REPLACEMENT, TRANSFEMORAL  04/15/2019   TRANSCATHETER AORTIC VALVE REPLACEMENT, TRANSFEMORAL N/A 04/15/2019   Procedure: TRANSCATHETER AORTIC VALVE REPLACEMENT, TRANSFEMORAL  with POST BALLOON DILATION;  Surgeon: Sherren Mocha, MD;  Location: [place];  Service: Open Heart Surgery;  Laterality: N/A;     Family History  Problem Relation Age of Onset   Other Mother        NO HEALTH PROBLEMS   Heart disease Father    Arthritis Father    Heart Problems Sister        RELATED TO A MVA   Suicidality Brother    Other Sister        3 SISTERS IN GOOD HEALTH   Other Brother        2 BROTHERS IN GOOD HEALTH   Other Daughter        2 IN GOOD HEALTH     Social History   Socioeconomic History   Marital status: Widowed    Spouse name: Not on file   Number of children: 2   Years of education: Not on file   Highest education level: Not on file  Occupational History   Occupation: Retired Market researcher at [employer] Use   Smoking status: Former    Packs/day: 0.50    Years: 10.00     Total pack years: 5.00    Types: Cigarettes    Start date: 01/16/1974    Quit date: 09/05/1983    Years since quitting: 38.6   Smokeless tobacco: Former    Types: Nurse, children's Use: Never used  Substance and Sexual Activity   Alcohol use: Not Currently    Alcohol/week: 0.0 standard drinks of alcohol   Drug use: No   Sexual activity: Not on file  Other Topics Concern   Not on file  Social History Narrative   Not on file   Social Determinants of Health   Financial Resource Strain: Not on file  Food Insecurity: Not on file  Transportation Needs: Not on file  Physical Activity: Not on file  Stress: Not on file  Social Connections: Not on file  Intimate Partner Violence: Not on file     BP 110/60   Pulse (!) 48   Ht '5\' 3"'$  (1.6 m)   Wt 105 lb (47.6 kg)   SpO2 99%   BMI 18.60 kg/m   Physical Exam:  Well appearing NAD HEENT: Unremarkable Neck:  No JVD, no thyromegally Lymphatics:  No adenopathy Back:  No CVA tenderness Lungs:  Clear HEART:  Regular rate rhythm, no murmurs, no rubs, no clicks Abd:  soft, positive bowel sounds, no organomegally, no rebound, no guarding Ext:  2 plus pulses, no edema, no cyanosis, no clubbing Skin:  No rashes no nodules Neuro:  CN II through XII intact, motor grossly intact  EKG - sinus bradycardia  Assess/Plan:  Atrial fib/flutter - he is s/p DCCV and appears to be maintaining NSR. He appears to not be tolerating amiodarone despite maintaining NSR. I discussed the treatment options with the patient and recommend AV node ablation an PPM insertion. I asked him to stop his amiodarone.  Chronic diastolic heart failure - his symptoms are class 2. He will continue his current meds.  Joshua Overlie Caide Campi,MD

## 2022-04-26 ENCOUNTER — Telehealth (INDEPENDENT_AMBULATORY_CARE_PROVIDER_SITE_OTHER): Payer: Self-pay

## 2022-05-02 NOTE — Progress Notes (Signed)
Patient Care Team: Lavone Orn, MD as PCP - General (Internal Medicine) Sueanne Margarita, MD as PCP - Cardiology (Cardiology) Sueanne Margarita, MD as Consulting Physician (Cardiology) Nicholas Lose, MD as Consulting Physician (Hematology and Oncology)  DIAGNOSIS: No diagnosis found.  SUMMARY OF ONCOLOGIC HISTORY: Oncology History   No history exists.    CHIEF COMPLIANT: Follow-up of anemia, thrombocytopenia  INTERVAL HISTORY: Joshua Hudson is a 87 y.o. with above-mentioned history of anemia and thrombocytopenia currently on treatment with Aranesp. He presents to the clinic today for labs and a follow-up.    ALLERGIES:  has No Known Allergies.  MEDICATIONS:  Current Outpatient Medications  Medication Sig Dispense Refill   apixaban (ELIQUIS) 2.5 MG TABS tablet Take 1 tablet (2.5 mg total) by mouth 2 (two) times daily. 180 tablet 1   diphenoxylate-atropine (LOMOTIL) 2.5-0.025 MG tablet Take 1 tablet by mouth 3 (three) times daily as needed for diarrhea or loose stools.     epoetin alfa-epbx (RETACRIT) 46962 UNIT/ML injection Inject 40,000 Units into the skin every 6 (six) weeks.     finasteride (PROSCAR) 5 MG tablet Take 5 mg by mouth daily.     fluticasone (FLONASE) 50 MCG/ACT nasal spray Place 1 spray into both nostrils daily as needed for allergies or rhinitis.     ketotifen (ZADITOR) 0.025 % ophthalmic solution Place 1 drop into both eyes 2 (two) times daily as needed (itchy eyes).     levothyroxine (SYNTHROID) 88 MCG tablet Take 88 mcg by mouth every morning.     mineral oil-hydrophilic petrolatum (AQUAPHOR) ointment Apply 1 Application topically as needed (wound care).     Multiple Vitamin (MULTIVITAMIN WITH MINERALS) TABS tablet Take 1 tablet by mouth daily.     pantoprazole (PROTONIX) 40 MG tablet Take 1 tablet (40 mg total) by mouth 2 (two) times daily before a meal for 30 days, THEN 1 tablet (40 mg total) daily. (Patient taking differently: Take 1 tablet (40 mg total)  daily.) 120 tablet 0   potassium chloride SA (KLOR-CON) 20 MEQ tablet Take 1 tablet (20 mEq total) by mouth daily. NEED OV. 90 tablet 0   predniSONE (DELTASONE) 5 MG tablet Take 5 mg by mouth daily with breakfast.      torsemide (DEMADEX) 20 MG tablet Take 1 tablet (20 mg total) by mouth daily. 30 tablet 0   No current facility-administered medications for this visit.    PHYSICAL EXAMINATION: ECOG PERFORMANCE STATUS: {CHL ONC ECOG PS:[id number]}  There were no vitals filed for this visit. There were no vitals filed for this visit.  BREAST:*** No palpable masses or nodules in either right or left breasts. No palpable axillary supraclavicular or infraclavicular adenopathy no breast tenderness or nipple discharge. (exam performed in the presence of a chaperone)  LABORATORY DATA:  I have reviewed the data as listed    Latest Ref Rng & Units 04/14/2022    9:07 AM 03/28/2022   12:10 PM 03/20/2022    8:03 AM  CMP  Glucose 70 - 99 mg/dL 99  72  95   BUN 8 - 23 mg/dL 30  37  36   Creatinine 0.61 - 1.24 mg/dL 1.91  2.30  2.33   Sodium 135 - 145 mmol/L 144  140  142   Potassium 3.5 - 5.1 mmol/L 3.4  3.0  3.0   Chloride 98 - 111 mmol/L 109  104  107   CO2 22 - 32 mmol/L 28   26   Calcium 8.9 -  10.3 mg/dL 8.3   8.6   Total Protein 6.5 - 8.1 g/dL 5.9   5.4   Total Bilirubin 0.3 - 1.2 mg/dL 0.7   0.6   Alkaline Phos 38 - 126 U/L 45   49   AST 15 - 41 U/L 24   23   ALT 0 - 44 U/L 24   21     Lab Results  Component Value Date   WBC 7.7 04/14/2022   HGB 7.8 (L) 04/14/2022   HCT 24.3 (L) 04/14/2022   MCV 77.4 (L) 04/14/2022   PLT 91 (L) 04/14/2022   NEUTROABS 6.7 04/14/2022    ASSESSMENT & PLAN:  No problem-specific Assessment & Plan notes found for this encounter.    No orders of the defined types were placed in this encounter.  The patient has a good understanding of the overall plan. he agrees with it. he will call with any problems that may develop before the next visit  here. Total time spent: 30 mins including face to face time and time spent for planning, charting and co-ordination of care   Joshua Hudson, [name] 05/02/22    I Gardiner Coins am scribing for Dr. Lindi Adie  ***

## 2022-05-03 ENCOUNTER — Inpatient Hospital Stay: Payer: Medicare PPO

## 2022-05-03 ENCOUNTER — Inpatient Hospital Stay (HOSPITAL_BASED_OUTPATIENT_CLINIC_OR_DEPARTMENT_OTHER): Payer: Medicare PPO | Admitting: Hematology and Oncology

## 2022-05-03 ENCOUNTER — Other Ambulatory Visit: Payer: Self-pay | Admitting: *Deleted

## 2022-05-03 ENCOUNTER — Other Ambulatory Visit: Payer: Self-pay

## 2022-05-03 DIAGNOSIS — D638 Anemia in other chronic diseases classified elsewhere: Secondary | ICD-10-CM | POA: Diagnosis not present

## 2022-05-03 DIAGNOSIS — N1832 Chronic kidney disease, stage 3b: Secondary | ICD-10-CM | POA: Diagnosis not present

## 2022-05-03 DIAGNOSIS — Z7901 Long term (current) use of anticoagulants: Secondary | ICD-10-CM | POA: Diagnosis not present

## 2022-05-03 DIAGNOSIS — I4891 Unspecified atrial fibrillation: Secondary | ICD-10-CM | POA: Diagnosis not present

## 2022-05-03 DIAGNOSIS — D563 Thalassemia minor: Secondary | ICD-10-CM | POA: Diagnosis not present

## 2022-05-03 DIAGNOSIS — D631 Anemia in chronic kidney disease: Secondary | ICD-10-CM | POA: Diagnosis not present

## 2022-05-03 LAB — CBC WITH DIFFERENTIAL (CANCER CENTER ONLY)
Abs Immature Granulocytes: 0.07 10*3/uL (ref 0.00–0.07)
Basophils Absolute: 0 10*3/uL (ref 0.0–0.1)
Basophils Relative: 0 %
Eosinophils Absolute: 0.3 10*3/uL (ref 0.0–0.5)
Eosinophils Relative: 3 %
HCT: 27.2 % — ABNORMAL LOW (ref 39.0–52.0)
Hemoglobin: 8.8 g/dL — ABNORMAL LOW (ref 13.0–17.0)
Immature Granulocytes: 1 %
Lymphocytes Relative: 5 %
Lymphs Abs: 0.5 10*3/uL — ABNORMAL LOW (ref 0.7–4.0)
MCH: 24.8 pg — ABNORMAL LOW (ref 26.0–34.0)
MCHC: 32.4 g/dL (ref 30.0–36.0)
MCV: 76.6 fL — ABNORMAL LOW (ref 80.0–100.0)
Monocytes Absolute: 0.8 10*3/uL (ref 0.1–1.0)
Monocytes Relative: 7 %
Neutro Abs: 9.4 10*3/uL — ABNORMAL HIGH (ref 1.7–7.7)
Neutrophils Relative %: 84 %
Platelet Count: 108 10*3/uL — ABNORMAL LOW (ref 150–400)
RBC: 3.55 MIL/uL — ABNORMAL LOW (ref 4.22–5.81)
RDW: 20.2 % — ABNORMAL HIGH (ref 11.5–15.5)
WBC Count: 11.2 10*3/uL — ABNORMAL HIGH (ref 4.0–10.5)
nRBC: 0.2 % (ref 0.0–0.2)

## 2022-05-03 LAB — CMP (CANCER CENTER ONLY)
ALT: 24 U/L (ref 0–44)
AST: 23 U/L (ref 15–41)
Albumin: 3.7 g/dL (ref 3.5–5.0)
Alkaline Phosphatase: 45 U/L (ref 38–126)
Anion gap: 7 (ref 5–15)
BUN: 31 mg/dL — ABNORMAL HIGH (ref 8–23)
CO2: 28 mmol/L (ref 22–32)
Calcium: 9.2 mg/dL (ref 8.9–10.3)
Chloride: 104 mmol/L (ref 98–111)
Creatinine: 2.22 mg/dL — ABNORMAL HIGH (ref 0.61–1.24)
GFR, Estimated: 28 mL/min — ABNORMAL LOW (ref >60)
Glucose, Bld: 103 mg/dL — ABNORMAL HIGH (ref 70–99)
Potassium: 3.3 mmol/L — ABNORMAL LOW (ref 3.5–5.1)
Sodium: 139 mmol/L (ref 135–145)
Total Bilirubin: 0.6 mg/dL (ref 0.3–1.2)
Total Protein: 5.5 g/dL — ABNORMAL LOW (ref 6.5–8.1)

## 2022-05-03 LAB — IRON AND IRON BINDING CAPACITY (CC-WL,HP ONLY)
Iron: 47 ug/dL (ref 45–182)
Saturation Ratios: 22 % (ref 17.9–39.5)
TIBC: 217 ug/dL — ABNORMAL LOW (ref 250–450)
UIBC: 170 ug/dL (ref 117–376)

## 2022-05-03 LAB — SAMPLE TO BLOOD BANK

## 2022-05-03 LAB — FERRITIN: Ferritin: 155 ng/mL (ref 24–336)

## 2022-05-03 LAB — PREPARE RBC (CROSSMATCH)

## 2022-05-03 MED ORDER — SODIUM CHLORIDE 0.9 % IV SOLN: Freq: Once | INTRAVENOUS | Status: AC

## 2022-05-03 MED ORDER — DIPHENHYDRAMINE HCL 25 MG PO CAPS
25.0000 mg | ORAL_CAPSULE | Freq: Once | ORAL | Status: AC
  Administered 2022-05-03: 25 mg via ORAL
  Filled 2022-05-03: qty 1

## 2022-05-03 MED ORDER — ACETAMINOPHEN 325 MG PO TABS
650.0000 mg | ORAL_TABLET | Freq: Once | ORAL | Status: AC
  Administered 2022-05-03: 650 mg via ORAL
  Filled 2022-05-03: qty 2

## 2022-05-03 NOTE — Assessment & Plan Note (Signed)
Hemoglobin electrophoresis: Beta thalassemia minor.This is the cause of microcytosis. It is not iron deficiency related.  Possible differential:Anemia of chronic disease versus myelodysplastic syndrome versus methotrexate related toxicity.   Hospitalization 09/22/2019-09/25/2019: A. fib with RVR status post cardioversion, currently on Eliquis Hospitalization 02/28/2022-03/11/2022: Atrial fibrillation: Cardioversion planned for 03/22/2022.  Lab review: 09/25/2019: WBC 7.2, hemoglobin 9.6, platelets 145 10/03/19: WBC: 7.5, Hb 8.9, Pl: 135 04/18/2021: Hemoglobin 8.7 (Retacrit), blood transfusion on 04/04/2021 for hemoglobin of 7.7 05/30/21:Hemoglobin 6.7, blood transfusion, platelets 85 (will need to be watched) 08/23/2021: Hemoglobin 9.6 (Retacrit) 10/04/2021: Hemoglobin 8, platelets 151 (2 units of PRBC) 12/05/2021: Hemoglobin 8.7 12/26/2021:Hemoglobin 7.1, platelets 118 03/20/2022: Hemoglobin 7.1, platelets 127 (2 units of PRBC) 04/14/22: Hb 7.8, Platelets 91 (PRBC)  His anemia is due to chronickidneydiseasestage III.  Return to clinic in 3 weeks for labs and blood transfusion if needed. I will see him back in 6 weeks.

## 2022-05-03 NOTE — Progress Notes (Signed)
Pt Hgb 8.8.  per MD due to pt being symptomatic and fatigue, pt to receive 1 unit PRBC's today.  Orders placed.

## 2022-05-04 LAB — BPAM RBC
Blood Product Expiration Date: 202309242359
ISSUE DATE / TIME: 202308301048
Unit Type and Rh: 6200

## 2022-05-04 LAB — TYPE AND SCREEN
ABO/RH(D): A POS
Antibody Screen: NEGATIVE
Unit division: 0

## 2022-05-11 DIAGNOSIS — N401 Enlarged prostate with lower urinary tract symptoms: Secondary | ICD-10-CM | POA: Diagnosis not present

## 2022-05-11 DIAGNOSIS — I5040 Unspecified combined systolic (congestive) and diastolic (congestive) heart failure: Secondary | ICD-10-CM | POA: Diagnosis not present

## 2022-05-11 DIAGNOSIS — I482 Chronic atrial fibrillation, unspecified: Secondary | ICD-10-CM | POA: Diagnosis not present

## 2022-05-11 DIAGNOSIS — I5030 Unspecified diastolic (congestive) heart failure: Secondary | ICD-10-CM | POA: Diagnosis not present

## 2022-05-11 DIAGNOSIS — K219 Gastro-esophageal reflux disease without esophagitis: Secondary | ICD-10-CM | POA: Diagnosis not present

## 2022-05-11 DIAGNOSIS — E039 Hypothyroidism, unspecified: Secondary | ICD-10-CM | POA: Diagnosis not present

## 2022-05-11 DIAGNOSIS — M810 Age-related osteoporosis without current pathological fracture: Secondary | ICD-10-CM | POA: Diagnosis not present

## 2022-05-11 NOTE — Pre-Procedure Instructions (Signed)
Instructed patient on the following items: Arrival time 0730 Nothing to eat or drink after midnight No meds AM of procedure Responsible person to drive you home and stay with you for 24 hrs Wash with special soap night before and morning of procedure If on anti-coagulant drug instructions Eliquis- last dose 9/5

## 2022-05-12 ENCOUNTER — Ambulatory Visit (HOSPITAL_COMMUNITY): Payer: Medicare PPO

## 2022-05-12 ENCOUNTER — Other Ambulatory Visit: Payer: Self-pay

## 2022-05-12 ENCOUNTER — Ambulatory Visit (HOSPITAL_COMMUNITY)
Admission: RE | Admit: 2022-05-12 | Discharge: 2022-05-12 | Disposition: A | Discharge status: Home / Self Care | Payer: Medicare PPO | Attending: Internal Medicine | Admitting: Internal Medicine

## 2022-05-12 ENCOUNTER — Encounter (HOSPITAL_COMMUNITY)
Admission: RE | Disposition: A | Discharge status: Home / Self Care | Payer: Medicare PPO | Source: Home / Self Care | Attending: Internal Medicine

## 2022-05-12 DIAGNOSIS — I4891 Unspecified atrial fibrillation: Secondary | ICD-10-CM | POA: Insufficient documentation

## 2022-05-12 DIAGNOSIS — I4892 Unspecified atrial flutter: Secondary | ICD-10-CM | POA: Insufficient documentation

## 2022-05-12 DIAGNOSIS — I5032 Chronic diastolic (congestive) heart failure: Secondary | ICD-10-CM | POA: Diagnosis not present

## 2022-05-12 DIAGNOSIS — I110 Hypertensive heart disease with heart failure: Secondary | ICD-10-CM | POA: Insufficient documentation

## 2022-05-12 DIAGNOSIS — Z95810 Presence of automatic (implantable) cardiac defibrillator: Secondary | ICD-10-CM | POA: Diagnosis not present

## 2022-05-12 DIAGNOSIS — Z87891 Personal history of nicotine dependence: Secondary | ICD-10-CM | POA: Insufficient documentation

## 2022-05-12 HISTORY — PX: PACEMAKER IMPLANT: EP1218

## 2022-05-12 HISTORY — PX: AV NODE ABLATION: EP1193

## 2022-05-12 LAB — CBC
HCT: 31.3 % — ABNORMAL LOW (ref 39.0–52.0)
Hemoglobin: 9.8 g/dL — ABNORMAL LOW (ref 13.0–17.0)
MCH: 24.9 pg — ABNORMAL LOW (ref 26.0–34.0)
MCHC: 31.3 g/dL (ref 30.0–36.0)
MCV: 79.4 fL — ABNORMAL LOW (ref 80.0–100.0)
Platelets: 98 10*3/uL — ABNORMAL LOW (ref 150–400)
RBC: 3.94 MIL/uL — ABNORMAL LOW (ref 4.22–5.81)
RDW: 20.1 % — ABNORMAL HIGH (ref 11.5–15.5)
WBC: 8.3 10*3/uL (ref 4.0–10.5)
nRBC: 0.2 % (ref 0.0–0.2)

## 2022-05-12 LAB — BASIC METABOLIC PANEL
Anion gap: 12 (ref 5–15)
BUN: 30 mg/dL — ABNORMAL HIGH (ref 8–23)
CO2: 25 mmol/L (ref 22–32)
Calcium: 9.3 mg/dL (ref 8.9–10.3)
Chloride: 103 mmol/L (ref 98–111)
Creatinine, Ser: 2.08 mg/dL — ABNORMAL HIGH (ref 0.61–1.24)
GFR, Estimated: 31 mL/min — ABNORMAL LOW (ref >60)
Glucose, Bld: 79 mg/dL (ref 70–99)
Potassium: 3.9 mmol/L (ref 3.5–5.1)
Sodium: 140 mmol/L (ref 135–145)

## 2022-05-12 SURGERY — AV NODE ABLATION

## 2022-05-12 MED ORDER — POVIDONE-IODINE 10 % EX SWAB
2.0000 | Freq: Once | CUTANEOUS | Status: AC
  Administered 2022-05-12: 2 via TOPICAL

## 2022-05-12 MED ORDER — CEFAZOLIN SODIUM-DEXTROSE 1-4 GM/50ML-% IV SOLN
1.0000 g | Freq: Three times a day (TID) | INTRAVENOUS | Status: DC
  Administered 2022-05-12: 1 g via INTRAVENOUS
  Filled 2022-05-12: qty 50

## 2022-05-12 MED ORDER — IOHEXOL 350 MG/ML SOLN
INTRAVENOUS | Status: DC | PRN
  Administered 2022-05-12: 15 mL

## 2022-05-12 MED ORDER — SODIUM CHLORIDE 0.9 % IV SOLN
INTRAVENOUS | Status: AC
  Filled 2022-05-12: qty 2

## 2022-05-12 MED ORDER — SODIUM CHLORIDE 0.9 % IV SOLN: INTRAVENOUS | Status: DC

## 2022-05-12 MED ORDER — HEPARIN (PORCINE) IN NACL 1000-0.9 UT/500ML-% IV SOLN
INTRAVENOUS | Status: AC
  Filled 2022-05-12: qty 500

## 2022-05-12 MED ORDER — OXYCODONE HCL 5 MG PO TABS
ORAL_TABLET | ORAL | Status: AC
  Filled 2022-05-12: qty 1

## 2022-05-12 MED ORDER — ONDANSETRON HCL 4 MG/2ML IJ SOLN: 4.0000 mg | Freq: Four times a day (QID) | INTRAMUSCULAR | Status: DC | PRN

## 2022-05-12 MED ORDER — BUPIVACAINE HCL (PF) 0.25 % IJ SOLN
INTRAMUSCULAR | Status: DC | PRN
  Administered 2022-05-12: 30 mL

## 2022-05-12 MED ORDER — LIDOCAINE HCL (PF) 1 % IJ SOLN
INTRAMUSCULAR | Status: DC | PRN
  Administered 2022-05-12: 60 mL

## 2022-05-12 MED ORDER — ACETAMINOPHEN 325 MG PO TABS: 325.0000 mg | ORAL_TABLET | ORAL | Status: DC | PRN

## 2022-05-12 MED ORDER — OXYCODONE HCL 5 MG PO TABS
5.0000 mg | ORAL_TABLET | Freq: Once | ORAL | Status: AC
  Administered 2022-05-12: 5 mg via ORAL

## 2022-05-12 MED ORDER — CEFAZOLIN SODIUM-DEXTROSE 2-4 GM/100ML-% IV SOLN
2.0000 g | INTRAVENOUS | Status: AC
  Administered 2022-05-12: 2 g via INTRAVENOUS

## 2022-05-12 MED ORDER — LIDOCAINE HCL (PF) 1 % IJ SOLN
INTRAMUSCULAR | Status: AC
  Filled 2022-05-12: qty 30

## 2022-05-12 MED ORDER — HEPARIN (PORCINE) IN NACL 1000-0.9 UT/500ML-% IV SOLN
INTRAVENOUS | Status: DC | PRN
  Administered 2022-05-12 (×2): 500 mL

## 2022-05-12 MED ORDER — SODIUM CHLORIDE 0.9 % IV SOLN
80.0000 mg | INTRAVENOUS | Status: AC
  Administered 2022-05-12: 80 mg

## 2022-05-12 MED ORDER — CHLORHEXIDINE GLUCONATE 4 % EX LIQD
4.0000 | Freq: Once | CUTANEOUS | Status: DC
  Filled 2022-05-12: qty 60

## 2022-05-12 MED ORDER — CEFAZOLIN SODIUM-DEXTROSE 2-4 GM/100ML-% IV SOLN
INTRAVENOUS | Status: AC
  Filled 2022-05-12: qty 100

## 2022-05-12 SURGICAL SUPPLY — 16 items
CABLE SURGICAL S-101-97-12 (CABLE) ×1 IMPLANT
CATH CELSIUS THERMO F CV 7FR (ABLATOR) IMPLANT
CATH RIGHTSITE C315HIS02 (CATHETERS) IMPLANT
IPG PACE AZUR XT DR MRI W1DR01 (Pacemaker) IMPLANT
LEAD CAPSURE NOVUS 5076-52CM (Lead) IMPLANT
LEAD SELECT SECURE 3830 383069 (Lead) IMPLANT
PACE AZURE XT DR MRI W1DR01 (Pacemaker) ×1 IMPLANT
PACK EP LATEX FREE (CUSTOM PROCEDURE TRAY) ×1
PACK EP LF (CUSTOM PROCEDURE TRAY) ×1 IMPLANT
PAD DEFIB RADIO PHYSIO CONN (PAD) ×1 IMPLANT
SELECT SECURE 3830 383069 (Lead) ×1 IMPLANT
SHEATH 7FR PRELUDE SNAP 13 (SHEATH) IMPLANT
SHEATH PINNACLE 8F 10CM (SHEATH) IMPLANT
SLITTER 6232ADJ (MISCELLANEOUS) IMPLANT
TRAY PACEMAKER INSERTION (PACKS) ×1 IMPLANT
WIRE HI TORQ VERSACORE-J 145CM (WIRE) IMPLANT

## 2022-05-12 NOTE — Progress Notes (Signed)
Site area: rt groin Site Prior to Removal:  Level 0 Pressure Applied For: 15 minutes Manual:   yes Patient Status During Pull:  stable Post Pull Site:  Level 0 Post Pull Instructions Given:  yes Post Pull Pulses Present: rt dp palpable Dressing Applied:  gauze and tegaderm Bedrest begins @ 1350 Comments:

## 2022-05-12 NOTE — Interval H&P Note (Signed)
History and Physical Interval Note:  05/12/2022 10:59 AM  Joshua Hudson  has presented today for surgery, with the diagnosis of afib , aflutter.  The various methods of treatment have been discussed with the patient and family. After consideration of risks, benefits and other options for treatment, the patient has consented to  Procedure(s): AV NODE ABLATION (N/A) PACEMAKER IMPLANT (N/A) as a surgical intervention.  The patient's history has been reviewed, patient examined, no change in status, stable for surgery.  I have reviewed the patient's chart and labs.  Questions were answered to the patient's satisfaction.     Joshua Hudson

## 2022-05-12 NOTE — Progress Notes (Signed)
Dr Taylor notified of post CXR-advised to proceed with d/c 

## 2022-05-12 NOTE — Discharge Instructions (Addendum)
Cardiac Ablation, Care After  This sheet gives you information about how to care for yourself after your procedure. Your health care provider may also give you more specific instructions. If you have problems or questions, contact your health care provider. What can I expect after the procedure? After the procedure, it is common to have: Bruising around your puncture site. Tenderness around your puncture site. Skipped heartbeats. Tiredness (fatigue).  Follow these instructions at home: Puncture site care  Follow instructions from your health care provider about how to take care of your puncture site. Make sure you: If present, leave stitches (sutures), skin glue, or adhesive strips in place. These skin closures may need to stay in place for up to 2 weeks. If adhesive strip edges start to loosen and curl up, you may trim the loose edges. Do not remove adhesive strips completely unless your health care provider tells you to do that. If a large square bandage is present, this may be removed 24 hours after surgery.  Check your puncture site every day for signs of infection. Check for: Redness, swelling, or pain. Fluid or blood. If your puncture site starts to bleed, lie down on your back, apply firm pressure to the area, and contact your health care provider. Warmth. Pus or a bad smell. A pea or small marble sized lump at the site is normal and can take up to three months to resolve.  Driving Do not drive for at least 4 days after your procedure or however long your health care provider recommends. (Do not resume driving if you have previously been instructed not to drive for other health reasons.) Do not drive or use heavy machinery while taking prescription pain medicine. Activity Avoid activities that take a lot of effort for at least 7 days after your procedure. Do not lift anything that is heavier than 5 lb (4.5 kg) for one week.  No sexual activity for 1 week.  Return to your normal  activities as told by your health care provider. Ask your health care provider what activities are safe for you. General instructions Take over-the-counter and prescription medicines only as told by your health care provider. Do not use any products that contain nicotine or tobacco, such as cigarettes and e-cigarettes. If you need help quitting, ask your health care provider. You may shower after 24 hours, but Do not take baths, swim, or use a hot tub for 1 week.  Do not drink alcohol for 24 hours after your procedure. Keep all follow-up visits as told by your health care provider. This is important. Contact a health care provider if: You have redness, mild swelling, or pain around your puncture site. You have fluid or blood coming from your puncture site that stops after applying firm pressure to the area. Your puncture site feels warm to the touch. You have pus or a bad smell coming from your puncture site. You have a fever. You have chest pain or discomfort that spreads to your neck, jaw, or arm. You are sweating a lot. You feel nauseous. You have a fast or irregular heartbeat. You have shortness of breath. You are dizzy or light-headed and feel the need to lie down. You have pain or numbness in the arm or leg closest to your puncture site. Get help right away if: Your puncture site suddenly swells. Your puncture site is bleeding and the bleeding does not stop after applying firm pressure to the area. These symptoms may represent a serious problem that is   an emergency. Do not wait to see if the symptoms will go away. Get medical help right away. Call your local emergency services (911 in the [place] Do not drive yourself to the hospital. Summary After the procedure, it is normal to have bruising and tenderness at the puncture site in your groin, neck, or forearm. Check your puncture site every day for signs of infection. Get help right away if your puncture site is bleeding and the  bleeding does not stop after applying firm pressure to the area. This is a medical emergency. This information is not intended to replace advice given to you by your health care provider. Make sure you discuss any questions you have with your health care provider. After Your Pacemaker   You have a  Pacemaker  ACTIVITY Do not lift your arm above shoulder height for 1 week after your procedure. After 7 days, you may progress as below.  You should remove your sling 24 hours after your procedure, unless otherwise instructed by your provider.     Friday May 19, 2022  Saturday May 20, 2022 Sunday May 21, 2022 Monday May 22, 2022   Do not lift, push, pull, or carry anything over 10 pounds with the affected arm until 6 weeks (Friday June 23, 2022 ) after your procedure.   You may drive AFTER your wound check, unless you have been told otherwise by your provider.   Ask your healthcare provider when you can go back to work   INCISION/Dressing If you are on a blood thinner such as Coumadin, Xarelto, Eliquis, Plavix, or Pradaxa please confirm with your provider when this should be resumed.   If large square, outer bandage is left in place, this can be removed after 24 hours from your procedure. Do not remove steri-strips or glue as below.   Monitor your Pacemaker site for redness, swelling, and drainage. Call the device clinic at [phone] if you experience these symptoms or fever/chills.    If you were discharged in a sling, please do not wear this during the day more than 48 hours after your surgery unless otherwise instructed. This may increase the risk of stiffness and soreness in your shoulder.   Avoid lotions, ointments, or perfumes over your incision until it is well-healed.  You may use a hot tub or a pool AFTER your wound check appointment if the incision is completely closed.  Pacemaker Alerts:  Some alerts are vibratory and others beep. These are NOT  emergencies. Please call our office to let us know. If this occurs at night or on weekends, it can wait until the next business day. Send a remote transmission.  If your device is capable of reading fluid status (for heart failure), you will be offered monthly monitoring to review this with you.   DEVICE MANAGEMENT Remote monitoring is used to monitor your pacemaker from home. This monitoring is scheduled every 91 days by our office. It allows [place] to keep an eye on the functioning of your device to ensure it is working properly. You will routinely see your Electrophysiologist annually (more often if necessary).   You should receive your ID card for your new device in 4-8 weeks. Keep this card with you at all times once received. Consider wearing a medical alert bracelet or necklace.  Your Pacemaker may be MRI compatible. This will be discussed at your next office visit/wound check.  You should avoid contact with strong electric or magnetic fields.   Do not use amateur (  ham) radio equipment or electric (arc) welding torches. MP3 player headphones with magnets should not be used. Some devices are safe to use if held at least 12 inches (30 cm) from your Pacemaker. These include power tools, lawn mowers, and speakers. If you are unsure if something is safe to use, ask your health care provider.  When using your cell phone, hold it to the ear that is on the opposite side from the Pacemaker. Do not leave your cell phone in a pocket over the Pacemaker.  You may safely use electric blankets, heating pads, computers, and microwave ovens.  Call the office right away if: You have chest pain. You feel more short of breath than you have felt before. You feel more light-headed than you have felt before. Your incision starts to open up.  This information is not intended to replace advice given to you by your health care provider. Make sure you discuss any questions you have with your health care provider.

## 2022-05-12 NOTE — Progress Notes (Signed)
Pt assisted with redress and left UE shoulder immobilizer

## 2022-05-15 ENCOUNTER — Encounter: Payer: Self-pay | Admitting: Internal Medicine

## 2022-05-15 ENCOUNTER — Encounter (HOSPITAL_COMMUNITY): Payer: Self-pay | Admitting: Internal Medicine

## 2022-05-22 DIAGNOSIS — R197 Diarrhea, unspecified: Secondary | ICD-10-CM | POA: Diagnosis not present

## 2022-05-23 DIAGNOSIS — H353211 Exudative age-related macular degeneration, right eye, with active choroidal neovascularization: Secondary | ICD-10-CM | POA: Diagnosis not present

## 2022-05-23 NOTE — Progress Notes (Unsigned)
Cardiology Office Note Date:  05/23/2022  Patient ID:  Joshua Hudson 1935-02-23, MRN [id number] PCP:  Lavone Orn, MD  Cardiologist:  Dr. Radford Pax EP: Dr. Lovena Le    Chief Complaint:  wound check  History of Present Illness: Joshua Hudson is a 87 y.o. male with history of HTN, VHD s/p AVR (bioprosthetic 2009 >>> TAVR 2020), RA,  Anchalasia, AFlutter s/p CTI ablation > developed LA flutter, AFib, chronic CHF (systolic), NICM, GIB.  Follows with heme/onc for anemia/thrombocytopenia  admitted 02/28/2022 with recurrent GI bleed.  He was recently admitted 6/21 through 6/24 with upper GI bleed due to erosive gastropathy but no stigmata of recent bleeding.  GI recommended twice daily PPI and his Eliquis was held for 1 week had AFib with RVR this admission and had TEE/DCCV 03/08/22, had ERAF within the same hospitalization > amio > DCCV 03/28/22 > continued to feel poorly despite SR  Saw Dr. Lovena Le 04/25/22, reported decreased appetite and dizziness and malaise attributed to amiodarone, HR was 48.  Planned PPM and AVN ablation Amio stopped.  Saw oncology 05/03/22, elt very weak, poorly, unable to stand up, BP this visit 99/54.  He gets PRBC every 3 weeks, anemia 2/2 CKD , planned for 3 week f/u and transsfusion if needed.  Underwent procedures 05/12/22.  TODAY He is accompanied by his daughter Generally continues to feel weak, no energy. Orthostatic  dizziness, on occasion with some near syncope. No CP, palpitations No SOB No wound concerns No syncope No overt bleeding or signs of bleeding   Device information MDT dual chamber PPM implanted 05/12/22 Followed by AV node ablation same day  Past Medical History:  Diagnosis Date   Achalasia 06/12/2019   Noted on EGD   Aortic insufficiency 03/24/2019   AI of bioprosthetic AVR severe 4 plus   Chronic diastolic CHF (congestive heart failure) ([place]) 03/24/2019   Colon polyps    s/p diverticular perforation requiring 2-stage repair    Derangement of right shoulder joint    need replacing has no use of   Diverticulosis    DJD (degenerative joint disease), lumbosacral    Dysphagia    eats soft food   Esophageal stricture    GERD (gastroesophageal reflux disease)    Hemolytic anemia ([place])    History of blood transfusion given with 04-15-19 surgery   History of blood transfusion 07/18/2019   History of kidney stones    passed stones   Hypertension    Mitral regurgitation    moderate   Occipital neuralgia    Pancytopenia (HCC)    Postoperative atrial fibrillation ([place]) 05/10/2015   Prosthetic valve dysfunction    Rheumatoid arthritis (HCC)    s/o long term steroids  shoulders and hands   Rotator cuff arthropathy    right   S/P aortic valve replacement with bioprosthetic valve 01/16/2008   65m Edwards Magna perimount bovine pericardial tissue valve, model 3000   S/P valve-in-valve TAVR 04/15/2019   26 mm Edwards Sapien 3 Ultra transcatheter heart valve placed via percutaneous right transfemoral approach    SBE (subacute bacterial endocarditis) prophylaxis candidate    for dental procedures   Schatzki's ring 06/12/2019   Narrowing, Noted on EGD   Severe aortic stenosis    S/P prosthetic valve replacement w 25 mm Edwards like science percardial tissue valve,[place] - 01/2008   Thrombocytopenia ([place]     Past Surgical History:  Procedure Laterality Date   A-FLUTTER ABLATION N/A 10/10/2019   Procedure: A-FLUTTER ABLATION;  Surgeon: Evans Lance, MD;  Location: [facility];  Service: Cardiovascular;  Laterality: N/A;   APPENDECTOMY     AV NODE ABLATION N/A 05/12/2022   Procedure: AV NODE ABLATION;  Surgeon: Evans Lance, MD;  Location: [facility] [facility];  Service: Cardiovascular;  Laterality: N/A;   BALLOON DILATION N/A 06/12/2019   Procedure: BALLOON DILATION;  Surgeon: Ronnette Juniper, MD;  [facility]: [facility];  Service: Gastroenterology;  Laterality: N/A;   BOTOX INJECTION N/A 01/22/2018   Procedure:  BOTOX INJECTION;  Surgeon: Ronnette Juniper, MD;  [facility];  Service: Gastroenterology;  Laterality: N/A;   CARDIAC CATHETERIZATION     09   CARDIAC VALVE REPLACEMENT  01/2008   aortic valve replacement   CARDIOVERSION N/A 09/25/2019   Procedure: CARDIOVERSION;  Surgeon: Jerline Pain, MD;  Location: [place];  Service: Cardiovascular;  Laterality: N/A;   CARDIOVERSION N/A 12/25/2019   Procedure: CARDIOVERSION;  Surgeon: Lelon Perla, MD;  Location: [place];  Service: Cardiovascular;  Laterality: N/A;   CARDIOVERSION N/A 03/08/2022   Procedure: CARDIOVERSION;  Surgeon: [name], MD;  Location: [facility];  Service: Cardiovascular;  Laterality: N/A;   CARDIOVERSION N/A 03/28/2022   Procedure: CARDIOVERSION;  Surgeon: Berniece Salines, DO;  Location: [place];  Service: Cardiovascular;  Laterality: N/A;   CATARACT EXTRACTION W/ INTRAOCULAR LENS  IMPLANT, BILATERAL     COLON RESECTION     diverticulitis    COLONOSCOPY N/A 03/03/2022   Procedure: COLONOSCOPY;  Surgeon: Otis Brace, MD;  [facility];  Service: Gastroenterology;  Laterality: N/A;   CORONARY ANGIOPLASTY     dental implants     permanent   ENTEROSCOPY N/A 03/03/2022   Procedure: ENTEROSCOPY;  Surgeon: Otis Brace, MD;  [facility];  Service: Gastroenterology;  Laterality: N/A;   ESOPHAGEAL MANOMETRY N/A 11/07/2017   Procedure: ESOPHAGEAL MANOMETRY (EM);  Surgeon: Ronnette Juniper, MD;  [facility];  Service: Gastroenterology;  Laterality: N/A;   ESOPHAGOGASTRODUODENOSCOPY N/A 02/24/2022   Procedure: ESOPHAGOGASTRODUODENOSCOPY (EGD);  Surgeon: Arta Silence, MD;  Location: [facility];  Service: Gastroenterology;  Laterality: N/A;   ESOPHAGOGASTRODUODENOSCOPY (EGD) WITH PROPOFOL N/A 01/22/2018   Procedure: ESOPHAGOGASTRODUODENOSCOPY (EGD) WITH PROPOFOL;  Surgeon: Ronnette Juniper, MD;  [facility];  Service: Gastroenterology;  Laterality: N/A;    ESOPHAGOGASTRODUODENOSCOPY (EGD) WITH PROPOFOL N/A 04/30/2019   Procedure: ESOPHAGOGASTRODUODENOSCOPY (EGD) WITH PROPOFOL;  Surgeon: Laurence Spates, MD;  Location: [place];  Service: Endoscopy;  Laterality: N/A;   ESOPHAGOGASTRODUODENOSCOPY (EGD) WITH PROPOFOL N/A 06/12/2019   Procedure: ESOPHAGOGASTRODUODENOSCOPY (EGD) WITH PROPOFOL;  Surgeon: Ronnette Juniper, MD;  [facility];  Service: Gastroenterology;  Laterality: N/A;  with botox injection   EXCISIONAL TOTAL SHOULDER ARTHROPLASTY WITH ANTIBIOTIC SPACER Right 12/31/2018   Procedure: EXCISIONAL TOTAL SHOULDER ARTHROPLASTY WITH ANTIBIOTIC SPACER;  Surgeon: Netta Cedars, MD;  Location: [place];  Service: Orthopedics;  Laterality: Right;   GIVENS CAPSULE STUDY N/A 03/01/2022   Procedure: GIVENS CAPSULE STUDY;  Surgeon: Otis Brace, MD;  [facility];  Service: Gastroenterology;  Laterality: N/A;   HEMOSTASIS CLIP PLACEMENT  03/03/2022   Procedure: HEMOSTASIS CLIP PLACEMENT;  Surgeon: Otis Brace, MD;  [facility];  Service: Gastroenterology;;   HERNIA REPAIR     HOT HEMOSTASIS N/A 03/03/2022   Procedure: HOT HEMOSTASIS (ARGON PLASMA COAGULATION/BICAP);  Surgeon: Otis Brace, MD;  Location: [facility];  Service: Gastroenterology;  Laterality: N/A;  EGD and COLON   INTRAOPERATIVE TRANSTHORACIC ECHOCARDIOGRAM N/A 04/15/2019   Procedure: Intraoperative Transthoracic Echocardiogram;  Surgeon: Sherren Mocha, MD;  Location: [place];  Service: Open Heart Surgery;  Laterality: N/A;   IRRIGATION AND DEBRIDEMENT SHOULDER Right 11/20/2018    IRRIGATION AND DEBRIDEMENT SHOULDER WITH POLY EXCHANGE (Right Shoulder)   IRRIGATION AND DEBRIDEMENT SHOULDER Right 11/20/2018   Procedure: IRRIGATION AND DEBRIDEMENT SHOULDER WITH POLY EXCHANGE;  Surgeon: Netta Cedars, MD;  Location: [place];  Service: Orthopedics;  Laterality: Right;   LUMBAR LAMINECTOMY     x 2   PACEMAKER IMPLANT N/A 05/12/2022   Procedure: PACEMAKER  IMPLANT;  Surgeon: Evans Lance, MD;  Location: [place] [facility];  Service: Cardiovascular;  Laterality: N/A;   POLYPECTOMY  03/03/2022   Procedure: POLYPECTOMY;  Surgeon: Otis Brace, MD;  [facility];  Service: Gastroenterology;;   REVERSE SHOULDER ARTHROPLASTY Right 03/01/2018   Procedure: RIGHT REVERSE SHOULDER ARTHROPLASTY;  Surgeon: Netta Cedars, MD;  Location: [place];  Service: Orthopedics;  Laterality: Right;   REVERSE SHOULDER ARTHROPLASTY Right 07/18/2019   Procedure: REVERSE TOTAL SHOULDER ARTHROPLASTY and removal of antiobotic spacer;  Surgeon: Netta Cedars, MD;  [facility];  Service: Orthopedics;  Laterality: Right;  interscalene block   REVERSE SHOULDER ARTHROPLASTY Right 08/06/2019   Procedure: Reduction of dislocated reverse total shoulder and poly exchange;  Surgeon: Netta Cedars, MD;  [facility];  Service: Orthopedics;  Laterality: Right;  need 1 hour   RIGHT/LEFT HEART CATH AND CORONARY ANGIOGRAPHY N/A 04/02/2019   Procedure: RIGHT/LEFT HEART CATH AND CORONARY ANGIOGRAPHY;  Surgeon: Leonie Man, MD;  Location: [place] [facility];  Service: Cardiovascular;  Laterality: N/A;   SAVORY DILATION N/A 01/22/2018   Procedure: SAVORY DILATION;  Surgeon: Ronnette Juniper, MD;  [facility];  Service: Gastroenterology;  Laterality: N/A;   SAVORY DILATION N/A 04/30/2019   Procedure: SAVORY DILATION;  Surgeon: Laurence Spates, MD;  Location: [place];  Service: Endoscopy;  Laterality: N/A;  With fluro   SHOULDER HEMI-ARTHROPLASTY Right 06/14/2018   Procedure: RIGHT  REVERSE TOTAL SHOULDER OPEN POLY EXCHANGE;  Surgeon: Netta Cedars, MD;  Location: [place];  Service: Orthopedics;  Laterality: Right;   SUBMUCOSAL INJECTION  06/12/2019   Procedure: SUBMUCOSAL INJECTION;  Surgeon: Ronnette Juniper, MD;  [facility];  Service: Gastroenterology;;   TEE WITHOUT CARDIOVERSION N/A 01/29/2014   Procedure: TRANSESOPHAGEAL ECHOCARDIOGRAM (TEE);  Surgeon:  Sueanne Margarita, MD;  Location: [facility] ENDOSCOPY;  Service: Cardiovascular;  Laterality: N/A;   TEE WITHOUT CARDIOVERSION N/A 04/02/2019   Procedure: TRANSESOPHAGEAL ECHOCARDIOGRAM (TEE);  Surgeon: Josue Hector, MD;  Location: [facility] ENDOSCOPY;  Service: Cardiovascular;  Laterality: N/A;   TEE WITHOUT CARDIOVERSION N/A 09/25/2019   Procedure: TRANSESOPHAGEAL ECHOCARDIOGRAM (TEE);  Surgeon: Jerline Pain, MD;  Location: [facility];  Service: Cardiovascular;  Laterality: N/A;   TEE WITHOUT CARDIOVERSION N/A 03/08/2022   Procedure: TRANSESOPHAGEAL ECHOCARDIOGRAM (TEE);  Surgeon: [name], MD;  Location: [facility] END[facility];  Service: Cardiovascular;  Laterality: N/A;   TONSILLECTOMY     TRANSCATHETER AORTIC VALVE REPLACEMENT, TRANSFEMORAL  04/15/2019   TRANSCATHETER AORTIC VALVE REPLACEMENT, TRANSFEMORAL N/A 04/15/2019   Procedure: TRANSCATHETER AORTIC VALVE REPLACEMENT, TRANSFEMORAL with POST BALLOON DILATION;  Surgeon: Sherren Mocha, MD;  Location: [place];  Service: Open Heart Surgery;  Laterality: N/A;    Current Outpatient Medications  Medication Sig Dispense Refill   apixaban (ELIQUIS) 2.5 MG TABS tablet Take 1 tablet (2.5 mg total) by mouth 2 (two) times daily. 180 tablet 1   cholestyramine (QUESTRAN) 4 g packet Take 4 g by mouth daily.  epoetin alfa-epbx (RETACRIT) 29924 UNIT/ML injection Inject 40,000 Units into the skin every 6 (six) weeks. As needed     finasteride (PROSCAR) 5 MG tablet Take 5 mg by mouth daily.     fluticasone (FLONASE) 50 MCG/ACT nasal spray Place 1 spray into both nostrils daily as needed for allergies or rhinitis.     ketotifen (ZADITOR) 0.025 % ophthalmic solution Place 1 drop into both eyes 2 (two) times daily as needed (itchy eyes).     levothyroxine (SYNTHROID) 88 MCG tablet Take 88 mcg by mouth every morning.     mineral oil-hydrophilic petrolatum (AQUAPHOR) ointment Apply 1 Application topically as needed (wound care).     Multiple Vitamins-Minerals  (MULTIVITAMIN WITH MINERALS) tablet Take 1 tablet by mouth daily.     pantoprazole (PROTONIX) 40 MG tablet Take 1 tablet (40 mg total) by mouth 2 (two) times daily before a meal for 30 days, THEN 1 tablet (40 mg total) daily. (Patient taking differently: Take 1 tablet (40 mg total) daily.) 120 tablet 0   potassium chloride SA (KLOR-CON) 20 MEQ tablet Take 1 tablet (20 mEq total) by mouth daily. NEED OV. 90 tablet 0   predniSONE (DELTASONE) 5 MG tablet Take 5 mg by mouth daily with breakfast.      torsemide (DEMADEX) 20 MG tablet Take 1 tablet (20 mg total) by mouth daily. 30 tablet 0   No current facility-administered medications for this visit.    Allergies:   Patient has no known allergies.   Social History:  The patient  reports that he quit smoking about 38 years ago. His smoking use included cigarettes. He started smoking about 48 years ago. He has a 5.00 pack-year smoking history. He has quit using smokeless tobacco.  His smokeless tobacco use included chew. He reports that he does not currently use alcohol. He reports that he does not use drugs.   Family History:  The patient's family history includes Arthritis in his father; Heart Problems in his sister; Heart disease in his father; Other in his brother, daughter, mother, and sister; Suicidality in his brother.  ROS:  Please see the history of present illness.   All other systems are reviewed and otherwise negative.   PHYSICAL EXAM:  VS:  There were no vitals taken for this visit. BMI: There is no height or weight on file to calculate BMI. Well nourished, well developed, in no acute distress  HEENT: normocephalic, atraumatic  Neck: no JVD, carotid bruits or masses Cardiac: RRR; 2/6SM, no rubs, or gallops Lungs:  CTA b/l, no wheezing, rhonchi or rales  Abd: soft, nontender MS: no deformity, he is quite thin, advanced atrophy Ext: trace -1 edema is noted  Skin: warm and dry, no rash Neuro:  No gross deficits appreciated Psych:  euthymic mood, full affect  PPM site: steri strips are removed without difficulty, wound edges are well approximated, no erythema, edema, or fluctuation, no heat, non tender   EKG:  not done today  Device interrogation done today and reviewed by myself: Battery and lead measurements are good. He is AV pacing today + AFlutter, 8.7% burden since implant   03/08/2022: TEE 1. Left ventricular ejection fraction, by estimation, is 50 to 55%. The  left ventricle has low normal function.   2. Right ventricular systolic function was not well visualized. The right  ventricular size is mildly enlarged.   3. Left atrial size was moderately dilated. No left atrial/left atrial  appendage thrombus was detected. The LAA emptying velocity  was 20 cm/s.   4. Right atrial size was mildly dilated.   5. The mitral valve is grossly normal. Mild mitral valve regurgitation.   6. Tricuspid valve regurgitation is mild to moderate.   7. The aortic valve has been repaired/replaced. Patient had initial  surgical bioprosthetic prosthesis followed by a valve-in-valve TAVR with a  26 mm Edwards Sapien prosthetic (TAVR) valve. Procedure Date: 04/15/19.  Echo findings are consistent with  normal structure and function of the aortic valve prosthesis. Trivial  perivalvular leak. Gastric views not obtained due to difficulty passing  echo probe. Visually, there is normal leaflet motion with no evidence of  aortic stenosis.   8. Following TEE, the patient underwent DCCV with 150J, 200J with initial  return to sinus bradycardia.    Echo 02/24/22: IMPRESSIONS   1. Left ventricular ejection fraction, by estimation, is 55 to 60%. The  left ventricle has normal function. The left ventricle has no regional  wall motion abnormalities. Left ventricular diastolic function could not  be evaluated.   2. Right ventricular systolic function is moderately reduced. The right  ventricular size is moderately enlarged.   3. Left  atrial size was severely dilated.   4. Right atrial size was mildly dilated.   5. The mitral valve is normal in structure. Mild mitral valve  regurgitation.   6. Tricuspid valve regurgitation is moderate.   7. The aortic valve has been repaired/replaced. Aortic valve  regurgitation is not visualized. No aortic stenosis is present.   09/25/2019: TTE IMPRESSIONS  1. Left ventricular ejection fraction, by visual estimation, is 40 to  45%. The left ventricle has moderately decreased function. There is no  left ventricular hypertrophy.   2. The left ventricle demonstrates global hypokinesis.   3. Global right ventricle has normal systolic function.The right  ventricular size is normal. No increase in right ventricular wall  thickness.   4. Left atrial size was normal.   5. No LAA thrombus.   6. Right atrial size was normal.   7. The mitral valve is normal in structure. Mild mitral valve  regurgitation. No evidence of mitral stenosis.   8. The tricuspid valve is normal in structure.   9. The tricuspid valve is normal in structure. Tricuspid valve  regurgitation is moderate.  10. Aortic valve regurgitation is not visualized. No evidence of aortic  valve sclerosis or stenosis.  11. Valve in valve procedure 03/2019. No perivalvular leak.      There is a crescentric space in the 9 to 1 o'clock position (along  intraatrial septum-what was the non-coronary cusp) 0.6cm in width, 2.7 in  approximate length that does not have any flow (Color Dopper negative).  This space was present on pre-op TEE  during valve in valve procedure but does not appear to be as prominently  visualized in post op transthroacic echocardiograms. Discussed with  referring cardiologist.  12. The pulmonic valve was normal in structure. Pulmonic valve  regurgitation is not visualized.  13. Normal pulmonary artery systolic pressure.  14. The inferior vena cava is normal in size with greater than 50%  respiratory  variability, suggesting right atrial pressure of 3 mmHg.  15. Did not attempt transgastric views secondary to stricure.     04/02/2019: LHC Angiographically minimal CAD The left ventricular systolic function is normal -as assessed by aortic root angiogram There is severe (4+) aortic regurgitation. LV end diastolic pressure is moderately elevated.   SUMMARY Angiographically normal coronary arteries with a right dominant system.  Severe aortic insufficiency seen on aortic root angiogram Mild to moderate elevated LVEDP and PCWP   RECOMMENDATIONS We will place the patient in extended recovery/observation status to allow for monitoring of hemoglobin post procedure as well as to obtain consultations from the cardiac structural team and potentially hematology to assess his ongoing anemia. Anticipate that he should be able to home tomorrow, but would consider checking Cardiac CTA for pre-TAVR prior to discharge. Otherwise continue home meds.    09/22/2019: TTE LVEF 45-50% 05/14/2019: TTE  LVEF 55-60% 04/16/2019; TTE, LVEF 60-65%   Recent Labs: 02/24/2022: TSH 0.691 02/28/2022: B Natriuretic Peptide 256.0 03/10/2022: Magnesium 2.0 05/03/2022: ALT 24 05/12/2022: BUN 30; Creatinine, Ser 2.08; Hemoglobin 9.8; Platelets 98; Potassium 3.9; Sodium 140  No results found for requested labs within last 365 days.   Estimated Creatinine Clearance: 17 mL/min (A) (by C-G formula based on SCr of 2.08 mg/dL (H)).   Wt Readings from Last 3 Encounters:  05/12/22 102 lb (46.3 kg)  05/03/22 102 lb 4.8 oz (46.4 kg)  04/25/22 105 lb (47.6 kg)     Other studies reviewed: Additional studies/records reviewed today include: summarized above  ASSESSMENT AND PLAN:  1. PPM Well healed No signs of infection Intact function Acute implant outputs remain No programming changes made today Base pacing rate is 70  2. Persistent AFib     CHA2DS2Vasc is 4, on Eliquis, appropriately dosed for age and weight     Now  s/p AVNode ablation     Chronic anemia is problematic for him     A/c has been continued, follows closely with heme  3. VHD     S/p AVR 2009 >>> TAVR 2020     Functioning well by TEE     C/w Dr. Radford Pax, and structural heart team   4. Chronic CHF (systolic) 5. NICM     Recovered LVEF     He has some edema today, has orthostatic dizziness/near syncope     He has been taking 2 tabs of the torsemide daily (not one) by someone's recommendation.  I have asked him to reduce his torsemide back to one pill daily and reduce his K+ to one every other day  6. HTN     Now with low BP's orthostatic symptoms     Likely multifactorial     Reduce his diuretic as above, discussed trying compression wear as well.   Disposition: he does not need rate reduction but given his orthostatic issues, med change today will keep Andy's appt as scheduled.   Current medicines are reviewed at length with the patient today.  The patient did not have any concerns regarding medicines.  Venetia Night, PA-C 05/23/2022 7:51 AM     [address]  [place] [id number] [phone] (office)  [phone] (fax)

## 2022-05-24 ENCOUNTER — Inpatient Hospital Stay: Payer: Medicare PPO

## 2022-05-24 ENCOUNTER — Inpatient Hospital Stay: Payer: Medicare PPO | Attending: Hematology and Oncology

## 2022-05-24 ENCOUNTER — Ambulatory Visit: Payer: Medicare PPO

## 2022-05-24 VITALS — BP 127/81 | HR 69 | Temp 97.6°F | Resp 17

## 2022-05-24 DIAGNOSIS — D638 Anemia in other chronic diseases classified elsewhere: Secondary | ICD-10-CM

## 2022-05-24 DIAGNOSIS — N1832 Chronic kidney disease, stage 3b: Secondary | ICD-10-CM | POA: Diagnosis not present

## 2022-05-24 DIAGNOSIS — D631 Anemia in chronic kidney disease: Secondary | ICD-10-CM | POA: Insufficient documentation

## 2022-05-24 LAB — CBC WITH DIFFERENTIAL (CANCER CENTER ONLY)
Abs Immature Granulocytes: 0.13 10*3/uL — ABNORMAL HIGH (ref 0.00–0.07)
Basophils Absolute: 0 10*3/uL (ref 0.0–0.1)
Basophils Relative: 0 %
Eosinophils Absolute: 0.1 10*3/uL (ref 0.0–0.5)
Eosinophils Relative: 1 %
HCT: 30.1 % — ABNORMAL LOW (ref 39.0–52.0)
Hemoglobin: 9.3 g/dL — ABNORMAL LOW (ref 13.0–17.0)
Immature Granulocytes: 1 %
Lymphocytes Relative: 6 %
Lymphs Abs: 0.5 10*3/uL — ABNORMAL LOW (ref 0.7–4.0)
MCH: 23.9 pg — ABNORMAL LOW (ref 26.0–34.0)
MCHC: 30.9 g/dL (ref 30.0–36.0)
MCV: 77.4 fL — ABNORMAL LOW (ref 80.0–100.0)
Monocytes Absolute: 0.3 10*3/uL (ref 0.1–1.0)
Monocytes Relative: 3 %
Neutro Abs: 8.4 10*3/uL — ABNORMAL HIGH (ref 1.7–7.7)
Neutrophils Relative %: 89 %
Platelet Count: 146 10*3/uL — ABNORMAL LOW (ref 150–400)
RBC: 3.89 MIL/uL — ABNORMAL LOW (ref 4.22–5.81)
RDW: 19.4 % — ABNORMAL HIGH (ref 11.5–15.5)
WBC Count: 9.4 10*3/uL (ref 4.0–10.5)
nRBC: 0.3 % — ABNORMAL HIGH (ref 0.0–0.2)

## 2022-05-24 LAB — CMP (CANCER CENTER ONLY)
ALT: 13 U/L (ref 0–44)
AST: 20 U/L (ref 15–41)
Albumin: 3.8 g/dL (ref 3.5–5.0)
Alkaline Phosphatase: 38 U/L (ref 38–126)
Anion gap: 6 (ref 5–15)
BUN: 43 mg/dL — ABNORMAL HIGH (ref 8–23)
CO2: 30 mmol/L (ref 22–32)
Calcium: 9.2 mg/dL (ref 8.9–10.3)
Chloride: 104 mmol/L (ref 98–111)
Creatinine: 2.32 mg/dL — ABNORMAL HIGH (ref 0.61–1.24)
GFR, Estimated: 27 mL/min — ABNORMAL LOW (ref >60)
Glucose, Bld: 114 mg/dL — ABNORMAL HIGH (ref 70–99)
Potassium: 4.5 mmol/L (ref 3.5–5.1)
Sodium: 140 mmol/L (ref 135–145)
Total Bilirubin: 0.5 mg/dL (ref 0.3–1.2)
Total Protein: 5.7 g/dL — ABNORMAL LOW (ref 6.5–8.1)

## 2022-05-24 LAB — IRON AND IRON BINDING CAPACITY (CC-WL,HP ONLY)
Iron: 68 ug/dL (ref 45–182)
Saturation Ratios: 27 % (ref 17.9–39.5)
TIBC: 251 ug/dL (ref 250–450)
UIBC: 183 ug/dL (ref 117–376)

## 2022-05-24 LAB — SAMPLE TO BLOOD BANK

## 2022-05-24 LAB — FERRITIN: Ferritin: 175 ng/mL (ref 24–336)

## 2022-05-24 MED ORDER — EPOETIN ALFA-EPBX 40000 UNIT/ML IJ SOLN
40000.0000 [IU] | Freq: Once | INTRAMUSCULAR | Status: AC
  Administered 2022-05-24: 40000 [IU] via SUBCUTANEOUS
  Filled 2022-05-24: qty 1

## 2022-05-25 ENCOUNTER — Encounter: Payer: Self-pay | Admitting: Physician Assistant

## 2022-05-25 ENCOUNTER — Ambulatory Visit: Payer: Medicare PPO | Attending: Physician Assistant | Admitting: Physician Assistant

## 2022-05-25 VITALS — BP 104/68 | HR 71 | Ht 63.0 in | Wt 97.0 lb

## 2022-05-25 DIAGNOSIS — I428 Other cardiomyopathies: Secondary | ICD-10-CM | POA: Diagnosis not present

## 2022-05-25 DIAGNOSIS — Z950 Presence of cardiac pacemaker: Secondary | ICD-10-CM

## 2022-05-25 DIAGNOSIS — I5022 Chronic systolic (congestive) heart failure: Secondary | ICD-10-CM | POA: Diagnosis not present

## 2022-05-25 DIAGNOSIS — Z5189 Encounter for other specified aftercare: Secondary | ICD-10-CM | POA: Diagnosis not present

## 2022-05-25 DIAGNOSIS — R42 Dizziness and giddiness: Secondary | ICD-10-CM | POA: Diagnosis not present

## 2022-05-25 DIAGNOSIS — I4819 Other persistent atrial fibrillation: Secondary | ICD-10-CM | POA: Diagnosis not present

## 2022-05-25 DIAGNOSIS — Z952 Presence of prosthetic heart valve: Secondary | ICD-10-CM | POA: Diagnosis not present

## 2022-05-25 LAB — CUP PACEART INCLINIC DEVICE CHECK
Battery Remaining Longevity: 126 mo
Battery Voltage: 3.21 V
Brady Statistic AP VP Percent: 55.7 %
Brady Statistic AP VS Percent: 0 %
Brady Statistic AS VP Percent: 44.27 %
Brady Statistic AS VS Percent: 0.01 %
Brady Statistic RA Percent Paced: 53.02 %
Brady Statistic RV Percent Paced: 99.98 %
Date Time Interrogation Session: 20230921181450
Implantable Lead Implant Date: 20230908
Implantable Lead Implant Date: 20230908
Implantable Lead Location: 753859
Implantable Lead Location: 753860
Implantable Lead Model: 3830
Implantable Lead Model: 5076
Implantable Pulse Generator Implant Date: 20230908
Lead Channel Impedance Value: 304 Ohm
Lead Channel Impedance Value: 380 Ohm
Lead Channel Impedance Value: 456 Ohm
Lead Channel Impedance Value: 532 Ohm
Lead Channel Pacing Threshold Amplitude: 0.5 V
Lead Channel Pacing Threshold Amplitude: 1 V
Lead Channel Pacing Threshold Pulse Width: 0.4 ms
Lead Channel Pacing Threshold Pulse Width: 0.4 ms
Lead Channel Sensing Intrinsic Amplitude: 0.75 mV
Lead Channel Sensing Intrinsic Amplitude: 2.125 mV
Lead Channel Sensing Intrinsic Amplitude: 9.375 mV
Lead Channel Setting Pacing Amplitude: 3.5 V
Lead Channel Setting Pacing Amplitude: 3.5 V
Lead Channel Setting Pacing Pulse Width: 0.4 ms
Lead Channel Setting Sensing Sensitivity: 1.2 mV

## 2022-05-25 MED ORDER — POTASSIUM CHLORIDE CRYS ER 20 MEQ PO TBCR: 20.0000 meq | EXTENDED_RELEASE_TABLET | ORAL | 1 refills | Status: DC

## 2022-05-25 NOTE — Patient Instructions (Signed)
Medication Instructions:    START TAKING: POTASSIUM 20 MEQ EVERY OTHER DAY   *If you need a refill on your cardiac medications before your next appointment, please call your pharmacy*   Lab Work: NONE ORDERED  TODAY   If you have labs (blood work) drawn today and your tests are completely normal, you will receive your results only by: [place] (if you have MyChart) OR A paper copy in the mail If you have any lab test that is abnormal or we need to change your treatment, we will call you to review the results.   Testing/Procedures: NONE ORDERED  TODAY    Follow-Up: At [facility], you and your health needs are our priority.  As part of our continuing mission to provide you with exceptional heart care, we have created designated Provider Care Teams.  These Care Teams include your primary Cardiologist (physician) and Advanced Practice Providers (APPs -  Physician Assistants and Nurse Practitioners) who all work together to provide you with the care you need, when you need it.  We recommend signing up for the patient portal called "MyChart".  Sign up information is provided on this After Visit Summary.  MyChart is used to connect with patients for Virtual Visits (Telemedicine).  Patients are able to view lab/test results, encounter notes, upcoming appointments, etc.  Non-urgent messages can be sent to your provider as well.   To learn more about what you can do with MyChart, go to [email or web address].    Your next appointment:   AS SCHEDULED    The format for your next appointment:   In Person  Provider:   [name], MD      Important Information About Sugar

## 2022-05-25 NOTE — Telephone Encounter (Signed)
See note

## 2022-06-09 DIAGNOSIS — R197 Diarrhea, unspecified: Secondary | ICD-10-CM | POA: Diagnosis not present

## 2022-06-13 ENCOUNTER — Encounter: Payer: Medicare PPO | Admitting: Student

## 2022-06-14 ENCOUNTER — Other Ambulatory Visit: Payer: Self-pay

## 2022-06-14 ENCOUNTER — Inpatient Hospital Stay: Payer: Medicare PPO

## 2022-06-14 ENCOUNTER — Inpatient Hospital Stay: Payer: Medicare PPO | Attending: Hematology and Oncology

## 2022-06-14 DIAGNOSIS — D631 Anemia in chronic kidney disease: Secondary | ICD-10-CM | POA: Diagnosis not present

## 2022-06-14 DIAGNOSIS — D638 Anemia in other chronic diseases classified elsewhere: Secondary | ICD-10-CM

## 2022-06-14 DIAGNOSIS — N1832 Chronic kidney disease, stage 3b: Secondary | ICD-10-CM | POA: Diagnosis not present

## 2022-06-14 LAB — CMP (CANCER CENTER ONLY)
ALT: 26 U/L (ref 0–44)
AST: 21 U/L (ref 15–41)
Albumin: 3.4 g/dL — ABNORMAL LOW (ref 3.5–5.0)
Alkaline Phosphatase: 47 U/L (ref 38–126)
Anion gap: 7 (ref 5–15)
BUN: 36 mg/dL — ABNORMAL HIGH (ref 8–23)
CO2: 27 mmol/L (ref 22–32)
Calcium: 7.9 mg/dL — ABNORMAL LOW (ref 8.9–10.3)
Chloride: 106 mmol/L (ref 98–111)
Creatinine: 1.66 mg/dL — ABNORMAL HIGH (ref 0.61–1.24)
GFR, Estimated: 40 mL/min — ABNORMAL LOW (ref >60)
Glucose, Bld: 115 mg/dL — ABNORMAL HIGH (ref 70–99)
Potassium: 3.6 mmol/L (ref 3.5–5.1)
Sodium: 140 mmol/L (ref 135–145)
Total Bilirubin: 0.4 mg/dL (ref 0.3–1.2)
Total Protein: 5.8 g/dL — ABNORMAL LOW (ref 6.5–8.1)

## 2022-06-14 LAB — FERRITIN: Ferritin: 287 ng/mL (ref 24–336)

## 2022-06-14 LAB — CBC WITH DIFFERENTIAL (CANCER CENTER ONLY)
Abs Immature Granulocytes: 0.11 10*3/uL — ABNORMAL HIGH (ref 0.00–0.07)
Basophils Absolute: 0 10*3/uL (ref 0.0–0.1)
Basophils Relative: 0 %
Eosinophils Absolute: 0 10*3/uL (ref 0.0–0.5)
Eosinophils Relative: 0 %
HCT: 33.8 % — ABNORMAL LOW (ref 39.0–52.0)
Hemoglobin: 10.4 g/dL — ABNORMAL LOW (ref 13.0–17.0)
Immature Granulocytes: 1 %
Lymphocytes Relative: 3 %
Lymphs Abs: 0.3 10*3/uL — ABNORMAL LOW (ref 0.7–4.0)
MCH: 23.2 pg — ABNORMAL LOW (ref 26.0–34.0)
MCHC: 30.8 g/dL (ref 30.0–36.0)
MCV: 75.4 fL — ABNORMAL LOW (ref 80.0–100.0)
Monocytes Absolute: 0.3 10*3/uL (ref 0.1–1.0)
Monocytes Relative: 3 %
Neutro Abs: 9.7 10*3/uL — ABNORMAL HIGH (ref 1.7–7.7)
Neutrophils Relative %: 93 %
Platelet Count: 151 10*3/uL (ref 150–400)
RBC: 4.48 MIL/uL (ref 4.22–5.81)
RDW: 18.3 % — ABNORMAL HIGH (ref 11.5–15.5)
WBC Count: 10.4 10*3/uL (ref 4.0–10.5)
nRBC: 0.2 % (ref 0.0–0.2)

## 2022-06-14 LAB — IRON AND IRON BINDING CAPACITY (CC-WL,HP ONLY)
Iron: 33 ug/dL — ABNORMAL LOW (ref 45–182)
Saturation Ratios: 16 % — ABNORMAL LOW (ref 17.9–39.5)
TIBC: 203 ug/dL — ABNORMAL LOW (ref 250–450)
UIBC: 170 ug/dL (ref 117–376)

## 2022-06-14 LAB — SAMPLE TO BLOOD BANK

## 2022-06-14 NOTE — Progress Notes (Signed)
Electrophysiology Office Note Date: 06/15/2022  ID:  Joshua, Hudson July 25, 1935, MRN [id number]  PCP: Lavone Orn, MD Primary Cardiologist: [name], MD Electrophysiologist: [name], MD   CC: Pacemaker follow-up  Joshua Hudson is a 87 y.o. male seen today for [name], MD for routine electrophysiology followup.   Underwent PPM and AV nodal ablation 05/12/2022.  Seen by Joseph Art 9/21 and rate was already set to 70 so no change made. Torsemide reduced with orthostatic issues.   Since last being seen in our clinic the patient reports doing OK.  He has had some fatigue, but had COVID about 2 weeks ago. Went for CBC yesterday and did not require transfusion. he denies chest pain, palpitations, or undue dyspnea  Device History: MDT dual chamber PPM implanted 05/12/22 Followed by AV node ablation same day  Past Medical History:  Diagnosis Date   Achalasia 06/12/2019   Noted on EGD   Aortic insufficiency 03/24/2019   AI of bioprosthetic AVR severe 4 plus   Chronic diastolic CHF (congestive heart failure) ([place]) 03/24/2019   Colon polyps    s/p diverticular perforation requiring 2-stage repair   Derangement of right shoulder joint    need replacing has no use of   Diverticulosis    DJD (degenerative joint disease), lumbosacral    Dysphagia    eats soft food   Esophageal stricture    GERD (gastroesophageal reflux disease)    Hemolytic anemia ([place])    History of blood transfusion given with 04-15-19 surgery   History of blood transfusion 07/18/2019   History of kidney stones    passed stones   Hypertension    Mitral regurgitation    moderate   Occipital neuralgia    Pancytopenia (HCC)    Postoperative atrial fibrillation () 05/10/2015   Prosthetic valve dysfunction    Rheumatoid arthritis ([place])    s/o long term steroids  shoulders and hands   Rotator cuff arthropathy    right   S/P aortic valve replacement with bioprosthetic valve 01/16/2008   62m  Edwards Magna perimount bovine pericardial tissue valve, model 3000   S/P valve-in-valve TAVR 04/15/2019   26 mm Edwards Sapien 3 Ultra transcatheter heart valve placed via percutaneous right transfemoral approach    SBE (subacute bacterial endocarditis) prophylaxis candidate    for dental procedures   Schatzki's ring 06/12/2019   Narrowing, Noted on EGD   Severe aortic stenosis    S/P prosthetic valve replacement w 25 mm Edwards like science percardial tissue valve,[place] - 01/2008   Thrombocytopenia [place]    Past Surgical History:  Procedure Laterality Date   A-FLUTTER ABLATION N/A 10/10/2019   Procedure: A-FLUTTER ABLATION;  Surgeon: TEvans Lance MD;  [facility];  Service: Cardiovascular;  Laterality: N/A;   APPENDECTOMY     AV NODE ABLATION N/A 05/12/2022   Procedure: AV NODE ABLATION;  Surgeon: TEvans Lance MD;  [facility];  Service: Cardiovascular;  Laterality: N/A;   BALLOON DILATION N/A 06/12/2019   Procedure: BALLOON DILATION;  Surgeon: KRonnette Juniper MD;  [facility];  Service: Gastroenterology;  Laterality: N/A;   BOTOX INJECTION N/A 01/22/2018   Procedure: BOTOX INJECTION;  Surgeon: KRonnette Juniper MD;  [facility];  Service: Gastroenterology;  Laterality: N/A;   CARDIAC CATHETERIZATION     09   CARDIAC VALVE REPLACEMENT  01/2008   aortic valve replacement   CARDIOVERSION N/A 09/25/2019   Procedure: CARDIOVERSION;  Surgeon: SMarlou Porch  Thana Farr, MD;  Location: [place];  Service: Cardiovascular;  Laterality: N/A;   CARDIOVERSION N/A 12/25/2019   Procedure: CARDIOVERSION;  Surgeon: Lelon Perla, MD;  Location: [facility] ENDOSCOPY;  Service: Cardiovascular;  Laterality: N/A;   CARDIOVERSION N/A 03/08/2022   Procedure: CARDIOVERSION;  Surgeon: [name], MD;  Location: [facility] ENDOSCOPY;  Service: Cardiovascular;  Laterality: N/A;   CARDIOVERSION N/A 03/28/2022   Procedure: CARDIOVERSION;  Surgeon: Berniece Salines, DO;   Location: [place];  Service: Cardiovascular;  Laterality: N/A;   CATARACT EXTRACTION W/ INTRAOCULAR LENS  IMPLANT, BILATERAL     COLON RESECTION     diverticulitis    COLONOSCOPY N/A 03/03/2022   Procedure: COLONOSCOPY;  Surgeon: Otis Brace, MD;  [facility];  Service: Gastroenterology;  Laterality: N/A;   CORONARY ANGIOPLASTY     dental implants     permanent   ENTEROSCOPY N/A 03/03/2022   Procedure: ENTEROSCOPY;  Surgeon: Otis Brace, MD;  [facility];  Service: Gastroenterology;  Laterality: N/A;   ESOPHAGEAL MANOMETRY N/A 11/07/2017   Procedure: ESOPHAGEAL MANOMETRY (EM);  Surgeon: Ronnette Juniper, MD;  [facility];  Service: Gastroenterology;  Laterality: N/A;   ESOPHAGOGASTRODUODENOSCOPY N/A 02/24/2022   Procedure: ESOPHAGOGASTRODUODENOSCOPY (EGD);  Surgeon: Arta Silence, MD;  [facility];  Service: Gastroenterology;  Laterality: N/A;   ESOPHAGOGASTRODUODENOSCOPY (EGD) WITH PROPOFOL N/A 01/22/2018   Procedure: ESOPHAGOGASTRODUODENOSCOPY (EGD) WITH PROPOFOL;  Surgeon: Ronnette Juniper, MD;  [facility];  Service: Gastroenterology;  Laterality: N/A;   ESOPHAGOGASTRODUODENOSCOPY (EGD) WITH PROPOFOL N/A 04/30/2019   Procedure: ESOPHAGOGASTRODUODENOSCOPY (EGD) WITH PROPOFOL;  Surgeon: Laurence Spates, MD;  Location: [place];  Service: Endoscopy;  Laterality: N/A;   ESOPHAGOGASTRODUODENOSCOPY (EGD) WITH PROPOFOL N/A 06/12/2019   Procedure: ESOPHAGOGASTRODUODENOSCOPY (EGD) WITH PROPOFOL;  Surgeon: Ronnette Juniper, MD;  [facility];  Service: Gastroenterology;  Laterality: N/A;  with botox injection   EXCISIONAL TOTAL SHOULDER ARTHROPLASTY WITH ANTIBIOTIC SPACER Right 12/31/2018   Procedure: EXCISIONAL TOTAL SHOULDER ARTHROPLASTY WITH ANTIBIOTIC SPACER;  Surgeon: Netta Cedars, MD;  Location: [place];  Service: Orthopedics;  Laterality: Right;   GIVENS CAPSULE STUDY N/A 03/01/2022   Procedure: GIVENS CAPSULE STUDY;  Surgeon:  Otis Brace, MD;  [facility];  Service: Gastroenterology;  Laterality: N/A;   HEMOSTASIS CLIP PLACEMENT  03/03/2022   Procedure: HEMOSTASIS CLIP PLACEMENT;  Surgeon: Otis Brace, MD;  [facility];  Service: Gastroenterology;;   HERNIA REPAIR     HOT HEMOSTASIS N/A 03/03/2022   Procedure: HOT HEMOSTASIS (ARGON PLASMA COAGULATION/BICAP);  Surgeon: Otis Brace, MD;  Location[facility];  Service: Gastroenterology;  Laterality: N/A;  EGD and COLON   INTRAOPERATIVE TRANSTHORACIC ECHOCARDIOGRAM N/A 04/15/2019   Procedure: Intraoperative Transthoracic Echocardiogram;  Surgeon: Sherren Mocha, MD;  Location: [place];  Service: Open Heart Surgery;  Laterality: N/A;   IRRIGATION AND DEBRIDEMENT SHOULDER Right 11/20/2018    IRRIGATION AND DEBRIDEMENT SHOULDER WITH POLY EXCHANGE (Right Shoulder)   IRRIGATION AND DEBRIDEMENT SHOULDER Right 11/20/2018   Procedure: IRRIGATION AND DEBRIDEMENT SHOULDER WITH POLY EXCHANGE;  Surgeon: Netta Cedars, MD;  Location: [place];  Service: Orthopedics;  Laterality: Right;   LUMBAR LAMINECTOMY     x 2   PACEMAKER IMPLANT N/A 05/12/2022   Procedure: PACEMAKER IMPLANT;  Surgeon: Evans Lance, MD;  Location: [place] [facility];  Service: Cardiovascular;  Laterality: N/A;   POLYPECTOMY  03/03/2022   Procedure: POLYPECTOMY;  Surgeon: Otis Brace, MD;  [facility];  Service: Gastroenterology;;   REVERSE SHOULDER ARTHROPLASTY Right 03/01/2018  Procedure: RIGHT REVERSE SHOULDER ARTHROPLASTY;  Surgeon: Netta Cedars, MD;  Location: [place];  Service: Orthopedics;  Laterality: Right;   REVERSE SHOULDER ARTHROPLASTY Right 07/18/2019   Procedure: REVERSE TOTAL SHOULDER ARTHROPLASTY and removal of antiobotic spacer;  Surgeon: Netta Cedars, MD;  [facility];  Service: Orthopedics;  Laterality: Right;  interscalene block   REVERSE SHOULDER ARTHROPLASTY Right 08/06/2019   Procedure: Reduction of dislocated reverse total shoulder  and poly exchange;  Surgeon: Netta Cedars, MD;  [facility];  Service: Orthopedics;  Laterality: Right;  need 1 hour   RIGHT/LEFT HEART CATH AND CORONARY ANGIOGRAPHY N/A 04/02/2019   Procedure: RIGHT/LEFT HEART CATH AND CORONARY ANGIOGRAPHY;  Surgeon: Leonie Man, MD;  Location: [employer] [facility];  Service: Cardiovascular;  Laterality: N/A;   SAVORY DILATION N/A 01/22/2018   Procedure: SAVORY DILATION;  Surgeon: Ronnette Juniper, MD;  [facility];  Service: Gastroenterology;  Laterality: N/A;   SAVORY DILATION N/A 04/30/2019   Procedure: SAVORY DILATION;  Surgeon: Laurence Spates, MD;  Location: [place];  Service: Endoscopy;  Laterality: N/A;  With fluro   SHOULDER HEMI-ARTHROPLASTY Right 06/14/2018   Procedure: RIGHT  REVERSE TOTAL SHOULDER OPEN POLY EXCHANGE;  Surgeon: Netta Cedars, MD;  Location: [place];  Service: Orthopedics;  Laterality: Right;   SUBMUCOSAL INJECTION  06/12/2019   Procedure: SUBMUCOSAL INJECTION;  Surgeon: Ronnette Juniper, MD;  [facility];  Service: Gastroenterology;;   TEE WITHOUT CARDIOVERSION N/A 01/29/2014   Procedure: TRANSESOPHAGEAL ECHOCARDIOGRAM (TEE);  Surgeon: Sueanne Margarita, MD;  Location: [facility] ENDOSCOPY;  Service: Cardiovascular;  Laterality: N/A;   TEE WITHOUT CARDIOVERSION N/A 04/02/2019   Procedure: TRANSESOPHAGEAL ECHOCARDIOGRAM (TEE);  Surgeon: Josue Hector, MD;  Location: [facility];  Service: Cardiovascular;  Laterality: N/A;   TEE WITHOUT CARDIOVERSION N/A 09/25/2019   Procedure: TRANSESOPHAGEAL ECHOCARDIOGRAM (TEE);  Surgeon: Jerline Pain, MD;  Location: [facility]COPY;  Service: Cardiovascular;  Laterality: N/A;   TEE WITHOUT CARDIOVERSION N/A 03/08/2022   Procedure: TRANSESOPHAGEAL ECHOCARDIOGRAM (TEE);  Surgeon: [name], MD;  Location: [facility] ENDOSCOPY;  Service: Cardiovascular;  Laterality: N/A;   TONSILLECTOMY     TRANSCATHETER AORTIC VALVE REPLACEMENT, TRANSFEMORAL  04/15/2019   TRANSCATHETER AORTIC VALVE  REPLACEMENT, TRANSFEMORAL N/A 04/15/2019   Procedure: TRANSCATHETER AORTIC VALVE REPLACEMENT, TRANSFEMORAL with POST BALLOON DILATION;  Surgeon: Sherren Mocha, MD;  Location: [place];  Service: Open Heart Surgery;  Laterality: N/A;    Current Outpatient Medications  Medication Sig Dispense Refill   apixaban (ELIQUIS) 2.5 MG TABS tablet Take 1 tablet (2.5 mg total) by mouth 2 (two) times daily. 180 tablet 1   cholestyramine (QUESTRAN) 4 g packet Take 4 g by mouth daily.     epoetin alfa-epbx (RETACRIT) 52841 UNIT/ML injection Inject 40,000 Units into the skin every 6 (six) weeks. As needed     finasteride (PROSCAR) 5 MG tablet Take 5 mg by mouth daily.     fluticasone (FLONASE) 50 MCG/ACT nasal spray Place 1 spray into both nostrils daily as needed for allergies or rhinitis.     folic acid (FOLVITE) 1 MG tablet Take 1 mg by mouth daily.     ketotifen (ZADITOR) 0.025 % ophthalmic solution Place 1 drop into both eyes 2 (two) times daily as needed (itchy eyes).     levothyroxine (SYNTHROID) 88 MCG tablet Take 88 mcg by mouth every morning.     mineral oil-hydrophilic petrolatum (AQUAPHOR) ointment Apply 1 Application topically as needed (wound care).     Multiple Vitamins-Minerals (MULTIVITAMIN WITH  MINERALS) tablet Take 1 tablet by mouth daily.     potassium chloride SA (KLOR-CON M) 20 MEQ tablet Take 1 tablet (20 mEq total) by mouth every other day. 45 tablet 1   predniSONE (DELTASONE) 5 MG tablet Take 5 mg by mouth daily with breakfast. TAKING '15mg'$  QD FOR 3Wks     torsemide (DEMADEX) 20 MG tablet Take 1 tablet (20 mg total) by mouth daily. 30 tablet 0   pantoprazole (PROTONIX) 40 MG tablet Take 1 tablet (40 mg total) by mouth 2 (two) times daily before a meal for 30 days, THEN 1 tablet (40 mg total) daily. (Patient taking differently: Take 1 tablet (40 mg total) daily.) 120 tablet 0   No current facility-administered medications for this visit.    Allergies:   Patient has no known allergies.    Social History: Social History   Socioeconomic History   Marital status: Widowed    Spouse name: Not on file   Number of children: 2   Years of education: Not on file   Highest education level: Not on file  Occupational History   Occupation: Retired Market researcher at [employer] Use   Smoking status: Former    Packs/day: 0.50    Years: 10.00    Total pack years: 5.00    Types: Cigarettes    Start date: 01/16/1974    Quit date: 09/05/1983    Years since quitting: 38.8   Smokeless tobacco: Former    Types: Nurse, children's Use: Never used  Substance and Sexual Activity   Alcohol use: Not Currently    Alcohol/week: 0.0 standard drinks of alcohol   Drug use: No   Sexual activity: Not on file  Other Topics Concern   Not on file  Social History Narrative   Not on file   Social Determinants of Health   Financial Resource Strain: Not on file  Food Insecurity: Not on file  Transportation Needs: Not on file  Physical Activity: Not on file  Stress: Not on file  Social Connections: Not on file  Intimate Partner Violence: Not on file    Family History: Family History  Problem Relation Age of Onset   Other Mother        NO HEALTH PROBLEMS   Heart disease Father    Arthritis Father    Heart Problems Sister        RELATED TO A MVA   Suicidality Brother    Other Sister        3 SISTERS IN GOOD HEALTH   Other Brother        2 [place]   Other Daughter        2 IN GOOD HEALTH     Review of Systems: All other systems reviewed and are otherwise negative except as noted above.  Physical Exam: Vitals:   06/15/22 0805  BP: 118/72  Pulse: 77  SpO2: 93%  Weight: 97 lb (44 kg)  Height: '5\' 3"'$  (1.6 m)     GEN- The patient is well appearing, alert and oriented x 3 today.   HEENT: normocephalic, atraumatic; sclera clear, conjunctiva pink; hearing intact; oropharynx clear; neck supple, no JVP Lymph- no cervical lymphadenopathy Lungs- Clear to  ausculation bilaterally, normal work of breathing.  No wheezes, rales, rhonchi Heart- Regular  rate and rhythm, no murmurs, rubs or gallops, PMI not laterally displaced GI- soft, non-tender, non-distended, bowel sounds present, no hepatosplenomegaly Extremities- no clubbing or cyanosis. No  peripheral edema; DP/PT/radial pulses 2+ bilaterally MS- no significant deformity or atrophy Skin- warm and dry, no rash or lesion; PPM pocket well healed Psych- euthymic mood, full affect Neuro- strength and sensation are intact  PPM Interrogation-  reviewed in detail today,  See PACEART report.  EKG:  EKG is not ordered today.  Recent Labs: 02/24/2022: TSH 0.691 02/28/2022: B Natriuretic Peptide 256.0 03/10/2022: Magnesium 2.0 06/14/2022: ALT 26; BUN 36; Creatinine 1.66; Hemoglobin 10.4; Platelet Count 151; Potassium 3.6; Sodium 140   Wt Readings from Last 3 Encounters:  06/15/22 97 lb (44 kg)  05/25/22 97 lb (44 kg)  05/12/22 102 lb (46.3 kg)     Other studies Reviewed: Additional studies/ records that were reviewed today include: Previous EP office notes, Previous remote checks, Most recent labwork.   Assessment and Plan:  1. Uncontrolled atrial arrhyhtmia s/p AV node ablation s/p Medtronic PPM  Normal PPM function See Pace Art report LRL changed to 60 and rate response turned on  2. Persistent atrial fibrillation Back in AF today, pacer dependent and not conducting s/p AV nodal ablation as above.  Continue eliquis for CHA2DS2VASc  of at least 4 Chronic anemia has been problematic. Follow  3. Valvular heart disease    S/p AVR 2009 >>> TAVR 2020     Functioning well by TEE 03/08/2022     C/w Dr. Radford Pax, and structural heart team  4. Systolic CHF with recovered LVEF 5. NICM EF 50-55% 03/08/2022 by TEE  Current medicines are reviewed at length with the patient today.    Disposition:   Follow up with Dr. Lovena Le in 3 months    Signed, Shirley Friar, PA-C  06/15/2022 8:08  AM  [facility] [address] [place] [address] [phone] (office) [phone] (fax)

## 2022-06-15 ENCOUNTER — Encounter: Payer: Self-pay | Admitting: Student

## 2022-06-15 ENCOUNTER — Ambulatory Visit: Payer: Medicare PPO | Attending: Student | Admitting: Student

## 2022-06-15 VITALS — BP 118/72 | HR 77 | Ht 63.0 in | Wt 97.0 lb

## 2022-06-15 DIAGNOSIS — I480 Paroxysmal atrial fibrillation: Secondary | ICD-10-CM

## 2022-06-15 DIAGNOSIS — I428 Other cardiomyopathies: Secondary | ICD-10-CM

## 2022-06-15 LAB — CUP PACEART INCLINIC DEVICE CHECK
Battery Remaining Longevity: 92 mo
Battery Voltage: 3.2 V
Brady Statistic AP VP Percent: 91.54 %
Brady Statistic AP VS Percent: 0 %
Brady Statistic AS VP Percent: 8.4 %
Brady Statistic AS VS Percent: 0.03 %
Brady Statistic RA Percent Paced: 35.85 %
Brady Statistic RV Percent Paced: 99.96 %
Date Time Interrogation Session: 20231012082502
Implantable Lead Implant Date: 20230908
Implantable Lead Implant Date: 20230908
Implantable Lead Location: 753859
Implantable Lead Location: 753860
Implantable Lead Model: 3830
Implantable Lead Model: 5076
Implantable Pulse Generator Implant Date: 20230908
Lead Channel Impedance Value: 285 Ohm
Lead Channel Impedance Value: 361 Ohm
Lead Channel Impedance Value: 475 Ohm
Lead Channel Impedance Value: 513 Ohm
Lead Channel Pacing Threshold Amplitude: 0.75 V
Lead Channel Pacing Threshold Amplitude: 0.75 V
Lead Channel Pacing Threshold Pulse Width: 0.4 ms
Lead Channel Pacing Threshold Pulse Width: 0.4 ms
Lead Channel Sensing Intrinsic Amplitude: 1.25 mV
Lead Channel Sensing Intrinsic Amplitude: 1.25 mV
Lead Channel Sensing Intrinsic Amplitude: 9.375 mV
Lead Channel Setting Pacing Amplitude: 3.5 V
Lead Channel Setting Pacing Amplitude: 3.5 V
Lead Channel Setting Pacing Pulse Width: 0.4 ms
Lead Channel Setting Sensing Sensitivity: 1.2 mV

## 2022-06-15 NOTE — Patient Instructions (Signed)
Medication Instructions:  Your physician recommends that you continue on your current medications as directed. Please refer to the Current Medication list given to you today.  *If you need a refill on your cardiac medications before your next appointment, please call your pharmacy*   Lab Work: None  If you have labs (blood work) drawn today and your tests are completely normal, you will receive your results only by: MyChart Message (if you have MyChart) OR A paper copy in the mail If you have any lab test that is abnormal or we need to change your treatment, we will call you to review the results.   Follow-Up: At [facility], you and your health needs are our priority.  As part of our continuing mission to provide you with exceptional heart care, we have created designated Provider Care Teams.  These Care Teams include your primary Cardiologist (physician) and Advanced Practice Providers (APPs -  Physician Assistants and Nurse Practitioners) who all work together to provide you with the care you need, when you need it.   Your next appointment:   As scheduled  Important Information About [place]       

## 2022-06-27 DIAGNOSIS — H353211 Exudative age-related macular degeneration, right eye, with active choroidal neovascularization: Secondary | ICD-10-CM | POA: Diagnosis not present

## 2022-06-30 ENCOUNTER — Telehealth: Payer: Self-pay

## 2022-06-30 NOTE — Patient Instructions (Signed)
Visit Information  Thank you for taking time to visit with me today. Please don't hesitate to contact me if I can be of assistance to you.   Following are the goals we discussed today:   Goals Addressed             This Visit's Progress    COMPLETED: Care Coordination Activities - no follow up required       Care Coordination Interventions: Provided education to patient re: care coordination services, Annual Wellness Visit Assessed social determinant of health barriers Completed  Annual Wellness Visit 12/28/21         If you are experiencing a Mental Health or [place] or need someone to talk to, please call the Suicide and Crisis Lifeline: 988 call the [place] National Suicide Prevention Lifeline: 772-412-4551 or TTY: (919)878-2817 TTY (814) 075-3272) to talk to a trained counselor call 1-800-273-TALK (toll free, 24 hour hotline) call 911   The patient verbalized understanding of instructions, educational materials, and care plan provided today and DECLINED offer to receive copy of patient instructions, educational materials, and care plan.   No further follow up required:    Peter Garter RN, Jackquline Denmark, [place] [facility] [phone]

## 2022-06-30 NOTE — Patient Outreach (Signed)
  Care Coordination   06/30/2022 Name: Joshua Hudson MRN: [id number] DOB: 06/07/1935   Care Coordination Outreach Attempts:  An unsuccessful telephone outreach was attempted today to offer the patient information about available care coordination services as a benefit of their health plan.   Follow Up Plan:  Additional outreach attempts will be made to offer the patient care coordination information and services.   Encounter Outcome:  No Answer  Care Coordination Interventions Activated:  No   Care Coordination Interventions:  No, not indicated    Peter Garter RN, BSN,CCM, [place] [facility] [phone]

## 2022-06-30 NOTE — Patient Outreach (Signed)
  Care Coordination   Initial Visit Note   06/30/2022 Name: Joshua Hudson MRN: [id number] DOB: 1934-10-17  Joshua Hudson is a 87 y.o. year old male who sees Joshua Orn, MD for primary care. I  spoke with daughter DPR Joshua Hudson   What matters to the patients health and wellness today?  No concerns today.  States Father is doing well after his pacemaker     Goals Addressed             This Visit's Progress    COMPLETED: Care Coordination Activities - no follow up required       Care Coordination Interventions: Provided education to patient re: care coordination services, Annual Wellness Visit Assessed social determinant of health barriers Completed  Annual Wellness Visit 12/28/21          SDOH assessments and interventions completed:  Yes  SDOH Interventions Today    Flowsheet Row Most Recent Value  SDOH Interventions   Food Insecurity Interventions Intervention Not Indicated  Housing Interventions Intervention Not Indicated  Utilities Interventions Intervention Not Indicated  Financial Strain Interventions Intervention Not Indicated        Care Coordination Interventions Activated:  Yes  Care Coordination Interventions:  Yes, provided   Follow up plan: No further intervention required.   Encounter Outcome:  Pt. Visit Completed  Joshua Garter RN, BSN,CCM, CDE Care Management Coordinator [place] [facility] [phone]

## 2022-07-01 NOTE — Progress Notes (Signed)
Patient Care Team: Lavone Orn, MD as PCP - General (Internal Medicine) Sueanne Margarita, MD as PCP - Cardiology (Cardiology) Evans Lance, MD as PCP - Electrophysiology (Cardiology) Sueanne Margarita, MD as Consulting Physician (Cardiology) Nicholas Lose, MD as Consulting Physician (Hematology and Oncology)  DIAGNOSIS: No diagnosis found.  SUMMARY OF ONCOLOGIC HISTORY: Oncology History   No history exists.    CHIEF COMPLIANT: Follow-up of anemia, thrombocytopenia  INTERVAL HISTORY: Joshua Hudson is a  87 y.o. with above-mentioned history of anemia and thrombocytopenia currently on treatment with Aranesp. He presents to the clinic today for labs and a follow-up.   ALLERGIES:  has No Known Allergies.  MEDICATIONS:  Current Outpatient Medications  Medication Sig Dispense Refill   apixaban (ELIQUIS) 2.5 MG TABS tablet Take 1 tablet (2.5 mg total) by mouth 2 (two) times daily. 180 tablet 1   cholestyramine (QUESTRAN) 4 g packet Take 4 g by mouth daily.     epoetin alfa-epbx (RETACRIT) 38937 UNIT/ML injection Inject 40,000 Units into the skin every 6 (six) weeks. As needed     finasteride (PROSCAR) 5 MG tablet Take 5 mg by mouth daily.     fluticasone (FLONASE) 50 MCG/ACT nasal spray Place 1 spray into both nostrils daily as needed for allergies or rhinitis.     folic acid (FOLVITE) 1 MG tablet Take 1 mg by mouth daily.     ketotifen (ZADITOR) 0.025 % ophthalmic solution Place 1 drop into both eyes 2 (two) times daily as needed (itchy eyes).     levothyroxine (SYNTHROID) 88 MCG tablet Take 88 mcg by mouth every morning.     mineral oil-hydrophilic petrolatum (AQUAPHOR) ointment Apply 1 Application topically as needed (wound care).     Multiple Vitamins-Minerals (MULTIVITAMIN WITH MINERALS) tablet Take 1 tablet by mouth daily.     pantoprazole (PROTONIX) 40 MG tablet Take 1 tablet (40 mg total) by mouth 2 (two) times daily before a meal for 30 days, THEN 1 tablet (40 mg total)  daily. (Patient taking differently: Take 1 tablet (40 mg total) daily.) 120 tablet 0   potassium chloride SA (KLOR-CON M) 20 MEQ tablet Take 1 tablet (20 mEq total) by mouth every other day. 45 tablet 1   predniSONE (DELTASONE) 5 MG tablet Take 5 mg by mouth daily with breakfast. TAKING '15mg'$  QD FOR 3Wks     torsemide (DEMADEX) 20 MG tablet Take 1 tablet (20 mg total) by mouth daily. 30 tablet 0   No current facility-administered medications for this visit.    PHYSICAL EXAMINATION: ECOG PERFORMANCE STATUS: {CHL ONC ECOG [phone]}  There were no vitals filed for this visit. There were no vitals filed for this visit.  BREAST:*** No palpable masses or nodules in either right or left breasts. No palpable axillary supraclavicular or infraclavicular adenopathy no breast tenderness or nipple discharge. (exam performed in the presence of a chaperone)  LABORATORY DATA:  I have reviewed the data as listed    Latest Ref Rng & Units 06/14/2022    8:10 AM 05/24/2022    8:06 AM 05/12/2022    8:11 AM  CMP  Glucose 70 - 99 mg/dL 115  114  79   BUN 8 - 23 mg/dL 36  43  30   Creatinine 0.61 - 1.24 mg/dL 1.66  2.32  2.08   Sodium 135 - 145 mmol/L 140  140  140   Potassium 3.5 - 5.1 mmol/L 3.6  4.5  3.9   Chloride 98 -  111 mmol/L 106  104  103   CO2 22 - 32 mmol/L '27  30  25   '$ Calcium 8.9 - 10.3 mg/dL 7.9  9.2  9.3   Total Protein 6.5 - 8.1 g/dL 5.8  5.7    Total Bilirubin 0.3 - 1.2 mg/dL 0.4  0.5    Alkaline Phos 38 - 126 U/L 47  38    AST 15 - 41 U/L 21  20    ALT 0 - 44 U/L 26  13      Lab Results  Component Value Date   WBC 10.4 06/14/2022   HGB 10.4 (L) 06/14/2022   HCT 33.8 (L) 06/14/2022   MCV 75.4 (L) 06/14/2022   PLT 151 06/14/2022   NEUTROABS 9.7 (H) 06/14/2022    ASSESSMENT & PLAN:  No problem-specific Assessment & Plan notes found for this encounter.    No orders of the defined types were placed in this encounter.  The patient has a good understanding of the overall  plan. he agrees with it. he will call with any problems that may develop before the next visit here. Total time spent: 30 mins including face to face time and time spent for planning, charting and co-ordination of care   Suzzette Righter, [place] 07/01/22    I Gardiner Coins am scribing for Dr. Lindi Adie  ***

## 2022-07-05 ENCOUNTER — Inpatient Hospital Stay: Payer: Medicare PPO | Attending: Hematology and Oncology | Admitting: Hematology and Oncology

## 2022-07-05 ENCOUNTER — Other Ambulatory Visit: Payer: Self-pay

## 2022-07-05 ENCOUNTER — Inpatient Hospital Stay: Payer: Medicare PPO

## 2022-07-05 VITALS — BP 115/73 | HR 89 | Temp 97.2°F | Resp 18 | Ht 63.0 in | Wt 102.8 lb

## 2022-07-05 DIAGNOSIS — I4891 Unspecified atrial fibrillation: Secondary | ICD-10-CM | POA: Insufficient documentation

## 2022-07-05 DIAGNOSIS — D696 Thrombocytopenia, unspecified: Secondary | ICD-10-CM | POA: Diagnosis not present

## 2022-07-05 DIAGNOSIS — D638 Anemia in other chronic diseases classified elsewhere: Secondary | ICD-10-CM

## 2022-07-05 DIAGNOSIS — Z7901 Long term (current) use of anticoagulants: Secondary | ICD-10-CM | POA: Insufficient documentation

## 2022-07-05 DIAGNOSIS — D563 Thalassemia minor: Secondary | ICD-10-CM | POA: Diagnosis not present

## 2022-07-05 DIAGNOSIS — D598 Other acquired hemolytic anemias: Secondary | ICD-10-CM

## 2022-07-05 LAB — CBC WITH DIFFERENTIAL (CANCER CENTER ONLY)
Abs Immature Granulocytes: 0.22 10*3/uL — ABNORMAL HIGH (ref 0.00–0.07)
Basophils Absolute: 0 10*3/uL (ref 0.0–0.1)
Basophils Relative: 0 %
Eosinophils Absolute: 0.1 10*3/uL (ref 0.0–0.5)
Eosinophils Relative: 1 %
HCT: 31.5 % — ABNORMAL LOW (ref 39.0–52.0)
Hemoglobin: 9.7 g/dL — ABNORMAL LOW (ref 13.0–17.0)
Immature Granulocytes: 2 %
Lymphocytes Relative: 5 %
Lymphs Abs: 0.7 10*3/uL (ref 0.7–4.0)
MCH: 22.7 pg — ABNORMAL LOW (ref 26.0–34.0)
MCHC: 30.8 g/dL (ref 30.0–36.0)
MCV: 73.8 fL — ABNORMAL LOW (ref 80.0–100.0)
Monocytes Absolute: 0.6 10*3/uL (ref 0.1–1.0)
Monocytes Relative: 4 %
Neutro Abs: 13 10*3/uL — ABNORMAL HIGH (ref 1.7–7.7)
Neutrophils Relative %: 88 %
Platelet Count: 98 10*3/uL — ABNORMAL LOW (ref 150–400)
RBC: 4.27 MIL/uL (ref 4.22–5.81)
RDW: 18.8 % — ABNORMAL HIGH (ref 11.5–15.5)
WBC Count: 14.6 10*3/uL — ABNORMAL HIGH (ref 4.0–10.5)
nRBC: 0.5 % — ABNORMAL HIGH (ref 0.0–0.2)

## 2022-07-05 LAB — CMP (CANCER CENTER ONLY)
ALT: 12 U/L (ref 0–44)
AST: 19 U/L (ref 15–41)
Albumin: 3.6 g/dL (ref 3.5–5.0)
Alkaline Phosphatase: 51 U/L (ref 38–126)
Anion gap: 7 (ref 5–15)
BUN: 35 mg/dL — ABNORMAL HIGH (ref 8–23)
CO2: 26 mmol/L (ref 22–32)
Calcium: 8.2 mg/dL — ABNORMAL LOW (ref 8.9–10.3)
Chloride: 107 mmol/L (ref 98–111)
Creatinine: 1.75 mg/dL — ABNORMAL HIGH (ref 0.61–1.24)
GFR, Estimated: 38 mL/min — ABNORMAL LOW (ref >60)
Glucose, Bld: 83 mg/dL (ref 70–99)
Potassium: 3.7 mmol/L (ref 3.5–5.1)
Sodium: 140 mmol/L (ref 135–145)
Total Bilirubin: 0.5 mg/dL (ref 0.3–1.2)
Total Protein: 5.8 g/dL — ABNORMAL LOW (ref 6.5–8.1)

## 2022-07-05 LAB — IRON AND IRON BINDING CAPACITY (CC-WL,HP ONLY)
Iron: 110 ug/dL (ref 45–182)
Saturation Ratios: 43 % — ABNORMAL HIGH (ref 17.9–39.5)
TIBC: 256 ug/dL (ref 250–450)
UIBC: 146 ug/dL (ref 117–376)

## 2022-07-05 LAB — SAMPLE TO BLOOD BANK

## 2022-07-05 LAB — FERRITIN: Ferritin: 173 ng/mL (ref 24–336)

## 2022-07-05 LAB — VITAMIN B12: Vitamin B-12: 320 pg/mL (ref 180–914)

## 2022-07-05 NOTE — Assessment & Plan Note (Signed)
Hemoglobin electrophoresis: Beta thalassemia minor.This is the cause of microcytosis. It is not iron deficiency related.  Possible differential:Anemia of chronic disease versus myelodysplastic syndrome versus methotrexate related toxicity.   Hospitalization 09/22/2019-09/25/2019: A. fib with RVR status post cardioversion, currently on Eliquis Hospitalization 02/28/2022-03/11/2022: Atrial fibrillation: Cardioversion planned for 03/22/2022.  Lab review: 09/25/2019: WBC 7.2, hemoglobin 9.6, platelets 145 10/03/19: WBC: 7.5, Hb 8.9, Pl: 135 04/18/2021: Hemoglobin 8.7 (Retacrit), blood transfusion on 04/04/2021 for hemoglobin of 7.7 05/30/21:Hemoglobin 6.7, blood transfusion, platelets 85 (will need to be watched) 08/23/2021: Hemoglobin 9.6 (Retacrit) 10/04/2021: Hemoglobin 8, platelets 151 (2 units of PRBC) 12/05/2021: Hemoglobin 8.7 12/26/2021:Hemoglobin 7.1, platelets 118 03/20/2022: Hemoglobin 7.1, platelets 127 (2 units of PRBC) 04/14/22: Hb 7.8, Platelets 91 (PRBC) 05/03/22: Hemoglobin 8.8 (because he feels so weak we are transfusing 1 unit of PRBC)  06/14/22: Hb 10.4, Pl 151, Cr 1.66, Iron sat: 16%, Ferritin 287 07/05/22:   05/12/22: Ablation and Pacemaker Implantation

## 2022-07-10 DIAGNOSIS — Q253 Supravalvular aortic stenosis: Secondary | ICD-10-CM | POA: Diagnosis not present

## 2022-07-10 DIAGNOSIS — R5382 Chronic fatigue, unspecified: Secondary | ICD-10-CM | POA: Diagnosis not present

## 2022-07-10 DIAGNOSIS — K529 Noninfective gastroenteritis and colitis, unspecified: Secondary | ICD-10-CM | POA: Diagnosis not present

## 2022-07-10 DIAGNOSIS — M069 Rheumatoid arthritis, unspecified: Secondary | ICD-10-CM | POA: Diagnosis not present

## 2022-07-10 DIAGNOSIS — N1832 Chronic kidney disease, stage 3b: Secondary | ICD-10-CM | POA: Diagnosis not present

## 2022-07-10 DIAGNOSIS — K222 Esophageal obstruction: Secondary | ICD-10-CM | POA: Diagnosis not present

## 2022-07-10 DIAGNOSIS — M8580 Other specified disorders of bone density and structure, unspecified site: Secondary | ICD-10-CM | POA: Diagnosis not present

## 2022-07-10 DIAGNOSIS — D509 Iron deficiency anemia, unspecified: Secondary | ICD-10-CM | POA: Diagnosis not present

## 2022-07-11 ENCOUNTER — Ambulatory Visit: Payer: Medicare PPO | Attending: Cardiology | Admitting: Cardiology

## 2022-07-11 ENCOUNTER — Encounter: Payer: Self-pay | Admitting: Cardiology

## 2022-07-11 VITALS — BP 138/82 | HR 85 | Ht 62.0 in | Wt 101.4 lb

## 2022-07-11 DIAGNOSIS — R0602 Shortness of breath: Secondary | ICD-10-CM | POA: Diagnosis not present

## 2022-07-11 DIAGNOSIS — I350 Nonrheumatic aortic (valve) stenosis: Secondary | ICD-10-CM

## 2022-07-11 DIAGNOSIS — I2510 Atherosclerotic heart disease of native coronary artery without angina pectoris: Secondary | ICD-10-CM

## 2022-07-11 DIAGNOSIS — R600 Localized edema: Secondary | ICD-10-CM

## 2022-07-11 DIAGNOSIS — I351 Nonrheumatic aortic (valve) insufficiency: Secondary | ICD-10-CM

## 2022-07-11 DIAGNOSIS — I4891 Unspecified atrial fibrillation: Secondary | ICD-10-CM

## 2022-07-11 DIAGNOSIS — I5042 Chronic combined systolic (congestive) and diastolic (congestive) heart failure: Secondary | ICD-10-CM

## 2022-07-11 NOTE — Progress Notes (Signed)
Date:  07/11/2022   ID:  Joshua Hudson, DOB 06-30-1935, MRN [id number]  Patient Location:  Home  Provider location:   [place]  PCP:  Lavone Orn, MD  Cardiologist:  [name], MD  Electrophysiologist:  [name], MD   Chief Complaint:  AS, afib, CAD  History of Present Illness:    Joshua Hudson is a 87 y.o. male  with a history of severe AS s/p AVR with a 26 mm Edwards bioprosthetic valve in 2009 with post op afib (with no recurrence), HTN, rheumatoid arthritis on MTX, leflunomide and prednisone, achalasia, mod MR, and chronic diastolic CHF, hemolytic anemia, bioprosthetic valve failure with severe AI s/p valve-in-valve TAVR (04/15/19).    Patient underwent aortic valve replacement using a 25 mm Edwards magna stented bovine pericardial tissue valve by Dr. Servando Snare in 2009 for severe symptomatic aortic stenosis.  His early postoperative recovery was notable for postoperative atrial fibrillation.  He was seen by Estella Husk- PA in the office on 03/18/2019 for preoperative clearance for redo shoulder arthroplasty. He was noted to have significant lower extremity edema as well as other signs and symptoms of acute heart failure. Follow-up TTE on 03/18/2019 revealed normal left ventricular systolic function with at least moderate AI.  Subsequent TEE on 04/02/2019 confirmed the presence of severe prosthetic valve dysfunction with severe AI.  There was no vegetation on the aortic valve she suggest endocarditis. Cardiac catheterization on 04/02/2019 showed normal coronaries. He was also noted to have worsening anemia. Haptoglobin was low c/w hemolytic anemia.    He underwent successful valve in valve TAVR with a 26 mm Edwards Sapien Ultra THV via the TF approach on 04/15/19. Post operative echo showed EF 60-65%, normally functioning TAVR with mean gradient of 14 mmHg and no AI.   He was found to be in aflutter with RVR on 09/22/2019 when he presented for esophageal dilatation.  He had  been having fatigue, DOE and LE edema.  2D ecoh showed moderately reduced LVF.  He was started on Eliquis and underwent successful TEE/DCCV to NSR.  2D echo showed EF 45-50% felt to be tachy mediated.  He was seen by EP and underwent aflutter ablation.  He was back in aflutter at [place] on 11/11/19 and started on Amio.  He underwent DCCV to NSR on 12/25/2019.  He had a GIB in June 2023 and started on PPI and Eliquis was held for 1 week.  He went back into atrial fibrillation with RVR in March 10 2022 and underwent TEE/DDCV to NSR.  His EF was 50-55% at that time.  He then went back into afigb and underwent DCCV on 03/28/22.  He has felt poorly since then.  On 05/12/22 he underwent AVN ablation with PPM and is followed by Dr. Lovena Le.   He is here today for followup and is doing well.  He denies any chest pain or pressure, PND, orthopnea, palpitations or syncope. Unfortunately he continues to have chronic fatigue with no energy.  He still has chronic DOE that he thinks has gotten worse recently.  He also has chronic LE edema that he thinks has gotten worse as well.  He has dizzy spells at times but no syncope. He is compliant with his meds and is tolerating meds with no SE.    Prior CV studies:   The following studies were reviewed today:  Hospital notes, TEE, DCCV notes, EP consult note  Past Medical History:  Diagnosis Date   Achalasia 06/12/2019   Noted  on EGD   Aortic insufficiency 03/24/2019   AI of bioprosthetic AVR severe 4 plus   Chronic diastolic CHF (congestive heart failure) ([place]) 03/24/2019   Colon polyps    s/p diverticular perforation requiring 2-stage repair   Derangement of right shoulder joint    need replacing has no use of   Diverticulosis    DJD (degenerative joint disease), lumbosacral    Dysphagia    eats soft food   Esophageal stricture    GERD (gastroesophageal reflux disease)    Hemolytic anemia ([place])    History of blood transfusion given with 04-15-19 surgery   History of  blood transfusion 07/18/2019   History of kidney stones    passed stones   Hypertension    Mitral regurgitation    moderate   Occipital neuralgia    Pancytopenia (HCC)    Postoperative atrial fibrillation ([place]) 05/10/2015   Prosthetic valve dysfunction    Rheumatoid arthritis (HCC)    s/o long term steroids  shoulders and hands   Rotator cuff arthropathy    right   S/P aortic valve replacement with bioprosthetic valve 01/16/2008   56m Edwards Magna perimount bovine pericardial tissue valve, model 3000   S/P valve-in-valve TAVR 04/15/2019   26 mm Edwards Sapien 3 Ultra transcatheter heart valve placed via percutaneous right transfemoral approach    SBE (subacute bacterial endocarditis) prophylaxis candidate    for dental procedures   Schatzki's ring 06/12/2019   Narrowing, Noted on EGD   Severe aortic stenosis    S/P prosthetic valve replacement w 25 mm Edwards like science percardial tissue valve,Syrena Burges - 01/2008   [place]rombocytopenia [place]    Past Surgical History:  Procedure Laterality Date   A-FLUTTER ABLATION N/A 10/10/2019   Procedure: A-FLUTTER ABLATION;  Surgeon: TEvans Lance MD;  [facility];  Service: Cardiovascular;  Laterality: N/A;   APPENDECTOMY     AV NODE ABLATION N/A 05/12/2022   Procedure: AV NODE ABLATION;  Surgeon: TEvans Lance MD;  [facility];  Service: Cardiovascular;  Laterality: N/A;   BALLOON DILATION N/A 06/12/2019   Procedure: BALLOON DILATION;  Surgeon: KRonnette Juniper MD;  [facility];  Service: Gastroenterology;  Laterality: N/A;   BOTOX INJECTION N/A 01/22/2018   Procedure: BOTOX INJECTION;  Surgeon: KRonnette Juniper MD;  [facility];  Service: Gastroenterology;  Laterality: N/A;   CARDIAC CATHETERIZATION     09   CARDIAC VALVE REPLACEMENT  01/2008   aortic valve replacement   CARDIOVERSION N/A 09/25/2019   Procedure: CARDIOVERSION;  Surgeon: SJerline Pain MD;  [facility] [facility]  Service:  Cardiovascular;  Laterality: N/A;   CARDIOVERSION N/A 12/25/2019   Procedure: CARDIOVERSION;  Surgeon: CLelon Perla MD;  [facility] [facility]  Service: Cardiovascular;  Laterality: N/A;   CARDIOVERSION N/A 03/08/2022   Procedure: CARDIOVERSION;  Surgeon: PFreada Bergeron MD;  [facility];  Service: Cardiovascular;  Laterality: N/A;   CARDIOVERSION N/A 03/28/2022   Procedure: CARDIOVERSION;  Surgeon: TBerniece Salines DO;  [place] [facility]  Service: Cardiovascular;  Laterality: N/A;   CATARACT EXTRACTION W/ INTRAOCULAR LENS  IMPLANT, BILATERAL     COLON RESECTION     diverticulitis    COLONOSCOPY N/A 03/03/2022   Procedure: COLONOSCOPY;  Surgeon: BOtis Brace MD;  [facility];  Service: Gastroenterology;  Laterality: N/A;   CORONARY ANGIOPLASTY     dental implants     permanent   ENTEROSCOPY N/A 03/03/2022   Procedure: ENTEROSCOPY;  Surgeon: Otis Brace, MD;  [facility];  Service: Gastroenterology;  Laterality: N/A;   ESOPHAGEAL MANOMETRY N/A 11/07/2017   Procedure: ESOPHAGEAL MANOMETRY (EM);  Surgeon: Ronnette Juniper, MD;  [facility];  Service: Gastroenterology;  Laterality: N/A;   ESOPHAGOGASTRODUODENOSCOPY N/A 02/24/2022   Procedure: ESOPHAGOGASTRODUODENOSCOPY (EGD);  Surgeon: Arta Silence, MD;  [facility];  Service: Gastroenterology;  Laterality: N/A;   ESOPHAGOGASTRODUODENOSCOPY (EGD) WITH PROPOFOL N/A 01/22/2018   Procedure: ESOPHAGOGASTRODUODENOSCOPY (EGD) WITH PROPOFOL;  Surgeon: Ronnette Juniper, MD;  [facility];  Service: Gastroenterology;  Laterality: N/A;   ESOPHAGOGASTRODUODENOSCOPY (EGD) WITH PROPOFOL N/A 04/30/2019   Procedure: ESOPHAGOGASTRODUODENOSCOPY (EGD) WITH PROPOFOL;  Surgeon: Laurence Spates, MD;  Location: [place];  Service: Endoscopy;  Laterality: N/A;   ESOPHAGOGASTRODUODENOSCOPY (EGD) WITH PROPOFOL N/A 06/12/2019   Procedure: ESOPHAGOGASTRODUODENOSCOPY (EGD) WITH PROPOFOL;   Surgeon: Ronnette Juniper, MD;  [facility];  Service: Gastroenterology;  Laterality: N/A;  with botox injection   EXCISIONAL TOTAL SHOULDER ARTHROPLASTY WITH ANTIBIOTIC SPACER Right 12/31/2018   Procedure: EXCISIONAL TOTAL SHOULDER ARTHROPLASTY WITH ANTIBIOTIC SPACER;  Surgeon: Netta Cedars, MD;  Location: [place];  Service: Orthopedics;  Laterality: Right;   GIVENS CAPSULE STUDY N/A 03/01/2022   Procedure: GIVENS CAPSULE STUDY;  Surgeon: Otis Brace, MD;  [facility];  Service: Gastroenterology;  Laterality: N/A;   HEMOSTASIS CLIP PLACEMENT  03/03/2022   Procedure: HEMOSTASIS CLIP PLACEMENT;  Surgeon: Otis Brace, MD;  [facility];  Service: Gastroenterology;;   HERNIA REPAIR     HOT HEMOSTASIS N/A 03/03/2022   Procedure: HOT HEMOSTASIS (ARGON PLASMA COAGULATION/BICAP);  Surgeon: Otis Brace, MD;  [facility];  Service: Gastroenterology;  Laterality: N/A;  EGD and COLON   INTRAOPERATIVE TRANSTHORACIC ECHOCARDIOGRAM N/A 04/15/2019   Procedure: Intraoperative Transthoracic Echocardiogram;  Surgeon: Sherren Mocha, MD;  Location: [place];  Service: Open Heart Surgery;  Laterality: N/A;   IRRIGATION AND DEBRIDEMENT SHOULDER Right 11/20/2018    IRRIGATION AND DEBRIDEMENT SHOULDER WITH POLY EXCHANGE (Right Shoulder)   IRRIGATION AND DEBRIDEMENT SHOULDER Right 11/20/2018   Procedure: IRRIGATION AND DEBRIDEMENT SHOULDER WITH POLY EXCHANGE;  Surgeon: Netta Cedars, MD;  Location: [place];  Service: Orthopedics;  Laterality: Right;   LUMBAR LAMINECTOMY     x 2   PACEMAKER IMPLANT N/A 05/12/2022   Procedure: PACEMAKER IMPLANT;  Surgeon: Evans Lance, MD;  Location: [place] [facility];  Service: Cardiovascular;  Laterality: N/A;   POLYPECTOMY  03/03/2022   Procedure: POLYPECTOMY;  Surgeon: Otis Brace, MD;  [facility];  Service: Gastroenterology;;   REVERSE SHOULDER ARTHROPLASTY Right 03/01/2018   Procedure: RIGHT REVERSE SHOULDER  ARTHROPLASTY;  Surgeon: Netta Cedars, MD;  Location: [place];  Service: Orthopedics;  Laterality: Right;   REVERSE SHOULDER ARTHROPLASTY Right 07/18/2019   Procedure: REVERSE TOTAL SHOULDER ARTHROPLASTY and removal of antiobotic spacer;  Surgeon: Netta Cedars, MD;  [facility];  Service: Orthopedics;  Laterality: Right;  interscalene block   REVERSE SHOULDER ARTHROPLASTY Right 08/06/2019   Procedure: Reduction of dislocated reverse total shoulder and poly exchange;  Surgeon: Netta Cedars, MD;  [facility];  Service: Orthopedics;  Laterality: Right;  need 1 hour   RIGHT/LEFT HEART CATH AND CORONARY ANGIOGRAPHY N/A 04/02/2019   Procedure: RIGHT/LEFT HEART CATH AND CORONARY ANGIOGRAPHY;  Surgeon: Leonie Man, MD;  Location: [place] [facility];  Service: Cardiovascular;  Laterality: N/A;   SAVORY DILATION N/A 01/22/2018   Procedure: SAVORY DILATION;  Surgeon: Ronnette Juniper, MD;  [facility];  Service: Gastroenterology;  Laterality: N/A;  SAVORY DILATION N/A 04/30/2019   Procedure: SAVORY DILATION;  Surgeon: Laurence Spates, MD;  Location: [place];  Service: Endoscopy;  Laterality: N/A;  With fluro   SHOULDER HEMI-ARTHROPLASTY Right 06/14/2018   Procedure: RIGHT  REVERSE TOTAL SHOULDER OPEN POLY EXCHANGE;  Surgeon: Netta Cedars, MD;  Location: [place];  Service: Orthopedics;  Laterality: Right;   SUBMUCOSAL INJECTION  06/12/2019   Procedure: SUBMUCOSAL INJECTION;  Surgeon: Ronnette Juniper, MD;  [facility];  Service: Gastroenterology;;   TEE WITHOUT CARDIOVERSION N/A 01/29/2014   Procedure: TRANSESOPHAGEAL ECHOCARDIOGRAM (TEE);  Surgeon: Sueanne Margarita, MD;  [facility];  Service: Cardiovascular;  Laterality: N/A;   TEE WITHOUT CARDIOVERSION N/A 04/02/2019   Procedure: TRANSESOPHAGEAL ECHOCARDIOGRAM (TEE);  Surgeon: Josue Hector, MD;  Location: [facility];  Service: Cardiovascular;  Laterality: N/A;   TEE WITHOUT CARDIOVERSION N/A 09/25/2019   Procedure:  TRANSESOPHAGEAL ECHOCARDIOGRAM (TEE);  Surgeon: Jerline Pain, MD;  Location[facility];  Service: Cardiovascular;  Laterality: N/A;   TEE WITHOUT CARDIOVERSION N/A 03/08/2022   Procedure: TRANSESOPHAGEAL ECHOCARDIOGRAM (TEE);  Surgeon: [name], MD;  Location: [facility];  Service: Cardiovascular;  Laterality: N/A;   TONSILLECTOMY     TRANSCATHETER AORTIC VALVE REPLACEMENT, TRANSFEMORAL  04/15/2019   TRANSCATHETER AORTIC VALVE REPLACEMENT, TRANSFEMORAL N/A 04/15/2019   Procedure: TRANSCATHETER AORTIC VALVE REPLACEMENT, TRANSFEMORAL with POST BALLOON DILATION;  Surgeon: Sherren Mocha, MD;  Location: [place];  Service: Open Heart Surgery;  Laterality: N/A;     Current Meds  Medication Sig   apixaban (ELIQUIS) 2.5 MG TABS tablet Take 1 tablet (2.5 mg total) by mouth 2 (two) times daily.   cholestyramine (QUESTRAN) 4 g packet Take 4 g by mouth daily.   epoetin alfa-epbx (RETACRIT) 02542 UNIT/ML injection Inject 40,000 Units into the skin every 6 (six) weeks. As needed   finasteride (PROSCAR) 5 MG tablet Take 5 mg by mouth daily.   fluticasone (FLONASE) 50 MCG/ACT nasal spray Place 1 spray into both nostrils daily as needed for allergies or rhinitis.   folic acid (FOLVITE) 1 MG tablet Take 1 mg by mouth daily.   ketotifen (ZADITOR) 0.025 % ophthalmic solution Place 1 drop into both eyes 2 (two) times daily as needed (itchy eyes).   levothyroxine (SYNTHROID) 88 MCG tablet Take 88 mcg by mouth every morning.   mineral oil-hydrophilic petrolatum (AQUAPHOR) ointment Apply 1 Application topically as needed (wound care).   Multiple Vitamins-Minerals (MULTIVITAMIN WITH MINERALS) tablet Take 1 tablet by mouth daily.   potassium chloride SA (KLOR-CON M) 20 MEQ tablet Take 1 tablet (20 mEq total) by mouth every other day.   predniSONE (DELTASONE) 5 MG tablet Take 5 mg by mouth daily with breakfast. TAKING '15mg'$  QD FOR 3Wks   torsemide (DEMADEX) 20 MG tablet Take 1 tablet (20 mg total) by mouth  daily.     Allergies:   Patient has no known allergies.   Social History   Tobacco Use   Smoking status: Former    Packs/day: 0.50    Years: 10.00    Total pack years: 5.00    Types: Cigarettes    Start date: 01/16/1974    Quit date: 09/05/1983    Years since quitting: 38.8   Smokeless tobacco: Former    Types: Nurse, children's Use: Never used  Substance Use Topics   Alcohol use: Not Currently    Alcohol/week: 0.0 standard drinks of alcohol   Drug use: No     Family Hx: The  patient's family history includes Arthritis in his father; Heart Problems in his sister; Heart disease in his father; Other in his brother, daughter, mother, and sister; Suicidality in his brother.  ROS:   Please see the history of present illness.     All other systems reviewed and are negative.   Labs/Other Tests and Data Reviewed:    Recent Labs: 02/24/2022: TSH 0.691 02/28/2022: B Natriuretic Peptide 256.0 03/10/2022: Magnesium 2.0 07/05/2022: ALT 12; BUN 35; Creatinine 1.75; Hemoglobin 9.7; Platelet Count 98; Potassium 3.7; Sodium 140   Recent Lipid Panel Lab Results  Component Value Date/Time   CHOL  01/14/2008 04:50 AM    174        ATP III CLASSIFICATION:  <200     mg/dL   Desirable  200-239  mg/dL   Borderline High  >=240    mg/dL   High   TRIG 43 01/14/2008 04:50 AM   HDL 65 01/14/2008 04:50 AM   CHOLHDL 2.7 01/14/2008 04:50 AM   LDLCALC (H) 01/14/2008 04:50 AM    100        Total Cholesterol/HDL:CHD Risk Coronary Heart Disease Risk Table                     Men   Women  1/2 Average Risk   3.4   3.3    Wt Readings from Last 3 Encounters:  07/11/22 101 lb 6.4 oz (46 kg)  07/05/22 102 lb 12.8 oz (46.6 kg)  06/15/22 97 lb (44 kg)     Objective:    Vital Signs:  BP 138/82   Pulse 85   Ht '5\' 2"'$  (1.575 m)   Wt 101 lb 6.4 oz (46 kg)   SpO2 98%   BMI 18.55 kg/m    GEN: Well nourished, well developed in no acute distress HEENT: Normal NECK: No JVD; No carotid  bruits LYMPHATICS: No lymphadenopathy CARDIAC:RRR, no murmurs, rubs, gallops RESPIRATORY:  Clear to auscultation without rales, wheezing or rhonchi  ABDOMEN: Soft, non-tender, non-distended MUSCULOSKELETAL:  3+ pitting BLE edema; No deformity  SKIN: Warm and dry NEUROLOGIC:  Alert and oriented x 3 PSYCHIATRIC:  Normal affect  ASSESSMENT & PLAN:    1.  Aortic Insufficiency -severe bioprosthetic AI  -now s/p valve in valve TAVR in 04/2019 -Recent TEE in July 2023 in the setting of atrial fibrillation showed normal LV function with  stable valve in valve TAVR with normal function and trivial perivalvular leak   2.  Severe AS -s/p remote AVR for severe AS in 2009 with bioprosthetic 81m Edwards Science pericardial tissue AVR -see above - developed severe bioprosthetic AR and now s/p valve in valve TAVR -doing well, repeat echo 05/2019 showed normal LVF with stable TAVR with normal function with mean gradient 162mg and trivial periprosthetic AI. -TEE 03/2022 with trivial perivalvular leak -no ASA or Plavix due to DOAC   3.  Non obstructive ASCAD -nonobstructive by cath 2009 with 20-30% OM2 -nonobstructive by coronary CTA 2015 in all 3 vessels -he has chronic DOE due to diastolic CHF >> shortness of breath remains stable -He denies any recent anginal symptoms -cardiac cath at time of TAVR showed normal coronary arteries   4.  SOB -this is chronic and suspect multifactorial from anemia, CHF, PAF and possible ischemia -normal functioning TAVR by TEE 03/2022 -nonischemic DCM noted 09/2019 due to aflutter with RVR -EF 50 to 55% on TEE 03/2022 , recommend repeating 2D echo to make sure LVF is stable -since  he thinks his SOB has gotten worse   5.  LE edema -he has significant LE edema but does not tolerate compression hose and BP is too low for additional demadex -I think he gets too much added Na in his diet from him salting his food and eating out a lot>>we discussed this again  today -encouraged him to follow a < 2gm Na diet -Continue prescription drug man with Demadex 20 mg daily with as needed refills -he does not like thigh high compression hose -I have personally reviewed and interpreted outside labs performed by patient's PCP which showed serum creatinine 1.75 and potassium 3.7 07/05/2022   6.  Chronic  combined systolic/diastolic CHF/NICM -chronic SOB and LE edema likely multifactorial due to anemia, dietary indiscretion from Na, atrial flutter and anemia -EF on TEE 7/23 was 50-55% -lungs are clear and weight stable.  He has chronic LE edema -Continue Demadex 20 mg daily with as needed refills   7.  Paroxysmal atrial flutter -s/p aflutter ablation -reverted back to aflutter and started on Amio -He has had cardioversions in the past with most recent TEE/DCCV 03/08/2022 with EARF and then recurrent afib s/p DCCV in late July 23 -Unfortunately continued to feel poorly and now is status post AV node ablation with permanent pacemaker on 05/12/2022 -Seen back in EP clinic on 06/15/2022 and was back in A-fib and found to be pacer dependent and not conducting status post AV node ablation -Continue with drug management with Eliquis 2.5 mg twice daily dosed for age > 46 and weight < 60kg) with as needed refills -followed by EP  Jake wants to have her meeting neck Medication Adjustments/Labs and Tests Ordered: Current medicines are reviewed at length with the patient today.  Concerns regarding medicines are outlined above.  Tests Ordered: No orders of the defined types were placed in this encounter.  Medication Changes: No orders of the defined types were placed in this encounter.   Disposition:  Follow up 1 year  Signed, [name], MD  07/11/2022 9:33 AM    [place]

## 2022-07-11 NOTE — Patient Instructions (Signed)
Medication Instructions:  Your physician recommends that you continue on your current medications as directed. Please refer to the Current Medication list given to you today.  *If you need a refill on your cardiac medications before your next appointment, please call your pharmacy*   Lab Work: None ordered.  If you have labs (blood work) drawn today and your tests are completely normal, you will receive your results only by: [place] (if you have MyChart) OR A paper copy in the mail If you have any lab test that is abnormal or we need to change your treatment, we will call you to review the results.   Testing/Procedures: Your physician has requested that you have an echocardiogram. Echocardiography is a painless test that uses sound waves to create images of your heart. It provides your doctor with information about the size and shape of your heart and how well your heart's chambers and valves are working. This procedure takes approximately one hour. There are no restrictions for this procedure. Please do NOT wear cologne, perfume, aftershave, or lotions (deodorant is allowed). Please arrive 15 minutes prior to your appointment time.    Follow-Up: At [facility], you and your health needs are our priority.  As part of our continuing mission to provide you with exceptional heart care, we have created designated Provider Care Teams.  These Care Teams include your primary Cardiologist (physician) and Advanced Practice Providers (APPs -  Physician Assistants and Nurse Practitioners) who all work together to provide you with the care you need, when you need it.  We recommend signing up for the patient portal called "MyChart".  Sign up information is provided on this After Visit Summary.  MyChart is used to connect with patients for Virtual Visits (Telemedicine).  Patients are able to view lab/test results, encounter notes, upcoming appointments, etc.  Non-urgent messages can be  sent to your provider as well.   To learn more about what you can do with MyChart, go to [email or web address].    Your next appointment:   12 months with Dr Radford Pax  Important Information About [place]

## 2022-07-14 DIAGNOSIS — D508 Other iron deficiency anemias: Secondary | ICD-10-CM | POA: Diagnosis not present

## 2022-07-14 DIAGNOSIS — R197 Diarrhea, unspecified: Secondary | ICD-10-CM | POA: Diagnosis not present

## 2022-07-14 DIAGNOSIS — M810 Age-related osteoporosis without current pathological fracture: Secondary | ICD-10-CM | POA: Diagnosis not present

## 2022-07-14 DIAGNOSIS — M1991 Primary osteoarthritis, unspecified site: Secondary | ICD-10-CM | POA: Diagnosis not present

## 2022-07-14 DIAGNOSIS — Z681 Body mass index (BMI) 19 or less, adult: Secondary | ICD-10-CM | POA: Diagnosis not present

## 2022-07-14 DIAGNOSIS — Z79899 Other long term (current) drug therapy: Secondary | ICD-10-CM | POA: Diagnosis not present

## 2022-07-14 DIAGNOSIS — M0579 Rheumatoid arthritis with rheumatoid factor of multiple sites without organ or systems involvement: Secondary | ICD-10-CM | POA: Diagnosis not present

## 2022-07-24 ENCOUNTER — Other Ambulatory Visit: Payer: Self-pay

## 2022-07-24 ENCOUNTER — Telehealth: Payer: Self-pay | Admitting: Cardiology

## 2022-07-24 MED ORDER — APIXABAN 2.5 MG PO TABS: 2.5000 mg | ORAL_TABLET | Freq: Two times a day (BID) | ORAL | 1 refills | Status: DC

## 2022-07-24 NOTE — Telephone Encounter (Signed)
*  STAT* If patient is at the pharmacy, call can be transferred to refill team.   1. Which medications need to be refilled? (please list name of each medication and dose if known)   apixaban (ELIQUIS) 2.5 MG TABS tablet   2. Which pharmacy/location (including street and city if local pharmacy) is medication to be sent to?  [id number] - [place], [place] - [address]    3. Do they need a 30 day or 90 day supply? 90 day  Daughter stated the patient has been out of this medication since last Thursday, 11/16.

## 2022-07-24 NOTE — Telephone Encounter (Signed)
Prescription refill request for Eliquis received. Indication:afib Last office visit:11/23 Scr:1.7 Age: 87 Weight:46 kg  Prescription refilled

## 2022-07-26 ENCOUNTER — Other Ambulatory Visit: Payer: Self-pay | Admitting: Hematology and Oncology

## 2022-07-26 ENCOUNTER — Inpatient Hospital Stay: Payer: Medicare PPO

## 2022-07-26 DIAGNOSIS — D696 Thrombocytopenia, unspecified: Secondary | ICD-10-CM | POA: Diagnosis not present

## 2022-07-26 DIAGNOSIS — D638 Anemia in other chronic diseases classified elsewhere: Secondary | ICD-10-CM | POA: Diagnosis not present

## 2022-07-26 DIAGNOSIS — I4891 Unspecified atrial fibrillation: Secondary | ICD-10-CM | POA: Diagnosis not present

## 2022-07-26 DIAGNOSIS — D563 Thalassemia minor: Secondary | ICD-10-CM | POA: Diagnosis not present

## 2022-07-26 DIAGNOSIS — Z7901 Long term (current) use of anticoagulants: Secondary | ICD-10-CM | POA: Diagnosis not present

## 2022-07-26 LAB — CMP (CANCER CENTER ONLY)
ALT: 10 U/L (ref 0–44)
AST: 16 U/L (ref 15–41)
Albumin: 3.9 g/dL (ref 3.5–5.0)
Alkaline Phosphatase: 53 U/L (ref 38–126)
Anion gap: 8 (ref 5–15)
BUN: 37 mg/dL — ABNORMAL HIGH (ref 8–23)
CO2: 30 mmol/L (ref 22–32)
Calcium: 9 mg/dL (ref 8.9–10.3)
Chloride: 104 mmol/L (ref 98–111)
Creatinine: 1.8 mg/dL — ABNORMAL HIGH (ref 0.61–1.24)
GFR, Estimated: 36 mL/min — ABNORMAL LOW (ref >60)
Glucose, Bld: 97 mg/dL (ref 70–99)
Potassium: 3.7 mmol/L (ref 3.5–5.1)
Sodium: 142 mmol/L (ref 135–145)
Total Bilirubin: 0.5 mg/dL (ref 0.3–1.2)
Total Protein: 6 g/dL — ABNORMAL LOW (ref 6.5–8.1)

## 2022-07-26 LAB — CBC WITH DIFFERENTIAL (CANCER CENTER ONLY)
Abs Immature Granulocytes: 0.16 10*3/uL — ABNORMAL HIGH (ref 0.00–0.07)
Basophils Absolute: 0 10*3/uL (ref 0.0–0.1)
Basophils Relative: 0 %
Eosinophils Absolute: 0.2 10*3/uL (ref 0.0–0.5)
Eosinophils Relative: 2 %
HCT: 25.7 % — ABNORMAL LOW (ref 39.0–52.0)
Hemoglobin: 8.1 g/dL — ABNORMAL LOW (ref 13.0–17.0)
Immature Granulocytes: 2 %
Lymphocytes Relative: 7 %
Lymphs Abs: 0.7 10*3/uL (ref 0.7–4.0)
MCH: 22.9 pg — ABNORMAL LOW (ref 26.0–34.0)
MCHC: 31.5 g/dL (ref 30.0–36.0)
MCV: 72.6 fL — ABNORMAL LOW (ref 80.0–100.0)
Monocytes Absolute: 0.6 10*3/uL (ref 0.1–1.0)
Monocytes Relative: 6 %
Neutro Abs: 8.2 10*3/uL — ABNORMAL HIGH (ref 1.7–7.7)
Neutrophils Relative %: 83 %
Platelet Count: 178 10*3/uL (ref 150–400)
RBC: 3.54 MIL/uL — ABNORMAL LOW (ref 4.22–5.81)
RDW: 17.5 % — ABNORMAL HIGH (ref 11.5–15.5)
WBC Count: 9.9 10*3/uL (ref 4.0–10.5)
nRBC: 0.5 % — ABNORMAL HIGH (ref 0.0–0.2)

## 2022-07-26 LAB — IRON AND IRON BINDING CAPACITY (CC-WL,HP ONLY)
Iron: 87 ug/dL (ref 45–182)
Saturation Ratios: 36 % (ref 17.9–39.5)
TIBC: 244 ug/dL — ABNORMAL LOW (ref 250–450)
UIBC: 157 ug/dL (ref 117–376)

## 2022-07-26 LAB — SAMPLE TO BLOOD BANK

## 2022-07-26 LAB — FERRITIN: Ferritin: 165 ng/mL (ref 24–336)

## 2022-07-26 LAB — PREPARE RBC (CROSSMATCH)

## 2022-07-26 MED ORDER — SODIUM CHLORIDE 0.9% IV SOLUTION: 250.0000 mL | Freq: Once | INTRAVENOUS | Status: DC

## 2022-07-26 MED ORDER — HEPARIN SOD (PORK) LOCK FLUSH 100 UNIT/ML IV SOLN: 250.0000 [IU] | INTRAVENOUS | Status: DC | PRN

## 2022-07-26 MED ORDER — SODIUM CHLORIDE 0.9% FLUSH: 3.0000 mL | INTRAVENOUS | Status: DC | PRN

## 2022-07-26 NOTE — Patient Instructions (Signed)
Blood Transfusion, Adult, Care After The following information offers guidance on how to care for yourself after your procedure. Your health care provider may also give you more specific instructions. If you have problems or questions, contact your health care provider. What can I expect after the procedure? After the procedure, it is common to have: Bruising and soreness where the IV was inserted. A headache. Follow these instructions at home: IV insertion site care     Follow instructions from your health care provider about how to take care of your IV insertion site. Make sure you: Wash your hands with soap and water for at least 20 seconds before and after you change your bandage (dressing). If soap and water are not available, use hand sanitizer. Change your dressing as told by your health care provider. Check your IV insertion site every day for signs of infection. Check for: Redness, swelling, or pain. Bleeding from the site. Warmth. Pus or a bad smell. General instructions Take over-the-counter and prescription medicines only as told by your health care provider. Rest as told by your health care provider. Return to your normal activities as told by your health care provider. Keep all follow-up visits. Lab tests may need to be done at certain periods to recheck your blood counts. Contact a health care provider if: You have itching or red, swollen areas of skin (hives). You have a fever or chills. You have pain in the head, back, or chest. You feel anxious or you feel weak after doing your normal activities. You have redness, swelling, warmth, or pain around the IV insertion site. You have blood coming from the IV insertion site that does not stop with pressure. You have pus or a bad smell coming from your IV insertion site. If you received your blood transfusion in an outpatient setting, you will be told whom to contact to report any reactions. Get help right away if: You  have symptoms of a serious allergic or immune system reaction, including: Trouble breathing or shortness of breath. Swelling of the face, feeling flushed, or widespread rash. Dark urine or blood in the urine. Fast heartbeat. These symptoms may be an emergency. Get help right away. Call 911. Do not wait to see if the symptoms will go away. Do not drive yourself to the hospital. Summary Bruising and soreness around the IV insertion site are common. Check your IV insertion site every day for signs of infection. Rest as told by your health care provider. Return to your normal activities as told by your health care provider. Get help right away for symptoms of a serious allergic or immune system reaction to the blood transfusion. This information is not intended to replace advice given to you by your health care provider. Make sure you discuss any questions you have with your health care provider. Document Revised: 11/18/2021 Document Reviewed: 11/18/2021 Elsevier Patient Education  2023 [employer]  

## 2022-07-27 LAB — BPAM RBC
Blood Product Expiration Date: 202312142359
ISSUE DATE / TIME: 202311221009
Unit Type and Rh: 6200

## 2022-07-27 LAB — TYPE AND SCREEN
ABO/RH(D): A POS
Antibody Screen: NEGATIVE
Unit division: 0

## 2022-07-31 ENCOUNTER — Ambulatory Visit (HOSPITAL_COMMUNITY): Payer: Medicare PPO | Attending: Cardiology

## 2022-07-31 ENCOUNTER — Other Ambulatory Visit: Payer: Self-pay

## 2022-07-31 DIAGNOSIS — R0602 Shortness of breath: Secondary | ICD-10-CM | POA: Insufficient documentation

## 2022-07-31 DIAGNOSIS — I340 Nonrheumatic mitral (valve) insufficiency: Secondary | ICD-10-CM

## 2022-07-31 LAB — ECHOCARDIOGRAM COMPLETE
AR max vel: 1.71 cm2
AV Area VTI: 1.8 cm2
AV Area mean vel: 1.64 cm2
AV Mean grad: 7 mmHg
AV Peak grad: 14.3 mmHg
Ao pk vel: 1.89 m/s
Area-P 1/2: 6.71 cm2
MV M vel: 4.28 m/s
MV Peak grad: 73.3 mmHg
S' Lateral: 3.1 cm

## 2022-08-02 DIAGNOSIS — H353211 Exudative age-related macular degeneration, right eye, with active choroidal neovascularization: Secondary | ICD-10-CM | POA: Diagnosis not present

## 2022-08-14 ENCOUNTER — Inpatient Hospital Stay: Payer: Medicare PPO

## 2022-08-14 ENCOUNTER — Encounter: Payer: Medicare PPO | Admitting: Internal Medicine

## 2022-08-14 ENCOUNTER — Other Ambulatory Visit: Payer: Self-pay

## 2022-08-14 ENCOUNTER — Inpatient Hospital Stay: Payer: Medicare PPO | Attending: Hematology and Oncology | Admitting: Hematology and Oncology

## 2022-08-14 VITALS — BP 114/71 | HR 84 | Temp 97.8°F | Resp 18 | Ht 62.0 in | Wt 109.8 lb

## 2022-08-14 DIAGNOSIS — D563 Thalassemia minor: Secondary | ICD-10-CM | POA: Diagnosis not present

## 2022-08-14 DIAGNOSIS — I4891 Unspecified atrial fibrillation: Secondary | ICD-10-CM | POA: Diagnosis not present

## 2022-08-14 DIAGNOSIS — Z7901 Long term (current) use of anticoagulants: Secondary | ICD-10-CM | POA: Diagnosis not present

## 2022-08-14 DIAGNOSIS — D638 Anemia in other chronic diseases classified elsewhere: Secondary | ICD-10-CM | POA: Insufficient documentation

## 2022-08-14 DIAGNOSIS — D696 Thrombocytopenia, unspecified: Secondary | ICD-10-CM

## 2022-08-14 LAB — CBC WITH DIFFERENTIAL (CANCER CENTER ONLY)
Abs Immature Granulocytes: 0.12 10*3/uL — ABNORMAL HIGH (ref 0.00–0.07)
Basophils Absolute: 0 10*3/uL (ref 0.0–0.1)
Basophils Relative: 0 %
Eosinophils Absolute: 0.4 10*3/uL (ref 0.0–0.5)
Eosinophils Relative: 3 %
HCT: 23.8 % — ABNORMAL LOW (ref 39.0–52.0)
Hemoglobin: 7.4 g/dL — ABNORMAL LOW (ref 13.0–17.0)
Immature Granulocytes: 1 %
Lymphocytes Relative: 5 %
Lymphs Abs: 0.5 10*3/uL — ABNORMAL LOW (ref 0.7–4.0)
MCH: 23.9 pg — ABNORMAL LOW (ref 26.0–34.0)
MCHC: 31.1 g/dL (ref 30.0–36.0)
MCV: 76.8 fL — ABNORMAL LOW (ref 80.0–100.0)
Monocytes Absolute: 0.7 10*3/uL (ref 0.1–1.0)
Monocytes Relative: 6 %
Neutro Abs: 9.4 10*3/uL — ABNORMAL HIGH (ref 1.7–7.7)
Neutrophils Relative %: 85 %
Platelet Count: 186 10*3/uL (ref 150–400)
RBC: 3.1 MIL/uL — ABNORMAL LOW (ref 4.22–5.81)
RDW: 19.9 % — ABNORMAL HIGH (ref 11.5–15.5)
WBC Count: 11.2 10*3/uL — ABNORMAL HIGH (ref 4.0–10.5)
nRBC: 1 % — ABNORMAL HIGH (ref 0.0–0.2)

## 2022-08-14 LAB — SAMPLE TO BLOOD BANK

## 2022-08-14 LAB — CMP (CANCER CENTER ONLY)
ALT: 10 U/L (ref 0–44)
AST: 15 U/L (ref 15–41)
Albumin: 3.9 g/dL (ref 3.5–5.0)
Alkaline Phosphatase: 62 U/L (ref 38–126)
Anion gap: 9 (ref 5–15)
BUN: 34 mg/dL — ABNORMAL HIGH (ref 8–23)
CO2: 30 mmol/L (ref 22–32)
Calcium: 9.6 mg/dL (ref 8.9–10.3)
Chloride: 103 mmol/L (ref 98–111)
Creatinine: 1.84 mg/dL — ABNORMAL HIGH (ref 0.61–1.24)
GFR, Estimated: 35 mL/min — ABNORMAL LOW (ref >60)
Glucose, Bld: 104 mg/dL — ABNORMAL HIGH (ref 70–99)
Potassium: 3.6 mmol/L (ref 3.5–5.1)
Sodium: 142 mmol/L (ref 135–145)
Total Bilirubin: 0.6 mg/dL (ref 0.3–1.2)
Total Protein: 6.1 g/dL — ABNORMAL LOW (ref 6.5–8.1)

## 2022-08-14 LAB — IRON AND IRON BINDING CAPACITY (CC-WL,HP ONLY)
Iron: 76 ug/dL (ref 45–182)
Saturation Ratios: 28 % (ref 17.9–39.5)
TIBC: 274 ug/dL (ref 250–450)
UIBC: 198 ug/dL (ref 117–376)

## 2022-08-14 LAB — PREPARE RBC (CROSSMATCH)

## 2022-08-14 LAB — FERRITIN: Ferritin: 81 ng/mL (ref 24–336)

## 2022-08-14 MED ORDER — ACETAMINOPHEN 325 MG PO TABS
650.0000 mg | ORAL_TABLET | Freq: Once | ORAL | Status: AC
  Administered 2022-08-14: 650 mg via ORAL
  Filled 2022-08-14: qty 2

## 2022-08-14 MED ORDER — SODIUM CHLORIDE 0.9% IV SOLUTION
250.0000 mL | Freq: Once | INTRAVENOUS | Status: AC
  Administered 2022-08-14: 250 mL via INTRAVENOUS

## 2022-08-14 MED ORDER — DIPHENHYDRAMINE HCL 25 MG PO CAPS
25.0000 mg | ORAL_CAPSULE | Freq: Once | ORAL | Status: AC
  Administered 2022-08-14: 25 mg via ORAL
  Filled 2022-08-14: qty 1

## 2022-08-14 NOTE — Progress Notes (Signed)
Patient Care Team: Lavone Orn, MD as PCP - General (Internal Medicine) Sueanne Margarita, MD as PCP - Cardiology (Cardiology) Evans Lance, MD as PCP - Electrophysiology (Cardiology) Sueanne Margarita, MD as Consulting Physician (Cardiology) Nicholas Lose, MD as Consulting Physician (Hematology and Oncology)  DIAGNOSIS:  Encounter Diagnosis  Name Primary?   Anemia of chronic disease Yes    CHIEF COMPLIANT: Severe worsening anemia  INTERVAL HISTORY: Joshua Hudson is a 87 year old with above-mentioned history of chronic anemia request blood transfusions periodically.  He continues to suffer from profound fatigue to the point that he has significant limitations of activities of daily living.  He is still able to walk around without assistance.  But feels dizzy lightheaded and shortness of breath to exertion.  He also has a serious heart situation.  He has bilateral lower extremity edema which are bothering him.   ALLERGIES:  has No Known Allergies.  MEDICATIONS:  Current Outpatient Medications  Medication Sig Dispense Refill   apixaban (ELIQUIS) 2.5 MG TABS tablet Take 1 tablet (2.5 mg total) by mouth 2 (two) times daily. 180 tablet 1   cholestyramine (QUESTRAN) 4 g packet Take 4 g by mouth daily.     epoetin alfa-epbx (RETACRIT) 78469 UNIT/ML injection Inject 40,000 Units into the skin every 6 (six) weeks. As needed     finasteride (PROSCAR) 5 MG tablet Take 5 mg by mouth daily.     fluticasone (FLONASE) 50 MCG/ACT nasal spray Place 1 spray into both nostrils daily as needed for allergies or rhinitis.     folic acid (FOLVITE) 1 MG tablet Take 1 mg by mouth daily.     ketotifen (ZADITOR) 0.025 % ophthalmic solution Place 1 drop into both eyes 2 (two) times daily as needed (itchy eyes).     levothyroxine (SYNTHROID) 88 MCG tablet Take 88 mcg by mouth every morning.     mineral oil-hydrophilic petrolatum (AQUAPHOR) ointment Apply 1 Application topically as needed (wound care).      Multiple Vitamins-Minerals (MULTIVITAMIN WITH MINERALS) tablet Take 1 tablet by mouth daily.     pantoprazole (PROTONIX) 40 MG tablet Take 1 tablet (40 mg total) by mouth 2 (two) times daily before a meal for 30 days, THEN 1 tablet (40 mg total) daily. 120 tablet 0   potassium chloride SA (KLOR-CON M) 20 MEQ tablet Take 1 tablet (20 mEq total) by mouth every other day. 45 tablet 1   predniSONE (DELTASONE) 5 MG tablet Take 5 mg by mouth daily with breakfast. TAKING '15mg'$  QD FOR 3Wks     torsemide (DEMADEX) 20 MG tablet Take 1 tablet (20 mg total) by mouth daily. 30 tablet 0   No current facility-administered medications for this visit.    PHYSICAL EXAMINATION: ECOG PERFORMANCE STATUS: 1 - Symptomatic but completely ambulatory  Vitals:   08/14/22 0757  BP: 114/71  Pulse: 84  Resp: 18  Temp: 97.8 F (36.6 C)  SpO2: 100%   Filed Weights   08/14/22 0757  Weight: 109 lb 12.8 oz (49.8 kg)      LABORATORY DATA:  I have reviewed the data as listed    Latest Ref Rng & Units 07/26/2022    7:34 AM 07/05/2022    7:56 AM 06/14/2022    8:10 AM  CMP  Glucose 70 - 99 mg/dL 97  83  115   BUN 8 - 23 mg/dL 37  35  36   Creatinine 0.61 - 1.24 mg/dL 1.80  1.75  1.66  Sodium 135 - 145 mmol/L 142  140  140   Potassium 3.5 - 5.1 mmol/L 3.7  3.7  3.6   Chloride 98 - 111 mmol/L 104  107  106   CO2 22 - 32 mmol/L '30  26  27   '$ Calcium 8.9 - 10.3 mg/dL 9.0  8.2  7.9   Total Protein 6.5 - 8.1 g/dL 6.0  5.8  5.8   Total Bilirubin 0.3 - 1.2 mg/dL 0.5  0.5  0.4   Alkaline Phos 38 - 126 U/L 53  51  47   AST 15 - 41 U/L '16  19  21   '$ ALT 0 - 44 U/L '10  12  26     '$ Lab Results  Component Value Date   WBC 11.2 (H) 08/14/2022   HGB 7.4 (L) 08/14/2022   HCT 23.8 (L) 08/14/2022   MCV 76.8 (L) 08/14/2022   PLT 186 08/14/2022   NEUTROABS 9.4 (H) 08/14/2022    ASSESSMENT & PLAN:  Anemia of chronic disease Hemoglobin electrophoresis: Beta thalassemia minor.  This is the cause of microcytosis.  It is  not iron deficiency related.   Possible differential: Anemia of chronic disease versus myelodysplastic syndrome versus methotrexate related toxicity.     Hospitalization 09/22/2019-09/25/2019: A. fib with RVR status post cardioversion, currently on Eliquis Hospitalization 02/28/2022-03/11/2022: Atrial fibrillation: Cardioversion planned for 03/22/2022. 05/12/22: Ablation and Pacemaker Implantation    Lab review: 09/25/2019: WBC 7.2, hemoglobin 9.6, platelets 145 10/03/19: WBC: 7.5, Hb 8.9, Pl: 135 04/18/2021: Hemoglobin 8.7 (Retacrit), blood transfusion on 04/04/2021 for hemoglobin of 7.7  05/30/21: Hemoglobin 6.7, blood transfusion, platelets 85 (will need to be watched) 08/23/2021: Hemoglobin 9.6 (Retacrit) 10/04/2021: Hemoglobin 8, platelets 151 (2 units of PRBC) 12/05/2021: Hemoglobin 8.7 12/26/2021: Hemoglobin 7.1, platelets 118 03/20/2022: Hemoglobin 7.1, platelets 127 (2 units of PRBC) 04/14/22: Hb 7.8, Platelets 91 (PRBC) 05/03/22: Hemoglobin 8.8 (because he feels so weak we are transfusing 1 unit of PRBC)  06/14/22: Hb 10.4, Pl 151, Cr 1.66, Iron sat: 16%, Ferritin 287 07/05/22: Hemoglobin 9.7 08/14/22: Hemoglobin 7.4  I recommended 2 units of PRBC today. Lower extremity swelling and pain: We will obtain ultrasound of the lower extremity.  I do not think it will be a blood clot since he is currently on Eliquis.  He has an appoint with cardiology this week.  Return to clinic as previously scheduled for labs and blood transfusions.   No orders of the defined types were placed in this encounter.  The patient has a good understanding of the overall plan. he agrees with it. he will call with any problems that may develop before the next visit here. Total time spent: 30 mins including face to face time and time spent for planning, charting and co-ordination of care   [name], MD 08/14/22

## 2022-08-14 NOTE — Patient Instructions (Signed)
Blood Transfusion, Adult, Care After The following information offers guidance on how to care for yourself after your procedure. Your health care provider may also give you more specific instructions. If you have problems or questions, contact your health care provider. What can I expect after the procedure? After the procedure, it is common to have: Bruising and soreness where the IV was inserted. A headache. Follow these instructions at home: IV insertion site care     Follow instructions from your health care provider about how to take care of your IV insertion site. Make sure you: Wash your hands with soap and water for at least 20 seconds before and after you change your bandage (dressing). If soap and water are not available, use hand sanitizer. Change your dressing as told by your health care provider. Check your IV insertion site every day for signs of infection. Check for: Redness, swelling, or pain. Bleeding from the site. Warmth. Pus or a bad smell. General instructions Take over-the-counter and prescription medicines only as told by your health care provider. Rest as told by your health care provider. Return to your normal activities as told by your health care provider. Keep all follow-up visits. Lab tests may need to be done at certain periods to recheck your blood counts. Contact a health care provider if: You have itching or red, swollen areas of skin (hives). You have a fever or chills. You have pain in the head, back, or chest. You feel anxious or you feel weak after doing your normal activities. You have redness, swelling, warmth, or pain around the IV insertion site. You have blood coming from the IV insertion site that does not stop with pressure. You have pus or a bad smell coming from your IV insertion site. If you received your blood transfusion in an outpatient setting, you will be told whom to contact to report any reactions. Get help right away if: You  have symptoms of a serious allergic or immune system reaction, including: Trouble breathing or shortness of breath. Swelling of the face, feeling flushed, or widespread rash. Dark urine or blood in the urine. Fast heartbeat. These symptoms may be an emergency. Get help right away. Call 911. Do not wait to see if the symptoms will go away. Do not drive yourself to the hospital. Summary Bruising and soreness around the IV insertion site are common. Check your IV insertion site every day for signs of infection. Rest as told by your health care provider. Return to your normal activities as told by your health care provider. Get help right away for symptoms of a serious allergic or immune system reaction to the blood transfusion. This information is not intended to replace advice given to you by your health care provider. Make sure you discuss any questions you have with your health care provider. Document Revised: 11/18/2021 Document Reviewed: 11/18/2021 Elsevier Patient Education  2023 [employer]  

## 2022-08-14 NOTE — Assessment & Plan Note (Signed)
Hemoglobin electrophoresis: Beta thalassemia minor.  This is the cause of microcytosis.  It is not iron deficiency related.   Possible differential: Anemia of chronic disease versus myelodysplastic syndrome versus methotrexate related toxicity.     Hospitalization 09/22/2019-09/25/2019: A. fib with RVR status post cardioversion, currently on Eliquis Hospitalization 02/28/2022-03/11/2022: Atrial fibrillation: Cardioversion planned for 03/22/2022. 05/12/22: Ablation and Pacemaker Implantation    Lab review: 09/25/2019: WBC 7.2, hemoglobin 9.6, platelets 145 10/03/19: WBC: 7.5, Hb 8.9, Pl: 135 04/18/2021: Hemoglobin 8.7 (Retacrit), blood transfusion on 04/04/2021 for hemoglobin of 7.7  05/30/21: Hemoglobin 6.7, blood transfusion, platelets 85 (will need to be watched) 08/23/2021: Hemoglobin 9.6 (Retacrit) 10/04/2021: Hemoglobin 8, platelets 151 (2 units of PRBC) 12/05/2021: Hemoglobin 8.7 12/26/2021: Hemoglobin 7.1, platelets 118 03/20/2022: Hemoglobin 7.1, platelets 127 (2 units of PRBC) 04/14/22: Hb 7.8, Platelets 91 (PRBC) 05/03/22: Hemoglobin 8.8 (because he feels so weak we are transfusing 1 unit of PRBC)  06/14/22: Hb 10.4, Pl 151, Cr 1.66, Iron sat: 16%, Ferritin 287 07/05/22: Hemoglobin 9.7 08/14/22:

## 2022-08-14 NOTE — Progress Notes (Signed)
Transfuse 2 units PRBC per MD

## 2022-08-15 ENCOUNTER — Ambulatory Visit (HOSPITAL_COMMUNITY)
Admission: RE | Admit: 2022-08-15 | Discharge: 2022-08-15 | Disposition: A | Discharge status: Home / Self Care | Payer: Medicare PPO | Source: Ambulatory Visit | Attending: Hematology and Oncology | Admitting: Hematology and Oncology

## 2022-08-15 DIAGNOSIS — D649 Anemia, unspecified: Secondary | ICD-10-CM | POA: Diagnosis not present

## 2022-08-15 DIAGNOSIS — D638 Anemia in other chronic diseases classified elsewhere: Secondary | ICD-10-CM | POA: Diagnosis not present

## 2022-08-15 DIAGNOSIS — M7989 Other specified soft tissue disorders: Secondary | ICD-10-CM | POA: Diagnosis not present

## 2022-08-15 LAB — TYPE AND SCREEN
ABO/RH(D): A POS
Antibody Screen: NEGATIVE
Unit division: 0
Unit division: 0

## 2022-08-15 LAB — BPAM RBC
Blood Product Expiration Date: 202312212359
Blood Product Expiration Date: 202312302359
ISSUE DATE / TIME: 202312111011
ISSUE DATE / TIME: 202312111011
Unit Type and Rh: 6200
Unit Type and Rh: 6200

## 2022-08-17 ENCOUNTER — Ambulatory Visit: Payer: Medicare PPO | Attending: Internal Medicine | Admitting: Internal Medicine

## 2022-08-17 ENCOUNTER — Encounter: Payer: Self-pay | Admitting: Internal Medicine

## 2022-08-17 VITALS — BP 120/74 | HR 66 | Ht 62.0 in | Wt 113.2 lb

## 2022-08-17 DIAGNOSIS — I5021 Acute systolic (congestive) heart failure: Secondary | ICD-10-CM

## 2022-08-17 DIAGNOSIS — I4892 Unspecified atrial flutter: Secondary | ICD-10-CM

## 2022-08-17 DIAGNOSIS — I10 Essential (primary) hypertension: Secondary | ICD-10-CM

## 2022-08-17 NOTE — Patient Instructions (Addendum)
Medication Instructions: TAKE TORSEMIDE 20 MG 2 IN THE AM AND 1 AFTER LUNCH BRING MEDICINES WITH YOU AT NEXT APPOINTMENT *If you need a refill on your cardiac medications before your next appointment, please call your pharmacy*   Lab Work: NONE If you have labs (blood work) drawn today and your tests are completely normal, you will receive your results only by: [place] (if you have MyChart) OR A paper copy in the mail If you have any lab test that is abnormal or we need to change your treatment, we will call you to review the results.   Testing/Procedures: NONE   Follow-Up: At [facility], you and your health needs are our priority.  As part of our continuing mission to provide you with exceptional heart care, we have created designated Provider Care Teams.  These Care Teams include your primary Cardiologist (physician) and Advanced Practice Providers (APPs -  Physician Assistants and Nurse Practitioners) who all work together to provide you with the care you need, when you need it.  We recommend signing up for the patient portal called "MyChart".  Sign up information is provided on this After Visit Summary.  MyChart is used to connect with patients for Virtual Visits (Telemedicine).  Patients are able to view lab/test results, encounter notes, upcoming appointments, etc.  Non-urgent messages can be sent to your provider as well.   To learn more about what you can do with MyChart, go to [email or web address].    Your next appointment:   3 -4 month(s)  The format for your next appointment:   In Person  Provider:  DR Lovena Le  Other Instructions NONE  Important Information About Sugar

## 2022-08-17 NOTE — Progress Notes (Signed)
HPI Joshua Hudson returns today for followup. He is a pleasant40 yo man with uncontrolled atrial fib and sinus node dysfunction and diastolic heart failure who underwent AV node ablation and PPM insertion about 3 months ago. He has had problems with peripheral edema. Denies chest pain or sob. No syncope. He admits to sodium indiscretion.  No Known Allergies   Current Outpatient Medications  Medication Sig Dispense Refill   apixaban (ELIQUIS) 2.5 MG TABS tablet Take 1 tablet (2.5 mg total) by mouth 2 (two) times daily. 180 tablet 1   cholestyramine (QUESTRAN) 4 g packet Take 4 g by mouth daily.     epoetin alfa-epbx (RETACRIT) 45625 UNIT/ML injection Inject 40,000 Units into the skin every 6 (six) weeks. As needed     finasteride (PROSCAR) 5 MG tablet Take 5 mg by mouth daily.     fluticasone (FLONASE) 50 MCG/ACT nasal spray Place 1 spray into both nostrils daily as needed for allergies or rhinitis.     folic acid (FOLVITE) 1 MG tablet Take 1 mg by mouth daily.     furosemide (LASIX) 40 MG tablet Take 80 mg by mouth daily.     ketotifen (ZADITOR) 0.025 % ophthalmic solution Place 1 drop into both eyes 2 (two) times daily as needed (itchy eyes).     levothyroxine (SYNTHROID) 88 MCG tablet Take 88 mcg by mouth every morning.     mineral oil-hydrophilic petrolatum (AQUAPHOR) ointment Apply 1 Application topically as needed (wound care).     Multiple Vitamins-Minerals (MULTIVITAMIN WITH MINERALS) tablet Take 1 tablet by mouth daily.     potassium chloride SA (KLOR-CON M) 20 MEQ tablet Take 1 tablet (20 mEq total) by mouth every other day. 45 tablet 1   predniSONE (DELTASONE) 5 MG tablet Take 5 mg by mouth daily with breakfast. TAKING '15mg'$  QD FOR 3Wks     pantoprazole (PROTONIX) 40 MG tablet Take 1 tablet (40 mg total) by mouth 2 (two) times daily before a meal for 30 days, THEN 1 tablet (40 mg total) daily. 120 tablet 0   torsemide (DEMADEX) 20 MG tablet Take 1 tablet (20 mg total) by mouth  daily. (Patient not taking: Reported on 08/17/2022) 30 tablet 0   No current facility-administered medications for this visit.     Past Medical History:  Diagnosis Date   Achalasia 06/12/2019   Noted on EGD   Aortic insufficiency 03/24/2019   AI of bioprosthetic AVR severe 4 plus   Chronic diastolic CHF (congestive heart failure) ([place]) 03/24/2019   Colon polyps    s/p diverticular perforation requiring 2-stage repair   Derangement of right shoulder joint    need replacing has no use of   Diverticulosis    DJD (degenerative joint disease), lumbosacral    Dysphagia    eats soft food   Esophageal stricture    GERD (gastroesophageal reflux disease)    Hemolytic anemia ([place])    History of blood transfusion given with 04-15-19 surgery   History of blood transfusion 07/18/2019   History of kidney stones    passed stones   Hypertension    Mitral regurgitation    moderate   Occipital neuralgia    Pancytopenia (HCC)    Postoperative atrial fibrillation ([place]) 05/10/2015   Prosthetic valve dysfunction    Rheumatoid arthritis (HCC)    s/o long term steroids  shoulders and hands   Rotator cuff arthropathy    right   S/P aortic valve replacement with bioprosthetic valve  01/16/2008   60m Edwards Magna perimount bovine pericardial tissue valve, model 3000   S/P valve-in-valve TAVR 04/15/2019   26 mm Edwards Sapien 3 Ultra transcatheter heart valve placed via percutaneous right transfemoral approach    SBE (subacute bacterial endocarditis) prophylaxis candidate    for dental procedures   Schatzki's ring 06/12/2019   Narrowing, Noted on EGD   Severe aortic stenosis    S/P prosthetic valve replacement w 25 mm Edwards like science percardial tissue valve,[place] - 01/2008   Thrombocytopenia (HCC)     ROS:   All systems reviewed and negative except as noted in the HPI.   Past Surgical History:  Procedure Laterality Date   A-FLUTTER ABLATION N/A 10/10/2019   Procedure: A-FLUTTER  ABLATION;  Surgeon: TEvans Lance MD;  [facility];  Service: Cardiovascular;  Laterality: N/A;   APPENDECTOMY     AV NODE ABLATION N/A 05/12/2022   Procedure: AV NODE ABLATION;  Surgeon: TEvans Lance MD;  [facility];  Service: Cardiovascular;  Laterality: N/A;   BALLOON DILATION N/A 06/12/2019   Procedure: BALLOON DILATION;  Surgeon: KRonnette Juniper MD;  [facility];  Service: Gastroenterology;  Laterality: N/A;   BOTOX INJECTION N/A 01/22/2018   Procedure: BOTOX INJECTION;  Surgeon: KRonnette Juniper MD;  [facility];  Service: Gastroenterology;  Laterality: N/A;   CARDIAC CATHETERIZATION     09   CARDIAC VALVE REPLACEMENT  01/2008   aortic valve replacement   CARDIOVERSION N/A 09/25/2019   Procedure: CARDIOVERSION;  Surgeon: SJerline Pain MD;  [facility]  Service: Cardiovascular;  Laterality: N/A;   CARDIOVERSION N/A 12/25/2019   Procedure: CARDIOVERSION;  Surgeon: CLelon Perla MD;  [facility]  Service: Cardiovascular;  Laterality: N/A;   CARDIOVERSION N/A 03/08/2022   Procedure: CARDIOVERSION;  Surgeon: PFreada Bergeron MD;  [facility];  Service: Cardiovascular;  Laterality: N/A;   CARDIOVERSION N/A 03/28/2022   Procedure: CARDIOVERSION;  Surgeon: TBerniece Salines DO;  [facility]  Service: Cardiovascular;  Laterality: N/A;   CATARACT EXTRACTION W/ INTRAOCULAR LENS  IMPLANT, BILATERAL     COLON RESECTION     diverticulitis    COLONOSCOPY N/A 03/03/2022   Procedure: COLONOSCOPY;  Surgeon: BOtis Brace MD;  [facility];  Service: Gastroenterology;  Laterality: N/A;   CORONARY ANGIOPLASTY     dental implants     permanent   ENTEROSCOPY N/A 03/03/2022   Procedure: ENTEROSCOPY;  Surgeon: BOtis Brace MD;  [facility];  Service: Gastroenterology;  Laterality: N/A;   ESOPHAGEAL MANOMETRY N/A 11/07/2017   Procedure: ESOPHAGEAL MANOMETRY (EM);  Surgeon:  KRonnette Juniper MD;  [facility];  Service: Gastroenterology;  Laterality: N/A;   ESOPHAGOGASTRODUODENOSCOPY N/A 02/24/2022   Procedure: ESOPHAGOGASTRODUODENOSCOPY (EGD);  Surgeon: OArta Silence MD;  [facility];  Service: Gastroenterology;  Laterality: N/A;   ESOPHAGOGASTRODUODENOSCOPY (EGD) WITH PROPOFOL N/A 01/22/2018   Procedure: ESOPHAGOGASTRODUODENOSCOPY (EGD) WITH PROPOFOL;  Surgeon: KRonnette Juniper MD;  [facility];  Service: Gastroenterology;  Laterality: N/A;   ESOPHAGOGASTRODUODENOSCOPY (EGD) WITH PROPOFOL N/A 04/30/2019   Procedure: ESOPHAGOGASTRODUODENOSCOPY (EGD) WITH PROPOFOL;  Surgeon: ELaurence Spates MD;  [facility]  Service: Endoscopy;  Laterality: N/A;   ESOPHAGOGASTRODUODENOSCOPY (EGD) WITH PROPOFOL N/A 06/12/2019   Procedure: ESOPHAGOGASTRODUODENOSCOPY (EGD) WITH PROPOFOL;  Surgeon: KRonnette Juniper MD;  [facility];  Service: Gastroenterology;  Laterality: N/A;  with botox injection   EXCISIONAL TOTAL SHOULDER ARTHROPLASTY WITH ANTIBIOTIC SPACER Right 12/31/2018   Procedure:  EXCISIONAL TOTAL SHOULDER ARTHROPLASTY WITH ANTIBIOTIC SPACER;  Surgeon: Netta Cedars, MD;  Location: [place];  Service: Orthopedics;  Laterality: Right;   GIVENS CAPSULE STUDY N/A 03/01/2022   Procedure: GIVENS CAPSULE STUDY;  Surgeon: Otis Brace, MD;  [facility];  Service: Gastroenterology;  Laterality: N/A;   HEMOSTASIS CLIP PLACEMENT  03/03/2022   Procedure: HEMOSTASIS CLIP PLACEMENT;  Surgeon: Otis Brace, MD;  [facility];  Service: Gastroenterology;;   HERNIA REPAIR     HOT HEMOSTASIS N/A 03/03/2022   Procedure: HOT HEMOSTASIS (ARGON PLASMA COAGULATION/BICAP);  Surgeon: Otis Brace, MD;  [facility];  Service: Gastroenterology;  Laterality: N/A;  EGD and COLON   INTRAOPERATIVE TRANSTHORACIC ECHOCARDIOGRAM N/A 04/15/2019   Procedure: Intraoperative Transthoracic Echocardiogram;  Surgeon: Sherren Mocha, MD;   Location: [place];  Service: Open Heart Surgery;  Laterality: N/A;   IRRIGATION AND DEBRIDEMENT SHOULDER Right 11/20/2018    IRRIGATION AND DEBRIDEMENT SHOULDER WITH POLY EXCHANGE (Right Shoulder)   IRRIGATION AND DEBRIDEMENT SHOULDER Right 11/20/2018   Procedure: IRRIGATION AND DEBRIDEMENT SHOULDER WITH POLY EXCHANGE;  Surgeon: Netta Cedars, MD;  Location: [place];  Service: Orthopedics;  Laterality: Right;   LUMBAR LAMINECTOMY     x 2   PACEMAKER IMPLANT N/A 05/12/2022   Procedure: PACEMAKER IMPLANT;  Surgeon: Evans Lance, MD;  Location: [place] [facility];  Service: Cardiovascular;  Laterality: N/A;   POLYPECTOMY  03/03/2022   Procedure: POLYPECTOMY;  Surgeon: Otis Brace, MD;  [facility];  Service: Gastroenterology;;   REVERSE SHOULDER ARTHROPLASTY Right 03/01/2018   Procedure: RIGHT REVERSE SHOULDER ARTHROPLASTY;  Surgeon: Netta Cedars, MD;  Location: [place];  Service: Orthopedics;  Laterality: Right;   REVERSE SHOULDER ARTHROPLASTY Right 07/18/2019   Procedure: REVERSE TOTAL SHOULDER ARTHROPLASTY and removal of antiobotic spacer;  Surgeon: Netta Cedars, MD;  [facility];  Service: Orthopedics;  Laterality: Right;  interscalene block   REVERSE SHOULDER ARTHROPLASTY Right 08/06/2019   Procedure: Reduction of dislocated reverse total shoulder and poly exchange;  Surgeon: Netta Cedars, MD;  [facility];  Service: Orthopedics;  Laterality: Right;  need 1 hour   RIGHT/LEFT HEART CATH AND CORONARY ANGIOGRAPHY N/A 04/02/2019   Procedure: RIGHT/LEFT HEART CATH AND CORONARY ANGIOGRAPHY;  Surgeon: Leonie Man, MD;  Location: [place] [facility];  Service: Cardiovascular;  Laterality: N/A;   SAVORY DILATION N/A 01/22/2018   Procedure: SAVORY DILATION;  Surgeon: Ronnette Juniper, MD;  [facility];  Service: Gastroenterology;  Laterality: N/A;   SAVORY DILATION N/A 04/30/2019   Procedure: SAVORY DILATION;  Surgeon: Laurence Spates, MD;  Location: [place];  Service:  Endoscopy;  Laterality: N/A;  With fluro   SHOULDER HEMI-ARTHROPLASTY Right 06/14/2018   Procedure: RIGHT  REVERSE TOTAL SHOULDER OPEN POLY EXCHANGE;  Surgeon: Netta Cedars, MD;  Location: [place];  Service: Orthopedics;  Laterality: Right;   SUBMUCOSAL INJECTION  06/12/2019   Procedure: SUBMUCOSAL INJECTION;  Surgeon: Ronnette Juniper, MD;  [facility];  Service: Gastroenterology;;   TEE WITHOUT CARDIOVERSION N/A 01/29/2014   Procedure: TRANSESOPHAGEAL ECHOCARDIOGRAM (TEE);  Surgeon: Sueanne Margarita, MD;  Location[facility];  Service: Cardiovascular;  Laterality: N/A;   TEE WITHOUT CARDIOVERSION N/A 04/02/2019   Procedure: TRANSESOPHAGEAL ECHOCARDIOGRAM (TEE);  Surgeon: Josue Hector, MD;  Location: [facility];  Service: Cardiovascular;  Laterality: N/A;   TEE WITHOUT CARDIOVERSION N/A 09/25/2019   Procedure: TRANSESOPHAGEAL ECHOCARDIOGRAM (TEE);  Surgeon: Jerline Pain, MD;  Location: [facility];  Service: Cardiovascular;  Laterality: N/A;   TEE  WITHOUT CARDIOVERSION N/A 03/08/2022   Procedure: TRANSESOPHAGEAL ECHOCARDIOGRAM (TEE);  Surgeon: [name], MD;  Location: [facility] ENDOSCOPY;  Service: Cardiovascular;  Laterality: N/A;   TONSILLECTOMY     TRANSCATHETER AORTIC VALVE REPLACEMENT, TRANSFEMORAL  04/15/2019   TRANSCATHETER AORTIC VALVE REPLACEMENT, TRANSFEMORAL N/A 04/15/2019   Procedure: TRANSCATHETER AORTIC VALVE REPLACEMENT, TRANSFEMORAL with POST BALLOON DILATION;  Surgeon: Sherren Mocha, MD;  Location: [place];  Service: Open Heart Surgery;  Laterality: N/A;     Family History  Problem Relation Age of Onset   Other Mother        NO HEALTH PROBLEMS   Heart disease Father    Arthritis Father    Heart Problems Sister        RELATED TO A MVA   Suicidality Brother    Other Sister        3 SISTERS IN GOOD HEALTH   Other Brother        2 BROTHERS IN GOOD HEALTH   Other Daughter        2 IN GOOD HEALTH     Social History   Socioeconomic History   Marital  status: Widowed    Spouse name: Not on file   Number of children: 2   Years of education: Not on file   Highest education level: Not on file  Occupational History   Occupation: Retired Market researcher at [employer]   Smoking status: Former    Packs/day: 0.50    Years: 10.00    Total pack years: 5.00    Types: Cigarettes    Start date: 01/16/1974    Quit date: 09/05/1983    Years since quitting: 38.9   Smokeless tobacco: Former    Types: Nurse, children's Use: Never used  Substance and Sexual Activity   Alcohol use: Not Currently    Alcohol/week: 0.0 standard drinks of alcohol   Drug use: No   Sexual activity: Not on file  Other Topics Concern   Not on file  Social History Narrative   Not on file   Social Determinants of Health   Financial Resource Strain: Low Risk  (06/30/2022)   Overall Financial Resource Strain (CARDIA)    Difficulty of Paying Living Expenses: Not hard at all  Food Insecurity: No Food Insecurity (06/30/2022)   Hunger Vital Sign    Worried About Running Out of Food in the Last Year: Never true    [place] in the Last Year: Never true  Transportation Needs: No Transportation Needs (06/30/2022)   PRAPARE - Hydrologist (Medical): No    Lack of Transportation (Non-Medical): No  Physical Activity: Not on file  Stress: Not on file  Social Connections: Not on file  Intimate Partner Violence: Not on file     BP 120/74   Pulse 66   Ht '5\' 2"'$  (1.575 m)   Wt 113 lb 3.2 oz (51.3 kg)   SpO2 100%   BMI 20.70 kg/m   Physical Exam:  Well appearing NAD HEENT: Unremarkable Neck:  No JVD, no thyromegally Lymphatics:  No adenopathy Back:  No CVA tenderness Lungs:  Clear HEART:  Regular rate rhythm, no murmurs, no rubs, no clicks Abd:  soft, positive bowel sounds, no organomegally, no rebound, no guarding Ext:  2 plus pulses, 3+ on left and 2+ on right edema, no cyanosis, no clubbing Skin:  No rashes no  nodules Neuro:  CN II through XII intact,  motor grossly intact  EKG - atrial fib with ventricular pacing  DEVICE  Normal device function.  See PaceArt for details.   Assess/Plan: Uncontrolled atrial fib and flutter - his rates are now controlled after AV node ablation. Chronic diastolic heart failure R>L. I asked him to increase the torsemide to 3 pills a day and return in a week for a BMP. I asked him to keep his legs elevated, and to raise up the foot of his bed.  PPM -his [employer] DDD  PM is working normally.  HTN - his bp is controlled.  Carleene Overlie Boleslaw Borghi,MD

## 2022-08-21 ENCOUNTER — Emergency Department (HOSPITAL_COMMUNITY)
Admission: EM | Admit: 2022-08-21 | Discharge: 2022-08-22 | Disposition: A | Discharge status: Home / Self Care | Payer: Medicare PPO | Attending: Emergency Medicine | Admitting: Emergency Medicine

## 2022-08-21 ENCOUNTER — Other Ambulatory Visit: Payer: Self-pay

## 2022-08-21 ENCOUNTER — Encounter (HOSPITAL_COMMUNITY): Payer: Self-pay

## 2022-08-21 DIAGNOSIS — I5033 Acute on chronic diastolic (congestive) heart failure: Secondary | ICD-10-CM | POA: Diagnosis not present

## 2022-08-21 DIAGNOSIS — I110 Hypertensive heart disease with heart failure: Secondary | ICD-10-CM | POA: Insufficient documentation

## 2022-08-21 DIAGNOSIS — Z7901 Long term (current) use of anticoagulants: Secondary | ICD-10-CM

## 2022-08-21 DIAGNOSIS — N289 Disorder of kidney and ureter, unspecified: Secondary | ICD-10-CM

## 2022-08-21 DIAGNOSIS — Z79899 Other long term (current) drug therapy: Secondary | ICD-10-CM | POA: Insufficient documentation

## 2022-08-21 DIAGNOSIS — D72829 Elevated white blood cell count, unspecified: Secondary | ICD-10-CM | POA: Diagnosis not present

## 2022-08-21 DIAGNOSIS — Z950 Presence of cardiac pacemaker: Secondary | ICD-10-CM | POA: Diagnosis not present

## 2022-08-21 DIAGNOSIS — D649 Anemia, unspecified: Secondary | ICD-10-CM | POA: Diagnosis not present

## 2022-08-21 DIAGNOSIS — M7989 Other specified soft tissue disorders: Secondary | ICD-10-CM | POA: Diagnosis present

## 2022-08-21 LAB — CBC WITH DIFFERENTIAL/PLATELET
Abs Immature Granulocytes: 0.07 10*3/uL (ref 0.00–0.07)
Basophils Absolute: 0 10*3/uL (ref 0.0–0.1)
Basophils Relative: 0 %
Eosinophils Absolute: 0.5 10*3/uL (ref 0.0–0.5)
Eosinophils Relative: 4 %
HCT: 29.8 % — ABNORMAL LOW (ref 39.0–52.0)
Hemoglobin: 9 g/dL — ABNORMAL LOW (ref 13.0–17.0)
Immature Granulocytes: 1 %
Lymphocytes Relative: 10 %
Lymphs Abs: 1.2 10*3/uL (ref 0.7–4.0)
MCH: 25.2 pg — ABNORMAL LOW (ref 26.0–34.0)
MCHC: 30.2 g/dL (ref 30.0–36.0)
MCV: 83.5 fL (ref 80.0–100.0)
Monocytes Absolute: 1.2 10*3/uL — ABNORMAL HIGH (ref 0.1–1.0)
Monocytes Relative: 11 %
Neutro Abs: 8.5 10*3/uL — ABNORMAL HIGH (ref 1.7–7.7)
Neutrophils Relative %: 74 %
Platelets: 179 10*3/uL (ref 150–400)
RBC: 3.57 MIL/uL — ABNORMAL LOW (ref 4.22–5.81)
RDW: 19.4 % — ABNORMAL HIGH (ref 11.5–15.5)
WBC: 11.4 10*3/uL — ABNORMAL HIGH (ref 4.0–10.5)
nRBC: 0.4 % — ABNORMAL HIGH (ref 0.0–0.2)

## 2022-08-21 LAB — COMPREHENSIVE METABOLIC PANEL
ALT: 12 U/L (ref 0–44)
AST: 17 U/L (ref 15–41)
Albumin: 3.4 g/dL — ABNORMAL LOW (ref 3.5–5.0)
Alkaline Phosphatase: 45 U/L (ref 38–126)
Anion gap: 8 (ref 5–15)
BUN: 31 mg/dL — ABNORMAL HIGH (ref 8–23)
CO2: 30 mmol/L (ref 22–32)
Calcium: 9.5 mg/dL (ref 8.9–10.3)
Chloride: 100 mmol/L (ref 98–111)
Creatinine, Ser: 1.76 mg/dL — ABNORMAL HIGH (ref 0.61–1.24)
GFR, Estimated: 37 mL/min — ABNORMAL LOW (ref >60)
Glucose, Bld: 110 mg/dL — ABNORMAL HIGH (ref 70–99)
Potassium: 4.7 mmol/L (ref 3.5–5.1)
Sodium: 138 mmol/L (ref 135–145)
Total Bilirubin: 0.6 mg/dL (ref 0.3–1.2)
Total Protein: 6.1 g/dL — ABNORMAL LOW (ref 6.5–8.1)

## 2022-08-21 LAB — LACTIC ACID, PLASMA: Lactic Acid, Venous: 1.4 mmol/L (ref 0.5–1.9)

## 2022-08-21 MED ORDER — FUROSEMIDE 10 MG/ML IJ SOLN
80.0000 mg | Freq: Once | INTRAMUSCULAR | Status: AC
  Administered 2022-08-22: 80 mg via INTRAVENOUS
  Filled 2022-08-21: qty 8

## 2022-08-21 MED ORDER — OXYCODONE-ACETAMINOPHEN 5-325 MG PO TABS
2.0000 | ORAL_TABLET | Freq: Once | ORAL | Status: AC
  Administered 2022-08-21: 2 via ORAL
  Filled 2022-08-21: qty 2

## 2022-08-21 NOTE — ED Provider Notes (Signed)
[place] DEPT Provider Note   CSN: [id number] Arrival date & time: 08/21/22  1602     History {Add pertinent medical, surgical, social history, OB history to HPI:1} Chief Complaint  Patient presents with   Leg Pain    Joshua Hudson is a 87 y.o. male.  The history is provided by the patient.  Leg Pain He has history of hypertension, rheumatoid arthritis, aortic valve replacement, persistent atrial fibrillation anticoagulated on apixaban, electronic pacemaker, diastolic heart failure and comes in because of painful swelling of both legs, left more than right.  He had seen his hematologist who was worried that he might have a blood clot and sent him for an ultrasound which showed no evidence of blood clots.  He saw his cardiologist who advised him to increase his torsemide which is now 40 mg in the morning and 20 mg in the afternoon.  He has not noted any improvement with any of this.  He has also tried keeping his leg elevated.  He denies chest pain, heaviness, tightness, pressure.  He denies any shortness of breath.  He denies fever or chills.   Home Medications Prior to Admission medications   Medication Sig Start Date End Date Taking? Authorizing Provider  apixaban (ELIQUIS) 2.5 MG TABS tablet Take 1 tablet (2.5 mg total) by mouth 2 (two) times daily. 07/24/22   Evans Lance, MD  cholestyramine Lucrezia Starch) 4 g packet Take 4 g by mouth daily. 05/02/22   [provider]  epoetin alfa-epbx (RETACRIT) 62703 UNIT/ML injection Inject 40,000 Units into the skin every 6 (six) weeks. As needed    [provider]  finasteride (PROSCAR) 5 MG tablet Take 5 mg by mouth daily. 06/26/19   [provider]  fluticasone (FLONASE) 50 MCG/ACT nasal spray Place 1 spray into both nostrils daily as needed for allergies or rhinitis.    [provider]  folic acid (FOLVITE) 1 MG tablet Take 1 mg by mouth daily. 05/19/22   [provider]  ketotifen (ZADITOR) 0.025 % ophthalmic solution Place 1 drop into both eyes 2 (two) times daily as needed (itchy eyes).    [provider]  levothyroxine (SYNTHROID) 88 MCG tablet Take 88 mcg by mouth every morning. 02/06/22   [provider]  mineral oil-hydrophilic petrolatum (AQUAPHOR) ointment Apply 1 Application topically as needed (wound care).    [provider]  Multiple Vitamins-Minerals (MULTIVITAMIN WITH MINERALS) tablet Take 1 tablet by mouth daily.    [provider]  pantoprazole (PROTONIX) 40 MG tablet Take 1 tablet (40 mg total) by mouth 2 (two) times daily before a meal for 30 days, THEN 1 tablet (40 mg total) daily. 02/25/22 05/26/22  Mercy Riding, MD  potassium chloride SA (KLOR-CON M) 20 MEQ tablet Take 1 tablet (20 mEq total) by mouth every other day. 05/25/22   Baldwin Jamaica, PA-C  predniSONE (DELTASONE) 5 MG tablet Take 5 mg by mouth daily with breakfast. TAKING '15mg'$  QD FOR 3Wks    [provider]  torsemide (DEMADEX) 20 MG tablet Take 40 mg by mouth daily.    [provider]      Allergies    Patient has no known allergies.    Review of Systems   Review of Systems  All other systems reviewed and are negative.   Physical Exam Updated Vital Signs BP 128/70 (BP Location: Right Arm)   Pulse 60   Temp 97.6 F (36.4 C) (Oral)  Resp 16   Ht '5\' 2"'$  (1.575 m)   Wt 51.3 kg   SpO2 100%   BMI 20.70 kg/m  Physical Exam Vitals and nursing note reviewed.   87 year old male, resting comfortably and in no acute distress. Vital signs are normal. Oxygen saturation is 100%, which is normal. Head is normocephalic and atraumatic. PERRLA, EOMI. Oropharynx is clear. Neck is nontender and supple without adenopathy or JVD. Back is nontender and there is no CVA tenderness. Lungs are clear without rales, wheezes, or rhonchi. Chest is nontender. Heart has regular rate and rhythm without murmur. Abdomen is soft,  flat, nontender. Extremities have 2-3+ pretibial edema on the right, 3+ pretibial edema on the left.  There are moderate venous stasis changes present. Skin is warm and dry without rash. Neurologic: Mental status is normal, cranial nerves are intact, moves all extremities equally.  ED Results / Procedures / Treatments   Labs (all labs ordered are listed, but only abnormal results are displayed) Labs Reviewed  COMPREHENSIVE METABOLIC PANEL - Abnormal; Notable for the following components:      Result Value   Glucose, Bld 110 (*)    BUN 31 (*)    Creatinine, Ser 1.76 (*)    Total Protein 6.1 (*)    Albumin 3.4 (*)    GFR, Estimated 37 (*)    All other components within normal limits  CBC WITH DIFFERENTIAL/PLATELET - Abnormal; Notable for the following components:   WBC 11.4 (*)    RBC 3.57 (*)    Hemoglobin 9.0 (*)    HCT 29.8 (*)    MCH 25.2 (*)    RDW 19.4 (*)    nRBC 0.4 (*)    Neutro Abs 8.5 (*)    Monocytes Absolute 1.2 (*)    All other components within normal limits  LACTIC ACID, PLASMA  LACTIC ACID, PLASMA   Procedures Procedures  {Document cardiac monitor, telemetry assessment procedure when appropriate:1}  Medications Ordered in ED Medications  oxyCODONE-acetaminophen (PERCOCET/ROXICET) 5-325 MG per tablet 2 tablet (2 tablets Oral Given 08/21/22 1819)    ED Course/ Medical Decision Making/ A&P                           Medical Decision Making  Peripheral edema which appears to be mainly from heart failure with some component of venous stasis.  I have reviewed his old records, showing cardiology office visit on 08/17/2022 with dilatation of asymmetric peripheral edema and increase in dose of torsemide.  Also, office visit with oncology on 08/14/2022 with notation of lower extremity swelling and pain and venous ultrasound ordered.  Venous Doppler on 08/15/2022 showed no evidence of DVT, Baker's cyst and did note significant edema.  I have reviewed and interpreted  his laboratory results, and my interpretation is stable renal insufficiency, stable mild leukocytosis, anemia which is improved compared with 08/14/2022, but he did receive 2 units of blood since then.  Echocardiogram on 07/31/2022 showed ejection fraction of 50-55% with grade 2 diastolic dysfunction.  I have ordered a dose of intravenous furosemide and he will be kept in the emergency department to observe clinical response.  {Document critical care time when appropriate:1} {Document review of labs and clinical decision tools ie heart score, Chads2Vasc2 etc:1}  {Document your independent review of radiology images, and any outside records:1} {Document your discussion with family members, caretakers, and with consultants:1} {Document social determinants of health affecting pt's care:1} {Document your decision making  why or why not admission, treatments were needed:1} Final Clinical Impression(s) / ED Diagnoses Final diagnoses:  None    Rx / DC Orders ED Discharge Orders     None

## 2022-08-21 NOTE — ED Triage Notes (Signed)
Patient c/o left lower leg pain and swelling x 1 week. Patient reports that he had an US done on his left leg 4 days ago and it was negative.  Patient states that the swelling and pain is worse. Patient states that he has been keeping his leg elevated and not eating salt. Patient also reports that his Torsemide was increased to tid.

## 2022-08-21 NOTE — ED Provider Triage Note (Signed)
Emergency Medicine Provider Triage Evaluation Note  TAEGAN STANDAGE , a 87 y.o. male  was evaluated in triage.  Pt complains of left lower leg pain and swelling for the past week.  He reports that he had an ultrasound done on his leg 4 days ago and it was negative for clot.  He states the swelling and pain is worse despite him keeping his leg elevated and lowering his sodium intake.  He reports his torsemide was also increased to 3 times a day.  Review of Systems  Positive: As above Negative: As above  Physical Exam  BP 103/63 (BP Location: Right Arm)   Pulse 60   Temp 98.5 F (36.9 C) (Oral)   Resp 16   Ht '5\' 2"'$  (1.575 m)   Wt 51.3 kg   SpO2 98%   BMI 20.70 kg/m  Gen:   Awake, no distress   Resp:  Normal effort  MSK:   Moves extremities without difficulty  Other:  Left leg erythematous with nonpitting edema, when compared to right is doubled in size, increased warmth and tenderness to palpation  Medical Decision Making  Medically screening exam initiated at 5:57 PM.  Appropriate orders placed.  QUARON DELACRUZ was informed that the remainder of the evaluation will be completed by another provider, this initial triage assessment does not replace that evaluation, and the importance of remaining in the ED until their evaluation is complete.     Theressa Stamps R, [place] 08/21/22 [phone]

## 2022-08-22 MED ORDER — FUROSEMIDE 10 MG/ML IJ SOLN
40.0000 mg | Freq: Once | INTRAMUSCULAR | Status: AC
  Administered 2022-08-22: 40 mg via INTRAVENOUS
  Filled 2022-08-22: qty 4

## 2022-08-22 MED ORDER — OXYCODONE HCL 5 MG PO TABS: 5.0000 mg | ORAL_TABLET | ORAL | 0 refills | Status: DC | PRN

## 2022-08-22 NOTE — Discharge Instructions (Addendum)
Increase your torsemide to 4 tablets (80 mg) every morning, 2 tablets (40 mg) every afternoon for the next 3 days.  After that, go back to 2 tablets (40 mg) every morning and 1 tablet (20 mg) every afternoon.  Weigh yourself every day.  You will need to work with your cardiologist to establish what your dry weight is.  Once that is established, you can use weight gain or weight loss to guide your dose of torsemide.  Return if you have any new or concerning symptoms.

## 2022-08-23 NOTE — Progress Notes (Unsigned)
Cardiology Office Note:    Date:  08/24/2022   ID:  [name], DOB Jun 21, 1935, MRN [id number]  PCP:  Joshua Blacker, MD   [facility] Providers Cardiologist:  Joshua Him, MD Electrophysiologist:  Joshua Peru, MD     Referring MD: Joshua Blacker, MD   Chief Complaint: leg edema  History of Present Illness:    Joshua Hudson is a pleasant 87 y.o. male with a hx of severe AS s/p AVR with a 26 mm Edwards bioprosthetic valve in 2009 with postop A-fib, bioprosthetic valve failure with severe AI s/p valve in valve TAVR 04/15/2019, HTN, rheumatoid arthritis, moderate MR, and chronic HFpEF.  He underwent aortic valve replacement using a 25 mm Edwards magna stented bovine pericardial tissue valve by Dr. Servando Hudson in 2009 for severe symptomatic aortic stenosis.  His early postop recovery was notable for atrial fibrillation.  Seen by Joshua Barrios, PA in the office on 03/18/2019 for preoperative clearance for redo shoulder arthroplasty.  He was noted to have significant lower extremity edema as well as other signs and symptoms of acute heart failure.  Follow-up TTE on 03/18/2019 revealed normal left ventricular systolic function with at least moderate AI.  Subsequent TEE on 04/02/2019 confirmed the presence of severe prosthetic valve dysfunction with severe AI.  There was no vegetation on the aortic valve to suggest endocarditis.  Cardiac catheterization on 04/02/2019 showed normal coronaries.  He was noted to have worsening anemia.  He underwent successful valve in valve TAVR with a 26 mm Edwards SAPIEN ultra THV via the TF approach on 04/15/2019.  Postop echo showed EF 60 to 65%, normal functioning TAVR with mean gradient of 14 mmHg and no AI.  Noted to be in a flutter with RVR on 09/22/2019 when he presented for esophageal dilatation.  He has been having fatigue, DOE, and Hudson edema.  He was started on Eliquis and underwent successful TEE/DCCV to NSR.  2D echo showed EF 45 to 50% felt  to be tachycardia mediated.  He was seen by EP and underwent a flutter ablation.  He was back in a flutter at [facility] on 11/11/2019 and started on amio.  He underwent DCCV ED in [place] on 12/25/2019.  Had GIB in June 2023 and started on PPI and Eliquis was held for 1 week.  He went back into atrial fibrillation with RVR on 03/10/22 and underwent TEE/DCCV to NSR.  His EF was 50 to 55% at that time.  He then went back into A-fib and underwent DCCV on 03/28/2022.  He felt poorly since that time.  On 05/12/2022 he underwent AVN ablation with PPM with Joshua Hudson.   Seen in clinic by Joshua Hudson on 07/11/2022 at which time he continued to report DOE. Has chronic Hudson edema. Repeat echocardiogram 07/31/2022 revealed low normal LVEF 50 to 55%, G2 DD, stable TAVR and mild MR, no change since study in 2021. Was encouraged to reduce salt intake and use leg elevation and compression. Advised to return in 1 year for follow-up.  Seen by Joshua Hudson on 08/17/22.  EKG revealed atrial fibs with ventricular pacing with well controlled rate, device dependent with normal device function. Advised to return in 3-4 months for follow-up.   ED visit 08/21/22 for painful swelling in both legs, left more than right.  Was seen by hematologist who ordered DVT ultrasound which showed no evidence of clots.  Torsemide was increased to 40 mg in the morning and 20 mg in the afternoon  with no improvement in swelling.  He had moderate urine output following 2 IV doses of furosemide.  He was given the option of hospital admission for more aggressive diuresis but opted to go home. Was instructed to increase torsemide to 80 mg in the morning and 40 mg in the afternoon for 3 days then reduce back to current dose.  Today, he is here with his wife for ED follow-up. Reports no improvement in leg edema.  Wife reports very minor improvement following IV diuretic at hospital. Has always had some Hudson swelling but worse since early December. Got 2 units PRBC 12/11  and is  scheduled to return 1/224. He will complete 3 days of increased torsemide 80 mg am and 40  mg pm today. Very little improvement. Cannot tolerate compression. Says that he keeps them elevated with toes parallel to nose at home. No dyspnea, orthopnea, PND, chest pain, presyncope, syncope. No evidence of volume overload elsewhere.   Past Medical History:  Diagnosis Date   Achalasia 06/12/2019   Noted on EGD   Aortic insufficiency 03/24/2019   AI of bioprosthetic AVR severe 4 plus   Chronic diastolic CHF (congestive heart failure) (Joshua Hudson) 03/24/2019   Colon polyps    s/p diverticular perforation requiring 2-stage repair   Derangement of right shoulder joint    need replacing has no use of   Diverticulosis    DJD (degenerative joint disease), lumbosacral    Dysphagia    eats soft food   Esophageal stricture    GERD (gastroesophageal reflux disease)    Hemolytic anemia (Joshua Hudson)    History of blood transfusion given with 04-15-19 surgery   History of blood transfusion 07/18/2019   History of kidney stones    passed stones   Hypertension    Mitral regurgitation    moderate   Occipital neuralgia    Pancytopenia (HCC)    Postoperative atrial fibrillation (Joshua Hudson) 05/10/2015   Prosthetic valve dysfunction    Rheumatoid arthritis (HCC)    s/o long term steroids  shoulders and hands   Rotator cuff arthropathy    right   S/P aortic valve replacement with bioprosthetic valve 01/16/2008   76m Edwards Magna perimount bovine pericardial tissue valve, model 3000   S/P valve-in-valve TAVR 04/15/2019   26 mm Edwards Sapien 3 Ultra transcatheter heart valve placed via percutaneous right transfemoral approach    SBE (subacute bacterial endocarditis) prophylaxis candidate    for dental procedures   Schatzki's ring 06/12/2019   Narrowing, Noted on EGD   Severe aortic stenosis    S/P prosthetic valve replacement w 25 mm Edwards like science percardial tissue valve,Joshua Hudson - 01/2008   Thrombocytopenia ([facility]      Past Surgical History:  Procedure Laterality Date   A-FLUTTER ABLATION N/A 10/10/2019   Procedure: A-FLUTTER ABLATION;  Surgeon: TEvans Lance MD;  [facility];  Service: Cardiovascular;  Laterality: N/A;   APPENDECTOMY     AV NODE ABLATION N/A 05/12/2022   Procedure: AV NODE ABLATION;  Surgeon: TEvans Lance MD;  [facility];  Service: Cardiovascular;  Laterality: N/A;   BALLOON DILATION N/A 06/12/2019   Procedure: BALLOON DILATION;  Surgeon: KRonnette Juniper MD;  [facility];  Service: Gastroenterology;  Laterality: N/A;   BOTOX INJECTION N/A 01/22/2018   Procedure: BOTOX INJECTION;  Surgeon: KRonnette Juniper MD;  [facility];  Service: Gastroenterology;  Laterality: N/A;   CARDIAC CATHETERIZATION     09   CARDIAC VALVE  REPLACEMENT  01/2008   aortic valve replacement   CARDIOVERSION N/A 09/25/2019   Procedure: CARDIOVERSION;  Surgeon: Jerline Pain, MD;  Location: [place];  Service: Cardiovascular;  Laterality: N/A;   CARDIOVERSION N/A 12/25/2019   Procedure: CARDIOVERSION;  Surgeon: Lelon Perla, MD;  Location: [place];  Service: Cardiovascular;  Laterality: N/A;   CARDIOVERSION N/A 03/08/2022   Procedure: CARDIOVERSION;  Surgeon: [name], MD;  Location: [facility];  Service: Cardiovascular;  Laterality: N/A;   CARDIOVERSION N/A 03/28/2022   Procedure: CARDIOVERSION;  Surgeon: Berniece Salines, DO;  Location: [place];  Service: Cardiovascular;  Laterality: N/A;   CATARACT EXTRACTION W/ INTRAOCULAR LENS  IMPLANT, BILATERAL     COLON RESECTION     diverticulitis    COLONOSCOPY N/A 03/03/2022   Procedure: COLONOSCOPY;  Surgeon: Otis Brace, MD;  [facility];  Service: Gastroenterology;  Laterality: N/A;   CORONARY ANGIOPLASTY     dental implants     permanent   ENTEROSCOPY N/A 03/03/2022   Procedure: ENTEROSCOPY;  Surgeon: Otis Brace, MD;  [facility];  Service:  Gastroenterology;  Laterality: N/A;   ESOPHAGEAL MANOMETRY N/A 11/07/2017   Procedure: ESOPHAGEAL MANOMETRY (EM);  Surgeon: Ronnette Juniper, MD;  [facility];  Service: Gastroenterology;  Laterality: N/A;   ESOPHAGOGASTRODUODENOSCOPY N/A 02/24/2022   Procedure: ESOPHAGOGASTRODUODENOSCOPY (EGD);  Surgeon: Arta Silence, MD;  [facility];  Service: Gastroenterology;  Laterality: N/A;   ESOPHAGOGASTRODUODENOSCOPY (EGD) WITH PROPOFOL N/A 01/22/2018   Procedure: ESOPHAGOGASTRODUODENOSCOPY (EGD) WITH PROPOFOL;  Surgeon: Ronnette Juniper, MD;  [facility];  Service: Gastroenterology;  Laterality: N/A;   ESOPHAGOGASTRODUODENOSCOPY (EGD) WITH PROPOFOL N/A 04/30/2019   Procedure: ESOPHAGOGASTRODUODENOSCOPY (EGD) WITH PROPOFOL;  Surgeon: Laurence Spates, MD;  Location: [place];  Service: Endoscopy;  Laterality: N/A;   ESOPHAGOGASTRODUODENOSCOPY (EGD) WITH PROPOFOL N/A 06/12/2019   Procedure: ESOPHAGOGASTRODUODENOSCOPY (EGD) WITH PROPOFOL;  Surgeon: Ronnette Juniper, MD;  [facility];  Service: Gastroenterology;  Laterality: N/A;  with botox injection   EXCISIONAL TOTAL SHOULDER ARTHROPLASTY WITH ANTIBIOTIC SPACER Right 12/31/2018   Procedure: EXCISIONAL TOTAL SHOULDER ARTHROPLASTY WITH ANTIBIOTIC SPACER;  Surgeon: Netta Cedars, MD;  Location: [place];  Service: Orthopedics;  Laterality: Right;   GIVENS CAPSULE STUDY N/A 03/01/2022   Procedure: GIVENS CAPSULE STUDY;  Surgeon: Otis Brace, MD;  [facility];  Service: Gastroenterology;  Laterality: N/A;   HEMOSTASIS CLIP PLACEMENT  03/03/2022   Procedure: HEMOSTASIS CLIP PLACEMENT;  Surgeon: Otis Brace, MD;  [facility];  Service: Gastroenterology;;   HERNIA REPAIR     HOT HEMOSTASIS N/A 03/03/2022   Procedure: HOT HEMOSTASIS (ARGON PLASMA COAGULATION/BICAP);  Surgeon: Otis Brace, MD;  Location: [facility];  Service: Gastroenterology;  Laterality: N/A;  EGD and COLON   INTRAOPERATIVE  TRANSTHORACIC ECHOCARDIOGRAM N/A 04/15/2019   Procedure: Intraoperative Transthoracic Echocardiogram;  Surgeon: Sherren Mocha, MD;  Location: [place];  Service: Open Heart Surgery;  Laterality: N/A;   IRRIGATION AND DEBRIDEMENT SHOULDER Right 11/20/2018    IRRIGATION AND DEBRIDEMENT SHOULDER WITH POLY EXCHANGE (Right Shoulder)   IRRIGATION AND DEBRIDEMENT SHOULDER Right 11/20/2018   Procedure: IRRIGATION AND DEBRIDEMENT SHOULDER WITH POLY EXCHANGE;  Surgeon: Netta Cedars, MD;  Location: [place];  Service: Orthopedics;  Laterality: Right;   LUMBAR LAMINECTOMY     x 2   PACEMAKER IMPLANT N/A 05/12/2022   Procedure: PACEMAKER IMPLANT;  Surgeon: Evans Lance, MD;  Location: [place] [facility];  Service: Cardiovascular;  Laterality: N/A;   POLYPECTOMY  03/03/2022   Procedure: POLYPECTOMY;  Surgeon: Otis Brace, MD;  Location[facility];  Service: Gastroenterology;;   REVERSE SHOULDER ARTHROPLASTY Right 03/01/2018   Procedure: RIGHT REVERSE SHOULDER ARTHROPLASTY;  Surgeon: Netta Cedars, MD;  Location: [place];  Service: Orthopedics;  Laterality: Right;   REVERSE SHOULDER ARTHROPLASTY Right 07/18/2019   Procedure: REVERSE TOTAL SHOULDER ARTHROPLASTY and removal of antiobotic spacer;  Surgeon: Netta Cedars, MD;  [facility];  Service: Orthopedics;  Laterality: Right;  interscalene block   REVERSE SHOULDER ARTHROPLASTY Right 08/06/2019   Procedure: Reduction of dislocated reverse total shoulder and poly exchange;  Surgeon: Netta Cedars, MD;  [facility];  Service: Orthopedics;  Laterality: Right;  need 1 hour   RIGHT/LEFT HEART CATH AND CORONARY ANGIOGRAPHY N/A 04/02/2019   Procedure: RIGHT/LEFT HEART CATH AND CORONARY ANGIOGRAPHY;  Surgeon: Leonie Man, MD;  Location: [place] [facility];  Service: Cardiovascular;  Laterality: N/A;   SAVORY DILATION N/A 01/22/2018   Procedure: SAVORY DILATION;  Surgeon: Ronnette Juniper, MD;  [facility];  Service: Gastroenterology;  Laterality:  N/A;   SAVORY DILATION N/A 04/30/2019   Procedure: SAVORY DILATION;  Surgeon: Laurence Spates, MD;  Location: [place];  Service: Endoscopy;  Laterality: N/A;  With fluro   SHOULDER HEMI-ARTHROPLASTY Right 06/14/2018   Procedure: RIGHT  REVERSE TOTAL SHOULDER OPEN POLY EXCHANGE;  Surgeon: Netta Cedars, MD;  Location: [place];  Service: Orthopedics;  Laterality: Right;   SUBMUCOSAL INJECTION  06/12/2019   Procedure: SUBMUCOSAL INJECTION;  Surgeon: Ronnette Juniper, MD;  [facility];  Service: Gastroenterology;;   TEE WITHOUT CARDIOVERSION N/A 01/29/2014   Procedure: TRANSESOPHAGEAL ECHOCARDIOGRAM (TEE);  Surgeon: Sueanne Margarita, MD;  Location: [facility];  Service: Cardiovascular;  Laterality: N/A;   TEE WITHOUT CARDIOVERSION N/A 04/02/2019   Procedure: TRANSESOPHAGEAL ECHOCARDIOGRAM (TEE);  Surgeon: Josue Hector, MD;  Location: [facility] ENDOSCOPY;  Service: Cardiovascular;  Laterality: N/A;   TEE WITHOUT CARDIOVERSION N/A 09/25/2019   Procedure: TRANSESOPHAGEAL ECHOCARDIOGRAM (TEE);  Surgeon: Jerline Pain, MD;  Location: [facility];  Service: Cardiovascular;  Laterality: N/A;   TEE WITHOUT CARDIOVERSION N/A 03/08/2022   Procedure: TRANSESOPHAGEAL ECHOCARDIOGRAM (TEE);  Surgeon: [name], MD;  Location: [facility];  Service: Cardiovascular;  Laterality: N/A;   TONSILLECTOMY     TRANSCATHETER AORTIC VALVE REPLACEMENT, TRANSFEMORAL  04/15/2019   TRANSCATHETER AORTIC VALVE REPLACEMENT, TRANSFEMORAL N/A 04/15/2019   Procedure: TRANSCATHETER AORTIC VALVE REPLACEMENT, TRANSFEMORAL with POST BALLOON DILATION;  Surgeon: Sherren Mocha, MD;  Location: [place];  Service: Open Heart Surgery;  Laterality: N/A;    Current Medications: Current Meds  Medication Sig   apixaban (ELIQUIS) 2.5 MG TABS tablet Take 1 tablet (2.5 mg total) by mouth 2 (two) times daily.   cholestyramine (QUESTRAN) 4 g packet Take 4 g by mouth daily.   empagliflozin (JARDIANCE) 10 MG TABS tablet Take 1 tablet (10 mg  total) by mouth daily.   epoetin alfa-epbx (RETACRIT) 39532 UNIT/ML injection Inject 40,000 Units into the skin every 6 (six) weeks. As needed   finasteride (PROSCAR) 5 MG tablet Take 5 mg by mouth daily.   fluticasone (FLONASE) 50 MCG/ACT nasal spray Place 1 spray into both nostrils daily as needed for allergies or rhinitis.   ketotifen (ZADITOR) 0.025 % ophthalmic solution Place 1 drop into both eyes 2 (two) times daily as needed (itchy eyes).   levothyroxine (SYNTHROID) 88 MCG tablet Take 88 mcg by mouth every morning.   mineral oil-hydrophilic petrolatum (AQUAPHOR) ointment Apply 1 Application topically as needed (wound care).   oxyCODONE (  ROXICODONE) 5 MG immediate release tablet Take 1 tablet (5 mg total) by mouth every 4 (four) hours as needed for severe pain.   potassium chloride SA (KLOR-CON M) 20 MEQ tablet Take 1 tablet (20 mEq total) by mouth every other day.   predniSONE (DELTASONE) 5 MG tablet Take 5 mg by mouth daily with breakfast. TAKING '15mg'$  QD FOR 3Wks   torsemide (DEMADEX) 20 MG tablet Take 40 mg by mouth daily.     Allergies:   Patient has no known allergies.   Social History   Socioeconomic History   Marital status: Widowed    Spouse name: Not on file   Number of children: 2   Years of education: Not on file   Highest education level: Not on file  Occupational History   Occupation: Retired Market researcher at [employer] Use   Smoking status: Former    Packs/day: 0.50    Years: 10.00    Total pack years: 5.00    Types: Cigarettes    Start date: 01/16/1974    Quit date: 09/05/1983    Years since quitting: 38.9   Smokeless tobacco: Former    Types: Nurse, children's Use: Never used  Substance and Sexual Activity   Alcohol use: Not Currently    Alcohol/week: 0.0 standard drinks of alcohol   Drug use: No   Sexual activity: Not on file  Other Topics Concern   Not on file  Social History Narrative   Not on file   Social Determinants of Health   Financial  Resource Strain: Low Risk  (06/30/2022)   Overall Financial Resource Strain (CARDIA)    Difficulty of Paying Living Expenses: Not hard at all  Food Insecurity: No Food Insecurity (06/30/2022)   Hunger Vital Sign    Worried About Running Out of Food in the Last Year: Never true    [place] in the Last Year: Never true  Transportation Needs: No Transportation Needs (06/30/2022)   PRAPARE - Hydrologist (Medical): No    Lack of Transportation (Non-Medical): No  Physical Activity: Not on file  Stress: Not on file  Social Connections: Not on file     Family History: The patient's family history includes Arthritis in his father; Heart Problems in his sister; Heart disease in his father; Other in his brother, daughter, mother, and sister; Suicidality in his brother.  ROS:   Please see the history of present illness.    + diffuse bruising of arms and hands + severe bilateral Hudson edema, left > right All other systems reviewed and are negative.  Labs/Other Studies Reviewed:    The following studies were reviewed today:  DVT VAS [place] 08/15/22 Summary:  BILATERAL:  - No evidence of deep vein thrombosis seen in the lower extremities,  bilaterally.  -No evidence of popliteal cyst, bilaterally.  -Significant amount subcutaneous edema extending from popliteal fossa to  ankle, bilaterally.  Echo 07/31/22 1. Left ventricular ejection fraction, by estimation, is 50 to 55%. The  left ventricle has low normal function. The left ventricle has no regional  wall motion abnormalities. Left ventricular diastolic parameters are  consistent with Grade II diastolic  dysfunction (pseudonormalization).   2. Right ventricular systolic function is mildly reduced. The right  ventricular size is normal. There is normal pulmonary artery systolic  pressure. The estimated right ventricular systolic pressure is 85.4 mmHg.   3. Left atrial size was severely dilated.  4. Right  atrial size was severely dilated.   5. The mitral valve is degenerative. Mild mitral valve regurgitation. No  evidence of mitral stenosis. Moderate to severe mitral annular  calcification.   6. Tricuspid valve regurgitation is moderate.   7. The aortic valve has been repaired/replaced. There is a 26 mm Ultra,  stented (TAVR) valve present in the aortic position.      Perivalvular Aortic valve regurgitation is not visualized. No aortic  stenosis is present. Aortic valve mean gradient measures 7.0 mmHg. Aortic  valve peak gradient measures 14.3 mmHg. Aortic valve area, by VTI measures  1.80 cm.   8. The inferior vena cava is normal in size with greater than 50%  respiratory variability, suggesting right atrial pressure of 3 mmHg.   9. No significant change compared to study 02/2020   CT Coronary 04/07/19  1. Patient is post AVR with 25 mm Edwards 3000 Magna stented pericardial tissue valve. Sewing ring intact Possible perforation at base of non coronary cusp   2. See measurements above coronary arteries seem low risk of occlusion for valve in valve TAVR   3. Normal aortic root 3.4 cm with normal arch vessels and tortuous left subclavian   4. Suitable for a 26 mm Sapien 3 valve in valve procedure for severe central not peri valvular AR  Recent Labs: 02/24/2022: TSH 0.691 02/28/2022: B Natriuretic Peptide 256.0 03/10/2022: Magnesium 2.0 08/21/2022: ALT 12; BUN 31; Creatinine, Ser 1.76; Hemoglobin 9.0; Platelets 179; Potassium 4.7; Sodium 138  Recent Lipid Panel    Risk Assessment/Calculations:    CHA2DS2-VASc Score = 3  {This indicates a 3.2% annual risk of stroke. The patient's score is based upon: CHF History: 1 HTN History: 0 Diabetes History: 0 Stroke History: 0 Vascular Disease History: 0 Age Score: 2 Gender Score: 0    Physical Exam:    VS:  BP 116/70   Pulse 62   Ht '5\' 2"'$  (1.575 m)   Wt 109 lb 9.6 oz (49.7 kg)   SpO2 99%   BMI 20.05 kg/m     Wt Readings  from Last 3 Encounters:  08/24/22 109 lb 9.6 oz (49.7 kg)  08/21/22 113 lb 3.2 oz (51.3 kg)  08/17/22 113 lb 3.2 oz (51.3 kg)     GEN: Frail, chronically ill appearing in no acute distress HEENT: Normal NECK: No JVD; No carotid bruits CARDIAC: RRR, no murmurs, rubs, gallops RESPIRATORY:  Clear to auscultation without rales, wheezing or rhonchi  ABDOMEN: Soft, non-tender, non-distended MUSCULOSKELETAL:  No edema; No deformity. 2+ pedal pulses, equal bilaterally SKIN: Warm and dry NEUROLOGIC:  Alert and oriented x 3 PSYCHIATRIC:  Normal affect   EKG:  EKG is not ordered today.    Diagnoses:    1. Bilateral leg edema   2. Chronic combined CHF   3. Nonrheumatic aortic valve stenosis   4. Nonrheumatic aortic valve insufficiency   5. S/P TAVR (transcatheter aortic valve replacement)   6. S/P aortic valve replacement with bioprosthetic valve   7. Cardiac pacemaker in situ    Assessment and Plan:     Leg edema: Severe LLE edema, likely multifactorial in the setting of CKD, thrombocytopenia, chronic combined CHF, venous insufficiency and sedentary lifestyle. No evidence of lymphadenopathy on imaging 02/28/22. Wife reports weeping at times. Compression caused more pain, and ace wraps did not provide any improvement. He does not want to try home compression pumps through [employer]. Little improvement on increased dose of diuretics. Will have Hudson reduce  Torsemide to 40 mg am and 20 mg in the pm (previous regular dose).  Emphasized the importance of leg elevation, low sodium diet. With grade 2 diastolic dysfunction, would like to try SGLT2i to see if this helps.  Chronic combined CHF: Mildly reduced LVEF 50 to 55%, G2 DD, mildly reduced RVSF on echo 07/31/22. Weight has been stable. Has severe leg edema, left > right. Otherwise appears euvolemic. Would like to trial SGLT2i to improve diastolic function. GFR is 37, creatinine has remained stable. Will start Jardiance 10 mg daily. No history  of chronic UTI. Advised wife to monitor for side effect and home BP and notify us with concerns.   Aortic valve disease: S/p pericardial AVR 2009 with subsequent severe valve dysfunction with severe AI. Underwent valve in valve TAVR 04/2019. Stable valve function with no AI or aortic stenosis on echo 07/31/22.  No evidence today of worsening valve function.  Continue to monitor clinically.  Atrial flutter: S/p AV node ablation. HR well-controlled. Asymptomatic. Eliquis dose 2.5 mg twice daily is appropriate for age/weight/creatinine.   PPM: Recent device check with Joshua Hudson 12/14. Normal device function.   Disposition: 1 month with me/3 months with Joshua Hudson  Medication Adjustments/Labs and Tests Ordered: Current medicines are reviewed at length with the patient today.  Concerns regarding medicines are outlined above.  No orders of the defined types were placed in this encounter.  Meds ordered this encounter  Medications   empagliflozin (JARDIANCE) 10 MG TABS tablet    Sig: Take 1 tablet (10 mg total) by mouth daily.    Dispense:  90 tablet    Refill:  3    Patient Instructions  Medication Instructions:   START Jardiance  one (1) tablet by mouth ( 10 mg) daily.   *If you need a refill on your cardiac medications before your next appointment, please call your pharmacy*   Lab Work:  None ordered. If you have labs (blood work) drawn today and your tests are completely normal, you will receive your results only by: [place] (if you have MyChart) OR A paper copy in the mail If you have any lab test that is abnormal or we need to change your treatment, we will call you to review the results.   Testing/Procedures:  None ordered.   Follow-Up: At [facility], you and your health needs are our priority.  As part of our continuing mission to provide you with exceptional heart care, we have created designated Provider Care Teams.  These Care Teams include your  primary Cardiologist (physician) and Advanced Practice Providers (APPs -  Physician Assistants and Nurse Practitioners) who all work together to provide you with the care you need, when you need it.  We recommend signing up for the patient portal called "MyChart".  Sign up information is provided on this After Visit Summary.  MyChart is used to connect with patients for Virtual Visits (Telemedicine).  Patients are able to view lab/test results, encounter notes, upcoming appointments, etc.  Non-urgent messages can be sent to your provider as well.   To learn more about what you can do with MyChart, go to [email or web address].    Your next appointment:   1 month(s)  The format for your next appointment:   In Person  Provider:   Christen Bame, NP         Other Instructions  Recommend weighing daily and keeping a log. Please take extra tablet ( 20 mg ) of torsemide  if you  have weight gain of 3 pounds overnight or 5 pounds in 1 week.   Date  Time Weight                                             Important Information About Sugar         Signed, Emmaline Life, NP  08/24/2022 10:22 AM    [place]

## 2022-08-24 ENCOUNTER — Ambulatory Visit: Payer: Medicare PPO | Attending: Nurse Practitioner | Admitting: Nurse Practitioner

## 2022-08-24 ENCOUNTER — Encounter: Payer: Self-pay | Admitting: Nurse Practitioner

## 2022-08-24 VITALS — BP 116/70 | HR 62 | Ht 62.0 in | Wt 109.6 lb

## 2022-08-24 DIAGNOSIS — R600 Localized edema: Secondary | ICD-10-CM

## 2022-08-24 DIAGNOSIS — Z952 Presence of prosthetic heart valve: Secondary | ICD-10-CM

## 2022-08-24 DIAGNOSIS — I5042 Chronic combined systolic (congestive) and diastolic (congestive) heart failure: Secondary | ICD-10-CM | POA: Diagnosis not present

## 2022-08-24 DIAGNOSIS — I350 Nonrheumatic aortic (valve) stenosis: Secondary | ICD-10-CM

## 2022-08-24 DIAGNOSIS — I351 Nonrheumatic aortic (valve) insufficiency: Secondary | ICD-10-CM

## 2022-08-24 DIAGNOSIS — Z953 Presence of xenogenic heart valve: Secondary | ICD-10-CM

## 2022-08-24 DIAGNOSIS — Z950 Presence of cardiac pacemaker: Secondary | ICD-10-CM

## 2022-08-24 MED ORDER — EMPAGLIFLOZIN 10 MG PO TABS: 10.0000 mg | ORAL_TABLET | Freq: Every day | ORAL | 3 refills | Status: DC

## 2022-08-24 NOTE — Patient Instructions (Addendum)
Medication Instructions:   START Jardiance  one (1) tablet by mouth ( 10 mg) daily.   *If you need a refill on your cardiac medications before your next appointment, please call your pharmacy*   Lab Work:  None ordered. If you have labs (blood work) drawn today and your tests are completely normal, you will receive your results only by: [place] (if you have MyChart) OR A paper copy in the mail If you have any lab test that is abnormal or we need to change your treatment, we will call you to review the results.   Testing/Procedures:  None ordered.   Follow-Up: At [facility], you and your health needs are our priority.  As part of our continuing mission to provide you with exceptional heart care, we have created designated Provider Care Teams.  These Care Teams include your primary Cardiologist (physician) and Advanced Practice Providers (APPs -  Physician Assistants and Nurse Practitioners) who all work together to provide you with the care you need, when you need it.  We recommend signing up for the patient portal called "MyChart".  Sign up information is provided on this After Visit Summary.  MyChart is used to connect with patients for Virtual Visits (Telemedicine).  Patients are able to view lab/test results, encounter notes, upcoming appointments, etc.  Non-urgent messages can be sent to your provider as well.   To learn more about what you can do with MyChart, go to [email or web address].    Your next appointment:   1 month(s)  The format for your next appointment:   In Person  Provider:   Christen Bame, NP         Other Instructions  Recommend weighing daily and keeping a log. Please take extra tablet ( 20 mg ) of torsemide  if you have weight gain of 3 pounds overnight or 5 pounds in 1 week.   Date  Time Weight                                             Important Information About Sugar

## 2022-08-25 DIAGNOSIS — E039 Hypothyroidism, unspecified: Secondary | ICD-10-CM | POA: Diagnosis not present

## 2022-08-25 DIAGNOSIS — I482 Chronic atrial fibrillation, unspecified: Secondary | ICD-10-CM | POA: Diagnosis not present

## 2022-08-25 DIAGNOSIS — M810 Age-related osteoporosis without current pathological fracture: Secondary | ICD-10-CM | POA: Diagnosis not present

## 2022-08-25 DIAGNOSIS — I5030 Unspecified diastolic (congestive) heart failure: Secondary | ICD-10-CM | POA: Diagnosis not present

## 2022-08-25 DIAGNOSIS — K219 Gastro-esophageal reflux disease without esophagitis: Secondary | ICD-10-CM | POA: Diagnosis not present

## 2022-08-25 DIAGNOSIS — N401 Enlarged prostate with lower urinary tract symptoms: Secondary | ICD-10-CM | POA: Diagnosis not present

## 2022-08-25 DIAGNOSIS — M069 Rheumatoid arthritis, unspecified: Secondary | ICD-10-CM | POA: Diagnosis not present

## 2022-09-05 ENCOUNTER — Inpatient Hospital Stay: Payer: Medicare PPO

## 2022-09-05 ENCOUNTER — Other Ambulatory Visit: Payer: Self-pay

## 2022-09-05 ENCOUNTER — Inpatient Hospital Stay: Payer: Medicare PPO | Attending: Hematology and Oncology

## 2022-09-05 VITALS — BP 120/72 | HR 62 | Temp 98.2°F | Resp 18

## 2022-09-05 DIAGNOSIS — D563 Thalassemia minor: Secondary | ICD-10-CM | POA: Insufficient documentation

## 2022-09-05 DIAGNOSIS — D638 Anemia in other chronic diseases classified elsewhere: Secondary | ICD-10-CM | POA: Insufficient documentation

## 2022-09-05 DIAGNOSIS — D598 Other acquired hemolytic anemias: Secondary | ICD-10-CM

## 2022-09-05 DIAGNOSIS — I5033 Acute on chronic diastolic (congestive) heart failure: Secondary | ICD-10-CM

## 2022-09-05 LAB — CBC WITH DIFFERENTIAL (CANCER CENTER ONLY)
Abs Immature Granulocytes: 0.04 10*3/uL (ref 0.00–0.07)
Basophils Absolute: 0 10*3/uL (ref 0.0–0.1)
Basophils Relative: 0 %
Eosinophils Absolute: 0.3 10*3/uL (ref 0.0–0.5)
Eosinophils Relative: 3 %
HCT: 27.2 % — ABNORMAL LOW (ref 39.0–52.0)
Hemoglobin: 8.5 g/dL — ABNORMAL LOW (ref 13.0–17.0)
Immature Granulocytes: 0 %
Lymphocytes Relative: 7 %
Lymphs Abs: 0.7 10*3/uL (ref 0.7–4.0)
MCH: 24.3 pg — ABNORMAL LOW (ref 26.0–34.0)
MCHC: 31.3 g/dL (ref 30.0–36.0)
MCV: 77.7 fL — ABNORMAL LOW (ref 80.0–100.0)
Monocytes Absolute: 0.7 10*3/uL (ref 0.1–1.0)
Monocytes Relative: 8 %
Neutro Abs: 7.9 10*3/uL — ABNORMAL HIGH (ref 1.7–7.7)
Neutrophils Relative %: 82 %
Platelet Count: 200 10*3/uL (ref 150–400)
RBC: 3.5 MIL/uL — ABNORMAL LOW (ref 4.22–5.81)
RDW: 18 % — ABNORMAL HIGH (ref 11.5–15.5)
WBC Count: 9.6 10*3/uL (ref 4.0–10.5)
nRBC: 0.4 % — ABNORMAL HIGH (ref 0.0–0.2)

## 2022-09-05 LAB — CMP (CANCER CENTER ONLY)
ALT: 8 U/L (ref 0–44)
AST: 15 U/L (ref 15–41)
Albumin: 3.7 g/dL (ref 3.5–5.0)
Alkaline Phosphatase: 47 U/L (ref 38–126)
Anion gap: 9 (ref 5–15)
BUN: 28 mg/dL — ABNORMAL HIGH (ref 8–23)
CO2: 29 mmol/L (ref 22–32)
Calcium: 8.9 mg/dL (ref 8.9–10.3)
Chloride: 102 mmol/L (ref 98–111)
Creatinine: 1.68 mg/dL — ABNORMAL HIGH (ref 0.61–1.24)
GFR, Estimated: 40 mL/min — ABNORMAL LOW (ref >60)
Glucose, Bld: 98 mg/dL (ref 70–99)
Potassium: 3.6 mmol/L (ref 3.5–5.1)
Sodium: 140 mmol/L (ref 135–145)
Total Bilirubin: 0.5 mg/dL (ref 0.3–1.2)
Total Protein: 5.9 g/dL — ABNORMAL LOW (ref 6.5–8.1)

## 2022-09-05 LAB — SAMPLE TO BLOOD BANK

## 2022-09-05 LAB — IRON AND IRON BINDING CAPACITY (CC-WL,HP ONLY)
Iron: 63 ug/dL (ref 45–182)
Saturation Ratios: 23 % (ref 17.9–39.5)
TIBC: 274 ug/dL (ref 250–450)
UIBC: 211 ug/dL (ref 117–376)

## 2022-09-05 LAB — VITAMIN B12: Vitamin B-12: 255 pg/mL (ref 180–914)

## 2022-09-05 LAB — PREPARE RBC (CROSSMATCH)

## 2022-09-05 LAB — FERRITIN: Ferritin: 49 ng/mL (ref 24–336)

## 2022-09-05 MED ORDER — DIPHENHYDRAMINE HCL 25 MG PO CAPS
25.0000 mg | ORAL_CAPSULE | Freq: Once | ORAL | Status: AC
  Administered 2022-09-05: 25 mg via ORAL
  Filled 2022-09-05: qty 1

## 2022-09-05 MED ORDER — EPOETIN ALFA-EPBX 40000 UNIT/ML IJ SOLN
40000.0000 [IU] | Freq: Once | INTRAMUSCULAR | Status: AC
  Administered 2022-09-05: 40000 [IU] via SUBCUTANEOUS
  Filled 2022-09-05: qty 1

## 2022-09-05 MED ORDER — ACETAMINOPHEN 325 MG PO TABS
650.0000 mg | ORAL_TABLET | Freq: Once | ORAL | Status: AC
  Administered 2022-09-05: 650 mg via ORAL
  Filled 2022-09-05: qty 2

## 2022-09-05 MED ORDER — EPOETIN ALFA 40000 UNIT/ML IJ SOLN
40000.0000 [IU] | Freq: Once | INTRAMUSCULAR | Status: DC
  Filled 2022-09-05: qty 1

## 2022-09-05 NOTE — Patient Instructions (Signed)

## 2022-09-05 NOTE — Progress Notes (Signed)
Orders entered for 1 unit PRBC per MD d/t symptomatic anemia; SHOB, weakness reported by pt. Hgb 8.5. Orders verified with [place] in [place]. Corene Cornea, RN aware.

## 2022-09-06 LAB — TYPE AND SCREEN
ABO/RH(D): A POS
Antibody Screen: NEGATIVE
Unit division: 0

## 2022-09-06 LAB — BPAM RBC
Blood Product Expiration Date: 202401202359
ISSUE DATE / TIME: 202401020933
Unit Type and Rh: 6200

## 2022-09-14 ENCOUNTER — Ambulatory Visit (INDEPENDENT_AMBULATORY_CARE_PROVIDER_SITE_OTHER): Payer: Medicare PPO

## 2022-09-14 DIAGNOSIS — I480 Paroxysmal atrial fibrillation: Secondary | ICD-10-CM

## 2022-09-14 LAB — CUP PACEART REMOTE DEVICE CHECK
Battery Remaining Longevity: 143 mo
Battery Voltage: 3.19 V
Brady Statistic RA Percent Paced: 0 %
Brady Statistic RV Percent Paced: 99.83 %
Date Time Interrogation Session: 20240111075725
Implantable Lead Connection Status: 753985
Implantable Lead Connection Status: 753985
Implantable Lead Implant Date: 20230908
Implantable Lead Implant Date: 20230908
Implantable Lead Location: 753859
Implantable Lead Location: 753860
Implantable Lead Model: 3830
Implantable Lead Model: 5076
Implantable Pulse Generator Implant Date: 20230908
Lead Channel Impedance Value: 247 Ohm
Lead Channel Impedance Value: 323 Ohm
Lead Channel Impedance Value: 437 Ohm
Lead Channel Impedance Value: 456 Ohm
Lead Channel Pacing Threshold Amplitude: 0.625 V
Lead Channel Pacing Threshold Amplitude: 0.625 V
Lead Channel Pacing Threshold Pulse Width: 0.4 ms
Lead Channel Pacing Threshold Pulse Width: 0.4 ms
Lead Channel Sensing Intrinsic Amplitude: 1.625 mV
Lead Channel Sensing Intrinsic Amplitude: 1.625 mV
Lead Channel Sensing Intrinsic Amplitude: 9.375 mV
Lead Channel Setting Pacing Amplitude: 2 V
Lead Channel Setting Pacing Amplitude: 3.25 V
Lead Channel Setting Pacing Pulse Width: 0.4 ms
Lead Channel Setting Sensing Sensitivity: 1.2 mV
Zone Setting Status: 755011

## 2022-09-25 ENCOUNTER — Encounter: Payer: Self-pay | Admitting: Physician Assistant

## 2022-09-25 NOTE — Progress Notes (Signed)
Cardiology Office Note    Date:  09/29/2022   ID:  Joshua Hudson Apr 28, 1935, MRN [id number]  PCP:  Corliss Blacker, MD  Cardiologist:  [name], MD  Electrophysiologist:  [name], MD   Chief Complaint: f/u edema  History of Present Illness:   Joshua Hudson is a 88 y.o. male with history of severe AS s/p bioprosthetic AVR in 2009 with post-op atrial fib, subsequent valve failure with severe AI s/p valve-in-valve TAVR 04/2019, HTN, mitral regurgitation, chronic HFpEF, persistent atrial flutter s/p ablation 2021 with recurrent afib/flutter with subsequent failure to maintain NSR despite amiodarone/repeat DCCVs s/p eventual AVN ablation with MDT PPM 05/2022, anemia of chronic disease followed by heme-onc requiring prior transfusion as well as GIB 02/2022, CKD 3b, esophageal stricture s/p dilations who is seen for follow-up.  Complex history reviewed. He did fairly well from the time between his initial bioprosthetic AVR in 2009 until around 2020 when he was seen for pre-op evaluation for orthopedic surgery and found to have signs of heart failure. Subsequent TEE confirmed severe AI and he underwent TAVR in 04/2019. Regarding coronary history, remote cath 2009 & cor CT showed nonobstructive CAD but most recent evaluation by cath 2020 in prep for TAVR showed angiographically minimal CAD. He also has significant arrhythmia history with prior post-op Afib 2009 (converted with NSR) then recurrent atrial fib/flutter beginning in 2021. With this he developed transient cardiomyopathy with EF 45-50% felt tachy-mediated. Despite amiodarone and cardioversions, his atrial fib/flutter persisted and eventually required AVN+PPM for rate control in 05/2022. History is also complicated by prior GIB with ABL anemia superimposed on anemia of chronic disease, with colonoscopy in 02/2022 showing nonbleeding angiectasia internal hemorrhoids 3 mm polyp in ascending colon resected EGD showing few bleeding  angiectasias in duodenum and jejunum. He has been continued on Eliquis at decreased dose, Afib managed by EP.  He was recently evaluated due to worsening lower extremity edema. 2D echo 07/31/22 EF 50-55%, G2DD, mildly reduced RVSF, normal PASP, severe BAE, mild MR, prior TAVR without AS/AR. LE venous duplex 08/15/22 negative for DVT, ++edema. He had required ER visit 08/21/22 for IV Lasix. His torsemide was increased to '80mg'$  QAM/'40mg'$  QPM. When seen in the office 08/25/23, he was still experiencing edema felt multifactorial in setting of CKD, CHF, venous insufficiency and sedentary lifestyle. Torsemide was reduced back to prior home dose of '40mg'$  QAM/'20mg'$  QPM and Jardiance was added.   He is seen back for follow-up with daughter Joshua Hudson. They both feel the Jardiance helped quite a bit. His edema has improved back to more baseline level - has never fully gone away in years but they are very pleased. He also feels he has more energy. No CP or SOB. No orthopnea. Weight stable. We discussed monitoring of his lipids since the last we have are pulling from 2009 - he would like to discuss with his PCP. We also reviewed SBE ppx which he states he gets through his dentist.  Labwork independently reviewed: 09/26/22 K 3.8, Cr 1.77 c/w prior trends 1.6-1.8s, hgb 9.1, plt 215 09/05/22 Hgb 8.5, plt 200, K 3.6, Cr 1.68, albumin 3.7, AST/ALT OK 03/2022 Mg 2.0 02/2022 TSH wnl, BNP 256 2009 LDL 100 Carotid 03/2020 was near-normal with only minimal plaque.      Past History   Past Medical History:  Diagnosis Date   Achalasia 06/12/2019   Noted on EGD   Aortic insufficiency 03/24/2019   AI of bioprosthetic AVR severe 4 plus  Atrial flutter (HCC)    Chronic diastolic CHF (congestive heart failure) ([place]) 03/24/2019   Chronic kidney disease, stage 3b (HCC)    Colon polyps    s/p diverticular perforation requiring 2-stage repair   Derangement of right shoulder joint    need replacing has no use of   Diverticulosis     DJD (degenerative joint disease), lumbosacral    Dysphagia    eats soft food   Esophageal stricture    GERD (gastroesophageal reflux disease)    GIB (gastrointestinal bleeding)    Hemolytic anemia ([place])    History of blood transfusion given with 04-15-19 surgery   History of blood transfusion 07/18/2019   History of kidney stones    passed stones   Hypertension    Mitral regurgitation    moderate   Occipital neuralgia    Pacemaker    AVN ablation + MDT PPM 2023   Pancytopenia (HCC)    Postoperative atrial fibrillation ([place]) 05/10/2015   Prosthetic valve dysfunction    Rheumatoid arthritis ([place])    s/o long term steroids  shoulders and hands   Rotator cuff arthropathy    right   S/P aortic valve replacement with bioprosthetic valve 01/16/2008   2m Edwards Magna perimount bovine pericardial tissue valve, model 3000   S/P valve-in-valve TAVR 04/15/2019   26 mm Edwards Sapien 3 Ultra transcatheter heart valve placed via percutaneous right transfemoral approach    SBE (subacute bacterial endocarditis) prophylaxis candidate    for dental procedures   Schatzki's ring 06/12/2019   Narrowing, Noted on EGD   Severe aortic stenosis    S/P prosthetic valve replacement w 25 mm Edwards like science percardial tissue valve,[place] - 01/2008   Thrombocytopenia ([place]     Past Surgical History:  Procedure Laterality Date   A-FLUTTER ABLATION N/A 10/10/2019   Procedure: A-FLUTTER ABLATION;  Surgeon: TEvans Lance MD;  [facility];  Service: Cardiovascular;  Laterality: N/A;   APPENDECTOMY     AV NODE ABLATION N/A 05/12/2022   Procedure: AV NODE ABLATION;  Surgeon: TEvans Lance MD;  [facility];  Service: Cardiovascular;  Laterality: N/A;   BALLOON DILATION N/A 06/12/2019   Procedure: BALLOON DILATION;  Surgeon: KRonnette Juniper MD;  [facility];  Service: Gastroenterology;  Laterality: N/A;   BOTOX INJECTION N/A 01/22/2018   Procedure: BOTOX  INJECTION;  Surgeon: KRonnette Juniper MD;  [facility];  Service: Gastroenterology;  Laterality: N/A;   CARDIAC CATHETERIZATION     09   CARDIAC VALVE REPLACEMENT  01/2008   aortic valve replacement   CARDIOVERSION N/A 09/25/2019   Procedure: CARDIOVERSION;  Surgeon: SJerline Pain MD;  [facility] [facility]  Service: Cardiovascular;  Laterality: N/A;   CARDIOVERSION N/A 12/25/2019   Procedure: CARDIOVERSION;  Surgeon: CLelon Perla MD;  [place] [facility]  Service: Cardiovascular;  Laterality: N/A;   CARDIOVERSION N/A 03/08/2022   Procedure: CARDIOVERSION;  Surgeon: PFreada Bergeron MD;  [facility];  Service: Cardiovascular;  Laterality: N/A;   CARDIOVERSION N/A 03/28/2022   Procedure: CARDIOVERSION;  Surgeon: TBerniece Salines DO;  [place] [facility]  Service: Cardiovascular;  Laterality: N/A;   CATARACT EXTRACTION W/ INTRAOCULAR LENS  IMPLANT, BILATERAL     COLON RESECTION     diverticulitis    COLONOSCOPY N/A 03/03/2022   Procedure: COLONOSCOPY;  Surgeon: BOtis Brace MD;  [facility];  Service: Gastroenterology;  Laterality: N/A;   CORONARY ANGIOPLASTY     dental  implants     permanent   ENTEROSCOPY N/A 03/03/2022   Procedure: ENTEROSCOPY;  Surgeon: Otis Brace, MD;  [facility];  Service: Gastroenterology;  Laterality: N/A;   ESOPHAGEAL MANOMETRY N/A 11/07/2017   Procedure: ESOPHAGEAL MANOMETRY (EM);  Surgeon: Ronnette Juniper, MD;  [facility];  Service: Gastroenterology;  Laterality: N/A;   ESOPHAGOGASTRODUODENOSCOPY N/A 02/24/2022   Procedure: ESOPHAGOGASTRODUODENOSCOPY (EGD);  Surgeon: Arta Silence, MD;  [facility];  Service: Gastroenterology;  Laterality: N/A;   ESOPHAGOGASTRODUODENOSCOPY (EGD) WITH PROPOFOL N/A 01/22/2018   Procedure: ESOPHAGOGASTRODUODENOSCOPY (EGD) WITH PROPOFOL;  Surgeon: Ronnette Juniper, MD;  [facility];  Service: Gastroenterology;  Laterality: N/A;    ESOPHAGOGASTRODUODENOSCOPY (EGD) WITH PROPOFOL N/A 04/30/2019   Procedure: ESOPHAGOGASTRODUODENOSCOPY (EGD) WITH PROPOFOL;  Surgeon: Laurence Spates, MD;  Location: [place];  Service: Endoscopy;  Laterality: N/A;   ESOPHAGOGASTRODUODENOSCOPY (EGD) WITH PROPOFOL N/A 06/12/2019   Procedure: ESOPHAGOGASTRODUODENOSCOPY (EGD) WITH PROPOFOL;  Surgeon: Ronnette Juniper, MD;  [facility];  Service: Gastroenterology;  Laterality: N/A;  with botox injection   EXCISIONAL TOTAL SHOULDER ARTHROPLASTY WITH ANTIBIOTIC SPACER Right 12/31/2018   Procedure: EXCISIONAL TOTAL SHOULDER ARTHROPLASTY WITH ANTIBIOTIC SPACER;  Surgeon: Netta Cedars, MD;  Location: [place];  Service: Orthopedics;  Laterality: Right;   GIVENS CAPSULE STUDY N/A 03/01/2022   Procedure: GIVENS CAPSULE STUDY;  Surgeon: Otis Brace, MD;  [facility];  Service: Gastroenterology;  Laterality: N/A;   HEMOSTASIS CLIP PLACEMENT  03/03/2022   Procedure: HEMOSTASIS CLIP PLACEMENT;  Surgeon: Otis Brace, MD;  [facility];  Service: Gastroenterology;;   HERNIA REPAIR     HOT HEMOSTASIS N/A 03/03/2022   Procedure: HOT HEMOSTASIS (ARGON PLASMA COAGULATION/BICAP);  Surgeon: Otis Brace, MD;  [facility];  Service: Gastroenterology;  Laterality: N/A;  EGD and COLON   INTRAOPERATIVE TRANSTHORACIC ECHOCARDIOGRAM N/A 04/15/2019   Procedure: Intraoperative Transthoracic Echocardiogram;  Surgeon: Sherren Mocha, MD;  Location: [place];  Service: Open Heart Surgery;  Laterality: N/A;   IRRIGATION AND DEBRIDEMENT SHOULDER Right 11/20/2018    IRRIGATION AND DEBRIDEMENT SHOULDER WITH POLY EXCHANGE (Right Shoulder)   IRRIGATION AND DEBRIDEMENT SHOULDER Right 11/20/2018   Procedure: IRRIGATION AND DEBRIDEMENT SHOULDER WITH POLY EXCHANGE;  Surgeon: Netta Cedars, MD;  Location: [place];  Service: Orthopedics;  Laterality: Right;   LUMBAR LAMINECTOMY     x 2   PACEMAKER IMPLANT N/A 05/12/2022   Procedure: PACEMAKER  IMPLANT;  Surgeon: Evans Lance, MD;  Location: [place] [facility];  Service: Cardiovascular;  Laterality: N/A;   POLYPECTOMY  03/03/2022   Procedure: POLYPECTOMY;  Surgeon: Otis Brace, MD;  [facility];  Service: Gastroenterology;;   REVERSE SHOULDER ARTHROPLASTY Right 03/01/2018   Procedure: RIGHT REVERSE SHOULDER ARTHROPLASTY;  Surgeon: Netta Cedars, MD;  Location: [place];  Service: Orthopedics;  Laterality: Right;   REVERSE SHOULDER ARTHROPLASTY Right 07/18/2019   Procedure: REVERSE TOTAL SHOULDER ARTHROPLASTY and removal of antiobotic spacer;  Surgeon: Netta Cedars, MD;  [facility];  Service: Orthopedics;  Laterality: Right;  interscalene block   REVERSE SHOULDER ARTHROPLASTY Right 08/06/2019   Procedure: Reduction of dislocated reverse total shoulder and poly exchange;  Surgeon: Netta Cedars, MD;  [facility];  Service: Orthopedics;  Laterality: Right;  need 1 hour   RIGHT/LEFT HEART CATH AND CORONARY ANGIOGRAPHY N/A 04/02/2019   Procedure: RIGHT/LEFT HEART CATH AND CORONARY ANGIOGRAPHY;  Surgeon: Leonie Man, MD;  Location: [place] [facility];  Service: Cardiovascular;  Laterality: N/A;   SAVORY DILATION N/A 01/22/2018   Procedure: SAVORY  DILATION;  Surgeon: Ronnette Juniper, MD;  [facility];  Service: Gastroenterology;  Laterality: N/A;   SAVORY DILATION N/A 04/30/2019   Procedure: SAVORY DILATION;  Surgeon: Laurence Spates, MD;  Location: [place];  Service: Endoscopy;  Laterality: N/A;  With fluro   SHOULDER HEMI-ARTHROPLASTY Right 06/14/2018   Procedure: RIGHT  REVERSE TOTAL SHOULDER OPEN POLY EXCHANGE;  Surgeon: Netta Cedars, MD;  Location: [place];  Service: Orthopedics;  Laterality: Right;   SUBMUCOSAL INJECTION  06/12/2019   Procedure: SUBMUCOSAL INJECTION;  Surgeon: Ronnette Juniper, MD;  [facility];  Service: Gastroenterology;;   TEE WITHOUT CARDIOVERSION N/A 01/29/2014   Procedure: TRANSESOPHAGEAL ECHOCARDIOGRAM (TEE);  Surgeon:  Sueanne Margarita, MD;  [facility];  Service: Cardiovascular;  Laterality: N/A;   TEE WITHOUT CARDIOVERSION N/A 04/02/2019   Procedure: TRANSESOPHAGEAL ECHOCARDIOGRAM (TEE);  Surgeon: Josue Hector, MD;  [facility];  Service: Cardiovascular;  Laterality: N/A;   TEE WITHOUT CARDIOVERSION N/A 09/25/2019   Procedure: TRANSESOPHAGEAL ECHOCARDIOGRAM (TEE);  Surgeon: Jerline Pain, MD;  [facility];  Service: Cardiovascular;  Laterality: N/A;   TEE WITHOUT CARDIOVERSION N/A 03/08/2022   Procedure: TRANSESOPHAGEAL ECHOCARDIOGRAM (TEE);  Surgeon: [name], MD;  Location[facility];  Service: Cardiovascular;  Laterality: N/A;   TONSILLECTOMY     TRANSCATHETER AORTIC VALVE REPLACEMENT, TRANSFEMORAL  04/15/2019   TRANSCATHETER AORTIC VALVE REPLACEMENT, TRANSFEMORAL N/A 04/15/2019   Procedure: TRANSCATHETER AORTIC VALVE REPLACEMENT, TRANSFEMORAL with POST BALLOON DILATION;  Surgeon: Sherren Mocha, MD;  Location: [place];  Service: Open Heart Surgery;  Laterality: N/A;    Current Medications: Current Meds  Medication Sig   apixaban (ELIQUIS) 2.5 MG TABS tablet Take 1 tablet (2.5 mg total) by mouth 2 (two) times daily.   cholestyramine (QUESTRAN) 4 g packet Take 4 g by mouth daily.   empagliflozin (JARDIANCE) 10 MG TABS tablet Take 1 tablet (10 mg total) by mouth daily.   epoetin alfa-epbx (RETACRIT) 16109 UNIT/ML injection Inject 40,000 Units into the skin every 6 (six) weeks. As needed   finasteride (PROSCAR) 5 MG tablet Take 5 mg by mouth daily.   fluticasone (FLONASE) 50 MCG/ACT nasal spray Place 1 spray into both nostrils daily as needed for allergies or rhinitis.   ketotifen (ZADITOR) 0.025 % ophthalmic solution Place 1 drop into both eyes 2 (two) times daily as needed (itchy eyes).   levothyroxine (SYNTHROID) 88 MCG tablet Take 88 mcg by mouth every morning.   oxyCODONE (ROXICODONE) 5 MG immediate release tablet Take 1 tablet (5 mg total) by mouth every 4  (four) hours as needed for severe pain.   potassium chloride SA (KLOR-CON M) 20 MEQ tablet Take 1 tablet (20 mEq total) by mouth every other day.   predniSONE (DELTASONE) 5 MG tablet Take 5 mg by mouth daily with breakfast. TAKING '15mg'$  QD FOR 3Wks   torsemide (DEMADEX) 20 MG tablet Take 40 mg by mouth - confirmed pt takes 2 AM/1 PM despite rx listed 2 tablets daily.      Allergies:   Patient has no known allergies.   Social History   Socioeconomic History   Marital status: Widowed    Spouse name: Not on file   Number of children: 2   Years of education: Not on file   Highest education level: Not on file  Occupational History   Occupation: Retired Market researcher at [employer] Use   Smoking status: Former    Packs/day: 0.50    Years: 10.00    Total  pack years: 5.00    Types: Cigarettes    Start date: 01/16/1974    Quit date: 09/05/1983    Years since quitting: 39.0   Smokeless tobacco: Former    Types: Nurse, children's Use: Never used  Substance and Sexual Activity   Alcohol use: Not Currently    Alcohol/week: 0.0 standard drinks of alcohol   Drug use: No   Sexual activity: Not on file  Other Topics Concern   Not on file  Social History Narrative   Not on file   Social Determinants of Health   Financial Resource Strain: Low Risk  (06/30/2022)   Overall Financial Resource Strain (CARDIA)    Difficulty of Paying Living Expenses: Not hard at all  Food Insecurity: No Food Insecurity (06/30/2022)   Hunger Vital Sign    Worried About Running Out of Food in the Last Year: Never true    Ran Out of Food in the Last Year: Never true  Transportation Needs: No Transportation Needs (06/30/2022)   PRAPARE - Hydrologist (Medical): No    Lack of Transportation (Non-Medical): No  Physical Activity: Not on file  Stress: Not on file  Social Connections: Not on file     Family History:  The patient's family history includes Arthritis in his father;  Heart Problems in his sister; Heart disease in his father; Other in his brother, daughter, mother, and sister; Suicidality in his brother.  ROS:   Please see the history of present illness.  All other systems are reviewed and otherwise negative.    EKG(s)/Additional Testing   EKG:  EKG is not ordered today but reviewed last tracing 08/2022  CV Studies: Cardiac studies reviewed are outlined and summarized above. Otherwise please see EMR for full report.  Recent Labs: 02/24/2022: TSH 0.691 02/28/2022: B Natriuretic Peptide 256.0 03/10/2022: Magnesium 2.0 09/26/2022: ALT 8; BUN 32; Creatinine 1.77; Hemoglobin 9.1; Platelet Count 215; Potassium 3.8; Sodium 141  Recent Lipid Panel    Component Value Date/Time   CHOL  01/14/2008 0450    174        ATP III CLASSIFICATION:  <200     mg/dL   Desirable  200-239  mg/dL   Borderline High  >=240    mg/dL   High   TRIG 43 01/14/2008 0450   HDL 65 01/14/2008 0450   CHOLHDL 2.7 01/14/2008 0450   VLDL 9 01/14/2008 0450   LDLCALC (H) 01/14/2008 0450    100        Total Cholesterol/HDL:CHD Risk Coronary Heart Disease Risk Table                     Men   Women  1/2 Average Risk   3.4   3.3    PHYSICAL EXAM:    VS:  BP 106/62   Pulse 80   Ht '5\' 3"'$  (1.6 m)   Wt 109 lb (49.4 kg)   SpO2 96%   BMI 19.31 kg/m   BMI: Body mass index is 19.31 kg/m.  GEN: Well nourished, well developed male in no acute distress HEENT: normocephalic, atraumatic Neck: no JVD, carotid bruits, or masses Cardiac: RRR; no murmurs, rubs, or gallops, 1+ lower extremity edema to mid shin, pt reports much improved Respiratory:  clear to auscultation bilaterally, normal work of breathing GI: soft, nontender, nondistended, + BS MS: no deformity or atrophy Skin: warm and dry, no rash Neuro:  Alert and Oriented  x 3, Strength and sensation are intact, follows commands Psych: euthymic mood, full affect  Wt Readings from Last 3 Encounters:  09/29/22 109 lb (49.4 kg)   09/26/22 109 lb (49.4 kg)  08/24/22 109 lb 9.6 oz (49.7 kg)     ASSESSMENT & PLAN:   1. Chronic edema with chronic HFpEF - clinically improved with addition of Jardiance '10mg'$  daily. There is still edema present but he reports this is closer to baseline. He confirms he is taking torsemide '20mg'$  2 tablets in AM, 1 tablet in PM as previously instructed (his MAR only had 2 tablets daily). I would recommend we continue the torsemide how he is presently taking it, so will update rx. Continue Jardiance. Continue KCl supplementation at present dose. Recent labs at outside office appeared relatively stable from baseline. Reviewed 2g sodium restriction, symptom notification with patient. Also recommended elevation of legs when seated.  2. Aortic valve disease s/p prior bioprosthetic AVR then TAVR - 2D echo 07/31/22 EF 50-55%, G2DD, mildly reduced RVSF, normal PASP, severe BAE, mild MR, prior TAVR without AS/AR. Dr .Radford Pax recommended repeat echo 07/2023 which is in the queue. SBE px reviewed - he reports dentist prescribes this.  3. CKD stage 3b with Anemia - followed closely by heme-onc, labs reviewed above.  4. Paroxysmal atrial fib/flutter with PPM followed by EP - this is managed by Dr. Lovena Le who recommended f/u in 11/2022 at last OV which remains scheduled at this time. He is on low dose Eliquis adjusted for age, weight and creatinine.    Disposition: F/u with Dr. Lovena Le in 11/2022 and Dr. Radford Pax or me/Michelle Swinyer in 6 months.   Medication Adjustments/Labs and Tests Ordered: Current medicines are reviewed at length with the patient today.  Concerns regarding medicines are outlined above. Medication changes, Labs and Tests ordered today are summarized above and listed in the Patient Instructions accessible in Encounters.   Signed, Charlie Pitter, PA-C  09/29/2022 9:04 AM    [place] Phone: [phone]; Fax: [phone]

## 2022-09-26 ENCOUNTER — Inpatient Hospital Stay (HOSPITAL_BASED_OUTPATIENT_CLINIC_OR_DEPARTMENT_OTHER): Payer: Medicare PPO

## 2022-09-26 ENCOUNTER — Inpatient Hospital Stay: Payer: Medicare PPO | Admitting: Hematology and Oncology

## 2022-09-26 ENCOUNTER — Inpatient Hospital Stay: Payer: Medicare PPO

## 2022-09-26 ENCOUNTER — Other Ambulatory Visit: Payer: Self-pay

## 2022-09-26 VITALS — BP 125/72 | HR 81 | Temp 97.5°F | Resp 17 | Wt 109.0 lb

## 2022-09-26 DIAGNOSIS — D638 Anemia in other chronic diseases classified elsewhere: Secondary | ICD-10-CM

## 2022-09-26 DIAGNOSIS — D563 Thalassemia minor: Secondary | ICD-10-CM

## 2022-09-26 LAB — SAMPLE TO BLOOD BANK

## 2022-09-26 LAB — CBC WITH DIFFERENTIAL (CANCER CENTER ONLY)
Abs Immature Granulocytes: 0.08 10*3/uL — ABNORMAL HIGH (ref 0.00–0.07)
Basophils Absolute: 0 10*3/uL (ref 0.0–0.1)
Basophils Relative: 0 %
Eosinophils Absolute: 0.3 10*3/uL (ref 0.0–0.5)
Eosinophils Relative: 3 %
HCT: 28.6 % — ABNORMAL LOW (ref 39.0–52.0)
Hemoglobin: 9.1 g/dL — ABNORMAL LOW (ref 13.0–17.0)
Immature Granulocytes: 1 %
Lymphocytes Relative: 6 %
Lymphs Abs: 0.7 10*3/uL (ref 0.7–4.0)
MCH: 24.3 pg — ABNORMAL LOW (ref 26.0–34.0)
MCHC: 31.8 g/dL (ref 30.0–36.0)
MCV: 76.3 fL — ABNORMAL LOW (ref 80.0–100.0)
Monocytes Absolute: 0.8 10*3/uL (ref 0.1–1.0)
Monocytes Relative: 6 %
Neutro Abs: 10.3 10*3/uL — ABNORMAL HIGH (ref 1.7–7.7)
Neutrophils Relative %: 84 %
Platelet Count: 215 10*3/uL (ref 150–400)
RBC: 3.75 MIL/uL — ABNORMAL LOW (ref 4.22–5.81)
RDW: 17.1 % — ABNORMAL HIGH (ref 11.5–15.5)
WBC Count: 12.2 10*3/uL — ABNORMAL HIGH (ref 4.0–10.5)
nRBC: 0.2 % (ref 0.0–0.2)

## 2022-09-26 LAB — CMP (CANCER CENTER ONLY)
ALT: 8 U/L (ref 0–44)
AST: 14 U/L — ABNORMAL LOW (ref 15–41)
Albumin: 3.7 g/dL (ref 3.5–5.0)
Alkaline Phosphatase: 53 U/L (ref 38–126)
Anion gap: 8 (ref 5–15)
BUN: 32 mg/dL — ABNORMAL HIGH (ref 8–23)
CO2: 29 mmol/L (ref 22–32)
Calcium: 8.9 mg/dL (ref 8.9–10.3)
Chloride: 104 mmol/L (ref 98–111)
Creatinine: 1.77 mg/dL — ABNORMAL HIGH (ref 0.61–1.24)
GFR, Estimated: 37 mL/min — ABNORMAL LOW (ref >60)
Glucose, Bld: 89 mg/dL (ref 70–99)
Potassium: 3.8 mmol/L (ref 3.5–5.1)
Sodium: 141 mmol/L (ref 135–145)
Total Bilirubin: 0.4 mg/dL (ref 0.3–1.2)
Total Protein: 5.7 g/dL — ABNORMAL LOW (ref 6.5–8.1)

## 2022-09-26 LAB — IRON AND IRON BINDING CAPACITY (CC-WL,HP ONLY)
Iron: 60 ug/dL (ref 45–182)
Saturation Ratios: 21 % (ref 17.9–39.5)
TIBC: 288 ug/dL (ref 250–450)
UIBC: 228 ug/dL (ref 117–376)

## 2022-09-26 LAB — FERRITIN: Ferritin: 29 ng/mL (ref 24–336)

## 2022-09-26 MED ORDER — EPOETIN ALFA 40000 UNIT/ML IJ SOLN: 40000.0000 [IU] | Freq: Once | INTRAMUSCULAR | Status: DC

## 2022-09-26 MED ORDER — EPOETIN ALFA-EPBX 40000 UNIT/ML IJ SOLN
40000.0000 [IU] | Freq: Once | INTRAMUSCULAR | Status: AC
  Administered 2022-09-26: 40000 [IU] via SUBCUTANEOUS
  Filled 2022-09-26: qty 1

## 2022-09-26 NOTE — Assessment & Plan Note (Signed)
Hemoglobin electrophoresis: Beta thalassemia minor.  This is the cause of microcytosis.  It is not iron deficiency related.   Possible differential: Anemia of chronic disease versus myelodysplastic syndrome versus methotrexate related toxicity.     Hospitalization 09/22/2019-09/25/2019: A. fib with RVR status post cardioversion, currently on Eliquis Hospitalization 02/28/2022-03/11/2022: Atrial fibrillation: Cardioversion planned for 03/22/2022. 05/12/22: Ablation and Pacemaker Implantation    Lab review: 09/25/2019: WBC 7.2, hemoglobin 9.6, platelets 145 04/14/22: Hb 7.8, Platelets 91 (PRBC) 06/14/22: Hb 10.4, Pl 151, Cr 1.66, Iron sat: 16%, Ferritin 287 07/05/22: Hemoglobin 9.7 08/14/22: Hemoglobin 7.4   I recommended 2 units of PRBC today. Lower extremity swelling and pain: Ultrasound lower extremity 08/15/2022: Negative  Return to clinic as previously scheduled for labs and blood transfusions.

## 2022-09-26 NOTE — Patient Instructions (Signed)

## 2022-09-26 NOTE — Progress Notes (Signed)
Patient Care Team: Corliss Blacker, MD as PCP - General (Internal Medicine) Sueanne Margarita, MD as PCP - Cardiology (Cardiology) Evans Lance, MD as PCP - Electrophysiology (Cardiology) Sueanne Margarita, MD as Consulting Physician (Cardiology) Nicholas Lose, MD as Consulting Physician (Hematology and Oncology)  DIAGNOSIS:  Encounter Diagnosis  Name Primary?   Anemia of chronic disease Yes    CHIEF COMPLIANT:  Follow up anemia   INTERVAL HISTORY: Joshua Hudson is a 88 year old with above-mentioned history of chronic anemia request blood transfusions periodically. He presents to the clinic for a follow-up. He states he still feel fatigue.    ALLERGIES:  has No Known Allergies.  MEDICATIONS:  Current Outpatient Medications  Medication Sig Dispense Refill   apixaban (ELIQUIS) 2.5 MG TABS tablet Take 1 tablet (2.5 mg total) by mouth 2 (two) times daily. 180 tablet 1   cholestyramine (QUESTRAN) 4 g packet Take 4 g by mouth daily.     empagliflozin (JARDIANCE) 10 MG TABS tablet Take 1 tablet (10 mg total) by mouth daily. 90 tablet 3   epoetin alfa-epbx (RETACRIT) 27253 UNIT/ML injection Inject 40,000 Units into the skin every 6 (six) weeks. As needed     finasteride (PROSCAR) 5 MG tablet Take 5 mg by mouth daily.     fluticasone (FLONASE) 50 MCG/ACT nasal spray Place 1 spray into both nostrils daily as needed for allergies or rhinitis.     ketotifen (ZADITOR) 0.025 % ophthalmic solution Place 1 drop into both eyes 2 (two) times daily as needed (itchy eyes).     levothyroxine (SYNTHROID) 88 MCG tablet Take 88 mcg by mouth every morning.     oxyCODONE (ROXICODONE) 5 MG immediate release tablet Take 1 tablet (5 mg total) by mouth every 4 (four) hours as needed for severe pain. 10 tablet 0   potassium chloride SA (KLOR-CON M) 20 MEQ tablet Take 1 tablet (20 mEq total) by mouth every other day. 45 tablet 1   predniSONE (DELTASONE) 5 MG tablet Take 5 mg by mouth daily with breakfast.  TAKING '15mg'$  QD FOR 3Wks     torsemide (DEMADEX) 20 MG tablet Take 40 mg by mouth daily.     pantoprazole (PROTONIX) 40 MG tablet Take 1 tablet (40 mg total) by mouth 2 (two) times daily before a meal for 30 days, THEN 1 tablet (40 mg total) daily. 120 tablet 0   No current facility-administered medications for this visit.    PHYSICAL EXAMINATION: ECOG PERFORMANCE STATUS: 1 - Symptomatic but completely ambulatory  Vitals:   09/26/22 0802  BP: 125/72  Pulse: 81  Resp: 17  Temp: (!) 97.5 F (36.4 C)  SpO2: 100%   Filed Weights   09/26/22 0802  Weight: 109 lb (49.4 kg)      LABORATORY DATA:  I have reviewed the data as listed    Latest Ref Rng & Units 09/05/2022    7:47 AM 08/21/2022    6:35 PM 08/14/2022    7:36 AM  CMP  Glucose 70 - 99 mg/dL 98  110  104   BUN 8 - 23 mg/dL 28  31  34   Creatinine 0.61 - 1.24 mg/dL 1.68  1.76  1.84   Sodium 135 - 145 mmol/L 140  138  142   Potassium 3.5 - 5.1 mmol/L 3.6  4.7  3.6   Chloride 98 - 111 mmol/L 102  100  103   CO2 22 - 32 mmol/L 29  30  30  Calcium 8.9 - 10.3 mg/dL 8.9  9.5  9.6   Total Protein 6.5 - 8.1 g/dL 5.9  6.1  6.1   Total Bilirubin 0.3 - 1.2 mg/dL 0.5  0.6  0.6   Alkaline Phos 38 - 126 U/L 47  45  62   AST 15 - 41 U/L '15  17  15   '$ ALT 0 - 44 U/L '8  12  10     '$ Lab Results  Component Value Date   WBC 12.2 (H) 09/26/2022   HGB 9.1 (L) 09/26/2022   HCT 28.6 (L) 09/26/2022   MCV 76.3 (L) 09/26/2022   PLT 215 09/26/2022   NEUTROABS 10.3 (H) 09/26/2022    ASSESSMENT & PLAN:  Anemia of chronic disease Hemoglobin electrophoresis: Beta thalassemia minor.  This is the cause of microcytosis.  It is not iron deficiency related.   Possible differential: Anemia of chronic disease versus myelodysplastic syndrome versus methotrexate related toxicity.     Hospitalization 09/22/2019-09/25/2019: A. fib with RVR status post cardioversion, currently on Eliquis Hospitalization 02/28/2022-03/11/2022: Atrial fibrillation:  Cardioversion planned for 03/22/2022. 05/12/22: Ablation and Pacemaker Implantation    Lab review: 09/25/2019: WBC 7.2, hemoglobin 9.6, platelets 145 04/14/22: Hb 7.8, Platelets 91 (PRBC) 06/14/22: Hb 10.4, Pl 151, Cr 1.66, Iron sat: 16%, Ferritin 287 07/05/22: Hemoglobin 9.7 08/14/22: Hemoglobin 7.4 09/26/2022: Hemoglobin 9.1 (Retacrit injection today)     Lower extremity swelling and pain: Ultrasound lower extremity 08/15/2022: Negative  Return to clinic as previously scheduled for labs and blood transfusions.   No orders of the defined types were placed in this encounter.  The patient has a good understanding of the overall plan. he agrees with it. he will call with any problems that may develop before the next visit here. Total time spent: 30 mins including face to face time and time spent for planning, charting and co-ordination of care   [name], MD 09/26/22    I Gardiner Coins am acting as a Education administrator for [employer]  I have reviewed the above documentation for accuracy and completeness, and I agree with the above.

## 2022-09-27 ENCOUNTER — Ambulatory Visit: Payer: Medicare PPO | Admitting: Nurse Practitioner

## 2022-09-29 ENCOUNTER — Encounter: Payer: Self-pay | Admitting: Physician Assistant

## 2022-09-29 ENCOUNTER — Ambulatory Visit: Payer: Medicare PPO | Attending: Nurse Practitioner | Admitting: Physician Assistant

## 2022-09-29 VITALS — BP 106/62 | HR 80 | Ht 63.0 in | Wt 109.0 lb

## 2022-09-29 DIAGNOSIS — I5032 Chronic diastolic (congestive) heart failure: Secondary | ICD-10-CM

## 2022-09-29 DIAGNOSIS — I4892 Unspecified atrial flutter: Secondary | ICD-10-CM

## 2022-09-29 DIAGNOSIS — N1832 Chronic kidney disease, stage 3b: Secondary | ICD-10-CM | POA: Diagnosis not present

## 2022-09-29 DIAGNOSIS — R609 Edema, unspecified: Secondary | ICD-10-CM

## 2022-09-29 DIAGNOSIS — I4819 Other persistent atrial fibrillation: Secondary | ICD-10-CM

## 2022-09-29 DIAGNOSIS — Z952 Presence of prosthetic heart valve: Secondary | ICD-10-CM | POA: Diagnosis not present

## 2022-09-29 DIAGNOSIS — D638 Anemia in other chronic diseases classified elsewhere: Secondary | ICD-10-CM

## 2022-09-29 MED ORDER — TORSEMIDE 20 MG PO TABS: ORAL_TABLET | ORAL | 1 refills | Status: DC

## 2022-09-29 NOTE — Addendum Note (Signed)
Addended by: Charlie Pitter on: 09/29/2022 05:21 PM   Modules accepted: Orders

## 2022-09-29 NOTE — Patient Instructions (Addendum)
Medication Instructions:  Your physician recommends that you continue on your current medications as directed. Please refer to the Current Medication list given to you today.  *If you need a refill on your cardiac medications before your next appointment, please call your pharmacy*  Lab Work: None ordered If you have labs (blood work) drawn today and your tests are completely normal, you will receive your results only by: [place] (if you have MyChart) OR A paper copy in the mail If you have any lab test that is abnormal or we need to change your treatment, we will call you to review the results.  Follow-Up: At [facility], you and your health needs are our priority.  As part of our continuing mission to provide you with exceptional heart care, we have created designated Provider Care Teams.  These Care Teams include your primary Cardiologist (physician) and Advanced Practice Providers (APPs -  Physician Assistants and Nurse Practitioners) who all work together to provide you with the care you need, when you need it.  Your next appointment:   6 month(s)  Provider:   [name], MD  or Melina Copa, PA-C or Christen Bame, NP     Keep 11/16/22 at 8:00 AM with Dr Lovena Le    Other Instructions Be sure to discuss follow up of cholesterol with your primary care provider  Endocarditis Information  You may be at risk for developing endocarditis since you have an artificial heart valve or a repaired heart valve. Endocarditis is an infection of the lining of the heart or heart valves. Certain surgical and dental procedures may put you at risk, such as teeth cleaning or other dental procedures or other medical procedures. Notify our office or your dentist before having any dental work or invasive/surgical procedures. You will need to take antibiotics before certain procedures. To prevent endocarditis, maintain good oral health. Seek prompt medical attention for any mouth/gum, skin or  urinary tract infections.

## 2022-10-04 NOTE — Progress Notes (Signed)
Remote pacemaker transmission.   

## 2022-10-17 ENCOUNTER — Inpatient Hospital Stay: Payer: Medicare PPO

## 2022-10-17 ENCOUNTER — Inpatient Hospital Stay: Payer: Medicare PPO | Attending: Hematology and Oncology

## 2022-10-17 ENCOUNTER — Other Ambulatory Visit: Payer: Self-pay

## 2022-10-17 VITALS — BP 135/80 | HR 60 | Temp 97.9°F | Resp 18

## 2022-10-17 DIAGNOSIS — D638 Anemia in other chronic diseases classified elsewhere: Secondary | ICD-10-CM | POA: Diagnosis present

## 2022-10-17 DIAGNOSIS — D696 Thrombocytopenia, unspecified: Secondary | ICD-10-CM

## 2022-10-17 DIAGNOSIS — D563 Thalassemia minor: Secondary | ICD-10-CM | POA: Diagnosis present

## 2022-10-17 DIAGNOSIS — I5033 Acute on chronic diastolic (congestive) heart failure: Secondary | ICD-10-CM

## 2022-10-17 LAB — CBC WITH DIFFERENTIAL (CANCER CENTER ONLY)
Abs Immature Granulocytes: 0.05 10*3/uL (ref 0.00–0.07)
Basophils Absolute: 0 10*3/uL (ref 0.0–0.1)
Basophils Relative: 0 %
Eosinophils Absolute: 0.2 10*3/uL (ref 0.0–0.5)
Eosinophils Relative: 1 %
HCT: 25.3 % — ABNORMAL LOW (ref 39.0–52.0)
Hemoglobin: 7.8 g/dL — ABNORMAL LOW (ref 13.0–17.0)
Immature Granulocytes: 1 %
Lymphocytes Relative: 6 %
Lymphs Abs: 0.6 10*3/uL — ABNORMAL LOW (ref 0.7–4.0)
MCH: 22.4 pg — ABNORMAL LOW (ref 26.0–34.0)
MCHC: 30.8 g/dL (ref 30.0–36.0)
MCV: 72.7 fL — ABNORMAL LOW (ref 80.0–100.0)
Monocytes Absolute: 0.7 10*3/uL (ref 0.1–1.0)
Monocytes Relative: 7 %
Neutro Abs: 9.3 10*3/uL — ABNORMAL HIGH (ref 1.7–7.7)
Neutrophils Relative %: 85 %
Platelet Count: 189 10*3/uL (ref 150–400)
RBC: 3.48 MIL/uL — ABNORMAL LOW (ref 4.22–5.81)
RDW: 16.4 % — ABNORMAL HIGH (ref 11.5–15.5)
WBC Count: 10.9 10*3/uL — ABNORMAL HIGH (ref 4.0–10.5)
nRBC: 0.3 % — ABNORMAL HIGH (ref 0.0–0.2)

## 2022-10-17 LAB — IRON AND IRON BINDING CAPACITY (CC-WL,HP ONLY)
Iron: 29 ug/dL — ABNORMAL LOW (ref 45–182)
Saturation Ratios: 9 % — ABNORMAL LOW (ref 17.9–39.5)
TIBC: 336 ug/dL (ref 250–450)
UIBC: 307 ug/dL (ref 117–376)

## 2022-10-17 LAB — CMP (CANCER CENTER ONLY)
ALT: 12 U/L (ref 0–44)
AST: 18 U/L (ref 15–41)
Albumin: 4 g/dL (ref 3.5–5.0)
Alkaline Phosphatase: 55 U/L (ref 38–126)
Anion gap: 6 (ref 5–15)
BUN: 30 mg/dL — ABNORMAL HIGH (ref 8–23)
CO2: 29 mmol/L (ref 22–32)
Calcium: 9.4 mg/dL (ref 8.9–10.3)
Chloride: 107 mmol/L (ref 98–111)
Creatinine: 1.91 mg/dL — ABNORMAL HIGH (ref 0.61–1.24)
GFR, Estimated: 34 mL/min — ABNORMAL LOW (ref >60)
Glucose, Bld: 93 mg/dL (ref 70–99)
Potassium: 4.6 mmol/L (ref 3.5–5.1)
Sodium: 142 mmol/L (ref 135–145)
Total Bilirubin: 0.4 mg/dL (ref 0.3–1.2)
Total Protein: 6.1 g/dL — ABNORMAL LOW (ref 6.5–8.1)

## 2022-10-17 LAB — SAMPLE TO BLOOD BANK

## 2022-10-17 LAB — FERRITIN: Ferritin: 16 ng/mL — ABNORMAL LOW (ref 24–336)

## 2022-10-17 LAB — PREPARE RBC (CROSSMATCH)

## 2022-10-17 MED ORDER — SODIUM CHLORIDE 0.9% FLUSH: 3.0000 mL | INTRAVENOUS | Status: DC | PRN

## 2022-10-17 MED ORDER — ACETAMINOPHEN 325 MG PO TABS
650.0000 mg | ORAL_TABLET | Freq: Once | ORAL | Status: AC
  Administered 2022-10-17: 650 mg via ORAL
  Filled 2022-10-17: qty 2

## 2022-10-17 MED ORDER — DIPHENHYDRAMINE HCL 25 MG PO CAPS
25.0000 mg | ORAL_CAPSULE | Freq: Once | ORAL | Status: AC
  Administered 2022-10-17: 25 mg via ORAL
  Filled 2022-10-17: qty 1

## 2022-10-17 MED ORDER — EPOETIN ALFA 40000 UNIT/ML IJ SOLN: 40000.0000 [IU] | Freq: Once | INTRAMUSCULAR | Status: DC

## 2022-10-17 MED ORDER — SODIUM CHLORIDE 0.9% IV SOLUTION
250.0000 mL | Freq: Once | INTRAVENOUS | Status: AC
  Administered 2022-10-17: 250 mL via INTRAVENOUS

## 2022-10-17 NOTE — Progress Notes (Signed)
Pt's hgb 7.8 per MD transfuse 1 unit PRBC and hold epoetin. Infusion RN aware.

## 2022-10-18 LAB — TYPE AND SCREEN
ABO/RH(D): A POS
Antibody Screen: NEGATIVE
Unit division: 0

## 2022-10-18 LAB — BPAM RBC
Blood Product Expiration Date: 202403082359
ISSUE DATE / TIME: 202402130928
Unit Type and Rh: 6200

## 2022-10-20 NOTE — Progress Notes (Signed)
The following biosimilar Retacrit (epoetin alpha-epbx) has been selected for use in this patient.  Kennith Center, Pharm.D., CPP 10/20/2022@1$ :15 PM

## 2022-11-01 ENCOUNTER — Ambulatory Visit: Payer: Medicare PPO | Admitting: Internal Medicine

## 2022-11-02 NOTE — Progress Notes (Signed)
Patient Care Team: Corliss Blacker, MD as PCP - General (Internal Medicine) Sueanne Margarita, MD as PCP - Cardiology (Cardiology) Evans Lance, MD as PCP - Electrophysiology (Cardiology) Sueanne Margarita, MD as Consulting Physician (Cardiology) Nicholas Lose, MD as Consulting Physician (Hematology and Oncology)  DIAGNOSIS: No diagnosis found.  SUMMARY OF ONCOLOGIC HISTORY: Oncology History   No history exists.    CHIEF COMPLIANT:   INTERVAL HISTORY: Joshua Hudson is a 88 year old with above-mentioned history of chronic anemia request blood transfusions periodically. He presents to the clinic for a follow-up.    ALLERGIES:  has No Known Allergies.  MEDICATIONS:  Current Outpatient Medications  Medication Sig Dispense Refill   apixaban (ELIQUIS) 2.5 MG TABS tablet Take 1 tablet (2.5 mg total) by mouth 2 (two) times daily. 180 tablet 1   cholestyramine (QUESTRAN) 4 g packet Take 4 g by mouth daily.     empagliflozin (JARDIANCE) 10 MG TABS tablet Take 1 tablet (10 mg total) by mouth daily. 90 tablet 3   epoetin alfa-epbx (RETACRIT) 16109 UNIT/ML injection Inject 40,000 Units into the skin every 6 (six) weeks. As needed     finasteride (PROSCAR) 5 MG tablet Take 5 mg by mouth daily.     fluticasone (FLONASE) 50 MCG/ACT nasal spray Place 1 spray into both nostrils daily as needed for allergies or rhinitis.     ketotifen (ZADITOR) 0.025 % ophthalmic solution Place 1 drop into both eyes 2 (two) times daily as needed (itchy eyes).     levothyroxine (SYNTHROID) 88 MCG tablet Take 88 mcg by mouth every morning.     oxyCODONE (ROXICODONE) 5 MG immediate release tablet Take 1 tablet (5 mg total) by mouth every 4 (four) hours as needed for severe pain. 10 tablet 0   pantoprazole (PROTONIX) 40 MG tablet Take 1 tablet (40 mg total) by mouth 2 (two) times daily before a meal for 30 days, THEN 1 tablet (40 mg total) daily. 120 tablet 0   potassium chloride SA (KLOR-CON M) 20 MEQ tablet  Take 1 tablet (20 mEq total) by mouth every other day. 45 tablet 1   predniSONE (DELTASONE) 5 MG tablet Take 5 mg by mouth daily with breakfast. TAKING '15mg'$  QD FOR 3Wks     torsemide (DEMADEX) 20 MG tablet Take 40 mg (2  tablets) by mouth in the AM and 20 mg (1 tablet) by mouth in the PM 270 tablet 1   No current facility-administered medications for this visit.    PHYSICAL EXAMINATION: ECOG PERFORMANCE STATUS: {CHL ONC ECOG PS:[id number]}  There were no vitals filed for this visit. There were no vitals filed for this visit.  BREAST:*** No palpable masses or nodules in either right or left breasts. No palpable axillary supraclavicular or infraclavicular adenopathy no breast tenderness or nipple discharge. (exam performed in the presence of a chaperone)  LABORATORY DATA:  I have reviewed the data as listed    Latest Ref Rng & Units 10/17/2022    7:48 AM 09/26/2022    7:33 AM 09/05/2022    7:47 AM  CMP  Glucose 70 - 99 mg/dL 93  89  98   BUN 8 - 23 mg/dL 30  32  28   Creatinine 0.61 - 1.24 mg/dL 1.91  1.77  1.68   Sodium 135 - 145 mmol/L 142  141  140   Potassium 3.5 - 5.1 mmol/L 4.6  3.8  3.6   Chloride 98 - 111 mmol/L 107  104  102   CO2 22 - 32 mmol/L '29  29  29   '$ Calcium 8.9 - 10.3 mg/dL 9.4  8.9  8.9   Total Protein 6.5 - 8.1 g/dL 6.1  5.7  5.9   Total Bilirubin 0.3 - 1.2 mg/dL 0.4  0.4  0.5   Alkaline Phos 38 - 126 U/L 55  53  47   AST 15 - 41 U/L '18  14  15   '$ ALT 0 - 44 U/L '12  8  8     '$ Lab Results  Component Value Date   WBC 10.9 (H) 10/17/2022   HGB 7.8 (L) 10/17/2022   HCT 25.3 (L) 10/17/2022   MCV 72.7 (L) 10/17/2022   PLT 189 10/17/2022   NEUTROABS 9.3 (H) 10/17/2022    ASSESSMENT & PLAN:  No problem-specific Assessment & Plan notes found for this encounter.    No orders of the defined types were placed in this encounter.  The patient has a good understanding of the overall plan. he agrees with it. he will call with any problems that may develop before the  next visit here. Total time spent: 30 mins including face to face time and time spent for planning, charting and co-ordination of care   Suzzette Righter, [place] 11/02/22    I Gardiner Coins am acting as a Education administrator for [employer]  ***

## 2022-11-07 ENCOUNTER — Inpatient Hospital Stay: Payer: Medicare PPO

## 2022-11-07 ENCOUNTER — Inpatient Hospital Stay: Payer: Medicare PPO | Attending: Hematology and Oncology | Admitting: Hematology and Oncology

## 2022-11-07 VITALS — BP 121/76 | HR 84 | Temp 97.2°F | Resp 18 | Ht 63.0 in | Wt 109.0 lb

## 2022-11-07 VITALS — BP 135/82 | HR 60 | Temp 97.8°F | Resp 16

## 2022-11-07 DIAGNOSIS — D638 Anemia in other chronic diseases classified elsewhere: Secondary | ICD-10-CM

## 2022-11-07 DIAGNOSIS — D563 Thalassemia minor: Secondary | ICD-10-CM | POA: Diagnosis present

## 2022-11-07 DIAGNOSIS — D598 Other acquired hemolytic anemias: Secondary | ICD-10-CM

## 2022-11-07 LAB — CBC WITH DIFFERENTIAL (CANCER CENTER ONLY)
Abs Immature Granulocytes: 0.06 10*3/uL (ref 0.00–0.07)
Basophils Absolute: 0 10*3/uL (ref 0.0–0.1)
Basophils Relative: 0 %
Eosinophils Absolute: 0.4 10*3/uL (ref 0.0–0.5)
Eosinophils Relative: 3 %
HCT: 32.9 % — ABNORMAL LOW (ref 39.0–52.0)
Hemoglobin: 10.1 g/dL — ABNORMAL LOW (ref 13.0–17.0)
Immature Granulocytes: 1 %
Lymphocytes Relative: 9 %
Lymphs Abs: 0.9 10*3/uL (ref 0.7–4.0)
MCH: 22.5 pg — ABNORMAL LOW (ref 26.0–34.0)
MCHC: 30.7 g/dL (ref 30.0–36.0)
MCV: 73.4 fL — ABNORMAL LOW (ref 80.0–100.0)
Monocytes Absolute: 1 10*3/uL (ref 0.1–1.0)
Monocytes Relative: 9 %
Neutro Abs: 7.9 10*3/uL — ABNORMAL HIGH (ref 1.7–7.7)
Neutrophils Relative %: 78 %
Platelet Count: 179 10*3/uL (ref 150–400)
RBC: 4.48 MIL/uL (ref 4.22–5.81)
RDW: 19.3 % — ABNORMAL HIGH (ref 11.5–15.5)
WBC Count: 10.2 10*3/uL (ref 4.0–10.5)
nRBC: 0.2 % (ref 0.0–0.2)

## 2022-11-07 LAB — CMP (CANCER CENTER ONLY)
ALT: 8 U/L (ref 0–44)
AST: 15 U/L (ref 15–41)
Albumin: 4.1 g/dL (ref 3.5–5.0)
Alkaline Phosphatase: 54 U/L (ref 38–126)
Anion gap: 8 (ref 5–15)
BUN: 25 mg/dL — ABNORMAL HIGH (ref 8–23)
CO2: 32 mmol/L (ref 22–32)
Calcium: 9.6 mg/dL (ref 8.9–10.3)
Chloride: 102 mmol/L (ref 98–111)
Creatinine: 1.67 mg/dL — ABNORMAL HIGH (ref 0.61–1.24)
GFR, Estimated: 40 mL/min — ABNORMAL LOW (ref >60)
Glucose, Bld: 89 mg/dL (ref 70–99)
Potassium: 3.4 mmol/L — ABNORMAL LOW (ref 3.5–5.1)
Sodium: 142 mmol/L (ref 135–145)
Total Bilirubin: 0.4 mg/dL (ref 0.3–1.2)
Total Protein: 6.6 g/dL (ref 6.5–8.1)

## 2022-11-07 LAB — FERRITIN: Ferritin: 16 ng/mL — ABNORMAL LOW (ref 24–336)

## 2022-11-07 LAB — IRON AND IRON BINDING CAPACITY (CC-WL,HP ONLY)
Iron: 42 ug/dL — ABNORMAL LOW (ref 45–182)
Saturation Ratios: 12 % — ABNORMAL LOW (ref 17.9–39.5)
TIBC: 353 ug/dL (ref 250–450)
UIBC: 311 ug/dL (ref 117–376)

## 2022-11-07 LAB — SAMPLE TO BLOOD BANK

## 2022-11-07 LAB — VITAMIN B12: Vitamin B-12: 241 pg/mL (ref 180–914)

## 2022-11-07 MED ORDER — SODIUM CHLORIDE 0.9 % IV SOLN
300.0000 mg | Freq: Once | INTRAVENOUS | Status: AC
  Administered 2022-11-07: 300 mg via INTRAVENOUS
  Filled 2022-11-07: qty 15

## 2022-11-07 MED ORDER — SODIUM CHLORIDE 0.9 % IV SOLN: Freq: Once | INTRAVENOUS | Status: AC

## 2022-11-07 NOTE — Patient Instructions (Signed)

## 2022-11-07 NOTE — Assessment & Plan Note (Addendum)
Hemoglobin electrophoresis: Beta thalassemia minor.  This is the cause of microcytosis.  It is not iron deficiency related.   Possible differential: Anemia of chronic disease versus myelodysplastic syndrome versus methotrexate related toxicity.     Hospitalization 09/22/2019-09/25/2019: A. fib with RVR status post cardioversion, currently on Eliquis Hospitalization 02/28/2022-03/11/2022: Atrial fibrillation: Cardioversion planned for 03/22/2022. 05/12/22: Ablation and Pacemaker Implantation    Lab review: 09/25/2019: WBC 7.2, hemoglobin 9.6, platelets 145 04/14/22: Hb 7.8, Platelets 91 (PRBC) 06/14/22: Hb 10.4, Pl 151, Cr 1.66, Iron sat: 16%, Ferritin 287 07/05/22: Hemoglobin 9.7 08/14/22: Hemoglobin 7.4 09/26/2022: Hemoglobin 9.1 (Retacrit injection today) 11/07/2022: Hemoglobin 10.1 Iron studies done on 10/17/2022 revealed ferritin of 16 and iron saturation of 9%  Current treatment: Iron infusion has been ordered for today consisted of Retacrit or blood.  He will receive 3 doses of Venofer.  He will come back again in 3 weeks for lab check and transfusions of Retacrit as needed.

## 2022-11-10 ENCOUNTER — Telehealth: Payer: Self-pay | Admitting: *Deleted

## 2022-11-10 NOTE — Telephone Encounter (Signed)
Updated fax for [facility] [phone].  Labs successfully faxed.

## 2022-11-10 NOTE — Telephone Encounter (Signed)
Received signed consent from [facility] requesting recent lab results to be faxed to the office.  RN successfully faxed results to [phone].

## 2022-11-16 ENCOUNTER — Ambulatory Visit: Payer: Medicare PPO | Attending: Internal Medicine | Admitting: Internal Medicine

## 2022-11-16 ENCOUNTER — Encounter: Payer: Self-pay | Admitting: Internal Medicine

## 2022-11-16 VITALS — BP 118/64 | HR 69 | Ht 63.0 in | Wt 110.0 lb

## 2022-11-16 DIAGNOSIS — I5042 Chronic combined systolic (congestive) and diastolic (congestive) heart failure: Secondary | ICD-10-CM | POA: Diagnosis not present

## 2022-11-16 DIAGNOSIS — I480 Paroxysmal atrial fibrillation: Secondary | ICD-10-CM

## 2022-11-16 DIAGNOSIS — I4892 Unspecified atrial flutter: Secondary | ICD-10-CM

## 2022-11-16 DIAGNOSIS — Z950 Presence of cardiac pacemaker: Secondary | ICD-10-CM | POA: Diagnosis not present

## 2022-11-16 LAB — CUP PACEART INCLINIC DEVICE CHECK
Battery Remaining Longevity: 144 mo
Battery Voltage: 3.16 V
Brady Statistic RA Percent Paced: 0 %
Brady Statistic RV Percent Paced: 99.62 %
Date Time Interrogation Session: 20240314123702
Implantable Lead Connection Status: 753985
Implantable Lead Connection Status: 753985
Implantable Lead Implant Date: 20230908
Implantable Lead Implant Date: 20230908
Implantable Lead Location: 753859
Implantable Lead Location: 753860
Implantable Lead Model: 3830
Implantable Lead Model: 5076
Implantable Pulse Generator Implant Date: 20230908
Lead Channel Impedance Value: 266 Ohm
Lead Channel Impedance Value: 323 Ohm
Lead Channel Impedance Value: 456 Ohm
Lead Channel Impedance Value: 456 Ohm
Lead Channel Pacing Threshold Amplitude: 0.625 V
Lead Channel Pacing Threshold Amplitude: 0.875 V
Lead Channel Pacing Threshold Pulse Width: 0.4 ms
Lead Channel Pacing Threshold Pulse Width: 0.4 ms
Lead Channel Sensing Intrinsic Amplitude: 1.25 mV
Lead Channel Sensing Intrinsic Amplitude: 1.5 mV
Lead Channel Sensing Intrinsic Amplitude: 15.375 mV
Lead Channel Setting Pacing Amplitude: 2 V
Lead Channel Setting Pacing Amplitude: 3.25 V
Lead Channel Setting Pacing Pulse Width: 0.4 ms
Lead Channel Setting Sensing Sensitivity: 1.2 mV
Zone Setting Status: 755011

## 2022-11-16 NOTE — Patient Instructions (Signed)
Medication Instructions:  Your physician recommends that you continue on your current medications as directed. Please refer to the Current Medication list given to you today.  *If you need a refill on your cardiac medications before your next appointment, please call your pharmacy*  Lab Work: None ordered.  If you have labs (blood work) drawn today and your tests are completely normal, you will receive your results only by: [place] (if you have MyChart) OR A paper copy in the mail If you have any lab test that is abnormal or we need to change your treatment, we will call you to review the results.  Testing/Procedures: None ordered.  Follow-Up: At [facility], you and your health needs are our priority.  As part of our continuing mission to provide you with exceptional heart care, we have created designated Provider Care Teams.  These Care Teams include your primary Cardiologist (physician) and Advanced Practice Providers (APPs -  Physician Assistants and Nurse Practitioners) who all work together to provide you with the care you need, when you need it.  We recommend signing up for the patient portal called "MyChart".  Sign up information is provided on this After Visit Summary.  MyChart is used to connect with patients for Virtual Visits (Telemedicine).  Patients are able to view lab/test results, encounter notes, upcoming appointments, etc.  Non-urgent messages can be sent to your provider as well.   To learn more about what you can do with MyChart, go to [email or web address].    Your next appointment:   1 year(s)  The format for your next appointment:   In Person  Provider:   [name], MD{or one of the following Advanced Practice Providers on your designated Care Team:   Tommye Standard, [place] Legrand Como "Jonni Sanger" Chalmers Cater, [place]  Remote monitoring is used to monitor your Pacemaker from home. This monitoring reduces the number of office visits required to check your device to  one time per year. It allows [place] to keep an eye on the functioning of your device to ensure it is working properly. You are scheduled for a device check from home on 12/14/22. You may send your transmission at any time that day. If you have a wireless device, the transmission will be sent automatically. After your physician reviews your transmission, you will receive a postcard with your next transmission date.

## 2022-11-16 NOTE — Progress Notes (Signed)
HPI [name] returns today for followup. He is a pleasant 88 yo man with uncontrolled atrial fib and sinus node dysfunction and diastolic heart failure who underwent AV node ablation and PPM insertion about  6 months ago. He has had problems with peripheral edema. Denies chest pain or sob. No syncope. He admits to sodium indiscretion. His peripheral edema is much improved after starting jardiance. No Known Allergies   Current Outpatient Medications  Medication Sig Dispense Refill   apixaban (ELIQUIS) 2.5 MG TABS tablet Take 1 tablet (2.5 mg total) by mouth 2 (two) times daily. 180 tablet 1   cholestyramine (QUESTRAN) 4 g packet Take 4 g by mouth daily.     CREON 24000-76000 units CPEP as directed Orally three times a day     empagliflozin (JARDIANCE) 10 MG TABS tablet Take 1 tablet (10 mg total) by mouth daily. 90 tablet 3   epoetin alfa-epbx (RETACRIT) 28315 UNIT/ML injection Inject 40,000 Units into the skin every 6 (six) weeks. As needed     finasteride (PROSCAR) 5 MG tablet Take 5 mg by mouth daily.     fluticasone (FLONASE) 50 MCG/ACT nasal spray Place 1 spray into both nostrils daily as needed for allergies or rhinitis.     ketotifen (ZADITOR) 0.025 % ophthalmic solution Place 1 drop into both eyes 2 (two) times daily as needed (itchy eyes).     levothyroxine (SYNTHROID) 88 MCG tablet Take 88 mcg by mouth every morning.     oxyCODONE (ROXICODONE) 5 MG immediate release tablet Take 1 tablet (5 mg total) by mouth every 4 (four) hours as needed for severe pain. 10 tablet 0   potassium chloride SA (KLOR-CON M) 20 MEQ tablet Take 1 tablet (20 mEq total) by mouth every other day. 45 tablet 1   predniSONE (DELTASONE) 5 MG tablet Take 5 mg by mouth daily with breakfast. TAKING '15mg'$  QD FOR 3Wks     torsemide (DEMADEX) 20 MG tablet Take 40 mg (2  tablets) by mouth in the AM and 20 mg (1 tablet) by mouth in the PM 270 tablet 1   pantoprazole (PROTONIX) 40 MG tablet Take 1 tablet (40 mg  total) by mouth 2 (two) times daily before a meal for 30 days, THEN 1 tablet (40 mg total) daily. 120 tablet 0   No current facility-administered medications for this visit.     Past Medical History:  Diagnosis Date   Achalasia 06/12/2019   Noted on EGD   Aortic insufficiency 03/24/2019   AI of bioprosthetic AVR severe 4 plus   Atrial flutter (HCC)    Chronic diastolic CHF (congestive heart failure) ([place]) 03/24/2019   Chronic kidney disease, stage 3b (HCC)    Colon polyps    s/p diverticular perforation requiring 2-stage repair   Derangement of right shoulder joint    need replacing has no use of   Diverticulosis    DJD (degenerative joint disease), lumbosacral    Dysphagia    eats soft food   Esophageal stricture    GERD (gastroesophageal reflux disease)    GIB (gastrointestinal bleeding)    Hemolytic anemia ([place])    History of blood transfusion given with 04-15-19 surgery   History of blood transfusion 07/18/2019   History of kidney stones    passed stones   Hypertension    Mitral regurgitation    moderate   Occipital neuralgia    Pacemaker    AVN ablation + MDT PPM 2023   Pancytopenia (  [place])    Postoperative atrial fibrillation ([place]) 05/10/2015   Prosthetic valve dysfunction    Rheumatoid arthritis ([place])    s/o long term steroids  shoulders and hands   Rotator cuff arthropathy    right   S/P aortic valve replacement with bioprosthetic valve 01/16/2008   52m Edwards Magna perimount bovine pericardial tissue valve, model 3000   S/P valve-in-valve TAVR 04/15/2019   26 mm Edwards Sapien 3 Ultra transcatheter heart valve placed via percutaneous right transfemoral approach    SBE (subacute bacterial endocarditis) prophylaxis candidate    for dental procedures   Schatzki's ring 06/12/2019   Narrowing, Noted on EGD   Severe aortic stenosis    S/P prosthetic valve replacement w 25 mm Edwards like science percardial tissue valve,[place] - 01/2008   [place]      ROS:   All systems reviewed and negative except as noted in the HPI.   Past Surgical History:  Procedure Laterality Date   A-FLUTTER ABLATION N/A 10/10/2019   Procedure: A-FLUTTER ABLATION;  Surgeon: TEvans Lance MD;  [facility];  Service: Cardiovascular;  Laterality: N/A;   APPENDECTOMY     AV NODE ABLATION N/A 05/12/2022   Procedure: AV NODE ABLATION;  Surgeon: TEvans Lance MD;  [facility];  Service: Cardiovascular;  Laterality: N/A;   BALLOON DILATION N/A 06/12/2019   Procedure: BALLOON DILATION;  Surgeon: KRonnette Juniper MD;  [facility];  Service: Gastroenterology;  Laterality: N/A;   BOTOX INJECTION N/A 01/22/2018   Procedure: BOTOX INJECTION;  Surgeon: KRonnette Juniper MD;  [facility];  Service: Gastroenterology;  Laterality: N/A;   CARDIAC CATHETERIZATION     09   CARDIAC VALVE REPLACEMENT  01/2008   aortic valve replacement   CARDIOVERSION N/A 09/25/2019   Procedure: CARDIOVERSION;  Surgeon: SJerline Pain MD;  [place] [facility]  Service: Cardiovascular;  Laterality: N/A;   CARDIOVERSION N/A 12/25/2019   Procedure: CARDIOVERSION;  Surgeon: CLelon Perla MD;  [facility]  Service: Cardiovascular;  Laterality: N/A;   CARDIOVERSION N/A 03/08/2022   Procedure: CARDIOVERSION;  Surgeon: PFreada Bergeron MD;  [facility];  Service: Cardiovascular;  Laterality: N/A;   CARDIOVERSION N/A 03/28/2022   Procedure: CARDIOVERSION;  Surgeon: TBerniece Salines DO;  [place]  Service: Cardiovascular;  Laterality: N/A;   CATARACT EXTRACTION W/ INTRAOCULAR LENS  IMPLANT, BILATERAL     COLON RESECTION     diverticulitis    COLONOSCOPY N/A 03/03/2022   Procedure: COLONOSCOPY;  Surgeon: BOtis Brace MD;  [facility];  Service: Gastroenterology;  Laterality: N/A;   CORONARY ANGIOPLASTY     dental implants     permanent   ENTEROSCOPY N/A 03/03/2022   Procedure: ENTEROSCOPY;   Surgeon: BOtis Brace MD;  [facility];  Service: Gastroenterology;  Laterality: N/A;   ESOPHAGEAL MANOMETRY N/A 11/07/2017   Procedure: ESOPHAGEAL MANOMETRY (EM);  Surgeon: KRonnette Juniper MD;  [facility];  Service: Gastroenterology;  Laterality: N/A;   ESOPHAGOGASTRODUODENOSCOPY N/A 02/24/2022   Procedure: ESOPHAGOGASTRODUODENOSCOPY (EGD);  Surgeon: OArta Silence MD;  [facility];  Service: Gastroenterology;  Laterality: N/A;   ESOPHAGOGASTRODUODENOSCOPY (EGD) WITH PROPOFOL N/A 01/22/2018   Procedure: ESOPHAGOGASTRODUODENOSCOPY (EGD) WITH PROPOFOL;  Surgeon: KRonnette Juniper MD;  [facility];  Service: Gastroenterology;  Laterality: N/A;   ESOPHAGOGASTRODUODENOSCOPY (EGD) WITH PROPOFOL N/A 04/30/2019   Procedure: ESOPHAGOGASTRODUODENOSCOPY (EGD) WITH PROPOFOL;  Surgeon: ELaurence Spates MD;  [place] [facility]  Service: Endoscopy;  Laterality:  N/A;   ESOPHAGOGASTRODUODENOSCOPY (EGD) WITH PROPOFOL N/A 06/12/2019   Procedure: ESOPHAGOGASTRODUODENOSCOPY (EGD) WITH PROPOFOL;  Surgeon: Ronnette Juniper, MD;  [facility];  Service: Gastroenterology;  Laterality: N/A;  with botox injection   EXCISIONAL TOTAL SHOULDER ARTHROPLASTY WITH ANTIBIOTIC SPACER Right 12/31/2018   Procedure: EXCISIONAL TOTAL SHOULDER ARTHROPLASTY WITH ANTIBIOTIC SPACER;  Surgeon: Netta Cedars, MD;  Location: [place];  Service: Orthopedics;  Laterality: Right;   GIVENS CAPSULE STUDY N/A 03/01/2022   Procedure: GIVENS CAPSULE STUDY;  Surgeon: Otis Brace, MD;  [facility];  Service: Gastroenterology;  Laterality: N/A;   HEMOSTASIS CLIP PLACEMENT  03/03/2022   Procedure: HEMOSTASIS CLIP PLACEMENT;  Surgeon: Otis Brace, MD;  [facility];  Service: Gastroenterology;;   HERNIA REPAIR     HOT HEMOSTASIS N/A 03/03/2022   Procedure: HOT HEMOSTASIS (ARGON PLASMA COAGULATION/BICAP);  Surgeon: Otis Brace, MD;  [facility];  Service:  Gastroenterology;  Laterality: N/A;  EGD and COLON   INTRAOPERATIVE TRANSTHORACIC ECHOCARDIOGRAM N/A 04/15/2019   Procedure: Intraoperative Transthoracic Echocardiogram;  Surgeon: Sherren Mocha, MD;  Location: [place];  Service: Open Heart Surgery;  Laterality: N/A;   IRRIGATION AND DEBRIDEMENT SHOULDER Right 11/20/2018    IRRIGATION AND DEBRIDEMENT SHOULDER WITH POLY EXCHANGE (Right Shoulder)   IRRIGATION AND DEBRIDEMENT SHOULDER Right 11/20/2018   Procedure: IRRIGATION AND DEBRIDEMENT SHOULDER WITH POLY EXCHANGE;  Surgeon: Netta Cedars, MD;  Location: [place];  Service: Orthopedics;  Laterality: Right;   LUMBAR LAMINECTOMY     x 2   PACEMAKER IMPLANT N/A 05/12/2022   Procedure: PACEMAKER IMPLANT;  Surgeon: Evans Lance, MD;  Location: [place] [facility];  Service: Cardiovascular;  Laterality: N/A;   POLYPECTOMY  03/03/2022   Procedure: POLYPECTOMY;  Surgeon: Otis Brace, MD;  [facility];  Service: Gastroenterology;;   REVERSE SHOULDER ARTHROPLASTY Right 03/01/2018   Procedure: RIGHT REVERSE SHOULDER ARTHROPLASTY;  Surgeon: Netta Cedars, MD;  Location: [place];  Service: Orthopedics;  Laterality: Right;   REVERSE SHOULDER ARTHROPLASTY Right 07/18/2019   Procedure: REVERSE TOTAL SHOULDER ARTHROPLASTY and removal of antiobotic spacer;  Surgeon: Netta Cedars, MD;  [facility];  Service: Orthopedics;  Laterality: Right;  interscalene block   REVERSE SHOULDER ARTHROPLASTY Right 08/06/2019   Procedure: Reduction of dislocated reverse total shoulder and poly exchange;  Surgeon: Netta Cedars, MD;  [facility];  Service: Orthopedics;  Laterality: Right;  need 1 hour   RIGHT/LEFT HEART CATH AND CORONARY ANGIOGRAPHY N/A 04/02/2019   Procedure: RIGHT/LEFT HEART CATH AND CORONARY ANGIOGRAPHY;  Surgeon: Leonie Man, MD;  Location: [place] [facility];  Service: Cardiovascular;  Laterality: N/A;   SAVORY DILATION N/A 01/22/2018   Procedure: SAVORY DILATION;  Surgeon: Ronnette Juniper,  MD;  [facility];  Service: Gastroenterology;  Laterality: N/A;   SAVORY DILATION N/A 04/30/2019   Procedure: SAVORY DILATION;  Surgeon: Laurence Spates, MD;  Location: [place];  Service: Endoscopy;  Laterality: N/A;  With fluro   SHOULDER HEMI-ARTHROPLASTY Right 06/14/2018   Procedure: RIGHT  REVERSE TOTAL SHOULDER OPEN POLY EXCHANGE;  Surgeon: Netta Cedars, MD;  Location: [place];  Service: Orthopedics;  Laterality: Right;   SUBMUCOSAL INJECTION  06/12/2019   Procedure: SUBMUCOSAL INJECTION;  Surgeon: Ronnette Juniper, MD;  [facility];  Service: Gastroenterology;;   TEE WITHOUT CARDIOVERSION N/A 01/29/2014   Procedure: TRANSESOPHAGEAL ECHOCARDIOGRAM (TEE);  Surgeon: Sueanne Margarita, MD;  Location[facility];  Service: Cardiovascular;  Laterality: N/A;   TEE WITHOUT CARDIOVERSION N/A 04/02/2019   Procedure: TRANSESOPHAGEAL ECHOCARDIOGRAM (  TEE);  Surgeon: Josue Hector, MD;  [facility];  Service: Cardiovascular;  Laterality: N/A;   TEE WITHOUT CARDIOVERSION N/A 09/25/2019   Procedure: TRANSESOPHAGEAL ECHOCARDIOGRAM (TEE);  Surgeon: Jerline Pain, MD;  Location: [facility];  Service: Cardiovascular;  Laterality: N/A;   TEE WITHOUT CARDIOVERSION N/A 03/08/2022   Procedure: TRANSESOPHAGEAL ECHOCARDIOGRAM (TEE);  Surgeon: [name], MD;  Location[facility];  Service: Cardiovascular;  Laterality: N/A;   TONSILLECTOMY     TRANSCATHETER AORTIC VALVE REPLACEMENT, TRANSFEMORAL  04/15/2019   TRANSCATHETER AORTIC VALVE REPLACEMENT, TRANSFEMORAL N/A 04/15/2019   Procedure: TRANSCATHETER AORTIC VALVE REPLACEMENT, TRANSFEMORAL with POST BALLOON DILATION;  Surgeon: Sherren Mocha, MD;  Location: [place];  Service: Open Heart Surgery;  Laterality: N/A;     Family History  Problem Relation Age of Onset   Other Mother        NO HEALTH PROBLEMS   Heart disease Father    Arthritis Father    Heart Problems Sister        RELATED TO A MVA   Suicidality Brother     Other Sister        3 SISTERS IN GOOD HEALTH   Other Brother        2 BROTHERS IN GOOD HEALTH   Other Daughter        2 IN GOOD HEALTH     Social History   Socioeconomic History   Marital status: Widowed    Spouse name: Not on file   Number of children: 2   Years of education: Not on file   Highest education level: Not on file  Occupational History   Occupation: Retired Market researcher at [employer]   Smoking status: Former    Packs/day: 0.50    Years: 10.00    Additional pack years: 0.00    Total pack years: 5.00    Types: Cigarettes    Start date: 01/16/1974    Quit date: 09/05/1983    Years since quitting: 39.2   Smokeless tobacco: Former    Types: Nurse, children's Use: Never used  Substance and Sexual Activity   Alcohol use: Not Currently    Alcohol/week: 0.0 standard drinks of alcohol   Drug use: No   Sexual activity: Not on file  Other Topics Concern   Not on file  Social History Narrative   Not on file   Social Determinants of Health   Financial Resource Strain: Low Risk  (06/30/2022)   Overall Financial Resource Strain (CARDIA)    Difficulty of Paying Living Expenses: Not hard at all  Food Insecurity: No Food Insecurity (06/30/2022)   Hunger Vital Sign    Worried About Running Out of Food in the Last Year: Never true    [place] in the Last Year: Never true  Transportation Needs: No Transportation Needs (06/30/2022)   PRAPARE - Hydrologist (Medical): No    Lack of Transportation (Non-Medical): No  Physical Activity: Not on file  Stress: Not on file  Social Connections: Not on file  Intimate Partner Violence: Not on file     BP 118/64   Pulse 69   Ht '5\' 3"'$  (1.6 m)   Wt 110 lb (49.9 kg)   SpO2 100%   BMI 19.49 kg/m   Physical Exam:  Well appearing NAD HEENT: Unremarkable Neck:  No JVD, no thyromegally Lymphatics:  No adenopathy Back:  No CVA tenderness Lungs:  Clear  with no wheezes HEART:   Regular rate rhythm, no murmurs, no rubs, no clicks Abd:  soft, positive bowel sounds, no organomegally, no rebound, no guarding Ext:  2 plus pulses, 1+ edema, no cyanosis, no clubbing Skin:  No rashes no nodules Neuro:  CN II through XII intact, motor grossly intact  EKG - atrial fib with ventricular pacing  DEVICE  Normal device function.  See PaceArt for details.   Assess/Plan:  Uncontrolled atrial fib and flutter - his rates are now controlled after AV node ablation. Chronic diastolic heart failure R>L. He is improved with his procedure and initiation of jardiance PPM -his Medtronic DDD  PM is working normally.  HTN - his bp is controlled.   Carleene Overlie Clyde Zarrella,MD

## 2022-11-17 ENCOUNTER — Inpatient Hospital Stay: Payer: Medicare PPO

## 2022-11-17 VITALS — BP 141/73 | HR 65 | Temp 98.0°F | Resp 16

## 2022-11-17 DIAGNOSIS — D563 Thalassemia minor: Secondary | ICD-10-CM | POA: Diagnosis not present

## 2022-11-17 DIAGNOSIS — D638 Anemia in other chronic diseases classified elsewhere: Secondary | ICD-10-CM

## 2022-11-17 MED ORDER — SODIUM CHLORIDE 0.9 % IV SOLN
300.0000 mg | Freq: Once | INTRAVENOUS | Status: AC
  Administered 2022-11-17: 300 mg via INTRAVENOUS
  Filled 2022-11-17: qty 300

## 2022-11-17 MED ORDER — SODIUM CHLORIDE 0.9 % IV SOLN: Freq: Once | INTRAVENOUS | Status: AC

## 2022-11-17 NOTE — Patient Instructions (Signed)

## 2022-11-17 NOTE — Progress Notes (Signed)
Patient tolerated his iron well- no issues last time and he's declining a 30 minute observation. VSS- BP (!) 141/73 (BP Location: Right Arm, Patient Position: Sitting)   Pulse 65   Temp 98 F (36.7 C) (Oral)   Resp 16   SpO2 100%   Ambulatory to the lobby. Daughter to take home.

## 2022-11-24 ENCOUNTER — Inpatient Hospital Stay: Payer: Medicare PPO

## 2022-11-24 VITALS — BP 133/64 | HR 60 | Temp 98.0°F | Resp 16

## 2022-11-24 DIAGNOSIS — D563 Thalassemia minor: Secondary | ICD-10-CM | POA: Diagnosis not present

## 2022-11-24 DIAGNOSIS — D638 Anemia in other chronic diseases classified elsewhere: Secondary | ICD-10-CM

## 2022-11-24 MED ORDER — SODIUM CHLORIDE 0.9 % IV SOLN: Freq: Once | INTRAVENOUS | Status: AC

## 2022-11-24 MED ORDER — SODIUM CHLORIDE 0.9 % IV SOLN
300.0000 mg | Freq: Once | INTRAVENOUS | Status: AC
  Administered 2022-11-24: 300 mg via INTRAVENOUS
  Filled 2022-11-24: qty 300

## 2022-11-24 NOTE — Progress Notes (Signed)
Patient declined to stay for 30 minute post-observation period following Venofer 300 mg infusion.  Patient's vital signs retaken prior to discharge and remained within normal parameters.  Patient discharged in stable condition.   

## 2022-11-24 NOTE — Patient Instructions (Signed)

## 2022-11-29 NOTE — Progress Notes (Signed)
Patient Care Team: Corliss Blacker, MD as PCP - General (Internal Medicine) Sueanne Margarita, MD as PCP - Cardiology (Cardiology) Evans Lance, MD as PCP - Electrophysiology (Cardiology) Sueanne Margarita, MD as Consulting Physician (Cardiology) Nicholas Lose, MD as Consulting Physician (Hematology and Oncology)  DIAGNOSIS: No diagnosis found.  SUMMARY OF ONCOLOGIC HISTORY: Oncology History   No history exists.    CHIEF COMPLIANT: Follow up anemia    INTERVAL HISTORY: Joshua Hudson is a 88 year old with above-mentioned history of chronic anemia request blood transfusions periodically. He presents to the clinic for a follow-up.    ALLERGIES:  has No Known Allergies.  MEDICATIONS:  Current Outpatient Medications  Medication Sig Dispense Refill   apixaban (ELIQUIS) 2.5 MG TABS tablet Take 1 tablet (2.5 mg total) by mouth 2 (two) times daily. 180 tablet 1   cholestyramine (QUESTRAN) 4 g packet Take 4 g by mouth daily.     CREON 24000-76000 units CPEP as directed Orally three times a day     empagliflozin (JARDIANCE) 10 MG TABS tablet Take 1 tablet (10 mg total) by mouth daily. 90 tablet 3   epoetin alfa-epbx (RETACRIT) 03474 UNIT/ML injection Inject 40,000 Units into the skin every 6 (six) weeks. As needed     finasteride (PROSCAR) 5 MG tablet Take 5 mg by mouth daily.     fluticasone (FLONASE) 50 MCG/ACT nasal spray Place 1 spray into both nostrils daily as needed for allergies or rhinitis.     ketotifen (ZADITOR) 0.025 % ophthalmic solution Place 1 drop into both eyes 2 (two) times daily as needed (itchy eyes).     levothyroxine (SYNTHROID) 88 MCG tablet Take 88 mcg by mouth every morning.     oxyCODONE (ROXICODONE) 5 MG immediate release tablet Take 1 tablet (5 mg total) by mouth every 4 (four) hours as needed for severe pain. 10 tablet 0   pantoprazole (PROTONIX) 40 MG tablet Take 1 tablet (40 mg total) by mouth 2 (two) times daily before a meal for 30 days, THEN 1 tablet  (40 mg total) daily. 120 tablet 0   potassium chloride SA (KLOR-CON M) 20 MEQ tablet Take 1 tablet (20 mEq total) by mouth every other day. 45 tablet 1   predniSONE (DELTASONE) 5 MG tablet Take 5 mg by mouth daily with breakfast. TAKING 15mg  QD FOR 3Wks     torsemide (DEMADEX) 20 MG tablet Take 40 mg (2  tablets) by mouth in the AM and 20 mg (1 tablet) by mouth in the PM 270 tablet 1   No current facility-administered medications for this visit.    PHYSICAL EXAMINATION: ECOG PERFORMANCE STATUS: {CHL ONC ECOG PS:[id number]}  There were no vitals filed for this visit. There were no vitals filed for this visit.  BREAST:*** No palpable masses or nodules in either right or left breasts. No palpable axillary supraclavicular or infraclavicular adenopathy no breast tenderness or nipple discharge. (exam performed in the presence of a chaperone)  LABORATORY DATA:  I have reviewed the data as listed    Latest Ref Rng & Units 11/07/2022    7:21 AM 10/17/2022    7:48 AM 09/26/2022    7:33 AM  CMP  Glucose 70 - 99 mg/dL 89  93  89   BUN 8 - 23 mg/dL 25  30  32   Creatinine 0.61 - 1.24 mg/dL 1.67  1.91  1.77   Sodium 135 - 145 mmol/L 142  142  141   Potassium 3.5 -  5.1 mmol/L 3.4  4.6  3.8   Chloride 98 - 111 mmol/L 102  107  104   CO2 22 - 32 mmol/L 32  29  29   Calcium 8.9 - 10.3 mg/dL 9.6  9.4  8.9   Total Protein 6.5 - 8.1 g/dL 6.6  6.1  5.7   Total Bilirubin 0.3 - 1.2 mg/dL 0.4  0.4  0.4   Alkaline Phos 38 - 126 U/L 54  55  53   AST 15 - 41 U/L 15  18  14    ALT 0 - 44 U/L 8  12  8      Lab Results  Component Value Date   WBC 10.2 11/07/2022   HGB 10.1 (L) 11/07/2022   HCT 32.9 (L) 11/07/2022   MCV 73.4 (L) 11/07/2022   PLT 179 11/07/2022   NEUTROABS 7.9 (H) 11/07/2022    ASSESSMENT & PLAN:  No problem-specific Assessment & Plan notes found for this encounter.    No orders of the defined types were placed in this encounter.  The patient has a good understanding of the overall  plan. he agrees with it. he will call with any problems that may develop before the next visit here. Total time spent: 30 mins including face to face time and time spent for planning, charting and co-ordination of care   Suzzette Righter, [place] 11/29/22    I Gardiner Coins am acting as a Education administrator for [employer]  ***

## 2022-12-05 NOTE — Assessment & Plan Note (Addendum)
Hemoglobin electrophoresis: Beta thalassemia minor.  This is the cause of microcytosis.  It is not iron deficiency related.   Possible differential: Anemia of chronic disease versus myelodysplastic syndrome versus methotrexate related toxicity.     Hospitalization 09/22/2019-09/25/2019: A. fib with RVR status post cardioversion, currently on Eliquis Hospitalization 02/28/2022-03/11/2022: Atrial fibrillation: Cardioversion planned for 03/22/2022. 05/12/22: Ablation and Pacemaker Implantation    Lab review: 09/25/2019: WBC 7.2, hemoglobin 9.6, platelets 145 04/14/22: Hb 7.8, Platelets 91 (PRBC) 06/14/22: Hb 10.4, Pl 151, Cr 1.66, Iron sat: 16%, Ferritin 287 07/05/22: Hemoglobin 9.7 08/14/22: Hemoglobin 7.4 09/26/2022: Hemoglobin 9.1 (Retacrit injection today) 11/07/2022: Hemoglobin 10.1 12/06/2022: Hemoglobin 9.5 (Retacrit will be given)  IV iron: March 2024   Current treatment: Retacrit   He will come back again in 3 weeks for lab check and transfusions or Retacrit as needed.

## 2022-12-06 ENCOUNTER — Inpatient Hospital Stay (HOSPITAL_BASED_OUTPATIENT_CLINIC_OR_DEPARTMENT_OTHER): Payer: Medicare PPO | Admitting: Hematology and Oncology

## 2022-12-06 ENCOUNTER — Inpatient Hospital Stay: Payer: Medicare PPO | Attending: Hematology and Oncology

## 2022-12-06 ENCOUNTER — Inpatient Hospital Stay: Payer: Medicare PPO

## 2022-12-06 VITALS — BP 124/66 | HR 84 | Temp 97.5°F | Resp 18 | Ht 63.0 in | Wt 110.5 lb

## 2022-12-06 DIAGNOSIS — I4891 Unspecified atrial fibrillation: Secondary | ICD-10-CM | POA: Insufficient documentation

## 2022-12-06 DIAGNOSIS — Z7901 Long term (current) use of anticoagulants: Secondary | ICD-10-CM | POA: Diagnosis not present

## 2022-12-06 DIAGNOSIS — D563 Thalassemia minor: Secondary | ICD-10-CM | POA: Diagnosis not present

## 2022-12-06 DIAGNOSIS — D638 Anemia in other chronic diseases classified elsewhere: Secondary | ICD-10-CM

## 2022-12-06 LAB — CBC WITH DIFFERENTIAL (CANCER CENTER ONLY)
Abs Immature Granulocytes: 0.05 10*3/uL (ref 0.00–0.07)
Basophils Absolute: 0 10*3/uL (ref 0.0–0.1)
Basophils Relative: 1 %
Eosinophils Absolute: 0.2 10*3/uL (ref 0.0–0.5)
Eosinophils Relative: 3 %
HCT: 30.7 % — ABNORMAL LOW (ref 39.0–52.0)
Hemoglobin: 9.5 g/dL — ABNORMAL LOW (ref 13.0–17.0)
Immature Granulocytes: 1 %
Lymphocytes Relative: 8 %
Lymphs Abs: 0.7 10*3/uL (ref 0.7–4.0)
MCH: 22.1 pg — ABNORMAL LOW (ref 26.0–34.0)
MCHC: 30.9 g/dL (ref 30.0–36.0)
MCV: 71.4 fL — ABNORMAL LOW (ref 80.0–100.0)
Monocytes Absolute: 0.6 10*3/uL (ref 0.1–1.0)
Monocytes Relative: 7 %
Neutro Abs: 7 10*3/uL (ref 1.7–7.7)
Neutrophils Relative %: 80 %
Platelet Count: 156 10*3/uL (ref 150–400)
RBC: 4.3 MIL/uL (ref 4.22–5.81)
RDW: 18.8 % — ABNORMAL HIGH (ref 11.5–15.5)
WBC Count: 8.5 10*3/uL (ref 4.0–10.5)
nRBC: 0.2 % (ref 0.0–0.2)

## 2022-12-06 LAB — CMP (CANCER CENTER ONLY)
ALT: 9 U/L (ref 0–44)
AST: 16 U/L (ref 15–41)
Albumin: 3.9 g/dL (ref 3.5–5.0)
Alkaline Phosphatase: 56 U/L (ref 38–126)
Anion gap: 6 (ref 5–15)
BUN: 33 mg/dL — ABNORMAL HIGH (ref 8–23)
CO2: 30 mmol/L (ref 22–32)
Calcium: 9.5 mg/dL (ref 8.9–10.3)
Chloride: 104 mmol/L (ref 98–111)
Creatinine: 2.24 mg/dL — ABNORMAL HIGH (ref 0.61–1.24)
GFR, Estimated: 28 mL/min — ABNORMAL LOW (ref >60)
Glucose, Bld: 110 mg/dL — ABNORMAL HIGH (ref 70–99)
Potassium: 3.9 mmol/L (ref 3.5–5.1)
Sodium: 140 mmol/L (ref 135–145)
Total Bilirubin: 0.5 mg/dL (ref 0.3–1.2)
Total Protein: 6.1 g/dL — ABNORMAL LOW (ref 6.5–8.1)

## 2022-12-06 LAB — IRON AND IRON BINDING CAPACITY (CC-WL,HP ONLY)
Iron: 88 ug/dL (ref 45–182)
Saturation Ratios: 35 % (ref 17.9–39.5)
TIBC: 251 ug/dL (ref 250–450)
UIBC: 163 ug/dL (ref 117–376)

## 2022-12-06 LAB — FERRITIN: Ferritin: 164 ng/mL (ref 24–336)

## 2022-12-06 LAB — SAMPLE TO BLOOD BANK

## 2022-12-06 MED ORDER — EPOETIN ALFA-EPBX 40000 UNIT/ML IJ SOLN
40000.0000 [IU] | Freq: Once | INTRAMUSCULAR | Status: AC
  Administered 2022-12-06: 40000 [IU] via SUBCUTANEOUS
  Filled 2022-12-06: qty 1

## 2022-12-07 ENCOUNTER — Telehealth: Payer: Self-pay | Admitting: Hematology and Oncology

## 2022-12-07 NOTE — Telephone Encounter (Signed)
Scheduled appointments per 4/3 los. Talked to the patients daughter and she is aware of the made appointments for the patient.

## 2022-12-14 ENCOUNTER — Ambulatory Visit (INDEPENDENT_AMBULATORY_CARE_PROVIDER_SITE_OTHER): Payer: Medicare PPO

## 2022-12-14 DIAGNOSIS — I480 Paroxysmal atrial fibrillation: Secondary | ICD-10-CM | POA: Diagnosis not present

## 2022-12-14 LAB — CUP PACEART REMOTE DEVICE CHECK
Battery Remaining Longevity: 144 mo
Battery Voltage: 3.15 V
Brady Statistic RA Percent Paced: 0.18 %
Brady Statistic RV Percent Paced: 99.83 %
Date Time Interrogation Session: 20240410205248
Implantable Lead Connection Status: 753985
Implantable Lead Connection Status: 753985
Implantable Lead Implant Date: 20230908
Implantable Lead Implant Date: 20230908
Implantable Lead Location: 753859
Implantable Lead Location: 753860
Implantable Lead Model: 3830
Implantable Lead Model: 5076
Implantable Pulse Generator Implant Date: 20230908
Lead Channel Impedance Value: 247 Ohm
Lead Channel Impedance Value: 304 Ohm
Lead Channel Impedance Value: 437 Ohm
Lead Channel Impedance Value: 456 Ohm
Lead Channel Pacing Threshold Amplitude: 0.625 V
Lead Channel Pacing Threshold Amplitude: 0.75 V
Lead Channel Pacing Threshold Pulse Width: 0.4 ms
Lead Channel Pacing Threshold Pulse Width: 0.4 ms
Lead Channel Sensing Intrinsic Amplitude: 1.5 mV
Lead Channel Sensing Intrinsic Amplitude: 1.5 mV
Lead Channel Sensing Intrinsic Amplitude: 15.625 mV
Lead Channel Sensing Intrinsic Amplitude: 15.625 mV
Lead Channel Setting Pacing Amplitude: 2 V
Lead Channel Setting Pacing Amplitude: 3.25 V
Lead Channel Setting Pacing Pulse Width: 0.4 ms
Lead Channel Setting Sensing Sensitivity: 1.2 mV
Zone Setting Status: 755011

## 2022-12-27 ENCOUNTER — Inpatient Hospital Stay: Payer: Medicare PPO

## 2022-12-27 VITALS — BP 137/86 | HR 84 | Temp 97.8°F | Resp 18

## 2022-12-27 DIAGNOSIS — D638 Anemia in other chronic diseases classified elsewhere: Secondary | ICD-10-CM | POA: Diagnosis not present

## 2022-12-27 DIAGNOSIS — Z7901 Long term (current) use of anticoagulants: Secondary | ICD-10-CM | POA: Diagnosis not present

## 2022-12-27 DIAGNOSIS — D563 Thalassemia minor: Secondary | ICD-10-CM | POA: Diagnosis not present

## 2022-12-27 DIAGNOSIS — I4891 Unspecified atrial fibrillation: Secondary | ICD-10-CM | POA: Diagnosis not present

## 2022-12-27 LAB — CBC WITH DIFFERENTIAL (CANCER CENTER ONLY)
Abs Immature Granulocytes: 0.06 10*3/uL (ref 0.00–0.07)
Basophils Absolute: 0.1 10*3/uL (ref 0.0–0.1)
Basophils Relative: 1 %
Eosinophils Absolute: 0.2 10*3/uL (ref 0.0–0.5)
Eosinophils Relative: 2 %
HCT: 33.7 % — ABNORMAL LOW (ref 39.0–52.0)
Hemoglobin: 10.5 g/dL — ABNORMAL LOW (ref 13.0–17.0)
Immature Granulocytes: 1 %
Lymphocytes Relative: 9 %
Lymphs Abs: 0.9 10*3/uL (ref 0.7–4.0)
MCH: 21.8 pg — ABNORMAL LOW (ref 26.0–34.0)
MCHC: 31.2 g/dL (ref 30.0–36.0)
MCV: 70.1 fL — ABNORMAL LOW (ref 80.0–100.0)
Monocytes Absolute: 0.8 10*3/uL (ref 0.1–1.0)
Monocytes Relative: 8 %
Neutro Abs: 7.7 10*3/uL (ref 1.7–7.7)
Neutrophils Relative %: 79 %
Platelet Count: 159 10*3/uL (ref 150–400)
RBC: 4.81 MIL/uL (ref 4.22–5.81)
RDW: 17.6 % — ABNORMAL HIGH (ref 11.5–15.5)
WBC Count: 9.6 10*3/uL (ref 4.0–10.5)
nRBC: 0.2 % (ref 0.0–0.2)

## 2022-12-27 LAB — CMP (CANCER CENTER ONLY)
ALT: 8 U/L (ref 0–44)
AST: 16 U/L (ref 15–41)
Albumin: 4.1 g/dL (ref 3.5–5.0)
Alkaline Phosphatase: 59 U/L (ref 38–126)
Anion gap: 9 (ref 5–15)
BUN: 37 mg/dL — ABNORMAL HIGH (ref 8–23)
CO2: 30 mmol/L (ref 22–32)
Calcium: 9.5 mg/dL (ref 8.9–10.3)
Chloride: 103 mmol/L (ref 98–111)
Creatinine: 1.72 mg/dL — ABNORMAL HIGH (ref 0.61–1.24)
GFR, Estimated: 38 mL/min — ABNORMAL LOW (ref >60)
Glucose, Bld: 97 mg/dL (ref 70–99)
Potassium: 3.5 mmol/L (ref 3.5–5.1)
Sodium: 142 mmol/L (ref 135–145)
Total Bilirubin: 0.4 mg/dL (ref 0.3–1.2)
Total Protein: 6.4 g/dL — ABNORMAL LOW (ref 6.5–8.1)

## 2022-12-27 LAB — IRON AND IRON BINDING CAPACITY (CC-WL,HP ONLY)
Iron: 81 ug/dL (ref 45–182)
Saturation Ratios: 30 % (ref 17.9–39.5)
TIBC: 273 ug/dL (ref 250–450)
UIBC: 192 ug/dL (ref 117–376)

## 2022-12-27 LAB — FERRITIN: Ferritin: 94 ng/mL (ref 24–336)

## 2022-12-27 MED ORDER — EPOETIN ALFA-EPBX 40000 UNIT/ML IJ SOLN
40000.0000 [IU] | Freq: Once | INTRAMUSCULAR | Status: AC
  Administered 2022-12-27: 40000 [IU] via SUBCUTANEOUS
  Filled 2022-12-27: qty 1

## 2022-12-27 NOTE — Progress Notes (Signed)
Per Dr. Pamelia Hoit, OK to administer Retacrit today with Hgb 10.5.  No blood product transfusion.

## 2022-12-27 NOTE — Patient Instructions (Signed)

## 2022-12-27 NOTE — Progress Notes (Signed)
Patient reports fall last Tuesday. Scraped forehead on cushion of couch, but denies any loss of consciousness, headache, dizziness afterwards. Has a small bandage on forehead, scrape clean, no signs of infection. Claims he lost his balance, did not fall due to dizziness nor syncope.  Dr. Pamelia Hoit aware.

## 2023-01-01 DIAGNOSIS — S0501XA Injury of conjunctiva and corneal abrasion without foreign body, right eye, initial encounter: Secondary | ICD-10-CM | POA: Diagnosis not present

## 2023-01-01 DIAGNOSIS — H53141 Visual discomfort, right eye: Secondary | ICD-10-CM | POA: Diagnosis not present

## 2023-01-01 DIAGNOSIS — H11431 Conjunctival hyperemia, right eye: Secondary | ICD-10-CM | POA: Diagnosis not present

## 2023-01-01 DIAGNOSIS — H5711 Ocular pain, right eye: Secondary | ICD-10-CM | POA: Diagnosis not present

## 2023-01-01 DIAGNOSIS — H353211 Exudative age-related macular degeneration, right eye, with active choroidal neovascularization: Secondary | ICD-10-CM | POA: Diagnosis not present

## 2023-01-06 NOTE — Progress Notes (Signed)
Patient Care Team: Joya Martyr, MD as PCP - General (Internal Medicine) Quintella Reichert, MD as PCP - Cardiology (Cardiology) Marinus Maw, MD as PCP - Electrophysiology (Cardiology) Quintella Reichert, MD as Consulting Physician (Cardiology) Serena Croissant, MD as Consulting Physician (Hematology and Oncology)  DIAGNOSIS: No diagnosis found.  SUMMARY OF ONCOLOGIC HISTORY: Oncology History   No history exists.    CHIEF COMPLIANT: Follow up anemia    INTERVAL HISTORY: Joshua Hudson is a  88 year old with above-mentioned history of chronic anemia request blood transfusions periodically. He presents to the clinic for a follow-up.     ALLERGIES:  has No Known Allergies.  MEDICATIONS:  Current Outpatient Medications  Medication Sig Dispense Refill   apixaban (ELIQUIS) 2.5 MG TABS tablet Take 1 tablet (2.5 mg total) by mouth 2 (two) times daily. 180 tablet 1   cholestyramine (QUESTRAN) 4 g packet Take 4 g by mouth daily.     CREON 24000-76000 units CPEP as directed Orally three times a day     empagliflozin (JARDIANCE) 10 MG TABS tablet Take 1 tablet (10 mg total) by mouth daily. 90 tablet 3   epoetin alfa-epbx (RETACRIT) 16109 UNIT/ML injection Inject 40,000 Units into the skin every 6 (six) weeks. As needed     finasteride (PROSCAR) 5 MG tablet Take 5 mg by mouth daily.     fluticasone (FLONASE) 50 MCG/ACT nasal spray Place 1 spray into both nostrils daily as needed for allergies or rhinitis.     ketotifen (ZADITOR) 0.025 % ophthalmic solution Place 1 drop into both eyes 2 (two) times daily as needed (itchy eyes).     levothyroxine (SYNTHROID) 88 MCG tablet Take 88 mcg by mouth every morning.     oxyCODONE (ROXICODONE) 5 MG immediate release tablet Take 1 tablet (5 mg total) by mouth every 4 (four) hours as needed for severe pain. 10 tablet 0   pantoprazole (PROTONIX) 40 MG tablet Take 1 tablet (40 mg total) by mouth 2 (two) times daily before a meal for 30 days, THEN 1  tablet (40 mg total) daily. 120 tablet 0   potassium chloride SA (KLOR-CON M) 20 MEQ tablet Take 1 tablet (20 mEq total) by mouth every other day. 45 tablet 1   predniSONE (DELTASONE) 5 MG tablet Take 5 mg by mouth daily with breakfast. TAKING 15mg  QD FOR 3Wks     torsemide (DEMADEX) 20 MG tablet Take 40 mg (2  tablets) by mouth in the AM and 20 mg (1 tablet) by mouth in the PM 270 tablet 1   No current facility-administered medications for this visit.    PHYSICAL EXAMINATION: ECOG PERFORMANCE STATUS: {CHL ONC ECOG [phone]}  There were no vitals filed for this visit. There were no vitals filed for this visit.  BREAST:*** No palpable masses or nodules in either right or left breasts. No palpable axillary supraclavicular or infraclavicular adenopathy no breast tenderness or nipple discharge. (exam performed in the presence of a chaperone)  LABORATORY DATA:  I have reviewed the data as listed    Latest Ref Rng & Units 12/27/2022    7:37 AM 12/06/2022    7:23 AM 11/07/2022    7:21 AM  CMP  Glucose 70 - 99 mg/dL 97  604  89   BUN 8 - 23 mg/dL 37  33  25   Creatinine 0.61 - 1.24 mg/dL 5.40  9.81  1.91   Sodium 135 - 145 mmol/L 142  140  142  Potassium 3.5 - 5.1 mmol/L 3.5  3.9  3.4   Chloride 98 - 111 mmol/L 103  104  102   CO2 22 - 32 mmol/L 30  30  32   Calcium 8.9 - 10.3 mg/dL 9.5  9.5  9.6   Total Protein 6.5 - 8.1 g/dL 6.4  6.1  6.6   Total Bilirubin 0.3 - 1.2 mg/dL 0.4  0.5  0.4   Alkaline Phos 38 - 126 U/L 59  56  54   AST 15 - 41 U/L 16  16  15    ALT 0 - 44 U/L 8  9  8      Lab Results  Component Value Date   WBC 9.6 12/27/2022   HGB 10.5 (L) 12/27/2022   HCT 33.7 (L) 12/27/2022   MCV 70.1 (L) 12/27/2022   PLT 159 12/27/2022   NEUTROABS 7.7 12/27/2022    ASSESSMENT & PLAN:  No problem-specific Assessment & Plan notes found for this encounter.    No orders of the defined types were placed in this encounter.  The patient has a good understanding of the overall  plan. he agrees with it. he will call with any problems that may develop before the next visit here. Total time spent: 30 mins including face to face time and time spent for planning, charting and co-ordination of care   Sherlyn Lick, CMA 01/06/23    I Janan Ridge am acting as a Neurosurgeon for [employer]  ***

## 2023-01-15 ENCOUNTER — Other Ambulatory Visit: Payer: Self-pay | Admitting: Pharmacist

## 2023-01-15 DIAGNOSIS — I480 Paroxysmal atrial fibrillation: Secondary | ICD-10-CM

## 2023-01-15 MED ORDER — APIXABAN 2.5 MG PO TABS
2.5000 mg | ORAL_TABLET | Freq: Two times a day (BID) | ORAL | 1 refills | Status: DC
Start: 2023-01-15 — End: 2023-04-23

## 2023-01-15 NOTE — Telephone Encounter (Signed)
Prescription refill request for Eliquis received. Indication: a fib Last office visit: 11/16/22 Scr: 1.72 12/27/22 epic Age: 88 Weight: 50 kg

## 2023-01-17 ENCOUNTER — Telehealth: Payer: Self-pay

## 2023-01-17 ENCOUNTER — Other Ambulatory Visit (HOSPITAL_COMMUNITY): Payer: Self-pay

## 2023-01-17 ENCOUNTER — Encounter: Payer: Self-pay | Admitting: Hematology and Oncology

## 2023-01-17 NOTE — Telephone Encounter (Signed)
Pharmacy Patient Advocate Encounter  Prior Authorization for ELIQUIS 2.5MG  has been approved by [employer] (ins).    KEY # [id number]  Effective dates: 1.1.24 through 12.31.24

## 2023-01-17 NOTE — Telephone Encounter (Signed)
Pharmacy Patient Advocate Encounter   Received notification from [facility] that prior authorization for ELIQUIS 2.5MG   is required/requested.   PA submitted on 5.15.24 to (ins) [employer] via CoverMyMeds  Key or (Medicaid) confirmation # [id number] Status is pending

## 2023-01-18 ENCOUNTER — Inpatient Hospital Stay: Payer: Medicare PPO

## 2023-01-18 ENCOUNTER — Inpatient Hospital Stay: Payer: Medicare PPO | Attending: Hematology and Oncology | Admitting: Hematology and Oncology

## 2023-01-18 VITALS — BP 134/71 | HR 82 | Temp 97.8°F | Wt 110.8 lb

## 2023-01-18 DIAGNOSIS — I4891 Unspecified atrial fibrillation: Secondary | ICD-10-CM | POA: Diagnosis not present

## 2023-01-18 DIAGNOSIS — Z7901 Long term (current) use of anticoagulants: Secondary | ICD-10-CM | POA: Diagnosis not present

## 2023-01-18 DIAGNOSIS — D563 Thalassemia minor: Secondary | ICD-10-CM | POA: Diagnosis not present

## 2023-01-18 DIAGNOSIS — D638 Anemia in other chronic diseases classified elsewhere: Secondary | ICD-10-CM | POA: Insufficient documentation

## 2023-01-18 DIAGNOSIS — D598 Other acquired hemolytic anemias: Secondary | ICD-10-CM

## 2023-01-18 LAB — CBC WITH DIFFERENTIAL (CANCER CENTER ONLY)
Abs Immature Granulocytes: 0.06 10*3/uL (ref 0.00–0.07)
Basophils Absolute: 0 10*3/uL (ref 0.0–0.1)
Basophils Relative: 0 %
Eosinophils Absolute: 0.2 10*3/uL (ref 0.0–0.5)
Eosinophils Relative: 2 %
HCT: 34.2 % — ABNORMAL LOW (ref 39.0–52.0)
Hemoglobin: 10.6 g/dL — ABNORMAL LOW (ref 13.0–17.0)
Immature Granulocytes: 1 %
Lymphocytes Relative: 8 %
Lymphs Abs: 0.8 10*3/uL (ref 0.7–4.0)
MCH: 21.5 pg — ABNORMAL LOW (ref 26.0–34.0)
MCHC: 31 g/dL (ref 30.0–36.0)
MCV: 69.4 fL — ABNORMAL LOW (ref 80.0–100.0)
Monocytes Absolute: 0.6 10*3/uL (ref 0.1–1.0)
Monocytes Relative: 6 %
Neutro Abs: 8.1 10*3/uL — ABNORMAL HIGH (ref 1.7–7.7)
Neutrophils Relative %: 83 %
Platelet Count: 130 10*3/uL — ABNORMAL LOW (ref 150–400)
RBC: 4.93 MIL/uL (ref 4.22–5.81)
RDW: 16.8 % — ABNORMAL HIGH (ref 11.5–15.5)
WBC Count: 9.7 10*3/uL (ref 4.0–10.5)
nRBC: 0 % (ref 0.0–0.2)

## 2023-01-18 LAB — IRON AND IRON BINDING CAPACITY (CC-WL,HP ONLY)
Iron: 97 ug/dL (ref 45–182)
Saturation Ratios: 39 % (ref 17.9–39.5)
TIBC: 251 ug/dL (ref 250–450)
UIBC: 154 ug/dL (ref 117–376)

## 2023-01-18 LAB — SAMPLE TO BLOOD BANK

## 2023-01-18 LAB — FERRITIN: Ferritin: 81 ng/mL (ref 24–336)

## 2023-01-18 LAB — VITAMIN B12: Vitamin B-12: 180 pg/mL (ref 180–914)

## 2023-01-18 NOTE — Progress Notes (Signed)
Remote pacemaker transmission.   

## 2023-01-18 NOTE — Assessment & Plan Note (Addendum)
Hemoglobin electrophoresis: Beta thalassemia minor.  This is the cause of microcytosis.  It is not iron deficiency related.   Possible differential: Anemia of chronic disease versus myelodysplastic syndrome versus methotrexate related toxicity.     Hospitalization 09/22/2019-09/25/2019: A. fib with RVR status post cardioversion, currently on Eliquis Hospitalization 02/28/2022-03/11/2022: Atrial fibrillation: Cardioversion planned for 03/22/2022. 05/12/22: Ablation and Pacemaker Implantation    Lab review: 09/25/2019: WBC 7.2, hemoglobin 9.6, platelets 145 04/14/22: Hb 7.8, Platelets 91 (PRBC) 06/14/22: Hb 10.4, Pl 151, Cr 1.66, Iron sat: 16%, Ferritin 287 07/05/22: Hemoglobin 9.7 08/14/22: Hemoglobin 7.4 09/26/2022: Hemoglobin 9.1 (Retacrit injection today) 11/07/2022: Hemoglobin 10.1 12/06/2022: Hemoglobin 9.5 (Retacrit will be given) 01/18/2023: Hemoglobin 10.6, MCV 69.4, platelets 130   IV iron: March 2024   Current treatment: Retacrit (will be held today)   He will come back again in 3 weeks for lab check and transfusions or Retacrit as needed.

## 2023-01-31 DIAGNOSIS — H353211 Exudative age-related macular degeneration, right eye, with active choroidal neovascularization: Secondary | ICD-10-CM | POA: Diagnosis not present

## 2023-02-01 ENCOUNTER — Other Ambulatory Visit: Payer: Self-pay

## 2023-02-01 DIAGNOSIS — D638 Anemia in other chronic diseases classified elsewhere: Secondary | ICD-10-CM

## 2023-02-08 ENCOUNTER — Other Ambulatory Visit: Payer: Self-pay

## 2023-02-08 ENCOUNTER — Inpatient Hospital Stay: Payer: Medicare PPO

## 2023-02-08 ENCOUNTER — Inpatient Hospital Stay: Payer: Medicare PPO | Attending: Hematology and Oncology

## 2023-02-08 VITALS — BP 131/79 | HR 83 | Temp 98.0°F | Resp 16

## 2023-02-08 DIAGNOSIS — D638 Anemia in other chronic diseases classified elsewhere: Secondary | ICD-10-CM

## 2023-02-08 DIAGNOSIS — D563 Thalassemia minor: Secondary | ICD-10-CM | POA: Insufficient documentation

## 2023-02-08 DIAGNOSIS — I4891 Unspecified atrial fibrillation: Secondary | ICD-10-CM | POA: Diagnosis not present

## 2023-02-08 DIAGNOSIS — Z7901 Long term (current) use of anticoagulants: Secondary | ICD-10-CM | POA: Insufficient documentation

## 2023-02-08 LAB — CBC WITH DIFFERENTIAL (CANCER CENTER ONLY)
Abs Immature Granulocytes: 0.05 10*3/uL (ref 0.00–0.07)
Basophils Absolute: 0 10*3/uL (ref 0.0–0.1)
Basophils Relative: 0 %
Eosinophils Absolute: 0.2 10*3/uL (ref 0.0–0.5)
Eosinophils Relative: 2 %
HCT: 33 % — ABNORMAL LOW (ref 39.0–52.0)
Hemoglobin: 10.2 g/dL — ABNORMAL LOW (ref 13.0–17.0)
Immature Granulocytes: 1 %
Lymphocytes Relative: 9 %
Lymphs Abs: 0.8 10*3/uL (ref 0.7–4.0)
MCH: 21.3 pg — ABNORMAL LOW (ref 26.0–34.0)
MCHC: 30.9 g/dL (ref 30.0–36.0)
MCV: 68.8 fL — ABNORMAL LOW (ref 80.0–100.0)
Monocytes Absolute: 0.6 10*3/uL (ref 0.1–1.0)
Monocytes Relative: 7 %
Neutro Abs: 7.2 10*3/uL (ref 1.7–7.7)
Neutrophils Relative %: 81 %
Platelet Count: 158 10*3/uL (ref 150–400)
RBC: 4.8 MIL/uL (ref 4.22–5.81)
RDW: 15.9 % — ABNORMAL HIGH (ref 11.5–15.5)
WBC Count: 8.8 10*3/uL (ref 4.0–10.5)
nRBC: 0 % (ref 0.0–0.2)

## 2023-02-08 LAB — SAMPLE TO BLOOD BANK

## 2023-02-08 MED ORDER — EPOETIN ALFA-EPBX 40000 UNIT/ML IJ SOLN
40000.0000 [IU] | Freq: Once | INTRAMUSCULAR | Status: AC
  Administered 2023-02-08: 40000 [IU] via SUBCUTANEOUS
  Filled 2023-02-08: qty 1

## 2023-02-08 NOTE — Patient Instructions (Signed)

## 2023-02-25 NOTE — Progress Notes (Signed)
Patient Care Team: Joya Martyr, MD as PCP - General (Internal Medicine) Quintella Reichert, MD as PCP - Cardiology (Cardiology) Marinus Maw, MD as PCP - Electrophysiology (Cardiology) Quintella Reichert, MD as Consulting Physician (Cardiology) Serena Croissant, MD as Consulting Physician (Hematology and Oncology)  DIAGNOSIS: No diagnosis found.  SUMMARY OF ONCOLOGIC HISTORY: Oncology History   No history exists.    CHIEF COMPLIANT:   INTERVAL HISTORY: Joshua Hudson is a   ALLERGIES:  has No Known Allergies.  MEDICATIONS:  Current Outpatient Medications  Medication Sig Dispense Refill   apixaban (ELIQUIS) 2.5 MG TABS tablet Take 1 tablet (2.5 mg total) by mouth 2 (two) times daily. 180 tablet 1   cholestyramine (QUESTRAN) 4 g packet Take 4 g by mouth daily.     CREON 24000-76000 units CPEP as directed Orally three times a day     empagliflozin (JARDIANCE) 10 MG TABS tablet Take 1 tablet (10 mg total) by mouth daily. 90 tablet 3   epoetin alfa-epbx (RETACRIT) 16109 UNIT/ML injection Inject 40,000 Units into the skin every 6 (six) weeks. As needed     finasteride (PROSCAR) 5 MG tablet Take 5 mg by mouth daily.     fluticasone (FLONASE) 50 MCG/ACT nasal spray Place 1 spray into both nostrils daily as needed for allergies or rhinitis.     ketotifen (ZADITOR) 0.025 % ophthalmic solution Place 1 drop into both eyes 2 (two) times daily as needed (itchy eyes).     levothyroxine (SYNTHROID) 88 MCG tablet Take 88 mcg by mouth every morning.     oxyCODONE (ROXICODONE) 5 MG immediate release tablet Take 1 tablet (5 mg total) by mouth every 4 (four) hours as needed for severe pain. 10 tablet 0   pantoprazole (PROTONIX) 40 MG tablet Take 1 tablet (40 mg total) by mouth 2 (two) times daily before a meal for 30 days, THEN 1 tablet (40 mg total) daily. 120 tablet 0   potassium chloride SA (KLOR-CON M) 20 MEQ tablet Take 1 tablet (20 mEq total) by mouth every other day. 45 tablet 1    predniSONE (DELTASONE) 5 MG tablet Take 5 mg by mouth daily with breakfast. TAKING 15mg  QD FOR 3Wks     torsemide (DEMADEX) 20 MG tablet Take 40 mg (2  tablets) by mouth in the AM and 20 mg (1 tablet) by mouth in the PM 270 tablet 1   No current facility-administered medications for this visit.    PHYSICAL EXAMINATION: ECOG PERFORMANCE STATUS: {CHL ONC ECOG [phone]}  There were no vitals filed for this visit. There were no vitals filed for this visit.  BREAST:*** No palpable masses or nodules in either right or left breasts. No palpable axillary supraclavicular or infraclavicular adenopathy no breast tenderness or nipple discharge. (exam performed in the presence of a chaperone)  LABORATORY DATA:  I have reviewed the data as listed    Latest Ref Rng & Units 12/27/2022    7:37 AM 12/06/2022    7:23 AM 11/07/2022    7:21 AM  CMP  Glucose 70 - 99 mg/dL 97  604  89   BUN 8 - 23 mg/dL 37  33  25   Creatinine 0.61 - 1.24 mg/dL 5.40  9.81  1.91   Sodium 135 - 145 mmol/L 142  140  142   Potassium 3.5 - 5.1 mmol/L 3.5  3.9  3.4   Chloride 98 - 111 mmol/L 103  104  102   CO2 22 -  32 mmol/L 30  30  32   Calcium 8.9 - 10.3 mg/dL 9.5  9.5  9.6   Total Protein 6.5 - 8.1 g/dL 6.4  6.1  6.6   Total Bilirubin 0.3 - 1.2 mg/dL 0.4  0.5  0.4   Alkaline Phos 38 - 126 U/L 59  56  54   AST 15 - 41 U/L 16  16  15    ALT 0 - 44 U/L 8  9  8      Lab Results  Component Value Date   WBC 8.8 02/08/2023   HGB 10.2 (L) 02/08/2023   HCT 33.0 (L) 02/08/2023   MCV 68.8 (L) 02/08/2023   PLT 158 02/08/2023   NEUTROABS 7.2 02/08/2023    ASSESSMENT & PLAN:  No problem-specific Assessment & Plan notes found for this encounter.    No orders of the defined types were placed in this encounter.  The patient has a good understanding of the overall plan. he agrees with it. he will call with any problems that may develop before the next visit here. Total time spent: 30 mins including face to face time and  time spent for planning, charting and co-ordination of care   Sherlyn Lick, CMA 02/25/23    I Janan Ridge am acting as a Neurosurgeon for [employer]  ***

## 2023-03-01 ENCOUNTER — Inpatient Hospital Stay: Payer: Medicare PPO | Admitting: Hematology and Oncology

## 2023-03-01 ENCOUNTER — Inpatient Hospital Stay: Payer: Medicare PPO

## 2023-03-01 ENCOUNTER — Other Ambulatory Visit: Payer: Self-pay

## 2023-03-01 VITALS — BP 130/74 | HR 88 | Temp 97.5°F | Resp 18 | Ht 63.0 in | Wt 112.4 lb

## 2023-03-01 DIAGNOSIS — Z7901 Long term (current) use of anticoagulants: Secondary | ICD-10-CM | POA: Diagnosis not present

## 2023-03-01 DIAGNOSIS — D638 Anemia in other chronic diseases classified elsewhere: Secondary | ICD-10-CM

## 2023-03-01 DIAGNOSIS — D563 Thalassemia minor: Secondary | ICD-10-CM

## 2023-03-01 DIAGNOSIS — I4891 Unspecified atrial fibrillation: Secondary | ICD-10-CM | POA: Diagnosis not present

## 2023-03-01 LAB — CBC WITH DIFFERENTIAL (CANCER CENTER ONLY)
Abs Immature Granulocytes: 0.06 10*3/uL (ref 0.00–0.07)
Basophils Absolute: 0 10*3/uL (ref 0.0–0.1)
Basophils Relative: 0 %
Eosinophils Absolute: 0.2 10*3/uL (ref 0.0–0.5)
Eosinophils Relative: 2 %
HCT: 34.1 % — ABNORMAL LOW (ref 39.0–52.0)
Hemoglobin: 10.6 g/dL — ABNORMAL LOW (ref 13.0–17.0)
Immature Granulocytes: 1 %
Lymphocytes Relative: 7 %
Lymphs Abs: 0.7 10*3/uL (ref 0.7–4.0)
MCH: 21.6 pg — ABNORMAL LOW (ref 26.0–34.0)
MCHC: 31.1 g/dL (ref 30.0–36.0)
MCV: 69.5 fL — ABNORMAL LOW (ref 80.0–100.0)
Monocytes Absolute: 0.6 10*3/uL (ref 0.1–1.0)
Monocytes Relative: 6 %
Neutro Abs: 8.6 10*3/uL — ABNORMAL HIGH (ref 1.7–7.7)
Neutrophils Relative %: 84 %
Platelet Count: 148 10*3/uL — ABNORMAL LOW (ref 150–400)
RBC: 4.91 MIL/uL (ref 4.22–5.81)
RDW: 16 % — ABNORMAL HIGH (ref 11.5–15.5)
WBC Count: 10.2 10*3/uL (ref 4.0–10.5)
nRBC: 0 % (ref 0.0–0.2)

## 2023-03-01 LAB — SAMPLE TO BLOOD BANK

## 2023-03-01 NOTE — Assessment & Plan Note (Signed)
Hemoglobin electrophoresis: Beta thalassemia minor.  This is the cause of microcytosis.  It is not iron deficiency related.   Hospitalization 09/22/2019-09/25/2019: A. fib with RVR status post cardioversion, currently on Eliquis Hospitalization 02/28/2022-03/11/2022: Atrial fibrillation: Cardioversion planned for 03/22/2022. 05/12/22: Ablation and Pacemaker Implantation    Lab review: 09/25/2019: WBC 7.2, hemoglobin 9.6, platelets 145 04/14/22: Hb 7.8, Platelets 91 (PRBC) 06/14/22: Hb 10.4, Pl 151, Cr 1.66, Iron sat: 16%, Ferritin 287 07/05/22: Hemoglobin 9.7 08/14/22: Hemoglobin 7.4 09/26/2022: Hemoglobin 9.1 (Retacrit injection today) 11/07/2022: Hemoglobin 10.1 12/06/2022: Hemoglobin 9.5 (Retacrit will be given) 01/18/2023: Hemoglobin 10.6, MCV 69.4, platelets 130 02/08/2023: Hemoglobin 10.2, platelets 158   IV iron: March 2024   Current treatment: Retacrit (will be held today)   He will come back again in 3 weeks for lab check and transfusions or Retacrit as needed

## 2023-03-07 DIAGNOSIS — H353211 Exudative age-related macular degeneration, right eye, with active choroidal neovascularization: Secondary | ICD-10-CM | POA: Diagnosis not present

## 2023-03-15 ENCOUNTER — Ambulatory Visit (INDEPENDENT_AMBULATORY_CARE_PROVIDER_SITE_OTHER): Payer: Medicare PPO

## 2023-03-15 DIAGNOSIS — I5042 Chronic combined systolic (congestive) and diastolic (congestive) heart failure: Secondary | ICD-10-CM | POA: Diagnosis not present

## 2023-03-15 LAB — CUP PACEART REMOTE DEVICE CHECK
Battery Remaining Longevity: 144 mo
Battery Voltage: 3.09 V
Brady Statistic RA Percent Paced: 0 %
Brady Statistic RV Percent Paced: 99.9 %
Date Time Interrogation Session: 20240711005236
Implantable Lead Connection Status: 753985
Implantable Lead Connection Status: 753985
Implantable Lead Implant Date: 20230908
Implantable Lead Implant Date: 20230908
Implantable Lead Location: 753859
Implantable Lead Location: 753860
Implantable Lead Model: 3830
Implantable Lead Model: 5076
Implantable Pulse Generator Implant Date: 20230908
Lead Channel Impedance Value: 285 Ohm
Lead Channel Impedance Value: 342 Ohm
Lead Channel Impedance Value: 494 Ohm
Lead Channel Impedance Value: 494 Ohm
Lead Channel Pacing Threshold Amplitude: 0.625 V
Lead Channel Pacing Threshold Amplitude: 0.875 V
Lead Channel Pacing Threshold Pulse Width: 0.4 ms
Lead Channel Pacing Threshold Pulse Width: 0.4 ms
Lead Channel Sensing Intrinsic Amplitude: 2.25 mV
Lead Channel Sensing Intrinsic Amplitude: 2.25 mV
Lead Channel Sensing Intrinsic Amplitude: 9.625 mV
Lead Channel Sensing Intrinsic Amplitude: 9.625 mV
Lead Channel Setting Pacing Amplitude: 2 V
Lead Channel Setting Pacing Amplitude: 3 V
Lead Channel Setting Pacing Pulse Width: 0.4 ms
Lead Channel Setting Sensing Sensitivity: 1.2 mV
Zone Setting Status: 755011

## 2023-03-21 ENCOUNTER — Other Ambulatory Visit: Payer: Self-pay

## 2023-03-21 DIAGNOSIS — D638 Anemia in other chronic diseases classified elsewhere: Secondary | ICD-10-CM

## 2023-03-22 ENCOUNTER — Other Ambulatory Visit: Payer: Self-pay

## 2023-03-22 ENCOUNTER — Inpatient Hospital Stay: Payer: Medicare PPO

## 2023-03-22 ENCOUNTER — Inpatient Hospital Stay: Payer: Medicare PPO | Attending: Hematology and Oncology

## 2023-03-22 DIAGNOSIS — D563 Thalassemia minor: Secondary | ICD-10-CM | POA: Insufficient documentation

## 2023-03-22 DIAGNOSIS — D638 Anemia in other chronic diseases classified elsewhere: Secondary | ICD-10-CM | POA: Insufficient documentation

## 2023-03-22 LAB — CBC WITH DIFFERENTIAL (CANCER CENTER ONLY)
Abs Immature Granulocytes: 0.08 10*3/uL — ABNORMAL HIGH (ref 0.00–0.07)
Basophils Absolute: 0 10*3/uL (ref 0.0–0.1)
Basophils Relative: 0 %
Eosinophils Absolute: 0.3 10*3/uL (ref 0.0–0.5)
Eosinophils Relative: 3 %
HCT: 33.6 % — ABNORMAL LOW (ref 39.0–52.0)
Hemoglobin: 10.6 g/dL — ABNORMAL LOW (ref 13.0–17.0)
Immature Granulocytes: 1 %
Lymphocytes Relative: 8 %
Lymphs Abs: 0.7 10*3/uL (ref 0.7–4.0)
MCH: 21.5 pg — ABNORMAL LOW (ref 26.0–34.0)
MCHC: 31.5 g/dL (ref 30.0–36.0)
MCV: 68 fL — ABNORMAL LOW (ref 80.0–100.0)
Monocytes Absolute: 0.7 10*3/uL (ref 0.1–1.0)
Monocytes Relative: 7 %
Neutro Abs: 8.1 10*3/uL — ABNORMAL HIGH (ref 1.7–7.7)
Neutrophils Relative %: 81 %
Platelet Count: 138 10*3/uL — ABNORMAL LOW (ref 150–400)
RBC: 4.94 MIL/uL (ref 4.22–5.81)
RDW: 16.1 % — ABNORMAL HIGH (ref 11.5–15.5)
WBC Count: 9.9 10*3/uL (ref 4.0–10.5)
nRBC: 0.2 % (ref 0.0–0.2)

## 2023-03-22 LAB — FERRITIN: Ferritin: 50 ng/mL (ref 24–336)

## 2023-03-22 LAB — SAMPLE TO BLOOD BANK

## 2023-03-22 LAB — VITAMIN B12: Vitamin B-12: 157 pg/mL — ABNORMAL LOW (ref 180–914)

## 2023-03-22 LAB — IRON AND IRON BINDING CAPACITY (CC-WL,HP ONLY)
Iron: 104 ug/dL (ref 45–182)
Saturation Ratios: 39 % (ref 17.9–39.5)
TIBC: 265 ug/dL (ref 250–450)
UIBC: 161 ug/dL (ref 117–376)

## 2023-03-22 NOTE — Progress Notes (Unsigned)
Hgb 10.6. No indication for PRBC's/Retacrit today. Dr Pamelia Hoit & RN aware. Pt given copy of lab results. Pt aware to call if negative symptoms arise. Verbalized understanding of next appointment on 04/12/23.

## 2023-03-29 ENCOUNTER — Other Ambulatory Visit: Payer: Self-pay | Admitting: Physician Assistant

## 2023-04-02 ENCOUNTER — Ambulatory Visit: Payer: Medicare PPO | Admitting: Cardiology

## 2023-04-03 NOTE — Progress Notes (Signed)
Remote pacemaker transmission.   

## 2023-04-09 DIAGNOSIS — R3912 Poor urinary stream: Secondary | ICD-10-CM | POA: Diagnosis not present

## 2023-04-09 DIAGNOSIS — R310 Gross hematuria: Secondary | ICD-10-CM | POA: Diagnosis not present

## 2023-04-09 DIAGNOSIS — N401 Enlarged prostate with lower urinary tract symptoms: Secondary | ICD-10-CM | POA: Diagnosis not present

## 2023-04-10 NOTE — Progress Notes (Signed)
Patient Care Team: Joya Martyr, MD as PCP - General (Internal Medicine) Quintella Reichert, MD as PCP - Cardiology (Cardiology) Marinus Maw, MD as PCP - Electrophysiology (Cardiology) Quintella Reichert, MD as Consulting Physician (Cardiology) Serena Croissant, MD as Consulting Physician (Hematology and Oncology)  DIAGNOSIS: No diagnosis found.  SUMMARY OF ONCOLOGIC HISTORY: Oncology History   No history exists.    CHIEF COMPLIANT: Follow up anemia    INTERVAL HISTORY: Joshua Hudson is a 88 year old with above-mentioned history of chronic anemia previously requiring blood transfusions. He presents to the clinic for a follow-up.    ALLERGIES:  has No Known Allergies.  MEDICATIONS:  Current Outpatient Medications  Medication Sig Dispense Refill   apixaban (ELIQUIS) 2.5 MG TABS tablet Take 1 tablet (2.5 mg total) by mouth 2 (two) times daily. 180 tablet 1   cholestyramine (QUESTRAN) 4 g packet Take 4 g by mouth daily.     CREON 24000-76000 units CPEP as directed Orally three times a day     empagliflozin (JARDIANCE) 10 MG TABS tablet Take 1 tablet (10 mg total) by mouth daily. 90 tablet 3   epoetin alfa-epbx (RETACRIT) 36644 UNIT/ML injection Inject 40,000 Units into the skin every 6 (six) weeks. As needed     finasteride (PROSCAR) 5 MG tablet Take 5 mg by mouth daily.     fluticasone (FLONASE) 50 MCG/ACT nasal spray Place 1 spray into both nostrils daily as needed for allergies or rhinitis.     ketotifen (ZADITOR) 0.025 % ophthalmic solution Place 1 drop into both eyes 2 (two) times daily as needed (itchy eyes).     levothyroxine (SYNTHROID) 88 MCG tablet Take 88 mcg by mouth every morning.     oxyCODONE (ROXICODONE) 5 MG immediate release tablet Take 1 tablet (5 mg total) by mouth every 4 (four) hours as needed for severe pain. 10 tablet 0   pantoprazole (PROTONIX) 40 MG tablet Take 1 tablet (40 mg total) by mouth 2 (two) times daily before a meal for 30 days, THEN 1 tablet  (40 mg total) daily. 120 tablet 0   potassium chloride SA (KLOR-CON M) 20 MEQ tablet TAKE 1 TABLET(20 MEQ) BY MOUTH EVERY OTHER DAY 45 tablet 1   predniSONE (DELTASONE) 5 MG tablet Take 5 mg by mouth daily with breakfast. TAKING 15mg  QD FOR 3Wks     torsemide (DEMADEX) 20 MG tablet Take 40 mg (2  tablets) by mouth in the AM and 20 mg (1 tablet) by mouth in the PM 270 tablet 1   No current facility-administered medications for this visit.    PHYSICAL EXAMINATION: ECOG PERFORMANCE STATUS: {CHL ONC ECOG PS:[phone]}  There were no vitals filed for this visit. There were no vitals filed for this visit.  BREAST:*** No palpable masses or nodules in either right or left breasts. No palpable axillary supraclavicular or infraclavicular adenopathy no breast tenderness or nipple discharge. (exam performed in the presence of a chaperone)  LABORATORY DATA:  I have reviewed the data as listed    Latest Ref Rng & Units 12/27/2022    7:37 AM 12/06/2022    7:23 AM 11/07/2022    7:21 AM  CMP  Glucose 70 - 99 mg/dL 97  034  89   BUN 8 - 23 mg/dL 37  33  25   Creatinine 0.61 - 1.24 mg/dL 7.42  5.95  6.38   Sodium 135 - 145 mmol/L 142  140  142   Potassium 3.5 - 5.1  mmol/L 3.5  3.9  3.4   Chloride 98 - 111 mmol/L 103  104  102   CO2 22 - 32 mmol/L 30  30  32   Calcium 8.9 - 10.3 mg/dL 9.5  9.5  9.6   Total Protein 6.5 - 8.1 g/dL 6.4  6.1  6.6   Total Bilirubin 0.3 - 1.2 mg/dL 0.4  0.5  0.4   Alkaline Phos 38 - 126 U/L 59  56  54   AST 15 - 41 U/L 16  16  15    ALT 0 - 44 U/L 8  9  8      Lab Results  Component Value Date   WBC 9.9 03/22/2023   HGB 10.6 (L) 03/22/2023   HCT 33.6 (L) 03/22/2023   MCV 68.0 (L) 03/22/2023   PLT 138 (L) 03/22/2023   NEUTROABS 8.1 (H) 03/22/2023    ASSESSMENT & PLAN:  No problem-specific Assessment & Plan notes found for this encounter.    No orders of the defined types were placed in this encounter.  The patient has a good understanding of the overall plan.  he agrees with it. he will call with any problems that may develop before the next visit here. Total time spent: 30 mins including face to face time and time spent for planning, charting and co-ordination of care   Sherlyn Lick, CMA 04/10/23    sacroiliac

## 2023-04-12 ENCOUNTER — Inpatient Hospital Stay: Payer: Medicare PPO | Attending: Hematology and Oncology | Admitting: Hematology and Oncology

## 2023-04-12 ENCOUNTER — Inpatient Hospital Stay: Payer: Medicare PPO

## 2023-04-12 ENCOUNTER — Other Ambulatory Visit: Payer: Self-pay

## 2023-04-12 VITALS — BP 127/80 | HR 90 | Temp 97.8°F | Resp 18

## 2023-04-12 VITALS — BP 127/69 | HR 83 | Temp 97.5°F | Resp 18 | Ht 63.0 in | Wt 110.1 lb

## 2023-04-12 DIAGNOSIS — D638 Anemia in other chronic diseases classified elsewhere: Secondary | ICD-10-CM

## 2023-04-12 DIAGNOSIS — I4891 Unspecified atrial fibrillation: Secondary | ICD-10-CM | POA: Diagnosis not present

## 2023-04-12 DIAGNOSIS — Z7901 Long term (current) use of anticoagulants: Secondary | ICD-10-CM | POA: Insufficient documentation

## 2023-04-12 DIAGNOSIS — D563 Thalassemia minor: Secondary | ICD-10-CM | POA: Diagnosis present

## 2023-04-12 LAB — CBC WITH DIFFERENTIAL (CANCER CENTER ONLY)
Abs Immature Granulocytes: 0.07 10*3/uL (ref 0.00–0.07)
Basophils Absolute: 0 10*3/uL (ref 0.0–0.1)
Basophils Relative: 0 %
Eosinophils Absolute: 0.2 10*3/uL (ref 0.0–0.5)
Eosinophils Relative: 2 %
HCT: 32.8 % — ABNORMAL LOW (ref 39.0–52.0)
Hemoglobin: 10.4 g/dL — ABNORMAL LOW (ref 13.0–17.0)
Immature Granulocytes: 1 %
Lymphocytes Relative: 10 %
Lymphs Abs: 1 10*3/uL (ref 0.7–4.0)
MCH: 21.5 pg — ABNORMAL LOW (ref 26.0–34.0)
MCHC: 31.7 g/dL (ref 30.0–36.0)
MCV: 67.9 fL — ABNORMAL LOW (ref 80.0–100.0)
Monocytes Absolute: 0.8 10*3/uL (ref 0.1–1.0)
Monocytes Relative: 8 %
Neutro Abs: 7.8 10*3/uL — ABNORMAL HIGH (ref 1.7–7.7)
Neutrophils Relative %: 79 %
Platelet Count: 157 10*3/uL (ref 150–400)
RBC: 4.83 MIL/uL (ref 4.22–5.81)
RDW: 16.1 % — ABNORMAL HIGH (ref 11.5–15.5)
WBC Count: 9.8 10*3/uL (ref 4.0–10.5)
nRBC: 0 % (ref 0.0–0.2)

## 2023-04-12 LAB — SAMPLE TO BLOOD BANK

## 2023-04-12 MED ORDER — EPOETIN ALFA-EPBX 40000 UNIT/ML IJ SOLN
40000.0000 [IU] | Freq: Once | INTRAMUSCULAR | Status: AC
  Administered 2023-04-12: 40000 [IU] via SUBCUTANEOUS
  Filled 2023-04-12: qty 1

## 2023-04-12 MED ORDER — CYANOCOBALAMIN 1000 MCG/ML IJ SOLN
1000.0000 ug | Freq: Once | INTRAMUSCULAR | Status: AC
  Administered 2023-04-12: 1000 ug via INTRAMUSCULAR
  Filled 2023-04-12: qty 1

## 2023-04-12 NOTE — Progress Notes (Signed)
Per Dr.Gudena Pt does not blood transfusion today Hgb 10.4; proceed with B12 injection and Retacrit injection.

## 2023-04-12 NOTE — Patient Instructions (Signed)
Vitamin B12 Injection What is this medication? Vitamin B12 (VAHY tuh min B12) prevents and treats low vitamin B12 levels in your body. It is used in people who do not get enough vitamin B12 from their diet or when their digestive tract does not absorb enough. Vitamin B12 plays an important role in maintaining the health of your nervous system and red blood cells. This medicine may be used for other purposes; ask your health care provider or pharmacist if you have questions. COMMON BRAND NAME(S): B-12 Compliance Kit, B-12 Injection Kit, Cyomin, Dodex, LA-12, Nutri-Twelve, Physicians EZ Use B-12, Primabalt, Vitamin Deficiency Injectable System - B12 What should I tell my care team before I take this medication? They need to know if you have any of these conditions: Kidney disease Leber's disease Megaloblastic anemia An unusual or allergic reaction to cyanocobalamin, cobalt, other medications, foods, dyes, or preservatives Pregnant or trying to get pregnant Breast-feeding How should I use this medication? This medication is injected into a muscle or deeply under the skin. It is usually given in a clinic or care team's office. However, your care team may teach you how to inject yourself. Follow all instructions. Talk to your care team about the use of this medication in children. Special care may be needed. Overdosage: If you think you have taken too much of this medicine contact a poison control center or emergency room at once. NOTE: This medicine is only for you. Do not share this medicine with others. What if I miss a dose? If you are given your dose at a clinic or care team's office, call to reschedule your appointment. If you give your own injections, and you miss a dose, take it as soon as you can. If it is almost time for your next dose, take only that dose. Do not take double or extra doses. What may interact with this medication? Alcohol Colchicine This list may not describe all possible  interactions. Give your health care provider a list of all the medicines, herbs, non-prescription drugs, or dietary supplements you use. Also tell them if you smoke, drink alcohol, or use illegal drugs. Some items may interact with your medicine. What should I watch for while using this medication? Visit your care team regularly. You may need blood work done while you are taking this medication. You may need to follow a special diet. Talk to your care team. Limit your alcohol intake and avoid smoking to get the best benefit. What side effects may I notice from receiving this medication? Side effects that you should report to your care team as soon as possible: Allergic reactions--skin rash, itching, hives, swelling of the face, lips, tongue, or throat Swelling of the ankles, hands, or feet Trouble breathing Side effects that usually do not require medical attention (report to your care team if they continue or are bothersome): Diarrhea This list may not describe all possible side effects. Call your doctor for medical advice about side effects. You may report side effects to FDA at 1-800-FDA-1088. Where should I keep my medication? Keep out of the reach of children. Store at room temperature between 15 and 30 degrees C (59 and 85 degrees F). Protect from light. Throw away any unused medication after the expiration date. NOTE: This sheet is a summary. It may not cover all possible information. If you have questions about this medicine, talk to your doctor, pharmacist, or health care provider. Epoetin Alfa Injection What is this medication? EPOETIN ALFA (e POE e tin AL  fa) treats low levels of red blood cells (anemia) caused by kidney disease, chemotherapy, or HIV medications. It can also be used in people who are at risk for blood loss during surgery. It works by Systems analyst make more red blood cells, which reduces the need for blood transfusions. This medicine may be used for other purposes;  ask your health care provider or pharmacist if you have questions. COMMON BRAND NAME(S): Epogen, Procrit, Retacrit What should I tell my care team before I take this medication? They need to know if you have any of these conditions: Blood clots Cancer Heart disease High blood pressure On dialysis Seizures Stroke An unusual or allergic reaction to epoetin alfa, albumin, benzyl alcohol, other medications, foods, dyes, or preservatives Pregnant or trying to get pregnant Breast-feeding How should I use this medication? This medication is injected into a vein or under the skin. It is usually given by your care team in a hospital or clinic setting. It may also be given at home. If you get this medication at home, you will be taught how to prepare and give it. Use exactly as directed. Take it as directed on the prescription label at the same time every day. Keep taking it unless your care team tells you to stop. It is important that you put your used needles and syringes in a special sharps container. Do not put them in a trash can. If you do not have a sharps container, call your pharmacist or care team to get one. A special MedGuide will be given to you by the pharmacist with each prescription and refill. Be sure to read this information carefully each time. Talk to your care team about the use of this medication in children. While this medication may be used in children as young as 1 month of age for selected conditions, precautions do apply. Overdosage: If you think you have taken too much of this medicine contact a poison control center or emergency room at once. NOTE: This medicine is only for you. Do not share this medicine with others. What if I miss a dose? If you miss a dose, take it as soon as you can. If it is almost time for your next dose, take only that dose. Do not take double or extra doses. What may interact with this medication? Darbepoetin alfa Methoxy polyethylene  glycol-epoetin beta This list may not describe all possible interactions. Give your health care provider a list of all the medicines, herbs, non-prescription drugs, or dietary supplements you use. Also tell them if you smoke, drink alcohol, or use illegal drugs. Some items may interact with your medicine. What should I watch for while using this medication? Visit your care team for regular checks on your progress. Check your blood pressure as directed. Know what your blood pressure should be and when to contact your care team. Your condition will be monitored carefully while you are receiving this medication. You may need blood work while taking this medication. What side effects may I notice from receiving this medication? Side effects that you should report to your care team as soon as possible: Allergic reactions--skin rash, itching, hives, swelling of the face, lips, tongue, or throat Blood clot--pain, swelling, or warmth in the leg, shortness of breath, chest pain Heart attack--pain or tightness in the chest, shoulders, arms, or jaw, nausea, shortness of breath, cold or clammy skin, feeling faint or lightheaded Increase in blood pressure Rash, fever, and swollen lymph nodes Redness, blistering, peeling, or  loosening of the skin, including inside the mouth Seizures Stroke--sudden numbness or weakness of the face, arm, or leg, trouble speaking, confusion, trouble walking, loss of balance or coordination, dizziness, severe headache, change in vision Side effects that usually do not require medical attention (report to your care team if they continue or are bothersome): Bone, joint, or muscle pain Cough Headache Nausea Pain, redness, or irritation at injection site This list may not describe all possible side effects. Call your doctor for medical advice about side effects. You may report side effects to FDA at 1-800-FDA-1088. Where should I keep my medication? Keep out of the reach of  children and pets. Store in a refrigerator. Do not freeze. Do not shake. Protect from light. Keep this medication in the original container until you are ready to take it. See product for storage information. Get rid of any unused medication after the expiration date. To get rid of medications that are no longer needed or have expired: Take the medication to a medication take-back program. Check with your pharmacy or law enforcement to find a location. If you cannot return the medication, ask your pharmacist or care team how to get rid of the medication safely. NOTE: This sheet is a summary. It may not cover all possible information. If you have questions about this medicine, talk to your doctor, pharmacist, or health care provider.  2024 Elsevier/Gold Standard (2021-12-23 00:00:00)   2024 Elsevier/Gold Standard (2021-05-03 00:00:00)

## 2023-04-12 NOTE — Assessment & Plan Note (Signed)
Hemoglobin electrophoresis: Beta thalassemia minor.  This is the cause of microcytosis.  It is not iron deficiency related.    Hospitalization 09/22/2019-09/25/2019: A. fib with RVR status post cardioversion, currently on Eliquis Hospitalization 02/28/2022-03/11/2022: Atrial fibrillation: Cardioversion planned for 03/22/2022. 05/12/22: Ablation and Pacemaker Implantation    Lab review: 09/25/2019: WBC 7.2, hemoglobin 9.6, platelets 145 04/14/22: Hb 7.8, Platelets 91 (PRBC) 06/14/22: Hb 10.4, Pl 151, Cr 1.66, Iron sat: 16%, Ferritin 287 07/05/22: Hemoglobin 9.7 08/14/22: Hemoglobin 7.4 09/26/2022: Hemoglobin 9.1 (Retacrit injection today) 11/07/2022: Hemoglobin 10.1 12/06/2022: Hemoglobin 9.5 (Retacrit will be given) 01/18/2023: Hemoglobin 10.6, MCV 69.4, platelets 130 02/08/2023: Hemoglobin 10.2, platelets 158 03/01/2023: Hemoglobin 10.6 (holding Retacrit) 03/22/2023: Hemoglobin 10.6, iron saturation 39%, ferritin 50, B12 157   IV iron: March 2024   Current treatment:  Retacrit (will be held today) Recommend initiating monthly B12 injections   He will come back again in 3 weeks for lab check and transfusions or Retacrit as needed

## 2023-04-19 ENCOUNTER — Other Ambulatory Visit: Payer: Medicare PPO

## 2023-04-20 ENCOUNTER — Other Ambulatory Visit: Payer: Medicare PPO

## 2023-04-20 ENCOUNTER — Encounter: Payer: Self-pay | Admitting: Hematology and Oncology

## 2023-04-21 ENCOUNTER — Other Ambulatory Visit: Payer: Self-pay | Admitting: Internal Medicine

## 2023-04-21 DIAGNOSIS — I480 Paroxysmal atrial fibrillation: Secondary | ICD-10-CM

## 2023-04-23 NOTE — Telephone Encounter (Signed)
Prescription refill request for Eliquis received. Indication:afib Last office visit:3/24 Scr:2.24  4/24 Age: 88 Weight:49.9  kg  Prescription refilled

## 2023-04-25 ENCOUNTER — Other Ambulatory Visit: Payer: Medicare PPO

## 2023-05-02 ENCOUNTER — Other Ambulatory Visit: Payer: Self-pay

## 2023-05-02 DIAGNOSIS — D638 Anemia in other chronic diseases classified elsewhere: Secondary | ICD-10-CM

## 2023-05-03 ENCOUNTER — Inpatient Hospital Stay: Payer: Medicare PPO

## 2023-05-03 VITALS — BP 126/74 | HR 61 | Temp 97.8°F | Resp 18

## 2023-05-03 DIAGNOSIS — D563 Thalassemia minor: Secondary | ICD-10-CM | POA: Diagnosis not present

## 2023-05-03 DIAGNOSIS — D638 Anemia in other chronic diseases classified elsewhere: Secondary | ICD-10-CM

## 2023-05-03 LAB — CBC WITH DIFFERENTIAL (CANCER CENTER ONLY)
Abs Immature Granulocytes: 0.05 10*3/uL (ref 0.00–0.07)
Basophils Absolute: 0 10*3/uL (ref 0.0–0.1)
Basophils Relative: 0 %
Eosinophils Absolute: 0.2 10*3/uL (ref 0.0–0.5)
Eosinophils Relative: 2 %
HCT: 32.9 % — ABNORMAL LOW (ref 39.0–52.0)
Hemoglobin: 10.2 g/dL — ABNORMAL LOW (ref 13.0–17.0)
Immature Granulocytes: 1 %
Lymphocytes Relative: 10 %
Lymphs Abs: 0.8 10*3/uL (ref 0.7–4.0)
MCH: 21.3 pg — ABNORMAL LOW (ref 26.0–34.0)
MCHC: 31 g/dL (ref 30.0–36.0)
MCV: 68.7 fL — ABNORMAL LOW (ref 80.0–100.0)
Monocytes Absolute: 0.5 10*3/uL (ref 0.1–1.0)
Monocytes Relative: 6 %
Neutro Abs: 7.1 10*3/uL (ref 1.7–7.7)
Neutrophils Relative %: 81 %
Platelet Count: 157 10*3/uL (ref 150–400)
RBC: 4.79 MIL/uL (ref 4.22–5.81)
RDW: 15.8 % — ABNORMAL HIGH (ref 11.5–15.5)
WBC Count: 8.6 10*3/uL (ref 4.0–10.5)
nRBC: 0 % (ref 0.0–0.2)

## 2023-05-03 LAB — SAMPLE TO BLOOD BANK

## 2023-05-03 LAB — IRON AND IRON BINDING CAPACITY (CC-WL,HP ONLY)
Iron: 44 ug/dL — ABNORMAL LOW (ref 45–182)
Saturation Ratios: 13 % — ABNORMAL LOW (ref 17.9–39.5)
TIBC: 335 ug/dL (ref 250–450)
UIBC: 291 ug/dL

## 2023-05-03 LAB — FERRITIN: Ferritin: 20 ng/mL — ABNORMAL LOW (ref 24–336)

## 2023-05-03 LAB — VITAMIN B12: Vitamin B-12: 312 pg/mL (ref 180–914)

## 2023-05-03 MED ORDER — EPOETIN ALFA-EPBX 40000 UNIT/ML IJ SOLN
40000.0000 [IU] | Freq: Once | INTRAMUSCULAR | Status: AC
  Administered 2023-05-03: 40000 [IU] via SUBCUTANEOUS
  Filled 2023-05-03: qty 1

## 2023-05-03 MED ORDER — CYANOCOBALAMIN 1000 MCG/ML IJ SOLN
1000.0000 ug | Freq: Once | INTRAMUSCULAR | Status: AC
  Administered 2023-05-03: 1000 ug via INTRAMUSCULAR
  Filled 2023-05-03: qty 1

## 2023-05-03 NOTE — Patient Instructions (Signed)
 Vitamin B12 Injection What is this medication? Vitamin B12 (VAHY tuh min B12) prevents and treats low vitamin B12 levels in your body. It is used in people who do not get enough vitamin B12 from their diet or when their digestive tract does not absorb enough. Vitamin B12 plays an important role in maintaining the health of your nervous system and red blood cells. This medicine may be used for other purposes; ask your health care provider or pharmacist if you have questions. COMMON BRAND NAME(S): B-12 Compliance Kit, B-12 Injection Kit, Cyomin, Dodex, LA-12, Nutri-Twelve, Physicians EZ Use B-12, Primabalt, Vitamin Deficiency Injectable System - B12 What should I tell my care team before I take this medication? They need to know if you have any of these conditions: Kidney disease Leber's disease Megaloblastic anemia An unusual or allergic reaction to cyanocobalamin, cobalt, other medications, foods, dyes, or preservatives Pregnant or trying to get pregnant Breast-feeding How should I use this medication? This medication is injected into a muscle or deeply under the skin. It is usually given in a clinic or care team's office. However, your care team may teach you how to inject yourself. Follow all instructions. Talk to your care team about the use of this medication in children. Special care may be needed. Overdosage: If you think you have taken too much of this medicine contact a poison control center or emergency room at once. NOTE: This medicine is only for you. Do not share this medicine with others. What if I miss a dose? If you are given your dose at a clinic or care team's office, call to reschedule your appointment. If you give your own injections, and you miss a dose, take it as soon as you can. If it is almost time for your next dose, take only that dose. Do not take double or extra doses. What may interact with this medication? Alcohol Colchicine This list may not describe all possible  interactions. Give your health care provider a list of all the medicines, herbs, non-prescription drugs, or dietary supplements you use. Also tell them if you smoke, drink alcohol, or use illegal drugs. Some items may interact with your medicine. What should I watch for while using this medication? Visit your care team regularly. You may need blood work done while you are taking this medication. You may need to follow a special diet. Talk to your care team. Limit your alcohol intake and avoid smoking to get the best benefit. What side effects may I notice from receiving this medication? Side effects that you should report to your care team as soon as possible: Allergic reactions--skin rash, itching, hives, swelling of the face, lips, tongue, or throat Swelling of the ankles, hands, or feet Trouble breathing Side effects that usually do not require medical attention (report to your care team if they continue or are bothersome): Diarrhea This list may not describe all possible side effects. Call your doctor for medical advice about side effects. You may report side effects to FDA at 1-800-FDA-1088. Where should I keep my medication? Keep out of the reach of children. Store at room temperature between 15 and 30 degrees C (59 and 85 degrees F). Protect from light. Throw away any unused medication after the expiration date. NOTE: This sheet is a summary. It may not cover all possible information. If you have questions about this medicine, talk to your doctor, pharmacist, or health care provider. Epoetin Alfa Injection What is this medication? EPOETIN ALFA (e POE e tin AL  fa) treats low levels of red blood cells (anemia) caused by kidney disease, chemotherapy, or HIV medications. It can also be used in people who are at risk for blood loss during surgery. It works by Systems analyst make more red blood cells, which reduces the need for blood transfusions. This medicine may be used for other purposes;  ask your health care provider or pharmacist if you have questions. COMMON BRAND NAME(S): Epogen, Procrit, Retacrit What should I tell my care team before I take this medication? They need to know if you have any of these conditions: Blood clots Cancer Heart disease High blood pressure On dialysis Seizures Stroke An unusual or allergic reaction to epoetin alfa, albumin, benzyl alcohol, other medications, foods, dyes, or preservatives Pregnant or trying to get pregnant Breast-feeding How should I use this medication? This medication is injected into a vein or under the skin. It is usually given by your care team in a hospital or clinic setting. It may also be given at home. If you get this medication at home, you will be taught how to prepare and give it. Use exactly as directed. Take it as directed on the prescription label at the same time every day. Keep taking it unless your care team tells you to stop. It is important that you put your used needles and syringes in a special sharps container. Do not put them in a trash can. If you do not have a sharps container, call your pharmacist or care team to get one. A special MedGuide will be given to you by the pharmacist with each prescription and refill. Be sure to read this information carefully each time. Talk to your care team about the use of this medication in children. While this medication may be used in children as young as 1 month of age for selected conditions, precautions do apply. Overdosage: If you think you have taken too much of this medicine contact a poison control center or emergency room at once. NOTE: This medicine is only for you. Do not share this medicine with others. What if I miss a dose? If you miss a dose, take it as soon as you can. If it is almost time for your next dose, take only that dose. Do not take double or extra doses. What may interact with this medication? Darbepoetin alfa Methoxy polyethylene  glycol-epoetin beta This list may not describe all possible interactions. Give your health care provider a list of all the medicines, herbs, non-prescription drugs, or dietary supplements you use. Also tell them if you smoke, drink alcohol, or use illegal drugs. Some items may interact with your medicine. What should I watch for while using this medication? Visit your care team for regular checks on your progress. Check your blood pressure as directed. Know what your blood pressure should be and when to contact your care team. Your condition will be monitored carefully while you are receiving this medication. You may need blood work while taking this medication. What side effects may I notice from receiving this medication? Side effects that you should report to your care team as soon as possible: Allergic reactions--skin rash, itching, hives, swelling of the face, lips, tongue, or throat Blood clot--pain, swelling, or warmth in the leg, shortness of breath, chest pain Heart attack--pain or tightness in the chest, shoulders, arms, or jaw, nausea, shortness of breath, cold or clammy skin, feeling faint or lightheaded Increase in blood pressure Rash, fever, and swollen lymph nodes Redness, blistering, peeling, or  loosening of the skin, including inside the mouth Seizures Stroke--sudden numbness or weakness of the face, arm, or leg, trouble speaking, confusion, trouble walking, loss of balance or coordination, dizziness, severe headache, change in vision Side effects that usually do not require medical attention (report to your care team if they continue or are bothersome): Bone, joint, or muscle pain Cough Headache Nausea Pain, redness, or irritation at injection site This list may not describe all possible side effects. Call your doctor for medical advice about side effects. You may report side effects to FDA at 1-800-FDA-1088. Where should I keep my medication? Keep out of the reach of  children and pets. Store in a refrigerator. Do not freeze. Do not shake. Protect from light. Keep this medication in the original container until you are ready to take it. See product for storage information. Get rid of any unused medication after the expiration date. To get rid of medications that are no longer needed or have expired: Take the medication to a medication take-back program. Check with your pharmacy or law enforcement to find a location. If you cannot return the medication, ask your pharmacist or care team how to get rid of the medication safely. NOTE: This sheet is a summary. It may not cover all possible information. If you have questions about this medicine, talk to your doctor, pharmacist, or health care provider.  2024 Elsevier/Gold Standard (2021-12-23 00:00:00)   2024 Elsevier/Gold Standard (2021-05-03 00:00:00)

## 2023-05-03 NOTE — Progress Notes (Signed)
Hgb 10.2, no blood transfusion today. Okay to proceed with Retacrit injection.

## 2023-05-18 ENCOUNTER — Other Ambulatory Visit: Payer: Medicare PPO

## 2023-05-18 ENCOUNTER — Ambulatory Visit: Payer: Medicare PPO | Admitting: Hematology and Oncology

## 2023-05-19 ENCOUNTER — Other Ambulatory Visit: Payer: Self-pay | Admitting: Nurse Practitioner

## 2023-05-21 ENCOUNTER — Ambulatory Visit: Payer: Medicare PPO | Admitting: Hematology and Oncology

## 2023-05-21 ENCOUNTER — Other Ambulatory Visit: Payer: Medicare PPO

## 2023-05-22 ENCOUNTER — Other Ambulatory Visit: Payer: Self-pay

## 2023-05-22 DIAGNOSIS — I5033 Acute on chronic diastolic (congestive) heart failure: Secondary | ICD-10-CM

## 2023-05-22 DIAGNOSIS — D598 Other acquired hemolytic anemias: Secondary | ICD-10-CM

## 2023-05-22 DIAGNOSIS — D638 Anemia in other chronic diseases classified elsewhere: Secondary | ICD-10-CM

## 2023-05-22 DIAGNOSIS — D696 Thrombocytopenia, unspecified: Secondary | ICD-10-CM

## 2023-05-23 NOTE — Assessment & Plan Note (Signed)
Hemoglobin electrophoresis: Beta thalassemia minor.  This is the cause of microcytosis.  It is not iron deficiency related.    Hospitalization 09/22/2019-09/25/2019: A. fib with RVR status post cardioversion, currently on Eliquis Hospitalization 02/28/2022-03/11/2022: Atrial fibrillation: Cardioversion planned for 03/22/2022. 05/12/22: Ablation and Pacemaker Implantation    Lab review: 09/25/2019: WBC 7.2, hemoglobin 9.6, platelets 145 04/14/22: Hb 7.8, Platelets 91 (PRBC) 06/14/22: Hb 10.4, Pl 151, Cr 1.66, Iron sat: 16%, Ferritin 287 07/05/22: Hemoglobin 9.7 08/14/22: Hemoglobin 7.4 09/26/2022: Hemoglobin 9.1 (Retacrit injection today) 11/07/2022: Hemoglobin 10.1 12/06/2022: Hemoglobin 9.5 (Retacrit will be given) 01/18/2023: Hemoglobin 10.6, MCV 69.4, platelets 130 02/08/2023: Hemoglobin 10.2, platelets 158 03/01/2023: Hemoglobin 10.6 (holding Retacrit) 03/22/2023: Hemoglobin 10.6, iron saturation 39%, ferritin 50, B12 157 04/12/2023: Hemoglobin 10.4 (Retacrit and starting B12 injections) 05/24/2023:   IV iron: March 2024   Current treatment:  Retacrit to be given today Initiating every 3 week B12 injections   He will come back again in 3 weeks for lab check and transfusions or Retacrit as needed Every 6 weeks for MD visits

## 2023-05-24 ENCOUNTER — Inpatient Hospital Stay: Payer: Medicare PPO | Admitting: Hematology and Oncology

## 2023-05-24 ENCOUNTER — Inpatient Hospital Stay: Payer: Medicare PPO

## 2023-05-24 ENCOUNTER — Inpatient Hospital Stay: Payer: Medicare PPO | Attending: Hematology and Oncology

## 2023-05-24 VITALS — BP 130/65 | HR 60 | Resp 18

## 2023-05-24 VITALS — BP 124/65 | HR 84 | Temp 97.2°F | Resp 18 | Ht 63.0 in | Wt 110.0 lb

## 2023-05-24 DIAGNOSIS — I4891 Unspecified atrial fibrillation: Secondary | ICD-10-CM | POA: Diagnosis not present

## 2023-05-24 DIAGNOSIS — Z950 Presence of cardiac pacemaker: Secondary | ICD-10-CM | POA: Insufficient documentation

## 2023-05-24 DIAGNOSIS — D638 Anemia in other chronic diseases classified elsewhere: Secondary | ICD-10-CM

## 2023-05-24 DIAGNOSIS — D598 Other acquired hemolytic anemias: Secondary | ICD-10-CM

## 2023-05-24 DIAGNOSIS — D563 Thalassemia minor: Secondary | ICD-10-CM

## 2023-05-24 DIAGNOSIS — Z7901 Long term (current) use of anticoagulants: Secondary | ICD-10-CM | POA: Diagnosis not present

## 2023-05-24 LAB — IRON AND IRON BINDING CAPACITY (CC-WL,HP ONLY)
Iron: 36 ug/dL — ABNORMAL LOW (ref 45–182)
Saturation Ratios: 10 % — ABNORMAL LOW (ref 17.9–39.5)
TIBC: 351 ug/dL (ref 250–450)
UIBC: 315 ug/dL (ref 117–376)

## 2023-05-24 LAB — FERRITIN: Ferritin: 17 ng/mL — ABNORMAL LOW (ref 24–336)

## 2023-05-24 LAB — CBC WITH DIFFERENTIAL (CANCER CENTER ONLY)
Abs Immature Granulocytes: 0.04 10*3/uL (ref 0.00–0.07)
Basophils Absolute: 0 10*3/uL (ref 0.0–0.1)
Basophils Relative: 0 %
Eosinophils Absolute: 0.2 10*3/uL (ref 0.0–0.5)
Eosinophils Relative: 2 %
HCT: 32.6 % — ABNORMAL LOW (ref 39.0–52.0)
Hemoglobin: 9.9 g/dL — ABNORMAL LOW (ref 13.0–17.0)
Immature Granulocytes: 1 %
Lymphocytes Relative: 12 %
Lymphs Abs: 1 10*3/uL (ref 0.7–4.0)
MCH: 20.7 pg — ABNORMAL LOW (ref 26.0–34.0)
MCHC: 30.4 g/dL (ref 30.0–36.0)
MCV: 68.2 fL — ABNORMAL LOW (ref 80.0–100.0)
Monocytes Absolute: 1 10*3/uL (ref 0.1–1.0)
Monocytes Relative: 12 %
Neutro Abs: 5.9 10*3/uL (ref 1.7–7.7)
Neutrophils Relative %: 73 %
Platelet Count: 168 10*3/uL (ref 150–400)
RBC: 4.78 MIL/uL (ref 4.22–5.81)
RDW: 15.8 % — ABNORMAL HIGH (ref 11.5–15.5)
WBC Count: 8.1 10*3/uL (ref 4.0–10.5)
nRBC: 0 % (ref 0.0–0.2)

## 2023-05-24 LAB — SAMPLE TO BLOOD BANK

## 2023-05-24 LAB — VITAMIN B12: Vitamin B-12: 375 pg/mL (ref 180–914)

## 2023-05-24 MED ORDER — CYANOCOBALAMIN 1000 MCG/ML IJ SOLN
1000.0000 ug | Freq: Once | INTRAMUSCULAR | Status: AC
  Administered 2023-05-24: 1000 ug via INTRAMUSCULAR
  Filled 2023-05-24: qty 1

## 2023-05-24 MED ORDER — EPOETIN ALFA-EPBX 40000 UNIT/ML IJ SOLN: 40000.0000 [IU] | Freq: Once | INTRAMUSCULAR | Status: DC

## 2023-05-24 MED ORDER — SODIUM CHLORIDE 0.9 % IV SOLN: Freq: Once | INTRAVENOUS | Status: AC

## 2023-05-24 MED ORDER — SODIUM CHLORIDE 0.9 % IV SOLN
300.0000 mg | Freq: Once | INTRAVENOUS | Status: AC
  Administered 2023-05-24: 300 mg via INTRAVENOUS
  Filled 2023-05-24: qty 300

## 2023-05-24 NOTE — Patient Instructions (Signed)
Iron Sucrose Injection What is this medication? IRON SUCROSE (EYE ern SOO krose) treats low levels of iron (iron deficiency anemia) in people with kidney disease. Iron is a mineral that plays an important role in making red blood cells, which carry oxygen from your lungs to the rest of your body. This medicine may be used for other purposes; ask your health care provider or pharmacist if you have questions. COMMON BRAND NAME(S): Venofer What should I tell my care team before I take this medication? They need to know if you have any of these conditions: Anemia not caused by low iron levels Heart disease High levels of iron in the blood Kidney disease Liver disease An unusual or allergic reaction to iron, other medications, foods, dyes, or preservatives Pregnant or trying to get pregnant Breastfeeding How should I use this medication? This medication is for infusion into a vein. It is given in a hospital or clinic setting. Talk to your care team about the use of this medication in children. While this medication may be prescribed for children as young as 2 years for selected conditions, precautions do apply. Overdosage: If you think you have taken too much of this medicine contact a poison control center or emergency room at once. NOTE: This medicine is only for you. Do not share this medicine with others. What if I miss a dose? Keep appointments for follow-up doses. It is important not to miss your dose. Call your care team if you are unable to keep an appointment. What may interact with this medication? Do not take this medication with any of the following: Deferoxamine Dimercaprol Other iron products This medication may also interact with the following: Chloramphenicol Deferasirox This list may not describe all possible interactions. Give your health care provider a list of all the medicines, herbs, non-prescription drugs, or dietary supplements you use. Also tell them if you smoke,  drink alcohol, or use illegal drugs. Some items may interact with your medicine. What should I watch for while using this medication? Visit your care team regularly. Tell your care team if your symptoms do not start to get better or if they get worse. You may need blood work done while you are taking this medication. You may need to follow a special diet. Talk to your care team. Foods that contain iron include: whole grains/cereals, dried fruits, beans, or peas, leafy green vegetables, and organ meats (liver, kidney). What side effects may I notice from receiving this medication? Side effects that you should report to your care team as soon as possible: Allergic reactions--skin rash, itching, hives, swelling of the face, lips, tongue, or throat Low blood pressure--dizziness, feeling faint or lightheaded, blurry vision Shortness of breath Side effects that usually do not require medical attention (report to your care team if they continue or are bothersome): Flushing Headache Joint pain Muscle pain Nausea Pain, redness, or irritation at injection site This list may not describe all possible side effects. Call your doctor for medical advice about side effects. You may report side effects to FDA at 1-800-FDA-1088. Where should I keep my medication? This medication is given in a hospital or clinic. It will not be stored at home. NOTE: This sheet is a summary. It may not cover all possible information. If you have questions about this medicine, talk to your doctor, pharmacist, or health care provider.  2024 Elsevier/Gold Standard (2023-01-26 00:00:00)

## 2023-05-24 NOTE — Progress Notes (Signed)
Patient Care Team: Joya Martyr, MD as PCP - General (Internal Medicine) Quintella Reichert, MD as PCP - Cardiology (Cardiology) Marinus Maw, MD as PCP - Electrophysiology (Cardiology) Quintella Reichert, MD as Consulting Physician (Cardiology) Serena Croissant, MD as Consulting Physician (Hematology and Oncology)  DIAGNOSIS:  Encounter Diagnosis  Name Primary?   Anemia of chronic disease Yes      CHIEF COMPLIANT: Follow-up of anemia  Discussed the use of AI scribe software for clinical note transcription with the patient, who gave verbal consent to proceed.  History of Present Illness   The patient, with a history of anemia, presents with ongoing fatigue and low energy. Despite previous improvements in hemoglobin levels, he reports persistent fatigue, which has been worse than usual recently. The patient's hemoglobin levels have been stable in the tens for the past four months, but have recently dipped to 9.9. His iron levels have also shown a slight decrease recently, which may be contributing to his symptoms. The patient last received iron supplementation six months ago, which seemed to improve his hemoglobin levels for a period of time.         ALLERGIES:  has No Known Allergies.  MEDICATIONS:  Current Outpatient Medications  Medication Sig Dispense Refill   cholestyramine (QUESTRAN) 4 g packet Take 4 g by mouth daily.     CREON 24000-76000 units CPEP as directed Orally three times a day     ELIQUIS 2.5 MG TABS tablet TAKE 1 TABLET(2.5 MG) BY MOUTH TWICE DAILY 180 tablet 1   epoetin alfa-epbx (RETACRIT) 11914 UNIT/ML injection Inject 40,000 Units into the skin every 6 (six) weeks. As needed     finasteride (PROSCAR) 5 MG tablet Take 5 mg by mouth daily.     fluticasone (FLONASE) 50 MCG/ACT nasal spray Place 1 spray into both nostrils daily as needed for allergies or rhinitis.     JARDIANCE 10 MG TABS tablet TAKE 1 TABLET(10 MG) BY MOUTH DAILY 90 tablet 2   ketotifen  (ZADITOR) 0.025 % ophthalmic solution Place 1 drop into both eyes 2 (two) times daily as needed (itchy eyes).     levothyroxine (SYNTHROID) 88 MCG tablet Take 88 mcg by mouth every morning.     oxyCODONE (ROXICODONE) 5 MG immediate release tablet Take 1 tablet (5 mg total) by mouth every 4 (four) hours as needed for severe pain. 10 tablet 0   potassium chloride SA (KLOR-CON M) 20 MEQ tablet TAKE 1 TABLET(20 MEQ) BY MOUTH EVERY OTHER DAY 45 tablet 1   predniSONE (DELTASONE) 5 MG tablet Take 5 mg by mouth daily with breakfast. TAKING 15mg  QD FOR 3Wks     torsemide (DEMADEX) 20 MG tablet Take 40 mg (2  tablets) by mouth in the AM and 20 mg (1 tablet) by mouth in the PM 270 tablet 1   pantoprazole (PROTONIX) 40 MG tablet Take 1 tablet (40 mg total) by mouth 2 (two) times daily before a meal for 30 days, THEN 1 tablet (40 mg total) daily. 120 tablet 0   No current facility-administered medications for this visit.    PHYSICAL EXAMINATION: ECOG PERFORMANCE STATUS: 1 - Symptomatic but completely ambulatory  Vitals:   05/24/23 0809  BP: 124/65  Pulse: 84  Resp: 18  Temp: (!) 97.2 F (36.2 C)  SpO2: 100%   Filed Weights   05/24/23 0809  Weight: 110 lb (49.9 kg)    Physical Exam          (exam performed  in the presence of a chaperone)  LABORATORY DATA:  I have reviewed the data as listed    Latest Ref Rng & Units 12/27/2022    7:37 AM 12/06/2022    7:23 AM 11/07/2022    7:21 AM  CMP  Glucose 70 - 99 mg/dL 97  161  89   BUN 8 - 23 mg/dL 37  33  25   Creatinine 0.61 - 1.24 mg/dL 0.96  0.45  4.09   Sodium 135 - 145 mmol/L 142  140  142   Potassium 3.5 - 5.1 mmol/L 3.5  3.9  3.4   Chloride 98 - 111 mmol/L 103  104  102   CO2 22 - 32 mmol/L 30  30  32   Calcium 8.9 - 10.3 mg/dL 9.5  9.5  9.6   Total Protein 6.5 - 8.1 g/dL 6.4  6.1  6.6   Total Bilirubin 0.3 - 1.2 mg/dL 0.4  0.5  0.4   Alkaline Phos 38 - 126 U/L 59  56  54   AST 15 - 41 U/L 16  16  15    ALT 0 - 44 U/L 8  9  8       Lab Results  Component Value Date   WBC 8.1 05/24/2023   HGB 9.9 (L) 05/24/2023   HCT 32.6 (L) 05/24/2023   MCV 68.2 (L) 05/24/2023   PLT 168 05/24/2023   NEUTROABS 5.9 05/24/2023    ASSESSMENT & PLAN:  Anemia of chronic disease Hemoglobin electrophoresis: Beta thalassemia minor.  This is the cause of microcytosis.  It is not iron deficiency related.    Hospitalization 09/22/2019-09/25/2019: A. fib with RVR status post cardioversion, currently on Eliquis Hospitalization 02/28/2022-03/11/2022: Atrial fibrillation: Cardioversion planned for 03/22/2022. 05/12/22: Ablation and Pacemaker Implantation    Lab review: 09/25/2019: WBC 7.2, hemoglobin 9.6, platelets 145 04/14/22: Hb 7.8, Platelets 91 (PRBC) 06/14/22: Hb 10.4, Pl 151, Cr 1.66, Iron sat: 16%, Ferritin 287 07/05/22: Hemoglobin 9.7 08/14/22: Hemoglobin 7.4 09/26/2022: Hemoglobin 9.1 (Retacrit injection today) 11/07/2022: Hemoglobin 10.1 12/06/2022: Hemoglobin 9.5 (Retacrit will be given) 01/18/2023: Hemoglobin 10.6, MCV 69.4, platelets 130 02/08/2023: Hemoglobin 10.2, platelets 158 03/01/2023: Hemoglobin 10.6 (holding Retacrit) 03/22/2023: Hemoglobin 10.6, iron saturation 39%, ferritin 50, B12 157 04/12/2023: Hemoglobin 10.4 (Retacrit and starting B12 injections) 05/24/2023: Hemoglobin 9.9 (Retacrit, B12 and IV iron)   IV iron: March 2024   Current treatment:  Retacrit to be given today Initiating every 3 week B12 injections Requesting IV iron given the drop in the ferritin and the hemoglobin   He will come back again in 3 weeks for lab check and transfusions or Retacrit as needed Every 6 weeks for MD visits ------------------------------------- Assessment and Plan    Anemia Hemoglobin slightly decreased to 9.9, but still within patient's usual range. Iron levels have been dipping, which may be contributing to the patient's fatigue. -Administer B12 and Rituximab injections today. -Check iron levels today. -Schedule iron infusion,  ideally today if possible, then continue for the next two weeks.  Follow-up appointments -Return on 06/14/2023 to assess need for further treatment. -Return on 07/05/2023 for routine follow-up.          No orders of the defined types were placed in this encounter.  The patient has a good understanding of the overall plan. he agrees with it. he will call with any problems that may develop before the next visit here. Total time spent: 30 mins including face to face time and time spent for planning, charting and  co-ordination of care   Tamsen Meek, MD 05/24/23

## 2023-05-29 ENCOUNTER — Inpatient Hospital Stay: Payer: Medicare PPO

## 2023-05-29 ENCOUNTER — Other Ambulatory Visit: Payer: Self-pay

## 2023-05-29 VITALS — BP 137/84 | HR 79 | Temp 97.6°F | Resp 18

## 2023-05-29 DIAGNOSIS — D638 Anemia in other chronic diseases classified elsewhere: Secondary | ICD-10-CM

## 2023-05-29 DIAGNOSIS — D563 Thalassemia minor: Secondary | ICD-10-CM | POA: Diagnosis not present

## 2023-05-29 MED ORDER — SODIUM CHLORIDE 0.9 % IV SOLN: Freq: Once | INTRAVENOUS | Status: AC

## 2023-05-29 MED ORDER — SODIUM CHLORIDE 0.9 % IV SOLN
300.0000 mg | Freq: Once | INTRAVENOUS | Status: AC
  Administered 2023-05-29: 300 mg via INTRAVENOUS
  Filled 2023-05-29: qty 300

## 2023-05-29 NOTE — Patient Instructions (Signed)
Iron Sucrose Injection What is this medication? IRON SUCROSE (EYE ern SOO krose) treats low levels of iron (iron deficiency anemia) in people with kidney disease. Iron is a mineral that plays an important role in making red blood cells, which carry oxygen from your lungs to the rest of your body. This medicine may be used for other purposes; ask your health care provider or pharmacist if you have questions. COMMON BRAND NAME(S): Venofer What should I tell my care team before I take this medication? They need to know if you have any of these conditions: Anemia not caused by low iron levels Heart disease High levels of iron in the blood Kidney disease Liver disease An unusual or allergic reaction to iron, other medications, foods, dyes, or preservatives Pregnant or trying to get pregnant Breastfeeding How should I use this medication? This medication is for infusion into a vein. It is given in a hospital or clinic setting. Talk to your care team about the use of this medication in children. While this medication may be prescribed for children as young as 2 years for selected conditions, precautions do apply. Overdosage: If you think you have taken too much of this medicine contact a poison control center or emergency room at once. NOTE: This medicine is only for you. Do not share this medicine with others. What if I miss a dose? Keep appointments for follow-up doses. It is important not to miss your dose. Call your care team if you are unable to keep an appointment. What may interact with this medication? Do not take this medication with any of the following: Deferoxamine Dimercaprol Other iron products This medication may also interact with the following: Chloramphenicol Deferasirox This list may not describe all possible interactions. Give your health care provider a list of all the medicines, herbs, non-prescription drugs, or dietary supplements you use. Also tell them if you smoke,  drink alcohol, or use illegal drugs. Some items may interact with your medicine. What should I watch for while using this medication? Visit your care team regularly. Tell your care team if your symptoms do not start to get better or if they get worse. You may need blood work done while you are taking this medication. You may need to follow a special diet. Talk to your care team. Foods that contain iron include: whole grains/cereals, dried fruits, beans, or peas, leafy green vegetables, and organ meats (liver, kidney). What side effects may I notice from receiving this medication? Side effects that you should report to your care team as soon as possible: Allergic reactions--skin rash, itching, hives, swelling of the face, lips, tongue, or throat Low blood pressure--dizziness, feeling faint or lightheaded, blurry vision Shortness of breath Side effects that usually do not require medical attention (report to your care team if they continue or are bothersome): Flushing Headache Joint pain Muscle pain Nausea Pain, redness, or irritation at injection site This list may not describe all possible side effects. Call your doctor for medical advice about side effects. You may report side effects to FDA at 1-800-FDA-1088. Where should I keep my medication? This medication is given in a hospital or clinic. It will not be stored at home. NOTE: This sheet is a summary. It may not cover all possible information. If you have questions about this medicine, talk to your doctor, pharmacist, or health care provider.  2024 Elsevier/Gold Standard (2023-01-26 00:00:00)

## 2023-05-29 NOTE — Progress Notes (Signed)
A short time into his iron infusion, patient stated his arm was stinging. IV infusion stopped and new IV started. Unsure if/how much iron might have gotten into his arm. Pharmacist was called- venofer is not any kind of extravasant. Advised to use ice to treat any pain or swelling. Patient on eliquis and has discoloration- difficult to determine any discoloration except some bruising.

## 2023-06-01 ENCOUNTER — Telehealth: Payer: Self-pay

## 2023-06-01 ENCOUNTER — Telehealth: Payer: Self-pay | Admitting: *Deleted

## 2023-06-01 NOTE — Telephone Encounter (Signed)
Name: Joshua Hudson  DOB: Oct 23, 1934  MRN: [id number]  Primary Cardiologist: Armanda Magic, MD   Preoperative team, please contact this patient and set up a phone call appointment for further preoperative risk assessment. Please obtain consent and complete medication review. Thank you for your help.  I confirm that guidance regarding antiplatelet and oral anticoagulation therapy has been completed and, if necessary, noted below.  I also confirmed the patient resides in the state of [place]. As per [facility] [employer] telemedicine laws, the patient must reside in the state in which the provider is licensed.   Marcelino Duster, PA 06/01/2023, 1:21 PM [facility]

## 2023-06-01 NOTE — Telephone Encounter (Signed)
DPR ok to s/w the pt's daughter Joshua Hudson. Pt has been scheduled for tele pre op appt 06/08/23. Pt's daughter tells me the procedure is planned for 06/12/23, though clearance request faxed to our office stated TBD.   Med rec and consent are done.

## 2023-06-01 NOTE — Telephone Encounter (Signed)
Patient with diagnosis of afib on Eliquis for anticoagulation.    Procedure: ENDOSCOPY  Date of procedure: TBD   CHA2DS2-VASc Score = 4   This indicates a 4.8% annual risk of stroke. The patient's score is based upon: CHF History: 1 HTN History: 0 Diabetes History: 0 Stroke History: 0 Vascular Disease History: 1 Age Score: 2 Gender Score: 0     CrCl 25 mL/min Platelet count 168 K    Per office protocol, patient can hold Eliquis for 2 days prior to procedure.     **This guidance is not considered finalized until pre-operative APP has relayed final recommendations.**

## 2023-06-01 NOTE — Telephone Encounter (Signed)
...  Pre-operative Risk Assessment    Patient Name: Joshua Hudson  DOB: 1935/07/08 MRN: [id number]      Request for Surgical Clearance    Procedure:   ENDOSCOPY  Date of Surgery:  Clearance TBD                                 Surgeon:  DR Jacklynn Lewis Surgeon's Group or Practice Name:  [facility] Phone number:  [phone] Fax number:  [phone]   Type of Clearance Requested:   - Medical  - Pharmacy:  Hold Apixaban (Eliquis)     Type of Anesthesia:  PROPOFOL   Additional requests/questions:   LAST OV DR Ladona Ridgel 11/16/22.NEXT OV 07/24/23 DR TURNER  Signed, Renee Ramus   06/01/2023, 11:42 AM

## 2023-06-01 NOTE — Telephone Encounter (Signed)
DPR ok to s/w the pt's daughter [name]. Pt has been scheduled for tele pre op appt 06/08/23. Pt's daughter tells me the procedure is planned for 06/12/23, though clearance request faxed to our office stated TBD.   Med rec and consent are done.     Patient Consent for Virtual Visit        Joshua Hudson has provided verbal consent on 06/01/2023 for a virtual visit (video or telephone).   CONSENT FOR VIRTUAL VISIT FOR:  Joshua Hudson  By participating in this virtual visit I agree to the following:  I hereby voluntarily request, consent and authorize [facility] and its employed or contracted physicians, physician assistants, nurse practitioners or other licensed health care professionals (the Practitioner), to provide me with telemedicine health care services (the "Services") as deemed necessary by the treating Practitioner. I acknowledge and consent to receive the Services by the Practitioner via telemedicine. I understand that the telemedicine visit will involve communicating with the Practitioner through live audiovisual communication technology and the disclosure of certain medical information by electronic transmission. I acknowledge that I have been given the opportunity to request an in-person assessment or other available alternative prior to the telemedicine visit and am voluntarily participating in the telemedicine visit.  I understand that I have the right to withhold or withdraw my consent to the use of telemedicine in the course of my care at any time, without affecting my right to future care or treatment, and that the Practitioner or I may terminate the telemedicine visit at any time. I understand that I have the right to inspect all information obtained and/or recorded in the course of the telemedicine visit and may receive copies of available information for a reasonable fee.  I understand that some of the potential risks of receiving the Services via telemedicine include:   Delay or interruption in medical evaluation due to technological equipment failure or disruption; Information transmitted may not be sufficient (e.g. poor resolution of images) to allow for appropriate medical decision making by the Practitioner; and/or  In rare instances, security protocols could fail, causing a breach of personal health information.  Furthermore, I acknowledge that it is my responsibility to provide information about my medical history, conditions and care that is complete and accurate to the best of my ability. I acknowledge that Practitioner's advice, recommendations, and/or decision may be based on factors not within their control, such as incomplete or inaccurate data provided by me or distortions of diagnostic images or specimens that may result from electronic transmissions. I understand that the practice of medicine is not an exact science and that Practitioner makes no warranties or guarantees regarding treatment outcomes. I acknowledge that a copy of this consent can be made available to me via my patient portal [facility] MyChart), or I can request a printed copy by calling the office of [facility].    I understand that my insurance will be billed for this visit.   I have read or had this consent read to me. I understand the contents of this consent, which adequately explains the benefits and risks of the Services being provided via telemedicine.  I have been provided ample opportunity to ask questions regarding this consent and the Services and have had my questions answered to my satisfaction. I give my informed consent for the services to be provided through the use of telemedicine in my medical care

## 2023-06-06 ENCOUNTER — Inpatient Hospital Stay: Payer: Medicare PPO | Attending: Hematology and Oncology

## 2023-06-06 ENCOUNTER — Other Ambulatory Visit: Payer: Medicare PPO

## 2023-06-06 VITALS — BP 127/75 | HR 60 | Temp 97.9°F | Resp 16

## 2023-06-06 DIAGNOSIS — D563 Thalassemia minor: Secondary | ICD-10-CM | POA: Insufficient documentation

## 2023-06-06 DIAGNOSIS — Z7901 Long term (current) use of anticoagulants: Secondary | ICD-10-CM | POA: Diagnosis not present

## 2023-06-06 DIAGNOSIS — D638 Anemia in other chronic diseases classified elsewhere: Secondary | ICD-10-CM | POA: Insufficient documentation

## 2023-06-06 DIAGNOSIS — I4891 Unspecified atrial fibrillation: Secondary | ICD-10-CM | POA: Diagnosis not present

## 2023-06-06 MED ORDER — SODIUM CHLORIDE 0.9 % IV SOLN
300.0000 mg | Freq: Once | INTRAVENOUS | Status: AC
  Administered 2023-06-06: 300 mg via INTRAVENOUS
  Filled 2023-06-06: qty 300

## 2023-06-06 MED ORDER — SODIUM CHLORIDE 0.9 % IV SOLN: Freq: Once | INTRAVENOUS | Status: AC

## 2023-06-06 NOTE — Progress Notes (Signed)
Virtual Visit via Telephone Note   Because of Joshua Hudson's co-morbid illnesses, he is at least at moderate risk for complications without adequate follow up.  This format is felt to be most appropriate for this patient at this time.  The patient did not have access to video technology/had technical difficulties with video requiring transitioning to audio format only (telephone).  All issues noted in this document were discussed and addressed.  No physical exam could be performed with this format.  Please refer to the patient's chart for his consent to telehealth for [facility].  Evaluation Performed:  Preoperative cardiovascular risk assessment _____________   Date:  06/08/2023   Patient ID:  Joshua Hudson, DOB 12-07-1934, MRN [id number] Patient Location:  Home Provider location:   Office  Primary Care Provider:  Joya Martyr, MD Primary Cardiologist:  Armanda Magic, MD  Chief Complaint / Patient Profile   88 y.o. y/o male with a h/o severe AS s/p bioprosthetic AVR in 2009 with post-op atrial fib, subsequent valve failure with severe AI s/p valve-in-valve TAVR 04/2019, HTN, mitral regurgitation, chronic HFpEF, persistent atrial flutter s/p ablation 2021 with recurrent afib/flutter with subsequent failure to maintain NSR despite amiodarone/repeat DCCVs s/p eventual AVN ablation with MDT PPM 05/2022, anemia of chronic disease followed by heme-onc requiring prior transfusion as well as GIB 02/2022, CKD 3b, esophageal stricture s/p dilations    He is pending endoscopy by Dr. Andrez Grime with [facility] on 06/12/2023  and presents today for telephonic preoperative cardiovascular risk assessment.  History of Present Illness    Joshua Hudson is a 88 y.o. male who presents via audio/video conferencing for a telehealth visit today.  Pt was last seen in cardiology clinic on 09/29/2022 by Ronie Spies, PA.  At that time ZHEN AUGUSTA was doing well .  The patient is now pending  procedure as outlined above. Since his last visit, he limited activity due to frailty.  He is not having bleeding issues. No cardiac issues such as chest pain, DOE, or dizziness.   Past Medical History    Past Medical History:  Diagnosis Date   Achalasia 06/12/2019   Noted on EGD   Aortic insufficiency 03/24/2019   AI of bioprosthetic AVR severe 4 plus   Atrial flutter (HCC)    Chronic diastolic CHF (congestive heart failure) (HCC) 03/24/2019   Chronic kidney disease, stage 3b (HCC)    Colon polyps    s/p diverticular perforation requiring 2-stage repair   Derangement of right shoulder joint    need replacing has no use of   Diverticulosis    DJD (degenerative joint disease), lumbosacral    Dysphagia    eats soft food   Esophageal stricture    GERD (gastroesophageal reflux disease)    GIB (gastrointestinal bleeding)    Hemolytic anemia (HCC)    History of blood transfusion given with 04-15-19 surgery   History of blood transfusion 07/18/2019   History of kidney stones    passed stones   Hypertension    Mitral regurgitation    moderate   Occipital neuralgia    Pacemaker    AVN ablation + MDT PPM 2023   Pancytopenia (HCC)    Postoperative atrial fibrillation (HCC) 05/10/2015   Prosthetic valve dysfunction    Rheumatoid arthritis (HCC)    s/o long term steroids  shoulders and hands   Rotator cuff arthropathy    right   S/P aortic valve replacement with bioprosthetic valve 01/16/2008  25mm Edwards Magna perimount bovine pericardial tissue valve, model 3000   S/P valve-in-valve TAVR 04/15/2019   26 mm Edwards Sapien 3 Ultra transcatheter heart valve placed via percutaneous right transfemoral approach    SBE (subacute bacterial endocarditis) prophylaxis candidate    for dental procedures   Schatzki's ring 06/12/2019   Narrowing, Noted on EGD   Severe aortic stenosis    S/P prosthetic valve replacement w 25 mm Edwards like science percardial tissue valve,Turner - 01/2008    Thrombocytopenia (HCC)    Past Surgical History:  Procedure Laterality Date   A-FLUTTER ABLATION N/A 10/10/2019   Procedure: A-FLUTTER ABLATION;  Surgeon: Marinus Maw, MD;  Location: [facility] LAB;  Service: Cardiovascular;  Laterality: N/A;   APPENDECTOMY     AV NODE ABLATION N/A 05/12/2022   Procedure: AV NODE ABLATION;  Surgeon: Marinus Maw, MD;  Location: [facility];  Service: Cardiovascular;  Laterality: N/A;   BALLOON DILATION N/A 06/12/2019   Procedure: BALLOON DILATION;  Surgeon: Kerin Salen, MD;  Location: [facility] ENDOSCOPY;  Service: Gastroenterology;  Laterality: N/A;   BOTOX INJECTION N/A 01/22/2018   Procedure: BOTOX INJECTION;  Surgeon: Kerin Salen, MD;  Location: [facility] ENDOSCOPY;  Service: Gastroenterology;  Laterality: N/A;   CARDIAC CATHETERIZATION     09   CARDIAC VALVE REPLACEMENT  01/2008   aortic valve replacement   CARDIOVERSION N/A 09/25/2019   Procedure: CARDIOVERSION;  Surgeon: Jake Bathe, MD;  Location: [facility] ENDOSCOPY;  Service: Cardiovascular;  Laterality: N/A;   CARDIOVERSION N/A 12/25/2019   Procedure: CARDIOVERSION;  Surgeon: Lewayne Bunting, MD;  Location: [facility]OSCOPY;  Service: Cardiovascular;  Laterality: N/A;   CARDIOVERSION N/A 03/08/2022   Procedure: CARDIOVERSION;  Surgeon: Meriam Sprague, MD;  Location: [facility] ENDOSCOPY;  Service: Cardiovascular;  Laterality: N/A;   CARDIOVERSION N/A 03/28/2022   Procedure: CARDIOVERSION;  Surgeon: Thomasene Ripple, DO;  Location: [facility] ENDOSCOPY;  Service: Cardiovascular;  Laterality: N/A;   CATARACT EXTRACTION W/ INTRAOCULAR LENS  IMPLANT, BILATERAL     COLON RESECTION     diverticulitis    COLONOSCOPY N/A 03/03/2022   Procedure: COLONOSCOPY;  Surgeon: Kathi Der, MD;  Location: [facility] ENDOS[facility];  Service: Gastroenterology;  Laterality: N/A;   CORONARY ANGIOPLASTY     dental implants     permanent   ENTEROSCOPY N/A 03/03/2022   Procedure: ENTEROSCOPY;  Surgeon: Kathi Der, MD;  Location: [facility]OS[facility];   Service: Gastroenterology;  Laterality: N/A;   ESOPHAGEAL MANOMETRY N/A 11/07/2017   Procedure: ESOPHAGEAL MANOMETRY (EM);  Surgeon: Kerin Salen, MD;  Location: [facility];  Service: Gastroenterology;  Laterality: N/A;   ESOPHAGOGASTRODUODENOSCOPY N/A 02/24/2022   Procedure: ESOPHAGOGASTRODUODENOSCOPY (EGD);  Surgeon: Willis Modena, MD;  Location: [facility] ENDOSCOPY;  Service: Gastroenterology;  Laterality: N/A;   ESOPHAGOGASTRODUODENOSCOPY (EGD) WITH PROPOFOL N/A 01/22/2018   Procedure: ESOPHAGOGASTRODUODENOSCOPY (EGD) WITH PROPOFOL;  Surgeon: Kerin Salen, MD;  [facility];  Service: Gastroenterology;  Laterality: N/A;   ESOPHAGOGASTRODUODENOSCOPY (EGD) WITH PROPOFOL N/A 04/30/2019   Procedure: ESOPHAGOGASTRODUODENOSCOPY (EGD) WITH PROPOFOL;  Surgeon: Carman Ching, MD;  Location: [facility] ENDOSCOPY;  Service: Endoscopy;  Laterality: N/A;   ESOPHAGOGASTRODUODENOSCOPY (EGD) WITH PROPOFOL N/A 06/12/2019   Procedure: ESOPHAGOGASTRODUODENOSCOPY (EGD) WITH PROPOFOL;  Surgeon: Kerin Salen, MD;  Location[facility];  Service: Gastroenterology;  Laterality: N/A;  with botox injection   EXCISIONAL TOTAL SHOULDER ARTHROPLASTY WITH ANTIBIOTIC SPACER Right 12/31/2018   Procedure: EXCISIONAL TOTAL SHOULDER ARTHROPLASTY WITH ANTIBIOTIC SPACER;  Surgeon: Beverely Low, MD;  Location: [facility] OR;  Service: Orthopedics;  Laterality: Right;   GIVENS CAPSULE STUDY N/A 03/01/2022   Procedure: GIVENS CAPSULE STUDY;  Surgeon: Kathi Der, MD;  Location: [facility] ENDOSCOPY;  Service: Gastroenterology;  Laterality: N/A;   HEMOSTASIS CLIP PLACEMENT  03/03/2022   Procedure: HEMOSTASIS CLIP PLACEMENT;  Surgeon: Kathi Der, MD;  Location: [facility] ENDOS[facility];  Service: Gastroenterology;;   HERNIA REPAIR     HOT HEMOSTASIS N/A 03/03/2022   Procedure: HOT HEMOSTASIS (ARGON PLASMA COAGULATION/BICAP);  Surgeon: Kathi Der, MD;  Location: [facility] ENDOSCOPY;  Service: Gastroenterology;  Laterality: N/A;  EGD and COLON    INTRAOPERATIVE TRANSTHORACIC ECHOCARDIOGRAM N/A 04/15/2019   Procedure: Intraoperative Transthoracic Echocardiogram;  Surgeon: Tonny Bollman, MD;  Location: [facility] OR;  Service: Open Heart Surgery;  Laterality: N/A;   IRRIGATION AND DEBRIDEMENT SHOULDER Right 11/20/2018    IRRIGATION AND DEBRIDEMENT SHOULDER WITH POLY EXCHANGE (Right Shoulder)   IRRIGATION AND DEBRIDEMENT SHOULDER Right 11/20/2018   Procedure: IRRIGATION AND DEBRIDEMENT SHOULDER WITH POLY EXCHANGE;  Surgeon: Beverely Low, MD;  Location: [facility] OR;  Service: Orthopedics;  Laterality: Right;   LUMBAR LAMINECTOMY     x 2   PACEMAKER IMPLANT N/A 05/12/2022   Procedure: PACEMAKER IMPLANT;  Surgeon: Marinus Maw, MD;  [facility];  Service: Cardiovascular;  Laterality: N/A;   POLYPECTOMY  03/03/2022   Procedure: POLYPECTOMY;  Surgeon: Kathi Der, MD;  Location[facility] ENDOSCOPY;  Service: Gastroenterology;;   REVERSE SHOULDER ARTHROPLASTY Right 03/01/2018   Procedure: RIGHT REVERSE SHOULDER ARTHROPLASTY;  Surgeon: Beverely Low, MD;  Location: [facility] OR;  Service: Orthopedics;  Laterality: Right;   REVERSE SHOULDER ARTHROPLASTY Right 07/18/2019   Procedure: REVERSE TOTAL SHOULDER ARTHROPLASTY and removal of antiobotic spacer;  Surgeon: Beverely Low, MD;  Location: [facility] ORS;  Service: Orthopedics;  Laterality: Right;  interscalene block   REVERSE SHOULDER ARTHROPLASTY Right 08/06/2019   Procedure: Reduction of dislocated reverse total shoulder and poly exchange;  Surgeon: Beverely Low, MD;  Location: [facility] ORS;  Service: Orthopedics;  Laterality: Right;  need 1 hour   RIGHT/LEFT HEART CATH AND CORONARY ANGIOGRAPHY N/A 04/02/2019   Procedure: RIGHT/LEFT HEART CATH AND CORONARY ANGIOGRAPHY;  Surgeon: Marykay Lex, MD;  Location: [facility] INVASIVE CV LAB;  Service: Cardiovascular;  Laterality: N/A;   SAVORY DILATION N/A 01/22/2018   Procedure: SAVORY DILATION;  Surgeon: Kerin Salen, MD;  Location: [facility] ENDOS[facility];  Service: Gastroenterology;   Laterality: N/A;   SAVORY DILATION N/A 04/30/2019   Procedure: SAVORY DILATION;  Surgeon: Carman Ching, MD;  Location: [facility] ENDOSCOPY;  Service: Endoscopy;  Laterality: N/A;  With fluro   SHOULDER HEMI-ARTHROPLASTY Right 06/14/2018   Procedure: RIGHT  REVERSE TOTAL SHOULDER OPEN POLY EXCHANGE;  Surgeon: Beverely Low, MD;  Location: [facility] OR;  Service: Orthopedics;  Laterality: Right;   SUBMUCOSAL INJECTION  06/12/2019   Procedure: SUBMUCOSAL INJECTION;  Surgeon: Kerin Salen, MD;  Location[facility] ENDOS[facility];  Service: Gastroenterology;;   TEE WITHOUT CARDIOVERSION N/A 01/29/2014   Procedure: TRANSESOPHAGEAL ECHOCARDIOGRAM (TEE);  Surgeon: Quintella Reichert, MD;  Location: [facility] ENDOSCOPY;  Service: Cardiovascular;  Laterality: N/A;   TEE WITHOUT CARDIOVERSION N/A 04/02/2019   Procedure: TRANSESOPHAGEAL ECHOCARDIOGRAM (TEE);  Surgeon: Wendall Stade, MD;  Location: [facility] ENDOSCOPY;  Service: Cardiovascular;  Laterality: N/A;   TEE WITHOUT CARDIOVERSION N/A 09/25/2019   Procedure: TRANSESOPHAGEAL ECHOCARDIOGRAM (TEE);  Surgeon: Jake Bathe, MD;  Location: [facility] ENDOSCOPY;  Service: Cardiovascular;  Laterality: N/A;   TEE WITHOUT CARDIOVERSION N/A 03/08/2022   Procedure: TRANSESOPHAGEAL ECHOCARDIOGRAM (TEE);  Surgeon: Meriam Sprague, MD;  Location: [facility] ENDOSCOPY;  Service: Cardiovascular;  Laterality: N/A;   TONSILLECTOMY     TRANSCATHETER AORTIC VALVE REPLACEMENT, TRANSFEMORAL  04/15/2019   TRANSCATHETER AORTIC VALVE REPLACEMENT, TRANSFEMORAL N/A 04/15/2019   Procedure: TRANSCATHETER AORTIC VALVE REPLACEMENT, TRANSFEMORAL with POST BALLOON DILATION;  Surgeon: Tonny Bollman, MD;  Location: [facility] OR;  Service: Open Heart Surgery;  Laterality: N/A;    Allergies  No Known Allergies  Home Medications    Prior to Admission medications   Medication Sig Start Date End Date Taking? Authorizing Provider  cholestyramine (QUESTRAN) 4 g packet Take 4 g by mouth daily. 05/02/22   [provider]  CREON  24000-76000 units CPEP as directed Orally three times a day 10/09/22   [provider]  ELIQUIS 2.5 MG TABS tablet TAKE 1 TABLET(2.5 MG) BY MOUTH TWICE DAILY 04/23/23   Marinus Maw, MD  epoetin alfa-epbx (RETACRIT) 16109 UNIT/ML injection Inject 40,000 Units into the skin every 6 (six) weeks. As needed    [provider]  finasteride (PROSCAR) 5 MG tablet Take 5 mg by mouth daily. 06/26/19   [provider]  fluticasone (FLONASE) 50 MCG/ACT nasal spray Place 1 spray into both nostrils daily as needed for allergies or rhinitis.    [provider]  JARDIANCE 10 MG TABS tablet TAKE 1 TABLET(10 MG) BY MOUTH DAILY 05/21/23   Swinyer, Zachary George, NP  ketotifen (ZADITOR) 0.025 % ophthalmic solution Place 1 drop into both eyes 2 (two) times daily as needed (itchy eyes).    [provider]  levothyroxine (SYNTHROID) 88 MCG tablet Take 88 mcg by mouth every morning. 02/06/22   [provider]  oxyCODONE (ROXICODONE) 5 MG immediate release tablet Take 1 tablet (5 mg total) by mouth every 4 (four) hours as needed for severe pain. 08/22/22   Dione Booze, MD  pantoprazole (PROTONIX) 40 MG tablet Take 1 tablet (40 mg total) by mouth 2 (two) times daily before a meal for 30 days, THEN 1 tablet (40 mg total) daily. 02/25/22 05/26/22  Almon Hercules, MD  potassium chloride SA (KLOR-CON M) 20 MEQ tablet TAKE 1 TABLET(20 MEQ) BY MOUTH EVERY OTHER DAY 03/29/23   Marinus Maw, MD  predniSONE (DELTASONE) 5 MG tablet Take 5 mg by mouth daily with breakfast. TAKING 15mg  QD FOR 3Wks    [provider]  torsemide (DEMADEX) 20 MG tablet Take 40 mg (2  tablets) by mouth in the AM and 20 mg (1 tablet) by mouth in the PM 09/29/22   Laurann Montana, PA-C    Physical Exam    Vital Signs:  Stasia Cavalier does not have vital signs available for review today.  Given telephonic nature of communication, physical exam is limited. AAOx3. NAD. Normal affect.  Speech and  respirations are unlabored.  Accessory Clinical Findings    None  Assessment & Plan    1.  Preoperative Cardiovascular Risk Assessment:  According to the Revised Cardiac Risk Index (RCRI), his Perioperative Risk of Major Cardiac Event is (%): 0.4  His Functional Capacity in METs is: 5.07 according to the [facility] Activity Status Index (DASI).   The patient was advised that if he develops new symptoms prior to surgery to contact our office to arrange for a follow-up visit, and he verbalized understanding.    CHA2DS2-VASc Score = 4   This indicates a 4.8% annual risk of stroke. The patient's score is based upon: CHF History: 1 HTN History: 0 Diabetes History: 0 Stroke History: 0 Vascular Disease History: 1  Age Score: 2 Gender Score: 0   CrCl 25 mL/min Platelet count 168 K    Per office protocol, patient can hold Eliquis for 2 days prior to procedure.    Therefore, based on ACC/AHA guidelines, patient would be at acceptable risk for the planned procedure without further cardiovascular testing. I will route this recommendation to the requesting party via Epic fax function.   A copy of this note will be routed to requesting surgeon.  Time:   Today, I have spent 10 minutes with the patient with telehealth technology discussing medical history, symptoms, and management plan.     Joni Reining, NP  06/08/2023, 9:06 AM

## 2023-06-06 NOTE — Patient Instructions (Signed)
Iron Sucrose Injection What is this medication? IRON SUCROSE (EYE ern SOO krose) treats low levels of iron (iron deficiency anemia) in people with kidney disease. Iron is a mineral that plays an important role in making red blood cells, which carry oxygen from your lungs to the rest of your body. This medicine may be used for other purposes; ask your health care provider or pharmacist if you have questions. COMMON BRAND NAME(S): Venofer What should I tell my care team before I take this medication? They need to know if you have any of these conditions: Anemia not caused by low iron levels Heart disease High levels of iron in the blood Kidney disease Liver disease An unusual or allergic reaction to iron, other medications, foods, dyes, or preservatives Pregnant or trying to get pregnant Breastfeeding How should I use this medication? This medication is for infusion into a vein. It is given in a hospital or clinic setting. Talk to your care team about the use of this medication in children. While this medication may be prescribed for children as young as 2 years for selected conditions, precautions do apply. Overdosage: If you think you have taken too much of this medicine contact a poison control center or emergency room at once. NOTE: This medicine is only for you. Do not share this medicine with others. What if I miss a dose? Keep appointments for follow-up doses. It is important not to miss your dose. Call your care team if you are unable to keep an appointment. What may interact with this medication? Do not take this medication with any of the following: Deferoxamine Dimercaprol Other iron products This medication may also interact with the following: Chloramphenicol Deferasirox This list may not describe all possible interactions. Give your health care provider a list of all the medicines, herbs, non-prescription drugs, or dietary supplements you use. Also tell them if you smoke,  drink alcohol, or use illegal drugs. Some items may interact with your medicine. What should I watch for while using this medication? Visit your care team regularly. Tell your care team if your symptoms do not start to get better or if they get worse. You may need blood work done while you are taking this medication. You may need to follow a special diet. Talk to your care team. Foods that contain iron include: whole grains/cereals, dried fruits, beans, or peas, leafy green vegetables, and organ meats (liver, kidney). What side effects may I notice from receiving this medication? Side effects that you should report to your care team as soon as possible: Allergic reactions--skin rash, itching, hives, swelling of the face, lips, tongue, or throat Low blood pressure--dizziness, feeling faint or lightheaded, blurry vision Shortness of breath Side effects that usually do not require medical attention (report to your care team if they continue or are bothersome): Flushing Headache Joint pain Muscle pain Nausea Pain, redness, or irritation at injection site This list may not describe all possible side effects. Call your doctor for medical advice about side effects. You may report side effects to FDA at 1-800-FDA-1088. Where should I keep my medication? This medication is given in a hospital or clinic. It will not be stored at home. NOTE: This sheet is a summary. It may not cover all possible information. If you have questions about this medicine, talk to your doctor, pharmacist, or health care provider.  2024 Elsevier/Gold Standard (2023-01-26 00:00:00)

## 2023-06-06 NOTE — Progress Notes (Signed)
Patient declined post iron observation.  Tolerated treatment well without incident.  VSS at discharge. Ambulated to lobby.

## 2023-06-07 ENCOUNTER — Other Ambulatory Visit: Payer: Self-pay | Admitting: Gastroenterology

## 2023-06-08 ENCOUNTER — Ambulatory Visit: Payer: Medicare PPO | Attending: Cardiology

## 2023-06-08 ENCOUNTER — Other Ambulatory Visit: Payer: Self-pay | Admitting: *Deleted

## 2023-06-08 DIAGNOSIS — Z01818 Encounter for other preprocedural examination: Secondary | ICD-10-CM

## 2023-06-08 DIAGNOSIS — Z01810 Encounter for preprocedural cardiovascular examination: Secondary | ICD-10-CM

## 2023-06-08 NOTE — Telephone Encounter (Signed)
Pt was cleared today per Joni Reining, DNP. I will fax over the clearance notes to requesting office.

## 2023-06-08 NOTE — Telephone Encounter (Signed)
CORRECTION ON SPELLING OF SURGEON's NAME: DR. Kerin Salen

## 2023-06-11 ENCOUNTER — Encounter (HOSPITAL_COMMUNITY): Payer: Self-pay | Admitting: Gastroenterology

## 2023-06-11 NOTE — H&P (Signed)
History of Present Illness    General:  88 year old male with probable achalasia-barium swallow showed smooth tapering of distal esophagus with esophageal dysmotility in 09/2010 (EGD 06/2019, balloon dilation with 20 mm, low-grade Schatzki's ring, 100 units of Botox injected) EGD 03/03/2022: AVMs in duodenum and jejunum Colonoscopy 03/03/2022: Nonbleeding AVMs, internal hemorrhoids, 3 mm ascending polyp removed, fair prep, skin tags Small bowel enteroscopy 03/03/2022: AVMs in duodenum and jejunum treated with APC Capsule endoscopy 03/02/2022: Capsule stayed in the stomach for 8 hours of study History of anemia of chronic disease, beta thalassemia minor History of A-fib with RVR, status post cardioversion, on Eliquis, has had ablation and pacemaker implantation Labs 05/24/2023: Iron saturation 10%, ferritin 17, TIBC 351, hemoglobin 9.9, MCV 68.2, platelet 168  He is constantly clearing his throat, he feels there is something in his throat and cannot swallow. He is eating normal, but his symptoms started 2-3 days ago. In this past week, he has eaten sphaghetti, pop tarts, chicken pie, it goes down, but "nothign goes down like it used". No regurgitation. He feels there is something in his throat all the time, even when not eating, but he has more trouble with solids than liquids. He has acid reflux.He takes pantoprazole 40 mg a day. He was noted to low fecal pancreatic elastase and was stared on cholestyramine as needed, may be once a month. He takes creon three times a day. He was 99 lbs in 05/2022, today he is 108 lbs. Denies blood in stool or black stools. He is getting periodic IV iron infusions and blood transfusion if needed. He has a lot of fatigue, feels SOB on exertion, denies chest pain, denies dizziness. Denies abdominal pain.  Current Medications Apixaban 2.5 MG Tablet 1 tablet Orally twice a day , Notes to Pharmacist: Restart 03/04/22 Cholestyramine 4 GM Packet MIX AND DRINK 1 PACKET BY  MOUTH EVERY DAY. MIX WITH WATER OR NON CARBONATED DRINK Creon(Pancrelipase (Lip-Prot-Amyl)) 24000-76000 UNIT Capsule Delayed Release Particles as directed Orally three times a day with first bite of meal Levothyroxine Sodium 88 MCG Tablet TAKE 1 TABLET BY MOUTH EVERY DAY EVERY MORNING ON AN EMPTY STOMACH Orally Once a day Molnupiravir 200 MG Capsule 4 capsules Orally every 12 hrs , Notes to Pharmacist: PLEASE CANCEL RX SENT UNDER DR Jonny Ruiz GRIFFIN Pantoprazole Sodium 40 MG Tablet Delayed Release 1 tablet Orally Once a day Potassium Chloride ER 20 MEQ Tablet Extended Release 1 tablet Orally Once a day , Notes to Pharmacist: Increased by hospital Retacrit(Epoetin Alfa-epbx) 2000 UNIT/ML Solution inject 40,000 units into skin Injection Every 6 weeks predniSONE 5MG  Tablet 1 tablet with breakfast Orally Once a day Finasteride 5 MG Tablet 1 tablet Orally Once a day, as needed Zaditor(Ketotifen Fumarate) 0.025 % Solution 1 drop into both eyes Ophthalmic Twice a day as needed (itchy eyes) Theratears(Carboxymethylcellulose Sodium) 0.25 % Solution as directed Ophthalmic Fluticasone Propionate 50 MCG/ACT Suspension 1 spray in each nostril Nasally Once a day  Past Medical History severe aortic stenosis status post prosthetic valve replacement with 25 mm Edwards like science pericardial tissue valve, Turner. Congestive heart failure with combined systolic and diastolic dysfunction, echo 2021 EF 45-50% and grade 2 diastolic dysfunction. atrial fibrillation postoperatively without recurrence, Turner. Atrial flutter, ablation 2021 on amiodarone. Degenerative joint disease of the lumbosacral spine. rheumatoid arthritis-s/p long term steroids, PLQ--d/c ? eye toxicity, methotrexate possible anemia stopped , [place]. Diverticulosis. Occipital neuralgia. Colon polyps, last colonoscopy in 2004. Status post diverticular perforation requiring 2-stage repair. SBE prophylaxis dental procedures. Erectile  dysfunction. Herpes keratitis os 12/15 [facility]. esophageal stricture, EGD 2015, EGD and dilation 2020. achalasia, 2020, Botox injections Kaitland Lewellyn. bronchiectasis on CT scan, asymptomatic, 2015. right cervical radiculopathy, December 2015. cecal AVMs. Osteopenia. Chronic microcytic anemia, beta thalassemia. Beta thalassemia. GI bleeding 6/23, 2 admissions, first admission upper endoscopy gastritis start a PPI. Continue melena second admission, capsule endoscopy angioedema to [place] and duodenum, treated argon coagulation. Ophtho Dolan/patel, cardiology [facility], rheumatology [place], [facility], pulm clance(released), ennever oncology heme gudena. Pacemaker placed 2023. Blood infusions every 3 weeks at  cancer center.  Surgical History C3-C4 cervical fusion 1995 appendectomy tonsillectomy TURP 1991 cystoscopy with laser incision of the bladder neck 1992 kidney stone removal 1997 surgery on cervical spine and right a.c. joint temporary colostomy following colonic perforation from diverticular perforation with re-anastomosis 2002 hernia repair, Derrell Lolling 2005 aortic valve replacement with prosthetic aortic valve (Edwards like science pericardial tissue valve) 5/09 colonoscopy/endoscopy 2015 endoscopy 10/25/17 Right reverse shoulder arthroplasty- Dr. Ranell Patrick 02/2018 irrigation and debridement right shoulder, Norris March 2020 r shoulder implant removal, debridement and irrigation, antibiotic spacer, norris 4/20 transcatheter aortic valve replacement, owen and cooper 8/20 right reverse TSA and removal of antiobotic spacer- Dr. Ranell Patrick 07/2019 R shoulder surgery 07/2019 right dislocated reverse shoulder replacement, Norris December 2020 atrial flutter ablation, 2021 2021 Capsule endoscopy 03/02/2022 Colonoscopy No Repeat 03/03/2022 Small bowel enterscopy 03/03/2022 Pacemaker placed 2023  Family History Father: deceased 35 yrs, MI, rheumatoid arthritis Mother: deceased 70 yrs,  Dementia Brother 1: deceased, suicide Brother2: alive Brother 3: alive Sister 1: deceased, Status post cardiac transplant from automobile accident Sister 2: deceased, diagnosed with Diabetes Sister 3: alive Daughter(s): Rheumatoid arthritis 3 brother(s) , 5 sister(s) . 2 daughter(s) - healthy. No Family History of Colon Cancer, Polyps, or Liver Disease.  Social History    General:  Tobacco use  cigarettes: Former smoker Quit in year 1980 smoked a couple cigs/day for 8-10 years Pack-year Hx: 2 Tobacco history last updated 06/01/2023 Additional Findings: Tobacco Non-User Ex-light cigarette smoker (1-9/day) Vaping No Alcohol: no. Caffeine: yes. Recreational drug use: no. Exercise: walk almost daily1/2 mile. Marital Status: widowed 1/21. 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  Allergies N.K.D.A. Hospitalization/Major Diagnostic Procedure aortic valve replacement 04/2019 R shoulder surgery 07/2019 Hospitalization due to internal bleeding 06-03/2022 Pacemaker placed 2023 None in the past year 05/2023  Vital Signs Wt: 108.4, Wt change: 0 lbs, Ht: 61, BMI: 20.48, Temp: 97.9, Pulse sitting: 60, BP sitting: 129/81. Examination GENERAL APPEARANCE:  alert, elderly, frail, pallor.  SCLERA:  anicteric.  CARDIOVASCULAR  Normal RRR .  RESPIRATORY  Breath sounds normal. Respiration even and unlabored.  ABDOMEN  No masses palpated. Liver and spleen not palpated, normal. Bowel sounds normal, Abdomen not distended.  EXTREMITIES:  No edema.  NEURO:  alert, oriented to time, place and person, normal gait.  PSYCH:  mood/affect normal.  Assessments 1. Esophageal dysphagia - R13.19 (Primary)    2. Iron deficiency anemia due to chronic blood loss - D50.0    3. On apixaban therapy - Z79.01    Treatment 1. Esophageal dysphagia      IMAGING: EGD w/ Directed Submucosal Injection(s) (Ordered for 06/01/2023)   IMAGING: EGD w/ DILATATION (Ordered for 06/01/2023)      Notes: Recommend proceeding with EGD with Botox injection and possible balloon dilation at the hospital. Will discuss with his cardiologist about holding Eliquis at least for 1 day prior to procedure and getting cardiac clearance.   2. Iron deficiency anemia due to chronic  blood loss        Notes: Patient known to have multiple AVMs, is being seen by hematology and gets periodic IV iron infusions, blood transfusions as needed.   3. On apixaban therapy        Notes: This will have to be on hold at least for 1 day prior to procedure to decrease risk of bleeding with dilation and Botox injection.

## 2023-06-12 ENCOUNTER — Ambulatory Visit (HOSPITAL_COMMUNITY)
Admission: RE | Admit: 2023-06-12 | Discharge: 2023-06-12 | Disposition: A | Discharge status: Home / Self Care | Payer: Medicare PPO | Attending: Gastroenterology | Admitting: Gastroenterology

## 2023-06-12 ENCOUNTER — Other Ambulatory Visit: Payer: Self-pay

## 2023-06-12 ENCOUNTER — Ambulatory Visit (HOSPITAL_BASED_OUTPATIENT_CLINIC_OR_DEPARTMENT_OTHER): Payer: Medicare PPO | Admitting: Certified Registered Nurse Anesthetist

## 2023-06-12 ENCOUNTER — Ambulatory Visit (HOSPITAL_COMMUNITY): Payer: Self-pay | Admitting: Certified Registered Nurse Anesthetist

## 2023-06-12 ENCOUNTER — Encounter (HOSPITAL_COMMUNITY)
Admission: RE | Disposition: A | Discharge status: Home / Self Care | Payer: Self-pay | Source: Home / Self Care | Attending: Gastroenterology

## 2023-06-12 ENCOUNTER — Encounter (HOSPITAL_COMMUNITY): Payer: Self-pay | Admitting: Gastroenterology

## 2023-06-12 DIAGNOSIS — K219 Gastro-esophageal reflux disease without esophagitis: Secondary | ICD-10-CM | POA: Diagnosis not present

## 2023-06-12 DIAGNOSIS — D638 Anemia in other chronic diseases classified elsewhere: Secondary | ICD-10-CM | POA: Diagnosis not present

## 2023-06-12 DIAGNOSIS — K2289 Other specified disease of esophagus: Secondary | ICD-10-CM | POA: Insufficient documentation

## 2023-06-12 DIAGNOSIS — Z79899 Other long term (current) drug therapy: Secondary | ICD-10-CM | POA: Insufficient documentation

## 2023-06-12 DIAGNOSIS — E039 Hypothyroidism, unspecified: Secondary | ICD-10-CM

## 2023-06-12 DIAGNOSIS — K3189 Other diseases of stomach and duodenum: Secondary | ICD-10-CM | POA: Insufficient documentation

## 2023-06-12 DIAGNOSIS — D563 Thalassemia minor: Secondary | ICD-10-CM | POA: Diagnosis not present

## 2023-06-12 DIAGNOSIS — Z950 Presence of cardiac pacemaker: Secondary | ICD-10-CM | POA: Insufficient documentation

## 2023-06-12 DIAGNOSIS — K220 Achalasia of cardia: Secondary | ICD-10-CM | POA: Insufficient documentation

## 2023-06-12 DIAGNOSIS — R1314 Dysphagia, pharyngoesophageal phase: Secondary | ICD-10-CM | POA: Diagnosis present

## 2023-06-12 DIAGNOSIS — Z7901 Long term (current) use of anticoagulants: Secondary | ICD-10-CM | POA: Insufficient documentation

## 2023-06-12 DIAGNOSIS — Z87891 Personal history of nicotine dependence: Secondary | ICD-10-CM | POA: Insufficient documentation

## 2023-06-12 DIAGNOSIS — I482 Chronic atrial fibrillation, unspecified: Secondary | ICD-10-CM | POA: Diagnosis not present

## 2023-06-12 DIAGNOSIS — K222 Esophageal obstruction: Secondary | ICD-10-CM | POA: Diagnosis not present

## 2023-06-12 HISTORY — PX: ESOPHAGOGASTRODUODENOSCOPY (EGD) WITH PROPOFOL: SHX5813

## 2023-06-12 HISTORY — PX: BIOPSY: SHX5522

## 2023-06-12 HISTORY — PX: BOTOX INJECTION: SHX5754

## 2023-06-12 SURGERY — ESOPHAGOGASTRODUODENOSCOPY (EGD) WITH PROPOFOL
Anesthesia: Monitor Anesthesia Care

## 2023-06-12 MED ORDER — SODIUM CHLORIDE (PF) 0.9 % IJ SOLN
INTRAMUSCULAR | Status: AC
  Filled 2023-06-12: qty 10

## 2023-06-12 MED ORDER — LIDOCAINE 2% (20 MG/ML) 5 ML SYRINGE
INTRAMUSCULAR | Status: DC | PRN
  Administered 2023-06-12: 50 mg via INTRAVENOUS

## 2023-06-12 MED ORDER — POLYMYXIN B-TRIMETHOPRIM 10000-0.1 UNIT/ML-% OP SOLN
1.0000 [drp] | Freq: Four times a day (QID) | OPHTHALMIC | Status: DC
  Administered 2023-06-12: 1 [drp] via OPHTHALMIC
  Filled 2023-06-12: qty 10

## 2023-06-12 MED ORDER — BSS IO SOLN
15.0000 mL | Freq: Once | INTRAOCULAR | Status: DC
  Filled 2023-06-12: qty 15

## 2023-06-12 MED ORDER — PROPOFOL 500 MG/50ML IV EMUL
INTRAVENOUS | Status: AC
  Filled 2023-06-12: qty 50

## 2023-06-12 MED ORDER — PROPOFOL 10 MG/ML IV BOLUS
INTRAVENOUS | Status: DC | PRN
  Administered 2023-06-12: 20 mg via INTRAVENOUS

## 2023-06-12 MED ORDER — SODIUM CHLORIDE (PF) 0.9 % IJ SOLN
INTRAMUSCULAR | Status: DC | PRN
  Administered 2023-06-12: 4 mL via SUBMUCOSAL

## 2023-06-12 MED ORDER — LACTATED RINGERS IV SOLN: INTRAVENOUS | Status: DC

## 2023-06-12 MED ORDER — SODIUM CHLORIDE 0.9 % IV SOLN: INTRAVENOUS | Status: DC

## 2023-06-12 MED ORDER — ONABOTULINUMTOXINA 100 UNITS IJ SOLR
INTRAMUSCULAR | Status: AC
  Filled 2023-06-12: qty 100

## 2023-06-12 MED ORDER — KETOROLAC TROMETHAMINE 0.5 % OP SOLN
1.0000 [drp] | Freq: Four times a day (QID) | OPHTHALMIC | Status: DC
  Administered 2023-06-12: 1 [drp] via OPHTHALMIC
  Filled 2023-06-12: qty 5

## 2023-06-12 MED ORDER — PROPOFOL 500 MG/50ML IV EMUL
INTRAVENOUS | Status: DC | PRN
  Administered 2023-06-12: 125 ug/kg/min via INTRAVENOUS

## 2023-06-12 SURGICAL SUPPLY — 15 items

## 2023-06-12 NOTE — Anesthesia Preprocedure Evaluation (Addendum)
Anesthesia Evaluation  Patient identified by MRN, date of birth, ID band Patient awake    Reviewed: Allergy & Precautions, H&P , NPO status , Patient's Chart, lab work & pertinent test results  Airway Mallampati: III  TM Distance: >3 FB Neck ROM: Full    Dental no notable dental hx. (+) Teeth Intact, Dental Advisory Given   Pulmonary former smoker   Pulmonary exam normal breath sounds clear to auscultation       Cardiovascular hypertension, +CHF  + pacemaker + Valvular Problems/Murmurs MR  Rhythm:Regular Rate:Normal     Neuro/Psych  Headaches negative neurological ROS  negative psych ROS   GI/Hepatic Neg liver ROS,GERD  ,,  Endo/Other  Hypothyroidism    Renal/GU Renal InsufficiencyRenal disease  negative genitourinary   Musculoskeletal  (+) Arthritis , Osteoarthritis,    Abdominal   Peds  Hematology  (+) Blood dyscrasia, anemia   Anesthesia Other Findings   Reproductive/Obstetrics negative OB ROS                             Anesthesia Physical Anesthesia Plan  ASA: 3  Anesthesia Plan: MAC   Post-op Pain Management: Minimal or no pain anticipated   Induction: Intravenous  PONV Risk Score and Plan: 1 and Propofol infusion  Airway Management Planned: Natural Airway and Simple Face Mask  Additional Equipment:   Intra-op Plan:   Post-operative Plan:   Informed Consent: I have reviewed the patients History and Physical, chart, labs and discussed the procedure including the risks, benefits and alternatives for the proposed anesthesia with the patient or authorized representative who has indicated his/her understanding and acceptance.     Dental advisory given  Plan Discussed with: CRNA  Anesthesia Plan Comments:        Anesthesia Quick Evaluation

## 2023-06-12 NOTE — Anesthesia Postprocedure Evaluation (Signed)
Anesthesia Post Note  Patient: Joshua Hudson  Procedure(s) Performed: ESOPHAGOGASTRODUODENOSCOPY (EGD) WITH PROPOFOL Balloon dilation wire-guided BOTOX INJECTION BIOPSY     Patient location during evaluation: Endoscopy Anesthesia Type: MAC Level of consciousness: awake and alert Pain management: pain level controlled Vital Signs Assessment: post-procedure vital signs reviewed and stable Respiratory status: spontaneous breathing, nonlabored ventilation and respiratory function stable Cardiovascular status: stable and blood pressure returned to baseline Postop Assessment: no apparent nausea or vomiting Anesthetic complications: yes (Corneal abrasion) Comments: Pt with left eye pain in pacu. Started the corneal abrasion protocol. Pt has an eye doctor that he can see if drops do not help.  No notable events documented.  Last Vitals:  Vitals:   06/12/23 1310 06/12/23 1320  BP: (!) 149/66 (!) 154/72  Pulse: (!) 59 61  Resp: 14 14  Temp:    SpO2: 100% 100%    Last Pain:  Vitals:   06/12/23 1300  TempSrc:   PainSc: 7                  Malyssa Maris,W. EDMOND

## 2023-06-12 NOTE — Discharge Instructions (Signed)

## 2023-06-12 NOTE — Op Note (Signed)
[facility] Patient Name: Joshua Hudson Procedure Date: 06/12/2023 MRN: [id number] Attending MD: Kerin Salen , MD, [id number] Date of Birth: February 14, 1935 CSN: [id number] Age: 88 Admit Type: Outpatient Procedure:                Upper GI endoscopy Indications:              Dysphagia, For botulinum toxin injection of                            achalasia, For balloon dilation of achalasia Providers:                Kerin Salen, MD, Suzy Bouchard, RN, Fransisca Connors,                            Beryle Beams, Technician, Geoffery Lyons,                            Technician Referring MD:             Pearline Cables, MD Medicines:                Monitored Anesthesia Care Complications:            No immediate complications. Estimated Blood Loss:     Estimated blood loss: none. Procedure:                Pre-Anesthesia Assessment:                           - Prior to the procedure, a History and Physical                            was performed, and patient medications and                            allergies were reviewed. The patient's tolerance of                            previous anesthesia was also reviewed. The risks                            and benefits of the procedure and the sedation                            options and risks were discussed with the patient.                            All questions were answered, and informed consent                            was obtained. Prior Anticoagulants: The patient has                            taken Eliquis (apixaban), last dose was 3 days  prior to procedure. ASA Grade Assessment: III - A                            patient with severe systemic disease. After                            reviewing the risks and benefits, the patient was                            deemed in satisfactory condition to undergo the                            procedure.                           After obtaining informed  consent, the endoscope was                            passed under direct vision. Throughout the                            procedure, the patient's blood pressure, pulse, and                            oxygen saturations were monitored continuously. The                            [id number]-[id number] ([id number]) Olympus endoscope was introduced                            through the mouth, and advanced to the second part                            of duodenum. The upper GI endoscopy was                            accomplished without difficulty. The patient                            tolerated the procedure well. Scope In: Scope Out: Findings:      The lumen of the upper third of the esophagus and middle third of the       esophagus was mildly dilated.      A widely patent Schatzki ring was found at the gastroesophageal       junction. A TTS dilator was passed through the scope. Dilation with an       18-19-20 mm balloon dilator was performed to 20 mm. The dilation site       was examined following endoscope reinsertion and showed no change.      Area was successfully injected with 100 units botulinum toxin.      Patchy mildly erythematous mucosa without bleeding was found in the       gastric antrum.      The cardia and gastric fundus were normal on retroflexion.      A mild benign deformity was  found in the first portion of the duodenum.       Biopsies were taken with a cold forceps for histology. Significatn       looping was encountered during passage of scope.      The second portion of the duodenum was visualized and appeared normal,       but due to significant looping, stable positioning could not be       obtained. There was no obvious mass or abnormal mucosa noted. Impression:               - Dilation in the upper third of the esophagus and                            in the middle third of the esophagus.                           - Widely patent Schatzki ring. Dilated. Injected                             with botulinum toxin.                           - Erythematous mucosa in the antrum.                           - Duodenal deformity. Biopsied. Moderate Sedation:      Patient did not receive moderate sedation for this procedure, but       instead received monitored anesthesia care. Recommendation:           - Patient has a contact number available for                            emergencies. The signs and symptoms of potential                            delayed complications were discussed with the                            patient. Return to normal activities tomorrow.                            Written discharge instructions were provided to the                            patient.                           - Resume regular diet.                           - Continue present medications.                           - Await pathology results.                           - Resume Eliquis (apixaban) at  prior dose tomorrow. Procedure Code(s):        --- Professional ---                           [phone], Esophagogastroduodenoscopy, flexible,                            transoral; with transendoscopic balloon dilation of                            esophagus (less than 30 mm diameter)                           43239, 59, Esophagogastroduodenoscopy, flexible,                            transoral; with biopsy, single or multiple                           43236, 59, Esophagogastroduodenoscopy, flexible,                            transoral; with directed submucosal injection(s),                            any substance Diagnosis Code(s):        --- Professional ---                           K22.89, Other specified disease of esophagus                           K22.2, Esophageal obstruction                           K31.89, Other diseases of stomach and duodenum                           R13.10, Dysphagia, unspecified                           K22.0, Achalasia of cardia CPT copyright  2022 American Medical Association. All rights reserved. The codes documented in this report are preliminary and upon coder review may  be revised to meet current compliance requirements. Kerin Salen, MD 06/12/2023 12:41:04 PM This report has been signed electronically. Number of Addenda: 0

## 2023-06-12 NOTE — Interval H&P Note (Signed)
History and Physical Interval Note: 88/male with suspected achalasia and dysphagia for EGD with botox injection.  06/12/2023 12:04 PM  Joshua Hudson  has presented today for EGD with botox injection, with the diagnosis of esophageal dysphagia.  The various methods of treatment have been discussed with the patient and family. After consideration of risks, benefits and other options for treatment, the patient has consented to  Procedure(s) with comments: ESOPHAGOGASTRODUODENOSCOPY (EGD) WITH PROPOFOL (N/A) - with dilation and botox injection Balloon dilation wire-guided (N/A) BOTOX INJECTION (N/A) as a surgical intervention.  The patient's history has been reviewed, patient examined, no change in status, stable for surgery.  I have reviewed the patient's chart and labs.  Questions were answered to the patient's satisfaction.     Kerin Salen

## 2023-06-12 NOTE — Progress Notes (Signed)
Eye drops given as ordered with some relief, daughter called eye dr. Larose Kells DR Allena Katz said to bring patient over now.

## 2023-06-12 NOTE — Anesthesia Procedure Notes (Signed)
Procedure Name: MAC Date/Time: 06/12/2023 12:12 PM  Performed by: Vanessa [place], CRNAPre-anesthesia Checklist: Patient identified, Emergency Drugs available, Suction available and Patient being monitored Patient Re-evaluated:Patient Re-evaluated prior to induction Oxygen Delivery Method: Simple face mask

## 2023-06-12 NOTE — Transfer of Care (Signed)
Immediate Anesthesia Transfer of Care Note  Patient: Joshua Hudson  Procedure(s) Performed: ESOPHAGOGASTRODUODENOSCOPY (EGD) WITH PROPOFOL Balloon dilation wire-guided BOTOX INJECTION BIOPSY  Patient Location: Endoscopy Unit  Anesthesia Type:MAC  Level of Consciousness: drowsy  Airway & Oxygen Therapy: Patient Spontanous Breathing and Patient connected to face mask  Post-op Assessment: Report given to RN and Post -op Vital signs reviewed and stable  Post vital signs: Reviewed and stable  Last Vitals:  Vitals Value Taken Time  BP    Temp    Pulse    Resp    SpO2      Last Pain:  Vitals:   06/12/23 1056  TempSrc: Temporal  PainSc: 0-No pain         Complications: No notable events documented.

## 2023-06-12 NOTE — Progress Notes (Signed)
Pt c/o left eye pain. Dr Jessie Foot ordered to flush eye with sterile saline, done as ordered with no relief. Awaiting eye drops from pharmacy

## 2023-06-13 LAB — SURGICAL PATHOLOGY

## 2023-06-14 ENCOUNTER — Ambulatory Visit (INDEPENDENT_AMBULATORY_CARE_PROVIDER_SITE_OTHER): Payer: Medicare PPO

## 2023-06-14 ENCOUNTER — Other Ambulatory Visit: Payer: Self-pay

## 2023-06-14 ENCOUNTER — Inpatient Hospital Stay: Payer: Medicare PPO

## 2023-06-14 VITALS — BP 120/75 | HR 88 | Resp 14 | Ht 63.0 in | Wt 103.0 lb

## 2023-06-14 DIAGNOSIS — I5021 Acute systolic (congestive) heart failure: Secondary | ICD-10-CM | POA: Diagnosis not present

## 2023-06-14 DIAGNOSIS — D598 Other acquired hemolytic anemias: Secondary | ICD-10-CM

## 2023-06-14 DIAGNOSIS — D563 Thalassemia minor: Secondary | ICD-10-CM | POA: Diagnosis not present

## 2023-06-14 DIAGNOSIS — D638 Anemia in other chronic diseases classified elsewhere: Secondary | ICD-10-CM

## 2023-06-14 LAB — CBC WITH DIFFERENTIAL (CANCER CENTER ONLY)
Abs Immature Granulocytes: 0.07 10*3/uL (ref 0.00–0.07)
Basophils Absolute: 0 10*3/uL (ref 0.0–0.1)
Basophils Relative: 0 %
Eosinophils Absolute: 0.2 10*3/uL (ref 0.0–0.5)
Eosinophils Relative: 2 %
HCT: 32.3 % — ABNORMAL LOW (ref 39.0–52.0)
Hemoglobin: 9.9 g/dL — ABNORMAL LOW (ref 13.0–17.0)
Immature Granulocytes: 1 %
Lymphocytes Relative: 7 %
Lymphs Abs: 0.7 10*3/uL (ref 0.7–4.0)
MCH: 21.2 pg — ABNORMAL LOW (ref 26.0–34.0)
MCHC: 30.7 g/dL (ref 30.0–36.0)
MCV: 69.2 fL — ABNORMAL LOW (ref 80.0–100.0)
Monocytes Absolute: 0.7 10*3/uL (ref 0.1–1.0)
Monocytes Relative: 7 %
Neutro Abs: 8.5 10*3/uL — ABNORMAL HIGH (ref 1.7–7.7)
Neutrophils Relative %: 83 %
Platelet Count: 130 10*3/uL — ABNORMAL LOW (ref 150–400)
RBC: 4.67 MIL/uL (ref 4.22–5.81)
RDW: 17.2 % — ABNORMAL HIGH (ref 11.5–15.5)
WBC Count: 10.2 10*3/uL (ref 4.0–10.5)
nRBC: 0 % (ref 0.0–0.2)

## 2023-06-14 LAB — CUP PACEART REMOTE DEVICE CHECK
Battery Remaining Longevity: 141 mo
Battery Voltage: 3.05 V
Brady Statistic RA Percent Paced: 0.01 %
Brady Statistic RV Percent Paced: 99.88 %
Date Time Interrogation Session: 20241010032515
Implantable Lead Connection Status: 753985
Implantable Lead Connection Status: 753985
Implantable Lead Implant Date: 20230908
Implantable Lead Implant Date: 20230908
Implantable Lead Location: 753859
Implantable Lead Location: 753860
Implantable Lead Model: 3830
Implantable Lead Model: 5076
Implantable Pulse Generator Implant Date: 20230908
Lead Channel Impedance Value: 266 Ohm
Lead Channel Impedance Value: 323 Ohm
Lead Channel Impedance Value: 475 Ohm
Lead Channel Impedance Value: 513 Ohm
Lead Channel Pacing Threshold Amplitude: 0.625 V
Lead Channel Pacing Threshold Amplitude: 0.75 V
Lead Channel Pacing Threshold Pulse Width: 0.4 ms
Lead Channel Pacing Threshold Pulse Width: 0.4 ms
Lead Channel Sensing Intrinsic Amplitude: 1.75 mV
Lead Channel Sensing Intrinsic Amplitude: 1.75 mV
Lead Channel Sensing Intrinsic Amplitude: 9.625 mV
Lead Channel Sensing Intrinsic Amplitude: 9.625 mV
Lead Channel Setting Pacing Amplitude: 2 V
Lead Channel Setting Pacing Amplitude: 3 V
Lead Channel Setting Pacing Pulse Width: 0.4 ms
Lead Channel Setting Sensing Sensitivity: 1.2 mV
Zone Setting Status: 755011

## 2023-06-14 LAB — IRON AND IRON BINDING CAPACITY (CC-WL,HP ONLY)
Iron: 91 ug/dL (ref 45–182)
Saturation Ratios: 36 % (ref 17.9–39.5)
TIBC: 251 ug/dL (ref 250–450)
UIBC: 160 ug/dL (ref 117–376)

## 2023-06-14 LAB — FERRITIN: Ferritin: 237 ng/mL (ref 24–336)

## 2023-06-14 LAB — SAMPLE TO BLOOD BANK

## 2023-06-14 LAB — VITAMIN B12: Vitamin B-12: 389 pg/mL (ref 180–914)

## 2023-06-14 MED ORDER — CYANOCOBALAMIN 1000 MCG/ML IJ SOLN
1000.0000 ug | Freq: Once | INTRAMUSCULAR | Status: AC
  Administered 2023-06-14: 1000 ug via INTRAMUSCULAR
  Filled 2023-06-14: qty 1

## 2023-06-14 MED ORDER — EPOETIN ALFA-EPBX 40000 UNIT/ML IJ SOLN
40000.0000 [IU] | Freq: Once | INTRAMUSCULAR | Status: AC
  Administered 2023-06-14: 40000 [IU] via SUBCUTANEOUS
  Filled 2023-06-14: qty 1

## 2023-06-14 NOTE — Progress Notes (Signed)
Per Pamelia Hoit, MD, no blood transfusion today. Proceed with Retacrit and B12 only.

## 2023-06-14 NOTE — Patient Instructions (Signed)
Epoetin Alfa Injection What is this medication? EPOETIN ALFA (e POE e tin AL fa) treats low levels of red blood cells (anemia) caused by kidney disease, chemotherapy, or HIV medications. It can also be used in people who are at risk for blood loss during surgery. It works by Systems analyst make more red blood cells, which reduces the need for blood transfusions. This medicine may be used for other purposes; ask your health care provider or pharmacist if you have questions. COMMON BRAND NAME(S): Epogen, Procrit, Retacrit What should I tell my care team before I take this medication? They need to know if you have any of these conditions: Blood clots Cancer Heart disease High blood pressure On dialysis Seizures Stroke An unusual or allergic reaction to epoetin alfa, albumin, benzyl alcohol, other medications, foods, dyes, or preservatives Pregnant or trying to get pregnant Breast-feeding How should I use this medication? This medication is injected into a vein or under the skin. It is usually given by your care team in a hospital or clinic setting. It may also be given at home. If you get this medication at home, you will be taught how to prepare and give it. Use exactly as directed. Take it as directed on the prescription label at the same time every day. Keep taking it unless your care team tells you to stop. It is important that you put your used needles and syringes in a special sharps container. Do not put them in a trash can. If you do not have a sharps container, call your pharmacist or care team to get one. A special MedGuide will be given to you by the pharmacist with each prescription and refill. Be sure to read this information carefully each time. Talk to your care team about the use of this medication in children. While this medication may be used in children as young as 1 month of age for selected conditions, precautions do apply. Overdosage: If you think you have taken too much  of this medicine contact a poison control center or emergency room at once. NOTE: This medicine is only for you. Do not share this medicine with others. What if I miss a dose? If you miss a dose, take it as soon as you can. If it is almost time for your next dose, take only that dose. Do not take double or extra doses. What may interact with this medication? Darbepoetin alfa Methoxy polyethylene glycol-epoetin beta This list may not describe all possible interactions. Give your health care provider a list of all the medicines, herbs, non-prescription drugs, or dietary supplements you use. Also tell them if you smoke, drink alcohol, or use illegal drugs. Some items may interact with your medicine. What should I watch for while using this medication? Visit your care team for regular checks on your progress. Check your blood pressure as directed. Know what your blood pressure should be and when to contact your care team. Your condition will be monitored carefully while you are receiving this medication. You may need blood work while taking this medication. What side effects may I notice from receiving this medication? Side effects that you should report to your care team as soon as possible: Allergic reactions--skin rash, itching, hives, swelling of the face, lips, tongue, or throat Blood clot--pain, swelling, or warmth in the leg, shortness of breath, chest pain Heart attack--pain or tightness in the chest, shoulders, arms, or jaw, nausea, shortness of breath, cold or clammy skin, feeling faint or lightheaded Increase  in blood pressure Rash, fever, and swollen lymph nodes Redness, blistering, peeling, or loosening of the skin, including inside the mouth Seizures Stroke--sudden numbness or weakness of the face, arm, or leg, trouble speaking, confusion, trouble walking, loss of balance or coordination, dizziness, severe headache, change in vision Side effects that usually do not require medical  attention (report to your care team if they continue or are bothersome): Bone, joint, or muscle pain Cough Headache Nausea Pain, redness, or irritation at injection site This list may not describe all possible side effects. Call your doctor for medical advice about side effects. You may report side effects to FDA at 1-800-FDA-1088. Where should I keep my medication? Keep out of the reach of children and pets. Store in a refrigerator. Do not freeze. Do not shake. Protect from light. Keep this medication in the original container until you are ready to take it. See product for storage information. Get rid of any unused medication after the expiration date. To get rid of medications that are no longer needed or have expired: Take the medication to a medication take-back program. Check with your pharmacy or law enforcement to find a location. If you cannot return the medication, ask your pharmacist or care team how to get rid of the medication safely. NOTE: This sheet is a summary. It may not cover all possible information. If you have questions about this medicine, talk to your doctor, pharmacist, or health care provider.  2024 Elsevier/Gold Standard (2021-12-23 00:00:00)   Vitamin B12 Deficiency Vitamin B12 deficiency occurs when the body does not have enough of this important vitamin. The body needs this vitamin: To make red blood cells. To make DNA. This is the genetic material inside cells. To help the nerves work properly so they can carry messages from the brain to the body. Vitamin B12 deficiency can cause health problems, such as not having enough red blood cells in the blood (anemia). This can lead to nerve damage if untreated. What are the causes? This condition may be caused by: Not eating enough foods that contain vitamin B12. Not having enough stomach acid and digestive fluids to properly absorb vitamin B12 from the food that you eat. Having certain diseases that make it hard to  absorb vitamin B12. These diseases include Crohn's disease, chronic pancreatitis, and cystic fibrosis. An autoimmune disorder in which the body does not make enough of a protein (intrinsic factor) within the stomach, resulting in not enough absorption of vitamin B12. Having a surgery in which part of the stomach or small intestine is removed. Taking certain medicines that make it hard for the body to absorb vitamin B12. These include: Heartburn medicines, such as antacids and proton pump inhibitors. Some medicines that are used to treat diabetes. What increases the risk? The following factors may make you more likely to develop a vitamin B12 deficiency: Being an older adult. Eating a vegetarian or vegan diet that does not include any foods that come from animals. Eating a poor diet while you are pregnant. Taking certain medicines. Having alcoholism. What are the signs or symptoms? In some cases, there are no symptoms of this condition. If the condition leads to anemia or nerve damage, various symptoms may occur, such as: Weakness. Tiredness (fatigue). Loss of appetite. Numbness or tingling in your hands and feet. Redness and burning of the tongue. Depression, confusion, or memory problems. Trouble walking. If anemia is severe, symptoms can include: Shortness of breath. Dizziness. Rapid heart rate. How is this diagnosed? This  condition may be diagnosed with a blood test to measure the level of vitamin B12 in your blood. You may also have other tests, including: A group of tests that measure certain characteristics of blood cells (complete blood count, CBC). A blood test to measure intrinsic factor. A procedure where a thin tube with a camera on the end is used to look into your stomach or intestines (endoscopy). Other tests may be needed to discover the cause of the deficiency. How is this treated? Treatment for this condition depends on the cause. This condition may be treated  by: Changing your eating and drinking habits, such as: Eating more foods that contain vitamin B12. Drinking less alcohol or no alcohol. Getting vitamin B12 injections. Taking vitamin B12 supplements by mouth (orally). Your health care provider will tell you which dose is best for you. Follow these instructions at home: Eating and drinking  Include foods in your diet that come from animals and contain a lot of vitamin B12. These include: Meats and poultry. This includes beef, pork, chicken, [place], and organ meats, such as liver. Seafood. This includes clams, rainbow trout, salmon, tuna, and haddock. Eggs. Dairy foods such as milk, yogurt, and cheese. Eat foods that have vitamin B12 added to them (are fortified), such as ready-to-eat breakfast cereals. Check the label on the package to see if a food is fortified. The items listed above may not be a complete list of foods and beverages you can eat and drink. Contact a dietitian for more information. Alcohol use Do not drink alcohol if: Your health care provider tells you not to drink. You are pregnant, may be pregnant, or are planning to become pregnant. If you drink alcohol: Limit how much you have to: 0-1 drink a day for women. 0-2 drinks a day for men. Know how much alcohol is in your drink. In the [place] one drink equals one 12 oz bottle of beer (355 mL), one 5 oz glass of wine (148 mL), or one 1 oz glass of hard liquor (44 mL). General instructions Get vitamin B12 injections if told to by your health care provider. Take supplements only as told by your health care provider. Follow the directions carefully. Keep all follow-up visits. This is important. Contact a health care provider if: Your symptoms come back. Your symptoms get worse or do not improve with treatment. Get help right away: You develop shortness of breath. You have a rapid heart rate. You have chest pain. You become dizzy or you faint. These symptoms may be an  emergency. Get help right away. Call 911. Do not wait to see if the symptoms will go away. Do not drive yourself to the hospital. Summary Vitamin B12 deficiency occurs when the body does not have enough of this important vitamin. Common causes include not eating enough foods that contain vitamin B12, not being able to absorb vitamin B12 from the food that you eat, having a surgery in which part of the stomach or small intestine is removed, or taking certain medicines. Eat foods that have vitamin B12 in them. Treatment may include making a change in the way you eat and drink, getting vitamin B12 injections, or taking vitamin B12 supplements. This information is not intended to replace advice given to you by your health care provider. Make sure you discuss any questions you have with your health care provider. Document Revised: 04/15/2021 Document Reviewed: 04/15/2021 Elsevier Patient Education  2024 [employer].

## 2023-06-16 ENCOUNTER — Encounter (HOSPITAL_COMMUNITY): Payer: Self-pay | Admitting: Gastroenterology

## 2023-06-21 ENCOUNTER — Other Ambulatory Visit: Payer: Self-pay | Admitting: Gastroenterology

## 2023-06-21 DIAGNOSIS — R1319 Other dysphagia: Secondary | ICD-10-CM

## 2023-06-26 NOTE — Progress Notes (Signed)
Remote pacemaker transmission.   

## 2023-06-29 ENCOUNTER — Other Ambulatory Visit: Payer: Medicare PPO

## 2023-06-29 ENCOUNTER — Ambulatory Visit: Payer: Medicare PPO | Admitting: Adult Health

## 2023-07-05 ENCOUNTER — Inpatient Hospital Stay: Payer: Medicare PPO

## 2023-07-05 ENCOUNTER — Inpatient Hospital Stay: Payer: Medicare PPO | Admitting: Hematology and Oncology

## 2023-07-05 VITALS — BP 123/73 | HR 88 | Temp 97.2°F | Resp 18 | Ht 63.0 in | Wt 112.3 lb

## 2023-07-05 DIAGNOSIS — D638 Anemia in other chronic diseases classified elsewhere: Secondary | ICD-10-CM | POA: Diagnosis not present

## 2023-07-05 DIAGNOSIS — D598 Other acquired hemolytic anemias: Secondary | ICD-10-CM

## 2023-07-05 DIAGNOSIS — D563 Thalassemia minor: Secondary | ICD-10-CM | POA: Diagnosis not present

## 2023-07-05 LAB — CBC WITH DIFFERENTIAL (CANCER CENTER ONLY)
Abs Immature Granulocytes: 0.07 10*3/uL (ref 0.00–0.07)
Basophils Absolute: 0 10*3/uL (ref 0.0–0.1)
Basophils Relative: 0 %
Eosinophils Absolute: 0.2 10*3/uL (ref 0.0–0.5)
Eosinophils Relative: 2 %
HCT: 35 % — ABNORMAL LOW (ref 39.0–52.0)
Hemoglobin: 10.8 g/dL — ABNORMAL LOW (ref 13.0–17.0)
Immature Granulocytes: 1 %
Lymphocytes Relative: 7 %
Lymphs Abs: 0.8 10*3/uL (ref 0.7–4.0)
MCH: 21.2 pg — ABNORMAL LOW (ref 26.0–34.0)
MCHC: 30.9 g/dL (ref 30.0–36.0)
MCV: 68.8 fL — ABNORMAL LOW (ref 80.0–100.0)
Monocytes Absolute: 0.9 10*3/uL (ref 0.1–1.0)
Monocytes Relative: 8 %
Neutro Abs: 8.7 10*3/uL — ABNORMAL HIGH (ref 1.7–7.7)
Neutrophils Relative %: 82 %
Platelet Count: 145 10*3/uL — ABNORMAL LOW (ref 150–400)
RBC: 5.09 MIL/uL (ref 4.22–5.81)
RDW: 17.2 % — ABNORMAL HIGH (ref 11.5–15.5)
WBC Count: 10.7 10*3/uL — ABNORMAL HIGH (ref 4.0–10.5)
nRBC: 0.2 % (ref 0.0–0.2)

## 2023-07-05 LAB — SAMPLE TO BLOOD BANK

## 2023-07-05 LAB — IRON AND IRON BINDING CAPACITY (CC-WL,HP ONLY)
Iron: 111 ug/dL (ref 45–182)
Saturation Ratios: 41 % — ABNORMAL HIGH (ref 17.9–39.5)
TIBC: 269 ug/dL (ref 250–450)
UIBC: 158 ug/dL (ref 117–376)

## 2023-07-05 LAB — FERRITIN: Ferritin: 113 ng/mL (ref 24–336)

## 2023-07-05 LAB — VITAMIN B12: Vitamin B-12: 296 pg/mL (ref 180–914)

## 2023-07-05 MED ORDER — CYANOCOBALAMIN 1000 MCG/ML IJ SOLN
1000.0000 ug | Freq: Once | INTRAMUSCULAR | Status: AC
  Administered 2023-07-05: 1000 ug via INTRAMUSCULAR
  Filled 2023-07-05: qty 1

## 2023-07-05 NOTE — Assessment & Plan Note (Signed)
Hemoglobin electrophoresis: Beta thalassemia minor.    Hospitalization 09/22/2019-09/25/2019: A. fib with RVR status post cardioversion, currently on Eliquis Hospitalization 02/28/2022-03/11/2022: Atrial fibrillation: Cardioversion planned for 03/22/2022. 05/12/22: Ablation and Pacemaker Implantation  06/12/2023: EGD with Botox   Lab review: 09/25/2019: WBC 7.2, hemoglobin 9.6, platelets 145 04/14/22: Hb 7.8, Platelets 91 (PRBC) 06/14/22: Hb 10.4, Pl 151, Cr 1.66, Iron sat: 16%, Ferritin 287 07/05/22: Hemoglobin 9.7 08/14/22: Hemoglobin 7.4 09/26/2022: Hemoglobin 9.1 (Retacrit injection today) 04/12/2023: Hemoglobin 10.4 (Retacrit and starting B12 injections) 05/24/2023: Hemoglobin 9.9 (Retacrit, B12 and IV iron) 07/05/2023:   IV iron: March 2024, September 2024   Current treatment:  Retacrit to be given today Every 3 week B12 injections    He will come back again in 3 weeks for lab check and transfusions or Retacrit as needed Every 6 weeks for MD visits

## 2023-07-05 NOTE — Progress Notes (Signed)
Patient Care Team: Joya Martyr, MD as PCP - General (Internal Medicine) Quintella Reichert, MD as PCP - Cardiology (Cardiology) Marinus Maw, MD as PCP - Electrophysiology (Cardiology) Quintella Reichert, MD as Consulting Physician (Cardiology) Serena Croissant, MD as Consulting Physician (Hematology and Oncology)  DIAGNOSIS:  Encounter Diagnosis  Name Primary?   Anemia of chronic disease Yes    CHIEF COMPLIANT: Follow-up of anemia   History of Present Illness   The patient, with a history of anemia, reports feeling 'not worth a flip' and 'no energy at all.' He notes that despite improvements in his hemoglobin, which is currently 10.8, he does not feel an increase in energy. He has been receiving B12 and Retacrit injections for his anemia. The patient's hemoglobin has improved from 9.9 at the last visit to 10.8 today, which is the highest it has been in years.         ALLERGIES:  has No Known Allergies.  MEDICATIONS:  Current Outpatient Medications  Medication Sig Dispense Refill   cholestyramine (QUESTRAN) 4 g packet Take 4 g by mouth daily.     CREON 24000-76000 units CPEP as directed Orally three times a day     ELIQUIS 2.5 MG TABS tablet TAKE 1 TABLET(2.5 MG) BY MOUTH TWICE DAILY 180 tablet 1   epoetin alfa-epbx (RETACRIT) 16109 UNIT/ML injection Inject 40,000 Units into the skin every 6 (six) weeks. As needed     finasteride (PROSCAR) 5 MG tablet Take 5 mg by mouth daily.     fluticasone (FLONASE) 50 MCG/ACT nasal spray Place 1 spray into both nostrils daily as needed for allergies or rhinitis.     JARDIANCE 10 MG TABS tablet TAKE 1 TABLET(10 MG) BY MOUTH DAILY 90 tablet 2   ketotifen (ZADITOR) 0.025 % ophthalmic solution Place 1 drop into both eyes 2 (two) times daily as needed (itchy eyes).     levothyroxine (SYNTHROID) 88 MCG tablet Take 88 mcg by mouth every morning.     oxyCODONE (ROXICODONE) 5 MG immediate release tablet Take 1 tablet (5 mg total) by mouth every 4  (four) hours as needed for severe pain. 10 tablet 0   pantoprazole (PROTONIX) 40 MG tablet Take 1 tablet (40 mg total) by mouth 2 (two) times daily before a meal for 30 days, THEN 1 tablet (40 mg total) daily. 120 tablet 0   potassium chloride SA (KLOR-CON M) 20 MEQ tablet TAKE 1 TABLET(20 MEQ) BY MOUTH EVERY OTHER DAY 45 tablet 1   predniSONE (DELTASONE) 5 MG tablet Take 5 mg by mouth daily with breakfast. TAKING 15mg  QD FOR 3Wks     torsemide (DEMADEX) 20 MG tablet Take 40 mg (2  tablets) by mouth in the AM and 20 mg (1 tablet) by mouth in the PM 270 tablet 1   No current facility-administered medications for this visit.    PHYSICAL EXAMINATION: ECOG PERFORMANCE STATUS: 1 - Symptomatic but completely ambulatory  Vitals:   07/05/23 0746  BP: 123/73  Pulse: 88  Resp: 18  Temp: (!) 97.2 F (36.2 C)  SpO2: 100%   Filed Weights   07/05/23 0746  Weight: 112 lb 4.8 oz (50.9 kg)      LABORATORY DATA:  I have reviewed the data as listed    Latest Ref Rng & Units 12/27/2022    7:37 AM 12/06/2022    7:23 AM 11/07/2022    7:21 AM  CMP  Glucose 70 - 99 mg/dL 97  604  89  BUN 8 - 23 mg/dL 37  33  25   Creatinine 0.61 - 1.24 mg/dL 7.82  9.56  2.13   Sodium 135 - 145 mmol/L 142  140  142   Potassium 3.5 - 5.1 mmol/L 3.5  3.9  3.4   Chloride 98 - 111 mmol/L 103  104  102   CO2 22 - 32 mmol/L 30  30  32   Calcium 8.9 - 10.3 mg/dL 9.5  9.5  9.6   Total Protein 6.5 - 8.1 g/dL 6.4  6.1  6.6   Total Bilirubin 0.3 - 1.2 mg/dL 0.4  0.5  0.4   Alkaline Phos 38 - 126 U/L 59  56  54   AST 15 - 41 U/L 16  16  15    ALT 0 - 44 U/L 8  9  8      Lab Results  Component Value Date   WBC 10.7 (H) 07/05/2023   HGB 10.8 (L) 07/05/2023   HCT 35.0 (L) 07/05/2023   MCV 68.8 (L) 07/05/2023   PLT 145 (L) 07/05/2023   NEUTROABS 8.7 (H) 07/05/2023    ASSESSMENT & PLAN:  Anemia of chronic disease Hemoglobin electrophoresis: Beta thalassemia minor.    Hospitalization 09/22/2019-09/25/2019: A. fib with  RVR status post cardioversion, currently on Eliquis Hospitalization 02/28/2022-03/11/2022: Atrial fibrillation: Cardioversion planned for 03/22/2022. 05/12/22: Ablation and Pacemaker Implantation  06/12/2023: EGD with Botox   Lab review: 09/25/2019: WBC 7.2, hemoglobin 9.6, platelets 145 04/14/22: Hb 7.8, Platelets 91 (PRBC) 06/14/22: Hb 10.4, Pl 151, Cr 1.66, Iron sat: 16%, Ferritin 287 07/05/22: Hemoglobin 9.7 08/14/22: Hemoglobin 7.4 09/26/2022: Hemoglobin 9.1 (Retacrit injection today) 04/12/2023: Hemoglobin 10.4 (Retacrit and starting B12 injections) 05/24/2023: Hemoglobin 9.9 (Retacrit, B12 and IV iron) 07/05/2023: Hemoglobin 10.8   IV iron: March 2024, September 2024   Current treatment:  Retacrit will be held today Every 3 week B12 injections    He will come back again in 3 weeks for lab check and transfusions or Retacrit as needed Every 6 weeks for MD visits   No orders of the defined types were placed in this encounter.  The patient has a good understanding of the overall plan. he agrees with it. he will call with any problems that may develop before the next visit here. Total time spent: 30 mins including face to face time and time spent for planning, charting and co-ordination of care   Tamsen Meek, MD 07/05/23

## 2023-07-05 NOTE — Patient Instructions (Signed)
Vitamin B12 Deficiency Vitamin B12 deficiency occurs when the body does not have enough of this important vitamin. The body needs this vitamin: To make red blood cells. To make DNA. This is the genetic material inside cells. To help the nerves work properly so they can carry messages from the brain to the body. Vitamin B12 deficiency can cause health problems, such as not having enough red blood cells in the blood (anemia). This can lead to nerve damage if untreated. What are the causes? This condition may be caused by: Not eating enough foods that contain vitamin B12. Not having enough stomach acid and digestive fluids to properly absorb vitamin B12 from the food that you eat. Having certain diseases that make it hard to absorb vitamin B12. These diseases include Crohn's disease, chronic pancreatitis, and cystic fibrosis. An autoimmune disorder in which the body does not make enough of a protein (intrinsic factor) within the stomach, resulting in not enough absorption of vitamin B12. Having a surgery in which part of the stomach or small intestine is removed. Taking certain medicines that make it hard for the body to absorb vitamin B12. These include: Heartburn medicines, such as antacids and proton pump inhibitors. Some medicines that are used to treat diabetes. What increases the risk? The following factors may make you more likely to develop a vitamin B12 deficiency: Being an older adult. Eating a vegetarian or vegan diet that does not include any foods that come from animals. Eating a poor diet while you are pregnant. Taking certain medicines. Having alcoholism. What are the signs or symptoms? In some cases, there are no symptoms of this condition. If the condition leads to anemia or nerve damage, various symptoms may occur, such as: Weakness. Tiredness (fatigue). Loss of appetite. Numbness or tingling in your hands and feet. Redness and burning of the tongue. Depression,  confusion, or memory problems. Trouble walking. If anemia is severe, symptoms can include: Shortness of breath. Dizziness. Rapid heart rate. How is this diagnosed? This condition may be diagnosed with a blood test to measure the level of vitamin B12 in your blood. You may also have other tests, including: A group of tests that measure certain characteristics of blood cells (complete blood count, CBC). A blood test to measure intrinsic factor. A procedure where a thin tube with a camera on the end is used to look into your stomach or intestines (endoscopy). Other tests may be needed to discover the cause of the deficiency. How is this treated? Treatment for this condition depends on the cause. This condition may be treated by: Changing your eating and drinking habits, such as: Eating more foods that contain vitamin B12. Drinking less alcohol or no alcohol. Getting vitamin B12 injections. Taking vitamin B12 supplements by mouth (orally). Your health care provider will tell you which dose is best for you. Follow these instructions at home: Eating and drinking  Include foods in your diet that come from animals and contain a lot of vitamin B12. These include: Meats and poultry. This includes beef, pork, chicken, turkey, and organ meats, such as liver. Seafood. This includes clams, rainbow trout, salmon, tuna, and haddock. Eggs. Dairy foods such as milk, yogurt, and cheese. Eat foods that have vitamin B12 added to them (are fortified), such as ready-to-eat breakfast cereals. Check the label on the package to see if a food is fortified. The items listed above may not be a complete list of foods and beverages you can eat and drink. Contact a dietitian for   more information. Alcohol use Do not drink alcohol if: Your health care provider tells you not to drink. You are pregnant, may be pregnant, or are planning to become pregnant. If you drink alcohol: Limit how much you have to: 0-1 drink a  day for women. 0-2 drinks a day for men. Know how much alcohol is in your drink. In the [place] one drink equals one 12 oz bottle of beer (355 mL), one 5 oz glass of wine (148 mL), or one 1 oz glass of hard liquor (44 mL). General instructions Get vitamin B12 injections if told to by your health care provider. Take supplements only as told by your health care provider. Follow the directions carefully. Keep all follow-up visits. This is important. Contact a health care provider if: Your symptoms come back. Your symptoms get worse or do not improve with treatment. Get help right away: You develop shortness of breath. You have a rapid heart rate. You have chest pain. You become dizzy or you faint. These symptoms may be an emergency. Get help right away. Call 911. Do not wait to see if the symptoms will go away. Do not drive yourself to the hospital. Summary Vitamin B12 deficiency occurs when the body does not have enough of this important vitamin. Common causes include not eating enough foods that contain vitamin B12, not being able to absorb vitamin B12 from the food that you eat, having a surgery in which part of the stomach or small intestine is removed, or taking certain medicines. Eat foods that have vitamin B12 in them. Treatment may include making a change in the way you eat and drink, getting vitamin B12 injections, or taking vitamin B12 supplements. This information is not intended to replace advice given to you by your health care provider. Make sure you discuss any questions you have with your health care provider. Document Revised: 04/15/2021 Document Reviewed: 04/15/2021 [employer] Patient Education  2024 [employer]  

## 2023-07-06 ENCOUNTER — Other Ambulatory Visit: Payer: Self-pay | Admitting: Physician Assistant

## 2023-07-17 ENCOUNTER — Ambulatory Visit
Admission: RE | Admit: 2023-07-17 | Discharge: 2023-07-17 | Disposition: A | Discharge status: Home / Self Care | Payer: Medicare PPO | Source: Ambulatory Visit | Attending: Gastroenterology

## 2023-07-17 DIAGNOSIS — R1310 Dysphagia, unspecified: Secondary | ICD-10-CM | POA: Diagnosis not present

## 2023-07-17 DIAGNOSIS — K2289 Other specified disease of esophagus: Secondary | ICD-10-CM | POA: Diagnosis not present

## 2023-07-17 DIAGNOSIS — R1319 Other dysphagia: Secondary | ICD-10-CM

## 2023-07-19 ENCOUNTER — Other Ambulatory Visit (HOSPITAL_COMMUNITY): Payer: Self-pay | Admitting: *Deleted

## 2023-07-19 ENCOUNTER — Telehealth (HOSPITAL_COMMUNITY): Payer: Self-pay | Admitting: *Deleted

## 2023-07-19 DIAGNOSIS — R1310 Dysphagia, unspecified: Secondary | ICD-10-CM

## 2023-07-19 DIAGNOSIS — R059 Cough, unspecified: Secondary | ICD-10-CM

## 2023-07-19 NOTE — Telephone Encounter (Signed)
Attempted to contact patient to schedule OP MBS. Left VM. RKEEL

## 2023-07-20 ENCOUNTER — Encounter: Payer: Self-pay | Admitting: Hematology and Oncology

## 2023-07-20 ENCOUNTER — Inpatient Hospital Stay: Payer: Medicare PPO

## 2023-07-20 ENCOUNTER — Inpatient Hospital Stay: Payer: Medicare PPO | Attending: Hematology and Oncology

## 2023-07-20 VITALS — BP 121/83 | HR 82 | Temp 98.0°F | Resp 17

## 2023-07-20 DIAGNOSIS — D638 Anemia in other chronic diseases classified elsewhere: Secondary | ICD-10-CM | POA: Diagnosis not present

## 2023-07-20 DIAGNOSIS — D563 Thalassemia minor: Secondary | ICD-10-CM | POA: Insufficient documentation

## 2023-07-20 DIAGNOSIS — D598 Other acquired hemolytic anemias: Secondary | ICD-10-CM

## 2023-07-20 LAB — IRON AND IRON BINDING CAPACITY (CC-WL,HP ONLY)
Iron: 84 ug/dL (ref 45–182)
Saturation Ratios: 32 % (ref 17.9–39.5)
TIBC: 260 ug/dL (ref 250–450)
UIBC: 176 ug/dL (ref 117–376)

## 2023-07-20 LAB — CBC WITH DIFFERENTIAL (CANCER CENTER ONLY)
Abs Immature Granulocytes: 0.07 10*3/uL (ref 0.00–0.07)
Basophils Absolute: 0 10*3/uL (ref 0.0–0.1)
Basophils Relative: 0 %
Eosinophils Absolute: 0.2 10*3/uL (ref 0.0–0.5)
Eosinophils Relative: 2 %
HCT: 33.4 % — ABNORMAL LOW (ref 39.0–52.0)
Hemoglobin: 10.3 g/dL — ABNORMAL LOW (ref 13.0–17.0)
Immature Granulocytes: 1 %
Lymphocytes Relative: 6 %
Lymphs Abs: 0.7 10*3/uL (ref 0.7–4.0)
MCH: 21.2 pg — ABNORMAL LOW (ref 26.0–34.0)
MCHC: 30.8 g/dL (ref 30.0–36.0)
MCV: 68.7 fL — ABNORMAL LOW (ref 80.0–100.0)
Monocytes Absolute: 0.5 10*3/uL (ref 0.1–1.0)
Monocytes Relative: 5 %
Neutro Abs: 8.7 10*3/uL — ABNORMAL HIGH (ref 1.7–7.7)
Neutrophils Relative %: 86 %
Platelet Count: 146 10*3/uL — ABNORMAL LOW (ref 150–400)
RBC: 4.86 MIL/uL (ref 4.22–5.81)
RDW: 17.2 % — ABNORMAL HIGH (ref 11.5–15.5)
WBC Count: 10.1 10*3/uL (ref 4.0–10.5)
nRBC: 0 % (ref 0.0–0.2)

## 2023-07-20 LAB — VITAMIN B12: Vitamin B-12: 487 pg/mL (ref 180–914)

## 2023-07-20 LAB — SAMPLE TO BLOOD BANK

## 2023-07-20 LAB — FERRITIN: Ferritin: 76 ng/mL (ref 24–336)

## 2023-07-20 MED ORDER — EPOETIN ALFA-EPBX 40000 UNIT/ML IJ SOLN
40000.0000 [IU] | Freq: Once | INTRAMUSCULAR | Status: AC
  Administered 2023-07-20: 40000 [IU] via SUBCUTANEOUS
  Filled 2023-07-20: qty 1

## 2023-07-23 ENCOUNTER — Encounter: Payer: Self-pay | Admitting: Cardiology

## 2023-07-23 ENCOUNTER — Ambulatory Visit (HOSPITAL_COMMUNITY): Payer: Medicare PPO | Attending: Cardiovascular Disease

## 2023-07-23 ENCOUNTER — Telehealth: Payer: Self-pay

## 2023-07-23 DIAGNOSIS — I340 Nonrheumatic mitral (valve) insufficiency: Secondary | ICD-10-CM | POA: Diagnosis not present

## 2023-07-23 LAB — ECHOCARDIOGRAM COMPLETE
AR max vel: 1.15 cm2
AV Area VTI: 1.24 cm2
AV Area mean vel: 1.26 cm2
AV Mean grad: 10 mm[Hg]
AV Peak grad: 23 mm[Hg]
Ao pk vel: 2.4 m/s
Area-P 1/2: 5.93 cm2
MV M vel: 5.09 m/s
MV Peak grad: 103.4 mm[Hg]
Radius: 0.4 cm
S' Lateral: 2.6 cm

## 2023-07-23 NOTE — Telephone Encounter (Signed)
Call to patient to discuss Echo results. Spoke with dtr Patty ([facility]) who verbalizes understanding of echo results, ie 2D echo showed normal heart muscle function with enlargement of both upper chambers of the heart, mild to moderate leakiness of the mitral valve and tricuspid valve and stable TAVR.

## 2023-07-23 NOTE — Telephone Encounter (Signed)
-----   Message from Armanda Magic sent at 07/23/2023 12:41 PM EST ----- 2D echo showed normal heart muscle function with enlargement of both upper chambers of the heart, mild to moderate leakiness of the mitral valve and tricuspid valve and stable TAVR

## 2023-07-24 ENCOUNTER — Ambulatory Visit: Payer: Medicare PPO | Attending: Cardiology | Admitting: Cardiology

## 2023-07-24 ENCOUNTER — Encounter: Payer: Self-pay | Admitting: Cardiology

## 2023-07-24 VITALS — BP 110/68 | HR 82 | Ht 63.0 in | Wt 111.4 lb

## 2023-07-24 DIAGNOSIS — I079 Rheumatic tricuspid valve disease, unspecified: Secondary | ICD-10-CM | POA: Diagnosis not present

## 2023-07-24 DIAGNOSIS — R0602 Shortness of breath: Secondary | ICD-10-CM | POA: Diagnosis not present

## 2023-07-24 DIAGNOSIS — I059 Rheumatic mitral valve disease, unspecified: Secondary | ICD-10-CM | POA: Diagnosis not present

## 2023-07-24 DIAGNOSIS — Z950 Presence of cardiac pacemaker: Secondary | ICD-10-CM | POA: Diagnosis not present

## 2023-07-24 DIAGNOSIS — I4892 Unspecified atrial flutter: Secondary | ICD-10-CM

## 2023-07-24 DIAGNOSIS — I359 Nonrheumatic aortic valve disorder, unspecified: Secondary | ICD-10-CM

## 2023-07-24 DIAGNOSIS — R609 Edema, unspecified: Secondary | ICD-10-CM

## 2023-07-24 DIAGNOSIS — I482 Chronic atrial fibrillation, unspecified: Secondary | ICD-10-CM | POA: Diagnosis not present

## 2023-07-24 NOTE — Progress Notes (Signed)
Date:  07/24/2023   ID:  Joshua Hudson, DOB 03-Jun-1935, MRN [id number]  Patient Location:  Home  Provider location:   [place]  PCP:  Henrine Screws, MD  Cardiologist:  Armanda Magic, MD  Electrophysiologist:  Lewayne Bunting, MD   Chief Complaint:  AS, afib, CAD  History of Present Illness:    Joshua Hudson is a 88 y.o. male  with a history of severe AS s/p AVR with a 26 mm Edwards bioprosthetic valve in 2009 with post op afib (with no recurrence), HTN, rheumatoid arthritis on MTX, leflunomide and prednisone, achalasia, mod MR, and chronic diastolic CHF, hemolytic anemia, bioprosthetic valve failure with severe AI s/p valve-in-valve TAVR (04/15/19).    Patient underwent aortic valve replacement using a 25 mm Edwards magna stented bovine pericardial tissue valve by Dr. Tyrone Sage in 2009 for severe symptomatic aortic stenosis.  His early postoperative recovery was notable for postoperative atrial fibrillation.  He was seen by Herma Carson- PA in the office on 03/18/2019 for preoperative clearance for redo shoulder arthroplasty. He was noted to have significant lower extremity edema as well as other signs and symptoms of acute heart failure. Follow-up TTE on 03/18/2019 revealed normal left ventricular systolic function with at least moderate AI.  Subsequent TEE on 04/02/2019 confirmed the presence of severe prosthetic valve dysfunction with severe AI.  There was no vegetation on the aortic valve she suggest endocarditis. Cardiac catheterization on 04/02/2019 showed normal coronaries. He was also noted to have worsening anemia. Haptoglobin was low c/w hemolytic anemia.    He underwent successful valve in valve TAVR with a 26 mm Edwards Sapien Ultra THV via the TF approach on 04/15/19. Post operative echo showed EF 60-65%, normally functioning TAVR with mean gradient of 14 mmHg and no AI.   He was found to be in aflutter with RVR on 09/22/2019 when he presented for esophageal dilatation.  He  had been having fatigue, DOE and LE edema.  2D ecoh showed moderately reduced LVF.  He was started on Eliquis and underwent successful TEE/DCCV to NSR.  2D echo showed EF 45-50% felt to be tachy mediated.  He was seen by EP and underwent aflutter ablation.  He was back in aflutter at OV on 11/11/19 and started on Amio.  He underwent DCCV to NSR on 12/25/2019.  He had a GIB in June 2023 and started on PPI and Eliquis was held for 1 week.  He went back into atrial fibrillation with RVR in March 10 2022 and underwent TEE/DDCV to NSR.  His EF was 50-55% at that time.  He then went back into afigb and underwent DCCV on 03/28/22.  He has felt poorly since then.  On 05/12/22 he underwent AVN ablation with PPM and is followed by Dr. Ladona Ridgel.   He is here today for followup and is doing well.  He has chronic SOB and fatigue that is stable.  He also has chronic LE edema by the end of the day that has resolved by morning. He denies any chest pain or pressure, PND, orthopnea, palpitations or syncope. He is compliant with his meds and is tolerating meds with no SE.    Prior CV studies:   The following studies were reviewed today:  Hospital notes, TEE, DCCV notes, EP consult note  Past Medical History:  Diagnosis Date   Achalasia 06/12/2019   Noted on EGD   Aortic insufficiency 03/24/2019   AI of bioprosthetic AVR severe 4 plus   Atrial flutter (HCC)  Chronic diastolic CHF (congestive heart failure) (HCC) 03/24/2019   Chronic kidney disease, stage 3b (HCC)    Colon polyps    s/p diverticular perforation requiring 2-stage repair   Derangement of right shoulder joint    need replacing has no use of   Diverticulosis    DJD (degenerative joint disease), lumbosacral    Dysphagia    eats soft food   Esophageal stricture    GERD (gastroesophageal reflux disease)    GIB (gastrointestinal bleeding)    Hemolytic anemia (HCC)    History of blood transfusion given with 04-15-19 surgery   History of blood transfusion  07/18/2019   History of kidney stones    passed stones   Hypertension    Mitral regurgitation    Myxomatous mitral valve with mild to moderate MR on echo 07/2023   Occipital neuralgia    Pacemaker    AVN ablation + MDT PPM 2023   Pancytopenia (HCC)    Postoperative atrial fibrillation (HCC) 05/10/2015   Prosthetic valve dysfunction    Rheumatoid arthritis (HCC)    s/o long term steroids  shoulders and hands   Rotator cuff arthropathy    right   S/P aortic valve replacement with bioprosthetic valve 01/16/2008   25mm Edwards Magna perimount bovine pericardial tissue valve, model 3000   S/P valve-in-valve TAVR 04/15/2019   26 mm Edwards Sapien 3 Ultra transcatheter heart valve placed via percutaneous right transfemoral approach    SBE (subacute bacterial endocarditis) prophylaxis candidate    for dental procedures   Schatzki's ring 06/12/2019   Narrowing, Noted on EGD   Severe aortic stenosis    S/P prosthetic valve replacement w 25 mm Edwards like science percardial tissue valve,[name] - 01/2008   Thrombocytopenia (HCC)    Past Surgical History:  Procedure Laterality Date   A-FLUTTER ABLATION N/A 10/10/2019   Procedure: A-FLUTTER ABLATION;  Surgeon: Marinus Maw, MD;  [facility]: [facility];  Service: Cardiovascular;  Laterality: N/A;   APPENDECTOMY     AV NODE ABLATION N/A 05/12/2022   Procedure: AV NODE ABLATION;  Surgeon: Marinus Maw, MD;  Location: [facility];  Service: Cardiovascular;  Laterality: N/A;   BALLOON DILATION N/A 06/12/2019   Procedure: BALLOON DILATION;  Surgeon: Kerin Salen, MD;  Location: [facility];  Service: Gastroenterology;  Laterality: N/A;   BIOPSY  06/12/2023   Procedure: BIOPSY;  Surgeon: Kerin Salen, MD;  Location: [facility];  Service: Gastroenterology;;   BOTOX INJECTION N/A 01/22/2018   Procedure: BOTOX INJECTION;  Surgeon: Kerin Salen, MD;  Location: [facility];  Service: Gastroenterology;  Laterality: N/A;   BOTOX INJECTION  N/A 06/12/2023   Procedure: BOTOX INJECTION;  Surgeon: Kerin Salen, MD;  Location: [facility];  Service: Gastroenterology;  Laterality: N/A;   CARDIAC CATHETERIZATION     09   CARDIAC VALVE REPLACEMENT  01/2008   aortic valve replacement   CARDIOVERSION N/A 09/25/2019   Procedure: CARDIOVERSION;  Surgeon: Jake Bathe, MD;  Location: [facility] ENDOSCOPY;  Service: Cardiovascular;  Laterality: N/A;   CARDIOVERSION N/A 12/25/2019   Procedure: CARDIOVERSION;  Surgeon: Lewayne Bunting, MD;  Location: [facility] ENDOSCOPY;  Service: Cardiovascular;  Laterality: N/A;   CARDIOVERSION N/A 03/08/2022   Procedure: CARDIOVERSION;  Surgeon: Meriam Sprague, MD;  Location: [facility] ENDOSCOPY;  Service: Cardiovascular;  Laterality: N/A;   CARDIOVERSION N/A 03/28/2022   Procedure: CARDIOVERSION;  Surgeon: Thomasene Ripple, DO;  Location: [facility];  Service: Cardiovascular;  Laterality: N/A;   CATARACT EXTRACTION W/  INTRAOCULAR LENS  IMPLANT, BILATERAL     COLON RESECTION     diverticulitis    COLONOSCOPY N/A 03/03/2022   Procedure: COLONOSCOPY;  Surgeon: Kathi Der, MD;  Location: [facility];  Service: Gastroenterology;  Laterality: N/A;   CORONARY ANGIOPLASTY     dental implants     permanent   ENTEROSCOPY N/A 03/03/2022   Procedure: ENTEROSCOPY;  Surgeon: Kathi Der, MD;  Location: [facility];  Service: Gastroenterology;  Laterality: N/A;   ESOPHAGEAL MANOMETRY N/A 11/07/2017   Procedure: ESOPHAGEAL MANOMETRY (EM);  Surgeon: Kerin Salen, MD;  Location: [facility];  Service: Gastroenterology;  Laterality: N/A;   ESOPHAGOGASTRODUODENOSCOPY N/A 02/24/2022   Procedure: ESOPHAGOGASTRODUODENOSCOPY (EGD);  Surgeon: Willis Modena, MD;  Location: [facility];  Service: Gastroenterology;  Laterality: N/A;   ESOPHAGOGASTRODUODENOSCOPY (EGD) WITH PROPOFOL N/A 01/22/2018   Procedure: ESOPHAGOGASTRODUODENOSCOPY (EGD) WITH PROPOFOL;  Surgeon: Kerin Salen, MD;  [facility];  Service: Gastroenterology;   Laterality: N/A;   ESOPHAGOGASTRODUODENOSCOPY (EGD) WITH PROPOFOL N/A 04/30/2019   Procedure: ESOPHAGOGASTRODUODENOSCOPY (EGD) WITH PROPOFOL;  Surgeon: Carman Ching, MD;  Location: [facility] ENDOSCOPY;  Service: Endoscopy;  Laterality: N/A;   ESOPHAGOGASTRODUODENOSCOPY (EGD) WITH PROPOFOL N/A 06/12/2019   Procedure: ESOPHAGOGASTRODUODENOSCOPY (EGD) WITH PROPOFOL;  Surgeon: Kerin Salen, MD;  Location: [facility];  Service: Gastroenterology;  Laterality: N/A;  with botox injection   ESOPHAGOGASTRODUODENOSCOPY (EGD) WITH PROPOFOL N/A 06/12/2023   Procedure: ESOPHAGOGASTRODUODENOSCOPY (EGD) WITH PROPOFOL;  Surgeon: Kerin Salen, MD;  Location: [facility];  Service: Gastroenterology;  Laterality: N/A;  with dilation and botox injection   EXCISIONAL TOTAL SHOULDER ARTHROPLASTY WITH ANTIBIOTIC SPACER Right 12/31/2018   Procedure: EXCISIONAL TOTAL SHOULDER ARTHROPLASTY WITH ANTIBIOTIC SPACER;  Surgeon: Beverely Low, MD;  Location: [facility] OR;  Service: Orthopedics;  Laterality: Right;   GIVENS CAPSULE STUDY N/A 03/01/2022   Procedure: GIVENS CAPSULE STUDY;  Surgeon: Kathi Der, MD;  Location: [facility];  Service: Gastroenterology;  Laterality: N/A;   HEMOSTASIS CLIP PLACEMENT  03/03/2022   Procedure: HEMOSTASIS CLIP PLACEMENT;  Surgeon: Kathi Der, MD;  Location: [facility];  Service: Gastroenterology;;   HERNIA REPAIR     HOT HEMOSTASIS N/A 03/03/2022   Procedure: HOT HEMOSTASIS (ARGON PLASMA COAGULATION/BICAP);  Surgeon: Kathi Der, MD;  Location: [facility];  Service: Gastroenterology;  Laterality: N/A;  EGD and COLON   INTRAOPERATIVE TRANSTHORACIC ECHOCARDIOGRAM N/A 04/15/2019   Procedure: Intraoperative Transthoracic Echocardiogram;  Surgeon: Tonny Bollman, MD;  Location: [facility] OR;  Service: Open Heart Surgery;  Laterality: N/A;   IRRIGATION AND DEBRIDEMENT SHOULDER Right 11/20/2018    IRRIGATION AND DEBRIDEMENT SHOULDER WITH POLY EXCHANGE (Right Shoulder)   IRRIGATION AND DEBRIDEMENT  SHOULDER Right 11/20/2018   Procedure: IRRIGATION AND DEBRIDEMENT SHOULDER WITH POLY EXCHANGE;  Surgeon: Beverely Low, MD;  Location: [facility] OR;  Service: Orthopedics;  Laterality: Right;   LUMBAR LAMINECTOMY     x 2   PACEMAKER IMPLANT N/A 05/12/2022   Procedure: PACEMAKER IMPLANT;  Surgeon: Marinus Maw, MD;  [facility];  Service: Cardiovascular;  Laterality: N/A;   POLYPECTOMY  03/03/2022   Procedure: POLYPECTOMY;  Surgeon: Kathi Der, MD;  Location: [facility];  Service: Gastroenterology;;   REVERSE SHOULDER ARTHROPLASTY Right 03/01/2018   Procedure: RIGHT REVERSE SHOULDER ARTHROPLASTY;  Surgeon: Beverely Low, MD;  Location: [facility] OR;  Service: Orthopedics;  Laterality: Right;   REVERSE SHOULDER ARTHROPLASTY Right 07/18/2019   Procedure: REVERSE TOTAL SHOULDER ARTHROPLASTY and removal of antiobotic spacer;  Surgeon: Beverely Low, MD;  Location: [facility];  Service: Orthopedics;  Laterality: Right;  interscalene  block   REVERSE SHOULDER ARTHROPLASTY Right 08/06/2019   Procedure: Reduction of dislocated reverse total shoulder and poly exchange;  Surgeon: Beverely Low, MD;  Location: [facility];  Service: Orthopedics;  Laterality: Right;  need 1 hour   RIGHT/LEFT HEART CATH AND CORONARY ANGIOGRAPHY N/A 04/02/2019   Procedure: RIGHT/LEFT HEART CATH AND CORONARY ANGIOGRAPHY;  Surgeon: Marykay Lex, MD;  Location: [facility] INVASIVE CV LAB;  Service: Cardiovascular;  Laterality: N/A;   SAVORY DILATION N/A 01/22/2018   Procedure: SAVORY DILATION;  Surgeon: Kerin Salen, MD;  Location: [facility];  Service: Gastroenterology;  Laterality: N/A;   SAVORY DILATION N/A 04/30/2019   Procedure: SAVORY DILATION;  Surgeon: Carman Ching, MD;  Location: [facility] ENDOSCOPY;  Service: Endoscopy;  Laterality: N/A;  With fluro   SHOULDER HEMI-ARTHROPLASTY Right 06/14/2018   Procedure: RIGHT  REVERSE TOTAL SHOULDER OPEN POLY EXCHANGE;  Surgeon: Beverely Low, MD;  Location: [facility] OR;  Service: Orthopedics;   Laterality: Right;   SUBMUCOSAL INJECTION  06/12/2019   Procedure: SUBMUCOSAL INJECTION;  Surgeon: Kerin Salen, MD;  Location: [facility];  Service: Gastroenterology;;   TEE WITHOUT CARDIOVERSION N/A 01/29/2014   Procedure: TRANSESOPHAGEAL ECHOCARDIOGRAM (TEE);  Surgeon: Quintella Reichert, MD;  Location: [facility] ENDOSCOPY;  Service: Cardiovascular;  Laterality: N/A;   TEE WITHOUT CARDIOVERSION N/A 04/02/2019   Procedure: TRANSESOPHAGEAL ECHOCARDIOGRAM (TEE);  Surgeon: Wendall Stade, MD;  Location: [facility] ENDOSCOPY;  Service: Cardiovascular;  Laterality: N/A;   TEE WITHOUT CARDIOVERSION N/A 09/25/2019   Procedure: TRANSESOPHAGEAL ECHOCARDIOGRAM (TEE);  Surgeon: Jake Bathe, MD;  Location: [facility] ENDOSCOPY;  Service: Cardiovascular;  Laterality: N/A;   TEE WITHOUT CARDIOVERSION N/A 03/08/2022   Procedure: TRANSESOPHAGEAL ECHOCARDIOGRAM (TEE);  Surgeon: Meriam Sprague, MD;  Location: [facility] ENDOSCOPY;  Service: Cardiovascular;  Laterality: N/A;   TONSILLECTOMY     TRANSCATHETER AORTIC VALVE REPLACEMENT, TRANSFEMORAL  04/15/2019   TRANSCATHETER AORTIC VALVE REPLACEMENT, TRANSFEMORAL N/A 04/15/2019   Procedure: TRANSCATHETER AORTIC VALVE REPLACEMENT, TRANSFEMORAL with POST BALLOON DILATION;  Surgeon: Tonny Bollman, MD;  Location: [facility] OR;  Service: Open Heart Surgery;  Laterality: N/A;     Current Meds  Medication Sig   cholestyramine (QUESTRAN) 4 g packet Take 4 g by mouth daily.   CREON 24000-76000 units CPEP as directed Orally three times a day   ELIQUIS 2.5 MG TABS tablet TAKE 1 TABLET(2.5 MG) BY MOUTH TWICE DAILY   epoetin alfa-epbx (RETACRIT) 96295 UNIT/ML injection Inject 40,000 Units into the skin every 6 (six) weeks. As needed   finasteride (PROSCAR) 5 MG tablet Take 5 mg by mouth daily.   fluticasone (FLONASE) 50 MCG/ACT nasal spray Place 1 spray into both nostrils daily as needed for allergies or rhinitis.   JARDIANCE 10 MG TABS tablet TAKE 1 TABLET(10 MG) BY MOUTH DAILY   ketotifen (ZADITOR) 0.025  % ophthalmic solution Place 1 drop into both eyes 2 (two) times daily as needed (itchy eyes).   levothyroxine (SYNTHROID) 88 MCG tablet Take 88 mcg by mouth every morning.   oxyCODONE (ROXICODONE) 5 MG immediate release tablet Take 1 tablet (5 mg total) by mouth every 4 (four) hours as needed for severe pain.   pantoprazole (PROTONIX) 40 MG tablet Take 1 tablet (40 mg total) by mouth 2 (two) times daily before a meal for 30 days, THEN 1 tablet (40 mg total) daily.   potassium chloride SA (KLOR-CON M) 20 MEQ tablet TAKE 1 TABLET(20 MEQ) BY MOUTH EVERY OTHER DAY   predniSONE (DELTASONE) 5 MG tablet Take 5 mg by mouth daily  with breakfast. TAKING 15mg  QD FOR 3Wks   torsemide (DEMADEX) 20 MG tablet TAKE 2 TABLETS BY MOUTH EVERY MORNING AND 1 TABLET IN THE EVENING     Allergies:   Patient has no known allergies.   Social History   Tobacco Use   Smoking status: Former    Current packs/day: 0.00    Average packs/day: 0.5 packs/day for 10.0 years (5.0 ttl pk-yrs)    Types: Cigarettes    Start date: 01/16/1974    Quit date: 09/05/1983    Years since quitting: 39.9   Smokeless tobacco: Former    Types: Associate Professor status: Never Used  Substance Use Topics   Alcohol use: Not Currently    Alcohol/week: 0.0 standard drinks of alcohol   Drug use: No     Family Hx: The patient's family history includes Arthritis in his father; Heart Problems in his sister; Heart disease in his father; Other in his brother, daughter, mother, and sister; Suicidality in his brother.  ROS:   Please see the history of present illness.     All other systems reviewed and are negative.   Labs/Other Tests and Data Reviewed:    Recent Labs: 12/27/2022: ALT 8; BUN 37; Creatinine 1.72; Potassium 3.5; Sodium 142 07/20/2023: Hemoglobin 10.3; Platelet Count 146   Recent Lipid Panel Lab Results  Component Value Date/Time   CHOL  01/14/2008 04:50 AM    174        ATP III CLASSIFICATION:  <200     mg/dL    Desirable  956-213  mg/dL   Borderline High  >=086    mg/dL   High   TRIG 43 57/84/6962 04:50 AM   HDL 65 01/14/2008 04:50 AM   CHOLHDL 2.7 01/14/2008 04:50 AM   LDLCALC (H) 01/14/2008 04:50 AM    100        Total Cholesterol/HDL:CHD Risk Coronary Heart Disease Risk Table                     Men   Women  1/2 Average Risk   3.4   3.3    Wt Readings from Last 3 Encounters:  07/24/23 111 lb 6.4 oz (50.5 kg)  07/05/23 112 lb 4.8 oz (50.9 kg)  06/14/23 103 lb (46.7 kg)     Objective:    Vital Signs:  BP 110/68   Pulse 82   Ht 5\' 3"  (1.6 m)   Wt 111 lb 6.4 oz (50.5 kg)   SpO2 96%   BMI 19.73 kg/m    GEN: Well nourished, well developed in no acute distress HEENT: Normal NECK: No JVD; No carotid bruits LYMPHATICS: No lymphadenopathy CARDIAC:RRR, no murmurs, rubs, gallops RESPIRATORY:  Clear to auscultation without rales, wheezing or rhonchi  ABDOMEN: Soft, non-tender, non-distended MUSCULOSKELETAL:  2+ LE pitting edema R>L No deformity  SKIN: Warm and dry NEUROLOGIC:  Alert and oriented x 3 PSYCHIATRIC:  Normal affect  ASSESSMENT & PLAN:    Aortic Insufficiency.Severe AS -severe bioprosthetic AI  -now s/p valve in valve TAVR in 04/2019 -2D echo 07/25/2023 showed EF 60% with stable TAVR valve in valve with a 26 mm ultra stented TAVR valve with mean gradient 10 mmHg and V-max 2.4 m/s.  There was no perivalvular AI  Mitral and tricuspid valve disease -2D echo 07/25/2023 showed myxomatous mitral and tricuspid valves with mild to moderate MR and TR -Will repeat 2D echo 07/24/2024   Non obstructive ASCAD -nonobstructive by cath  2009 with 20-30% OM2 -nonobstructive by coronary CTA 2015 in all 3 vessels -he has chronic DOE due to diastolic CHF which remains stable -He denies any anginal symptoms -cardiac cath at time of TAVR showed normal coronary arteries   SOB -this is chronic and suspect multifactorial from anemia, CHF, PAF  -normal functioning TAVR by echo  07/25/2023 -nonischemic DCM noted 09/2019 due to aflutter with RVR -EF 60% on echo 07/2023  LE edema -he has had problems with significant LE edema but does not tolerate compression hose and BP has been too low for additional demadex -I think he gets too much added Na in his diet from him salting his food and eating out a lot -encouraged him to follow a < 2gm Na diet -Continue prescription drug management Demadex 40 mg every morning and 20 mg every afternoon with as needed refills -he does not like thigh high compression hose -I have personally reviewed and interpreted outside labs performed by patient's PCP which showed serum creatinine 1.74 on 05/18/2023   Chronic  combined systolic/diastolic CHF/NICM -chronic SOB and LE edema likely multifactorial due to anemia, dietary indiscretion from Na, atrial flutter and anemia -EF 60% on echo 07/25/2023 -He appears euvolemic on exam today -Continue dietary restriction of Na and continue diuretics -continue Jardiacne 10mg  daily   Paroxysmal atrial flutter -s/p aflutter ablation -reverted back to aflutter and started on Amio -He has had cardioversions in the past with most recent TEE/DCCV 03/08/2022 with EARF and then recurrent afib s/p DCCV in late July 23 -Unfortunately continued to feel poorly and now is status post AV node ablation with permanent pacemaker on 05/12/2022 -Seen back in EP clinic on 06/15/2022 and was back in A-fib and found to be pacer dependent and not conducting status post AV node ablation -Continue drug management with Eliquis 2.5 mg twice daily (dosed for age > 80 and serum creatinine >1.5) with as needed refills -followed by EP  Medication Adjustments/Labs and Tests Ordered: Current medicines are reviewed at length with the patient today.  Concerns regarding medicines are outlined above.  Tests Ordered: No orders of the defined types were placed in this encounter.  Medication Changes: No orders of the defined types were  placed in this encounter.   Disposition:  Follow up 6 months  Signed, Armanda Magic, MD  07/24/2023 9:04 AM    [facility]

## 2023-07-24 NOTE — Patient Instructions (Signed)
Medication Instructions:  No changes *If you need a refill on your cardiac medications before your next appointment, please call your pharmacy*   Lab Work: none   Testing/Procedures: ECHO DUE IN NOVEMBER 2025 Your physician has requested that you have an echocardiogram. Echocardiography is a painless test that uses sound waves to create images of your heart. It provides your doctor with information about the size and shape of your heart and how well your heart's chambers and valves are working. This procedure takes approximately one hour. There are no restrictions for this procedure. Please do NOT wear cologne, perfume, aftershave, or lotions (deodorant is allowed). Please arrive 15 minutes prior to your appointment time.  Please note: We ask at that you not bring children with you during ultrasound (echo/ vascular) testing. Due to room size and safety concerns, children are not allowed in the ultrasound rooms during exams. Our front office staff cannot provide observation of children in our lobby area while testing is being conducted. An adult accompanying a patient to their appointment will only be allowed in the ultrasound room at the discretion of the ultrasound technician under special circumstances. We apologize for any inconvenience.   Follow-Up: At [facility], you and your health needs are our priority.  As part of our continuing mission to provide you with exceptional heart care, we have created designated Provider Care Teams.  These Care Teams include your primary Cardiologist (physician) and Advanced Practice Providers (APPs -  Physician Assistants and Nurse Practitioners) who all work together to provide you with the care you need, when you need it.   Your next appointment:   6 month(s)  Provider:   Armanda Magic, MD

## 2023-07-31 DIAGNOSIS — Z23 Encounter for immunization: Secondary | ICD-10-CM | POA: Diagnosis not present

## 2023-07-31 DIAGNOSIS — D61818 Other pancytopenia: Secondary | ICD-10-CM | POA: Diagnosis not present

## 2023-07-31 DIAGNOSIS — I482 Chronic atrial fibrillation, unspecified: Secondary | ICD-10-CM | POA: Diagnosis not present

## 2023-07-31 DIAGNOSIS — Z0000 Encounter for general adult medical examination without abnormal findings: Secondary | ICD-10-CM | POA: Diagnosis not present

## 2023-07-31 DIAGNOSIS — J479 Bronchiectasis, uncomplicated: Secondary | ICD-10-CM | POA: Diagnosis not present

## 2023-07-31 DIAGNOSIS — D469 Myelodysplastic syndrome, unspecified: Secondary | ICD-10-CM | POA: Diagnosis not present

## 2023-07-31 DIAGNOSIS — I700 Atherosclerosis of aorta: Secondary | ICD-10-CM | POA: Diagnosis not present

## 2023-07-31 DIAGNOSIS — I5030 Unspecified diastolic (congestive) heart failure: Secondary | ICD-10-CM | POA: Diagnosis not present

## 2023-07-31 DIAGNOSIS — J439 Emphysema, unspecified: Secondary | ICD-10-CM | POA: Diagnosis not present

## 2023-08-06 ENCOUNTER — Ambulatory Visit (HOSPITAL_COMMUNITY)
Admission: RE | Admit: 2023-08-06 | Discharge: 2023-08-06 | Disposition: A | Discharge status: Home / Self Care | Payer: Medicare PPO | Source: Ambulatory Visit | Attending: Family Medicine

## 2023-08-06 ENCOUNTER — Ambulatory Visit (HOSPITAL_COMMUNITY)
Admission: RE | Admit: 2023-08-06 | Discharge: 2023-08-06 | Disposition: A | Discharge status: Home / Self Care | Payer: Medicare PPO | Source: Ambulatory Visit | Attending: Gastroenterology | Admitting: Gastroenterology

## 2023-08-06 DIAGNOSIS — R059 Cough, unspecified: Secondary | ICD-10-CM | POA: Insufficient documentation

## 2023-08-06 DIAGNOSIS — R1310 Dysphagia, unspecified: Secondary | ICD-10-CM

## 2023-08-06 DIAGNOSIS — R638 Other symptoms and signs concerning food and fluid intake: Secondary | ICD-10-CM | POA: Diagnosis not present

## 2023-08-06 NOTE — Progress Notes (Signed)
Modified Barium Swallow Study  Patient Details  Name: Joshua Hudson MRN: [id number] Date of Birth: 1935/05/27  Today's Date: 08/06/2023  Modified Barium Swallow completed.  Full report located under Chart Review in the Imaging Section.  History of Present Illness Pt is an 88 year old male with a history of achalasia, referred for MBS after esopahgram on 07/17/23 showed  Aspiration of a small amount of barium to the level of the mid trachea with cough response. Also moderate esophageal dysmotility and unchanged mild dilatation of the upper to mid esophagus without definite stricture, favored related to dysmotility of the mid to distal esophagus. Pt underwent esopahgeal dilatation 06/12/23 without relief of dysphagia.  Endoscopy showed dialtion of upper and middle portions of the esophagus and a Schatzki's ring at GE junction, dilated, injected with botox.   Clinical Impression Pt demonstrates delayed swallow initiation, mild base of tongue weakness. Pt has sensed frank penetration or possible trace sensed aspiration with consistent cough and ejection, due to late airway protection. There is also mild base of tongue and vallecular residue that requires a second swallow at times. Pt has constant throat clearing as well, likely due to his long history of esophageal dysphagia. When he penetrates/aspirates, his cough is more pronounced and overt that the throat clearing behavior. Pt was 100% effective with a supraglottic swallow strategy with thin and nectar thick liquid. He could not carry it over without max to moderate verbal cues. He also struggled to verbally restate the strategy. SLP provided it in writing with extensive visual feedback with images. Pt shook his head no to the suggestion of f/u with OP SLP, but a few sessions could help carry over the strategy and also start to target pts throat clearing behavior, though esophageal dysphagia will persist. Factors that may increase risk of adverse  event in presence of aspiration Rubye Oaks & Clearance Coots 2021):    Swallow Evaluation Recommendations Recommendations: PO diet PO Diet Recommendation: Regular;Thin liquids (Level 0) Liquid Administration via: Cup;Straw Medication Administration: Whole meds with liquid Supervision: Patient able to self-feed Swallowing strategies  : Slow rate;Small bites/sips;Hold breath before and during swallow (supraglottic swallow) Postural changes: Position pt fully upright for meals Oral care recommendations: Oral care BID (2x/day)      Lurlie Wigen, Riley Nearing 08/06/2023,12:29 PM

## 2023-08-07 DIAGNOSIS — H353211 Exudative age-related macular degeneration, right eye, with active choroidal neovascularization: Secondary | ICD-10-CM | POA: Diagnosis not present

## 2023-08-08 NOTE — Assessment & Plan Note (Signed)
Hemoglobin electrophoresis: Beta thalassemia minor.    Hospitalization 09/22/2019-09/25/2019: A. fib with RVR status post cardioversion, currently on Eliquis Hospitalization 02/28/2022-03/11/2022: Atrial fibrillation: Cardioversion planned for 03/22/2022. 05/12/22: Ablation and Pacemaker Implantation  06/12/2023: EGD with Botox   Lab review: 09/25/2019: WBC 7.2, hemoglobin 9.6, platelets 145 04/14/22: Hb 7.8, Platelets 91 (PRBC) 06/14/22: Hb 10.4, Pl 151, Cr 1.66, Iron sat: 16%, Ferritin 287 07/05/22: Hemoglobin 9.7 08/14/22: Hemoglobin 7.4 09/26/2022: Hemoglobin 9.1 (Retacrit injection today) 04/12/2023: Hemoglobin 10.4 (Retacrit and starting B12 injections) 05/24/2023: Hemoglobin 9.9 (Retacrit, B12 and IV iron) 07/05/2023: Hemoglobin 10.8 08/08/2023: Hemoglobin   IV iron: March 2024, September 2024   Current treatment:  Retacrit will be held today Every 3 week B12 injections     He will come back again in 3 weeks for lab check and transfusions or Retacrit as needed Every 6 weeks for MD visits

## 2023-08-10 ENCOUNTER — Inpatient Hospital Stay: Payer: Medicare PPO | Admitting: Hematology and Oncology

## 2023-08-10 ENCOUNTER — Inpatient Hospital Stay: Payer: Medicare PPO

## 2023-08-10 ENCOUNTER — Inpatient Hospital Stay: Payer: Medicare PPO | Attending: Hematology and Oncology

## 2023-08-10 VITALS — BP 138/77 | HR 91 | Temp 97.8°F | Resp 18 | Ht 63.0 in | Wt 112.9 lb

## 2023-08-10 DIAGNOSIS — I4891 Unspecified atrial fibrillation: Secondary | ICD-10-CM | POA: Diagnosis not present

## 2023-08-10 DIAGNOSIS — D638 Anemia in other chronic diseases classified elsewhere: Secondary | ICD-10-CM | POA: Insufficient documentation

## 2023-08-10 DIAGNOSIS — D563 Thalassemia minor: Secondary | ICD-10-CM | POA: Diagnosis not present

## 2023-08-10 DIAGNOSIS — Z79899 Other long term (current) drug therapy: Secondary | ICD-10-CM | POA: Diagnosis not present

## 2023-08-10 DIAGNOSIS — Z7901 Long term (current) use of anticoagulants: Secondary | ICD-10-CM | POA: Insufficient documentation

## 2023-08-10 DIAGNOSIS — D649 Anemia, unspecified: Secondary | ICD-10-CM | POA: Diagnosis not present

## 2023-08-10 DIAGNOSIS — D598 Other acquired hemolytic anemias: Secondary | ICD-10-CM

## 2023-08-10 LAB — CBC WITH DIFFERENTIAL (CANCER CENTER ONLY)
Abs Immature Granulocytes: 0.06 10*3/uL (ref 0.00–0.07)
Basophils Absolute: 0 10*3/uL (ref 0.0–0.1)
Basophils Relative: 0 %
Eosinophils Absolute: 0.2 10*3/uL (ref 0.0–0.5)
Eosinophils Relative: 1 %
HCT: 36.6 % — ABNORMAL LOW (ref 39.0–52.0)
Hemoglobin: 11.2 g/dL — ABNORMAL LOW (ref 13.0–17.0)
Immature Granulocytes: 1 %
Lymphocytes Relative: 7 %
Lymphs Abs: 0.8 10*3/uL (ref 0.7–4.0)
MCH: 21.3 pg — ABNORMAL LOW (ref 26.0–34.0)
MCHC: 30.6 g/dL (ref 30.0–36.0)
MCV: 69.7 fL — ABNORMAL LOW (ref 80.0–100.0)
Monocytes Absolute: 0.7 10*3/uL (ref 0.1–1.0)
Monocytes Relative: 6 %
Neutro Abs: 9.2 10*3/uL — ABNORMAL HIGH (ref 1.7–7.7)
Neutrophils Relative %: 85 %
Platelet Count: 153 10*3/uL (ref 150–400)
RBC: 5.25 MIL/uL (ref 4.22–5.81)
RDW: 17 % — ABNORMAL HIGH (ref 11.5–15.5)
WBC Count: 10.9 10*3/uL — ABNORMAL HIGH (ref 4.0–10.5)
nRBC: 0 % (ref 0.0–0.2)

## 2023-08-10 LAB — SAMPLE TO BLOOD BANK

## 2023-08-10 LAB — IRON AND IRON BINDING CAPACITY (CC-WL,HP ONLY)
Iron: 111 ug/dL (ref 45–182)
Saturation Ratios: 38 % (ref 17.9–39.5)
TIBC: 294 ug/dL (ref 250–450)
UIBC: 183 ug/dL (ref 117–376)

## 2023-08-10 LAB — FERRITIN: Ferritin: 84 ng/mL (ref 24–336)

## 2023-08-10 LAB — VITAMIN B12: Vitamin B-12: 334 pg/mL (ref 180–914)

## 2023-08-10 NOTE — Progress Notes (Signed)
Patient Care Team: Henrine Screws, MD as PCP - General (Family Medicine) Quintella Reichert, MD as PCP - Cardiology (Cardiology) Marinus Maw, MD as PCP - Electrophysiology (Cardiology) Quintella Reichert, MD as Consulting Physician (Cardiology) Serena Croissant, MD as Consulting Physician (Hematology and Oncology)  DIAGNOSIS:  Encounter Diagnosis  Name Primary?   Anemia of chronic disease Yes    CHIEF COMPLIANT: Follow-up of anemia of chronic disease  HISTORY OF PRESENT ILLNESS:   History of Present Illness   The patient presents for a follow-up visit after receiving aranesp three weeks ago for anemia. today his hemoglobin level has increased from 10.3 to 11.2, indicating a positive response to the treatment. The patient's iron and B12 levels were also reported to be in good shape during the last visit, and he does not require any additional iron supplementation at this point. The patient's platelet count is also reported to be satisfactory. He continues to feel fatigued.         ALLERGIES:  has No Known Allergies.  MEDICATIONS:  Current Outpatient Medications  Medication Sig Dispense Refill   cholestyramine (QUESTRAN) 4 g packet Take 4 g by mouth daily.     CREON 24000-76000 units CPEP as directed Orally three times a day     ELIQUIS 2.5 MG TABS tablet TAKE 1 TABLET(2.5 MG) BY MOUTH TWICE DAILY 180 tablet 1   epoetin alfa-epbx (RETACRIT) 91478 UNIT/ML injection Inject 40,000 Units into the skin every 6 (six) weeks. As needed     finasteride (PROSCAR) 5 MG tablet Take 5 mg by mouth daily.     fluticasone (FLONASE) 50 MCG/ACT nasal spray Place 1 spray into both nostrils daily as needed for allergies or rhinitis.     JARDIANCE 10 MG TABS tablet TAKE 1 TABLET(10 MG) BY MOUTH DAILY 90 tablet 2   ketotifen (ZADITOR) 0.025 % ophthalmic solution Place 1 drop into both eyes 2 (two) times daily as needed (itchy eyes).     levothyroxine (SYNTHROID) 88 MCG tablet Take 88 mcg by mouth every  morning.     oxyCODONE (ROXICODONE) 5 MG immediate release tablet Take 1 tablet (5 mg total) by mouth every 4 (four) hours as needed for severe pain. 10 tablet 0   pantoprazole (PROTONIX) 40 MG tablet Take 1 tablet (40 mg total) by mouth 2 (two) times daily before a meal for 30 days, THEN 1 tablet (40 mg total) daily. 120 tablet 0   potassium chloride SA (KLOR-CON M) 20 MEQ tablet TAKE 1 TABLET(20 MEQ) BY MOUTH EVERY OTHER DAY 45 tablet 1   predniSONE (DELTASONE) 5 MG tablet Take 5 mg by mouth daily with breakfast. TAKING 15mg  QD FOR 3Wks     torsemide (DEMADEX) 20 MG tablet TAKE 2 TABLETS BY MOUTH EVERY MORNING AND 1 TABLET IN THE EVENING 270 tablet 1   No current facility-administered medications for this visit.    PHYSICAL EXAMINATION: ECOG PERFORMANCE STATUS: 1 - Symptomatic but completely ambulatory  Vitals:   08/10/23 0807  BP: 138/77  Pulse: 91  Resp: 18  Temp: 97.8 F (36.6 C)  SpO2: 99%   Filed Weights   08/10/23 0807  Weight: 112 lb 14.4 oz (51.2 kg)      LABORATORY DATA:  I have reviewed the data as listed    Latest Ref Rng & Units 12/27/2022    7:37 AM 12/06/2022    7:23 AM 11/07/2022    7:21 AM  CMP  Glucose 70 - 99 mg/dL 97  110  89   BUN 8 - 23 mg/dL 37  33  25   Creatinine 0.61 - 1.24 mg/dL 5.62  1.30  8.65   Sodium 135 - 145 mmol/L 142  140  142   Potassium 3.5 - 5.1 mmol/L 3.5  3.9  3.4   Chloride 98 - 111 mmol/L 103  104  102   CO2 22 - 32 mmol/L 30  30  32   Calcium 8.9 - 10.3 mg/dL 9.5  9.5  9.6   Total Protein 6.5 - 8.1 g/dL 6.4  6.1  6.6   Total Bilirubin 0.3 - 1.2 mg/dL 0.4  0.5  0.4   Alkaline Phos 38 - 126 U/L 59  56  54   AST 15 - 41 U/L 16  16  15    ALT 0 - 44 U/L 8  9  8      Lab Results  Component Value Date   WBC 10.9 (H) 08/10/2023   HGB 11.2 (L) 08/10/2023   HCT 36.6 (L) 08/10/2023   MCV 69.7 (L) 08/10/2023   PLT 153 08/10/2023   NEUTROABS 9.2 (H) 08/10/2023    ASSESSMENT & PLAN:  Anemia of chronic disease Hemoglobin  electrophoresis: Beta thalassemia minor.    Hospitalization 09/22/2019-09/25/2019: A. fib with RVR status post cardioversion, currently on Eliquis Hospitalization 02/28/2022-03/11/2022: Atrial fibrillation: Cardioversion planned for 03/22/2022. 05/12/22: Ablation and Pacemaker Implantation  06/12/2023: EGD with Botox   Lab review: 09/25/2019: WBC 7.2, hemoglobin 9.6, platelets 145 04/14/22: Hb 7.8, Platelets 91 (PRBC) 06/14/22: Hb 10.4, Pl 151, Cr 1.66, Iron sat: 16%, Ferritin 287 07/05/22: Hemoglobin 9.7 08/14/22: Hemoglobin 7.4 09/26/2022: Hemoglobin 9.1 (Retacrit injection today) 04/12/2023: Hemoglobin 10.4 (Retacrit and starting B12 injections) 05/24/2023: Hemoglobin 9.9 (Retacrit, B12 and IV iron) 07/05/2023: Hemoglobin 10.8 08/08/2023: Hemoglobin   IV iron: March 2024, September 2024   Current treatment:  Retacrit will be held today  B12 injections in 3 weeks     He will come back again in 3 weeks for lab check and transfusions or Retacrit as needed      Anemia Hemoglobin improved from 10.3 to 11.2 after administration of an unspecified injection three weeks ago. Iron and B12 levels were normal at last check. -No injection needed today.  No need of blood transfusions. -Continue current management plan. -Follow-up appointment scheduled for December 27th, January 17th, and February 6th.      No orders of the defined types were placed in this encounter.  The patient has a good understanding of the overall plan. he agrees with it. he will call with any problems that may develop before the next visit here. Total time spent: 30 mins including face to face time and time spent for planning, charting and co-ordination of care   Tamsen Meek, MD 08/10/23

## 2023-08-31 ENCOUNTER — Inpatient Hospital Stay: Payer: Medicare PPO

## 2023-08-31 VITALS — BP 137/78 | HR 85 | Temp 97.8°F | Resp 18

## 2023-08-31 DIAGNOSIS — D638 Anemia in other chronic diseases classified elsewhere: Secondary | ICD-10-CM

## 2023-08-31 DIAGNOSIS — Z79899 Other long term (current) drug therapy: Secondary | ICD-10-CM | POA: Diagnosis not present

## 2023-08-31 DIAGNOSIS — D598 Other acquired hemolytic anemias: Secondary | ICD-10-CM

## 2023-08-31 DIAGNOSIS — Z7901 Long term (current) use of anticoagulants: Secondary | ICD-10-CM | POA: Diagnosis not present

## 2023-08-31 DIAGNOSIS — D563 Thalassemia minor: Secondary | ICD-10-CM | POA: Diagnosis not present

## 2023-08-31 DIAGNOSIS — I4891 Unspecified atrial fibrillation: Secondary | ICD-10-CM | POA: Diagnosis not present

## 2023-08-31 LAB — CBC WITH DIFFERENTIAL (CANCER CENTER ONLY)
Abs Immature Granulocytes: 0.07 10*3/uL (ref 0.00–0.07)
Basophils Absolute: 0 10*3/uL (ref 0.0–0.1)
Basophils Relative: 0 %
Eosinophils Absolute: 0.1 10*3/uL (ref 0.0–0.5)
Eosinophils Relative: 1 %
HCT: 34.4 % — ABNORMAL LOW (ref 39.0–52.0)
Hemoglobin: 10.7 g/dL — ABNORMAL LOW (ref 13.0–17.0)
Immature Granulocytes: 1 %
Lymphocytes Relative: 8 %
Lymphs Abs: 0.8 10*3/uL (ref 0.7–4.0)
MCH: 21.3 pg — ABNORMAL LOW (ref 26.0–34.0)
MCHC: 31.1 g/dL (ref 30.0–36.0)
MCV: 68.5 fL — ABNORMAL LOW (ref 80.0–100.0)
Monocytes Absolute: 0.5 10*3/uL (ref 0.1–1.0)
Monocytes Relative: 5 %
Neutro Abs: 8.3 10*3/uL — ABNORMAL HIGH (ref 1.7–7.7)
Neutrophils Relative %: 85 %
Platelet Count: 139 10*3/uL — ABNORMAL LOW (ref 150–400)
RBC: 5.02 MIL/uL (ref 4.22–5.81)
RDW: 16.2 % — ABNORMAL HIGH (ref 11.5–15.5)
WBC Count: 9.8 10*3/uL (ref 4.0–10.5)
nRBC: 0 % (ref 0.0–0.2)

## 2023-08-31 LAB — SAMPLE TO BLOOD BANK

## 2023-08-31 LAB — VITAMIN B12: Vitamin B-12: 296 pg/mL (ref 180–914)

## 2023-08-31 LAB — IRON AND IRON BINDING CAPACITY (CC-WL,HP ONLY)
Iron: 84 ug/dL (ref 45–182)
Saturation Ratios: 31 % (ref 17.9–39.5)
TIBC: 273 ug/dL (ref 250–450)
UIBC: 189 ug/dL (ref 117–376)

## 2023-08-31 LAB — FERRITIN: Ferritin: 53 ng/mL (ref 24–336)

## 2023-08-31 MED ORDER — EPOETIN ALFA-EPBX 40000 UNIT/ML IJ SOLN
40000.0000 [IU] | Freq: Once | INTRAMUSCULAR | Status: AC
Start: 2023-08-31 — End: 2023-08-31
  Administered 2023-08-31: 40000 [IU] via SUBCUTANEOUS
  Filled 2023-08-31: qty 1

## 2023-08-31 MED ORDER — CYANOCOBALAMIN 1000 MCG/ML IJ SOLN
1000.0000 ug | Freq: Once | INTRAMUSCULAR | Status: AC
  Administered 2023-08-31: 1000 ug via INTRAMUSCULAR
  Filled 2023-08-31: qty 1

## 2023-09-13 ENCOUNTER — Ambulatory Visit (INDEPENDENT_AMBULATORY_CARE_PROVIDER_SITE_OTHER): Payer: Medicare PPO

## 2023-09-13 DIAGNOSIS — I4892 Unspecified atrial flutter: Secondary | ICD-10-CM

## 2023-09-13 LAB — CUP PACEART REMOTE DEVICE CHECK
Battery Remaining Longevity: 139 mo
Battery Voltage: 3.03 V
Brady Statistic RA Percent Paced: 0.39 %
Brady Statistic RV Percent Paced: 99.67 %
Date Time Interrogation Session: 20250108212535
Implantable Lead Connection Status: 753985
Implantable Lead Connection Status: 753985
Implantable Lead Implant Date: 20230908
Implantable Lead Implant Date: 20230908
Implantable Lead Location: 753859
Implantable Lead Location: 753860
Implantable Lead Model: 3830
Implantable Lead Model: 5076
Implantable Pulse Generator Implant Date: 20230908
Lead Channel Impedance Value: 285 Ohm
Lead Channel Impedance Value: 342 Ohm
Lead Channel Impedance Value: 475 Ohm
Lead Channel Impedance Value: 475 Ohm
Lead Channel Pacing Threshold Amplitude: 0.625 V
Lead Channel Pacing Threshold Amplitude: 0.75 V
Lead Channel Pacing Threshold Pulse Width: 0.4 ms
Lead Channel Pacing Threshold Pulse Width: 0.4 ms
Lead Channel Sensing Intrinsic Amplitude: 1.875 mV
Lead Channel Sensing Intrinsic Amplitude: 1.875 mV
Lead Channel Sensing Intrinsic Amplitude: 16.625 mV
Lead Channel Sensing Intrinsic Amplitude: 16.625 mV
Lead Channel Setting Pacing Amplitude: 2 V
Lead Channel Setting Pacing Amplitude: 3 V
Lead Channel Setting Pacing Pulse Width: 0.4 ms
Lead Channel Setting Sensing Sensitivity: 1.2 mV
Zone Setting Status: 755011

## 2023-09-21 ENCOUNTER — Inpatient Hospital Stay: Payer: Medicare PPO | Attending: Hematology and Oncology

## 2023-09-21 ENCOUNTER — Inpatient Hospital Stay: Payer: Medicare PPO

## 2023-09-21 VITALS — BP 131/71 | HR 79 | Temp 97.7°F | Resp 17

## 2023-09-21 DIAGNOSIS — D598 Other acquired hemolytic anemias: Secondary | ICD-10-CM

## 2023-09-21 DIAGNOSIS — D638 Anemia in other chronic diseases classified elsewhere: Secondary | ICD-10-CM | POA: Diagnosis not present

## 2023-09-21 DIAGNOSIS — E538 Deficiency of other specified B group vitamins: Secondary | ICD-10-CM | POA: Diagnosis not present

## 2023-09-21 DIAGNOSIS — D563 Thalassemia minor: Secondary | ICD-10-CM | POA: Diagnosis not present

## 2023-09-21 LAB — CBC WITH DIFFERENTIAL (CANCER CENTER ONLY)
Abs Immature Granulocytes: 0.05 10*3/uL (ref 0.00–0.07)
Basophils Absolute: 0 10*3/uL (ref 0.0–0.1)
Basophils Relative: 1 %
Eosinophils Absolute: 0.2 10*3/uL (ref 0.0–0.5)
Eosinophils Relative: 2 %
HCT: 35.5 % — ABNORMAL LOW (ref 39.0–52.0)
Hemoglobin: 11.1 g/dL — ABNORMAL LOW (ref 13.0–17.0)
Immature Granulocytes: 1 %
Lymphocytes Relative: 13 %
Lymphs Abs: 1.1 10*3/uL (ref 0.7–4.0)
MCH: 21.5 pg — ABNORMAL LOW (ref 26.0–34.0)
MCHC: 31.3 g/dL (ref 30.0–36.0)
MCV: 68.7 fL — ABNORMAL LOW (ref 80.0–100.0)
Monocytes Absolute: 0.8 10*3/uL (ref 0.1–1.0)
Monocytes Relative: 10 %
Neutro Abs: 6 10*3/uL (ref 1.7–7.7)
Neutrophils Relative %: 73 %
Platelet Count: 157 10*3/uL (ref 150–400)
RBC: 5.17 MIL/uL (ref 4.22–5.81)
RDW: 15.9 % — ABNORMAL HIGH (ref 11.5–15.5)
WBC Count: 8.2 10*3/uL (ref 4.0–10.5)
nRBC: 0 % (ref 0.0–0.2)

## 2023-09-21 LAB — IRON AND IRON BINDING CAPACITY (CC-WL,HP ONLY)
Iron: 105 ug/dL (ref 45–182)
Saturation Ratios: 35 % (ref 17.9–39.5)
TIBC: 297 ug/dL (ref 250–450)
UIBC: 192 ug/dL (ref 117–376)

## 2023-09-21 LAB — SAMPLE TO BLOOD BANK

## 2023-09-21 LAB — FERRITIN: Ferritin: 51 ng/mL (ref 24–336)

## 2023-09-21 LAB — VITAMIN B12: Vitamin B-12: 408 pg/mL (ref 180–914)

## 2023-09-21 MED ORDER — EPOETIN ALFA-EPBX 40000 UNIT/ML IJ SOLN: 40000.0000 [IU] | Freq: Once | INTRAMUSCULAR | Status: DC

## 2023-09-21 MED ORDER — CYANOCOBALAMIN 1000 MCG/ML IJ SOLN
1000.0000 ug | Freq: Once | INTRAMUSCULAR | Status: AC
  Administered 2023-09-21: 1000 ug via INTRAMUSCULAR
  Filled 2023-09-21: qty 1

## 2023-09-23 ENCOUNTER — Other Ambulatory Visit: Payer: Self-pay | Admitting: Internal Medicine

## 2023-10-02 DIAGNOSIS — H353211 Exudative age-related macular degeneration, right eye, with active choroidal neovascularization: Secondary | ICD-10-CM | POA: Diagnosis not present

## 2023-10-11 ENCOUNTER — Inpatient Hospital Stay: Payer: Medicare PPO | Attending: Hematology and Oncology

## 2023-10-11 ENCOUNTER — Inpatient Hospital Stay: Payer: Medicare PPO

## 2023-10-11 ENCOUNTER — Inpatient Hospital Stay: Payer: Medicare PPO | Admitting: Hematology and Oncology

## 2023-10-11 VITALS — BP 150/75 | HR 88 | Temp 97.2°F | Resp 18 | Ht 63.0 in | Wt 114.6 lb

## 2023-10-11 DIAGNOSIS — Z7901 Long term (current) use of anticoagulants: Secondary | ICD-10-CM | POA: Diagnosis not present

## 2023-10-11 DIAGNOSIS — E538 Deficiency of other specified B group vitamins: Secondary | ICD-10-CM | POA: Insufficient documentation

## 2023-10-11 DIAGNOSIS — D563 Thalassemia minor: Secondary | ICD-10-CM | POA: Insufficient documentation

## 2023-10-11 DIAGNOSIS — D638 Anemia in other chronic diseases classified elsewhere: Secondary | ICD-10-CM | POA: Diagnosis not present

## 2023-10-11 DIAGNOSIS — I4891 Unspecified atrial fibrillation: Secondary | ICD-10-CM | POA: Diagnosis not present

## 2023-10-11 DIAGNOSIS — D598 Other acquired hemolytic anemias: Secondary | ICD-10-CM

## 2023-10-11 LAB — CBC WITH DIFFERENTIAL (CANCER CENTER ONLY)
Abs Immature Granulocytes: 0.05 10*3/uL (ref 0.00–0.07)
Basophils Absolute: 0 10*3/uL (ref 0.0–0.1)
Basophils Relative: 0 %
Eosinophils Absolute: 0.2 10*3/uL (ref 0.0–0.5)
Eosinophils Relative: 2 %
HCT: 35.7 % — ABNORMAL LOW (ref 39.0–52.0)
Hemoglobin: 10.7 g/dL — ABNORMAL LOW (ref 13.0–17.0)
Immature Granulocytes: 1 %
Lymphocytes Relative: 11 %
Lymphs Abs: 0.9 10*3/uL (ref 0.7–4.0)
MCH: 20.7 pg — ABNORMAL LOW (ref 26.0–34.0)
MCHC: 30 g/dL (ref 30.0–36.0)
MCV: 69.1 fL — ABNORMAL LOW (ref 80.0–100.0)
Monocytes Absolute: 0.6 10*3/uL (ref 0.1–1.0)
Monocytes Relative: 7 %
Neutro Abs: 7.1 10*3/uL (ref 1.7–7.7)
Neutrophils Relative %: 79 %
Platelet Count: 159 10*3/uL (ref 150–400)
RBC: 5.17 MIL/uL (ref 4.22–5.81)
RDW: 16 % — ABNORMAL HIGH (ref 11.5–15.5)
WBC Count: 8.8 10*3/uL (ref 4.0–10.5)
nRBC: 0 % (ref 0.0–0.2)

## 2023-10-11 LAB — SAMPLE TO BLOOD BANK

## 2023-10-11 LAB — IRON AND IRON BINDING CAPACITY (CC-WL,HP ONLY)
Iron: 60 ug/dL (ref 45–182)
Saturation Ratios: 18 % (ref 17.9–39.5)
TIBC: 340 ug/dL (ref 250–450)
UIBC: 280 ug/dL (ref 117–376)

## 2023-10-11 LAB — VITAMIN B12: Vitamin B-12: 490 pg/mL (ref 180–914)

## 2023-10-11 LAB — FERRITIN: Ferritin: 27 ng/mL (ref 24–336)

## 2023-10-11 MED ORDER — EPOETIN ALFA-EPBX 40000 UNIT/ML IJ SOLN
40000.0000 [IU] | Freq: Once | INTRAMUSCULAR | Status: AC
  Administered 2023-10-11: 40000 [IU] via SUBCUTANEOUS
  Filled 2023-10-11: qty 1

## 2023-10-11 MED ORDER — CYANOCOBALAMIN 1000 MCG/ML IJ SOLN
1000.0000 ug | Freq: Once | INTRAMUSCULAR | Status: AC
  Administered 2023-10-11: 1000 ug via INTRAMUSCULAR
  Filled 2023-10-11: qty 1

## 2023-10-11 NOTE — Progress Notes (Signed)
 Patient Care Team: [name], MD as PCP - General (Family Medicine) [name], MD as PCP - Cardiology (Cardiology) [name], MD as PCP - Electrophysiology (Cardiology) [name], MD as Consulting Physician (Cardiology) [name], MD as Consulting Physician (Hematology and Oncology)  DIAGNOSIS:  Encounter Diagnosis  Name Primary?   Anemia of chronic disease Yes    CHIEF COMPLIANT: Follow-up of anemia of chronic disease  HISTORY OF PRESENT ILLNESS:   History of Present Illness   Joshua Hudson is an 89 year old male who presents for a follow-up regarding his anemia management.  He has been receiving injections with varying doses of ten, seven, eleven, and one, which have been effective in maintaining his hemoglobin levels. His current hemoglobin level is 10.7. He receives B12 and other injections as part of his treatment regimen.  His daily activities are limited, primarily involving sedentary behavior. On good days, he is able to move around the house, but generally lacks energy. His energy levels remain unchanged, with a slight increase following injections.         ALLERGIES:  has no known allergies.  MEDICATIONS:  Current Outpatient Medications  Medication Sig Dispense Refill   cholestyramine (QUESTRAN) 4 g packet Take 4 g by mouth daily.     CREON 24000-76000 units CPEP as directed Orally three times a day     ELIQUIS  2.5 MG TABS tablet TAKE 1 TABLET(2.5 MG) BY MOUTH TWICE DAILY 180 tablet 1   epoetin  alfa-epbx (RETACRIT ) 40000 UNIT/ML injection Inject 40,000 Units into the skin every 6 (six) weeks. As needed     finasteride  (PROSCAR ) 5 MG tablet Take 5 mg by mouth daily.     fluticasone (FLONASE) 50 MCG/ACT nasal spray Place 1 spray into both nostrils daily as needed for allergies or rhinitis.     JARDIANCE  10 MG TABS tablet TAKE 1 TABLET(10 MG) BY MOUTH DAILY 90 tablet 2   ketotifen  (ZADITOR ) 0.025 % ophthalmic solution Place 1 drop  into both eyes 2 (two) times daily as needed (itchy eyes).     levothyroxine  (SYNTHROID ) 88 MCG tablet Take 88 mcg by mouth every morning.     oxyCODONE  (ROXICODONE ) 5 MG immediate release tablet Take 1 tablet (5 mg total) by mouth every 4 (four) hours as needed for severe pain. 10 tablet 0   pantoprazole  (PROTONIX ) 40 MG tablet Take 1 tablet (40 mg total) by mouth 2 (two) times daily before a meal for 30 days, THEN 1 tablet (40 mg total) daily. 120 tablet 0   potassium chloride  SA (KLOR-CON  M) 20 MEQ tablet TAKE 1 TABLET(20 MEQ) BY MOUTH EVERY OTHER DAY 45 tablet 3   predniSONE  (DELTASONE ) 5 MG tablet Take 5 mg by mouth daily with breakfast. TAKING 15mg  QD FOR 3Wks     torsemide  (DEMADEX ) 20 MG tablet TAKE 2 TABLETS BY MOUTH EVERY MORNING AND 1 TABLET IN THE EVENING 270 tablet 1   No current facility-administered medications for this visit.    PHYSICAL EXAMINATION: ECOG PERFORMANCE STATUS: 1 - Symptomatic but completely ambulatory  Vitals:   10/11/23 0807  BP: (!) 150/75  Pulse: 88  Resp: 18  Temp: (!) 97.2 F (36.2 C)  SpO2: 100%   Filed Weights   10/11/23 0807  Weight: 114 lb 9.6 oz (52 kg)      LABORATORY DATA:  I have reviewed the data as listed    Latest Ref Rng & Units 12/27/2022    7:37 AM  12/06/2022    7:23 AM 11/07/2022    7:21 AM  CMP  Glucose 70 - 99 mg/dL 97  889  89   BUN 8 - 23 mg/dL 37  33  25   Creatinine 0.61 - 1.24 mg/dL 8.27  7.75  8.32   Sodium 135 - 145 mmol/L 142  140  142   Potassium 3.5 - 5.1 mmol/L 3.5  3.9  3.4   Chloride 98 - 111 mmol/L 103  104  102   CO2 22 - 32 mmol/L 30  30  32   Calcium 8.9 - 10.3 mg/dL 9.5  9.5  9.6   Total Protein 6.5 - 8.1 g/dL 6.4  6.1  6.6   Total Bilirubin 0.3 - 1.2 mg/dL 0.4  0.5  0.4   Alkaline Phos 38 - 126 U/L 59  56  54   AST 15 - 41 U/L 16  16  15    ALT 0 - 44 U/L 8  9  8      Lab Results  Component Value Date   WBC 8.8 10/11/2023   HGB 10.7 (L) 10/11/2023   HCT 35.7 (L) 10/11/2023   MCV 69.1 (L)  10/11/2023   PLT 159 10/11/2023   NEUTROABS 7.1 10/11/2023    ASSESSMENT & PLAN:  Anemia of chronic disease Hemoglobin electrophoresis: Beta thalassemia minor.    Hospitalization 09/22/2019-09/25/2019: A. fib with RVR status post cardioversion, currently on Eliquis  Hospitalization 02/28/2022-03/11/2022: Atrial fibrillation: Cardioversion planned for 03/22/2022. 05/12/22: Ablation and Pacemaker Implantation  06/12/2023: EGD with Botox    Lab review: 09/25/2019: WBC 7.2, hemoglobin 9.6, platelets 145 04/14/22: Hb 7.8, Platelets 91 (PRBC) 06/14/22: Hb 10.4, Pl 151, Cr 1.66, Iron  sat: 16%, Ferritin 287 07/05/22: Hemoglobin 9.7 08/14/22: Hemoglobin 7.4 09/26/2022: Hemoglobin 9.1 (Retacrit  injection today) 04/12/2023: Hemoglobin 10.4 (Retacrit  and starting B12 injections) 05/24/2023: Hemoglobin 9.9 (Retacrit , B12 and IV iron ) 07/05/2023: Hemoglobin 10.8 08/08/2023: Hemoglobin 11.2 09/21/2023: Hemoglobin 11.1 (Retacrit  held) 10/11/2023: Hemoglobin 10.7   IV iron : March 2024, September 2024   Current treatment:  Retacrit  will be held today  B12 injections in 3 weeks     He will come back again in 3 weeks for lab check and transfusions or Retacrit  as needed     No orders of the defined types were placed in this encounter.  The patient has a good understanding of the overall plan. he agrees with it. he will call with any problems that may develop before the next visit here. Total time spent: 30 mins including face to face time and time spent for planning, charting and co-ordination of care   Naomi MARLA Chad, MD 10/11/23

## 2023-10-11 NOTE — Assessment & Plan Note (Signed)
 Hemoglobin electrophoresis: Beta thalassemia minor.    Hospitalization 09/22/2019-09/25/2019: A. fib with RVR status post cardioversion, currently on Eliquis  Hospitalization 02/28/2022-03/11/2022: Atrial fibrillation: Cardioversion planned for 03/22/2022. 05/12/22: Ablation and Pacemaker Implantation  06/12/2023: EGD with Botox    Lab review: 09/25/2019: WBC 7.2, hemoglobin 9.6, platelets 145 04/14/22: Hb 7.8, Platelets 91 (PRBC) 06/14/22: Hb 10.4, Pl 151, Cr 1.66, Iron  sat: 16%, Ferritin 287 07/05/22: Hemoglobin 9.7 08/14/22: Hemoglobin 7.4 09/26/2022: Hemoglobin 9.1 (Retacrit  injection today) 04/12/2023: Hemoglobin 10.4 (Retacrit  and starting B12 injections) 05/24/2023: Hemoglobin 9.9 (Retacrit , B12 and IV iron ) 07/05/2023: Hemoglobin 10.8 08/08/2023: Hemoglobin 11.2 09/21/2023: Hemoglobin 11.1 (Retacrit  held)   IV iron : March 2024, September 2024   Current treatment:  Retacrit  will be held today  B12 injections in 3 weeks     He will come back again in 3 weeks for lab check and transfusions or Retacrit  as needed

## 2023-10-21 ENCOUNTER — Other Ambulatory Visit: Payer: Self-pay | Admitting: Internal Medicine

## 2023-10-21 DIAGNOSIS — I480 Paroxysmal atrial fibrillation: Secondary | ICD-10-CM

## 2023-10-22 NOTE — Telephone Encounter (Signed)
Prescription refill request for Eliquis received. Indication:afib Last office visit:11/24 Scr:1.72  4/24 Age: 89 Weight:52  kg  Prescription refilled

## 2023-10-23 DIAGNOSIS — J441 Chronic obstructive pulmonary disease with (acute) exacerbation: Secondary | ICD-10-CM | POA: Diagnosis not present

## 2023-10-23 NOTE — Progress Notes (Signed)
 Remote pacemaker transmission.

## 2023-10-25 ENCOUNTER — Telehealth: Payer: Self-pay | Admitting: Hematology and Oncology

## 2023-10-25 NOTE — Telephone Encounter (Signed)
Scheduled appointments per 2/6 los. Talked with the patients daughter Mrs.Pegg and she is aware of all made appointments for the patient Joshua Hudson.

## 2023-11-02 ENCOUNTER — Inpatient Hospital Stay: Payer: Medicare PPO

## 2023-11-02 VITALS — BP 139/83 | HR 82 | Temp 98.7°F | Resp 16

## 2023-11-02 DIAGNOSIS — D638 Anemia in other chronic diseases classified elsewhere: Secondary | ICD-10-CM | POA: Diagnosis not present

## 2023-11-02 DIAGNOSIS — D598 Other acquired hemolytic anemias: Secondary | ICD-10-CM

## 2023-11-02 DIAGNOSIS — E538 Deficiency of other specified B group vitamins: Secondary | ICD-10-CM | POA: Diagnosis not present

## 2023-11-02 DIAGNOSIS — D563 Thalassemia minor: Secondary | ICD-10-CM | POA: Diagnosis not present

## 2023-11-02 DIAGNOSIS — I4891 Unspecified atrial fibrillation: Secondary | ICD-10-CM | POA: Diagnosis not present

## 2023-11-02 DIAGNOSIS — Z7901 Long term (current) use of anticoagulants: Secondary | ICD-10-CM | POA: Diagnosis not present

## 2023-11-02 LAB — IRON AND IRON BINDING CAPACITY (CC-WL,HP ONLY)
Iron: 108 ug/dL (ref 45–182)
Saturation Ratios: 37 % (ref 17.9–39.5)
TIBC: 294 ug/dL (ref 250–450)
UIBC: 186 ug/dL (ref 117–376)

## 2023-11-02 LAB — CBC WITH DIFFERENTIAL (CANCER CENTER ONLY)
Abs Immature Granulocytes: 0.43 10*3/uL — ABNORMAL HIGH (ref 0.00–0.07)
Basophils Absolute: 0.1 10*3/uL (ref 0.0–0.1)
Basophils Relative: 1 %
Eosinophils Absolute: 0.1 10*3/uL (ref 0.0–0.5)
Eosinophils Relative: 1 %
HCT: 36.5 % — ABNORMAL LOW (ref 39.0–52.0)
Hemoglobin: 11.2 g/dL — ABNORMAL LOW (ref 13.0–17.0)
Immature Granulocytes: 4 %
Lymphocytes Relative: 7 %
Lymphs Abs: 0.7 10*3/uL (ref 0.7–4.0)
MCH: 20.4 pg — ABNORMAL LOW (ref 26.0–34.0)
MCHC: 30.7 g/dL (ref 30.0–36.0)
MCV: 66.5 fL — ABNORMAL LOW (ref 80.0–100.0)
Monocytes Absolute: 0.8 10*3/uL (ref 0.1–1.0)
Monocytes Relative: 7 %
Neutro Abs: 8.4 10*3/uL — ABNORMAL HIGH (ref 1.7–7.7)
Neutrophils Relative %: 80 %
Platelet Count: 142 10*3/uL — ABNORMAL LOW (ref 150–400)
RBC: 5.49 MIL/uL (ref 4.22–5.81)
RDW: 16.3 % — ABNORMAL HIGH (ref 11.5–15.5)
WBC Count: 10.6 10*3/uL — ABNORMAL HIGH (ref 4.0–10.5)
nRBC: 0.2 % (ref 0.0–0.2)

## 2023-11-02 LAB — FERRITIN: Ferritin: 53 ng/mL (ref 24–336)

## 2023-11-02 LAB — SAMPLE TO BLOOD BANK

## 2023-11-02 LAB — VITAMIN B12: Vitamin B-12: 653 pg/mL (ref 180–914)

## 2023-11-02 MED ORDER — EPOETIN ALFA-EPBX 40000 UNIT/ML IJ SOLN: 40000.0000 [IU] | Freq: Once | INTRAMUSCULAR | Status: DC

## 2023-11-02 MED ORDER — CYANOCOBALAMIN 1000 MCG/ML IJ SOLN
1000.0000 ug | Freq: Once | INTRAMUSCULAR | Status: AC
  Administered 2023-11-02: 1000 ug via INTRAMUSCULAR
  Filled 2023-11-02: qty 1

## 2023-11-14 DIAGNOSIS — Z6820 Body mass index (BMI) 20.0-20.9, adult: Secondary | ICD-10-CM | POA: Diagnosis not present

## 2023-11-14 DIAGNOSIS — M0579 Rheumatoid arthritis with rheumatoid factor of multiple sites without organ or systems involvement: Secondary | ICD-10-CM | POA: Diagnosis not present

## 2023-11-14 DIAGNOSIS — D638 Anemia in other chronic diseases classified elsewhere: Secondary | ICD-10-CM | POA: Diagnosis not present

## 2023-11-14 DIAGNOSIS — Z79899 Other long term (current) drug therapy: Secondary | ICD-10-CM | POA: Diagnosis not present

## 2023-11-14 DIAGNOSIS — M1991 Primary osteoarthritis, unspecified site: Secondary | ICD-10-CM | POA: Diagnosis not present

## 2023-11-16 ENCOUNTER — Inpatient Hospital Stay: Payer: Medicare PPO

## 2023-11-16 ENCOUNTER — Inpatient Hospital Stay: Payer: Medicare PPO | Attending: Hematology and Oncology

## 2023-11-16 ENCOUNTER — Encounter: Payer: Self-pay | Admitting: Adult Health

## 2023-11-16 ENCOUNTER — Inpatient Hospital Stay (HOSPITAL_BASED_OUTPATIENT_CLINIC_OR_DEPARTMENT_OTHER): Payer: Medicare PPO | Admitting: Adult Health

## 2023-11-16 VITALS — BP 146/76 | HR 85 | Temp 97.9°F | Resp 16 | Ht 63.0 in | Wt 116.1 lb

## 2023-11-16 DIAGNOSIS — D638 Anemia in other chronic diseases classified elsewhere: Secondary | ICD-10-CM

## 2023-11-16 DIAGNOSIS — D598 Other acquired hemolytic anemias: Secondary | ICD-10-CM

## 2023-11-16 DIAGNOSIS — E039 Hypothyroidism, unspecified: Secondary | ICD-10-CM | POA: Insufficient documentation

## 2023-11-16 DIAGNOSIS — D563 Thalassemia minor: Secondary | ICD-10-CM

## 2023-11-16 DIAGNOSIS — N1832 Chronic kidney disease, stage 3b: Secondary | ICD-10-CM | POA: Diagnosis not present

## 2023-11-16 DIAGNOSIS — I480 Paroxysmal atrial fibrillation: Secondary | ICD-10-CM | POA: Diagnosis not present

## 2023-11-16 DIAGNOSIS — Z7901 Long term (current) use of anticoagulants: Secondary | ICD-10-CM | POA: Insufficient documentation

## 2023-11-16 DIAGNOSIS — E538 Deficiency of other specified B group vitamins: Secondary | ICD-10-CM | POA: Diagnosis not present

## 2023-11-16 LAB — CBC WITH DIFFERENTIAL (CANCER CENTER ONLY)
Abs Immature Granulocytes: 0.07 10*3/uL (ref 0.00–0.07)
Basophils Absolute: 0 10*3/uL (ref 0.0–0.1)
Basophils Relative: 0 %
Eosinophils Absolute: 0.1 10*3/uL (ref 0.0–0.5)
Eosinophils Relative: 1 %
HCT: 33.6 % — ABNORMAL LOW (ref 39.0–52.0)
Hemoglobin: 10.2 g/dL — ABNORMAL LOW (ref 13.0–17.0)
Immature Granulocytes: 1 %
Lymphocytes Relative: 6 %
Lymphs Abs: 0.6 10*3/uL — ABNORMAL LOW (ref 0.7–4.0)
MCH: 20.9 pg — ABNORMAL LOW (ref 26.0–34.0)
MCHC: 30.4 g/dL (ref 30.0–36.0)
MCV: 68.7 fL — ABNORMAL LOW (ref 80.0–100.0)
Monocytes Absolute: 0.4 10*3/uL (ref 0.1–1.0)
Monocytes Relative: 4 %
Neutro Abs: 8.9 10*3/uL — ABNORMAL HIGH (ref 1.7–7.7)
Neutrophils Relative %: 88 %
Platelet Count: 132 10*3/uL — ABNORMAL LOW (ref 150–400)
RBC: 4.89 MIL/uL (ref 4.22–5.81)
RDW: 17.7 % — ABNORMAL HIGH (ref 11.5–15.5)
WBC Count: 10.1 10*3/uL (ref 4.0–10.5)
nRBC: 0.2 % (ref 0.0–0.2)

## 2023-11-16 LAB — IRON AND IRON BINDING CAPACITY (CC-WL,HP ONLY)
Iron: 51 ug/dL (ref 45–182)
Saturation Ratios: 16 % — ABNORMAL LOW (ref 17.9–39.5)
TIBC: 328 ug/dL (ref 250–450)
UIBC: 277 ug/dL (ref 117–376)

## 2023-11-16 LAB — FERRITIN: Ferritin: 21 ng/mL — ABNORMAL LOW (ref 24–336)

## 2023-11-16 LAB — VITAMIN B12: Vitamin B-12: 568 pg/mL (ref 180–914)

## 2023-11-16 MED ORDER — CYANOCOBALAMIN 1000 MCG/ML IJ SOLN
1000.0000 ug | Freq: Once | INTRAMUSCULAR | Status: AC
  Administered 2023-11-16: 1000 ug via INTRAMUSCULAR
  Filled 2023-11-16: qty 1

## 2023-11-16 MED ORDER — EPOETIN ALFA-EPBX 40000 UNIT/ML IJ SOLN
40000.0000 [IU] | Freq: Once | INTRAMUSCULAR | Status: AC
  Administered 2023-11-16: 40000 [IU] via SUBCUTANEOUS
  Filled 2023-11-16: qty 1

## 2023-11-16 NOTE — Progress Notes (Signed)
 [facility] Cancer Follow up:    Joshua Screws, MD [address] [place] [address]   DIAGNOSIS: anemia of chronic disease  SUMMARY OF HEMATOLOGIC HISTORY:  Anemia of chronic disease Hemoglobin electrophoresis: Beta thalassemia minor.    Hospitalization 09/22/2019-09/25/2019: A. fib with RVR status post cardioversion, currently on Eliquis Hospitalization 02/28/2022-03/11/2022: Atrial fibrillation: Cardioversion planned for 03/22/2022. 05/12/22: Ablation and Pacemaker Implantation  06/12/2023: EGD with Botox  09/25/2019: WBC 7.2, hemoglobin 9.6, platelets 145 04/14/22: Hb 7.8, Platelets 91 (PRBC) 06/14/22: Hb 10.4, Pl 151, Cr 1.66, Iron sat: 16%, Ferritin 287 07/05/22: Hemoglobin 9.7 08/14/22: Hemoglobin 7.4 09/26/2022: Hemoglobin 9.1 (Retacrit injection today) 04/12/2023: Hemoglobin 10.4 (Retacrit and starting B12 injections) 05/24/2023: Hemoglobin 9.9 (Retacrit, B12 and IV iron) 07/05/2023: Hemoglobin 10.8 08/08/2023: Hemoglobin 11.2 09/21/2023: Hemoglobin 11.1 (Retacrit held) 10/11/2023: Hemoglobin 10.7   IV iron: March 2024, September 2024  CURRENT THERAPY: Retacrit, intermittent iron, b12  INTERVAL HISTORY: Joshua Hudson 89 y.o. male with a history of anemia, presents with persistent fatigue. He reports a lack of energy and feeling 'completely given out' most of the time. He has been receiving B12 and Retacrit injections as needed, with the last visit not requiring Retacrit. Despite these treatments, his energy levels have not significantly improved. He also has a significant cardiac history and is under the care of a cardiologist. He enjoys sitting outside in the sun daily but reports that he is unable to do housework or yard work due to his fatigue.   Patient Active Problem List   Diagnosis Date Noted   Pacemaker 11/16/2022   GI bleed 02/28/2022   Hypokalemia 02/24/2022   Fatigue 02/24/2022   Physical deconditioning 02/23/2022   GIB (gastrointestinal  bleeding) 02/22/2022   Stage 3b chronic kidney disease (CKD) (HCC) 02/22/2022   Hypothyroidism 02/22/2022   Anemia of chronic disease 07/26/2020   Monocular vision loss 03/23/2020   Atrial flutter (HCC) 09/22/2019   Acute systolic (congestive) heart failure (HCC) 09/22/2019   Elevated troponin 09/22/2019   Microcytic anemia 09/22/2019   S/P shoulder replacement, right 07/18/2019   Thrombocytopenia (HCC) 05/20/2019   Other pancytopenia (HCC) 05/06/2019   Protein-calorie malnutrition, severe 04/30/2019   FTT (failure to thrive) in adult 04/28/2019   GERD (gastroesophageal reflux disease)    Esophageal stricture    History of esophageal stricture    Odynophagia    Hypertension    Rheumatoid arthritis (HCC)    S/P valve-in-valve TAVR    Prosthetic valve dysfunction    Severe aortic regurgitation 04/02/2019   Lower extremity edema 03/24/2019   Mitral regurgitation 05/16/2016   Paroxysmal atrial fibrillation (HCC) 05/10/2015   Hemolytic anemia (HCC) 02/25/2014   Chronic combined CHF 02/25/2014   Dysphagia 02/25/2014   S/P aortic valve replacement with bioprosthetic valve 01/16/2008    has no known allergies.  MEDICAL HISTORY: Past Medical History:  Diagnosis Date   Achalasia 06/12/2019   Noted on EGD   Aortic insufficiency 03/24/2019   AI of bioprosthetic AVR severe 4 plus   Atrial flutter (HCC)    Chronic diastolic CHF (congestive heart failure) (HCC) 03/24/2019   Chronic kidney disease, stage 3b (HCC)    Colon polyps    s/p diverticular perforation requiring 2-stage repair   Derangement of right shoulder joint    need replacing has no use of   Diverticulosis    DJD (degenerative joint disease), lumbosacral    Dysphagia    eats soft food   Esophageal stricture  GERD (gastroesophageal reflux disease)    GIB (gastrointestinal bleeding)    Hemolytic anemia (HCC)    History of blood transfusion given with 04-15-19 surgery   History of blood transfusion 07/18/2019    History of kidney stones    passed stones   Hypertension    Mitral regurgitation    Myxomatous mitral valve with mild to moderate MR on echo 07/2023   Occipital neuralgia    Pacemaker    AVN ablation + MDT PPM 2023   Pancytopenia (HCC)    Postoperative atrial fibrillation (HCC) 05/10/2015   Prosthetic valve dysfunction    Rheumatoid arthritis (HCC)    s/o long term steroids  shoulders and hands   Rotator cuff arthropathy    right   S/P aortic valve replacement with bioprosthetic valve 01/16/2008   25mm Edwards Magna perimount bovine pericardial tissue valve, model 3000   S/P valve-in-valve TAVR 04/15/2019   26 mm Edwards Sapien 3 Ultra transcatheter heart valve placed via percutaneous right transfemoral approach    SBE (subacute bacterial endocarditis) prophylaxis candidate    for dental procedures   Schatzki's ring 06/12/2019   Narrowing, Noted on EGD   Severe aortic stenosis    S/P prosthetic valve replacement w 25 mm Edwards like science percardial tissue valve,Turner - 01/2008   Thrombocytopenia (HCC)     SURGICAL HISTORY: Past Surgical History:  Procedure Laterality Date   A-FLUTTER ABLATION N/A 10/10/2019   Procedure: A-FLUTTER ABLATION;  Surgeon: Marinus Maw, MD;  [facility];  Service: Cardiovascular;  Laterality: N/A;   APPENDECTOMY     AV NODE ABLATION N/A 05/12/2022   Procedure: AV NODE ABLATION;  Surgeon: Marinus Maw, MD;  [facility]: [facility];  Service: Cardiovascular;  Laterality: N/A;   BALLOON DILATION N/A 06/12/2019   Procedure: BALLOON DILATION;  Surgeon: Kerin Salen, MD;  Location: [facility]OS[facility];  Service: Gastroenterology;  Laterality: N/A;   BIOPSY  06/12/2023   Procedure: BIOPSY;  Surgeon: Kerin Salen, MD;  Location: [facility] ENDOSCOPY;  Service: Gastroenterology;;   BOTOX INJECTION N/A 01/22/2018   Procedure: BOTOX INJECTION;  Surgeon: Kerin Salen, MD;  Location: [facility];  Service: Gastroenterology;  Laterality: N/A;   BOTOX  INJECTION N/A 06/12/2023   Procedure: BOTOX INJECTION;  Surgeon: Kerin Salen, MD;  Location: [facility];  Service: Gastroenterology;  Laterality: N/A;   CARDIAC CATHETERIZATION     09   CARDIAC VALVE REPLACEMENT  01/2008   aortic valve replacement   CARDIOVERSION N/A 09/25/2019   Procedure: CARDIOVERSION;  Surgeon: Jake Bathe, MD;  Location: [facility] ENDOSCOPY;  Service: Cardiovascular;  Laterality: N/A;   CARDIOVERSION N/A 12/25/2019   Procedure: CARDIOVERSION;  Surgeon: Lewayne Bunting, MD;  Location: [facility] ENDOSCOPY;  Service: Cardiovascular;  Laterality: N/A;   CARDIOVERSION N/A 03/08/2022   Procedure: CARDIOVERSION;  Surgeon: Meriam Sprague, MD;  Location: [facility] ENDOSCOPY;  Service: Cardiovascular;  Laterality: N/A;   CARDIOVERSION N/A 03/28/2022   Procedure: CARDIOVERSION;  Surgeon: Thomasene Ripple, DO;  Location: [facility]OS[facility];  Service: Cardiovascular;  Laterality: N/A;   CATARACT EXTRACTION W/ INTRAOCULAR LENS  IMPLANT, BILATERAL     COLON RESECTION     diverticulitis    COLONOSCOPY N/A 03/03/2022   Procedure: COLONOSCOPY;  Surgeon: Kathi Der, MD;  Location: [facility];  Service: Gastroenterology;  Laterality: N/A;   CORONARY ANGIOPLASTY     dental implants     permanent   ENTEROSCOPY N/A 03/03/2022   Procedure: ENTEROSCOPY;  Surgeon: Kathi Der, MD;  [facility]: [facility];  Service: Gastroenterology;  Laterality: N/A;   ESOPHAGEAL MANOMETRY N/A 11/07/2017   Procedure: ESOPHAGEAL MANOMETRY (EM);  Surgeon: Kerin Salen, MD;  [facility]: [facility];  Service: Gastroenterology;  Laterality: N/A;   ESOPHAGOGASTRODUODENOSCOPY N/A 02/24/2022   Procedure: ESOPHAGOGASTRODUODENOSCOPY (EGD);  Surgeon: Willis Modena, MD;  Location: [facility] ENDOSCOPY;  Service: Gastroenterology;  Laterality: N/A;   ESOPHAGOGASTRODUODENOSCOPY (EGD) WITH PROPOFOL N/A 01/22/2018   Procedure: ESOPHAGOGASTRODUODENOSCOPY (EGD) WITH PROPOFOL;  Surgeon: Kerin Salen, MD;  [facility];  Service:  Gastroenterology;  Laterality: N/A;   ESOPHAGOGASTRODUODENOSCOPY (EGD) WITH PROPOFOL N/A 04/30/2019   Procedure: ESOPHAGOGASTRODUODENOSCOPY (EGD) WITH PROPOFOL;  Surgeon: Carman Ching, MD;  Location: [facility] ENDOSCOPY;  Service: Endoscopy;  Laterality: N/A;   ESOPHAGOGASTRODUODENOSCOPY (EGD) WITH PROPOFOL N/A 06/12/2019   Procedure: ESOPHAGOGASTRODUODENOSCOPY (EGD) WITH PROPOFOL;  Surgeon: Kerin Salen, MD;  [facility];  Service: Gastroenterology;  Laterality: N/A;  with botox injection   ESOPHAGOGASTRODUODENOSCOPY (EGD) WITH PROPOFOL N/A 06/12/2023   Procedure: ESOPHAGOGASTRODUODENOSCOPY (EGD) WITH PROPOFOL;  Surgeon: Kerin Salen, MD;  Location: [facility];  Service: Gastroenterology;  Laterality: N/A;  with dilation and botox injection   EXCISIONAL TOTAL SHOULDER ARTHROPLASTY WITH ANTIBIOTIC SPACER Right 12/31/2018   Procedure: EXCISIONAL TOTAL SHOULDER ARTHROPLASTY WITH ANTIBIOTIC SPACER;  Surgeon: Beverely Low, MD;  Location[facility] OR;  Service: Orthopedics;  Laterality: Right;   GIVENS CAPSULE STUDY N/A 03/01/2022   Procedure: GIVENS CAPSULE STUDY;  Surgeon: Kathi Der, MD;  Location: [facility];  Service: Gastroenterology;  Laterality: N/A;   HEMOSTASIS CLIP PLACEMENT  03/03/2022   Procedure: HEMOSTASIS CLIP PLACEMENT;  Surgeon: Kathi Der, MD;  [facility]: [facility];  Service: Gastroenterology;;   HERNIA REPAIR     HOT HEMOSTASIS N/A 03/03/2022   Procedure: HOT HEMOSTASIS (ARGON PLASMA COAGULATION/BICAP);  Surgeon: Kathi Der, MD;  Location: [facility] ENDOSCOPY;  Service: Gastroenterology;  Laterality: N/A;  EGD and COLON   INTRAOPERATIVE TRANSTHORACIC ECHOCARDIOGRAM N/A 04/15/2019   Procedure: Intraoperative Transthoracic Echocardiogram;  Surgeon: Tonny Bollman, MD;  Location: [facility] OR;  Service: Open Heart Surgery;  Laterality: N/A;   IRRIGATION AND DEBRIDEMENT SHOULDER Right 11/20/2018    IRRIGATION AND DEBRIDEMENT SHOULDER WITH POLY EXCHANGE (Right Shoulder)    IRRIGATION AND DEBRIDEMENT SHOULDER Right 11/20/2018   Procedure: IRRIGATION AND DEBRIDEMENT SHOULDER WITH POLY EXCHANGE;  Surgeon: Beverely Low, MD;  Location: [facility] OR;  Service: Orthopedics;  Laterality: Right;   LUMBAR LAMINECTOMY     x 2   PACEMAKER IMPLANT N/A 05/12/2022   Procedure: PACEMAKER IMPLANT;  Surgeon: Marinus Maw, MD;  [facility];  Service: Cardiovascular;  Laterality: N/A;   POLYPECTOMY  03/03/2022   Procedure: POLYPECTOMY;  Surgeon: Kathi Der, MD;  Location: [facility];  Service: Gastroenterology;;   REVERSE SHOULDER ARTHROPLASTY Right 03/01/2018   Procedure: RIGHT REVERSE SHOULDER ARTHROPLASTY;  Surgeon: Beverely Low, MD;  Location[facility] OR;  Service: Orthopedics;  Laterality: Right;   REVERSE SHOULDER ARTHROPLASTY Right 07/18/2019   Procedure: REVERSE TOTAL SHOULDER ARTHROPLASTY and removal of antiobotic spacer;  Surgeon: Beverely Low, MD;  Location: [facility] ORS;  Service: Orthopedics;  Laterality: Right;  interscalene block   REVERSE SHOULDER ARTHROPLASTY Right 08/06/2019   Procedure: Reduction of dislocated reverse total shoulder and poly exchange;  Surgeon: Beverely Low, MD;  Location: [facility] ORS;  Service: Orthopedics;  Laterality: Right;  need 1 hour   RIGHT/LEFT HEART CATH AND CORONARY ANGIOGRAPHY N/A 04/02/2019   Procedure: RIGHT/LEFT HEART CATH AND CORONARY ANGIOGRAPHY;  Surgeon: Marykay Lex, MD;  Location: [facility] INVASIVE CV LAB;  Service: Cardiovascular;  Laterality: N/A;   SAVORY DILATION N/A 01/22/2018   Procedure: SAVORY DILATION;  Surgeon: Kerin Salen, MD;  Location[facility];  Service: Gastroenterology;  Laterality: N/A;   SAVORY DILATION N/A 04/30/2019   Procedure: SAVORY DILATION;  Surgeon: Carman Ching, MD;  Location: [facility] ENDOSCOPY;  Service: Endoscopy;  Laterality: N/A;  With fluro   SHOULDER HEMI-ARTHROPLASTY Right 06/14/2018   Procedure: RIGHT  REVERSE TOTAL SHOULDER OPEN POLY EXCHANGE;  Surgeon: Beverely Low, MD;  Location: [facility] OR;   Service: Orthopedics;  Laterality: Right;   SUBMUCOSAL INJECTION  06/12/2019   Procedure: SUBMUCOSAL INJECTION;  Surgeon: Kerin Salen, MD;  Location[facility];  Service: Gastroenterology;;   TEE WITHOUT CARDIOVERSION N/A 01/29/2014   Procedure: TRANSESOPHAGEAL ECHOCARDIOGRAM (TEE);  Surgeon: Quintella Reichert, MD;  Location: [facility] ENDOSCOPY;  Service: Cardiovascular;  Laterality: N/A;   TEE WITHOUT CARDIOVERSION N/A 04/02/2019   Procedure: TRANSESOPHAGEAL ECHOCARDIOGRAM (TEE);  Surgeon: Wendall Stade, MD;  Location: [facility] ENDOSCOPY;  Service: Cardiovascular;  Laterality: N/A;   TEE WITHOUT CARDIOVERSION N/A 09/25/2019   Procedure: TRANSESOPHAGEAL ECHOCARDIOGRAM (TEE);  Surgeon: Jake Bathe, MD;  Location: [facility] ENDOSCOPY;  Service: Cardiovascular;  Laterality: N/A;   TEE WITHOUT CARDIOVERSION N/A 03/08/2022   Procedure: TRANSESOPHAGEAL ECHOCARDIOGRAM (TEE);  Surgeon: Meriam Sprague, MD;  Location: [facility] ENDOSCOPY;  Service: Cardiovascular;  Laterality: N/A;   TONSILLECTOMY     TRANSCATHETER AORTIC VALVE REPLACEMENT, TRANSFEMORAL  04/15/2019   TRANSCATHETER AORTIC VALVE REPLACEMENT, TRANSFEMORAL N/A 04/15/2019   Procedure: TRANSCATHETER AORTIC VALVE REPLACEMENT, TRANSFEMORAL with POST BALLOON DILATION;  Surgeon: Tonny Bollman, MD;  Location: [facility] OR;  Service: Open Heart Surgery;  Laterality: N/A;    SOCIAL HISTORY: Social History   Socioeconomic History   Marital status: Widowed    Spouse name: Not on file   Number of children: 2   Years of education: Not on file   Highest education level: Not on file  Occupational History   Occupation: Retired Hospital doctor at [employer]  Tobacco Use   Smoking status: Former    Current packs/day: 0.00    Average packs/day: 0.5 packs/day for 10.0 years (5.0 ttl pk-yrs)    Types: Cigarettes    Start date: 01/16/1974    Quit date: 09/05/1983    Years since quitting: 40.2   Smokeless tobacco: Former    Types: Associate Professor status: Never Used  Substance and  Sexual Activity   Alcohol use: Not Currently    Alcohol/week: 0.0 standard drinks of alcohol   Drug use: No   Sexual activity: Not on file  Other Topics Concern   Not on file  Social History Narrative   Not on file   Social Drivers of Health   Financial Resource Strain: Low Risk  (06/30/2022)   Overall Financial Resource Strain (CARDIA)    Difficulty of Paying Living Expenses: Not hard at all  Food Insecurity: No Food Insecurity (06/30/2022)   Hunger Vital Sign    Worried About Running Out of Food in the Last Year: Never true    Ran Out of Food in the Last Year: Never true  Transportation Needs: No Transportation Needs (06/30/2022)   PRAPARE - Administrator, Civil Service (Medical): No    Lack of Transportation (Non-Medical): No  Physical Activity: Not on file  Stress: Not on file  Social Connections: Not on file  Intimate Partner Violence: Not on file    FAMILY HISTORY: Family History  Problem Relation Age of Onset  Other Mother        NO HEALTH PROBLEMS   Heart disease Father    Arthritis Father    Heart Problems Sister        RELATED TO A MVA   Suicidality Brother    Other Sister        3 SISTERS IN GOOD HEALTH   Other Brother        2 BROTHERS IN GOOD HEALTH   Other Daughter        2 IN GOOD HEALTH    Review of Systems  Constitutional:  Positive for fatigue. Negative for appetite change, chills, fever and unexpected weight change.  HENT:   Negative for hearing loss, lump/mass and trouble swallowing.   Eyes:  Negative for eye problems and icterus.  Respiratory:  Negative for chest tightness, cough and shortness of breath.   Cardiovascular:  Negative for chest pain, leg swelling and palpitations.  Gastrointestinal:  Negative for abdominal distention, abdominal pain, constipation, diarrhea, nausea and vomiting.  Endocrine: Negative for hot flashes.  Genitourinary:  Negative for difficulty urinating.   Musculoskeletal:  Negative for arthralgias.   Skin:  Negative for itching and rash.  Neurological:  Negative for dizziness, extremity weakness, headaches and numbness.  Hematological:  Negative for adenopathy. Does not bruise/bleed easily.  Psychiatric/Behavioral:  Negative for depression. The patient is not nervous/anxious.       PHYSICAL EXAMINATION    Vitals:   11/16/23 1003  BP: (!) 146/76  Pulse: 85  Resp: 16  Temp: 97.9 F (36.6 C)  SpO2: 100%    Physical Exam Constitutional:      General: He is not in acute distress.    Appearance: Normal appearance. He is not toxic-appearing.  HENT:     Head: Normocephalic and atraumatic.     Mouth/Throat:     Mouth: Mucous membranes are moist.     Pharynx: Oropharynx is clear. No oropharyngeal exudate or posterior oropharyngeal erythema.  Eyes:     General: No scleral icterus. Cardiovascular:     Rate and Rhythm: Normal rate and regular rhythm.     Pulses: Normal pulses.     Heart sounds: Normal heart sounds.  Pulmonary:     Effort: Pulmonary effort is normal.     Breath sounds: Normal breath sounds.  Abdominal:     General: Abdomen is flat. Bowel sounds are normal. There is no distension.     Palpations: Abdomen is soft.     Tenderness: There is no abdominal tenderness.  Musculoskeletal:        General: No swelling.     Cervical back: Neck supple.  Lymphadenopathy:     Cervical: No cervical adenopathy.  Skin:    General: Skin is warm and dry.     Findings: No rash.  Neurological:     General: No focal deficit present.     Mental Status: He is alert.  Psychiatric:        Mood and Affect: Mood normal.        Behavior: Behavior normal.     LABORATORY DATA:  CBC    Component Value Date/Time   WBC 10.1 11/16/2023 0916   WBC 11.4 (H) 08/21/2022 1835   RBC 4.89 11/16/2023 0916   HGB 10.2 (L) 11/16/2023 0916   HGB 9.0 (L) 12/19/2019 0836   HGB 11.7 (L) 09/18/2014 1100   HCT 33.6 (L) 11/16/2023 0916   HCT 27.8 (L) 12/19/2019 0836   HCT 34.8 (L)  09/18/2014 1100  PLT 132 (L) 11/16/2023 0916   PLT 210 12/19/2019 0836   MCV 68.7 (L) 11/16/2023 0916   MCV 67 (L) 12/19/2019 0836   MCV 69 (L) 09/18/2014 1100   MCH 20.9 (L) 11/16/2023 0916   MCHC 30.4 11/16/2023 0916   RDW 17.7 (H) 11/16/2023 0916   RDW 16.8 (H) 12/19/2019 0836   RDW 16.9 (H) 09/18/2014 1100   LYMPHSABS 0.6 (L) 11/16/2023 0916   LYMPHSABS 0.4 (L) 04/23/2019 1432   LYMPHSABS 0.6 (L) 09/18/2014 1100   MONOABS 0.4 11/16/2023 0916   EOSABS 0.1 11/16/2023 0916   EOSABS 0.5 (H) 04/23/2019 1432   EOSABS 0.0 09/18/2014 1100   BASOSABS 0.0 11/16/2023 0916   BASOSABS 0.0 04/23/2019 1432   BASOSABS 0.0 09/18/2014 1100    CMP     Component Value Date/Time   NA 142 12/27/2022 0737   NA 142 03/23/2020 0951   K 3.5 12/27/2022 0737   CL 103 12/27/2022 0737   CO2 30 12/27/2022 0737   GLUCOSE 97 12/27/2022 0737   BUN 37 (H) 12/27/2022 0737   BUN 22 03/23/2020 0951   CREATININE 1.72 (H) 12/27/2022 0737   CALCIUM 9.5 12/27/2022 0737   PROT 6.4 (L) 12/27/2022 0737   PROT 6.4 02/09/2020 1353   ALBUMIN 4.1 12/27/2022 0737   ALBUMIN 4.4 02/09/2020 1353   AST 16 12/27/2022 0737   ALT 8 12/27/2022 0737   ALKPHOS 59 12/27/2022 0737   BILITOT 0.4 12/27/2022 0737   GFRNONAA 38 (L) 12/27/2022 0737   GFRAA 41 (L) 03/31/2020 1057       ASSESSMENT and THERAPY PLAN:   Anemia of chronic disease Hemoglobin electrophoresis: Beta thalassemia minor.    Hospitalization 09/22/2019-09/25/2019: A. fib with RVR status post cardioversion, currently on Eliquis Hospitalization 02/28/2022-03/11/2022: Atrial fibrillation: Cardioversion planned for 03/22/2022. 05/12/22: Ablation and Pacemaker Implantation  06/12/2023: EGD with Botox     IV iron: March 2024, September 2024   Current treatment:  Retacrit for hemoglobin <11  B12 injections  IV iron decision will be made once labs result   Anemia Chronic anemia with hemoglobin at 10.2 g/dL. Fatigue likely multifactorial, including anemia,  cardiac condition, and age. B12 levels adequate with supplementation. Retacrit and B12 aim to optimize hemoglobin and energy. - Administer Retacrit injection due to hemoglobin level of 10.2 g/dL. - Continue B12 injections to maintain adequate levels. - Evaluate need for IV iron based on lab results to be received later today.   RTC every 3 weeks for labs and injection, to see Dr. Pamelia Hoit on 12/27/2023   All questions were answered. The patient knows to call the clinic with any problems, questions or concerns. We can certainly see the patient much sooner if necessary.  Total encounter time:20 minutes*in face-to-face visit time, chart review, lab review, care coordination, order entry, and documentation of the encounter time.    Lillard Anes, NP 11/19/23 8:14 AM Medical Oncology and Hematology [facility] [address] [place], [place] [address] Tel. [phone]    Fax. [phone]  *Total Encounter Time as defined by the Centers for Medicare and Medicaid Services includes, in addition to the face-to-face time of a patient visit (documented in the note above) non-face-to-face time: obtaining and reviewing outside history, ordering and reviewing medications, tests or procedures, care coordination (communications with other health care professionals or caregivers) and documentation in the medical record.

## 2023-11-19 ENCOUNTER — Encounter: Payer: Self-pay | Admitting: Hematology and Oncology

## 2023-11-19 NOTE — Assessment & Plan Note (Signed)
 Hemoglobin electrophoresis: Beta thalassemia minor.    Hospitalization 09/22/2019-09/25/2019: A. fib with RVR status post cardioversion, currently on Eliquis Hospitalization 02/28/2022-03/11/2022: Atrial fibrillation: Cardioversion planned for 03/22/2022. 05/12/22: Ablation and Pacemaker Implantation  06/12/2023: EGD with Botox     IV iron: March 2024, September 2024   Current treatment:  Retacrit for hemoglobin <11  B12 injections  IV iron decision will be made once labs result   Anemia Chronic anemia with hemoglobin at 10.2 g/dL. Fatigue likely multifactorial, including anemia, cardiac condition, and age. B12 levels adequate with supplementation. Retacrit and B12 aim to optimize hemoglobin and energy. - Administer Retacrit injection due to hemoglobin level of 10.2 g/dL. - Continue B12 injections to maintain adequate levels. - Evaluate need for IV iron based on lab results to be received later today.   RTC every 3 weeks for labs and injection, to see Dr. Pamelia Hoit on 12/27/2023

## 2023-12-04 DIAGNOSIS — H353211 Exudative age-related macular degeneration, right eye, with active choroidal neovascularization: Secondary | ICD-10-CM | POA: Diagnosis not present

## 2023-12-06 ENCOUNTER — Other Ambulatory Visit: Payer: Self-pay | Admitting: *Deleted

## 2023-12-06 DIAGNOSIS — D638 Anemia in other chronic diseases classified elsewhere: Secondary | ICD-10-CM

## 2023-12-07 ENCOUNTER — Inpatient Hospital Stay: Payer: Medicare PPO | Attending: Hematology and Oncology

## 2023-12-07 ENCOUNTER — Inpatient Hospital Stay: Payer: Medicare PPO

## 2023-12-07 VITALS — BP 144/85 | HR 81 | Temp 98.0°F | Resp 16

## 2023-12-07 DIAGNOSIS — E876 Hypokalemia: Secondary | ICD-10-CM | POA: Insufficient documentation

## 2023-12-07 DIAGNOSIS — D638 Anemia in other chronic diseases classified elsewhere: Secondary | ICD-10-CM | POA: Insufficient documentation

## 2023-12-07 DIAGNOSIS — E538 Deficiency of other specified B group vitamins: Secondary | ICD-10-CM | POA: Diagnosis not present

## 2023-12-07 DIAGNOSIS — D563 Thalassemia minor: Secondary | ICD-10-CM | POA: Diagnosis not present

## 2023-12-07 LAB — CBC WITH DIFFERENTIAL (CANCER CENTER ONLY)
Abs Immature Granulocytes: 0.05 10*3/uL (ref 0.00–0.07)
Basophils Absolute: 0 10*3/uL (ref 0.0–0.1)
Basophils Relative: 0 %
Eosinophils Absolute: 0.1 10*3/uL (ref 0.0–0.5)
Eosinophils Relative: 1 %
HCT: 32.9 % — ABNORMAL LOW (ref 39.0–52.0)
Hemoglobin: 9.9 g/dL — ABNORMAL LOW (ref 13.0–17.0)
Immature Granulocytes: 0 %
Lymphocytes Relative: 8 %
Lymphs Abs: 0.9 10*3/uL (ref 0.7–4.0)
MCH: 20.5 pg — ABNORMAL LOW (ref 26.0–34.0)
MCHC: 30.1 g/dL (ref 30.0–36.0)
MCV: 68.3 fL — ABNORMAL LOW (ref 80.0–100.0)
Monocytes Absolute: 0.7 10*3/uL (ref 0.1–1.0)
Monocytes Relative: 6 %
Neutro Abs: 9.7 10*3/uL — ABNORMAL HIGH (ref 1.7–7.7)
Neutrophils Relative %: 85 %
Platelet Count: 197 10*3/uL (ref 150–400)
RBC: 4.82 MIL/uL (ref 4.22–5.81)
RDW: 17.2 % — ABNORMAL HIGH (ref 11.5–15.5)
WBC Count: 11.4 10*3/uL — ABNORMAL HIGH (ref 4.0–10.5)
nRBC: 0 % (ref 0.0–0.2)

## 2023-12-07 LAB — IRON AND IRON BINDING CAPACITY (CC-WL,HP ONLY)
Iron: 42 ug/dL — ABNORMAL LOW (ref 45–182)
Saturation Ratios: 12 % — ABNORMAL LOW (ref 17.9–39.5)
TIBC: 349 ug/dL (ref 250–450)
UIBC: 307 ug/dL (ref 117–376)

## 2023-12-07 LAB — FERRITIN: Ferritin: 19 ng/mL — ABNORMAL LOW (ref 24–336)

## 2023-12-07 LAB — VITAMIN B12: Vitamin B-12: 586 pg/mL (ref 180–914)

## 2023-12-07 MED ORDER — CYANOCOBALAMIN 1000 MCG/ML IJ SOLN
1000.0000 ug | Freq: Once | INTRAMUSCULAR | Status: AC
  Administered 2023-12-07: 1000 ug via INTRAMUSCULAR
  Filled 2023-12-07: qty 1

## 2023-12-07 MED ORDER — EPOETIN ALFA-EPBX 40000 UNIT/ML IJ SOLN
40000.0000 [IU] | Freq: Once | INTRAMUSCULAR | Status: AC
  Administered 2023-12-07: 40000 [IU] via SUBCUTANEOUS
  Filled 2023-12-07: qty 1

## 2023-12-13 ENCOUNTER — Ambulatory Visit: Payer: Medicare PPO

## 2023-12-13 DIAGNOSIS — I059 Rheumatic mitral valve disease, unspecified: Secondary | ICD-10-CM | POA: Diagnosis not present

## 2023-12-13 LAB — CUP PACEART REMOTE DEVICE CHECK
Battery Remaining Longevity: 135 mo
Battery Voltage: 3.03 V
Brady Statistic AP VP Percent: 54.81 %
Brady Statistic AP VS Percent: 0 %
Brady Statistic AS VP Percent: 45.69 %
Brady Statistic AS VS Percent: 0.23 %
Brady Statistic RA Percent Paced: 1.54 %
Brady Statistic RV Percent Paced: 99.72 %
Date Time Interrogation Session: 20250409231843
Implantable Lead Connection Status: 753985
Implantable Lead Connection Status: 753985
Implantable Lead Implant Date: 20230908
Implantable Lead Implant Date: 20230908
Implantable Lead Location: 753859
Implantable Lead Location: 753860
Implantable Lead Model: 3830
Implantable Lead Model: 5076
Implantable Pulse Generator Implant Date: 20230908
Lead Channel Impedance Value: 285 Ohm
Lead Channel Impedance Value: 323 Ohm
Lead Channel Impedance Value: 418 Ohm
Lead Channel Impedance Value: 475 Ohm
Lead Channel Pacing Threshold Amplitude: 0.75 V
Lead Channel Pacing Threshold Amplitude: 0.875 V
Lead Channel Pacing Threshold Pulse Width: 0.4 ms
Lead Channel Pacing Threshold Pulse Width: 0.4 ms
Lead Channel Sensing Intrinsic Amplitude: 16.625 mV
Lead Channel Sensing Intrinsic Amplitude: 16.625 mV
Lead Channel Sensing Intrinsic Amplitude: 2.125 mV
Lead Channel Sensing Intrinsic Amplitude: 2.125 mV
Lead Channel Setting Pacing Amplitude: 2 V
Lead Channel Setting Pacing Amplitude: 2.75 V
Lead Channel Setting Pacing Pulse Width: 0.4 ms
Lead Channel Setting Sensing Sensitivity: 1.2 mV
Zone Setting Status: 755011

## 2023-12-18 ENCOUNTER — Encounter: Payer: Self-pay | Admitting: Internal Medicine

## 2023-12-18 NOTE — Progress Notes (Unsigned)
  Electrophysiology Office Note:   ID:  Ahman, [place]ug[place] March 07, 1935, MRN [id number]  Primary Cardiologist: Gaylyn Keas, MD Electrophysiologist: Manya Sells, MD  {Click to update primary MD,subspecialty MD or APP then REFRESH:1}    History of Present Illness:   Joshua Hudson is a 89 y.o. male with h/o Chronic AF/AFL, severe AS s/p AVR and TAVR, HFrecEF, and NICM seen today for routine electrophysiology followup.   Since last being seen in our clinic the patient reports doing ***.  he denies chest pain, palpitations, dyspnea, PND, orthopnea, nausea, vomiting, dizziness, syncope, edema, weight gain, or early satiety.   Review of systems complete and found to be negative unless listed in HPI.   EP Information / Studies Reviewed:    EKG is ordered today. Personal review as below.       PPM Interrogation-  reviewed in detail today,  See PACEART report.  Arrhythmia/Device History CARELINK PPM GT   Physical Exam:   VS:  There were no vitals taken for this visit.   Wt Readings from Last 3 Encounters:  11/16/23 116 lb 1.6 oz (52.7 kg)  10/11/23 114 lb 9.6 oz (52 kg)  08/10/23 112 lb 14.4 oz (51.2 kg)     GEN: No acute distress  NECK: No JVD; No carotid bruits CARDIAC: {EPRHYTHM:28826}, no murmurs, rubs, gallops RESPIRATORY:  Clear to auscultation without rales, wheezing or rhonchi  ABDOMEN: Soft, non-tender, non-distended EXTREMITIES:  {EDEMA LEVEL:28147::"No"} edema; No deformity   ASSESSMENT AND PLAN:    Uncontrolled atrial arrhyhtmia s/p AV node ablation s/p Medtronic PPM  Normal PPM function See Pace Art report No changes today  Persistent AF Continue eliquis 2.5 mg BID for CHA2DS2VASc of at least 4  Valvular heart disease  S/p AVR 2009 > TAVR 2020 Stable on last echo  NICM HFrecEF EF 60% Echo 07/2023  {Click here to Review PMH, Prob List, Meds, Allergies, SHx, FHx  :1}   Disposition:   Follow up with {EPPROVIDERS:28135} {EPFOLLOW  UP:28173}  Signed, Tylene Galla, PA-C

## 2023-12-19 ENCOUNTER — Ambulatory Visit: Attending: Student | Admitting: Student

## 2023-12-19 ENCOUNTER — Encounter: Payer: Self-pay | Admitting: Student

## 2023-12-19 VITALS — BP 138/82 | HR 83 | Ht 63.0 in | Wt 118.0 lb

## 2023-12-19 DIAGNOSIS — I059 Rheumatic mitral valve disease, unspecified: Secondary | ICD-10-CM | POA: Diagnosis not present

## 2023-12-19 DIAGNOSIS — I359 Nonrheumatic aortic valve disorder, unspecified: Secondary | ICD-10-CM

## 2023-12-19 DIAGNOSIS — Z950 Presence of cardiac pacemaker: Secondary | ICD-10-CM | POA: Diagnosis not present

## 2023-12-19 DIAGNOSIS — I482 Chronic atrial fibrillation, unspecified: Secondary | ICD-10-CM | POA: Diagnosis not present

## 2023-12-19 DIAGNOSIS — R0600 Dyspnea, unspecified: Secondary | ICD-10-CM

## 2023-12-19 DIAGNOSIS — I4892 Unspecified atrial flutter: Secondary | ICD-10-CM

## 2023-12-19 DIAGNOSIS — I5042 Chronic combined systolic (congestive) and diastolic (congestive) heart failure: Secondary | ICD-10-CM | POA: Diagnosis not present

## 2023-12-19 LAB — CUP PACEART INCLINIC DEVICE CHECK
Battery Remaining Longevity: 134 mo
Battery Voltage: 3.03 V
Brady Statistic AP VP Percent: 68.89 %
Brady Statistic AP VS Percent: 0 %
Brady Statistic AS VP Percent: 31.02 %
Brady Statistic AS VS Percent: 0.19 %
Brady Statistic RA Percent Paced: 2.64 %
Brady Statistic RV Percent Paced: 99.8 %
Date Time Interrogation Session: 20250416082826
Implantable Lead Connection Status: 753985
Implantable Lead Connection Status: 753985
Implantable Lead Implant Date: 20230908
Implantable Lead Implant Date: 20230908
Implantable Lead Location: 753859
Implantable Lead Location: 753860
Implantable Lead Model: 3830
Implantable Lead Model: 5076
Implantable Pulse Generator Implant Date: 20230908
Lead Channel Impedance Value: 285 Ohm
Lead Channel Impedance Value: 323 Ohm
Lead Channel Impedance Value: 437 Ohm
Lead Channel Impedance Value: 456 Ohm
Lead Channel Pacing Threshold Amplitude: 0.75 V
Lead Channel Pacing Threshold Amplitude: 0.875 V
Lead Channel Pacing Threshold Pulse Width: 0.4 ms
Lead Channel Pacing Threshold Pulse Width: 0.4 ms
Lead Channel Sensing Intrinsic Amplitude: 0.5 mV
Lead Channel Sensing Intrinsic Amplitude: 0.5 mV
Lead Channel Sensing Intrinsic Amplitude: 16.625 mV
Lead Channel Sensing Intrinsic Amplitude: 16.625 mV
Lead Channel Setting Pacing Amplitude: 2 V
Lead Channel Setting Pacing Amplitude: 2.75 V
Lead Channel Setting Pacing Pulse Width: 0.4 ms
Lead Channel Setting Sensing Sensitivity: 1.2 mV
Zone Setting Status: 755011

## 2023-12-19 LAB — BASIC METABOLIC PANEL WITH GFR
BUN/Creatinine Ratio: 14 (ref 10–24)
BUN: 23 mg/dL (ref 8–27)
CO2: 22 mmol/L (ref 20–29)
Calcium: 8.7 mg/dL (ref 8.6–10.2)
Chloride: 102 mmol/L (ref 96–106)
Creatinine, Ser: 1.6 mg/dL — ABNORMAL HIGH (ref 0.76–1.27)
Glucose: 93 mg/dL (ref 70–99)
Potassium: 3.5 mmol/L (ref 3.5–5.2)
Sodium: 143 mmol/L (ref 134–144)
eGFR: 41 mL/min/{1.73_m2} — ABNORMAL LOW (ref >59)

## 2023-12-19 NOTE — Patient Instructions (Signed)
 Medication Instructions:  Your physician recommends that you continue on your current medications as directed. Please refer to the Current Medication list given to you today.  *If you need a refill on your cardiac medications before your next appointment, please call your pharmacy*  Lab Work: BMET-TODAY If you have labs (blood work) drawn today and your tests are completely normal, you will receive your results only by: MyChart Message (if you have MyChart) OR A paper copy in the mail If you have any lab test that is abnormal or we need to change your treatment, we will call you to review the results.  Follow-Up: At [facility], you and your health needs are our priority.  As part of our continuing mission to provide you with exceptional heart care, our providers are all part of one team.  This team includes your primary Cardiologist (physician) and Advanced Practice Providers or APPs (Physician Assistants and Nurse Practitioners) who all work together to provide you with the care you need, when you need it.  Your next appointment:   1 year(s)  Provider:   Joycelyn Noa" Tillery, PA-C   You have been referred to pulmonology. You will be receiving a call to schedule consult.       1st Floor: - Lobby - Registration  - Pharmacy  - Lab - Cafe  2nd Floor: - PV Lab - Diagnostic Testing (echo, CT, nuclear med)  3rd Floor: - Vacant  4th Floor: - TCTS (cardiothoracic surgery) - AFib Clinic - Structural Heart Clinic - Vascular Surgery  - Vascular Ultrasound  5th Floor: - [facility] (general and EP) - Clinical Pharmacy for coumadin, hypertension, lipid, weight-loss medications, and med management appointments    Valet parking services will be available as well.

## 2023-12-20 ENCOUNTER — Other Ambulatory Visit: Payer: Self-pay | Admitting: *Deleted

## 2023-12-20 DIAGNOSIS — Z79899 Other long term (current) drug therapy: Secondary | ICD-10-CM

## 2023-12-26 ENCOUNTER — Other Ambulatory Visit: Payer: Self-pay | Admitting: *Deleted

## 2023-12-26 DIAGNOSIS — D638 Anemia in other chronic diseases classified elsewhere: Secondary | ICD-10-CM

## 2023-12-27 ENCOUNTER — Inpatient Hospital Stay: Payer: Medicare PPO

## 2023-12-27 ENCOUNTER — Inpatient Hospital Stay: Payer: Medicare PPO | Admitting: Hematology and Oncology

## 2023-12-27 VITALS — BP 128/64 | HR 85 | Temp 98.0°F | Resp 18 | Ht 63.0 in | Wt 116.1 lb

## 2023-12-27 DIAGNOSIS — D638 Anemia in other chronic diseases classified elsewhere: Secondary | ICD-10-CM

## 2023-12-27 DIAGNOSIS — D563 Thalassemia minor: Secondary | ICD-10-CM | POA: Diagnosis not present

## 2023-12-27 DIAGNOSIS — E538 Deficiency of other specified B group vitamins: Secondary | ICD-10-CM | POA: Diagnosis not present

## 2023-12-27 DIAGNOSIS — E876 Hypokalemia: Secondary | ICD-10-CM | POA: Diagnosis not present

## 2023-12-27 LAB — CMP (CANCER CENTER ONLY)
ALT: 8 U/L (ref 0–44)
AST: 15 U/L (ref 15–41)
Albumin: 4.2 g/dL (ref 3.5–5.0)
Alkaline Phosphatase: 51 U/L (ref 38–126)
Anion gap: 9 (ref 5–15)
BUN: 30 mg/dL — ABNORMAL HIGH (ref 8–23)
CO2: 31 mmol/L (ref 22–32)
Calcium: 9.2 mg/dL (ref 8.9–10.3)
Chloride: 101 mmol/L (ref 98–111)
Creatinine: 1.83 mg/dL — ABNORMAL HIGH (ref 0.61–1.24)
GFR, Estimated: 35 mL/min — ABNORMAL LOW (ref >60)
Glucose, Bld: 92 mg/dL (ref 70–99)
Potassium: 3.8 mmol/L (ref 3.5–5.1)
Sodium: 141 mmol/L (ref 135–145)
Total Bilirubin: 0.6 mg/dL (ref 0.0–1.2)
Total Protein: 6.3 g/dL — ABNORMAL LOW (ref 6.5–8.1)

## 2023-12-27 LAB — CBC WITH DIFFERENTIAL (CANCER CENTER ONLY)
Abs Immature Granulocytes: 0.05 10*3/uL (ref 0.00–0.07)
Basophils Absolute: 0 10*3/uL (ref 0.0–0.1)
Basophils Relative: 0 %
Eosinophils Absolute: 0.3 10*3/uL (ref 0.0–0.5)
Eosinophils Relative: 3 %
HCT: 32.8 % — ABNORMAL LOW (ref 39.0–52.0)
Hemoglobin: 10.1 g/dL — ABNORMAL LOW (ref 13.0–17.0)
Immature Granulocytes: 1 %
Lymphocytes Relative: 10 %
Lymphs Abs: 1 10*3/uL (ref 0.7–4.0)
MCH: 20 pg — ABNORMAL LOW (ref 26.0–34.0)
MCHC: 30.8 g/dL (ref 30.0–36.0)
MCV: 65 fL — ABNORMAL LOW (ref 80.0–100.0)
Monocytes Absolute: 1 10*3/uL (ref 0.1–1.0)
Monocytes Relative: 10 %
Neutro Abs: 7.8 10*3/uL — ABNORMAL HIGH (ref 1.7–7.7)
Neutrophils Relative %: 76 %
Platelet Count: 171 10*3/uL (ref 150–400)
RBC: 5.05 MIL/uL (ref 4.22–5.81)
RDW: 16.8 % — ABNORMAL HIGH (ref 11.5–15.5)
WBC Count: 10.2 10*3/uL (ref 4.0–10.5)
nRBC: 0 % (ref 0.0–0.2)

## 2023-12-27 LAB — IRON AND IRON BINDING CAPACITY (CC-WL,HP ONLY)
Iron: 48 ug/dL (ref 45–182)
Saturation Ratios: 13 % — ABNORMAL LOW (ref 17.9–39.5)
TIBC: 370 ug/dL (ref 250–450)
UIBC: 322 ug/dL (ref 117–376)

## 2023-12-27 LAB — VITAMIN B12: Vitamin B-12: 537 pg/mL (ref 180–914)

## 2023-12-27 LAB — FERRITIN: Ferritin: 23 ng/mL — ABNORMAL LOW (ref 24–336)

## 2023-12-27 MED ORDER — POTASSIUM CHLORIDE CRYS ER 20 MEQ PO TBCR: 20.0000 meq | EXTENDED_RELEASE_TABLET | Freq: Every day | ORAL | Status: DC

## 2023-12-27 MED ORDER — CYANOCOBALAMIN 1000 MCG/ML IJ SOLN
1000.0000 ug | Freq: Once | INTRAMUSCULAR | Status: AC
  Administered 2023-12-27: 1000 ug via INTRAMUSCULAR
  Filled 2023-12-27: qty 1

## 2023-12-27 MED ORDER — EPOETIN ALFA-EPBX 40000 UNIT/ML IJ SOLN
40000.0000 [IU] | Freq: Once | INTRAMUSCULAR | Status: AC
  Administered 2023-12-27: 40000 [IU] via SUBCUTANEOUS
  Filled 2023-12-27: qty 1

## 2023-12-27 NOTE — Progress Notes (Signed)
 Follow-up of anemia of chronic disease  Patient Care Team: Ruven Coy, MD as PCP - General (Family Medicine) Jacqueline Matsu, MD as PCP - Cardiology (Cardiology) Tammie Fall, MD as PCP - Electrophysiology (Cardiology) Cameron Cea, MD as Consulting Physician (Hematology and Oncology)  DIAGNOSIS:  Encounter Diagnosis  Name Primary?   Anemia of chronic disease Yes    CHIEF COMPLIANT: Follow-up of anemia of chronic disease  HISTORY OF PRESENT ILLNESS:  History of Present Illness The patient, with a history of heart disease, presents for a routine follow-up. He reports no changes in his overall health and is tolerating his injections well. He is currently maintaining a level of 10.1, which is within his goal range of below 10.5.  The patient's daughter, who is present during the visit, reports that the patient's potassium levels were found to be very low during a recent visit to the cardiologist. As a result, the patient's potassium medication has been increased to daily dosing.     ALLERGIES:  has no known allergies.  MEDICATIONS:  Current Outpatient Medications  Medication Sig Dispense Refill   cholestyramine (QUESTRAN) 4 g packet Take 4 g by mouth daily.     CREON 24000-76000 units CPEP as directed Orally three times a day     ELIQUIS  2.5 MG TABS tablet TAKE 1 TABLET(2.5 MG) BY MOUTH TWICE DAILY 180 tablet 1   epoetin  alfa-epbx (RETACRIT ) 40000 UNIT/ML injection Inject 40,000 Units into the skin every 6 (six) weeks. As needed     finasteride  (PROSCAR ) 5 MG tablet Take 5 mg by mouth daily.     fluticasone (FLONASE) 50 MCG/ACT nasal spray Place 1 spray into both nostrils daily as needed for allergies or rhinitis.     JARDIANCE  10 MG TABS tablet TAKE 1 TABLET(10 MG) BY MOUTH DAILY 90 tablet 2   ketotifen  (ZADITOR ) 0.025 % ophthalmic solution Place 1 drop into both eyes 2 (two) times daily as needed (itchy eyes).     levothyroxine  (SYNTHROID ) 88 MCG tablet Take 88 mcg by  mouth every morning.     pantoprazole  (PROTONIX ) 40 MG tablet Take 1 tablet (40 mg total) by mouth 2 (two) times daily before a meal for 30 days, THEN 1 tablet (40 mg total) daily. 120 tablet 0   potassium chloride  SA (KLOR-CON  M) 20 MEQ tablet TAKE 1 TABLET(20 MEQ) BY MOUTH EVERY OTHER DAY 45 tablet 3   predniSONE  (DELTASONE ) 5 MG tablet Take 5 mg by mouth daily with breakfast. TAKING 15mg  QD FOR 3Wks     torsemide  (DEMADEX ) 20 MG tablet TAKE 2 TABLETS BY MOUTH EVERY MORNING AND 1 TABLET IN THE EVENING 270 tablet 1   No current facility-administered medications for this visit.   Facility-Administered Medications Ordered in Other Visits  Medication Dose Route Frequency Provider Last Rate Last Admin   cyanocobalamin  (VITAMIN B12) injection 1,000 mcg  1,000 mcg Intramuscular Once Quantarius Genrich, MD       epoetin  alfa-epbx (RETACRIT ) injection 40,000 Units  40,000 Units Subcutaneous Once Cameron Cea, MD        PHYSICAL EXAMINATION: ECOG PERFORMANCE STATUS: 1 - Symptomatic but completely ambulatory  Vitals:   12/27/23 0754  BP: 128/64  Pulse: 85  Resp: 18  Temp: 98 F (36.7 C)  SpO2: 100%   Filed Weights   12/27/23 0754  Weight: 116 lb 1.6 oz (52.7 kg)    LABORATORY DATA:  I have reviewed the data as listed    Latest Ref Rng & Units 12/19/2023  8:50 AM 12/27/2022    7:37 AM 12/06/2022    7:23 AM  CMP  Glucose 70 - 99 mg/dL 93  97  161   BUN 8 - 27 mg/dL 23  37  33   Creatinine 0.76 - 1.27 mg/dL 0.96  0.45  4.09   Sodium 134 - 144 mmol/L 143  142  140   Potassium 3.5 - 5.2 mmol/L 3.5  3.5  3.9   Chloride 96 - 106 mmol/L 102  103  104   CO2 20 - 29 mmol/L 22  30  30    Calcium 8.6 - 10.2 mg/dL 8.7  9.5  9.5   Total Protein 6.5 - 8.1 g/dL  6.4  6.1   Total Bilirubin 0.3 - 1.2 mg/dL  0.4  0.5   Alkaline Phos 38 - 126 U/L  59  56   AST 15 - 41 U/L  16  16   ALT 0 - 44 U/L  8  9     Lab Results  Component Value Date   WBC 10.2 12/27/2023   HGB 10.1 (L) 12/27/2023   HCT  32.8 (L) 12/27/2023   MCV 65.0 (L) 12/27/2023   PLT 171 12/27/2023   NEUTROABS 7.8 (H) 12/27/2023    ASSESSMENT & PLAN:  Anemia of chronic disease Hemoglobin electrophoresis: Beta thalassemia minor.    Hospitalization 09/22/2019-09/25/2019: A. fib with RVR status post cardioversion, currently on Eliquis  Hospitalization 02/28/2022-03/11/2022: Atrial fibrillation: Cardioversion planned for 03/22/2022. 05/12/22: Ablation and Pacemaker Implantation  06/12/2023: EGD with Botox    Lab review: 09/25/2019: WBC 7.2, hemoglobin 9.6, platelets 145 04/14/22: Hb 7.8, Platelets 91 (PRBC) 06/14/22: Hb 10.4, Pl 151, Cr 1.66, Iron  sat: 16%, Ferritin 287 07/05/22: Hemoglobin 9.7 08/14/22: Hemoglobin 7.4 09/26/2022: Hemoglobin 9.1 (Retacrit  injection today) 04/12/2023: Hemoglobin 10.4 (Retacrit  and starting B12 injections) 05/24/2023: Hemoglobin 9.9 (Retacrit , B12 and IV iron ) 07/05/2023: Hemoglobin 10.8 08/08/2023: Hemoglobin 11.2 09/21/2023: Hemoglobin 11.1 (Retacrit  held) 10/11/2023: Hemoglobin 10.7 12/27/2023: Hemoglobin 10.1 (getting B12 and Retacrit )   IV iron : March 2024, September 2024   Current treatment:  Retacrit  will be held today  B12 injections in 3 weeks    He will come back again in 3 weeks for lab check and transfusions or Retacrit  as needed ------------------------------------- Assessment and Plan Assessment & Plan Anemia of chronic disease Managed with epoetin  alfa-epbx injections. Hemoglobin improved to 10.1, within target range. No adverse effects reported. - Continue epoetin  alfa-epbx injections as long as hemoglobin remains below 10.5.  Hypokalemia Cardiologist evaluation confirmed hypokalemia. Potassium supplementation adjusted to correct deficiency. - Adjust potassium chloride  sa (klor-con  m) to 20 MEQ oral daily.      No orders of the defined types were placed in this encounter.  The patient has a good understanding of the overall plan. he agrees with it. he will call with any  problems that may develop before the next visit here. Total time spent: 30 mins including face to face time and time spent for planning, charting and co-ordination of care   Viinay K Zayd Bonet, MD 12/27/23

## 2023-12-27 NOTE — Assessment & Plan Note (Signed)
 Hemoglobin electrophoresis: Beta thalassemia minor.    Hospitalization 09/22/2019-09/25/2019: A. fib with RVR status post cardioversion, currently on Eliquis  Hospitalization 02/28/2022-03/11/2022: Atrial fibrillation: Cardioversion planned for 03/22/2022. 05/12/22: Ablation and Pacemaker Implantation  06/12/2023: EGD with Botox    Lab review: 09/25/2019: WBC 7.2, hemoglobin 9.6, platelets 145 04/14/22: Hb 7.8, Platelets 91 (PRBC) 06/14/22: Hb 10.4, Pl 151, Cr 1.66, Iron  sat: 16%, Ferritin 287 07/05/22: Hemoglobin 9.7 08/14/22: Hemoglobin 7.4 09/26/2022: Hemoglobin 9.1 (Retacrit  injection today) 04/12/2023: Hemoglobin 10.4 (Retacrit  and starting B12 injections) 05/24/2023: Hemoglobin 9.9 (Retacrit , B12 and IV iron ) 07/05/2023: Hemoglobin 10.8 08/08/2023: Hemoglobin 11.2 09/21/2023: Hemoglobin 11.1 (Retacrit  held) 10/11/2023: Hemoglobin 10.7 12/27/2023: Hemoglobin   IV iron : March 2024, September 2024   Current treatment:  Retacrit  will be held today  B12 injections in 3 weeks    He will come back again in 3 weeks for lab check and transfusions or Retacrit  as needed

## 2023-12-28 ENCOUNTER — Encounter (HOSPITAL_BASED_OUTPATIENT_CLINIC_OR_DEPARTMENT_OTHER): Payer: Self-pay

## 2023-12-29 ENCOUNTER — Other Ambulatory Visit: Payer: Self-pay | Admitting: Physician Assistant

## 2024-01-16 ENCOUNTER — Other Ambulatory Visit: Payer: Self-pay | Admitting: *Deleted

## 2024-01-16 DIAGNOSIS — D638 Anemia in other chronic diseases classified elsewhere: Secondary | ICD-10-CM

## 2024-01-17 ENCOUNTER — Inpatient Hospital Stay: Attending: Hematology and Oncology

## 2024-01-17 ENCOUNTER — Inpatient Hospital Stay

## 2024-01-17 VITALS — BP 123/73 | HR 80 | Temp 98.2°F | Resp 17

## 2024-01-17 DIAGNOSIS — D563 Thalassemia minor: Secondary | ICD-10-CM | POA: Diagnosis not present

## 2024-01-17 DIAGNOSIS — D638 Anemia in other chronic diseases classified elsewhere: Secondary | ICD-10-CM | POA: Diagnosis not present

## 2024-01-17 DIAGNOSIS — E538 Deficiency of other specified B group vitamins: Secondary | ICD-10-CM | POA: Diagnosis not present

## 2024-01-17 LAB — CBC WITH DIFFERENTIAL (CANCER CENTER ONLY)
Abs Immature Granulocytes: 0.06 10*3/uL (ref 0.00–0.07)
Basophils Absolute: 0 10*3/uL (ref 0.0–0.1)
Basophils Relative: 0 %
Eosinophils Absolute: 0.1 10*3/uL (ref 0.0–0.5)
Eosinophils Relative: 1 %
HCT: 33 % — ABNORMAL LOW (ref 39.0–52.0)
Hemoglobin: 10.2 g/dL — ABNORMAL LOW (ref 13.0–17.0)
Immature Granulocytes: 1 %
Lymphocytes Relative: 9 %
Lymphs Abs: 0.9 10*3/uL (ref 0.7–4.0)
MCH: 19.8 pg — ABNORMAL LOW (ref 26.0–34.0)
MCHC: 30.9 g/dL (ref 30.0–36.0)
MCV: 64.1 fL — ABNORMAL LOW (ref 80.0–100.0)
Monocytes Absolute: 0.6 10*3/uL (ref 0.1–1.0)
Monocytes Relative: 6 %
Neutro Abs: 8.2 10*3/uL — ABNORMAL HIGH (ref 1.7–7.7)
Neutrophils Relative %: 83 %
Platelet Count: 182 10*3/uL (ref 150–400)
RBC: 5.15 MIL/uL (ref 4.22–5.81)
RDW: 17.6 % — ABNORMAL HIGH (ref 11.5–15.5)
WBC Count: 9.9 10*3/uL (ref 4.0–10.5)
nRBC: 0 % (ref 0.0–0.2)

## 2024-01-17 LAB — CMP (CANCER CENTER ONLY)
ALT: 9 U/L (ref 0–44)
AST: 14 U/L — ABNORMAL LOW (ref 15–41)
Albumin: 4 g/dL (ref 3.5–5.0)
Alkaline Phosphatase: 61 U/L (ref 38–126)
Anion gap: 6 (ref 5–15)
BUN: 18 mg/dL (ref 8–23)
CO2: 32 mmol/L (ref 22–32)
Calcium: 9.1 mg/dL (ref 8.9–10.3)
Chloride: 101 mmol/L (ref 98–111)
Creatinine: 1.56 mg/dL — ABNORMAL HIGH (ref 0.61–1.24)
GFR, Estimated: 43 mL/min — ABNORMAL LOW (ref >60)
Glucose, Bld: 121 mg/dL — ABNORMAL HIGH (ref 70–99)
Potassium: 3.7 mmol/L (ref 3.5–5.1)
Sodium: 139 mmol/L (ref 135–145)
Total Bilirubin: 0.6 mg/dL (ref 0.0–1.2)
Total Protein: 6.6 g/dL (ref 6.5–8.1)

## 2024-01-17 LAB — IRON AND IRON BINDING CAPACITY (CC-WL,HP ONLY)
Iron: 45 ug/dL (ref 45–182)
Saturation Ratios: 13 % — ABNORMAL LOW (ref 17.9–39.5)
TIBC: 337 ug/dL (ref 250–450)
UIBC: 292 ug/dL (ref 117–376)

## 2024-01-17 LAB — VITAMIN B12: Vitamin B-12: 692 pg/mL (ref 180–914)

## 2024-01-17 LAB — FERRITIN: Ferritin: 38 ng/mL (ref 24–336)

## 2024-01-17 MED ORDER — EPOETIN ALFA-EPBX 40000 UNIT/ML IJ SOLN
40000.0000 [IU] | Freq: Once | INTRAMUSCULAR | Status: AC
  Administered 2024-01-17: 40000 [IU] via SUBCUTANEOUS
  Filled 2024-01-17: qty 1

## 2024-01-21 ENCOUNTER — Encounter: Payer: Self-pay | Admitting: Cardiology

## 2024-01-21 ENCOUNTER — Ambulatory Visit: Attending: Cardiology | Admitting: Cardiology

## 2024-01-21 VITALS — BP 132/78 | HR 85 | Ht 62.0 in | Wt 112.6 lb

## 2024-01-21 DIAGNOSIS — I359 Nonrheumatic aortic valve disorder, unspecified: Secondary | ICD-10-CM

## 2024-01-21 DIAGNOSIS — R609 Edema, unspecified: Secondary | ICD-10-CM | POA: Diagnosis not present

## 2024-01-21 DIAGNOSIS — I2510 Atherosclerotic heart disease of native coronary artery without angina pectoris: Secondary | ICD-10-CM

## 2024-01-21 DIAGNOSIS — I079 Rheumatic tricuspid valve disease, unspecified: Secondary | ICD-10-CM | POA: Diagnosis not present

## 2024-01-21 DIAGNOSIS — R0602 Shortness of breath: Secondary | ICD-10-CM

## 2024-01-21 DIAGNOSIS — I5042 Chronic combined systolic (congestive) and diastolic (congestive) heart failure: Secondary | ICD-10-CM

## 2024-01-21 DIAGNOSIS — I482 Chronic atrial fibrillation, unspecified: Secondary | ICD-10-CM

## 2024-01-21 DIAGNOSIS — I059 Rheumatic mitral valve disease, unspecified: Secondary | ICD-10-CM | POA: Diagnosis not present

## 2024-01-21 DIAGNOSIS — Z79899 Other long term (current) drug therapy: Secondary | ICD-10-CM | POA: Diagnosis not present

## 2024-01-21 DIAGNOSIS — I2583 Coronary atherosclerosis due to lipid rich plaque: Secondary | ICD-10-CM

## 2024-01-21 NOTE — Progress Notes (Signed)
 Remote pacemaker transmission.

## 2024-01-21 NOTE — Progress Notes (Signed)
 Date:  01/21/2024   ID:  Joshua Hudson, DOB [birth year], MRN [id number]  Patient Location:  Home  Provider location:   [place]  PCP:  Joshua Hudson  Cardiologist:  Joshua Keas, Hudson  Electrophysiologist:  Joshua Sells, Hudson   Chief Complaint:  AS, afib, CAD  History of Present Illness:    Joshua Hudson is a 89 y.o. male  with a history of severe AS s/p AVR with a 26 mm Edwards bioprosthetic valve in 2009 with post op afib (with no recurrence), HTN, rheumatoid arthritis on MTX, leflunomide  and prednisone , achalasia, mod MR, and chronic diastolic CHF, hemolytic anemia, bioprosthetic valve failure with severe AI s/p valve-in-valve TAVR (04/15/19).    Patient underwent aortic valve replacement using a 25 mm Edwards magna stented bovine pericardial tissue valve by Joshua Hudson in 2009 for severe symptomatic aortic stenosis.  His early postoperative recovery was notable for postoperative atrial fibrillation.  He was seen by Joshua Hudson in the office on 03/18/2019 for preoperative clearance for redo shoulder arthroplasty. He was noted to have significant lower extremity edema as well as other signs and symptoms of acute heart failure. Follow-up TTE on 03/18/2019 revealed normal left ventricular systolic function with at least moderate AI.  Subsequent TEE on 04/02/2019 confirmed the presence of severe prosthetic valve dysfunction with severe AI.  There was no vegetation on the aortic valve she suggest endocarditis. Cardiac catheterization on 04/02/2019 showed normal coronaries. He was also noted to have worsening anemia. Haptoglobin was low c/w hemolytic anemia.    He underwent successful valve in valve TAVR with a 26 mm Edwards Sapien Ultra THV via the TF approach on 04/15/19. Post operative echo showed EF 60-65%, normally functioning TAVR with mean gradient of 14 mmHg and no AI.   He was found to be in aflutter with RVR on 09/22/2019 when he presented for esophageal dilatation.  He had  been having fatigue, DOE and LE edema.  2D ecoh showed moderately reduced LVF.  He was started on Eliquis  and underwent successful TEE/DCCV to NSR.  2D echo showed EF 45-50% felt to be tachy mediated.  He was seen by EP and underwent aflutter ablation.  He was back in aflutter at OV on 11/11/19 and started on Amio.  He underwent DCCV to NSR on 12/25/2019.  He had a GIB in June 2023 and started on PPI and Eliquis  was held for 1 week.  He went back into atrial fibrillation with RVR in March 10 2022 and underwent TEE/DDCV to NSR.  His EF was 50-55% at that time.  He then went back into afigb and underwent DCCV on 03/28/22.  He has felt poorly since then.  On 05/12/22 he underwent AVN ablation with PPM and is followed by Joshua Hudson.   He is here today for followup and is doing well.  He has chronic DOE that may be a little worse and also chronic LE edema which is stable.  He denies any chest pain or pressure,  PND, orthopnea, dizziness, palpitations or syncope. He is compliant with his meds and is tolerating meds with no SE.    Prior CV studies:   The following studies were reviewed today:   Past Medical History:  Diagnosis Date   Achalasia 06/12/2019   Noted on EGD   Aortic insufficiency 03/24/2019   AI of bioprosthetic AVR severe 4 plus   Atrial flutter (HCC)    Chronic diastolic CHF (congestive heart failure) (HCC) 03/24/2019   Chronic  kidney disease, stage 3b (HCC)    Colon polyps    s/p diverticular perforation requiring 2-stage repair   Derangement of right shoulder joint    need replacing has no use of   Diverticulosis    DJD (degenerative joint disease), lumbosacral    Dysphagia    eats soft food   Esophageal stricture    GERD (gastroesophageal reflux disease)    GIB (gastrointestinal bleeding)    Hemolytic anemia (HCC)    History of blood transfusion given with 04-15-19 surgery   History of blood transfusion 07/18/2019   History of kidney stones    passed stones   Hypertension     Mitral regurgitation    Myxomatous mitral valve with mild to moderate MR on echo 07/2023   Occipital neuralgia    Pacemaker    AVN ablation + MDT PPM 2023   Pancytopenia (HCC)    Postoperative atrial fibrillation (HCC) 05/10/2015   Prosthetic valve dysfunction    Rheumatoid arthritis (HCC)    s/o long term steroids  shoulders and hands   Rotator cuff arthropathy    right   S/P aortic valve replacement with bioprosthetic valve 01/16/2008   25mm Edwards Magna perimount bovine pericardial tissue valve, model 3000   S/P valve-in-valve TAVR 04/15/2019   26 mm Edwards Sapien 3 Ultra transcatheter heart valve placed via percutaneous right transfemoral approach    SBE (subacute bacterial endocarditis) prophylaxis candidate    for dental procedures   Schatzki's ring 06/12/2019   Narrowing, Noted on EGD   Severe aortic stenosis    S/P prosthetic valve replacement w 25 mm Edwards like science percardial tissue valve,Joshua Hudson - 01/2008   Thrombocytopenia (HCC)    Past Surgical History:  Procedure Laterality Date   A-FLUTTER ABLATION N/A 10/10/2019   Procedure: A-FLUTTER ABLATION;  Surgeon: Joshua Fall, Hudson;  Location: [facility];  Service: Cardiovascular;  Laterality: N/A;   APPENDECTOMY     AV NODE ABLATION N/A 05/12/2022   Procedure: AV NODE ABLATION;  Surgeon: Joshua Fall, Hudson;  Location: [facility];  Service: Cardiovascular;  Laterality: N/A;   BALLOON DILATION N/A 06/12/2019   Procedure: BALLOON DILATION;  Surgeon: Joshua Ken, Hudson;  Location: [facility];  Service: Gastroenterology;  Laterality: N/A;   BIOPSY  06/12/2023   Procedure: BIOPSY;  Surgeon: Joshua Ken, Hudson;  Location: [facility];  Service: Gastroenterology;;   BOTOX  INJECTION N/A 01/22/2018   Procedure: BOTOX  INJECTION;  Surgeon: Joshua Ken, Hudson;  Location: [facility];  Service: Gastroenterology;  Laterality: N/A;   BOTOX  INJECTION N/A 06/12/2023   Procedure: BOTOX  INJECTION;  Surgeon: Joshua Ken, Hudson;   Location: [facility];  Service: Gastroenterology;  Laterality: N/A;   CARDIAC CATHETERIZATION     09   CARDIAC VALVE REPLACEMENT  01/2008   aortic valve replacement   CARDIOVERSION N/A 09/25/2019   Procedure: CARDIOVERSION;  Surgeon: Hugh Madura, Hudson;  Location: [facility] ENDOSCOPY;  Service: Cardiovascular;  Laterality: N/A;   CARDIOVERSION N/A 12/25/2019   Procedure: CARDIOVERSION;  Surgeon: Lenise Quince, Hudson;  Location: [facility] ENDOSCOPY;  Service: Cardiovascular;  Laterality: N/A;   CARDIOVERSION N/A 03/08/2022   Procedure: CARDIOVERSION;  Surgeon: Sonny Dust, Hudson;  Location: [facility] ENDOSCOPY;  Service: Cardiovascular;  Laterality: N/A;   CARDIOVERSION N/A 03/28/2022   Procedure: CARDIOVERSION;  Surgeon: Jerryl Morin, DO;  Location: [facility] ENDOS[facility];  Service: Cardiovascular;  Laterality: N/A;   CATARACT EXTRACTION W/ INTRAOCULAR LENS  IMPLANT, BILATERAL     COLON RESECTION  diverticulitis    COLONOSCOPY N/A 03/03/2022   Procedure: COLONOSCOPY;  Surgeon: Felecia Hopper, Hudson;  Location: [facility];  Service: Gastroenterology;  Laterality: N/A;   CORONARY ANGIOPLASTY     dental implants     permanent   ENTEROSCOPY N/A 03/03/2022   Procedure: ENTEROSCOPY;  Surgeon: Felecia Hopper, Hudson;  Location: [facility];  Service: Gastroenterology;  Laterality: N/A;   ESOPHAGEAL MANOMETRY N/A 11/07/2017   Procedure: ESOPHAGEAL MANOMETRY (EM);  Surgeon: Joshua Ken, Hudson;  Location: [facility];  Service: Gastroenterology;  Laterality: N/A;   ESOPHAGOGASTRODUODENOSCOPY N/A 02/24/2022   Procedure: ESOPHAGOGASTRODUODENOSCOPY (EGD);  Surgeon: Evangeline Hilts, Hudson;  Location: [facility];  Service: Gastroenterology;  Laterality: N/A;   ESOPHAGOGASTRODUODENOSCOPY (EGD) WITH PROPOFOL  N/A 01/22/2018   Procedure: ESOPHAGOGASTRODUODENOSCOPY (EGD) WITH PROPOFOL ;  Surgeon: Joshua Ken, Hudson;  Location: [facility];  Service: Gastroenterology;  Laterality: N/A;   ESOPHAGOGASTRODUODENOSCOPY (EGD) WITH PROPOFOL  N/A  04/30/2019   Procedure: ESOPHAGOGASTRODUODENOSCOPY (EGD) WITH PROPOFOL ;  Surgeon: Jolinda Necessary, Hudson;  Location: [facility] ENDOSCOPY;  Service: Endoscopy;  Laterality: N/A;   ESOPHAGOGASTRODUODENOSCOPY (EGD) WITH PROPOFOL  N/A 06/12/2019   Procedure: ESOPHAGOGASTRODUODENOSCOPY (EGD) WITH PROPOFOL ;  Surgeon: Joshua Ken, Hudson;  Location: [facility];  Service: Gastroenterology;  Laterality: N/A;  with botox  injection   ESOPHAGOGASTRODUODENOSCOPY (EGD) WITH PROPOFOL  N/A 06/12/2023   Procedure: ESOPHAGOGASTRODUODENOSCOPY (EGD) WITH PROPOFOL ;  Surgeon: Joshua Ken, Hudson;  Location: [facility];  Service: Gastroenterology;  Laterality: N/A;  with dilation and botox  injection   EXCISIONAL TOTAL SHOULDER ARTHROPLASTY WITH ANTIBIOTIC SPACER Right 12/31/2018   Procedure: EXCISIONAL TOTAL SHOULDER ARTHROPLASTY WITH ANTIBIOTIC SPACER;  Surgeon: Winston Hawking, Hudson;  Location: [facility] OR;  Service: Orthopedics;  Laterality: Right;   GIVENS CAPSULE STUDY N/A 03/01/2022   Procedure: GIVENS CAPSULE STUDY;  Surgeon: Felecia Hopper, Hudson;  Location: [facility];  Service: Gastroenterology;  Laterality: N/A;   HEMOSTASIS CLIP PLACEMENT  03/03/2022   Procedure: HEMOSTASIS CLIP PLACEMENT;  Surgeon: Felecia Hopper, Hudson;  Location: [facility];  Service: Gastroenterology;;   HERNIA REPAIR     HOT HEMOSTASIS N/A 03/03/2022   Procedure: HOT HEMOSTASIS (ARGON PLASMA COAGULATION/BICAP);  Surgeon: Felecia Hopper, Hudson;  Location: [facility];  Service: Gastroenterology;  Laterality: N/A;  EGD and COLON   INTRAOPERATIVE TRANSTHORACIC ECHOCARDIOGRAM N/A 04/15/2019   Procedure: Intraoperative Transthoracic Echocardiogram;  Surgeon: Arnoldo Lapping, Hudson;  Location: [facility] OR;  Service: Open Heart Surgery;  Laterality: N/A;   IRRIGATION AND DEBRIDEMENT SHOULDER Right 11/20/2018    IRRIGATION AND DEBRIDEMENT SHOULDER WITH POLY EXCHANGE (Right Shoulder)   IRRIGATION AND DEBRIDEMENT SHOULDER Right 11/20/2018   Procedure: IRRIGATION AND DEBRIDEMENT  SHOULDER WITH POLY EXCHANGE;  Surgeon: Winston Hawking, Hudson;  Location: [facility] OR;  Service: Orthopedics;  Laterality: Right;   LUMBAR LAMINECTOMY     x 2   PACEMAKER IMPLANT N/A 05/12/2022   Procedure: PACEMAKER IMPLANT;  Surgeon: Joshua Fall, Hudson;  Location: [facility];  Service: Cardiovascular;  Laterality: N/A;   POLYPECTOMY  03/03/2022   Procedure: POLYPECTOMY;  Surgeon: Felecia Hopper, Hudson;  Location: [facility];  Service: Gastroenterology;;   REVERSE SHOULDER ARTHROPLASTY Right 03/01/2018   Procedure: RIGHT REVERSE SHOULDER ARTHROPLASTY;  Surgeon: Winston Hawking, Hudson;  Location: [facility] OR;  Service: Orthopedics;  Laterality: Right;   REVERSE SHOULDER ARTHROPLASTY Right 07/18/2019   Procedure: REVERSE TOTAL SHOULDER ARTHROPLASTY and removal of antiobotic spacer;  Surgeon: Winston Hawking, Hudson;  Location: [facility] ORS;  Service: Orthopedics;  Laterality: Right;  interscalene block   REVERSE SHOULDER ARTHROPLASTY Right 08/06/2019   Procedure: Reduction of dislocated reverse  total shoulder and poly exchange;  Surgeon: Winston Hawking, Hudson;  Location: [facility] ORS;  Service: Orthopedics;  Laterality: Right;  need 1 hour   RIGHT/LEFT HEART CATH AND CORONARY ANGIOGRAPHY N/A 04/02/2019   Procedure: RIGHT/LEFT HEART CATH AND CORONARY ANGIOGRAPHY;  Surgeon: Arleen Lacer, Hudson;  Location: [facility] INVASIVE CV LAB;  Service: Cardiovascular;  Laterality: N/A;   SAVORY DILATION N/A 01/22/2018   Procedure: SAVORY DILATION;  Surgeon: Joshua Ken, Hudson;  Location: [facility];  Service: Gastroenterology;  Laterality: N/A;   SAVORY DILATION N/A 04/30/2019   Procedure: SAVORY DILATION;  Surgeon: Jolinda Necessary, Hudson;  Location: [facility] ENDOSCOPY;  Service: Endoscopy;  Laterality: N/A;  With fluro   SHOULDER HEMI-ARTHROPLASTY Right 06/14/2018   Procedure: RIGHT  REVERSE TOTAL SHOULDER OPEN POLY EXCHANGE;  Surgeon: Winston Hawking, Hudson;  Location: [facility] OR;  Service: Orthopedics;  Laterality: Right;   SUBMUCOSAL INJECTION  06/12/2019   Procedure:  SUBMUCOSAL INJECTION;  Surgeon: Joshua Ken, Hudson;  Location: [facility];  Service: Gastroenterology;;   TEE WITHOUT CARDIOVERSION N/A 01/29/2014   Procedure: TRANSESOPHAGEAL ECHOCARDIOGRAM (TEE);  Surgeon: Jacqueline Matsu, Hudson;  Location: [facility] ENDOSCOPY;  Service: Cardiovascular;  Laterality: N/A;   TEE WITHOUT CARDIOVERSION N/A 04/02/2019   Procedure: TRANSESOPHAGEAL ECHOCARDIOGRAM (TEE);  Surgeon: Loyde Rule, Hudson;  Location: [facility] ENDOSCOPY;  Service: Cardiovascular;  Laterality: N/A;   TEE WITHOUT CARDIOVERSION N/A 09/25/2019   Procedure: TRANSESOPHAGEAL ECHOCARDIOGRAM (TEE);  Surgeon: Hugh Madura, Hudson;  Location: [facility] ENDOSCOPY;  Service: Cardiovascular;  Laterality: N/A;   TEE WITHOUT CARDIOVERSION N/A 03/08/2022   Procedure: TRANSESOPHAGEAL ECHOCARDIOGRAM (TEE);  Surgeon: Sonny Dust, Hudson;  Location: [facility] ENDOSCOPY;  Service: Cardiovascular;  Laterality: N/A;   TONSILLECTOMY     TRANSCATHETER AORTIC VALVE REPLACEMENT, TRANSFEMORAL  04/15/2019   TRANSCATHETER AORTIC VALVE REPLACEMENT, TRANSFEMORAL N/A 04/15/2019   Procedure: TRANSCATHETER AORTIC VALVE REPLACEMENT, TRANSFEMORAL with POST BALLOON DILATION;  Surgeon: Arnoldo Lapping, Hudson;  Location: [facility] OR;  Service: Open Heart Surgery;  Laterality: N/A;     Current Meds  Medication Sig   cholestyramine (QUESTRAN) 4 g packet Take 4 g by mouth daily.   CREON 24000-76000 units CPEP as directed Orally three times a day   ELIQUIS  2.5 MG TABS tablet TAKE 1 TABLET(2.5 MG) BY MOUTH TWICE DAILY   epoetin  alfa-epbx (RETACRIT ) 40000 UNIT/ML injection Inject 40,000 Units into the skin every 6 (six) weeks. As needed   finasteride  (PROSCAR ) 5 MG tablet Take 5 mg by mouth daily.   fluticasone (FLONASE) 50 MCG/ACT nasal spray Place 1 spray into both nostrils daily as needed for allergies or rhinitis.   JARDIANCE  10 MG TABS tablet TAKE 1 TABLET(10 MG) BY MOUTH DAILY   ketotifen  (ZADITOR ) 0.025 % ophthalmic solution Place 1 drop into both eyes 2 (two) times  daily as needed (itchy eyes).   levothyroxine  (SYNTHROID ) 88 MCG tablet Take 88 mcg by mouth every morning.   potassium chloride  SA (KLOR-CON  M) 20 MEQ tablet Take 1 tablet (20 mEq total) by mouth daily.   predniSONE  (DELTASONE ) 5 MG tablet Take 5 mg by mouth daily with breakfast. TAKING 15mg  QD FOR 3Wks   torsemide  (DEMADEX ) 20 MG tablet TAKE 2 TABLETS BY MOUTH EVERY MORNING AND 1 TABLET IN THE EVENING     Allergies:   Patient has no known allergies.   Social History   Tobacco Use   Smoking status: Former    Current packs/day: 0.00    Average packs/day: 0.5 packs/day for 10.0 years (5.0 ttl pk-yrs)  Types: Cigarettes    Start date: 01/16/1974    Quit date: 09/05/1983    Years since quitting: 40.4   Smokeless tobacco: Former    Types: Associate Professor status: Never Used  Substance Use Topics   Alcohol  use: Not Currently    Alcohol /week: 0.0 standard drinks of alcohol    Drug use: No     Family Hx: The patient's family history includes Arthritis in his father; Heart Problems in his sister; Heart disease in his father; Other in his brother, daughter, mother, and sister; Suicidality in his brother.  ROS:   Please see the history of present illness.     All other systems reviewed and are negative.   Labs/Other Tests and Data Reviewed:    Recent Labs: 01/17/2024: ALT 9; BUN 18; Creatinine 1.56; Hemoglobin 10.2; Platelet Count 182; Potassium 3.7; Sodium 139   Recent Lipid Panel Lab Results  Component Value Date/Time   CHOL  01/14/2008 04:50 AM    174        ATP III CLASSIFICATION:  <200     mg/dL   Desirable  161-096  mg/dL   Borderline High  >=045    mg/dL   High   TRIG 43 40/98/1191 04:50 AM   HDL 65 01/14/2008 04:50 AM   CHOLHDL 2.7 01/14/2008 04:50 AM   LDLCALC (H) 01/14/2008 04:50 AM    100        Total Cholesterol/HDL:CHD Risk Coronary Heart Disease Risk Table                     Men   Women  1/2 Average Risk   3.4   3.3    Wt Readings from Last 3  Encounters:  01/21/24 112 lb 9.6 oz (51.1 kg)  12/27/23 116 lb 1.6 oz (52.7 kg)  12/19/23 118 lb (53.5 kg)     Objective:    Vital Signs:  BP 132/78   Pulse 85   Ht 5\' 2"  (1.575 m)   Wt 112 lb 9.6 oz (51.1 kg)   SpO2 99%   BMI 20.59 kg/m    GEN: Well nourished, well developed in no acute distress HEENT: Normal NECK: No JVD; No carotid bruits LYMPHATICS: No lymphadenopathy CARDIAC:irregularly irregular, no murmurs, rubs, gallops RESPIRATORY:  Clear to auscultation without rales, wheezing or rhonchi  ABDOMEN: Soft, non-tender, non-distended MUSCULOSKELETAL:  2+ LE edema L>R; No deformity  SKIN: Warm and dry NEUROLOGIC:  Alert and oriented x 3 PSYCHIATRIC:  Normal affect  ASSESSMENT & PLAN:    Aortic Insufficiency.Severe AS -severe bioprosthetic AI  -s/p valve in valve TAVR in 04/2019 -2D echo 07/25/2023 showed EF 60% with stable TAVR valve in valve with a 26 mm ultra stented TAVR valve with mean gradient 10 mmHg and V-max 2.4 m/s.  There was no perivalvular AI  Mitral and tricuspid valve disease -2D echo 07/25/2023 showed myxomatous mitral and tricuspid valves with mild to moderate MR and TR -Repeating 2D echo 07/24/2024   Non obstructive ASCAD -nonobstructive by cath 2009 with 20-30% OM2 -nonobstructive by coronary CTA 2015 in all 3 vessels -he has chronic DOE due to diastolic CHF which remains stable -He has not had any anginal symptoms since I saw him last -cardiac cath at time of TAVR showed normal coronary arteries -No ASA due to DOAC   SOB -this is chronic and suspect multifactorial from anemia, CHF, PAF  -normal functioning TAVR by echo 07/25/2023 -nonischemic DCM noted 09/2019 due to  aflutter with RVR -EF 60% on echo 07/2023  LE edema -he has had problems with significant LE edema but does not tolerate compression hose and BP has been too low for additional demadex  -I think he gets too much added Na in his diet from him salting his food and eating out a  lot -encouraged him to follow a < 2gm Na diet - Continue Demadex  40 mg every morning and 20 mg every afternoon -he does not like thigh high compression hose -I have personally reviewed and interpreted outside labs performed by patient's PCP which showed serum creatinine 1.83 and potassium 3.8 on 12/27/2023  Chronic  combined systolic/diastolic CHF/NICM -chronic SOB and LE edema likely multifactorial due to anemia, dietary indiscretion from Na, atrial flutter and anemia -EF 60% on echo 07/25/2023 -He does not appear volume overloaded on exam today except for his chronic LE edema -SOB has gotten slightly worse so will check a BNP -Continue dietary restriction of Na and continue diuretics -Continue Jardiance  10 mg daily and torsemide  40 mg every morning and 20 mg every afternoon with as needed refill  Chronic atrial fibrillation/paroxysmal atrial flutter -s/p aflutter ablation -reverted back to aflutter and started on Amio -He has had cardioversions in the past with most recent TEE/DCCV 03/08/2022 with EARF and then recurrent afib s/p DCCV in late July 23 -Unfortunately continued to feel poorly and now is status post AV node ablation with permanent pacemaker on 05/12/2022 -Seen back in EP clinic on 06/15/2022 and was back in A-fib and found to be pacer dependent and not conducting status post AV node ablation -He denies bleeding problems on DOAC -occasionally will feel his heart beat irregular -Continue Eliquis  2.5 mg twice daily (dosed for age > 80 and serum creatinine >1.5) with as needed refills -followed by EP  [id number] -followed by PCP -SCr 1.83 at last blood draw  Medication Adjustments/Labs and Tests Ordered: Current medicines are reviewed at length with the patient today.  Concerns regarding medicines are outlined above.  Tests Ordered: No orders of the defined types were placed in this encounter.  Medication Changes: No orders of the defined types were placed in this  encounter.   Disposition:  Follow up 1 year  Signed, Joshua Keas, Hudson  01/21/2024 9:01 AM     [facility]

## 2024-01-21 NOTE — Patient Instructions (Signed)
 Medication Instructions:  Your physician recommends that you continue on your current medications as directed. Please refer to the Current Medication list given to you today.  *If you need a refill on your cardiac medications before your next appointment, please call your pharmacy*  Lab Work: Please complete a BNP and a BMET today in our lab before you leave.  If you have labs (blood work) drawn today and your tests are completely normal, you will receive your results only by: MyChart Message (if you have MyChart) OR A paper copy in the mail If you have any lab test that is abnormal or we need to change your treatment, we will call you to review the results.  Testing/Procedures: None.  Follow-Up:  Your next appointment:   1 year(s)  Provider:   Gaylyn Keas, MD

## 2024-01-21 NOTE — Addendum Note (Signed)
 Addended by: Cherylyn Cos on: 01/21/2024 09:22 AM   Modules accepted: Orders

## 2024-01-22 ENCOUNTER — Ambulatory Visit: Payer: Self-pay | Admitting: Cardiology

## 2024-01-22 DIAGNOSIS — Z79899 Other long term (current) drug therapy: Secondary | ICD-10-CM

## 2024-01-22 DIAGNOSIS — I5021 Acute systolic (congestive) heart failure: Secondary | ICD-10-CM

## 2024-01-22 LAB — BASIC METABOLIC PANEL WITH GFR
BUN/Creatinine Ratio: 14 (ref 10–24)
BUN: 22 mg/dL (ref 8–27)
CO2: 21 mmol/L (ref 20–29)
Calcium: 9.6 mg/dL (ref 8.6–10.2)
Chloride: 99 mmol/L (ref 96–106)
Creatinine, Ser: 1.61 mg/dL — ABNORMAL HIGH (ref 0.76–1.27)
Glucose: 101 mg/dL — ABNORMAL HIGH (ref 70–99)
Potassium: 3.9 mmol/L (ref 3.5–5.2)
Sodium: 142 mmol/L (ref 134–144)
eGFR: 41 mL/min/{1.73_m2} — ABNORMAL LOW (ref >59)

## 2024-01-22 LAB — PRO B NATRIURETIC PEPTIDE: NT-Pro BNP: 2273 pg/mL — ABNORMAL HIGH (ref 0–486)

## 2024-02-01 DIAGNOSIS — I5021 Acute systolic (congestive) heart failure: Secondary | ICD-10-CM | POA: Diagnosis not present

## 2024-02-01 DIAGNOSIS — Z79899 Other long term (current) drug therapy: Secondary | ICD-10-CM | POA: Diagnosis not present

## 2024-02-02 LAB — BASIC METABOLIC PANEL WITH GFR
BUN/Creatinine Ratio: 15 (ref 10–24)
BUN: 25 mg/dL (ref 8–27)
CO2: 21 mmol/L (ref 20–29)
Calcium: 9.3 mg/dL (ref 8.6–10.2)
Chloride: 99 mmol/L (ref 96–106)
Creatinine, Ser: 1.65 mg/dL — ABNORMAL HIGH (ref 0.76–1.27)
Glucose: 119 mg/dL — ABNORMAL HIGH (ref 70–99)
Potassium: 3.7 mmol/L (ref 3.5–5.2)
Sodium: 140 mmol/L (ref 134–144)
eGFR: 40 mL/min/{1.73_m2} — ABNORMAL LOW (ref >59)

## 2024-02-02 LAB — BRAIN NATRIURETIC PEPTIDE: BNP: 266.7 pg/mL — ABNORMAL HIGH (ref 0.0–100.0)

## 2024-02-05 DIAGNOSIS — H353211 Exudative age-related macular degeneration, right eye, with active choroidal neovascularization: Secondary | ICD-10-CM | POA: Diagnosis not present

## 2024-02-06 ENCOUNTER — Other Ambulatory Visit: Payer: Self-pay | Admitting: *Deleted

## 2024-02-06 DIAGNOSIS — D638 Anemia in other chronic diseases classified elsewhere: Secondary | ICD-10-CM

## 2024-02-06 NOTE — Addendum Note (Signed)
 Addended by: Cherylyn Cos on: 02/06/2024 01:50 PM   Modules accepted: Orders

## 2024-02-06 NOTE — Telephone Encounter (Signed)
 Call to patient, spoke with dtr Joshua Hudson (DPR). Advised BNP mildly elevated, Dr. Micael Adas recommends doing torsemide  40 mg BID for 3 more days and then decreasing back to 40 mg AM and 20 MG PM. Joshua Hudson states she did not stop the torsemide  40 mg BID after he completed labs 4 days ago. Advised Joshua Hudson to go back to 40 mg AM and 20 mg PM and repeat BNP/BMET in 1 week. Joshua Hudson agrees to plan, orders placed.

## 2024-02-06 NOTE — Telephone Encounter (Signed)
-----   Message from Gaylyn Keas sent at 02/04/2024  9:08 AM EDT ----- BNP mildly elevated otherwise labs are stable.  Increase Torsemide  to 40mg  BID for 3 days and then back to 40mg  qam and 20mg  qpm.  CHeck BNP and BMET on Friday

## 2024-02-07 ENCOUNTER — Inpatient Hospital Stay

## 2024-02-07 ENCOUNTER — Inpatient Hospital Stay: Attending: Hematology and Oncology

## 2024-02-07 VITALS — BP 147/81 | HR 92 | Temp 97.7°F | Resp 16

## 2024-02-07 DIAGNOSIS — E538 Deficiency of other specified B group vitamins: Secondary | ICD-10-CM | POA: Diagnosis not present

## 2024-02-07 DIAGNOSIS — D638 Anemia in other chronic diseases classified elsewhere: Secondary | ICD-10-CM

## 2024-02-07 DIAGNOSIS — D563 Thalassemia minor: Secondary | ICD-10-CM | POA: Insufficient documentation

## 2024-02-07 LAB — CMP (CANCER CENTER ONLY)
ALT: 7 U/L (ref 0–44)
AST: 13 U/L — ABNORMAL LOW (ref 15–41)
Albumin: 4 g/dL (ref 3.5–5.0)
Alkaline Phosphatase: 53 U/L (ref 38–126)
Anion gap: 7 (ref 5–15)
BUN: 24 mg/dL — ABNORMAL HIGH (ref 8–23)
CO2: 30 mmol/L (ref 22–32)
Calcium: 9.1 mg/dL (ref 8.9–10.3)
Chloride: 103 mmol/L (ref 98–111)
Creatinine: 1.64 mg/dL — ABNORMAL HIGH (ref 0.61–1.24)
GFR, Estimated: 40 mL/min — ABNORMAL LOW (ref >60)
Glucose, Bld: 106 mg/dL — ABNORMAL HIGH (ref 70–99)
Potassium: 3.5 mmol/L (ref 3.5–5.1)
Sodium: 140 mmol/L (ref 135–145)
Total Bilirubin: 0.5 mg/dL (ref 0.0–1.2)
Total Protein: 6.4 g/dL — ABNORMAL LOW (ref 6.5–8.1)

## 2024-02-07 LAB — CBC WITH DIFFERENTIAL (CANCER CENTER ONLY)
Abs Immature Granulocytes: 0.05 10*3/uL (ref 0.00–0.07)
Basophils Absolute: 0 10*3/uL (ref 0.0–0.1)
Basophils Relative: 0 %
Eosinophils Absolute: 0.1 10*3/uL (ref 0.0–0.5)
Eosinophils Relative: 1 %
HCT: 31.2 % — ABNORMAL LOW (ref 39.0–52.0)
Hemoglobin: 9.5 g/dL — ABNORMAL LOW (ref 13.0–17.0)
Immature Granulocytes: 1 %
Lymphocytes Relative: 7 %
Lymphs Abs: 0.6 10*3/uL — ABNORMAL LOW (ref 0.7–4.0)
MCH: 19.3 pg — ABNORMAL LOW (ref 26.0–34.0)
MCHC: 30.4 g/dL (ref 30.0–36.0)
MCV: 63.3 fL — ABNORMAL LOW (ref 80.0–100.0)
Monocytes Absolute: 0.4 10*3/uL (ref 0.1–1.0)
Monocytes Relative: 5 %
Neutro Abs: 8.3 10*3/uL — ABNORMAL HIGH (ref 1.7–7.7)
Neutrophils Relative %: 86 %
Platelet Count: 165 10*3/uL (ref 150–400)
RBC: 4.93 MIL/uL (ref 4.22–5.81)
RDW: 18 % — ABNORMAL HIGH (ref 11.5–15.5)
WBC Count: 9.5 10*3/uL (ref 4.0–10.5)
nRBC: 0 % (ref 0.0–0.2)

## 2024-02-07 LAB — SAMPLE TO BLOOD BANK

## 2024-02-07 MED ORDER — EPOETIN ALFA-EPBX 40000 UNIT/ML IJ SOLN
40000.0000 [IU] | Freq: Once | INTRAMUSCULAR | Status: AC
  Administered 2024-02-07: 40000 [IU] via SUBCUTANEOUS
  Filled 2024-02-07: qty 1

## 2024-02-07 MED ORDER — CYANOCOBALAMIN 1000 MCG/ML IJ SOLN
1000.0000 ug | Freq: Once | INTRAMUSCULAR | Status: AC
  Administered 2024-02-07: 1000 ug via INTRAMUSCULAR
  Filled 2024-02-07: qty 1

## 2024-02-13 DIAGNOSIS — I5021 Acute systolic (congestive) heart failure: Secondary | ICD-10-CM | POA: Diagnosis not present

## 2024-02-13 DIAGNOSIS — Z79899 Other long term (current) drug therapy: Secondary | ICD-10-CM | POA: Diagnosis not present

## 2024-02-13 LAB — BASIC METABOLIC PANEL WITH GFR
BUN/Creatinine Ratio: 11 (ref 10–24)
BUN: 23 mg/dL (ref 8–27)
CO2: 23 mmol/L (ref 20–29)
Calcium: 9.2 mg/dL (ref 8.6–10.2)
Chloride: 101 mmol/L (ref 96–106)
Creatinine, Ser: 2.14 mg/dL — ABNORMAL HIGH (ref 0.76–1.27)
Glucose: 107 mg/dL — ABNORMAL HIGH (ref 70–99)
Potassium: 4.2 mmol/L (ref 3.5–5.2)
Sodium: 145 mmol/L — ABNORMAL HIGH (ref 134–144)
eGFR: 29 mL/min/{1.73_m2} — ABNORMAL LOW (ref >59)

## 2024-02-14 NOTE — Addendum Note (Signed)
 Addended by: Luwana Salvo on: 02/14/2024 05:32 PM   Modules accepted: Orders

## 2024-02-20 DIAGNOSIS — Z79899 Other long term (current) drug therapy: Secondary | ICD-10-CM | POA: Diagnosis not present

## 2024-02-20 DIAGNOSIS — I5021 Acute systolic (congestive) heart failure: Secondary | ICD-10-CM | POA: Diagnosis not present

## 2024-02-21 LAB — BASIC METABOLIC PANEL WITH GFR
BUN/Creatinine Ratio: 12 (ref 10–24)
BUN: 21 mg/dL (ref 8–27)
CO2: 22 mmol/L (ref 20–29)
Calcium: 9.1 mg/dL (ref 8.6–10.2)
Chloride: 102 mmol/L (ref 96–106)
Creatinine, Ser: 1.81 mg/dL — ABNORMAL HIGH (ref 0.76–1.27)
Glucose: 102 mg/dL — ABNORMAL HIGH (ref 70–99)
Potassium: 4.2 mmol/L (ref 3.5–5.2)
Sodium: 141 mmol/L (ref 134–144)
eGFR: 36 mL/min/{1.73_m2} — ABNORMAL LOW (ref >59)

## 2024-02-21 LAB — PRO B NATRIURETIC PEPTIDE: NT-Pro BNP: 2297 pg/mL — ABNORMAL HIGH (ref 0–486)

## 2024-02-22 ENCOUNTER — Encounter: Payer: Self-pay | Admitting: Cardiology

## 2024-02-24 ENCOUNTER — Other Ambulatory Visit: Payer: Self-pay | Admitting: Nurse Practitioner

## 2024-02-27 ENCOUNTER — Other Ambulatory Visit: Payer: Self-pay

## 2024-02-27 ENCOUNTER — Other Ambulatory Visit: Payer: Self-pay | Admitting: *Deleted

## 2024-02-27 DIAGNOSIS — D638 Anemia in other chronic diseases classified elsewhere: Secondary | ICD-10-CM

## 2024-02-27 DIAGNOSIS — D696 Thrombocytopenia, unspecified: Secondary | ICD-10-CM

## 2024-02-27 NOTE — Assessment & Plan Note (Signed)
 Hemoglobin electrophoresis: Beta thalassemia minor.    Hospitalization 09/22/2019-09/25/2019: A. fib with RVR status post cardioversion, currently on Eliquis  Hospitalization 02/28/2022-03/11/2022: Atrial fibrillation: Cardioversion planned for 03/22/2022. 05/12/22: Ablation and Pacemaker Implantation  06/12/2023: EGD with Botox    Lab review: 09/25/2019: WBC 7.2, hemoglobin 9.6, platelets 145 04/14/22: Hb 7.8, Platelets 91 (PRBC) 06/14/22: Hb 10.4, Pl 151, Cr 1.66, Iron  sat: 16%, Ferritin 287 07/05/22: Hemoglobin 9.7 08/14/22: Hemoglobin 7.4 09/26/2022: Hemoglobin 9.1 (Retacrit  injection today) 04/12/2023: Hemoglobin 10.4 (Retacrit  and starting B12 injections) 05/24/2023: Hemoglobin 9.9 (Retacrit , B12 and IV iron ) 07/05/2023: Hemoglobin 10.8 08/08/2023: Hemoglobin 11.2 09/21/2023: Hemoglobin 11.1 (Retacrit  held) 10/11/2023: Hemoglobin 10.7 12/27/2023: Hemoglobin 10.1 (getting B12 and Retacrit )   IV iron : March 2024, September 2024   Current treatment:  Retacrit  will be held today  B12 injections in 3 weeks    He will come back again in 3 weeks for lab check and transfusions or Retacrit  as needed

## 2024-02-28 ENCOUNTER — Inpatient Hospital Stay

## 2024-02-28 ENCOUNTER — Inpatient Hospital Stay: Admitting: Hematology and Oncology

## 2024-02-28 VITALS — BP 140/71 | HR 88 | Temp 97.7°F | Resp 17 | Ht 62.0 in | Wt 115.2 lb

## 2024-02-28 VITALS — BP 144/81 | HR 88 | Temp 98.0°F | Resp 16

## 2024-02-28 DIAGNOSIS — D631 Anemia in chronic kidney disease: Secondary | ICD-10-CM

## 2024-02-28 DIAGNOSIS — E538 Deficiency of other specified B group vitamins: Secondary | ICD-10-CM | POA: Diagnosis not present

## 2024-02-28 DIAGNOSIS — D563 Thalassemia minor: Secondary | ICD-10-CM | POA: Diagnosis not present

## 2024-02-28 DIAGNOSIS — D638 Anemia in other chronic diseases classified elsewhere: Secondary | ICD-10-CM

## 2024-02-28 DIAGNOSIS — N189 Chronic kidney disease, unspecified: Secondary | ICD-10-CM

## 2024-02-28 LAB — CMP (CANCER CENTER ONLY)
ALT: 7 U/L (ref 0–44)
AST: 13 U/L — ABNORMAL LOW (ref 15–41)
Albumin: 4.2 g/dL (ref 3.5–5.0)
Alkaline Phosphatase: 53 U/L (ref 38–126)
Anion gap: 9 (ref 5–15)
BUN: 23 mg/dL (ref 8–23)
CO2: 30 mmol/L (ref 22–32)
Calcium: 9.3 mg/dL (ref 8.9–10.3)
Chloride: 101 mmol/L (ref 98–111)
Creatinine: 1.88 mg/dL — ABNORMAL HIGH (ref 0.61–1.24)
GFR, Estimated: 34 mL/min — ABNORMAL LOW (ref >60)
Glucose, Bld: 111 mg/dL — ABNORMAL HIGH (ref 70–99)
Potassium: 3.9 mmol/L (ref 3.5–5.1)
Sodium: 140 mmol/L (ref 135–145)
Total Bilirubin: 0.5 mg/dL (ref 0.0–1.2)
Total Protein: 6.6 g/dL (ref 6.5–8.1)

## 2024-02-28 LAB — CBC WITH DIFFERENTIAL (CANCER CENTER ONLY)
Abs Immature Granulocytes: 0.06 10*3/uL (ref 0.00–0.07)
Basophils Absolute: 0 10*3/uL (ref 0.0–0.1)
Basophils Relative: 0 %
Eosinophils Absolute: 0.2 10*3/uL (ref 0.0–0.5)
Eosinophils Relative: 2 %
HCT: 31.2 % — ABNORMAL LOW (ref 39.0–52.0)
Hemoglobin: 9.4 g/dL — ABNORMAL LOW (ref 13.0–17.0)
Immature Granulocytes: 1 %
Lymphocytes Relative: 8 %
Lymphs Abs: 0.9 10*3/uL (ref 0.7–4.0)
MCH: 19 pg — ABNORMAL LOW (ref 26.0–34.0)
MCHC: 30.1 g/dL (ref 30.0–36.0)
MCV: 63.2 fL — ABNORMAL LOW (ref 80.0–100.0)
Monocytes Absolute: 0.4 10*3/uL (ref 0.1–1.0)
Monocytes Relative: 4 %
Neutro Abs: 8.7 10*3/uL — ABNORMAL HIGH (ref 1.7–7.7)
Neutrophils Relative %: 85 %
Platelet Count: 193 10*3/uL (ref 150–400)
RBC: 4.94 MIL/uL (ref 4.22–5.81)
RDW: 18.4 % — ABNORMAL HIGH (ref 11.5–15.5)
WBC Count: 10.2 10*3/uL (ref 4.0–10.5)
nRBC: 0 % (ref 0.0–0.2)

## 2024-02-28 MED ORDER — EPOETIN ALFA-EPBX 40000 UNIT/ML IJ SOLN
40000.0000 [IU] | Freq: Once | INTRAMUSCULAR | Status: AC
  Administered 2024-02-28: 40000 [IU] via SUBCUTANEOUS
  Filled 2024-02-28: qty 1

## 2024-02-28 MED ORDER — CYANOCOBALAMIN 1000 MCG/ML IJ SOLN
1000.0000 ug | Freq: Once | INTRAMUSCULAR | Status: AC
  Administered 2024-02-28: 1000 ug via INTRAMUSCULAR
  Filled 2024-02-28: qty 1

## 2024-02-28 MED ORDER — POTASSIUM CHLORIDE CRYS ER 20 MEQ PO TBCR: 20.0000 meq | EXTENDED_RELEASE_TABLET | Freq: Every day | ORAL | 3 refills | Status: AC

## 2024-02-28 NOTE — Progress Notes (Signed)
 Patient Care Team: [name], MD as PCP - General (Family Medicine) [name], MD as PCP - Cardiology (Cardiology) [name], MD as PCP - Electrophysiology (Cardiology) [name], MD as Consulting Physician (Hematology and Oncology)  DIAGNOSIS:  Encounter Diagnosis  Name Primary?   Anemia of chronic disease Yes      CHIEF COMPLIANT: Follow-up of anemia of chronic disease  HISTORY OF PRESENT ILLNESS: [name] has anemia of chronic kidney disease who has been on Retacrit  injections and appears to be tolerating it very well.  He is due for injection today.  He has chronic fatigue.  He is also working with his cardiologist because he has elevated BNP.  ALLERGIES:  has no known allergies.  MEDICATIONS:  Current Outpatient Medications  Medication Sig Dispense Refill   cholestyramine (QUESTRAN) 4 g packet Take 4 g by mouth daily.     CREON 24000-76000 units CPEP as directed Orally three times a day     ELIQUIS  2.5 MG TABS tablet TAKE 1 TABLET(2.5 MG) BY MOUTH TWICE DAILY 180 tablet 1   empagliflozin  (JARDIANCE ) 10 MG TABS tablet TAKE 1 TABLET(10 MG) BY MOUTH DAILY 90 tablet 3   epoetin  alfa-epbx (RETACRIT ) 40000 UNIT/ML injection Inject 40,000 Units into the skin every 6 (six) weeks. As needed     finasteride  (PROSCAR ) 5 MG tablet Take 5 mg by mouth daily.     fluticasone (FLONASE) 50 MCG/ACT nasal spray Place 1 spray into both nostrils daily as needed for allergies or rhinitis.     ketotifen  (ZADITOR ) 0.025 % ophthalmic solution Place 1 drop into both eyes 2 (two) times daily as needed (itchy eyes).     levothyroxine  (SYNTHROID ) 88 MCG tablet Take 88 mcg by mouth every morning.     pantoprazole  (PROTONIX ) 40 MG tablet Take 1 tablet (40 mg total) by mouth 2 (two) times daily before a meal for 30 days, THEN 1 tablet (40 mg total) daily. 120 tablet 0   predniSONE  (DELTASONE ) 5 MG tablet Take 5 mg by mouth daily with breakfast. TAKING 15mg  QD FOR 3Wks      torsemide  (DEMADEX ) 20 MG tablet TAKE 2 TABLETS BY MOUTH EVERY MORNING AND 1 TABLET IN THE EVENING 270 tablet 3   potassium chloride  SA (KLOR-CON  M) 20 MEQ tablet Take 1 tablet (20 mEq total) by mouth daily. 90 tablet 3   No current facility-administered medications for this visit.    PHYSICAL EXAMINATION: ECOG PERFORMANCE STATUS: 1 - Symptomatic but completely ambulatory  Vitals:   02/28/24 0900  BP: (!) 140/71  Pulse: 88  Resp: 17  Temp: 97.7 F (36.5 C)  SpO2: 100%   Filed Weights   02/28/24 0900  Weight: 115 lb 3.2 oz (52.3 kg)    Physical Exam   (exam performed in the presence of a chaperone)  LABORATORY DATA:  I have reviewed the data as listed    Latest Ref Rng & Units 02/28/2024    8:04 AM 02/20/2024    8:35 AM 02/13/2024    8:48 AM  CMP  Glucose 70 - 99 mg/dL 888  897  892   BUN 8 - 23 mg/dL 23  21  23    Creatinine 0.61 - 1.24 mg/dL 8.11  8.18  7.85   Sodium 135 - 145 mmol/L 140  141  145   Potassium 3.5 - 5.1 mmol/L 3.9  4.2  4.2   Chloride 98 - 111 mmol/L 101  102  101   CO2  22 - 32 mmol/L 30  22  23    Calcium 8.9 - 10.3 mg/dL 9.3  9.1  9.2   Total Protein 6.5 - 8.1 g/dL 6.6     Total Bilirubin 0.0 - 1.2 mg/dL 0.5     Alkaline Phos 38 - 126 U/L 53     AST 15 - 41 U/L 13     ALT 0 - 44 U/L 7       Lab Results  Component Value Date   WBC 10.2 02/28/2024   HGB 9.4 (L) 02/28/2024   HCT 31.2 (L) 02/28/2024   MCV 63.2 (L) 02/28/2024   PLT 193 02/28/2024   NEUTROABS 8.7 (H) 02/28/2024    ASSESSMENT & PLAN:  Anemia of chronic disease Hemoglobin electrophoresis: Beta thalassemia minor.    Hospitalization 09/22/2019-09/25/2019: A. fib with RVR status post cardioversion, currently on Eliquis  Hospitalization 02/28/2022-03/11/2022: Atrial fibrillation: Cardioversion planned for 03/22/2022. 05/12/22: Ablation and Pacemaker Implantation  06/12/2023: EGD with Botox    Lab review: 09/25/2019: WBC 7.2, hemoglobin 9.6, platelets 145 04/14/22: Hb 7.8, Platelets 91  (PRBC) 06/14/22: Hb 10.4, Pl 151, Cr 1.66, Iron  sat: 16%, Ferritin 287 07/05/22: Hemoglobin 9.7 08/14/22: Hemoglobin 7.4 09/26/2022: Hemoglobin 9.1 (Retacrit  injection today) 04/12/2023: Hemoglobin 10.4 (Retacrit  and starting B12 injections) 05/24/2023: Hemoglobin 9.9 (Retacrit , B12 and IV iron ) 07/05/2023: Hemoglobin 10.8 08/08/2023: Hemoglobin 11.2 09/21/2023: Hemoglobin 11.1 (Retacrit  held) 10/11/2023: Hemoglobin 10.7 12/27/2023: Hemoglobin 10.1 (getting B12 and Retacrit ) 02/28/2024: Hemoglobin 9.4   IV iron : March 2024, September 2024   Current treatment:  Retacrit  and B12 injections given today     He will come back again in 3 weeks for lab check and in 9 weeks for follow-up with me.      No orders of the defined types were placed in this encounter.  The patient has a good understanding of the overall plan. he agrees with it. he will call with any problems that may develop before the next visit here. Total time spent: 30 mins including face to face time and time spent for planning, charting and co-ordination of care   Naomi MARLA Chad, MD 02/28/24

## 2024-03-13 ENCOUNTER — Ambulatory Visit: Payer: Medicare PPO

## 2024-03-13 ENCOUNTER — Ambulatory Visit: Payer: Self-pay | Admitting: Internal Medicine

## 2024-03-13 DIAGNOSIS — I480 Paroxysmal atrial fibrillation: Secondary | ICD-10-CM | POA: Diagnosis not present

## 2024-03-13 LAB — CUP PACEART REMOTE DEVICE CHECK
Battery Remaining Longevity: 132 mo
Battery Voltage: 3.02 V
Brady Statistic AP VP Percent: 65.35 %
Brady Statistic AP VS Percent: 0 %
Brady Statistic AS VP Percent: 34.18 %
Brady Statistic AS VS Percent: 0.37 %
Brady Statistic RA Percent Paced: 13.45 %
Brady Statistic RV Percent Paced: 99.6 %
Date Time Interrogation Session: 20250709200050
Implantable Lead Connection Status: 753985
Implantable Lead Connection Status: 753985
Implantable Lead Implant Date: 20230908
Implantable Lead Implant Date: 20230908
Implantable Lead Location: 753859
Implantable Lead Location: 753860
Implantable Lead Model: 3830
Implantable Lead Model: 5076
Implantable Pulse Generator Implant Date: 20230908
Lead Channel Impedance Value: 285 Ohm
Lead Channel Impedance Value: 323 Ohm
Lead Channel Impedance Value: 456 Ohm
Lead Channel Impedance Value: 475 Ohm
Lead Channel Pacing Threshold Amplitude: 0.75 V
Lead Channel Pacing Threshold Amplitude: 0.875 V
Lead Channel Pacing Threshold Pulse Width: 0.4 ms
Lead Channel Pacing Threshold Pulse Width: 0.4 ms
Lead Channel Sensing Intrinsic Amplitude: 17.75 mV
Lead Channel Sensing Intrinsic Amplitude: 17.75 mV
Lead Channel Sensing Intrinsic Amplitude: 2 mV
Lead Channel Sensing Intrinsic Amplitude: 2 mV
Lead Channel Setting Pacing Amplitude: 2 V
Lead Channel Setting Pacing Amplitude: 2.75 V
Lead Channel Setting Pacing Pulse Width: 0.4 ms
Lead Channel Setting Sensing Sensitivity: 1.2 mV
Zone Setting Status: 755011

## 2024-03-20 ENCOUNTER — Inpatient Hospital Stay

## 2024-03-20 ENCOUNTER — Inpatient Hospital Stay: Attending: Hematology and Oncology

## 2024-03-20 VITALS — BP 133/70 | HR 85 | Temp 98.0°F | Resp 16

## 2024-03-20 DIAGNOSIS — E538 Deficiency of other specified B group vitamins: Secondary | ICD-10-CM | POA: Insufficient documentation

## 2024-03-20 DIAGNOSIS — D563 Thalassemia minor: Secondary | ICD-10-CM | POA: Diagnosis not present

## 2024-03-20 DIAGNOSIS — D696 Thrombocytopenia, unspecified: Secondary | ICD-10-CM

## 2024-03-20 DIAGNOSIS — D638 Anemia in other chronic diseases classified elsewhere: Secondary | ICD-10-CM | POA: Insufficient documentation

## 2024-03-20 LAB — CMP (CANCER CENTER ONLY)
ALT: 7 U/L (ref 0–44)
AST: 13 U/L — ABNORMAL LOW (ref 15–41)
Albumin: 4 g/dL (ref 3.5–5.0)
Alkaline Phosphatase: 53 U/L (ref 38–126)
Anion gap: 9 (ref 5–15)
BUN: 22 mg/dL (ref 8–23)
CO2: 27 mmol/L (ref 22–32)
Calcium: 8.9 mg/dL (ref 8.9–10.3)
Chloride: 103 mmol/L (ref 98–111)
Creatinine: 1.66 mg/dL — ABNORMAL HIGH (ref 0.61–1.24)
GFR, Estimated: 40 mL/min — ABNORMAL LOW (ref >60)
Glucose, Bld: 109 mg/dL — ABNORMAL HIGH (ref 70–99)
Potassium: 3.5 mmol/L (ref 3.5–5.1)
Sodium: 139 mmol/L (ref 135–145)
Total Bilirubin: 0.6 mg/dL (ref 0.0–1.2)
Total Protein: 6.3 g/dL — ABNORMAL LOW (ref 6.5–8.1)

## 2024-03-20 LAB — CBC WITH DIFFERENTIAL (CANCER CENTER ONLY)
Abs Immature Granulocytes: 0.09 K/uL — ABNORMAL HIGH (ref 0.00–0.07)
Basophils Absolute: 0 K/uL (ref 0.0–0.1)
Basophils Relative: 0 %
Eosinophils Absolute: 0.1 K/uL (ref 0.0–0.5)
Eosinophils Relative: 1 %
HCT: 29.2 % — ABNORMAL LOW (ref 39.0–52.0)
Hemoglobin: 8.8 g/dL — ABNORMAL LOW (ref 13.0–17.0)
Immature Granulocytes: 1 %
Lymphocytes Relative: 6 %
Lymphs Abs: 0.7 K/uL (ref 0.7–4.0)
MCH: 18.6 pg — ABNORMAL LOW (ref 26.0–34.0)
MCHC: 30.1 g/dL (ref 30.0–36.0)
MCV: 61.6 fL — ABNORMAL LOW (ref 80.0–100.0)
Monocytes Absolute: 0.7 K/uL (ref 0.1–1.0)
Monocytes Relative: 6 %
Neutro Abs: 10 K/uL — ABNORMAL HIGH (ref 1.7–7.7)
Neutrophils Relative %: 86 %
Platelet Count: 186 K/uL (ref 150–400)
RBC: 4.74 MIL/uL (ref 4.22–5.81)
RDW: 18.8 % — ABNORMAL HIGH (ref 11.5–15.5)
WBC Count: 11.5 K/uL — ABNORMAL HIGH (ref 4.0–10.5)
nRBC: 0 % (ref 0.0–0.2)

## 2024-03-20 MED ORDER — CYANOCOBALAMIN 1000 MCG/ML IJ SOLN
1000.0000 ug | Freq: Once | INTRAMUSCULAR | Status: AC
  Administered 2024-03-20: 1000 ug via INTRAMUSCULAR
  Filled 2024-03-20: qty 1

## 2024-03-20 MED ORDER — EPOETIN ALFA-EPBX 40000 UNIT/ML IJ SOLN
40000.0000 [IU] | Freq: Once | INTRAMUSCULAR | Status: AC
  Administered 2024-03-20: 40000 [IU] via SUBCUTANEOUS
  Filled 2024-03-20: qty 1

## 2024-04-13 NOTE — Assessment & Plan Note (Signed)
 Hemoglobin electrophoresis: Beta thalassemia minor.    Hospitalization 09/22/2019-09/25/2019: A. fib with RVR status post cardioversion, currently on Eliquis  Hospitalization 02/28/2022-03/11/2022: Atrial fibrillation: Cardioversion planned for 03/22/2022. 05/12/22: Ablation and Pacemaker Implantation  06/12/2023: EGD with Botox    Lab review: 09/25/2019: WBC 7.2, hemoglobin 9.6, platelets 145 04/14/22: Hb 7.8, Platelets 91 (PRBC) 06/14/22: Hb 10.4, Pl 151, Cr 1.66, Iron  sat: 16%, Ferritin 287 07/05/22: Hemoglobin 9.7 08/14/22: Hemoglobin 7.4 09/26/2022: Hemoglobin 9.1 (Retacrit  injection today) 04/12/2023: Hemoglobin 10.4 (Retacrit  and starting B12 injections) 05/24/2023: Hemoglobin 9.9 (Retacrit , B12 and IV iron ) 07/05/2023: Hemoglobin 10.8 08/08/2023: Hemoglobin 11.2 09/21/2023: Hemoglobin 11.1 (Retacrit  held) 10/11/2023: Hemoglobin 10.7 12/27/2023: Hemoglobin 10.1 (getting B12 and Retacrit ) 02/28/2024: Hemoglobin 9.4   IV iron : March 2024, September 2024   Current treatment:  Retacrit  and B12 injections given today      He will come back again in 3 weeks for lab check and in 9 weeks for follow-up with me.

## 2024-04-14 ENCOUNTER — Inpatient Hospital Stay

## 2024-04-14 ENCOUNTER — Inpatient Hospital Stay: Attending: Hematology and Oncology | Admitting: Hematology and Oncology

## 2024-04-14 ENCOUNTER — Other Ambulatory Visit: Payer: Self-pay

## 2024-04-14 ENCOUNTER — Inpatient Hospital Stay: Attending: Hematology and Oncology

## 2024-04-14 VITALS — BP 120/78 | HR 81 | Temp 98.2°F | Resp 18 | Ht 62.0 in | Wt 118.9 lb

## 2024-04-14 DIAGNOSIS — D649 Anemia, unspecified: Secondary | ICD-10-CM | POA: Diagnosis not present

## 2024-04-14 DIAGNOSIS — D638 Anemia in other chronic diseases classified elsewhere: Secondary | ICD-10-CM | POA: Diagnosis not present

## 2024-04-14 DIAGNOSIS — D563 Thalassemia minor: Secondary | ICD-10-CM | POA: Insufficient documentation

## 2024-04-14 DIAGNOSIS — I4891 Unspecified atrial fibrillation: Secondary | ICD-10-CM | POA: Insufficient documentation

## 2024-04-14 DIAGNOSIS — D598 Other acquired hemolytic anemias: Secondary | ICD-10-CM

## 2024-04-14 DIAGNOSIS — D696 Thrombocytopenia, unspecified: Secondary | ICD-10-CM

## 2024-04-14 LAB — CBC WITH DIFFERENTIAL (CANCER CENTER ONLY)
Abs Immature Granulocytes: 0.05 K/uL (ref 0.00–0.07)
Basophils Absolute: 0 K/uL (ref 0.0–0.1)
Basophils Relative: 0 %
Eosinophils Absolute: 0.2 K/uL (ref 0.0–0.5)
Eosinophils Relative: 2 %
HCT: 26.7 % — ABNORMAL LOW (ref 39.0–52.0)
Hemoglobin: 8.1 g/dL — ABNORMAL LOW (ref 13.0–17.0)
Immature Granulocytes: 1 %
Lymphocytes Relative: 6 %
Lymphs Abs: 0.6 K/uL — ABNORMAL LOW (ref 0.7–4.0)
MCH: 18.3 pg — ABNORMAL LOW (ref 26.0–34.0)
MCHC: 30.3 g/dL (ref 30.0–36.0)
MCV: 60.4 fL — ABNORMAL LOW (ref 80.0–100.0)
Monocytes Absolute: 0.6 K/uL (ref 0.1–1.0)
Monocytes Relative: 6 %
Neutro Abs: 9.1 K/uL — ABNORMAL HIGH (ref 1.7–7.7)
Neutrophils Relative %: 85 %
Platelet Count: 204 K/uL (ref 150–400)
RBC: 4.42 MIL/uL (ref 4.22–5.81)
RDW: 19.4 % — ABNORMAL HIGH (ref 11.5–15.5)
WBC Count: 10.5 K/uL (ref 4.0–10.5)
nRBC: 0.3 % — ABNORMAL HIGH (ref 0.0–0.2)

## 2024-04-14 LAB — CMP (CANCER CENTER ONLY)
ALT: 10 U/L (ref 0–44)
AST: 13 U/L — ABNORMAL LOW (ref 15–41)
Albumin: 4.1 g/dL (ref 3.5–5.0)
Alkaline Phosphatase: 48 U/L (ref 38–126)
Anion gap: 8 (ref 5–15)
BUN: 26 mg/dL — ABNORMAL HIGH (ref 8–23)
CO2: 29 mmol/L (ref 22–32)
Calcium: 8.9 mg/dL (ref 8.9–10.3)
Chloride: 102 mmol/L (ref 98–111)
Creatinine: 1.7 mg/dL — ABNORMAL HIGH (ref 0.61–1.24)
GFR, Estimated: 39 mL/min — ABNORMAL LOW (ref >60)
Glucose, Bld: 119 mg/dL — ABNORMAL HIGH (ref 70–99)
Potassium: 3.4 mmol/L — ABNORMAL LOW (ref 3.5–5.1)
Sodium: 139 mmol/L (ref 135–145)
Total Bilirubin: 0.7 mg/dL (ref 0.0–1.2)
Total Protein: 6.2 g/dL — ABNORMAL LOW (ref 6.5–8.1)

## 2024-04-14 LAB — IRON AND IRON BINDING CAPACITY (CC-WL,HP ONLY)
Iron: 31 ug/dL — ABNORMAL LOW (ref 45–182)
Saturation Ratios: 8 % — ABNORMAL LOW (ref 17.9–39.5)
TIBC: 414 ug/dL (ref 250–450)
UIBC: 383 ug/dL — ABNORMAL HIGH (ref 117–376)

## 2024-04-14 LAB — SAMPLE TO BLOOD BANK

## 2024-04-14 LAB — FERRITIN: Ferritin: 22 ng/mL — ABNORMAL LOW (ref 24–336)

## 2024-04-14 MED ORDER — CYANOCOBALAMIN 1000 MCG/ML IJ SOLN
1000.0000 ug | Freq: Once | INTRAMUSCULAR | Status: AC
  Administered 2024-04-14 (×2): 1000 ug via INTRAMUSCULAR
  Filled 2024-04-14: qty 1

## 2024-04-14 MED ORDER — EPOETIN ALFA-EPBX 40000 UNIT/ML IJ SOLN
40000.0000 [IU] | Freq: Once | INTRAMUSCULAR | Status: AC
  Administered 2024-04-14 (×2): 40000 [IU] via SUBCUTANEOUS
  Filled 2024-04-14: qty 1

## 2024-04-14 NOTE — Progress Notes (Signed)
 Patient Care Team: Joshua Charleston, MD as PCP - General (Family Medicine) Joshua Wilbert SAUNDERS, MD as PCP - Cardiology (Cardiology) Joshua Danelle ORN, MD as PCP - Electrophysiology (Cardiology) Joshua Potts, MD as Consulting Physician (Hematology and Oncology)  DIAGNOSIS:  Encounter Diagnosis  Name Primary?   Anemia of chronic disease Yes     CHIEF COMPLIANT: Worsening anemia  HISTORY OF PRESENT ILLNESS:   History of Present Illness 8/11/2020Bennie R Joshua Hudson is an 89 year old male who presents for evaluation of anemia.  His hemoglobin level is 8.1 g/dL. He previously received iron  supplementation, which was beneficial for a period. Currently, he is not on any iron  supplementation or receiving blood transfusions.  He complains of feeling dizzy lightheaded weak and shortness of breath.     ALLERGIES:  has no known allergies.  MEDICATIONS:  Current Outpatient Medications  Medication Sig Dispense Refill   cholestyramine (QUESTRAN) 4 g packet Take 4 g by mouth daily.     CREON 24000-76000 units CPEP as directed Orally three times a day     ELIQUIS  2.5 MG TABS tablet TAKE 1 TABLET(2.5 MG) BY MOUTH TWICE DAILY 180 tablet 1   empagliflozin  (JARDIANCE ) 10 MG TABS tablet TAKE 1 TABLET(10 MG) BY MOUTH DAILY 90 tablet 3   epoetin  alfa-epbx (RETACRIT ) 40000 UNIT/ML injection Inject 40,000 Units into the skin every 6 (six) weeks. As needed     finasteride  (PROSCAR ) 5 MG tablet Take 5 mg by mouth daily.     fluticasone (FLONASE) 50 MCG/ACT nasal spray Place 1 spray into both nostrils daily as needed for allergies or rhinitis.     ketotifen  (ZADITOR ) 0.025 % ophthalmic solution Place 1 drop into both eyes 2 (two) times daily as needed (itchy eyes).     levothyroxine  (SYNTHROID ) 88 MCG tablet Take 88 mcg by mouth every morning.     pantoprazole  (PROTONIX ) 40 MG tablet Take 1 tablet (40 mg total) by mouth 2 (two) times daily before a meal for 30 days, THEN 1 tablet (40 mg total) daily. 120  tablet 0   potassium chloride  SA (KLOR-CON  M) 20 MEQ tablet Take 1 tablet (20 mEq total) by mouth daily. 90 tablet 3   predniSONE  (DELTASONE ) 5 MG tablet Take 5 mg by mouth daily with breakfast. TAKING 15mg  QD FOR 3Wks     torsemide  (DEMADEX ) 20 MG tablet TAKE 2 TABLETS BY MOUTH EVERY MORNING AND 1 TABLET IN THE EVENING 270 tablet 3   No current facility-administered medications for this visit.    PHYSICAL EXAMINATION: ECOG PERFORMANCE STATUS: 1 - Symptomatic but completely ambulatory  Vitals:   04/14/24 0812  BP: 120/78  Pulse: 81  Resp: 18  Temp: 98.2 F (36.8 C)  SpO2: 100%   Filed Weights   04/14/24 0812  Weight: 118 lb 14.4 oz (53.9 kg)    Physical Exam Feeling dizzy and lightheaded     (exam performed in the presence of a chaperone)  LABORATORY DATA:  I have reviewed the data as listed    Latest Ref Rng & Units 03/20/2024    8:01 AM 02/28/2024    8:04 AM 02/20/2024    8:35 AM  CMP  Glucose 70 - 99 mg/dL 890  888  897   BUN 8 - 23 mg/dL 22  23  21    Creatinine 0.61 - 1.24 mg/dL 8.33  8.11  8.18   Sodium 135 - 145 mmol/L 139  140  141   Potassium 3.5 - 5.1 mmol/L 3.5  3.9  4.2   Chloride 98 - 111 mmol/L 103  101  102   CO2 22 - 32 mmol/L 27  30  22    Calcium 8.9 - 10.3 mg/dL 8.9  9.3  9.1   Total Protein 6.5 - 8.1 g/dL 6.3  6.6    Total Bilirubin 0.0 - 1.2 mg/dL 0.6  0.5    Alkaline Phos 38 - 126 U/L 53  53    AST 15 - 41 U/L 13  13    ALT 0 - 44 U/L 7  7      Lab Results  Component Value Date   WBC 10.5 04/14/2024   HGB 8.1 (L) 04/14/2024   HCT 26.7 (L) 04/14/2024   MCV 60.4 (L) 04/14/2024   PLT 204 04/14/2024   NEUTROABS 9.1 (H) 04/14/2024    ASSESSMENT & PLAN:  Anemia of chronic disease Hemoglobin electrophoresis: Beta thalassemia minor.    Hospitalization 09/22/2019-09/25/2019: A. fib with RVR status post cardioversion, currently on Eliquis  Hospitalization 02/28/2022-03/11/2022: Atrial fibrillation: Cardioversion planned for 03/22/2022. 05/12/22:  Ablation and Pacemaker Implantation  06/12/2023: EGD with Botox    Lab review: 09/25/2019: WBC 7.2, hemoglobin 9.6, platelets 145 04/14/22: Hb 7.8, Platelets 91 (PRBC) 06/14/22: Hb 10.4, Pl 151, Cr 1.66, Iron  sat: 16%, Ferritin 287 07/05/22: Hemoglobin 9.7 08/14/22: Hemoglobin 7.4 09/26/2022: Hemoglobin 9.1 (Retacrit  injection today) 04/12/2023: Hemoglobin 10.4 (Retacrit  and starting B12 injections) 05/24/2023: Hemoglobin 9.9 (Retacrit , B12 and IV iron ) 07/05/2023: Hemoglobin 10.8 08/08/2023: Hemoglobin 11.2 09/21/2023: Hemoglobin 11.1 (Retacrit  held) 10/11/2023: Hemoglobin 10.7 12/27/2023: Hemoglobin 10.1 (getting B12 and Retacrit ) 02/28/2024: Hemoglobin 9.4 04/14/2024: Hemoglobin 8.1 (1 unit PRBC)   IV iron : March 2024, September 2024   Current treatment:  1 unit of PRBC will be requested. Transfuse IV iron : Will set this up at [facility] labs in 3 weeks      He will come back again in 3 weeks for lab check and in 9 weeks for follow-up with me.   Assessment & Plan Anemia of chronic disease Hemoglobin at 8.1 indicates significant anemia with risk of dizziness and falls. Previous iron  treatment was beneficial. Blood transfusion prioritized to prevent symptoms, followed by iron  infusion for long-term support. - Order type and cross for blood transfusion. - Administer one unit of blood if available today; if not, plan for Wednesday. - Arrange iron  infusion at [facility] within the next week. - If blood transfusion is not possible today, administer epoetin  alfa-epbx injection and plan for blood transfusion on Wednesday.      Orders Placed This Encounter  Procedures   Ferritin    Standing Status:   Future    Expiration Date:   04/14/2025   Iron  and Iron  Binding Capacity ([facility] only)    Standing Status:   Future    Expiration Date:   04/14/2025   Sample to Blood Bank    Standing Status:   Future    Expected Date:   04/14/2024    Expiration Date:   04/14/2025    The patient has a good understanding of the overall plan. he agrees with it. he will call with any problems that may develop before the next visit here. Total time spent: 30 mins including face to face time and time spent for planning, charting and co-ordination of care   Joshua MARLA Chad, MD 04/14/24

## 2024-04-14 NOTE — Progress Notes (Signed)
 Orders placed for 1 unit PRBC per Dr Odean verbal order. Orders also placed for pre-meds Tylenol  650 mg po once and benadryl  25 mg po once. Released type and screen and prepare for 04/15/24 for 04/16/24 transfusion. S/w Garrel in BB.

## 2024-04-15 DIAGNOSIS — H353211 Exudative age-related macular degeneration, right eye, with active choroidal neovascularization: Secondary | ICD-10-CM | POA: Diagnosis not present

## 2024-04-16 ENCOUNTER — Inpatient Hospital Stay

## 2024-04-16 DIAGNOSIS — D696 Thrombocytopenia, unspecified: Secondary | ICD-10-CM

## 2024-04-16 DIAGNOSIS — D598 Other acquired hemolytic anemias: Secondary | ICD-10-CM

## 2024-04-16 DIAGNOSIS — I4891 Unspecified atrial fibrillation: Secondary | ICD-10-CM | POA: Diagnosis not present

## 2024-04-16 DIAGNOSIS — D563 Thalassemia minor: Secondary | ICD-10-CM | POA: Diagnosis not present

## 2024-04-16 DIAGNOSIS — D638 Anemia in other chronic diseases classified elsewhere: Secondary | ICD-10-CM | POA: Diagnosis not present

## 2024-04-16 MED ORDER — SODIUM CHLORIDE 0.9% FLUSH: 10.0000 mL | INTRAVENOUS | Status: DC | PRN

## 2024-04-16 MED ORDER — SODIUM CHLORIDE 0.9% FLUSH: 3.0000 mL | INTRAVENOUS | Status: DC | PRN

## 2024-04-16 MED ORDER — HEPARIN SOD (PORK) LOCK FLUSH 100 UNIT/ML IV SOLN: 250.0000 [IU] | INTRAVENOUS | Status: DC | PRN

## 2024-04-16 MED ORDER — SODIUM CHLORIDE 0.9% IV SOLUTION: 250.0000 mL | INTRAVENOUS | Status: DC

## 2024-04-16 MED ORDER — ACETAMINOPHEN 325 MG PO TABS
650.0000 mg | ORAL_TABLET | Freq: Once | ORAL | Status: AC
  Administered 2024-04-16 (×2): 650 mg via ORAL
  Filled 2024-04-16: qty 2

## 2024-04-16 MED ORDER — DIPHENHYDRAMINE HCL 25 MG PO CAPS
25.0000 mg | ORAL_CAPSULE | Freq: Once | ORAL | Status: AC
  Administered 2024-04-16 (×2): 25 mg via ORAL
  Filled 2024-04-16: qty 1

## 2024-04-16 NOTE — Patient Instructions (Signed)

## 2024-04-17 ENCOUNTER — Other Ambulatory Visit: Payer: Self-pay | Admitting: Hematology and Oncology

## 2024-04-17 ENCOUNTER — Telehealth: Payer: Self-pay

## 2024-04-17 LAB — TYPE AND SCREEN
ABO/RH(D): A POS
Antibody Screen: NEGATIVE
Unit division: 0

## 2024-04-17 LAB — BPAM RBC
Blood Product Expiration Date: 202509112359
ISSUE DATE / TIME: 202508130902
Unit Type and Rh: 6200

## 2024-04-17 NOTE — Telephone Encounter (Signed)
 Dr. Gudena, patient will be scheduled as soon as possible.  Auth Submission: NO AUTH NEEDED Site of care: Site of care: [facility] Payer: [employer] medicare Medication & CPT/J Code(s) submitted: Venofer  (Iron  Sucrose) J1756 Diagnosis Code:  Route of submission (phone, fax, portal):  Phone # Fax # Auth type: Buy/Bill PB Units/visits requested: 200mg  x 5 doses Reference number:  Approval from: 04/17/24 to 08/17/24

## 2024-04-21 ENCOUNTER — Other Ambulatory Visit (HOSPITAL_COMMUNITY): Payer: Self-pay | Admitting: Hematology and Oncology

## 2024-04-21 ENCOUNTER — Encounter: Payer: Self-pay | Admitting: Hematology and Oncology

## 2024-04-21 DIAGNOSIS — R3912 Poor urinary stream: Secondary | ICD-10-CM | POA: Diagnosis not present

## 2024-04-21 DIAGNOSIS — D638 Anemia in other chronic diseases classified elsewhere: Secondary | ICD-10-CM

## 2024-04-21 DIAGNOSIS — D509 Iron deficiency anemia, unspecified: Secondary | ICD-10-CM

## 2024-04-21 DIAGNOSIS — N401 Enlarged prostate with lower urinary tract symptoms: Secondary | ICD-10-CM | POA: Diagnosis not present

## 2024-04-23 ENCOUNTER — Encounter (HOSPITAL_COMMUNITY)
Admission: RE | Admit: 2024-04-23 | Discharge: 2024-04-23 | Disposition: A | Discharge status: Home / Self Care | Source: Ambulatory Visit | Attending: Hematology and Oncology | Admitting: Hematology and Oncology

## 2024-04-23 ENCOUNTER — Ambulatory Visit

## 2024-04-23 ENCOUNTER — Other Ambulatory Visit: Payer: Self-pay | Admitting: Internal Medicine

## 2024-04-23 DIAGNOSIS — D509 Iron deficiency anemia, unspecified: Secondary | ICD-10-CM | POA: Insufficient documentation

## 2024-04-23 DIAGNOSIS — I480 Paroxysmal atrial fibrillation: Secondary | ICD-10-CM

## 2024-04-23 DIAGNOSIS — D638 Anemia in other chronic diseases classified elsewhere: Secondary | ICD-10-CM

## 2024-04-23 MED ORDER — IRON SUCROSE 200 MG IVPB - SIMPLE MED
200.0000 mg | Status: DC
  Administered 2024-04-23: 200 mg via INTRAVENOUS
  Filled 2024-04-23: qty 200

## 2024-04-23 NOTE — Telephone Encounter (Signed)
 Prescription refill request for Eliquis  received. Indication: PAF Last office visit: 01/18/24  ONEIDA Bihari MD Scr: 1.70 on 04/14/24  Epic Age: 89 Weight: 51.5kg  Based on above findings Eliquis  2.5mg  twice daily is the appropriate dose.  Refill approved.

## 2024-04-24 ENCOUNTER — Encounter (HOSPITAL_COMMUNITY): Payer: Self-pay

## 2024-04-24 ENCOUNTER — Other Ambulatory Visit: Payer: Self-pay

## 2024-04-24 ENCOUNTER — Emergency Department (HOSPITAL_COMMUNITY)

## 2024-04-24 ENCOUNTER — Emergency Department (HOSPITAL_COMMUNITY)
Admission: EM | Admit: 2024-04-24 | Discharge: 2024-04-24 | Disposition: A | Discharge status: Home / Self Care | Attending: Emergency Medicine | Admitting: Emergency Medicine

## 2024-04-24 ENCOUNTER — Other Ambulatory Visit (HOSPITAL_COMMUNITY): Payer: Self-pay

## 2024-04-24 DIAGNOSIS — Z7901 Long term (current) use of anticoagulants: Secondary | ICD-10-CM | POA: Insufficient documentation

## 2024-04-24 DIAGNOSIS — I509 Heart failure, unspecified: Secondary | ICD-10-CM | POA: Diagnosis not present

## 2024-04-24 DIAGNOSIS — R609 Edema, unspecified: Secondary | ICD-10-CM

## 2024-04-24 DIAGNOSIS — L03116 Cellulitis of left lower limb: Secondary | ICD-10-CM | POA: Insufficient documentation

## 2024-04-24 DIAGNOSIS — M79672 Pain in left foot: Secondary | ICD-10-CM | POA: Diagnosis not present

## 2024-04-24 DIAGNOSIS — Z87891 Personal history of nicotine dependence: Secondary | ICD-10-CM | POA: Diagnosis not present

## 2024-04-24 DIAGNOSIS — M7989 Other specified soft tissue disorders: Secondary | ICD-10-CM | POA: Diagnosis not present

## 2024-04-24 MED ORDER — OXYCODONE-ACETAMINOPHEN 5-325 MG PO TABS: 1.0000 | ORAL_TABLET | ORAL | Status: DC | PRN

## 2024-04-24 MED ORDER — DEXTROSE 5 % IV SOLN
1125.0000 mg | Freq: Once | INTRAVENOUS | Status: AC
  Administered 2024-04-24: 1125 mg via INTRAVENOUS
  Filled 2024-04-24: qty 56.25

## 2024-04-24 MED ORDER — ACETAMINOPHEN 500 MG PO TABS
1000.0000 mg | ORAL_TABLET | Freq: Once | ORAL | Status: AC
  Administered 2024-04-24: 1000 mg via ORAL
  Filled 2024-04-24: qty 2

## 2024-04-24 NOTE — Discharge Instructions (Signed)
 Take Tylenol  at home as needed for pain.  Follow-up with primary care for further evaluation of the foot if pain continues after redness and swelling has gone down.  If new symptoms present, redness and swelling gets worse, or pain worsens return to ED for further evaluation.

## 2024-04-24 NOTE — ED Provider Notes (Signed)
 [facility] Provider Note   CSN: [id number] Arrival date & time: 04/24/24  9363     Patient presents with: Foot Pain   [facility] is a 89 y.o. male.  89 year old male presents to the ED with complaints of left swelling and foot pain x 3 days.  Significant history includes A-fib, congestive heart failure, rheumatoid arthritis, kidney failure.  Patient reports insidious onset with pain occurring when he woke up in the morning.  Since onset the left foot has become more red and painful.  Today he has not been able to walk on it at all.  Patient denies any injuries or wounds to the site.  Patient denies any history of gout or neuropathy.  Patient still has feeling and is able to move his foot.  Patient has significant pharmacological history of Eliquis , Lasix  and prednisone .   Prior to Admission medications   Medication Sig Start Date End Date Taking? Authorizing Provider  cholestyramine (QUESTRAN) 4 g packet Take 4 g by mouth daily. 05/02/22   [provider]  CREON 24000-76000 units CPEP as directed Orally three times a day 10/09/22   [provider]  ELIQUIS  2.5 MG TABS tablet TAKE 1 TABLET(2.5 MG) BY MOUTH TWICE DAILY 04/23/24   Shlomo Wilbert JONELLE, MD  empagliflozin  (JARDIANCE ) 10 MG TABS tablet TAKE 1 TABLET(10 MG) BY MOUTH DAILY 02/26/24   Swinyer, Rosaline HERO, NP  epoetin  alfa-epbx (RETACRIT ) 40000 UNIT/ML injection Inject 40,000 Units into the skin every 6 (six) weeks. As needed    [provider]  finasteride  (PROSCAR ) 5 MG tablet Take 5 mg by mouth daily. 06/26/19   [provider]  fluticasone (FLONASE) 50 MCG/ACT nasal spray Place 1 spray into both nostrils daily as needed for allergies or rhinitis.    [provider]  ketotifen  (ZADITOR ) 0.025 % ophthalmic solution Place 1 drop into both eyes 2 (two) times daily as needed (itchy eyes).    [provider]  levothyroxine  (SYNTHROID ) 88 MCG  tablet Take 88 mcg by mouth every morning. 02/06/22   [provider]  pantoprazole  (PROTONIX ) 40 MG tablet Take 1 tablet (40 mg total) by mouth 2 (two) times daily before a meal for 30 days, THEN 1 tablet (40 mg total) daily. 02/25/22 04/14/24  Gonfa, Taye T, MD  potassium chloride  SA (KLOR-CON  M) 20 MEQ tablet Take 1 tablet (20 mEq total) by mouth daily. 02/28/24   Gudena, Vinay, MD  predniSONE  (DELTASONE ) 5 MG tablet Take 5 mg by mouth daily with breakfast. TAKING 15mg  QD FOR 3Wks    [provider]  torsemide  (DEMADEX ) 20 MG tablet TAKE 2 TABLETS BY MOUTH EVERY MORNING AND 1 TABLET IN THE EVENING 01/01/24   Shlomo Wilbert JONELLE, MD    Allergies: Patient has no known allergies.    Review of Systems  Constitutional:  Positive for activity change. Negative for fever.  Skin:  Positive for color change. Negative for wound.  All other systems reviewed and are negative.   Updated Vital Signs BP 128/67   Pulse (!) 59   Temp 98.7 F (37.1 C) (Oral)   Resp 20   Ht 5' 3 (1.6 m)   Wt 50.8 kg   SpO2 100%   BMI 19.84 kg/m   Physical Exam Vitals and nursing note reviewed.  Constitutional:      General: He is not in acute distress.    Appearance: Normal appearance. He is not ill-appearing.  HENT:  Head: Normocephalic and atraumatic.  Eyes:     Extraocular Movements: Extraocular movements intact.     Pupils: Pupils are equal, round, and reactive to light.  Cardiovascular:     Rate and Rhythm: Normal rate.  Pulmonary:     Effort: Pulmonary effort is normal. No respiratory distress.  Abdominal:     Tenderness: There is no guarding.  Musculoskeletal:        General: Swelling and tenderness present. No deformity or signs of injury.     Cervical back: Normal range of motion.     Right lower leg: Edema present.     Left lower leg: Edema present.     Comments: Patient has bilateral edema with history of CHF.  Patient reports swelling in the right is normal for him but the left  foot swelling is not.  Skin:    General: Skin is warm and dry.  Neurological:     General: No focal deficit present.     Mental Status: He is alert.  Psychiatric:        Mood and Affect: Mood normal.        Behavior: Behavior normal.     (all labs ordered are listed, but only abnormal results are displayed) Labs Reviewed - No data to display  EKG: None  Radiology: VAS US  LOWER EXTREMITY VENOUS (DVT) (7a-7p) Result Date: 04/24/2024  Lower Venous DVT Study Patient Name:  [facility]  Date of Exam:   04/24/2024 Medical Rec #: [id number]        Accession #:    [id number] Date of Birth: 11-29-1934        Patient Gender: M Patient Age:   58 years Exam Location:  [facility] Procedure:      VAS US  LOWER EXTREMITY VENOUS (DVT) Referring Phys: BURGESS NANAVATI --------------------------------------------------------------------------------  Indications: Pain, Edema, and Erythema.  Anticoagulation: Eliquis . Comparison Study: Previous exam on 08/15/2022 was negative for DVT Performing Technologist: Ezzie Potters RVT, RDMS  Examination Guidelines: A complete evaluation includes B-mode imaging, spectral Doppler, color Doppler, and power Doppler as needed of all accessible portions of each vessel. Bilateral testing is considered an integral part of a complete examination. Limited examinations for reoccurring indications may be performed as noted. The reflux portion of the exam is performed with the patient in reverse Trendelenburg.  +-----+---------------+---------+-----------+----------+--------------+ RIGHTCompressibilityPhasicitySpontaneityPropertiesThrombus Aging +-----+---------------+---------+-----------+----------+--------------+ CFV  Full           No       Yes        pulsatile                +-----+---------------+---------+-----------+----------+--------------+   +---------+---------------+---------+-----------+----------+--------------+ LEFT      CompressibilityPhasicitySpontaneityPropertiesThrombus Aging +---------+---------------+---------+-----------+----------+--------------+ CFV      Full           No       Yes        pulsatile                +---------+---------------+---------+-----------+----------+--------------+ SFJ      Full                                                        +---------+---------------+---------+-----------+----------+--------------+ FV Prox  Full           Yes      Yes                                 +---------+---------------+---------+-----------+----------+--------------+  FV Mid   Full           Yes      Yes                                 +---------+---------------+---------+-----------+----------+--------------+ FV DistalFull           Yes      Yes                                 +---------+---------------+---------+-----------+----------+--------------+ PFV      Full                                                        +---------+---------------+---------+-----------+----------+--------------+ POP      Full           Yes      Yes                                 +---------+---------------+---------+-----------+----------+--------------+ PTV      Full                                                        +---------+---------------+---------+-----------+----------+--------------+ PERO     Full                                                        +---------+---------------+---------+-----------+----------+--------------+     Summary: RIGHT: - No evidence of common femoral vein obstruction.   LEFT: - There is no evidence of deep vein thrombosis in the lower extremity.  - No cystic structure found in the popliteal fossa. Subcutaneous edema seen in calf, ankle, and foot.  *See table(s) above for measurements and observations.    Preliminary    DG Foot Complete Left Result Date: 04/24/2024 EXAM: 3 or more VIEW(S) XRAY OF THE LEFT FOOT 04/24/2024  06:59:00 AM COMPARISON: None available. CLINICAL HISTORY: Swelling/redness/pain. Diffuse left foot swelling, redness, and pain x 2 days; no known injury. FINDINGS: BONES AND JOINTS: There is a questionable nondisplaced fracture of the proximal metaphysis of the proximal phalanx of the fifth digit suggested on the AP radiograph but not supported on the oblique view. No joint dislocation. SOFT TISSUES: There is diffuse soft tissue swelling of the foot, particularly the forefoot. IMPRESSION: 1. Questionable nondisplaced fracture of the proximal metaphysis of the proximal phalanx of the fifth digit, suggested on the AP radiograph but not supported on the oblique view. 2. Diffuse soft tissue swelling of the foot, particularly the forefoot. Electronically signed by: Evalene Coho MD 04/24/2024 07:15 AM EDT RP Workstation: [id number]    Procedures   Medications Ordered in the ED  oxyCODONE -acetaminophen  (PERCOCET/ROXICET) 5-325 MG per tablet 1 tablet (has no administration in time range)  dalbavancin (DALVANCE ) 1,125 mg in dextrose  5 % 500 mL IVPB (0 mg Intravenous  Stopped 04/24/24 0959)  acetaminophen  (TYLENOL ) tablet 1,000 mg (1,000 mg Oral Given 04/24/24 0829)   89 y.o. male presents to the ED for concern of Foot Pain     This involves an extensive number of treatment options, and is a complaint that carries with it a high risk of complications and morbidity.  The emergent differential diagnosis prior to evaluation includes, but is not limited to: Fracture, cellulitis, necrotizing fasciitis, gout, osteomyelitis, DVT  This is not an exhaustive differential.   Past Medical History / Co-morbidities / Social History: Hx of CHF, A-fib, rheumatoid arthritis, CKD, chronic anemia Social Determinants of Health include: Former tobacco and alcohol .  Additional History:  Obtained by chart review.  Notably IV infusions with hematology, chronic prednisone  use for rheumatoid arthritis, Eliquis  for A-fib with a  pacemaker, follow-up with PCP for CKD.  Lab Tests: I ordered, and personally interpreted labs.  The pertinent results include:      Imaging Studies: I ordered imaging studies including x-ray of left foot Imaging which showed Questionable nondisplaced fracture of the proximal metaphysis of the proximal phalanx of the fifth digit I agree with the radiologist interpretation.   ED Course / Critical Interventions: Pt well-appearing on exam sitting comfortable in ED bed.  Patient has bilateral pitting edema to the ankles.  According to patient and family this is his norm.  He is on Lasix  for CHF.  The left foot has obvious erythema and is tender to palpation.  Foot is slightly warm to touch.  Knees do not have any swelling on exam.  Patient has no obvious wounds and denies any injuries to the foot that he is aware of.  Lungs are clear to auscultation in all fields.  Patient denies any shortness of breath, satting at 100% on room air.  Patient denies any nausea, constipation diarrhea, abdominal pain, dizziness, weakness.  Upon reevaluation, family advised patient is not very compliant with medication and lives at home alone without assistance.  Patient reports he does not want to use a walker.  After discussion with attending it was advised to start Dalvance  and rule out DVT.  Plan is to send home on postop shoe with follow-up with PCP if symptoms do not resolve.  Patient was advised of negative DVT study and follow-up instructions from PCP for possible fracture of fifth digit of the left foot.  Patient given return instructions for ED and agrees to treatment plan and agrees to return instructions. I have reviewed the patients home medicines and have made adjustments as needed.  Disposition: Considered admission and after reviewing the patient's encounter today, I feel that the patient would benefit from discharge and follow-up with PCP.  Discussed course of treatment with the patient, whom  demonstrated understanding.  Patient in agreement and has no further questions.    I discussed this case with my attending, Dr. Charlyn, who agreed with the proposed treatment course and cosigned this note including patient's presenting symptoms, physical exam, and planned diagnostics and interventions.  Attending physician stated agreement with plan or made changes to plan which were implemented.     This chart was dictated using voice recognition software.  Despite best efforts to proofread, errors can occur which can change the documentation meaning.    Final diagnoses:  Cellulitis of left lower extremity    ED Discharge Orders          Ordered    Ambulatory referral to Infectious Disease       Comments: Cellulitis patient:  Received dalbavancin on 04/24/2024.   04/24/24 0815               Myriam Fonda RAMAN, PA-C 04/24/24 1553    Charlyn Sora, MD 04/25/24 [phone]

## 2024-04-24 NOTE — Progress Notes (Signed)
 Orthopedic Tech Progress Note Patient Details:  KYAL ARTS 01-18-1935 [id number]  Ortho Devices Type of Ortho Device: Postop shoe/boot Ortho Device/Splint Location: left Ortho Device/Splint Interventions: Ordered, Application, Adjustment   Post Interventions Patient Tolerated: Well Instructions Provided: Adjustment of device, Care of device  Waylan Thom Loving 04/24/2024, 9:25 AM

## 2024-04-24 NOTE — ED Triage Notes (Signed)
 Pt coming in with complaint of left foot swelling/pain starting 3 days ago. Came on gradually, has progressively worsened. Pt unable to walk today d/t pain. Denies fever, weakness, injury to foot. Hx of CHF. Bilateral foot swelling noted in triage, left more than right. Left foot noticeably more red than right. VSS. Denies SOB.

## 2024-04-25 ENCOUNTER — Encounter (HOSPITAL_COMMUNITY)
Admission: RE | Admit: 2024-04-25 | Discharge: 2024-04-25 | Disposition: A | Discharge status: Home / Self Care | Source: Ambulatory Visit | Attending: Hematology and Oncology | Admitting: Hematology and Oncology

## 2024-04-25 ENCOUNTER — Ambulatory Visit

## 2024-04-25 VITALS — BP 151/75 | HR 61 | Temp 98.8°F | Resp 16

## 2024-04-25 DIAGNOSIS — D638 Anemia in other chronic diseases classified elsewhere: Secondary | ICD-10-CM

## 2024-04-25 DIAGNOSIS — D509 Iron deficiency anemia, unspecified: Secondary | ICD-10-CM | POA: Diagnosis not present

## 2024-04-25 DIAGNOSIS — L03116 Cellulitis of left lower limb: Secondary | ICD-10-CM | POA: Diagnosis not present

## 2024-04-25 MED ORDER — IRON SUCROSE 200 MG IVPB - SIMPLE MED
200.0000 mg | Status: DC
Start: 2024-04-25 — End: 2024-04-26
  Administered 2024-04-25: 200 mg via INTRAVENOUS
  Filled 2024-04-25: qty 200

## 2024-04-28 ENCOUNTER — Ambulatory Visit

## 2024-04-28 ENCOUNTER — Encounter (HOSPITAL_COMMUNITY)
Admission: RE | Admit: 2024-04-28 | Discharge: 2024-04-28 | Disposition: A | Discharge status: Home / Self Care | Source: Ambulatory Visit | Attending: Hematology and Oncology | Admitting: Hematology and Oncology

## 2024-04-28 VITALS — BP 137/96 | HR 61 | Temp 98.4°F | Resp 16

## 2024-04-28 DIAGNOSIS — D509 Iron deficiency anemia, unspecified: Secondary | ICD-10-CM

## 2024-04-28 DIAGNOSIS — D638 Anemia in other chronic diseases classified elsewhere: Secondary | ICD-10-CM

## 2024-04-28 MED ORDER — IRON SUCROSE 200 MG IVPB - SIMPLE MED
200.0000 mg | Status: DC
Start: 2024-04-28 — End: 2024-05-06
  Administered 2024-04-28: 200 mg via INTRAVENOUS
  Filled 2024-04-28: qty 200

## 2024-04-29 DIAGNOSIS — S92515D Nondisplaced fracture of proximal phalanx of left lesser toe(s), subsequent encounter for fracture with routine healing: Secondary | ICD-10-CM | POA: Diagnosis not present

## 2024-04-29 DIAGNOSIS — M79672 Pain in left foot: Secondary | ICD-10-CM | POA: Diagnosis not present

## 2024-04-29 DIAGNOSIS — Z6822 Body mass index (BMI) 22.0-22.9, adult: Secondary | ICD-10-CM | POA: Diagnosis not present

## 2024-04-30 ENCOUNTER — Ambulatory Visit

## 2024-04-30 ENCOUNTER — Encounter (HOSPITAL_COMMUNITY)
Admission: RE | Admit: 2024-04-30 | Discharge: 2024-04-30 | Disposition: A | Discharge status: Home / Self Care | Source: Ambulatory Visit | Attending: Hematology and Oncology | Admitting: Hematology and Oncology

## 2024-04-30 VITALS — BP 136/72 | HR 60 | Temp 97.5°F | Resp 16

## 2024-04-30 DIAGNOSIS — D509 Iron deficiency anemia, unspecified: Secondary | ICD-10-CM | POA: Diagnosis not present

## 2024-04-30 DIAGNOSIS — D638 Anemia in other chronic diseases classified elsewhere: Secondary | ICD-10-CM

## 2024-04-30 MED ORDER — IRON SUCROSE 200 MG IVPB - SIMPLE MED
200.0000 mg | Status: DC
  Administered 2024-04-30: 200 mg via INTRAVENOUS
  Filled 2024-04-30: qty 200

## 2024-05-02 ENCOUNTER — Ambulatory Visit (HOSPITAL_COMMUNITY)
Admission: RE | Admit: 2024-05-02 | Discharge: 2024-05-02 | Disposition: A | Discharge status: Home / Self Care | Source: Ambulatory Visit | Attending: Hematology and Oncology | Admitting: Hematology and Oncology

## 2024-05-02 ENCOUNTER — Ambulatory Visit

## 2024-05-02 VITALS — BP 143/69 | HR 60 | Temp 97.9°F | Resp 16

## 2024-05-02 DIAGNOSIS — D509 Iron deficiency anemia, unspecified: Secondary | ICD-10-CM | POA: Diagnosis not present

## 2024-05-02 DIAGNOSIS — D638 Anemia in other chronic diseases classified elsewhere: Secondary | ICD-10-CM | POA: Insufficient documentation

## 2024-05-02 MED ORDER — IRON SUCROSE 200 MG IVPB - SIMPLE MED
200.0000 mg | Status: DC
Start: 2024-05-02 — End: 2024-05-03
  Administered 2024-05-02: 200 mg via INTRAVENOUS
  Filled 2024-05-02: qty 110

## 2024-05-06 ENCOUNTER — Inpatient Hospital Stay

## 2024-05-06 ENCOUNTER — Inpatient Hospital Stay: Attending: Hematology and Oncology

## 2024-05-06 VITALS — BP 138/86 | HR 65 | Temp 97.7°F | Resp 14

## 2024-05-06 DIAGNOSIS — D563 Thalassemia minor: Secondary | ICD-10-CM | POA: Diagnosis not present

## 2024-05-06 DIAGNOSIS — D638 Anemia in other chronic diseases classified elsewhere: Secondary | ICD-10-CM | POA: Diagnosis not present

## 2024-05-06 DIAGNOSIS — D696 Thrombocytopenia, unspecified: Secondary | ICD-10-CM

## 2024-05-06 LAB — CMP (CANCER CENTER ONLY)
ALT: 15 U/L (ref 0–44)
AST: 22 U/L (ref 15–41)
Albumin: 3.8 g/dL (ref 3.5–5.0)
Alkaline Phosphatase: 50 U/L (ref 38–126)
Anion gap: 7 (ref 5–15)
BUN: 24 mg/dL — ABNORMAL HIGH (ref 8–23)
CO2: 31 mmol/L (ref 22–32)
Calcium: 9.3 mg/dL (ref 8.9–10.3)
Chloride: 102 mmol/L (ref 98–111)
Creatinine: 1.54 mg/dL — ABNORMAL HIGH (ref 0.61–1.24)
GFR, Estimated: 43 mL/min — ABNORMAL LOW (ref >60)
Glucose, Bld: 115 mg/dL — ABNORMAL HIGH (ref 70–99)
Potassium: 4.6 mmol/L (ref 3.5–5.1)
Sodium: 140 mmol/L (ref 135–145)
Total Bilirubin: 0.6 mg/dL (ref 0.0–1.2)
Total Protein: 6.2 g/dL — ABNORMAL LOW (ref 6.5–8.1)

## 2024-05-06 LAB — CBC WITH DIFFERENTIAL (CANCER CENTER ONLY)
Abs Immature Granulocytes: 0.17 K/uL — ABNORMAL HIGH (ref 0.00–0.07)
Basophils Absolute: 0.1 K/uL (ref 0.0–0.1)
Basophils Relative: 0 %
Eosinophils Absolute: 0.1 K/uL (ref 0.0–0.5)
Eosinophils Relative: 1 %
HCT: 32.7 % — ABNORMAL LOW (ref 39.0–52.0)
Hemoglobin: 9.9 g/dL — ABNORMAL LOW (ref 13.0–17.0)
Immature Granulocytes: 1 %
Lymphocytes Relative: 9 %
Lymphs Abs: 1.2 K/uL (ref 0.7–4.0)
MCH: 19.9 pg — ABNORMAL LOW (ref 26.0–34.0)
MCHC: 30.3 g/dL (ref 30.0–36.0)
MCV: 65.7 fL — ABNORMAL LOW (ref 80.0–100.0)
Monocytes Absolute: 0.6 K/uL (ref 0.1–1.0)
Monocytes Relative: 5 %
Neutro Abs: 10.4 K/uL — ABNORMAL HIGH (ref 1.7–7.7)
Neutrophils Relative %: 84 %
Platelet Count: 257 K/uL (ref 150–400)
RBC: 4.98 MIL/uL (ref 4.22–5.81)
RDW: 26.1 % — ABNORMAL HIGH (ref 11.5–15.5)
WBC Count: 12.5 K/uL — ABNORMAL HIGH (ref 4.0–10.5)
nRBC: 0.2 % (ref 0.0–0.2)

## 2024-05-06 MED ORDER — CYANOCOBALAMIN 1000 MCG/ML IJ SOLN
1000.0000 ug | Freq: Once | INTRAMUSCULAR | Status: AC
  Administered 2024-05-06: 1000 ug via INTRAMUSCULAR
  Filled 2024-05-06: qty 1

## 2024-05-06 MED ORDER — EPOETIN ALFA-EPBX 40000 UNIT/ML IJ SOLN
40000.0000 [IU] | Freq: Once | INTRAMUSCULAR | Status: AC
  Administered 2024-05-06: 40000 [IU] via SUBCUTANEOUS
  Filled 2024-05-06: qty 1

## 2024-05-13 DIAGNOSIS — S92515D Nondisplaced fracture of proximal phalanx of left lesser toe(s), subsequent encounter for fracture with routine healing: Secondary | ICD-10-CM | POA: Diagnosis not present

## 2024-05-13 DIAGNOSIS — L03116 Cellulitis of left lower limb: Secondary | ICD-10-CM | POA: Diagnosis not present

## 2024-05-13 DIAGNOSIS — M79672 Pain in left foot: Secondary | ICD-10-CM | POA: Diagnosis not present

## 2024-05-13 DIAGNOSIS — M069 Rheumatoid arthritis, unspecified: Secondary | ICD-10-CM | POA: Diagnosis not present

## 2024-05-13 DIAGNOSIS — M109 Gout, unspecified: Secondary | ICD-10-CM | POA: Diagnosis not present

## 2024-05-14 DIAGNOSIS — Z6821 Body mass index (BMI) 21.0-21.9, adult: Secondary | ICD-10-CM | POA: Diagnosis not present

## 2024-05-14 DIAGNOSIS — M1991 Primary osteoarthritis, unspecified site: Secondary | ICD-10-CM | POA: Diagnosis not present

## 2024-05-14 DIAGNOSIS — M0579 Rheumatoid arthritis with rheumatoid factor of multiple sites without organ or systems involvement: Secondary | ICD-10-CM | POA: Diagnosis not present

## 2024-05-28 ENCOUNTER — Inpatient Hospital Stay: Admitting: Hematology and Oncology

## 2024-05-28 ENCOUNTER — Inpatient Hospital Stay

## 2024-05-28 ENCOUNTER — Ambulatory Visit

## 2024-05-28 VITALS — BP 130/78 | HR 88 | Temp 97.9°F | Resp 17 | Ht 63.0 in | Wt 115.2 lb

## 2024-05-28 DIAGNOSIS — D638 Anemia in other chronic diseases classified elsewhere: Secondary | ICD-10-CM

## 2024-05-28 DIAGNOSIS — D563 Thalassemia minor: Secondary | ICD-10-CM | POA: Diagnosis not present

## 2024-05-28 DIAGNOSIS — D649 Anemia, unspecified: Secondary | ICD-10-CM

## 2024-05-28 DIAGNOSIS — D696 Thrombocytopenia, unspecified: Secondary | ICD-10-CM

## 2024-05-28 LAB — CBC WITH DIFFERENTIAL (CANCER CENTER ONLY)
Abs Immature Granulocytes: 0.09 K/uL — ABNORMAL HIGH (ref 0.00–0.07)
Basophils Absolute: 0 K/uL (ref 0.0–0.1)
Basophils Relative: 0 %
Eosinophils Absolute: 0.2 K/uL (ref 0.0–0.5)
Eosinophils Relative: 2 %
HCT: 33.4 % — ABNORMAL LOW (ref 39.0–52.0)
Hemoglobin: 10.6 g/dL — ABNORMAL LOW (ref 13.0–17.0)
Immature Granulocytes: 1 %
Lymphocytes Relative: 7 %
Lymphs Abs: 0.8 K/uL (ref 0.7–4.0)
MCH: 20.8 pg — ABNORMAL LOW (ref 26.0–34.0)
MCHC: 31.7 g/dL (ref 30.0–36.0)
MCV: 65.5 fL — ABNORMAL LOW (ref 80.0–100.0)
Monocytes Absolute: 0.5 K/uL (ref 0.1–1.0)
Monocytes Relative: 5 %
Neutro Abs: 9.8 K/uL — ABNORMAL HIGH (ref 1.7–7.7)
Neutrophils Relative %: 85 %
Platelet Count: 123 K/uL — ABNORMAL LOW (ref 150–400)
RBC: 5.1 MIL/uL (ref 4.22–5.81)
RDW: 24.7 % — ABNORMAL HIGH (ref 11.5–15.5)
WBC Count: 11.4 K/uL — ABNORMAL HIGH (ref 4.0–10.5)
nRBC: 0.3 % — ABNORMAL HIGH (ref 0.0–0.2)

## 2024-05-28 LAB — CMP (CANCER CENTER ONLY)
ALT: 9 U/L (ref 0–44)
AST: 18 U/L (ref 15–41)
Albumin: 3.8 g/dL (ref 3.5–5.0)
Alkaline Phosphatase: 50 U/L (ref 38–126)
Anion gap: 6 (ref 5–15)
BUN: 19 mg/dL (ref 8–23)
CO2: 30 mmol/L (ref 22–32)
Calcium: 8.6 mg/dL — ABNORMAL LOW (ref 8.9–10.3)
Chloride: 104 mmol/L (ref 98–111)
Creatinine: 1.45 mg/dL — ABNORMAL HIGH (ref 0.61–1.24)
GFR, Estimated: 47 mL/min — ABNORMAL LOW (ref >60)
Glucose, Bld: 102 mg/dL — ABNORMAL HIGH (ref 70–99)
Potassium: 3.6 mmol/L (ref 3.5–5.1)
Sodium: 140 mmol/L (ref 135–145)
Total Bilirubin: 0.5 mg/dL (ref 0.0–1.2)
Total Protein: 5.9 g/dL — ABNORMAL LOW (ref 6.5–8.1)

## 2024-05-28 NOTE — Assessment & Plan Note (Signed)
 Hemoglobin electrophoresis: Beta thalassemia minor.    Hospitalization 09/22/2019-09/25/2019: A. fib with RVR status post cardioversion, currently on Eliquis  Hospitalization 02/28/2022-03/11/2022: Atrial fibrillation: Cardioversion planned for 03/22/2022. 05/12/22: Ablation and Pacemaker Implantation  06/12/2023: EGD with Botox    Lab review: 09/25/2019: WBC 7.2, hemoglobin 9.6, platelets 145 04/14/22: Hb 7.8, Platelets 91 (PRBC) 06/14/22: Hb 10.4, Pl 151, Cr 1.66, Iron  sat: 16%, Ferritin 287 07/05/22: Hemoglobin 9.7 08/14/22: Hemoglobin 7.4 09/26/2022: Hemoglobin 9.1 (Retacrit  injection today) 04/12/2023: Hemoglobin 10.4 (Retacrit  and starting B12 injections) 05/24/2023: Hemoglobin 9.9 (Retacrit , B12 and IV iron ) 07/05/2023: Hemoglobin 10.8 08/08/2023: Hemoglobin 11.2 09/21/2023: Hemoglobin 11.1 (Retacrit  held) 10/11/2023: Hemoglobin 10.7 12/27/2023: Hemoglobin 10.1 (getting B12 and Retacrit ) 02/28/2024: Hemoglobin 9.4 04/14/2024: Hemoglobin 8.1 (1 unit PRBC)   IV iron : March 2024, September 2024, August 2025   Current treatment:  Retacrit  40,000 units every 3 weeks Recheck labs in 3 weeks      He will come back again in 3 weeks for lab check and in 9 weeks for follow-up with me.

## 2024-05-28 NOTE — Progress Notes (Signed)
 Patient Care Team: [name], MD as PCP - General (Family Medicine) [name], MD as PCP - Cardiology (Cardiology) [name], MD as PCP - Electrophysiology (Cardiology) [name], MD as Consulting Physician (Hematology and Oncology)  DIAGNOSIS:  Encounter Diagnosis  Name Primary?   Anemia of chronic disease Yes    CHIEF COMPLIANT: Follow-up of anemia  HISTORY OF PRESENT ILLNESS: [facility] is a 89 year old with above-mentioned history of chronic anemia who has been receiving Retacrit  injections.  He was given IV iron  treatment in August and today his hemoglobin has improved to 10.6.  Clinically he does not feel any differently.  He continues to have profound fatigue. ALLERGIES:  has no known allergies.  MEDICATIONS:  Current Outpatient Medications  Medication Sig Dispense Refill   cholestyramine (QUESTRAN) 4 g packet Take 4 g by mouth daily.     CREON 24000-76000 units CPEP as directed Orally three times a day     ELIQUIS  2.5 MG TABS tablet TAKE 1 TABLET(2.5 MG) BY MOUTH TWICE DAILY 180 tablet 1   empagliflozin  (JARDIANCE ) 10 MG TABS tablet TAKE 1 TABLET(10 MG) BY MOUTH DAILY 90 tablet 3   epoetin  alfa-epbx (RETACRIT ) 40000 UNIT/ML injection Inject 40,000 Units into the skin every 6 (six) weeks. As needed     finasteride  (PROSCAR ) 5 MG tablet Take 5 mg by mouth daily.     fluticasone (FLONASE) 50 MCG/ACT nasal spray Place 1 spray into both nostrils daily as needed for allergies or rhinitis.     ketotifen  (ZADITOR ) 0.025 % ophthalmic solution Place 1 drop into both eyes 2 (two) times daily as needed (itchy eyes).     levothyroxine  (SYNTHROID ) 88 MCG tablet Take 88 mcg by mouth every morning.     pantoprazole  (PROTONIX ) 40 MG tablet Take 1 tablet (40 mg total) by mouth 2 (two) times daily before a meal for 30 days, THEN 1 tablet (40 mg total) daily. 120 tablet 0   potassium chloride  SA (KLOR-CON  M) 20 MEQ tablet Take 1 tablet (20 mEq total) by mouth daily. 90  tablet 3   predniSONE  (DELTASONE ) 5 MG tablet Take 5 mg by mouth daily with breakfast. TAKING 15mg  QD FOR 3Wks     torsemide  (DEMADEX ) 20 MG tablet TAKE 2 TABLETS BY MOUTH EVERY MORNING AND 1 TABLET IN THE EVENING 270 tablet 3   Cyanocobalamin  (B-12) 50 MCG TABS      No current facility-administered medications for this visit.    PHYSICAL EXAMINATION: ECOG PERFORMANCE STATUS: 1 - Symptomatic but completely ambulatory  Vitals:   05/28/24 0817 05/28/24 0820  BP: (!) 160/129 130/78  Pulse: 88   Resp: 17   Temp: 97.9 F (36.6 C)   SpO2: 100%    Filed Weights   05/28/24 0817  Weight: 115 lb 3.2 oz (52.3 kg)     LABORATORY DATA:  I have reviewed the data as listed    Latest Ref Rng & Units 05/28/2024    8:04 AM 05/06/2024    8:37 AM 04/14/2024    7:47 AM  CMP  Glucose 70 - 99 mg/dL 897  884  880   BUN 8 - 23 mg/dL 19  24  26    Creatinine 0.61 - 1.24 mg/dL 8.54  8.45  8.29   Sodium 135 - 145 mmol/L 140  140  139   Potassium 3.5 - 5.1 mmol/L 3.6  4.6  3.4   Chloride 98 - 111 mmol/L 104  102  102   CO2  22 - 32 mmol/L 30  31  29    Calcium 8.9 - 10.3 mg/dL 8.6  9.3  8.9   Total Protein 6.5 - 8.1 g/dL 5.9  6.2  6.2   Total Bilirubin 0.0 - 1.2 mg/dL 0.5  0.6  0.7   Alkaline Phos 38 - 126 U/L 50  50  48   AST 15 - 41 U/L 18  22  13    ALT 0 - 44 U/L 9  15  10      Lab Results  Component Value Date   WBC 11.4 (H) 05/28/2024   HGB 10.6 (L) 05/28/2024   HCT 33.4 (L) 05/28/2024   MCV 65.5 (L) 05/28/2024   PLT 123 (L) 05/28/2024   NEUTROABS 9.8 (H) 05/28/2024    ASSESSMENT & PLAN:  Anemia of chronic disease Hemoglobin electrophoresis: Beta thalassemia minor.    Hospitalization 09/22/2019-09/25/2019: A. fib with RVR status post cardioversion, currently on Eliquis  Hospitalization 02/28/2022-03/11/2022: Atrial fibrillation: Cardioversion planned for 03/22/2022. 05/12/22: Ablation and Pacemaker Implantation  06/12/2023: EGD with Botox    Lab review: 09/25/2019: WBC 7.2, hemoglobin 9.6,  platelets 145 04/14/22: Hb 7.8, Platelets 91 (PRBC) 06/14/22: Hb 10.4, Pl 151, Cr 1.66, Iron  sat: 16%, Ferritin 287 07/05/22: Hemoglobin 9.7 08/14/22: Hemoglobin 7.4 09/26/2022: Hemoglobin 9.1 (Retacrit  injection today) 04/12/2023: Hemoglobin 10.4 (Retacrit  and starting B12 injections) 05/24/2023: Hemoglobin 9.9 (Retacrit , B12 and IV iron ) 07/05/2023: Hemoglobin 10.8 08/08/2023: Hemoglobin 11.2 09/21/2023: Hemoglobin 11.1 (Retacrit  held) 10/11/2023: Hemoglobin 10.7 12/27/2023: Hemoglobin 10.1 (getting B12 and Retacrit ) 02/28/2024: Hemoglobin 9.4 04/14/2024: Hemoglobin 8.1 (1 unit PRBC) 05/28/2024: Hemoglobin 10.6 (Retacrit  held)   IV iron : March 2024, September 2024, August 2025   Current treatment:  Retacrit  40,000 units every 3 weeks (holding today's injection) Recheck labs in 3 weeks      He will come back again in 3 weeks for lab check and in 9 weeks for follow-up with me.     No orders of the defined types were placed in this encounter.  The patient has a good understanding of the overall plan. he agrees with it. he will call with any problems that may develop before the next visit here. Total time spent: 30 mins including face to face time and time spent for planning, charting and co-ordination of care   [facility] MARLA Chad, MD 05/28/24

## 2024-05-30 ENCOUNTER — Other Ambulatory Visit: Payer: Self-pay | Admitting: Nurse Practitioner

## 2024-06-12 ENCOUNTER — Ambulatory Visit (INDEPENDENT_AMBULATORY_CARE_PROVIDER_SITE_OTHER): Payer: Medicare PPO

## 2024-06-12 DIAGNOSIS — I480 Paroxysmal atrial fibrillation: Secondary | ICD-10-CM

## 2024-06-12 LAB — CUP PACEART REMOTE DEVICE CHECK
Battery Remaining Longevity: 130 mo
Battery Voltage: 3.02 V
Brady Statistic AP VP Percent: 64.82 %
Brady Statistic AP VS Percent: 0 %
Brady Statistic AS VP Percent: 34.94 %
Brady Statistic AS VS Percent: 0.11 %
Brady Statistic RA Percent Paced: 8.39 %
Brady Statistic RV Percent Paced: 99.78 %
Date Time Interrogation Session: 20251009024332
Implantable Lead Connection Status: 753985
Implantable Lead Connection Status: 753985
Implantable Lead Implant Date: 20230908
Implantable Lead Implant Date: 20230908
Implantable Lead Location: 753859
Implantable Lead Location: 753860
Implantable Lead Model: 3830
Implantable Lead Model: 5076
Implantable Pulse Generator Implant Date: 20230908
Lead Channel Impedance Value: 304 Ohm
Lead Channel Impedance Value: 361 Ohm
Lead Channel Impedance Value: 399 Ohm
Lead Channel Impedance Value: 513 Ohm
Lead Channel Pacing Threshold Amplitude: 0.75 V
Lead Channel Pacing Threshold Amplitude: 0.875 V
Lead Channel Pacing Threshold Pulse Width: 0.4 ms
Lead Channel Pacing Threshold Pulse Width: 0.4 ms
Lead Channel Sensing Intrinsic Amplitude: 17.75 mV
Lead Channel Sensing Intrinsic Amplitude: 17.75 mV
Lead Channel Sensing Intrinsic Amplitude: 2 mV
Lead Channel Sensing Intrinsic Amplitude: 2 mV
Lead Channel Setting Pacing Amplitude: 2 V
Lead Channel Setting Pacing Amplitude: 2.75 V
Lead Channel Setting Pacing Pulse Width: 0.4 ms
Lead Channel Setting Sensing Sensitivity: 1.2 mV
Zone Setting Status: 755011

## 2024-06-12 NOTE — Progress Notes (Signed)
 Remote PPM Transmission

## 2024-06-17 NOTE — Progress Notes (Signed)
 Remote PPM Transmission

## 2024-06-18 ENCOUNTER — Ambulatory Visit: Payer: Self-pay | Admitting: Internal Medicine

## 2024-06-18 ENCOUNTER — Inpatient Hospital Stay

## 2024-06-18 ENCOUNTER — Inpatient Hospital Stay: Attending: Hematology and Oncology

## 2024-06-18 DIAGNOSIS — D563 Thalassemia minor: Secondary | ICD-10-CM | POA: Diagnosis not present

## 2024-06-18 DIAGNOSIS — D696 Thrombocytopenia, unspecified: Secondary | ICD-10-CM

## 2024-06-18 DIAGNOSIS — D638 Anemia in other chronic diseases classified elsewhere: Secondary | ICD-10-CM | POA: Insufficient documentation

## 2024-06-18 LAB — CBC WITH DIFFERENTIAL (CANCER CENTER ONLY)
Abs Immature Granulocytes: 0.04 K/uL (ref 0.00–0.07)
Basophils Absolute: 0 K/uL (ref 0.0–0.1)
Basophils Relative: 0 %
Eosinophils Absolute: 0.1 K/uL (ref 0.0–0.5)
Eosinophils Relative: 1 %
HCT: 35 % — ABNORMAL LOW (ref 39.0–52.0)
Hemoglobin: 11.1 g/dL — ABNORMAL LOW (ref 13.0–17.0)
Immature Granulocytes: 1 %
Lymphocytes Relative: 7 %
Lymphs Abs: 0.6 K/uL — ABNORMAL LOW (ref 0.7–4.0)
MCH: 20.9 pg — ABNORMAL LOW (ref 26.0–34.0)
MCHC: 31.7 g/dL (ref 30.0–36.0)
MCV: 65.9 fL — ABNORMAL LOW (ref 80.0–100.0)
Monocytes Absolute: 0.7 K/uL (ref 0.1–1.0)
Monocytes Relative: 8 %
Neutro Abs: 7.4 K/uL (ref 1.7–7.7)
Neutrophils Relative %: 83 %
Platelet Count: 160 K/uL (ref 150–400)
RBC: 5.31 MIL/uL (ref 4.22–5.81)
RDW: 22.2 % — ABNORMAL HIGH (ref 11.5–15.5)
WBC Count: 8.9 K/uL (ref 4.0–10.5)
nRBC: 0.2 % (ref 0.0–0.2)

## 2024-06-18 LAB — CMP (CANCER CENTER ONLY)
ALT: 10 U/L (ref 0–44)
AST: 18 U/L (ref 15–41)
Albumin: 3.9 g/dL (ref 3.5–5.0)
Alkaline Phosphatase: 70 U/L (ref 38–126)
Anion gap: 8 (ref 5–15)
BUN: 27 mg/dL — ABNORMAL HIGH (ref 8–23)
CO2: 28 mmol/L (ref 22–32)
Calcium: 9.3 mg/dL (ref 8.9–10.3)
Chloride: 103 mmol/L (ref 98–111)
Creatinine: 1.67 mg/dL — ABNORMAL HIGH (ref 0.61–1.24)
GFR, Estimated: 39 mL/min — ABNORMAL LOW (ref >60)
Glucose, Bld: 134 mg/dL — ABNORMAL HIGH (ref 70–99)
Potassium: 3.4 mmol/L — ABNORMAL LOW (ref 3.5–5.1)
Sodium: 139 mmol/L (ref 135–145)
Total Bilirubin: 0.6 mg/dL (ref 0.0–1.2)
Total Protein: 6.1 g/dL — ABNORMAL LOW (ref 6.5–8.1)

## 2024-06-18 MED ORDER — CYANOCOBALAMIN 1000 MCG/ML IJ SOLN
1000.0000 ug | Freq: Once | INTRAMUSCULAR | Status: AC
  Administered 2024-06-18: 1000 ug via INTRAMUSCULAR
  Filled 2024-06-18: qty 1

## 2024-06-18 NOTE — Progress Notes (Signed)
 Retacrit  held today due to Hgb 11.1. Patient made aware, verbalized understanding.

## 2024-06-25 DIAGNOSIS — H353211 Exudative age-related macular degeneration, right eye, with active choroidal neovascularization: Secondary | ICD-10-CM | POA: Diagnosis not present

## 2024-07-09 ENCOUNTER — Inpatient Hospital Stay

## 2024-07-09 ENCOUNTER — Inpatient Hospital Stay: Attending: Hematology and Oncology

## 2024-07-09 VITALS — BP 116/78 | HR 86 | Temp 97.8°F | Resp 15

## 2024-07-09 DIAGNOSIS — D638 Anemia in other chronic diseases classified elsewhere: Secondary | ICD-10-CM | POA: Diagnosis not present

## 2024-07-09 DIAGNOSIS — D563 Thalassemia minor: Secondary | ICD-10-CM | POA: Diagnosis not present

## 2024-07-09 DIAGNOSIS — D696 Thrombocytopenia, unspecified: Secondary | ICD-10-CM

## 2024-07-09 DIAGNOSIS — Z7901 Long term (current) use of anticoagulants: Secondary | ICD-10-CM | POA: Diagnosis not present

## 2024-07-09 DIAGNOSIS — I4891 Unspecified atrial fibrillation: Secondary | ICD-10-CM | POA: Diagnosis not present

## 2024-07-09 LAB — CMP (CANCER CENTER ONLY)
ALT: 11 U/L (ref 0–44)
AST: 22 U/L (ref 15–41)
Albumin: 3.7 g/dL (ref 3.5–5.0)
Alkaline Phosphatase: 63 U/L (ref 38–126)
Anion gap: 7 (ref 5–15)
BUN: 16 mg/dL (ref 8–23)
CO2: 29 mmol/L (ref 22–32)
Calcium: 8.8 mg/dL — ABNORMAL LOW (ref 8.9–10.3)
Chloride: 103 mmol/L (ref 98–111)
Creatinine: 1.55 mg/dL — ABNORMAL HIGH (ref 0.61–1.24)
GFR, Estimated: 43 mL/min — ABNORMAL LOW (ref >60)
Glucose, Bld: 130 mg/dL — ABNORMAL HIGH (ref 70–99)
Potassium: 4.2 mmol/L (ref 3.5–5.1)
Sodium: 139 mmol/L (ref 135–145)
Total Bilirubin: 0.6 mg/dL (ref 0.0–1.2)
Total Protein: 6.1 g/dL — ABNORMAL LOW (ref 6.5–8.1)

## 2024-07-09 LAB — CBC WITH DIFFERENTIAL (CANCER CENTER ONLY)
Abs Immature Granulocytes: 0.09 K/uL — ABNORMAL HIGH (ref 0.00–0.07)
Basophils Absolute: 0.1 K/uL (ref 0.0–0.1)
Basophils Relative: 1 %
Eosinophils Absolute: 0.1 K/uL (ref 0.0–0.5)
Eosinophils Relative: 1 %
HCT: 33.4 % — ABNORMAL LOW (ref 39.0–52.0)
Hemoglobin: 10.3 g/dL — ABNORMAL LOW (ref 13.0–17.0)
Immature Granulocytes: 1 %
Lymphocytes Relative: 8 %
Lymphs Abs: 0.9 K/uL (ref 0.7–4.0)
MCH: 20.9 pg — ABNORMAL LOW (ref 26.0–34.0)
MCHC: 30.8 g/dL (ref 30.0–36.0)
MCV: 67.6 fL — ABNORMAL LOW (ref 80.0–100.0)
Monocytes Absolute: 0.6 K/uL (ref 0.1–1.0)
Monocytes Relative: 5 %
Neutro Abs: 9.3 K/uL — ABNORMAL HIGH (ref 1.7–7.7)
Neutrophils Relative %: 84 %
Platelet Count: 186 K/uL (ref 150–400)
RBC: 4.94 MIL/uL (ref 4.22–5.81)
RDW: 19.8 % — ABNORMAL HIGH (ref 11.5–15.5)
WBC Count: 10.9 K/uL — ABNORMAL HIGH (ref 4.0–10.5)
nRBC: 0.2 % (ref 0.0–0.2)

## 2024-07-09 MED ORDER — EPOETIN ALFA-EPBX 40000 UNIT/ML IJ SOLN
40000.0000 [IU] | Freq: Once | INTRAMUSCULAR | Status: AC
  Administered 2024-07-09: 40000 [IU] via SUBCUTANEOUS
  Filled 2024-07-09: qty 1

## 2024-07-09 MED ORDER — CYANOCOBALAMIN 1000 MCG/ML IJ SOLN
1000.0000 ug | Freq: Once | INTRAMUSCULAR | Status: AC
  Administered 2024-07-09: 1000 ug via INTRAMUSCULAR
  Filled 2024-07-09: qty 1

## 2024-07-29 ENCOUNTER — Ambulatory Visit (HOSPITAL_COMMUNITY)
Admission: RE | Admit: 2024-07-29 | Discharge: 2024-07-29 | Disposition: A | Discharge status: Home / Self Care | Source: Ambulatory Visit | Attending: Internal Medicine | Admitting: Internal Medicine

## 2024-07-29 ENCOUNTER — Encounter: Payer: Self-pay | Admitting: Cardiology

## 2024-07-29 ENCOUNTER — Other Ambulatory Visit: Payer: Self-pay

## 2024-07-29 ENCOUNTER — Ambulatory Visit: Payer: Self-pay | Admitting: Cardiology

## 2024-07-29 DIAGNOSIS — I340 Nonrheumatic mitral (valve) insufficiency: Secondary | ICD-10-CM

## 2024-07-29 DIAGNOSIS — I349 Nonrheumatic mitral valve disorder, unspecified: Secondary | ICD-10-CM

## 2024-07-29 DIAGNOSIS — I059 Rheumatic mitral valve disease, unspecified: Secondary | ICD-10-CM | POA: Insufficient documentation

## 2024-07-29 LAB — ECHOCARDIOGRAM COMPLETE
AR max vel: 0.73 cm2
AV Area VTI: 0.8 cm2
AV Area mean vel: 0.76 cm2
AV Mean grad: 12 mmHg
AV Peak grad: 20.6 mmHg
Ao pk vel: 2.27 m/s
Area-P 1/2: 2.41 cm2
MV M vel: 6.16 m/s
MV Peak grad: 151.8 mmHg
Radius: 0.8 cm
S' Lateral: 3.5 cm

## 2024-07-30 ENCOUNTER — Inpatient Hospital Stay: Admitting: Hematology and Oncology

## 2024-07-30 ENCOUNTER — Inpatient Hospital Stay

## 2024-07-30 VITALS — BP 109/85 | HR 86 | Temp 98.0°F | Resp 16 | Wt 117.9 lb

## 2024-07-30 DIAGNOSIS — D638 Anemia in other chronic diseases classified elsewhere: Secondary | ICD-10-CM

## 2024-07-30 DIAGNOSIS — D649 Anemia, unspecified: Secondary | ICD-10-CM | POA: Diagnosis not present

## 2024-07-30 DIAGNOSIS — D696 Thrombocytopenia, unspecified: Secondary | ICD-10-CM

## 2024-07-30 DIAGNOSIS — D563 Thalassemia minor: Secondary | ICD-10-CM | POA: Diagnosis not present

## 2024-07-30 LAB — CBC WITH DIFFERENTIAL (CANCER CENTER ONLY)
Abs Immature Granulocytes: 0.05 K/uL (ref 0.00–0.07)
Basophils Absolute: 0 K/uL (ref 0.0–0.1)
Basophils Relative: 0 %
Eosinophils Absolute: 0.1 K/uL (ref 0.0–0.5)
Eosinophils Relative: 1 %
HCT: 33.5 % — ABNORMAL LOW (ref 39.0–52.0)
Hemoglobin: 10.6 g/dL — ABNORMAL LOW (ref 13.0–17.0)
Immature Granulocytes: 1 %
Lymphocytes Relative: 10 %
Lymphs Abs: 0.9 K/uL (ref 0.7–4.0)
MCH: 21.5 pg — ABNORMAL LOW (ref 26.0–34.0)
MCHC: 31.6 g/dL (ref 30.0–36.0)
MCV: 68.1 fL — ABNORMAL LOW (ref 80.0–100.0)
Monocytes Absolute: 0.5 K/uL (ref 0.1–1.0)
Monocytes Relative: 6 %
Neutro Abs: 7.4 K/uL (ref 1.7–7.7)
Neutrophils Relative %: 82 %
Platelet Count: 186 K/uL (ref 150–400)
RBC: 4.92 MIL/uL (ref 4.22–5.81)
RDW: 17.6 % — ABNORMAL HIGH (ref 11.5–15.5)
WBC Count: 9 K/uL (ref 4.0–10.5)
nRBC: 0.3 % — ABNORMAL HIGH (ref 0.0–0.2)

## 2024-07-30 LAB — CMP (CANCER CENTER ONLY)
ALT: 9 U/L (ref 0–44)
AST: 24 U/L (ref 15–41)
Albumin: 3.9 g/dL (ref 3.5–5.0)
Alkaline Phosphatase: 70 U/L (ref 38–126)
Anion gap: 9 (ref 5–15)
BUN: 19 mg/dL (ref 8–23)
CO2: 28 mmol/L (ref 22–32)
Calcium: 9.4 mg/dL (ref 8.9–10.3)
Chloride: 103 mmol/L (ref 98–111)
Creatinine: 1.49 mg/dL — ABNORMAL HIGH (ref 0.61–1.24)
GFR, Estimated: 45 mL/min — ABNORMAL LOW (ref >60)
Glucose, Bld: 115 mg/dL — ABNORMAL HIGH (ref 70–99)
Potassium: 4.4 mmol/L (ref 3.5–5.1)
Sodium: 140 mmol/L (ref 135–145)
Total Bilirubin: 0.5 mg/dL (ref 0.0–1.2)
Total Protein: 6.1 g/dL — ABNORMAL LOW (ref 6.5–8.1)

## 2024-07-30 MED ORDER — CYANOCOBALAMIN 1000 MCG/ML IJ SOLN
1000.0000 ug | Freq: Once | INTRAMUSCULAR | Status: AC
  Administered 2024-07-30: 1000 ug via INTRAMUSCULAR
  Filled 2024-07-30: qty 1

## 2024-07-30 MED ORDER — EPOETIN ALFA-EPBX 40000 UNIT/ML IJ SOLN
40000.0000 [IU] | Freq: Once | INTRAMUSCULAR | Status: AC
  Administered 2024-07-30: 40000 [IU] via SUBCUTANEOUS
  Filled 2024-07-30: qty 1

## 2024-07-30 NOTE — Assessment & Plan Note (Signed)
 Hemoglobin electrophoresis: Beta thalassemia minor.    Hospitalization 09/22/2019-09/25/2019: A. fib with RVR status post cardioversion, currently on Eliquis  Hospitalization 02/28/2022-03/11/2022: Atrial fibrillation: Cardioversion planned for 03/22/2022. 05/12/22: Ablation and Pacemaker Implantation  06/12/2023: EGD with Botox    Lab review: 09/25/2019: WBC 7.2, hemoglobin 9.6, platelets 145 04/14/22: Hb 7.8, Platelets 91 (PRBC) 06/14/22: Hb 10.4, Pl 151, Cr 1.66, Iron  sat: 16%, Ferritin 287 07/05/22: Hemoglobin 9.7 08/14/22: Hemoglobin 7.4 09/26/2022: Hemoglobin 9.1 (Retacrit  injection today) 04/12/2023: Hemoglobin 10.4 (Retacrit  and starting B12 injections) 05/24/2023: Hemoglobin 9.9 (Retacrit , B12 and IV iron ) 07/05/2023: Hemoglobin 10.8 08/08/2023: Hemoglobin 11.2 09/21/2023: Hemoglobin 11.1 (Retacrit  held) 10/11/2023: Hemoglobin 10.7 12/27/2023: Hemoglobin 10.1 (getting B12 and Retacrit ) 02/28/2024: Hemoglobin 9.4 04/14/2024: Hemoglobin 8.1 (1 unit PRBC) 05/28/2024: Hemoglobin 10.6 (Retacrit  held)   IV iron : March 2024, September 2024, August 2025   Current treatment:  Retacrit  40,000 units every 3 weeks (holding today's injection) Recheck labs in 3 weeks      He will come back again in 3 weeks for lab check and in 9 weeks for follow-up with me.

## 2024-07-30 NOTE — Progress Notes (Signed)
 Patient Care Team: [name], MD as PCP - General (Family Medicine) [name], MD as PCP - Cardiology (Cardiology) [name], MD as PCP - Electrophysiology (Cardiology) [name], MD as Consulting Physician (Hematology and Oncology)  DIAGNOSIS:  Encounter Diagnosis  Name Primary?   Anemia of chronic disease Yes   CHIEF COMPLIANT: Follow-up of anemia chronic disease  HISTORY OF PRESENT ILLNESS: History of Present Illness Joshua Hudson is an 89 year old male who presents for a follow-up regarding his anemia management.  He has significant fatigue that confines him to his house and limits daily activities such as cooking, which his son now performs. His current hemoglobin is 10.6 g/dL.     ALLERGIES:  has no known allergies.  MEDICATIONS:  Current Outpatient Medications  Medication Sig Dispense Refill   cholestyramine (QUESTRAN) 4 g packet Take 4 g by mouth daily.     CREON 24000-76000 units CPEP as directed Orally three times a day     Cyanocobalamin  (B-12) 50 MCG TABS      ELIQUIS  2.5 MG TABS tablet TAKE 1 TABLET(2.5 MG) BY MOUTH TWICE DAILY 180 tablet 1   epoetin  alfa-epbx (RETACRIT ) 40000 UNIT/ML injection Inject 40,000 Units into the skin every 6 (six) weeks. As needed     finasteride  (PROSCAR ) 5 MG tablet Take 5 mg by mouth daily.     fluticasone (FLONASE) 50 MCG/ACT nasal spray Place 1 spray into both nostrils daily as needed for allergies or rhinitis.     JARDIANCE  10 MG TABS tablet TAKE 1 TABLET(10 MG) BY MOUTH DAILY 90 tablet 3   ketotifen  (ZADITOR ) 0.025 % ophthalmic solution Place 1 drop into both eyes 2 (two) times daily as needed (itchy eyes).     levothyroxine  (SYNTHROID ) 88 MCG tablet Take 88 mcg by mouth every morning.     pantoprazole  (PROTONIX ) 40 MG tablet Take 1 tablet (40 mg total) by mouth 2 (two) times daily before a meal for 30 days, THEN 1 tablet (40 mg total) daily. 120 tablet 0   potassium chloride  SA (KLOR-CON  M) 20 MEQ  tablet Take 1 tablet (20 mEq total) by mouth daily. 90 tablet 3   predniSONE  (DELTASONE ) 5 MG tablet Take 5 mg by mouth daily with breakfast. TAKING 15mg  QD FOR 3Wks     torsemide  (DEMADEX ) 20 MG tablet TAKE 2 TABLETS BY MOUTH EVERY MORNING AND 1 TABLET IN THE EVENING 270 tablet 3   No current facility-administered medications for this visit.    PHYSICAL EXAMINATION: ECOG PERFORMANCE STATUS: 1 - Symptomatic but completely ambulatory  Vitals:   07/30/24 1009  BP: 109/85  Pulse: 86  Resp: 16  Temp: 98 F (36.7 C)  SpO2: 99%   Filed Weights   07/30/24 1009  Weight: 117 lb 14.4 oz (53.5 kg)    LABORATORY DATA:  I have reviewed the data as listed    Latest Ref Rng & Units 07/09/2024   10:23 AM 06/18/2024    1:42 PM 05/28/2024    8:04 AM  CMP  Glucose 70 - 99 mg/dL 869  865  897   BUN 8 - 23 mg/dL 16  27  19    Creatinine 0.61 - 1.24 mg/dL 8.44  8.32  8.54   Sodium 135 - 145 mmol/L 139  139  140   Potassium 3.5 - 5.1 mmol/L 4.2  3.4  3.6   Chloride 98 - 111 mmol/L 103  103  104   CO2 22 - 32  mmol/L 29  28  30    Calcium 8.9 - 10.3 mg/dL 8.8  9.3  8.6   Total Protein 6.5 - 8.1 g/dL 6.1  6.1  5.9   Total Bilirubin 0.0 - 1.2 mg/dL 0.6  0.6  0.5   Alkaline Phos 38 - 126 U/L 63  70  50   AST 15 - 41 U/L 22  18  18    ALT 0 - 44 U/L 11  10  9      Lab Results  Component Value Date   WBC 10.9 (H) 07/09/2024   HGB 10.3 (L) 07/09/2024   HCT 33.4 (L) 07/09/2024   MCV 67.6 (L) 07/09/2024   PLT 186 07/09/2024   NEUTROABS 9.3 (H) 07/09/2024    ASSESSMENT & PLAN:  Anemia of chronic disease Hemoglobin electrophoresis: Beta thalassemia minor.    Hospitalization 09/22/2019-09/25/2019: A. fib with RVR status post cardioversion, currently on Eliquis  Hospitalization 02/28/2022-03/11/2022: Atrial fibrillation: Cardioversion planned for 03/22/2022. 05/12/22: Ablation and Pacemaker Implantation  06/12/2023: EGD with Botox    Lab review: 09/25/2019: WBC 7.2, hemoglobin 9.6, platelets 145 04/14/22:  Hb 7.8, Platelets 91 (PRBC) 06/14/22: Hb 10.4, Pl 151, Cr 1.66, Iron  sat: 16%, Ferritin 287 07/05/22: Hemoglobin 9.7 08/14/22: Hemoglobin 7.4 09/26/2022: Hemoglobin 9.1 (Retacrit  injection today) 04/12/2023: Hemoglobin 10.4 (Retacrit  and starting B12 injections) 05/24/2023: Hemoglobin 9.9 (Retacrit , B12 and IV iron ) 07/05/2023: Hemoglobin 10.8 08/08/2023: Hemoglobin 11.2 09/21/2023: Hemoglobin 11.1 (Retacrit  held) 10/11/2023: Hemoglobin 10.7 12/27/2023: Hemoglobin 10.1 (getting B12 and Retacrit ) 02/28/2024: Hemoglobin 9.4 04/14/2024: Hemoglobin 8.1 (1 unit PRBC) 05/28/2024: Hemoglobin 10.6  07/30/2024: Hemoglobin 10.6   IV iron : March 2024, September 2024, August 2025   Current treatment:  Retacrit  40,000 units every 3 weeks.  I recommended proceeding with today's injection because if he did not do the shots his hemoglobin would drop significantly. Recheck labs in 3 weeks      He will come back again in 3 weeks for lab check and in 9 weeks for follow-up with me.  No orders of the defined types were placed in this encounter.  The patient has a good understanding of the overall plan. he agrees with it. he will call with any problems that may develop before the next visit here.  I personally spent a total of 30 minutes in the care of the patient today including preparing to see the patient, getting/reviewing separately obtained history, performing a medically appropriate exam/evaluation, counseling and educating, placing orders, referring and communicating with other health care professionals, documenting clinical information in the EHR, independently interpreting results, communicating results, and coordinating care.   Viinay K Patrina Andreas, MD 07/30/24

## 2024-08-01 ENCOUNTER — Telehealth: Payer: Self-pay | Admitting: Hematology and Oncology

## 2024-08-01 NOTE — Telephone Encounter (Signed)
 I spoke with patient's daughter to confirm dates/times of future scheduled lab/injection appointments. Patient and daughter aware.

## 2024-08-08 NOTE — Telephone Encounter (Signed)
 Call to patient to review echo results, spoke with dtr Patty (D[facility]). Reviewed that 2D echo showed normal LVF, increased stiffness of heart muscle called diastolic dysfunction, severe enlargement of the atria, moderately leaky MV and stable TAVR. Patty agrees to repeat echo in 1 year for MR, all questions answered.

## 2024-08-08 NOTE — Telephone Encounter (Signed)
-----   Message from Joshua Hudson sent at 07/29/2024  2:59 PM EST ----- 2D echo showed normal LVF, increased stiffness of heart muscle called diastolic dysfunction, severe enlargement of the atria, moderately leaky MV and stable TAVR.  Repeat echo in 1 year for MR ----- Message ----- From: Interface, [employer] Sent: 07/29/2024  12:10 PM EST To: Joshua JONELLE Bihari, MD

## 2024-08-20 ENCOUNTER — Inpatient Hospital Stay

## 2024-08-20 ENCOUNTER — Inpatient Hospital Stay: Attending: Hematology and Oncology

## 2024-08-20 DIAGNOSIS — D61818 Other pancytopenia: Secondary | ICD-10-CM | POA: Insufficient documentation

## 2024-08-20 DIAGNOSIS — D563 Thalassemia minor: Secondary | ICD-10-CM | POA: Diagnosis present

## 2024-08-20 DIAGNOSIS — D638 Anemia in other chronic diseases classified elsewhere: Secondary | ICD-10-CM | POA: Diagnosis present

## 2024-08-20 DIAGNOSIS — D696 Thrombocytopenia, unspecified: Secondary | ICD-10-CM

## 2024-08-20 LAB — CMP (CANCER CENTER ONLY)
ALT: 15 U/L (ref 0–44)
AST: 29 U/L (ref 15–41)
Albumin: 4.2 g/dL (ref 3.5–5.0)
Alkaline Phosphatase: 87 U/L (ref 38–126)
Anion gap: 12 (ref 5–15)
BUN: 18 mg/dL (ref 8–23)
CO2: 28 mmol/L (ref 22–32)
Calcium: 9.4 mg/dL (ref 8.9–10.3)
Chloride: 100 mmol/L (ref 98–111)
Creatinine: 1.73 mg/dL — ABNORMAL HIGH (ref 0.61–1.24)
GFR, Estimated: 38 mL/min — ABNORMAL LOW (ref >60)
Glucose, Bld: 127 mg/dL — ABNORMAL HIGH (ref 70–99)
Potassium: 4.6 mmol/L (ref 3.5–5.1)
Sodium: 140 mmol/L (ref 135–145)
Total Bilirubin: 0.5 mg/dL (ref 0.0–1.2)
Total Protein: 6.7 g/dL (ref 6.5–8.1)

## 2024-08-20 LAB — CBC WITH DIFFERENTIAL (CANCER CENTER ONLY)
Abs Immature Granulocytes: 0.07 K/uL (ref 0.00–0.07)
Basophils Absolute: 0 K/uL (ref 0.0–0.1)
Basophils Relative: 0 %
Eosinophils Absolute: 0.1 K/uL (ref 0.0–0.5)
Eosinophils Relative: 1 %
HCT: 38.6 % — ABNORMAL LOW (ref 39.0–52.0)
Hemoglobin: 11.9 g/dL — ABNORMAL LOW (ref 13.0–17.0)
Immature Granulocytes: 1 %
Lymphocytes Relative: 10 %
Lymphs Abs: 1.1 K/uL (ref 0.7–4.0)
MCH: 20.9 pg — ABNORMAL LOW (ref 26.0–34.0)
MCHC: 30.8 g/dL (ref 30.0–36.0)
MCV: 67.8 fL — ABNORMAL LOW (ref 80.0–100.0)
Monocytes Absolute: 0.4 K/uL (ref 0.1–1.0)
Monocytes Relative: 4 %
Neutro Abs: 9.1 K/uL — ABNORMAL HIGH (ref 1.7–7.7)
Neutrophils Relative %: 84 %
Platelet Count: 169 K/uL (ref 150–400)
RBC: 5.69 MIL/uL (ref 4.22–5.81)
RDW: 16.5 % — ABNORMAL HIGH (ref 11.5–15.5)
WBC Count: 10.8 K/uL — ABNORMAL HIGH (ref 4.0–10.5)
nRBC: 0 % (ref 0.0–0.2)

## 2024-08-20 MED ORDER — CYANOCOBALAMIN 1000 MCG/ML IJ SOLN
1000.0000 ug | Freq: Once | INTRAMUSCULAR | Status: AC
  Administered 2024-08-20: 11:00:00 1000 ug via INTRAMUSCULAR
  Filled 2024-08-20: qty 1

## 2024-09-08 ENCOUNTER — Telehealth: Payer: Self-pay | Admitting: Hematology and Oncology

## 2024-09-10 ENCOUNTER — Inpatient Hospital Stay: Attending: Hematology and Oncology

## 2024-09-10 ENCOUNTER — Inpatient Hospital Stay

## 2024-09-10 VITALS — BP 142/77 | HR 89 | Temp 98.8°F | Resp 17

## 2024-09-10 DIAGNOSIS — D638 Anemia in other chronic diseases classified elsewhere: Secondary | ICD-10-CM | POA: Insufficient documentation

## 2024-09-10 DIAGNOSIS — Z7901 Long term (current) use of anticoagulants: Secondary | ICD-10-CM | POA: Insufficient documentation

## 2024-09-10 DIAGNOSIS — D696 Thrombocytopenia, unspecified: Secondary | ICD-10-CM

## 2024-09-10 DIAGNOSIS — D61818 Other pancytopenia: Secondary | ICD-10-CM | POA: Insufficient documentation

## 2024-09-10 DIAGNOSIS — D563 Thalassemia minor: Secondary | ICD-10-CM | POA: Insufficient documentation

## 2024-09-10 DIAGNOSIS — I4891 Unspecified atrial fibrillation: Secondary | ICD-10-CM | POA: Insufficient documentation

## 2024-09-10 LAB — CMP (CANCER CENTER ONLY)
ALT: 10 U/L (ref 0–44)
AST: 23 U/L (ref 15–41)
Albumin: 4.1 g/dL (ref 3.5–5.0)
Alkaline Phosphatase: 83 U/L (ref 38–126)
Anion gap: 10 (ref 5–15)
BUN: 20 mg/dL (ref 8–23)
CO2: 28 mmol/L (ref 22–32)
Calcium: 8.8 mg/dL — ABNORMAL LOW (ref 8.9–10.3)
Chloride: 100 mmol/L (ref 98–111)
Creatinine: 1.72 mg/dL — ABNORMAL HIGH (ref 0.61–1.24)
GFR, Estimated: 38 mL/min — ABNORMAL LOW
Glucose, Bld: 122 mg/dL — ABNORMAL HIGH (ref 70–99)
Potassium: 3.8 mmol/L (ref 3.5–5.1)
Sodium: 138 mmol/L (ref 135–145)
Total Bilirubin: 0.6 mg/dL (ref 0.0–1.2)
Total Protein: 6.3 g/dL — ABNORMAL LOW (ref 6.5–8.1)

## 2024-09-10 LAB — CBC WITH DIFFERENTIAL (CANCER CENTER ONLY)
Abs Immature Granulocytes: 0.05 K/uL (ref 0.00–0.07)
Basophils Absolute: 0 K/uL (ref 0.0–0.1)
Basophils Relative: 0 %
Eosinophils Absolute: 0.1 K/uL (ref 0.0–0.5)
Eosinophils Relative: 1 %
HCT: 34.1 % — ABNORMAL LOW (ref 39.0–52.0)
Hemoglobin: 10.7 g/dL — ABNORMAL LOW (ref 13.0–17.0)
Immature Granulocytes: 1 %
Lymphocytes Relative: 8 %
Lymphs Abs: 0.7 K/uL (ref 0.7–4.0)
MCH: 21.1 pg — ABNORMAL LOW (ref 26.0–34.0)
MCHC: 31.4 g/dL (ref 30.0–36.0)
MCV: 67.4 fL — ABNORMAL LOW (ref 80.0–100.0)
Monocytes Absolute: 0.5 K/uL (ref 0.1–1.0)
Monocytes Relative: 6 %
Neutro Abs: 7.8 K/uL — ABNORMAL HIGH (ref 1.7–7.7)
Neutrophils Relative %: 84 %
Platelet Count: 158 K/uL (ref 150–400)
RBC: 5.06 MIL/uL (ref 4.22–5.81)
RDW: 16.4 % — ABNORMAL HIGH (ref 11.5–15.5)
WBC Count: 9.2 K/uL (ref 4.0–10.5)
nRBC: 0 % (ref 0.0–0.2)

## 2024-09-10 MED ORDER — CYANOCOBALAMIN 1000 MCG/ML IJ SOLN
1000.0000 ug | Freq: Once | INTRAMUSCULAR | Status: AC
  Administered 2024-09-10: 1000 ug via INTRAMUSCULAR
  Filled 2024-09-10: qty 1

## 2024-09-10 MED ORDER — EPOETIN ALFA-EPBX 40000 UNIT/ML IJ SOLN
40000.0000 [IU] | Freq: Once | INTRAMUSCULAR | Status: AC
  Administered 2024-09-10: 40000 [IU] via SUBCUTANEOUS
  Filled 2024-09-10: qty 1

## 2024-09-11 ENCOUNTER — Ambulatory Visit: Payer: Medicare PPO

## 2024-09-11 DIAGNOSIS — I480 Paroxysmal atrial fibrillation: Secondary | ICD-10-CM | POA: Diagnosis not present

## 2024-09-12 LAB — CUP PACEART REMOTE DEVICE CHECK
Battery Remaining Longevity: 122 mo
Battery Voltage: 3.01 V
Brady Statistic AP VP Percent: 70.98 %
Brady Statistic AP VS Percent: 0 %
Brady Statistic AS VP Percent: 28.74 %
Brady Statistic AS VS Percent: 0.23 %
Brady Statistic RA Percent Paced: 49.78 %
Brady Statistic RV Percent Paced: 99.72 %
Date Time Interrogation Session: 20260108050141
Implantable Lead Connection Status: 753985
Implantable Lead Connection Status: 753985
Implantable Lead Implant Date: 20230908
Implantable Lead Implant Date: 20230908
Implantable Lead Location: 753859
Implantable Lead Location: 753860
Implantable Lead Model: 3830
Implantable Lead Model: 5076
Implantable Pulse Generator Implant Date: 20230908
Lead Channel Impedance Value: 285 Ohm
Lead Channel Impedance Value: 304 Ohm
Lead Channel Impedance Value: 361 Ohm
Lead Channel Impedance Value: 456 Ohm
Lead Channel Pacing Threshold Amplitude: 0.75 V
Lead Channel Pacing Threshold Amplitude: 0.875 V
Lead Channel Pacing Threshold Pulse Width: 0.4 ms
Lead Channel Pacing Threshold Pulse Width: 0.4 ms
Lead Channel Sensing Intrinsic Amplitude: 1.75 mV
Lead Channel Sensing Intrinsic Amplitude: 1.75 mV
Lead Channel Sensing Intrinsic Amplitude: 16.625 mV
Lead Channel Sensing Intrinsic Amplitude: 16.625 mV
Lead Channel Setting Pacing Amplitude: 2 V
Lead Channel Setting Pacing Amplitude: 2.75 V
Lead Channel Setting Pacing Pulse Width: 0.4 ms
Lead Channel Setting Sensing Sensitivity: 1.2 mV
Zone Setting Status: 755011

## 2024-09-13 ENCOUNTER — Ambulatory Visit: Payer: Self-pay | Admitting: Cardiovascular Disease

## 2024-09-16 NOTE — Progress Notes (Signed)
 Remote PPM Transmission

## 2024-09-30 ENCOUNTER — Other Ambulatory Visit: Payer: Self-pay

## 2024-09-30 DIAGNOSIS — D638 Anemia in other chronic diseases classified elsewhere: Secondary | ICD-10-CM

## 2024-10-01 ENCOUNTER — Inpatient Hospital Stay

## 2024-10-01 ENCOUNTER — Inpatient Hospital Stay: Admitting: Hematology and Oncology

## 2024-10-01 VITALS — BP 136/85 | HR 85 | Temp 97.9°F | Resp 18 | Ht 63.0 in | Wt 118.0 lb

## 2024-10-01 DIAGNOSIS — D638 Anemia in other chronic diseases classified elsewhere: Secondary | ICD-10-CM

## 2024-10-01 LAB — CMP (CANCER CENTER ONLY)
ALT: 11 U/L (ref 0–44)
AST: 25 U/L (ref 15–41)
Albumin: 4.3 g/dL (ref 3.5–5.0)
Alkaline Phosphatase: 93 U/L (ref 38–126)
Anion gap: 12 (ref 5–15)
BUN: 21 mg/dL (ref 8–23)
CO2: 29 mmol/L (ref 22–32)
Calcium: 9.5 mg/dL (ref 8.9–10.3)
Chloride: 99 mmol/L (ref 98–111)
Creatinine: 1.76 mg/dL — ABNORMAL HIGH (ref 0.61–1.24)
GFR, Estimated: 37 mL/min — ABNORMAL LOW
Glucose, Bld: 133 mg/dL — ABNORMAL HIGH (ref 70–99)
Potassium: 4.2 mmol/L (ref 3.5–5.1)
Sodium: 139 mmol/L (ref 135–145)
Total Bilirubin: 0.6 mg/dL (ref 0.0–1.2)
Total Protein: 6.6 g/dL (ref 6.5–8.1)

## 2024-10-01 LAB — CBC WITH DIFFERENTIAL (CANCER CENTER ONLY)
Abs Immature Granulocytes: 0.05 10*3/uL (ref 0.00–0.07)
Basophils Absolute: 0 10*3/uL (ref 0.0–0.1)
Basophils Relative: 0 %
Eosinophils Absolute: 0.1 10*3/uL (ref 0.0–0.5)
Eosinophils Relative: 1 %
HCT: 36.6 % — ABNORMAL LOW (ref 39.0–52.0)
Hemoglobin: 11.4 g/dL — ABNORMAL LOW (ref 13.0–17.0)
Immature Granulocytes: 1 %
Lymphocytes Relative: 9 %
Lymphs Abs: 0.9 10*3/uL (ref 0.7–4.0)
MCH: 20.9 pg — ABNORMAL LOW (ref 26.0–34.0)
MCHC: 31.1 g/dL (ref 30.0–36.0)
MCV: 67.2 fL — ABNORMAL LOW (ref 80.0–100.0)
Monocytes Absolute: 0.5 10*3/uL (ref 0.1–1.0)
Monocytes Relative: 5 %
Neutro Abs: 8.5 10*3/uL — ABNORMAL HIGH (ref 1.7–7.7)
Neutrophils Relative %: 84 %
Platelet Count: 163 10*3/uL (ref 150–400)
RBC: 5.45 MIL/uL (ref 4.22–5.81)
RDW: 16.8 % — ABNORMAL HIGH (ref 11.5–15.5)
WBC Count: 10 10*3/uL (ref 4.0–10.5)
nRBC: 0.2 % (ref 0.0–0.2)

## 2024-10-01 NOTE — Assessment & Plan Note (Signed)
 Hemoglobin electrophoresis: Beta thalassemia minor.    Hospitalization 09/22/2019-09/25/2019: A. fib with RVR status post cardioversion, currently on Eliquis  Hospitalization 02/28/2022-03/11/2022: Atrial fibrillation: Cardioversion planned for 03/22/2022. 05/12/22: Ablation and Pacemaker Implantation  06/12/2023: EGD with Botox    Lab review: 09/25/2019: WBC 7.2, hemoglobin 9.6, platelets 145 04/14/22: Hb 7.8, Platelets 91 (PRBC) 06/14/22: Hb 10.4, Pl 151, Cr 1.66, Iron  sat: 16%, Ferritin 287 08/14/22: Hemoglobin 7.4 09/26/2022: Hemoglobin 9.1 (Retacrit  injection today) 04/12/2023: Hemoglobin 10.4 (Retacrit  and starting B12 injections) 05/24/2023: Hemoglobin 9.9 (Retacrit , B12 and IV iron ) 09/21/2023: Hemoglobin 11.1 (Retacrit  held) 10/11/2023: Hemoglobin 10.7 12/27/2023: Hemoglobin 10.1 (getting B12 and Retacrit ) 02/28/2024: Hemoglobin 9.4 04/14/2024: Hemoglobin 8.1 (1 unit PRBC) 07/30/2024: Hemoglobin 10.6 10/01/2024:   IV iron : March 2024, September 2024, August 2025   Current treatment:  Retacrit  40,000 units every 3 weeks.  I recommended proceeding with today's injection because if he did not do the shots his hemoglobin would drop significantly. Recheck labs in 3 weeks      He will come back again in 3 weeks for lab check and in 9 weeks for follow-up with me.

## 2024-10-01 NOTE — Progress Notes (Signed)
 "  Patient Care Team: [name], MD as PCP - General (Family Medicine) [name], MD as PCP - Cardiology (Cardiology) [name], MD as PCP - Electrophysiology (Cardiology) [name], MD as Consulting Physician (Hematology and Oncology)  DIAGNOSIS:  Encounter Diagnosis  Name Primary?   Anemia of chronic disease Yes    CHIEF COMPLIANT: Follow-up of anemia of chronic disease on Retacrit   HISTORY OF PRESENT ILLNESS: History of Present Illness Joshua Hudson is an 89 year old male with anemia of chronic disease and beta thalassemia minor who presents for hematology follow-up to monitor hemoglobin response to ongoing therapy.  He is on iron  supplementation and periodic erythropoiesis-stimulating agent injections. Hemoglobin is improving, with today's value 11.4 g/dL compared to 89.0 g/dL and 88.0 g/dL on prior visits. He is pleased with the response and notes hemoglobin has not been this high in years.  He has no new or worsening anemia symptoms, including fatigue or dyspnea.       ALLERGIES:  has no known allergies.  MEDICATIONS:  Current Outpatient Medications  Medication Sig Dispense Refill   cholestyramine (QUESTRAN) 4 g packet Take 4 g by mouth daily.     CREON 24000-76000 units CPEP as directed Orally three times a day     Cyanocobalamin  (B-12) 50 MCG TABS      ELIQUIS  2.5 MG TABS tablet TAKE 1 TABLET(2.5 MG) BY MOUTH TWICE DAILY 180 tablet 1   epoetin  alfa-epbx (RETACRIT ) 40000 UNIT/ML injection Inject 40,000 Units into the skin every 6 (six) weeks. As needed     finasteride  (PROSCAR ) 5 MG tablet Take 5 mg by mouth daily.     fluticasone (FLONASE) 50 MCG/ACT nasal spray Place 1 spray into both nostrils daily as needed for allergies or rhinitis.     JARDIANCE  10 MG TABS tablet TAKE 1 TABLET(10 MG) BY MOUTH DAILY 90 tablet 3   ketotifen  (ZADITOR ) 0.025 % ophthalmic solution Place 1 drop into both eyes 2 (two) times daily as needed (itchy eyes).      levothyroxine  (SYNTHROID ) 88 MCG tablet Take 88 mcg by mouth every morning.     pantoprazole  (PROTONIX ) 40 MG tablet Take 1 tablet (40 mg total) by mouth 2 (two) times daily before a meal for 30 days, THEN 1 tablet (40 mg total) daily. 120 tablet 0   potassium chloride  SA (KLOR-CON  M) 20 MEQ tablet Take 1 tablet (20 mEq total) by mouth daily. 90 tablet 3   predniSONE  (DELTASONE ) 5 MG tablet Take 5 mg by mouth daily with breakfast. TAKING 15mg  QD FOR 3Wks     torsemide  (DEMADEX ) 20 MG tablet TAKE 2 TABLETS BY MOUTH EVERY MORNING AND 1 TABLET IN THE EVENING 270 tablet 3   No current facility-administered medications for this visit.    PHYSICAL EXAMINATION: ECOG PERFORMANCE STATUS: 1 - Symptomatic but completely ambulatory  Vitals:   10/01/24 1039  BP: 136/85  Pulse: 85  Resp: 18  Temp: 97.9 F (36.6 C)  SpO2: 99%   Filed Weights   10/01/24 1039  Weight: 118 lb (53.5 kg)      LABORATORY DATA:  I have reviewed the data as listed    Latest Ref Rng & Units 10/01/2024   10:17 AM 09/10/2024   11:09 AM 08/20/2024    9:54 AM  CMP  Glucose 70 - 99 mg/dL 866  877  872   BUN 8 - 23 mg/dL 21  20  18    Creatinine 0.61 - 1.24 mg/dL 8.23  1.72  1.73   Sodium 135 - 145 mmol/L 139  138  140   Potassium 3.5 - 5.1 mmol/L 4.2  3.8  4.6   Chloride 98 - 111 mmol/L 99  100  100   CO2 22 - 32 mmol/L 29  28  28    Calcium 8.9 - 10.3 mg/dL 9.5  8.8  9.4   Total Protein 6.5 - 8.1 g/dL 6.6  6.3  6.7   Total Bilirubin 0.0 - 1.2 mg/dL 0.6  0.6  0.5   Alkaline Phos 38 - 126 U/L 93  83  87   AST 15 - 41 U/L 25  23  29    ALT 0 - 44 U/L 11  10  15      Lab Results  Component Value Date   WBC 10.0 10/01/2024   HGB 11.4 (L) 10/01/2024   HCT 36.6 (L) 10/01/2024   MCV 67.2 (L) 10/01/2024   PLT 163 10/01/2024   NEUTROABS 8.5 (H) 10/01/2024    ASSESSMENT & PLAN:  Anemia of chronic disease Hemoglobin electrophoresis: Beta thalassemia minor.    Hospitalization 09/22/2019-09/25/2019: A. fib with RVR  status post cardioversion, currently on Eliquis  Hospitalization 02/28/2022-03/11/2022: Atrial fibrillation: Cardioversion planned for 03/22/2022. 05/12/22: Ablation and Pacemaker Implantation  06/12/2023: EGD with Botox    Lab review: 09/25/2019: WBC 7.2, hemoglobin 9.6, platelets 145 04/14/22: Hb 7.8, Platelets 91 (PRBC) 06/14/22: Hb 10.4, Pl 151, Cr 1.66, Iron  sat: 16%, Ferritin 287 08/14/22: Hemoglobin 7.4 09/26/2022: Hemoglobin 9.1 (Retacrit  injection today) 04/12/2023: Hemoglobin 10.4 (Retacrit  and starting B12 injections) 05/24/2023: Hemoglobin 9.9 (Retacrit , B12 and IV iron ) 09/21/2023: Hemoglobin 11.1 (Retacrit  held) 10/11/2023: Hemoglobin 10.7 12/27/2023: Hemoglobin 10.1 (getting B12 and Retacrit ) 02/28/2024: Hemoglobin 9.4 04/14/2024: Hemoglobin 8.1 (1 unit PRBC) 07/30/2024: Hemoglobin 10.6 10/01/2024: Hemoglobin 11.4 (holding Retacrit )   IV iron : March 2024, September 2024, August 2025   Current treatment:  Retacrit  40,000 units every 4 weeks.  I recommended proceeding with today's injection because if he did not do the shots his hemoglobin would drop significantly. Recheck labs in 4 weeks      He will come back again in 4 weeks for lab check and in 12 weeks for follow-up with me.    No orders of the defined types were placed in this encounter.  The patient has a good understanding of the overall plan. he agrees with it. he will call with any problems that may develop before the next visit here.  I personally spent a total of 30 minutes in the care of the patient today including preparing to see the patient, getting/reviewing separately obtained history, performing a medically appropriate exam/evaluation, counseling and educating, placing orders, referring and communicating with other health care professionals, documenting clinical information in the EHR, independently interpreting results, communicating results, and coordinating care.   Dr.Kyndell Zeiser 10/01/24    "

## 2024-10-29 ENCOUNTER — Inpatient Hospital Stay: Attending: Hematology and Oncology

## 2024-10-29 ENCOUNTER — Inpatient Hospital Stay

## 2024-11-26 ENCOUNTER — Inpatient Hospital Stay: Attending: Hematology and Oncology

## 2024-11-26 ENCOUNTER — Inpatient Hospital Stay

## 2024-12-24 ENCOUNTER — Inpatient Hospital Stay: Attending: Hematology and Oncology

## 2024-12-24 ENCOUNTER — Inpatient Hospital Stay

## 2024-12-24 ENCOUNTER — Inpatient Hospital Stay: Admitting: Hematology and Oncology
